# Patient Record
Sex: Female | Born: 1950 | Race: Black or African American | Hispanic: No | Marital: Single
Health system: Midwestern US, Community
[De-identification: ages and names within clinical notes are randomized; demographics above are authoritative.]

## PROBLEM LIST (undated history)

## (undated) DIAGNOSIS — M869 Osteomyelitis, unspecified: Principal | ICD-10-CM

## (undated) DIAGNOSIS — I2510 Atherosclerotic heart disease of native coronary artery without angina pectoris: Secondary | ICD-10-CM

## (undated) DIAGNOSIS — G459 Transient cerebral ischemic attack, unspecified: Secondary | ICD-10-CM

## (undated) DIAGNOSIS — E1140 Type 2 diabetes mellitus with diabetic neuropathy, unspecified: Secondary | ICD-10-CM

## (undated) DIAGNOSIS — T8859XA Other complications of anesthesia, initial encounter: Secondary | ICD-10-CM

## (undated) DIAGNOSIS — F419 Anxiety disorder, unspecified: Secondary | ICD-10-CM

## (undated) DIAGNOSIS — K649 Unspecified hemorrhoids: Secondary | ICD-10-CM

## (undated) DIAGNOSIS — H269 Unspecified cataract: Secondary | ICD-10-CM

## (undated) DIAGNOSIS — IMO0002 Reserved for concepts with insufficient information to code with codable children: Secondary | ICD-10-CM

## (undated) DIAGNOSIS — E785 Hyperlipidemia, unspecified: Secondary | ICD-10-CM

## (undated) DIAGNOSIS — T7840XA Allergy, unspecified, initial encounter: Secondary | ICD-10-CM

## (undated) DIAGNOSIS — T4145XA Adverse effect of unspecified anesthetic, initial encounter: Secondary | ICD-10-CM

## (undated) DIAGNOSIS — F32A Depression, unspecified: Secondary | ICD-10-CM

## (undated) DIAGNOSIS — J9809 Other diseases of bronchus, not elsewhere classified: Secondary | ICD-10-CM

## (undated) DIAGNOSIS — C8590 Non-Hodgkin lymphoma, unspecified, unspecified site: Secondary | ICD-10-CM

## (undated) DIAGNOSIS — K219 Gastro-esophageal reflux disease without esophagitis: Secondary | ICD-10-CM

## (undated) DIAGNOSIS — F329 Major depressive disorder, single episode, unspecified: Secondary | ICD-10-CM

## (undated) DIAGNOSIS — E119 Type 2 diabetes mellitus without complications: Secondary | ICD-10-CM

## (undated) DIAGNOSIS — E1121 Type 2 diabetes mellitus with diabetic nephropathy: Secondary | ICD-10-CM

## (undated) DIAGNOSIS — Z89432 Acquired absence of left foot: Secondary | ICD-10-CM

## (undated) DIAGNOSIS — I10 Essential (primary) hypertension: Secondary | ICD-10-CM

## (undated) DIAGNOSIS — G8929 Other chronic pain: Secondary | ICD-10-CM

## (undated) DIAGNOSIS — G8918 Other acute postprocedural pain: Secondary | ICD-10-CM

## (undated) DIAGNOSIS — M25562 Pain in left knee: Secondary | ICD-10-CM

## (undated) DIAGNOSIS — D497 Neoplasm of unspecified behavior of endocrine glands and other parts of nervous system: Secondary | ICD-10-CM

## (undated) DIAGNOSIS — I38 Endocarditis, valve unspecified: Secondary | ICD-10-CM

## (undated) HISTORY — PX: PLANTAR FASCIA RELEASE: SHX2239

## (undated) HISTORY — DX: Gastro-esophageal reflux disease without esophagitis: K21.9

## (undated) HISTORY — DX: Unspecified hemorrhoids: K64.9

## (undated) HISTORY — DX: Anxiety disorder, unspecified: F41.9

## (undated) HISTORY — DX: Hyperlipidemia, unspecified: E78.5

## (undated) HISTORY — PX: FOOT NEUROMA SURGERY: SHX646

## (undated) HISTORY — DX: Reserved for concepts with insufficient information to code with codable children: IMO0002

## (undated) HISTORY — PX: OTHER SURGICAL HISTORY: SHX169

## (undated) HISTORY — DX: Depression, unspecified: F32.A

## (undated) HISTORY — DX: Major depressive disorder, single episode, unspecified: F32.9

## (undated) HISTORY — DX: Allergy, unspecified, initial encounter: T78.40XA

## (undated) HISTORY — DX: Unspecified cataract: H26.9

## (undated) HISTORY — DX: Non-Hodgkin lymphoma, unspecified, unspecified site: C85.90

## (undated) HISTORY — PX: UPPER GI ENDOSCOPY: SHX6162

---
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
## 1991-09-20 HISTORY — PX: ATRIAL ABLATION SURGERY: SHX560

## 1995-09-20 HISTORY — PX: ATRIAL ABLATION SURGERY: SHX560

## 1997-11-14 ENCOUNTER — Ambulatory Visit (HOSPITAL_COMMUNITY): Admission: RE | Admit: 1997-11-14 | Discharge: 1997-11-14 | Payer: Self-pay | Admitting: Obstetrics and Gynecology

## 1998-12-03 ENCOUNTER — Other Ambulatory Visit: Admission: RE | Admit: 1998-12-03 | Discharge: 1998-12-03 | Payer: Self-pay | Admitting: Obstetrics and Gynecology

## 1999-12-03 ENCOUNTER — Other Ambulatory Visit: Admission: RE | Admit: 1999-12-03 | Discharge: 1999-12-03 | Payer: Self-pay | Admitting: Obstetrics and Gynecology

## 2000-01-06 ENCOUNTER — Encounter: Payer: Self-pay | Admitting: Obstetrics and Gynecology

## 2000-01-06 ENCOUNTER — Ambulatory Visit (HOSPITAL_COMMUNITY): Admission: RE | Admit: 2000-01-06 | Discharge: 2000-01-06 | Payer: Self-pay | Admitting: Obstetrics and Gynecology

## 2000-12-18 ENCOUNTER — Other Ambulatory Visit: Admission: RE | Admit: 2000-12-18 | Discharge: 2000-12-18 | Payer: Self-pay | Admitting: Obstetrics and Gynecology

## 2001-09-14 ENCOUNTER — Ambulatory Visit (HOSPITAL_COMMUNITY): Admission: RE | Admit: 2001-09-14 | Discharge: 2001-09-14 | Payer: Self-pay | Admitting: Obstetrics and Gynecology

## 2001-09-14 ENCOUNTER — Encounter: Payer: Self-pay | Admitting: Obstetrics and Gynecology

## 2001-12-24 ENCOUNTER — Other Ambulatory Visit: Admission: RE | Admit: 2001-12-24 | Discharge: 2001-12-24 | Payer: Self-pay | Admitting: Obstetrics and Gynecology

## 2002-09-18 ENCOUNTER — Encounter: Payer: Self-pay | Admitting: Obstetrics and Gynecology

## 2002-09-18 ENCOUNTER — Ambulatory Visit (HOSPITAL_COMMUNITY): Admission: RE | Admit: 2002-09-18 | Discharge: 2002-09-18 | Payer: Self-pay | Admitting: Obstetrics and Gynecology

## 2002-10-07 ENCOUNTER — Other Ambulatory Visit: Admission: RE | Admit: 2002-10-07 | Discharge: 2002-10-07 | Payer: Self-pay | Admitting: Obstetrics and Gynecology

## 2002-10-09 ENCOUNTER — Encounter: Payer: Self-pay | Admitting: Obstetrics and Gynecology

## 2002-10-09 ENCOUNTER — Encounter: Admission: RE | Admit: 2002-10-09 | Discharge: 2002-10-09 | Payer: Self-pay | Admitting: Obstetrics and Gynecology

## 2003-09-29 ENCOUNTER — Ambulatory Visit (HOSPITAL_COMMUNITY): Admission: RE | Admit: 2003-09-29 | Discharge: 2003-09-29 | Payer: Self-pay | Admitting: Obstetrics and Gynecology

## 2003-10-14 ENCOUNTER — Other Ambulatory Visit: Admission: RE | Admit: 2003-10-14 | Discharge: 2003-10-14 | Payer: Self-pay | Admitting: Obstetrics and Gynecology

## 2004-10-04 ENCOUNTER — Ambulatory Visit (HOSPITAL_COMMUNITY): Admission: RE | Admit: 2004-10-04 | Discharge: 2004-10-04 | Payer: Self-pay | Admitting: Obstetrics and Gynecology

## 2004-11-23 ENCOUNTER — Other Ambulatory Visit: Admission: RE | Admit: 2004-11-23 | Discharge: 2004-11-23 | Payer: Self-pay | Admitting: Obstetrics and Gynecology

## 2005-08-09 ENCOUNTER — Ambulatory Visit: Payer: Self-pay

## 2005-10-12 ENCOUNTER — Ambulatory Visit (HOSPITAL_COMMUNITY): Admission: RE | Admit: 2005-10-12 | Discharge: 2005-10-12 | Payer: Self-pay | Admitting: Obstetrics and Gynecology

## 2006-01-09 ENCOUNTER — Other Ambulatory Visit: Admission: RE | Admit: 2006-01-09 | Discharge: 2006-01-09 | Payer: Self-pay | Admitting: Obstetrics & Gynecology

## 2006-04-10 ENCOUNTER — Ambulatory Visit (HOSPITAL_COMMUNITY): Admission: RE | Admit: 2006-04-10 | Discharge: 2006-04-10 | Payer: Self-pay | Admitting: Internal Medicine

## 2006-04-17 ENCOUNTER — Ambulatory Visit (HOSPITAL_COMMUNITY): Admission: RE | Admit: 2006-04-17 | Discharge: 2006-04-17 | Payer: Self-pay | Admitting: Obstetrics and Gynecology

## 2006-05-04 ENCOUNTER — Ambulatory Visit (HOSPITAL_COMMUNITY): Admission: RE | Admit: 2006-05-04 | Discharge: 2006-05-04 | Payer: Self-pay | Admitting: Internal Medicine

## 2006-10-20 ENCOUNTER — Ambulatory Visit (HOSPITAL_COMMUNITY): Admission: RE | Admit: 2006-10-20 | Discharge: 2006-10-20 | Payer: Self-pay | Admitting: Obstetrics and Gynecology

## 2007-01-15 ENCOUNTER — Other Ambulatory Visit: Admission: RE | Admit: 2007-01-15 | Discharge: 2007-01-15 | Payer: Self-pay | Admitting: Obstetrics and Gynecology

## 2007-10-22 ENCOUNTER — Ambulatory Visit (HOSPITAL_COMMUNITY): Admission: RE | Admit: 2007-10-22 | Discharge: 2007-10-22 | Payer: Self-pay | Admitting: Obstetrics & Gynecology

## 2007-12-27 LAB — METABOLIC PANEL, COMPREHENSIVE
A-G Ratio: 1.6 (ref 1.1–2.2)
ALT (SGPT): 35 U/L (ref 30–65)
AST (SGOT): 14 U/L — ABNORMAL LOW (ref 15–37)
Albumin: 4.6 g/dL (ref 3.5–5.0)
Alk. phosphatase: 128 U/L (ref 50–136)
Anion gap: 7 mmol/L (ref 5–15)
BUN/Creatinine ratio: 14 (ref 12–20)
BUN: 10 MG/DL (ref 6–20)
Bilirubin, total: 0.3 MG/DL (ref <1.0)
CO2: 30 MMOL/L (ref 21–32)
Calcium: 9.5 MG/DL (ref 8.5–10.1)
Chloride: 105 MMOL/L (ref 97–108)
Creatinine: 0.7 MG/DL (ref 0.6–1.3)
GFR est AA: >60 mL/min/{1.73_m2} (ref >60)
GFR est non-AA: >60 mL/min/{1.73_m2} (ref >60)
Globulin: 2.8 g/dL (ref 2.0–4.0)
Glucose: 131 MG/DL — ABNORMAL HIGH (ref 50–100)
Potassium: 4.2 MMOL/L (ref 3.5–5.1)
Protein, total: 7.4 g/dL (ref 6.4–8.2)
Sodium: 142 MMOL/L (ref 136–145)

## 2007-12-27 LAB — LIPID PANEL
CHOL/HDL Ratio: 3.4 (ref 0–5.0)
Cholesterol, total: 215 MG/DL — ABNORMAL HIGH (ref <200)
HDL Cholesterol: 63 MG/DL — ABNORMAL HIGH (ref 40–60)
LDL, calculated: 133 MG/DL — ABNORMAL HIGH (ref 0–100)
Triglyceride: 95 MG/DL (ref 30–200)
VLDL, calculated: 19 MG/DL

## 2007-12-28 LAB — HEMOGLOBIN A1C WITH EAG: Hemoglobin A1c: 7.5 % — ABNORMAL HIGH (ref 4.2–5.8)

## 2008-02-12 ENCOUNTER — Other Ambulatory Visit: Admission: RE | Admit: 2008-02-12 | Discharge: 2008-02-12 | Payer: Self-pay | Admitting: Obstetrics and Gynecology

## 2008-11-12 MED ORDER — CYCLOBENZAPRINE 10 MG TAB
10 mg | ORAL_TABLET | Freq: Three times a day (TID) | ORAL | Status: AC | PRN
Start: 2008-11-12 — End: 2008-11-22

## 2008-11-12 MED ORDER — PROPOXYPHENE N-ACETAMINOPHEN 50 MG-325 MG TAB
50-325 mg | ORAL_TABLET | Freq: Four times a day (QID) | ORAL | Status: AC | PRN
Start: 2008-11-12 — End: 2008-12-12
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
## 2008-12-03 ENCOUNTER — Ambulatory Visit (HOSPITAL_COMMUNITY): Admission: RE | Admit: 2008-12-03 | Discharge: 2008-12-03 | Payer: Self-pay | Admitting: Internal Medicine
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
## 2008-12-10 NOTE — Progress Notes (Signed)
HISTORY OF PRESENT ILLNESS  Lindsey Huynh is a 58 y.o. female who presents back for follow up.Patient continues to have pain in her neck and back.Denies any new problems  HPI    ROS    Physical Exam   Nursing note and vitals reviewed.  Constitutional: She is oriented. She appears well-developed and well-nourished.   HENT:   Head: Normocephalic and atraumatic.   Right Ear: External ear normal.   Left Ear: External ear normal.   Eyes: Conjunctivae and extraocular motions are normal. Pupils are equal, round, and reactive to light.   Neck:        Moderate spasm over both trapezius   Cardiovascular: Normal rate, regular rhythm, normal heart sounds and intact distal pulses.  Exam reveals no gallop and no friction rub.    No murmur heard.  Pulmonary/Chest: Effort normal and breath sounds normal.   Abdominal: Soft. Bowel sounds are normal.   Musculoskeletal:        Moderate spasm lumbar-sacral area   Neurological: She is alert and oriented. She has normal reflexes.   Skin: Skin is warm.   Psychiatric: She has a normal mood and affect.       ASSESSMENT and PLAN  1. Cervical Strain (847.0P)    2. Lumbosacral Strain (846.25F)        Encounter Diagnoses   Code Name Primary?   ??? 847.0P Cervical Strain Yes   ??? 846.25F Lumbosacral Strain        No orders of the defined types were placed in this encounter.     Lindsey Huynh was seen today for follow-up.    Diagnoses and associated orders for this visit:    Cervical strain    Lumbosacral strain        Follow-up Disposition:  Return in about 2 weeks (around 12/24/2008).  reviewed medications and side effects in detail

## 2008-12-29 NOTE — Progress Notes (Signed)
HISTORY OF PRESENT ILLNESS  Lindsey Huynh is a 58 y.o. female. Who  Presents back for follow up.Contines with pain in neck and back.Pain radiates down the back of right leg.Still with pain right middle  Finger.Still in therapy.Denies any new problems  HPI    ROS    Physical Exam   Nursing note and vitals reviewed.  Constitutional: She is oriented. She appears well-developed.   HENT:   Head: Normocephalic and atraumatic.   Right Ear: External ear normal.   Left Ear: External ear normal.   Eyes: Conjunctivae and extraocular motions are normal. Pupils are equal, round, and reactive to light.   Neck:        Moderate spasm over both trapezius   Cardiovascular: Normal rate, normal heart sounds and intact distal pulses.  Exam reveals no gallop and no friction rub.    No murmur heard.  Pulmonary/Chest: Effort normal and breath sounds normal.   Abdominal: Soft. Bowel sounds are normal.   Musculoskeletal:        Moderate spasm lumbar -sacral area   Neurological: She is alert and oriented. She has normal reflexes.   Skin: Skin is warm and dry.       ASSESSMENT and PLAN  1. Cervical strain (847.0P)    2. Lumbar sprain and strain (847.2)        Encounter Diagnoses   Code Name Primary?   ??? 847.2 Lumbar sprain and strain Yes   ??? 847.0P Cervical strain        No orders of the defined types were placed in this encounter.     Lindsey Huynh was seen today for follow-up.    Diagnoses and associated orders for this visit:    Lumbar sprain and strain    Cervical strain        Follow-up Disposition:  Return in about 2 weeks (around 01/12/2009).  current treatment plan is effective, no change in therapy  reviewed medications and side effects in detail
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
## 2009-01-19 MED ORDER — HYDROCODONE-ACETAMINOPHEN 5 MG-500 MG TAB
5-500 mg | ORAL_TABLET | ORAL | Status: DC | PRN
Start: 2009-01-19 — End: 2010-05-21

## 2009-01-19 NOTE — Progress Notes (Signed)
HISTORY OF PRESENT ILLNESS  Lindsey Huynh is a 58 y.o. female who presents back for follow up,having increase pain in back radiating down right leg.Having to take more pain medication.Physical therapy is making it worst.Still with pain in neck,still with pain middle right finger.  HPI    Review of Systems   Constitutional: Negative.    HENT: Negative.    Eyes: Negative.    Respiratory: Negative.    Cardiovascular: Negative.    Gastrointestinal: Negative.    Genitourinary: Negative for dysuria, urgency, frequency, hematuria and flank pain.   Musculoskeletal: Negative.    Skin: Negative.    Neurological: Negative.    Endo/Heme/Allergies: Negative.    Psychiatric/Behavioral: Negative.        Physical Exam   Nursing note and vitals reviewed.  Constitutional: She is oriented. She appears well-developed and well-nourished.   HENT:   Head: Normocephalic and atraumatic.   Right Ear: External ear normal.   Left Ear: External ear normal.   Eyes: Conjunctivae and extraocular motions are normal. Pupils are equal, round, and reactive to light.   Neck:        Mild spasm over both trapezius   Cardiovascular: Normal rate, regular rhythm, normal heart sounds and intact distal pulses.  Exam reveals no gallop and no friction rub.    No murmur heard.  Pulmonary/Chest: Effort normal and breath sounds normal. No respiratory distress. She has no wheezes. She has no rales. She exhibits no tenderness.   Abdominal: Soft. Bowel sounds are normal.   Musculoskeletal:        Moderate spasm lumbar-sacral area,right middle finger swollen tender   Neurological: She is alert and oriented. She has normal reflexes.   Skin: Skin is warm and dry.   Psychiatric: She has a normal mood and affect.       ASSESSMENT and PLAN  1. Cervical strain (847.0P)    2. Lumbar sprain and strain (847.2)    3. Hand pain, right (729.5EZ)        Encounter Diagnoses   Code Name Primary?   ??? 847.2 Lumbar sprain and strain Yes   ??? 847.0P Cervical strain     ??? 729.5EZ Hand pain, right        Orders Placed This Encounter   ??? Hydrocodone-acetaminophen (lortab) 5-500 mg per tablet   ??? Cyclobenzaprine (flexeril) 5 mg tablet   ??? Hydrocodone-acetaminophen (lortab) 5-500 mg per tablet       Dee was seen today for leg pain.    Diagnoses and associated orders for this visit:    Lumbar sprain and strain    Cervical strain    Hand pain, right    Other Orders  - HYDROCODONE-ACETAMINOPHEN 5 MG-500 MG TAB; Take  by mouth.  - CYCLOBENZAPRINE 5 MG TAB; Take 5 mg by mouth three (3) times daily as needed.  - hydrocodone-acetaminophen (LORTAB) tablet 5-500 mgg; Take 1 Tab by mouth every four (4) hours as needed for Pain.        Follow-up Disposition:  Return in about 3 weeks (around 02/09/2009).  the following changes in treatment are made:D/C physical therapy until further notice  reviewed medications and side effects in detail
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
## 2009-03-25 NOTE — Telephone Encounter (Signed)
Patient walked in requesting refill of Lortab 5/500, still complain of lumbar discomfort.  Dr. Josephine Igo approved refill of Lortab 5/500 #30 no refill, take one by mouth every 6hrs as needed for pain.  Called into [facility] [phone]

## 2009-06-18 MED ORDER — AMLODIPINE 10 MG TAB
10 mg | ORAL_TABLET | Freq: Every day | ORAL | Status: DC
Start: 2009-06-18 — End: 2009-06-18

## 2009-06-18 MED ORDER — AMLODIPINE 10 MG TAB
10 mg | ORAL_TABLET | Freq: Every day | ORAL | Status: DC
Start: 2009-06-18 — End: 2009-06-25

## 2009-06-25 MED ORDER — AMLODIPINE 10 MG TAB
10 mg | ORAL_TABLET | Freq: Every day | ORAL | Status: DC
Start: 2009-06-25 — End: 2009-06-26

## 2009-06-25 NOTE — Telephone Encounter (Signed)
Pharmacy is requesting a refill for amlodipine besylate 10 mg tablet, take 1 tablet every day

## 2009-06-26 MED ORDER — AMLODIPINE 10 MG TAB
10 mg | ORAL_TABLET | Freq: Every day | ORAL | Status: DC
Start: 2009-06-26 — End: 2010-01-26

## 2009-08-21 MED ORDER — GLYBURIDE-METFORMIN 5 MG-500 MG TAB
5-500 mg | ORAL_TABLET | Freq: Every day | ORAL | Status: DC
Start: 2009-08-21 — End: 2009-08-24

## 2009-08-21 MED ORDER — AZITHROMYCIN 250 MG TAB
250 mg | ORAL_TABLET | ORAL | Status: AC
Start: 2009-08-21 — End: 2009-08-26
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
## 2009-08-24 MED ORDER — GLYBURIDE-METFORMIN 5 MG-500 MG TAB
5-500 mg | ORAL_TABLET | Freq: Two times a day (BID) | ORAL | Status: DC
Start: 2009-08-24 — End: 2010-06-22

## 2009-09-01 MED ORDER — VALSARTAN-HYDROCHLOROTHIAZIDE 160 MG-25 MG TAB
160-25 mg | ORAL_TABLET | Freq: Every day | ORAL | Status: DC
Start: 2009-09-01 — End: 2010-01-26
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
## 2009-10-31 LAB — METABOLIC PANEL, COMPREHENSIVE
A-G Ratio: 1.9 (ref 1.1–2.5)
ALT (SGPT): 25 IU/L (ref 0–40)
AST (SGOT): 21 IU/L (ref 0–40)
Albumin: 4.4 g/dL (ref 3.6–4.8)
Alk. phosphatase: 99 IU/L (ref 25–165)
BUN/Creatinine ratio: 15 (ref 11–26)
BUN: 11 mg/dL (ref 8–27)
Bilirubin, total: 0.2 mg/dL (ref 0.0–1.2)
CO2: 24 mmol/L (ref 20–32)
Calcium: 9.4 mg/dL (ref 8.6–10.2)
Chloride: 103 mmol/L (ref 97–108)
Creatinine: 0.73 mg/dL (ref 0.57–1.00)
GFR est AA: >59 mL/min/{1.73_m2} (ref >59)
GFR est non-AA: >59 mL/min/{1.73_m2} (ref >59)
GLOBULIN, TOTAL: 2.3 g/dL (ref 1.5–4.5)
Glucose: 147 mg/dL — ABNORMAL HIGH (ref 65–99)
Potassium: 3.9 mmol/L (ref 3.5–5.2)
Protein, total: 6.7 g/dL (ref 6.0–8.5)
Sodium: 139 mmol/L (ref 135–145)

## 2009-10-31 LAB — HEMOGLOBIN A1C WITH EAG: Hemoglobin A1c: 7.3 % — ABNORMAL HIGH (ref 4.8–5.6)

## 2009-10-31 LAB — LIPID PANEL
Cholesterol, total: 202 mg/dL — ABNORMAL HIGH (ref 100–199)
HDL Cholesterol: 63 mg/dL (ref >39)
LDL, calculated: 124 mg/dL — ABNORMAL HIGH (ref 0–99)
Triglyceride: 73 mg/dL (ref 0–149)
VLDL, calculated: 15 mg/dL (ref 5–40)

## 2010-01-26 MED ORDER — AMLODIPINE 10 MG TAB
10 mg | ORAL_TABLET | ORAL | Status: DC
Start: 2010-01-26 — End: 2010-05-16

## 2010-01-26 MED ORDER — VALSARTAN-HYDROCHLOROTHIAZIDE 160 MG-25 MG TAB
160-25 mg | ORAL_TABLET | Freq: Every day | ORAL | Status: DC
Start: 2010-01-26 — End: 2010-05-07

## 2010-02-27 LAB — HM PAP SMEAR

## 2010-03-17 ENCOUNTER — Ambulatory Visit (HOSPITAL_COMMUNITY): Admission: RE | Admit: 2010-03-17 | Discharge: 2010-03-17 | Payer: Self-pay | Admitting: Obstetrics and Gynecology

## 2010-05-07 MED ORDER — VALSARTAN-HYDROCHLOROTHIAZIDE 160 MG-25 MG TAB
160-25 mg | ORAL_TABLET | Freq: Every day | ORAL | Status: DC
Start: 2010-05-07 — End: 2010-05-21

## 2010-05-07 MED ORDER — OXYCODONE-ACETAMINOPHEN 5 MG-325 MG TAB
5-325 mg | ORAL_TABLET | ORAL | Status: DC | PRN
Start: 2010-05-07 — End: 2010-11-04

## 2010-05-07 MED ORDER — VALSARTAN-HYDROCHLOROTHIAZIDE 160 MG-25 MG TAB
160-25 mg | ORAL_TABLET | Freq: Every day | ORAL | Status: DC
Start: 2010-05-07 — End: 2012-12-10
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
## 2010-05-07 NOTE — Patient Instructions (Addendum)
English   Spanish  Arthritis: After Your Visit  Your Care Instructions  Arthritis, also called osteoarthritis, is a breakdown of the cartilage that cushions your joints. When the cartilage wears down, your bones rub against each other. This causes pain and stiffness. Many people have some arthritis as they age. Arthritis most often affects the joints of the spine, hands, hips, knees, or feet.  You can take simple measures to protect your joints, ease your pain, and help you stay active.  Follow-up care is a key part of your treatment and safety. Be sure to make and go to all appointments, and call your doctor if you are having problems. It???s also a good idea to know your test results and keep a list of the medicines you take.  How can you care for yourself at home?  ?? Stay at a healthy weight. Being overweight puts extra strain on your joints.   ?? Talk to your doctor or physical therapist about exercises that will help ease joint pain.   ?? Stretch. You may enjoy gentle forms of yoga to help keep your joints and muscles flexible.   ?? Walk instead of jog. Other types of exercise that are less stressful on the joints include riding a bicycle, swimming, or water exercise.   ?? Lift weights. Strong muscles help reduce stress on your joints. Stronger thigh muscles, for example, take some of the stress off of the knees and hips. Learn the right way to lift weights so you do not make joint pain worse.  ?? Take your medicines exactly as prescribed. Call your doctor if you think you are having a problem with your medicine.   ?? Take pain medicines exactly as directed.   ?? If the doctor gave you a prescription medicine for pain, take it as prescribed.   ?? If you are not taking a prescription pain medicine, ask your doctor if you can take an over-the-counter medicine.    ?? Do not take two or more pain medicines at the same time unless the doctor told you to. Many pain medicines have acetaminophen, which is Tylenol. Too much acetaminophen (Tylenol) can be harmful.  ?? Use a cane, crutch, walker, or another device if you need help to get around. These can help rest your joints. You also can use other things to make life easier, such as a higher toilet seat and padded handles on kitchen utensils.   ?? Do not sit in low chairs, which can make it hard to get up.   ?? Put heat or cold on your sore joints as needed. Use whichever helps you most. You also can take turns with hot and cold packs.   ?? Apply heat 2 or 3 times a day for 20 to 30 minutes???using a heating pad, hot shower, or hot pack???to relieve pain and stiffness.   ?? Put ice or a cold pack on your sore joint for 10 to 20 minutes at a time. Put a thin cloth between the ice and your skin.  ?? Your doctor may suggest you try the supplements glucosamine and chondroitin. They are part of what makes up normal cartilage. Tell your doctor if you have diabetes, allergies to shellfish, or if you take warfarin (Coumadin).  When should you call for help?  Call your doctor now or seek immediate medical care if:  ?? You have sudden swelling, warmth, or pain in any joint.   ?? You have joint pain and a fever or rash.   ??   You have such bad pain that you cannot use a joint.  Watch closely for changes in your health, and be sure to contact your doctor if:  ?? You have mild joint symptoms that continue even with more than 6 weeks of care at home.   ?? You have stomach pain or other problems with your medicine.     Where can you learn more?   Go to http://www.healthwise.net/[facility]  Enter Z807 in the search box to learn more about "Arthritis: After Your Visit."    ?? 2006-2011 [employer]. Care instructions adapted under license by [employer] (which disclaims liability or warranty for this information). This care instruction is for use with your licensed healthcare professional. If you have questions about a medical condition or this instruction, always ask your healthcare professional. Health[employer], Incorporated disclaims any warranty or liability for your use of this information.  Content Version: 8.9.83828; Last Revised: January 09, 2008

## 2010-05-17 MED ORDER — AMLODIPINE 10 MG TAB
10 mg | ORAL_TABLET | ORAL | Status: DC
Start: 2010-05-17 — End: 2010-07-18

## 2010-05-21 MED ORDER — HYDROCODONE-ACETAMINOPHEN 5 MG-325 MG TAB
5-325 mg | ORAL_TABLET | Freq: Four times a day (QID) | ORAL | Status: DC | PRN
Start: 2010-05-21 — End: 2010-11-04

## 2010-05-21 NOTE — Progress Notes (Signed)
Pt is here today for a follow up on a fall. Pt states she is still hurting.
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
## 2010-05-21 NOTE — Patient Instructions (Signed)
Hip Pain: After Your Visit  Your Care Instructions  Hip pain may be caused by many things, including overuse, a fall, or a twisting movement. Another cause of hip pain is arthritis. Your pain may increase when you stand up, walk, or squat. The pain may come and go or may be constant.  Home treatment can help relieve hip pain, swelling, and stiffness. If your pain is ongoing, you may need more tests and treatment.  Follow-up care is a key part of your treatment and safety. Be sure to make and go to all appointments, and call your doctor if you are having problems. It???s also a good idea to know your test results and keep a list of the medicines you take.  How can you care for yourself at home?  ?? Take pain medicines exactly as directed.   ?? If the doctor gave you a prescription medicine for pain, take it as prescribed.   ?? If you are not taking a prescription pain medicine, ask your doctor if you can take an over-the-counter medicine.  ?? Rest and protect your hip. Take a break from any activity, including standing or walking, that may cause pain.   ?? Put ice or a cold pack against your hip for 10 to 20 minutes at a time. Try to do this every 1 to 2 hours for the next 3 days (when you are awake) or until the swelling goes down. Put a thin cloth between the ice and your skin.   ?? Sleep on your healthy side with a pillow between your knees, or sleep on your back with pillows under your knees.   ?? If there is no swelling, you can put moist heat, a heating pad, or a warm cloth on your hip. Do gentle stretching exercises to help keep your hip flexible.   ?? Learn how to prevent falls. Have your vision and hearing checked regularly. Wear slippers or shoes with a nonskid sole.   ?? Stay at a healthy weight.   ?? Wear comfortable shoes.  When should you call for help?  Call 911 anytime you think you may need emergency care. For example, call if:  ?? You have sudden chest pain and shortness of breath, or you cough up blood.    ?? You are not able to stand or walk or bear weight.   ?? Your buttocks, legs, or feet feel numb or tingly.   ?? Your leg or foot is cool or pale or changes color.   ?? You have severe pain.  Call your doctor now or seek immediate medical care if:  ?? You have signs of infection, such as:   ?? Increased pain, swelling, warmth, or redness in the hip area.   ?? Red streaks leading from the hip area.   ?? Pus draining from the hip area.   ?? A fever.  ?? You have signs of a blood clot, such as:   ?? Pain in your calf, back of the knee, thigh, or groin.   ?? Redness and swelling in your leg or groin.  ?? You are not able to bend, straighten, or move your leg normally.   ?? You have trouble urinating or having bowel movements.  Watch closely for changes in your health, and be sure to contact your doctor if:  ?? You do not get better as expected.    Where can you learn more?   Go to http://www.healthwise.net/[facility]  Enter Z720 in the search box   to learn more about "Hip Pain: After Your Visit."   ?? 2006-2011 Healthwise, Inc[employer]. Care instructions adapted under license by [place] (which disclaims liability or warranty for this information). This care instruction is for use with your licensed healthcare professional. If you have questions about a medical condition or this instruction, always ask your healthcare professional. Healthwise, Incorporated disclaims any warranty or liability for your use of this information.  Content Version: 9.0.56994; Last Revised: December 10, 2009

## 2010-06-17 LAB — CREATININE, POC
Creatinine (POC): 0.6 MG/DL (ref 0.6–1.3)
GFRAA, POC: >60 mL/min/{1.73_m2} (ref >60)
GFRNA, POC: >60 mL/min/{1.73_m2} (ref >60)

## 2010-06-17 MED ORDER — GADOPENTETATE DIMEGLUMINE 10 MMOL/20 ML (469.01 MG/ML) IV
10 mmol/20 mL (469.01 mg/mL) | Freq: Once | INTRAVENOUS | Status: AC
Start: 2010-06-17 — End: 2010-06-17
  Administered 2010-06-17: via INTRAVENOUS

## 2010-06-22 MED ORDER — GLYBURIDE-METFORMIN 5 MG-500 MG TAB
5-500 mg | ORAL_TABLET | Freq: Two times a day (BID) | ORAL | Status: DC
Start: 2010-06-22 — End: 2011-02-06

## 2010-07-19 MED ORDER — AMLODIPINE 10 MG TAB
10 mg | ORAL_TABLET | ORAL | Status: DC
Start: 2010-07-19 — End: 2010-09-15

## 2010-09-15 MED ORDER — AMLODIPINE 10 MG TAB
10 mg | ORAL_TABLET | ORAL | Status: DC
Start: 2010-09-15 — End: 2011-02-06

## 2010-10-07 LAB — CREATININE, POC
Creatinine (POC): 0.7 MG/DL (ref 0.6–1.3)
GFRAA, POC: >60 mL/min/{1.73_m2} (ref >60)
GFRNA, POC: >60 mL/min/{1.73_m2} (ref >60)

## 2010-10-07 MED ORDER — GADOPENTETATE DIMEGLUMINE 10 MMOL/20 ML (469.01 MG/ML) IV
10 mmol/20 mL (469.01 mg/mL) | Freq: Once | INTRAVENOUS | Status: AC
Start: 2010-10-07 — End: 2010-10-07
  Administered 2010-10-07: 16:00:00 via INTRAVENOUS

## 2010-10-07 MED ORDER — SODIUM CHLORIDE 0.9 % IJ SYRG
Freq: Once | INTRAMUSCULAR | Status: AC
Start: 2010-10-07 — End: 2010-10-07
  Administered 2010-10-07: 16:00:00 via INTRAVENOUS

## 2010-10-07 MED FILL — MONOJECT PREFILL ADVANCED 0.9 % SODIUM CHLORIDE INJECTION SYRINGE: INTRAMUSCULAR | Qty: 10

## 2010-10-07 MED FILL — MAGNEVIST 10 MMOL/20 ML (469.01 MG/ML) INTRAVENOUS SOLUTION: 10 mmol/20 mL (469.01 mg/mL) | INTRAVENOUS | Qty: 20

## 2010-10-15 MED ORDER — VALSARTAN-HYDROCHLOROTHIAZIDE 160 MG-25 MG TAB
160-25 mg | ORAL_TABLET | ORAL | Status: DC
Start: 2010-10-15 — End: 2010-11-04

## 2010-11-04 MED ORDER — VALSARTAN-HYDROCHLOROTHIAZIDE 320 MG-12.5 MG TAB
ORAL_TABLET | Freq: Every day | ORAL | Status: DC
Start: 2010-11-04 — End: 2011-11-15
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
## 2010-11-04 NOTE — Progress Notes (Signed)
Pt presents for f/u.

## 2010-11-04 NOTE — Progress Notes (Signed)
Addended by: Arlyn Leak on: 11/04/2010 09:19 AM     Modules accepted: Orders

## 2010-11-04 NOTE — Patient Instructions (Addendum)
DASH Diet: After Your Visit  Your Care Instructions  The DASH diet is an eating plan that can help lower your blood pressure. DASH stands for Dietary Approaches to Stop Hypertension. Hypertension is high blood pressure.  The DASH diet focuses on eating foods that are high in calcium, potassium, and magnesium. These nutrients can lower blood pressure. The foods that are highest in these nutrients are fruits, vegetables, low-fat dairy products, nuts, seeds, and legumes. But taking calcium, potassium, and magnesium supplements instead of eating foods that are high in those nutrients does not have the same effect. The DASH diet also includes whole grains, fish, and poultry.  The DASH diet is one of several lifestyle changes your doctor may recommend to lower your high blood pressure. Your doctor may also want you to decrease the amount of sodium in your diet. Lowering sodium while following the DASH diet can lower blood pressure even further than just the DASH diet alone.  Follow-up care is a key part of your treatment and safety. Be sure to make and go to all appointments, and call your doctor if you are having problems. It???s also a good idea to know your test results and keep a list of the medicines you take.  How can you care for yourself at home?  Following the DASH diet  ?? Eat 4 to 5 servings of fruit each day. A serving is 1 medium-sized piece of fruit, 1/2 cup chopped or canned fruit, 1/4 cup dried fruit, or 4 ounces (1/2 cup) of fruit juice. Choose fruit more often than fruit juice.   ?? Eat 4 to 5 servings of vegetables each day. A serving is 1 cup of lettuce or raw leafy vegetables, 1/2 cup of chopped or cooked vegetables, or 4 ounces (1/2 cup) of vegetable juice. Choose vegetables more often than vegetable juice.   ?? Get 3 servings of low-fat dairy each day. A serving is 8 ounces of milk, 1 cup of yogurt, or 1 1/2 ounces of cheese.    ?? Eat 7 to 8 servings of grains each day. A serving is 1 slice of bread, 1 ounce of dry cereal, or 1/2 cup of cooked rice, pasta, or cooked cereal. Try to choose whole-grain products as much as possible.   ?? Limit lean meat, poultry, and fish to 6 ounces each day. Six ounces is about the size of two decks of cards.   ?? Eat 4 to 5 servings of nuts, seeds, and legumes (cooked dried beans, lentils, and split peas) each week. A serving is 1/3 cup of nuts, 2 tablespoons of seeds, or 1/2 cup cooked dried beans or peas.   Tips for success  ?? Start small. Do not try to make dramatic changes to your diet all at once. You might feel that you are missing out on your favorite foods and then be more likely to not follow the plan. Make small changes, and stick with them. Once those changes become habit, add a few more changes.   ?? Try some of the following:   ?? Make it a goal to eat a fruit or vegetable at every meal and at snacks. This will make it easy to get the recommended amount of fruits and vegetables each day.   ?? Try yogurt topped with fruit and nuts for a snack or healthy dessert.   ?? Add lettuce, tomato, cucumber, and onion to sandwiches.   ?? Combine a ready-made pizza crust with low-fat mozzarella cheese and lots of   vegetable toppings. Try using tomatoes, squash, spinach, broccoli, carrots, cauliflower, and onions.   ?? Have a variety of cut-up vegetables with a low-fat dip as an appetizer instead of chips and dip.   ?? Sprinkle sunflower seeds or chopped almonds over salads. Or try adding chopped walnuts or almonds to cooked vegetables.   ?? Try some vegetarian meals using beans and peas. Add garbanzo or kidney beans to salads. Make burritos and tacos with mashed pinto beans or black beans.     Where can you learn more?    Go to http://www.healthwise.net/[facility]   Enter H967 in the search box to learn more about "DASH Diet: After Your Visit."     ?? 2006-2011 Healthwise, Inc[employer]. Care instructions adapted under license by [place] (which disclaims liability or warranty for this information). This care instruction is for use with your licensed healthcare professional. If you have questions about a medical condition or this instruction, always ask your healthcare professional. Healthwise, Incorporated disclaims any warranty or liability for your use of this information.  Content Version: 9.1.125182; Last Revised: December 10, 2008

## 2010-11-05 LAB — METABOLIC PANEL, COMPREHENSIVE
A-G Ratio: 2 (ref 1.1–2.5)
ALT (SGPT): 17 IU/L (ref 0–40)
AST (SGOT): 15 IU/L (ref 0–40)
Albumin: 4.5 g/dL (ref 3.6–4.8)
Alk. phosphatase: 122 IU/L (ref 25–165)
BUN/Creatinine ratio: 22 (ref 11–26)
BUN: 14 mg/dL (ref 8–27)
Bilirubin, total: 0.3 mg/dL (ref 0.0–1.2)
CO2: 23 mmol/L (ref 20–32)
Calcium: 10 mg/dL (ref 8.6–10.2)
Chloride: 102 mmol/L (ref 97–108)
Creatinine: 0.65 mg/dL (ref 0.57–1.00)
GFR est AA: 111 mL/min/{1.73_m2} (ref >59)
GFR est non-AA: 96 mL/min/{1.73_m2} (ref >59)
GLOBULIN, TOTAL: 2.2 g/dL (ref 1.5–4.5)
Glucose: 201 mg/dL — ABNORMAL HIGH (ref 65–99)
Potassium: 4.1 mmol/L (ref 3.5–5.2)
Protein, total: 6.7 g/dL (ref 6.0–8.5)
Sodium: 139 mmol/L (ref 134–144)

## 2010-11-05 LAB — LIPID PANEL
Cholesterol, total: 202 mg/dL — ABNORMAL HIGH (ref 100–199)
HDL Cholesterol: 56 mg/dL (ref >39)
LDL, calculated: 130 mg/dL — ABNORMAL HIGH (ref 0–99)
Triglyceride: 81 mg/dL (ref 0–149)
VLDL, calculated: 16 mg/dL (ref 5–40)

## 2010-11-05 LAB — HEMOGLOBIN A1C WITH EAG: Hemoglobin A1c: 7.5 % — ABNORMAL HIGH (ref 4.8–5.6)

## 2010-11-15 NOTE — Telephone Encounter (Signed)
The patient is calling to request a return call from [facility], no message given she can be reached at [phone].

## 2010-11-16 NOTE — Telephone Encounter (Signed)
Returned patient phone.

## 2011-02-07 MED ORDER — GLYBURIDE-METFORMIN 5 MG-500 MG TAB
5-500 mg | ORAL_TABLET | ORAL | Status: DC
Start: 2011-02-07 — End: 2012-06-04

## 2011-02-07 MED ORDER — AMLODIPINE 10 MG TAB
10 mg | ORAL_TABLET | ORAL | Status: DC
Start: 2011-02-07 — End: 2011-09-10

## 2011-04-04 ENCOUNTER — Other Ambulatory Visit: Payer: Self-pay | Admitting: Obstetrics and Gynecology

## 2011-04-04 DIAGNOSIS — Z1231 Encounter for screening mammogram for malignant neoplasm of breast: Secondary | ICD-10-CM

## 2011-04-18 ENCOUNTER — Ambulatory Visit (HOSPITAL_COMMUNITY)
Admission: RE | Admit: 2011-04-18 | Discharge: 2011-04-18 | Disposition: A | Discharge status: Home / Self Care | Payer: Managed Care, Other (non HMO) | Source: Ambulatory Visit | Attending: Obstetrics and Gynecology | Admitting: Obstetrics and Gynecology

## 2011-04-18 DIAGNOSIS — Z1231 Encounter for screening mammogram for malignant neoplasm of breast: Secondary | ICD-10-CM | POA: Insufficient documentation

## 2011-06-08 ENCOUNTER — Encounter: Payer: Self-pay | Admitting: Internal Medicine

## 2011-06-14 ENCOUNTER — Ambulatory Visit (AMBULATORY_SURGERY_CENTER): Payer: Managed Care, Other (non HMO) | Admitting: *Deleted

## 2011-06-14 VITALS — Ht 64.0 in | Wt 155.0 lb

## 2011-06-14 DIAGNOSIS — Z1211 Encounter for screening for malignant neoplasm of colon: Secondary | ICD-10-CM

## 2011-06-14 MED ORDER — PEG-KCL-NACL-NASULF-NA ASC-C 100 G PO SOLR: ORAL | Status: DC

## 2011-06-23 ENCOUNTER — Other Ambulatory Visit: Payer: Managed Care, Other (non HMO) | Admitting: Internal Medicine

## 2011-07-11 ENCOUNTER — Other Ambulatory Visit: Payer: Managed Care, Other (non HMO)

## 2011-07-19 ENCOUNTER — Encounter: Payer: Self-pay | Admitting: Internal Medicine

## 2011-07-19 ENCOUNTER — Ambulatory Visit (AMBULATORY_SURGERY_CENTER): Payer: Managed Care, Other (non HMO) | Admitting: Internal Medicine

## 2011-07-19 VITALS — BP 129/68 | HR 75 | Temp 98.6°F | Resp 17 | Ht 64.0 in | Wt 155.0 lb

## 2011-07-19 DIAGNOSIS — Z1211 Encounter for screening for malignant neoplasm of colon: Secondary | ICD-10-CM

## 2011-07-19 MED ORDER — SODIUM CHLORIDE 0.9 % IV SOLN: 500.0000 mL | INTRAVENOUS | Status: DC

## 2011-07-19 MED ORDER — HYDROCORTISONE ACETATE 25 MG RE SUPP: 25.0000 mg | Freq: Every evening | RECTAL | Status: AC | PRN

## 2011-07-19 NOTE — Progress Notes (Signed)
Pressure applied to the abdomen to recach cecum

## 2011-07-19 NOTE — Patient Instructions (Signed)
NO POLYPS  INTERNAL HEMORRHOIDS  ANUSOL PRESCRIPTION SENT TO [facility].  USE FOR HEMORRHOIDAL DISCOMFORT.  USE MIRALAX  3 TIMES/ WEEK  SEE BLUE AND GREEN SHEETS FOR ADDITIONAL D/C INSTRUCTIONS

## 2011-07-20 ENCOUNTER — Telehealth: Payer: Self-pay | Admitting: *Deleted

## 2011-07-20 NOTE — Telephone Encounter (Signed)

## 2011-09-09 ENCOUNTER — Encounter: Payer: Self-pay | Admitting: Internal Medicine

## 2011-09-12 MED ORDER — AMLODIPINE 10 MG TAB
10 mg | ORAL_TABLET | ORAL | Status: DC
Start: 2011-09-12 — End: 2011-11-18

## 2011-10-10 ENCOUNTER — Encounter: Payer: Self-pay | Admitting: *Deleted

## 2011-10-11 ENCOUNTER — Encounter: Payer: Self-pay | Admitting: *Deleted

## 2011-10-12 MED ORDER — FLUCONAZOLE 150 MG TAB
150 mg | ORAL_TABLET | Freq: Every day | ORAL | Status: AC
Start: 2011-10-12 — End: 2011-10-13

## 2011-10-12 NOTE — Progress Notes (Signed)
HISTORY OF PRESENT ILLNESS  Lindsey Huynh is a 61 y.o. female who presents for follow up.patient states she fell outside [place] on Friday after slipping on some ice.She was seen the follow ing day at Patient Lindsey Huynh being performed that did not demonstrate  any fractured bones.Patient complaints today of lower back pain,pain right side of her buttocks,and right shoulder pain.She was given a muscle relaxer,pain medication and ibuprofen.None of which seems to be helping much.Patient also has a history of diabetes mellitus and states her blood sugars have been between 120 and 150.She has been checking her blood pressure as well,and that has been normal.  HPI    Review of Systems   Constitutional: Negative for fever, chills and weight loss.   HENT: Negative.    Eyes: Negative.    Respiratory: Negative.    Cardiovascular: Negative for chest pain and leg swelling.   Gastrointestinal: Negative.    Genitourinary: Negative.    Musculoskeletal: Positive for back pain and joint pain.   Skin: Negative.    Neurological: Negative.    Endo/Heme/Allergies: Negative.    Psychiatric/Behavioral: Negative.        Physical Exam   Nursing note and vitals reviewed.  Constitutional: She appears well-developed and well-nourished.   HENT:   Head: Normocephalic and atraumatic.   Eyes: Conjunctivae and EOM are normal. Pupils are equal, round, and reactive to light.   Neck: No thyromegaly present.        Tender over right trapezius,mild spasm   Cardiovascular: Normal rate, regular rhythm, normal heart sounds and intact distal pulses.  Exam reveals no gallop and no friction rub.    No murmur heard.  Pulmonary/Chest: Effort normal and breath sounds normal.   Abdominal: Soft. Bowel sounds are normal.   Musculoskeletal: She exhibits no edema.        Tender lumbar sacral area   Neurological: She is alert.   Skin: Skin is warm.   Psychiatric: She has a normal mood and affect.       ASSESSMENT and PLAN  1. Hypertension  METABOLIC  PANEL, COMPREHENSIVE, LIPID PANEL   2. Diabetes mellitus  HEMOGLOBIN A1C   3. Lumbar strain     4. Shoulder strain     5. GERD (gastroesophageal reflux disease)       Encounter Diagnoses   Name Primary?   ??? Hypertension Yes   ??? Diabetes mellitus    ??? Lumbar strain    ??? Shoulder strain    ??? GERD (gastroesophageal reflux disease)      Orders Placed This Encounter   ??? HEMOGLOBIN A1C   ??? METABOLIC PANEL, COMPREHENSIVE   ??? LIPID PANEL   ??? fluconazole (DIFLUCAN) 150 mg tablet     Lindsey Huynh was seen today for other and fall.    Diagnoses and associated orders for this visit:    Hypertension  - METABOLIC PANEL, COMPREHENSIVE  - LIPID PANEL    Diabetes mellitus  - HEMOGLOBIN A1C    Lumbar strain    Shoulder strain    Lindsey Huynh (gastroesophageal reflux disease)    Other Orders  - fluconazole (DIFLUCAN) 150 mg tablet; Take 1 Tab by mouth daily for 1 day.        Follow-up Disposition:  Return in about 1 month (around 11/12/2011).  lab results and schedule of future lab studies reviewed with patient  reviewed diet, exercise and weight control  reviewed medications and side effects in detail  specific diabetic recommendations: low cholesterol diet, weight control  and daily exercise discussed and home glucose monitoring emphasized  use of aspirin to prevent MI and TIA's discussed  Assessment/ Plan:     1. Hypertension  METABOLIC PANEL, COMPREHENSIVE, LIPID PANEL   2. Diabetes mellitus  HEMOGLOBIN A1C   3. Lumbar strain     4. Shoulder strain     5. GERD (gastroesophageal reflux disease)         I have discussed the diagnosis with the patient and the intended plan as seen in the above orders.  The patient has received an after-visit summary along with patient information handout.  I have discussed medication side effects and warnings with the patient as well.    Follow-up Disposition:  Return in about 1 month (around 11/12/2011).

## 2011-10-12 NOTE — Patient Instructions (Signed)
Back Strain: After Your Visit  Your Care Instructions  Back strain happens when you overstretch, or pull, a muscle in your back. You may hurt your back in an accident or when you exercise or lift something.  Most back pain will get better with rest and time. You can take care of yourself at home to help your back heal.  Follow-up care is a key part of your treatment and safety. Be sure to make and go to all appointments, and call your doctor if you are having problems. It???s also a good idea to know your test results and keep a list of the medicines you take.  How can you care for yourself at home?  ?? Try to stay as active as you can, but stop or reduce any activity that causes pain.   ?? Put ice or a cold pack on the sore muscle for 10 to 20 minutes at a time to stop swelling. Try this every 1 to 2 hours for 3 days (when you are awake) or until the swelling goes down. Put a thin cloth between the ice pack and your skin.   ?? After 2 or 3 days, apply a heating pad on low or a warm cloth to your back. Some doctors suggest that you go back and forth between hot and cold treatments.   ?? Take pain medicines exactly as directed.   ?? If the doctor gave you a prescription medicine for pain, take it as prescribed.   ?? If you are not taking a prescription pain medicine, ask your doctor if you can take an over-the-counter medicine.   ?? Try sleeping on your side with a pillow between your legs. Or put a pillow under your knees when you lie on your back. These measures can ease pain in your lower back.   ?? Return to your usual level of activity slowly.   When should you call for help?  Call 911 anytime you think you may need emergency care. For example, call if:  ?? You lose bladder or bowel control.   ?? You suddenly cannot walk or stand.   ?? You have sudden numbness or weakness in both legs.   Call your doctor now or seek immediate medical care if:  ?? Your pain is worse.   ?? You have new pain, numbness, tingling, or weakness,  especially in the buttocks, genital or rectal area, legs, or feet.   ?? You have symptoms of a urinary infection. For example:   ?? You have blood or pus in your urine.   ?? You have pain in your back just below your rib cage. This is called flank pain.   ?? You have a fever, chills, or body aches.   ?? It hurts to urinate.   ?? You have groin or belly pain.   Watch closely for changes in your health, and be sure to contact your doctor if:  ?? Your pain and swelling do not start to get better after 2 days of home treatment.     Where can you learn more?    Go to http://www.healthwise.net/[facility]   Enter U095 in the search box to learn more about "Back Strain: After Your Visit."    ?? 2006-2012 Healthwise, Incorporated. Care instructions adapted under license by [name] (which disclaims liability or warranty for this information). This care instruction is for use with your licensed healthcare professional. If you have questions about a medical condition or this instruction, always ask   your healthcare professional. Healthwise, Incorporated disclaims any warranty or liability for your use of this information.  Content Version: 9.5.76532; Last Revised: October 27, 2009

## 2011-10-12 NOTE — Progress Notes (Signed)
Patient in for follow up for management of diabetes and hypertension.  Patient also is follow ing from visit from [facility] for a fall she had 10-07-11 at a place of bussiness.

## 2011-10-13 ENCOUNTER — Encounter: Payer: Self-pay | Admitting: Internal Medicine

## 2011-10-13 LAB — HEMOGLOBIN A1C WITH EAG: Hemoglobin A1c: 12.9 % — ABNORMAL HIGH (ref 4.8–5.6)

## 2011-10-13 LAB — METABOLIC PANEL, COMPREHENSIVE
A-G Ratio: 2 (ref 1.1–2.5)
ALT (SGPT): 14 IU/L (ref 0–32)
AST (SGOT): 9 IU/L (ref 0–40)
Albumin: 4.6 g/dL (ref 3.6–4.8)
Alk. phosphatase: 143 IU/L (ref 25–165)
BUN/Creatinine ratio: 19 (ref 11–26)
BUN: 13 mg/dL (ref 8–27)
Bilirubin, total: 0.2 mg/dL (ref 0.0–1.2)
CO2: 23 mmol/L (ref 20–32)
Calcium: 10 mg/dL (ref 8.6–10.2)
Chloride: 96 mmol/L — ABNORMAL LOW (ref 97–108)
Creatinine: 0.67 mg/dL (ref 0.57–1.00)
GFR est non-AA: 95 mL/min/{1.73_m2} (ref >59)
GLOBULIN, TOTAL: 2.3 g/dL (ref 1.5–4.5)
Glucose: 317 mg/dL — ABNORMAL HIGH (ref 65–99)
Potassium: 4.3 mmol/L (ref 3.5–5.2)
Protein, total: 6.9 g/dL (ref 6.0–8.5)
Sodium: 137 mmol/L (ref 134–144)
eGFR If African American: 109 mL/min/{1.73_m2} (ref >59)

## 2011-10-13 LAB — LIPID PANEL
Cholesterol, total: 261 mg/dL — ABNORMAL HIGH (ref 100–199)
HDL Cholesterol: 63 mg/dL (ref >39)
LDL, calculated: 157 mg/dL — ABNORMAL HIGH (ref 0–99)
Triglyceride: 203 mg/dL — ABNORMAL HIGH (ref 0–149)
VLDL, calculated: 41 mg/dL — ABNORMAL HIGH (ref 5–40)

## 2011-10-14 ENCOUNTER — Ambulatory Visit (INDEPENDENT_AMBULATORY_CARE_PROVIDER_SITE_OTHER): Payer: BC Managed Care – PPO | Admitting: Internal Medicine

## 2011-10-14 ENCOUNTER — Encounter: Payer: Self-pay | Admitting: Internal Medicine

## 2011-10-14 VITALS — BP 108/60 | HR 75 | Ht 63.75 in | Wt 156.8 lb

## 2011-10-14 DIAGNOSIS — J3089 Other allergic rhinitis: Secondary | ICD-10-CM

## 2011-10-14 DIAGNOSIS — J329 Chronic sinusitis, unspecified: Secondary | ICD-10-CM

## 2011-10-14 DIAGNOSIS — J45909 Unspecified asthma, uncomplicated: Secondary | ICD-10-CM

## 2011-10-14 DIAGNOSIS — K219 Gastro-esophageal reflux disease without esophagitis: Secondary | ICD-10-CM

## 2011-10-14 NOTE — Patient Instructions (Signed)
Consider raising the head of your bed the level of a brick to reduce the chance of mild gastric reflux during the night  Discuss with Dr Juanda Chance the suspicion that reflux may be part of your cough cycle.   Consider the dust and environmental allergen control measures we discussed for your father's home- especially mold remediation, HEPA air filters, dust mite encasings on mattresses and pillows in rooms where you sleep.    Order- limited C T of sinuses- dx chronic sinusitis  Sample Tudorza  - 1 puff  Twice  Daily     You can use this right on top of the Symbicort if you wish  Consider exploring the role of Symbicort by reducing to 1 puff twice daily for a few weeks of observation.

## 2011-10-14 NOTE — Progress Notes (Signed)
10/14/11- 43 yoF never smoker referred by Dr Larina Earthly because of persistent cough with hoarseness. She says this has been going on for several months intermittently after onset in the spring of 2012. She has a history of chronic bronchitis lingering after colds. She has been going back and forth to [place] to stay at her father's house. She describes that house has moldy and dusty. Every time she goes, she returns with bronchitis and laryngitis. She will clear for a week or so in between but started all over again with repeated exposure. She also had a flulike illness and was bedridden at Thanksgiving. She took a Z-Pak with prednisone then. Laryngitis varies with cough but has been worse recently. A sister also gets significant rhinitis and chest congestion staying at that house, as the sisters rotate responsibility for 51 year old father who won't leave the house. Dr. [place] Callas is her allergist, on weekly allergy shots against, and inhalant allergens.. Cough has been productive at times, mainly when she had the flu, and this still occasionally productive of some yellow sputum. She is short of breath she does not use her inhalers including Symbicort over the past year. She takes Paxil and for history of mild reflux. She denies nighttime cough. There is a chronic history of intermittent asthma with some sinusitis and previous pneumonia. Medical history includes cardiac ablation in 1993. No ENT surgery. 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  CXR 09/2010- was negative. Family hx + allergies, asthma, heart disease.   ROS-see HPI Constitutional:   No-   weight loss, night sweats, fevers, chills, fatigue, lassitude. HEENT:   No-  headaches, difficulty swallowing, tooth/dental problems, sore throat,       No-  sneezing, itching, ear ache, nasal congestion, post nasal drip,  CV:  No-   chest pain, orthopnea, PND, swelling in lower extremities, anasarca, dizziness,  palpitations Resp: Little  shortness of breath with exertion or at rest.              No-   productive cough,  + non-productive cough,  No- coughing up of blood.              No-   change in color of mucus.  + wheezing.   Skin: No-   rash or lesions. GI:  No-   heartburn, indigestion, abdominal pain, nausea, vomiting, diarrhea,                 change in bowel habits, loss of appetite GU:  MS:  No-   joint pain or swelling.  No- decreased range of motion.  No- back pain. Neuro-     nothing unusual Psych:  No- change in mood or affect. No depression or anxiety.  No memory loss.  OBJ General- Alert, Oriented, Affect-appropriate, Distress- none acute Skin- rash-none, lesions- none, excoriation- none Lymphadenopathy- none Head- atraumatic            Eyes- Gross vision intact, PERRLA, conjunctivae clear secretions            Ears- Hearing, canals-normal            Nose- frequent sniffing/ watery, no-Septal dev, mucus, polyps, erosion, perforation             Throat- Mallampati II , +mucosa mild red cobblestone , drainage- none, tonsils- atrophic, + hoarse Neck- flexible , trachea midline, no stridor , thyroid nl, carotid no bruit Chest - symmetrical excursion , unlabored  Heart/CV- RRR , no murmur , no gallop  , no rub, nl s1 s2                           - JVD- none , edema- none, stasis changes- none, varices- none           Lung- clear to P&A, wheeze- none, cough- none , dullness-none, rub- none           Chest wall-  Abd- tender-no, distended-no, bowel sounds-present, HSM- no Br/ Gen/ Rectal- Not done, not indicated Extrem- cyanosis- none, clubbing, none, atrophy- none, strength- nl.+ Brace on left ankle. Neuro- grossly intact to observation

## 2011-10-16 DIAGNOSIS — J45909 Unspecified asthma, uncomplicated: Secondary | ICD-10-CM | POA: Insufficient documentation

## 2011-10-16 DIAGNOSIS — K219 Gastro-esophageal reflux disease without esophagitis: Secondary | ICD-10-CM | POA: Insufficient documentation

## 2011-10-16 DIAGNOSIS — J302 Other seasonal allergic rhinitis: Secondary | ICD-10-CM | POA: Insufficient documentation

## 2011-10-16 NOTE — Assessment & Plan Note (Signed)
Reflux precautions were reviewed. 

## 2011-10-16 NOTE — Assessment & Plan Note (Signed)
Allergic asthma strongly associated with trips to stay at her father's home, described as dusty and mold and. She starts to get better between trips. A sister has a similar experience when it is her current dyspnea and at home. There is probably a cyclical cough component, with partial esophageal reflux induced by her asthma. Plan: We discussed ways to address mold and dust and father's home including ringing in renovation consultants, use of air cleaners etc. I would let her try a sample of Tudorza to see if it has any benefit. We discussed reflux precautions.

## 2011-10-16 NOTE — Assessment & Plan Note (Signed)
Probable environmental sensitization to dust and mold associated with her father's home. She does have a history of asthma and bronchitis.

## 2011-10-18 ENCOUNTER — Encounter: Payer: Self-pay | Admitting: Internal Medicine

## 2011-10-18 ENCOUNTER — Ambulatory Visit (INDEPENDENT_AMBULATORY_CARE_PROVIDER_SITE_OTHER): Payer: BC Managed Care – PPO | Admitting: Internal Medicine

## 2011-10-18 VITALS — BP 102/62 | HR 80 | Ht 64.0 in | Wt 157.0 lb

## 2011-10-18 DIAGNOSIS — K219 Gastro-esophageal reflux disease without esophagitis: Secondary | ICD-10-CM

## 2011-10-18 MED ORDER — DEXLANSOPRAZOLE 60 MG PO CPDR: 60.0000 mg | DELAYED_RELEASE_CAPSULE | Freq: Two times a day (BID) | ORAL | Status: DC

## 2011-10-18 NOTE — Progress Notes (Signed)
Alyssa Elliott [birth year] MRN [id number]   History of Present Illness:  This is a 61 year old white female with a one-year history of cough, bronchitis and hoarseness. She was evaluated by Dr. Maple Hudson and she is here today to rule out the possibility of chronic gastroesophageal reflux and LPR causing her symptoms. She has been on Dexilant 60 mg daily for the past 2 years. She takes it in the morning. She sometimes experiences heartburn at the end of the day. She and her husband, who has Barrett's esophagus, eat early in the evening and elevate the head of the bed at night. She has occasional dysphagia to solids. An upper GI series in July 2007 to evaluate a lump in her throat showed C5-T1 spurring with a mild extrinsic compression of the cervical esophagus by bony spurs especially at the level C7 to T1. There was no significant reflux noted. A colonoscopy in October 2012 showed internal hemorrhoids.    Past Medical History  Diagnosis Date  . Allergy   . Asthma   . Depression   . GERD (gastroesophageal reflux disease)   . Hyperlipidemia   . Anxiety   . Vitamin D deficiency   . DDD (degenerative disc disease)    Past Surgical History  Procedure Date  . Foot neuroma surgery     multiple times bilaterally  . Plantar fascia release   . 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  She has never used smokeless tobacco. She reports that she does not drink alcohol or use illicit drugs. family history includes Bone cancer in her paternal grandfather; Breast cancer in her sister; Diabetes in her mother; Heart disease in her mother; Melanoma in her sister; and Tuberculosis in her paternal grandfather.  There is no history of Colon cancer. Allergies  Allergen Reactions  . Penicillins Swelling    Swelling  Of joints        Review of Systems:  The remainder of the 10 point ROS is negative except as outlined in H&P   Physical Exam: General  appearance  Well developed, in no distress. Her voice at your source Eyes- non icteric. HEENT nontraumatic, normocephalic. Mouth no lesions, tongue papillated, no cheilosis. Neck supple without adenopathy, thyroid not enlarged, no carotid bruits, no JVD. Lungs Clear to auscultation bilaterally. Cor normal S1, normal S2, regular rhythm, no murmur,  quiet precordium. Abdomen: Soft abdomen with normoactive bowel sounds. No distention. No tenderness. Rectal: Soft Hemoccult negative stool Extremities no pedal edema. Skin no lesions. Neurological alert and oriented x 3. Psychological normal mood and affect.  Assessment and Plan:  Problem #1 One-year history of coughing, hoarseness and bronchitis with the possibility of contribution from gastroesophageal reflux. She has not responded to proton pump inhibitors taken for the past 2 years. We will proceed with an upper endoscopy to rule out Barrett's esophagus. She may need an esophageal dilation for solid food dysphagia suggestive of a fibrous ring. She will continue on strict antireflux measures and increase her PPI to 2 a day. We have discussed the possibility that her reflux may be precipitated by coughing and vice versa.  Problem #2 Colorectal screening. She is up-to-date on her colonoscopy at this time.   10/18/2011 Lina Sar

## 2011-10-18 NOTE — Patient Instructions (Addendum)
Dr Juanda Chance has recommended you have an endoscopy. As per your request, we have not yet scheduled that. Please call back at [phone] to schedule the endoscopy as well as a previsit where you will get instructions for the procedure from a nurse. Increase your Dexilant to twice daily. We have sent a prescription of this to your pharmacy. CC: Dr Felipa Eth, Dr Fannie Knee

## 2011-10-19 ENCOUNTER — Ambulatory Visit (INDEPENDENT_AMBULATORY_CARE_PROVIDER_SITE_OTHER)
Admission: RE | Admit: 2011-10-19 | Discharge: 2011-10-19 | Disposition: A | Discharge status: Home / Self Care | Payer: BC Managed Care – PPO | Source: Ambulatory Visit | Attending: Internal Medicine | Admitting: Internal Medicine

## 2011-10-19 ENCOUNTER — Encounter: Payer: Self-pay | Admitting: Internal Medicine

## 2011-10-19 DIAGNOSIS — J329 Chronic sinusitis, unspecified: Secondary | ICD-10-CM

## 2011-10-26 NOTE — Progress Notes (Signed)
Quick Note:  Spoke with husband-states patient is out of home at this time but will have her call me back asap. ______

## 2011-10-26 NOTE — Progress Notes (Signed)
Quick Note:  Pt called me back-aware of the results. ______

## 2011-10-28 ENCOUNTER — Encounter

## 2011-11-02 ENCOUNTER — Ambulatory Visit (AMBULATORY_SURGERY_CENTER): Payer: BC Managed Care – PPO

## 2011-11-02 VITALS — Ht 64.0 in | Wt 152.2 lb

## 2011-11-02 DIAGNOSIS — K219 Gastro-esophageal reflux disease without esophagitis: Secondary | ICD-10-CM

## 2011-11-03 MED ORDER — GADOPENTETATE DIMEGLUMINE 7.5 MMOL/15 ML (469.01 MG/ML) IV
7.5 mmol/15 mL (469.01 mg/mL) | Freq: Once | INTRAVENOUS | Status: AC
Start: 2011-11-03 — End: 2011-11-03
  Administered 2011-11-03: 14:00:00 via INTRAVENOUS

## 2011-11-03 MED FILL — MAGNEVIST 7.5 MMOL/15 ML (469.01 MG/ML) INTRAVENOUS SOLUTION: 7.5 mmol/15 mL (469.01 mg/mL) | INTRAVENOUS | Qty: 15

## 2011-11-11 ENCOUNTER — Encounter: Payer: BC Managed Care – PPO | Admitting: Internal Medicine

## 2011-11-15 MED ORDER — VALSARTAN-HYDROCHLOROTHIAZIDE 320 MG-12.5 MG TAB
ORAL_TABLET | ORAL | Status: DC
Start: 2011-11-15 — End: 2012-12-11

## 2011-11-15 NOTE — Telephone Encounter (Signed)
[facility] called requesting status of refill request.

## 2011-11-18 MED ORDER — AMLODIPINE 10 MG TAB
10 mg | ORAL_TABLET | ORAL | Status: DC
Start: 2011-11-18 — End: 2012-07-21

## 2011-11-21 ENCOUNTER — Encounter: Payer: Self-pay | Admitting: Internal Medicine

## 2011-11-21 ENCOUNTER — Ambulatory Visit (AMBULATORY_SURGERY_CENTER): Payer: BC Managed Care – PPO | Admitting: Internal Medicine

## 2011-11-21 VITALS — BP 129/68 | HR 80 | Temp 98.8°F | Resp 20 | Ht 64.0 in | Wt 157.0 lb

## 2011-11-21 DIAGNOSIS — K219 Gastro-esophageal reflux disease without esophagitis: Secondary | ICD-10-CM

## 2011-11-21 DIAGNOSIS — K2990 Gastroduodenitis, unspecified, without bleeding: Secondary | ICD-10-CM

## 2011-11-21 DIAGNOSIS — D130 Benign neoplasm of esophagus: Secondary | ICD-10-CM

## 2011-11-21 DIAGNOSIS — K222 Esophageal obstruction: Secondary | ICD-10-CM

## 2011-11-21 DIAGNOSIS — K2970 Gastritis, unspecified, without bleeding: Secondary | ICD-10-CM

## 2011-11-21 MED ORDER — SODIUM CHLORIDE 0.9 % IV SOLN: 500.0000 mL | INTRAVENOUS | Status: DC

## 2011-11-21 MED ORDER — DEXLANSOPRAZOLE 60 MG PO CPDR: DELAYED_RELEASE_CAPSULE | ORAL | Status: DC

## 2011-11-21 NOTE — Op Note (Addendum)
[facility] [address] [place], [place]  [address]  ENDOSCOPY PROCEDURE REPORT  PATIENT:  Alyssa Elliott, Alyssa Elliott  MR#:  [id number] BIRTHDATE:  September 19, 1951, 60 yrs. old  GENDER:  female  ENDOSCOPIST:  Hedwig Morton. Juanda Chance, MD Referred by:  Fannie Knee, M.D., Dr Felipa Eth  PROCEDURE DATE:  11/21/2011 PROCEDURE:  EGD with biopsy, 43239, Elease Hashimoto Dilation of Esophagus ASA CLASS:  Class II INDICATIONS:  GERD, cough hoarsness for about 1 year, UGI series 2007 showed spurring at C5-T1 level, pt  treated for bronchitis by Dr Maple Hudson, currently doing well, question of whether she has LPR, she is on Dexilant  MEDICATIONS:   MAC sedation, administered by CRNA, propofol (Diprivan) 200 mg TOPICAL ANESTHETIC:  none  DESCRIPTION OF PROCEDURE:   After the risks benefits and alternatives of the procedure were thoroughly explained, informed consent was obtained.  The LB GIF-H180 [id number] endoscope was introduced through the mouth and advanced to the second portion of the duodenum, without limitations.  The instrument was slowly withdrawn as the mucosa was fully examined. 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 maloney dilator r/o Barrett's, 41F maloney passed without difficulty  A hiatal hernia was found. 2 cm hiatal hernia,  Otherwise the examination was normal (see image3, image4, and image5).    Retroflexed views revealed no abnormalities.    The scope was then withdrawn from the patient and the procedure completed.  COMPLICATIONS:  None  ENDOSCOPIC IMPRESSION: 1) Stricture in the distal esophagus 2) Hiatal hernia 3) Otherwise normal examination s/p passage of 41F maloney dilator there are obvious signs of chronic GERD, question is whether GERD is causing LPR, would double up on PPI for 6-8 weeks, consider repeating Ba esophagram to assess  motility and reflux RECOMMENDATIONS: 1) Await biopsy results 2) Anti-reflux regimen to be follow Dexilant 60 mg po bid  REPEAT EXAM:  In 0 year(s) for.  ______________________________ Hedwig Morton. Juanda Chance, MD  CC:  n. REVISED:  11/21/2011 04:10 PM eSIGNED:   Hedwig Morton. Bridgid Printz at 11/21/2011 04:10 PM  Sheila Oats, [phone]

## 2011-11-21 NOTE — Progress Notes (Signed)
Patient did not have preoperative order for IV antibiotic SSI prophylaxis. (G8918)  Patient did not experience any of the following events: a burn prior to discharge; a fall within the facility; wrong site/side/patient/procedure/implant event; or a hospital transfer or hospital admission upon discharge from the facility. (G8907)  

## 2011-11-21 NOTE — Patient Instructions (Signed)

## 2011-11-22 ENCOUNTER — Telehealth: Payer: Self-pay | Admitting: *Deleted

## 2011-11-22 NOTE — Telephone Encounter (Signed)
  Follow up Call-  Call back number 11/21/2011 07/19/2011  Post procedure Call Back phone  # [phone]  Permission to leave phone message Yes -     Patient questions:  Message left to call if necessary.

## 2011-11-28 ENCOUNTER — Encounter: Payer: Self-pay | Admitting: Internal Medicine

## 2011-12-29 NOTE — Addendum Note (Signed)
Addended by: Dorathy Kinsman D on: 12/29/2011 03:22 PM     Modules accepted: Orders

## 2011-12-29 NOTE — Patient Instructions (Signed)
DASH Diet: After Your Visit  Your Care Instructions  The DASH diet is an eating plan that can help lower your blood pressure. DASH stands for Dietary Approaches to Stop Hypertension. Hypertension is high blood pressure.  The DASH diet focuses on eating foods that are high in calcium, potassium, and magnesium. These nutrients can lower blood pressure. The foods that are highest in these nutrients are fruits, vegetables, low-fat dairy products, nuts, seeds, and legumes. But taking calcium, potassium, and magnesium supplements instead of eating foods that are high in those nutrients does not have the same effect. The DASH diet also includes whole grains, fish, and poultry.  The DASH diet is one of several lifestyle changes your doctor may recommend to lower your high blood pressure. Your doctor may also want you to decrease the amount of sodium in your diet. Lowering sodium while following the DASH diet can lower blood pressure even further than just the DASH diet alone.  Follow-up care is a key part of your treatment and safety. Be sure to make and go to all appointments, and call your doctor if you are having problems. It???s also a good idea to know your test results and keep a list of the medicines you take.  How can you care for yourself at home?  Following the DASH diet  ?? Eat 4 to 5 servings of fruit each day. A serving is 1 medium-sized piece of fruit, ?? cup chopped or canned fruit, 1/4 cup dried fruit, or 4 ounces (?? cup) of fruit juice. Choose fruit more often than fruit juice.   ?? Eat 4 to 5 servings of vegetables each day. A serving is 1 cup of lettuce or raw leafy vegetables, ?? cup of chopped or cooked vegetables, or 4 ounces (?? cup) of vegetable juice. Choose vegetables more often than vegetable juice.   ?? Get 2 to 3 servings of low-fat and fat-free dairy each day. A serving is 8 ounces of milk, 1 cup of yogurt, or 1 ?? ounces of cheese.   ?? Eat 7 to 8 servings of grains each day. A serving is 1 slice of  bread, 1 ounce of dry cereal, or ?? cup of cooked rice, pasta, or cooked cereal. Try to choose whole-grain products as much as possible.   ?? Limit lean meat, poultry, and fish to 6 ounces each day. Six ounces is about the size of two decks of cards.   ?? Eat 4 to 5 servings of nuts, seeds, and legumes (cooked dried beans, lentils, and split peas) each week. A serving is 1/3 cup of nuts, 2 tablespoons of seeds, or ?? cup cooked dried beans or peas.   ?? Limit sweets and added sugars to 5 servings or less a week. A serving is 1 tablespoon jelly or jam, ?? cup sorbet, or 1 cup of lemonade.   Tips for success  ?? Start small. Do not try to make dramatic changes to your diet all at once. You might feel that you are missing out on your favorite foods and then be more likely to not follow the plan. Make small changes, and stick with them. Once those changes become habit, add a few more changes.   ?? Try some of the following:   ?? Make it a goal to eat a fruit or vegetable at every meal and at snacks. This will make it easy to get the recommended amount of fruits and vegetables each day.   ?? Try yogurt topped with   fruit and nuts for a snack or healthy dessert.   ?? Add lettuce, tomato, cucumber, and onion to sandwiches.   ?? Combine a ready-made pizza crust with low-fat mozzarella cheese and lots of vegetable toppings. Try using tomatoes, squash, spinach, broccoli, carrots, cauliflower, and onions.   ?? Have a variety of cut-up vegetables with a low-fat dip as an appetizer instead of chips and dip.   ?? Sprinkle sunflower seeds or chopped almonds over salads. Or try adding chopped walnuts or almonds to cooked vegetables.   ?? Try some vegetarian meals using beans and peas. Add garbanzo or kidney beans to salads. Make burritos and tacos with mashed pinto beans or black beans.     Where can you learn more?    Go to http://www.healthwise.net/[facility]   Enter H967 in the search box to learn more about "DASH Diet: After Your Visit."    ??  2006-2013 Healthwise, Incorporated. Care instructions adapted under license by  (which disclaims liability or warranty for this information). This care instruction is for use with your licensed healthcare professional. If you have questions about a medical condition or this instruction, always ask your healthcare professional. [employer]wise, Inc[employer] disclaims any warranty or liability for your use of this information.  Content Version: 9.6.101520; Last Revised: December 02, 2010

## 2011-12-29 NOTE — Progress Notes (Signed)
HISTORY OF PRESENT ILLNESS  Lindsey Huynh is a 61 y.o. female who presents back for follow up.[name] states she has been doing better with a low sodium,low cholesterol diabetic diet.Her blood pressure at home has been normal.She does not check her blood sugars regularly but her A1C in January was high at 51.9.She still complaints of running to the bathroom at night.  HPI    Review of Systems   Constitutional: Negative for fever and chills.   HENT: Negative.    Eyes: Positive for blurred vision.   Respiratory: Negative.    Cardiovascular: Negative for chest pain and leg swelling.   Gastrointestinal: Negative.    Genitourinary: Positive for frequency.   Musculoskeletal: Positive for joint pain.   Skin: Negative.    Endo/Heme/Allergies: Negative.    Psychiatric/Behavioral: Negative.        Physical Exam   Nursing note and vitals reviewed.  Constitutional: She appears well-developed and well-nourished.   HENT:   Head: Normocephalic and atraumatic.   Eyes: Conjunctivae are normal. Pupils are equal, round, and reactive to light.   Neck: Normal range of motion. Neck supple.   Cardiovascular: Normal rate, regular rhythm, normal heart sounds and intact distal pulses.  Exam reveals no gallop and no friction rub.    No murmur heard.  Pulmonary/Chest: Effort normal and breath sounds normal.   Abdominal: Soft. Bowel sounds are normal. She exhibits no distension. There is no tenderness.   Musculoskeletal: She exhibits no edema and no tenderness.   Neurological: She is alert.   Skin: Skin is warm.   Psychiatric: She has a normal mood and affect.       ASSESSMENT and PLAN  1. Hypertension    2. Diabetes mellitus    3. DJD (degenerative joint disease)    4. Hyperlipidemia      Encounter Diagnoses   Name Primary?   ??? Hypertension Yes   ??? Diabetes mellitus    ??? DJD (degenerative joint disease)    ??? Hyperlipidemia      No orders of the defined types were placed in this encounter.     No orders of the defined types were placed in  this encounter.     No orders of the defined types were placed in this encounter.     Lindsey Huynh was seen today for follow-up.    Diagnoses and associated orders for this visit:    Hypertension    Diabetes mellitus    Djd (degenerative joint disease)    Hyperlipidemia        Follow-up Disposition: Not on File  reviewed diet, exercise and weight control  very strongly urged to quit smoking to reduce cardiovascular risk  reviewed medications and side effects in detail  specific diabetic recommendations: home glucose monitoring emphasized and home glucose monitoring demonstrated and taught  use of aspirin to prevent MI and TIA's discussed  Assessment/ Plan:     1. Hypertension    2. Diabetes mellitus    3. DJD (degenerative joint disease)    4. Hyperlipidemia        I have discussed the diagnosis with the patient and the intended plan as seen in the above orders.  The patient has received an after-visit summary along with patient information handout.  I have discussed medication side effects and warnings with the patient as well.    Follow-up Disposition: Not on File

## 2011-12-29 NOTE — Progress Notes (Signed)
Patient is in for diabetes f/u. Reviewed record  In preparation for visit and have obtained necessary documentation.

## 2011-12-30 LAB — LIPID PANEL
Cholesterol, total: 232 mg/dL — ABNORMAL HIGH (ref 100–199)
HDL Cholesterol: 53 mg/dL (ref >39)
LDL, calculated: 127 mg/dL — ABNORMAL HIGH (ref 0–99)
Triglyceride: 261 mg/dL — ABNORMAL HIGH (ref 0–149)
VLDL, calculated: 52 mg/dL — ABNORMAL HIGH (ref 5–40)

## 2011-12-30 LAB — METABOLIC PANEL, COMPREHENSIVE
A-G Ratio: 1.8 (ref 1.1–2.5)
ALT (SGPT): 19 IU/L (ref 0–32)
AST (SGOT): 13 IU/L (ref 0–40)
Albumin: 4.4 g/dL (ref 3.6–4.8)
Alk. phosphatase: 120 IU/L (ref 25–165)
BUN/Creatinine ratio: 20 (ref 11–26)
BUN: 14 mg/dL (ref 8–27)
Bilirubin, total: 0.2 mg/dL (ref 0.0–1.2)
CO2: 22 mmol/L (ref 20–32)
Calcium: 10.2 mg/dL (ref 8.6–10.2)
Chloride: 100 mmol/L (ref 97–108)
Creatinine: 0.71 mg/dL (ref 0.57–1.00)
GFR est non-AA: 92 mL/min/{1.73_m2} (ref >59)
GLOBULIN, TOTAL: 2.4 g/dL (ref 1.5–4.5)
Glucose: 251 mg/dL — ABNORMAL HIGH (ref 65–99)
Potassium: 3.8 mmol/L (ref 3.5–5.2)
Protein, total: 6.8 g/dL (ref 6.0–8.5)
Sodium: 136 mmol/L (ref 134–144)
eGFR If African American: 106 mL/min/{1.73_m2} (ref >59)

## 2011-12-30 LAB — HEMOGLOBIN A1C WITH EAG: Hemoglobin A1c: 11.7 % — ABNORMAL HIGH (ref 4.8–5.6)

## 2012-04-04 ENCOUNTER — Other Ambulatory Visit: Payer: Self-pay | Admitting: Obstetrics and Gynecology

## 2012-04-04 DIAGNOSIS — Z1231 Encounter for screening mammogram for malignant neoplasm of breast: Secondary | ICD-10-CM

## 2012-04-06 ENCOUNTER — Other Ambulatory Visit: Payer: Self-pay | Admitting: Obstetrics and Gynecology

## 2012-04-06 DIAGNOSIS — Z780 Asymptomatic menopausal state: Secondary | ICD-10-CM

## 2012-04-25 ENCOUNTER — Ambulatory Visit (HOSPITAL_COMMUNITY)
Admission: RE | Admit: 2012-04-25 | Discharge: 2012-04-25 | Disposition: A | Discharge status: Home / Self Care | Payer: BC Managed Care – PPO | Source: Ambulatory Visit | Attending: Obstetrics and Gynecology | Admitting: Obstetrics and Gynecology

## 2012-04-25 DIAGNOSIS — Z780 Asymptomatic menopausal state: Secondary | ICD-10-CM

## 2012-04-25 DIAGNOSIS — Z1231 Encounter for screening mammogram for malignant neoplasm of breast: Secondary | ICD-10-CM | POA: Insufficient documentation

## 2012-05-03 ENCOUNTER — Telehealth: Payer: Self-pay | Admitting: Internal Medicine

## 2012-05-03 NOTE — Telephone Encounter (Signed)
Patient states for the last few days, her hemorrhoid has bothered her. States it is painful but does not bleed. States she can feel it and it is hard. She began using Anusol HC suppositories last night and this AM but has not helped the pain. She will try Soak in tub of warm water/sitz bath 10 minutes BID, Tucs pads, meticulous cleaning between bowel movements, baby wipes instead of tissue. Offered OV today but patient declined. Scheduled with Mike Gip, PA on 05/04/12 at 2:30 PM.(Dr. Juanda Chance supervising)

## 2012-05-03 NOTE — Telephone Encounter (Signed)
OK, I agree.

## 2012-05-04 ENCOUNTER — Encounter: Payer: Self-pay | Admitting: Physician Assistant

## 2012-05-04 ENCOUNTER — Ambulatory Visit (INDEPENDENT_AMBULATORY_CARE_PROVIDER_SITE_OTHER): Payer: BC Managed Care – PPO | Admitting: Physician Assistant

## 2012-05-04 ENCOUNTER — Encounter (INDEPENDENT_AMBULATORY_CARE_PROVIDER_SITE_OTHER): Payer: Self-pay | Admitting: General Surgery

## 2012-05-04 ENCOUNTER — Ambulatory Visit (INDEPENDENT_AMBULATORY_CARE_PROVIDER_SITE_OTHER): Payer: BC Managed Care – PPO | Admitting: General Surgery

## 2012-05-04 VITALS — BP 100/60 | HR 66 | Ht 63.5 in | Wt 156.0 lb

## 2012-05-04 VITALS — BP 132/78 | HR 76 | Temp 97.8°F | Resp 16 | Ht 63.5 in | Wt 156.8 lb

## 2012-05-04 DIAGNOSIS — K645 Perianal venous thrombosis: Secondary | ICD-10-CM

## 2012-05-04 DIAGNOSIS — K649 Unspecified hemorrhoids: Secondary | ICD-10-CM

## 2012-05-04 MED ORDER — LIDOCAINE-HYDROCORTISONE ACE 3-2.5 % RE KIT: PACK | RECTAL | Status: DC

## 2012-05-04 NOTE — Patient Instructions (Signed)
Go directly to [facility] to see Dr. Janee Morn.  Go asap because they have a spot on their urgent, Doc of the day  schedule for you. We sent a prescription for Anamantle 3-2.5% kit with 1 refill to [facility] [place], [place].  Do Sitz baths- hot tub soaks.

## 2012-05-04 NOTE — Progress Notes (Signed)
Subjective:     Patient ID: Alyssa Elliott, female   DOB: 11-20-1950, 61 y.o.   MRN: [id number]  HPI Patient has a 3 day history of painful external hemorrhoid. The pain has been worsening. She was seen in the [facility] today and referred for urgent clinic. She is not having any bleeding. She has not had any significant constipation recently.  Review of Systems     Objective:   Physical Exam  Cardiovascular: Normal rate and normal heart sounds.   Pulmonary/Chest: Effort normal.  Abdominal: Soft. There is no tenderness.   External anal exam reveals moderate-sized external hemorrhoid with thrombosis on the left side. Area was prepped in a sterile fashion. Local anesthetic was injected. A small incision was made with expulsion of the large clot. Further clots were cleaned out. Hemostasis was obtained with pressure. Bulky gauze dressing was applied. She tolerated this well.    Assessment:     Thrombosed external hemorrhoid    Plan:     Excision as above. Local wound care instructions were given. Return p.r.n.

## 2012-05-04 NOTE — Progress Notes (Signed)
I agree with an add-on surgical appoinntment

## 2012-05-04 NOTE — Progress Notes (Signed)
Subjective:    Patient ID: Alyssa Elliott, female    DOB: 09/12/1951, 61 y.o.   MRN: [id number]  HPI Alyssa Elliott is very nice 61 year old white female known to Dr. Anda Kraft underwent colonoscopy in October of 2012 for screening. She was noted to have internal hemorrhoids but an otherwise negative exam. She is also followed for GERD and has history of a distal esophageal stricture.  Patient comes in today as an urgent add-on with complaints of severe rectal pain over the past 3 days. She says the pain is constant aggravated by walking sitting standing and a sharp stabbing and throbbing at times. She has been able to have bowel movements without difficulty. She says she feels like she has a swollen hemorrhoid. She has not had any bleeding. She has been sitting with ice packs alternating with heat over the past 3 days in using Tylenol for pain. She tried to use an Anusol-HC suppository but was unsuccessful because it was too painful. She says she has had hemorrhoids for quite a while but has never really had any symptoms.    Review of Systems  Constitutional: Negative.   HENT: Negative.   Eyes: Negative.   Respiratory: Negative.   Gastrointestinal: Positive for rectal pain.  Genitourinary: Negative.   Musculoskeletal: Negative.   Skin: Negative.   Neurological: Negative.   Hematological: Negative.   Psychiatric/Behavioral: Negative.    Outpatient Prescriptions Prior to Visit  Medication Sig Dispense Refill  . aspirin 81 MG tablet Take 81 mg by mouth daily.        . budesonide-formoterol (SYMBICORT) 160-4.5 MCG/ACT inhaler Inhale 2 puffs into the lungs 2 (two) times daily.      Marland Kitchen dexlansoprazole (DEXILANT) 60 MG capsule Take one tablet twice daily.  60 capsule  1  . docusate sodium (COLACE) 100 MG capsule Take 100 mg by mouth daily.        Marland Kitchen escitalopram (LEXAPRO) 10 MG tablet       . hydrocortisone (ANUSOL-HC) 25 MG suppository Place 1 suppository (25 mg total) rectally at bedtime and  may repeat dose one time if needed.  12 suppository  1  . Multiple Vitamins-Minerals (MULTIVITAMIN WITH MINERALS) tablet Take 1 tablet by mouth daily.        . ORACEA 40 MG capsule Take 1 tablet by mouth Daily.      Marland Kitchen PARoxetine (PAXIL) 30 MG tablet Take 1 tablet by mouth Daily.      . pravastatin (PRAVACHOL) 40 MG tablet Take 1 tablet by mouth Daily.      Marland Kitchen SINGULAIR 10 MG tablet Take 1 tablet by mouth Daily.      Marland Kitchen tretinoin (RETIN-A) 0.025 % cream Apply 1 application topically Daily.      . Vitamin D, Ergocalciferol, (DRISDOL) 50000 UNITS CAPS Take 1 tablet by mouth Once a week.      Marland Kitchen ESTRING 2 MG vaginal ring Place vaginally Every 3 months.       Allergies  Allergen Reactions  . Penicillins Swelling    Swelling  Of joints   Patient Active Problem List  Diagnosis  . Allergic rhinitis due to dust  . Allergic asthma  . GERD (gastroesophageal reflux disease)      History   Social History  . Marital Status: Married    Spouse Name: N/A    Number of Children: 0  . Years of Education: N/A   Occupational History  . retired Financial controller    Social History Main Topics  .  Smoking status: Passive Smoker  . Smokeless tobacco: Never Used  . Alcohol Use: No  . Drug Use: No  . Sexually Active: Not on file   Other Topics Concern  . Not on file   Social History Narrative  . No narrative on file    Objective:   Physical Exam well-developed white female in no acute distress, blood pressure 100/60 pulse 66 height 5 foot 3 weight 156. HEENT; nontraumatic normocephalic EOMI PERRLA sclera anicteric, Supple no JVD, Cardiovascular ;regular rate and rhythm with S1-S2 no murmur or gallop, Pulmonary; clear bilaterally, Abdomen; soft nontender nondistended bowel sounds active there is no palpable mass or hepatosplenomegaly, Rectal; exam there is a prolapsed internal hemorrhoid which appears to be thrombosed, this is exquisitely tender and hard with a bluish coloration, it is reducible.  Psych; mood and affect normal and appropriate.       Assessment & Plan:  #78 61 year old female with thrombosed internal hemorrhoid which is quite painful  Plan; She will be seen this afternoon by surgery;  Dr. Everlena Cooper evacuate the clot. have advised a hot tub soaks over the weekend, and have sent a prescription for AnaMantle cream to use 3-4 times daily as needed.

## 2012-06-04 MED ORDER — GLYBURIDE-METFORMIN 5 MG-500 MG TAB
5-500 mg | ORAL_TABLET | ORAL | Status: DC
Start: 2012-06-04 — End: 2012-12-31

## 2012-07-24 MED ORDER — AMLODIPINE 10 MG TAB
10 mg | ORAL_TABLET | ORAL | Status: DC
Start: 2012-07-24 — End: 2013-01-23

## 2012-07-24 NOTE — Telephone Encounter (Signed)
Pharmacy sent over 2 more request's.

## 2012-10-18 ENCOUNTER — Encounter: Payer: Self-pay | Admitting: Internal Medicine

## 2012-10-18 ENCOUNTER — Other Ambulatory Visit (INDEPENDENT_AMBULATORY_CARE_PROVIDER_SITE_OTHER): Payer: BC Managed Care – PPO

## 2012-10-18 ENCOUNTER — Ambulatory Visit (INDEPENDENT_AMBULATORY_CARE_PROVIDER_SITE_OTHER): Payer: BC Managed Care – PPO | Admitting: Internal Medicine

## 2012-10-18 ENCOUNTER — Ambulatory Visit (INDEPENDENT_AMBULATORY_CARE_PROVIDER_SITE_OTHER)
Admission: RE | Admit: 2012-10-18 | Discharge: 2012-10-18 | Disposition: A | Discharge status: Home / Self Care | Payer: BC Managed Care – PPO | Source: Ambulatory Visit | Attending: Internal Medicine | Admitting: Internal Medicine

## 2012-10-18 ENCOUNTER — Institutional Professional Consult (permissible substitution): Payer: BC Managed Care – PPO | Admitting: Emergency Medicine

## 2012-10-18 VITALS — BP 114/60 | HR 95 | Ht 63.75 in | Wt 160.4 lb

## 2012-10-18 DIAGNOSIS — J329 Chronic sinusitis, unspecified: Secondary | ICD-10-CM

## 2012-10-18 DIAGNOSIS — J309 Allergic rhinitis, unspecified: Secondary | ICD-10-CM

## 2012-10-18 DIAGNOSIS — J45909 Unspecified asthma, uncomplicated: Secondary | ICD-10-CM

## 2012-10-18 DIAGNOSIS — J449 Chronic obstructive pulmonary disease, unspecified: Secondary | ICD-10-CM

## 2012-10-18 DIAGNOSIS — J302 Other seasonal allergic rhinitis: Secondary | ICD-10-CM

## 2012-10-18 DIAGNOSIS — J320 Chronic maxillary sinusitis: Secondary | ICD-10-CM

## 2012-10-18 LAB — CBC WITH DIFFERENTIAL/PLATELET
Basophils Absolute: 0 10*3/uL (ref 0.0–0.1)
Basophils Relative: 0.7 % (ref 0.0–3.0)
Eosinophils Absolute: 0.7 10*3/uL (ref 0.0–0.7)
Eosinophils Relative: 11.6 % — ABNORMAL HIGH (ref 0.0–5.0)
HCT: 46.9 % — ABNORMAL HIGH (ref 36.0–46.0)
Hemoglobin: 15.8 g/dL — ABNORMAL HIGH (ref 12.0–15.0)
Lymphocytes Relative: 27.5 % (ref 12.0–46.0)
Lymphs Abs: 1.5 10*3/uL (ref 0.7–4.0)
MCHC: 33.6 g/dL (ref 30.0–36.0)
MCV: 94.4 fl (ref 78.0–100.0)
Monocytes Absolute: 0.4 10*3/uL (ref 0.1–1.0)
Monocytes Relative: 6.4 % (ref 3.0–12.0)
Neutro Abs: 3 10*3/uL (ref 1.4–7.7)
Neutrophils Relative %: 53.8 % (ref 43.0–77.0)
Platelets: 235 10*3/uL (ref 150.0–400.0)
RBC: 4.97 Mil/uL (ref 3.87–5.11)
RDW: 12.8 % (ref 11.5–14.6)
WBC: 5.6 10*3/uL (ref 4.5–10.5)

## 2012-10-18 LAB — BASIC METABOLIC PANEL
BUN: 11 mg/dL (ref 6–23)
CO2: 27 mEq/L (ref 19–32)
Calcium: 9.7 mg/dL (ref 8.4–10.5)
Chloride: 108 mEq/L (ref 96–112)
Creatinine, Ser: 0.9 mg/dL (ref 0.4–1.2)
GFR: 71.63 mL/min (ref >60.00)
Glucose, Bld: 96 mg/dL (ref 70–99)
Potassium: 4.4 mEq/L (ref 3.5–5.1)
Sodium: 143 mEq/L (ref 135–145)

## 2012-10-18 LAB — SEDIMENTATION RATE: Sed Rate: 13 mm/hr (ref 0–22)

## 2012-10-18 NOTE — Progress Notes (Signed)
10/14/11- 62 yoF never smoker referred by Dr Larina Earthly because of persistent cough with hoarseness. She says this has been going on for several months intermittently after onset in the spring of 2012. She has a history of chronic bronchitis lingering after colds. She has been going back and forth to [place] to stay at her father's house. She describes that house has moldy and dusty. Every time she goes, she returns with bronchitis and laryngitis. She will clear for a week or so in between but started all over again with repeated exposure. She also had a flulike illness and was bedridden at Thanksgiving. She took a Z-Pak with prednisone then. Laryngitis varies with cough but has been worse recently. A sister also gets significant rhinitis and chest congestion staying at that house, as the sisters rotate responsibility for 64 year old father who won't leave the house. Dr. Sycamore Callas is her allergist, on weekly allergy shots against, and inhalant allergens.. Cough has been productive at times, mainly when she had the flu, and this still occasionally productive of some yellow sputum. She is short of breath she does not use her inhalers including Symbicort over the past year. She takes Paxil and for history of mild reflux. She denies nighttime cough. There is a chronic history of intermittent asthma with some sinusitis and previous pneumonia. Medical history includes cardiac ablation in 1993. No ENT surgery. 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  CXR 09/2010- was negative. Family hx + allergies, asthma, heart disease.  10/18/12-62 yoF never smoker followed for allergic asthma, complicated by chronic maxillary sinusitis  PCP Dr Felipa Eth Continues allergy vaccine with Dr. Leon Callas. ENTdid rhinoscopy and told her "fine" with deviated septum. Describes cough x3 or 4 months with hoarseness croupy". Getting slowly better. Treating herself with oil of oregano vapor.She's worried that  persistent cough might suggest cancer although she never smoked. Visits father is molds and house less often. Started coughing this time before she went. Took antibiotics and prednisone in October of 2013 and resolved initially but never quite clear. Cough fluctuated after that and was productive with thick yellow or white sputum. Always nasal stuffiness with recent left maxillary pressure pain and some postnasal drip. Outside CXR was clear and stable 09/2011 CT maxfac 10/26/11 IMPRESSION:  1. Chronic sinusitis involving the maxillary sinuses, ethmoid air  cells, and left frontal sinus.  Original Report Authenticated By: Dellia Cloud, M.D.  ROS-see HPI Constitutional:   No-   weight loss, night sweats, fevers, chills, fatigue, lassitude. HEENT:   No-  headaches, difficulty swallowing, tooth/dental problems, sore throat,       No-  sneezing, itching, ear ache, +nasal congestion, +post nasal drip,  CV:  No-   chest pain, orthopnea, PND, swelling in lower extremities, anasarca, dizziness, palpitations Resp: Little  shortness of breath with exertion or at rest.              +  productive cough,  + non-productive cough,  No- coughing up of blood.              +  change in color of mucus.  No- wheezing.   Skin: No-   rash or lesions. GI:  No-   heartburn, indigestion, abdominal pain, nausea, vomiting,  GU:  MS:  No-   joint pain or swelling.   Neuro-     nothing unusual Psych:  No- change in mood or affect. No depression or anxiety.  No memory loss.  OBJ General- Alert, Oriented,  Affect-appropriate, Distress- none acute Skin- rash-none, lesions- none, excoriation- none Lymphadenopathy- none Head- atraumatic            Eyes- Gross vision intact, PERRLA, conjunctivae clear secretions            Ears- Hearing, canals-normal            Nose- + sniffing, no-Septal dev, mucus, polyps, erosion, perforation             Throat- Mallampati II , +mucosa mild red cobblestone , drainage- none,  tonsils- atrophic, + hoarse Neck- flexible , trachea midline, no stridor , thyroid nl, carotid no bruit Chest - symmetrical excursion , unlabored           Heart/CV- RRR , no murmur , no gallop  , no rub, nl s1 s2                           - JVD- none , edema- none, stasis changes- none, varices- none           Lung- +coarse breath sounds, wheeze+light, cough- none , dullness-none, rub- none           Chest wall-  Abd-  Br/ Gen/ Rectal- Not done, not indicated Extrem- cyanosis- none, clubbing, none, atrophy- none, strength- nl.. Neuro- grossly intact to observation

## 2012-10-18 NOTE — Patient Instructions (Addendum)
Order- lab  Allergy profile,sed rate, CBC, BMET     Dx allergic rhinitis, asthma with bronchitis  Order- CXR-     Asthma with bronchitis  Order- schedule PFT  Order- schedule-  limited CT maxillo facial   Dx chronic sinusitis              Schedule-  CT chest, with contrast

## 2012-10-19 ENCOUNTER — Ambulatory Visit (INDEPENDENT_AMBULATORY_CARE_PROVIDER_SITE_OTHER)
Admission: RE | Admit: 2012-10-19 | Discharge: 2012-10-19 | Disposition: A | Discharge status: Home / Self Care | Payer: BC Managed Care – PPO | Source: Ambulatory Visit | Attending: Internal Medicine | Admitting: Internal Medicine

## 2012-10-19 DIAGNOSIS — J449 Chronic obstructive pulmonary disease, unspecified: Secondary | ICD-10-CM

## 2012-10-19 DIAGNOSIS — J329 Chronic sinusitis, unspecified: Secondary | ICD-10-CM

## 2012-10-19 LAB — ALLERGY FULL PROFILE
Allergen, D pternoyssinus,d7: <0.1 kU/L
Allergen,Goose feathers, e70: <0.1 kU/L
Alternaria Alternata: <0.1 kU/L
Aspergillus fumigatus, m3: <0.1 kU/L
Bahia Grass: 0.25 kU/L — ABNORMAL HIGH
Bermuda Grass: 0.14 kU/L — ABNORMAL HIGH
Box Elder IgE: <0.1 kU/L
Candida Albicans: <0.1 kU/L
Cat Dander: <0.1 kU/L
Common Ragweed: <0.1 kU/L
Curvularia lunata: <0.1 kU/L
D. farinae: <0.1 kU/L
Dog Dander: <0.1 kU/L
Elm IgE: <0.1 kU/L
Fescue: 0.48 kU/L — ABNORMAL HIGH
G005 Rye, Perennial: 0.5 kU/L — ABNORMAL HIGH
G009 Red Top: 0.5 kU/L — ABNORMAL HIGH
Goldenrod: <0.1 kU/L
Helminthosporium halodes: <0.1 kU/L
House Dust Hollister: <0.1 kU/L
IgE (Immunoglobulin E), Serum: 24.3 IU/mL (ref 0.0–180.0)
Lamb's Quarters: <0.1 kU/L
Oak: <0.1 kU/L
Plantain: <0.1 kU/L
Stemphylium Botryosum: <0.1 kU/L
Sycamore Tree: <0.1 kU/L
Timothy Grass: 0.32 kU/L — ABNORMAL HIGH

## 2012-10-19 MED ORDER — IOHEXOL 300 MG/ML  SOLN
80.0000 mL | Freq: Once | INTRAMUSCULAR | Status: AC | PRN
  Administered 2012-10-19: 80 mL via INTRAVENOUS

## 2012-10-22 ENCOUNTER — Telehealth: Payer: Self-pay | Admitting: Internal Medicine

## 2012-10-22 ENCOUNTER — Ambulatory Visit (INDEPENDENT_AMBULATORY_CARE_PROVIDER_SITE_OTHER): Payer: BC Managed Care – PPO | Admitting: Internal Medicine

## 2012-10-22 DIAGNOSIS — J45909 Unspecified asthma, uncomplicated: Secondary | ICD-10-CM

## 2012-10-22 LAB — PULMONARY FUNCTION TEST

## 2012-10-22 NOTE — Progress Notes (Signed)
Quick Note:  LMTCB ______ 

## 2012-10-22 NOTE — Progress Notes (Signed)
PFT done today. 

## 2012-10-22 NOTE — Telephone Encounter (Signed)
Spoke with patient, made aware of results as listed below per CY.  Patient verbalized understanding and nothing further needed.    Notes Recorded by Waymon Budge, MD on 10/19/2012 at 1:30 PM Increased allergy antibodies particularly for grass pollens Blood count is ok except elevated eosinophils, which are an allergy marker Basic chemistry is ok We will discuss at next ov    Notes Recorded by Waymon Budge, MD on 10/21/2012 at 1:25 PM CT chest- area of probable scarring. The radiologist recommends f/u with repeat CT in 6 months to ensure stability. There is an area of possible inflammation in the right middle lobe that we will discuss on return ov.

## 2012-10-25 ENCOUNTER — Telehealth: Payer: Self-pay | Admitting: Internal Medicine

## 2012-10-25 MED ORDER — CLARITHROMYCIN 500 MG PO TABS: 500.0000 mg | ORAL_TABLET | Freq: Two times a day (BID) | ORAL | Status: DC

## 2012-10-25 NOTE — Progress Notes (Signed)
Quick Note:  Pt aware of results. ______ 

## 2012-10-25 NOTE — Telephone Encounter (Signed)
I spoke with pt. She c/o cough w/ yellow phlem, wheezing, chest tx, nasal congestion, PND, chills x 2-3 days. No f/v/n. She has been using the symbicort and it helps with the chest tx. She is requesting to have something called in. Please advise Dr. Maple Hudson thanks Last OV 10/18/12 Pending 11/09/12 Allergies  Allergen Reactions  . Penicillins Swelling    Swelling  Of joints

## 2012-10-25 NOTE — Telephone Encounter (Signed)
Per CY-okay to give Biaxin 500 mg #14 take 1 po bid after meals no refills. Pt aware and Rx sent to pharmacy.

## 2012-10-27 DIAGNOSIS — J320 Chronic maxillary sinusitis: Secondary | ICD-10-CM | POA: Insufficient documentation

## 2012-10-27 NOTE — Assessment & Plan Note (Signed)
Plan-allergy profile 

## 2012-10-27 NOTE — Assessment & Plan Note (Signed)
She is concerned that Symbicort and increased risk of pneumonia which we discussed I gave her permission to stop it to see what happens. Plan-PFT, CT chest, chest x-ray, sputum cultures, CBC

## 2012-10-27 NOTE — Assessment & Plan Note (Signed)
She reports rhinoscopy by ENT but she may have residual or recurrent sinus infection contributing to her lower respiratory symptoms

## 2012-11-09 ENCOUNTER — Encounter: Payer: Self-pay | Admitting: Internal Medicine

## 2012-11-09 ENCOUNTER — Ambulatory Visit (INDEPENDENT_AMBULATORY_CARE_PROVIDER_SITE_OTHER): Payer: BC Managed Care – PPO | Admitting: Internal Medicine

## 2012-11-09 VITALS — BP 110/62 | HR 70 | Ht 63.75 in | Wt 161.4 lb

## 2012-11-09 DIAGNOSIS — J320 Chronic maxillary sinusitis: Secondary | ICD-10-CM

## 2012-11-09 DIAGNOSIS — J45909 Unspecified asthma, uncomplicated: Secondary | ICD-10-CM

## 2012-11-09 DIAGNOSIS — J302 Other seasonal allergic rhinitis: Secondary | ICD-10-CM

## 2012-11-09 DIAGNOSIS — R911 Solitary pulmonary nodule: Secondary | ICD-10-CM

## 2012-11-09 DIAGNOSIS — J309 Allergic rhinitis, unspecified: Secondary | ICD-10-CM

## 2012-11-09 MED ORDER — CLARITHROMYCIN 500 MG PO TABS: 500.0000 mg | ORAL_TABLET | Freq: Two times a day (BID) | ORAL | Status: DC

## 2012-11-09 NOTE — Progress Notes (Signed)
10/14/11- 1 yoF never smoker referred by Dr Larina Earthly because of persistent cough with hoarseness. She says this has been going on for several months intermittently after onset in the spring of 2012. She has a history of chronic bronchitis lingering after colds. She has been going back and forth to [place] to stay at her father's house. She describes that house has moldy and dusty. Every time she goes, she returns with bronchitis and laryngitis. She will clear for a week or so in between but started all over again with repeated exposure. She also had a flulike illness and was bedridden at Thanksgiving. She took a Z-Pak with prednisone then. Laryngitis varies with cough but has been worse recently. A sister also gets significant rhinitis and chest congestion staying at that house, as the sisters rotate responsibility for 42 year old father who won't leave the house. Dr. Fleetwood Callas is her allergist, on weekly allergy shots against, and inhalant allergens.. Cough has been productive at times, mainly when she had the flu, and this still occasionally productive of some yellow sputum. She is short of breath she does not use her inhalers including Symbicort over the past year. She takes Paxil and for history of mild reflux. She denies nighttime cough. There is a chronic history of intermittent asthma with some sinusitis and previous pneumonia. Medical history includes cardiac ablation in 1993. No ENT surgery. 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  CXR 09/2010- was negative. Family hx + allergies, asthma, heart disease.  10/18/12-61 yoF never smoker followed for allergic asthma, complicated by chronic maxillary sinusitis  PCP Dr Felipa Eth Continues allergy vaccine with Dr. Jerome Callas. ENTdid rhinoscopy and told her "fine" with deviated septum. Describes cough x3 or 4 months with hoarseness croupy". Getting slowly better. Treating herself with oil of oregano vapor.She's worried that  persistent cough might suggest cancer although she never smoked. Visits father is molds and house less often. Started coughing this time before she went. Took antibiotics and prednisone in October of 2013 and resolved initially but never quite clear. Cough fluctuated after that and was productive with thick yellow or white sputum. Always nasal stuffiness with recent left maxillary pressure pain and some postnasal drip. Outside CXR was clear and stable 09/2011 CT maxfac 10/26/11 IMPRESSION:  1. Chronic sinusitis involving the maxillary sinuses, ethmoid air  cells, and left frontal sinus.  Original Report Authenticated By: Dellia Cloud, M.D.  11/09/12- 77 yoF never smoker followed for allergic asthma, complicated by chronic maxillary sinusitis  PCP Dr Felipa Eth, Allergy Dr Bayard Callas We sent Biaxin for bronchitis. Still some cough with white or yellow sputum. No headache. Symbicort does prevent wheeze.  CT chest 10/25/12 IMPRESSION:  1. No findings for interstitial lung disease, peribronchial  process or worrisome pulmonary lesion.  2. Linear band of probable scarring changes in the right lower  lobe. A follow-up noncontrast chest CT in 6 months is suggested to  confer stability.  3. Patchy ground-glass opacity in the right middle lobe likely  inflammatory or post infectious findings or alveolitis.  4. No mediastinal or hilar mass or adenopathy.  Original Report Authenticated By: Rudie Meyer, M.D.  CT maxfac 10/25/12 IMPRESSION:  1. Slight decrease and extent of left maxillary sinus disease.  Moderate mucosal thickening remains.  2. Similar appearance of mucosal thickening at the floor of the  right maxillary sinus.  3. Interval increase in opacification of the left frontal sinus.  Original Report Authenticated By: Marin Roberts, M.D. PFT-10/22/12- within normal limits. FVC  2.76/92%, FEV1 2.16/99%, FEV1/FVC 0.78. Slight response to bronchodilator. TLC 83%, DLCO 94%. LAB 10/18/12- Allergy  Profile Total IgE 24.3, elevated for grass pollens. ESR 13, EOS 11.6.  ROS-see HPI Constitutional:   No-   weight loss, night sweats, fevers, chills, fatigue, lassitude. HEENT:   No-  headaches, difficulty swallowing, tooth/dental problems, sore throat,       No-  sneezing, itching, ear ache, +nasal congestion, +post nasal drip,  CV:  No-   chest pain, orthopnea, PND, swelling in lower extremities, anasarca, dizziness, palpitations Resp: Little  shortness of breath with exertion or at rest.              +  productive cough,  + non-productive cough,  No- coughing up of blood.              No- change in color of mucus.  No- wheezing.   Skin: No-   rash or lesions. GI:  No-   heartburn, indigestion, abdominal pain, nausea, vomiting,  GU:  MS:  No-   joint pain or swelling.   Neuro-     nothing unusual Psych:  No- change in mood or affect. No depression or anxiety.  No memory loss.  OBJ General- Alert, Oriented, Affect-appropriate, Distress- none acute Skin- rash-none, lesions- none, excoriation- none Lymphadenopathy- none Head- atraumatic            Eyes- Gross vision intact, PERRLA, conjunctivae clear secretions            Ears- Hearing, canals-normal            Nose- + sniffing, no-Septal dev, mucus, polyps, erosion, perforation             Throat- Mallampati II , +mucosa mild red cobblestone , drainage- none, tonsils- atrophic,  Neck- flexible , trachea midline, no stridor , thyroid nl, carotid no bruit Chest - symmetrical excursion , unlabored           Heart/CV- RRR , no murmur , no gallop  , no rub, nl s1 s2                           - JVD- none , edema- none, stasis changes- none, varices- none           Lung- clear, wheeze+light, cough- none , dullness-none, rub- none           Chest wall-  Abd-  Br/ Gen/ Rectal- Not done, not indicated Extrem- cyanosis- none, clubbing, none, atrophy- none, strength- nl.. Neuro- grossly intact to observation     Lobe no

## 2012-11-09 NOTE — Patient Instructions (Addendum)
Order- future non-contrast CT chest in 4 months,  Before next ov, for dx lung nodule  Script biaxin sent  If you don't find your nose getting better, see what Dr Annalee Genta thinks  Copy of your Allergy Profile ImmunoCap to show Dr [place] Callas

## 2012-11-12 DIAGNOSIS — R911 Solitary pulmonary nodule: Secondary | ICD-10-CM | POA: Insufficient documentation

## 2012-11-12 NOTE — Assessment & Plan Note (Signed)
Asthma is controlled with Symbicort. Recent acute bronchitis may have been an incidental seasonal viral infection. Watch for frequent exacerbations.

## 2012-11-12 NOTE — Assessment & Plan Note (Signed)
Fungal antibodies don't seem to be elevated but we may want to check specifically with question of whether she has a chronic allergic fungal sinusitis

## 2012-11-12 NOTE — Assessment & Plan Note (Signed)
I discussed return to Dr. Obie Dredge

## 2012-11-12 NOTE — Assessment & Plan Note (Signed)
This is probably inflammatory. She was uncomfortable waiting 6 months as suggested by radiology. We're going to repeat CT chest in 4 months.

## 2012-12-10 NOTE — Telephone Encounter (Signed)
LOV   12/29/2011

## 2012-12-11 LAB — AMB POC URINALYSIS DIP STICK AUTO W/O MICRO
Bilirubin (UA POC): NEGATIVE
Blood (UA POC): NEGATIVE
Ketones (UA POC): NEGATIVE
Leukocyte esterase (UA POC): NEGATIVE
Nitrites (UA POC): NEGATIVE
Protein (UA POC): NEGATIVE mg/dL
Specific gravity (UA POC): 1.02 (ref 1.001–1.035)
Urobilinogen (UA POC): 0.2 (ref 0.2–1)
pH (UA POC): 6 (ref 4.6–8.0)

## 2012-12-11 LAB — AMB POC GLUCOSE BLOOD, BY GLUCOSE MONITORING DEVICE: Glucose POC: 352 mg/dL

## 2012-12-11 LAB — AMB POC HEMOGLOBIN A1C: Hemoglobin A1c (POC): 11.5 %

## 2012-12-11 MED ORDER — INSULIN ASPART 100 UNIT/ML (3 ML) SUB-Q PEN
100 unit/mL (3 mL) | PACK | SUBCUTANEOUS | Status: DC
Start: 2012-12-11 — End: 2014-02-18

## 2012-12-11 MED ORDER — VALSARTAN-HYDROCHLOROTHIAZIDE 160 MG-25 MG TAB
160-25 mg | ORAL_TABLET | Freq: Every day | ORAL | Status: DC
Start: 2012-12-11 — End: 2013-01-23

## 2012-12-11 NOTE — Telephone Encounter (Signed)
SENT TO PROVIDER

## 2012-12-11 NOTE — Patient Instructions (Signed)
Counting Carbohydrate When You Take Insulin: After Your Visit  Your Care Instructions  You don't have to eat special foods when you take insulin. You just have to be careful to eat healthy foods. And you have to spread throughout the day the carbohydrate you eat. Carbohydrate raises blood sugar higher and more quickly than any other nutrient. It is found in desserts, breads and cereals, and fruit. It's also found in starchy vegetables such as potatoes and corn, grains such as rice and pasta, and milk and yogurt.  The more carbohydrate, or carbs, you eat at one time, the higher your blood sugar will rise. Spreading carbs throughout the day helps keep your blood sugar levels within your target range. Counting carbs is one of the best ways to keep your blood sugar under control when you use insulin. It helps you match the right amount of insulin to the number of grams of carbohydrate in a meal. You need to test your blood sugar several times a day to learn how carbs affect you. Then you can change your diet and insulin dose as needed.  A registered dietitian or certified diabetes educator can help you plan meals and snacks.  Follow-up care is a key part of your treatment and safety. Be sure to make and go to all appointments, and call your doctor if you are having problems. It's also a good idea to know your test results and keep a list of the medicines you take.  How can you care for yourself at home?  Counting carbs  If you use an insulin pump or give yourself insulin shots, you need to know how many grams of carbohydrate are in a meal. This lets you know how much rapid-acting insulin to take before you eat. A pump gives you a constant rate of insulin during the day. But it must be programmed at meals to give you extra insulin to cover the rise in blood sugar after meals. With shots, it is common to take both a long-acting insulin once or twice a day and a short-acting insulin with meals.  When you know how much  carbohydrate you will eat, you can program your pump or give yourself a shot with the right amount of insulin. Or, if you always use the same amount of insulin, you need to make sure that you eat the same amount of carbohydrate at meals.  ?? Learn your own insulin-to-carbohydrate ratio. You and your diabetes health professional will figure out the ratio. You can do this by testing your blood sugar after meals. For example, you may need a certain amount of insulin for every 15 grams of carbohydrate.  ?? Add up the carbohydrate grams in a meal. Then you can figure out how many units of insulin to take based on your insulin-to-carbohydrate ratio.  ?? Look at labels on packaged foods. This can tell you how much carbohydrate is in a serving. You can also use guides from the American Diabetes Association.  ?? Be aware of portions, or serving sizes. If a package has two servings and you eat the whole package, you need to double the number of grams of carbohydrate listed for one serving.  ?? Protein, fat, and fiber do not raise blood sugar as much as carbs do. If you eat a lot of these nutrients in a meal, your blood sugar will rise more slowly than it would otherwise.  ?? Exercise lowers blood sugar. You can use less insulin than you would if you   were not doing exercise. Keep in mind that timing matters. If you exercise within 1 hour after a meal, your body may need less insulin for that meal than it would if you exercised 3 hours after the meal. Test your blood sugar to find out how exercise affects your need for insulin.  Food groups  ?? Eat at least three meals a day.  ?? Plan meals to include food from all the food groups.  ?? Grains: 1 slice of bread (1 ounce), ?? cup of cooked cereal, and 1/3 cup of cooked pasta or rice. These have about 15 grams of carbohydrate in a serving. Choose whole grains. These include whole wheat bread or crackers, oatmeal, and brown rice. Have them more often than refined grains.  ?? Fruit: 1 small  fresh fruit, such as an apple or orange; ?? of a banana; ?? cup of chopped, cooked, or canned fruit; ?? cup of fruit juice; 1 cup of melon or raspberries; and 2 tablespoons of dried fruit. These have about 15 grams of carbohydrate in a serving.  ?? Dairy: 1 cup of nonfat or low-fat milk and 2/3 cup of plain yogurt. These have about 15 grams of carbohydrate in a serving.  ?? Protein foods: Beef, chicken, turkey, fish, eggs, tofu, cheese, cottage cheese, and peanut butter. A serving size of meat is 3 ounces. This is about the size of a deck of cards. Examples of meat substitute serving sizes (equal to 1 ounce of meat) are 1/4 cup of cottage cheese, 1 egg, 1 tablespoon of peanut butter, and ?? cup of tofu. These have very little or no carbohydrate per serving.  ?? Vegetables: Starchy vegetables such as ?? cup of cooked dried beans, peas, potatoes, or corn have about 15 grams of carbohydrate. Nonstarchy vegetables have very little carbohydrate. These include 1 cup of raw leafy vegetables (such as spinach), ?? cup of other vegetables (cooked or chopped), and 3/4 cup of vegetable juice.  ?? Talk to your dietitian or diabetes educator about ways to add limited amounts of sweets into your meal plan.  ?? If you drink alcohol:  ?? Limit it to no more than 1 drink a day for women and 2 drinks a day for men. (One drink is 12 fl oz of beer, 5 fl oz of wine, or 1.5 fl oz liquor.)  ?? Make sure to count drink mixers that have sugar in your total carbohydrate count. These include cola, tonic water, margarita mix, and fruit juice.  ?? Check your blood sugar more often. This is because alcohol can lower your blood sugar too much. This may happen even hours later while you sleep. You may want to eat and adjust your insulin dose when you drink alcohol to prevent severe low blood sugar.  ?? Talk to your doctor. Alcohol may not be recommended when you are taking certain diabetes medicines.   Where can you learn more?   Go to  http://www.healthwise.net/[facility]  Enter G703 in the search box to learn more about "Counting Carbohydrate When You Take Insulin: After Your Visit."   ?? 2006-2014 Healthwise, Incorporated. Care instructions adapted under license by  (which disclaims liability or warranty for this information). This care instruction is for use with your licensed healthcare professional. If you have questions about a medical condition or this instruction, always ask your healthcare professional. Healthwise, Incorporated disclaims any warranty or liability for your use of this information.  Content Version: 10.0.270728; Last Revised: June 26, 2012

## 2012-12-11 NOTE — Progress Notes (Signed)
Patient in medication renewal of medications for diabetes and hypertension.    No other problems voiced at this visit.

## 2012-12-11 NOTE — Progress Notes (Signed)
Lindsey Huynh is a 62 y.o. female who presents with Medication Refill and Hypertension    HPI:  Taneisha Fuson is a 62 y.o. female with past medical history as noted below is here to follow up. Patient has not been seen in this clinic for about a year. Last A1c 12/2011 was 11.7%, today's reading is 11.5% She tells me she has been eating right and has been exercising  And taking medications as directed. Patient tells me she has been doing find. She has had ER er last visit. Patient denies chest pain, shortness of breath, ankle swelling.     Patient Active Problem List   Diagnosis Code   ??? HTN (hypertension) 401.9   ??? GERD (gastroesophageal reflux disease) 530.81   ??? Hypercholesterolemia 272.0   ??? Diabetes 250.00     Review of Systems  As per hpi.    Current Outpatient Prescriptions   Medication Sig Dispense Refill   ??? valsartan-hydrochlorothiazide (DIOVAN HCT) 160-25 mg per tablet Take 1 Tab by mouth daily. PATIENT NEEDS TO CALL OFFICE FOR APPT  30 Tab  0   ??? amLODIPine (NORVASC) 10 mg tablet TAKE 1 TABLET BY MOUTH EVERY DAY  30 Tab  3   ??? glyBURIDE-metFORMIN (GLUCOVANCE) 5-500 mg per tablet TAKE 1 TAB BY MOUTH TWO (2) TIMES DAILY (WITH MEALS). NEEDS OFFICE VISIT.  60 Tab  3   ??? rabeprazole (ACIPHEX) 20 mg tablet Take 20 mg by mouth daily.         Allergies   Allergen Reactions   ??? Steroidal Neuromuscular Blockers Other (comments)     GI Upset     Objective:  BP 140/86   Pulse 76   Temp(Src) 98.4 ??F (36.9 ??C) (Oral)   Resp 20   Ht 5\' 1"  (1.549 m)   Wt 173 lb 12.8 oz (78.835 kg)   BMI 32.86 kg/m2   SpO2 97%  Physical Exam:   General appearance - alert, well appearing, in no distress  Mental status - alert, oriented to person, place, and time  EYE-PERRL, EOMI, fundi normal, corneas normal, no foreign bodies  Chest - clear to auscultation  Heart - normal rate, regular rhythm  Abdomen - soft, nontender, nondistended, no  organomegaly  Ext-peripheral pulses normal, no pedal edema  Neuro -no focal findings      Results for orders placed in visit on 12/11/12   AMB POC URINALYSIS DIP STICK AUTO W/O MICRO       Result Value Range    Color (UA POC) Yellow      Clarity (UA POC) Clear      Glucose (UA POC) 2+  Negative    Bilirubin (UA POC) Negative  Negative    Ketones (UA POC) Negative  Negative    Specific gravity (UA POC) 1.020  1.001 - 1.035    Blood (UA POC) Negative  Negative    pH (UA POC) 6.0  4.6 - 8.0    Protein (UA POC) Negative  Negative mg/dL    Urobilinogen (UA POC) 0.2 mg/dL  0.2 - 1    Nitrites (UA POC) Negative  Negative    Leukocyte esterase (UA POC) Negative  Negative   AMB POC HEMOGLOBIN A1C       Result Value Range    Hemoglobin A1c (POC) 11.5     AMB POC GLUCOSE BLOOD, BY GLUCOSE MONITORING DEVICE       Result Value Range    Glucose POC 352  Assessment/Plan:    ICD-9-CM    1. Diabetes type 2, uncontrolled 250.02 AMB POC URINALYSIS DIP STICK AUTO W/O MICRO     AMB POC HEMOGLOBIN A1C     AMB POC GLUCOSE BLOOD, BY GLUCOSE MONITORING DEVICE     MICROALBUMIN:CREATININE RATIO, RANDOM URINE     METABOLIC PANEL, COMPREHENSIVE     REFERRAL TO OPHTHALMOLOGY     insulin aspart (NOVOLOG FLEXPEN) 100 unit/mL flexpen   2. Unspecified essential hypertension 401.9 METABOLIC PANEL, COMPREHENSIVE   3. Hypercholesterolemia 272.0 LIPID PANEL   4. Screening for thyroid disorder V77.0 TSH, 3RD GENERATION   5. GERD (gastroesophageal reflux disease) 530.81    6. Screening for other and unspecified deficiency anemia V78.1 CBC WITH AUTOMATED DIFF     Lose weight  Increase physical activity  Follow low fat diet  Follow low salt diet  Routine labs ordered    Patient Instructions       Counting Carbohydrate When You Take Insulin: After Your Visit  Your Care Instructions  You don't have to eat special foods when you take insulin. You just have to be careful to eat healthy foods. And you have to spread throughout the day the carbohydrate you eat. Carbohydrate raises blood sugar higher and more quickly than any other nutrient. It  is found in desserts, breads and cereals, and fruit. It's also found in starchy vegetables such as potatoes and corn, grains such as rice and pasta, and milk and yogurt.  The more carbohydrate, or carbs, you eat at one time, the higher your blood sugar will rise. Spreading carbs throughout the day helps keep your blood sugar levels within your target range. Counting carbs is one of the best ways to keep your blood sugar under control when you use insulin. It helps you match the right amount of insulin to the number of grams of carbohydrate in a meal. You need to test your blood sugar several times a day to learn how carbs affect you. Then you can change your diet and insulin dose as needed.  A registered dietitian or certified diabetes educator can help you plan meals and snacks.  Follow-up care is a key part of your treatment and safety. Be sure to make and go to all appointments, and call your doctor if you are having problems. It's also a good idea to know your test results and keep a list of the medicines you take.  How can you care for yourself at home?  Counting carbs  If you use an insulin pump or give yourself insulin shots, you need to know how many grams of carbohydrate are in a meal. This lets you know how much rapid-acting insulin to take before you eat. A pump gives you a constant rate of insulin during the day. But it must be programmed at meals to give you extra insulin to cover the rise in blood sugar after meals. With shots, it is common to take both a long-acting insulin once or twice a day and a short-acting insulin with meals.  When you know how much carbohydrate you will eat, you can program your pump or give yourself a shot with the right amount of insulin. Or, if you always use the same amount of insulin, you need to make sure that you eat the same amount of carbohydrate at meals.  ?? Learn your own insulin-to-carbohydrate ratio. You and your diabetes health professional will figure out the  ratio. You can do this by testing your blood sugar  after meals. For example, you may need a certain amount of insulin for every 15 grams of carbohydrate.  ?? Add up the carbohydrate grams in a meal. Then you can figure out how many units of insulin to take based on your insulin-to-carbohydrate ratio.  ?? Look at labels on packaged foods. This can tell you how much carbohydrate is in a serving. You can also use guides from the American Diabetes Association.  ?? Be aware of portions, or serving sizes. If a package has two servings and you eat the whole package, you need to double the number of grams of carbohydrate listed for one serving.  ?? Protein, fat, and fiber do not raise blood sugar as much as carbs do. If you eat a lot of these nutrients in a meal, your blood sugar will rise more slowly than it would otherwise.  ?? Exercise lowers blood sugar. You can use less insulin than you would if you were not doing exercise. Keep in mind that timing matters. If you exercise within 1 hour after a meal, your body may need less insulin for that meal than it would if you exercised 3 hours after the meal. Test your blood sugar to find out how exercise affects your need for insulin.  Food groups  ?? Eat at least three meals a day.  ?? Plan meals to include food from all the food groups.  ?? Grains: 1 slice of bread (1 ounce), ?? cup of cooked cereal, and 1/3 cup of cooked pasta or rice. These have about 15 grams of carbohydrate in a serving. Choose whole grains. These include whole wheat bread or crackers, oatmeal, and brown rice. Have them more often than refined grains.  ?? Fruit: 1 small fresh fruit, such as an apple or orange; ?? of a banana; ?? cup of chopped, cooked, or canned fruit; ?? cup of fruit juice; 1 cup of melon or raspberries; and 2 tablespoons of dried fruit. These have about 15 grams of carbohydrate in a serving.  ?? Dairy: 1 cup of nonfat or low-fat milk and 2/3 cup of plain yogurt. These have about 15 grams of  carbohydrate in a serving.  ?? Protein foods: Beef, chicken, [place], fish, eggs, tofu, cheese, cottage cheese, and peanut butter. A serving size of meat is 3 ounces. This is about the size of a deck of cards. Examples of meat substitute serving sizes (equal to 1 ounce of meat) are 1/4 cup of cottage cheese, 1 egg, 1 tablespoon of peanut butter, and ?? cup of tofu. These have very little or no carbohydrate per serving.  ?? Vegetables: Starchy vegetables such as ?? cup of cooked dried beans, peas, potatoes, or corn have about 15 grams of carbohydrate. Nonstarchy vegetables have very little carbohydrate. These include 1 cup of raw leafy vegetables (such as spinach), ?? cup of other vegetables (cooked or chopped), and 3/4 cup of vegetable juice.  ?? Talk to your dietitian or diabetes educator about ways to add limited amounts of sweets into your meal plan.  ?? If you drink alcohol:  ?? Limit it to no more than 1 drink a day for women and 2 drinks a day for men. (One drink is 12 fl oz of beer, 5 fl oz of wine, or 1.5 fl oz liquor.)  ?? Make sure to count drink mixers that have sugar in your total carbohydrate count. These include cola, tonic water, margarita mix, and fruit juice.  ?? Check your blood sugar more often. This  is because alcohol can lower your blood sugar too much. This may happen even hours later while you sleep. You may want to eat and adjust your insulin dose when you drink alcohol to prevent severe low blood sugar.  ?? Talk to your doctor. Alcohol may not be recommended when you are taking certain diabetes medicines.   Where can you learn more?   Go to [email or web address]  Enter G703 in the search box to learn more about "Counting Carbohydrate When You Take Insulin: After Your Visit."   ?? 2006-2014 Healthwise, Incorporated. Care instructions adapted under license by [employer] (which disclaims liability or warranty for this information). This care instruction is for use with your licensed  healthcare professional. If you have questions about a medical condition or this instruction, always ask your healthcare professional. Healthwise, Inc[employer] disclaims any warranty or liability for your use of this information.  Content Version: 10.0.270728; Last Revised: June 26, 2012                Follow-up Disposition:  Return in about 2 weeks (around 12/25/2012) for check sugar.

## 2012-12-12 LAB — MICROALBUMIN:CREATININE RATIO, RANDOM URINE
Creatinine, urine random: 35.3 mg/dL (ref 15.0–278.0)
Microalb/Creat ratio (ug/mg creat.): 35.4 mg/g creat — ABNORMAL HIGH (ref 0.0–30.0)
Microalbumin, urine: 12.5 ug/mL (ref 0.0–17.0)

## 2012-12-31 MED ORDER — GLYBURIDE-METFORMIN 5 MG-500 MG TAB
5-500 mg | ORAL_TABLET | ORAL | Status: DC
Start: 2012-12-31 — End: 2013-02-23

## 2013-01-03 MED ORDER — DOCUSATE SODIUM 250 MG CAP
250 mg | ORAL_CAPSULE | Freq: Every day | ORAL | Status: AC
Start: 2013-01-03 — End: 2013-04-03

## 2013-01-03 NOTE — Progress Notes (Signed)
Chief Complaint   Patient presents with   ??? Blood sugar problem     Patient is in for f/u on diabetes. Reviewed record  In preparation for visit and have obtained necessary documentation.    Blood sugar 5 pc 130.

## 2013-01-03 NOTE — Progress Notes (Signed)
HISTORY OF PRESENT ILLNESS  Lindsey Huynh is a 62 y.o. female presents for follow up  HPI  She is now compliant with diabetes medication and blood sugars have been at goal. She did not start Insulin prescribed on her last OV. Before she took medication only occasionally because she was too busy. She has not had a recent eye exam. She has never had a screening colonoscopy or mammogram    She is requesting to be restarted on a stool softener for chronic constipation. She admits to eating a lot of bread  Review of Systems   All other systems reviewed and are negative.        Physical Exam   Constitutional: She is oriented to person, place, and time. She appears well-developed and well-nourished. No distress.   Cardiovascular: Normal rate, regular rhythm, normal heart sounds and intact distal pulses.    Pulmonary/Chest: Effort normal and breath sounds normal. No respiratory distress. She has no wheezes. She has no rales. She exhibits no tenderness.   Abdominal: Soft. Bowel sounds are normal.   Musculoskeletal: She exhibits no edema and no tenderness.   Neurological: She is alert and oriented to person, place, and time.   Skin: Skin is warm and dry. She is not diaphoretic.   Psychiatric: She has a normal mood and affect. Her behavior is normal. Judgment and thought content normal.       ASSESSMENT and PLAN    ICD-9-CM    1. HTN (hypertension) 401.9    2. GERD (gastroesophageal reflux disease) 530.81    3. Hypercholesterolemia 272.0    4. T2DM (type 2 diabetes mellitus) 250.00 REFERRAL TO OPHTHALMOLOGY   5. Other screening mammogram V76.12 MAM MAMMO BI SCREENING DIGTL   6. Constipation 564.00 docusate sodium (DOK) 250 mg capsule   7. Colon cancer, ascending 153.6 REFERRAL TO COLON AND RECTAL SURGERY     Follow-up Disposition:  Return in about 1 month (around 02/02/2013).  current treatment plan is effective, no change in therapy  reviewed diet, exercise and weight control  reviewed medications and side effects in  detail  specific diabetic recommendations: low cholesterol diet, weight control and daily exercise discussed, home glucose monitoring emphasized, all medications, side effects and compliance discussed carefully, foot care discussed and Podiatry visits discussed, annual eye examinations at Ophthalmology discussed, glycohemoglobin and other lab monitoring discussed and long term diabetic complications discussed  Patient expressed understanding of management plan and willingness to follow recommendations  use of aspirin to prevent MI and TIA's discussed

## 2013-01-23 MED ORDER — AMLODIPINE 10 MG TAB
10 mg | ORAL_TABLET | ORAL | Status: DC
Start: 2013-01-23 — End: 2013-01-24

## 2013-01-23 MED ORDER — VALSARTAN-HYDROCHLOROTHIAZIDE 160 MG-25 MG TAB
160-25 mg | ORAL_TABLET | Freq: Every day | ORAL | Status: DC
Start: 2013-01-23 — End: 2013-11-02

## 2013-01-26 MED ORDER — AMLODIPINE 10 MG TAB
10 mg | ORAL_TABLET | ORAL | Status: DC
Start: 2013-01-26 — End: 2014-02-18

## 2013-02-25 MED ORDER — GLYBURIDE-METFORMIN 5 MG-500 MG TAB
5-500 mg | ORAL_TABLET | ORAL | Status: DC
Start: 2013-02-25 — End: 2014-02-18

## 2013-03-11 ENCOUNTER — Ambulatory Visit (INDEPENDENT_AMBULATORY_CARE_PROVIDER_SITE_OTHER)
Admission: RE | Admit: 2013-03-11 | Discharge: 2013-03-11 | Disposition: A | Discharge status: Home / Self Care | Payer: BC Managed Care – PPO | Source: Ambulatory Visit | Attending: Internal Medicine | Admitting: Internal Medicine

## 2013-03-11 DIAGNOSIS — R911 Solitary pulmonary nodule: Secondary | ICD-10-CM

## 2013-03-14 NOTE — Progress Notes (Signed)
Quick Note:  LMTCB ______ 

## 2013-03-14 NOTE — Progress Notes (Signed)
Quick Note:  Pt aware of results. ______ 

## 2013-03-15 ENCOUNTER — Ambulatory Visit (INDEPENDENT_AMBULATORY_CARE_PROVIDER_SITE_OTHER): Payer: BC Managed Care – PPO | Admitting: Internal Medicine

## 2013-03-15 ENCOUNTER — Other Ambulatory Visit: Payer: BC Managed Care – PPO

## 2013-03-15 ENCOUNTER — Encounter: Payer: Self-pay | Admitting: Internal Medicine

## 2013-03-15 VITALS — BP 116/76 | HR 94 | Ht 64.0 in | Wt 157.6 lb

## 2013-03-15 DIAGNOSIS — J4540 Moderate persistent asthma, uncomplicated: Secondary | ICD-10-CM

## 2013-03-15 DIAGNOSIS — J189 Pneumonia, unspecified organism: Secondary | ICD-10-CM

## 2013-03-15 DIAGNOSIS — J42 Unspecified chronic bronchitis: Secondary | ICD-10-CM

## 2013-03-15 DIAGNOSIS — J45909 Unspecified asthma, uncomplicated: Secondary | ICD-10-CM

## 2013-03-15 DIAGNOSIS — J302 Other seasonal allergic rhinitis: Secondary | ICD-10-CM

## 2013-03-15 DIAGNOSIS — J309 Allergic rhinitis, unspecified: Secondary | ICD-10-CM

## 2013-03-15 MED ORDER — MOMETASONE FURO-FORMOTEROL FUM 200-5 MCG/ACT IN AERO: INHALATION_SPRAY | RESPIRATORY_TRACT | Status: DC

## 2013-03-15 NOTE — Progress Notes (Signed)
10/14/11- 62 yoF never smoker referred by Dr Larina Earthly because of persistent cough with hoarseness. She says this has been going on for several months intermittently after onset in the spring of 2012. She has a history of chronic bronchitis lingering after colds. She has been going back and forth to [place] to stay at her father's house. She describes that house has moldy and dusty. Every time she goes, she returns with bronchitis and laryngitis. She will clear for a week or so in between but started all over again with repeated exposure. She also had a flulike illness and was bedridden at Thanksgiving. She took a Z-Pak with prednisone then. Laryngitis varies with cough but has been worse recently. A sister also gets significant rhinitis and chest congestion staying at that house, as the sisters rotate responsibility for 46 year old father who won't leave the house. Dr. Delta Callas is her allergist, on weekly allergy shots against, and inhalant allergens.. Cough has been productive at times, mainly when she had the flu, and this still occasionally productive of some yellow sputum. She is short of breath she does not use her inhalers including Symbicort over the past year. She takes Paxil and for history of mild reflux. She denies nighttime cough. There is a chronic history of intermittent asthma with some sinusitis and previous pneumonia. Medical history includes cardiac ablation in 1993. No ENT surgery. 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  CXR 09/2010- was negative. Family hx + allergies, asthma, heart disease.  10/18/12-62 yoF never smoker followed for allergic asthma, complicated by chronic maxillary sinusitis  PCP Dr Felipa Eth Continues allergy vaccine with Dr. [place] Callas. ENTdid rhinoscopy and told her "fine" with deviated septum. Describes cough x3 or 4 months with hoarseness croupy". Getting slowly better. Treating herself with oil of oregano vapor.She's worried that  persistent cough might suggest cancer although she never smoked. Visits father is molds and house less often. Started coughing this time before she went. Took antibiotics and prednisone in October of 2013 and resolved initially but never quite clear. Cough fluctuated after that and was productive with thick yellow or white sputum. Always nasal stuffiness with recent left maxillary pressure pain and some postnasal drip. Outside CXR was clear and stable 09/2011 CT maxfac 10/26/11 IMPRESSION:  1. Chronic sinusitis involving the maxillary sinuses, ethmoid air  cells, and left frontal sinus.  Original Report Authenticated By: Dellia Cloud, M.D.  11/09/12- 62 yoF never smoker followed for allergic asthma, complicated by chronic maxillary sinusitis  PCP Dr Felipa Eth, Allergy Dr [place] Callas We sent Biaxin for bronchitis. Still some cough with white or yellow sputum. No headache. Symbicort does prevent wheeze.  CT chest 10/25/12 IMPRESSION:  1. No findings for interstitial lung disease, peribronchial  process or worrisome pulmonary lesion.  2. Linear band of probable scarring changes in the right lower  lobe. A follow-up noncontrast chest CT in 6 months is suggested to  confer stability.  3. Patchy ground-glass opacity in the right middle lobe likely  inflammatory or post infectious findings or alveolitis.  4. No mediastinal or hilar mass or adenopathy.  Original Report Authenticated By: Rudie Meyer, M.D.  CT maxfac 10/25/12 IMPRESSION:  1. Slight decrease and extent of left maxillary sinus disease.  Moderate mucosal thickening remains.  2. Similar appearance of mucosal thickening at the floor of the  right maxillary sinus.  3. Interval increase in opacification of the left frontal sinus.  Original Report Authenticated By: Marin Roberts, M.D. PFT-10/22/12- within normal limits. FVC  2.76/92%, FEV1 2.16/99%, FEV1/FVC 0.78. Slight response to bronchodilator. TLC 83%, DLCO 94%. LAB 10/18/12- Allergy  Profile Total IgE 24.3, elevated for grass pollens. ESR 13, EOS 11.6.  03/15/13- 62 yoF never smoker followed for allergic asthma, complicated by chronic maxillary sinusitis  PCP Dr Felipa Eth, Allergy Dr [place] Callas 4 month follow up.  Had CT Chest done.  increased SOB over the past wk from the heat, chest tightness, and prod cough with yellow mucus.  No f/c/s. She and her sister both take turns visiting father's house in [place] which they have described as dirty and moldy.she did add a dehumidifier. Cough with pale yellow sputum temporarily relieved by Biaxin.less clearly improved on returned to [place]. Sister gets the same syndrome as she walks into that house. Dislikes powder inhaler. Rescue inhaler at least once daily along with Singulair and Symbicort. CT 03/14/13 Findings: Mediastinal lymph nodes are not enlarged by CT size  criteria. Hilar regions are difficult to definitively evaluate  without IV contrast. No axillary adenopathy. Heart size normal.  No pericardial effusion.  Scarring and mild volume loss in the medial right lower lobe, as  before. Vague ground-glass in the medial right middle lobe is  unchanged and likely due to scarring. Vague ground-glass in the  medial left upper lobe is new and likely infectious or inflammatory  in etiology. No worrisome features. No pleural fluid. Airway is  unremarkable.  Incidental imaging of the upper abdomen shows no acute findings.  No worrisome lytic or sclerotic lesions.  IMPRESSION:  No acute findings. No worrisome pulmonary nodules.  Original Report Authenticated By: Leanna Battles, M.D  ROS-see HPI Constitutional:   No-   weight loss, night sweats, fevers, chills, fatigue, lassitude. HEENT:   No-  headaches, difficulty swallowing, tooth/dental problems, sore throat,       No-  sneezing, itching, ear ache, +nasal congestion, +post nasal drip,  CV:  No-   chest pain, orthopnea, PND, swelling in lower extremities, anasarca, dizziness,  palpitations Resp: Little  shortness of breath with exertion or at rest.              +  productive cough,  + non-productive cough,  No- coughing up of blood.              No- change in color of mucus.  No- wheezing.   Skin: No-   rash or lesions. GI:  No-   heartburn, indigestion, abdominal pain, nausea, vomiting,  GU:  MS:  No-   joint pain or swelling.   Neuro-     nothing unusual Psych:  No- change in mood or affect. No depression or anxiety.  No memory loss.  OBJ General- Alert, Oriented, Affect-appropriate, Distress- none acute Skin- rash-none, lesions- none, excoriation- none Lymphadenopathy- none Head- atraumatic            Eyes- Gross vision intact, PERRLA, conjunctivae clear secretions            Ears- Hearing, canals-normal            Nose- + sniffing/ turbinate edema, no-Septal dev, mucus, polyps, erosion, perforation             Throat- Mallampati II , +mucosa mild red cobblestone , drainage- none, tonsils- atrophic,  Neck- flexible , trachea midline, no stridor , thyroid nl, carotid no bruit Chest - symmetrical excursion , unlabored           Heart/CV- RRR , no murmur , no gallop  , no rub, nl s1  s2                           - JVD- none , edema- none, stasis changes- none, varices- none           Lung- clear, wheeze+light on axillary she and, cough-+raspy , dullness-none, rub- none           Chest wall-  Abd-  Br/ Gen/ Rectal- Not done, not indicated Extrem- cyanosis- none, clubbing, none, atrophy- none, strength- nl.. Neuro- grossly intact to observation     Lobe no

## 2013-03-15 NOTE — Patient Instructions (Addendum)
Order- Sputum culture - routine C&S, AFB, Fungal     Dx chronic bronchitis              Lab- Hypersensitivity Pneumonia panel     Dx pneumonia  Sample Dulera 200   2 puffs then rinse mouth, twice daily

## 2013-03-18 ENCOUNTER — Other Ambulatory Visit: Payer: BC Managed Care – PPO

## 2013-03-18 DIAGNOSIS — J42 Unspecified chronic bronchitis: Secondary | ICD-10-CM

## 2013-03-21 LAB — RESPIRATORY CULTURE OR RESPIRATORY AND SPUTUM CULTURE
Gram Stain: NONE SEEN
Gram Stain: NONE SEEN
Organism ID, Bacteria: NORMAL

## 2013-03-23 LAB — HYPERSENSITIVITY PNUEMONITIS PROFILE

## 2013-03-27 NOTE — Progress Notes (Signed)
Quick Note:  Pt aware of results. ______ 

## 2013-03-31 NOTE — Assessment & Plan Note (Signed)
Plan-sputum cultures, hypersensitivity panel. Try Dulera 200

## 2013-03-31 NOTE — Assessment & Plan Note (Signed)
She continues present meds. We again discussed  mediation of the mold problem in her father's house because exposure there causes symptom flare

## 2013-04-02 ENCOUNTER — Encounter: Payer: Self-pay | Admitting: Obstetrics and Gynecology

## 2013-04-05 ENCOUNTER — Ambulatory Visit (INDEPENDENT_AMBULATORY_CARE_PROVIDER_SITE_OTHER): Payer: BC Managed Care – PPO | Admitting: Obstetrics and Gynecology

## 2013-04-05 ENCOUNTER — Encounter: Payer: Self-pay | Admitting: Obstetrics and Gynecology

## 2013-04-05 VITALS — BP 122/68 | HR 78 | Resp 16 | Ht 63.25 in | Wt 157.0 lb

## 2013-04-05 DIAGNOSIS — Z01419 Encounter for gynecological examination (general) (routine) without abnormal findings: Secondary | ICD-10-CM

## 2013-04-05 DIAGNOSIS — Z0000 Encounter for general adult medical examination without abnormal findings: Secondary | ICD-10-CM

## 2013-04-05 LAB — POCT URINALYSIS DIPSTICK
Bilirubin, UA: NEGATIVE
Blood, UA: NEGATIVE
Glucose, UA: NEGATIVE
Ketones, UA: NEGATIVE
Leukocytes, UA: NEGATIVE
Nitrite, UA: NEGATIVE
Protein, UA: NEGATIVE
Urobilinogen, UA: NEGATIVE
pH, UA: 5

## 2013-04-05 MED ORDER — ESTROGENS, CONJUGATED 0.625 MG/GM VA CREA: TOPICAL_CREAM | VAGINAL | Status: DC

## 2013-04-05 NOTE — Progress Notes (Signed)
61 y.o.   Married    Caucasian   female   G0P0   here for annual exam.    No LMP recorded. Patient is postmenopausal.          Sexually active: no  The current method of family planning is post menopausal status.    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 Heart disease Mother   . Diabetes Mother   . Tuberculosis Paternal Grandfather   . Bone cancer Paternal Grandfather   . Breast cancer Sister     estrogen feed  . Cancer Sister     breast  . Colon cancer Neg Hx   . Melanoma Sister     Patient Active Problem List   Diagnosis Date Noted  . Lung nodule seen on imaging study 11/12/2012  . Maxillary sinusitis, chronic 10/27/2012  . Hemorrhoids 05/04/2012  . Thrombosed external hemorrhoid 05/04/2012  . Seasonal and perennial allergic rhinitis 10/16/2011  . Asthma with bronchitis 10/16/2011  . GERD (gastroesophageal reflux disease) 10/16/2011    Past Medical History  Diagnosis Date  . Allergy   . Asthma   . Depression   . GERD (gastroesophageal reflux disease)   . Hyperlipidemia   . Anxiety   . Vitamin D deficiency   . DDD (degenerative disc disease)   . Hemorrhoids     Past Surgical History  Procedure Laterality Date  . Foot neuroma surgery      multiple times bilaterally  . Plantar fascia release    . 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 aspirin 81 MG tablet Take 81 mg by mouth daily.        Marland Kitchen conjugated estrogens (PREMARIN) vaginal  cream Place vaginally. Twice a week      . dexlansoprazole (DEXILANT) 60 MG capsule Take one tablet twice daily.  60 capsule  1  . docusate sodium (COLACE) 100 MG capsule Take 100 mg by mouth daily.        Marland Kitchen escitalopram (LEXAPRO) 10 MG tablet Take 10 mg by mouth daily.       . mometasone-formoterol (DULERA) 200-5 MCG/ACT AERO 2 puffs and rinse mouth twice daily  1 Inhaler  0  . Multiple Vitamins-Minerals (MULTIVITAMIN WITH MINERALS) tablet Take 1 tablet by mouth daily.        Marland Kitchen NEXIUM 40 MG capsule Take 1 capsule by mouth at bedtime.      . ORACEA 40 MG capsule Take 1 tablet by mouth Daily.      Marland Kitchen PARoxetine (PAXIL) 30 MG tablet Take 1 tablet by mouth Daily.      . pravastatin (PRAVACHOL) 40 MG tablet Take 1 tablet by mouth Daily.      Marland Kitchen SINGULAIR 10 MG tablet Take 1 tablet by mouth Daily.      Marland Kitchen tretinoin (RETIN-A) 0.025 % cream Apply 1 application topically Daily.      . Vitamin D, Ergocalciferol, (DRISDOL)  50000 UNITS CAPS Take 1 tablet by mouth Once a week.       No current facility-administered medications for this visit.    ROS: Pertinent items are noted in HPI.  Social Hx: Married, no children.  Pt and husband not sexually active and both of them are ok with that.   Retired Financial controller  Exam:   Ht and Wt stable from last year BP 122/68  Pulse 78  Resp 16  Ht 5' 3.25" (1.607 m)  Wt 157 lb (71.215 kg)  BMI 27.58 kg/m2   Wt Readings from Last 3 Encounters:  04/05/13 157 lb (71.215 kg)  03/15/13 157 lb 9.6 oz (71.487 kg)  11/09/12 161 lb 6.4 oz (73.211 kg)     Ht Readings from Last 3 Encounters:  04/05/13 5' 3.25" (1.607 m)  03/15/13 5\' 4"  (1.626 m)  11/09/12 5' 3.75" (1.619 m)    General appearance: alert, cooperative and appears stated age Head: Normocephalic, without obvious abnormality, atraumatic Neck: no adenopathy, supple, symmetrical, trachea midline and thyroid not enlarged, symmetric, no tenderness/mass/nodules Lungs: clear to auscultation  bilaterally Breasts: Inspection negative, No nipple retraction or dimpling, No nipple discharge or bleeding, No axillary or supraclavicular adenopathy, Normal to palpation without dominant masses Heart: regular rate and rhythm Abdomen: soft, non-tender; bowel sounds normal; no masses,  no organomegaly Extremities: extremities normal, atraumatic, no cyanosis or edema Skin: Skin color, texture, turgor normal. No rashes or lesions Lymph nodes: Cervical, supraclavicular, and axillary nodes normal. No abnormal inguinal nodes palpated Neurologic: Grossly normal   Pelvic: External genitalia:  no lesions              Urethra:  normal appearing urethra with no masses, tenderness or lesions              Bartholins and Skenes: normal                 Vagina: normal appearing vagina with normal color and discharge, no lesions              Cervix: normal appearance, small, stenotic              Pap taken: yes        Bimanual Exam:  Uterus:  uterus is normal size, shape, consistency and nontender, af, mobile                                      Adnexa: normal adnexa in size, nontender and no masses                                      Rectovaginal: Confirms                                      Anus:  normal sphincter tone, no lesions  A: normal menopausal exam, no HRT     V-v atrophy     P: mammogram pap smear counseled on breast self exam, mammography screening, adequate intake of calcium and vitamin D, diet and exercise return annually or prn   RF prem cream  An After Visit Summary was printed and given to the patient.

## 2013-04-05 NOTE — Patient Instructions (Signed)

## 2013-04-09 LAB — IPS PAP TEST WITH HPV

## 2013-04-11 LAB — FUNGUS CULTURE W SMEAR: Smear Result: NONE SEEN

## 2013-04-29 ENCOUNTER — Ambulatory Visit: Payer: BC Managed Care – PPO | Admitting: Internal Medicine

## 2013-05-02 LAB — AFB CULTURE WITH SMEAR (NOT AT ARMC): Acid Fast Smear: NONE SEEN

## 2013-05-03 NOTE — Progress Notes (Signed)
Quick Note:  LMTCB ______ 

## 2013-05-10 NOTE — Progress Notes (Signed)
Quick Note:  Pt aware of results. ______ 

## 2013-09-11 ENCOUNTER — Encounter: Payer: Self-pay | Admitting: Nurse Practitioner

## 2013-11-04 MED ORDER — VALSARTAN-HYDROCHLOROTHIAZIDE 160 MG-25 MG TAB
160-25 mg | ORAL_TABLET | ORAL | Status: DC
Start: 2013-11-04 — End: 2014-02-18

## 2014-02-18 LAB — AMB POC HEMOGLOBIN A1C: Hemoglobin A1c (POC): 6.5 % — AB (ref 4.8–5.6)

## 2014-02-18 MED ORDER — AMLODIPINE 10 MG TAB
10 mg | ORAL_TABLET | ORAL | Status: DC
Start: 2014-02-18 — End: 2016-06-03

## 2014-02-18 MED ORDER — VALSARTAN-HYDROCHLOROTHIAZIDE 160 MG-25 MG TAB
160-25 mg | ORAL_TABLET | ORAL | Status: DC
Start: 2014-02-18 — End: 2016-06-03

## 2014-02-18 MED ORDER — GLYBURIDE-METFORMIN 5 MG-500 MG TAB
5-500 mg | ORAL_TABLET | ORAL | Status: DC
Start: 2014-02-18 — End: 2014-03-12

## 2014-02-18 NOTE — Progress Notes (Signed)
HISTORY OF PRESENT ILLNESS  Lindsey Huynh is a 63 y.o. female with history of T2DM, hypertension, hyperlipidemia, GERD presents for follow up  HPI  She has not checked blood sugar for about 2-3 weeks. Fasting blood sugars 80-120. No recent eye exam, no visual changes. Plantar right foot "feels funny" since back surgery    Recently retired.     Has never had screening colonoscopy and mammogram past due. Hx hysterectomy secondary to DUB     Review of Systems   Neurological: Positive for sensory change.       Physical Exam   Constitutional: She is oriented to person, place, and time. She appears well-developed and well-nourished. No distress.   Eyes: Conjunctivae are normal. Right eye exhibits no discharge. Left eye exhibits no discharge.   Neck: Normal range of motion. Neck supple.   Cardiovascular: Normal rate, regular rhythm and normal heart sounds.    Pulmonary/Chest: Effort normal and breath sounds normal.   Musculoskeletal: She exhibits no edema or tenderness.   Neurological: She is alert and oriented to person, place, and time. No cranial nerve deficit.   Skin: Skin is warm and dry. No rash noted. She is not diaphoretic. No erythema.   Psychiatric: She has a normal mood and affect. Her behavior is normal. Judgment and thought content normal.       ASSESSMENT and PLAN    ICD-9-CM    1. 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 Essential hypertension 742.5 METABOLIC PANEL, COMPREHENSIVE     amLODIPine (NORVASC) 10 mg tablet     valsartan-hydrochlorothiazide (DIOVAN-HCT) 160-25 mg per tablet   3. Hypercholesterolemia 272.0 LIPID PANEL     METABOLIC PANEL, COMPREHENSIVE   4. Postmenopausal V49.81 DEXA BONE DENSITY STUDY AXIAL   5. Other screening mammogram V76.12 MAM MAMMO BI SCREENING  DIGTL     Follow-up Disposition:  Return in about 2 weeks (around 03/04/2014) for physical exam.  current treatment plan is effective, no change in therapy  reviewed diet, exercise and weight control  cardiovascular risk and specific lipid/LDL goals reviewed  specific diabetic recommendations: low cholesterol diet, weight control and daily exercise discussed, home glucose monitoring emphasized, foot care discussed and Podiatry visits discussed, annual eye examinations at Ophthalmology discussed, glycohemoglobin and other lab monitoring discussed and long term diabetic complications discussed

## 2014-02-18 NOTE — Patient Instructions (Signed)
Advance Directives: After Your Visit  Your Care Instructions  An advance directive is a legal way to state your wishes at the end of your life. It tells your family and your doctor what to do if you can no longer say what you want.  There are two main types of advance directives. You can change them any time that your wishes change.  ?? A living will tells your family and your doctor your wishes about life support and other treatment.  ?? A medical power of attorney lets you name a person to make treatment decisions for you when you can't speak for yourself. This person is called a health care agent.  If you do not have an advance directive, decisions about your medical care may be made by a doctor or a judge who doesn't know you.  It may help to think of an advance directive as a gift to the people who care for you. If you have one, they won't have to make tough decisions by themselves.  Follow-up care is a key part of your treatment and safety. Be sure to make and go to all appointments, and call your doctor if you are having problems. It's also a good idea to know your test results and keep a list of the medicines you take.  How can you care for yourself at home?  ?? Discuss your wishes with your loved ones and your doctor. This way, there are no surprises.  ?? Many states have a unique form. Or you might use a universal form that has been approved by many states. This kind of form can sometimes be completed and stored online. Your electronic copy will then be available wherever you have a connection to the Internet. In most cases, doctors will respect your wishes even if you have a form from a different state.  ?? You don't need a lawyer to do an advance directive. But you may want to get legal advice.  ?? Think about these questions when you prepare an advance directive:  ?? Who do you want to make decisions about your medical care if you are not able to? Many people choose a family member, close friend, or doctor.   ?? Do you know enough about life support methods that might be used? If not, talk to your doctor so you understand.  ?? What are you most afraid of that might happen? You might be afraid of having pain, losing your independence, or being kept alive by machines.  ?? Where would you prefer to die? Choices include your home, a hospital, or a nursing home.  ?? Would you like to have information about hospice care to support you and your family?  ?? Do you want to donate organs when you die?  ?? Do you want certain religious practices performed before you die? If so, put your wishes in the advance directive.  ?? Read your advance directive every year, and make changes as needed.  When should you call for help?  Be sure to contact your doctor if you have any questions.   Where can you learn more?   Go to http://www.healthwise.net/[facility]  Enter R264 in the search box to learn more about "Advance Directives: After Your Visit."   ?? 2006-2015 [employer]. Care instructions adapted under license by [facility] (which disclaims liability or warranty for this information). This care instruction is for use with your licensed healthcare professional. If you have questions about a medical condition or this   instruction, always ask your healthcare professional. [employer]wise, Inc[employer] disclaims any warranty or liability for your use of this information.  Content Version: 10.4.390249; Current as of: November 28, 2012

## 2014-02-18 NOTE — Progress Notes (Signed)
Med follow up     1. Have you been to the ER, urgent care clinic since your last visit?  Hospitalized since your last visit? no    2. Have you seen or consulted any other health care providers outside of the [facility] since your last visit?  Include any pap smears or colon screening. no      Due for colonoscopy needs referral

## 2014-02-19 LAB — SPECIMEN STATUS

## 2014-02-19 LAB — METABOLIC PANEL, COMPREHENSIVE
A-G Ratio: 2.5 (ref 1.1–2.5)
ALT (SGPT): 10 IU/L (ref 0–32)
AST (SGOT): 11 IU/L (ref 0–40)
Albumin: 4.7 g/dL (ref 3.6–4.8)
Alk. phosphatase: 104 IU/L (ref 39–117)
BUN/Creatinine ratio: 18 (ref 11–26)
BUN: 11 mg/dL (ref 8–27)
Bilirubin, total: 0.3 mg/dL (ref 0.0–1.2)
CO2: 25 mmol/L (ref 18–29)
Calcium: 9.8 mg/dL (ref 8.7–10.3)
Chloride: 101 mmol/L (ref 97–108)
Creatinine: 0.6 mg/dL (ref 0.57–1.00)
GLOBULIN, TOTAL: 1.9 g/dL (ref 1.5–4.5)
Glucose: 118 mg/dL — ABNORMAL HIGH (ref 65–99)
Potassium: 3.4 mmol/L — ABNORMAL LOW (ref 3.5–5.2)
Protein, total: 6.6 g/dL (ref 6.0–8.5)
Sodium: 140 mmol/L (ref 134–144)

## 2014-02-19 LAB — SPECIMEN STATUS REPORT

## 2014-02-19 LAB — LIPID PANEL
Cholesterol, total: 198 mg/dL (ref 100–199)
HDL Cholesterol: 65 mg/dL (ref >39)
LDL, calculated: 118 mg/dL — ABNORMAL HIGH (ref 0–99)
Triglyceride: 75 mg/dL (ref 0–149)
VLDL, calculated: 15 mg/dL (ref 5–40)

## 2014-03-04 LAB — AMB POC URINALYSIS DIP STICK AUTO W/O MICRO
Bilirubin (UA POC): NEGATIVE
Glucose (UA POC): NEGATIVE
Ketones (UA POC): NEGATIVE
Nitrites (UA POC): NEGATIVE
Specific gravity (UA POC): 1.02 (ref 1.001–1.035)
Urobilinogen (UA POC): 0.2 (ref 0.2–1)
pH (UA POC): 6.5 (ref 4.6–8.0)

## 2014-03-04 NOTE — Patient Instructions (Signed)
Advance Care Planning: After Your Visit  Your Care Instructions  It can be hard to live with an illness that cannot be cured. But if your health is getting worse, you may want to make decisions about end-of-life care. Planning for the end of your life does not mean that you are giving up. It is a way to make sure that your wishes are met. Clearly stating your wishes can make it easier for your loved ones. Making plans while you are still able may also ease your mind and make your final days less stressful and more meaningful.  Follow-up care is a key part of your treatment and safety. Be sure to make and go to all appointments, and call your doctor if you are having problems. It's also a good idea to know your test results and keep a list of the medicines you take.  What can you do to plan for the end of life?  ?? You can bring these issues up with your doctor. You do not need to wait until your doctor starts the conversation. You might start with "I would not be willing to live with ...." When you complete this sentence it helps your doctor understand your wishes.  ?? Talk openly and honestly with your doctor. This is the best way to understand the decisions you will need to make as your health changes. Know that you can always change your mind.  ?? Ask your doctor about commonly used life-support measures. These include tube feedings, breathing machines, and fluids given through a vein (IV). Understanding these treatments will help you decide whether you want them.  ?? You may choose to have these life-supporting treatments for a limited time. This allows a trial period to see whether they will help you. You may also decide that you want your doctor to take only certain measures to keep you alive. It is important to spell out these conditions so that your doctor and family understand them.  ?? Talk to your doctor about how long you are likely to live. He or she may be able to give you an idea of what usually happens  with your specific illness.  ?? Think about preparing papers that state your wishes. This way there will not be any confusion about what you want. You can change your instructions at any time.  Which papers should you prepare?  Advance directives are legal papers that tell doctors how you want to be cared for at the end of your life. You do not need a lawyer to write these papers. Ask your doctor or your state health department for information on how to write your advance directives. They may have the forms for each of these types of papers. Make sure your doctor has a copy of these on file, and give a copy to a family member or close friend.  ?? Consider a do-not-resuscitate order (DNR). This order asks that no extra treatments be done if your heart stops or you stop breathing. Extra treatments may include electrical shock to restart your heart or a machine to breathe for you. If you decide to have a DNR order, ask your doctor to explain and write it. Place the order in your home where everyone can easily see it.  ?? Consider a living will. A living will explains your wishes in case you are in a coma or cannot communicate. Living wills tell doctors to use or not use treatments that would keep you alive. You must   have one or two witnesses or a notary present when you sign this form.  ?? Consider a durable power of attorney. This allows you to name a person to make decisions about your care if you are not able to. Most people ask a close friend or family member. Talk to this person about the kinds of treatments you want and those that you do not want. Make sure this person understands your wishes. If this person is not the health care agent named in your advance directive, also talk with your health care agent.  These legal papers are simple to change. Tell your doctor what you want to change, and ask him or her to make a note in your medical file. Give your family updated copies of the papers.   Where can you learn  more?   Go to http://www.healthwise.net/[facility]  Enter P184 in the search box to learn more about "Advance Care Planning: After Your Visit."   ?? 2006-2015 Healthwise, Incorporated. Care instructions adapted under license by [place] (which disclaims liability or warranty for this information). This care instruction is for use with your licensed healthcare professional. If you have questions about a medical condition or this instruction, always ask your healthcare professional. Healthwise, Incorporated disclaims any warranty or liability for your use of this information.  Content Version: 10.4.390249; Current as of: November 28, 2012              Learning About Living Wills  What is a living will?  A living will is a legal form you use to write down the kind of care you want at the end of your life. It is used by the health professionals who will treat you if you aren't able to decide for yourself.  If you put your wishes in writing, your loved ones and others will know what kind of care you want. They won't need to guess. This can ease your mind and be helpful to others.  A living will is not the same as an estate or property will. An estate will explains what you want to happen with your money and property after you die.  Is a living will a legal document?  A living will is a legal document. Each state has its own laws about living wills. If you move to another state, make sure that your living will is legal in the state where you now live. Or you might use a universal form that has been approved by many states. This kind of form can sometimes be completed and stored online. Your electronic copy will then be available wherever you have a connection to the Internet. In most cases, doctors will respect your wishes even if you have a form from a different state.  ?? You don't need an attorney to complete a living will. But legal advice can be helpful if your state's laws are unclear, your health history is  complicated, or your family can't agree on what should be in your living will.  ?? You can change your living will at any time. Some people find that their wishes about end-of-life care change as their health changes.  ?? In addition to making a living will, think about completing a medical power of attorney form. This form lets you name the person you want to make end-of-life treatment decisions for you (your "health care agent") if you're not able to. Many hospitals and nursing homes will give you the forms you need to complete a living   will and a medical power of attorney.  ?? Your living will is used only if you can't make or communicate decisions for yourself anymore. If you become able to make decisions again, you can accept or refuse any treatment, no matter what you wrote in your living will.  ?? Your state may offer an online registry. This is a place where you can store your living will online so the doctors and nurses who need to treat you can find it right away.  What should you think about when creating a living will?  Talk about your end-of-life wishes with your family members and your doctor. Let them know what you want. That way the people making decisions for you won't be surprised by your choices.  Think about these questions as you make your living will:  ?? Do you know enough about life support methods that might be used? If not, talk to your doctor so you know what might be done if you can't breathe on your own, your heart stops, or you're unable to swallow.  ?? What things would you still want to be able to do after you receive life-support methods? Would you want to be able to walk? To speak? To eat on your own? To live without the help of machines?  ?? If you have a choice, where do you want to be cared for? In your home? At a hospital or nursing home?  ?? Do you want certain religious practices performed if you become very ill?  ?? If you have a choice at the end of your life, where would you prefer  to die? At home? In a hospital or nursing home? Somewhere else?  ?? Would you prefer to be buried or cremated?  ?? Do you want your organs to be donated after you die?  What should you do with your living will?  ?? Make sure that your family members and your health care agent have copies of your living will.  ?? Give your doctor a copy of your living will to keep in your medical record. If you have more than one doctor, make sure that each one has a copy.  ?? You may want to put a copy of your living will where it can be easily found.   Where can you learn more?   Go to http://www.healthwise.net/[facility]  Enter K356 in the search box to learn more about "Learning About Living Wills."   ?? 2006-2015 Healthwise, Incorporated. Care instructions adapted under license by [place] (which disclaims liability or warranty for this information). This care instruction is for use with your licensed healthcare professional. If you have questions about a medical condition or this instruction, always ask your healthcare professional. Healthwise, Incorporated disclaims any warranty or liability for your use of this information.  Content Version: 10.4.390249; Current as of: November 28, 2012

## 2014-03-04 NOTE — Progress Notes (Signed)
Medicare physical     1. Have you been to the ER, urgent care clinic since your last visit?  Hospitalized since your last visit? no    2. Have you seen or consulted any other health care providers outside of the [facility] since your last visit?  Include any pap smears or colon screening. no      Pt is due for colonosocopy needs referral

## 2014-03-04 NOTE — Progress Notes (Signed)
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     History     Past Medical History   Diagnosis Date   ??? Hypertension    ??? GERD (gastroesophageal reflux disease)    ??? Hypercholesterolemia    ??? Diabetes [facility])       Past Surgical History   Procedure Laterality Date   ??? 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 Hx orthopaedic  06-30-10     back surgery (tumor removed)     Current Outpatient Prescriptions   Medication Sig Dispense Refill   ??? omeprazole (PRILOSEC) 20 mg capsule Take 20 mg by mouth daily.       ??? amLODIPine (NORVASC) 10 mg tablet TAKE 1 TABLET BY MOUTH EVERY DAY  30 Tab  3   ??? valsartan-hydrochlorothiazide (DIOVAN-HCT) 160-25 mg per tablet TAKE 1 TABLET BY MOUTH EVERY DAY  30 Tab  3   ??? glyBURIDE-metFORMIN (GLUCOVANCE) 5-500 mg per tablet TAKE 1 TABLET BY MOUTH TWICE A DAY WITH A MEAL  60 Tab  3     Allergies   Allergen Reactions   ??? Steroidal Neuromuscular Blockers Other (comments)     GI Upset     History reviewed. No pertinent family history.  History   Substance Use Topics   ??? Smoking status: Former Smoker   ??? Smokeless tobacco: Never Used   ??? Alcohol Use: 3.0 oz/week     6 Cans of beer per week      Comment: per week     Diet, Lifestyle: generally follows a low sodium diet, exercises sporadically, nonsmoker    Exercise level: moderately active    Depression Risk Screen     PHQ 2 / 9, over the last two weeks 02/18/2014   Little interest or pleasure in doing things Not at all   Feeling down, depressed or hopeless Not at all   Total Score PHQ 2 0     Alcohol Risk Screen   On any occasion during the past 3 months, have you had more than 3 drinks containing alcohol?  No    Do you average more than 7 drinks per week?  No    Functional Ability and Level of Safety      Hearing Loss   normal-to-mild    Activities of Daily Living   Self-care     Fall Risk Screen     Fall Risk Assessment, last 12 mths 02/18/2014   Able to walk? Yes   Fall in past 12 months? No     Abuse Screen   Patient is not abused    Review of Systems   Behavioral/Psych: positive for stressors caused by adult children's need for emotional and financial support    Physical Examination     No exam data present     BP 165/86 mmHg   Pulse 53   Temp(Src) 97.4 ??F (36.3 ??C) (Oral)   Resp 20   Ht 5\' 1"  (1.549 m)   Wt 164 lb (74.39 kg)   BMI 31.00 kg/m2  General appearance: alert, cooperative, no distress, appears stated age  Head: Normocephalic, without obvious abnormality, atraumatic  Eyes: conjunctivae/corneas clear. PERRL, EOM's intact. Fundi benign  Ears: normal TM's and external ear canals AU  Nose: Nares normal. Septum midline. Mucosa normal. No drainage or sinus tenderness.  Throat: Lips, mucosa, and tongue normal. Teeth and gums normal  Neck: supple, symmetrical, trachea midline, no adenopathy, thyroid: not enlarged, symmetric, no tenderness/mass/nodules, no carotid bruit and no JVD  Back: symmetric, no curvature. ROM normal. No CVA tenderness.  Lungs: clear to auscultation bilaterally  Breasts: normal appearance, no masses or tenderness  Heart: regular rate and rhythm, S1, S2 normal, no murmur, click, rub or gallop  Abdomen: soft, non-tender. Bowel sounds normal. No masses,  no organomegaly  Extremities: extremities normal, atraumatic, no cyanosis or edema  Pulses: 2+ and symmetric  Skin: Skin color, texture, turgor normal. No rashes or lesions  Lymph nodes: Cervical, supraclavicular, and axillary nodes normal.  Neurologic: Grossly normal    EKG Screening: normal EKG, normal sinus rhythm, unchanged from previous tracings.     Patient Care Team:  Cora Daniels, NP as PCP - General (Family Practice)  Tresa Endo, MD (Orthopedic Surgery)     End-of-life planning  Advanced Directive discussed and  documented: YES    Assessment/Plan   Education and counseling provided:  End-of-Life planning (with patient's consent)  Pneumococcal Vaccine  Influenza Vaccine  Screening Mammography  Colorectal cancer screening tests  Bone mass measurement (DEXA)  Screening for glaucoma    ICD-9-CM    1. Routine general medical examination at a health care facility V70.0 REFERRAL TO OPHTHALMOLOGY     AMB POC EKG ROUTINE W/ 12 LEADS, INTER & REP     AMB POC URINALYSIS DIP STICK AUTO W/O MICRO   2. Screening for depression V79.0    3. Screening for alcoholism V79.1    4. Colon cancer screening V76.51 REFERRAL FOR COLONOSCOPY   5. Type 2 diabetes mellitus without complication (HCC) 161.09    6. Need for vaccination with 13-polyvalent pneumococcal conjugate vaccine V03.82 PNEUMOCOCCAL CONJ VACCINE 13 VALENT IM     Follow-up Disposition: Not on File  current treatment plan is effective, no change in therapy  reviewed diet, exercise and weight control  reviewed medications and side effects in detail  use of aspirin to prevent MI and TIA's discussed.

## 2014-03-12 MED ORDER — METFORMIN 500 MG TAB
500 mg | ORAL_TABLET | Freq: Two times a day (BID) | ORAL | Status: DC
Start: 2014-03-12 — End: 2014-07-08

## 2014-03-12 MED ORDER — GLIPIZIDE 5 MG TAB
5 mg | ORAL_TABLET | Freq: Two times a day (BID) | ORAL | Status: DC
Start: 2014-03-12 — End: 2014-06-14

## 2014-03-12 NOTE — Telephone Encounter (Signed)
Made pt aware

## 2014-03-12 NOTE — Telephone Encounter (Signed)
Glucovance not approved insurance recommends glipizide-metformin for the pt

## 2014-03-12 NOTE — Telephone Encounter (Signed)
Please advise her due to formulary change diabetes medication had to be changed to two separate medications

## 2014-04-10 ENCOUNTER — Ambulatory Visit: Payer: BC Managed Care – PPO | Admitting: Nurse Practitioner

## 2014-05-27 ENCOUNTER — Ambulatory Visit (INDEPENDENT_AMBULATORY_CARE_PROVIDER_SITE_OTHER): Payer: BC Managed Care – PPO | Admitting: Nurse Practitioner

## 2014-05-27 ENCOUNTER — Encounter: Payer: Self-pay | Admitting: Nurse Practitioner

## 2014-05-27 VITALS — BP 110/72 | HR 72

## 2014-05-27 DIAGNOSIS — N952 Postmenopausal atrophic vaginitis: Secondary | ICD-10-CM

## 2014-05-27 DIAGNOSIS — M949 Disorder of cartilage, unspecified: Secondary | ICD-10-CM

## 2014-05-27 DIAGNOSIS — Z01419 Encounter for gynecological examination (general) (routine) without abnormal findings: Secondary | ICD-10-CM

## 2014-05-27 DIAGNOSIS — Z803 Family history of malignant neoplasm of breast: Secondary | ICD-10-CM

## 2014-05-27 DIAGNOSIS — F4323 Adjustment disorder with mixed anxiety and depressed mood: Secondary | ICD-10-CM

## 2014-05-27 DIAGNOSIS — M8580 Other specified disorders of bone density and structure, unspecified site: Secondary | ICD-10-CM

## 2014-05-27 DIAGNOSIS — M899 Disorder of bone, unspecified: Secondary | ICD-10-CM

## 2014-05-27 DIAGNOSIS — Z780 Asymptomatic menopausal state: Secondary | ICD-10-CM

## 2014-05-27 MED ORDER — ESTROGENS, CONJUGATED 0.625 MG/GM VA CREA: TOPICAL_CREAM | VAGINAL | Status: DC

## 2014-05-27 NOTE — Progress Notes (Signed)
Patient ID: Alyssa Elliott, female   DOB: April 04, 1951, 63 y.o.   MRN: [id number] 63 y.o. G0P0 Married Caucasian Fe here for annual exam.  She is having several problems with a persistent cough.  She has seen specialist and has tried various med's.  Patient's last menstrual period was 09/04/2002.          Sexually active: No.  The current method of family planning is abstinence and post menopausal status.    Exercising: Yes.    Home exercise routine includes walking on most days. Smoker:  no  Health Maintenance: Pap:  04/05/13, WNL, neg HR HPV MMG:  04/25/12, Bi-Rads 1:  Negative  Colonoscopy:  2012 normal, repeat in 10 years BMD:   04/25/12 spine osteopenia TDaP:  02/23/10  Shingles: fall 2014 Labs: PCP   reports that she has never smoked. She has never used smokeless tobacco. She reports that she does not drink alcohol or use illicit drugs.  Past Medical History  Diagnosis Date  . Allergy   . Asthma   . Depression   . GERD (gastroesophageal reflux disease)   . Hyperlipidemia   . Anxiety   . Vitamin D deficiency   . DDD (degenerative disc disease)   . Hemorrhoids     Past Surgical History  Procedure Laterality Date  . Foot neuroma surgery      multiple times bilaterally  . Plantar fascia release    . 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 aspirin 81 MG tablet Take 81 mg by mouth daily.        Marland Kitchen conjugated estrogens (PREMARIN) vaginal cream Place vaginally 2 (two) times a week. Twice a week  42.5 g  0  . dexlansoprazole (DEXILANT) 60 MG capsule Take one tablet twice daily.  60 capsule  1  . docusate sodium (COLACE) 100 MG capsule Take 100 mg by mouth daily.        Marland Kitchen escitalopram (LEXAPRO) 10 MG tablet Take 10 mg by mouth daily.       . meloxicam (MOBIC) 15 MG tablet Take 1 tablet by mouth daily as needed.      . mometasone-formoterol (DULERA) 200-5 MCG/ACT AERO 2 puffs and rinse mouth twice daily  1  Inhaler  0  . Multiple Vitamins-Minerals (MULTIVITAMIN WITH MINERALS) tablet Take 1 tablet by mouth daily.        . ORACEA 40 MG capsule Take 1 tablet by mouth Daily.      Marland Kitchen PARoxetine (PAXIL) 30 MG tablet Take 1 tablet by mouth Daily.      . pravastatin (PRAVACHOL) 40 MG tablet Take 1 tablet by mouth Daily.      Marland Kitchen SINGULAIR 10 MG tablet Take 1 tablet by mouth Daily.      Marland Kitchen tretinoin (RETIN-A) 0.025 % cream Apply 1 application topically Daily.      . Vitamin D, Ergocalciferol, (DRISDOL) 50000 UNITS CAPS Take 1 tablet by mouth Once a week.       No current facility-administered medications for this visit.    Family History  Problem Relation Age of Onset  . Heart disease Mother   . Diabetes Mother   . Tuberculosis Paternal Grandfather   . Bone cancer Paternal Grandfather   . Breast cancer Sister 29  . Colon cancer Neg Hx   . Melanoma Sister   . Breast cancer Sister 2    treated with chemo and radiation  .  Cirrhosis Maternal Grandfather   . Heart failure Paternal Grandmother     ROS:  Pertinent items are noted in HPI.  Otherwise, a comprehensive ROS was negative.  Exam:   BP 110/72  Pulse 72  LMP 09/04/2002    Ht Readings from Last 3 Encounters:  04/05/13 5' 3.25" (1.607 m)  03/15/13 5\' 4"  (1.626 m)  11/09/12 5' 3.75" (1.619 m)    General appearance: alert, cooperative and appears stated age Head: Normocephalic, without obvious abnormality, atraumatic Neck: no adenopathy, supple, symmetrical, trachea midline and thyroid normal to inspection and palpation Lungs: clear to auscultation on the left with scattered rhonchi on the right that clears with cough. Breasts: normal appearance, no masses or tenderness, dense Heart: regular rate and rhythm Abdomen: soft, non-tender; no masses,  no organomegaly Extremities: extremities normal, atraumatic, no cyanosis or edema Skin: Skin color, texture, turgor normal. No rashes or lesions Lymph nodes: Cervical, supraclavicular, and  axillary nodes normal. No abnormal inguinal nodes palpated Neurologic: Grossly normal   Pelvic: External genitalia:  no lesions              Urethra:  normal appearing urethra with no masses, tenderness or lesions              Bartholin's and Skene's: normal                 Vagina: atrophic appearing vagina with normal color and discharge, no lesions              Cervix: anteverted              Pap taken: No. Bimanual Exam:  Uterus:  normal size, contour, position, consistency, mobility, non-tender              Adnexa: no mass, fullness, tenderness               Rectovaginal: Confirms               Anus:  normal sphincter tone, no lesions  A:  Well Woman with normal exam  Postmenopausal no HRT  Atrophic vaginitis - on estrogen cream  Situational anxiety and depression  hypercholesterolemia  Vit d deficiency with Osteopenia  Chronic cough - history of second hand smoke exposure  FMH: breast cancer - sister X 2 (unknown genetic testing)  P:   Reviewed health and wellness pertinent to exam  Pap smear  Not taken today  Mammogram is past due and will schedule  Refilled Premarin vaginal cream only once until Mammo is doe.  Explained rationale.  Counseled with risk of DVT, CVA, cancer, etc.  Will use OTC lubrication  Note faxed for BMD  Information given about ortho bra  Counseled on breast self exam, mammography screening, use and side effects of HRT, osteoporosis, adequate intake of calcium and vitamin D, diet and exercise, Kegel's exercises return annually or prn  An After Visit Summary was printed and given to the patient.

## 2014-05-27 NOTE — Patient Instructions (Addendum)

## 2014-06-01 NOTE — Progress Notes (Signed)
Encounter reviewed by Dr. Brook Silva.  

## 2014-06-03 ENCOUNTER — Other Ambulatory Visit: Payer: Self-pay | Admitting: Nurse Practitioner

## 2014-06-03 DIAGNOSIS — Z1231 Encounter for screening mammogram for malignant neoplasm of breast: Secondary | ICD-10-CM

## 2014-06-05 ENCOUNTER — Telehealth: Payer: Self-pay | Admitting: Nurse Practitioner

## 2014-06-05 NOTE — Telephone Encounter (Signed)
Patient wants to schedule her MMG appointment.  Patient is asking for 3D mmg and was told she needs a " predetermination letter" before it would be covered.

## 2014-06-06 NOTE — Telephone Encounter (Signed)
Message left to return call to [place] at [phone].   Need to know if has has recent mammogram since 04/26/12? Does letter need to go to insurance company?  Clarify patient request.

## 2014-06-14 MED ORDER — CYCLOBENZAPRINE 10 MG TAB
10 mg | ORAL_TABLET | Freq: Three times a day (TID) | ORAL | Status: DC | PRN
Start: 2014-06-14 — End: 2014-07-08

## 2014-06-14 MED ORDER — HYDROCODONE-ACETAMINOPHEN 5 MG-325 MG TAB
5-325 mg | ORAL_TABLET | ORAL | Status: DC | PRN
Start: 2014-06-14 — End: 2014-07-08

## 2014-06-14 MED ORDER — HYDROCODONE-ACETAMINOPHEN 5 MG-325 MG TAB
5-325 mg | ORAL | Status: DC
Start: 2014-06-14 — End: 2014-06-14

## 2014-06-14 MED ORDER — IBUPROFEN 600 MG TAB
600 mg | ORAL_TABLET | Freq: Four times a day (QID) | ORAL | Status: DC | PRN
Start: 2014-06-14 — End: 2017-02-23

## 2014-06-14 MED ORDER — IBUPROFEN 600 MG TAB
600 mg | ORAL | Status: AC
Start: 2014-06-14 — End: 2014-06-14
  Administered 2014-06-14: 15:00:00 via ORAL

## 2014-06-14 MED FILL — HYDROCODONE-ACETAMINOPHEN 5 MG-325 MG TAB: 5-325 mg | ORAL | Qty: 1

## 2014-06-14 MED FILL — IBUPROFEN 600 MG TAB: 600 mg | ORAL | Qty: 1

## 2014-06-14 NOTE — ED Notes (Signed)
I have reviewed discharge instructions with the patient.  The patient verbalized understanding.  Patient was assisted out of the ER in a wheelchair and was transported from facility by family/friends in [place].

## 2014-06-14 NOTE — Progress Notes (Signed)
[facility] [facility]- added Aspirin and removed Glipizide per pt.
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
## 2014-06-18 ENCOUNTER — Ambulatory Visit (HOSPITAL_COMMUNITY)
Admission: RE | Admit: 2014-06-18 | Discharge: 2014-06-18 | Disposition: A | Discharge status: Home / Self Care | Payer: BC Managed Care – PPO | Source: Ambulatory Visit | Attending: Nurse Practitioner | Admitting: Nurse Practitioner

## 2014-06-18 DIAGNOSIS — M8580 Other specified disorders of bone density and structure, unspecified site: Secondary | ICD-10-CM

## 2014-06-18 DIAGNOSIS — Z13820 Encounter for screening for osteoporosis: Secondary | ICD-10-CM | POA: Insufficient documentation

## 2014-06-18 DIAGNOSIS — Z780 Asymptomatic menopausal state: Secondary | ICD-10-CM | POA: Diagnosis not present

## 2014-06-18 DIAGNOSIS — Z1231 Encounter for screening mammogram for malignant neoplasm of breast: Secondary | ICD-10-CM | POA: Insufficient documentation

## 2014-06-26 ENCOUNTER — Telehealth: Payer: Self-pay | Admitting: *Deleted

## 2014-06-26 NOTE — Telephone Encounter (Signed)
Message copied by Graylon Good on Thu Jun 26, 2014  1:51 PM ------      Message from: Kem Boroughs R      Created: Wed Jun 25, 2014  9:45 AM       Let patient know that BMD shows the lowest T Score at the spine at -1.7.  That is a loss from 2013 of -1.9%.  The T Score of left hip neck is -1.0 which is also a loss from 2013. The right hip neck is at -0.8.  The spine measurement falls into the Osteopenic range.  The FRAX score for major osteoporotic fracture in 10 years is 15.5%  (goal is <20%),  The hip fracture risk for 10 years is 0.7% (goal is < 3%).  While some of this is normal and expectant postmenopausal we do not want further loss.  She must maintain calcium, Vit D and exercise programs.  She must do upper body weights at least 3 x a week.  repeat in 2 years. ------

## 2014-06-26 NOTE — Telephone Encounter (Signed)
I have attempted to contact this patient by phone with the following results: left message to return call to [place] at [phone] answering machine (home per [facility]).  No personal information given.  [phone] (Home)

## 2014-06-26 NOTE — Telephone Encounter (Signed)
Pt notified in result note.  

## 2014-06-27 NOTE — Telephone Encounter (Signed)
Patient returned call, she no longer needs a letter.  Will close encounter.

## 2014-06-27 NOTE — Telephone Encounter (Signed)
Message left to return call to [place] at [phone].   Patient had digital screening mammogram on 06/18/14. Calling to assess if still needs assistance?

## 2014-07-03 ENCOUNTER — Other Ambulatory Visit: Payer: Self-pay | Admitting: Allergy

## 2014-07-03 ENCOUNTER — Ambulatory Visit
Admission: RE | Admit: 2014-07-03 | Discharge: 2014-07-03 | Disposition: A | Discharge status: Home / Self Care | Payer: Medicare Other | Source: Ambulatory Visit | Attending: Allergy | Admitting: Allergy

## 2014-07-03 DIAGNOSIS — J209 Acute bronchitis, unspecified: Secondary | ICD-10-CM

## 2014-07-08 ENCOUNTER — Ambulatory Visit: Admit: 2014-07-08 | Discharge: 2014-07-08 | Payer: MEDICARE | Attending: Family | Primary: Family

## 2014-07-08 DIAGNOSIS — E119 Type 2 diabetes mellitus without complications: Secondary | ICD-10-CM

## 2014-07-08 MED ORDER — METFORMIN SR 500 MG 24 HR TABLET
500 mg | ORAL_TABLET | Freq: Every day | ORAL | Status: DC
Start: 2014-07-08 — End: 2016-06-03

## 2014-07-08 MED ORDER — HYDROCODONE-ACETAMINOPHEN 5 MG-325 MG TAB
5-325 mg | ORAL_TABLET | ORAL | Status: DC | PRN
Start: 2014-07-08 — End: 2016-06-03

## 2014-07-08 MED ORDER — CYCLOBENZAPRINE 10 MG TAB
10 mg | ORAL_TABLET | Freq: Three times a day (TID) | ORAL | Status: DC | PRN
Start: 2014-07-08 — End: 2016-06-03

## 2014-07-08 NOTE — Progress Notes (Signed)
Chief Complaint   Patient presents with   ??? Follow-up     back pain, discuss DM medication     Room 7     Pt states she is on Metformin and does not like the side effects. States she did better on Glucophage. Pt states she is on another "little tiny pill for diabetes but does not remember the name"    1. Have you been to the ER, urgent care clinic since your last visit?  Hospitalized since your last visit? Yes 9/26 [facility] r/t back pain    2. Have you seen or consulted any other health care providers outside of the [place] since your last visit?  NO
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
## 2014-09-19 HISTORY — PX: COLONOSCOPY: SHX174

## 2014-11-19 NOTE — Addendum Note (Signed)
Addended by: Cora Daniels on: 11/19/2014 08:21 AM      Modules accepted: Level of Service

## 2014-12-28 ENCOUNTER — Encounter (HOSPITAL_BASED_OUTPATIENT_CLINIC_OR_DEPARTMENT_OTHER): Payer: Self-pay | Admitting: *Deleted

## 2014-12-28 ENCOUNTER — Emergency Department (HOSPITAL_BASED_OUTPATIENT_CLINIC_OR_DEPARTMENT_OTHER)
Admission: EM | Admit: 2014-12-28 | Discharge: 2014-12-28 | Disposition: A | Discharge status: Home / Self Care | Payer: BLUE CROSS/BLUE SHIELD | Attending: Emergency Medicine | Admitting: Emergency Medicine

## 2014-12-28 DIAGNOSIS — Z7982 Long term (current) use of aspirin: Secondary | ICD-10-CM | POA: Insufficient documentation

## 2014-12-28 DIAGNOSIS — Z880 Allergy status to penicillin: Secondary | ICD-10-CM | POA: Insufficient documentation

## 2014-12-28 DIAGNOSIS — J45909 Unspecified asthma, uncomplicated: Secondary | ICD-10-CM | POA: Insufficient documentation

## 2014-12-28 DIAGNOSIS — Z91040 Latex allergy status: Secondary | ICD-10-CM | POA: Insufficient documentation

## 2014-12-28 DIAGNOSIS — F329 Major depressive disorder, single episode, unspecified: Secondary | ICD-10-CM | POA: Insufficient documentation

## 2014-12-28 DIAGNOSIS — N390 Urinary tract infection, site not specified: Secondary | ICD-10-CM | POA: Insufficient documentation

## 2014-12-28 DIAGNOSIS — Z791 Long term (current) use of non-steroidal anti-inflammatories (NSAID): Secondary | ICD-10-CM | POA: Insufficient documentation

## 2014-12-28 DIAGNOSIS — A499 Bacterial infection, unspecified: Secondary | ICD-10-CM

## 2014-12-28 DIAGNOSIS — E785 Hyperlipidemia, unspecified: Secondary | ICD-10-CM | POA: Insufficient documentation

## 2014-12-28 DIAGNOSIS — K219 Gastro-esophageal reflux disease without esophagitis: Secondary | ICD-10-CM | POA: Insufficient documentation

## 2014-12-28 DIAGNOSIS — F419 Anxiety disorder, unspecified: Secondary | ICD-10-CM | POA: Insufficient documentation

## 2014-12-28 DIAGNOSIS — Z79899 Other long term (current) drug therapy: Secondary | ICD-10-CM | POA: Insufficient documentation

## 2014-12-28 LAB — URINALYSIS, ROUTINE W REFLEX MICROSCOPIC
Bilirubin Urine: NEGATIVE
Glucose, UA: NEGATIVE mg/dL
Ketones, ur: NEGATIVE mg/dL
Nitrite: NEGATIVE
Protein, ur: 100 mg/dL — AB
Specific Gravity, Urine: 1.021 (ref 1.005–1.030)
Urobilinogen, UA: 1 mg/dL (ref 0.0–1.0)
pH: 6 (ref 5.0–8.0)

## 2014-12-28 LAB — URINE MICROSCOPIC-ADD ON

## 2014-12-28 MED ORDER — CIPROFLOXACIN HCL 500 MG PO TABS: 500.0000 mg | ORAL_TABLET | Freq: Two times a day (BID) | ORAL | Status: DC

## 2014-12-28 MED ORDER — PHENAZOPYRIDINE HCL 200 MG PO TABS: 200.0000 mg | ORAL_TABLET | Freq: Three times a day (TID) | ORAL | Status: DC | PRN

## 2014-12-28 NOTE — ED Notes (Signed)
Presents to ED w/ c/o burning urination, urine spec sent to lab

## 2014-12-28 NOTE — ED Notes (Signed)
MD at bedside. 

## 2014-12-28 NOTE — ED Provider Notes (Signed)
CSN: [id number]     Arrival date & time 12/28/14  1856 History   First MD Initiated Contact with Patient 12/28/14 1015     Chief Complaint  Patient presents with  . Hematuria     (Consider location/radiation/quality/duration/timing/severity/associated sxs/prior Treatment) HPI Comments: Patient is a 64 year old female who started yesterday evening with burning with urination and noticing blood in her urine. She denies any fevers or chills. She denies any vomiting.  Patient is a 64 y.o. female presenting with hematuria. The history is provided by the patient.  Hematuria This is a new problem. The current episode started yesterday. The problem occurs constantly. The problem has been rapidly worsening. Pertinent negatives include no chest pain and no abdominal pain. Exacerbated by: urinating. Nothing relieves the symptoms. She has tried nothing for the symptoms. The treatment provided no relief.    Past Medical History  Diagnosis Date  . Allergy   . Asthma   . Depression   . GERD (gastroesophageal reflux disease)   . Hyperlipidemia   . Anxiety   . Vitamin D deficiency   . DDD (degenerative disc disease)   . Hemorrhoids    Past Surgical History  Procedure Laterality Date  . Foot neuroma surgery      multiple times bilaterally  . Plantar fascia release    . 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 Heart disease Mother   . Diabetes Mother   . Tuberculosis Paternal Grandfather   . Bone cancer Paternal Grandfather   . Breast cancer Sister 47  . Colon cancer Neg Hx   . Melanoma Sister   . Breast cancer Sister 24    treated with chemo and radiation  . Cirrhosis Maternal Grandfather   . Heart failure Paternal Grandmother    History  Substance Use Topics  . Smoking status: Never Smoker   . Smokeless tobacco: Never Used  . Alcohol Use: No   OB History    Gravida Para Term Preterm AB TAB SAB Ectopic Multiple Living   0               Review of Systems  Cardiovascular: Negative for chest pain.  Gastrointestinal: Negative for abdominal pain.  Genitourinary: Positive for hematuria.  All other systems reviewed and are negative.     Allergies  Codeine; Penicillins; Latex; and Sulfa antibiotics  Home Medications   Prior to Admission medications   Medication Sig Start Date End Date Taking? Authorizing Provider  aspirin 81 MG tablet Take 81 mg by mouth daily.      Historical Provider, MD  conjugated estrogens (PREMARIN) vaginal cream Place vaginally 2 (two) times a week. Twice a week 05/27/14   Antonietta Barcelona, FNP  dexlansoprazole (DEXILANT) 60 MG capsule Take one tablet twice daily. 11/21/11   Lafayette Dragon, MD  docusate sodium (COLACE) 100 MG capsule Take 100 mg by mouth daily.      Historical Provider, MD  escitalopram (LEXAPRO) 10 MG tablet Take 10 mg by mouth daily.  10/28/11   Historical Provider, MD  meloxicam (MOBIC) 15 MG tablet Take 1 tablet by mouth daily as needed. 04/14/14   Historical Provider, MD  mometasone-formoterol Ruthe Mannan) 200-5 MCG/ACT AERO 2 puffs and rinse mouth twice daily 03/15/13   Deneise Lever, MD  Multiple Vitamins-Minerals (MULTIVITAMIN WITH MINERALS) tablet Take 1 tablet by mouth daily.      Historical Provider, MD  ORACEA 40 MG  capsule Take 1 tablet by mouth Daily. 06/07/11   Historical Provider, MD  PARoxetine (PAXIL) 30 MG tablet Take 1 tablet by mouth Daily. 05/11/11   Historical Provider, MD  pravastatin (PRAVACHOL) 40 MG tablet Take 1 tablet by mouth Daily. 04/25/11   Historical Provider, MD  SINGULAIR 10 MG tablet Take 1 tablet by mouth Daily. 04/08/11   Historical Provider, MD  tretinoin (RETIN-A) 0.025 % cream Apply 1 application topically Daily. 04/13/11   Historical Provider, MD  Vitamin D, Ergocalciferol, (DRISDOL) 50000 UNITS CAPS Take 1 tablet by mouth Once a week. 04/25/11   Historical Provider, MD   BP 128/82 mmHg  Pulse 80  Temp(Src) 98.7 F (37.1 C) (Oral)  Resp 18  SpO2  99%  LMP 09/04/2002 Physical Exam  Constitutional: She is oriented to person, place, and time. She appears well-developed and well-nourished. No distress.  HENT:  Head: Normocephalic and atraumatic.  Neck: Normal range of motion. Neck supple.  Cardiovascular: Normal rate and regular rhythm.  Exam reveals no gallop and no friction rub.   No murmur heard. Pulmonary/Chest: Effort normal and breath sounds normal. No respiratory distress. She has no wheezes.  Abdominal: Soft. Bowel sounds are normal. She exhibits no distension. There is tenderness.  There is mild suprapubic tenderness to palpation. There is no CVA tenderness.  Musculoskeletal: Normal range of motion.  Neurological: She is alert and oriented to person, place, and time.  Skin: Skin is warm and dry. She is not diaphoretic.  Nursing note and vitals reviewed.   ED Course  Procedures (including critical care time) Labs Review Labs Reviewed  URINALYSIS, ROUTINE W REFLEX MICROSCOPIC    Imaging Review No results found.   EKG Interpretation None      MDM   Final diagnoses:  None    Urinalysis suggestive of a urinary tract infection. She will be treated with Cipro and Pyridium. To follow-up as needed if not improving or if she worsens.    Veryl Speak, MD 12/28/14 1100

## 2014-12-28 NOTE — Discharge Instructions (Signed)
Cipro as prescribed.  Pyridium as prescribed as needed for burning.  Return to the ER if you develop high fever, vomiting, severe pain, or other new and concerning symptoms.   Urinary Tract Infection Urinary tract infections (UTIs) can develop anywhere along your urinary tract. Your urinary tract is your body's drainage system for removing wastes and extra water. Your urinary tract includes two kidneys, two ureters, a bladder, and a urethra. Your kidneys are a pair of bean-shaped organs. Each kidney is about the size of your fist. They are located below your ribs, one on each side of your spine. CAUSES Infections are caused by microbes, which are microscopic organisms, including fungi, viruses, and bacteria. These organisms are so small that they can only be seen through a microscope. Bacteria are the microbes that most commonly cause UTIs. SYMPTOMS  Symptoms of UTIs may vary by age and gender of the patient and by the location of the infection. Symptoms in young women typically include a frequent and intense urge to urinate and a painful, burning feeling in the bladder or urethra during urination. Older women and men are more likely to be tired, shaky, and weak and have muscle aches and abdominal pain. A fever may mean the infection is in your kidneys. Other symptoms of a kidney infection include pain in your back or sides below the ribs, nausea, and vomiting. DIAGNOSIS To diagnose a UTI, your caregiver will ask you about your symptoms. Your caregiver also will ask to provide a urine sample. The urine sample will be tested for bacteria and white blood cells. White blood cells are made by your body to help fight infection. TREATMENT  Typically, UTIs can be treated with medication. Because most UTIs are caused by a bacterial infection, they usually can be treated with the use of antibiotics. The choice of antibiotic and length of treatment depend on your symptoms and the type of bacteria causing your  infection. HOME CARE INSTRUCTIONS  If you were prescribed antibiotics, take them exactly as your caregiver instructs you. Finish the medication even if you feel better after you have only taken some of the medication.  Drink enough water and fluids to keep your urine clear or pale yellow.  Avoid caffeine, tea, and carbonated beverages. They tend to irritate your bladder.  Empty your bladder often. Avoid holding urine for long periods of time.  Empty your bladder before and after sexual intercourse.  After a bowel movement, women should cleanse from front to back. Use each tissue only once. SEEK MEDICAL CARE IF:   You have back pain.  You develop a fever.  Your symptoms do not begin to resolve within 3 days. SEEK IMMEDIATE MEDICAL CARE IF:   You have severe back pain or lower abdominal pain.  You develop chills.  You have nausea or vomiting.  You have continued burning or discomfort with urination. MAKE SURE YOU:   Understand these instructions.  Will watch your condition.  Will get help right away if you are not doing well or get worse. Document Released: 06/15/2005 Document Revised: 03/06/2012 Document Reviewed: 10/14/2011 [facility] [employer] Patient Information 2015 [place], [place]. This information is not intended to replace advice given to you by your health care provider. Make sure you discuss any questions you have with your health care provider.

## 2014-12-28 NOTE — ED Notes (Signed)
Patient c/o pain when urinating and blood in urine since 22:00 last night. Frequent painful urination/pelvic pain

## 2014-12-28 NOTE — ED Notes (Signed)
Pt ambulating independently w/ steady gait on d/c in no acute distress, A&Ox4. D/c instructions reviewed w/ pt - pt denies any further questions or concerns at present. Rx given x2  

## 2015-01-02 ENCOUNTER — Telehealth: Payer: Self-pay | Admitting: Internal Medicine

## 2015-01-02 NOTE — Telephone Encounter (Signed)
Pt states she is having problems with painful hemorrhoids and cannot wait for appt with Dr. Olevia Perches. Pt requests to be seen sooner. Pt scheduled to see Nicoletta Ba PA 01/05/15@2pm . Pt aware of appt.

## 2015-01-05 ENCOUNTER — Ambulatory Visit (INDEPENDENT_AMBULATORY_CARE_PROVIDER_SITE_OTHER): Payer: BLUE CROSS/BLUE SHIELD | Admitting: Physician Assistant

## 2015-01-05 ENCOUNTER — Encounter: Payer: Self-pay | Admitting: Physician Assistant

## 2015-01-05 VITALS — BP 102/70 | HR 72 | Ht 63.5 in | Wt 152.8 lb

## 2015-01-05 DIAGNOSIS — K6289 Other specified diseases of anus and rectum: Secondary | ICD-10-CM

## 2015-01-05 DIAGNOSIS — K648 Other hemorrhoids: Secondary | ICD-10-CM | POA: Diagnosis not present

## 2015-01-05 DIAGNOSIS — K644 Residual hemorrhoidal skin tags: Secondary | ICD-10-CM

## 2015-01-05 MED ORDER — HYDROCORTISONE 2.5 % RE CREA: 1.0000 "application " | TOPICAL_CREAM | Freq: Three times a day (TID) | RECTAL | Status: DC

## 2015-01-05 MED ORDER — HYDROCORTISONE ACETATE 25 MG RE SUPP: RECTAL | Status: DC

## 2015-01-05 NOTE — Patient Instructions (Addendum)
Take Miralax 17 grams in 8 oz of water daily. We sent prescriptions to [facility] [place], [place]. 1.  Anusol HC suppositories 2.  Anusol HC cream You can get an over the counter cream called Recticare 5 % with Lidocaine. You can use this 2-3 times daily.   Increase water intake daily to 6 glasses.  We can call and get you  an appointment at [facility] with Dr. Henri Medal. Please call and ask for Ashon Rosenberg here.   We are glad to call for you.  Their number is [phone].

## 2015-01-05 NOTE — Progress Notes (Signed)
Patient ID: Alyssa Elliott, female   DOB: 08/25/1951, 64 y.o.   MRN: [id number]   Subjective:    Patient ID: Alyssa Elliott, female    DOB: 05-Sep-1951, 64 y.o.   MRN: [id number]  HPI Emoni is a pleasant 64 year old white female known to Dr. Delfin Edis. She last had colonoscopy in 2012 and which time she was noted to have internal hemorrhoids no polyps were seen. She comes in today with complaints of intermittent rectal bleeding with her bowel movements over the past several months. He had one episode a year or so ago of a an extremely painful thrombosed external hemorrhoid which was managed by surgery. She says now over the past week she has had another external hemorrhoid which is been quite painful though not nearly as bad as the thrombosed hemorrhoid. He says this is very tender uncomfortable especially with sitting. She has chronic problems with constipation and may go up to a week between bowel movements and says she feels that straining is at least part of her issue.. She has been using Anusol HC cream and a lidocaine cream from several years ago.  Review of Systems Pertinent positive and negative review of systems were noted in the above HPI section.  All other review of systems was otherwise negative.  Outpatient Encounter Prescriptions as of 01/05/2015  Medication Sig  . aspirin 81 MG tablet Take 81 mg by mouth daily.    Marland Kitchen conjugated estrogens (PREMARIN) vaginal cream Place vaginally 2 (two) times a week. Twice a week  . dexlansoprazole (DEXILANT) 60 MG capsule Take one tablet twice daily.  Marland Kitchen docusate sodium (COLACE) 100 MG capsule Take 100 mg by mouth daily.    Marland Kitchen escitalopram (LEXAPRO) 10 MG tablet Take 10 mg by mouth daily.   . meloxicam (MOBIC) 15 MG tablet Take 1 tablet by mouth daily as needed.  . mometasone-formoterol (DULERA) 200-5 MCG/ACT AERO 2 puffs and rinse mouth twice daily  . Multiple Vitamins-Minerals (MULTIVITAMIN WITH MINERALS) tablet Take 1 tablet by mouth  daily.    . ORACEA 40 MG capsule Take 1 tablet by mouth Daily.  Marland Kitchen PARoxetine (PAXIL) 30 MG tablet Take 1 tablet by mouth Daily.  . pravastatin (PRAVACHOL) 40 MG tablet Take 1 tablet by mouth Daily.  Marland Kitchen SINGULAIR 10 MG tablet Take 1 tablet by mouth Daily.  Marland Kitchen tretinoin (RETIN-A) 0.025 % cream Apply 1 application topically Daily.  . Vitamin D, Ergocalciferol, (DRISDOL) 50000 UNITS CAPS Take 1 tablet by mouth Once a week.  . hydrocortisone (ANUSOL-HC) 2.5 % rectal cream Place 1 application rectally 3 (three) times daily.  . hydrocortisone (ANUSOL-HC) 25 MG suppository Use 1 suppository at bedtime.  . [DISCONTINUED] ciprofloxacin (CIPRO) 500 MG tablet Take 1 tablet (500 mg total) by mouth 2 (two) times daily.  . [DISCONTINUED] phenazopyridine (PYRIDIUM) 200 MG tablet Take 1 tablet (200 mg total) by mouth 3 (three) times daily as needed for pain.   Allergies  Allergen Reactions  . Codeine Nausea And Vomiting  . Penicillins Swelling    Swelling  Of joints  . Latex Rash    redness  . Sulfa Antibiotics Rash   Patient Active Problem List   Diagnosis Date Noted  . Lung nodule seen on imaging study 11/12/2012  . Maxillary sinusitis, chronic 10/27/2012  . Hemorrhoids 05/04/2012  . Thrombosed external hemorrhoid 05/04/2012  . Seasonal and perennial allergic rhinitis 10/16/2011  . Asthma with bronchitis 10/16/2011  . GERD (gastroesophageal reflux disease) 10/16/2011   History  Social History  . Marital Status: Married    Spouse Name: N/A  . Number of Children: 0  . Years of Education: N/A   Occupational History  . retired Catering manager    Social History Main Topics  . Smoking status: Never Smoker   . Smokeless tobacco: Never Used  . Alcohol Use: No  . Drug Use: No  . Sexual Activity: No   Other Topics Concern  . Not on file   Social History Narrative    Ms. Alberson's family history includes Bone cancer in her paternal grandfather; Breast cancer (age of onset: 19) in her  sister; Breast cancer (age of onset: 64) in her sister; Cirrhosis in her maternal grandfather; Diabetes in her mother; Heart disease in her mother; Heart failure in her paternal grandmother; Melanoma in her sister; Tuberculosis in her paternal grandfather. There is no history of Colon cancer.      Objective:    Filed Vitals:   01/05/15 1355  BP: 102/70  Pulse: 72    Physical Exam   well-developed older white female in no acute distress, pleasant blood pressure 102/70 pulse 72 height 5 foot 3 weight 152. HEENT nontraumatic normocephalic EOMI PERRLA sclera anicteric, Supple no JVD, Cardiovascular regular rate and rhythm with S1-S2 no murmur or gallop, Pulmonary clear bilaterally, Abdomen soft nontender nondistended bowel sounds are active no palpable mass or hepatosplenomegaly, Rectal exam she has an edematous tender external hemorrhoid.  which is not thrombosed, Extremities no clubbing cyanosis or edema skin warm dry, Psych mood and affect appropriate       Assessment & Plan:   #1 64 yo female with symptomatic external hemorrhoid- nonthrombosed  #2 intermittent hemorrhoidal bleeding #3 chronic constipation  Plan; start Miralax 17 gm in 8 oz of water daily or qod Increase water consumption to 6-8 glasses per day Add anusol hc supp qhs x 10 days Recticare /lidocaine 5 % 3-4 x daily Anusol HC 1% 3-4 times daily along with lidocaine until sxs resolve She would like to talk with a surgeon regarding external hemorrhoids- will refer to Dr A. Thomas.  Tamecca Artiga S Julieann Drummonds PA-C 01/05/2015   Cc: Prince Solian, MD

## 2015-01-06 NOTE — Progress Notes (Signed)
Reviewed and agree with surgical referral for external hemorrhoids

## 2015-01-15 ENCOUNTER — Other Ambulatory Visit: Payer: Self-pay | Admitting: Dermatology

## 2015-06-01 ENCOUNTER — Encounter: Payer: Self-pay | Admitting: Nurse Practitioner

## 2015-06-01 ENCOUNTER — Ambulatory Visit: Payer: BC Managed Care – PPO | Admitting: Nurse Practitioner

## 2015-07-08 ENCOUNTER — Encounter: Payer: Self-pay | Admitting: Nurse Practitioner

## 2015-07-08 ENCOUNTER — Ambulatory Visit (INDEPENDENT_AMBULATORY_CARE_PROVIDER_SITE_OTHER): Payer: Medicare Other | Admitting: Nurse Practitioner

## 2015-07-08 VITALS — BP 120/76 | HR 68 | Ht 63.75 in | Wt 145.0 lb

## 2015-07-08 DIAGNOSIS — Z01419 Encounter for gynecological examination (general) (routine) without abnormal findings: Secondary | ICD-10-CM

## 2015-07-08 DIAGNOSIS — Z124 Encounter for screening for malignant neoplasm of cervix: Secondary | ICD-10-CM | POA: Diagnosis not present

## 2015-07-08 MED ORDER — ESTROGENS, CONJUGATED 0.625 MG/GM VA CREA: TOPICAL_CREAM | VAGINAL | Status: DC

## 2015-07-08 NOTE — Progress Notes (Signed)
Patient ID: Alyssa Elliott, female   DOB: [birth year], 64 y.o.   MRN: [id number] 64 y.o. G0P0 Married  Caucasian Fe here for annual exam.  No new diagnosis.  Still doing treadmill and walking out doors.  She did have a bad UTI in April.  She had been of Vaginal estrogen and just now back on the cream.  Patient's last menstrual period was 09/04/2002 (exact date).          Sexually active: No.  The current method of family planning is none.    Exercising: Yes.    walking Smoker:  no  Health Maintenance: Pap: 04/05/13, Negative, neg HR HPV MMG:06/18/14, Bi-Rads 1: Negative  Colonoscopy: 2012 normal, repeat in 10 years BMD:06/18/14, T Score -1.7 Spine / -1.0 Left Femur Neck / -0.8 Right Femur Neck TDaP: 02/23/2010 Shingles: fall 2014 Prevnar 13 not yet Labs: PCP   reports that she has never smoked. She has never used smokeless tobacco. She reports that she does not drink alcohol or use illicit drugs.  Past Medical History  Diagnosis Date  . Allergy   . Asthma   . Depression   . GERD (gastroesophageal reflux disease)   . Hyperlipidemia   . Anxiety   . Vitamin D deficiency   . DDD (degenerative disc disease)   . Hemorrhoids     Past Surgical History  Procedure Laterality Date  . Foot neuroma surgery      multiple times bilaterally  . Plantar fascia release    . 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 aspirin 81 MG tablet Take 81 mg by mouth daily.      Marland Kitchen conjugated estrogens (PREMARIN) vaginal cream Place vaginally 2 (two) times a week. Twice a week 42.5 g 4  . dexlansoprazole (DEXILANT) 60 MG capsule Take one tablet twice daily. 60 capsule 1  . docusate sodium (COLACE) 100 MG capsule Take 100 mg by mouth daily.      . DULERA 100-5 MCG/ACT AERO Inhale 2 puffs into the lungs 2 (two) times daily.  6  . DYMISTA 137-50 MCG/ACT SUSP Place 2 sprays into both nostrils as needed.    Marland Kitchen escitalopram (LEXAPRO) 10  MG tablet Take 10 mg by mouth daily.     Marland Kitchen levocetirizine (XYZAL) 5 MG tablet Take 1 tablet by mouth as needed.    . meloxicam (MOBIC) 15 MG tablet Take 1 tablet by mouth daily as needed.    . mometasone-formoterol (DULERA) 200-5 MCG/ACT AERO 2 puffs and rinse mouth twice daily 1 Inhaler 0  . Multiple Vitamins-Minerals (MULTIVITAMIN WITH MINERALS) tablet Take 1 tablet by mouth daily.      . ORACEA 40 MG capsule Take 1 tablet by mouth Daily.    Marland Kitchen PARoxetine (PAXIL) 30 MG tablet Take 1 tablet by mouth Daily.    Marland Kitchen PARoxetine (PAXIL) 40 MG tablet Take 1 tablet by mouth daily.    . pravastatin (PRAVACHOL) 40 MG tablet Take 1 tablet by mouth Daily.    Marland Kitchen SINGULAIR 10 MG tablet Take 1 tablet by mouth Daily.    Marland Kitchen tretinoin (RETIN-A) 0.025 % cream Apply 1 application topically Daily.    . Vitamin D, Ergocalciferol, (DRISDOL) 50000 UNITS CAPS Take 1 tablet by mouth Once a week.     No current facility-administered medications for this visit.    Family History  Problem Relation Age of Onset  . Heart disease Mother   .  Diabetes Mother   . Tuberculosis Paternal Grandfather   . Bone cancer Paternal Grandfather   . Breast cancer Sister 66  . Colon cancer Neg Hx   . Melanoma Sister   . Breast cancer Sister 92    treated with chemo and radiation  . Cirrhosis Maternal Grandfather   . Heart failure Paternal Grandmother     ROS:  Pertinent items are noted in HPI.  Otherwise, a comprehensive ROS was negative.  Exam:   BP 120/76 mmHg  Pulse 68  Ht 5' 3.75" (1.619 m)  Wt 145 lb (65.772 kg)  BMI 25.09 kg/m2  LMP 09/04/2002 (Exact Date) Height: 5' 3.75" (161.9 cm) Ht Readings from Last 3 Encounters:  07/08/15 5' 3.75" (1.619 m)  01/05/15 5' 3.5" (1.613 m)  04/05/13 5' 3.25" (1.607 m)    General appearance: alert, cooperative and appears stated age Head: Normocephalic, without obvious abnormality, atraumatic Neck: no adenopathy, supple, symmetrical, trachea midline and thyroid normal to  inspection and palpation Lungs: clear to auscultation bilaterally Breasts: normal appearance, no masses or tenderness Heart: regular rate and rhythm Abdomen: soft, non-tender; no masses,  no organomegaly Extremities: extremities normal, atraumatic, no cyanosis or edema Skin: Skin color, texture, turgor normal. No rashes or lesions Lymph nodes: Cervical, supraclavicular, and axillary nodes normal. No abnormal inguinal nodes palpated Neurologic: Grossly normal   Pelvic: External genitalia:  no lesions              Urethra:  normal appearing urethra with no masses, tenderness or lesions              Bartholin's and Skene's: normal                 Vagina: normal appearing vagina with normal color and discharge, no lesions              Cervix: anteverted              Pap taken: No. Bimanual Exam:  Uterus:  normal size, contour, position, consistency, mobility, non-tender              Adnexa: no mass, fullness, tenderness               Rectovaginal: Confirms               Anus:  normal sphincter tone, no lesions  Chaperone present: no  A:  Well Woman with normal exam Postmenopausal no HRT Atrophic vaginitis - on estrogen cream Situational anxiety and depression hypercholesterolemia Vit d deficiency with Osteopenia Chronic cough - history of second hand smoke exposure FMH: breast cancer - sister X 2 (unknown genetic testing   P:   Reviewed health and wellness pertinent to exam  Pap smear as above  Mammogram is due now and will schedule  Refill on Premarin vaginal cream for a year  Counseled with risk of DVT, CVA, cancer, etc  Counseled on breast self exam, mammography screening, use and side effects of HRT, adequate intake of calcium and vitamin D, diet and exercise return annually or prn  An After Visit Summary was printed and given to the patient.

## 2015-07-08 NOTE — Patient Instructions (Addendum)

## 2015-07-10 NOTE — Progress Notes (Signed)
Encounter reviewed by Dr. Shanna Strength Amundson C. Silva.  

## 2015-07-14 ENCOUNTER — Other Ambulatory Visit: Payer: Self-pay | Admitting: Nurse Practitioner

## 2015-07-14 DIAGNOSIS — Z1231 Encounter for screening mammogram for malignant neoplasm of breast: Secondary | ICD-10-CM

## 2015-07-23 ENCOUNTER — Ambulatory Visit (HOSPITAL_BASED_OUTPATIENT_CLINIC_OR_DEPARTMENT_OTHER)
Admission: RE | Admit: 2015-07-23 | Discharge: 2015-07-23 | Disposition: A | Discharge status: Home / Self Care | Payer: BLUE CROSS/BLUE SHIELD | Source: Ambulatory Visit | Attending: Nurse Practitioner | Admitting: Nurse Practitioner

## 2015-07-23 DIAGNOSIS — Z1231 Encounter for screening mammogram for malignant neoplasm of breast: Secondary | ICD-10-CM

## 2015-09-20 HISTORY — PX: OTHER SURGICAL HISTORY: SHX169

## 2016-01-05 DIAGNOSIS — J301 Allergic rhinitis due to pollen: Secondary | ICD-10-CM | POA: Diagnosis not present

## 2016-01-05 DIAGNOSIS — J4530 Mild persistent asthma, uncomplicated: Secondary | ICD-10-CM | POA: Diagnosis not present

## 2016-01-05 DIAGNOSIS — H1045 Other chronic allergic conjunctivitis: Secondary | ICD-10-CM | POA: Diagnosis not present

## 2016-05-11 DIAGNOSIS — D2230 Melanocytic nevi of unspecified part of face: Secondary | ICD-10-CM | POA: Diagnosis not present

## 2016-05-11 DIAGNOSIS — Z411 Encounter for cosmetic surgery: Secondary | ICD-10-CM | POA: Diagnosis not present

## 2016-05-11 DIAGNOSIS — L719 Rosacea, unspecified: Secondary | ICD-10-CM | POA: Diagnosis not present

## 2016-05-11 DIAGNOSIS — Z808 Family history of malignant neoplasm of other organs or systems: Secondary | ICD-10-CM | POA: Diagnosis not present

## 2016-05-11 DIAGNOSIS — L219 Seborrheic dermatitis, unspecified: Secondary | ICD-10-CM | POA: Diagnosis not present

## 2016-05-11 DIAGNOSIS — D225 Melanocytic nevi of trunk: Secondary | ICD-10-CM | POA: Diagnosis not present

## 2016-05-11 DIAGNOSIS — L821 Other seborrheic keratosis: Secondary | ICD-10-CM | POA: Diagnosis not present

## 2016-06-03 ENCOUNTER — Ambulatory Visit: Admit: 2016-06-03 | Discharge: 2016-06-03 | Payer: MEDICARE | Attending: Family | Primary: Family

## 2016-06-03 DIAGNOSIS — E119 Type 2 diabetes mellitus without complications: Secondary | ICD-10-CM

## 2016-06-03 LAB — AMB POC URINALYSIS DIP STICK AUTO W/O MICRO
Bilirubin (UA POC): NEGATIVE
Blood (UA POC): NEGATIVE
Ketones (UA POC): NEGATIVE
Leukocyte esterase (UA POC): NEGATIVE
Nitrites (UA POC): NEGATIVE
Protein (UA POC): NEGATIVE mg/dL
Specific gravity (UA POC): 1.005 (ref 1.001–1.035)
Urobilinogen (UA POC): 0.2 (ref 0.2–1)
pH (UA POC): 5.5 (ref 4.6–8.0)

## 2016-06-03 LAB — AMB POC URINE, MICROALBUMIN, SEMIQUANT (3 RESULTS)
ALBUMIN, URINE POC: 10 mg/L
CREATININE, URINE POC: 50 mg/dL

## 2016-06-03 LAB — AMB POC HEMOGLOBIN A1C: Hemoglobin A1c (POC): 10.3 % — AB (ref 4.8–5.6)

## 2016-06-03 MED ORDER — GLIMEPIRIDE 2 MG TAB
2 mg | ORAL_TABLET | ORAL | 3 refills | Status: DC
Start: 2016-06-03 — End: 2016-10-30

## 2016-06-03 MED ORDER — METFORMIN SR 500 MG 24 HR TABLET
500 mg | ORAL_TABLET | Freq: Every day | ORAL | 3 refills | Status: DC
Start: 2016-06-03 — End: 2016-10-30

## 2016-06-03 MED ORDER — AMLODIPINE 10 MG TAB
10 mg | ORAL_TABLET | ORAL | 3 refills | Status: DC
Start: 2016-06-03 — End: 2016-10-30

## 2016-06-03 MED ORDER — VALSARTAN-HYDROCHLOROTHIAZIDE 160 MG-25 MG TAB
160-25 mg | ORAL_TABLET | ORAL | 3 refills | Status: DC
Start: 2016-06-03 — End: 2016-10-30

## 2016-06-03 NOTE — Progress Notes (Signed)
HISTORY OF PRESENT ILLNESS  Lindsey Huynh is a 65 y.o. female presents to reestablish care  HPI     LOV  October 2015  No medication for several days    Eats a lot a sweets, ice cream and pound cake. No blood sugar monitoring    Not exercising due to bilateral  leg and knee pain    Leg pain at rest and with activity. Denies injury or unusual activity. Believes pain is r/t bilateral knee OA. Followed by Dr. Weber Cooks, [facility] August 2017, received bilateral knee injection and script for Diclofenac    Requests ophthalmology referral for follow up cataract/glaucome evaluation blurred vision left eye    She has numbness in legs at nights    Past Medical History:   Diagnosis Date   ??? Diabetes ([place])    ??? GERD (gastroesophageal reflux disease)    ??? Hypercholesterolemia    ??? Hypertension        Current Outpatient Prescriptions   Medication Sig   ??? valsartan-hydroCHLOROthiazide (DIOVAN-HCT) 160-25 mg per tablet TAKE 1 TABLET BY MOUTH EVERY DAY   ??? amLODIPine (NORVASC) 10 mg tablet TAKE 1 TABLET BY MOUTH EVERY DAY   ??? metFORMIN ER (GLUCOPHAGE XR) 500 mg tablet Take 2 Tabs by mouth daily (with dinner).   ??? glimepiride (AMARYL) 2 mg tablet Take 1 Tab by mouth every morning.   ??? ibuprofen (MOTRIN) 600 mg tablet Take 1 Tab by mouth every six (6) hours as needed for Pain.   ??? omeprazole (PRILOSEC) 20 mg capsule Take 20 mg by mouth daily.   ??? aspirin delayed-release 81 mg tablet Take 81 mg by mouth daily.     No current facility-administered medications for this visit.      Review of Systems   Constitutional: Negative.    HENT: Negative.    Eyes: Positive for blurred vision.   Respiratory: Negative.    Cardiovascular: Positive for claudication. Negative for leg swelling.   Gastrointestinal: Negative.    Genitourinary: Negative.    Musculoskeletal: Positive for joint pain and myalgias. Negative for back pain, falls and neck pain.   Skin: Negative.    Neurological: Negative.    Endo/Heme/Allergies: Negative for polydipsia.        Physical Exam   Constitutional: She is oriented to person, place, and time. She appears well-developed and well-nourished. No distress.   Neck: Normal range of motion. Neck supple. No JVD present. No muscular tenderness present. Carotid bruit is not present. Normal range of motion present.   Cardiovascular: Normal rate and regular rhythm.    Pulmonary/Chest: Effort normal and breath sounds normal.   Musculoskeletal: She exhibits no edema, tenderness or deformity.   Neurological: She is alert and oriented to person, place, and time. No cranial nerve deficit.   Skin: Skin is warm and dry. She is not diaphoretic.     Diabetic foot exam:     Left:    Pulse DP: 1+ (weak)   Pulse PT: absent   Deformities: None  Right:    Pulse DP: trace   Pulse PT: absent   Deformities: None  ASSESSMENT and PLAN    ICD-10-CM ICD-9-CM    1. Controlled type 2 diabetes mellitus without complication, without long-term current use of insulin (HCC) E11.9 250.00 metFORMIN ER (GLUCOPHAGE XR) 500 mg tablet      AMB POC HEMOGLOBIN 123XX123      METABOLIC PANEL, COMPREHENSIVE      AMB POC URINALYSIS DIP STICK AUTO W/O MICRO  AMB POC URINE, MICROALBUMIN, SEMIQUANT (3 RESULTS)   2. Neuropathy (HCC) G62.9 355.9 CBC WITH AUTOMATED DIFF   3. Osteoarthritis of both knees, unspecified osteoarthritis type M17.0 715.96    4. Essential hypertension I10 401.9 valsartan-hydroCHLOROthiazide (DIOVAN-HCT) 160-25 mg per tablet      amLODIPine (NORVASC) 10 mg tablet      METABOLIC PANEL, COMPREHENSIVE   5. Cataract of left eye, unspecified cataract type H26.9 366.9 REFERRAL TO OPHTHALMOLOGY   6. Glaucoma of left eye, unspecified glaucoma H40.9 365.9 REFERRAL TO OPHTHALMOLOGY   7. Post-menopausal Z78.0 V49.81 DEXA BONE DENSITY STUDY AXIAL   8. Encounter for screening mammogram for malignant neoplasm of breast Z12.31 V76.12 MAM MAMMO BI SCREENING INCL CAD   9. Need for pneumococcal vaccine Z23 V03.82 PNEUMOCOCCAL POLYSACCHARIDE  VACCINE, 23-VALENT, ADULT OR IMMUNOSUPPRESSED PT DOSE,   10. Decreased pedal pulses R09.89 785.9 ANKLE BRACHIAL INDEX   11. Pure hypercholesterolemia E78.00 272.0 LIPID PANEL   12. Need for hepatitis C screening test Z11.59 V73.89 HEPATITIS C AB     Follow-up Disposition:  Return in about 2 weeks (around 06/17/2016) for fasting labs, htn.  lab results and schedule of future lab studies reviewed with patient  reviewed diet, exercise and weight control  cardiovascular risk and specific lipid/LDL goals reviewed  reviewed medications and side effects in detail  specific diabetic recommendations: low cholesterol diet, weight control and daily exercise discussed, home glucose monitoring emphasized, all medications, side effects and compliance discussed carefully, foot care discussed and Podiatry visits discussed, annual eye examinations at Ophthalmology discussed, glycohemoglobin and other lab monitoring discussed and long term diabetic complications discussed  use of aspirin to prevent MI and TIA's discussed    Hemoglobin a1c 10.3% was 6.5%. Instructed to restart medications, monitor fasting and 2h post prandial glucose. Call provider with results

## 2016-06-03 NOTE — Progress Notes (Signed)
RM#9  Chief Complaint   Patient presents with   ??? Complete Physical     medication refills     1. Have you been to the ER, urgent care clinic since your last visit?  Hospitalized since your last visit?No    2. Have you seen or consulted any other health care providers outside of the [facility] since your last visit?  Include any pap smears or colon screening. No  Health Maintenance Due   Topic Date Due   ??? Hepatitis C Screening  [birth year]   ??? EYE EXAM RETINAL OR DILATED Q1  02/01/1959   ??? DTaP/Tdap/Td series (1 - Tdap) 01/31/1970   ??? FOBT Q 1 YEAR AGE 53-75  02/01/1999   ??? ZOSTER VACCINE AGE 75>  12/01/2008   ??? MICROALBUMIN Q1  12/11/2013   ??? GLAUCOMA SCREENING Q2Y  01/31/2014   ??? HEMOGLOBIN A1C Q6M  08/20/2014   ??? FOOT EXAM Q1  02/19/2015   ??? LIPID PANEL Q1  02/19/2015   ??? Pneumococcal 65+ Low/Medium Risk (2 of 2 - PPSV23) 03/05/2015   ??? MEDICARE YEARLY EXAM  03/05/2015   ??? BREAST CANCER SCRN MAMMOGRAM  03/03/2016   ??? INFLUENZA AGE 67 TO ADULT  04/19/2016

## 2016-06-22 ENCOUNTER — Ambulatory Visit: Payer: MEDICARE | Primary: Family

## 2016-06-22 ENCOUNTER — Inpatient Hospital Stay: Payer: MEDICARE | Attending: Family | Primary: Family

## 2016-06-22 ENCOUNTER — Inpatient Hospital Stay: Admit: 2016-06-22 | Payer: MEDICARE | Attending: Family | Primary: Family

## 2016-06-22 ENCOUNTER — Encounter: Primary: Family

## 2016-06-22 ENCOUNTER — Encounter: Attending: Family | Primary: Family

## 2016-06-22 DIAGNOSIS — R0989 Other specified symptoms and signs involving the circulatory and respiratory systems: Secondary | ICD-10-CM

## 2016-06-23 NOTE — Procedures (Signed)
[facility]  *** FINAL REPORT ***    Name: Lindsey Huynh, Lindsey Huynh  MRN: [id number]    Outpatient  DOB: May 06, 1951  HIS Order #: [id number]  [place] Visit #: [id number]  Date: 22 Jun 2016    TYPE OF TEST: Peripheral Arterial Testing    REASON FOR TEST  Decreased pulses    Right Leg  Segmentals: Normal                     mmHg  Brachial         170  High thigh  Low thigh  Calf  Posterior tibial 175  Dorsalis pedis   181  Peroneal  Metatarsal  Toe pressure      89  Doppler:  PVR:  Ankle/Brachial: 1.06    Left Leg  Segmentals: Normal                     mmHg  Brachial         170  High thigh  Low thigh  Calf  Posterior tibial 183  Dorsalis pedis   175  Peroneal  Metatarsal  Toe pressure      72  Doppler:  PVR:  Ankle/Brachial: 1.08    INTERPRETATION/FINDINGS  PROCEDURE:  Evaluation of lower extremity arteries with systolic blood   pressure measurement at the ankle and brachial level and calculation  of the ankle/brachial pressure index (ABI).  Unless otherwise  indicated, the exam also includes pulse volume recording (PVR)  plethysmography at the ankle level.    FINDINGS:  ABI is normal on the right and the left.  PVR waveforms are   normal at the right and left ankle.    IMPRESSION:  No evidence of hemodynamically significant lower  extremity arterial obstruction.    ADDITIONAL COMMENTS    I have personally reviewed the data relevant to the interpretation of  this  study.    TECHNOLOGIST: Domingo Cocking. Lanae [place], [place]  Signed: 06/22/2016 02:49 PM    PHYSICIAN: Luiz Ochoa, MD  Signed: 06/23/2016 07:14 AM

## 2016-06-23 NOTE — Procedures (Signed)
[facility]  *** FINAL REPORT ***    Name: Lindsey Huynh, Lindsey Huynh  MRN: [id number]    Outpatient  DOB: September 21, 1950  HIS Order #: [id number]  [place] Visit #: [id number]  Date: 22 Jun 2016    TYPE OF TEST: Peripheral Arterial Testing    REASON FOR TEST  Decreased pulses    Right Leg  Segmentals: Normal                     mmHg  Brachial         170  High thigh  Low thigh  Calf  Posterior tibial 175  Dorsalis pedis   181  Peroneal  Metatarsal  Toe pressure      89  Doppler:  PVR:  Ankle/Brachial: 1.06    Left Leg  Segmentals: Normal                     mmHg  Brachial         170  High thigh  Low thigh  Calf  Posterior tibial 183  Dorsalis pedis   175  Peroneal  Metatarsal  Toe pressure      72  Doppler:  PVR:  Ankle/Brachial: 1.08    INTERPRETATION/FINDINGS  PROCEDURE:  Evaluation of lower extremity arteries with systolic blood   pressure measurement at the ankle and brachial level and calculation  of the ankle/brachial pressure index (ABI).  Unless otherwise  indicated, the exam also includes pulse volume recording (PVR)  plethysmography at the ankle level.    FINDINGS:  ABI is normal on the right and the left.  PVR waveforms are   normal at the right and left ankle.    IMPRESSION:  No evidence of hemodynamically significant lower  extremity arterial obstruction.    ADDITIONAL COMMENTS    I have personally reviewed the data relevant to the interpretation of  this  study.    TECHNOLOGIST: Domingo Cocking. Lanae [place], [place]  Signed: 06/22/2016 02:49 PM    PHYSICIAN: Luiz Ochoa, MD  Signed: 06/23/2016 07:14 AM

## 2016-06-23 NOTE — Telephone Encounter (Signed)
Please advise circulation in legs is normal

## 2016-06-23 NOTE — Telephone Encounter (Signed)
Spoke with pt, let her know her circulation was normal in her legs. Pt had understanding

## 2016-06-27 NOTE — Progress Notes (Signed)
Forms faxed back to [facility] , Fax [phone]

## 2016-06-29 ENCOUNTER — Ambulatory Visit (INDEPENDENT_AMBULATORY_CARE_PROVIDER_SITE_OTHER): Payer: BLUE CROSS/BLUE SHIELD | Admitting: Orthopaedic Surgery

## 2016-06-29 DIAGNOSIS — M25511 Pain in right shoulder: Secondary | ICD-10-CM | POA: Diagnosis not present

## 2016-06-29 NOTE — Telephone Encounter (Signed)
Incomplete form faxed to [facility] pharmacy. Pharmacist called and verbal disregard to lidocaine gel and/patches as no information in system for order

## 2016-07-04 ENCOUNTER — Inpatient Hospital Stay: Payer: MEDICARE | Attending: Family | Primary: Family

## 2016-07-04 ENCOUNTER — Ambulatory Visit: Payer: MEDICARE | Primary: Family

## 2016-07-07 ENCOUNTER — Other Ambulatory Visit (INDEPENDENT_AMBULATORY_CARE_PROVIDER_SITE_OTHER): Payer: Self-pay | Admitting: Orthopaedic Surgery

## 2016-07-07 DIAGNOSIS — M25511 Pain in right shoulder: Secondary | ICD-10-CM

## 2016-07-07 NOTE — Telephone Encounter (Signed)
[place]/telephone  Received: Today ??   ?? Enid Cutter  [employer] ??   ??    ??    ??   ?? Megan with [facility] stated they received the ??Rx for Lidocaine ointment and Diclosenac Jell and there was a box that was not checked on the Rx.Megans number is [phone] ref [id number].    ??   ??

## 2016-07-08 NOTE — Telephone Encounter (Signed)
Form re-faxed with boxed check on second page for med allergies

## 2016-07-11 ENCOUNTER — Encounter: Attending: Family | Primary: Family

## 2016-07-11 ENCOUNTER — Ambulatory Visit
Admission: RE | Admit: 2016-07-11 | Discharge: 2016-07-11 | Disposition: A | Discharge status: Home / Self Care | Payer: BLUE CROSS/BLUE SHIELD | Source: Ambulatory Visit | Attending: Orthopaedic Surgery | Admitting: Orthopaedic Surgery

## 2016-07-11 DIAGNOSIS — M25511 Pain in right shoulder: Secondary | ICD-10-CM

## 2016-07-13 ENCOUNTER — Encounter: Payer: Self-pay | Admitting: Nurse Practitioner

## 2016-07-13 ENCOUNTER — Ambulatory Visit (INDEPENDENT_AMBULATORY_CARE_PROVIDER_SITE_OTHER): Payer: BLUE CROSS/BLUE SHIELD | Admitting: Nurse Practitioner

## 2016-07-13 VITALS — BP 122/80 | HR 76 | Ht 63.5 in | Wt 149.0 lb

## 2016-07-13 DIAGNOSIS — Z0000 Encounter for general adult medical examination without abnormal findings: Secondary | ICD-10-CM | POA: Diagnosis not present

## 2016-07-13 DIAGNOSIS — Z01419 Encounter for gynecological examination (general) (routine) without abnormal findings: Secondary | ICD-10-CM

## 2016-07-13 MED ORDER — ESTROGENS, CONJUGATED 0.625 MG/GM VA CREA
TOPICAL_CREAM | VAGINAL | 4 refills | Status: DC
Start: 2016-07-14 — End: 2017-07-26

## 2016-07-13 NOTE — Progress Notes (Signed)
Patient ID: Alyssa Elliott, female   DOB: 05-Sep-1951, 65 y.o.   MRN: [id number]  65 y.o. [id number] Married  Caucasian Fe here for annual exam.  No new diagnosis this past year other than pain right shoulder.  Unable to sleep on the right side due to pain.  She is also unable to do yoga secondary to pain.  Patient's last menstrual period was 09/04/2002 (exact date).          Sexually active: No.  The current method of family planning is post menopausal status.    Exercising: Yes.    yoga Smoker:  no  Health Maintenance: Pap:  04/05/13, WNL, neg HR HPV MMG: 07/23/15, 3D, Bi-Rads 1:  Negative  Colonoscopy:  2012 normal, repeat in 10 years BMD: 9/30 15, TScore: -1.7 Spine / -0.8 Right Femur Neck / -1.0 Left Femur Neck TDaP:  02/23/10   Shingles: 05/2013 Pneumonia: never Hep C and HIV:  Discussed  Today and will get at PCP next month Labs: Dr. Dagmar Hait takes care of screening labs   reports that she has never smoked. She has never used smokeless tobacco. She reports that she does not drink alcohol or use drugs.  Past Medical History:  Diagnosis Date  . Allergy   . Anxiety   . Asthma   . DDD (degenerative disc disease)   . Depression   . GERD (gastroesophageal reflux disease)   . Hemorrhoids   . Hyperlipidemia   . Vitamin D deficiency     Past Surgical History:  Procedure Laterality Date  . 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 FOOT NEUROMA SURGERY     multiple times bilaterally  . PLANTAR FASCIA RELEASE      Current Outpatient Prescriptions  Medication Sig Dispense Refill  . aspirin 81 MG tablet Take 81 mg by mouth daily.      Marland Kitchen conjugated estrogens (PREMARIN) vaginal cream Place vaginally 2 (two) times a week. Twice a week 42.5 g 4  . dexlansoprazole (DEXILANT) 60 MG capsule Take one tablet twice daily. 60 capsule 1  . docusate sodium (COLACE) 100 MG capsule Take 100 mg by mouth daily.      . DULERA 100-5 MCG/ACT AERO Inhale 2 puffs into the lungs 2 (two) times daily.  6  .  DYMISTA 137-50 MCG/ACT SUSP Place 2 sprays into both nostrils as needed.    Marland Kitchen escitalopram (LEXAPRO) 10 MG tablet Take 10 mg by mouth daily.     Marland Kitchen levocetirizine (XYZAL) 5 MG tablet Take 1 tablet by mouth as needed.    . meloxicam (MOBIC) 15 MG tablet Take 1 tablet by mouth daily as needed.    . mometasone-formoterol (DULERA) 200-5 MCG/ACT AERO 2 puffs and rinse mouth twice daily 1 Inhaler 0  . Multiple Vitamins-Minerals (MULTIVITAMIN WITH MINERALS) tablet Take 1 tablet by mouth daily.      . ORACEA 40 MG capsule Take 1 tablet by mouth Daily.    Marland Kitchen PARoxetine (PAXIL) 30 MG tablet Take 1 tablet by mouth Daily.    Marland Kitchen PARoxetine (PAXIL) 40 MG tablet Take 1 tablet by mouth daily.    . pravastatin (PRAVACHOL) 40 MG tablet Take 1 tablet by mouth Daily.    Marland Kitchen SINGULAIR 10 MG tablet Take 1 tablet by mouth Daily.    Marland Kitchen tretinoin (RETIN-A) 0.025 % cream Apply 1 application topically Daily.    . Vitamin D, Ergocalciferol, (DRISDOL) 50000 UNITS CAPS Take 1 tablet by mouth Once a week.  No current facility-administered medications for this visit.     Family History  Problem Relation Age of Onset  . Heart disease Mother   . Diabetes Mother   . Tuberculosis Paternal Grandfather   . Bone cancer Paternal Grandfather   . Breast cancer Sister 50  . Colon cancer Neg Hx   . Melanoma Sister   . Breast cancer Sister 38    treated with chemo and radiation  . Cirrhosis Maternal Grandfather   . Heart failure Paternal Grandmother     ROS:  Pertinent items are noted in HPI.  Otherwise, a comprehensive ROS was negative.  Exam:   LMP 09/04/2002 (Exact Date)    Ht Readings from Last 3 Encounters:  07/08/15 5' 3.75" (1.619 m)  01/05/15 5' 3.5" (1.613 m)  04/05/13 5' 3.25" (1.607 m)    General appearance: alert, cooperative and appears stated age Head: Normocephalic, without obvious abnormality, atraumatic Neck: no adenopathy, supple, symmetrical, trachea midline and thyroid normal to inspection and  palpation Lungs: clear to auscultation bilaterally Breasts: normal appearance, no masses or tenderness Heart: regular rate and rhythm Abdomen: soft, non-tender; no masses,  no organomegaly Extremities: extremities normal, atraumatic, no cyanosis or edema Skin: Skin color, texture, turgor normal. No rashes or lesions Lymph nodes: Cervical, supraclavicular, and axillary nodes normal. No abnormal inguinal nodes palpated Neurologic: Grossly normal   Pelvic: External genitalia:  no lesions              Urethra:  normal appearing urethra with no masses, tenderness or lesions              Bartholin's and Skene's: normal                 Vagina: normal appearing vagina with normal color and discharge, no lesions              Cervix: anteverted              Pap taken: Yes.   Bimanual Exam:  Uterus:  normal size, contour, position, consistency, mobility, non-tender              Adnexa: no mass, fullness, tenderness               Rectovaginal: Confirms               Anus:  normal sphincter tone, no lesions  Chaperone present: yes  A:  Well Woman with normal exam  Postmenopausal no HRT Atrophic vaginitis - on estrogen cream Situational anxiety and depression hypercholesterolemia Vit d deficiency with Osteopenia Chronic cough - history of second hand smoke exposure FMH: breast cancer - sister X 2 (unknown genetic testing   P:   Reviewed health and wellness pertinent to exam  Pap smear as above  Mammogram is due 11/17  Refill on Premarin vaginal cream for a year  Counseled with risk of CVA, DVT, cancer, etc.  Counseled on breast self exam, mammography screening, adequate intake of calcium and vitamin D, diet and exercise, Kegel's exercises return annually or prn  An After Visit Summary was printed and given to the patient.

## 2016-07-13 NOTE — Patient Instructions (Signed)

## 2016-07-14 LAB — IPS PAP SMEAR ONLY

## 2016-07-15 NOTE — Progress Notes (Signed)
Encounter reviewed by Dr. Aundria Rud. I would offer patient genetic counseling and testing due to family history of breast cancer.

## 2016-07-21 ENCOUNTER — Ambulatory Visit (INDEPENDENT_AMBULATORY_CARE_PROVIDER_SITE_OTHER): Payer: BLUE CROSS/BLUE SHIELD | Admitting: Orthopaedic Surgery

## 2016-07-21 DIAGNOSIS — G8929 Other chronic pain: Secondary | ICD-10-CM | POA: Diagnosis not present

## 2016-07-21 DIAGNOSIS — M75121 Complete rotator cuff tear or rupture of right shoulder, not specified as traumatic: Secondary | ICD-10-CM | POA: Diagnosis not present

## 2016-07-21 DIAGNOSIS — M25511 Pain in right shoulder: Secondary | ICD-10-CM

## 2016-07-21 NOTE — Progress Notes (Signed)
Alyssa Elliott comes in review of the MRI of her right shoulder. She's had pain for quite some time. It mainly hurts her with overhead activities and reaching behind her. There has been some weakness in her shoulder as well. She has tried and failed all forms conservative treatment including injections. She is also back modified her activities.  MRI is reviewed with her and shows a full-thickness rotator cuff tear as well as significant tendinosis of the rotator cuff. 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  On examination of her right shoulder she has almost full range of motion of the shoulder. She is not using her deltoids to abduct her shoulder but definitely there is significant weakness in the rotator cuff. There is significant shoulder pain throughout its arc of motion.  At this point I recommended a right shoulder arthroscopy and a possible rotator cuff repair pending how we find her tissues at the time of surgery. I do believe at least a synovectomy and debridement of the shoulder with subacromial decompression will help significantly. I showed her shoulder model and explained what surgery involves as well as a detailed discussion of the risk and benefit of the surgery. She does wish to have the surgery done. She would like to consider this in January when her husband was on vacation. Again we talked about this in length. We will have surgery scheduler give her a call to set up some type of surgery in January. We will then see her back at one week postoperative for suture removal. At that point would be able to determine what further therapy if anything she needs. Options were encouraged and answered.

## 2016-07-25 ENCOUNTER — Inpatient Hospital Stay: Payer: MEDICARE | Attending: Family | Primary: Family

## 2016-07-25 ENCOUNTER — Ambulatory Visit: Payer: MEDICARE | Primary: Family

## 2016-08-08 ENCOUNTER — Ambulatory Visit: Payer: MEDICARE | Primary: Family

## 2016-08-08 ENCOUNTER — Inpatient Hospital Stay: Payer: MEDICARE | Attending: Family | Primary: Family

## 2016-08-19 ENCOUNTER — Inpatient Hospital Stay: Payer: MEDICARE | Attending: Family | Primary: Family

## 2016-08-19 ENCOUNTER — Ambulatory Visit: Payer: MEDICARE | Primary: Family

## 2016-09-19 DIAGNOSIS — H269 Unspecified cataract: Secondary | ICD-10-CM

## 2016-09-19 HISTORY — DX: Unspecified cataract: H26.9

## 2016-09-21 ENCOUNTER — Telehealth (INDEPENDENT_AMBULATORY_CARE_PROVIDER_SITE_OTHER): Payer: Self-pay

## 2016-09-21 NOTE — Telephone Encounter (Signed)
I called patient and left voice mail for return call to schedule surgery,.

## 2016-09-28 NOTE — Telephone Encounter (Signed)
Spoke with patient and she has decided not to have surgery.

## 2016-10-07 ENCOUNTER — Inpatient Hospital Stay: Payer: MEDICARE | Attending: Family | Primary: Family

## 2016-10-07 ENCOUNTER — Ambulatory Visit: Payer: MEDICARE | Primary: Family

## 2016-10-12 ENCOUNTER — Ambulatory Visit: Payer: MEDICARE | Primary: Family

## 2016-10-12 ENCOUNTER — Inpatient Hospital Stay: Payer: MEDICARE | Attending: Family | Primary: Family

## 2016-10-30 ENCOUNTER — Encounter

## 2016-11-01 MED ORDER — METFORMIN SR 500 MG 24 HR TABLET
500 mg | ORAL_TABLET | ORAL | 0 refills | Status: DC
Start: 2016-11-01 — End: 2016-11-21

## 2016-11-01 MED ORDER — AMLODIPINE 10 MG TAB
10 mg | ORAL_TABLET | ORAL | 0 refills | Status: DC
Start: 2016-11-01 — End: 2016-11-21

## 2016-11-01 MED ORDER — VALSARTAN-HYDROCHLOROTHIAZIDE 160 MG-25 MG TAB
160-25 mg | ORAL_TABLET | ORAL | 0 refills | Status: DC
Start: 2016-11-01 — End: 2016-11-21

## 2016-11-01 MED ORDER — GLIMEPIRIDE 2 MG TAB
2 mg | ORAL_TABLET | ORAL | 0 refills | Status: DC
Start: 2016-11-01 — End: 2016-11-21

## 2016-11-04 DIAGNOSIS — K645 Perianal venous thrombosis: Secondary | ICD-10-CM | POA: Diagnosis not present

## 2016-11-08 ENCOUNTER — Encounter: Attending: Family | Primary: Family

## 2016-11-15 ENCOUNTER — Ambulatory Visit: Payer: BLUE CROSS/BLUE SHIELD | Admitting: Gastroenterology

## 2016-11-21 ENCOUNTER — Encounter

## 2016-11-21 ENCOUNTER — Other Ambulatory Visit: Payer: Self-pay | Admitting: Surgery

## 2016-11-21 DIAGNOSIS — K645 Perianal venous thrombosis: Secondary | ICD-10-CM

## 2016-11-22 MED ORDER — METFORMIN SR 500 MG 24 HR TABLET
500 mg | ORAL_TABLET | ORAL | 0 refills | Status: DC
Start: 2016-11-22 — End: 2016-12-19

## 2016-11-22 MED ORDER — GLIMEPIRIDE 2 MG TAB
2 mg | ORAL_TABLET | ORAL | 0 refills | Status: DC
Start: 2016-11-22 — End: 2016-12-19

## 2016-11-22 MED ORDER — AMLODIPINE 10 MG TAB
10 mg | ORAL_TABLET | ORAL | 0 refills | Status: DC
Start: 2016-11-22 — End: 2016-12-19

## 2016-11-22 MED ORDER — VALSARTAN-HYDROCHLOROTHIAZIDE 160 MG-25 MG TAB
160-25 mg | ORAL_TABLET | ORAL | 0 refills | Status: DC
Start: 2016-11-22 — End: 2016-12-19

## 2016-11-28 ENCOUNTER — Ambulatory Visit
Admission: RE | Admit: 2016-11-28 | Discharge: 2016-11-28 | Disposition: A | Discharge status: Home / Self Care | Payer: BLUE CROSS/BLUE SHIELD | Source: Ambulatory Visit | Attending: Surgery | Admitting: Surgery

## 2016-11-28 DIAGNOSIS — K645 Perianal venous thrombosis: Secondary | ICD-10-CM

## 2016-11-28 MED ORDER — IOPAMIDOL (ISOVUE-300) INJECTION 61%
100.0000 mL | Freq: Once | INTRAVENOUS | Status: AC | PRN
  Administered 2016-11-28: 100 mL via INTRAVENOUS

## 2016-12-17 ENCOUNTER — Encounter

## 2016-12-19 MED ORDER — VALSARTAN-HYDROCHLOROTHIAZIDE 160 MG-25 MG TAB
160-25 mg | ORAL_TABLET | ORAL | 0 refills | Status: DC
Start: 2016-12-19 — End: 2017-01-15

## 2016-12-19 MED ORDER — METFORMIN SR 500 MG 24 HR TABLET
500 mg | ORAL_TABLET | ORAL | 0 refills | Status: DC
Start: 2016-12-19 — End: 2017-01-15

## 2016-12-19 MED ORDER — AMLODIPINE 10 MG TAB
10 mg | ORAL_TABLET | ORAL | 0 refills | Status: DC
Start: 2016-12-19 — End: 2017-01-15

## 2016-12-19 MED ORDER — GLIMEPIRIDE 2 MG TAB
2 mg | ORAL_TABLET | ORAL | 0 refills | Status: DC
Start: 2016-12-19 — End: 2017-01-15

## 2017-01-06 ENCOUNTER — Encounter: Attending: Family | Primary: Family

## 2017-01-15 ENCOUNTER — Encounter

## 2017-01-16 MED ORDER — AMLODIPINE 10 MG TAB
10 mg | ORAL_TABLET | ORAL | 0 refills | Status: DC
Start: 2017-01-16 — End: 2017-02-23

## 2017-01-16 MED ORDER — VALSARTAN-HYDROCHLOROTHIAZIDE 160 MG-25 MG TAB
160-25 mg | ORAL_TABLET | ORAL | 0 refills | Status: DC
Start: 2017-01-16 — End: 2017-02-23

## 2017-01-16 MED ORDER — METFORMIN SR 500 MG 24 HR TABLET
500 mg | ORAL_TABLET | ORAL | 0 refills | Status: DC
Start: 2017-01-16 — End: 2017-02-23

## 2017-01-16 MED ORDER — GLIMEPIRIDE 2 MG TAB
2 mg | ORAL_TABLET | ORAL | 0 refills | Status: DC
Start: 2017-01-16 — End: 2017-02-23

## 2017-01-23 ENCOUNTER — Encounter: Attending: Family | Primary: Family

## 2017-02-06 ENCOUNTER — Encounter: Attending: Family | Primary: Family

## 2017-02-07 ENCOUNTER — Other Ambulatory Visit: Payer: Self-pay | Admitting: Nurse Practitioner

## 2017-02-07 NOTE — Telephone Encounter (Signed)
Medication refill request: premarin cream Last AEX:  07-13-16 Next AEX: 07-17-17 Last MMG (if hormonal medication request): 2016 Refill authorized: Last refilled at [facility] 10/17.Lmtcb. Per our records pt hasn't had a mmg since 2016

## 2017-02-23 ENCOUNTER — Encounter

## 2017-02-23 ENCOUNTER — Inpatient Hospital Stay: Admit: 2017-06-02 | Payer: MEDICARE | Primary: Family

## 2017-02-23 ENCOUNTER — Ambulatory Visit: Admit: 2017-02-23 | Discharge: 2017-02-23 | Attending: Family | Primary: Family

## 2017-02-23 DIAGNOSIS — E119 Type 2 diabetes mellitus without complications: Secondary | ICD-10-CM

## 2017-02-23 LAB — AMB POC HEMOGLOBIN A1C: Hemoglobin A1c (POC): 9.9 %

## 2017-02-23 MED ORDER — METFORMIN SR 500 MG 24 HR TABLET
500 mg | ORAL_TABLET | ORAL | 6 refills | Status: DC
Start: 2017-02-23 — End: 2018-04-02

## 2017-02-23 MED ORDER — LINAGLIPTIN 5 MG TAB
5 mg | ORAL_TABLET | Freq: Every day | ORAL | 3 refills | Status: DC
Start: 2017-02-23 — End: 2017-02-24

## 2017-02-23 MED ORDER — DICLOFENAC 75 MG TAB, DELAYED RELEASE
75 mg | ORAL_TABLET | Freq: Two times a day (BID) | ORAL | 1 refills | Status: DC | PRN
Start: 2017-02-23 — End: 2017-02-23

## 2017-02-23 MED ORDER — GLIMEPIRIDE 2 MG TAB
2 mg | ORAL_TABLET | ORAL | 6 refills | Status: DC
Start: 2017-02-23 — End: 2017-02-23

## 2017-02-23 MED ORDER — AMLODIPINE 10 MG TAB
10 mg | ORAL_TABLET | ORAL | 6 refills | Status: DC
Start: 2017-02-23 — End: 2017-12-26

## 2017-02-23 MED ORDER — METFORMIN SR 500 MG 24 HR TABLET
500 mg | ORAL_TABLET | ORAL | 6 refills | Status: DC
Start: 2017-02-23 — End: 2017-02-23

## 2017-02-23 MED ORDER — VALSARTAN-HYDROCHLOROTHIAZIDE 160 MG-25 MG TAB
160-25 mg | ORAL_TABLET | ORAL | 6 refills | Status: DC
Start: 2017-02-23 — End: 2017-02-23

## 2017-02-23 MED ORDER — VALSARTAN-HYDROCHLOROTHIAZIDE 160 MG-25 MG TAB
160-25 mg | ORAL_TABLET | ORAL | 6 refills | Status: DC
Start: 2017-02-23 — End: 2018-04-20

## 2017-02-23 MED ORDER — DICLOFENAC 75 MG TAB, DELAYED RELEASE
75 mg | ORAL_TABLET | ORAL | 1 refills | Status: DC
Start: 2017-02-23 — End: 2017-04-24

## 2017-02-23 MED ORDER — GLIMEPIRIDE 2 MG TAB
2 mg | ORAL_TABLET | ORAL | 6 refills | Status: DC
Start: 2017-02-23 — End: 2018-02-23

## 2017-02-23 MED ORDER — AMLODIPINE 10 MG TAB
10 mg | ORAL_TABLET | ORAL | 6 refills | Status: DC
Start: 2017-02-23 — End: 2017-02-23

## 2017-02-23 NOTE — Progress Notes (Signed)
Exam room 9  Lindsey Huynh is a 66 y.o. female  Chief Complaint   Patient presents with   ??? Diabetes     f/u   ??? Hypertension     f/u     1. Have you been to the ER, urgent care clinic since your last visit?  Hospitalized since your last visit?No    2. Have you seen or consulted any other health care providers outside of the [facility] since your last visit?  Include any pap smears or colon screening. No .  Health Maintenance Due   Topic Date Due   ??? Hepatitis C Screening  Sep 20, 1950   ??? EYE EXAM RETINAL OR DILATED Q1  02/01/1959   ??? DTaP/Tdap/Td series (1 - Tdap) 01/31/1970   ??? FOBT Q 1 YEAR AGE 73-75  02/01/1999   ??? ZOSTER VACCINE AGE 69>  12/01/2008   ??? GLAUCOMA SCREENING Q2Y  01/31/2014   ??? LIPID PANEL Q1  02/19/2015   ??? BREAST CANCER SCRN MAMMOGRAM  03/03/2016   ??? MEDICARE YEARLY EXAM  11/30/2016   ??? HEMOGLOBIN A1C Q6M  12/01/2016

## 2017-02-23 NOTE — Progress Notes (Signed)
HISTORY OF PRESENT ILLNESS  Lindsey Huynh is a 66 y.o. female with history of hypertension, hyperlipidemia, type 2 diabetes, cataract presents for routine visit  HPI   She is compliant with medical management. Believes she was on higher dose Metformin in past. Blood sugars above goal. No hyperglycemia symptoms  Eye exam current,  vision blurred Dr. 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 Did well on Diclofenac, requests refil    Colonoscopy and mammogram done in [place], 2017. Both normal        Past Medical History:   Diagnosis Date   ??? Diabetes ([place])    ??? GERD (gastroesophageal reflux disease)    ??? Hypercholesterolemia    ??? Hypertension        Current Outpatient Prescriptions on File Prior to Visit   Medication Sig Dispense Refill   ??? aspirin delayed-release 81 mg tablet Take 81 mg by mouth daily.     ??? omeprazole (PRILOSEC) 20 mg capsule Take 20 mg by mouth daily.       No current facility-administered medications on file prior to visit.                      Review of Systems   Constitutional: Positive for malaise/fatigue.   Eyes: Positive for blurred vision.   Respiratory: Negative.    Cardiovascular: Negative.    Gastrointestinal: Negative.    Genitourinary: Negative.  Negative for frequency.   Musculoskeletal: Positive for joint pain. Negative for falls.   Neurological: Negative.  Negative for headaches.   Psychiatric/Behavioral: Negative.        Physical Exam   Constitutional: She is oriented to person, place, and time. She appears well-developed and well-nourished. No distress.   HENT:   Right Ear: External ear normal.   Left Ear: External ear normal.   Nose: Nose normal.   Mouth/Throat: Oropharynx is clear and moist. No oropharyngeal exudate.   Eyes: Conjunctivae are normal. Right eye exhibits no discharge. Left eye exhibits no discharge.    Neck: Normal range of motion. Neck supple. No JVD present. Carotid bruit is not present.   Cardiovascular: Normal rate, regular rhythm and intact distal pulses.    Pulmonary/Chest: Effort normal and breath sounds normal.   Musculoskeletal: She exhibits no edema, tenderness or deformity.   Lymphadenopathy:     She has no cervical adenopathy.   Neurological: She is alert and oriented to person, place, and time. No cranial nerve deficit.   Skin: Skin is warm and dry. She is not diaphoretic.   Psychiatric: She has a normal mood and affect. Her behavior is normal.       ASSESSMENT and PLAN    ICD-10-CM ICD-9-CM    1. Type 2 diabetes mellitus without complication, unspecified whether long term insulin use (HCC) E11.9 250.00 AMB POC HEMOGLOBIN G6Y      METABOLIC PANEL, COMPREHENSIVE      AMB POC URINALYSIS DIP STICK AUTO W/O MICRO      CBC WITH AUTOMATED DIFF      DISCONTINUED: linagliptin (TRADJENTA) 5 mg tablet      DISCONTINUED: glimepiride (AMARYL) 2 mg tablet      DISCONTINUED: metFORMIN ER (GLUCOPHAGE XR) 500 mg tablet   2. Essential hypertension I94 854.6 METABOLIC PANEL, COMPREHENSIVE      DISCONTINUED: valsartan-hydroCHLOROthiazide (DIOVAN-HCT) 160-25 mg per tablet  DISCONTINUED: amLODIPine (NORVASC) 10 mg tablet   3. Hypercholesterolemia E78.00 272.0 LIPID PANEL      METABOLIC PANEL, COMPREHENSIVE   4. Need for hepatitis C screening test Z11.59 V73.89 HEPATITIS C AB   5. Controlled type 2 diabetes mellitus without complication, without long-term current use of insulin (HCC) E11.9 250.00 DISCONTINUED: metFORMIN ER (GLUCOPHAGE XR) 500 mg tablet   6. Chronic pain of left knee M25.562 719.46 DISCONTINUED: diclofenac EC (VOLTAREN) 75 mg EC tablet    G89.29 338.29      Follow-up Disposition:  Return in about 3 months (around 05/26/2017) for diabetes, htn.  lab results and schedule of future lab studies reviewed with patient  reviewed diet, exercise and weight control   cardiovascular risk and specific lipid/LDL goals reviewed  reviewed medications and side effects in detail  specific diabetic recommendations: low cholesterol diet, weight control and daily exercise discussed, home glucose monitoring emphasized, all medications, side effects and compliance discussed carefully, foot care discussed and Podiatry visits discussed, annual eye examinations at Ophthalmology discussed, glycohemoglobin and other lab monitoring discussed and long term diabetic complications discussed  use of aspirin to prevent MI and TIA's discussed    .1 Uncontrolled. Hemoglobin A1c 9.9% was 10.5%.. Add Januvia 50 mg daily. Continue other medications. Reinforced life sustyle management. Advised of blood sugar goal and when to call provider    2. Sub optimal control. Did not take amlodipine today. Encouraged blood pressure monitoring    3. Off statin. Strongly encouraged to restart    4. Encouraged to follow up with ortho    Patient voices understanding and acceptance of this advice and will call back if any further questions or concerns.    An After Visit Summary was printed and given to the patient.

## 2017-02-23 NOTE — Telephone Encounter (Signed)
Pt went to pick-up her Tradjenta 5 mg the pharmacy informed the pt that it would be over $400.00.Pt has not met her deductible and would like if NP Ivey could please send a different medication.pt's#  [phone] please call once new medication has been submitted.

## 2017-02-23 NOTE — Patient Instructions (Signed)
Learning About Diabetes Food Guidelines  Your Care Instructions    Meal planning is important to manage diabetes. It helps keep your blood sugar at a target level (which you set with your doctor). You don't have to eat special foods. You can eat what your family eats, including sweets once in a while. But you do have to pay attention to how often you eat and how much you eat of certain foods.  You may want to work with a dietitian or a certified diabetes educator (CDE) to help you plan meals and snacks. A dietitian or CDE can also help you lose weight if that is one of your goals.  What should you know about eating carbs?  Managing the amount of carbohydrate (carbs) you eat is an important part of healthy meals when you have diabetes. Carbohydrate is found in many foods.  ?? Learn which foods have carbs. And learn the amounts of carbs in different foods.  ?? Bread, cereal, pasta, and rice have about 15 grams of carbs in a serving. A serving is 1 slice of bread (1 ounce), ?? cup of cooked cereal, or 1/3 cup of cooked pasta or rice.  ?? Fruits have 15 grams of carbs in a serving. A serving is 1 small fresh fruit, such as an apple or orange; ?? of a banana; ?? cup of cooked or canned fruit; ?? cup of fruit juice; 1 cup of melon or raspberries; or 2 tablespoons of dried fruit.  ?? Milk and no-sugar-added yogurt have 15 grams of carbs in a serving. A serving is 1 cup of milk or 2/3 cup of no-sugar-added yogurt.  ?? Starchy vegetables have 15 grams of carbs in a serving. A serving is ?? cup of mashed potatoes or sweet potato; 1 cup winter squash; ?? of a small baked potato; ?? cup of cooked beans; or ?? cup cooked corn or green peas.  ?? Learn how much carbs to eat each day and at each meal. A dietitian or CDE can teach you how to keep track of the amount of carbs you eat. This is called carbohydrate counting.  ?? If you are not sure how to count carbohydrate grams, use the Plate  Method to plan meals. It is a good, quick way to make sure that you have a balanced meal. It also helps you spread carbs throughout the day.  ?? Divide your plate by types of foods. Put non-starchy vegetables on half the plate, meat or other protein food on one-quarter of the plate, and a grain or starchy vegetable in the final quarter of the plate. To this you can add a small piece of fruit and 1 cup of milk or yogurt, depending on how many carbs you are supposed to eat at a meal.  ?? Try to eat about the same amount of carbs at each meal. Do not "save up" your daily allowance of carbs to eat at one meal.  ?? Proteins have very little or no carbs per serving. Examples of proteins are beef, chicken, turkey, fish, eggs, tofu, cheese, cottage cheese, and peanut butter. A serving size of meat is 3 ounces, which is about the size of a deck of cards. Examples of meat substitute serving sizes (equal to 1 ounce of meat) are 1/4 cup of cottage cheese, 1 egg, 1 tablespoon of peanut butter, and ?? cup of tofu.  How can you eat out and still eat healthy?  ?? Learn to estimate the serving sizes of foods   that have carbohydrate. If you measure food at home, it will be easier to estimate the amount in a serving of restaurant food.  ?? If the meal you order has too much carbohydrate (such as potatoes, corn, or baked beans), ask to have a low-carbohydrate food instead. Ask for a salad or green vegetables.  ?? If you use insulin, check your blood sugar before and after eating out to help you plan how much to eat in the future.  ?? If you eat more carbohydrate at a meal than you had planned, take a walk or do other exercise. This will help lower your blood sugar.  What else should you know?  ?? Limit saturated fat, such as the fat from meat and dairy products. This is a healthy choice because people who have diabetes are at higher risk of heart disease. So choose lean cuts of meat and nonfat or low-fat dairy  products. Use olive or canola oil instead of butter or shortening when cooking.  ?? Don't skip meals. Your blood sugar may drop too low if you skip meals and take insulin or certain medicines for diabetes.  ?? Check with your doctor before you drink alcohol. Alcohol can cause your blood sugar to drop too low. Alcohol can also cause a bad reaction if you take certain diabetes medicines.  Follow-up care is a key part of your treatment and safety. Be sure to make and go to all appointments, and call your doctor if you are having problems. It's also a good idea to know your test results and keep a list of the medicines you take.  Where can you learn more?  Go to [employer].  Enter [phone] in the search box to learn more about "Learning About Diabetes Food Guidelines."  Current as of: November 30, 2015  Content Version: 11.4  ?? 2006-2017 [employer]. Care instructions adapted under license by [employer] (which disclaims liability or warranty for this information). If you have questions about a medical condition or this instruction, always ask your healthcare professional. Flora any warranty or liability for your use of this information.       Learning About High Blood Pressure  What is high blood pressure?    Blood pressure is a measure of how hard the blood pushes against the walls of your arteries. It's normal for blood pressure to go up and down throughout the day, but if it stays up, you have high blood pressure. Another name for high blood pressure is hypertension.  Two numbers tell you your blood pressure. The first number is the systolic pressure. It shows how hard the blood pushes when your heart is pumping. The second number is the diastolic pressure. It shows how hard the blood pushes between heartbeats, when your heart is relaxed and filling with blood.  A blood pressure of less than 120/80 (say "120 over 80") is ideal for an  adult. High blood pressure is 140/90 or higher. You have high blood pressure if your top number is 140 or higher or your bottom number is 90 or higher, or both. Many people fall into the category in between, called prehypertension. People with prehypertension need to make lifestyle changes to bring their blood pressure down and help prevent or delay high blood pressure.  What happens when you have high blood pressure?  ?? Blood flows through your arteries with too much force. Over time, this damages the walls of your arteries. But you can't feel it. High  blood pressure usually doesn't cause symptoms.  ?? Fat and calcium start to build up in your arteries. This buildup is called plaque. Plaque makes your arteries narrower and stiffer. Blood can't flow through them as easily.  ?? This lack of good blood flow starts to damage some of the organs in your body. This can lead to problems such as coronary artery disease and heart attack, heart failure, stroke, kidney failure, and eye damage.  How can you prevent high blood pressure?  ?? Stay at a healthy weight.  ?? Try to limit how much sodium you eat to less than 2,300 milligrams (mg) a day. If you limit your sodium to 1,500 mg a day, you can lower your blood pressure even more.  ?? Buy foods that are labeled "unsalted," "sodium-free," or "low-sodium." Foods labeled "reduced-sodium" and "light sodium" may still have too much sodium.  ?? Flavor your food with garlic, lemon juice, onion, vinegar, herbs, and spices instead of salt. Do not use soy sauce, steak sauce, onion salt, garlic salt, mustard, or ketchup on your food.  ?? Use less salt (or none) when recipes call for it. You can often use half the salt a recipe calls for without losing flavor.  ?? Be physically active. Get at least 30 minutes of exercise on most days of the week. Walking is a good choice. You also may want to do other activities, such as running, swimming, cycling, or playing tennis or team sports.   ?? Limit alcohol to 2 drinks a day for men and 1 drink a day for women.  ?? Eat plenty of fruits, vegetables, and low-fat dairy products. Eat less saturated and total fats.  How is high blood pressure treated?  ?? Your doctor will suggest making lifestyle changes. For example, your doctor may ask you to eat healthy foods, quit smoking, lose extra weight, and be more active.  ?? If lifestyle changes don't help enough or your blood pressure is very high, you will have to take medicine every day.  Follow-up care is a key part of your treatment and safety. Be sure to make and go to all appointments, and call your doctor if you are having problems. It's also a good idea to know your test results and keep a list of the medicines you take.  Where can you learn more?  Go to http://www.healthwise.net/[employer].  Enter P501 in the search box to learn more about "Learning About High Blood Pressure."  Current as of: June 10, 2015  Content Version: 11.4  ?? 2006-2017 [employer]. Care instructions adapted under license by [employer] (which disclaims liability or warranty for this information). If you have questions about a medical condition or this instruction, always ask your healthcare professional. [employer], [employer] disclaims any warranty or liability for your use of this information.

## 2017-02-24 LAB — CBC WITH AUTOMATED DIFF
ABS. BASOPHILS: 0 10*3/uL (ref 0.0–0.2)
ABS. EOSINOPHILS: 0.3 10*3/uL (ref 0.0–0.4)
ABS. IMM. GRANS.: 0 10*3/uL (ref 0.0–0.1)
ABS. MONOCYTES: 0.3 10*3/uL (ref 0.1–0.9)
ABS. NEUTROPHILS: 3.2 10*3/uL (ref 1.4–7.0)
Abs Lymphocytes: 1.7 10*3/uL (ref 0.7–3.1)
BASOPHILS: 0 %
EOSINOPHILS: 6 %
HCT: 36.8 % (ref 34.0–46.6)
HGB: 12.4 g/dL (ref 11.1–15.9)
IMMATURE GRANULOCYTES: 0 %
Lymphocytes: 30 %
MCH: 30.6 pg (ref 26.6–33.0)
MCHC: 33.7 g/dL (ref 31.5–35.7)
MCV: 91 fL (ref 79–97)
MONOCYTES: 6 %
NEUTROPHILS: 58 %
PLATELET: 327 10*3/uL (ref 150–379)
RBC: 4.05 x10E6/uL (ref 3.77–5.28)
RDW: 14.7 % (ref 12.3–15.4)
WBC: 5.6 10*3/uL (ref 3.4–10.8)

## 2017-02-24 LAB — LIPID PANEL
Cholesterol, total: 229 mg/dL — ABNORMAL HIGH (ref 100–199)
HDL Cholesterol: 61 mg/dL (ref >39)
LDL, calculated: 151 mg/dL — ABNORMAL HIGH (ref 0–99)
Triglyceride: 86 mg/dL (ref 0–149)
VLDL, calculated: 17 mg/dL (ref 5–40)

## 2017-02-24 LAB — METABOLIC PANEL, COMPREHENSIVE
A-G Ratio: 1.8 (ref 1.2–2.2)
ALT (SGPT): 15 IU/L (ref 0–32)
AST (SGOT): 10 IU/L (ref 0–40)
Albumin: 4.5 g/dL (ref 3.6–4.8)
Alk. phosphatase: 126 IU/L — ABNORMAL HIGH (ref 39–117)
BUN/Creatinine ratio: 14 (ref 12–28)
BUN: 11 mg/dL (ref 8–27)
Bilirubin, total: 0.3 mg/dL (ref 0.0–1.2)
CO2: 25 mmol/L (ref 18–29)
Calcium: 10.2 mg/dL (ref 8.7–10.3)
Chloride: 99 mmol/L (ref 96–106)
Creatinine: 0.79 mg/dL (ref 0.57–1.00)
GLOBULIN, TOTAL: 2.5 g/dL (ref 1.5–4.5)
Glucose: 279 mg/dL — ABNORMAL HIGH (ref 65–99)
Potassium: 4 mmol/L (ref 3.5–5.2)
Protein, total: 7 g/dL (ref 6.0–8.5)
Sodium: 138 mmol/L (ref 134–144)

## 2017-02-24 LAB — HEPATITIS C AB: Hep C Virus Ab: <0.1 s/co ratio (ref 0.0–0.9)

## 2017-02-24 LAB — HEPATITIS C ANTIBODY: Hepatitis C Ab: <0.1 {s_co_ratio} (ref 0.0–0.9)

## 2017-02-24 MED ORDER — SITAGLIPTIN 50 MG TAB
50 mg | ORAL_TABLET | Freq: Every day | ORAL | 3 refills | Status: DC
Start: 2017-02-24 — End: 2018-05-31

## 2017-04-22 ENCOUNTER — Encounter

## 2017-04-24 MED ORDER — DICLOFENAC 75 MG TAB, DELAYED RELEASE
75 mg | ORAL_TABLET | ORAL | 0 refills | Status: DC
Start: 2017-04-24 — End: 2018-02-23

## 2017-05-16 DIAGNOSIS — H25013 Cortical age-related cataract, bilateral: Secondary | ICD-10-CM | POA: Diagnosis not present

## 2017-05-16 DIAGNOSIS — H02839 Dermatochalasis of unspecified eye, unspecified eyelid: Secondary | ICD-10-CM | POA: Diagnosis not present

## 2017-05-16 DIAGNOSIS — H2513 Age-related nuclear cataract, bilateral: Secondary | ICD-10-CM | POA: Diagnosis not present

## 2017-05-16 DIAGNOSIS — H25043 Posterior subcapsular polar age-related cataract, bilateral: Secondary | ICD-10-CM | POA: Diagnosis not present

## 2017-06-23 DIAGNOSIS — H2512 Age-related nuclear cataract, left eye: Secondary | ICD-10-CM | POA: Diagnosis not present

## 2017-06-23 DIAGNOSIS — H2511 Age-related nuclear cataract, right eye: Secondary | ICD-10-CM | POA: Diagnosis not present

## 2017-07-17 ENCOUNTER — Ambulatory Visit: Payer: BLUE CROSS/BLUE SHIELD | Admitting: Nurse Practitioner

## 2017-07-26 ENCOUNTER — Encounter: Payer: Self-pay | Admitting: Obstetrics and Gynecology

## 2017-07-26 ENCOUNTER — Ambulatory Visit (INDEPENDENT_AMBULATORY_CARE_PROVIDER_SITE_OTHER): Payer: BLUE CROSS/BLUE SHIELD | Admitting: Obstetrics and Gynecology

## 2017-07-26 ENCOUNTER — Other Ambulatory Visit: Payer: Self-pay | Admitting: Obstetrics and Gynecology

## 2017-07-26 ENCOUNTER — Other Ambulatory Visit: Payer: Self-pay

## 2017-07-26 VITALS — BP 122/62 | HR 80 | Resp 14 | Ht 63.5 in | Wt 148.0 lb

## 2017-07-26 DIAGNOSIS — Z1231 Encounter for screening mammogram for malignant neoplasm of breast: Secondary | ICD-10-CM

## 2017-07-26 DIAGNOSIS — Z01419 Encounter for gynecological examination (general) (routine) without abnormal findings: Secondary | ICD-10-CM

## 2017-07-26 DIAGNOSIS — E2839 Other primary ovarian failure: Secondary | ICD-10-CM | POA: Diagnosis not present

## 2017-07-26 DIAGNOSIS — N952 Postmenopausal atrophic vaginitis: Secondary | ICD-10-CM

## 2017-07-26 DIAGNOSIS — M8588 Other specified disorders of bone density and structure, other site: Secondary | ICD-10-CM | POA: Diagnosis not present

## 2017-07-26 MED ORDER — ESTROGENS, CONJUGATED 0.625 MG/GM VA CREA: TOPICAL_CREAM | VAGINAL | 1 refills | Status: DC

## 2017-07-26 NOTE — Progress Notes (Signed)
66 y.o. G0P0000 MarriedCaucasianF here for annual exam.   No vaginal bleeding, not sexually active. She is very dry vaginally. She is using premarin cream 1/2 a gram 2 x a week. She has taken up Yoga in the last year. Not taking supplements for calcium, she is on vit D.  Still having hot flashes, tolerable.     Patient's last menstrual period was 09/04/2002 (exact date).          Sexually active: No.  The current method of family planning is post menopausal status.    Exercising: Yes.    The patient has a physically strenuous job, but has no regular exercise apart from work.  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 Frax calculation was 15.5% of any fracture and 0.7% of a hip fracture in the next 10 years.  TDaP:  02-23-10 Gardasil: N/A   reports that  has never smoked. she has never used smokeless tobacco. She reports that she does not drink alcohol or use drugs. Retired Catering manager (domestic). Husband is a Insurance underwriter for [employer], plans to retire in 5 months. They are buying a 5th wheel camper and are planing on traveling.   Past Medical History:  Diagnosis Date  . Allergy   . Anxiety   . Asthma   . DDD (degenerative disc disease)   . Depression   . GERD (gastroesophageal reflux disease)   . Hemorrhoids   . Hyperlipidemia   . Vitamin D deficiency     Past Surgical History:  Procedure Laterality Date  . 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 cataract surgery    . FOOT NEUROMA SURGERY     multiple times bilaterally  . PLANTAR FASCIA RELEASE      Current Outpatient Medications  Medication Sig Dispense Refill  . aspirin 81 MG tablet Take 81 mg by mouth daily.      Marland Kitchen conjugated estrogens (PREMARIN) vaginal cream Place vaginally 2 (two) times a week. Twice a week 42.5 g 4  . dexlansoprazole (DEXILANT) 60 MG capsule Take one tablet twice daily.  60 capsule 1  . DYMISTA 137-50 MCG/ACT SUSP Place 2 sprays into both nostrils as needed.    Marland Kitchen escitalopram (LEXAPRO) 10 MG tablet Take 10 mg by mouth daily.     Marland Kitchen levocetirizine (XYZAL) 5 MG tablet Take 1 tablet by mouth as needed.    . meloxicam (MOBIC) 15 MG tablet Take 1 tablet by mouth daily as needed.    . mometasone-formoterol (DULERA) 200-5 MCG/ACT AERO 2 puffs and rinse mouth twice daily 1 Inhaler 0  . Multiple Vitamins-Minerals (MULTIVITAMIN WITH MINERALS) tablet Take 1 tablet by mouth daily.      . ORACEA 40 MG capsule Take 1 tablet by mouth Daily.    Marland Kitchen PARoxetine (PAXIL) 40 MG tablet Take 1 tablet by mouth daily.    . pravastatin (PRAVACHOL) 40 MG tablet Take 1 tablet by mouth Daily.    Marland Kitchen SINGULAIR 10 MG tablet Take 1 tablet by mouth Daily.    . Vitamin D, Ergocalciferol, (DRISDOL) 50000 UNITS CAPS Take 1 tablet by mouth Once a week.     No current facility-administered medications for this visit.     Family History  Problem Relation Age of Onset  . Heart disease Mother   . Diabetes Mother   .  Tuberculosis Paternal Grandfather   . Bone cancer Paternal Grandfather   . Breast cancer Sister 65  . Melanoma Sister   . Cirrhosis Maternal Grandfather   . Heart failure Paternal Grandmother   . Colon cancer Neg Hx     Review of Systems  Constitutional: Negative.   HENT: Negative.   Eyes: Negative.   Respiratory: Negative.   Cardiovascular: Negative.   Gastrointestinal: Negative.   Endocrine: Negative.   Genitourinary:       Night urination  Loss of urine spontaneously    Musculoskeletal: Negative.   Skin: Negative.   Allergic/Immunologic: Negative.   Neurological: Negative.   Psychiatric/Behavioral: Negative.     Exam:   BP 122/62 (BP Location: Right Arm, Patient Position: Sitting, Cuff Size: Normal)   Pulse 80   Resp 14   Ht 5' 3.5" (1.613 m)   Wt 148 lb (67.1 kg)   LMP 09/04/2002 (Exact Date)   BMI 25.81 kg/m   Weight change: @WEIGHTCHANGE @ Height:    Height: 5' 3.5" (161.3 cm)  Ht Readings from Last 3 Encounters:  07/26/17 5' 3.5" (1.613 m)  07/13/16 5' 3.5" (1.613 m)  07/08/15 5' 3.75" (1.619 m)    General appearance: alert, cooperative and appears stated age Head: Normocephalic, without obvious abnormality, atraumatic Neck: no adenopathy, supple, symmetrical, trachea midline and thyroid normal to inspection and palpation Lungs: clear to auscultation bilaterally Cardiovascular: regular rate and rhythm Breasts: normal appearance, no masses or tenderness Abdomen: soft, non-tender; non distended,  no masses,  no organomegaly Extremities: extremities normal, atraumatic, no cyanosis or edema Skin: Skin color, texture, turgor normal. No rashes or lesions Lymph nodes: Cervical, supraclavicular, and axillary nodes normal. No abnormal inguinal nodes palpated Neurologic: Grossly normal   Pelvic: External genitalia:  no lesions              Urethra:  normal appearing urethra with no masses, tenderness or lesions              Bartholins and Skenes: normal                 Vagina: normal appearing vagina with mild atrophy              Cervix: no lesions               Bimanual Exam:  Uterus:  normal size, contour, position, consistency, mobility, non-tender              Adnexa: no mass, fullness, tenderness               Rectovaginal: Confirms               Anus:  normal sphincter tone, no lesions  Chaperone was present for exam.  A:  Well Woman with normal exam  Vaginal atrophy, helped with the premarin cream  P:   No pap this year  Mammogram due, she will schedule  DEXA ordered  Discussed breast self exam  Discussed calcium and vit D intake

## 2017-07-26 NOTE — Patient Instructions (Signed)

## 2017-08-31 ENCOUNTER — Ambulatory Visit: Payer: BLUE CROSS/BLUE SHIELD

## 2017-08-31 ENCOUNTER — Ambulatory Visit
Admission: RE | Admit: 2017-08-31 | Discharge: 2017-08-31 | Disposition: A | Discharge status: Home / Self Care | Payer: BLUE CROSS/BLUE SHIELD | Source: Ambulatory Visit | Attending: Obstetrics and Gynecology | Admitting: Obstetrics and Gynecology

## 2017-08-31 DIAGNOSIS — Z1231 Encounter for screening mammogram for malignant neoplasm of breast: Secondary | ICD-10-CM

## 2017-08-31 DIAGNOSIS — M8588 Other specified disorders of bone density and structure, other site: Secondary | ICD-10-CM

## 2017-08-31 DIAGNOSIS — E2839 Other primary ovarian failure: Secondary | ICD-10-CM

## 2017-10-27 ENCOUNTER — Encounter: Payer: Self-pay | Admitting: Gastroenterology

## 2017-10-27 ENCOUNTER — Ambulatory Visit: Payer: Commercial Managed Care - PPO | Admitting: Gastroenterology

## 2017-10-27 ENCOUNTER — Encounter (INDEPENDENT_AMBULATORY_CARE_PROVIDER_SITE_OTHER): Payer: Self-pay

## 2017-10-27 VITALS — BP 112/58 | HR 64 | Ht 64.0 in | Wt 146.0 lb

## 2017-10-27 DIAGNOSIS — K5900 Constipation, unspecified: Secondary | ICD-10-CM

## 2017-10-27 DIAGNOSIS — K641 Second degree hemorrhoids: Secondary | ICD-10-CM

## 2017-10-27 DIAGNOSIS — R195 Other fecal abnormalities: Secondary | ICD-10-CM | POA: Diagnosis not present

## 2017-10-27 MED ORDER — NA SULFATE-K SULFATE-MG SULF 17.5-3.13-1.6 GM/177ML PO SOLN: ORAL | 0 refills | Status: DC

## 2017-10-27 NOTE — Progress Notes (Signed)
Alyssa Elliott    [id number]    June 10, 1951  Primary Care Physician:Avva, Steva Ready, MD  Referring Physician: Prince Solian, MD [address] [place], [place] [address]  Chief complaint:  Hemorrhoids, Heme positive stool  HPI: 67 year old female previously followed by Dr. Olevia Perches is here with complaints of symptomatic hemorrhoids and also recently had heme positive stool on fecal Hemoccult testing.  Denies any overt GI bleeding, no melena or bright red blood per rectum.  She has had thrombosed external hemorrhoids in the past that were lanced and removed by surgery.  She occasionally feels hemorrhoid protrude out with sensation of fullness in the rectum.  She is taking fiber gummy's with regular bowel movement once a day.  Denies excessive straining.  Denies any dysphagia, odynophagia, abdominal pain, nausea or vomiting.  Acid reflux symptoms well controlled on Dexilant daily.  Denies any breakthrough heartburn.  Last colonoscopy in 2012 and endoscopy in 2013.   Outpatient Encounter Medications as of 10/27/2017  Medication Sig  . aspirin 81 MG tablet Take 81 mg by mouth daily.    Marland Kitchen conjugated estrogens (PREMARIN) vaginal cream Place 0.5 grams intravaginally 2 x a week at bedtime  . dexlansoprazole (DEXILANT) 60 MG capsule Take one tablet twice daily.  Marland Kitchen DYMISTA 137-50 MCG/ACT SUSP Place 2 sprays into both nostrils as needed.  Marland Kitchen escitalopram (LEXAPRO) 10 MG tablet Take 10 mg by mouth daily.   Marland Kitchen levocetirizine (XYZAL) 5 MG tablet Take 1 tablet by mouth as needed.  . meloxicam (MOBIC) 15 MG tablet Take 1 tablet by mouth daily as needed.  . mometasone-formoterol (DULERA) 200-5 MCG/ACT AERO 2 puffs and rinse mouth twice daily  . Multiple Vitamins-Minerals (MULTIVITAMIN WITH MINERALS) tablet Take 1 tablet by mouth daily.    . ORACEA 40 MG capsule Take 1 tablet by mouth Daily.  Marland Kitchen PARoxetine (PAXIL) 40 MG tablet Take 1 tablet by mouth daily.  . pravastatin (PRAVACHOL) 40 MG  tablet Take 1 tablet by mouth Daily.  Marland Kitchen SINGULAIR 10 MG tablet Take 1 tablet by mouth Daily.  . Vitamin D, Ergocalciferol, (DRISDOL) 50000 UNITS CAPS Take 1 tablet by mouth Once a week.   No facility-administered encounter medications on file as of 10/27/2017.     Allergies as of 10/27/2017 - Review Complete 07/26/2017  Allergen Reaction Noted  . Codeine Nausea And Vomiting 04/02/2013  . Penicillins Swelling 06/14/2011  . Latex Rash 04/02/2013  . Sulfa antibiotics Rash 04/02/2013    Past Medical History:  Diagnosis Date  . Allergy   . Anxiety   . Asthma   . DDD (degenerative disc disease)   . Depression   . GERD (gastroesophageal reflux disease)   . Hemorrhoids   . Hyperlipidemia   . Vitamin D deficiency     Past Surgical History:  Procedure Laterality Date  . 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 cataract surgery    . FOOT NEUROMA SURGERY     multiple times bilaterally  . PLANTAR FASCIA RELEASE      Family History  Problem Relation Age of Onset  . Heart disease Mother   . Diabetes Mother   . Tuberculosis Paternal Grandfather   . Bone cancer Paternal Grandfather   . Breast cancer Sister 81  . Melanoma Sister   . Cirrhosis Maternal Grandfather   . Heart failure Paternal Grandmother   . Colon cancer Neg Hx     Social History   Socioeconomic History  .  Marital status: Married    Spouse name: Not on file  . Number of children: 0  . Years of education: Not on file  . Highest education level: Not on file  Social Needs  . Financial resource strain: Not on file  . Food insecurity - worry: Not on file  . Food insecurity - inability: Not on file  . Transportation needs - medical: Not on file  . Transportation needs - non-medical: Not on file  Occupational History  . Occupation: retired Catering manager  Tobacco Use  . Smoking status: Never Smoker  . Smokeless tobacco: Never Used  Substance and Sexual Activity  . Alcohol use: No  . Drug use: No  . Sexual  activity: No    Partners: Male    Birth control/protection: Post-menopausal  Other Topics Concern  . Not on file  Social History Narrative  . Not on file      Review of systems: Review of Systems  Constitutional: Negative for fever and chills.  HENT: Positive for sinus problems   Eyes: Negative for blurred vision.  Respiratory: Negative for cough, shortness of breath and wheezing.   Cardiovascular: Negative for chest pain and palpitations.  Gastrointestinal: as per HPI Genitourinary: Negative for dysuria, urgency, frequency and hematuria.  Musculoskeletal: Negative for myalgias, back pain and joint pain.  Skin: Negative for itching and rash.  Neurological: Negative for dizziness, tremors, focal weakness, seizures and loss of consciousness.  Endo/Heme/Allergies: Positive for seasonal allergies.  Psychiatric/Behavioral: Negative for depression, suicidal ideas and hallucinations.  All other systems reviewed and are negative.   Physical Exam: Vitals:   10/27/17 1103  BP: (!) 112/58  Pulse: 64   Body mass index is 25.06 kg/m. Gen:      No acute distress HEENT:  EOMI, sclera anicteric Neck:     No masses; no thyromegaly Lungs:    Clear to auscultation bilaterally; normal respiratory effort CV:         Regular rate and rhythm; no murmurs Abd:      + bowel sounds; soft, non-tender; no palpable masses, no distension Ext:    No edema; adequate peripheral perfusion Skin:      Warm and dry; no rash Neuro: alert and oriented x 3 Psych: normal mood and affect Rectal exam: Normal anal sphincter tone, no anal fissure or external hemorrhoids Anoscopy: medium size grade 2 internal hemorrhoids in right anterior and right posterior, no active bleeding, normal dentate line, no visible nodules  Data Reviewed:  Reviewed labs, radiology imaging, old records and pertinent past GI work up  Upper endoscopy March 2013: 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 Denies any specific upper GI symptoms We will plan to do colonoscopy to evaluate The risks and benefits as well as alternatives of endoscopic procedure(s) have been discussed and reviewed. All questions answered. The patient agrees to proceed.  Chronic constipation: Increase dietary fiber and fluid intake Continue fiber supplements Currently is having regular bowel movements, will hold off starting laxatives  Symptomatic hemorrhoids: Scheduled for internal hemorrhoidal band ligation after colonoscopy   25 minutes was spent face-to-face with the patient. Greater than 50% of the time used for counseling as well as treatment plan and  follow-up. She had multiple questions which were answered to her satisfaction  K. Denzil Magnuson , MD [phone] Mon-Fri 8a-5p [phone] after 5p, weekends, holidays  CC: Avva, Ravisankar, MD

## 2017-10-27 NOTE — Patient Instructions (Addendum)
If you are age 69 or older, your body mass index should be between 23-30. Your Body mass index is 25.06 kg/m. If this is out of the aforementioned range listed, please consider follow up with your Primary Care Provider.  If you are age 37 or younger, your body mass index should be between 19-25. Your Body mass index is 25.06 kg/m. If this is out of the aformentioned range listed, please consider follow up with your Primary Care Provider.   You have been scheduled for a colonoscopy. Please follow written instructions given to you at your visit today.  Please pick up your prep supplies at the pharmacy within the next 1-3 days. If you use inhalers (even only as needed), please bring them with you on the day of your procedure. Your physician has requested that you go to www.startemmi.com and enter the access code given to you at your visit today. This web site gives a general overview about your procedure. However, you should still follow specific instructions given to you by our office regarding your preparation for the procedure. You may have a light breakfast the morning of prep day (the day before the procedure).   You may choose from one of the following items: eggs and toast OR chicken noodle soup and crackers.   You should have your breakfast completed between 8:00 and 9:00 am the day before your procedure.    After you have had your light breakfast you should start a clear liquid diet only, NO SOLIDS. No additional solid food is allowed. You may continue to have clear liquid up to 3 hours prior to your procedure.   We have sent the following medications to your pharmacy for you to pick up at your convenience: Suprep  Continue Fiber gummies. Increase fluid to 10-12 cups per day.  Follow up appointment on 12/18/17 at 3:15 for hemorrhoid banding.  Thank you for choosing me and [facility].   Pat Kocher, MD

## 2017-11-07 ENCOUNTER — Encounter: Payer: Self-pay | Admitting: Gastroenterology

## 2017-11-07 ENCOUNTER — Other Ambulatory Visit: Payer: Self-pay

## 2017-11-07 ENCOUNTER — Ambulatory Visit (AMBULATORY_SURGERY_CENTER): Payer: Commercial Managed Care - PPO | Admitting: Gastroenterology

## 2017-11-07 VITALS — BP 109/58 | HR 73 | Temp 98.4°F | Resp 13 | Ht 64.0 in | Wt 146.0 lb

## 2017-11-07 DIAGNOSIS — R195 Other fecal abnormalities: Secondary | ICD-10-CM

## 2017-11-07 DIAGNOSIS — D123 Benign neoplasm of transverse colon: Secondary | ICD-10-CM

## 2017-11-07 MED ORDER — SODIUM CHLORIDE 0.9 % IV SOLN: 500.0000 mL | Freq: Once | INTRAVENOUS | Status: DC

## 2017-11-07 NOTE — Op Note (Signed)
[facility] Patient Name: Alyssa Elliott Procedure Date: 11/07/2017 3:53 PM MRN: [id number] Endoscopist: Mauri Pole , MD Age: 67 Referring MD:  Date of Birth: Aug 18, 1951 Gender: Female Account #: [id number] Procedure:                Colonoscopy Indications:              Evaluation of unexplained GI bleeding Medicines:                Monitored Anesthesia Care Procedure:                Pre-Anesthesia Assessment:                           - Prior to the procedure, a History and Physical                            was performed, and patient medications and                            allergies were reviewed. The patient's tolerance of                            previous anesthesia was also reviewed. The risks                            and benefits of the procedure and the sedation                            options and risks were discussed with the patient.                            All questions were answered, and informed consent                            was obtained. Prior Anticoagulants: The patient has                            taken no previous anticoagulant or antiplatelet                            agents. ASA Grade Assessment: II - A patient with                            mild systemic disease. After reviewing the risks                            and benefits, the patient was deemed in                            satisfactory condition to undergo the procedure.                           After obtaining informed consent, the colonoscope  was passed under direct vision. Throughout the                            procedure, the patient's blood pressure, pulse, and                            oxygen saturations were monitored continuously. The                            Model PCF-[id number]) scope was introduced                            through the anus and advanced to the the cecum,                            identified by  appendiceal orifice and ileocecal                            valve. The colonoscopy was performed without                            difficulty. The patient tolerated the procedure                            well. The quality of the bowel preparation was                            excellent. The ileocecal valve, appendiceal                            orifice, and rectum were photographed. Scope In: 4:02:42 PM Scope Out: 4:18:20 PM Scope Withdrawal Time: 0 hours 9 minutes 58 seconds  Total Procedure Duration: 0 hours 15 minutes 38 seconds  Findings:                 The perianal and digital rectal examinations were                            normal.                           A 5 mm polyp was found in the transverse colon. The                            polyp was sessile. The polyp was removed with a                            cold snare. Resection and retrieval were complete.                           Non-bleeding internal hemorrhoids were found during                            retroflexion. The hemorrhoids were large.  A few small-mouthed diverticula were found in the                            sigmoid colon and descending colon. Complications:            No immediate complications. Estimated Blood Loss:     Estimated blood loss was minimal. Impression:               - One 5 mm polyp in the transverse colon, removed                            with a cold snare. Resected and retrieved.                           - Non-bleeding internal hemorrhoids.                           - Diverticulosis in the sigmoid colon and in the                            descending colon. Recommendation:           - Patient has a contact number available for                            emergencies. The signs and symptoms of potential                            delayed complications were discussed with the                            patient. Return to normal activities tomorrow.                             Written discharge instructions were provided to the                            patient.                           - Resume previous diet.                           - Continue present medications.                           - Await pathology results.                           - Repeat colonoscopy in 5-10 years for surveillance                            based on pathology results.                           - Return to GI clinic as previously scheduled. Mauri Pole, MD 11/07/2017 4:26:59 PM This report  has been signed electronically.

## 2017-11-07 NOTE — Progress Notes (Signed)
Spontaneous respirations throughout. VSS. Resting comfortably. To PACU on room air. Report to  RN. 

## 2017-11-07 NOTE — Progress Notes (Signed)
Called to room to assist during endoscopic procedure.  Patient ID and intended procedure confirmed with present staff. Received instructions for my participation in the procedure from the performing physician.  

## 2017-11-07 NOTE — Patient Instructions (Signed)
YOU HAD AN ENDOSCOPIC PROCEDURE TODAY AT [facility]:   Refer to the procedure report that was given to you for any specific questions about what was found during the examination.  If the procedure report does not answer your questions, please call your gastroenterologist to clarify.  If you requested that your care partner not be given the details of your procedure findings, then the procedure report has been included in a sealed envelope for you to review at your convenience later.  YOU SHOULD EXPECT: Some feelings of bloating in the abdomen. Passage of more gas than usual.  Walking can help get rid of the air that was put into your GI tract during the procedure and reduce the bloating. If you had a lower endoscopy (such as a colonoscopy or flexible sigmoidoscopy) you may notice spotting of blood in your stool or on the toilet paper. If you underwent a bowel prep for your procedure, you may not have a normal bowel movement for a few days.  Please Note:  You might notice some irritation and congestion in your nose or some drainage.  This is from the oxygen used during your procedure.  There is no need for concern and it should clear up in a day or so.  SYMPTOMS TO REPORT IMMEDIATELY:   Following lower endoscopy (colonoscopy or flexible sigmoidoscopy):  Excessive amounts of blood in the stool  Significant tenderness or worsening of abdominal pains  Swelling of the abdomen that is new, acute  Fever of 100F or higher  For urgent or emergent issues, a gastroenterologist can be reached at any hour by calling [phone].   DIET:  We do recommend a small meal at first, but then you may proceed to your regular diet.  Drink plenty of fluids but you should avoid alcoholic beverages for 24 hours.  ACTIVITY:  You should plan to take it easy for the rest of today and you should NOT DRIVE or use heavy machinery until tomorrow (because of the sedation medicines used during the test).     FOLLOW UP: Our staff will call the number listed on your records the next business day following your procedure to check on you and address any questions or concerns that you may have regarding the information given to you following your procedure. If we do not reach you, we will leave a message.  However, if you are feeling well and you are not experiencing any problems, there is no need to return our call.  We will assume that you have returned to your regular daily activities without incident.  If any biopsies were taken you will be contacted by phone or by letter within the next 1-3 weeks.  Please call us at [phone] if you have not heard about the biopsies in 3 weeks.    SIGNATURES/CONFIDENTIALITY: You and/or your care partner have signed paperwork which will be entered into your electronic medical record.  These signatures attest to the fact that that the information above on your After Visit Summary has been reviewed and is understood.  Full responsibility of the confidentiality of this discharge information lies with you and/or your care-partner.   Resume medications. Information given on polyps, diverticulosis and hemorrhoids.

## 2017-11-08 ENCOUNTER — Telehealth: Payer: Self-pay

## 2017-11-08 NOTE — Telephone Encounter (Signed)
Left message on answering machine. 

## 2017-11-08 NOTE — Telephone Encounter (Signed)
  Follow up Call-  Call back number 11/07/2017  Post procedure Call Back phone  # [phone]  Permission to leave phone message Yes  Some recent data might be hidden     Left message

## 2017-11-14 ENCOUNTER — Encounter: Payer: Self-pay | Admitting: Gastroenterology

## 2017-12-18 ENCOUNTER — Ambulatory Visit: Payer: Commercial Managed Care - PPO | Admitting: Gastroenterology

## 2017-12-18 ENCOUNTER — Encounter: Payer: Self-pay | Admitting: Gastroenterology

## 2017-12-18 VITALS — BP 118/62 | HR 78 | Ht 64.0 in | Wt 148.4 lb

## 2017-12-18 DIAGNOSIS — K641 Second degree hemorrhoids: Secondary | ICD-10-CM

## 2017-12-18 NOTE — Progress Notes (Addendum)
PROCEDURE NOTE: The patient presents with symptomatic grade II  hemorrhoids, requesting rubber band ligation of his/her hemorrhoidal disease.  All risks, benefits and alternative forms of therapy were described and informed consent was obtained.  In the Left Lateral Decubitus position anoscopic examination revealed grade II hemorrhoids in the Right posterior, left lateral and right anterior position(s).  The anorectum was pre-medicated with 0.125% Nitroglycerine and Recticare The decision was made to band the Right posterior internal hemorrhoid, and the [place] was used to perform band ligation without complication.  Digital anorectal examination was then performed to assure proper positioning of the band, and to adjust the banded tissue as required.  The patient was discharged home without pain or other issues.  Dietary and behavioral recommendations were given and along with follow-up instructions.     The following adjunctive treatments were recommended:  Fiber supplements Miralax 1 capful daily at bedtime and titrate as needed based on response  The patient will return in 2-4 weeks for  follow-up and possible additional banding as required. No complications were encountered and the patient tolerated the procedure well.  Damaris Hippo , MD [phone]

## 2017-12-18 NOTE — Progress Notes (Signed)
n

## 2017-12-18 NOTE — Patient Instructions (Addendum)
HEMORRHOID BANDING PROCEDURE    FOLLOW-UP CARE   1. The procedure you have had should have been relatively painless since the banding of the area involved does not have nerve endings and there is no pain sensation.  The rubber band cuts off the blood supply to the hemorrhoid and the band may fall off as soon as 48 hours after the banding (the band may occasionally be seen in the toilet bowl following a bowel movement). You may notice a temporary feeling of fullness in the rectum which should respond adequately to plain Tylenol or Motrin.  2. Following the banding, avoid strenuous exercise that evening and resume full activity the next day.  A sitz bath (soaking in a warm tub) or bidet is soothing, and can be useful for cleansing the area after bowel movements.     3. To avoid constipation, take two tablespoons of natural wheat bran, natural oat bran, flax, Benefiber or any over the counter fiber supplement and increase your water intake to 7-8 glasses daily.    4. Unless you have been prescribed anorectal medication, do not put anything inside your rectum for two weeks: No suppositories, enemas, fingers, etc.  5. Occasionally, you may have more bleeding than usual after the banding procedure.  This is often from the untreated hemorrhoids rather than the treated one.  Don't be concerned if there is a tablespoon or so of blood.  If there is more blood than this, lie flat with your bottom higher than your head and apply an ice pack to the area. If the bleeding does not stop within a half an hour or if you feel faint, call our office at [phone] or go to the emergency room.  6. Problems are not common; however, if there is a substantial amount of bleeding, severe pain, chills, fever or difficulty passing urine (very rare) or other problems, you should call us at [phone] or report to the nearest emergency room.  7. Do not stay seated continuously for more than 2-3 hours for a day or two  after the procedure.  Tighten your buttock muscles 10-15 times every two hours and take 10-15 deep breaths every 1-2 hours.  Do not spend more than a few minutes on the toilet if you cannot empty your bowel; instead re-visit the toilet at a later time.     

## 2017-12-25 ENCOUNTER — Encounter

## 2017-12-26 MED ORDER — AMLODIPINE 10 MG TAB
10 mg | ORAL_TABLET | ORAL | 0 refills | Status: DC
Start: 2017-12-26 — End: 2018-04-20

## 2018-01-08 ENCOUNTER — Ambulatory Visit: Payer: Commercial Managed Care - PPO | Admitting: Gastroenterology

## 2018-01-08 ENCOUNTER — Encounter: Payer: Self-pay | Admitting: Gastroenterology

## 2018-01-08 VITALS — BP 126/68 | HR 72 | Ht 64.0 in | Wt 148.1 lb

## 2018-01-08 DIAGNOSIS — K641 Second degree hemorrhoids: Secondary | ICD-10-CM | POA: Diagnosis not present

## 2018-01-08 NOTE — Progress Notes (Signed)
PROCEDURE NOTE: The patient presents with symptomatic grade II-III  hemorrhoids, requesting rubber band ligation of his/her hemorrhoidal disease.  All risks, benefits and alternative forms of therapy were described and informed consent was obtained.   The anorectum was pre-medicated with 0.125% Nitroglycerine and Recticare The decision was made to band the Left lateral internal hemorrhoid, and the [place] was used to perform band ligation without complication.  Digital anorectal examination was then performed to assure proper positioning of the band, and to adjust the banded tissue as required.  The patient was discharged home without pain or other issues.  Dietary and behavioral recommendations were given and along with follow-up instructions.     The following adjunctive treatments were recommended: Benefiber 1 tablespoon TID with meals  The patient will return in 2-4 weeks for  follow-up and possible additional banding as required. No complications were encountered and the patient tolerated the procedure well.  Damaris Hippo , MD [phone]

## 2018-01-08 NOTE — Patient Instructions (Signed)
HEMORRHOID BANDING PROCEDURE    FOLLOW-UP CARE   1. The procedure you have had should have been relatively painless since the banding of the area involved does not have nerve endings and there is no pain sensation.  The rubber band cuts off the blood supply to the hemorrhoid and the band may fall off as soon as 48 hours after the banding (the band may occasionally be seen in the toilet bowl following a bowel movement). You may notice a temporary feeling of fullness in the rectum which should respond adequately to plain Tylenol or Motrin.  2. Following the banding, avoid strenuous exercise that evening and resume full activity the next day.  A sitz bath (soaking in a warm tub) or bidet is soothing, and can be useful for cleansing the area after bowel movements.     3. To avoid constipation, take two tablespoons of natural wheat bran, natural oat bran, flax, Benefiber or any over the counter fiber supplement and increase your water intake to 7-8 glasses daily.    4. Unless you have been prescribed anorectal medication, do not put anything inside your rectum for two weeks: No suppositories, enemas, fingers, etc.  5. Occasionally, you may have more bleeding than usual after the banding procedure.  This is often from the untreated hemorrhoids rather than the treated one.  Don't be concerned if there is a tablespoon or so of blood.  If there is more blood than this, lie flat with your bottom higher than your head and apply an ice pack to the area. If the bleeding does not stop within a half an hour or if you feel faint, call our office at [phone] or go to the emergency room.  6. Problems are not common; however, if there is a substantial amount of bleeding, severe pain, chills, fever or difficulty passing urine (very rare) or other problems, you should call us at [phone] or report to the nearest emergency room.  7. Do not stay seated continuously for more than 2-3 hours for a day or two  after the procedure.  Tighten your buttock muscles 10-15 times every two hours and take 10-15 deep breaths every 1-2 hours.  Do not spend more than a few minutes on the toilet if you cannot empty your bowel; instead re-visit the toilet at a later time.     

## 2018-01-23 ENCOUNTER — Encounter: Attending: Family | Primary: Family

## 2018-01-30 ENCOUNTER — Encounter: Payer: Commercial Managed Care - PPO | Admitting: Gastroenterology

## 2018-02-20 ENCOUNTER — Other Ambulatory Visit: Payer: Self-pay | Admitting: *Deleted

## 2018-02-20 ENCOUNTER — Telehealth: Payer: Self-pay | Admitting: Obstetrics and Gynecology

## 2018-02-20 MED ORDER — ESTROGENS, CONJUGATED 0.625 MG/GM VA CREA: TOPICAL_CREAM | VAGINAL | 0 refills | Status: DC

## 2018-02-20 NOTE — Telephone Encounter (Signed)
I've sent in a refill for her premarin cream, but one 30 gram tube should theoretically last 30 weeks (I know some is lost with use). Please make sure she is only using 1/2 gram 2 x a week. Thanks

## 2018-02-20 NOTE — Telephone Encounter (Signed)
Medication refill request: premarin vaginal cream  Last AEX:  07/26/17 JJ  Next AEX: 08/01/18  Last MMG (if hormonal medication request): 08/31/17 BIRADS 1 negative  Refill authorized: 07/26/17 #42.5g, 1RF. Today, please advise.

## 2018-02-21 DIAGNOSIS — J3089 Other allergic rhinitis: Secondary | ICD-10-CM | POA: Diagnosis not present

## 2018-02-21 DIAGNOSIS — J301 Allergic rhinitis due to pollen: Secondary | ICD-10-CM | POA: Diagnosis not present

## 2018-02-22 NOTE — Telephone Encounter (Signed)
Message left to return call to Marck Mcclenny at [phone].    

## 2018-02-23 ENCOUNTER — Ambulatory Visit: Admit: 2018-02-23 | Discharge: 2018-02-23 | Payer: MEDICARE | Attending: Family | Primary: Family

## 2018-02-23 ENCOUNTER — Inpatient Hospital Stay: Admit: 2018-05-03 | Payer: MEDICARE | Primary: Family

## 2018-02-23 ENCOUNTER — Ambulatory Visit: Attending: Family | Primary: Family

## 2018-02-23 DIAGNOSIS — H269 Unspecified cataract: Secondary | ICD-10-CM

## 2018-02-23 DIAGNOSIS — E7800 Pure hypercholesterolemia, unspecified: Secondary | ICD-10-CM

## 2018-02-23 LAB — AMB POC URINALYSIS DIP STICK AUTO W/O MICRO
Bilirubin (UA POC): NEGATIVE
Bilirubin, Urine, POC: NEGATIVE
Blood (UA POC): NEGATIVE
Blood (UA POC): NEGATIVE
Ketones (UA POC): NEGATIVE
Ketones, Urine, POC: NEGATIVE
Leukocyte Esterase, Urine, POC: NEGATIVE
Leukocyte esterase (UA POC): NEGATIVE
Nitrite, Urine, POC: POSITIVE
Nitrites (UA POC): POSITIVE
Protein (UA POC): NEGATIVE
Protein, Urine, POC: NEGATIVE
Specific Gravity, Urine, POC: 1.01 NA (ref 1.001–1.035)
Specific gravity (UA POC): 1.01 (ref 1.001–1.035)
Urobilinogen (UA POC): 0.2 (ref 0.2–1)
Urobilinogen, POC: 0.2 (ref 0.2–1)
pH (UA POC): 5 (ref 4.6–8.0)
pH, Urine, POC: 5 NA (ref 4.6–8.0)

## 2018-02-23 LAB — AMB POC HEMOGLOBIN A1C
Hemoglobin A1C, POC: 11 %
Hemoglobin A1c (POC): 11 %

## 2018-02-23 LAB — AMB POC URINE, MICROALBUMIN, SEMIQUANT (3 RESULTS)
ALBUMIN, URINE POC: 80 mg/L
Albumin, Urine POC: 80 mg/L
CREATININE, URINE POC: 200 mg/dL
Creatinine, Urine, POC: 200 mg/dL

## 2018-02-23 LAB — AMB POC GLUCOSE BLOOD, BY GLUCOSE MONITORING DEVICE
Glucose POC: 266 mg/dL — AB (ref 70–110)
Glucose, POC: 266 mg/dL — AB (ref 70–110)

## 2018-02-23 MED ORDER — PEN NEEDLE, DIABETIC 32 GAUGE X 5/32"
32 gauge x 5/" | PEN_INJECTOR | 3 refills | Status: DC
Start: 2018-02-23 — End: 2019-10-14

## 2018-02-23 MED ORDER — INSULIN DETEMIR 100 UNIT/ML (3 ML) SUB-Q PEN
100 unit/mL (3 mL) | Freq: Every day | SUBCUTANEOUS | 3 refills | Status: DC
Start: 2018-02-23 — End: 2018-10-30

## 2018-02-23 NOTE — Telephone Encounter (Signed)
Patient returning Emily's call. °

## 2018-02-23 NOTE — Telephone Encounter (Signed)
Returned call to patient. Patient states that she is using the premarin cream as prescribed, 1/2 gram 2 x a week. Patient states she still has half of a tube left, but that [facility] had sent her a text asking if she wanted the premarin cream refilled and she said yes. RN advised would update Dr. Talbert Nan. Patient agreeable.   Routing to provider for final review. Patient agreeable to disposition. Will close encounter.

## 2018-02-23 NOTE — Progress Notes (Signed)
 Tyquisha Sharps is a 67 y.o. female     Chief Complaint   Patient presents with   . Pre-op Exam     left eye cataract surgery scheduled for Mon. 02/26/18.       Visit Vitals  BP 137/71 (BP 1 Location: Left arm, BP Patient Position: Sitting)   Pulse 60   Temp 97.8 F (36.6 C) (Oral)   Resp 16   Ht 5' 5 (1.651 m)   Wt 162 lb 6.4 oz (73.7 kg)   SpO2 99%   BMI 27.02 kg/m       Health Maintenance Due   Topic Date Due   . EYE EXAM RETINAL OR DILATED  02/01/1959   . DTaP/Tdap/Td series (1 - Tdap) 01/31/1970   . Shingrix Vaccine Age 69> (1 of 2) 02/01/1999   . FOBT Q 1 YEAR AGE 59-75  02/01/1999   . GLAUCOMA SCREENING Q2Y  01/31/2014   . BREAST CANCER SCRN MAMMOGRAM  03/03/2016   . MICROALBUMIN Q1  06/03/2017   . HEMOGLOBIN A1C Q6M  08/25/2017   . FOOT EXAM Q1  02/23/2018   . LIPID PANEL Q1  02/23/2018   . MEDICARE YEARLY EXAM  02/24/2018       1. Have you been to the ER, urgent care clinic since your last visit?  Hospitalized since your last visit?No    2. Have you seen or consulted any other health care providers outside of the [facility] since your last visit?  Include any pap smears or colon screening. No

## 2018-02-23 NOTE — Progress Notes (Signed)
HISTORY OF PRESENT ILLNESS  Lindsey Huynh is a 67 y.o. female presents for pre op exam  HPI  She is scheduled for left cataract surgery Monday, June 10 with Dr. Deeann Dowse    Reports blurred vision for sometime getting worse     Weight loss; following a healthier diet     Physically active    No blood sugar monitoring     Past Medical History:   Diagnosis Date   ??? Diabetes ([place])    ??? GERD (gastroesophageal reflux disease)    ??? Hypercholesterolemia    ??? Hypertension        /  Current Outpatient Medications   Medication Sig   ??? cyclopentolate (CYCLOGYL) 1 % ophthalmic solution    ??? insulin detemir U-100 (LEVEMIR FLEXTOUCH U-100 INSULN) 100 unit/mL (3 mL) inpn 20 Units by SubCUTAneous route daily (with breakfast).   ??? Insulin Needles, Disposable, (NANO PEN NEEDLE) 32 gauge x 5/32" ndle Use with insulin pen 3 times daily   ??? amLODIPine (NORVASC) 10 mg tablet TAKE 1 TABLET BY MOUTH EVERY DAY   ??? valsartan-hydroCHLOROthiazide (DIOVAN-HCT) 160-25 mg per tablet TAKE 1 TABLET BY MOUTH EVERY DAY   ??? metFORMIN ER (GLUCOPHAGE XR) 500 mg tablet TAKE 2 TABLETS BY MOUTH DAILY WITH DINNER   ??? omeprazole (PRILOSEC) 20 mg capsule Take 20 mg by mouth daily.   ??? SITagliptin (JANUVIA) 50 mg tablet Take 1 Tab by mouth daily.   ??? aspirin delayed-release 81 mg tablet Take 81 mg by mouth daily.     No current facility-administered medications for this visit.        Allergies   Allergen Reactions   ??? Neuromuscular Blockers, Steroidal Other (comments)     GI Upset     Review of Systems   Constitutional: Positive for weight loss (intentional ).   HENT: Negative.    Eyes: Positive for blurred vision. Negative for double vision, photophobia, pain, discharge and redness.   Respiratory: Negative.    Cardiovascular: Negative.  Negative for chest pain.   Gastrointestinal: Negative.    Genitourinary: Negative.    Musculoskeletal: Negative.    Neurological: Negative for dizziness and headaches.   Psychiatric/Behavioral: Negative.      Visit  Vitals  BP 137/71 (BP 1 Location: Left arm, BP Patient Position: Sitting)   Pulse 60   Temp 97.8 ??F (36.6 ??C) (Oral)   Resp 16   Ht 5\' 5"  (1.651 m)   Wt 162 lb 6.4 oz (73.7 kg)   SpO2 99%   BMI 27.02 kg/m??     Physical Exam   Constitutional: She is oriented to person, place, and time. She appears well-developed and well-nourished. No distress.   HENT:   Right Ear: External ear normal.   Left Ear: External ear normal.   Nose: Nose normal.   Mouth/Throat: Oropharynx is clear and moist. No oropharyngeal exudate.   Eyes: Conjunctivae are normal. Right eye exhibits no discharge. Left eye exhibits no discharge.   Neck: No JVD present. Carotid bruit is not present.   Cardiovascular: Normal rate, regular rhythm and normal heart sounds.   Pulmonary/Chest: Effort normal and breath sounds normal.   Musculoskeletal: She exhibits no edema or tenderness.   Neurological: She is alert and oriented to person, place, and time.   Skin: Skin is warm and dry. She is not diaphoretic.   Psychiatric: She has a normal mood and affect. Her behavior is normal. Judgment and thought content normal.     Diabetic  foot exam:     Left Foot:   Visual Exam: normal    Pulse DP: trace   Filament test: normal sensation          Right Foot:   Visual Exam: normal    Pulse DP: trace   Filament test: normal sensation          ASSESSMENT and PLAN    ICD-10-CM ICD-9-CM    1. Cataract of left eye, unspecified cataract type H26.9 366.9    2. Pre-op exam Z01.818 V72.84 CBC WITH AUTOMATED DIFF   3. Uncontrolled type 2 diabetes mellitus with hyperglycemia (HCC) E11.65 250.02 AMB POC HEMOGLOBIN A1C      AMB POC URINE, MICROALBUMIN, SEMIQUANT (3 RESULTS)      AMB POC URINALYSIS DIP STICK AUTO W/O MICRO      AMB POC GLUCOSE BLOOD, BY GLUCOSE MONITORING DEVICE      CANCELED: AMB POC GLUCOSE, QUANTITATIVE, BLOOD   4. Essential hypertension T51 761.6 METABOLIC PANEL, COMPREHENSIVE   5. Hypercholesterolemia E78.00 272.0 LIPID PANEL      METABOLIC PANEL, COMPREHENSIVE    6. Decreased pedal pulses R09.89 785.9    7. Encounter for screening mammogram for malignant neoplasm of breast Z12.31 V76.12 MAM MAMMO BI SCREENING INCL CAD     Follow-up and Dispositions    ?? Return in about 5 weeks (around 03/30/2018) for diabetes.         Hemoglobin A1c 11.0%, was 9.9% random blood sugar 266    She now agrees to start insulin. Will start Lantus 20 units daily  Stop SU  Continue PM Metformin  Discussed importance of regular meals, blood sugar monitoring, follow up visit and call to provider with any concern    Normal ABI 2018    She has declined to restart cholesterol lowering medication     lab results and schedule of future lab studies reviewed with patient  cardiovascular risk and specific lipid/LDL goals reviewed  reviewed medications and side effects in detail  specific diabetic recommendations: home glucose monitoring emphasized, all medications, side effects and compliance discussed carefully, foot care discussed and Podiatry visits discussed, glycohemoglobin and other lab monitoring discussed and long term diabetic complications discussed     Patient not cleared for surgery due to uncontrolled diabetes/hyperglycemia  Surgeon's office called, his nurse was informed. She will call patient to reschedule

## 2018-02-23 NOTE — Progress Notes (Signed)
Lindsey Huynh is a 67 y.o. female     Chief Complaint   Patient presents with   ??? Pre-op Exam     left eye cataract surgery scheduled for Mon. 02/26/18.       Visit Vitals  BP 137/71 (BP 1 Location: Left arm, BP Patient Position: Sitting)   Pulse 60   Temp 97.8 ??F (36.6 ??C) (Oral)   Resp 16   Ht 5\' 5"  (1.651 m)   Wt 162 lb 6.4 oz (73.7 kg)   SpO2 99%   BMI 27.02 kg/m??       Health Maintenance Due   Topic Date Due   ??? EYE EXAM RETINAL OR DILATED  02/01/1959   ??? DTaP/Tdap/Td series (1 - Tdap) 01/31/1970   ??? Shingrix Vaccine Age 75> (1 of 2) 02/01/1999   ??? FOBT Q 1 YEAR AGE 74-75  02/01/1999   ??? GLAUCOMA SCREENING Q2Y  01/31/2014   ??? BREAST CANCER SCRN MAMMOGRAM  03/03/2016   ??? MICROALBUMIN Q1  06/03/2017   ??? HEMOGLOBIN A1C Q6M  08/25/2017   ??? FOOT EXAM Q1  02/23/2018   ??? LIPID PANEL Q1  02/23/2018   ??? MEDICARE YEARLY EXAM  02/24/2018       1. Have you been to the ER, urgent care clinic since your last visit?  Hospitalized since your last visit?No    2. Have you seen or consulted any other health care providers outside of the [place] since your last visit?  Include any pap smears or colon screening. No

## 2018-02-23 NOTE — Patient Instructions (Addendum)
Cataracts: Care Instructions  Your Care Instructions    A cataract is a clouding of the lens of the eye. The lens focuses light on the retina at the back of the eye. Cataracts block some of the light and make it harder for you to see clearly. Cataracts often develop when you get older.  Most cataracts grow slowly. At first, you may just need stronger glasses to help you see better. Later, if the cataracts grow and begin to seriously impair your vision, you can have surgery to remove them.  Follow-up care is a key part of your treatment and safety. Be sure to make and go to all appointments, and call your doctor if you are having problems. It's also a good idea to know your test results and keep a list of the medicines you take.  How can you care for yourself at home?  ?? Move room lights and use window shades to avoid glare.  ?? Use more lighting or higher-watt bulbs.  ?? Use a magnifying glass for reading. Look for large-print books and other reading material to make reading more enjoyable.  ?? Have your eyes checked regularly, and update your glasses when needed.  ?? Wear sunglasses to block out harmful sunlight. Buy sunglasses that screen out ultraviolet A and B (UVA and UVB) rays.  ?? Do not smoke. Smoking can make cataracts worse. If you need help quitting, talk to your doctor about stop-smoking programs and medicines. These can increase your chances of quitting for good.  When should you call for help?  Watch closely for changes in your health, and be sure to contact your doctor if:  ?? ?? Your vision is getting worse.   ?? ?? You have increasing trouble doing everyday tasks, like driving or reading the newspaper, because of your eyesight.   Where can you learn more?  Go to [employer]Wrest[employer].at.  Enter P916 in the search box to learn more about "Cataracts: Care Instructions."  Current as of: April 04, 2017  Content Version: 11.9   ?? 2006-2018 Healthwise, Incorporated. Care instructions adapted under license by [employer] (which disclaims liability or warranty for this information). If you have questions about a medical condition or this instruction, always ask your healthcare professional. [place] any warranty or liability for your use of this information.      Nutrition Tips for Diabetes: After Your Visit  Your Care Instructions  A healthy diet is important to manage diabetes. It helps you lose weight (if you need to) and keep it off. It gives you the nutrition and energy your body needs and helps prevent heart disease. But a diet for diabetes does not mean that you have to eat special foods. You can eat what your family eats, including occasional sweets and other favorites. But you do have to pay attention to how often you eat and how much you eat of certain foods. The right plan for you will give you meals that help you keep your blood sugar at healthy levels.  Try to eat a variety of foods and to spread carbohydrate throughout the day. Carbohydrate raises blood sugar higher and more quickly than any other nutrient does. Carbohydrate is found in sugar, breads and cereals, fruit, starchy vegetables such as potatoes and corn, and milk and yogurt.  You may want to work with a dietitian or diabetes educator to help you plan meals and snacks. A dietitian or diabetes educator also can help you lose weight if that  is one of your goals. The following tips can help you enjoy your meals and stay healthy.  Follow-up care is a key part of your treatment and safety. Be sure to make and go to all appointments, and call your doctor if you are having problems. It???s also a good idea to know your test results and keep a list of the medicines you take.  How can you care for yourself at home?  ?? Learn which foods have carbohydrate and how much carbohydrate to eat. A  dietitian or diabetes educator can help you learn to keep track of how much carbohydrate you eat.  ?? Spread carbohydrate throughout the day. Eat some carbohydrate at all meals, but do not eat too much at any one time.  ?? Plan meals to include food from all the food groups. These are the food groups and some example portion sizes:  ?? Grains: 1 slice of bread (1 ounce), ?? cup of cooked cereal, and 1/3 cup of cooked pasta or rice. These have about 15 grams of carbohydrate in a serving. Choose whole grains such as whole wheat bread or crackers, oatmeal, and brown rice more often than refined grains.  ?? Fruit: 1 small fresh fruit, such as an apple or orange; ?? of a banana; ?? cup of chopped, cooked, or canned fruit; ?? cup of fruit juice; 1 cup of melon or raspberries; and 2 tablespoons of dried fruit. These have about 15 grams of carbohydrate in a serving.  ?? Dairy: 1 cup of nonfat or low-fat milk and 2/3 cup of plain yogurt. These have about 15 grams of carbohydrate in a serving.  ?? Protein foods: Beef, chicken, [place], fish, eggs, tofu, cheese, cottage cheese, and peanut butter. A serving size of meat is 3 ounces, which is about the size of a deck of cards. Examples of meat substitute serving sizes (equal to 1 ounce of meat) are 1/4 cup of cottage cheese, 1 egg, 1 tablespoon of peanut butter, and ?? cup of tofu. These have very little or no carbohydrate per serving.  ?? Vegetables: Starchy vegetables such as ?? cup of cooked dried beans, peas, potatoes, or corn have about 15 grams of carbohydrate. Nonstarchy vegetables have very little carbohydrate, such as 1 cup of raw leafy vegetables (such as spinach), ?? cup of other vegetables (cooked or chopped), and 3/4 cup of vegetable juice.  ?? Use the plate format to plan meals. It is a good, quick way to make sure that you have a balanced meal. It also helps you spread carbohydrate throughout the day. You divide your plate by types of foods. Put  vegetables on half the plate, meat or meat substitutes on one-quarter of the plate, and a grain or starchy vegetable (such as brown rice or a potato) in the final quarter of the plate. To this you can add a small piece of fruit and 1 cup of milk or yogurt, depending on how much carbohydrate you are supposed to eat at a meal.  ?? Talk to your dietitian or diabetes educator about ways to add limited amounts of sweets into your meal plan. You can eat these foods now and then, as long as you include the amount of carbohydrate they have in your daily carbohydrate allowance.  ?? If you drink alcohol, limit it to no more than 1 drink a day for women and 2 drinks a day for men. If you are pregnant, no amount of alcohol is known to be safe.  ??  Protein, fat, and fiber do not raise blood sugar as much as carbohydrate does. If you eat a lot of these nutrients in a meal, your blood sugar will rise more slowly than it would otherwise.  ?? Limit saturated fats, such as those from meat and dairy products. Try to replace it with monounsaturated fat, such as olive oil. This is a healthier choice because people who have diabetes are at higher-than-average risk of heart disease. But use a modest amount of olive oil. A tablespoon of olive oil has 14 grams of fat and 120 calories.  ?? Exercise lowers blood sugar. If you take insulin by shots or pump, you can use less than you would if you were not exercising. Keep in mind that timing matters. If you exercise within 1 hour after a meal, your body may need less insulin for that meal than it would if you exercised 3 hours after the meal. Test your blood sugar to find out how exercise affects your need for insulin.  ?? Exercise on most days of the week. Aim for at least 30 minutes. Exercise helps you stay at a healthy weight and helps your body use insulin. Walking is an easy way to get exercise. Gradually increase the amount you  walk every day. You also may want to swim, bike, or do other activities.  When you eat out  ?? Learn to estimate the serving sizes of foods that have carbohydrate. If you measure food at home, it will be easier to estimate the amount in a serving of restaurant food.  ?? If the meal you order has too much carbohydrate (such as potatoes, corn, or baked beans), ask to have a low-carbohydrate food instead. Ask for a salad or green vegetables.  ?? If you use insulin, check your blood sugar before and after eating out to help you plan how much to eat in the future.  ?? If you eat more carbohydrate at a meal than you had planned, take a walk or do other exercise. This will help lower your blood sugar.   Where can you learn more?   Go to [employer].cy  Enter [phone] in the search box to learn more about "Nutrition Tips for Diabetes: After Your Visit."   ?? 2006-2014 Healthwise, Incorporated. Care instructions adapted under license by [employer] (which disclaims liability or warranty for this information). This care instruction is for use with your licensed healthcare professional. If you have questions about a medical condition or this instruction, always ask your healthcare professional. [place] any warranty or liability for your use of this information.  Content Version: 10.2.346038; Current as of: February 20, 2013                 Learning About Insulin Pens  What is an insulin pen?    An insulin pen is a device for giving insulin shots. It looks like a pen. Inside the pen is a needle and a cartridge filled with insulin. You can set the dose of insulin with a dial on the outside of the pen. You use the pen to give the insulin shot (injection). Both disposable and reusable insulin pens are available.  With a disposable pen, a set amount of insulin comes in the pen ready to use. When the insulin is used up, you throw the pen away. You use a new pen the next time you need insulin.   With a reusable pen, you don't throw the pen away. Instead, you reload  the pen with a pre-measured cartridge of insulin. When the insulin is used up, you insert a new cartridge into the pen.  Disposable and reusable pens both need a new needle with each shot. The needles come in different lengths and widths. Shorter needles will prevent injecting into the muscle, especially in children or people who are lean. Thinner-width needles reduce the pricking sensation. Width is measured by gauge. The higher the number, the thinner the needle.  Why do some people prefer pens?  ?? Most people find that insulin pens are easier to use than a bottle and syringe.  ?? Many people feel less pain (or no pain) with the smaller insulin pen needle, compared to a syringe needle.  ?? Insulin pens may help you give yourself more accurate doses. When you draw insulin into a syringe, you must carefully measure so that you don't get too much or too little. But with a pen, you set a dial for the amount of insulin you want, and then you push the button.  ?? Insulin pens may work better than syringes for people who don't see well or who have problems like arthritis that make it harder to use a syringe.  ?? Using an insulin pen draws less attention from others. You can give yourself insulin with fewer people noticing.  ?? You don't need to carry insulin bottles and syringes everywhere you go. An insulin pen fits into a pocket or purse.  What should you know about insulin pens?  Each pen delivers a different brand and type (or types) of insulin. Some deliver rapid-acting insulin. Others deliver long-acting insulin. And some pens deliver a mixture of both in one shot.  Pens have different colored labels, cartridge holders, or dosing knobs. Many pens have special features. For example, some pens have springs so that it takes less force to deliver a dose of insulin. Other pens have signals you can hear that let you know the insulin has been delivered.  Some have memory to show the amount and time of the last dose.  How do you use an insulin pen?  1. For a reusable pen, put the insulin cartridge into the pen. Disposable pens already have an insulin cartridge. Follow the directions for how to screw a new needle onto your pen.  2. Remove the outer cap from the needle. Keep this cap to use later.  3. Remove the inner cover from the needle. Be careful not to prick yourself.  4. Before each shot, prime the needle. Priming removes air from the needle. Turn the dose knob to 2 units. Hold your pen with the needle pointing up. Tap the cartridge holder gently to move any air bubbles to the top. Push the injection button all the way in. Watch for a stream or drop of insulin to come out of the needle. If it does not, repeat this step again.  5. Clean the area of skin where you will give the shot. If you use alcohol to clean the skin, let it dry. Use a different spot each time you inject insulin. That's because using the same spot every time can cause bumps or pits to form in the skin. For example, inject your insulin above your belly button, then the next time use your upper thigh, and then the next time inject below your belly button.  6. Turn the dose knob to the number of units of insulin you need to inject. Push the needle into your skin. Most people can  inject using a 90-degree angle and without pinching the skin. Adults and children who are very lean and people who use longer needles may need to pinch the skin to avoid injecting into muscle.  7. Put your thumb on the injection button and push it in until it stops. Keep the pen in your skin. Hold the dose knob in for 10 seconds (or to the number that the manufacturer recommends). Then pull the needle out of your skin. Do not rub the area.  8. Put only the outer cap back over the needle. The thin inner cover is harder to put back on, and you could stick yourself.   9. After covering the needle with the outer cap, unscrew the needle and throw it away in a sharps container or other solid plastic container. You can get a sharps container at your drugstore.  10. Always read the insulin package information that tells the best way to store your insulin pen and insulin cartridges. In general, unopened insulin for pens will last longer if it is kept in the refrigerator. After insulin is opened, most manufacturers say to store it at room temperature.  Don't share insulin pens with anyone else who uses insulin. Even when the needle is changed, an insulin pen can carry bacteria or blood that can make another person sick.  Where can you learn more?  Go to [email or web address].  Enter M910 in the search box to learn more about "Learning About Insulin Pens."  Current as of: April 12, 2017  Content Version: 11.9  ?? 2006-2018 Healthwise, Incorporated. Care instructions adapted under license by [employer] (which disclaims liability or warranty for this information). If you have questions about a medical condition or this instruction, always ask your healthcare professional. [place] any warranty or liability for your use of this information.    Stop Glimepiride(Amaryl)    Continue Metformin in evening only    Start Insulin 20 units in morning     Check blood sugar before breakfast, after dinner and before bed and call after 1 week with blood sugar readings

## 2018-02-23 NOTE — Progress Notes (Signed)
HISTORY OF PRESENT ILLNESS  Lindsey Huynh is a 67 y.o. female presents for pre op exam  HPI  She is scheduled for left cataract surgery Monday, June 10 with Dr. Deeann Dowse    Reports blurred vision for sometime getting worse     Weight loss; following a healthier diet     Physically active    No blood sugar monitoring     Past Medical History:   Diagnosis Date   ??? Diabetes ([place])    ??? GERD (gastroesophageal reflux disease)    ??? Hypercholesterolemia    ??? Hypertension        /  Current Outpatient Medications   Medication Sig   ??? cyclopentolate (CYCLOGYL) 1 % ophthalmic solution    ??? insulin detemir U-100 (LEVEMIR FLEXTOUCH U-100 INSULN) 100 unit/mL (3 mL) inpn 20 Units by SubCUTAneous route daily (with breakfast).   ??? Insulin Needles, Disposable, (NANO PEN NEEDLE) 32 gauge x 5/32" ndle Use with insulin pen 3 times daily   ??? amLODIPine (NORVASC) 10 mg tablet TAKE 1 TABLET BY MOUTH EVERY DAY   ??? valsartan-hydroCHLOROthiazide (DIOVAN-HCT) 160-25 mg per tablet TAKE 1 TABLET BY MOUTH EVERY DAY   ??? metFORMIN ER (GLUCOPHAGE XR) 500 mg tablet TAKE 2 TABLETS BY MOUTH DAILY WITH DINNER   ??? omeprazole (PRILOSEC) 20 mg capsule Take 20 mg by mouth daily.   ??? SITagliptin (JANUVIA) 50 mg tablet Take 1 Tab by mouth daily.   ??? aspirin delayed-release 81 mg tablet Take 81 mg by mouth daily.     No current facility-administered medications for this visit.        Allergies   Allergen Reactions   ??? Neuromuscular Blockers, Steroidal Other (comments)     GI Upset     Review of Systems   Constitutional: Positive for weight loss (intentional ).   HENT: Negative.    Eyes: Positive for blurred vision. Negative for double vision, photophobia, pain, discharge and redness.   Respiratory: Negative.    Cardiovascular: Negative.  Negative for chest pain.   Gastrointestinal: Negative.    Genitourinary: Negative.    Musculoskeletal: Negative.    Neurological: Negative for dizziness and headaches.   Psychiatric/Behavioral: Negative.       Visit Vitals  BP 137/71 (BP 1 Location: Left arm, BP Patient Position: Sitting)   Pulse 60   Temp 97.8 ??F (36.6 ??C) (Oral)   Resp 16   Ht 5\' 5"  (1.651 m)   Wt 162 lb 6.4 oz (73.7 kg)   SpO2 99%   BMI 27.02 kg/m??     Physical Exam   Constitutional: She is oriented to person, place, and time. She appears well-developed and well-nourished. No distress.   HENT:   Right Ear: External ear normal.   Left Ear: External ear normal.   Nose: Nose normal.   Mouth/Throat: Oropharynx is clear and moist. No oropharyngeal exudate.   Eyes: Conjunctivae are normal. Right eye exhibits no discharge. Left eye exhibits no discharge.   Neck: No JVD present. Carotid bruit is not present.   Cardiovascular: Normal rate, regular rhythm and normal heart sounds.   Pulmonary/Chest: Effort normal and breath sounds normal.   Musculoskeletal: She exhibits no edema or tenderness.   Neurological: She is alert and oriented to person, place, and time.   Skin: Skin is warm and dry. She is not diaphoretic.   Psychiatric: She has a normal mood and affect. Her behavior is normal. Judgment and thought content normal.     Diabetic  foot exam:     Left Foot:   Visual Exam: normal    Pulse DP: trace   Filament test: normal sensation          Right Foot:   Visual Exam: normal    Pulse DP: trace   Filament test: normal sensation          ASSESSMENT and PLAN    ICD-10-CM ICD-9-CM    1. Cataract of left eye, unspecified cataract type H26.9 366.9    2. Pre-op exam Z01.818 V72.84 CBC WITH AUTOMATED DIFF   3. Uncontrolled type 2 diabetes mellitus with hyperglycemia (HCC) E11.65 250.02 AMB POC HEMOGLOBIN A1C      AMB POC URINE, MICROALBUMIN, SEMIQUANT (3 RESULTS)      AMB POC URINALYSIS DIP STICK AUTO W/O MICRO      AMB POC GLUCOSE BLOOD, BY GLUCOSE MONITORING DEVICE      CANCELED: AMB POC GLUCOSE, QUANTITATIVE, BLOOD   4. Essential hypertension I62 703.5 METABOLIC PANEL, COMPREHENSIVE   5. Hypercholesterolemia E78.00 272.0 LIPID PANEL       METABOLIC PANEL, COMPREHENSIVE   6. Decreased pedal pulses R09.89 785.9    7. Encounter for screening mammogram for malignant neoplasm of breast Z12.31 V76.12 MAM MAMMO BI SCREENING INCL CAD     Follow-up and Dispositions    ?? Return in about 5 weeks (around 03/30/2018) for diabetes.         Hemoglobin A1c 11.0%, was 9.9% random blood sugar 266    She now agrees to start insulin. Will start Lantus 20 units daily  Stop SU  Continue PM Metformin  Discussed importance of regular meals, blood sugar monitoring, follow up visit and call to provider with any concern    Normal ABI 2018    She has declined to restart cholesterol lowering medication     lab results and schedule of future lab studies reviewed with patient  cardiovascular risk and specific lipid/LDL goals reviewed  reviewed medications and side effects in detail  specific diabetic recommendations: home glucose monitoring emphasized, all medications, side effects and compliance discussed carefully, foot care discussed and Podiatry visits discussed, glycohemoglobin and other lab monitoring discussed and long term diabetic complications discussed     Patient not cleared for surgery due to uncontrolled diabetes/hyperglycemia  Surgeon's office called, his nurse was informed. She will call patient to reschedule

## 2018-02-24 LAB — METABOLIC PANEL, COMPREHENSIVE
A-G Ratio: 2 (ref 1.2–2.2)
ALT (SGPT): 14 IU/L (ref 0–32)
AST (SGOT): 10 IU/L (ref 0–40)
Albumin: 4.5 g/dL (ref 3.6–4.8)
Alk. phosphatase: 123 IU/L — ABNORMAL HIGH (ref 39–117)
BUN/Creatinine ratio: 29 — ABNORMAL HIGH (ref 12–28)
BUN: 23 mg/dL (ref 8–27)
Bilirubin, total: <0.2 mg/dL (ref 0.0–1.2)
CO2: 28 mmol/L (ref 20–29)
Calcium: 10.2 mg/dL (ref 8.7–10.3)
Chloride: 96 mmol/L (ref 96–106)
Creatinine: 0.79 mg/dL (ref 0.57–1.00)
GLOBULIN, TOTAL: 2.2 g/dL (ref 1.5–4.5)
Glucose: 230 mg/dL — ABNORMAL HIGH (ref 65–99)
Potassium: 3.6 mmol/L (ref 3.5–5.2)
Protein, total: 6.7 g/dL (ref 6.0–8.5)
Sodium: 137 mmol/L (ref 134–144)

## 2018-02-24 LAB — CBC WITH AUTOMATED DIFF
ABS. BASOPHILS: 0 10*3/uL (ref 0.0–0.2)
ABS. EOSINOPHILS: 0.2 10*3/uL (ref 0.0–0.4)
ABS. IMM. GRANS.: 0 10*3/uL (ref 0.0–0.1)
ABS. MONOCYTES: 0.2 10*3/uL (ref 0.1–0.9)
ABS. NEUTROPHILS: 3.5 10*3/uL (ref 1.4–7.0)
Abs Lymphocytes: 2.1 10*3/uL (ref 0.7–3.1)
BASOPHILS: 0 %
EOSINOPHILS: 3 %
HCT: 37.5 % (ref 34.0–46.6)
HGB: 12.7 g/dL (ref 11.1–15.9)
IMMATURE GRANULOCYTES: 0 %
Lymphocytes: 35 %
MCH: 31.1 pg (ref 26.6–33.0)
MCHC: 33.9 g/dL (ref 31.5–35.7)
MCV: 92 fL (ref 79–97)
MONOCYTES: 4 %
NEUTROPHILS: 58 %
PLATELET: 302 10*3/uL (ref 150–450)
RBC: 4.09 x10E6/uL (ref 3.77–5.28)
RDW: 14.3 % (ref 12.3–15.4)
WBC: 5.9 10*3/uL (ref 3.4–10.8)

## 2018-02-24 LAB — LIPID PANEL
Cholesterol, Total: 222 mg/dL — ABNORMAL HIGH (ref 100–199)
Cholesterol, total: 222 mg/dL — ABNORMAL HIGH (ref 100–199)
HDL Cholesterol: 55 mg/dL (ref >39)
HDL: 55 mg/dL (ref >39)
LDL Calculated: 142 mg/dL — ABNORMAL HIGH (ref 0–99)
LDL, calculated: 142 mg/dL — ABNORMAL HIGH (ref 0–99)
Triglyceride: 126 mg/dL (ref 0–149)
Triglycerides: 126 mg/dL (ref 0–149)
VLDL Cholesterol Calculated: 25 mg/dL (ref 5–40)
VLDL, calculated: 25 mg/dL (ref 5–40)

## 2018-02-24 LAB — CBC WITH AUTO DIFFERENTIAL
Basophils %: 0 %
Basophils Absolute: 0 10*3/uL (ref 0.0–0.2)
Eosinophils %: 3 %
Eosinophils Absolute: 0.2 10*3/uL (ref 0.0–0.4)
Granulocyte Absolute Count: 0 10*3/uL (ref 0.0–0.1)
Hematocrit: 37.5 % (ref 34.0–46.6)
Hemoglobin: 12.7 g/dL (ref 11.1–15.9)
Immature Granulocytes: 0 %
Lymphocytes %: 35 %
Lymphocytes Absolute: 2.1 10*3/uL (ref 0.7–3.1)
MCH: 31.1 pg (ref 26.6–33.0)
MCHC: 33.9 g/dL (ref 31.5–35.7)
MCV: 92 fL (ref 79–97)
Monocytes %: 4 %
Monocytes Absolute: 0.2 10*3/uL (ref 0.1–0.9)
Neutrophils %: 58 %
Neutrophils Absolute: 3.5 10*3/uL (ref 1.4–7.0)
Platelets: 302 10*3/uL (ref 150–450)
RBC: 4.09 x10E6/uL (ref 3.77–5.28)
RDW: 14.3 % (ref 12.3–15.4)
WBC: 5.9 10*3/uL (ref 3.4–10.8)

## 2018-02-24 LAB — COMPREHENSIVE METABOLIC PANEL
ALT: 14 IU/L (ref 0–32)
AST: 10 IU/L (ref 0–40)
Albumin/Globulin Ratio: 2 NA (ref 1.2–2.2)
Albumin: 4.5 g/dL (ref 3.6–4.8)
Alkaline Phosphatase: 123 IU/L — ABNORMAL HIGH (ref 39–117)
BUN: 23 mg/dL (ref 8–27)
Bun/Cre Ratio: 29 NA — ABNORMAL HIGH (ref 12–28)
CO2: 28 mmol/L (ref 20–29)
Calcium: 10.2 mg/dL (ref 8.7–10.3)
Chloride: 96 mmol/L (ref 96–106)
Creatinine: 0.79 mg/dL (ref 0.57–1.00)
Globulin, Total: 2.2 g/dL (ref 1.5–4.5)
Glucose: 230 mg/dL — ABNORMAL HIGH (ref 65–99)
Potassium: 3.6 mmol/L (ref 3.5–5.2)
Sodium: 137 mmol/L (ref 134–144)
Total Bilirubin: <0.2 mg/dL (ref 0.0–1.2)
Total Protein: 6.7 g/dL (ref 6.0–8.5)

## 2018-03-02 ENCOUNTER — Telehealth: Payer: Self-pay | Admitting: Obstetrics and Gynecology

## 2018-03-02 NOTE — Telephone Encounter (Signed)
Patient is asking to talk with a nurse regarding her premarin prescription. Patient is having some trouble getting this approved by her insurance. Patient states her insurance is requesting a formulary exception or to try a different medication.

## 2018-03-02 NOTE — Telephone Encounter (Signed)
Spoke with patient. States Premarin vaginal cream not covered by insurance, has contacted [employer], will require formulary exception or try estrace cream as covered alternative. Patient request to proceed with formulary exception. Has been on premarin vag cream "for years" for atrophic vaginitis. Patient is not out of medication.  Advised patient can submit request through [email or web address], can take up to 72 hrs for response. Our office will notify you of response once received. Patient agreeable.

## 2018-03-05 DIAGNOSIS — Z6825 Body mass index (BMI) 25.0-25.9, adult: Secondary | ICD-10-CM | POA: Diagnosis not present

## 2018-03-05 DIAGNOSIS — R5383 Other fatigue: Secondary | ICD-10-CM | POA: Diagnosis not present

## 2018-03-05 DIAGNOSIS — F3289 Other specified depressive episodes: Secondary | ICD-10-CM | POA: Diagnosis not present

## 2018-03-05 DIAGNOSIS — Z1389 Encounter for screening for other disorder: Secondary | ICD-10-CM | POA: Diagnosis not present

## 2018-03-05 DIAGNOSIS — E559 Vitamin D deficiency, unspecified: Secondary | ICD-10-CM | POA: Diagnosis not present

## 2018-03-05 NOTE — Telephone Encounter (Signed)
Left message to call Sharee Pimple at [phone].   PA approved for premarin vaginal cream 02/17/18 -03/05/19.

## 2018-03-05 NOTE — Telephone Encounter (Signed)
PA for premarin vaginal cream submitted via [email or web address]  Key: [facility]

## 2018-03-05 NOTE — Telephone Encounter (Signed)
Spoke with patient, advised of PA approval. Patient verbalizes understanding.   Routing to provider for final review. Patient is agreeable to disposition. Will close encounter.

## 2018-03-13 ENCOUNTER — Ambulatory Visit (INDEPENDENT_AMBULATORY_CARE_PROVIDER_SITE_OTHER): Payer: Medicare Other | Admitting: Gastroenterology

## 2018-03-13 ENCOUNTER — Encounter: Payer: Self-pay | Admitting: Gastroenterology

## 2018-03-13 VITALS — BP 116/74 | HR 80 | Ht 63.25 in | Wt 148.0 lb

## 2018-03-13 DIAGNOSIS — K642 Third degree hemorrhoids: Secondary | ICD-10-CM

## 2018-03-13 DIAGNOSIS — J3089 Other allergic rhinitis: Secondary | ICD-10-CM | POA: Diagnosis not present

## 2018-03-13 DIAGNOSIS — J301 Allergic rhinitis due to pollen: Secondary | ICD-10-CM | POA: Diagnosis not present

## 2018-03-13 NOTE — Patient Instructions (Signed)
HEMORRHOID BANDING PROCEDURE    FOLLOW-UP CARE   1. The procedure you have had should have been relatively painless since the banding of the area involved does not have nerve endings and there is no pain sensation.  The rubber band cuts off the blood supply to the hemorrhoid and the band may fall off as soon as 48 hours after the banding (the band may occasionally be seen in the toilet bowl following a bowel movement). You may notice a temporary feeling of fullness in the rectum which should respond adequately to plain Tylenol or Motrin.  2. Following the banding, avoid strenuous exercise that evening and resume full activity the next day.  A sitz bath (soaking in a warm tub) or bidet is soothing, and can be useful for cleansing the area after bowel movements.     3. To avoid constipation, take two tablespoons of natural wheat bran, natural oat bran, flax, Benefiber or any over the counter fiber supplement and increase your water intake to 7-8 glasses daily.    4. Unless you have been prescribed anorectal medication, do not put anything inside your rectum for two weeks: No suppositories, enemas, fingers, etc.  5. Occasionally, you may have more bleeding than usual after the banding procedure.  This is often from the untreated hemorrhoids rather than the treated one.  Don't be concerned if there is a tablespoon or so of blood.  If there is more blood than this, lie flat with your bottom higher than your head and apply an ice pack to the area. If the bleeding does not stop within a half an hour or if you feel faint, call our office at [phone] or go to the emergency room.  6. Problems are not common; however, if there is a substantial amount of bleeding, severe pain, chills, fever or difficulty passing urine (very rare) or other problems, you should call us at [phone] or report to the nearest emergency room.  7. Do not stay seated continuously for more than 2-3 hours for a day or two  after the procedure.  Tighten your buttock muscles 10-15 times every two hours and take 10-15 deep breaths every 1-2 hours.  Do not spend more than a few minutes on the toilet if you cannot empty your bowel; instead re-visit the toilet at a later time.     

## 2018-03-13 NOTE — Progress Notes (Signed)
PROCEDURE NOTE: The patient presents with symptomatic grade III hemorrhoids, requesting rubber band ligation of his/her hemorrhoidal disease.  All risks, benefits and alternative forms of therapy were described and informed consent was obtained.   The anorectum was pre-medicated with 0.125% nitroglycerin and [facility] The decision was made to band the right anterior internal hemorrhoid, and the [place] was used to perform band ligation without complication.  Digital anorectal examination was then performed to assure proper positioning of the band, and to adjust the banded tissue as required.  The patient was discharged home without pain or other issues.  Dietary and behavioral recommendations were given and along with follow-up instructions.       The patient will return for  follow-up and possible additional banding as as needed. No complications were encountered and the patient tolerated the procedure well.  Damaris Hippo , MD [phone]

## 2018-03-16 DIAGNOSIS — J329 Chronic sinusitis, unspecified: Secondary | ICD-10-CM | POA: Diagnosis not present

## 2018-03-16 DIAGNOSIS — Z6825 Body mass index (BMI) 25.0-25.9, adult: Secondary | ICD-10-CM | POA: Diagnosis not present

## 2018-03-16 DIAGNOSIS — R05 Cough: Secondary | ICD-10-CM | POA: Diagnosis not present

## 2018-03-30 DIAGNOSIS — J3089 Other allergic rhinitis: Secondary | ICD-10-CM | POA: Diagnosis not present

## 2018-03-30 DIAGNOSIS — J301 Allergic rhinitis due to pollen: Secondary | ICD-10-CM | POA: Diagnosis not present

## 2018-03-31 ENCOUNTER — Encounter

## 2018-04-02 DIAGNOSIS — J3089 Other allergic rhinitis: Secondary | ICD-10-CM | POA: Diagnosis not present

## 2018-04-02 MED ORDER — METFORMIN SR 500 MG 24 HR TABLET
500 mg | ORAL_TABLET | ORAL | 0 refills | Status: DC
Start: 2018-04-02 — End: 2018-04-20

## 2018-04-06 DIAGNOSIS — J3089 Other allergic rhinitis: Secondary | ICD-10-CM | POA: Diagnosis not present

## 2018-04-06 DIAGNOSIS — J301 Allergic rhinitis due to pollen: Secondary | ICD-10-CM | POA: Diagnosis not present

## 2018-04-09 ENCOUNTER — Ambulatory Visit: Payer: MEDICARE | Primary: Family

## 2018-04-10 DIAGNOSIS — F3289 Other specified depressive episodes: Secondary | ICD-10-CM | POA: Diagnosis not present

## 2018-04-10 DIAGNOSIS — R252 Cramp and spasm: Secondary | ICD-10-CM | POA: Diagnosis not present

## 2018-04-10 DIAGNOSIS — E559 Vitamin D deficiency, unspecified: Secondary | ICD-10-CM | POA: Diagnosis not present

## 2018-04-10 DIAGNOSIS — Z6825 Body mass index (BMI) 25.0-25.9, adult: Secondary | ICD-10-CM | POA: Diagnosis not present

## 2018-04-10 DIAGNOSIS — J328 Other chronic sinusitis: Secondary | ICD-10-CM | POA: Diagnosis not present

## 2018-04-13 DIAGNOSIS — J3089 Other allergic rhinitis: Secondary | ICD-10-CM | POA: Diagnosis not present

## 2018-04-13 DIAGNOSIS — J301 Allergic rhinitis due to pollen: Secondary | ICD-10-CM | POA: Diagnosis not present

## 2018-04-19 DIAGNOSIS — J3089 Other allergic rhinitis: Secondary | ICD-10-CM | POA: Diagnosis not present

## 2018-04-19 DIAGNOSIS — J301 Allergic rhinitis due to pollen: Secondary | ICD-10-CM | POA: Diagnosis not present

## 2018-04-20 ENCOUNTER — Encounter

## 2018-04-23 ENCOUNTER — Ambulatory Visit: Payer: MEDICARE | Primary: Family

## 2018-04-23 MED ORDER — VALSARTAN-HYDROCHLOROTHIAZIDE 160 MG-25 MG TAB
160-25 mg | ORAL_TABLET | ORAL | 0 refills | Status: DC
Start: 2018-04-23 — End: 2019-01-14

## 2018-04-23 MED ORDER — AMLODIPINE 10 MG TAB
10 mg | ORAL_TABLET | ORAL | 0 refills | Status: DC
Start: 2018-04-23 — End: 2018-10-17

## 2018-04-23 MED ORDER — METFORMIN SR 500 MG 24 HR TABLET
500 mg | ORAL_TABLET | ORAL | 0 refills | Status: DC
Start: 2018-04-23 — End: 2018-09-24

## 2018-04-24 DIAGNOSIS — J3089 Other allergic rhinitis: Secondary | ICD-10-CM | POA: Diagnosis not present

## 2018-04-24 DIAGNOSIS — J301 Allergic rhinitis due to pollen: Secondary | ICD-10-CM | POA: Diagnosis not present

## 2018-05-02 DIAGNOSIS — J301 Allergic rhinitis due to pollen: Secondary | ICD-10-CM | POA: Diagnosis not present

## 2018-05-02 DIAGNOSIS — J3089 Other allergic rhinitis: Secondary | ICD-10-CM | POA: Diagnosis not present

## 2018-05-07 DIAGNOSIS — J3089 Other allergic rhinitis: Secondary | ICD-10-CM | POA: Diagnosis not present

## 2018-05-07 DIAGNOSIS — J301 Allergic rhinitis due to pollen: Secondary | ICD-10-CM | POA: Diagnosis not present

## 2018-05-09 DIAGNOSIS — D2371 Other benign neoplasm of skin of right lower limb, including hip: Secondary | ICD-10-CM | POA: Diagnosis not present

## 2018-05-09 DIAGNOSIS — D2272 Melanocytic nevi of left lower limb, including hip: Secondary | ICD-10-CM | POA: Diagnosis not present

## 2018-05-09 DIAGNOSIS — L814 Other melanin hyperpigmentation: Secondary | ICD-10-CM | POA: Diagnosis not present

## 2018-05-09 DIAGNOSIS — L719 Rosacea, unspecified: Secondary | ICD-10-CM | POA: Diagnosis not present

## 2018-05-09 DIAGNOSIS — D225 Melanocytic nevi of trunk: Secondary | ICD-10-CM | POA: Diagnosis not present

## 2018-05-09 DIAGNOSIS — D2230 Melanocytic nevi of unspecified part of face: Secondary | ICD-10-CM | POA: Diagnosis not present

## 2018-05-09 DIAGNOSIS — Z808 Family history of malignant neoplasm of other organs or systems: Secondary | ICD-10-CM | POA: Diagnosis not present

## 2018-05-24 DIAGNOSIS — J301 Allergic rhinitis due to pollen: Secondary | ICD-10-CM | POA: Diagnosis not present

## 2018-05-24 DIAGNOSIS — J3089 Other allergic rhinitis: Secondary | ICD-10-CM | POA: Diagnosis not present

## 2018-05-28 DIAGNOSIS — J301 Allergic rhinitis due to pollen: Secondary | ICD-10-CM | POA: Diagnosis not present

## 2018-05-28 DIAGNOSIS — J3089 Other allergic rhinitis: Secondary | ICD-10-CM | POA: Diagnosis not present

## 2018-05-31 ENCOUNTER — Ambulatory Visit: Admit: 2018-05-31 | Discharge: 2018-05-31 | Payer: MEDICARE | Attending: Family | Primary: Family

## 2018-05-31 ENCOUNTER — Ambulatory Visit: Attending: Family | Primary: Family

## 2018-05-31 DIAGNOSIS — E119 Type 2 diabetes mellitus without complications: Secondary | ICD-10-CM

## 2018-05-31 LAB — AMB POC HEMOGLOBIN A1C
Hemoglobin A1C, POC: 7.6 % — AB (ref 4.8–5.6)
Hemoglobin A1c (POC): 7.6 % — AB (ref 4.8–5.6)

## 2018-05-31 MED ORDER — DICLOFENAC 75 MG TAB, DELAYED RELEASE
75 mg | ORAL_TABLET | Freq: Two times a day (BID) | ORAL | 0 refills | Status: DC | PRN
Start: 2018-05-31 — End: 2018-05-31

## 2018-05-31 MED ORDER — TIZANIDINE 4 MG TAB
4 mg | ORAL_TABLET | Freq: Three times a day (TID) | ORAL | 0 refills | Status: DC | PRN
Start: 2018-05-31 — End: 2018-10-30

## 2018-05-31 NOTE — Patient Instructions (Addendum)
Increase Levemir insulin to 24 units daily  Continue Metformin      Learning About Diabetes Food Guidelines  Your Care Instructions    Meal planning is important to manage diabetes. It helps keep your blood sugar at a target level (which you set with your doctor). You don't have to eat special foods. You can eat what your family eats, including sweets once in a while. But you do have to pay attention to how often you eat and how much you eat of certain foods.  You may want to work with a dietitian or a certified diabetes educator (CDE) to help you plan meals and snacks. A dietitian or CDE can also help you lose weight if that is one of your goals.  What should you know about eating carbs?  Managing the amount of carbohydrate (carbs) you eat is an important part of healthy meals when you have diabetes. Carbohydrate is found in many foods.  ?? Learn which foods have carbs. And learn the amounts of carbs in different foods.  ? Bread, cereal, pasta, and rice have about 15 grams of carbs in a serving. A serving is 1 slice of bread (1 ounce), ?? cup of cooked cereal, or 1/3 cup of cooked pasta or rice.  ? Fruits have 15 grams of carbs in a serving. A serving is 1 small fresh fruit, such as an apple or orange; ?? of a banana; ?? cup of cooked or canned fruit; ?? cup of fruit juice; 1 cup of melon or raspberries; or 2 tablespoons of dried fruit.  ? Milk and no-sugar-added yogurt have 15 grams of carbs in a serving. A serving is 1 cup of milk or 2/3 cup of no-sugar-added yogurt.  ? Starchy vegetables have 15 grams of carbs in a serving. A serving is ?? cup of mashed potatoes or sweet potato; 1 cup winter squash; ?? of a small baked potato; ?? cup of cooked beans; or ?? cup cooked corn or green peas.  ?? Learn how much carbs to eat each day and at each meal. A dietitian or CDE can teach you how to keep track of the amount of carbs you eat. This is called carbohydrate counting.   ?? If you are not sure how to count carbohydrate grams, use the Plate Method to plan meals. It is a good, quick way to make sure that you have a balanced meal. It also helps you spread carbs throughout the day.  ? Divide your plate by types of foods. Put non-starchy vegetables on half the plate, meat or other protein food on one-quarter of the plate, and a grain or starchy vegetable in the final quarter of the plate. To this you can add a small piece of fruit and 1 cup of milk or yogurt, depending on how many carbs you are supposed to eat at a meal.  ?? Try to eat about the same amount of carbs at each meal. Do not "save up" your daily allowance of carbs to eat at one meal.  ?? Proteins have very little or no carbs per serving. Examples of proteins are beef, chicken, [place], fish, eggs, tofu, cheese, cottage cheese, and peanut butter. A serving size of meat is 3 ounces, which is about the size of a deck of cards. Examples of meat substitute serving sizes (equal to 1 ounce of meat) are 1/4 cup of cottage cheese, 1 egg, 1 tablespoon of peanut butter, and ?? cup of tofu.  How can you eat  out and still eat healthy?  ?? Learn to estimate the serving sizes of foods that have carbohydrate. If you measure food at home, it will be easier to estimate the amount in a serving of restaurant food.  ?? If the meal you order has too much carbohydrate (such as potatoes, corn, or baked beans), ask to have a low-carbohydrate food instead. Ask for a salad or green vegetables.  ?? If you use insulin, check your blood sugar before and after eating out to help you plan how much to eat in the future.  ?? If you eat more carbohydrate at a meal than you had planned, take a walk or do other exercise. This will help lower your blood sugar.  What else should you know?  ?? Limit saturated fat, such as the fat from meat and dairy products. This is a healthy choice because people who have diabetes are at higher risk of  heart disease. So choose lean cuts of meat and nonfat or low-fat dairy products. Use olive or canola oil instead of butter or shortening when cooking.  ?? Don't skip meals. Your blood sugar may drop too low if you skip meals and take insulin or certain medicines for diabetes.  ?? Check with your doctor before you drink alcohol. Alcohol can cause your blood sugar to drop too low. Alcohol can also cause a bad reaction if you take certain diabetes medicines.  Follow-up care is a key part of your treatment and safety. Be sure to make and go to all appointments, and call your doctor if you are having problems. It's also a good idea to know your test results and keep a list of the medicines you take.  Where can you learn more?  Go to [email or web address].  Enter [phone] in the search box to learn more about "Learning About Diabetes Food Guidelines."  Current as of: April 12, 2017  Content Version: 12.1  ?? 2006-2019 Healthwise, Incorporated. Care instructions adapted under license by [employer] (which disclaims liability or warranty for this information). If you have questions about a medical condition or this instruction, always ask your healthcare professional. [place] any warranty or liability for your use of this information.      Nutrition Tips for Diabetes: After Your Visit  Your Care Instructions  A healthy diet is important to manage diabetes. It helps you lose weight (if you need to) and keep it off. It gives you the nutrition and energy your body needs and helps prevent heart disease. But a diet for diabetes does not mean that you have to eat special foods. You can eat what your family eats, including occasional sweets and other favorites. But you do have to pay attention to how often you eat and how much you eat of certain foods. The right plan for you will give you meals that help you keep your blood sugar at healthy levels.   Try to eat a variety of foods and to spread carbohydrate throughout the day. Carbohydrate raises blood sugar higher and more quickly than any other nutrient does. Carbohydrate is found in sugar, breads and cereals, fruit, starchy vegetables such as potatoes and corn, and milk and yogurt.  You may want to work with a dietitian or diabetes educator to help you plan meals and snacks. A dietitian or diabetes educator also can help you lose weight if that is one of your goals. The following tips can help you enjoy your meals and stay healthy.  Follow-up  care is a key part of your treatment and safety. Be sure to make and go to all appointments, and call your doctor if you are having problems. It???s also a good idea to know your test results and keep a list of the medicines you take.  How can you care for yourself at home?  ?? Learn which foods have carbohydrate and how much carbohydrate to eat. A dietitian or diabetes educator can help you learn to keep track of how much carbohydrate you eat.  ?? Spread carbohydrate throughout the day. Eat some carbohydrate at all meals, but do not eat too much at any one time.  ?? Plan meals to include food from all the food groups. These are the food groups and some example portion sizes:  ?? Grains: 1 slice of bread (1 ounce), ?? cup of cooked cereal, and 1/3 cup of cooked pasta or rice. These have about 15 grams of carbohydrate in a serving. Choose whole grains such as whole wheat bread or crackers, oatmeal, and brown rice more often than refined grains.  ?? Fruit: 1 small fresh fruit, such as an apple or orange; ?? of a banana; ?? cup of chopped, cooked, or canned fruit; ?? cup of fruit juice; 1 cup of melon or raspberries; and 2 tablespoons of dried fruit. These have about 15 grams of carbohydrate in a serving.  ?? Dairy: 1 cup of nonfat or low-fat milk and 2/3 cup of plain yogurt. These have about 15 grams of carbohydrate in a serving.   ?? Protein foods: Beef, chicken, [place], fish, eggs, tofu, cheese, cottage cheese, and peanut butter. A serving size of meat is 3 ounces, which is about the size of a deck of cards. Examples of meat substitute serving sizes (equal to 1 ounce of meat) are 1/4 cup of cottage cheese, 1 egg, 1 tablespoon of peanut butter, and ?? cup of tofu. These have very little or no carbohydrate per serving.  ?? Vegetables: Starchy vegetables such as ?? cup of cooked dried beans, peas, potatoes, or corn have about 15 grams of carbohydrate. Nonstarchy vegetables have very little carbohydrate, such as 1 cup of raw leafy vegetables (such as spinach), ?? cup of other vegetables (cooked or chopped), and 3/4 cup of vegetable juice.  ?? Use the plate format to plan meals. It is a good, quick way to make sure that you have a balanced meal. It also helps you spread carbohydrate throughout the day. You divide your plate by types of foods. Put vegetables on half the plate, meat or meat substitutes on one-quarter of the plate, and a grain or starchy vegetable (such as brown rice or a potato) in the final quarter of the plate. To this you can add a small piece of fruit and 1 cup of milk or yogurt, depending on how much carbohydrate you are supposed to eat at a meal.  ?? Talk to your dietitian or diabetes educator about ways to add limited amounts of sweets into your meal plan. You can eat these foods now and then, as long as you include the amount of carbohydrate they have in your daily carbohydrate allowance.  ?? If you drink alcohol, limit it to no more than 1 drink a day for women and 2 drinks a day for men. If you are pregnant, no amount of alcohol is known to be safe.  ?? Protein, fat, and fiber do not raise blood sugar as much as carbohydrate does. If you eat a lot of  these nutrients in a meal, your blood sugar will rise more slowly than it would otherwise.  ?? Limit saturated fats, such as those from meat and dairy products. Try to  replace it with monounsaturated fat, such as olive oil. This is a healthier choice because people who have diabetes are at higher-than-average risk of heart disease. But use a modest amount of olive oil. A tablespoon of olive oil has 14 grams of fat and 120 calories.  ?? Exercise lowers blood sugar. If you take insulin by shots or pump, you can use less than you would if you were not exercising. Keep in mind that timing matters. If you exercise within 1 hour after a meal, your body may need less insulin for that meal than it would if you exercised 3 hours after the meal. Test your blood sugar to find out how exercise affects your need for insulin.  ?? Exercise on most days of the week. Aim for at least 30 minutes. Exercise helps you stay at a healthy weight and helps your body use insulin. Walking is an easy way to get exercise. Gradually increase the amount you walk every day. You also may want to swim, bike, or do other activities.  When you eat out  ?? Learn to estimate the serving sizes of foods that have carbohydrate. If you measure food at home, it will be easier to estimate the amount in a serving of restaurant food.  ?? If the meal you order has too much carbohydrate (such as potatoes, corn, or baked beans), ask to have a low-carbohydrate food instead. Ask for a salad or green vegetables.  ?? If you use insulin, check your blood sugar before and after eating out to help you plan how much to eat in the future.  ?? If you eat more carbohydrate at a meal than you had planned, take a walk or do other exercise. This will help lower your blood sugar.   Where can you learn more?   Go to [employer].cy  Enter [phone] in the search box to learn more about "Nutrition Tips for Diabetes: After Your Visit."   ?? 2006-2014 Healthwise, Incorporated. Care instructions adapted under license by [employer] (which disclaims liability or warranty for this  information). This care instruction is for use with your licensed healthcare professional. If you have questions about a medical condition or this instruction, always ask your healthcare professional. [place] any warranty or liability for your use of this information.  Content Version: 10.2.346038; Current as of: February 20, 2013                 Learning About Type 2 Diabetes  What is type 2 diabetes?  Insulin is a hormone that helps your body use sugar from your food as energy. Type 2 diabetes happens when your body can't use insulin the right way. Over time, the pancreas can't make enough insulin. If you don't have enough insulin, too much sugar stays in your blood.  If you are overweight, get little or no exercise, or have type 2 diabetes in your family, you are more likely to have problems with the way insulin works in Veterinary surgeon. African Americans, Hispanics, Native Americans, Asian Americans, and Pacific Islanders have a higher risk for type 2 diabetes.  Type 2 diabetes can be prevented or delayed with a healthy lifestyle, which includes staying at a healthy weight, making smart food choices, and getting regular exercise.  What can you expect with type  2 diabetes?  You'll keep hearing about how important it is to keep your blood sugar within a target range. That's because over time, high blood sugar can lead to serious problems. It can:  ?? Harm your eyes, nerves, and kidneys.  ?? Damage your blood vessels, leading to heart disease and stroke.  ?? Reduce blood flow and cause nerve damage to parts of your body, especially your feet. This can cause slow healing and pain when you walk.  ?? Make your immune system weak and less able to fight infections.  When people hear the word "diabetes," they often think of problems like these. But daily care and treatment can help prevent or delay these problems. The goal is to keep your blood sugar in a target range. That's  the best way to reduce your chance of having more problems from diabetes.  What are the symptoms?  Some people who have type 2 diabetes may not have any symptoms early on. Many people with the disease don't even know they have it at first. But with time, diabetes starts to cause symptoms. You experience most symptoms of type 2 diabetes when your blood sugar is either too high or too low.  The most common symptoms of high blood sugar include:  ?? Thirst.  ?? Frequent urination.  ?? Weight loss.  ?? Blurry vision.  The symptoms of low blood sugar include:  ?? Sweating.  ?? Shakiness.  ?? Weakness.  ?? Hunger.  ?? Confusion.  How can you prevent type 2 diabetes?  The best way to prevent or delay type 2 diabetes is to adopt healthy habits, which include:  ?? Staying at a healthy weight.  ?? Exercising regularly.  ?? Eating healthy foods.  How is type 2 diabetes treated?  If you have type 2 diabetes, here are the most important things you can do.  ?? Take your diabetes medicines.  ?? Check your blood sugar as often as your doctor recommends. Also, get a hemoglobin A1c test at least every 6 months.  ?? Try to eat a variety of foods and to spread carbohydrate throughout the day. Carbohydrate raises blood sugar higher and more quickly than any other nutrient does. Carbohydrate is found in sugar, breads and cereals, fruit, starchy vegetables such as potatoes and corn, and milk and yogurt.  ?? Get at least 30 minutes of exercise on most days of the week. Walking is a good choice. You also may want to do other activities, such as running, swimming, cycling, or playing tennis or team sports. If your doctor says it's okay, do muscle-strengthening exercises at least 2 times a week.  ?? See your doctor for checkups and tests on a regular schedule.  ?? If you have high blood pressure or high cholesterol, take the medicines as prescribed by your doctor.  ?? Do not smoke. Smoking can make health problems worse. This includes  problems you might have with type 2 diabetes. If you need help quitting, talk to your doctor about stop-smoking programs and medicines. These can increase your chances of quitting for good.  Follow-up care is a key part of your treatment and safety. Be sure to make and go to all appointments, and call your doctor if you are having problems. It's also a good idea to know your test results and keep a list of the medicines you take.  Where can you learn more?  Go to [email or web address].  Enter [phone] in the search box to learn more about "Learning  About Type 2 Diabetes."  Current as of: April 12, 2017  Content Version: 12.1  ?? 2006-2019 Healthwise, Incorporated. Care instructions adapted under license by [employer] (which disclaims liability or warranty for this information). If you have questions about a medical condition or this instruction, always ask your healthcare professional. [place] any warranty or liability for your use of this information.         Learning About High Blood Pressure  What is high blood pressure?    Blood pressure is a measure of how hard the blood pushes against the walls of your arteries. It's normal for blood pressure to go up and down throughout the day, but if it stays up, you have high blood pressure. Another name for high blood pressure is hypertension.  Two numbers tell you your blood pressure. The first number is the systolic pressure. It shows how hard the blood pushes when your heart is pumping. The second number is the diastolic pressure. It shows how hard the blood pushes between heartbeats, when your heart is relaxed and filling with blood.  Your doctor will give you a goal for your blood pressure. Your goal will be based on your health and your age. High blood pressure (hypertension) means that the top number stays high, or the bottom number stays high, or both.   High blood pressure increases the risk of stroke, heart attack, and other problems. You and your doctor will talk about your risks of these problems based on your blood pressure.  What happens when you have high blood pressure?  ?? Blood flows through your arteries with too much force. Over time, this damages the walls of your arteries. But you can't feel it. High blood pressure usually doesn't cause symptoms.  ?? Fat and calcium start to build up in your arteries. This buildup is called plaque. Plaque makes your arteries narrower and stiffer. Blood can't flow through them as easily.  ?? This lack of good blood flow starts to damage some of the organs in your body. This can lead to problems such as coronary artery disease and heart attack, heart failure, stroke, kidney failure, and eye damage.  How can you prevent high blood pressure?  ?? Stay at a healthy weight.  ?? Try to limit how much sodium you eat to less than 2,300 milligrams (mg) a day. If you limit your sodium to 1,500 mg a day, you can lower your blood pressure even more.  ? Buy foods that are labeled "unsalted," "sodium-free," or "low-sodium." Foods labeled "reduced-sodium" and "light sodium" may still have too much sodium.  ? Flavor your food with garlic, lemon juice, onion, vinegar, herbs, and spices instead of salt. Do not use soy sauce, steak sauce, onion salt, garlic salt, mustard, or ketchup on your food.  ? Use less salt (or none) when recipes call for it. You can often use half the salt a recipe calls for without losing flavor.  ?? Be physically active. Get at least 30 minutes of exercise on most days of the week. Walking is a good choice. You also may want to do other activities, such as running, swimming, cycling, or playing tennis or team sports.  ?? Limit alcohol to 2 drinks a day for men and 1 drink a day for women.  ?? Eat plenty of fruits, vegetables, and low-fat dairy products. Eat less saturated and total fats.   How is high blood pressure treated?  ?? Your doctor will suggest making lifestyle  changes to help your heart. For example, your doctor may ask you to eat healthy foods, quit smoking, lose extra weight, and be more active.  ?? If lifestyle changes don't help enough, your doctor may recommend that you take medicine.  ?? When blood pressure is very high, medicines are needed to lower it.  Follow-up care is a key part of your treatment and safety. Be sure to make and go to all appointments, and call your doctor if you are having problems. It's also a good idea to know your test results and keep a list of the medicines you take.  Where can you learn more?  Go to [email or web address].  Enter P501 in the search box to learn more about "Learning About High Blood Pressure."  Current as of: April 09, 2017  Content Version: 12.1  ?? 2006-2019 Healthwise, Incorporated. Care instructions adapted under license by [employer] (which disclaims liability or warranty for this information). If you have questions about a medical condition or this instruction, always ask your healthcare professional. [place] any warranty or liability for your use of this information.         Shoulder Stretches: Exercises  Your Care Instructions  Here are some examples of exercises for your shoulder. Start each exercise slowly. Ease off the exercise if you start to have pain.  Your doctor or physical therapist will tell you when you can start these exercises and which ones will work best for you.  How to do the exercises  Shoulder stretch    1. Stand in a doorway and place one arm against the door frame. Your elbow should be a little higher than your shoulder.  2. Relax your shoulders as you lean forward, allowing your chest and shoulder muscles to stretch. You can also turn your body slightly away from your arm to stretch the muscles even more.  3. Hold for 15 to 30 seconds.   4. Repeat 2 to 4 times with each arm.    Shoulder and chest stretch    1. Shoulder and chest stretch  2. While sitting, relax your upper body so you slump slightly in your chair.  3. As you breathe in, straighten your back and open your arms out to the sides.  4. Gently pull your shoulder blades back and downward.  5. Hold for 15 to 30 seconds as your breathe normally.  6. Repeat 2 to 4 times.    Overhead stretch    1. Reach up over your head with both arms.  2. Hold for 15 to 30 seconds.  3. Repeat 2 to 4 times.    Follow-up care is a key part of your treatment and safety. Be sure to make and go to all appointments, and call your doctor if you are having problems. It's also a good idea to know your test results and keep a list of the medicines you take.  Where can you learn more?  Go to [email or web address].  Enter S254 in the search box to learn more about "Shoulder Stretches: Exercises."  Current as of: June 08, 2017  Content Version: 12.1  ?? 2006-2019 Healthwise, Incorporated. Care instructions adapted under license by [employer] (which disclaims liability or warranty for this information). If you have questions about a medical condition or this instruction, always ask your healthcare professional. [place] any warranty or liability for your use of this information.         Shoulder Blade: Exercises  Your  Care Instructions  Here are some examples of typical exercises for your condition. Start each exercise slowly. Ease off the exercise if you start to have pain. Your doctor or physical therapist will tell you when you can start these exercises and which ones will work best for you.  How to do the exercises  Shoulder roll    5. Stand tall with your chin slightly tucked. Imagine that a string at the top of your head is pulling you straight up.  6. Keep your arms relaxed. All motion will be in your shoulders.   7. Shrug your shoulders up toward your ears, then up and back. Circle your shoulders down and back, like you're sliding your hands down into your back pants pockets.  8. Repeat the circles at least 2 to 4 times.  9. This exercise is also helpful anytime you want to relax.    Lower neck and upper back stretch    7. With your arms about shoulder height, clasp your hands in front of you.  8. Drop your chin toward your chest.  9. Reach straight forward so you are rounding your upper back. Think about pulling your shoulder blades apart. You'll feel a stretch across your upper back and shoulders. Hold for at least 6 seconds.  10. Repeat 2 to 4 times.    Triceps stretch    4. Reach your arm straight up.  5. Keeping your elbow in place, bend your arm and reach your hand down behind your back.  6. With your other hand, apply gentle pressure to the bent elbow. You'll feel a stretch at the back of your upper arm and shoulder. Hold about 6 seconds.  7. Repeat 2 to 4 times with each arm.    Shoulder stretch    1. Relax your shoulders.  2. Raise one arm to shoulder height, and reach it across your chest.  3. Pull the arm slightly toward you with your other arm. This will help you get a gentle stretch. Hold for about 6 seconds.  4. Repeat 2 to 4 times.    Shoulder blade squeeze    1. Sit or stand up tall with your arms at your sides.  2. Keep your shoulders relaxed and down, not shrugged.  3. Squeeze your shoulder blades together. Hold for 6 seconds, then relax.  4. Repeat 8 to 12 times.    Straight-arm shoulder blade squeeze    1. Sit or stand tall. Relax your shoulders.  2. With palms down, hold your elastic tubing or band straight out in front of you.  3. Start with slight tension in the tubing or band, with your hands about shoulder-width apart.  4. Slowly pull straight out to the sides, squeezing your shoulder blades together. Keep your arms straight and at shoulder height. Slowly release.  5. Repeat 8 to 12 times.    Rowing     1. Anchor your elastic tubing or band at about waist height. Take one end in each hand.  2. Sit or stand with your feet hip-width apart.  3. Hold your arms straight in front of you. Adjust your distance to create slight tension in the tubing or band.  4. Slightly tuck your chin. Relax your shoulders.  5. Without shrugging your shoulders, pull straight back. Your elbows will pass alongside your waist.    Pull-downs    1. Anchor your elastic tubing or band in the top of a closed door. Take one end in each hand.  2. Either sit or stand, depending on what is more comfortable. If you feel unsteady, sit on a chair.  3. Start with your arms up and comfortably apart, elbows straight. There should be a slight tension in the tubing or band.  4. Slightly tuck your chin, and look straight ahead.  5. Keeping your back straight, slowly pull down and back, bending your elbows.  6. Stop where your hands are level with your chin, in a "goalpost" position.  7. Repeat 8 to 12 times.    Chest T stretch    1. Lie on your back. Raise your knees so they are bent. Plant your feet on the floor, hip-width apart.  2. Tuck your chin, and relax your shoulders.  3. Reach your arms straight out to the sides. If you don't feel a mild stretch in your shoulders and across your chest, use a foam roll or a tightly rolled blanket under your spine, from your tailbone to your head.  4. Relax in this position for at least 15 to 30 seconds while you breathe normally. Repeat 2 to 4 times.  5. As you get used to this stretch, keep adding a little more time until you are able relax in this position for 2 or 3 minutes. When you can relax for at least 2 minutes, you only need to do the exercise 1 time per session.    Chest goalpost stretch    1. Lie on your back. Raise your knees so they are bent. Plant your feet on the floor, hip-width apart.  2. Tuck your chin, and relax your shoulders.  3. Reach your arms straight out to the sides.   4. Bend your arms at the elbows, with your hands pointed toward the top of your head. Your arms should make an L on either side of your head. Your palms should be facing up.  5. If you don't feel a mild stretch in your shoulders and across your chest, use a foam roll or tightly rolled blanket under your spine, from your tailbone to your head.  6. Relax in this position for at least 15 to 30 seconds while you breathe normally. Repeat 2 to 4 times.  7. Each day you do this exercise, add a little more time until you can relax in this position for 2 or 3 minutes. When you can relax for at least 2 minutes, you only need to do the exercise 1 time per session.    Follow-up care is a key part of your treatment and safety. Be sure to make and go to all appointments, and call your doctor if you are having problems. It's also a good idea to know your test results and keep a list of the medicines you take.  Where can you learn more?  Go to [employer].  Enter [phone] in the search box to learn more about "Shoulder Blade: Exercises."  Current as of: June 08, 2017  Content Version: 12.1  ?? 2006-2019 [employer]. Care instructions adapted under license by [employer] (which disclaims liability or warranty for this information). If you have questions about a medical condition or this instruction, always ask your healthcare professional. [place] any warranty or liability for your use of this information.
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
## 2018-06-01 MED ORDER — DICLOFENAC 75 MG TAB, DELAYED RELEASE
75 mg | ORAL_TABLET | ORAL | 0 refills | Status: DC
Start: 2018-06-01 — End: 2018-10-30

## 2018-06-07 DIAGNOSIS — J3089 Other allergic rhinitis: Secondary | ICD-10-CM | POA: Diagnosis not present

## 2018-06-07 DIAGNOSIS — J301 Allergic rhinitis due to pollen: Secondary | ICD-10-CM | POA: Diagnosis not present

## 2018-06-12 DIAGNOSIS — J301 Allergic rhinitis due to pollen: Secondary | ICD-10-CM | POA: Diagnosis not present

## 2018-06-12 DIAGNOSIS — J3089 Other allergic rhinitis: Secondary | ICD-10-CM | POA: Diagnosis not present

## 2018-06-14 ENCOUNTER — Inpatient Hospital Stay: Admit: 2018-06-14 | Payer: MEDICARE | Attending: Rehabilitative and Restorative Service Providers" | Primary: Family

## 2018-06-14 DIAGNOSIS — M79602 Pain in left arm: Secondary | ICD-10-CM
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
## 2018-06-19 ENCOUNTER — Inpatient Hospital Stay: Admit: 2018-06-19 | Payer: MEDICARE | Attending: Rehabilitative and Restorative Service Providers" | Primary: Family

## 2018-06-19 DIAGNOSIS — J301 Allergic rhinitis due to pollen: Secondary | ICD-10-CM | POA: Diagnosis not present

## 2018-06-19 DIAGNOSIS — J3089 Other allergic rhinitis: Secondary | ICD-10-CM | POA: Diagnosis not present

## 2018-06-19 DIAGNOSIS — M25512 Pain in left shoulder: Secondary | ICD-10-CM

## 2018-06-19 NOTE — Progress Notes (Signed)
 PT DAILY TREATMENT NOTE - MCR 2-15    Patient Name: Lindsey Huynh  Date:06/19/2018  DOB: [birth year]  [x]   Patient DOB Verified  Payor: VA MEDICARE / Plan: VA MEDICARE PART A / Product Type: Medicare /    In time: 1:00 PM  Out time:2:05 PM  Total Treatment Time (min): 65  Total Timed Codes (min): 50  1:1 Treatment Time (MC only): 30   Visit #:  2    Treatment Area: Left arm pain [M79.602]    SUBJECTIVE  Pain Level (0-10 scale): 7/10  Any medication changes, allergies to medications, adverse drug reactions, diagnosis change, or new procedure performed?: [x]  No    []  Yes (see summary sheet for update)  Subjective functional status/changes:   []  No changes reported  Patient states compliance with HEP, she is using 2x/day which helps manage her symptoms. She has mostly been using her R UE for ADL's but continues to have difficulty lifting her L UE overhead.    OBJECTIVE    Modality rationale: decrease pain and increase tissue extensibility to improve the patient's ability to reach overhead   Min Type Additional Details      10 [x]  Estim: [] Att   [x] Unatt    [] TENS instruct                  [x] IFC  [] Premod   [] NMES                     [] Other:  [] w/US    [] w/ice   [x] w/heat  Position: seated with L UE supported  Location: L RTC       []   Traction: []  Cervical       [] Lumbar                       []  Prone          [] Supine                       [] Intermittent   [] Continuous Lbs:  []  before manual  []  after manual  [] w/heat    []   Ultrasound: [] Continuous   []  Pulsed                       at: []   [] Location:  W/cm2:    []  Paraffin         Location:   [] w/heat    []   Ice     []   Heat  []   Ice massage Position:  Location:    []   Laser  []   Other: Position:  Location:      []   Vasopneumatic Device Pressure:       []  lo []  med []  hi   Temperature:      [x]  Skin assessment post-treatment:  [x] intact [] redness- no adverse reaction    [] redness - adverse reaction:     40 min Therapeutic Exercise:  [x]  See flow sheet :  reviewed HEP, added supine cane shoulder flexion and ER   Rationale: increase ROM, increase strength and improve coordination to improve the patient's ability to reach overhead    15 min Manual Therapy: L GH joint mobs grade I-II/ oscillations to reduce pain and muscle guarding, PROM L shoulder flexion, abduction, ER and IR, STM/TPR L middle and posterior deltoid, Infraspinatus muscles   Rationale: decrease pain, increase ROM, increase tissue extensibility and decrease trigger points to improve the  patient's ability to reach overhead            With   [x]  TE   []  TA   []  neuro   []  other: Patient Education: [x]  Review HEP    []  Progressed/Changed HEP based on:   []  positioning   []  body mechanics   []  transfers   [x]  heat/ice application    [x]  other: use of pillow to support L UE while sitting and when sleeping, continue with heat but to also try ice to reduce pain and inflammation     Other Objective/Functional Measures: moderate to severe TTP L middle and posterior deltoid> infraspinatus     Pain Level (0-10 scale) post treatment: 6/10    ASSESSMENT/Changes in Function:   Patient tolerated new exercises well, significant soft tissue irritation noted L shoulder.    Patient will continue to benefit from skilled PT services to modify and progress therapeutic interventions, address functional mobility deficits, address ROM deficits, address strength deficits, analyze and address soft tissue restrictions, analyze and cue movement patterns and instruct in home and community integration to attain remaining goals.     []   See Plan of Care  []   See progress note/recertification  []   See Discharge Summary         Progress towards goals / Updated goals:  NT    PLAN  [x]   Upgrade activities as tolerated     [x]   Continue plan of care  []   Update interventions per flow sheet       []   Discharge due to:_  []   Other:_      Raenette LOISE Pinal, PTA 06/19/2018

## 2018-06-19 NOTE — Progress Notes (Signed)
PT DAILY TREATMENT NOTE - MCR 2-15    Patient Name: Lindsey Huynh  Date:06/19/2018  DOB: 1951-07-27  [x]   Patient DOB Verified  Payor: [place] MEDICARE / Plan: [place] MEDICARE PART A / Product Type: Medicare /    In time: 1:00 PM  Out time:2:05 PM  Total Treatment Time (min): 65  Total Timed Codes (min): 50  1:1 Treatment Time (MC only): 30   Visit #:  2    Treatment Area: Left arm pain [M79.602]    SUBJECTIVE  Pain Level (0-10 scale): 7/10  Any medication changes, allergies to medications, adverse drug reactions, diagnosis change, or new procedure performed?: [x]  No    []  Yes (see summary sheet for update)  Subjective functional status/changes:   []  No changes reported  Patient states compliance with HEP, she is using 2x/day which helps manage her symptoms. She has mostly been using her R UE for ADL's but continues to have difficulty lifting her L UE overhead.    OBJECTIVE    Modality rationale: decrease pain and increase tissue extensibility to improve the patient???s ability to reach overhead   Min Type Additional Details      10 [x]  Estim: [] Att   [x] Unatt    [] TENS instruct                  [x] IFC  [] Premod   [] NMES                     [] Other:  [] w/US   [] w/ice   [x] w/heat  Position: seated with L UE supported  Location: L RTC       []   Traction: []  Cervical       [] Lumbar                       []  Prone          [] Supine                       [] Intermittent   [] Continuous Lbs:  []  before manual  []  after manual  [] w/heat    []   Ultrasound: [] Continuous   []  Pulsed                       at: [] 1MHz   [] 3MHz Location:  W/cm2:    []  Paraffin         Location:   [] w/heat    []   Ice     []   Heat  []   Ice massage Position:  Location:    []   Laser  []   Other: Position:  Location:      []   Vasopneumatic Device Pressure:       []  lo []  med []  hi   Temperature:      [x]  Skin assessment post-treatment:  [x] intact [] redness- no adverse reaction    [] redness ??? adverse reaction:      40 min Therapeutic Exercise:  [x]  See flow sheet : reviewed HEP, added supine cane shoulder flexion and ER   Rationale: increase ROM, increase strength and improve coordination to improve the patient???s ability to reach overhead    15 min Manual Therapy: L GH joint mobs grade I-II/ oscillations to reduce pain and muscle guarding, PROM L shoulder flexion, abduction, ER and IR, STM/TPR L middle and posterior deltoid, Infraspinatus muscles   Rationale: decrease pain, increase ROM, increase tissue extensibility and decrease trigger points to improve the  patient???s ability to reach overhead            With   [x]  TE   []  TA   []  neuro   []  other: Patient Education: [x]  Review HEP    []  Progressed/Changed HEP based on:   []  positioning   []  body mechanics   []  transfers   [x]  heat/ice application    [x]  other: use of pillow to support L UE while sitting and when sleeping, continue with heat but to also try ice to reduce pain and inflammation     Other Objective/Functional Measures: moderate to severe TTP L middle and posterior deltoid> infraspinatus     Pain Level (0-10 scale) post treatment: 6/10    ASSESSMENT/Changes in Function:   Patient tolerated new exercises well, significant soft tissue irritation noted L shoulder.    Patient will continue to benefit from skilled PT services to modify and progress therapeutic interventions, address functional mobility deficits, address ROM deficits, address strength deficits, analyze and address soft tissue restrictions, analyze and cue movement patterns and instruct in home and community integration to attain remaining goals.     []   See Plan of Care  []   See progress note/recertification  []   See Discharge Summary         Progress towards goals / Updated goals:  NT    PLAN  [x]   Upgrade activities as tolerated     [x]   Continue plan of care  []   Update interventions per flow sheet       []   Discharge due to:_  []   Other:_      Erick Colace, PTA 06/19/2018

## 2018-06-22 ENCOUNTER — Inpatient Hospital Stay: Admit: 2018-06-22 | Payer: MEDICARE | Attending: Rehabilitative and Restorative Service Providers" | Primary: Family

## 2018-06-22 DIAGNOSIS — J3089 Other allergic rhinitis: Secondary | ICD-10-CM | POA: Diagnosis not present

## 2018-06-22 DIAGNOSIS — J301 Allergic rhinitis due to pollen: Secondary | ICD-10-CM | POA: Diagnosis not present
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
## 2018-06-25 ENCOUNTER — Inpatient Hospital Stay: Admit: 2018-06-25 | Payer: MEDICARE | Attending: Rehabilitative and Restorative Service Providers" | Primary: Family

## 2018-06-25 DIAGNOSIS — J301 Allergic rhinitis due to pollen: Secondary | ICD-10-CM | POA: Diagnosis not present

## 2018-06-25 DIAGNOSIS — J3089 Other allergic rhinitis: Secondary | ICD-10-CM | POA: Diagnosis not present

## 2018-06-25 NOTE — Progress Notes (Signed)
PT DAILY TREATMENT NOTE - MCR 2-15    Patient Name: Lindsey Huynh  Date:06/25/2018  DOB: [birth year]  [x]   Patient DOB Verified  [place]or: [place] MED[place]ARE / Plan: [place] MEDICARE PART A / Product Type: Medicare /    In time: 9:35 AM  Out time:10:50 AM  Total Treatment Time (min): 75  Total Timed Codes (min): 60  1:1 Treatment Time (MC only): 39  Visit #:  4    Treatment Area: Left arm pain [M79.602]    SUBJECTIVE  Pain Level (0-10 scale): 5/10  Any medication changes, allergies to medications, adverse drug reactions, diagnosis change, or new procedure performed?: [x]  No    []  Yes (see summary sheet for update)  Subjective functional status/changes:   []  No changes reported  Patient states she felt stiff over the weekend, did her exercises and utilized heat which "felt better" Reports she took a muscle relaxer this Diclofenac this morning before coming to PT. She describes her L shoulder pain as a "deep ache that's annoying."    OBJECTIVE    Modality rationale: decrease pain and increase tissue extensibility to improve the patient???s ability to reach overhead   Min Type Additional Details      15 [x]  Estim: [] Att   [x] Unatt    [] TENS instruct                  [x] IFC  [] Premod   [] NMES                     [] Other:  [] w/US   [] w/ice   [x] w/heat  Position: seated with L UE supported  Location: L RTC       []   Traction: []  Cervical       [] Lumbar                       []  Prone          [] Supine                       [] Intermittent   [] Continuous Lbs:  []  before manual  []  after manual  [] w/heat    []   Ultrasound: [] Continuous   []  Pulsed                       at: [] 1MHz   [] 3MHz Location:  W/cm2:    []  Paraffin         Location:   [] w/heat    []   Ice     [x]   Heat  []   Ice massage Position: supine  Location: L shoulder    []   Laser  []   Other: Position:  Location:      []   Vasopneumatic Device Pressure:       []  lo []  med []  hi   Temperature:      [x]  Skin assessment post-treatment:  [x] intact [] redness- no adverse reaction     [] redness ??? adverse reaction:     40 min Therapeutic Exercise:  [x]  See flow sheet : added wall slides, supine chin tuck, progressed isometric ER to isometric walkout using yellow band   Rationale: increase ROM, increase strength and improve coordination to improve the patient???s ability to reach overhead    20 min Manual Therapy: L GH joint mobs grade I-II/ oscillations to reduce pain and muscle guarding, PROM L shoulder flexion, abduction, ER and IR, STM/TPR L middle and posterior deltoid, Infraspinatus and  teres major/minor muscles in R s/l, L scapula mobs into posterior tilt and downward rotation in R s/l   Rationale: decrease pain, increase ROM, increase tissue extensibility and decrease trigger points to improve the patient???s ability to reach overhead            With   [x]  TE   []  TA   []  neuro   []  other: Patient Education: [x]  Review HEP    []  Progressed/Changed HEP based on:   []  positioning   []  body mechanics   []  transfers   [x]  heat/ice application    [x]  other: use of towel roll to reduce pressure along L shoulder while driving     Other Objective/Functional Measures: Mild moderate  TTP L middle and posterior deltoid  Moderate TTP infraspinatus and Teres major/minor    AROM L shoulder:  Flexion: 113 degrees  Scaption: 97 degrees    PROM:   Flexion: 122 degrees  Scaption: 115 degrees     Pain Level (0-10 scale) post treatment: not reported /10    ASSESSMENT/Changes in Function:   Decreased TTP and increased P/AROM compared to initial visit and overall decreased pain levels. Tolerated new exercises well today.   Patient will continue to benefit from skilled PT services to modify and progress therapeutic interventions, address functional mobility deficits, address ROM deficits, address strength deficits, analyze and address soft tissue restrictions, analyze and cue movement patterns and instruct in home and community integration to attain remaining goals.     []   See Plan of Care   []   See progress note/recertification  []   See Discharge Summary         Progress towards goals / Updated goals:  NT    PLAN  [x]   Upgrade activities as tolerated     [x]   Continue plan of care  []   Update interventions per flow sheet       []   Discharge due to:_  [x]   Other:progress HEP next visit      Erick Colace, PTA 06/25/2018

## 2018-06-25 NOTE — Progress Notes (Signed)
 PT DAILY TREATMENT NOTE - MCR 2-15    Patient Name: Lindsey Huynh  Date:06/25/2018  DOB: 1951/03/08  [x]   Patient DOB Verified  Payor: [place] MEDICARE / Plan: [place] MEDICARE PART A / Product Type: Medicare /    In time: 9:35 AM  Out time:10:50 AM  Total Treatment Time (min): 75  Total Timed Codes (min): 60  1:1 Treatment Time (MC only): 55  Visit #:  4    Treatment Area: Left arm pain [M79.602]    SUBJECTIVE  Pain Level (0-10 scale): 5/10  Any medication changes, allergies to medications, adverse drug reactions, diagnosis change, or new procedure performed?: [x]  No    []  Yes (see summary sheet for update)  Subjective functional status/changes:   []  No changes reported  Patient states she felt stiff over the weekend, did her exercises and utilized heat which felt better Reports she took a muscle relaxer this Diclofenac this morning before coming to PT. She describes her L shoulder pain as a deep ache that's annoying.    OBJECTIVE    Modality rationale: decrease pain and increase tissue extensibility to improve the patient's ability to reach overhead   Min Type Additional Details      15 [x]  Estim: [] Att   [x] Unatt    [] TENS instruct                  [x] IFC  [] Premod   [] NMES                     [] Other:  [] w/US    [] w/ice   [x] w/heat  Position: seated with L UE supported  Location: L RTC       []   Traction: []  Cervical       [] Lumbar                       []  Prone          [] Supine                       [] Intermittent   [] Continuous Lbs:  []  before manual  []  after manual  [] w/heat    []   Ultrasound: [] Continuous   []  Pulsed                       at: []   [] Location:  W/cm2:    []  Paraffin         Location:   [] w/heat    []   Ice     [x]   Heat  []   Ice massage Position: supine  Location: L shoulder    []   Laser  []   Other: Position:  Location:      []   Vasopneumatic Device Pressure:       []  lo []  med []  hi   Temperature:      [x]  Skin assessment post-treatment:  [x] intact [] redness- no adverse reaction     [] redness - adverse reaction:     40 min Therapeutic Exercise:  [x]  See flow sheet : added wall slides, supine chin tuck, progressed isometric ER to isometric walkout using yellow band   Rationale: increase ROM, increase strength and improve coordination to improve the patient's ability to reach overhead    20 min Manual Therapy: L GH joint mobs grade I-II/ oscillations to reduce pain and muscle guarding, PROM L shoulder flexion, abduction, ER and IR, STM/TPR L middle and posterior deltoid, Infraspinatus and  teres major/minor muscles in R s/l, L scapula mobs into posterior tilt and downward rotation in R s/l   Rationale: decrease pain, increase ROM, increase tissue extensibility and decrease trigger points to improve the patient's ability to reach overhead            With   [x]  TE   []  TA   []  neuro   []  other: Patient Education: [x]  Review HEP    []  Progressed/Changed HEP based on:   []  positioning   []  body mechanics   []  transfers   [x]  heat/ice application    [x]  other: use of towel roll to reduce pressure along L shoulder while driving     Other Objective/Functional Measures: Mild moderate  TTP L middle and posterior deltoid  Moderate TTP infraspinatus and Teres major/minor    AROM L shoulder:  Flexion: 113 degrees  Scaption: 97 degrees    PROM:   Flexion: 122 degrees  Scaption: 115 degrees     Pain Level (0-10 scale) post treatment: not reported /10    ASSESSMENT/Changes in Function:   Decreased TTP and increased P/AROM compared to initial visit and overall decreased pain levels. Tolerated new exercises well today.   Patient will continue to benefit from skilled PT services to modify and progress therapeutic interventions, address functional mobility deficits, address ROM deficits, address strength deficits, analyze and address soft tissue restrictions, analyze and cue movement patterns and instruct in home and community integration to attain remaining goals.     []   See Plan of Care  []   See progress  note/recertification  []   See Discharge Summary         Progress towards goals / Updated goals:  NT    PLAN  [x]   Upgrade activities as tolerated     [x]   Continue plan of care  []   Update interventions per flow sheet       []   Discharge due to:_  [x]   Other:progress HEP next visit      Raenette LOISE Pinal, PTA 06/25/2018

## 2018-06-28 ENCOUNTER — Inpatient Hospital Stay: Admit: 2018-06-28 | Payer: MEDICARE | Attending: Rehabilitative and Restorative Service Providers" | Primary: Family

## 2018-06-28 NOTE — Progress Notes (Signed)
 PT DAILY TREATMENT NOTE - MCR 2-15    Patient Name: Lindsey Huynh  Date:06/28/2018  DOB: May 05, 1951  [x]   Patient DOB Verified  Payor: [place] MEDICARE / Plan: [place] MEDICARE PART A / Product Type: Medicare /    In time: 2:55 PM  Out time:3:55 PM  Total Treatment Time (min): 60  Total Timed Codes (min): 45  1:1 Treatment Time ([facility] only): 45  Visit #:  5    Treatment Area: Left arm pain [M79.602]    SUBJECTIVE  Pain Level (0-10 scale): 5/10  Any medication changes, allergies to medications, adverse drug reactions, diagnosis change, or new procedure performed?: [x]  No    []  Yes (see summary sheet for update)  Subjective functional status/changes:   []  No changes reported  Patient states she was not too sore after last session and her shoulder is doing ok overall.    OBJECTIVE    Modality rationale: decrease pain and increase tissue extensibility to improve the patient's ability to reach overhead   Min Type Additional Details      15 [x]  Estim: [] Att   [x] Unatt    [] TENS instruct                  [x] IFC  [] Premod   [] NMES                     [] Other:  [] w/US    [] w/ice   [x] w/heat  Position: seated with L UE supported  Location: L RTC       []   Traction: []  Cervical       [] Lumbar                       []  Prone          [] Supine                       [] Intermittent   [] Continuous Lbs:  []  before manual  []  after manual  [] w/heat    []   Ultrasound: [] Continuous   []  Pulsed                       at: []   [] Location:  W/cm2:    []  Paraffin         Location:   [] w/heat    []   Ice     [x]   Heat  []   Ice massage Position: supine  Location: L shoulder    []   Laser  []   Other: Position:  Location:      []   Vasopneumatic Device Pressure:       []  lo []  med []  hi   Temperature:      [x]  Skin assessment post-treatment:  [x] intact [] redness- no adverse reaction    [] redness - adverse reaction:     35 min Therapeutic Exercise:  [x]  See flow sheet : added wall slides, supine chin tuck, progressed isometric ER to isometric walkout  using yellow band   Rationale: increase ROM, increase strength and improve coordination to improve the patient's ability to reach overhead    10 min Manual Therapy: L GH joint mobs grade I-II/ oscillations to reduce pain and muscle guarding, PROM L shoulder flexion, abduction, ER and IR, STM/TPR L middle and posterior deltoid, Infraspinatus and teres major/minor muscles in R s/l, L scapula mobs into posterior tilt and downward rotation in R s/l   Rationale: decrease pain, increase ROM, increase  tissue extensibility and decrease trigger points to improve the patient's ability to reach overhead            With   [x]  TE   []  TA   []  neuro   []  other: Patient Education: [x]  Review HEP    []  Progressed/Changed HEP based on:   []  positioning   []  body mechanics   []  transfers   [x]  heat/ice application    [x]  other: use of towel roll to reduce pressure along L shoulder while driving     Other Objective/Functional Measures:     Pain Level (0-10 scale) post treatment: 0/10    ASSESSMENT/Changes in Function:   Pt continues to demonstrate decreased shoulder flexion w/ empty end feel. Pt has high pain levels but feels good relief after manual techinques.  Patient will continue to benefit from skilled PT services to modify and progress therapeutic interventions, address functional mobility deficits, address ROM deficits, address strength deficits, analyze and address soft tissue restrictions, analyze and cue movement patterns and instruct in home and community integration to attain remaining goals.     []   See Plan of Care  []   See progress note/recertification  []   See Discharge Summary         Progress towards goals / Updated goals:  NT    PLAN  [x]   Upgrade activities as tolerated     [x]   Continue plan of care  []   Update interventions per flow sheet       []   Discharge due to:_  []   Other:    Cordella JINNY Smart, PT, DPT, OCS 06/28/2018

## 2018-06-28 NOTE — Progress Notes (Signed)
PT DAILY TREATMENT NOTE - MCR 2-15    Patient Name: Lindsey Huynh  Date:06/28/2018  DOB: 10/17/1950  [x]   Patient DOB Verified  Payor: [place] MEDICARE / Plan: [place] MEDICARE PART A / Product Type: Medicare /    In time: 2:55 PM  Out time:3:55 PM  Total Treatment Time (min): 60  Total Timed Codes (min): 45  1:1 Treatment Time ([place] only): 16  Visit #:  5    Treatment Area: Left arm pain [M79.602]    SUBJECTIVE  Pain Level (0-10 scale): 5/10  Any medication changes, allergies to medications, adverse drug reactions, diagnosis change, or new procedure performed?: [x]  No    []  Yes (see summary sheet for update)  Subjective functional status/changes:   []  No changes reported  Patient states she was not too sore after last session and her shoulder is doing ok overall.    OBJECTIVE    Modality rationale: decrease pain and increase tissue extensibility to improve the patient???s ability to reach overhead   Min Type Additional Details      15 [x]  Estim: [] Att   [x] Unatt    [] TENS instruct                  [x] IFC  [] Premod   [] NMES                     [] Other:  [] w/US   [] w/ice   [x] w/heat  Position: seated with L UE supported  Location: L RTC       []   Traction: []  Cervical       [] Lumbar                       []  Prone          [] Supine                       [] Intermittent   [] Continuous Lbs:  []  before manual  []  after manual  [] w/heat    []   Ultrasound: [] Continuous   []  Pulsed                       at: [] 1MHz   [] 3MHz Location:  W/cm2:    []  Paraffin         Location:   [] w/heat    []   Ice     [x]   Heat  []   Ice massage Position: supine  Location: L shoulder    []   Laser  []   Other: Position:  Location:      []   Vasopneumatic Device Pressure:       []  lo []  med []  hi   Temperature:      [x]  Skin assessment post-treatment:  [x] intact [] redness- no adverse reaction    [] redness ??? adverse reaction:     35 min Therapeutic Exercise:  [x]  See flow sheet : added wall slides,  supine chin tuck, progressed isometric ER to isometric walkout using yellow band   Rationale: increase ROM, increase strength and improve coordination to improve the patient???s ability to reach overhead    10 min Manual Therapy: L GH joint mobs grade I-II/ oscillations to reduce pain and muscle guarding, PROM L shoulder flexion, abduction, ER and IR, STM/TPR L middle and posterior deltoid, Infraspinatus and teres major/minor muscles in R s/l, L scapula mobs into posterior tilt and downward rotation in R s/l   Rationale: decrease pain, increase ROM, increase  tissue extensibility and decrease trigger points to improve the patient???s ability to reach overhead            With   [x]  TE   []  TA   []  neuro   []  other: Patient Education: [x]  Review HEP    []  Progressed/Changed HEP based on:   []  positioning   []  body mechanics   []  transfers   [x]  heat/ice application    [x]  other: use of towel roll to reduce pressure along L shoulder while driving     Other Objective/Functional Measures:     Pain Level (0-10 scale) post treatment: 0/10    ASSESSMENT/Changes in Function:   Pt continues to demonstrate decreased shoulder flexion w/ empty end feel. Pt has high pain levels but feels good relief after manual techinques.  Patient will continue to benefit from skilled PT services to modify and progress therapeutic interventions, address functional mobility deficits, address ROM deficits, address strength deficits, analyze and address soft tissue restrictions, analyze and cue movement patterns and instruct in home and community integration to attain remaining goals.     []   See Plan of Care  []   See progress note/recertification  []   See Discharge Summary         Progress towards goals / Updated goals:  NT    PLAN  [x]   Upgrade activities as tolerated     [x]   Continue plan of care  []   Update interventions per flow sheet       []   Discharge due to:_  []   Other:    Willette Pa, PT, DPT, OCS 06/28/2018

## 2018-07-02 ENCOUNTER — Inpatient Hospital Stay: Admit: 2018-07-02 | Payer: MEDICARE | Attending: Rehabilitative and Restorative Service Providers" | Primary: Family

## 2018-07-02 NOTE — Progress Notes (Signed)
PT DAILY TREATMENT NOTE - MCR 2-15    Patient Name: Lindsey Huynh  Date:07/02/2018  DOB: 1951/09/17  [x]   Patient DOB Verified  Payor: [place] MEDICARE / Plan: [place] MEDICARE PART A / Product Type: Medicare /    In time: 9:30 AM  Out time:10:35 AM  Total Treatment Time (min): 65  Total Timed Codes (min): 50  1:1 Treatment Time ([facility] only): 30  Visit #:  6    Treatment Area: Left arm pain [M79.602]    SUBJECTIVE  Pain Level (0-10 scale): 4/10  Any medication changes, allergies to medications, adverse drug reactions, diagnosis change, or new procedure performed?: [x]  No    []  Yes (see summary sheet for update)  Subjective functional status/changes:   []  No changes reported  Patient states she is trying to more things at home using her L arm. States she continues to have L shoulder pain if she reaches too high.    OBJECTIVE    Modality rationale: decrease pain and increase tissue extensibility to improve the patient???s ability to reach overhead   Min Type Additional Details      15 [x]  Estim: [] Att   [x] Unatt    [] TENS instruct                  [x] IFC  [] Premod   [] NMES                     [] Other:  [] w/US   [] w/ice   [x] w/heat  Position: seated with L UE supported  Location: L RTC       []   Traction: []  Cervical       [] Lumbar                       []  Prone          [] Supine                       [] Intermittent   [] Continuous Lbs:  []  before manual  []  after manual  [] w/heat    []   Ultrasound: [] Continuous   []  Pulsed                       at: [] 1MHz   [] 3MHz Location:  W/cm2:    []  Paraffin         Location:   [] w/heat    []   Ice     [x]   Heat  []   Ice massage Position: supine  Location: L shoulder    []   Laser  []   Other: Position:  Location:      []   Vasopneumatic Device Pressure:       []  lo []  med []  hi   Temperature:      [x]  Skin assessment post-treatment:  [x] intact [] redness- no adverse reaction    [] redness ??? adverse reaction:     35 min Therapeutic Exercise:  [x]  See flow sheet : added wall slides,  supine chin tuck, progressed isometric ER to isometric walkout using yellow band   Rationale: increase ROM, increase strength and improve coordination to improve the patient???s ability to reach overhead    15 min Manual Therapy: L GH joint mobs grade I-II/ oscillations to reduce pain and muscle guarding, PROM L shoulder flexion, abduction, ER and IR, STM/TPR L middle and posterior deltoid, Infraspinatus and teres major/minor muscles in R s/l, L scapula mobs into posterior tilt and downward rotation in  R s/l   Rationale: decrease pain, increase ROM, increase tissue extensibility and decrease trigger points to improve the patient???s ability to reach overhead            With   [x]  TE   []  TA   []  neuro   []  other: Patient Education: [x]  Review HEP    []  Progressed/Changed HEP based on:   []  positioning   []  body mechanics   []  transfers   [x]  heat/ice application    [x]  other: use of towel roll to reduce pressure along L shoulder while driving     Other Objective/Functional Measures:     Pain Level (0-10 scale) post treatment: 0/10    ASSESSMENT/Changes in Function:   ROM steadily improving, fatigue with isometric shoulder ER walkout.    Patient will continue to benefit from skilled PT services to modify and progress therapeutic interventions, address functional mobility deficits, address ROM deficits, address strength deficits, analyze and address soft tissue restrictions, analyze and cue movement patterns and instruct in home and community integration to attain remaining goals.     []   See Plan of Care  []   See progress note/recertification  []   See Discharge Summary         Progress towards goals / Updated goals:  NT    PLAN  [x]   Upgrade activities as tolerated     [x]   Continue plan of care  []   Update interventions per flow sheet       []   Discharge due to:_  []   Other:    Erick Colace, PTA 07/02/2018

## 2018-07-02 NOTE — Progress Notes (Signed)
 PT DAILY TREATMENT NOTE - MCR 2-15    Patient Name: Lindsey Huynh  Date:07/02/2018  DOB: 02/18/1951  [x]   Patient DOB Verified  [place]or: [place] MED[place]ARE / Plan: [place] MEDICARE PART A / Product Type: Medicare /    In time: 9:30 AM  Out time:10:35 AM  Total Treatment Time (min): 65  Total Timed Codes (min): 50  1:1 Treatment Time ([facility] only): 30  Visit #:  6    Treatment Area: Left arm pain [M79.602]    SUBJECTIVE  Pain Level (0-10 scale): 4/10  Any medication changes, allergies to medications, adverse drug reactions, diagnosis change, or new procedure performed?: [x]  No    []  Yes (see summary sheet for update)  Subjective functional status/changes:   []  No changes reported  Patient states she is trying to more things at home using her L arm. States she continues to have L shoulder pain if she reaches too high.    OBJECTIVE    Modality rationale: decrease pain and increase tissue extensibility to improve the patient's ability to reach overhead   Min Type Additional Details      15 [x]  Estim: [] Att   [x] Unatt    [] TENS instruct                  [x] IFC  [] Premod   [] NMES                     [] Other:  [] w/US    [] w/ice   [x] w/heat  Position: seated with L UE supported  Location: L RTC       []   Traction: []  Cervical       [] Lumbar                       []  Prone          [] Supine                       [] Intermittent   [] Continuous Lbs:  []  before manual  []  after manual  [] w/heat    []   Ultrasound: [] Continuous   []  Pulsed                       at: []   [] Location:  W/cm2:    []  Paraffin         Location:   [] w/heat    []   Ice     [x]   Heat  []   Ice massage Position: supine  Location: L shoulder    []   Laser  []   Other: Position:  Location:      []   Vasopneumatic Device Pressure:       []  lo []  med []  hi   Temperature:      [x]  Skin assessment post-treatment:  [x] intact [] redness- no adverse reaction    [] redness - adverse reaction:     35 min Therapeutic Exercise:  [x]  See flow sheet : added wall slides, supine chin  tuck, progressed isometric ER to isometric walkout using yellow band   Rationale: increase ROM, increase strength and improve coordination to improve the patient's ability to reach overhead    15 min Manual Therapy: L GH joint mobs grade I-II/ oscillations to reduce pain and muscle guarding, PROM L shoulder flexion, abduction, ER and IR, STM/TPR L middle and posterior deltoid, Infraspinatus and teres major/minor muscles in R s/l, L scapula mobs into posterior tilt and downward rotation in  R s/l   Rationale: decrease pain, increase ROM, increase tissue extensibility and decrease trigger points to improve the patient's ability to reach overhead            With   [x]  TE   []  TA   []  neuro   []  other: Patient Education: [x]  Review HEP    []  Progressed/Changed HEP based on:   []  positioning   []  body mechanics   []  transfers   [x]  heat/ice application    [x]  other: use of towel roll to reduce pressure along L shoulder while driving     Other Objective/Functional Measures:     Pain Level (0-10 scale) post treatment: 0/10    ASSESSMENT/Changes in Function:   ROM steadily improving, fatigue with isometric shoulder ER walkout.    Patient will continue to benefit from skilled PT services to modify and progress therapeutic interventions, address functional mobility deficits, address ROM deficits, address strength deficits, analyze and address soft tissue restrictions, analyze and cue movement patterns and instruct in home and community integration to attain remaining goals.     []   See Plan of Care  []   See progress note/recertification  []   See Discharge Summary         Progress towards goals / Updated goals:  NT    PLAN  [x]   Upgrade activities as tolerated     [x]   Continue plan of care  []   Update interventions per flow sheet       []   Discharge due to:_  []   Other:    Raenette LOISE Pinal, PTA 07/02/2018

## 2018-07-05 ENCOUNTER — Inpatient Hospital Stay: Admit: 2018-07-05 | Payer: MEDICARE | Attending: Rehabilitative and Restorative Service Providers" | Primary: Family

## 2018-07-05 NOTE — Progress Notes (Signed)
PT DAILY TREATMENT NOTE - MCR 2-15    Patient Name: Lindsey Huynh  Date:07/05/2018  DOB: 04-11-1951  [x]   Patient DOB Verified  Payor: [place] MEDICARE / Plan: [place] MEDICARE PART A & B / Product Type: Medicare /    In time: 3:00 PM  Out time:3:55 PM  Total Treatment Time (min): 55  Total Timed Codes (min): 45  1:1 Treatment Time ([facility] only): 37  Visit #:  7    Treatment Area: Left arm pain [M79.602]    SUBJECTIVE  Pain Level (0-10 scale): 4/10  Any medication changes, allergies to medications, adverse drug reactions, diagnosis change, or new procedure performed?: [x]  No    []  Yes (see summary sheet for update)  Subjective functional status/changes:   []  No changes reported  Patient states she is doing ok but having some more L sided neck pain.  OBJECTIVE    Modality rationale: decrease pain and increase tissue extensibility to improve the patient???s ability to reach overhead   Min Type Additional Details      15 [x]  Estim: [] Att   [x] Unatt    [] TENS instruct                  [x] IFC  [] Premod   [] NMES                     [] Other:  [] w/US   [] w/ice   [x] w/heat  Position: seated with L UE supported  Location: L RTC       []   Traction: []  Cervical       [] Lumbar                       []  Prone          [] Supine                       [] Intermittent   [] Continuous Lbs:  []  before manual  []  after manual  [] w/heat    []   Ultrasound: [] Continuous   []  Pulsed                       at: [] 1MHz   [] 3MHz Location:  W/cm2:    []  Paraffin         Location:   [] w/heat    []   Ice     [x]   Heat  []   Ice massage Position: supine  Location: L shoulder    []   Laser  []   Other: Position:  Location:      []   Vasopneumatic Device Pressure:       []  lo []  med []  hi   Temperature:      [x]  Skin assessment post-treatment:  [x] intact [] redness- no adverse reaction    [] redness ??? adverse reaction:     30 min Therapeutic Exercise:  [x]  See flow sheet : added wall slides, supine chin tuck, progressed isometric ER to isometric walkout using yellow band    Rationale: increase ROM, increase strength and improve coordination to improve the patient???s ability to reach overhead    15 min Manual Therapy: L GH joint mobs grade I-II/ oscillations to reduce pain and muscle guarding, PROM L shoulder flexion, abduction, ER and IR, STM/TPR L middle and posterior deltoid, Infraspinatus and teres major/minor muscles in R s/l, STM to L upper trap and levator scap   Rationale: decrease pain, increase ROM, increase tissue extensibility and decrease trigger points to  improve the patient???s ability to reach overhead            With   [x]  TE   []  TA   []  neuro   []  other: Patient Education: [x]  Review HEP    []  Progressed/Changed HEP based on:   []  positioning   []  body mechanics   []  transfers   [x]  heat/ice application    []  other:      Other Objective/Functional Measures:     Pain Level (0-10 scale) post treatment: 0/10    ASSESSMENT/Changes in Function:   Pt continues to demonstrate improved shoulder flexion PROM. Pt was TTP over L cervical paraspinals.    Patient will continue to benefit from skilled PT services to modify and progress therapeutic interventions, address functional mobility deficits, address ROM deficits, address strength deficits, analyze and address soft tissue restrictions, analyze and cue movement patterns and instruct in home and community integration to attain remaining goals.     []   See Plan of Care  []   See progress note/recertification  []   See Discharge Summary         Progress towards goals / Updated goals:  NT    PLAN  [x]   Upgrade activities as tolerated     [x]   Continue plan of care  []   Update interventions per flow sheet       []   Discharge due to:_  []   Other:    Willette Pa, PT, DPT, OCS 07/05/2018

## 2018-07-05 NOTE — Progress Notes (Signed)
 PT DAILY TREATMENT NOTE - MCR 2-15    Patient Name: Lindsey Huynh  Date:07/05/2018  DOB: 12-20-1950  [x]   Patient DOB Verified  Payor: [place] MEDICARE / Plan: [place] MEDICARE PART A & B / Product Type: Medicare /    In time: 3:00 PM  Out time:3:55 PM  Total Treatment Time (min): 55  Total Timed Codes (min): 45  1:1 Treatment Time ([facility] only): 45  Visit #:  7    Treatment Area: Left arm pain [M79.602]    SUBJECTIVE  Pain Level (0-10 scale): 4/10  Any medication changes, allergies to medications, adverse drug reactions, diagnosis change, or new procedure performed?: [x]  No    []  Yes (see summary sheet for update)  Subjective functional status/changes:   []  No changes reported  Patient states she is doing ok but having some more L sided neck pain.  OBJECTIVE    Modality rationale: decrease pain and increase tissue extensibility to improve the patient's ability to reach overhead   Min Type Additional Details      15 [x]  Estim: [] Att   [x] Unatt    [] TENS instruct                  [x] IFC  [] Premod   [] NMES                     [] Other:  [] w/US    [] w/ice   [x] w/heat  Position: seated with L UE supported  Location: L RTC       []   Traction: []  Cervical       [] Lumbar                       []  Prone          [] Supine                       [] Intermittent   [] Continuous Lbs:  []  before manual  []  after manual  [] w/heat    []   Ultrasound: [] Continuous   []  Pulsed                       at: []   [] Location:  W/cm2:    []  Paraffin         Location:   [] w/heat    []   Ice     [x]   Heat  []   Ice massage Position: supine  Location: L shoulder    []   Laser  []   Other: Position:  Location:      []   Vasopneumatic Device Pressure:       []  lo []  med []  hi   Temperature:      [x]  Skin assessment post-treatment:  [x] intact [] redness- no adverse reaction    [] redness - adverse reaction:     30 min Therapeutic Exercise:  [x]  See flow sheet : added wall slides, supine chin tuck, progressed isometric ER to isometric walkout using yellow band    Rationale: increase ROM, increase strength and improve coordination to improve the patient's ability to reach overhead    15 min Manual Therapy: L GH joint mobs grade I-II/ oscillations to reduce pain and muscle guarding, PROM L shoulder flexion, abduction, ER and IR, STM/TPR L middle and posterior deltoid, Infraspinatus and teres major/minor muscles in R s/l, STM to L upper trap and levator scap   Rationale: decrease pain, increase ROM, increase tissue extensibility and decrease trigger points to  improve the patient's ability to reach overhead            With   [x]  TE   []  TA   []  neuro   []  other: Patient Education: [x]  Review HEP    []  Progressed/Changed HEP based on:   []  positioning   []  body mechanics   []  transfers   [x]  heat/ice application    []  other:      Other Objective/Functional Measures:     Pain Level (0-10 scale) post treatment: 0/10    ASSESSMENT/Changes in Function:   Pt continues to demonstrate improved shoulder flexion PROM. Pt was TTP over L cervical paraspinals.    Patient will continue to benefit from skilled PT services to modify and progress therapeutic interventions, address functional mobility deficits, address ROM deficits, address strength deficits, analyze and address soft tissue restrictions, analyze and cue movement patterns and instruct in home and community integration to attain remaining goals.     []   See Plan of Care  []   See progress note/recertification  []   See Discharge Summary         Progress towards goals / Updated goals:  NT    PLAN  [x]   Upgrade activities as tolerated     [x]   Continue plan of care  []   Update interventions per flow sheet       []   Discharge due to:_  []   Other:    Gregory J Mitro, PT, DPT, OCS 07/05/2018

## 2018-07-09 ENCOUNTER — Inpatient Hospital Stay: Admit: 2018-07-09 | Payer: MEDICARE | Attending: Rehabilitative and Restorative Service Providers" | Primary: Family

## 2018-07-09 DIAGNOSIS — J301 Allergic rhinitis due to pollen: Secondary | ICD-10-CM | POA: Diagnosis not present

## 2018-07-09 DIAGNOSIS — J3089 Other allergic rhinitis: Secondary | ICD-10-CM | POA: Diagnosis not present

## 2018-07-09 NOTE — Progress Notes (Signed)
 PT DAILY TREATMENT NOTE - MCR 2-15    Patient Name: Lindsey Huynh  Date:07/09/2018  DOB: 1951-04-26  [x]   Patient DOB Verified  Payor: [place] MEDICARE / Plan: [place] MEDICARE PART A & B / Product Type: Medicare /    In time: 930 AM  Out time: 1040   Total Treatment Time (min): 70  Total Timed Codes (min): 55  1:1 Treatment Time [facility] only):55   Visit #:  8    Treatment Area: Left arm pain [M79.602]    SUBJECTIVE  Pain Level (0-10 scale): 5/10.  Pt reports the standing isometric walk outs are always painful.    Any medication changes, allergies to medications, adverse drug reactions, diagnosis change, or new procedure performed?: [x]  No    []  Yes (see summary sheet for update)  Subjective functional status/changes:   []  No changes reported  See above.    OBJECTIVE    Modality rationale: decrease pain and increase tissue extensibility to improve the patient's ability to reach overhead   Min Type Additional Details      15 [x]  Estim: [] Att   [x] Unatt    [] TENS instruct                  [x] IFC  [] Premod   [] NMES                     [] Other:  [] w/US    [] w/ice   [x] w/heat  Position: seated with L UE supported  Location: L RTC       []   Traction: []  Cervical       [] Lumbar                       []  Prone          [] Supine                       [] Intermittent   [] Continuous Lbs:  []  before manual  []  after manual  [] w/heat    []   Ultrasound: [] Continuous   []  Pulsed                       at: []   [] Location:  W/cm2:    []  Paraffin         Location:   [] w/heat    []   Ice     [x]   Heat  []   Ice massage Position: supine  Location: L shoulder    []   Laser  []   Other: Position:  Location:      []   Vasopneumatic Device Pressure:       []  lo []  med []  hi   Temperature:      [x]  Skin assessment post-treatment:  [x] intact [] redness- no adverse reaction    [] redness - adverse reaction:     35 min Therapeutic Exercise:  [x]  See flow sheet :     Rationale: increase ROM, increase strength and improve coordination to improve the  patient's ability to reach overhead    20 min Manual Therapy: PROM L shoulder flexion, abduction, ER and IR,  STM to L upper trap and levator scap   Rationale: decrease pain, increase ROM, increase tissue extensibility and decrease trigger points to improve the patient's ability to reach overhead            With   [x]  TE   []  TA   []  neuro   []  other:  Patient Education: [x]  Review HEP    []  Progressed/Changed HEP based on:   []  positioning   []  body mechanics   []  transfers   [x]  heat/ice application    []  other:      Other Objective/Functional Measures:  Pt with poor form with ther. Ex. At times, needing cueing from PT for correct form.      Pain Level (0-10 scale) post treatment: pain rating was not captured.     ASSESSMENT/Changes in Function:   Pt with increased cueing needed for majority of therapeutic exercises by PT.  Pt with improved form with cueing from PT.  Pt only able to tolerate very light STM to left upper trapezius muscle and frequently guarding with shoulder PROM as well.      Patient will continue to benefit from skilled PT services to modify and progress therapeutic interventions, address functional mobility deficits, address ROM deficits, address strength deficits, analyze and address soft tissue restrictions, analyze and cue movement patterns and instruct in home and community integration to attain remaining goals.     []   See Plan of Care  []   See progress note/recertification  []   See Discharge Summary         Progress towards goals / Updated goals:  NT    PLAN  [x]   Upgrade activities as tolerated     [x]   Continue plan of care  []   Update interventions per flow sheet       []   Discharge due to:_  []   Other:    Delon LITTIE Lee, PT, DPT, OCS 07/09/2018

## 2018-07-09 NOTE — Progress Notes (Signed)
PT DAILY TREATMENT NOTE - MCR 2-15    Patient Name: Lindsey Huynh  Date:07/09/2018  DOB: 18-May-1951  [x]   Patient DOB Verified  Payor: [place] MEDICARE / Plan: [place] MEDICARE PART A & B / Product Type: Medicare /    In time: 930 AM  Out time: 1040   Total Treatment Time (min): 70  Total Timed Codes (min): 55  1:1 Treatment Time [facility] only):55   Visit #:  8    Treatment Area: Left arm pain [M79.602]    SUBJECTIVE  Pain Level (0-10 scale): 5/10.  Pt reports the standing isometric walk outs are always painful.    Any medication changes, allergies to medications, adverse drug reactions, diagnosis change, or new procedure performed?: [x]  No    []  Yes (see summary sheet for update)  Subjective functional status/changes:   []  No changes reported  See above.    OBJECTIVE    Modality rationale: decrease pain and increase tissue extensibility to improve the patient???s ability to reach overhead   Min Type Additional Details      15 [x]  Estim: [] Att   [x] Unatt    [] TENS instruct                  [x] IFC  [] Premod   [] NMES                     [] Other:  [] w/US   [] w/ice   [x] w/heat  Position: seated with L UE supported  Location: L RTC       []   Traction: []  Cervical       [] Lumbar                       []  Prone          [] Supine                       [] Intermittent   [] Continuous Lbs:  []  before manual  []  after manual  [] w/heat    []   Ultrasound: [] Continuous   []  Pulsed                       at: [] 1MHz   [] 3MHz Location:  W/cm2:    []  Paraffin         Location:   [] w/heat    []   Ice     [x]   Heat  []   Ice massage Position: supine  Location: L shoulder    []   Laser  []   Other: Position:  Location:      []   Vasopneumatic Device Pressure:       []  lo []  med []  hi   Temperature:      [x]  Skin assessment post-treatment:  [x] intact [] redness- no adverse reaction    [] redness ??? adverse reaction:     35 min Therapeutic Exercise:  [x]  See flow sheet :     Rationale: increase ROM, increase strength and improve coordination to  improve the patient???s ability to reach overhead    20 min Manual Therapy: PROM L shoulder flexion, abduction, ER and IR,  STM to L upper trap and levator scap   Rationale: decrease pain, increase ROM, increase tissue extensibility and decrease trigger points to improve the patient???s ability to reach overhead            With   [x]  TE   []  TA   []  neuro   []  other:  Patient Education: [x]  Review HEP    []  Progressed/Changed HEP based on:   []  positioning   []  body mechanics   []  transfers   [x]  heat/ice application    []  other:      Other Objective/Functional Measures:  Pt with poor form with ther. Ex. At times, needing cueing from PT for correct form.      Pain Level (0-10 scale) post treatment: pain rating was not captured.     ASSESSMENT/Changes in Function:   Pt with increased cueing needed for majority of therapeutic exercises by PT.  Pt with improved form with cueing from PT.  Pt only able to tolerate very light STM to left upper trapezius muscle and frequently guarding with shoulder PROM as well.      Patient will continue to benefit from skilled PT services to modify and progress therapeutic interventions, address functional mobility deficits, address ROM deficits, address strength deficits, analyze and address soft tissue restrictions, analyze and cue movement patterns and instruct in home and community integration to attain remaining goals.     []   See Plan of Care  []   See progress note/recertification  []   See Discharge Summary         Progress towards goals / Updated goals:  NT    PLAN  [x]   Upgrade activities as tolerated     [x]   Continue plan of care  []   Update interventions per flow sheet       []   Discharge due to:_  []   Other:    Kallie Locks, PT, DPT, OCS 07/09/2018

## 2018-07-12 ENCOUNTER — Encounter: Payer: MEDICARE | Attending: Rehabilitative and Restorative Service Providers" | Primary: Family

## 2018-07-16 ENCOUNTER — Inpatient Hospital Stay: Admit: 2018-07-16 | Payer: MEDICARE | Attending: Rehabilitative and Restorative Service Providers" | Primary: Family

## 2018-07-16 NOTE — Progress Notes (Signed)
PT DAILY TREATMENT NOTE - MCR 2-15    Patient Name: Lindsey Huynh  Date:07/16/2018  DOB: 06/17/1951  [x]   Patient DOB Verified  Payor: [place] MEDICARE / Plan: [place] MEDICARE PART A & B / Product Type: Medicare /    In time: 930 AM  Out time: 1030   Total Treatment Time (min): 60  Total Timed Codes (min): 45  1:1 Treatment Time [facility] only):45   Visit #:  9    Treatment Area: Left arm pain [M79.602]    SUBJECTIVE  Pain Level (0-10 scale): 5/10.      Any medication changes, allergies to medications, adverse drug reactions, diagnosis change, or new procedure performed?: [x]  No    []  Yes (see summary sheet for update)  Subjective functional status/changes:   []  No changes report  Patient states her L shoulder feels about the same, she feels better after manual therapy and feels her motion has somewhat improved however she continues to have most pain with L shoulder ER or when the seat belt rests on her L shoulder. States her daughter has been ill so she is very worried about her. She would like for next visit to be d/c to HEP.     OBJECTIVE    Modality rationale: decrease pain and increase tissue extensibility to improve the patient's ability to reach overhead   Min Type Additional Details      15 [x]  Estim: [] Att   [x] Unatt    [] TENS instruct                  [x] IFC  [] Premod   [] NMES                     [] Other:  [] w/US   [] w/ice   [x] w/heat  Position: seated with L UE supported  Location: L RTC       []   Traction: []  Cervical       [] Lumbar                       []  Prone          [] Supine                       [] Intermittent   [] Continuous Lbs:  []  before manual  []  after manual  [] w/heat    []   Ultrasound: [] Continuous   []  Pulsed                       at: []   [] Location:  W/cm2:    []  Paraffin         Location:   [] w/heat    []   Ice     [x]   Heat  []   Ice massage Position: supine  Location: L shoulder    []   Laser  []   Other: Position:  Location:      []   Vasopneumatic Device Pressure:       []  lo []  med []  hi    Temperature:      [x]  Skin assessment post-treatment:  [x] intact [] redness- no adverse reaction    [] redness - adverse reaction:     30 min Therapeutic Exercise:  [x]  See flow sheet :     Rationale: increase ROM, increase strength and improve coordination to improve the patient's ability to reach overhead    15 min Manual Therapy: PROM L shoulder flexion, abduction, ER and IR,  STM  to L upper trap and levator scap   Rationale: decrease pain, increase ROM, increase tissue extensibility and decrease trigger points to improve the patient's ability to reach overhead            With   [x]  TE   []  TA   []  neuro   []  other: Patient Education: [x]  Review HEP    []  Progressed/Changed HEP based on:   []  positioning   []  body mechanics   []  transfers   [x]  heat/ice application    []  other:      Other Objective/Functional Measures:  Increased muscle banding/turgor L UT and levator scapula region    Pain Level (0-10 scale) post treatment: pain rating was not captured.     ASSESSMENT/Changes in Function:   Increased TTP along L UT and levator scapula. Patient with continued moderate pain levels despite manual therapy and continues to have pain with L shoulder ER. Patient requesting to be d/c next visit to HEP secondary to stressors at home. Patient is to f/u with MD on 07/24/18.    Patient will continue to benefit from skilled PT services to modify and progress therapeutic interventions, address functional mobility deficits, address ROM deficits, address strength deficits, analyze and address soft tissue restrictions, analyze and cue movement patterns and instruct in home and community integration to attain remaining goals.     []   See Plan of Care  []   See progress note/recertification  []   See Discharge Summary         Progress towards goals / Updated goals:  NT    PLAN  [x]   Upgrade activities as tolerated     [x]   Continue plan of care  []   Update interventions per flow sheet       []   Discharge due to:_  [x]   Other:  Reassessment next visit/ d/c to HEP.    Lianne Cure, PTA 07/16/2018
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
## 2018-07-19 ENCOUNTER — Inpatient Hospital Stay: Admit: 2018-07-19 | Payer: MEDICARE | Attending: Rehabilitative and Restorative Service Providers" | Primary: Family

## 2018-07-19 NOTE — Progress Notes (Signed)
PT DAILY TREATMENT NOTE - MCR 2-15    Patient Name: Lindsey Huynh  Date:07/19/2018  DOB: Feb 03, 1951  [x]   Patient DOB Verified  Payor: [place] MEDICARE / Plan: [place] MEDICARE PART A & B / Product Type: Medicare /    In time: 3:00 PM  Out time: 3:55 PM   Total Treatment Time (min): 55  Total Timed Codes (min): 45  1:1 Treatment Time [facility] only):45   Visit #:  10    Treatment Area: Left arm pain [M79.602]    SUBJECTIVE  Pain Level (0-10 scale): 4/10      Any medication changes, allergies to medications, adverse drug reactions, diagnosis change, or new procedure performed?: [x]  No    []  Yes (see summary sheet for update)  Subjective functional status/changes:   []  No changes report  Patient states she is still having a good amount of pain but it is manageable. Pt's daughter has health issues and pt has to be there to take care of her.    OBJECTIVE    Modality rationale: decrease pain and increase tissue extensibility to improve the patient???s ability to reach overhead   Min Type Additional Details      10 [x]  Estim: [] Att   [x] Unatt    [] TENS instruct                  [x] IFC  [] Premod   [] NMES                     [] Other:  [] w/US   [] w/ice   [x] w/heat  Position: seated with L UE supported  Location: L RTC       []   Traction: []  Cervical       [] Lumbar                       []  Prone          [] Supine                       [] Intermittent   [] Continuous Lbs:  []  before manual  []  after manual  [] w/heat    []   Ultrasound: [] Continuous   []  Pulsed                       at: [] 1MHz   [] 3MHz Location:  W/cm2:    []  Paraffin         Location:   [] w/heat    []   Ice     [x]   Heat  []   Ice massage Position: supine  Location: L shoulder    []   Laser  []   Other: Position:  Location:      []   Vasopneumatic Device Pressure:       []  lo []  med []  hi   Temperature:      [x]  Skin assessment post-treatment:  [x] intact [] redness- no adverse reaction    [] redness ??? adverse reaction:     30 min Therapeutic Exercise:  [x]  See flow sheet :      Rationale: increase ROM, increase strength and improve coordination to improve the patient???s ability to reach overhead    15 min Manual Therapy: PROM L shoulder flexion, abduction, ER and IR,  STM to L upper trap and levator scap   Rationale: decrease pain, increase ROM, increase tissue extensibility and decrease trigger points to improve the patient???s ability to reach overhead  With   [x]  TE   []  TA   []  neuro   []  other: Patient Education: [x]  Review HEP    []  Progressed/Changed HEP based on:   []  positioning   []  body mechanics   []  transfers   [x]  heat/ice application    []  other:      Other Objective/Functional Measures:    L shoulder flexion 120 deg  L shoulder ER 75 deg    MMT  Flexion 3+/5  Abduction 3+/5  Biceps 4/5    Pain Level (0-10 scale) post treatment: 3    ASSESSMENT/Changes in Function:   Pt made minimal progress w/ PT but seemed to find some relief w/ stretches and manual techniques. Pt continues to have empty end feel in all directions due to pain. Pt was given updated HEP and educated on contacting MD if symptoms continue. Pt will be d/c'd from PT at this time.     []   See Plan of Care  []   See progress note/recertification  [x]   See Discharge Summary         Progress towards goals / Updated goals:  See d/c    PLAN  []   Upgrade activities as tolerated     []   Continue plan of care  []   Update interventions per flow sheet       [x]   Discharge due to:_daughter's health problems  []   Other:    Willette Pa, PT, DPT, OCS 07/19/2018
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
## 2018-07-19 NOTE — Progress Notes (Signed)
 PT DAILY TREATMENT NOTE - MCR 2-15    Patient Name: Lindsey Huynh  Date:07/19/2018  DOB: [birth year]  [x]   Patient DOB Verified  Payor: VA MEDICARE / Plan: VA MEDICARE PART A & B / Product Type: Medicare /    In time: 3:00 PM  Out time: 3:55 PM   Total Treatment Time (min): 55  Total Timed Codes (min): 45  1:1 Treatment Time [facility] only):45   Visit #:  10    Treatment Area: Left arm pain [M79.602]    SUBJECTIVE  Pain Level (0-10 scale): 4/10      Any medication changes, allergies to medications, adverse drug reactions, diagnosis change, or new procedure performed?: [x]  No    []  Yes (see summary sheet for update)  Subjective functional status/changes:   []  No changes report  Patient states she is still having a good amount of pain but it is manageable. Pt's daughter has health issues and pt has to be there to take care of her.    OBJECTIVE    Modality rationale: decrease pain and increase tissue extensibility to improve the patient's ability to reach overhead   Min Type Additional Details      10 [x]  Estim: [] Att   [x] Unatt    [] TENS instruct                  [x] IFC  [] Premod   [] NMES                     [] Other:  [] w/US    [] w/ice   [x] w/heat  Position: seated with L UE supported  Location: L RTC       []   Traction: []  Cervical       [] Lumbar                       []  Prone          [] Supine                       [] Intermittent   [] Continuous Lbs:  []  before manual  []  after manual  [] w/heat    []   Ultrasound: [] Continuous   []  Pulsed                       at: []   [] Location:  W/cm2:    []  Paraffin         Location:   [] w/heat    []   Ice     [x]   Heat  []   Ice massage Position: supine  Location: L shoulder    []   Laser  []   Other: Position:  Location:      []   Vasopneumatic Device Pressure:       []  lo []  med []  hi   Temperature:      [x]  Skin assessment post-treatment:  [x] intact [] redness- no adverse reaction    [] redness - adverse reaction:     30 min Therapeutic Exercise:  [x]  See flow sheet :      Rationale: increase ROM, increase strength and improve coordination to improve the patient's ability to reach overhead    15 min Manual Therapy: PROM L shoulder flexion, abduction, ER and IR,  STM to L upper trap and levator scap   Rationale: decrease pain, increase ROM, increase tissue extensibility and decrease trigger points to improve the patient's ability to reach overhead  With   [x]  TE   []  TA   []  neuro   []  other: Patient Education: [x]  Review HEP    []  Progressed/Changed HEP based on:   []  positioning   []  body mechanics   []  transfers   [x]  heat/ice application    []  other:      Other Objective/Functional Measures:    L shoulder flexion 120 deg  L shoulder ER 75 deg    MMT  Flexion 3+/5  Abduction 3+/5  Biceps 4/5    Pain Level (0-10 scale) post treatment: 3    ASSESSMENT/Changes in Function:   Pt made minimal progress w/ PT but seemed to find some relief w/ stretches and manual techniques. Pt continues to have empty end feel in all directions due to pain. Pt was given updated HEP and educated on contacting MD if symptoms continue. Pt will be d/c'd from PT at this time.     []   See Plan of Care  []   See progress note/recertification  [x]   See Discharge Summary         Progress towards goals / Updated goals:  See d/c    PLAN  []   Upgrade activities as tolerated     []   Continue plan of care  []   Update interventions per flow sheet       [x]   Discharge due to:_daughter's health problems  []   Other:    Gregory J Mitro, PT, DPT, OCS 07/19/2018
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
## 2018-07-23 DIAGNOSIS — J301 Allergic rhinitis due to pollen: Secondary | ICD-10-CM | POA: Diagnosis not present

## 2018-07-23 DIAGNOSIS — J3089 Other allergic rhinitis: Secondary | ICD-10-CM | POA: Diagnosis not present

## 2018-07-31 NOTE — Progress Notes (Signed)
67 y.o. G0P0000 Married White or Caucasian Not Hispanic or Latino female here for annual exam. No vaginal bleeding.  Wants to talk about an alternative for Premarin due to cost. The premarin is working well for her vaginal dryness. Not sexually active. No bowel issues. She has some urgency to void, some urge incontinence. Mostly tolerable, occurs up to a couple of times a week. No GSI.     Patient's last menstrual period was 09/04/2002 (exact date).          Sexually active: No.  The current method of family planning is post menopausal status.    Exercising: Yes.    yoga 3 times a week, weights Smoker:  no  Health Maintenance: Pap:  07-13-16 WNL  History of abnormal Pap:  no MMG:  08/31/2017 Birads 1 negative Colonoscopy:  11/07/2017 polyp, diverticulosis, f/u in 5 years.  BMD:  08/31/2017 osteopenia, T score of -1.7, FRAX 15.6%/1.9% TDaP:  02-23-10 Gardasil: N/A   reports that she has never smoked. She has never used smokeless tobacco. She reports that she does not drink alcohol or use drugs. Retired Catering manager, husband is retired Insurance underwriter.   Past Medical History:  Diagnosis Date  . Allergy   . Anxiety   . Asthma   . Cataract 2018   removed  . DDD (degenerative disc disease)   . Depression   . GERD (gastroesophageal reflux disease)   . Hemorrhoids   . Hyperlipidemia   . Vitamin D deficiency     Past Surgical History:  Procedure Laterality Date  . 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 cataract surgery    . FOOT NEUROMA SURGERY     multiple times bilaterally  . PLANTAR FASCIA RELEASE      Current Outpatient Medications  Medication Sig Dispense Refill  . aspirin 81 MG tablet Take 81 mg by mouth daily.      Marland Kitchen conjugated estrogens (PREMARIN) vaginal cream Place 0.5 grams intravaginally 2 x a week at bedtime 30 g 0  . [employer] FIBER GUMMIES PO Take 2 tablets by mouth.    . dexlansoprazole (DEXILANT) 60 MG capsule Take one tablet twice daily. 60 capsule 1  . DYMISTA 137-50  MCG/ACT SUSP Place 2 sprays into both nostrils as needed.    Marland Kitchen levocetirizine (XYZAL) 5 MG tablet Take 1 tablet by mouth as needed.    . Melatonin 5 MG CHEW Chew by mouth at bedtime.    . meloxicam (MOBIC) 15 MG tablet Take 1 tablet by mouth daily as needed.    . Multiple Vitamins-Minerals (MULTIVITAMIN WITH MINERALS) tablet Take 1 tablet by mouth daily.      . ORACEA 40 MG capsule Take 1 tablet by mouth Daily.    Marland Kitchen PARoxetine (PAXIL) 40 MG tablet Take 1 tablet by mouth daily.    . pravastatin (PRAVACHOL) 40 MG tablet Take 1 tablet by mouth Daily.    Marland Kitchen SINGULAIR 10 MG tablet Take 1 tablet by mouth Daily.    . Vitamin D, Ergocalciferol, (DRISDOL) 50000 UNITS CAPS Take 1 tablet by mouth Once a week.     Current Facility-Administered Medications  Medication Dose Route Frequency Provider Last Rate Last Dose  . 0.9 %  sodium chloride infusion  500 mL Intravenous Once Nandigam, Venia Minks, MD        Family History  Problem Relation Age of Onset  . Heart disease Mother   . Diabetes Mother   . Tuberculosis Paternal Grandfather   .  Bone cancer Paternal Grandfather   . Breast cancer Sister 67  . Melanoma Sister   . Cirrhosis Maternal Grandfather   . Heart failure Paternal Grandmother   . Colon cancer Neg Hx   Sister with breast cancer in her early 63's, ER+. Never had genetic testing.   Review of Systems  Constitutional: Negative.   HENT: Negative.   Eyes: Negative.   Respiratory: Negative.   Cardiovascular: Negative.   Gastrointestinal: Negative.   Endocrine: Negative.   Genitourinary: Negative.   Musculoskeletal: Negative.   Skin: Negative.   Allergic/Immunologic: Negative.   Neurological: Negative.   Hematological: Negative.   Psychiatric/Behavioral: Negative.     Exam:   BP 126/78 (BP Location: Right Arm, Patient Position: Sitting, Cuff Size: Normal)   Pulse 88   Ht 5\' 3"  (1.6 m)   Wt 153 lb 9.6 oz (69.7 kg)   LMP 09/04/2002 (Exact Date)   BMI 27.21 kg/m   Weight change:  @WEIGHTCHANGE @ Height:   Height: 5\' 3"  (160 cm)  Ht Readings from Last 3 Encounters:  08/01/18 5\' 3"  (1.6 m)  03/13/18 5' 3.25" (1.607 m)  01/08/18 5\' 4"  (1.626 m)    General appearance: alert, cooperative and appears stated age Head: Normocephalic, without obvious abnormality, atraumatic Neck: no adenopathy, supple, symmetrical, trachea midline and thyroid normal to inspection and palpation Lungs: clear to auscultation bilaterally Cardiovascular: regular rate and rhythm Breasts: normal appearance, no masses or tenderness Abdomen: soft, non-tender; non distended,  no masses,  no organomegaly Extremities: extremities normal, atraumatic, no cyanosis or edema Skin: Skin color, texture, turgor normal. No rashes or lesions Lymph nodes: Cervical, supraclavicular, and axillary nodes normal. No abnormal inguinal nodes palpated Neurologic: Grossly normal   Pelvic: External genitalia:  no lesions              Urethra:  normal appearing urethra with no masses, tenderness or lesions              Bartholins and Skenes: normal                 Vagina: normal appearing vagina with normal color and discharge, no lesions              Cervix: no lesions               Bimanual Exam:  Uterus:  normal size, contour, position, consistency, mobility, non-tender              Adnexa: no mass, fullness, tenderness               Rectovaginal: Confirms               Anus:  normal sphincter tone, no lesions  Chaperone was present for exam.  A:  Well Woman with normal exam  Urinary urgency, urge incontinence, mostly tolerable  P:   No pap this year  Discussed breast self exam  Discussed calcium and vit D intake  Mammogram   Colonoscopy UTD  ccua for ua, c&s, decrease caffeine, discussed the option of PT and medication

## 2018-08-01 ENCOUNTER — Encounter: Payer: Self-pay | Admitting: Obstetrics and Gynecology

## 2018-08-01 ENCOUNTER — Ambulatory Visit (INDEPENDENT_AMBULATORY_CARE_PROVIDER_SITE_OTHER): Payer: Medicare Other | Admitting: Obstetrics and Gynecology

## 2018-08-01 ENCOUNTER — Other Ambulatory Visit: Payer: Self-pay | Admitting: Obstetrics and Gynecology

## 2018-08-01 ENCOUNTER — Other Ambulatory Visit: Payer: Self-pay

## 2018-08-01 VITALS — BP 126/78 | HR 88 | Ht 63.0 in | Wt 153.6 lb

## 2018-08-01 DIAGNOSIS — Z124 Encounter for screening for malignant neoplasm of cervix: Secondary | ICD-10-CM

## 2018-08-01 DIAGNOSIS — N952 Postmenopausal atrophic vaginitis: Secondary | ICD-10-CM

## 2018-08-01 DIAGNOSIS — N3941 Urge incontinence: Secondary | ICD-10-CM

## 2018-08-01 DIAGNOSIS — R3915 Urgency of urination: Secondary | ICD-10-CM | POA: Diagnosis not present

## 2018-08-01 DIAGNOSIS — Z01419 Encounter for gynecological examination (general) (routine) without abnormal findings: Secondary | ICD-10-CM

## 2018-08-01 DIAGNOSIS — M8580 Other specified disorders of bone density and structure, unspecified site: Secondary | ICD-10-CM

## 2018-08-01 MED ORDER — ESTRADIOL 10 MCG VA TABS: 1.0000 | ORAL_TABLET | VAGINAL | 3 refills | Status: DC

## 2018-08-01 NOTE — Telephone Encounter (Signed)
Script sent  

## 2018-08-01 NOTE — Patient Instructions (Addendum)
EXERCISE AND DIET:  We recommended that you start or continue a regular exercise program for good health. Regular exercise means any activity that makes your heart beat faster and makes you sweat.  We recommend exercising at least 30 minutes per day at least 3 days a week, preferably 4 or 5.  We also recommend a diet low in fat and sugar.  Inactivity, poor dietary choices and obesity can cause diabetes, heart attack, stroke, and kidney damage, among others.    ALCOHOL AND SMOKING:  Women should limit their alcohol intake to no more than 7 drinks/beers/glasses of wine (combined, not each!) per week. Moderation of alcohol intake to this level decreases your risk of breast cancer and liver damage. And of course, no recreational drugs are part of a healthy lifestyle.  And absolutely no smoking or even second hand smoke. Most people know smoking can cause heart and lung diseases, but did you know it also contributes to weakening of your bones? Aging of your skin?  Yellowing of your teeth and nails?  CALCIUM AND VITAMIN D:  Adequate intake of calcium and Vitamin D are recommended.  The recommendations for exact amounts of these supplements seem to change often, but generally speaking 1,200 mg of calcium (between diet and supplement) and 800 units of Vitamin D per day seems prudent. Certain women may benefit from higher intake of Vitamin D.  If you are among these women, your doctor will have told you during your visit.    PAP SMEARS:  Pap smears, to check for cervical cancer or precancers,  have traditionally been done yearly, although recent scientific advances have shown that most women can have pap smears less often.  However, every woman still should have a physical exam from her gynecologist every year. It will include a breast check, inspection of the vulva and vagina to check for abnormal growths or skin changes, a visual exam of the cervix, and then an exam to evaluate the size and shape of the uterus and  ovaries.  And after 69 years of age, a rectal exam is indicated to check for rectal cancers. We will also provide age appropriate advice regarding health maintenance, like when you should have certain vaccines, screening for sexually transmitted diseases, bone density testing, colonoscopy, mammograms, etc.   MAMMOGRAMS:  All women over 75 years old should have a yearly mammogram. Many facilities now offer a "3D" mammogram, which may cost around $50 extra out of pocket. If possible,  we recommend you accept the option to have the 3D mammogram performed.  It both reduces the number of women who will be called back for extra views which then turn out to be normal, and it is better than the routine mammogram at detecting truly abnormal areas.    COLONOSCOPY:  Colonoscopy to screen for colon cancer is recommended for all women at age 95.  We know, you hate the idea of the prep.  We agree, BUT, having colon cancer and not knowing it is worse!!  Colon cancer so often starts as a polyp that can be seen and removed at colonscopy, which can quite literally save your life!  And if your first colonoscopy is normal and you have no family history of colon cancer, most women don't have to have it again for 10 years.  Once every ten years, you can do something that may end up saving your life, right?  We will be happy to help you get it scheduled when you are ready.  Be sure to check your insurance coverage so you understand how much it will cost.  It may be covered as a preventative service at no cost, but you should check your particular policy.    Urinary Incontinence Urinary incontinence is the involuntary loss of urine from your bladder. What are the causes? There are many causes of urinary incontinence. They include:  Medicines.  Infections.  Prostatic enlargement, leading to overflow of urine from your bladder.  Surgery.  Neurological diseases.  Emotional factors.  What are the signs or  symptoms? Urinary Incontinence can be divided into four types: 1. Urge incontinence. Urge incontinence is the involuntary loss of urine before you have the opportunity to go to the bathroom. There is a sudden urge to void but not enough time to reach a bathroom. 2. Stress incontinence. Stress incontinence is the sudden loss of urine with any activity that forces urine to pass. It is commonly caused by anatomical changes to the pelvis and sphincter areas of your body. 3. Overflow incontinence. Overflow incontinence is the loss of urine from an obstructed opening to your bladder. This results in a backup of urine and a resultant buildup of pressure within the bladder. When the pressure within the bladder exceeds the closing pressure of the sphincter, the urine overflows, which causes incontinence, similar to water overflowing a dam. 4. Total incontinence. Total incontinence is the loss of urine as a result of the inability to store urine within your bladder.  How is this diagnosed? Evaluating the cause of incontinence may require:  A thorough and complete medical and obstetric history.  A complete physical exam.  Laboratory tests such as a urine culture and sensitivities.  When additional tests are indicated, they can include:  An ultrasound exam.  Kidney and bladder X-rays.  Cystoscopy. This is an exam of the bladder using a narrow scope.  Urodynamic testing to test the nerve function to the bladder and sphincter areas.  How is this treated? Treatment for urinary incontinence depends on the cause:  For urge incontinence caused by a bacterial infection, antibiotics will be prescribed. If the urge incontinence is related to medicines you take, your health care provider may have you change the medicine.  For stress incontinence, surgery to re-establish anatomical support to the bladder or sphincter, or both, will often correct the condition.  For overflow incontinence caused by an  enlarged prostate, an operation to open the channel through the enlarged prostate will allow the flow of urine out of the bladder. In women with fibroids, a hysterectomy may be recommended.  For total incontinence, surgery on your urinary sphincter may help. An artificial urinary sphincter (an inflatable cuff placed around the urethra) may be required. In women who have developed a hole-like passage between their bladder and vagina (vesicovaginal fistula), surgery to close the fistula often is required.  Follow these instructions at home:  Normal daily hygiene and the use of pads or adult diapers that are changed regularly will help prevent odors and skin damage.  Avoid caffeine. It can overstimulate your bladder.  Use the bathroom regularly. Try about every 2-3 hours to go to the bathroom, even if you do not feel the need to do so. Take time to empty your bladder completely. After urinating, wait a minute. Then try to urinate again.  For causes involving nerve dysfunction, keep a log of the medicines you take and a journal of the times you go to the bathroom. Contact a health care provider if:  You experience worsening of pain instead of improvement in pain after your procedure.  Your incontinence becomes worse instead of better. Get help right away if:  You experience fever or shaking chills.  You are unable to pass your urine.  You have redness spreading into your groin or down into your thighs. This information is not intended to replace advice given to you by your health care provider. Make sure you discuss any questions you have with your health care provider. Document Released: 10/13/2004 Document Revised: 04/15/2016 Document Reviewed: 02/12/2013 Elsevier Interactive Patient Education  2018 [employer] Breast self-awareness means being familiar with how your breasts look and feel. It involves checking your breasts regularly and reporting any changes to  your health care provider. Practicing breast self-awareness is important. A change in your breasts can be a sign of a serious medical problem. Being familiar with how your breasts look and feel allows you to find any problems early, when treatment is more likely to be successful. All women should practice breast self-awareness, including women who have had breast implants. How to do a breast self-exam One way to learn what is normal for your breasts and whether your breasts are changing is to do a breast self-exam. To do a breast self-exam: Look for Changes  1. Remove all the clothing above your waist. 2. Stand in front of a mirror in a room with good lighting. 3. Put your hands on your hips. 4. Push your hands firmly downward. 5. Compare your breasts in the mirror. Look for differences between them (asymmetry), such as: ? Differences in shape. ? Differences in size. ? Puckers, dips, and bumps in one breast and not the other. 6. Look at each breast for changes in your skin, such as: ? Redness. ? Scaly areas. 7. Look for changes in your nipples, such as: ? Discharge. ? Bleeding. ? Dimpling. ? Redness. ? A change in position. Feel for Changes  Carefully feel your breasts for lumps and changes. It is best to do this while lying on your back on the floor and again while sitting or standing in the shower or tub with soapy water on your skin. Feel each breast in the following way:  Place the arm on the side of the breast you are examining above your head.  Feel your breast with the other hand.  Start in the nipple area and make  inch (2 cm) overlapping circles to feel your breast. Use the pads of your three middle fingers to do this. Apply light pressure, then medium pressure, then firm pressure. The light pressure will allow you to feel the tissue closest to the skin. The medium pressure will allow you to feel the tissue that is a little deeper. The firm pressure will allow you to feel the  tissue close to the ribs.  Continue the overlapping circles, moving downward over the breast until you feel your ribs below your breast.  Move one finger-width toward the center of the body. Continue to use the  inch (2 cm) overlapping circles to feel your breast as you move slowly up toward your collarbone.  Continue the up and down exam using all three pressures until you reach your armpit.  Write Down What You Find  Write down what is normal for each breast and any changes that you find. Keep a written record with breast changes or normal findings for each breast. By writing this information down, you do not need to depend only  on memory for size, tenderness, or location. Write down where you are in your menstrual cycle, if you are still menstruating. If you are having trouble noticing differences in your breasts, do not get discouraged. With time you will become more familiar with the variations in your breasts and more comfortable with the exam. How often should I examine my breasts? Examine your breasts every month. If you are breastfeeding, the best time to examine your breasts is after a feeding or after using a breast pump. If you menstruate, the best time to examine your breasts is 5-7 days after your period is over. During your period, your breasts are lumpier, and it may be more difficult to notice changes. When should I see my health care provider? See your health care provider if you notice:  A change in shape or size of your breasts or nipples.  A change in the skin of your breast or nipples, such as a reddened or scaly area.  Unusual discharge from your nipples.  A lump or thick area that was not there before.  Pain in your breasts.  Anything that concerns you.  This information is not intended to replace advice given to you by your health care provider. Make sure you discuss any questions you have with your health care provider. Document Released: 09/05/2005 Document  Revised: 02/11/2016 Document Reviewed: 07/26/2015 Elsevier Interactive Patient Education  [employer].

## 2018-08-01 NOTE — Telephone Encounter (Signed)
Medication refill request: estrace vaginal cream  Last AEX:  today Next AEX: 08-05-19 Last MMG (if hormonal medication request): 08-31-18 Refill authorized: refill from pharmacy states they needs directions for medication. Please fill out rx attached.

## 2018-08-02 DIAGNOSIS — Z6826 Body mass index (BMI) 26.0-26.9, adult: Secondary | ICD-10-CM | POA: Diagnosis not present

## 2018-08-02 DIAGNOSIS — R05 Cough: Secondary | ICD-10-CM | POA: Diagnosis not present

## 2018-08-02 DIAGNOSIS — J45909 Unspecified asthma, uncomplicated: Secondary | ICD-10-CM | POA: Diagnosis not present

## 2018-08-02 DIAGNOSIS — J189 Pneumonia, unspecified organism: Secondary | ICD-10-CM | POA: Diagnosis not present

## 2018-08-02 LAB — URINALYSIS, MICROSCOPIC ONLY: Casts: NONE SEEN /lpf

## 2018-08-02 LAB — URINE CULTURE

## 2018-08-03 ENCOUNTER — Telehealth: Payer: Self-pay | Admitting: Emergency Medicine

## 2018-08-03 NOTE — Telephone Encounter (Signed)
-----   Message from Salvadore Dom, MD sent at 08/02/2018  5:35 PM EST ----- Please let the patient know that she had crystals in her urine, these can form into kidney stones. She should stay well hydrated to try and prevent this. Urine culture is pending.

## 2018-08-03 NOTE — Telephone Encounter (Signed)
Result Notes for Urine Microscopic   Notes recorded by Perlie Scheuring, Trellis Paganini, RN on 08/03/2018 at 12:53 PM EST Patient notified of urine micro and culture results. Will increase fluids as directed and call back as needed.

## 2018-08-06 ENCOUNTER — Telehealth: Payer: Self-pay

## 2018-08-06 NOTE — Telephone Encounter (Signed)
Left message to call Kaitlyn at [phone]. 

## 2018-08-06 NOTE — Telephone Encounter (Signed)
Spoke with patient. Results given. Patient verbalizes understanding. Encounter closed. 

## 2018-08-06 NOTE — Telephone Encounter (Signed)
-----   Message from Salvadore Dom, MD sent at 08/05/2018 12:30 PM EST ----- Culture is normal, please inform.

## 2018-08-07 DIAGNOSIS — Z23 Encounter for immunization: Secondary | ICD-10-CM | POA: Diagnosis not present

## 2018-08-10 DIAGNOSIS — J301 Allergic rhinitis due to pollen: Secondary | ICD-10-CM | POA: Diagnosis not present

## 2018-08-10 DIAGNOSIS — J3089 Other allergic rhinitis: Secondary | ICD-10-CM | POA: Diagnosis not present

## 2018-08-28 ENCOUNTER — Encounter: Attending: Family | Primary: Family

## 2018-09-07 DIAGNOSIS — J301 Allergic rhinitis due to pollen: Secondary | ICD-10-CM | POA: Diagnosis not present

## 2018-09-07 DIAGNOSIS — J3089 Other allergic rhinitis: Secondary | ICD-10-CM | POA: Diagnosis not present

## 2018-09-18 ENCOUNTER — Other Ambulatory Visit: Payer: Self-pay | Admitting: Internal Medicine

## 2018-09-18 ENCOUNTER — Other Ambulatory Visit: Payer: Self-pay | Admitting: Obstetrics and Gynecology

## 2018-09-18 DIAGNOSIS — Z1231 Encounter for screening mammogram for malignant neoplasm of breast: Secondary | ICD-10-CM

## 2018-09-20 DIAGNOSIS — E785 Hyperlipidemia, unspecified: Secondary | ICD-10-CM | POA: Diagnosis not present

## 2018-09-20 DIAGNOSIS — R82998 Other abnormal findings in urine: Secondary | ICD-10-CM | POA: Diagnosis not present

## 2018-09-20 DIAGNOSIS — J301 Allergic rhinitis due to pollen: Secondary | ICD-10-CM | POA: Diagnosis not present

## 2018-09-20 DIAGNOSIS — E559 Vitamin D deficiency, unspecified: Secondary | ICD-10-CM | POA: Diagnosis not present

## 2018-09-20 DIAGNOSIS — J3089 Other allergic rhinitis: Secondary | ICD-10-CM | POA: Diagnosis not present

## 2018-09-20 DIAGNOSIS — E7849 Other hyperlipidemia: Secondary | ICD-10-CM | POA: Diagnosis not present

## 2018-09-21 ENCOUNTER — Ambulatory Visit
Admission: RE | Admit: 2018-09-21 | Discharge: 2018-09-21 | Disposition: A | Discharge status: Home / Self Care | Payer: Medicare Other | Source: Ambulatory Visit | Attending: Internal Medicine | Admitting: Internal Medicine

## 2018-09-21 DIAGNOSIS — Z1231 Encounter for screening mammogram for malignant neoplasm of breast: Secondary | ICD-10-CM | POA: Diagnosis not present

## 2018-09-22 ENCOUNTER — Encounter

## 2018-09-24 MED ORDER — METFORMIN SR 500 MG 24 HR TABLET
500 mg | ORAL_TABLET | ORAL | 0 refills | Status: DC
Start: 2018-09-24 — End: 2019-03-15

## 2018-09-26 ENCOUNTER — Ambulatory Visit (INDEPENDENT_AMBULATORY_CARE_PROVIDER_SITE_OTHER): Payer: Medicare Other

## 2018-09-26 ENCOUNTER — Ambulatory Visit (INDEPENDENT_AMBULATORY_CARE_PROVIDER_SITE_OTHER): Payer: Medicare Other | Admitting: Orthopaedic Surgery

## 2018-09-26 ENCOUNTER — Encounter (INDEPENDENT_AMBULATORY_CARE_PROVIDER_SITE_OTHER): Payer: Self-pay | Admitting: Orthopaedic Surgery

## 2018-09-26 DIAGNOSIS — M25562 Pain in left knee: Secondary | ICD-10-CM | POA: Diagnosis not present

## 2018-09-26 DIAGNOSIS — M25512 Pain in left shoulder: Secondary | ICD-10-CM

## 2018-09-26 DIAGNOSIS — E559 Vitamin D deficiency, unspecified: Secondary | ICD-10-CM | POA: Diagnosis not present

## 2018-09-26 DIAGNOSIS — Z0000 Encounter for general adult medical examination without abnormal findings: Secondary | ICD-10-CM | POA: Diagnosis not present

## 2018-09-26 DIAGNOSIS — J3089 Other allergic rhinitis: Secondary | ICD-10-CM | POA: Diagnosis not present

## 2018-09-26 DIAGNOSIS — K219 Gastro-esophageal reflux disease without esophagitis: Secondary | ICD-10-CM | POA: Diagnosis not present

## 2018-09-26 DIAGNOSIS — F329 Major depressive disorder, single episode, unspecified: Secondary | ICD-10-CM | POA: Diagnosis not present

## 2018-09-26 DIAGNOSIS — Z1389 Encounter for screening for other disorder: Secondary | ICD-10-CM | POA: Diagnosis not present

## 2018-09-26 DIAGNOSIS — Z6826 Body mass index (BMI) 26.0-26.9, adult: Secondary | ICD-10-CM | POA: Diagnosis not present

## 2018-09-26 DIAGNOSIS — J45998 Other asthma: Secondary | ICD-10-CM | POA: Diagnosis not present

## 2018-09-26 DIAGNOSIS — K5909 Other constipation: Secondary | ICD-10-CM | POA: Diagnosis not present

## 2018-09-26 DIAGNOSIS — E7849 Other hyperlipidemia: Secondary | ICD-10-CM | POA: Diagnosis not present

## 2018-09-26 DIAGNOSIS — J188 Other pneumonia, unspecified organism: Secondary | ICD-10-CM | POA: Diagnosis not present

## 2018-09-26 DIAGNOSIS — D126 Benign neoplasm of colon, unspecified: Secondary | ICD-10-CM | POA: Diagnosis not present

## 2018-09-26 MED ORDER — LIDOCAINE HCL 1 % IJ SOLN
3.0000 mL | INTRAMUSCULAR | Status: AC | PRN
  Administered 2018-09-26: 3 mL

## 2018-09-26 MED ORDER — METHYLPREDNISOLONE ACETATE 40 MG/ML IJ SUSP
40.0000 mg | INTRAMUSCULAR | Status: AC | PRN
  Administered 2018-09-26: 40 mg via INTRA_ARTICULAR

## 2018-09-26 NOTE — Progress Notes (Signed)
Office Visit Note   Patient: Alyssa Elliott           Date of Birth: [birth year]           MRN: [id number] Visit Date: 09/26/2018              Requested by: Prince Solian, MD [address] [place], [place] [address] PCP: Prince Solian, MD   Assessment & Plan: Visit Diagnoses:  1. Acute pain of left knee   2. Acute pain of left shoulder     Plan: Discussed with patient natural anti-inflammatories also natural anti-inflammatories.  Quad strengthening discussed.  Knee friendly exercises discussed.  Have her follow-up with Dr. Ninfa Linden in 6 weeks to check shoulder in the knee.  She tolerated the subacromial injection that was given by Dr. Ninfa Linden left shoulder today well.  Questions were encouraged and answered by Dr. Ninfa Linden and myself at length.  Follow-Up Instructions: Return in about 6 weeks (around 11/07/2018).   Orders:  Orders Placed This Encounter  Procedures  . XR KNEE 3 VIEW LEFT  . XR Shoulder Left   No orders of the defined types were placed in this encounter.     Procedures: Large Joint Inj: L subacromial bursa on 09/26/2018 5:04 PM Indications: pain Details: 22 G 1.5 in needle, posterior approach  Arthrogram: No  Medications: 3 mL lidocaine 1 %; 40 mg methylPREDNISolone acetate 40 MG/ML Outcome: tolerated well, no immediate complications Procedure, treatment alternatives, risks and benefits explained, specific risks discussed. Consent was given by the patient. Immediately prior to procedure a time out was called to verify the correct patient, procedure, equipment, support staff and site/side marked as required. Patient was prepped and draped in the usual sterile fashion.       Clinical Data: No additional findings.   Subjective: Chief Complaint  Patient presents with  . Left Shoulder - Pain  . Left Knee - Pain    HPI Alyssa Elliott 68 year old female who comes in today with 2 complaints.  She is having left shoulder pain and left knee  pain that is been ongoing over the last couple months no known injury.  Left shoulder she has popping cracking and decreased range of motion of the shoulder due to pain.  She has a history of a right shoulder rotator cuff tear that elected not to have repaired has done well with. Left knee pain she describes as occasional giving way but otherwise no other mechanical symptoms.  She has pain on the kneecap consistent achy sensation in the knee.  No significant swelling.  She has trouble getting up and down due to the pain in the knee.  She is tried Tylenol for both of these.  Currently today the shoulder is more bothersome than her knee. Review of Systems Please see HPI otherwise negative  Objective: Vital Signs: LMP 09/04/2002 (Exact Date)   Physical Exam Constitutional:      Appearance: She is not ill-appearing or diaphoretic.  Pulmonary:     Effort: Pulmonary effort is normal.  Neurological:     Mental Status: She is alert and oriented to person, place, and time.  Psychiatric:        Mood and Affect: Mood normal.        Behavior: Behavior normal.     Ortho Exam Bilateral shoulders she has 5-5 strength with external and internal rotation against resistance.  Empty can test negative bilaterally.  Positive impingement on the left.  Liftoff test positive on the left negative  on the right. Bilateral knees full range of motion of both knees.  No instability valgus varus stressing of either knee.  Maximal tenderness left knee over the lateral joint line slight tenderness over the medial joint line.  Osmond-Clarke negative bilaterally.  Left knee no abnormal warmth erythema or effusion. Specialty Comments:  No specialty comments available.  Imaging: Xr Knee 3 View Left  Result Date: 09/26/2018 Left knee 3 views: No acute fractures.  Moderate medial joint line narrowing.  Mild to moderate patellofemoral changes.  No acute fractures bony abnormalities otherwise.  Xr Shoulder Left  Result  Date: 09/26/2018 Left shoulder 3 views: Shoulders well located.  No acute fractures.  Subacromial space well maintained.  Glenohumeral joint well-maintained.    PMFS History: Patient Active Problem List   Diagnosis Date Noted  . Lung nodule seen on imaging study 11/12/2012  . Maxillary sinusitis, chronic 10/27/2012  . Hemorrhoids 05/04/2012  . Thrombosed external hemorrhoid 05/04/2012  . Seasonal and perennial allergic rhinitis 10/16/2011  . Asthma with bronchitis 10/16/2011  . GERD (gastroesophageal reflux disease) 10/16/2011   Past Medical History:  Diagnosis Date  . Allergy   . Anxiety   . Asthma   . Cataract 2018   removed  . DDD (degenerative disc disease)   . Depression   . GERD (gastroesophageal reflux disease)   . Hemorrhoids   . Hyperlipidemia   . Vitamin D deficiency     Family History  Problem Relation Age of Onset  . Heart disease Mother   . Diabetes Mother   . Tuberculosis Paternal Grandfather   . Bone cancer Paternal Grandfather   . Breast cancer Sister 86  . Melanoma Sister   . Cirrhosis Maternal Grandfather   . Heart failure Paternal Grandmother   . Colon cancer Neg Hx     Past Surgical History:  Procedure Laterality Date  . 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 cataract surgery    . FOOT NEUROMA SURGERY     multiple times bilaterally  . PLANTAR FASCIA RELEASE     Social History   Occupational History  . Occupation: retired Catering manager  Tobacco Use  . Smoking status: Never Smoker  . Smokeless tobacco: Never Used  Substance and Sexual Activity  . Alcohol use: No  . Drug use: No  . Sexual activity: Not Currently    Partners: Male    Birth control/protection: Post-menopausal

## 2018-09-27 DIAGNOSIS — Z1212 Encounter for screening for malignant neoplasm of rectum: Secondary | ICD-10-CM | POA: Diagnosis not present

## 2018-10-16 ENCOUNTER — Encounter

## 2018-10-17 MED ORDER — AMLODIPINE 10 MG TAB
10 mg | ORAL_TABLET | ORAL | 0 refills | Status: DC
Start: 2018-10-17 — End: 2018-12-06

## 2018-10-18 ENCOUNTER — Encounter: Payer: Self-pay | Admitting: Pulmonary Disease

## 2018-10-18 ENCOUNTER — Ambulatory Visit (INDEPENDENT_AMBULATORY_CARE_PROVIDER_SITE_OTHER): Payer: Medicare Other | Admitting: Pulmonary Disease

## 2018-10-18 DIAGNOSIS — J320 Chronic maxillary sinusitis: Secondary | ICD-10-CM | POA: Diagnosis not present

## 2018-10-18 DIAGNOSIS — R0982 Postnasal drip: Secondary | ICD-10-CM | POA: Diagnosis not present

## 2018-10-18 DIAGNOSIS — J45909 Unspecified asthma, uncomplicated: Secondary | ICD-10-CM | POA: Diagnosis not present

## 2018-10-18 DIAGNOSIS — J471 Bronchiectasis with (acute) exacerbation: Secondary | ICD-10-CM | POA: Diagnosis not present

## 2018-10-18 MED ORDER — BUDESONIDE-FORMOTEROL FUMARATE 80-4.5 MCG/ACT IN AERO: 2.0000 | INHALATION_SPRAY | Freq: Two times a day (BID) | RESPIRATORY_TRACT | 0 refills | Status: DC

## 2018-10-18 NOTE — Patient Instructions (Signed)
You seem to be having recurrent attacks of bronchitis, probably triggered by sinusitis or allergies  Proceed with limited CT of sinuses and high-resolution CT of the chest to rule out bronchiectasis If we find sinus collection, we will treat with a longer duration of antibiotics or take ENT input  Meanwhile, prescription for Symbicort 80 -2 puffs twice daily or if this continues to cause a raspy voice then decrease to 1 puff twice daily Rinse mouth after use

## 2018-10-18 NOTE — Assessment & Plan Note (Signed)
limited CT of sinuses  If sinus collection, will need ENT input or prolonged duration of antibiotics

## 2018-10-18 NOTE — Progress Notes (Signed)
Subjective:    Patient ID: Alyssa Elliott, female    DOB: 09-07-1951, 68 y.o.   MRN: [id number]  HPI  Chief Complaint  Patient presents with  . Pulm Consult    Self referral for chronic bronchitis. Noticed this within the last year. Has a dry cough. Has been on abx and nothing has helped.    68 year old never smoker, retired Catering manager, presents for evaluation of chronic bronchial infections  She moved from [place] to [place] in 1983 and reports allergies since then.  She sees Dr. Donneta Romberg and has been getting allergy shots every 2 weeks, noted to be allergic to grasses and trees although RAST performed in our office in 2014 shows low counts, this possibly could have been because she was on allergy shots. She also reports frequent attacks of sinusitis and I note CT scans from 2014 which show extensive maxillary and frontal sinusitis.  She was diagnosed with adult onset asthma and has been on some form of inhaled steroid for the past 7 to 8 years, in the past she was on [place].  More recently she has been on Breo which caused her the shakes and a raspy voice, this was changed to Symbicort and this again caused a raspy voice and a chronic cough hence she decrease the dose to 2 puffs once a day and this seems to have been working well and does not make her as tremulous  She had an episode of "pneumonia" in December 2019 and required antibiotics.  She recovered from this and then soon after getting a shingles shot 2 weeks ago she got sick again with a sore throat and required prednisone and a Z-Pak She does report nasal twang to her voice.  Review of CT scan from 2014 does not show any evidence of bronchiectasis mild patchy groundglass infiltrate in the right middle lobe  She denies obvious postnasal drip or heartburn she is a never smoker drinks a glass of red wine at night    Past Medical History:  Diagnosis Date  . Allergy   . Anxiety   . Asthma   . Cataract 2018   removed    . DDD (degenerative disc disease)   . Depression   . GERD (gastroesophageal reflux disease)   . Hemorrhoids   . Hyperlipidemia   . Vitamin D deficiency      Significant tests/ events reviewed  PFT-10/22/12- within normal limits. FVC 2.76/92%, FEV1 2.16/99%, FEV1/FVC 0.78. Slight response to bronchodilator. TLC 83%, DLCO 94%. RAST 09/2012  Total IgE 24.3, elevated for grass pollens. ESR 13, EOS 11.6.  CT chest 02/2013 no obvious bronchiectasis, mild groundglass and scarring CT maxillary sinuses 09/2012-bilateral maxillary sinusitis and frontal sinus collection   Past Surgical History:  Procedure Laterality Date  . 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 cataract surgery    . FOOT NEUROMA SURGERY     multiple times bilaterally  . PLANTAR FASCIA RELEASE      Allergies  Allergen Reactions  . Codeine Nausea And Vomiting  . Penicillins Swelling    Swelling  Of joints  . Latex Rash    redness  . Sulfa Antibiotics Rash    Social History   Socioeconomic History  . Marital status: Married    Spouse name: Not on file  . Number of children: 0  . Years of education: Not on file  . Highest education level: Not on file  Occupational History  . Occupation: retired Catering manager  Social Needs  . Financial resource strain: Not on file  . Food insecurity:    Worry: Not on file    Inability: Not on file  . Transportation needs:    Medical: Not on file    Non-medical: Not on file  Tobacco Use  . Smoking status: Never Smoker  . Smokeless tobacco: Never Used  Substance and Sexual Activity  . Alcohol use: No  . Drug use: No  . Sexual activity: Not Currently    Partners: Male    Birth control/protection: Post-menopausal  Lifestyle  . Physical activity:    Days per week: Not on file    Minutes per session: Not on file  . Stress: Not on file  Relationships  . Social connections:    Talks on phone: Not on file    Gets together: Not on file    Attends religious service:  Not on file    Active member of club or organization: Not on file    Attends meetings of clubs or organizations: Not on file    Relationship status: Not on file  . Intimate partner violence:    Fear of current or ex partner: Not on file    Emotionally abused: Not on file    Physically abused: Not on file    Forced sexual activity: Not on file  Other Topics Concern  . Not on file  Social History Narrative  . Not on file    Family History  Problem Relation Age of Onset  . Heart disease Mother   . Diabetes Mother   . Tuberculosis Paternal Grandfather   . Bone cancer Paternal Grandfather   . Breast cancer Sister 51  . Melanoma Sister   . Cirrhosis Maternal Grandfather   . Heart failure Paternal Grandmother   . Colon cancer Neg Hx     Review of Systems  Constitutional: Negative for fever and unexpected weight change.  HENT: Positive for congestion and sore throat. Negative for dental problem, ear pain, nosebleeds, postnasal drip, rhinorrhea, sinus pressure, sneezing and trouble swallowing.   Eyes: Negative for redness and itching.  Respiratory: Positive for cough. Negative for chest tightness, shortness of breath and wheezing.   Cardiovascular: Negative for palpitations and leg swelling.  Gastrointestinal: Negative for nausea and vomiting.  Genitourinary: Negative for dysuria.  Musculoskeletal: Negative for joint swelling.  Skin: Negative for rash.  Allergic/Immunologic: Negative.  Negative for environmental allergies, food allergies and immunocompromised state.  Neurological: Negative for headaches.  Hematological: Does not bruise/bleed easily.  Psychiatric/Behavioral: Negative for dysphoric mood. The patient is not nervous/anxious.        Objective:   Physical Exam  Gen. Pleasant, well-nourished, in no distress, normal affect ENT - no pallor,icterus, no post nasal drip Neck: No JVD, no thyromegaly, no carotid bruits Lungs: no use of accessory muscles, no dullness to  percussion, clear without rales or rhonchi  Cardiovascular: Rhythm regular, heart sounds  normal, no murmurs or gallops, no peripheral edema Abdomen: soft and non-tender, no hepatosplenomegaly, BS normal. Musculoskeletal: No deformities, no cyanosis or clubbing Neuro:  alert, non focal       Assessment & Plan:

## 2018-10-18 NOTE — Assessment & Plan Note (Addendum)
-   recurrent attacks of bronchitis, probably triggered by sinusitis or allergies Doubt "typical asthma" hence feel that dose of inhaled steroids can be decreased since this is likely causing her hoarseness of voice and cough  Proceed with high-resolution CT of the chest to rule out bronchiectasis If we find sinus collection, we will treat with a longer duration of antibiotics or take ENT input  Meanwhile, prescription for Symbicort 80 -2 puffs twice daily or if this continues to cause a raspy voice then decrease to 1 puff twice daily Rinse mouth after use

## 2018-10-19 DIAGNOSIS — J3089 Other allergic rhinitis: Secondary | ICD-10-CM | POA: Diagnosis not present

## 2018-10-19 DIAGNOSIS — J301 Allergic rhinitis due to pollen: Secondary | ICD-10-CM | POA: Diagnosis not present

## 2018-10-24 ENCOUNTER — Telehealth: Payer: Self-pay | Admitting: Pulmonary Disease

## 2018-10-25 NOTE — Telephone Encounter (Signed)
I had called pt to find out where she would like to get CT's scheduled.  Called back & spoke to husband.  He states [facility] in [place].  I will get CT's scheduled and call them back.  Nothing further needed on this message.

## 2018-10-30 ENCOUNTER — Ambulatory Visit: Admit: 2018-10-30 | Discharge: 2018-10-30 | Payer: MEDICARE | Attending: Family | Primary: Family

## 2018-10-30 ENCOUNTER — Ambulatory Visit: Attending: Family | Primary: Family

## 2018-10-30 DIAGNOSIS — E1121 Type 2 diabetes mellitus with diabetic nephropathy: Secondary | ICD-10-CM

## 2018-10-30 LAB — AMB POC HEMOGLOBIN A1C
Hemoglobin A1C, POC: 12.1 % — AB (ref 4.8–5.6)
Hemoglobin A1c (POC): 12.1 % — AB (ref 4.8–5.6)

## 2018-10-30 MED ORDER — INSULIN DETEMIR 100 UNIT/ML (3 ML) SUB-Q PEN
100 unit/mL (3 mL) | Freq: Every day | SUBCUTANEOUS | 3 refills | Status: DC
Start: 2018-10-30 — End: 2019-10-14

## 2018-10-30 MED ORDER — GABAPENTIN 300 MG CAP
300 mg | ORAL_CAPSULE | Freq: Every evening | ORAL | 3 refills | Status: DC
Start: 2018-10-30 — End: 2019-10-14
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
## 2018-10-30 NOTE — Patient Instructions (Signed)
Learning About Diabetes Food Guidelines  Your Care Instructions    Meal planning is important to manage diabetes. It helps keep your blood sugar at a target level (which you set with your doctor). You don't have to eat special foods. You can eat what your family eats, including sweets once in a while. But you do have to pay attention to how often you eat and how much you eat of certain foods.  You may want to work with a dietitian or a certified diabetes educator (CDE) to help you plan meals and snacks. A dietitian or CDE can also help you lose weight if that is one of your goals.  What should you know about eating carbs?  Managing the amount of carbohydrate (carbs) you eat is an important part of healthy meals when you have diabetes. Carbohydrate is found in many foods.  ?? Learn which foods have carbs. And learn the amounts of carbs in different foods.  ? Bread, cereal, pasta, and rice have about 15 grams of carbs in a serving. A serving is 1 slice of bread (1 ounce), ?? cup of cooked cereal, or 1/3 cup of cooked pasta or rice.  ? Fruits have 15 grams of carbs in a serving. A serving is 1 small fresh fruit, such as an apple or orange; ?? of a banana; ?? cup of cooked or canned fruit; ?? cup of fruit juice; 1 cup of melon or raspberries; or 2 tablespoons of dried fruit.  ? Milk and no-sugar-added yogurt have 15 grams of carbs in a serving. A serving is 1 cup of milk or 2/3 cup of no-sugar-added yogurt.  ? Starchy vegetables have 15 grams of carbs in a serving. A serving is ?? cup of mashed potatoes or sweet potato; 1 cup winter squash; ?? of a small baked potato; ?? cup of cooked beans; or ?? cup cooked corn or green peas.  ?? Learn how much carbs to eat each day and at each meal. A dietitian or CDE can teach you how to keep track of the amount of carbs you eat. This is called carbohydrate counting.  ?? If you are not sure how to count carbohydrate grams, use the Plate  Method to plan meals. It is a good, quick way to make sure that you have a balanced meal. It also helps you spread carbs throughout the day.  ? Divide your plate by types of foods. Put non-starchy vegetables on half the plate, meat or other protein food on one-quarter of the plate, and a grain or starchy vegetable in the final quarter of the plate. To this you can add a small piece of fruit and 1 cup of milk or yogurt, depending on how many carbs you are supposed to eat at a meal.  ?? Try to eat about the same amount of carbs at each meal. Do not "save up" your daily allowance of carbs to eat at one meal.  ?? Proteins have very little or no carbs per serving. Examples of proteins are beef, chicken, turkey, fish, eggs, tofu, cheese, cottage cheese, and peanut butter. A serving size of meat is 3 ounces, which is about the size of a deck of cards. Examples of meat substitute serving sizes (equal to 1 ounce of meat) are 1/4 cup of cottage cheese, 1 egg, 1 tablespoon of peanut butter, and ?? cup of tofu.  How can you eat out and still eat healthy?  ?? Learn to estimate the serving sizes of foods   that have carbohydrate. If you measure food at home, it will be easier to estimate the amount in a serving of restaurant food.  ?? If the meal you order has too much carbohydrate (such as potatoes, corn, or baked beans), ask to have a low-carbohydrate food instead. Ask for a salad or green vegetables.  ?? If you use insulin, check your blood sugar before and after eating out to help you plan how much to eat in the future.  ?? If you eat more carbohydrate at a meal than you had planned, take a walk or do other exercise. This will help lower your blood sugar.  What else should you know?  ?? Limit saturated fat, such as the fat from meat and dairy products. This is a healthy choice because people who have diabetes are at higher risk of heart disease. So choose lean cuts of meat and nonfat or low-fat dairy  products. Use olive or canola oil instead of butter or shortening when cooking.  ?? Don't skip meals. Your blood sugar may drop too low if you skip meals and take insulin or certain medicines for diabetes.  ?? Check with your doctor before you drink alcohol. Alcohol can cause your blood sugar to drop too low. Alcohol can also cause a bad reaction if you take certain diabetes medicines.  Follow-up care is a key part of your treatment and safety. Be sure to make and go to all appointments, and call your doctor if you are having problems. It's also a good idea to know your test results and keep a list of the medicines you take.  Where can you learn more?  Go to http://www.healthwise.net/GoodHelpConnections.  Enter I147 in the search box to learn more about "Learning About Diabetes Food Guidelines."  Current as of: January 02, 2018  Content Version: 12.2  ?? 2006-2019 [employer]. Care instructions adapted under license by [employer] (which disclaims liability or warranty for this information). If you have questions about a medical condition or this instruction, always ask your healthcare professional. Healthwise, Inc[employer] disclaims any warranty or liability for your use of this information.         Learning About Meal Planning for Diabetes  Why plan your meals?    Meal planning can be a key part of managing diabetes. Planning meals and snacks with the right balance of carbohydrate, protein, and fat can help you keep your blood sugar at the target level you set with your doctor.  You don't have to eat special foods. You can eat what your family eats, including sweets once in a while. But you do have to pay attention to how often you eat and how much you eat of certain foods.  You may want to work with a dietitian or a certified diabetes educator. He or she can give you tips and meal ideas and can answer your questions about meal planning. This health professional can also help you reach a  healthy weight if that is one of your goals.  What plan is right for you?  Your dietitian or diabetes educator may suggest that you start with the plate format or carbohydrate counting.  The plate format  The plate format is a simple way to help you manage how you eat. You plan meals by learning how much space each food should take on a plate. Using the plate format helps you spread carbohydrate throughout the day. It can make it easier to keep your blood sugar level within   your target range. It also helps you see if you're eating healthy portion sizes.  To use the plate format, you put non-starchy vegetables on half your plate. Add meat or meat substitutes on one-quarter of the plate. Put a grain or starchy vegetable (such as brown rice or a potato) on the final quarter of the plate. You can add a small piece of fruit and some low-fat or fat-free milk or yogurt, depending on your carbohydrate goal for each meal.  Here are some tips for using the plate format:  ?? Make sure that you are not using an oversized plate. A 9-inch plate is best. Many restaurants use larger plates.  ?? Get used to using the plate format at home. Then you can use it when you eat out.  ?? Write down your questions about using the plate format. Talk to your doctor, a dietitian, or a diabetes educator about your concerns.  Carbohydrate counting  With carbohydrate counting, you plan meals based on the amount of carbohydrate in each food. Carbohydrate raises blood sugar higher and more quickly than any other nutrient. It is found in desserts, breads and cereals, and fruit. It's also found in starchy vegetables such as potatoes and corn, grains such as rice and pasta, and milk and yogurt. Spreading carbohydrate throughout the day helps keep your blood sugar levels within your target range.  Your daily amount depends on several things, including your weight, how active you are, which diabetes medicines you take, and what your goals are  for your blood sugar levels. A registered dietitian or diabetes educator can help you plan how much carbohydrate to include in each meal and snack.  A guideline for your daily amount of carbohydrate is:  ?? 45 to 60 grams at each meal. That's about the same as 3 to 4 carbohydrate servings.  ?? 15 to 20 grams at each snack. That's about the same as 1 carbohydrate serving.  The Nutrition Facts label on packaged foods tells you how much carbohydrate is in a serving of the food. First, look at the serving size on the food label. Is that the amount you eat in a serving? All of the nutrition information on a food label is based on that serving size. So if you eat more or less than that, you'll need to adjust the other numbers. Total carbohydrate is the next thing you need to look for on the label. If you count carbohydrate servings, one serving of carbohydrate is 15 grams.  For foods that don't come with labels, such as fresh fruits and vegetables, you'll need a guide that lists carbohydrate in these foods. Ask your doctor, dietitian, or diabetes educator about books or other nutrition guides you can use.  If you take insulin, you need to know how many grams of carbohydrate are in a meal. This lets you know how much rapid-acting insulin to take before you eat. If you use an insulin pump, you get a constant rate of insulin during the day. So the pump must be programmed at meals to give you extra insulin to cover the rise in blood sugar after meals.  When you know how much carbohydrate you will eat, you can take the right amount of insulin. Or, if you always use the same amount of insulin, you need to make sure that you eat the same amount of carbohydrate at meals.  If you need more help to understand carbohydrate counting and food labels, ask your doctor, dietitian, or diabetes educator.    How do you get started with meal planning?  Here are some tips to get started:   ?? Plan your meals a week at a time. Don't forget to include snacks too.  ?? Use cookbooks or online recipes to plan several main meals. Plan some quick meals for busy nights. You also can double some recipes that freeze well. Then you can save half for other busy nights when you don't have time to cook.  ?? Make sure you have the ingredients you need for your recipes. If you're running low on basic items, put these items on your shopping list too.  ?? List foods that you use to make breakfasts, lunches, and snacks. List plenty of fruits and vegetables.  ?? Post this list on the refrigerator. Add to it as you think of more things you need.  ?? Take the list to the store to do your weekly shopping.  Follow-up care is a key part of your treatment and safety. Be sure to make and go to all appointments, and call your doctor if you are having problems. It's also a good idea to know your test results and keep a list of the medicines you take.  Where can you learn more?  Go to [email or web address].  Enter [phone] in the search box to learn more about "Learning About Meal Planning for Diabetes."  Current as of: January 02, 2018  Content Version: 12.2  ?? 2006-2019 [employer]wise, [employer]. Care instructions adapted under license by [employer] (which disclaims liability or warranty for this information). If you have questions about a medical condition or this instruction, always ask your healthcare professional. [place] any warranty or liability for your use of this information.         Learning About Tests When You Have Diabetes  Why do you need regular diabetes tests?    Diabetes can be hard on your body if it's not well controlled. But having tests on a regular schedule can help you and your doctor find problems early, when it's easier to start managing them.  What tests do you need?  The tests you may have, how often you should have them, and the goals of the tests are:   A1c blood test. This test shows the average level of blood sugar over the past 2 to 3 months. It helps your doctor see whether blood sugar levels have been staying within your target range.  ?? How often: Every 3 to 6 months  ?? Goal: A blood sugar level in your target range  Blood pressure test: This test measures the pressure of blood flow in the arteries. Controlling blood pressure can help prevent damage to nerves and blood vessels.  ?? How often: Every 3 to 6 months  ?? Goal: A blood pressure level in your target range  Cholesterol test: This test measures the amount of a type of fat in the blood. It is common for people with diabetes to also have high cholesterol. Too much cholesterol in the blood can build up inside the blood vessels and raise the risk for heart attack and stroke.  ?? How often: At the time of your diabetes diagnosis, and as often as your doctor recommends after that  ?? Goal: A cholesterol level in your target range  Albumin-creatinine ratio test: This test checks for kidney damage by looking for the protein albumin (say "al-BYOO-mun") in the urine. Albumin is normally found in the blood. Kidney damage can let small amounts of it (  microalbumin) leak into the urine.  ?? How often: Once a year  ?? Goal: No protein in the urine  Blood creatinine test/estimated glomerular filtration (eGFR): The blood creatinine (say "kree-AT-uh-neen") level shows how well your kidneys are working. Creatinine is a waste product that muscles release into the blood. Blood creatinine is used to estimate the glomerular filtration rate. A high level of creatinine and/or a low eGFR may mean your kidneys are not working as well as they should.  ?? How often: Once a year  ?? Goal: Normal level of creatinine in the blood. The eGFR goal is greater than 60 mL/min/1.73 m??.  Complete foot exam: The doctor checks for foot sores and whether any sensation has been lost.  ?? How often: Once a year   ?? Goal: Healthy feet with no foot ulcers or loss of feeling  Dental exam and cleaning: The dentist checks for gum disease and tooth decay. People with high blood sugar are more likely to have these problems.  ?? How often: Every 6 months  ?? Goal: Healthy teeth and gums  Complete eye exam: High blood sugar levels can damage the eyes. This exam is done by an ophthalmologist or optometrist. It includes a dilated eye exam. The exam shows whether there's damage to the back of the eye (diabetic retinopathy).  ?? How often: Once a year. If you don't have any signs of diabetic retinopathy, your doctor may recommend an exam every 2 years.  ?? Goal: No damage to the back of the eye  Thyroid-stimulating hormone (TSH) blood test: This test checks for thyroid disease. Too little thyroid hormone can cause some medicines (like insulin) to stay in the body longer. This can cause low blood sugar. You may be tested if you have high cholesterol or are a woman over 64 years old.  ?? How often: As part of your diabetes diagnosis, and as often as your doctor recommends after that  ?? Goal: Normal level of TSH in the blood  Follow-up care is a key part of your treatment and safety. Be sure to make and go to all appointments, and call your doctor if you are having problems. It's also a good idea to know your test results and keep a list of the medicines you take.  Where can you learn more?  Go to [email or web address].  Enter A243 in the search box to learn more about "Learning About Tests When You Have Diabetes."  Current as of: January 02, 2018  Content Version: 12.2  ?? 2006-2019 Healthwise, [employer]. Care instructions adapted under license by [employer] (which disclaims liability or warranty for this information). If you have questions about a medical condition or this instruction, always ask your healthcare professional. [place] any warranty or liability for your use of this  information.         Learning About High Blood Pressure  What is high blood pressure?    Blood pressure is a measure of how hard the blood pushes against the walls of your arteries. It's normal for blood pressure to go up and down throughout the day, but if it stays up, you have high blood pressure. Another name for high blood pressure is hypertension.  Two numbers tell you your blood pressure. The first number is the systolic pressure. It shows how hard the blood pushes when your heart is pumping. The second number is the diastolic pressure. It shows how hard the blood pushes between heartbeats, when your heart is  relaxed and filling with blood.  Your doctor will give you a goal for your blood pressure. Your goal will be based on your health and your age. High blood pressure (hypertension) means that the top number stays high, or the bottom number stays high, or both.  High blood pressure increases the risk of stroke, heart attack, and other problems. You and your doctor will talk about your risks of these problems based on your blood pressure.  What happens when you have high blood pressure?  ?? Blood flows through your arteries with too much force. Over time, this damages the walls of your arteries. But you can't feel it. High blood pressure usually doesn't cause symptoms.  ?? Fat and calcium start to build up in your arteries. This buildup is called plaque. Plaque makes your arteries narrower and stiffer. Blood can't flow through them as easily.  ?? This lack of good blood flow starts to damage some of the organs in your body. This can lead to problems such as coronary artery disease and heart attack, heart failure, stroke, kidney failure, and eye damage.  How can you prevent high blood pressure?  ?? Stay at a healthy weight.  ?? Try to limit how much sodium you eat to less than 2,300 milligrams (mg) a day. If you limit your sodium to 1,500 mg a day, you can lower your blood pressure even more.   ? Buy foods that are labeled "unsalted," "sodium-free," or "low-sodium." Foods labeled "reduced-sodium" and "light sodium" may still have too much sodium.  ? Flavor your food with garlic, lemon juice, onion, vinegar, herbs, and spices instead of salt. Do not use soy sauce, steak sauce, onion salt, garlic salt, mustard, or ketchup on your food.  ? Use less salt (or none) when recipes call for it. You can often use half the salt a recipe calls for without losing flavor.  ?? Be physically active. Get at least 30 minutes of exercise on most days of the week. Walking is a good choice. You also may want to do other activities, such as running, swimming, cycling, or playing tennis or team sports.  ?? Limit alcohol to 2 drinks a day for men and 1 drink a day for women.  ?? Eat plenty of fruits, vegetables, and low-fat dairy products. Eat less saturated and total fats.  How is high blood pressure treated?  ?? Your doctor will suggest making lifestyle changes to help your heart. For example, your doctor may ask you to eat healthy foods, quit smoking, lose extra weight, and be more active.  ?? If lifestyle changes don't help enough, your doctor may recommend that you take medicine.  ?? When blood pressure is very high, medicines are needed to lower it.  Follow-up care is a key part of your treatment and safety. Be sure to make and go to all appointments, and call your doctor if you are having problems. It's also a good idea to know your test results and keep a list of the medicines you take.  Where can you learn more?  Go to [email or web address].  Enter P501 in the search box to learn more about "Learning About High Blood Pressure."  Current as of: December 26, 2017  Content Version: 12.2  ?? 2006-2019 [employer]wise, [employer]. Care instructions adapted under license by [employer] (which disclaims liability or warranty for this information). If you have questions about a medical condition or  this instruction, always ask your healthcare professional. [place] any  warranty or liability for your use of this information.

## 2018-10-31 LAB — LIPID PANEL
Cholesterol, Total: 219 mg/dL — ABNORMAL HIGH (ref 100–199)
Cholesterol, total: 219 mg/dL — ABNORMAL HIGH (ref 100–199)
HDL Cholesterol: 63 mg/dL (ref >39)
HDL: 63 mg/dL (ref >39)
LDL Calculated: 130 mg/dL — ABNORMAL HIGH (ref 0–99)
LDL, calculated: 130 mg/dL — ABNORMAL HIGH (ref 0–99)
Triglyceride: 131 mg/dL (ref 0–149)
Triglycerides: 131 mg/dL (ref 0–149)
VLDL Cholesterol Calculated: 26 mg/dL (ref 5–40)
VLDL, calculated: 26 mg/dL (ref 5–40)

## 2018-10-31 LAB — METABOLIC PANEL, COMPREHENSIVE
A-G Ratio: 2 (ref 1.2–2.2)
ALT (SGPT): 14 IU/L (ref 0–32)
AST (SGOT): 12 IU/L (ref 0–40)
Albumin: 4.5 g/dL (ref 3.8–4.8)
Alk. phosphatase: 134 IU/L — ABNORMAL HIGH (ref 39–117)
BUN/Creatinine ratio: 21 (ref 12–28)
BUN: 19 mg/dL (ref 8–27)
Bilirubin, total: 0.2 mg/dL (ref 0.0–1.2)
CO2: 21 mmol/L (ref 20–29)
Calcium: 10.4 mg/dL — ABNORMAL HIGH (ref 8.7–10.3)
Chloride: 93 mmol/L — ABNORMAL LOW (ref 96–106)
Creatinine: 0.92 mg/dL (ref 0.57–1.00)
GLOBULIN, TOTAL: 2.2 g/dL (ref 1.5–4.5)
Glucose: 334 mg/dL — ABNORMAL HIGH (ref 65–99)
Potassium: 3.8 mmol/L (ref 3.5–5.2)
Protein, total: 6.7 g/dL (ref 6.0–8.5)
Sodium: 133 mmol/L — ABNORMAL LOW (ref 134–144)

## 2018-10-31 LAB — TSH AND FREE T4
T4, Free: 1.46 ng/dL (ref 0.82–1.77)
TSH: 1.03 u[IU]/mL (ref 0.450–4.500)

## 2018-10-31 LAB — COMPREHENSIVE METABOLIC PANEL
ALT: 14 IU/L (ref 0–32)
AST: 12 IU/L (ref 0–40)
Albumin/Globulin Ratio: 2 NA (ref 1.2–2.2)
Albumin: 4.5 g/dL (ref 3.8–4.8)
Alkaline Phosphatase: 134 IU/L — ABNORMAL HIGH (ref 39–117)
BUN/Creatinine Ratio: 21 NA (ref 12–28)
BUN: 19 mg/dL (ref 8–27)
CO2: 21 mmol/L (ref 20–29)
Calcium: 10.4 mg/dL — ABNORMAL HIGH (ref 8.7–10.3)
Chloride: 93 mmol/L — ABNORMAL LOW (ref 96–106)
Creatinine: 0.92 mg/dL (ref 0.57–1.00)
Globulin, Total: 2.2 g/dL (ref 1.5–4.5)
Glucose: 334 mg/dL — ABNORMAL HIGH (ref 65–99)
Potassium: 3.8 mmol/L (ref 3.5–5.2)
Sodium: 133 mmol/L — ABNORMAL LOW (ref 134–144)
Total Bilirubin: 0.2 mg/dL (ref 0.0–1.2)
Total Protein: 6.7 g/dL (ref 6.0–8.5)

## 2018-10-31 LAB — TSH + FREE T4 PANEL
T4 Free: 1.46 ng/dL (ref 0.82–1.77)
TSH: 1.03 u[IU]/mL (ref 0.450–4.500)

## 2018-11-02 NOTE — Telephone Encounter (Signed)
Informed pt of DMV form completion. Form placed up front for pick up. Pt will be by Monday to pickup. Understanding voiced by pt. No further actions required at this time.

## 2018-11-02 NOTE — Telephone Encounter (Signed)
DMV form completed

## 2018-11-06 DIAGNOSIS — J301 Allergic rhinitis due to pollen: Secondary | ICD-10-CM | POA: Diagnosis not present

## 2018-11-06 DIAGNOSIS — J3089 Other allergic rhinitis: Secondary | ICD-10-CM | POA: Diagnosis not present

## 2018-11-07 ENCOUNTER — Ambulatory Visit (INDEPENDENT_AMBULATORY_CARE_PROVIDER_SITE_OTHER): Payer: Medicare Other | Admitting: Orthopaedic Surgery

## 2018-11-07 ENCOUNTER — Encounter (INDEPENDENT_AMBULATORY_CARE_PROVIDER_SITE_OTHER): Payer: Self-pay | Admitting: Orthopaedic Surgery

## 2018-11-07 DIAGNOSIS — M25562 Pain in left knee: Secondary | ICD-10-CM | POA: Diagnosis not present

## 2018-11-07 DIAGNOSIS — M25512 Pain in left shoulder: Secondary | ICD-10-CM

## 2018-11-07 NOTE — Progress Notes (Signed)
The patient comes in for continued follow-up for her left shoulder.  About 6 weeks ago we did provide a steroid injection in the subacromial space.  She says it helped for just a little bit but she still has a lot of aching pain in that shoulder like a toothache.  She denies any injury.  She is 68 years old and does perform yoga several days a week.  She is having difficulty doing those types of things due to shoulder pain.  Is now waking her up at night.  On examination of her left shoulder there is a lot of pain in the subacromial outlet.  The shoulder pops a lot during my exam as well.  There is some slight weakness in the rotator cuff as well.  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  All questions were answered and addressed.  We will see her back in follow-up to go over the MRI.

## 2018-11-08 ENCOUNTER — Other Ambulatory Visit (INDEPENDENT_AMBULATORY_CARE_PROVIDER_SITE_OTHER): Payer: Self-pay

## 2018-11-08 DIAGNOSIS — G8929 Other chronic pain: Secondary | ICD-10-CM

## 2018-11-08 DIAGNOSIS — M25512 Pain in left shoulder: Principal | ICD-10-CM

## 2018-11-13 ENCOUNTER — Ambulatory Visit: Payer: Medicare (Managed Care) | Primary: Family

## 2018-11-13 ENCOUNTER — Inpatient Hospital Stay: Payer: Medicare (Managed Care) | Attending: Family | Primary: Family

## 2018-11-13 DIAGNOSIS — J301 Allergic rhinitis due to pollen: Secondary | ICD-10-CM | POA: Diagnosis not present

## 2018-11-13 DIAGNOSIS — J3089 Other allergic rhinitis: Secondary | ICD-10-CM | POA: Diagnosis not present

## 2018-11-17 ENCOUNTER — Ambulatory Visit (HOSPITAL_BASED_OUTPATIENT_CLINIC_OR_DEPARTMENT_OTHER)
Admission: RE | Admit: 2018-11-17 | Discharge: 2018-11-17 | Disposition: A | Discharge status: Home / Self Care | Payer: Medicare Other | Source: Ambulatory Visit | Attending: Pulmonary Disease | Admitting: Pulmonary Disease

## 2018-11-17 DIAGNOSIS — R0982 Postnasal drip: Secondary | ICD-10-CM

## 2018-11-17 DIAGNOSIS — J471 Bronchiectasis with (acute) exacerbation: Secondary | ICD-10-CM | POA: Insufficient documentation

## 2018-11-17 DIAGNOSIS — J3489 Other specified disorders of nose and nasal sinuses: Secondary | ICD-10-CM | POA: Diagnosis not present

## 2018-11-21 ENCOUNTER — Ambulatory Visit (INDEPENDENT_AMBULATORY_CARE_PROVIDER_SITE_OTHER): Payer: Medicare Other | Admitting: Orthopaedic Surgery

## 2018-11-22 ENCOUNTER — Telehealth: Payer: Self-pay | Admitting: Pulmonary Disease

## 2018-11-22 ENCOUNTER — Ambulatory Visit
Admission: RE | Admit: 2018-11-22 | Discharge: 2018-11-22 | Disposition: A | Discharge status: Home / Self Care | Payer: Medicare Other | Source: Ambulatory Visit | Attending: Orthopaedic Surgery | Admitting: Orthopaedic Surgery

## 2018-11-22 DIAGNOSIS — M25512 Pain in left shoulder: Principal | ICD-10-CM

## 2018-11-22 DIAGNOSIS — G8929 Other chronic pain: Secondary | ICD-10-CM

## 2018-11-22 DIAGNOSIS — M19012 Primary osteoarthritis, left shoulder: Secondary | ICD-10-CM | POA: Diagnosis not present

## 2018-11-22 NOTE — Telephone Encounter (Signed)
ATC Patient.  Left message on machine with Sherry's direct number, [phone].  Will route message to [facility] as Juluis Rainier

## 2018-11-23 NOTE — Telephone Encounter (Signed)
Pt is calling back [phone] or [phone]

## 2018-11-23 NOTE — Telephone Encounter (Signed)
I was not expecting call back from pt.  Checked chart & Cherina called pt & left vm yesterday for her to call back regarding CT results.

## 2018-11-23 NOTE — Telephone Encounter (Signed)
Called patient unable to reach left message to give us a call back.

## 2018-11-23 NOTE — Progress Notes (Signed)
Spoke with pt and notified of results per Dr. Alva. Pt verbalized understanding and denied any questions. 

## 2018-11-23 NOTE — Telephone Encounter (Signed)
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 Elsworth Soho. Pt verbalized understanding and denied any questions.

## 2018-11-26 ENCOUNTER — Ambulatory Visit (INDEPENDENT_AMBULATORY_CARE_PROVIDER_SITE_OTHER): Payer: Medicare Other | Admitting: Orthopaedic Surgery

## 2018-11-27 ENCOUNTER — Encounter: Attending: Family | Primary: Family

## 2018-12-03 ENCOUNTER — Ambulatory Visit (INDEPENDENT_AMBULATORY_CARE_PROVIDER_SITE_OTHER): Payer: Medicare Other | Admitting: Physician Assistant

## 2018-12-04 ENCOUNTER — Ambulatory Visit: Payer: Medicare (Managed Care) | Primary: Family

## 2018-12-04 DIAGNOSIS — J301 Allergic rhinitis due to pollen: Secondary | ICD-10-CM | POA: Diagnosis not present

## 2018-12-04 DIAGNOSIS — J3089 Other allergic rhinitis: Secondary | ICD-10-CM | POA: Diagnosis not present

## 2018-12-06 ENCOUNTER — Encounter

## 2018-12-06 NOTE — Telephone Encounter (Signed)
Last visit 10/30/2018 NP Ermalinda Barrios   Next appointment 3 months   Previous refill encounter(s) 10/12/2018 Norvasc #90     Requested Prescriptions     Pending Prescriptions Disp Refills   ??? amLODIPine (NORVASC) 10 mg tablet 90 Tab 0     Sig: Take 1 Tab by mouth daily.

## 2018-12-07 MED ORDER — AMLODIPINE 10 MG TAB
10 mg | ORAL_TABLET | Freq: Every day | ORAL | 0 refills | Status: DC
Start: 2018-12-07 — End: 2019-04-12

## 2018-12-11 NOTE — Telephone Encounter (Signed)
Ok to do at a later date

## 2018-12-11 NOTE — Telephone Encounter (Signed)
[facility] called to find out if the patient's order for a lower extremities test is medically necessary right now or can it be pushed back to a later date.    Please call back at [phone].

## 2018-12-12 NOTE — Telephone Encounter (Signed)
Spoke with Glenda from scheduling and informed that it was ok to do testing at later date. Understanding voiced by [facility]. No further actions required at this time.

## 2018-12-31 DIAGNOSIS — J301 Allergic rhinitis due to pollen: Secondary | ICD-10-CM | POA: Diagnosis not present

## 2018-12-31 DIAGNOSIS — J3089 Other allergic rhinitis: Secondary | ICD-10-CM | POA: Diagnosis not present

## 2019-01-04 ENCOUNTER — Telehealth: Admit: 2019-01-04 | Discharge: 2019-01-04 | Payer: MEDICARE | Attending: Family | Primary: Family

## 2019-01-04 ENCOUNTER — Encounter: Attending: Family | Primary: Family

## 2019-01-04 ENCOUNTER — Telehealth: Attending: Family | Primary: Family

## 2019-01-04 DIAGNOSIS — I10 Essential (primary) hypertension: Secondary | ICD-10-CM
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
## 2019-01-10 ENCOUNTER — Ambulatory Visit: Payer: Medicare Other | Admitting: Pulmonary Disease

## 2019-01-14 ENCOUNTER — Ambulatory Visit: Payer: Medicare (Managed Care) | Primary: Family

## 2019-01-14 ENCOUNTER — Encounter

## 2019-01-14 MED ORDER — VALSARTAN-HYDROCHLOROTHIAZIDE 160 MG-25 MG TAB
160-25 mg | ORAL_TABLET | ORAL | 0 refills | Status: DC
Start: 2019-01-14 — End: 2019-04-15

## 2019-01-15 DIAGNOSIS — J3089 Other allergic rhinitis: Secondary | ICD-10-CM | POA: Diagnosis not present

## 2019-01-15 DIAGNOSIS — J301 Allergic rhinitis due to pollen: Secondary | ICD-10-CM | POA: Diagnosis not present

## 2019-01-16 NOTE — Telephone Encounter (Signed)
DMV paperwork faxed to [phone] and [phone]  Confirmation received. Document placed in bin for centralized scanning.

## 2019-01-22 ENCOUNTER — Ambulatory Visit: Payer: Medicare Other | Admitting: Pulmonary Disease

## 2019-01-31 ENCOUNTER — Other Ambulatory Visit: Payer: Self-pay

## 2019-01-31 ENCOUNTER — Encounter: Payer: Self-pay | Admitting: Orthopaedic Surgery

## 2019-01-31 ENCOUNTER — Ambulatory Visit (INDEPENDENT_AMBULATORY_CARE_PROVIDER_SITE_OTHER): Payer: Medicare Other | Admitting: Orthopaedic Surgery

## 2019-01-31 DIAGNOSIS — M25512 Pain in left shoulder: Secondary | ICD-10-CM

## 2019-01-31 DIAGNOSIS — G8929 Other chronic pain: Secondary | ICD-10-CM | POA: Diagnosis not present

## 2019-01-31 DIAGNOSIS — M7542 Impingement syndrome of left shoulder: Secondary | ICD-10-CM

## 2019-01-31 MED ORDER — METHYLPREDNISOLONE ACETATE 40 MG/ML IJ SUSP
40.0000 mg | INTRAMUSCULAR | Status: AC | PRN
  Administered 2019-01-31: 40 mg via INTRA_ARTICULAR

## 2019-01-31 MED ORDER — LIDOCAINE HCL 1 % IJ SOLN
3.0000 mL | INTRAMUSCULAR | Status: AC | PRN
  Administered 2019-01-31: 3 mL

## 2019-01-31 MED ORDER — TRAMADOL HCL 50 MG PO TABS: 50.0000 mg | ORAL_TABLET | Freq: Four times a day (QID) | ORAL | 0 refills | Status: DC | PRN

## 2019-01-31 MED ORDER — NABUMETONE 500 MG PO TABS: 500.0000 mg | ORAL_TABLET | Freq: Two times a day (BID) | ORAL | 1 refills | Status: DC | PRN

## 2019-01-31 NOTE — Progress Notes (Signed)
Office Visit Note   Patient: Alyssa Elliott           Date of Birth: 27-Mar-1951           MRN: [id number] Visit Date: 01/31/2019              Requested by: Prince Solian, MD [address] [place], [place] [address] PCP: Prince Solian, MD   Assessment & Plan: Visit Diagnoses:  1. Chronic left shoulder pain   2. Impingement syndrome of left shoulder     Plan: I do feel it is worth a second subacromial steroid injection as well as outpatient physical therapy on her left shoulder to see if we can get her feeling better with out a surgical intervention.  She agreed to this and tolerated the injection well the beginning but then she got lightheaded at the checkout window.  She never passed out fully.  We got her into the exam room and her blood sugar was running in the 80s and her vitals look good.  She then was able to do well after some water and appeared to rest in the office and went home feeling fine.  We will see her back in 4 weeks after course of therapy as well as sending in some tramadol and Relafen.  All question concerns were answered addressed.  Follow-Up Instructions: Return in about 4 weeks (around 02/28/2019).   Orders:  No orders of the defined types were placed in this encounter.  No orders of the defined types were placed in this encounter.     Procedures: Large Joint Inj: L subacromial bursa on 01/31/2019 1:15 PM Indications: pain and diagnostic evaluation Details: 22 G 1.5 in needle  Arthrogram: No  Medications: 3 mL lidocaine 1 %; 40 mg methylPREDNISolone acetate 40 MG/ML Outcome: tolerated well, no immediate complications Procedure, treatment alternatives, risks and benefits explained, specific risks discussed. Consent was given by the patient. Immediately prior to procedure a time out was called to verify the correct patient, procedure, equipment, support staff and site/side marked as required. Patient was prepped and draped in the usual sterile  fashion.       Clinical Data: No additional findings.   Subjective: Chief Complaint  Patient presents with   Left Shoulder - Follow-up  The patient is well-known to me.  She is been dealing with left shoulder pain and symptoms of severe impingement for several months now.  It is gotten to where her pain is waking her up at night and she can even at sometimes get sleep well.  She has had at least 1 subacromial steroid injection and this did not help her much at all.  She is concerned about the possibility of a rotator cuff tear.  We sent her for an MRI in early March.  We are finally seeing her now due to delays in seeing patients as it relates to the coronavirus pandemic.  She reports to say a lot of pain in her left shoulder difficulty with overhead activities and reaching behind her.  She denies any specific injuries.  She denies any neck pain.  The pain in her shoulder radiates into the trapezius area and down in the subdeltoid area.  The remainder of her left upper extremity shows no issues from the elbow to her hand.  She does take meloxicam and Tylenol on occasion.  HPI  Review of Systems She currently denies any headache, chest pain, shortness of breath, fever, chills, nausea, vomiting  Objective: Vital Signs: LMP  09/04/2002 (Exact Date)   Physical Exam She is alert and orient x3 and in no acute distress Ortho Exam Examination of her left shoulder does still show signs of impingement syndrome with Neer and Hawkins signs and difficulty with abducting her shoulder past 90 degrees without severe pain on the left side. Specialty Comments:  No specialty comments available.  Imaging: No results found. The MRI is reviewed with her of her left shoulder.  There is no gross tear of the rotator cuff no muscle atrophy.  There is some slight degenerative signal changes at the insertion of the supraspinatus but no tear.  There is a mild amount of synovitis in the glenohumeral joint area  and mild AC joint arthritic changes but no gross deformities.  The cartilage is intact.  PMFS History: Patient Active Problem List   Diagnosis Date Noted   Lung nodule seen on imaging study 11/12/2012   Maxillary sinusitis, chronic 10/27/2012   Hemorrhoids 05/04/2012   Thrombosed external hemorrhoid 05/04/2012   Seasonal and perennial allergic rhinitis 10/16/2011   Asthma with bronchitis 10/16/2011   GERD (gastroesophageal reflux disease) 10/16/2011   Past Medical History:  Diagnosis Date   Allergy    Anxiety    Asthma    Cataract 2018   removed   DDD (degenerative disc disease)    Depression    GERD (gastroesophageal reflux disease)    Hemorrhoids    Hyperlipidemia    Vitamin D deficiency     Family History  Problem Relation Age of Onset   Heart disease Mother    Diabetes Mother    Tuberculosis Paternal Grandfather    Bone cancer Paternal Grandfather    Breast cancer Sister 34   Melanoma Sister    Cirrhosis Maternal Grandfather    Heart failure Paternal Grandmother    Colon cancer Neg Hx     Past Surgical History:  Procedure Laterality Date   ATRIAL ABLATION SURGERY  1993   SVT   cataract surgery     FOOT NEUROMA SURGERY     multiple times bilaterally   PLANTAR FASCIA RELEASE     Social History   Occupational History   Occupation: retired Catering manager  Tobacco Use   Smoking status: Never Smoker   Smokeless tobacco: Never Used  Substance and Sexual Activity   Alcohol use: No   Drug use: No   Sexual activity: Not Currently    Partners: Male    Birth control/protection: Post-menopausal

## 2019-02-04 DIAGNOSIS — M25512 Pain in left shoulder: Secondary | ICD-10-CM | POA: Diagnosis not present

## 2019-02-06 DIAGNOSIS — J4530 Mild persistent asthma, uncomplicated: Secondary | ICD-10-CM | POA: Diagnosis not present

## 2019-02-06 DIAGNOSIS — H1045 Other chronic allergic conjunctivitis: Secondary | ICD-10-CM | POA: Diagnosis not present

## 2019-02-06 DIAGNOSIS — M25512 Pain in left shoulder: Secondary | ICD-10-CM | POA: Diagnosis not present

## 2019-02-06 DIAGNOSIS — J3089 Other allergic rhinitis: Secondary | ICD-10-CM | POA: Diagnosis not present

## 2019-02-06 DIAGNOSIS — J301 Allergic rhinitis due to pollen: Secondary | ICD-10-CM | POA: Diagnosis not present

## 2019-02-13 DIAGNOSIS — M25512 Pain in left shoulder: Secondary | ICD-10-CM | POA: Diagnosis not present

## 2019-02-14 DIAGNOSIS — L719 Rosacea, unspecified: Secondary | ICD-10-CM | POA: Diagnosis not present

## 2019-02-18 ENCOUNTER — Ambulatory Visit: Payer: Medicare (Managed Care) | Primary: Family

## 2019-02-21 DIAGNOSIS — M25512 Pain in left shoulder: Secondary | ICD-10-CM | POA: Diagnosis not present

## 2019-02-25 DIAGNOSIS — M25512 Pain in left shoulder: Secondary | ICD-10-CM | POA: Diagnosis not present

## 2019-02-27 DIAGNOSIS — M25512 Pain in left shoulder: Secondary | ICD-10-CM | POA: Diagnosis not present

## 2019-02-28 ENCOUNTER — Other Ambulatory Visit: Payer: Self-pay

## 2019-02-28 ENCOUNTER — Ambulatory Visit (INDEPENDENT_AMBULATORY_CARE_PROVIDER_SITE_OTHER): Payer: Medicare Other | Admitting: Orthopaedic Surgery

## 2019-02-28 ENCOUNTER — Encounter: Payer: Self-pay | Admitting: Orthopaedic Surgery

## 2019-02-28 DIAGNOSIS — G8929 Other chronic pain: Secondary | ICD-10-CM

## 2019-02-28 DIAGNOSIS — M25512 Pain in left shoulder: Secondary | ICD-10-CM | POA: Diagnosis not present

## 2019-02-28 DIAGNOSIS — M7542 Impingement syndrome of left shoulder: Secondary | ICD-10-CM | POA: Diagnosis not present

## 2019-02-28 NOTE — Progress Notes (Signed)
The patient comes in today with continued left shoulder pain.  She is a very active 68 year old female and she has been going to physical therapy.  She cannot tolerate the dry needling.  In March of this year we did obtain an MRI of her left shoulder that showed some mild glenohumeral arthritic changes with some reactive edema in the posterior glenoid but no cartilage abnormalities.  The rotator cuff was intact and she only showed some mild tendinitis.  On exam she has a significant problem with mobility of that shoulder to the point where I do feel that she is developing a frozen shoulder.  It is essential that we get her into Dr. Junius Roads tomorrow for an intra-articular injection into her left shoulder glenohumeral joint under ultrasound.  She will then continue her therapy next week.  I will see her back in about 4 weeks to see how she is doing overall.  She understands the rationale behind trying a glenohumeral injection.

## 2019-03-01 ENCOUNTER — Ambulatory Visit (INDEPENDENT_AMBULATORY_CARE_PROVIDER_SITE_OTHER): Payer: Medicare Other | Admitting: Family Medicine

## 2019-03-01 ENCOUNTER — Encounter: Payer: Self-pay | Admitting: Family Medicine

## 2019-03-01 DIAGNOSIS — G8929 Other chronic pain: Secondary | ICD-10-CM | POA: Diagnosis not present

## 2019-03-01 DIAGNOSIS — M25512 Pain in left shoulder: Secondary | ICD-10-CM | POA: Diagnosis not present

## 2019-03-01 NOTE — Progress Notes (Signed)
Subjective: Patient is here for ultrasound-guided intra-articular left glenohumeral injection.  Objective: She has pain and decreased range of motion with overhead reach and behind the back reach.  Procedure: Ultrasound-guided left glenohumeral injection: After sterile prep with Betadine, injected 8 cc 1% lidocaine without epinephrine and 40 mg methylprednisolone using  22-gauge spinal needle, passing the needle through approach into the glenohumeral joint.  Dictate was seen filling the joint capsule.  She had good pain relief during the immediate anesthetic phase.  She will follow-up with Dr. Ninfa Linden as directed.

## 2019-03-05 DIAGNOSIS — M25512 Pain in left shoulder: Secondary | ICD-10-CM | POA: Diagnosis not present

## 2019-03-07 DIAGNOSIS — M25512 Pain in left shoulder: Secondary | ICD-10-CM | POA: Diagnosis not present

## 2019-03-08 DIAGNOSIS — J301 Allergic rhinitis due to pollen: Secondary | ICD-10-CM | POA: Diagnosis not present

## 2019-03-08 DIAGNOSIS — J3089 Other allergic rhinitis: Secondary | ICD-10-CM | POA: Diagnosis not present

## 2019-03-13 DIAGNOSIS — M25512 Pain in left shoulder: Secondary | ICD-10-CM | POA: Diagnosis not present

## 2019-03-14 DIAGNOSIS — J301 Allergic rhinitis due to pollen: Secondary | ICD-10-CM | POA: Diagnosis not present

## 2019-03-15 ENCOUNTER — Encounter

## 2019-03-15 MED ORDER — METFORMIN SR 500 MG 24 HR TABLET
500 mg | ORAL_TABLET | ORAL | 0 refills | Status: DC
Start: 2019-03-15 — End: 2019-06-19

## 2019-03-18 ENCOUNTER — Ambulatory Visit: Payer: Medicare (Managed Care) | Primary: Family

## 2019-03-18 DIAGNOSIS — J3089 Other allergic rhinitis: Secondary | ICD-10-CM | POA: Diagnosis not present

## 2019-03-18 DIAGNOSIS — J301 Allergic rhinitis due to pollen: Secondary | ICD-10-CM | POA: Diagnosis not present

## 2019-03-19 DIAGNOSIS — M25512 Pain in left shoulder: Secondary | ICD-10-CM | POA: Diagnosis not present

## 2019-03-21 DIAGNOSIS — E7849 Other hyperlipidemia: Secondary | ICD-10-CM | POA: Diagnosis not present

## 2019-03-21 DIAGNOSIS — M25512 Pain in left shoulder: Secondary | ICD-10-CM | POA: Diagnosis not present

## 2019-03-25 DIAGNOSIS — E559 Vitamin D deficiency, unspecified: Secondary | ICD-10-CM | POA: Diagnosis not present

## 2019-03-25 DIAGNOSIS — F329 Major depressive disorder, single episode, unspecified: Secondary | ICD-10-CM | POA: Diagnosis not present

## 2019-03-25 DIAGNOSIS — K219 Gastro-esophageal reflux disease without esophagitis: Secondary | ICD-10-CM | POA: Diagnosis not present

## 2019-03-25 DIAGNOSIS — E785 Hyperlipidemia, unspecified: Secondary | ICD-10-CM | POA: Diagnosis not present

## 2019-03-25 DIAGNOSIS — M25512 Pain in left shoulder: Secondary | ICD-10-CM | POA: Diagnosis not present

## 2019-03-25 DIAGNOSIS — J45909 Unspecified asthma, uncomplicated: Secondary | ICD-10-CM | POA: Diagnosis not present

## 2019-03-25 DIAGNOSIS — Z1331 Encounter for screening for depression: Secondary | ICD-10-CM | POA: Diagnosis not present

## 2019-03-27 DIAGNOSIS — M25512 Pain in left shoulder: Secondary | ICD-10-CM | POA: Diagnosis not present

## 2019-04-01 ENCOUNTER — Ambulatory Visit (INDEPENDENT_AMBULATORY_CARE_PROVIDER_SITE_OTHER): Payer: Medicare Other | Admitting: Orthopaedic Surgery

## 2019-04-01 ENCOUNTER — Encounter: Payer: Self-pay | Admitting: Orthopaedic Surgery

## 2019-04-01 ENCOUNTER — Other Ambulatory Visit: Payer: Self-pay

## 2019-04-01 DIAGNOSIS — M7502 Adhesive capsulitis of left shoulder: Secondary | ICD-10-CM | POA: Diagnosis not present

## 2019-04-01 DIAGNOSIS — G8929 Other chronic pain: Secondary | ICD-10-CM | POA: Diagnosis not present

## 2019-04-01 DIAGNOSIS — M25512 Pain in left shoulder: Secondary | ICD-10-CM

## 2019-04-01 NOTE — Progress Notes (Signed)
The patient is someone we have been seeing for now over 6 months.  She has significant left shoulder pain with impingement syndrome and is developed a frozen shoulder.  She is tried to subacromial steroid injections as well as one intra-articular steroid injection in the left shoulder joint.  She is been through extensive physical therapy as well.  An MRI of her left shoulder showed signs of adhesive capsulitis with no tearing of the tendons but definitely tendinosis of the rotator cuff and signs of impingement.  She feels like she is making some progress with motion of her shoulder but her pain is still significant.  At this point her decreased left shoulder function and motion is detrimentally affecting her mobility and her quality of life as well as her actives daily living.  On examination she still lacks full shoulder abduction and full forward flexion by a significant amount.  Her external rotation is limited.  Her internal rotation with adduction is only to the low back.  All this is with significant pain.  This point we are recommending a manipulation under anesthesia of her left shoulder with an arthroscopy and debridement.  She does have bronchiectasis so I would like to have her stay overnight for observation.  We would want her in physical therapy at least the day after surgery and into the next week to work on mobility of her shoulder.  I had a long and thorough discussion about surgery.  We talked in detail about our goals of surgery as well as the risk and benefits involved in significant detail.  Given the failure of conservative treatment for now well over 6 months and the fact that she is now continuing to have pain at night and worsening function, we feel that surgery is warranted and recommended.  All question concerns were answered and addressed.  We will work on getting this scheduled soon.  We will see her back at 1 week postoperative.

## 2019-04-04 DIAGNOSIS — J301 Allergic rhinitis due to pollen: Secondary | ICD-10-CM | POA: Diagnosis not present

## 2019-04-04 DIAGNOSIS — J3089 Other allergic rhinitis: Secondary | ICD-10-CM | POA: Diagnosis not present

## 2019-04-08 DIAGNOSIS — J3089 Other allergic rhinitis: Secondary | ICD-10-CM | POA: Diagnosis not present

## 2019-04-11 ENCOUNTER — Telehealth: Admit: 2019-04-11 | Payer: PRIVATE HEALTH INSURANCE | Attending: Family | Primary: Family

## 2019-04-11 NOTE — Progress Notes (Signed)
This encounter was created in error - please disregard.

## 2019-04-11 NOTE — Progress Notes (Signed)
This encounter was created in error - please disregard.

## 2019-04-12 ENCOUNTER — Ambulatory Visit: Admit: 2019-04-12 | Discharge: 2019-04-12 | Payer: PRIVATE HEALTH INSURANCE | Attending: Family | Primary: Family

## 2019-04-12 ENCOUNTER — Ambulatory Visit: Attending: Family | Primary: Family

## 2019-04-12 DIAGNOSIS — M79676 Pain in unspecified toe(s): Secondary | ICD-10-CM

## 2019-04-12 LAB — AMB POC HEMOGLOBIN A1C
Hemoglobin A1C, POC: 11 % — AB (ref 4.8–5.6)
Hemoglobin A1c (POC): 11 % — AB (ref 4.8–5.6)

## 2019-04-12 MED ORDER — ACETAMINOPHEN-CODEINE 300 MG-30 MG TAB
300-30 mg | ORAL_TABLET | ORAL | 0 refills | Status: AC | PRN
Start: 2019-04-12 — End: 2019-04-15

## 2019-04-12 MED ORDER — AMOXICILLIN CLAVULANATE 875 MG-125 MG TAB
875-125 mg | ORAL_TABLET | Freq: Two times a day (BID) | ORAL | 0 refills | Status: AC
Start: 2019-04-12 — End: 2019-04-22

## 2019-04-12 MED ORDER — AMLODIPINE 10 MG TAB
10 mg | ORAL_TABLET | Freq: Every day | ORAL | 1 refills | Status: DC
Start: 2019-04-12 — End: 2019-10-14

## 2019-04-12 NOTE — Progress Notes (Signed)
Rm#[id number]    Chief Complaint   Patient presents with   ??? Toe Pain     right foot great toe pain around nail. x2 weeks getting worse.  swollen, drainage, red     1. Have you been to the ER, urgent care clinic since your last visit?  Hospitalized since your last visit?No    2. Have you seen or consulted any other health care providers outside of the [facility] since your last visit?  Include any pap smears or colon screening. Yes [facility] Dr. Verdene Lennert , 04-05-19   Health Maintenance Due   Topic Date Due   ??? Eye Exam Retinal or Dilated  02/01/1959   ??? DTaP/Tdap/Td series (1 - Tdap) 01/31/1970   ??? Shingrix Vaccine Age 70> (1 of 2) 02/01/1999   ??? FOBT Q1Y Age 70-75  02/01/1999   ??? GLAUCOMA SCREENING Q2Y  01/31/2014   ??? Breast Cancer Screen Mammogram  03/03/2016   ??? Medicare Yearly Exam  02/24/2018   ??? A1C test (Diabetic or Prediabetic)  01/28/2019   ??? MICROALBUMIN Q1  02/24/2019     3 most recent PHQ Screens 04/12/2019   Little interest or pleasure in doing things Not at all   Feeling down, depressed, irritable, or hopeless Not at all   Total Score PHQ 2 0     Recent Travel Screening and Travel History documentation     Travel Screening     Question   Response    In the last month, have you been in contact with someone who was confirmed or suspected to have Coronavirus / COVID-19?  No / Unsure    Do you have any of the following symptoms?  None of these    Have you traveled internationally in the last month?  No      Travel History   Travel since 03/13/19     No documented travel since 03/13/19

## 2019-04-12 NOTE — Progress Notes (Signed)
Progress  Notes by Karsten Fells, NP at 04/12/19 0830                Author: Karsten Fells, NP  Service: --  Author Type: Nurse Practitioner       Filed: 04/12/19 [id number]  Encounter Date: 04/12/2019  Status: Signed          Editor: Karsten Fells, NP (Nurse Practitioner)               Subjective   Lindsey Huynh is a 68 y.o.  female.   HPI    She has not been taking Metformin since Insulin started, did not know she should    Fasting blood sugar 140's  or less       Off Amlodipine for > 1 week. Did not get from pharmacy      Bilateral foot numbness tingling and pain with first steps. Normal ABI 2017, duplex doppler ordered, delayed d/t pandemic, minimal improvement with Gabapentin    Sees Dr. Verdene Lennert, chiropractor,  three days a week, receives 3 injection, 3 times weekly in each foot.    Developed right great toe pain and swelling, drainage from around medial nail   Chiropractor took xray of right foot this Monday, was advised this was normal   She has been applying  neosporin to periungual area    Denies injury or history of similar symptoms         Past Medical History:        Diagnosis  Date         ?  Diabetes ([place])       ?  GERD (gastroesophageal reflux disease)       ?  Hypercholesterolemia           ?  Hypertension               Current Outpatient Medications        Medication  Sig         ?  amLODIPine (NORVASC) 10 mg tablet  Take 1 Tab by mouth daily.     ?  amoxicillin-clavulanate (AUGMENTIN) 875-125 mg per tablet  Take 1 Tab by mouth two (2) times a day for 10 days.     ?  acetaminophen-codeine (Tylenol-Codeine #3) 300-30 mg per tablet  Take 1 Tab by mouth every four (4) hours as needed for Pain for up to 3 days. Max Daily Amount: 6 Tabs.     ?  valsartan-hydroCHLOROthiazide (DIOVAN-HCT) 160-25 mg per tablet  TAKE 1 TABLET BY MOUTH EVERY DAY     ?  insulin detemir U-100 (LEVEMIR FLEXTOUCH U-100 INSULN) 100 unit/mL (3 mL) inpn  20 Units by SubCUTAneous route daily (with breakfast).         ?  gabapentin  (NEURONTIN) 300 mg capsule  Take 1 Cap by mouth nightly. Max Daily Amount: 300 mg.         ?  cyclopentolate (CYCLOGYL) 1 % ophthalmic solution       ?  Insulin Needles, Disposable, (NANO PEN NEEDLE) 32 gauge x 5/32" ndle  Use with insulin pen 3 times daily     ?  aspirin delayed-release 81 mg tablet  Take 81 mg by mouth daily.     ?  omeprazole (PRILOSEC) 20 mg capsule  Take 20 mg by mouth daily.         ?  metFORMIN ER (GLUCOPHAGE XR) 500 mg tablet  TAKE 2 TABLETS BY MOUTH EVERY DAY  WITH DINNER          No current facility-administered medications for this visit.              Allergies        Allergen  Reactions         ?  Neuromuscular Blockers, Steroidal  Other (comments)             GI Upset             Past Surgical History:         Procedure  Laterality  Date          ?  HX COLONOSCOPY         ?  HX ORTHOPAEDIC    06-30-10          back surgery (tumor removed)          ?  HX TUMOR REMOVAL    06/30/10          under spinal cord        Review of Systems    Constitutional: Negative for chills and fever.    Eyes: Negative for blurred vision.    Respiratory: Negative.     Cardiovascular: Negative for chest pain and palpitations.    Gastrointestinal: Negative.     Genitourinary: Negative.     Musculoskeletal: Positive for myalgias.          Objective   Visit Vitals      BP  156/80 (BP 1 Location: Right arm, BP Patient Position: Sitting)     Pulse  69     Temp  98.4 ??F (36.9 ??C) (Oral)     Resp  16     Ht  5\' 5"  (1.651 m)     Wt  180 lb (81.6 kg)     SpO2  99%        BMI  29.95 kg/m??        Physical Exam    Musculoskeletal:      Right foot: Decreased range of motion.    Feet:       Comments: Toe nails painted  Scant, serous drainage on dressing. No active drainage  Right great toe and dorsum of foot edematous, tender  1st web space macerated  Exam of toe  limited d/t pain             Skin:      General: Skin is warm and dry.                      Assessment & Plan             ICD-10-CM  ICD-9-CM             1.   Periungual pain of foot   M79.676  729.5  amoxicillin-clavulanate (AUGMENTIN) 875-125 mg per tablet                acetaminophen-codeine (Tylenol-Codeine #3) 300-30 mg per tablet           2.  Essential hypertension   I10  401.9  amLODIPine (NORVASC) 10 mg tablet     3.  Uncontrolled type 2 diabetes mellitus with hyperglycemia (HCC)   E11.65  250.02  AMB POC HEMOGLOBIN A1C     4.  Pyonychia of finger of right hand   L03.011  681.9  amoxicillin-clavulanate (AUGMENTIN) 875-125 mg per tablet  REFERRAL TO PODIATRY           5.  Paronychia of toe of right foot   L03.031  681.11  REFERRAL TO PODIATRY          Follow-up and Dispositions      ??  Return in about 1 week (around 04/19/2019) for diabetes, htn, toe infection .          discussed likely ingrown nail vs fungal infection as cause of toe infection. Strongly encouraged podiatry referral.    Elevate legs, clean toe with 1/2 soap and water or 1/2 strength H2O2. Stop neosporin.    Discouraged foot injections by chiropractor       Hemoglobin A1c 11%. Advised to restart Metformin, check 2 hour post prandial blood sugar   HTN uncontrolled. Restart Amlodipine 10 mg    Take Gabapentin 2-3 times daily      lab results and schedule of future lab studies reviewed with patient   reviewed medications and side effects in detail   specific diabetic recommendations: home glucose monitoring emphasized, glycohemoglobin and other lab monitoring discussed  and long term diabetic complications discussed       Patient voices understanding and acceptance of this advice and will call back if any further questions or concerns.   Karsten Fells, NP

## 2019-04-12 NOTE — Progress Notes (Signed)
Progress Notes by Lavena Stanford, LPN at 18/84/16 6063                Author: Lavena Stanford, LPN  Service: --  Author Type: Licensed Nurse       Filed: 04/12/19 0954  Encounter Date: 04/12/2019  Status: Signed          Editor: Lavena Stanford, LPN (Licensed Nurse)               Rm#[id number]        Chief Complaint       Patient presents with        ?  Toe Pain             right foot great toe pain around nail. x2 weeks getting worse.  swollen, drainage, red        1. Have you been to the ER, urgent care clinic since your last visit?  Hospitalized since your last visit?No      2. Have you seen or consulted any other health care providers outside of the [facility] since your last visit?  Include any pap smears or colon screening. Yes [facility] Dr. Verdene Lennert , 04-05-19      Health Maintenance Due        Topic  Date Due         ?  Eye Exam Retinal or Dilated   02/01/1959     ?  DTaP/Tdap/Td series (1 - Tdap)  01/31/1970     ?  Shingrix Vaccine Age 28> (1 of 2)  02/01/1999     ?  FOBT Q1Y Age 9-75   02/01/1999     ?  GLAUCOMA SCREENING Q2Y   01/31/2014     ?  Breast Cancer Screen Mammogram   03/03/2016     ?  Medicare Yearly Exam   02/24/2018     ?  A1C test (Diabetic or Prediabetic)   01/28/2019         ?  MICROALBUMIN Q1   02/24/2019           3 most recent PHQ Screens  04/12/2019        Little interest or pleasure in doing things  Not at all     Feeling down, depressed, irritable, or hopeless  Not at all        Total Score PHQ 2  0        Recent Travel Screening and Travel History documentation           Travel Screening             Question     Response            In the last month, have you been in contact with someone who was confirmed or suspected to have Coronavirus / COVID-19?    No / Unsure       Do you have any of the following symptoms?    None of these            Have you traveled internationally in the last month?    No             Travel History    Travel since  03/13/19           No documented travel since 03/13/19

## 2019-04-12 NOTE — Patient Instructions (Addendum)
Restart Amlodipine 10 mg   Restart Metformin 500 mg 2 tablet with dinner  Increase Gabapentin to 2 or 3 times daily  Schedule a ppointment with Podiatry  Follow up with me next week if not able to get in with Podiatrist next week  Take antibiotic as prescribed for toe infection        Learning About Diabetes Food Guidelines  Your Care Instructions     Meal planning is important to manage diabetes. It helps keep your blood sugar at a target level (which you set with your doctor). You don't have to eat special foods. You can eat what your family eats, including sweets once in a while. But you do have to pay attention to how often you eat and how much you eat of certain foods.  You may want to work with a dietitian or a certified diabetes educator (CDE) to help you plan meals and snacks. A dietitian or CDE can also help you lose weight if that is one of your goals.  What should you know about eating carbs?  Managing the amount of carbohydrate (carbs) you eat is an important part of healthy meals when you have diabetes. Carbohydrate is found in many foods.  ?? Learn which foods have carbs. And learn the amounts of carbs in different foods.  ? Bread, cereal, pasta, and rice have about 15 grams of carbs in a serving. A serving is 1 slice of bread (1 ounce), ?? cup of cooked cereal, or 1/3 cup of cooked pasta or rice.  ? Fruits have 15 grams of carbs in a serving. A serving is 1 small fresh fruit, such as an apple or orange; ?? of a banana; ?? cup of cooked or canned fruit; ?? cup of fruit juice; 1 cup of melon or raspberries; or 2 tablespoons of dried fruit.  ? Milk and no-sugar-added yogurt have 15 grams of carbs in a serving. A serving is 1 cup of milk or 2/3 cup of no-sugar-added yogurt.  ? Starchy vegetables have 15 grams of carbs in a serving. A serving is ?? cup of mashed potatoes or sweet potato; 1 cup winter squash; ?? of a small baked potato; ?? cup of cooked beans; or ?? cup cooked corn or green peas.   ?? Learn how much carbs to eat each day and at each meal. A dietitian or CDE can teach you how to keep track of the amount of carbs you eat. This is called carbohydrate counting.  ?? If you are not sure how to count carbohydrate grams, use the Plate Method to plan meals. It is a good, quick way to make sure that you have a balanced meal. It also helps you spread carbs throughout the day.  ? Divide your plate by types of foods. Put non-starchy vegetables on half the plate, meat or other protein food on one-quarter of the plate, and a grain or starchy vegetable in the final quarter of the plate. To this you can add a small piece of fruit and 1 cup of milk or yogurt, depending on how many carbs you are supposed to eat at a meal.  ?? Try to eat about the same amount of carbs at each meal. Do not "save up" your daily allowance of carbs to eat at one meal.  ?? Proteins have very little or no carbs per serving. Examples of proteins are beef, chicken, [place], fish, eggs, tofu, cheese, cottage cheese, and peanut butter. A serving size of meat is  3 ounces, which is about the size of a deck of cards. Examples of meat substitute serving sizes (equal to 1 ounce of meat) are 1/4 cup of cottage cheese, 1 egg, 1 tablespoon of peanut butter, and ?? cup of tofu.  How can you eat out and still eat healthy?  ?? Learn to estimate the serving sizes of foods that have carbohydrate. If you measure food at home, it will be easier to estimate the amount in a serving of restaurant food.  ?? If the meal you order has too much carbohydrate (such as potatoes, corn, or baked beans), ask to have a low-carbohydrate food instead. Ask for a salad or green vegetables.  ?? If you use insulin, check your blood sugar before and after eating out to help you plan how much to eat in the future.  ?? If you eat more carbohydrate at a meal than you had planned, take a walk or do other exercise. This will help lower your blood sugar.  What else should you know?   ?? Limit saturated fat, such as the fat from meat and dairy products. This is a healthy choice because people who have diabetes are at higher risk of heart disease. So choose lean cuts of meat and nonfat or low-fat dairy products. Use olive or canola oil instead of butter or shortening when cooking.  ?? Don't skip meals. Your blood sugar may drop too low if you skip meals and take insulin or certain medicines for diabetes.  ?? Check with your doctor before you drink alcohol. Alcohol can cause your blood sugar to drop too low. Alcohol can also cause a bad reaction if you take certain diabetes medicines.  Follow-up care is a key part of your treatment and safety. Be sure to make and go to all appointments, and call your doctor if you are having problems. It's also a good idea to know your test results and keep a list of the medicines you take.  Where can you learn more?  Go to [email or web address]  Enter I147 in the search box to learn more about "Learning About Diabetes Food Guidelines."  Current as of: September 07, 2018??????????????????????????????Content Version: 12.5  ?? 2006-2020 [employer].   Care instructions adapted under license by [employer] (which disclaims liability or warranty for this information). If you have questions about a medical condition or this instruction, always ask your healthcare professional. [place] any warranty or liability for your use of this information.         Diabetes Foot Health: Care Instructions  Your Care Instructions     When you have diabetes, your feet need extra care and attention. Diabetes can damage the nerve endings and blood vessels in your feet, making you less likely to notice when your feet are injured. Diabetes also limits your body's ability to fight infection and get blood to areas that need it. If you get a minor foot injury, it could become an ulcer or a serious  infection. With good foot care, you can prevent most of these problems.  Caring for your feet can be quick and easy. Most of the care can be done when you are bathing or getting ready for bed.  Follow-up care is a key part of your treatment and safety. Be sure to make and go to all appointments, and call your doctor if you are having problems. It's also a good idea to know your test results and keep a list of the medicines  you take.  How can you care for yourself at home?  ?? Keep your blood sugar close to normal by watching what and how much you eat, monitoring blood sugar, taking medicines if prescribed, and getting regular exercise.  ?? Do not smoke. Smoking affects blood flow and can make foot problems worse. If you need help quitting, talk to your doctor about stop-smoking programs and medicines. These can increase your chances of quitting for good.  ?? Eat a diet that is low in fats. High fat intake can cause fat to build up in your blood vessels and decrease blood flow.  ?? Inspect your feet daily for blisters, cuts, cracks, or sores. If you cannot see well, use a mirror or have someone help you.  ?? Take care of your feet:  ? Wash your feet every day. Use warm (not hot) water. Check the water temperature with your wrists or other part of your body, not your feet.  ? Dry your feet well. Pat them dry. Do not rub the skin on your feet too hard. Dry well between your toes. If the skin on your feet stays moist, bacteria or a fungus can grow, which can lead to infection.  ? Keep your skin soft. Use moisturizing skin cream to keep the skin on your feet soft and prevent calluses and cracks. But do not put the cream between your toes, and stop using any cream that causes a rash.  ? Clean underneath your toenails carefully. Do not use a sharp object to clean underneath your toenails. Use the blunt end of a nail file or other rounded tool.  ? Trim and file your toenails straight across to prevent ingrown toenails.  Use a nail clipper, not scissors. Use an emery board to smooth the edges.  ?? Change socks daily. Socks without seams are best, because seams often rub the feet. You can find socks for people with diabetes from specialty catalogs.  ?? Look inside your shoes every day for things like gravel or torn linings, which could cause blisters or sores.  ?? Buy shoes that fit well:  ? Look for shoes that have plenty of space around the toes. This helps prevent bunions and blisters.  ? Try on shoes while wearing the kind of socks you will usually wear with the shoes.  ? Avoid plastic shoes. They may rub your feet and cause blisters. Good shoes should be made of materials that are flexible and breathable, such as leather or cloth.  ? Break in new shoes slowly by wearing them for no more than an hour a day for several days. Take extra time to check your feet for red areas, blisters, or other problems after you wear new shoes.  ?? Do not go barefoot. Do not wear sandals, and do not wear shoes with very thin soles. Thin soles are easy to puncture. They also do not protect your feet from hot pavement or cold weather.  ?? Have your doctor check your feet during each visit. If you have a foot problem, see your doctor. Do not try to treat an early foot problem at home. Home remedies or treatments that you can buy without a prescription (such as corn removers) can be harmful.  ?? Always get early treatment for foot problems. A minor irritation can lead to a major problem if not properly cared for early.  When should you call for help?   Call your doctor now or seek immediate medical care if:  ??  You have a foot sore, an ulcer or break in the skin that is not healing after 4 days, bleeding corns or calluses, or an ingrown toenail.  ?? You have blue or black areas, which can mean bruising or blood flow problems.  ?? You have peeling skin or tiny blisters between your toes or cracking or oozing of the skin.   ?? You have a fever for more than 24 hours and a foot sore.  ?? You have new numbness or tingling in your feet that does not go away after you move your feet or change positions.  ?? You have unexplained or unusual swelling of the foot or ankle.  Watch closely for changes in your health, and be sure to contact your doctor if:  ?? You cannot do proper foot care.  Where can you learn more?  Go to [email or web address]  Enter A739 in the search box to learn more about "Diabetes Foot Health: Care Instructions."  Current as of: September 07, 2018??????????????????????????????Content Version: 12.5  ?? 2006-2020 [employer], [employer].   Care instructions adapted under license by [employer] (which disclaims liability or warranty for this information). If you have questions about a medical condition or this instruction, always ask your healthcare professional. [place] any warranty or liability for your use of this information.         Home Blood Pressure Test: About This Test  What is it?    A home blood pressure test allows you to keep track of your blood pressure at home. Blood pressure is a measure of the force of blood against the walls of your arteries. Blood pressure readings include two numbers, such as 130/80 (say "130 over 80"). The first number is the systolic pressure. The second number is the diastolic pressure.  Why is this test done?  You may do this test at home to:  ?? Find out if you have high blood pressure.  ?? Track your blood pressure if you have high blood pressure.  ?? Track how well medicine is working to reduce high blood pressure.  ?? Check how lifestyle changes, such as weight loss and exercise, are affecting blood pressure.  How do you prepare for the test?  For at least 30 minutes before you take your blood pressure, don't exercise or use caffeine, tobacco, or medicines that raise blood pressure.  Take your blood pressure while you feel comfortable and relaxed. Sit quietly with both feet on the floor for at least 5 minutes before the test.  How is the test done?  ?? Sit with your arm slightly bent and resting on a table so that your upper arm is at the same level as your heart.  ?? Roll up your sleeve or take off your shirt to expose your upper arm.  ?? Wrap the blood pressure cuff around your upper arm so that the lower edge of the cuff is about 1 inch above the bend of your elbow.  Proceed with the following steps depending on if you are using an automatic or manual pressure monitor.  Automatic blood pressure monitors  ?? Press the on/off button on the automatic monitor and wait until the ready-to-measure "heart" symbol appears next to zero in the display window.  ?? Press the start button. The cuff will inflate and deflate by itself.  ?? Your blood pressure numbers will appear on the screen.  ?? Write your numbers in your log book, along with the date and time.  Manual  blood pressure monitors  ?? Place the earpieces of a stethoscope in your ears, and place the bell of the stethoscope over the artery, just below the cuff.  ?? Close the valve on the rubber inflating bulb.  ?? Squeeze the bulb rapidly with your opposite hand to inflate the cuff until the dial or column of mercury reads about 30 mm Hg higher than your usual systolic pressure. If you do not know your usual pressure, inflate the cuff to 210 mm Hg or until the pulse at your wrist disappears.  ?? Open the pressure valve just slightly by twisting or pressing the valve on the bulb.  ?? As you watch the pressure slowly fall, note the level on the dial at which you first start to hear a pulsing or tapping sound through the stethoscope. This is your systolic blood pressure.  ?? Continue letting the air out slowly. The sounds will become muffled and will finally disappear. Note the pressure when the sounds completely  disappear. This is your diastolic blood pressure. Let out all the remaining air.  ?? Write your numbers in your log book, along with the date and time.  Follow-up care is a key part of your treatment and safety. Be sure to make and go to all appointments, and call your doctor if you are having problems. It's also a good idea to keep a list of the medicines you take.  Where can you learn more?  Go to [email or web address]  Enter C427 in the search box to learn more about "Home Blood Pressure Test: About This Test."  Current as of: September 03, 2018??????????????????????????????Content Version: 12.5  ?? 2006-2020 [employer].   Care instructions adapted under license by [employer] (which disclaims liability or warranty for this information). If you have questions about a medical condition or this instruction, always ask your healthcare professional. [place] any warranty or liability for your use of this information.         Learning About Meal Planning for Diabetes  Why plan your meals?     Meal planning can be a key part of managing diabetes. Planning meals and snacks with the right balance of carbohydrate, protein, and fat can help you keep your blood sugar at the target level you set with your doctor.  You don't have to eat special foods. You can eat what your family eats, including sweets once in a while. But you do have to pay attention to how often you eat and how much you eat of certain foods.  You may want to work with a dietitian or a certified diabetes educator. He or she can give you tips and meal ideas and can answer your questions about meal planning. This health professional can also help you reach a healthy weight if that is one of your goals.  What plan is right for you?  Your dietitian or diabetes educator may suggest that you start with the plate format or carbohydrate counting.  The plate format   The plate format is a simple way to help you manage how you eat. You plan meals by learning how much space each food should take on a plate. Using the plate format helps you spread carbohydrate throughout the day. It can make it easier to keep your blood sugar level within your target range. It also helps you see if you're eating healthy portion sizes.  To use the plate format, you put non-starchy vegetables on half your plate. Add meat or  meat substitutes on one-quarter of the plate. Put a grain or starchy vegetable (such as brown rice or a potato) on the final quarter of the plate. You can add a small piece of fruit and some low-fat or fat-free milk or yogurt, depending on your carbohydrate goal for each meal.  Here are some tips for using the plate format:  ?? Make sure that you are not using an oversized plate. A 9-inch plate is best. Many restaurants use larger plates.  ?? Get used to using the plate format at home. Then you can use it when you eat out.  ?? Write down your questions about using the plate format. Talk to your doctor, a dietitian, or a diabetes educator about your concerns.  Carbohydrate counting  With carbohydrate counting, you plan meals based on the amount of carbohydrate in each food. Carbohydrate raises blood sugar higher and more quickly than any other nutrient. It is found in desserts, breads and cereals, and fruit. It's also found in starchy vegetables such as potatoes and corn, grains such as rice and pasta, and milk and yogurt. Spreading carbohydrate throughout the day helps keep your blood sugar levels within your target range.  Your daily amount depends on several things, including your weight, how active you are, which diabetes medicines you take, and what your goals are for your blood sugar levels. A registered dietitian or diabetes educator can help you plan how much carbohydrate to include in each meal and snack.  A guideline for your daily amount of carbohydrate is:   ?? 45 to 60 grams at each meal. That's about the same as 3 to 4 carbohydrate servings.  ?? 15 to 20 grams at each snack. That's about the same as 1 carbohydrate serving.  The Nutrition Facts label on packaged foods tells you how much carbohydrate is in a serving of the food. First, look at the serving size on the food label. Is that the amount you eat in a serving? All of the nutrition information on a food label is based on that serving size. So if you eat more or less than that, you'll need to adjust the other numbers. Total carbohydrate is the next thing you need to look for on the label. If you count carbohydrate servings, one serving of carbohydrate is 15 grams.  For foods that don't come with labels, such as fresh fruits and vegetables, you'll need a guide that lists carbohydrate in these foods. Ask your doctor, dietitian, or diabetes educator about books or other nutrition guides you can use.  If you take insulin, you need to know how many grams of carbohydrate are in a meal. This lets you know how much rapid-acting insulin to take before you eat. If you use an insulin pump, you get a constant rate of insulin during the day. So the pump must be programmed at meals to give you extra insulin to cover the rise in blood sugar after meals.  When you know how much carbohydrate you will eat, you can take the right amount of insulin. Or, if you always use the same amount of insulin, you need to make sure that you eat the same amount of carbohydrate at meals.  If you need more help to understand carbohydrate counting and food labels, ask your doctor, dietitian, or diabetes educator.  How do you get started with meal planning?  Here are some tips to get started:  ?? Plan your meals a week at a time. Don't forget to include  snacks too.  ?? Use cookbooks or online recipes to plan several main meals. Plan some quick meals for busy nights. You also can double some recipes that freeze  well. Then you can save half for other busy nights when you don't have time to cook.  ?? Make sure you have the ingredients you need for your recipes. If you're running low on basic items, put these items on your shopping list too.  ?? List foods that you use to make breakfasts, lunches, and snacks. List plenty of fruits and vegetables.  ?? Post this list on the refrigerator. Add to it as you think of more things you need.  ?? Take the list to the store to do your weekly shopping.  Follow-up care is a key part of your treatment and safety. Be sure to make and go to all appointments, and call your doctor if you are having problems. It's also a good idea to know your test results and keep a list of the medicines you take.  Where can you learn more?  Go to [email or web address]  Enter [id number] in the search box to learn more about "Learning About Meal Planning for Diabetes."  Current as of: September 07, 2018??????????????????????????????Content Version: 12.5  ?? 2006-2020 [employer], [employer].   Care instructions adapted under license by [employer] (which disclaims liability or warranty for this information). If you have questions about a medical condition or this instruction, always ask your healthcare professional. [place] any warranty or liability for your use of this information.         Paronychia: Care Instructions  Your Care Instructions  Paronychia (say "pair-oh-NY-kee-uh") is an infection of the skin around a fingernail or toenail. It happens when germs enter through a break in the skin. The doctor may have made a small cut in the infected area to drain the pus.  Most cases of paronychia improve in a few days. But watch your symptoms and follow your doctor's advice. Though rare, a mild case can turn into something more serious and infect your entire finger or toe. Also, it is possible for an infection to return.   Follow-up care is a key part of your treatment and safety. Be sure to make and go to all appointments, and call your doctor if you are having problems. It's also a good idea to know your test results and keep a list of the medicines you take.  How can you care for yourself at home?  ?? If your doctor told you how to care for your infected nail, follow the doctor's instructions. If you did not get instructions, follow this general advice:  ? Wash the area with clean water 2 times a day. Don't use hydrogen peroxide or alcohol, which can slow healing.  ? You may cover the area with a thin layer of petroleum jelly, such as Vaseline, and a nonstick bandage.  ? Apply more petroleum jelly and replace the bandage as needed.  ?? If your doctor prescribed antibiotics, take them as directed. Do not stop taking them just because you feel better. You need to take the full course of antibiotics.  ?? Take an over-the-counter pain medicine, such as acetaminophen (Tylenol), ibuprofen (Advil, Motrin), or naproxen (Aleve). Read and follow all instructions on the label.  ?? Do not take two or more pain medicines at the same time unless the doctor told you to. Many pain medicines have acetaminophen, which is Tylenol. Too much acetaminophen (Tylenol) can be harmful.  ??  Prop up the toe or finger so that it is higher than the level of your heart. This will help with pain and swelling.  ?? Apply heat. Put a warm water bottle, heating pad set on low, or warm cloth on your finger or toe. Do not go to sleep with a heating pad on your skin.  ?? Soak the area in warm water twice a day for 15 minutes each time. After soaking, dry the area well and apply a thin layer of petroleum jelly, such as Vaseline. Put on a new bandage.  When should you call for help?   Call your doctor now or seek immediate medical care if:  ?? You have signs of new or worsening infection, such as:  ? Increased pain, swelling, warmth, or redness.   ? Red streaks leading from the infected skin.  ? Pus draining from the area.  ? A fever.  Watch closely for changes in your health, and be sure to contact your doctor if:  ?? You do not get better as expected.  Where can you learn more?  Go to [email or web address]  Enter C435 in the search box to learn more about "Paronychia: Care Instructions."  Current as of: July 19, 2018??????????????????????????????Content Version: 12.5  ?? 2006-2020 [employer], [employer].   Care instructions adapted under license by [employer] (which disclaims liability or warranty for this information). If you have questions about a medical condition or this instruction, always ask your healthcare professional. [place] any warranty or liability for your use of this information.

## 2019-04-12 NOTE — Progress Notes (Signed)
Subjective  Lindsey Huynh is a 68 y.o. female.  HPI   She has not been taking Metformin since Insulin started, did not know she should   Fasting blood sugar 140's  or less     Off Amlodipine for > 1 week. Did not get from pharmacy    Bilateral foot numbness tingling and pain with first steps. Normal ABI 2017, duplex doppler ordered, delayed d/t pandemic, minimal improvement with Gabapentin   Sees Dr. Verdene Lennert, chiropractor,  three days a week, receives 3 injection, 3 times weekly in each foot.   Developed right great toe pain and swelling, drainage from around medial nail  Chiropractor took xray of right foot this Monday, was advised this was normal  She has been applying  neosporin to periungual area   Denies injury or history of similar symptoms     Past Medical History:   Diagnosis Date   ??? Diabetes ([place])    ??? GERD (gastroesophageal reflux disease)    ??? Hypercholesterolemia    ??? Hypertension        Current Outpatient Medications   Medication Sig   ??? amLODIPine (NORVASC) 10 mg tablet Take 1 Tab by mouth daily.   ??? amoxicillin-clavulanate (AUGMENTIN) 875-125 mg per tablet Take 1 Tab by mouth two (2) times a day for 10 days.   ??? acetaminophen-codeine (Tylenol-Codeine #3) 300-30 mg per tablet Take 1 Tab by mouth every four (4) hours as needed for Pain for up to 3 days. Max Daily Amount: 6 Tabs.   ??? valsartan-hydroCHLOROthiazide (DIOVAN-HCT) 160-25 mg per tablet TAKE 1 TABLET BY MOUTH EVERY DAY   ??? insulin detemir U-100 (LEVEMIR FLEXTOUCH U-100 INSULN) 100 unit/mL (3 mL) inpn 20 Units by SubCUTAneous route daily (with breakfast).   ??? gabapentin (NEURONTIN) 300 mg capsule Take 1 Cap by mouth nightly. Max Daily Amount: 300 mg.   ??? cyclopentolate (CYCLOGYL) 1 % ophthalmic solution    ??? Insulin Needles, Disposable, (NANO PEN NEEDLE) 32 gauge x 5/32" ndle Use with insulin pen 3 times daily   ??? aspirin delayed-release 81 mg tablet Take 81 mg by mouth daily.    ??? omeprazole (PRILOSEC) 20 mg capsule Take 20 mg by mouth daily.   ??? metFORMIN ER (GLUCOPHAGE XR) 500 mg tablet TAKE 2 TABLETS BY MOUTH EVERY DAY WITH DINNER     No current facility-administered medications for this visit.        Allergies   Allergen Reactions   ??? Neuromuscular Blockers, Steroidal Other (comments)     GI Upset       Past Surgical History:   Procedure Laterality Date   ??? HX COLONOSCOPY     ??? HX ORTHOPAEDIC  06-30-10    back surgery (tumor removed)   ??? HX TUMOR REMOVAL  06/30/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     Review of Systems   Constitutional: Negative for chills and fever.   Eyes: Negative for blurred vision.   Respiratory: Negative.    Cardiovascular: Negative for chest pain and palpitations.   Gastrointestinal: Negative.    Genitourinary: Negative.    Musculoskeletal: Positive for myalgias.       Objective  Visit Vitals  BP 156/80 (BP 1 Location: Right arm, BP Patient Position: Sitting)   Pulse 69   Temp 98.4 ??F (36.9 ??C) (Oral)   Resp 16   Ht 5\' 5"  (1.651 m)   Wt 180 lb (81.6 kg)   SpO2 99%   BMI 29.95 kg/m??  Physical Exam  Musculoskeletal:      Right foot: Decreased range of motion.   Feet:      Comments: Toe nails painted  Scant, serous drainage on dressing. No active drainage  Right great toe and dorsum of foot edematous, tender  1st web space macerated  Exam of toe limited d/t pain           Skin:     General: Skin is warm and dry.                 Assessment & Plan    ICD-10-CM ICD-9-CM    1. Periungual pain of foot  M79.676 729.5 amoxicillin-clavulanate (AUGMENTIN) 875-125 mg per tablet      acetaminophen-codeine (Tylenol-Codeine #3) 300-30 mg per tablet   2. Essential hypertension  I10 401.9 amLODIPine (NORVASC) 10 mg tablet   3. Uncontrolled type 2 diabetes mellitus with hyperglycemia (HCC)  E11.65 250.02 AMB POC HEMOGLOBIN A1C   4. Pyonychia of finger of right hand  L03.011 681.9 amoxicillin-clavulanate (AUGMENTIN) 875-125 mg per tablet      REFERRAL TO PODIATRY    5. Paronychia of toe of right foot  L03.031 681.11 REFERRAL TO PODIATRY     Follow-up and Dispositions    ?? Return in about 1 week (around 04/19/2019) for diabetes, htn, toe infection .     discussed likely ingrown nail vs fungal infection as cause of toe infection. Strongly encouraged podiatry referral.   Elevate legs, clean toe with 1/2 soap and water or 1/2 strength H2O2. Stop neosporin.   Discouraged foot injections by chiropractor     Hemoglobin A1c 11%. Advised to restart Metformin, check 2 hour post prandial blood sugar  HTN uncontrolled. Restart Amlodipine 10 mg   Take Gabapentin 2-3 times daily    lab results and schedule of future lab studies reviewed with patient  reviewed medications and side effects in detail  specific diabetic recommendations: home glucose monitoring emphasized, glycohemoglobin and other lab monitoring discussed and long term diabetic complications discussed     Patient voices understanding and acceptance of this advice and will call back if any further questions or concerns.  Karsten Fells, NP

## 2019-04-14 ENCOUNTER — Encounter

## 2019-04-15 DIAGNOSIS — J45909 Unspecified asthma, uncomplicated: Secondary | ICD-10-CM | POA: Diagnosis not present

## 2019-04-15 DIAGNOSIS — K219 Gastro-esophageal reflux disease without esophagitis: Secondary | ICD-10-CM | POA: Diagnosis not present

## 2019-04-15 DIAGNOSIS — M25512 Pain in left shoulder: Secondary | ICD-10-CM | POA: Diagnosis not present

## 2019-04-15 MED ORDER — VALSARTAN-HYDROCHLOROTHIAZIDE 160 MG-25 MG TAB
160-25 mg | ORAL_TABLET | ORAL | 0 refills | Status: DC
Start: 2019-04-15 — End: 2019-10-14

## 2019-04-16 ENCOUNTER — Other Ambulatory Visit (HOSPITAL_COMMUNITY)
Admission: RE | Admit: 2019-04-16 | Discharge: 2019-04-16 | Disposition: A | Discharge status: Home / Self Care | Payer: Medicare Other | Source: Ambulatory Visit | Attending: Orthopaedic Surgery | Admitting: Orthopaedic Surgery

## 2019-04-16 ENCOUNTER — Telehealth: Payer: Self-pay

## 2019-04-16 DIAGNOSIS — Z20828 Contact with and (suspected) exposure to other viral communicable diseases: Secondary | ICD-10-CM | POA: Insufficient documentation

## 2019-04-16 LAB — SARS CORONAVIRUS 2 (TAT 6-24 HRS): SARS Coronavirus 2: NEGATIVE

## 2019-04-16 NOTE — Telephone Encounter (Signed)
Patient left voice mail stating has recently been Dx'd with bronchitis and is taking Prednisone.  Okay for surgery Friday, 04/19/19?

## 2019-04-17 ENCOUNTER — Other Ambulatory Visit: Payer: Self-pay | Admitting: Physician Assistant

## 2019-04-17 ENCOUNTER — Encounter (HOSPITAL_COMMUNITY): Payer: Self-pay

## 2019-04-17 NOTE — Patient Instructions (Signed)
DUE TO COVID-19 ONLY ONE VISITOR IS ALLOWED IN THE WAITING ROOM ONLY AT THIS TIME   COVID SWAB TESTING COMPLETED ON: April 16, 2019 (Must self quarantine after testing. Follow instructions on handout.)             Your procedure is scheduled on: Friday, April 19, 2019   Report to [facility] Main  Entrance    Report to admitting at 9:45 AM   Call this number if you have problems the morning of surgery [phone]   Do not eat food:After Midnight.   May have liquids until 8:45AM day of surgery   CLEAR LIQUID DIET  Foods Allowed                                                                     Foods Excluded  Water, Black Coffee and tea, regular and decaf                             liquids that you cannot  Plain Jell-O in any flavor  (No red)                                           see through such as: Fruit ices (not with fruit pulp)                                     milk, soups, orange juice  Iced Popsicles (No red)                                    All solid food Carbonated beverages, regular and diet                                    Apple juices Sports drinks like Gatorade (No red) Lightly seasoned clear broth or consume(fat free) Sugar, honey syrup  Sample Menu Breakfast                                Lunch                                     Supper Cranberry juice                    Beef broth                            Chicken broth Jell-O                                     Grape juice  Apple juice Coffee or tea                        Jell-O                                      Popsicle                                                Coffee or tea                        Coffee or tea   Complete one Ensure drink the morning of surgery at 8:45AM the day of surgery.   Brush your teeth the morning of surgery.   Do NOT smoke after Midnight   Take these medicines the morning of surgery with A SIP OF WATER: Pantoprazole,  Paroxetine              You may not have any metal on your body including hair pins, jewelry, and body piercings             Do not wear make-up, lotions, powders, perfumes/cologne, or deodorant             Do not wear nail polish.  Do not shave  48 hours prior to surgery.             Do not bring valuables to the hospital. [place].   Contacts, dentures or bridgework may not be worn into surgery.   Bring small overnight bag day of surgery.    Special Instructions: Bring a copy of your healthcare power of attorney and living will documents         the day of surgery if you haven't scanned them in before.              Please read over the following fact sheets you were given:  [facility] - Preparing for Surgery Before surgery, you can play an important role.  Because skin is not sterile, your skin needs to be as free of germs as possible.  You can reduce the number of germs on your skin by washing with CHG (chlorahexidine gluconate) soap before surgery.  CHG is an antiseptic cleaner which kills germs and bonds with the skin to continue killing germs even after washing. Please DO NOT use if you have an allergy to CHG or antibacterial soaps.  If your skin becomes reddened/irritated stop using the CHG and inform your nurse when you arrive at Short Stay. Do not shave (including legs and underarms) for at least 48 hours prior to the first CHG shower.  You may shave your face/neck.  Please follow these instructions carefully:  1.  Shower with CHG Soap the night before surgery and the  morning of surgery.  2.  If you choose to wash your hair, wash your hair first as usual with your normal  shampoo.  3.  After you shampoo, rinse your hair and body thoroughly to remove the shampoo.  4.  Use CHG as you would any other liquid soap.  You can apply chg directly to the skin and wash.  Gently with a scrungie or clean  washcloth.  5.  Apply the CHG Soap to your body ONLY FROM THE NECK DOWN.   Do   not use on face/ open                           Wound or open sores. Avoid contact with eyes, ears mouth and   genitals (private parts).                       Wash face,  Genitals (private parts) with your normal soap.             6.  Wash thoroughly, paying special attention to the area where your    surgery  will be performed.  7.  Thoroughly rinse your body with warm water from the neck down.  8.  DO NOT shower/wash with your normal soap after using and rinsing off the CHG Soap.                9.  Pat yourself dry with a clean towel.            10.  Wear clean pajamas.            11.  Place clean sheets on your bed the night of your first shower and do not  sleep with pets. Day of Surgery : Do not apply any lotions/deodorants the morning of surgery.  Please wear clean clothes to the hospital/surgery center.  FAILURE TO FOLLOW THESE INSTRUCTIONS MAY RESULT IN THE CANCELLATION OF YOUR SURGERY  PATIENT SIGNATURE_________________________________  NURSE SIGNATURE__________________________________  ________________________________________________________________________   Adam Phenix  An incentive spirometer is a tool that can help keep your lungs clear and active. This tool measures how well you are filling your lungs with each breath. Taking long deep breaths may help reverse or decrease the chance of developing breathing (pulmonary) problems (especially infection) following:  A long period of time when you are unable to move or be active. BEFORE THE PROCEDURE   If the spirometer includes an indicator to show your best effort, your nurse or respiratory therapist will set it to a desired goal.  If possible, sit up straight or lean slightly forward. Try not to slouch.  Hold the incentive spirometer in an upright position. INSTRUCTIONS FOR USE  1. Sit on the edge of your bed if possible, or sit up  as far as you can in bed or on a chair. 2. Hold the incentive spirometer in an upright position. 3. Breathe out normally. 4. Place the mouthpiece in your mouth and seal your lips tightly around it. 5. Breathe in slowly and as deeply as possible, raising the piston or the ball toward the top of the column. 6. Hold your breath for 3-5 seconds or for as long as possible. Allow the piston or ball to fall to the bottom of the column. 7. Remove the mouthpiece from your mouth and breathe out normally. 8. Rest for a few seconds and repeat Steps 1 through 7 at least 10 times every 1-2 hours when you are awake. Take your time and take a few normal breaths between deep breaths. 9. The spirometer may include an indicator to show your best effort. Use the indicator as a goal to work toward during each  repetition. 10. After each set of 10 deep breaths, practice coughing to be sure your lungs are clear. If you have an incision (the cut made at the time of surgery), support your incision when coughing by placing a pillow or rolled up towels firmly against it. Once you are able to get out of bed, walk around indoors and cough well. You may stop using the incentive spirometer when instructed by your caregiver.  RISKS AND COMPLICATIONS  Take your time so you do not get dizzy or light-headed.  If you are in pain, you may need to take or ask for pain medication before doing incentive spirometry. It is harder to take a deep breath if you are having pain. AFTER USE  Rest and breathe slowly and easily.  It can be helpful to keep track of a log of your progress. Your caregiver can provide you with a simple table to help with this. If you are using the spirometer at home, follow these instructions: [place] IF:   You are having difficultly using the spirometer.  You have trouble using the spirometer as often as instructed.  Your pain medication is not giving enough relief while using the  spirometer.  You develop fever of 100.5 F (38.1 C) or higher. SEEK IMMEDIATE MEDICAL CARE IF:   You cough up bloody sputum that had not been present before.  You develop fever of 102 F (38.9 C) or greater.  You develop worsening pain at or near the incision site. MAKE SURE YOU:   Understand these instructions.  Will watch your condition.  Will get help right away if you are not doing well or get worse. Document Released: 01/16/2007 Document Revised: 11/28/2011 Document Reviewed: 03/19/2007 [facility] Patient Information 2014 [place], [place].   ________________________________________________________________________

## 2019-04-18 ENCOUNTER — Encounter (HOSPITAL_COMMUNITY): Payer: Self-pay

## 2019-04-18 ENCOUNTER — Other Ambulatory Visit: Payer: Self-pay

## 2019-04-18 ENCOUNTER — Encounter (HOSPITAL_COMMUNITY)
Admission: RE | Admit: 2019-04-18 | Discharge: 2019-04-18 | Disposition: A | Discharge status: Home / Self Care | Payer: Medicare Other | Source: Ambulatory Visit | Attending: Orthopaedic Surgery | Admitting: Orthopaedic Surgery

## 2019-04-18 DIAGNOSIS — M24612 Ankylosis, left shoulder: Secondary | ICD-10-CM | POA: Diagnosis not present

## 2019-04-18 DIAGNOSIS — Z7951 Long term (current) use of inhaled steroids: Secondary | ICD-10-CM | POA: Diagnosis not present

## 2019-04-18 DIAGNOSIS — Z7982 Long term (current) use of aspirin: Secondary | ICD-10-CM | POA: Diagnosis not present

## 2019-04-18 DIAGNOSIS — F419 Anxiety disorder, unspecified: Secondary | ICD-10-CM | POA: Diagnosis not present

## 2019-04-18 DIAGNOSIS — K219 Gastro-esophageal reflux disease without esophagitis: Secondary | ICD-10-CM | POA: Diagnosis not present

## 2019-04-18 DIAGNOSIS — J45909 Unspecified asthma, uncomplicated: Secondary | ICD-10-CM | POA: Diagnosis not present

## 2019-04-18 DIAGNOSIS — Z79899 Other long term (current) drug therapy: Secondary | ICD-10-CM | POA: Diagnosis not present

## 2019-04-18 DIAGNOSIS — F329 Major depressive disorder, single episode, unspecified: Secondary | ICD-10-CM | POA: Diagnosis not present

## 2019-04-18 DIAGNOSIS — M7542 Impingement syndrome of left shoulder: Secondary | ICD-10-CM | POA: Diagnosis not present

## 2019-04-18 DIAGNOSIS — E785 Hyperlipidemia, unspecified: Secondary | ICD-10-CM | POA: Diagnosis not present

## 2019-04-18 DIAGNOSIS — E559 Vitamin D deficiency, unspecified: Secondary | ICD-10-CM | POA: Diagnosis not present

## 2019-04-18 HISTORY — DX: Other complications of anesthesia, initial encounter: T88.59XA

## 2019-04-18 LAB — CBC
HCT: 44.2 % (ref 36.0–46.0)
Hemoglobin: 14.3 g/dL (ref 12.0–15.0)
MCH: 31.5 pg (ref 26.0–34.0)
MCHC: 32.4 g/dL (ref 30.0–36.0)
MCV: 97.4 fL (ref 80.0–100.0)
Platelets: 214 10*3/uL (ref 150–400)
RBC: 4.54 MIL/uL (ref 3.87–5.11)
RDW: 13.4 % (ref 11.5–15.5)
WBC: 11.1 10*3/uL — ABNORMAL HIGH (ref 4.0–10.5)
nRBC: 0 % (ref 0.0–0.2)

## 2019-04-18 LAB — GLUCOSE, CAPILLARY: Glucose-Capillary: 78 mg/dL (ref 70–99)

## 2019-04-19 ENCOUNTER — Encounter: Attending: Family | Primary: Family

## 2019-04-19 ENCOUNTER — Encounter (HOSPITAL_COMMUNITY): Payer: Self-pay | Admitting: *Deleted

## 2019-04-19 ENCOUNTER — Ambulatory Visit (HOSPITAL_COMMUNITY)
Admission: RE | Admit: 2019-04-19 | Discharge: 2019-04-19 | Disposition: A | Discharge status: Home / Self Care | Payer: Medicare Other | Attending: Orthopaedic Surgery | Admitting: Orthopaedic Surgery

## 2019-04-19 ENCOUNTER — Ambulatory Visit (HOSPITAL_COMMUNITY): Payer: Medicare Other | Admitting: Physician Assistant

## 2019-04-19 ENCOUNTER — Encounter (HOSPITAL_COMMUNITY)
Admission: RE | Disposition: A | Discharge status: Home / Self Care | Payer: Self-pay | Source: Home / Self Care | Attending: Orthopaedic Surgery

## 2019-04-19 ENCOUNTER — Ambulatory Visit (HOSPITAL_COMMUNITY): Payer: Medicare Other | Admitting: Certified Registered"

## 2019-04-19 DIAGNOSIS — Z7951 Long term (current) use of inhaled steroids: Secondary | ICD-10-CM | POA: Diagnosis not present

## 2019-04-19 DIAGNOSIS — F419 Anxiety disorder, unspecified: Secondary | ICD-10-CM | POA: Insufficient documentation

## 2019-04-19 DIAGNOSIS — K219 Gastro-esophageal reflux disease without esophagitis: Secondary | ICD-10-CM | POA: Insufficient documentation

## 2019-04-19 DIAGNOSIS — Z79899 Other long term (current) drug therapy: Secondary | ICD-10-CM | POA: Insufficient documentation

## 2019-04-19 DIAGNOSIS — J45909 Unspecified asthma, uncomplicated: Secondary | ICD-10-CM | POA: Diagnosis not present

## 2019-04-19 DIAGNOSIS — M7542 Impingement syndrome of left shoulder: Secondary | ICD-10-CM | POA: Insufficient documentation

## 2019-04-19 DIAGNOSIS — F329 Major depressive disorder, single episode, unspecified: Secondary | ICD-10-CM | POA: Insufficient documentation

## 2019-04-19 DIAGNOSIS — Z7982 Long term (current) use of aspirin: Secondary | ICD-10-CM | POA: Insufficient documentation

## 2019-04-19 DIAGNOSIS — M7502 Adhesive capsulitis of left shoulder: Secondary | ICD-10-CM

## 2019-04-19 DIAGNOSIS — M24612 Ankylosis, left shoulder: Secondary | ICD-10-CM | POA: Insufficient documentation

## 2019-04-19 DIAGNOSIS — G8918 Other acute postprocedural pain: Secondary | ICD-10-CM | POA: Diagnosis not present

## 2019-04-19 DIAGNOSIS — E559 Vitamin D deficiency, unspecified: Secondary | ICD-10-CM | POA: Insufficient documentation

## 2019-04-19 DIAGNOSIS — E785 Hyperlipidemia, unspecified: Secondary | ICD-10-CM | POA: Insufficient documentation

## 2019-04-19 HISTORY — PX: SHOULDER ARTHROSCOPY: SHX128

## 2019-04-19 SURGERY — ARTHROSCOPY, SHOULDER
Anesthesia: General | Site: Shoulder | Laterality: Left

## 2019-04-19 MED ORDER — FENTANYL CITRATE (PF) 100 MCG/2ML IJ SOLN
50.0000 ug | Freq: Once | INTRAMUSCULAR | Status: AC
  Administered 2019-04-19: 50 ug via INTRAVENOUS
  Filled 2019-04-19: qty 2

## 2019-04-19 MED ORDER — LACTATED RINGERS IV SOLN
INTRAVENOUS | Status: DC
  Administered 2019-04-19: 10:00:00 via INTRAVENOUS

## 2019-04-19 MED ORDER — SUCCINYLCHOLINE CHLORIDE 200 MG/10ML IV SOSY
PREFILLED_SYRINGE | INTRAVENOUS | Status: AC
  Filled 2019-04-19: qty 10

## 2019-04-19 MED ORDER — PROPOFOL 10 MG/ML IV BOLUS
INTRAVENOUS | Status: DC | PRN
  Administered 2019-04-19: 150 mg via INTRAVENOUS

## 2019-04-19 MED ORDER — FENTANYL CITRATE (PF) 100 MCG/2ML IJ SOLN
INTRAMUSCULAR | Status: DC | PRN
  Administered 2019-04-19: 50 ug via INTRAVENOUS

## 2019-04-19 MED ORDER — ALBUTEROL SULFATE (2.5 MG/3ML) 0.083% IN NEBU
INHALATION_SOLUTION | RESPIRATORY_TRACT | Status: AC
  Filled 2019-04-19: qty 3

## 2019-04-19 MED ORDER — ONDANSETRON HCL 4 MG/2ML IJ SOLN
INTRAMUSCULAR | Status: AC
  Filled 2019-04-19: qty 2

## 2019-04-19 MED ORDER — LIDOCAINE 2% (20 MG/ML) 5 ML SYRINGE
INTRAMUSCULAR | Status: DC | PRN
  Administered 2019-04-19: 60 mg via INTRAVENOUS

## 2019-04-19 MED ORDER — ALBUTEROL SULFATE (2.5 MG/3ML) 0.083% IN NEBU
2.5000 mg | INHALATION_SOLUTION | Freq: Once | RESPIRATORY_TRACT | Status: AC
  Administered 2019-04-19: 2.5 mg via RESPIRATORY_TRACT

## 2019-04-19 MED ORDER — MIDAZOLAM HCL 2 MG/2ML IJ SOLN
1.0000 mg | Freq: Once | INTRAMUSCULAR | Status: AC
  Administered 2019-04-19: 12:00:00 1 mg via INTRAVENOUS
  Filled 2019-04-19: qty 2

## 2019-04-19 MED ORDER — EPINEPHRINE PF 1 MG/ML IJ SOLN
INTRAMUSCULAR | Status: DC | PRN
  Administered 2019-04-19: 1 mg

## 2019-04-19 MED ORDER — PHENYLEPHRINE HCL (PRESSORS) 10 MG/ML IV SOLN
INTRAVENOUS | Status: AC
  Filled 2019-04-19: qty 1

## 2019-04-19 MED ORDER — SUCCINYLCHOLINE CHLORIDE 200 MG/10ML IV SOSY
PREFILLED_SYRINGE | INTRAVENOUS | Status: DC | PRN
  Administered 2019-04-19: 120 mg via INTRAVENOUS

## 2019-04-19 MED ORDER — BUPIVACAINE-EPINEPHRINE (PF) 0.25% -1:200000 IJ SOLN
INTRAMUSCULAR | Status: AC
  Filled 2019-04-19: qty 30

## 2019-04-19 MED ORDER — HYDROCODONE-ACETAMINOPHEN 5-325 MG PO TABS: 1.0000 | ORAL_TABLET | Freq: Four times a day (QID) | ORAL | 0 refills | Status: DC | PRN

## 2019-04-19 MED ORDER — DEXAMETHASONE SODIUM PHOSPHATE 10 MG/ML IJ SOLN
INTRAMUSCULAR | Status: DC | PRN
  Administered 2019-04-19: 4 mg via INTRAVENOUS

## 2019-04-19 MED ORDER — SODIUM CHLORIDE 0.9 % IV SOLN
INTRAVENOUS | Status: DC | PRN
  Administered 2019-04-19: 25 ug/min via INTRAVENOUS

## 2019-04-19 MED ORDER — FENTANYL CITRATE (PF) 100 MCG/2ML IJ SOLN
INTRAMUSCULAR | Status: AC
  Filled 2019-04-19: qty 2

## 2019-04-19 MED ORDER — DEXAMETHASONE SODIUM PHOSPHATE 10 MG/ML IJ SOLN
INTRAMUSCULAR | Status: AC
  Filled 2019-04-19: qty 1

## 2019-04-19 MED ORDER — ROCURONIUM BROMIDE 10 MG/ML (PF) SYRINGE
PREFILLED_SYRINGE | INTRAVENOUS | Status: DC | PRN
  Administered 2019-04-19: 50 mg via INTRAVENOUS

## 2019-04-19 MED ORDER — EPINEPHRINE PF 1 MG/ML IJ SOLN
INTRAMUSCULAR | Status: AC
  Filled 2019-04-19: qty 2

## 2019-04-19 MED ORDER — ONDANSETRON HCL 4 MG/2ML IJ SOLN: 4.0000 mg | Freq: Once | INTRAMUSCULAR | Status: DC | PRN

## 2019-04-19 MED ORDER — PROPOFOL 10 MG/ML IV BOLUS
INTRAVENOUS | Status: AC
  Filled 2019-04-19: qty 20

## 2019-04-19 MED ORDER — FENTANYL CITRATE (PF) 100 MCG/2ML IJ SOLN: 25.0000 ug | INTRAMUSCULAR | Status: DC | PRN

## 2019-04-19 MED ORDER — PHENYLEPHRINE 40 MCG/ML (10ML) SYRINGE FOR IV PUSH (FOR BLOOD PRESSURE SUPPORT)
PREFILLED_SYRINGE | INTRAVENOUS | Status: DC | PRN
  Administered 2019-04-19: 80 ug via INTRAVENOUS
  Administered 2019-04-19 (×2): 160 ug via INTRAVENOUS

## 2019-04-19 MED ORDER — ONDANSETRON 4 MG PO TBDP: 4.0000 mg | ORAL_TABLET | Freq: Three times a day (TID) | ORAL | 0 refills | Status: DC | PRN

## 2019-04-19 MED ORDER — CLINDAMYCIN PHOSPHATE 900 MG/50ML IV SOLN
900.0000 mg | INTRAVENOUS | Status: AC
  Administered 2019-04-19: 900 mg via INTRAVENOUS
  Filled 2019-04-19: qty 50

## 2019-04-19 MED ORDER — CHLORHEXIDINE GLUCONATE 4 % EX LIQD: 60.0000 mL | Freq: Once | CUTANEOUS | Status: DC

## 2019-04-19 MED ORDER — SUGAMMADEX SODIUM 200 MG/2ML IV SOLN
INTRAVENOUS | Status: DC | PRN
  Administered 2019-04-19: 140 mg via INTRAVENOUS

## 2019-04-19 MED ORDER — LIDOCAINE 2% (20 MG/ML) 5 ML SYRINGE
INTRAMUSCULAR | Status: AC
  Filled 2019-04-19: qty 5

## 2019-04-19 MED ORDER — ONDANSETRON HCL 4 MG/2ML IJ SOLN
INTRAMUSCULAR | Status: DC | PRN
  Administered 2019-04-19: 4 mg via INTRAVENOUS

## 2019-04-19 MED ORDER — LACTATED RINGERS IR SOLN
Status: DC | PRN
  Administered 2019-04-19 (×2): 6000 mL

## 2019-04-19 SURGICAL SUPPLY — 49 items
AID PSTN UNV HD RSTRNT DISP (MISCELLANEOUS)
BAG SPEC THK2 15X12 ZIP CLS (MISCELLANEOUS)
BAG ZIPLOCK 12X15 (MISCELLANEOUS) IMPLANT
BLADE 4.2CUDA (BLADE) ×2 IMPLANT
BLADE SURG SZ11 CARB STEEL (BLADE) ×2 IMPLANT
BOOTIES KNEE HIGH SLOAN (MISCELLANEOUS) ×4 IMPLANT
BUR VERTEX HOODED 4.5 (BURR) IMPLANT
BURR OVAL 8 FLU 5.0X13 (MISCELLANEOUS) ×1 IMPLANT
COVER SURGICAL LIGHT HANDLE (MISCELLANEOUS) ×2 IMPLANT
COVER WAND RF STERILE (DRAPES) IMPLANT
DECANTER SPIKE VIAL GLASS SM (MISCELLANEOUS) ×2 IMPLANT
DISSECTOR 4.0MM X 13CM (MISCELLANEOUS) ×1 IMPLANT
DRAPE SHOULDER BEACH CHAIR (DRAPES) ×2 IMPLANT
DRAPE U-SHAPE 47X51 STRL (DRAPES) ×2 IMPLANT
DRSG PAD ABDOMINAL 8X10 ST (GAUZE/BANDAGES/DRESSINGS) ×1 IMPLANT
ELECT REM PT RETURN 15FT ADLT (MISCELLANEOUS) ×2 IMPLANT
GAUZE SPONGE 4X4 12PLY STRL (GAUZE/BANDAGES/DRESSINGS) ×1 IMPLANT
GAUZE XEROFORM 1X8 LF (GAUZE/BANDAGES/DRESSINGS) ×1 IMPLANT
GLOVE BIO SURGEON STRL SZ7.5 (GLOVE) ×2 IMPLANT
GLOVE BIOGEL PI IND STRL 8 (GLOVE) ×2 IMPLANT
GLOVE BIOGEL PI INDICATOR 8 (GLOVE) ×2
GLOVE ECLIPSE 8.0 STRL XLNG CF (GLOVE) ×2 IMPLANT
GOWN STRL REUS W/TWL XL LVL3 (GOWN DISPOSABLE) ×4 IMPLANT
IV LACTATED RINGER IRRG 3000ML (IV SOLUTION) ×4
IV LR IRRIG 3000ML ARTHROMATIC (IV SOLUTION) ×2 IMPLANT
KIT BASIN OR (CUSTOM PROCEDURE TRAY) ×2 IMPLANT
KIT POSITION SHOULDER SCHLEI (MISCELLANEOUS) ×2 IMPLANT
KIT SHOULDER TRACTION (DRAPES) ×3 IMPLANT
KIT TURNOVER KIT A (KITS) IMPLANT
MANIFOLD NEPTUNE II (INSTRUMENTS) ×2 IMPLANT
NDL SAFETY ECLIPSE 18X1.5 (NEEDLE) IMPLANT
NDL SPNL 18GX3.5 QUINCKE PK (NEEDLE) ×1 IMPLANT
NEEDLE HYPO 18GX1.5 SHARP (NEEDLE) ×2
NEEDLE HYPO 22GX1.5 SAFETY (NEEDLE) ×2 IMPLANT
NEEDLE SPNL 18GX3.5 QUINCKE PK (NEEDLE) ×2 IMPLANT
PACK SHOULDER (CUSTOM PROCEDURE TRAY) ×2 IMPLANT
PORT APPOLLO RF 90DEGREE MULTI (SURGICAL WAND) ×1 IMPLANT
PROTECTOR NERVE ULNAR (MISCELLANEOUS) ×4 IMPLANT
RESTRAINT HEAD UNIVERSAL NS (MISCELLANEOUS) IMPLANT
SLING ARM IMMOBILIZER LRG (SOFTGOODS) IMPLANT
SLING ARM IMMOBILIZER MED (SOFTGOODS) ×1 IMPLANT
SUT ETHILON 3 0 PS 1 (SUTURE) ×2 IMPLANT
SYR 3ML LL SCALE MARK (SYRINGE) ×1 IMPLANT
TAPE CLOTH SURG 6X10 WHT LF (GAUZE/BANDAGES/DRESSINGS) ×1 IMPLANT
TOWEL OR 17X26 10 PK STRL BLUE (TOWEL DISPOSABLE) ×2 IMPLANT
TUBING ARTHRO INFLOW-ONLY STRL (TUBING) ×2 IMPLANT
TUBING ARTHROSCOPY IRRIG 16FT (MISCELLANEOUS) ×1 IMPLANT
TUBING CONNECTING 10 (TUBING) ×1 IMPLANT
WAND HAND CNTRL MULTIVAC 90 (MISCELLANEOUS) ×2 IMPLANT

## 2019-04-19 NOTE — Anesthesia Procedure Notes (Signed)
Anesthesia Regional Block: Interscalene brachial plexus block   Pre-Anesthetic Checklist: ,, timeout performed, Correct Patient, Correct Site, Correct Laterality, Correct Procedure, Correct Position, site marked, Risks and benefits discussed,  Surgical consent,  Pre-op evaluation,  At surgeon's request and post-op pain management  Laterality: Left  Prep: chloraprep       Needles:  Injection technique: Single-shot  Needle Type: Stimulator Needle - 40      Needle Gauge: 22     Additional Needles:   Procedures:, nerve stimulator,,,,,,,  Narrative:  Start time: 04/19/2019 11:00 AM End time: 04/19/2019 11:10 AM Injection made incrementally with aspirations every 5 mL.  Performed by: Personally   Additional Notes: 20 cc 0.5% Bupivacaine injected easily

## 2019-04-19 NOTE — H&P (Signed)
Alyssa Elliott is an 68 y.o. female.   Chief Complaint:   Left shoulder pain, stiffness and decreased motion HPI:   68 yo female with chronic left shoulder pain that has lead to left shoulder stiffness and significant decreased motion now consistent with a frozen shoulder.  A MRI was obtained showing no rotator cuff tear, but tendenosis.  Has failed all forms of conservative treatment including rest, ice, heat, NSAIDs, steroid injections and guided physical therapy.  Past Medical History:  Diagnosis Date  . Allergy   . Anxiety   . Asthma   . Cataract 2018   removed  . Complication of anesthesia    drop in BP  . DDD (degenerative disc disease)   . Depression   . GERD (gastroesophageal reflux disease)   . Hemorrhoids   . Hyperlipidemia   . Vitamin D deficiency     Past Surgical History:  Procedure Laterality Date  . 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 cataract surgery    . COLONOSCOPY    . FOOT NEUROMA SURGERY     multiple times bilaterally  . PLANTAR FASCIA RELEASE    . UPPER GI ENDOSCOPY      Family History  Problem Relation Age of Onset  . Heart disease Mother   . Diabetes Mother   . Tuberculosis Paternal Grandfather   . Bone cancer Paternal Grandfather   . Breast cancer Sister 71  . Melanoma Sister   . Cirrhosis Maternal Grandfather   . Heart failure Paternal Grandmother   . Colon cancer Neg Hx    Social History:  reports that she has never smoked. She has never used smokeless tobacco. She reports that she does not drink alcohol or use drugs.  Allergies:  Allergies  Allergen Reactions  . Codeine Nausea And Vomiting  . Penicillins Swelling and Other (See Comments)    Swelling  Of joints Did it involve swelling of the face/tongue/throat, SOB, or low BP? No Did it involve sudden or severe rash/hives, skin peeling, or any reaction on the inside of your mouth or nose? No Did you need to seek medical attention at a hospital or doctor's office? No When did  it last happen? Childhood allergy If all above answers are "NO", may proceed with cephalosporin use.   . Latex Rash and Other (See Comments)    redness  . Sulfa Antibiotics Rash    No medications prior to admission.    Results for orders placed or performed during the hospital encounter of 04/18/19 (from the past 48 hour(s))  Glucose, capillary     Status: None   Collection Time: 04/18/19  2:54 PM  Result Value Ref Range   Glucose-Capillary 78 70 - 99 mg/dL  CBC     Status: Abnormal   Collection Time: 04/18/19  3:00 PM  Result Value Ref Range   WBC 11.1 (H) 4.0 - 10.5 K/uL   RBC 4.54 3.87 - 5.11 MIL/uL   Hemoglobin 14.3 12.0 - 15.0 g/dL   HCT 44.2 36.0 - 46.0 %   MCV 97.4 80.0 - 100.0 fL   MCH 31.5 26.0 - 34.0 pg   MCHC 32.4 30.0 - 36.0 g/dL   RDW 13.4 11.5 - 15.5 %   Platelets 214 150 - 400 K/uL   nRBC 0.0 0.0 - 0.2 %    Comment: Performed at [facility], [place] [address], [place], [place] [address]   No results found.  Review of Systems  All  other systems reviewed and are negative.   Last menstrual period 09/04/2002. Physical Exam  Constitutional: She is oriented to person, place, and time. She appears well-developed and well-nourished.  HENT:  Head: Normocephalic and atraumatic.  Eyes: Pupils are equal, round, and reactive to light. EOM are normal.  Neck: Normal range of motion. Neck supple.  Cardiovascular: Normal rate and regular rhythm.  Respiratory: Effort normal and breath sounds normal.  GI: Soft. Bowel sounds are normal.  Musculoskeletal:     Left shoulder: She exhibits decreased range of motion and decreased strength.  Neurological: She is alert and oriented to person, place, and time.  Skin: Skin is warm and dry.  Psychiatric: She has a normal mood and affect.     Assessment/Plan Left shoulder pain with arthrofibrosis  To the OR today for a left shoulder manipulation under anesthesia and a shoulder arthroscopy with debridement and  capsular release.  Risks and benefits have been discussed in detail and informed consent is obtained.  Mcarthur Rossetti, MD 04/19/2019, 7:19 AM

## 2019-04-19 NOTE — Anesthesia Postprocedure Evaluation (Signed)
Anesthesia Post Note  Patient: EMIKA TIANO  Procedure(s) Performed: LEFT SHOULDER MANIPULATION UNDER ANESTHESIA AND ARTHROSCOPY WITH EXTENSIVE DEBRIDEMENT (Left Shoulder)     Patient location during evaluation: PACU Anesthesia Type: General and Regional Level of consciousness: awake and alert Pain management: pain level controlled Vital Signs Assessment: post-procedure vital signs reviewed and stable Respiratory status: spontaneous breathing, nonlabored ventilation, respiratory function stable and patient connected to nasal cannula oxygen Cardiovascular status: blood pressure returned to baseline and stable Postop Assessment: no apparent nausea or vomiting Anesthetic complications: no    Last Vitals:  Vitals:   04/19/19 1300 04/19/19 1517  BP: 124/63   Pulse:    Resp: 14 (P) 15  Temp:  (P) 36.6 C  SpO2:      Last Pain:  Vitals:   04/19/19 1010  TempSrc: Oral                 Klaudia Beirne COKER

## 2019-04-19 NOTE — Brief Op Note (Signed)
04/19/2019  3:02 PM  PATIENT:  Alyssa Elliott  68 y.o. female  PRE-OPERATIVE DIAGNOSIS:  left frozen shoulder  POST-OPERATIVE DIAGNOSIS:  left frozen shoulder  PROCEDURE:  Procedure(s): LEFT SHOULDER MANIPULATION UNDER ANESTHESIA AND ARTHROSCOPY WITH EXTENSIVE DEBRIDEMENT (Left)  SURGEON:  Surgeon(s) and Role:    Mcarthur Rossetti, MD - Primary  PHYSICIAN ASSISTANT: Benita Stabile, PA-C  ANESTHESIA:   regional and general  COUNTS:  YES  DICTATION: .Other Dictation: Dictation Number [id number]  PLAN OF CARE: Discharge to home after PACU  PATIENT DISPOSITION:  PACU - hemodynamically stable.   Delay start of Pharmacological VTE agent (>24hrs) due to surgical blood loss or risk of bleeding: no

## 2019-04-19 NOTE — Anesthesia Preprocedure Evaluation (Signed)
Anesthesia Evaluation  Patient identified by MRN, date of birth, ID band Patient awake    Reviewed: Allergy & Precautions, NPO status , Patient's Chart, lab work & pertinent test results  Airway Mallampati: II  TM Distance: >3 FB Neck ROM: Full    Dental  (+) Teeth Intact, Dental Advisory Given   Pulmonary    breath sounds clear to auscultation       Cardiovascular  Rhythm:Regular Rate:Normal     Neuro/Psych    GI/Hepatic   Endo/Other    Renal/GU      Musculoskeletal   Abdominal   Peds  Hematology   Anesthesia Other Findings   Reproductive/Obstetrics                             Anesthesia Physical Anesthesia Plan  ASA: II  Anesthesia Plan: General   Post-op Pain Management:  Regional for Post-op pain   Induction:   PONV Risk Score and Plan: Ondansetron and Dexamethasone  Airway Management Planned: Oral ETT  Additional Equipment:   Intra-op Plan:   Post-operative Plan: Extubation in OR  Informed Consent: I have reviewed the patients History and Physical, chart, labs and discussed the procedure including the risks, benefits and alternatives for the proposed anesthesia with the patient or authorized representative who has indicated his/her understanding and acceptance.     Dental advisory given  Plan Discussed with: CRNA and Anesthesiologist  Anesthesia Plan Comments:         Anesthesia Quick Evaluation

## 2019-04-19 NOTE — Progress Notes (Signed)
Assisted Dr. Joslin with left, ultrasound guided, interscalene  block. Side rails up, monitors on throughout procedure. See vital signs in flow sheet. Tolerated Procedure well. 

## 2019-04-19 NOTE — Op Note (Signed)
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  PROCEDURE: 1.  Left shoulder manipulation under anesthesia. 2.  Left shoulder extensive debridement with capsular release and subacromial decompression.  SURGEON:  Lind Guest. Ninfa Linden, MD  ASSISTANT:  Erskine Emery, PA-C  ANESTHESIA: 1.  Left shoulder regional block. 2.  General.  BLOOD LOSS:  Minimal.  ANTIBIOTICS:  Two grams IV Ancef.  COMPLICATIONS:  None.  INDICATIONS:  The patient is a very pleasant 68 year old active female who has developed worsening left shoulder pain and stiffness over many months now.  She has tried at least 2 subacromial steroid injections and been through extensive physical therapy  of her shoulder, but her range of motion has become quite limited, and this has detrimentally affected her mobility and her activities of daily living.  With the failure of conservative treatment, an MRI was obtained and showed just mild tendinosis of  the rotator cuff.  There was no obvious gross tear.  At this point, we recommended a manipulation under anesthesia with extensive debridement and capsular release as well as subacromial decompression in the hopes of decreasing her pain in the shoulder  and improving her mobility.  She does understand we will need to get her back into physical therapy starting next week.  DESCRIPTION OF PROCEDURE:  After informed consent was obtained and appropriate left shoulder was marked, anesthesia obtained a regional left shoulder block in the holding room.  She was then brought to the operating room and  placed supine on the  operating table.  General anesthesia was then obtained.  She was then fashioned into a beach-chair position with appropriate position of the head and neck and padding of the down nonoperative right arm.  There was bending at the waist and knees and  palpable pulses in her feet.  Her left shoulder was then prepped and draped with DuraPrep and sterile drapes.  It was then placed in a skeletal traction device using 10 pounds of traction and 45 degrees of forward flexion with neutral rotation.  A  time-out was called.  She was identified as correct patient, correct left shoulder.  Of note, prior to doing this, we did manipulate her shoulder.  I was able to get full abduction and forward flexion of the shoulder.  With her internal rotation with  adduction, I was able to get up to the mid thoracic spine, and her external rotation was almost full.  We then entered the glenohumeral joint through the posterior portal, and we found extensive thickening of the capsule in the front of the shoulder.  We  made an anterior portal in the rotator interval and carried out extensive debridement of glenohumeral joint with releasing as much capsule tissue as we can, and removing an abundant amount of inflamed synovium.  We then entered the subacromial space  through the posterior portal.  Of note, we did find some degenerative tearing of the anterior labrum as well, which we debrided back.  The cartilage on the face of the glenoid and the humeral head was intact and the biceps tendon was intact.  The  subscapularis was also intact.  There was no gross-looking deformity of the rotator cuff itself.  When we entered the subacromial space,  we found significant tendinosis of the rotator cuff and significant bursitis in the shoulder.  We had used a  high-speed bur to perform a partial acromioplasty and arthroscopic shaver and soft tissue ablation wand to perform a significant bursectomy and remove thickened  tendinosis tissue off the rotator cuff.  Once we had done so, we felt like we had an adequate  decompression of the shoulder.  We then irrigated fluid through the shoulder itself and removed all instrumentation.  We tried to drain as much fluid from the shoulder as we could.  We closed these portal sites with interrupted nylon suture.  Xeroform  and a well-padded sterile dressing were applied, and she was placed in a sling, awakened, extubated, taken to recovery room in stable condition.  All final counts were correct.  There were no complications noted.  Of note, Benita Stabile, PA-C's, assistance  was crucial during all portions of this case.  Postoperatively, we will need to get the patient's shoulder moving as much as possible.  Followup in the office in a week.  LN/NUANCE  D:04/19/2019 T:04/19/2019 JOB:[id number]

## 2019-04-19 NOTE — Discharge Instructions (Signed)
Expect left shoulder swelling and some drainage thru the small incisions. Ice as needed periodically for swelling. You should wear your sling today. Starting tomorrow 8/1 try to get your left shoulder moving as much as comfort allows. Try to get back into physical therapy starting next week. You can remove your dressings in 1-2 days and start getting your incisions wet in the shower daily. Place band-aids over your incisions daily.   General Anesthesia, Adult, Care After This sheet gives you information about how to care for yourself after your procedure. Your health care provider may also give you more specific instructions. If you have problems or questions, contact your health care provider. What can I expect after the procedure? After the procedure, the following side effects are common:  Pain or discomfort at the IV site.  Nausea.  Vomiting.  Sore throat.  Trouble concentrating.  Feeling cold or chills.  Weak or tired.  Sleepiness and fatigue.  Soreness and body aches. These side effects can affect parts of the body that were not involved in surgery. Follow these instructions at home:  For at least 24 hours after the procedure:  Have a responsible adult stay with you. It is important to have someone help care for you until you are awake and alert.  Rest as needed.  Do not: ? Participate in activities in which you could fall or become injured. ? Drive. ? Use heavy machinery. ? Drink alcohol. ? Take sleeping pills or medicines that cause drowsiness. ? Make important decisions or sign legal documents. ? Take care of children on your own. Eating and drinking  Follow any instructions from your health care provider about eating or drinking restrictions.  When you feel hungry, start by eating small amounts of foods that are soft and easy to digest (bland), such as toast. Gradually return to your regular diet.  Drink enough fluid to keep your urine pale yellow.  If  you vomit, rehydrate by drinking water, juice, or clear broth. General instructions  If you have sleep apnea, surgery and certain medicines can increase your risk for breathing problems. Follow instructions from your health care provider about wearing your sleep device: ? Anytime you are sleeping, including during daytime naps. ? While taking prescription pain medicines, sleeping medicines, or medicines that make you drowsy.  Return to your normal activities as told by your health care provider. Ask your health care provider what activities are safe for you.  Take over-the-counter and prescription medicines only as told by your health care provider.  If you smoke, do not smoke without supervision.  Keep all follow-up visits as told by your health care provider. This is important. Contact a health care provider if:  You have nausea or vomiting that does not get better with medicine.  You cannot eat or drink without vomiting.  You have pain that does not get better with medicine.  You are unable to pass urine.  You develop a skin rash.  You have a fever.  You have redness around your IV site that gets worse. Get help right away if:  You have difficulty breathing.  You have chest pain.  You have blood in your urine or stool, or you vomit blood. Summary  After the procedure, it is common to have a sore throat or nausea. It is also common to feel tired.  Have a responsible adult stay with you for the first 24 hours after general anesthesia. It is important to have someone help care for  you until you are awake and alert.  When you feel hungry, start by eating small amounts of foods that are soft and easy to digest (bland), such as toast. Gradually return to your regular diet.  Drink enough fluid to keep your urine pale yellow.  Return to your normal activities as told by your health care provider. Ask your health care provider what activities are safe for you. This  information is not intended to replace advice given to you by your health care provider. Make sure you discuss any questions you have with your health care provider. Document Released: 12/12/2000 Document Revised: 09/08/2017 Document Reviewed: 04/21/2017 Elsevier Patient Education  2020 [employer].

## 2019-04-19 NOTE — Transfer of Care (Signed)
Immediate Anesthesia Transfer of Care Note  Patient: Alyssa Elliott  Procedure(s) Performed: LEFT SHOULDER MANIPULATION UNDER ANESTHESIA AND ARTHROSCOPY WITH EXTENSIVE DEBRIDEMENT (Left Shoulder)  Patient Location: PACU  Anesthesia Type:General  Level of Consciousness: awake and alert   Airway & Oxygen Therapy: Patient Spontanous Breathing and Patient connected to face mask oxygen  Post-op Assessment: Report given to RN and Post -op Vital signs reviewed and stable  Post vital signs: Reviewed and stable  Last Vitals:  Vitals Value Taken Time  BP 143/78 04/19/19 1518  Temp    Pulse 90 04/19/19 1518  Resp    SpO2 89 % 04/19/19 1518  Vitals shown include unvalidated device data.  Last Pain:  Vitals:   04/19/19 1010  TempSrc: Oral         Complications: No apparent anesthesia complications

## 2019-04-19 NOTE — Anesthesia Procedure Notes (Signed)
Procedure Name: Intubation Date/Time: 04/19/2019 1:40 PM Performed by: Niel Hummer, CRNA Pre-anesthesia Checklist: Patient being monitored, Suction available, Patient identified and Emergency Drugs available Patient Re-evaluated:Patient Re-evaluated prior to induction Oxygen Delivery Method: Circle system utilized Preoxygenation: Pre-oxygenation with 100% oxygen Induction Type: IV induction and Rapid sequence Laryngoscope Size: Mac and 4 Grade View: Grade I Tube type: Oral Tube size: 7.0 mm Number of attempts: 1 Airway Equipment and Method: Stylet Placement Confirmation: ETT inserted through vocal cords under direct vision,  positive ETCO2 and breath sounds checked- equal and bilateral Secured at: 21 cm Tube secured with: Tape Dental Injury: Teeth and Oropharynx as per pre-operative assessment

## 2019-04-20 ENCOUNTER — Encounter (HOSPITAL_COMMUNITY): Payer: Self-pay | Admitting: Orthopaedic Surgery

## 2019-04-24 DIAGNOSIS — M25512 Pain in left shoulder: Secondary | ICD-10-CM | POA: Diagnosis not present

## 2019-04-26 DIAGNOSIS — J301 Allergic rhinitis due to pollen: Secondary | ICD-10-CM | POA: Diagnosis not present

## 2019-04-26 DIAGNOSIS — J3089 Other allergic rhinitis: Secondary | ICD-10-CM | POA: Diagnosis not present

## 2019-04-29 ENCOUNTER — Ambulatory Visit (INDEPENDENT_AMBULATORY_CARE_PROVIDER_SITE_OTHER): Payer: Medicare Other | Admitting: Orthopaedic Surgery

## 2019-04-29 ENCOUNTER — Other Ambulatory Visit: Payer: Self-pay

## 2019-04-29 ENCOUNTER — Encounter: Payer: Self-pay | Admitting: Orthopaedic Surgery

## 2019-04-29 DIAGNOSIS — M25512 Pain in left shoulder: Secondary | ICD-10-CM | POA: Diagnosis not present

## 2019-04-29 DIAGNOSIS — M7502 Adhesive capsulitis of left shoulder: Secondary | ICD-10-CM

## 2019-04-29 NOTE — Progress Notes (Signed)
The patient comes in today for routine follow-up 11 days after having a left shoulder manipulation under anesthesia and an extensive debridement with capsular release.  We did find significant inflamed tissue the anterior capsule and signs of impingement.  She is still struggling with her shoulder and says she has been trying physical therapy and it still hurts quite a bit but is a different pain than before.  On examination her suture lines look good to remove the sutures.  There is no evidence of infection.  Her range of motion is still significant limited.  We did go over her arthroscopy pictures to show her the extent of her surgery and what we found within her shoulder.  It is essential that she put herself through physical therapy.  She will also try Voltaren gel on her shoulder and she is taking Tylenol as well.  We will see her back in 4 weeks to see how she is doing overall.  All question concerns were answered and addressed.

## 2019-04-30 ENCOUNTER — Inpatient Hospital Stay: Payer: Medicare (Managed Care) | Attending: Family | Primary: Family

## 2019-04-30 ENCOUNTER — Ambulatory Visit: Payer: Medicare (Managed Care) | Primary: Family

## 2019-05-01 DIAGNOSIS — M25512 Pain in left shoulder: Secondary | ICD-10-CM | POA: Diagnosis not present

## 2019-05-03 DIAGNOSIS — M25512 Pain in left shoulder: Secondary | ICD-10-CM | POA: Diagnosis not present

## 2019-05-07 DIAGNOSIS — M25512 Pain in left shoulder: Secondary | ICD-10-CM | POA: Diagnosis not present

## 2019-05-10 DIAGNOSIS — M25512 Pain in left shoulder: Secondary | ICD-10-CM | POA: Diagnosis not present

## 2019-05-13 DIAGNOSIS — M25512 Pain in left shoulder: Secondary | ICD-10-CM | POA: Diagnosis not present

## 2019-05-15 DIAGNOSIS — D2330 Other benign neoplasm of skin of unspecified part of face: Secondary | ICD-10-CM | POA: Diagnosis not present

## 2019-05-15 DIAGNOSIS — D2272 Melanocytic nevi of left lower limb, including hip: Secondary | ICD-10-CM | POA: Diagnosis not present

## 2019-05-15 DIAGNOSIS — L821 Other seborrheic keratosis: Secondary | ICD-10-CM | POA: Diagnosis not present

## 2019-05-15 DIAGNOSIS — L719 Rosacea, unspecified: Secondary | ICD-10-CM | POA: Diagnosis not present

## 2019-05-16 DIAGNOSIS — M25512 Pain in left shoulder: Secondary | ICD-10-CM | POA: Diagnosis not present

## 2019-05-20 ENCOUNTER — Telehealth: Payer: Self-pay | Admitting: Pulmonary Disease

## 2019-05-20 NOTE — Telephone Encounter (Signed)
LMTCB

## 2019-05-20 NOTE — Telephone Encounter (Signed)
Returned call to patient.  Patient states she had recent diagnosis of bronchiectasis and would like to discuss the diagnosis with a provider for a better understanding.  She also requested a possible change in MD's due to being unable to get an appointment with Dr. Elsworth Soho after calling a few times to schedule the appt.  Patient feels a change in her SOB and chest congestion recently and feels she may be experiencing a 'bronchiectasis flare up.'    Advised patient that Dr. Elsworth Soho is adding some additional dates to his calendar but could not find an opening for her at this time.  Offered her an appointment with a Nurse Practitioner to discuss the bronchiectasis and her recent increasing SOB.  Explained our NP's are here to provide care when our MD's are not available and are very experienced in caring for her and discussing her condition.  She could get a quick appointment and also further discuss her concerns regarding Dr. Bari Mantis scheduling availability.    Patient was agreeable to scheduling appointment.  Requesting appt 05/21/19 in late afternoon and advised due to active congestion and SOB, we would suggest virtual or televisit. Patient states she is familiar with mychart video visits and agreed to schedule this on 05/21/19 at 4pm with Tonia Brooms, NP.  Nothing further needed.

## 2019-05-20 NOTE — Progress Notes (Signed)
Virtual Visit via Telephone Note  I connected with Alyssa Elliott on 05/21/19 at  4:00 PM EDT by telephone and verified that I am speaking with the correct person using two identifiers.  Location: Patient: Home Provider: [facility] - [address], [place], [place], [place] [address]   I discussed the limitations, risks, security and privacy concerns of performing an evaluation and management service by telephone and the availability of in person appointments. I also discussed with the patient that there may be a patient responsible charge related to this service. The patient expressed understanding and agreed to proceed.  Patient consented to consult via telephone: Yes People present and their role in pt care: Pt   History of Present Illness:  68 year old female never smoker followed in our office for asthmatic bronchitis and bronchiectasis  Past medical history: GERD Smoking history: Never smoker Maintenance: Breo Ellipta 200 Patient of Dr. Elsworth Soho  Chief complaint: Cough   68 year old female never smoker completing a tele-visit with our office today with 2 weeks of symptoms of productive cough with yellow discolored mucus.  Patient also reports that she is wheezing all the time.  No audible wheeze heard on the telephone.  Patient reports she is been having to use her rescue inhaler 1 or 2 times daily to help with her breathing.  She continues to be maintained on Breo Ellipta 200.  Patient also has concerns and questions regarding bronchiectasis.  We will discuss this in more detail today's visit over the phone.  Observations/Objective:  PFT-10/22/12- within normal limits. FVC 2.76/92%, FEV1 2.16/99%, FEV1/FVC 0.78. Slight response to bronchodilator. TLC 83%, DLCO 94%. RAST 09/2012 Total IgE 24.3, elevated for grass pollens. ESR13,EOS11.6.  2014 hypersensitivity pneumonitis panel-negative  CT chest 02/2013 no obvious bronchiectasis, mild groundglass and  scarring CT maxillary sinuses 09/2012-bilateral maxillary sinusitis and frontal sinus collection  11/17/2018-CT maxillofacial- overall stable Nasal sinus disease with stable moderate maxillary sinus mucosal thickening predominantly within the alveolar recesses, decreased mucosal thickening in the frontal sinuses and increased mucosal thickening of left sided ethmoid air cells  11/19/2018-CT chest high-res- mild cylindrical bronchiectasis throughout both lungs with mild diffuse bronchial wall thickening not subsequently changed since 2014 chest CT, minimal scattered mucoid impaction, no significant tree-in-bud opacities, prominent patchy air trapping throughout both lungs indicative of small airways disease  Assessment and Plan:  Seasonal and perennial allergic rhinitis Plan: Resume daily Xyzal  Asthma with bronchitis Plan: Continue Breo Ellipta 200 Continue Xopenex inhaler as needed for shortness of breath and wheezing Prednisone today  Bronchiectasis with acute exacerbation ([place]) Plan: Doxycycline today Prednisone today Patient will need flutter valve at next office visit Patient needs high-dose flu vaccine in fall/2020    GERD (gastroesophageal reflux disease) Plan: Continue Protonix  Healthcare maintenance Plan: Patient needs flu vaccine in fall/2020 We contacted patient's pharmacy and requested Pneumovax 23 records, no records found Patient informed, patient will follow up with primary care office regarding pneumonia vaccine status  Follow Up Instructions:  Return in about 4 weeks (around 06/18/2019), or if symptoms worsen or fail to improve, for Follow up with Dr. Elsworth Soho, Follow up with Wyn Quaker FNP-C.   I discussed the assessment and treatment plan with the patient. The patient was provided an opportunity to ask questions and all were answered. The patient agreed with the plan and demonstrated an understanding of the instructions.   The patient was advised to call back or  seek an in-person evaluation if the symptoms worsen or if  the condition fails to improve as anticipated.  I provided 24 minutes of non-face-to-face time during this encounter.   Lauraine Rinne, NP

## 2019-05-20 NOTE — Telephone Encounter (Signed)
Pt returning call.  [phone].

## 2019-05-21 ENCOUNTER — Ambulatory Visit (INDEPENDENT_AMBULATORY_CARE_PROVIDER_SITE_OTHER): Payer: Medicare Other | Admitting: Pulmonary Disease

## 2019-05-21 ENCOUNTER — Encounter: Payer: Self-pay | Admitting: Pulmonary Disease

## 2019-05-21 ENCOUNTER — Other Ambulatory Visit: Payer: Self-pay

## 2019-05-21 DIAGNOSIS — J45909 Unspecified asthma, uncomplicated: Secondary | ICD-10-CM

## 2019-05-21 DIAGNOSIS — J320 Chronic maxillary sinusitis: Secondary | ICD-10-CM

## 2019-05-21 DIAGNOSIS — J302 Other seasonal allergic rhinitis: Secondary | ICD-10-CM | POA: Diagnosis not present

## 2019-05-21 DIAGNOSIS — J3089 Other allergic rhinitis: Secondary | ICD-10-CM

## 2019-05-21 DIAGNOSIS — J471 Bronchiectasis with (acute) exacerbation: Secondary | ICD-10-CM | POA: Diagnosis not present

## 2019-05-21 DIAGNOSIS — K219 Gastro-esophageal reflux disease without esophagitis: Secondary | ICD-10-CM

## 2019-05-21 DIAGNOSIS — Z0000 Encounter for general adult medical examination without abnormal findings: Secondary | ICD-10-CM

## 2019-05-21 MED ORDER — DOXYCYCLINE HYCLATE 100 MG PO TABS: 100.0000 mg | ORAL_TABLET | Freq: Two times a day (BID) | ORAL | 0 refills | Status: DC

## 2019-05-21 MED ORDER — PREDNISONE 10 MG PO TABS: ORAL_TABLET | ORAL | 0 refills | Status: DC

## 2019-05-21 NOTE — Assessment & Plan Note (Signed)
Plan: Doxycycline today Prednisone today Patient will need flutter valve at next office visit Patient needs high-dose flu vaccine in fall/2020

## 2019-05-21 NOTE — Assessment & Plan Note (Signed)
Plan: Continue Breo Ellipta 200 Continue Xopenex inhaler as needed for shortness of breath and wheezing Prednisone today

## 2019-05-21 NOTE — Patient Instructions (Addendum)
You were seen today by Lauraine Rinne, NP  for:   1. Bronchiectasis with acute exacerbation (HCC)  Bronchiectasis: This is the medical term which indicates that you have damage, dilated airways making you more susceptible to respiratory infection. Let us know if you have cough with change in mucus color or fevers or chills.  At that point you would need an antibiotic. Maintain a healthy nutritious diet, eating whole foods Take your medications as prescribed   - doxycycline (VIBRA-TABS) 100 MG tablet; Take 1 tablet (100 mg total) by mouth 2 (two) times daily.  Dispense: 20 tablet; Refill: 0 - predniSONE (DELTASONE) 10 MG tablet; Take 2 tablets (20mg  total) daily for the next 5 days. Take in the AM with food.  Dispense: 10 tablet; Refill: 0  We will need to get you set up with using a flutter valve at your next office visit  Can consider starting to take daily Mucinex 600mg  (plain not DM) to help with sputum clearance  2. Asthma with bronchitis  Breo Ellipta 200 >>> Take 1 puff daily in the morning right when you wake up >>>Rinse your mouth out after use >>>This is a daily maintenance inhaler, NOT a rescue inhaler >>>Contact our office if you are having difficulties affording or obtaining this medication >>>It is important for you to be able to take this daily and not miss any doses   Only use your xopenex as a rescue medication to be used if you can't catch your breath by resting or doing a relaxed purse lip breathing pattern.  - The less you use it, the better it will work when you need it. - Ok to use up to 2 puffs  every 4 hours if you must but call for immediate appointment if use goes up over your usual need - Don't leave home without it !!  (think of it like the spare tire for your car)   - predniSONE (DELTASONE) 10 MG tablet; Take 2 tablets (20mg  total) daily for the next 5 days. Take in the AM with food.  Dispense: 10 tablet; Refill: 0  3. Seasonal and perennial allergic  rhinitis  Start taking Xyzal 5mg  daily   4. Healthcare maintenance  Flu vaccine in fall/2020  We will request records from your pharmacy for your pneumonia vaccine   We recommend today:  No orders of the defined types were placed in this encounter.  No orders of the defined types were placed in this encounter.  Meds ordered this encounter  Medications  . doxycycline (VIBRA-TABS) 100 MG tablet    Sig: Take 1 tablet (100 mg total) by mouth 2 (two) times daily.    Dispense:  20 tablet    Refill:  0  . predniSONE (DELTASONE) 10 MG tablet    Sig: Take 2 tablets (20mg  total) daily for the next 5 days. Take in the AM with food.    Dispense:  10 tablet    Refill:  0    Follow Up:    Return in about 4 weeks (around 06/18/2019), or if symptoms worsen or fail to improve, for Follow up with Dr. Elsworth Soho, Follow up with Wyn Quaker FNP-C.   Please do your part to reduce the spread of COVID-19:      Reduce your risk of any infection  and COVID19 by using the similar precautions used for avoiding the common cold or flu:  Marland Kitchen Wash your hands often with soap and warm water for at least 20 seconds.  If soap and water are not readily available, use an alcohol-based hand sanitizer with at least 60% alcohol.  . If coughing or sneezing, cover your mouth and nose by coughing or sneezing into the elbow areas of your shirt or coat, into a tissue or into your sleeve (not your hands). Langley Gauss A MASK when in public  . Avoid shaking hands with others and consider head nods or verbal greetings only. . Avoid touching your eyes, nose, or mouth with unwashed hands.  . Avoid close contact with people who are sick. . Avoid places or events with large numbers of people in one location, like concerts or sporting events. . If you have some symptoms but not all symptoms, continue to monitor at home and seek medical attention if your symptoms worsen. . If you are having a medical emergency, call 911.   Rachel / e-Visit: [email or web address]         [facility]: [facility]: [id number]                   [facility]: [id number]     It is flu season:   >>> Best ways to protect herself from the flu: Receive the yearly flu vaccine, practice good hand hygiene washing with soap and also using hand sanitizer when available, eat a nutritious meals, get adequate rest, hydrate appropriately   Please contact the office if your symptoms worsen or you have concerns that you are not improving.   Thank you for choosing [facility] for your healthcare, and for allowing [place] to partner with you on your healthcare journey. I am thankful to be able to provide care to you today.   Wyn Quaker FNP-C      Bronchiectasis  Bronchiectasis is a condition in which the airways in the lungs (bronchi) are damaged and widened. The condition makes it hard for the lungs to get rid of mucus, and it causes mucus to gather in the bronchi. This condition often leads to lung infections, which can make the condition worse. What are the causes? You can be born with this condition or you can develop it later in life. Common causes of this condition include:  Cystic fibrosis.  Repeated lung infections, such as pneumonia or tuberculosis.  An object or other blockage in the lungs.  Breathing in fluid, food, or other objects (aspiration).  A problem with the immune system and lung structure that is present at birth (congenital). Sometimes the cause is not known. What are the signs or symptoms? Common symptoms of this condition include:  A daily cough that brings up mucus and lasts for more than 3 weeks.  Lung infections that happen often.  Shortness of breath and wheezing.  Weakness and fatigue. How is this diagnosed? This condition is diagnosed with  tests, such as:  Chest X-rays or CT scans. These are done to check for changes in the lungs.  Breathing tests. These are done to check how well your lungs are working.  A test of a sample of your saliva (sputum culture). This test is done to check for infection.  Blood tests and other tests. These are done to check for related diseases or causes. How is this treated? Treatment for this condition depends on the severity of the illness and its cause. Treatment may include:  Medicines that loosen mucus so it can be coughed up (expectorants).  Medicines that relax the muscles of the bronchi (bronchodilators).  Antibiotic medicines to prevent or treat infection.  Physical therapy to help clear mucus from the lungs. Techniques may include: ? Postural drainage. This is when you sit or lie in certain positions so that mucus can drain by gravity. ? Chest percussion. This involves tapping the chest or back with a cupped hand. ? Chest vibration. For this therapy, a hand or special equipment vibrates your chest and back.  Surgery to remove the affected part of the lung. This may be done in severe cases. Follow these instructions at home: Medicines  Take over-the-counter and prescription medicines only as told by your health care provider.  If you were prescribed an antibiotic medicine, take it as told by your health care provider. Do not stop taking the antibiotic even if you start to feel better.  Avoid taking sedatives and antihistamines unless your health care provider tells you to take them. These medicines tend to thicken the mucus in the lungs. Managing symptoms  Perform breathing exercises or techniques to clear your lungs as told by your health care provider.  Consider using a cold steam vaporizer or humidifier in your room or home to help loosen secretions.  If you have a cough that gets worse at night, try sleeping in a semi-upright position. General instructions  Get plenty of  rest.  Drink enough fluid to keep your urine clear or pale yellow.  Stay inside when pollution and ozone levels are high.  Stay up to date with vaccinations and immunizations.  Avoid cigarette smoke and other lung irritants.  Do not use any products that contain nicotine or tobacco, such as cigarettes and e-cigarettes. If you need help quitting, ask your health care provider.  Keep all follow-up visits as told by your health care provider. This is important. Contact a health care provider if:  You cough up more sputum than before and the sputum is yellow or green in color.  You have a fever.  You cannot control your cough and are losing sleep. Get help right away if:  You cough up blood.  You have chest pain.  You have increasing shortness of breath.  You have pain that gets worse or is not controlled with medicines.  You have a fever and your symptoms suddenly get worse. Summary  Bronchiectasis is a condition in which the airways in the lungs (bronchi) are damaged and widened. The condition makes it hard for the lungs to get rid of mucus, and it causes mucus to gather in the bronchi.  Treatment usually includes therapy to help clear mucus from the lungs.  Stay up to date with vaccinations and immunizations. This information is not intended to replace advice given to you by your health care provider. Make sure you discuss any questions you have with your health care provider. Document Released: 07/03/2007 Document Revised: 08/18/2017 Document Reviewed: 10/10/2016 Elsevier Patient Education  2020 [employer].

## 2019-05-21 NOTE — Assessment & Plan Note (Signed)
Plan: Patient needs flu vaccine in fall/2020 We contacted patient's pharmacy and requested Pneumovax 23 records, no records found Patient informed, patient will follow up with primary care office regarding pneumonia vaccine status

## 2019-05-21 NOTE — Assessment & Plan Note (Signed)
Plan: Resume daily Xyzal

## 2019-05-21 NOTE — Assessment & Plan Note (Signed)
Plan: Continue Protonix 

## 2019-05-22 DIAGNOSIS — M25512 Pain in left shoulder: Secondary | ICD-10-CM | POA: Diagnosis not present

## 2019-05-24 DIAGNOSIS — M25512 Pain in left shoulder: Secondary | ICD-10-CM | POA: Diagnosis not present

## 2019-05-28 DIAGNOSIS — M25512 Pain in left shoulder: Secondary | ICD-10-CM | POA: Diagnosis not present

## 2019-05-29 ENCOUNTER — Other Ambulatory Visit: Payer: Self-pay

## 2019-05-29 ENCOUNTER — Ambulatory Visit (INDEPENDENT_AMBULATORY_CARE_PROVIDER_SITE_OTHER): Payer: Medicare Other | Admitting: Orthopaedic Surgery

## 2019-05-29 ENCOUNTER — Encounter: Payer: Self-pay | Admitting: Orthopaedic Surgery

## 2019-05-29 DIAGNOSIS — M25512 Pain in left shoulder: Secondary | ICD-10-CM

## 2019-05-29 DIAGNOSIS — M7502 Adhesive capsulitis of left shoulder: Secondary | ICD-10-CM

## 2019-05-29 DIAGNOSIS — G8929 Other chronic pain: Secondary | ICD-10-CM

## 2019-05-29 NOTE — Progress Notes (Signed)
The patient is continue to follow-up after having a left shoulder manipulation under anesthesia with subacromial decompression and debridement.  She is finally feel like she is making a turn for the best.  Therapy is going well.  She reports better shoulder function and decrease pain.  On examination of the left shoulder she still has functional limitations with abduction as well as internal rotation.  She has limitations with forward flexion.  However all these have improved since her last visit.  She feels like therapy is going well and I did review the notes from therapy.  At this point I would like her to continue therapy for at least another month.  She is going to try Voltaren gel for her shoulder.  We will see how she looks in 4 weeks from now.  All question concerns were answered addressed.

## 2019-05-30 DIAGNOSIS — M25512 Pain in left shoulder: Secondary | ICD-10-CM | POA: Diagnosis not present

## 2019-05-30 NOTE — Progress Notes (Signed)
'@Patient'  ID: Idolina Primer, female    DOB: Dec 16, 1950, 67 y.o.   MRN: [id number]  Chief Complaint  Patient presents with  . Follow-up    Much better feels like anibiotic has cleared her symptoms up and has not needed her zopenex.    Referring provider: Prince Solian, MD  HPI:  68 year old female never smoker followed in our office for asthmatic bronchitis and bronchiectasis  Past medical history: GERD Smoking history: Never smoker Maintenance: Breo Ellipta 200 Patient of Dr. Elsworth Soho  05/31/2019  - Visit   68 year old female never smoker followed in our office for asthmatic bronchitis and bronchiectasis.  Patient was treated telephonically 2 weeks ago for a bronchitic exacerbation with doxycycline and a short course of prednisone.  Patient reports that her wheezing, cough as well as symptoms have improved significantly.  She is still finishing up her course of doxycycline.  She continues to be maintained on Breo Ellipta 200.  She does not have a flutter valve at home.  Patient receives allergy shots every 1 to 2 weeks.  Patient is due for Pneumovax 23 Patient is due for high-dose flu vaccine   Tests:   PFT-10/22/12- within normal limits. FVC 2.76/92%, FEV1 2.16/99%, FEV1/FVC 0.78. Slight response to bronchodilator. TLC 83%, DLCO 94%. RAST 09/2012 Total IgE 24.3, elevated for grass pollens. ESR13,EOS11.6.  2014 hypersensitivity pneumonitis panel-negative  CT chest 02/2013 no obvious bronchiectasis, mild groundglass and scarring CT maxillary sinuses 09/2012-bilateral maxillary sinusitis and frontal sinus collection  11/17/2018-CT maxillofacial- overall stable Nasal sinus disease with stable moderate maxillary sinus mucosal thickening predominantly within the alveolar recesses, decreased mucosal thickening in the frontal sinuses and increased mucosal thickening of left sided ethmoid air cells  11/19/2018-CT chest high-res- mild cylindrical bronchiectasis throughout both lungs  with mild diffuse bronchial wall thickening not subsequently changed since 2014 chest CT, minimal scattered mucoid impaction, no significant tree-in-bud opacities, prominent patchy air trapping throughout both lungs indicative of small airways disease   FENO:  No results found for: NITRICOXIDE  PFT: No flowsheet data found.  Imaging: No results found.    Specialty Problems      Pulmonary Problems   Asthma with bronchitis   Seasonal and perennial allergic rhinitis    Allergy vaccine-Dr. Donneta Romberg LAB 10/18/12- Allergy Profile Total IgE 24.3, elevated for grass pollens. ESR 13, EOS 11.6.       Maxillary sinusitis, chronic    CT maxillofacial-to 03/08/2012 CT maxfac 10/25/12- chronic maxillary and frontal sinusitis  11/17/2018-CT maxillofacial- overall stable Nasal sinus disease with stable moderate maxillary sinus mucosal thickening predominantly within the alveolar recesses, decreased mucosal thickening in the frontal sinuses and increased mucosal thickening of left sided ethmoid air cells      Lung nodule seen on imaging study    Ground glass density on chest CT 10/25/12      Bronchiectasis with acute exacerbation ([place])    11/19/2018-CT chest high-res- mild cylindrical bronchiectasis throughout both lungs with mild diffuse bronchial wall thickening not subsequently changed since 2014 chest CT, minimal scattered mucoid impaction, no significant tree-in-bud opacities, prominent patchy air trapping throughout both lungs indicative of small airways disease      Bronchiectasis without complication (HCC)      Allergies  Allergen Reactions  . Codeine Nausea And Vomiting  . Penicillins Swelling and Other (See Comments)    Swelling  Of joints Did it involve swelling of the face/tongue/throat, SOB, or low BP? No Did it involve sudden or severe rash/hives, skin peeling, or any reaction  on the inside of your mouth or nose? No Did you need to seek medical attention at a hospital or doctor's  office? No When did it last happen? Childhood allergy If all above answers are "NO", may proceed with cephalosporin use.   . Latex Rash and Other (See Comments)    redness  . Sulfa Antibiotics Rash    Immunization History  Administered Date(s) Administered  . Influenza Split 07/21/2011, 08/19/2012  . Influenza, High Dose Seasonal PF 08/02/2017, 08/07/2018  . Tdap 02/23/2010  . Zoster 05/20/2013  . Zoster Recombinat (Shingrix) 10/04/2018, 12/04/2018   Needs Pneumovax 23 Needs high-dose flu vaccine  Patient plans [facility]  Past Medical History:  Diagnosis Date  . Allergy   . Anxiety   . Asthma   . Cataract 2018   removed  . Complication of anesthesia    drop in BP  . DDD (degenerative disc disease)   . Depression   . GERD (gastroesophageal reflux disease)   . Hemorrhoids   . Hyperlipidemia   . Vitamin D deficiency     Tobacco History: Social History   Tobacco Use  Smoking Status Never Smoker  Smokeless Tobacco Never Used   Counseling given: Yes  Continue to not smoke  Outpatient Encounter Medications as of 05/31/2019  Medication Sig  . acetaminophen (TYLENOL) 500 MG tablet Take 1,000 mg by mouth every 6 (six) hours as needed for moderate pain or headache.  . ARIPiprazole (ABILIFY) 2 MG tablet Take 2 mg by mouth daily.   Marland Kitchen aspirin 81 MG tablet Take 81 mg by mouth at bedtime.   . [facility] FIBER GUMMIES PO Take 2 each by mouth daily.   Marland Kitchen doxycycline (VIBRA-TABS) 100 MG tablet Take 1 tablet (100 mg total) by mouth 2 (two) times daily.  Marland Kitchen EPINEPHrine 0.3 mg/0.3 mL IJ SOAJ injection Inject 0.3 mg into the muscle as needed for anaphylaxis.   . fluticasone furoate-vilanterol (BREO ELLIPTA) 200-25 MCG/INH AEPB Inhale 1 puff into the lungs daily.  Marland Kitchen GLUCOSAMINE-CHONDROITIN-MSM PO Take 1 tablet by mouth 2 (two) times a day.  Marland Kitchen HYDROcodone-acetaminophen (NORCO) 5-325 MG tablet Take 1 tablet by mouth every 6 (six) hours as needed for moderate pain.  Marland Kitchen levalbuterol  (XOPENEX HFA) 45 MCG/ACT inhaler Inhale 1 puff into the lungs every 4 (four) hours as needed for wheezing or shortness of breath.  . Melatonin 5 MG TABS Take 20 mg by mouth at bedtime.   . meloxicam (MOBIC) 15 MG tablet Take 15 mg by mouth daily as needed for pain.   Marland Kitchen ondansetron (ZOFRAN ODT) 4 MG disintegrating tablet Take 1 tablet (4 mg total) by mouth every 8 (eight) hours as needed for nausea or vomiting.  . pantoprazole (PROTONIX) 40 MG tablet Take 40 mg by mouth 2 (two) times daily.  Marland Kitchen PARoxetine (PAXIL) 40 MG tablet Take 40 mg by mouth daily.   . pravastatin (PRAVACHOL) 40 MG tablet Take 40 mg by mouth at bedtime.   . predniSONE (DELTASONE) 10 MG tablet Take 2 tablets (50m total) daily for the next 5 days. Take in the AM with food.  .Marland KitchenSINGULAIR 10 MG tablet Take 10 mg by mouth at bedtime.   . Vitamin D, Ergocalciferol, (DRISDOL) 50000 UNITS CAPS Take 50,000 Units by mouth every Monday.    No facility-administered encounter medications on file as of 05/31/2019.      Review of Systems  Review of Systems  Constitutional: Negative for activity change, fatigue and fever.  HENT: Negative  for sinus pressure, sinus pain and sore throat.   Respiratory: Negative for cough, shortness of breath and wheezing.   Cardiovascular: Negative for chest pain, palpitations and leg swelling.  Gastrointestinal: Negative for diarrhea, nausea and vomiting.  Musculoskeletal: Negative for arthralgias.  Neurological: Negative for dizziness.  Psychiatric/Behavioral: Negative for sleep disturbance. The patient is not nervous/anxious.      Physical Exam  BP 104/62 (BP Location: Left Arm, Cuff Size: Normal)   Pulse 94   Temp (!) 97.2 F (36.2 C) (Oral)   Ht '5\' 4"'  (1.626 m)   Wt 157 lb 12.8 oz (71.6 kg)   LMP 09/04/2002 (Exact Date)   SpO2 94%   BMI 27.09 kg/m   Wt Readings from Last 5 Encounters:  05/31/19 157 lb 12.8 oz (71.6 kg)  04/18/19 156 lb 14.4 oz (71.2 kg)  10/18/18 158 lb 3.2 oz (71.8  kg)  08/01/18 153 lb 9.6 oz (69.7 kg)  03/13/18 148 lb (67.1 kg)    Physical Exam Vitals signs and nursing note reviewed.  Constitutional:      General: She is not in acute distress.    Appearance: Normal appearance.  HENT:     Head: Normocephalic and atraumatic.     Right Ear: Tympanic membrane, ear canal and external ear normal. There is no impacted cerumen.     Left Ear: Tympanic membrane, ear canal and external ear normal. There is no impacted cerumen.     Nose: Rhinorrhea present. No congestion.     Mouth/Throat:     Mouth: Mucous membranes are moist.     Pharynx: Oropharynx is clear.  Eyes:     Pupils: Pupils are equal, round, and reactive to light.  Neck:     Musculoskeletal: Normal range of motion.  Cardiovascular:     Rate and Rhythm: Normal rate and regular rhythm.     Pulses: Normal pulses.     Heart sounds: Normal heart sounds. No murmur.  Pulmonary:     Effort: Pulmonary effort is normal. No respiratory distress.     Breath sounds: Normal breath sounds. No decreased air movement. No decreased breath sounds, wheezing or rales.  Skin:    General: Skin is warm and dry.     Capillary Refill: Capillary refill takes less than 2 seconds.  Neurological:     General: No focal deficit present.     Mental Status: She is alert and oriented to person, place, and time. Mental status is at baseline.     Gait: Gait normal.  Psychiatric:        Mood and Affect: Mood normal.        Behavior: Behavior normal.        Thought Content: Thought content normal.        Judgment: Judgment normal.      Lab Results:  CBC    Component Value Date/Time   WBC 11.1 (H) 04/18/2019 1500   RBC 4.54 04/18/2019 1500   HGB 14.3 04/18/2019 1500   HCT 44.2 04/18/2019 1500   PLT 214 04/18/2019 1500   MCV 97.4 04/18/2019 1500   MCH 31.5 04/18/2019 1500   MCHC 32.4 04/18/2019 1500   RDW 13.4 04/18/2019 1500   LYMPHSABS 1.5 10/18/2012 1126   MONOABS 0.4 10/18/2012 1126   EOSABS 0.7  10/18/2012 1126   BASOSABS 0.0 10/18/2012 1126    BMET    Component Value Date/Time   NA 143 10/18/2012 1126   K 4.4 10/18/2012 1126   CL 108 10/18/2012  1126   CO2 27 10/18/2012 1126   GLUCOSE 96 10/18/2012 1126   BUN 11 10/18/2012 1126   CREATININE 0.9 10/18/2012 1126   CALCIUM 9.7 10/18/2012 1126    BNP No results found for: BNP  ProBNP No results found for: PROBNP    Assessment & Plan:   Seasonal and perennial allergic rhinitis Plan: Resume Xyzal daily Continue Singulair Continue follow-up with allergy shots  Asthma with bronchitis Plan: Continue Breo Ellipta 200 Resume Xyzal daily Continue Singulair daily   Bronchiectasis without complication ([place]) Plan: Start flutter valve today Finish up doxycycline We recommend that you receive your Pneumovax 23 as well as high-dose flu vaccines Maintain healthy diet Increase daily physical activity  GERD (gastroesophageal reflux disease) Plan: Continue Protonix 40 mg twice daily Follow lifestyle management recommendations on AVS If you continue to have breakthrough reflux then likely you will need a referral to GI  Healthcare maintenance Plan: Pneumovax 23 is due, patient to receive at pharmacy High-dose flu vaccine is due, patient to receive at pharmacy    Return in about 3 months (around 08/30/2019), or if symptoms worsen or fail to improve, for Follow up with Dr. Elsworth Soho.   Lauraine Rinne, NP 05/31/2019   This appointment was 28 minutes long with over 50% of the time in direct face-to-face patient care, assessment, plan of care, and follow-up.

## 2019-05-31 ENCOUNTER — Encounter: Payer: Self-pay | Admitting: Pulmonary Disease

## 2019-05-31 ENCOUNTER — Other Ambulatory Visit: Payer: Self-pay

## 2019-05-31 ENCOUNTER — Ambulatory Visit (INDEPENDENT_AMBULATORY_CARE_PROVIDER_SITE_OTHER): Payer: Medicare Other | Admitting: Pulmonary Disease

## 2019-05-31 VITALS — BP 104/62 | HR 94 | Temp 97.2°F | Ht 64.0 in | Wt 157.8 lb

## 2019-05-31 DIAGNOSIS — J479 Bronchiectasis, uncomplicated: Secondary | ICD-10-CM

## 2019-05-31 DIAGNOSIS — J301 Allergic rhinitis due to pollen: Secondary | ICD-10-CM | POA: Diagnosis not present

## 2019-05-31 DIAGNOSIS — J302 Other seasonal allergic rhinitis: Secondary | ICD-10-CM | POA: Diagnosis not present

## 2019-05-31 DIAGNOSIS — J3089 Other allergic rhinitis: Secondary | ICD-10-CM | POA: Diagnosis not present

## 2019-05-31 DIAGNOSIS — J45909 Unspecified asthma, uncomplicated: Secondary | ICD-10-CM

## 2019-05-31 DIAGNOSIS — K219 Gastro-esophageal reflux disease without esophagitis: Secondary | ICD-10-CM | POA: Diagnosis not present

## 2019-05-31 MED ORDER — FLUTTER DEVI: 0 refills | Status: DC

## 2019-05-31 NOTE — Assessment & Plan Note (Signed)
Plan: Continue Breo Ellipta 200 Resume Xyzal daily Continue Singulair daily

## 2019-05-31 NOTE — Patient Instructions (Addendum)
You were seen today by Lauraine Rinne, NP  for:   1. Bronchiectasis without complication (HCC)  Bronchiectasis: This is the medical term which indicates that you have damage, dilated airways making you more susceptible to respiratory infection. Use a flutter valve 10 breaths twice a day or 4 to 5 breaths 4-5 times a day to help clear mucus out Let us know if you have cough with change in mucus color or fevers or chills.  At that point you would need an antibiotic. Maintain a healthy nutritious diet, eating whole foods Take your medications as prescribed   High-dose flu vaccine in fall/2020 Pneumovax 23 is due, you can [facility] or at next office visit here  Will need sputum culture 3 ways   2. Gastroesophageal reflux disease, esophagitis presence not specified  Protonix 40 mg tablet  >>>Please take 1 tablet daily 15 minutes to 30 minutes before your first meal of the day as well as before your other medications, also before dinner meal  >>>Try to take at the same time each day >>>take this medication daily  GERD management: >>>Avoid laying flat until 2 hours after meals >>>Elevate head of the bed including entire chest >>>Reduce size of meals and amount of fat, acid, spices, caffeine and sweets >>>If you are smoking, Please stop! >>>Decrease alcohol consumption >>>Work on maintaining a healthy weight with normal BMI   If you continue to have breakthrough reflux despite these medications likely would need referral to gastroenterologist  3. Seasonal and perennial allergic rhinitis  Resume Xyzal daily Continue allergy shots  4. Asthma with bronchitis  Breo Ellipta 200 >>> Take 1 puff daily in the morning right when you wake up >>>Rinse your mouth out after use >>>This is a daily maintenance inhaler, NOT a rescue inhaler >>>Contact our office if you are having difficulties affording or obtaining this medication >>>It is important for you to be able to  take this daily and not miss any doses    Resume Xyzal daily Continue Singulair  Only use your albuterol as a rescue medication to be used if you can't catch your breath by resting or doing a relaxed purse lip breathing pattern.  - The less you use it, the better it will work when you need it. - Ok to use up to 2 puffs  every 4 hours if you must but call for immediate appointment if use goes up over your usual need - Don't leave home without it !!  (think of it like the spare tire for your car)     Follow Up:    Return in about 3 months (around 08/30/2019), or if symptoms worsen or fail to improve, for Follow up with Dr. Elsworth Soho.   Please do your part to reduce the spread of COVID-19:      Reduce your risk of any infection  and COVID19 by using the similar precautions used for avoiding the common cold or flu:  Marland Kitchen Wash your hands often with soap and warm water for at least 20 seconds.  If soap and water are not readily available, use an alcohol-based hand sanitizer with at least 60% alcohol.  . If coughing or sneezing, cover your mouth and nose by coughing or sneezing into the elbow areas of your shirt or coat, into a tissue or into your sleeve (not your hands). Langley Gauss A MASK when in public  . Avoid shaking hands with others and consider head nods or verbal greetings only. Marland Kitchen  Avoid touching your eyes, nose, or mouth with unwashed hands.  . Avoid close contact with people who are sick. . Avoid places or events with large numbers of people in one location, like concerts or sporting events. . If you have some symptoms but not all symptoms, continue to monitor at home and seek medical attention if your symptoms worsen. . If you are having a medical emergency, call 911.   [place] / e-Visit: [email or web address]         [facility]: [facility]: [id number]                    [facility]: [id number]     It is flu season:   >>> Best ways to protect herself from the flu: Receive the yearly flu vaccine, practice good hand hygiene washing with soap and also using hand sanitizer when available, eat a nutritious meals, get adequate rest, hydrate appropriately   Please contact the office if your symptoms worsen or you have concerns that you are not improving.   Thank you for choosing [facility] for your healthcare, and for allowing [place] to partner with you on your healthcare journey. I am thankful to be able to provide care to you today.   Wyn Quaker FNP-C

## 2019-05-31 NOTE — Assessment & Plan Note (Signed)
Plan: Pneumovax 23 is due, patient to receive at pharmacy High-dose flu vaccine is due, patient to receive at pharmacy

## 2019-05-31 NOTE — Addendum Note (Signed)
Addended by: Lebron Conners on: 05/31/2019 03:16 PM   Modules accepted: Orders

## 2019-05-31 NOTE — Assessment & Plan Note (Signed)
Plan: Start flutter valve today Finish up doxycycline We recommend that you receive your Pneumovax 23 as well as high-dose flu vaccines Maintain healthy diet Increase daily physical activity

## 2019-05-31 NOTE — Assessment & Plan Note (Signed)
Plan: Resume Xyzal daily Continue Singulair Continue follow-up with allergy shots

## 2019-05-31 NOTE — Assessment & Plan Note (Signed)
Plan: Continue Protonix 40 mg twice daily Follow lifestyle management recommendations on AVS If you continue to have breakthrough reflux then likely you will need a referral to GI

## 2019-06-04 ENCOUNTER — Ambulatory Visit: Payer: Medicare Other | Admitting: Pulmonary Disease

## 2019-06-07 DIAGNOSIS — Z23 Encounter for immunization: Secondary | ICD-10-CM | POA: Diagnosis not present

## 2019-06-11 DIAGNOSIS — M25512 Pain in left shoulder: Secondary | ICD-10-CM | POA: Diagnosis not present

## 2019-06-13 DIAGNOSIS — M25512 Pain in left shoulder: Secondary | ICD-10-CM | POA: Diagnosis not present

## 2019-06-17 ENCOUNTER — Encounter

## 2019-06-17 DIAGNOSIS — M25512 Pain in left shoulder: Secondary | ICD-10-CM | POA: Diagnosis not present

## 2019-06-19 DIAGNOSIS — M25512 Pain in left shoulder: Secondary | ICD-10-CM | POA: Diagnosis not present

## 2019-06-19 MED ORDER — METFORMIN SR 500 MG 24 HR TABLET
500 mg | ORAL_TABLET | ORAL | 0 refills | Status: DC
Start: 2019-06-19 — End: 2019-10-14

## 2019-06-24 DIAGNOSIS — M25512 Pain in left shoulder: Secondary | ICD-10-CM | POA: Diagnosis not present

## 2019-06-26 ENCOUNTER — Ambulatory Visit (INDEPENDENT_AMBULATORY_CARE_PROVIDER_SITE_OTHER): Payer: Medicare Other | Admitting: Orthopaedic Surgery

## 2019-06-26 ENCOUNTER — Encounter: Payer: Self-pay | Admitting: Orthopaedic Surgery

## 2019-06-26 DIAGNOSIS — G8929 Other chronic pain: Secondary | ICD-10-CM

## 2019-06-26 DIAGNOSIS — M25512 Pain in left shoulder: Secondary | ICD-10-CM

## 2019-06-26 DIAGNOSIS — M7502 Adhesive capsulitis of left shoulder: Secondary | ICD-10-CM

## 2019-06-26 NOTE — Progress Notes (Signed)
Subjective: Patient is here for ultrasound-guided intra-articular left glenohumeral injection.  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  Objective: Very diminished overhead reach range of motion.  The back reach is slightly limited.  Tender mostly over the glenohumeral joint.  Procedure: Ultrasound-guided left glenohumeral injection: After sterile prep with Betadine, injected 8 cc 1% lidocaine without epinephrine and 40 mg methylprednisolone using a 22-gauge spinal needle, passing the needle through approach into the glenohumeral joint.  Injectate was seen filling the joint capsule.  She did not have much change in range of motion during the anesthetic phase.  She will follow-up in 1 months with Dr. Ninfa Linden.

## 2019-06-26 NOTE — Progress Notes (Signed)
Ms. Bestor is here today for continued follow-up of her left shoulder.  She is continue to follow-up 2 months after a manipulation under anesthesia with a left shoulder arthroscopy.  She feels like she is making some progress with physical therapy with her shoulder but there is still significant deficits with function.  Exam she still using her deltoid to abduct her shoulder and there is significant limitations with her range of motion.  I would like to have Dr. Junius Roads see her today for an intra-articular guidance knee steroid injection of the left shoulder glenohumeral joint and will continue her physical therapy.  We will see her back in follow-up in 4 to 6 weeks to see how she is doing overall.  All question concerns were answered and addressed.

## 2019-06-28 DIAGNOSIS — M25512 Pain in left shoulder: Secondary | ICD-10-CM | POA: Diagnosis not present

## 2019-07-01 DIAGNOSIS — M25512 Pain in left shoulder: Secondary | ICD-10-CM | POA: Diagnosis not present

## 2019-07-03 DIAGNOSIS — M25512 Pain in left shoulder: Secondary | ICD-10-CM | POA: Diagnosis not present

## 2019-07-05 DIAGNOSIS — M25512 Pain in left shoulder: Secondary | ICD-10-CM | POA: Diagnosis not present

## 2019-07-08 DIAGNOSIS — M25512 Pain in left shoulder: Secondary | ICD-10-CM | POA: Diagnosis not present

## 2019-07-10 DIAGNOSIS — M25512 Pain in left shoulder: Secondary | ICD-10-CM | POA: Diagnosis not present

## 2019-07-15 DIAGNOSIS — M25512 Pain in left shoulder: Secondary | ICD-10-CM | POA: Diagnosis not present

## 2019-07-17 DIAGNOSIS — M25512 Pain in left shoulder: Secondary | ICD-10-CM | POA: Diagnosis not present

## 2019-07-19 DIAGNOSIS — M25512 Pain in left shoulder: Secondary | ICD-10-CM | POA: Diagnosis not present

## 2019-07-22 DIAGNOSIS — M25512 Pain in left shoulder: Secondary | ICD-10-CM | POA: Diagnosis not present

## 2019-07-29 ENCOUNTER — Encounter: Payer: Self-pay | Admitting: Orthopaedic Surgery

## 2019-07-29 ENCOUNTER — Ambulatory Visit (INDEPENDENT_AMBULATORY_CARE_PROVIDER_SITE_OTHER): Payer: Medicare Other | Admitting: Orthopaedic Surgery

## 2019-07-29 DIAGNOSIS — M7502 Adhesive capsulitis of left shoulder: Secondary | ICD-10-CM

## 2019-07-29 NOTE — Progress Notes (Signed)
The patient is now over 3 months status post a left shoulder manipulation under anesthesia with an arthroscopy and anterior capsular release with subacromial decompression.  He is a very active 68 year old female.  She is still not progressed along in terms of her mobility and motion of that shoulder with continued pain.  Postoperatively we have tried a subacromial steroid injection and she has had now an intra-articular steroid injection under ultrasound by Dr. Junius Roads.  She still has limitations of her range of motion.  With her therapy notes her external rotation is 37 degrees, her internal rotation is 66 degrees, her forward flexion is 134 degrees and her abduction is 114 degrees.  These numbers of at least improved but are not where she would like them to be.  On exam certainly today there is still limitations in forward flexion as well as external rotation.  She still has problems with internal rotation adduction.  I do feel that it is worth trying 1 more manipulation under anesthesia but not an arthroscopic intervention.  I would want her to have physical therapy that same day in the afternoon as well as set her up for a CPM for her left shoulder.  She agrees with this treatment plan as well because of her do feel it is worth trying.  All question concerns were answered and addressed.  We would then see her back about 2 weeks after that surgery.

## 2019-08-01 ENCOUNTER — Other Ambulatory Visit: Payer: Self-pay | Admitting: Orthopaedic Surgery

## 2019-08-01 DIAGNOSIS — M24612 Ankylosis, left shoulder: Secondary | ICD-10-CM | POA: Diagnosis not present

## 2019-08-01 MED ORDER — ONDANSETRON 4 MG PO TBDP: 4.0000 mg | ORAL_TABLET | Freq: Three times a day (TID) | ORAL | 0 refills | Status: DC | PRN

## 2019-08-01 MED ORDER — OXYCODONE HCL 5 MG PO TABS: 5.0000 mg | ORAL_TABLET | Freq: Four times a day (QID) | ORAL | 0 refills | Status: DC | PRN

## 2019-08-02 DIAGNOSIS — J3089 Other allergic rhinitis: Secondary | ICD-10-CM | POA: Diagnosis not present

## 2019-08-02 DIAGNOSIS — M25512 Pain in left shoulder: Secondary | ICD-10-CM | POA: Diagnosis not present

## 2019-08-02 DIAGNOSIS — J301 Allergic rhinitis due to pollen: Secondary | ICD-10-CM | POA: Diagnosis not present

## 2019-08-05 ENCOUNTER — Other Ambulatory Visit: Payer: Self-pay

## 2019-08-05 ENCOUNTER — Ambulatory Visit (INDEPENDENT_AMBULATORY_CARE_PROVIDER_SITE_OTHER): Payer: Medicare Other | Admitting: Obstetrics and Gynecology

## 2019-08-05 ENCOUNTER — Encounter: Payer: Self-pay | Admitting: Obstetrics and Gynecology

## 2019-08-05 VITALS — BP 122/76 | HR 68 | Temp 97.4°F | Ht 64.0 in | Wt 161.6 lb

## 2019-08-05 DIAGNOSIS — Z124 Encounter for screening for malignant neoplasm of cervix: Secondary | ICD-10-CM

## 2019-08-05 DIAGNOSIS — Z01419 Encounter for gynecological examination (general) (routine) without abnormal findings: Secondary | ICD-10-CM

## 2019-08-05 DIAGNOSIS — B372 Candidiasis of skin and nail: Secondary | ICD-10-CM

## 2019-08-05 DIAGNOSIS — E2839 Other primary ovarian failure: Secondary | ICD-10-CM

## 2019-08-05 DIAGNOSIS — M8580 Other specified disorders of bone density and structure, unspecified site: Secondary | ICD-10-CM

## 2019-08-05 DIAGNOSIS — N952 Postmenopausal atrophic vaginitis: Secondary | ICD-10-CM | POA: Diagnosis not present

## 2019-08-05 MED ORDER — NYSTATIN 100000 UNIT/GM EX CREA: 1.0000 "application " | TOPICAL_CREAM | Freq: Two times a day (BID) | CUTANEOUS | 0 refills | Status: DC

## 2019-08-05 NOTE — Progress Notes (Addendum)
68 y.o. G0P0000 Married White or Caucasian Not Hispanic or Latino female here for annual exam.    She has been having serious issues with her left shoulder. Had surgery in June, not healing well, had scar tissue in the OR last week, in PT.   She is feeling depression, she is on medication. Situational, down with her shoulder and covid. She has a therapist friend she speaks with.     She is not sexually active, not using the vaginal estrogen. She would like to restart it, premarin too expensive. Her vagina is very dry.   Patient's last menstrual period was 09/04/2002 (exact date).          Sexually active: No.  The current method of family planning is post menopausal status.    Exercising: No.  The patient does not participate in regular exercise at present. Smoker:  no  Health Maintenance: Pap:07-13-16 WNL History of abnormal Pap:no MMG:09/21/2018 Birads 1 negative  Colonoscopy:11/07/2017 polyp, diverticulosis, f/u in 5 years.  BMD:08/31/2017 osteopenia, T score of -1.7, FRAX 15.6%/1.9% TDaP:02-23-10 Gardasil:N/A   reports that she has never smoked. She has never used smokeless tobacco. She reports that she does not drink alcohol or use drugs.  Past Medical History:  Diagnosis Date  . Allergy   . Anxiety   . Asthma   . Broncholithiasis   . Cataract 2018   removed  . Complication of anesthesia    drop in BP  . DDD (degenerative disc disease)   . Depression   . GERD (gastroesophageal reflux disease)   . Hemorrhoids   . Hyperlipidemia   . Vitamin D deficiency     Past Surgical History:  Procedure Laterality Date  . 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 cataract surgery    . COLONOSCOPY    . FOOT NEUROMA SURGERY     multiple times bilaterally  . PLANTAR FASCIA RELEASE    . SHOULDER ARTHROSCOPY Left 04/19/2019   Procedure: LEFT SHOULDER MANIPULATION UNDER ANESTHESIA AND ARTHROSCOPY WITH EXTENSIVE DEBRIDEMENT;  Surgeon: Mcarthur Rossetti, MD;  Location: [facility]  [facility];  Service: Orthopedics;  Laterality: Left;  . UPPER GI ENDOSCOPY      Current Outpatient Medications  Medication Sig Dispense Refill  . acetaminophen (TYLENOL) 500 MG tablet Take 1,000 mg by mouth every 6 (six) hours as needed for moderate pain or headache.    . ARIPiprazole (ABILIFY) 2 MG tablet Take 2 mg by mouth daily.     Marland Kitchen aspirin 81 MG tablet Take 81 mg by mouth at bedtime.     . [facility] FIBER GUMMIES PO Take 2 each by mouth daily.     Marland Kitchen EPINEPHrine 0.3 mg/0.3 mL IJ SOAJ injection Inject 0.3 mg into the muscle as needed for anaphylaxis.     . fluticasone furoate-vilanterol (BREO ELLIPTA) 200-25 MCG/INH AEPB Inhale 1 puff into the lungs daily.    Marland Kitchen GLUCOSAMINE-CHONDROITIN-MSM PO Take 1 tablet by mouth 2 (two) times a day.    . levalbuterol (XOPENEX HFA) 45 MCG/ACT inhaler Inhale 1 puff into the lungs every 4 (four) hours as needed for wheezing or shortness of breath.    . Melatonin 5 MG TABS Take 20 mg by mouth at bedtime.     . meloxicam (MOBIC) 15 MG tablet Take 15 mg by mouth daily as needed for pain.     Marland Kitchen oxyCODONE (ROXICODONE) 5 MG immediate release tablet Take 1-2 tablets (5-10 mg total) by mouth every 6 (six) hours as needed  for severe pain. 30 tablet 0  . pantoprazole (PROTONIX) 40 MG tablet Take 40 mg by mouth 2 (two) times daily.    Marland Kitchen PARoxetine (PAXIL) 40 MG tablet Take 40 mg by mouth daily.     . pravastatin (PRAVACHOL) 40 MG tablet Take 40 mg by mouth at bedtime.     Marland Kitchen SINGULAIR 10 MG tablet Take 10 mg by mouth at bedtime.     . Vitamin D, Ergocalciferol, (DRISDOL) 50000 UNITS CAPS Take 50,000 Units by mouth every Monday.      No current facility-administered medications for this visit.     Family History  Problem Relation Age of Onset  . Heart disease Mother   . Diabetes Mother   . Tuberculosis Paternal Grandfather   . Bone cancer Paternal Grandfather   . Breast cancer Sister 38  . Melanoma Sister   . Cirrhosis Maternal Grandfather   . Heart failure Paternal  Grandmother   . Colon cancer Neg Hx     Review of Systems  Constitutional: Negative.   HENT: Negative.   Eyes: Negative.   Respiratory: Negative.   Cardiovascular: Negative.   Gastrointestinal: Negative.   Endocrine: Negative.   Genitourinary:       Vaginal dryness  Musculoskeletal: Negative.   Skin: Negative.   Allergic/Immunologic: Negative.   Neurological: Negative.   Hematological: Negative.   Psychiatric/Behavioral: Negative.     Exam:   BP 122/76 (BP Location: Right Arm, Patient Position: Sitting, Cuff Size: Normal)   Pulse 68   Temp (!) 97.4 F (36.3 C) (Skin)   Ht 5\' 4"  (1.626 m)   Wt 161 lb 9.6 oz (73.3 kg)   LMP 09/04/2002 (Exact Date)   BMI 27.74 kg/m   Weight change: @WEIGHTCHANGE @ Height:   Height: 5\' 4"  (162.6 cm)  Ht Readings from Last 3 Encounters:  08/05/19 5\' 4"  (1.626 m)  05/31/19 5\' 4"  (1.626 m)  04/18/19 5\' 4"  (1.626 m)    General appearance: alert, cooperative and appears stated age Head: Normocephalic, without obvious abnormality, atraumatic Neck: no adenopathy, supple, symmetrical, trachea midline and thyroid normal to inspection and palpation Lungs: inspiratory wheezing Cardiovascular: regular rate and rhythm Breasts: normal appearance, no masses or tenderness Abdomen: soft, non-tender; non distended,  no masses,  no organomegaly Extremities: extremities normal, atraumatic, no cyanosis or edema Skin: Skin color, texture, turgor normal. No rashes or lesions Lymph nodes: Cervical, supraclavicular, and axillary nodes normal. No abnormal inguinal nodes palpated Neurologic: Grossly normal   Pelvic: External genitalia:  no lesions              Urethra:  normal appearing urethra with no masses, tenderness or lesions              Bartholins and Skenes: normal                 Vagina: atrophic appearing vagina with normal color and discharge, no lesions              Cervix: no lesions               Bimanual Exam:  Uterus:  normal size, contour,  position, consistency, mobility, non-tender              Adnexa: no mass, fullness, tenderness               Rectovaginal: Confirms               Anus:  normal sphincter tone, no lesions  Chaperone  was present for exam.  A:  Well Woman with normal exam  Osteopenia   Vaginal atrophy and discomfort  P:   No pap this year  Discussed breast self exam  Discussed calcium and vit D intake  Colonoscopy UTD  DEXA ordered  Will restart vaginal estrogen    Addendum: the patient had a mild, patchy erythematous rash under both breasts, bothersome. Will treat with nystatin.

## 2019-08-05 NOTE — Patient Instructions (Signed)
EXERCISE AND DIET:  We recommended that you start or continue a regular exercise program for good health. Regular exercise means any activity that makes your heart beat faster and makes you sweat.  We recommend exercising at least 30 minutes per day at least 3 days a week, preferably 4 or 5.  We also recommend a diet low in fat and sugar.  Inactivity, poor dietary choices and obesity can cause diabetes, heart attack, stroke, and kidney damage, among others.    ALCOHOL AND SMOKING:  Women should limit their alcohol intake to no more than 7 drinks/beers/glasses of wine (combined, not each!) per week. Moderation of alcohol intake to this level decreases your risk of breast cancer and liver damage. And of course, no recreational drugs are part of a healthy lifestyle.  And absolutely no smoking or even second hand smoke. Most people know smoking can cause heart and lung diseases, but did you know it also contributes to weakening of your bones? Aging of your skin?  Yellowing of your teeth and nails?  CALCIUM AND VITAMIN D:  Adequate intake of calcium and Vitamin D are recommended.  The recommendations for exact amounts of these supplements seem to change often, but generally speaking 1,200 mg of calcium (between diet and supplement) and 800 units of Vitamin D per day seems prudent. Certain women may benefit from higher intake of Vitamin D.  If you are among these women, your doctor will have told you during your visit.    PAP SMEARS:  Pap smears, to check for cervical cancer or precancers,  have traditionally been done yearly, although recent scientific advances have shown that most women can have pap smears less often.  However, every woman still should have a physical exam from her gynecologist every year. It will include a breast check, inspection of the vulva and vagina to check for abnormal growths or skin changes, a visual exam of the cervix, and then an exam to evaluate the size and shape of the uterus and  ovaries.  And after 70 years of age, a rectal exam is indicated to check for rectal cancers. We will also provide age appropriate advice regarding health maintenance, like when you should have certain vaccines, screening for sexually transmitted diseases, bone density testing, colonoscopy, mammograms, etc.   MAMMOGRAMS:  All women over 40 years old should have a yearly mammogram. Many facilities now offer a "3D" mammogram, which may cost around $50 extra out of pocket. If possible,  we recommend you accept the option to have the 3D mammogram performed.  It both reduces the number of women who will be called back for extra views which then turn out to be normal, and it is better than the routine mammogram at detecting truly abnormal areas.    COLON CANCER SCREENING: Now recommend starting at age 45. At this time colonoscopy is not covered for routine screening until 50. There are take home tests that can be done between 45-49.   COLONOSCOPY:  Colonoscopy to screen for colon cancer is recommended for all women at age 50.  We know, you hate the idea of the prep.  We agree, BUT, having colon cancer and not knowing it is worse!!  Colon cancer so often starts as a polyp that can be seen and removed at colonscopy, which can quite literally save your life!  And if your first colonoscopy is normal and you have no family history of colon cancer, most women don't have to have it again for   10 years.  Once every ten years, you can do something that may end up saving your life, right?  We will be happy to help you get it scheduled when you are ready.  Be sure to check your insurance coverage so you understand how much it will cost.  It may be covered as a preventative service at no cost, but you should check your particular policy.      Breast Self-Awareness Breast self-awareness means being familiar with how your breasts look and feel. It involves checking your breasts regularly and reporting any changes to your  health care provider. Practicing breast self-awareness is important. A change in your breasts can be a sign of a serious medical problem. Being familiar with how your breasts look and feel allows you to find any problems early, when treatment is more likely to be successful. All women should practice breast self-awareness, including women who have had breast implants. How to do a breast self-exam One way to learn what is normal for your breasts and whether your breasts are changing is to do a breast self-exam. To do a breast self-exam: Look for Changes  1. Remove all the clothing above your waist. 2. Stand in front of a mirror in a room with good lighting. 3. Put your hands on your hips. 4. Push your hands firmly downward. 5. Compare your breasts in the mirror. Look for differences between them (asymmetry), such as: ? Differences in shape. ? Differences in size. ? Puckers, dips, and bumps in one breast and not the other. 6. Look at each breast for changes in your skin, such as: ? Redness. ? Scaly areas. 7. Look for changes in your nipples, such as: ? Discharge. ? Bleeding. ? Dimpling. ? Redness. ? A change in position. Feel for Changes Carefully feel your breasts for lumps and changes. It is best to do this while lying on your back on the floor and again while sitting or standing in the shower or tub with soapy water on your skin. Feel each breast in the following way:  Place the arm on the side of the breast you are examining above your head.  Feel your breast with the other hand.  Start in the nipple area and make  inch (2 cm) overlapping circles to feel your breast. Use the pads of your three middle fingers to do this. Apply light pressure, then medium pressure, then firm pressure. The light pressure will allow you to feel the tissue closest to the skin. The medium pressure will allow you to feel the tissue that is a little deeper. The firm pressure will allow you to feel the tissue  close to the ribs.  Continue the overlapping circles, moving downward over the breast until you feel your ribs below your breast.  Move one finger-width toward the center of the body. Continue to use the  inch (2 cm) overlapping circles to feel your breast as you move slowly up toward your collarbone.  Continue the up and down exam using all three pressures until you reach your armpit.  Write Down What You Find  Write down what is normal for each breast and any changes that you find. Keep a written record with breast changes or normal findings for each breast. By writing this information down, you do not need to depend only on memory for size, tenderness, or location. Write down where you are in your menstrual cycle, if you are still menstruating. If you are having trouble noticing differences   in your breasts, do not get discouraged. With time you will become more familiar with the variations in your breasts and more comfortable with the exam. How often should I examine my breasts? Examine your breasts every month. If you are breastfeeding, the best time to examine your breasts is after a feeding or after using a breast pump. If you menstruate, the best time to examine your breasts is 5-7 days after your period is over. During your period, your breasts are lumpier, and it may be more difficult to notice changes. When should I see my health care provider? See your health care provider if you notice:  A change in shape or size of your breasts or nipples.  A change in the skin of your breast or nipples, such as a reddened or scaly area.  Unusual discharge from your nipples.  A lump or thick area that was not there before.  Pain in your breasts.  Anything that concerns you.  

## 2019-08-05 NOTE — Addendum Note (Signed)
Addended by: Dorothy Spark on: 08/05/2019 03:47 PM   Modules accepted: Orders

## 2019-08-06 ENCOUNTER — Other Ambulatory Visit: Payer: Self-pay

## 2019-08-06 MED ORDER — NONFORMULARY OR COMPOUNDED ITEM: 3 refills | Status: DC

## 2019-08-07 DIAGNOSIS — M25512 Pain in left shoulder: Secondary | ICD-10-CM | POA: Diagnosis not present

## 2019-08-07 DIAGNOSIS — J4530 Mild persistent asthma, uncomplicated: Secondary | ICD-10-CM | POA: Diagnosis not present

## 2019-08-07 DIAGNOSIS — H1045 Other chronic allergic conjunctivitis: Secondary | ICD-10-CM | POA: Diagnosis not present

## 2019-08-07 DIAGNOSIS — J301 Allergic rhinitis due to pollen: Secondary | ICD-10-CM | POA: Diagnosis not present

## 2019-08-08 ENCOUNTER — Other Ambulatory Visit: Payer: Medicare Other

## 2019-08-08 DIAGNOSIS — J479 Bronchiectasis, uncomplicated: Secondary | ICD-10-CM | POA: Diagnosis not present

## 2019-08-09 DIAGNOSIS — M25512 Pain in left shoulder: Secondary | ICD-10-CM | POA: Diagnosis not present

## 2019-08-09 LAB — RESPIRATORY CULTURE OR RESPIRATORY AND SPUTUM CULTURE: MICRO NUMBER:: 1119401

## 2019-08-09 NOTE — Progress Notes (Signed)
Please contact the patient and let them know that the sputum culture that he dropped off was deemed inadequate due to continued 25 or more squamous epithelial cells.  Patient will need to produce another sputum culture.  Please place an order for an additional sputum culture.  Please provide a cup for the patient if she does not have anymore.  Patient is also due for follow-up with Dr. Elsworth Soho.  She was last seen in our office in September/2020 where she was supposed to be scheduled for a 6-month office visit.  Please schedule with Dr. Elsworth Soho accordingly.  Wyn Quaker, FNP

## 2019-08-12 DIAGNOSIS — M25512 Pain in left shoulder: Secondary | ICD-10-CM | POA: Diagnosis not present

## 2019-08-13 NOTE — Progress Notes (Signed)
LMTCB

## 2019-08-14 ENCOUNTER — Telehealth: Payer: Self-pay | Admitting: Pulmonary Disease

## 2019-08-14 DIAGNOSIS — J3089 Other allergic rhinitis: Secondary | ICD-10-CM | POA: Diagnosis not present

## 2019-08-14 DIAGNOSIS — J301 Allergic rhinitis due to pollen: Secondary | ICD-10-CM | POA: Diagnosis not present

## 2019-08-14 DIAGNOSIS — M25512 Pain in left shoulder: Secondary | ICD-10-CM | POA: Diagnosis not present

## 2019-08-14 NOTE — Telephone Encounter (Signed)
Advised pt of results. Pt understood and nothing further is needed.    Notes recorded by Lauraine Rinne, NP on 08/09/2019 at 4:36 PM EST  Please contact the patient and let them know that the sputum culture that he dropped off was deemed inadequate due to continued 25 or more squamous epithelial cells.   Patient will need to produce another sputum culture. Please place an order for an additional sputum culture. Please provide a cup for the patient if she does not have anymore.   Patient is also due for follow-up with Dr. Elsworth Soho. She was last seen in our office in September/2020 where she was supposed to be scheduled for a 21-month office visit. Please schedule with Dr. Elsworth Soho accordingly.   Wyn Quaker, FNP

## 2019-08-19 ENCOUNTER — Other Ambulatory Visit: Payer: Self-pay | Admitting: Pulmonary Disease

## 2019-08-19 ENCOUNTER — Encounter: Payer: Self-pay | Admitting: Orthopaedic Surgery

## 2019-08-19 ENCOUNTER — Ambulatory Visit (INDEPENDENT_AMBULATORY_CARE_PROVIDER_SITE_OTHER): Payer: Medicare Other | Admitting: Orthopaedic Surgery

## 2019-08-19 ENCOUNTER — Other Ambulatory Visit: Payer: Self-pay

## 2019-08-19 DIAGNOSIS — M25512 Pain in left shoulder: Secondary | ICD-10-CM | POA: Diagnosis not present

## 2019-08-19 DIAGNOSIS — M7502 Adhesive capsulitis of left shoulder: Secondary | ICD-10-CM

## 2019-08-19 DIAGNOSIS — J479 Bronchiectasis, uncomplicated: Secondary | ICD-10-CM

## 2019-08-19 NOTE — Progress Notes (Deleted)
_0  ID: Alyssa Elliott, female    DOB: 05/02/1951, 68 y.o.   MRN: [id number]  No chief complaint on file.   Referring provider: Prince Solian, MD  HPI:  68 year old female never smoker followed in our office for asthmatic bronchitis and bronchiectasis  Past medical history: GERD Smoking history: Never smoker Maintenance: Breo Ellipta 200 Patient of Dr. Elsworth Soho  08/19/2019  - Visit     Questionaires / Pulmonary Flowsheets:   ACT:  No flowsheet data found.  MMRC: No flowsheet data found.  Epworth:  No flowsheet data found.  Tests:   PFT-10/22/12- within normal limits. FVC 2.76/92%, FEV1 2.16/99%, FEV1/FVC 0.78. Slight response to bronchodilator. TLC 83%, DLCO 94%. RAST 09/2012 Total IgE 24.3, elevated for grass pollens. ESR13,EOS11.6.  2014 hypersensitivity pneumonitis panel-negative  CT chest 02/2013 no obvious bronchiectasis, mild groundglass and scarring CT maxillary sinuses 09/2012-bilateral maxillary sinusitis and frontal sinus collection  11/17/2018-CT maxillofacial- overall stable Nasal sinus disease with stable moderate maxillary sinus mucosal thickening predominantly within the alveolar recesses, decreased mucosal thickening in the frontal sinuses and increased mucosal thickening of left sided ethmoid air cells  11/19/2018-CT chest high-res- mild cylindrical bronchiectasis throughout both lungs with mild diffuse bronchial wall thickening not subsequently changed since 2014 chest CT, minimal scattered mucoid impaction, no significant tree-in-bud opacities, prominent patchy air trapping throughout both lungs indicative of small airways disease  FENO:  No results found for: NITRICOXIDE  PFT: No flowsheet data found.  WALK:  No flowsheet data found.  Imaging: No results found.  Lab Results:  CBC    Component Value Date/Time   WBC 11.1 (H) 04/18/2019 1500   RBC 4.54 04/18/2019 1500   HGB 14.3 04/18/2019 1500   HCT 44.2 04/18/2019 1500   PLT  214 04/18/2019 1500   MCV 97.4 04/18/2019 1500   MCH 31.5 04/18/2019 1500   MCHC 32.4 04/18/2019 1500   RDW 13.4 04/18/2019 1500   LYMPHSABS 1.5 10/18/2012 1126   MONOABS 0.4 10/18/2012 1126   EOSABS 0.7 10/18/2012 1126   BASOSABS 0.0 10/18/2012 1126    BMET    Component Value Date/Time   NA 143 10/18/2012 1126   K 4.4 10/18/2012 1126   CL 108 10/18/2012 1126   CO2 27 10/18/2012 1126   GLUCOSE 96 10/18/2012 1126   BUN 11 10/18/2012 1126   CREATININE 0.9 10/18/2012 1126   CALCIUM 9.7 10/18/2012 1126    BNP No results found for: BNP  ProBNP No results found for: PROBNP  Specialty Problems      Pulmonary Problems   Asthma with bronchitis   Seasonal and perennial allergic rhinitis    Allergy vaccine-Dr. Donneta Romberg LAB 10/18/12- Allergy Profile Total IgE 24.3, elevated for grass pollens. ESR 13, EOS 11.6.       Maxillary sinusitis, chronic    CT maxillofacial-to 03/08/2012 CT maxfac 10/25/12- chronic maxillary and frontal sinusitis  11/17/2018-CT maxillofacial- overall stable Nasal sinus disease with stable moderate maxillary sinus mucosal thickening predominantly within the alveolar recesses, decreased mucosal thickening in the frontal sinuses and increased mucosal thickening of left sided ethmoid air cells      Lung nodule seen on imaging study    Ground glass density on chest CT 10/25/12      Bronchiectasis with acute exacerbation ([place])    11/19/2018-CT chest high-res- mild cylindrical bronchiectasis throughout both lungs with mild diffuse bronchial wall thickening not subsequently changed since 2014 chest CT, minimal scattered mucoid impaction, no significant tree-in-bud opacities, prominent patchy air trapping throughout both  lungs indicative of small airways disease      Bronchiectasis without complication (HCC)      Allergies  Allergen Reactions  . Codeine Nausea And Vomiting  . Penicillins Swelling and Other (See Comments)    Swelling  Of joints Did it involve  swelling of the face/tongue/throat, SOB, or low BP? No Did it involve sudden or severe rash/hives, skin peeling, or any reaction on the inside of your mouth or nose? No Did you need to seek medical attention at a hospital or doctor's office? No When did it last happen? Childhood allergy If all above answers are "NO", may proceed with cephalosporin use.   . Latex Rash and Other (See Comments)    redness  . Sulfa Antibiotics Rash    Immunization History  Administered Date(s) Administered  . Influenza Split 07/21/2011, 08/19/2012  . Influenza, High Dose Seasonal PF 08/02/2017, 08/07/2018  . Pneumococcal Polysaccharide-23 12/08/1999, 03/19/2008  . Td 09/20/1995, 10/20/2005, 12/29/2011  . Tdap 02/23/2010  . Zoster 05/20/2013  . Zoster Recombinat (Shingrix) 10/04/2018, 12/04/2018    Past Medical History:  Diagnosis Date  . Allergy   . Anxiety   . Asthma   . Broncholithiasis   . Cataract 2018   removed  . Complication of anesthesia    drop in BP  . DDD (degenerative disc disease)   . Depression   . GERD (gastroesophageal reflux disease)   . Hemorrhoids   . Hyperlipidemia   . Vitamin D deficiency     Tobacco History: Social History   Tobacco Use  Smoking Status Never Smoker  Smokeless Tobacco Never Used   Counseling given: Not Answered   Continue to not smoke  Outpatient Encounter Medications as of 08/20/2019  Medication Sig  . acetaminophen (TYLENOL) 500 MG tablet Take 1,000 mg by mouth every 6 (six) hours as needed for moderate pain or headache.  . ARIPiprazole (ABILIFY) 2 MG tablet Take 2 mg by mouth daily.   Marland Kitchen aspirin 81 MG tablet Take 81 mg by mouth at bedtime.   . [employer] FIBER GUMMIES PO Take 2 each by mouth daily.   Marland Kitchen EPINEPHrine 0.3 mg/0.3 mL IJ SOAJ injection Inject 0.3 mg into the muscle as needed for anaphylaxis.   . fluticasone furoate-vilanterol (BREO ELLIPTA) 200-25 MCG/INH AEPB Inhale 1 puff into the lungs daily.  Marland Kitchen GLUCOSAMINE-CHONDROITIN-MSM PO Take 1  tablet by mouth 2 (two) times a day.  . levalbuterol (XOPENEX HFA) 45 MCG/ACT inhaler Inhale 1 puff into the lungs every 4 (four) hours as needed for wheezing or shortness of breath.  . Melatonin 5 MG TABS Take 20 mg by mouth at bedtime.   . meloxicam (MOBIC) 15 MG tablet Take 15 mg by mouth daily as needed for pain.   . NONFORMULARY OR COMPOUNDED ITEM Estradiol 0.02% Insert 1 gram vaginally 2 times per week. 3 month supply with 3 refills.  . nystatin cream (MYCOSTATIN) Apply 1 application topically 2 (two) times daily. Apply to affected area BID for up to 7 days.  Marland Kitchen oxyCODONE (ROXICODONE) 5 MG immediate release tablet Take 1-2 tablets (5-10 mg total) by mouth every 6 (six) hours as needed for severe pain.  . pantoprazole (PROTONIX) 40 MG tablet Take 40 mg by mouth 2 (two) times daily.  Marland Kitchen PARoxetine (PAXIL) 40 MG tablet Take 40 mg by mouth daily.   . pravastatin (PRAVACHOL) 40 MG tablet Take 40 mg by mouth at bedtime.   Marland Kitchen SINGULAIR 10 MG tablet Take 10 mg by mouth at bedtime.   Marland Kitchen  Vitamin D, Ergocalciferol, (DRISDOL) 50000 UNITS CAPS Take 50,000 Units by mouth every Monday.    No facility-administered encounter medications on file as of 08/20/2019.      Review of Systems  Review of Systems   Physical Exam  LMP 09/04/2002 (Exact Date)   Wt Readings from Last 5 Encounters:  08/05/19 161 lb 9.6 oz (73.3 kg)  05/31/19 157 lb 12.8 oz (71.6 kg)  04/18/19 156 lb 14.4 oz (71.2 kg)  10/18/18 158 lb 3.2 oz (71.8 kg)  08/01/18 153 lb 9.6 oz (69.7 kg)    BMI Readings from Last 5 Encounters:  08/05/19 27.74 kg/m  05/31/19 27.09 kg/m  04/18/19 26.93 kg/m  10/18/18 28.02 kg/m  08/01/18 27.21 kg/m     Physical Exam    Assessment & Plan:   No problem-specific Assessment & Plan notes found for this encounter.    No follow-ups on file.   Lauraine Rinne, NP 08/19/2019   This appointment was *** minutes long with over 50% of the time in direct face-to-face patient care,  assessment, plan of care, and follow-up.

## 2019-08-19 NOTE — Progress Notes (Signed)
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  We just took her to the operating room recently for a manipulation under anesthesia.  We had previously manipulated her shoulder and performed a capsular release.  I was able to fully abduct her shoulder at the manipulation.  Since then she is been going through aggressive physical therapy but is lost her motion.  Her abduction is only about 90 degrees.  Her external rotation is better by 17 degrees at about 31 degrees.  She is also lost motion in general.  Her forward flexion is to 130 degrees which is improved from 125 degrees.  At this point it is essential she continue therapy and we need to work on getting her into some type of passive shoulder device including a JAS splint that she would wear 30 minutes at a time 3 times a day as well as a shoulder CPM such as a flexonator.  If we can have these things set up for her this may help improve her shoulder function.

## 2019-08-20 ENCOUNTER — Ambulatory Visit: Payer: Medicare Other | Admitting: Pulmonary Disease

## 2019-08-22 ENCOUNTER — Ambulatory Visit (INDEPENDENT_AMBULATORY_CARE_PROVIDER_SITE_OTHER): Payer: Medicare Other | Admitting: Pulmonary Disease

## 2019-08-22 ENCOUNTER — Other Ambulatory Visit: Payer: Self-pay

## 2019-08-22 ENCOUNTER — Encounter: Payer: Self-pay | Admitting: Pulmonary Disease

## 2019-08-22 DIAGNOSIS — J45909 Unspecified asthma, uncomplicated: Secondary | ICD-10-CM | POA: Diagnosis not present

## 2019-08-22 DIAGNOSIS — J471 Bronchiectasis with (acute) exacerbation: Secondary | ICD-10-CM

## 2019-08-22 DIAGNOSIS — M25512 Pain in left shoulder: Secondary | ICD-10-CM | POA: Diagnosis not present

## 2019-08-22 MED ORDER — DOXYCYCLINE HYCLATE 100 MG PO TABS: 100.0000 mg | ORAL_TABLET | Freq: Two times a day (BID) | ORAL | 0 refills | Status: DC

## 2019-08-22 NOTE — Patient Instructions (Signed)
Doxycycline 100 mg daily for 10 days  Sputum for culture and AFB  We discussed airway clearance strategies during flareup of bronchitis -Mucinex 600 twice daily for 7 days -Flutter valve twice daily -Can provide you with nebulizer and saline nebs if severe flareup

## 2019-08-22 NOTE — Assessment & Plan Note (Signed)
She seems to have an exacerbation Doxycycline 100 mg daily for 10 days  Sputum for culture and AFB  We discussed airway clearance strategies during flareup of bronchitis -Mucinex 600 twice daily for 7 days -Flutter valve twice daily -Can provide you with nebulizer and saline nebs if severe

## 2019-08-22 NOTE — Progress Notes (Signed)
I connected with  Alyssa Elliott on 08/22/19 by phone  and verified that I am speaking with the correct person using two identifiers.   I discussed the limitations of evaluation and management by telemedicine. The patient expressed understanding and agreed to proceed.  68 year old never smoker for follow-up of recurrent attacks of bronchitis and asthma  She has been maintained on Breo, Symbicort caused a raspy voice and cough She has flareups about twice a year requiring antibiotics. She is having flareup right now with cough productive of green sputum ongoing for 1 week, no fevers or other viral symptoms.  No sick contacts. She has been masking and social distancing during the pandemic  We reviewed CT of sinuses and chest  Alyssa Elliott has worked well overall, her allergist is happy with her asthma control She is planning on facelift surgery in February   Significant tests/ events reviewed  10/2018-CT maxillofacial- overall stable Nasal sinus disease with stable moderate maxillary sinus mucosal thickening predominantly within the alveolar recesses, decreased mucosal thickening in the frontal sinuses and increased mucosal thickening of left sided ethmoid air cells  11/2018-CT chest high-res- mild cylindrical bronchiectasis throughout both lungs with mild diffuse bronchial wall thickening not subsequently changed since 2014 chest CT, minimal scattered mucoid impaction, no significant tree-in-bud opacities, prominent patchy air trapping throughout both lungs indicative of small airways disease   PFT-10/22/12- within normal limits. FVC 2.76/92%, FEV1 2.16/99%, FEV1/FVC 0.78. Slight response to bronchodilator. TLC 83%, DLCO 94%. RAST 09/2012 Total IgE 24.3, elevated for grass pollens. ESR13,EOS11.6.  CT chest 02/2013 no obvious bronchiectasis, mild groundglass and scarring CT maxillary sinuses 09/2012-bilateral maxillary sinusitis and frontal sinus collection     Exam-no wheezing per  patient, unable to perform since phone visit   Impression and plan Per problem list charting  Total time x 86m Rakesh V. AElsworth SohoMD

## 2019-08-22 NOTE — Assessment & Plan Note (Signed)
Continue on Breo 200 once daily, rinse mouth after use

## 2019-08-26 ENCOUNTER — Telehealth: Payer: Self-pay

## 2019-08-26 DIAGNOSIS — M25512 Pain in left shoulder: Secondary | ICD-10-CM | POA: Diagnosis not present

## 2019-08-26 NOTE — Telephone Encounter (Signed)
Medicare would not cover the [facility] Shoulder Flexionator for her

## 2019-08-29 ENCOUNTER — Ambulatory Visit: Payer: Medicare Other | Admitting: Pulmonary Disease

## 2019-08-29 DIAGNOSIS — M25512 Pain in left shoulder: Secondary | ICD-10-CM | POA: Diagnosis not present

## 2019-09-02 ENCOUNTER — Encounter: Payer: Self-pay | Admitting: Pulmonary Disease

## 2019-09-02 ENCOUNTER — Ambulatory Visit (INDEPENDENT_AMBULATORY_CARE_PROVIDER_SITE_OTHER): Payer: Medicare Other

## 2019-09-02 ENCOUNTER — Ambulatory Visit (INDEPENDENT_AMBULATORY_CARE_PROVIDER_SITE_OTHER): Payer: Medicare Other | Admitting: Pulmonary Disease

## 2019-09-02 ENCOUNTER — Telehealth: Payer: Self-pay | Admitting: Pulmonary Disease

## 2019-09-02 ENCOUNTER — Other Ambulatory Visit: Payer: Self-pay

## 2019-09-02 DIAGNOSIS — R05 Cough: Secondary | ICD-10-CM | POA: Diagnosis not present

## 2019-09-02 DIAGNOSIS — R059 Cough, unspecified: Secondary | ICD-10-CM

## 2019-09-02 DIAGNOSIS — J471 Bronchiectasis with (acute) exacerbation: Secondary | ICD-10-CM | POA: Diagnosis not present

## 2019-09-02 MED ORDER — CEFDINIR 300 MG PO CAPS: 300.0000 mg | ORAL_CAPSULE | Freq: Two times a day (BID) | ORAL | 0 refills | Status: DC

## 2019-09-02 MED ORDER — LEVALBUTEROL TARTRATE 45 MCG/ACT IN AERO: 1.0000 | INHALATION_SPRAY | RESPIRATORY_TRACT | 3 refills | Status: DC | PRN

## 2019-09-02 MED ORDER — PREDNISONE 10 MG PO TABS: ORAL_TABLET | ORAL | 0 refills | Status: DC

## 2019-09-02 NOTE — Telephone Encounter (Signed)
Please arrange for follow-up appointment with chest x-ray today

## 2019-09-02 NOTE — Progress Notes (Signed)
   Subjective:    Patient ID: Alyssa Elliott, female    DOB: 1951-09-10, 68 y.o.   MRN: [id number]  HPI  68 year old never smoker with mild bronchiectasis for follow-up of recurrent attacks of bronchitis and asthma  She has been maintained on Breo, Symbicort caused a raspy voice and cough She has flareups about twice a year requiring antibiotics.  She is planning on facelift surgery in February  Televisit 12/3-cough for 2 to 3 weeks given doxy for 10 days, she is no better still complains of cough with yellow to green sputum production. No dyspnea but occasional wheezing. She generally takes 2-3 rounds of antibiotics to improve  No fevers or sick contacts, with social distancing and masking appropriately during the pandemic   Significant tests/ events reviewed  10/2018-CT maxillofacial-overall stable Nasal sinus disease with stable moderate maxillary sinus mucosal thickening predominantly within the alveolar recesses, decreased mucosal thickening in the frontal sinuses and increased mucosal thickening of left sided ethmoid air cells  11/2018-CT chest high-res-mild cylindrical bronchiectasis throughout both lungs with mild diffuse bronchial wall thickening not subsequently changed since 2014 chest CT, minimal scattered mucoid impaction, no significant tree-in-bud opacities, prominent patchy air trapping throughout both lungs indicative of small airways disease   PFT-10/22/12- within normal limits. FVC 2.76/92%, FEV1 2.16/99%, FEV1/FVC 0.78. Slight response to bronchodilator. TLC 83%, DLCO 94%. RAST 1/2014Total IgE 24.3, elevated for grass pollens. ESR13,EOS11.6.  CT chest 02/2013 no obvious bronchiectasis, mild groundglass and scarring CT maxillary sinuses 09/2012-bilateral maxillary sinusitis and frontal sinus collection  Review of Systems neg for any significant sore throat, dysphagia, itching, sneezing, nasal congestion , fever, chills, sweats, unintended wt loss,  pleuritic or exertional cp, hempoptysis, orthopnea pnd or change in chronic leg swelling. Also denies presyncope, palpitations, heartburn, abdominal pain, nausea, vomiting, diarrhea or change in bowel or urinary habits, dysuria,hematuria, rash, arthralgias, visual complaints, headache, numbness weakness or ataxia.     Objective:   Physical Exam  Gen. Pleasant, well-nourished, in no distress ENT - no thrush, no pallor/icterus,no post nasal drip Neck: No JVD, no thyromegaly, no carotid bruits Lungs: no use of accessory muscles, no dullness to percussion, bibasal fine rales ,faint exp rhonchi p-cough  Cardiovascular: Rhythm regular, heart sounds  normal, no murmurs or gallops, no peripheral edema Musculoskeletal: No deformities, no cyanosis or clubbing        Assessment & Plan:

## 2019-09-02 NOTE — Addendum Note (Signed)
Addended by: Suzzanne Cloud E on: 09/02/2019 04:02 PM   Modules accepted: Orders

## 2019-09-02 NOTE — Telephone Encounter (Signed)
Spoke with patient.  She has taken her 10 day course of doxy. She doesn't feel better. She is hoarse, coughing up yellow-brown sputum.  She hasn't been able to bring sputum in due to the 4 hour window. She took mucinex 600 bid for 2-3 days didn't feel like it was helping. She is going to dial up her flutter valve to see if it will rattle her chest more to cough up mucus.  Patient denies fevers, wants to know if she needs to come in and see Dr. Elsworth Soho.   Dr. Elsworth Soho please advise if patient needs to come in this afternoon, or any further recommendations.

## 2019-09-02 NOTE — Assessment & Plan Note (Signed)
Slow to resolve bronchiectatic exacerbation Omnicef 300 mg twice daily for 7 days. Prednisone 10 mg tabs Take 4 tabs  daily with food x 3 days, then 3 tabs daily x 3 days, then 2 tabs daily x 3 days, then 1 tab daily x3 days then stop. #30  Refill on Xopenex. Check sputum culture and AFB  Stay on Mucinex 600 mg daytime  If no better in 7 days, then we will consider Covid testing and add hypertonic saline nebs

## 2019-09-02 NOTE — Telephone Encounter (Signed)
Called and spoke to patient. Scheduled her to see Dr. Elsworth Soho per his request. Order placed for x-ray prior to appointment. Nothing further needed at this time.

## 2019-09-02 NOTE — Patient Instructions (Signed)
Omnicef 300 mg twice daily for 7 days. Prednisone 10 mg tabs Take 4 tabs  daily with food x 3 days, then 3 tabs daily x 3 days, then 2 tabs daily x 3 days, then 1 tab daily x3 days then stop. #30  Refill on Xopenex. Check sputum culture and AFB  Stay on Mucinex 600 mg daytime  If no better in 7 days, then we will consider Covid testing

## 2019-09-05 LAB — RESPIRATORY CULTURE OR RESPIRATORY AND SPUTUM CULTURE
MICRO NUMBER:: 1195642
RESULT:: NORMAL
SPECIMEN QUALITY:: ADEQUATE

## 2019-09-16 ENCOUNTER — Ambulatory Visit: Payer: Medicare Other | Admitting: Orthopaedic Surgery

## 2019-09-25 ENCOUNTER — Telehealth: Payer: Self-pay | Admitting: Orthopaedic Surgery

## 2019-09-25 NOTE — Telephone Encounter (Signed)
I have signed paperwork. I will fax back.

## 2019-09-25 NOTE — Telephone Encounter (Signed)
Gina from [facility] called. She would like to know if Dr. Ninfa [facility] received the faxes that they sent. Her call back number is [phone] ext [phone]

## 2019-09-25 NOTE — Telephone Encounter (Signed)
Do you remember seeing this? I'm not sure what this is?

## 2019-09-30 DIAGNOSIS — E559 Vitamin D deficiency, unspecified: Secondary | ICD-10-CM | POA: Diagnosis not present

## 2019-09-30 DIAGNOSIS — E7849 Other hyperlipidemia: Secondary | ICD-10-CM | POA: Diagnosis not present

## 2019-10-03 DIAGNOSIS — R82998 Other abnormal findings in urine: Secondary | ICD-10-CM | POA: Diagnosis not present

## 2019-10-07 DIAGNOSIS — E559 Vitamin D deficiency, unspecified: Secondary | ICD-10-CM | POA: Diagnosis not present

## 2019-10-07 DIAGNOSIS — J45909 Unspecified asthma, uncomplicated: Secondary | ICD-10-CM | POA: Diagnosis not present

## 2019-10-07 DIAGNOSIS — Z0000 Encounter for general adult medical examination without abnormal findings: Secondary | ICD-10-CM | POA: Diagnosis not present

## 2019-10-07 DIAGNOSIS — J301 Allergic rhinitis due to pollen: Secondary | ICD-10-CM | POA: Diagnosis not present

## 2019-10-07 DIAGNOSIS — D126 Benign neoplasm of colon, unspecified: Secondary | ICD-10-CM | POA: Diagnosis not present

## 2019-10-07 DIAGNOSIS — J3089 Other allergic rhinitis: Secondary | ICD-10-CM | POA: Diagnosis not present

## 2019-10-07 DIAGNOSIS — E785 Hyperlipidemia, unspecified: Secondary | ICD-10-CM | POA: Diagnosis not present

## 2019-10-07 DIAGNOSIS — M5030 Other cervical disc degeneration, unspecified cervical region: Secondary | ICD-10-CM | POA: Diagnosis not present

## 2019-10-07 DIAGNOSIS — K5909 Other constipation: Secondary | ICD-10-CM | POA: Diagnosis not present

## 2019-10-07 DIAGNOSIS — M7502 Adhesive capsulitis of left shoulder: Secondary | ICD-10-CM | POA: Diagnosis not present

## 2019-10-07 DIAGNOSIS — J309 Allergic rhinitis, unspecified: Secondary | ICD-10-CM | POA: Diagnosis not present

## 2019-10-07 DIAGNOSIS — Z8249 Family history of ischemic heart disease and other diseases of the circulatory system: Secondary | ICD-10-CM | POA: Diagnosis not present

## 2019-10-07 DIAGNOSIS — K219 Gastro-esophageal reflux disease without esophagitis: Secondary | ICD-10-CM | POA: Diagnosis not present

## 2019-10-07 DIAGNOSIS — F329 Major depressive disorder, single episode, unspecified: Secondary | ICD-10-CM | POA: Diagnosis not present

## 2019-10-08 ENCOUNTER — Encounter

## 2019-10-08 NOTE — Telephone Encounter (Signed)
Patient is requesting a refill on Norvasc,Gabapentin,Insulin,Metaphormin, and  Diovan. Please refill medication as soon as possible.

## 2019-10-11 ENCOUNTER — Telehealth: Payer: Self-pay | Admitting: Radiology

## 2019-10-11 NOTE — Telephone Encounter (Signed)
Sent notes to Warren with [facility] for brace

## 2019-10-14 NOTE — Telephone Encounter (Signed)
Last visit 04/12/2019 NP Ivey   Next appointment 11/05/2019 NP Ivey   Previous refill encounter(s)   06/18/2018 Glucophage XR #180,   04/15/2019 Diovan-HCT #90,   04/12/2019 Norvasc #90,   10/30/2018:  - Levemor #5 with 3 refills,   - Neurontin #90 with 3 refills,  02/23/2018 Nano Pen Needle #100 with 3 refills.     Requested Prescriptions     Pending Prescriptions Disp Refills   ??? amLODIPine (NORVASC) 10 mg tablet 30 Tab 0     Sig: Take 1 Tab by mouth daily.   ??? gabapentin (NEURONTIN) 300 mg capsule 30 Cap 0     Sig: Take 1 Cap by mouth nightly. Max Daily Amount: 300 mg.   ??? insulin detemir U-100 (Levemir FlexTouch U-100 Insuln) 100 unit/mL (3 mL) inpn 5 Adjustable Dose Pre-filled Pen Syringe 0     Sig: 20 Units by SubCUTAneous route daily (with breakfast).   ??? metFORMIN ER (GLUCOPHAGE XR) 500 mg tablet 60 Tab 0     Sig: Take 2 Tabs by mouth daily (with dinner).   ??? valsartan-hydroCHLOROthiazide (DIOVAN-HCT) 160-25 mg per tablet 30 Tab 0     Sig: Take 1 Tab by mouth daily.   ??? Insulin Needles, Disposable, (Nano Pen Needle) 32 gauge x 5/32" ndle 100 Pen Needle 0     Sig: Use with insulin pen 3 times daily

## 2019-10-15 MED ORDER — GABAPENTIN 300 MG CAP
300 mg | ORAL_CAPSULE | Freq: Every evening | ORAL | 0 refills | Status: DC
Start: 2019-10-15 — End: 2020-08-21

## 2019-10-15 MED ORDER — LEVEMIR FLEXTOUCH U-100 INSULIN 100 UNIT/ML (3 ML) SUBCUTANEOUS PEN
100 unit/mL (3 mL) | Freq: Every day | SUBCUTANEOUS | 0 refills | Status: DC
Start: 2019-10-15 — End: 2019-12-12

## 2019-10-15 MED ORDER — PEN NEEDLE, DIABETIC 32 GAUGE X 5/32"
32 gauge x 5/" | PEN_INJECTOR | 0 refills | Status: AC
Start: 2019-10-15 — End: ?

## 2019-10-15 MED ORDER — VALSARTAN-HYDROCHLOROTHIAZIDE 160 MG-25 MG TAB
160-25 mg | ORAL_TABLET | Freq: Every day | ORAL | 0 refills | Status: DC
Start: 2019-10-15 — End: 2019-12-06

## 2019-10-15 MED ORDER — AMLODIPINE 10 MG TAB
10 mg | ORAL_TABLET | Freq: Every day | ORAL | 0 refills | Status: DC
Start: 2019-10-15 — End: 2020-07-20

## 2019-10-15 MED ORDER — METFORMIN SR 500 MG 24 HR TABLET
500 mg | ORAL_TABLET | Freq: Every day | ORAL | 0 refills | Status: DC
Start: 2019-10-15 — End: 2019-12-06

## 2019-10-18 DIAGNOSIS — Z1212 Encounter for screening for malignant neoplasm of rectum: Secondary | ICD-10-CM | POA: Diagnosis not present

## 2019-10-23 DIAGNOSIS — J301 Allergic rhinitis due to pollen: Secondary | ICD-10-CM | POA: Diagnosis not present

## 2019-10-23 DIAGNOSIS — J3089 Other allergic rhinitis: Secondary | ICD-10-CM | POA: Diagnosis not present

## 2019-10-28 ENCOUNTER — Telehealth: Payer: Self-pay | Admitting: Orthopaedic Surgery

## 2019-10-28 NOTE — Telephone Encounter (Signed)
Gina from [facility] called.   She isash following up on paperwork that was faxed over on the patient's behalf on 10/24/19 .  Call back number: [phone] Ext: [phone]

## 2019-10-28 NOTE — Telephone Encounter (Signed)
Signed order for JAS and records has been faxed. They now want something attesting Dr. Trevor Mace records are his dictated records. I will fax that back.

## 2019-10-28 NOTE — Telephone Encounter (Signed)
I saw in her chart that you had faxed something to these people already?

## 2019-11-04 DIAGNOSIS — J301 Allergic rhinitis due to pollen: Secondary | ICD-10-CM | POA: Diagnosis not present

## 2019-11-04 DIAGNOSIS — J3089 Other allergic rhinitis: Secondary | ICD-10-CM | POA: Diagnosis not present

## 2019-11-05 ENCOUNTER — Telehealth: Admit: 2019-11-05 | Payer: Medicare (Managed Care) | Attending: Family | Primary: Family

## 2019-11-05 ENCOUNTER — Telehealth: Attending: Family | Primary: Family

## 2019-11-05 DIAGNOSIS — I10 Essential (primary) hypertension: Secondary | ICD-10-CM

## 2019-11-05 NOTE — Progress Notes (Signed)
Virtual visit [phone]    Chief Complaint   Patient presents with   ??? Blood Pressure Check   0/10 pain today  No covid vaccine yet  Declined flu vaccine  Pt needs renewal for DMV paperwork. She will send in mail.  BS 112 fasting    1. Have you been to the ER, urgent care clinic since your last visit?  Hospitalized since your last visit?No    2. Have you seen or consulted any other health care providers outside of the [facility] since your last visit?  Include any pap smears or colon screening. No        Health Maintenance Due   Topic Date Due   ??? Eye Exam Retinal or Dilated  02/01/1959   ??? COVID-19 Vaccine (1 of 2) 01/31/1965   ??? DTaP/Tdap/Td series (1 - Tdap) 01/31/1970   ??? Shingrix Vaccine Age 79> (1 of 2) 02/01/1999   ??? Colorectal Cancer Screening Combo  02/01/1999   ??? GLAUCOMA SCREENING Q2Y  01/31/2014   ??? Breast Cancer Screen Mammogram  03/03/2016   ??? Medicare Yearly Exam  02/24/2018   ??? MICROALBUMIN Q1  02/24/2019   ??? Flu Vaccine (1) 05/21/2019   ??? A1C test (Diabetic or Prediabetic)  07/13/2019   ??? Foot Exam Q1  10/31/2019   ??? Lipid Screen  10/31/2019           3 most recent PHQ Screens 11/05/2019   Little interest or pleasure in doing things Not at all   Feeling down, depressed, irritable, or hopeless Not at all   Total Score PHQ 2 0           Learning Assessment 02/23/2017   PRIMARY LEARNER Patient   HIGHEST LEVEL OF EDUCATION - PRIMARY LEARNER  2 YEARS OF COLLEGE   PRIMARY LANGUAGE ENGLISH   LEARNER PREFERENCE PRIMARY DEMONSTRATION   ANSWERED BY Holmes         An After Visit Summary was printed and given to the patient.

## 2019-11-05 NOTE — Progress Notes (Signed)
Subjective  Lindsey Huynh is a 69 y.o. female presents for routine visit     Lindsey Huynh, who was evaluated through a synchronous (real-time) audio-video encounter, and/or her healthcare decision maker, is aware that it is a billable service, with coverage as determined by her insurance carrier. She provided verbal consent to proceed: Yes, and patient identification was verified. It was conducted pursuant to the emergency declaration under the [place], [place] waiver authority and the [employer] Act. A caregiver was present when appropriate. Ability to conduct physical exam was limited. I was in the office. The patient was at home.          HPI     She is compliant with medical management   Denies interim change in medical history  Fasting blood sugar 96-115  No blood pressure monitoring   Needs DMV form completed   She plans to schedule a IO visit with fasting labs    Needs Dexa scan  Mammogram current       Past Medical History:   Diagnosis Date   ??? Diabetes ([place])    ??? GERD (gastroesophageal reflux disease)    ??? Hypercholesterolemia    ??? Hypertension        Current Outpatient Medications   Medication Sig   ??? amLODIPine (NORVASC) 10 mg tablet Take 1 Tab by mouth daily.   ??? gabapentin (NEURONTIN) 300 mg capsule Take 1 Cap by mouth nightly. Max Daily Amount: 300 mg.   ??? insulin detemir U-100 (Levemir FlexTouch U-100 Insuln) 100 unit/mL (3 mL) inpn 20 Units by SubCUTAneous route daily (with breakfast).   ??? metFORMIN ER (GLUCOPHAGE XR) 500 mg tablet Take 2 Tabs by mouth daily (with dinner).   ??? valsartan-hydroCHLOROthiazide (DIOVAN-HCT) 160-25 mg per tablet Take 1 Tab by mouth daily.   ??? Insulin Needles, Disposable, (Nano Pen Needle) 32 gauge x 5/32" ndle Use with insulin pen 3 times daily   ??? cyclopentolate (CYCLOGYL) 1 % ophthalmic solution    ??? aspirin delayed-release 81 mg tablet Take 81 mg by mouth daily.    ??? omeprazole (PRILOSEC) 20 mg capsule Take 20 mg by mouth daily.     No current facility-administered medications for this visit.        Allergies   Allergen Reactions   ??? Neuromuscular Blockers, Steroidal Other (comments)     GI Upset     Review of Systems   Constitutional: Negative.    Eyes: Negative.    Respiratory: Negative.    Cardiovascular: Negative.    Gastrointestinal: Negative.    Genitourinary: Negative.        Objective  Physical Exam  Constitutional:       General: She is not in acute distress.     Appearance: Normal appearance. She is not ill-appearing.   Neurological:      Mental Status: She is alert.          Assessment & Plan    ICD-10-CM ICD-9-CM    1. 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 Hypercholesterolemia  E78.00 272.0        patient instructed to monitor both fasting and post prandial blood sugar, schedule appointment to be seen IO ASAP  reviewed diet, exercise and weight control  cardiovascular risk and specific lipid/LDL goals reviewed  specific diabetic recommendations: low cholesterol diet,  weight control and daily exercise discussed and annual eye examinations at Ophthalmology discussed     Patient voices understanding and acceptance of this advice and will call back if any further questions or concerns.  Karsten Fells, NP

## 2019-11-05 NOTE — Progress Notes (Signed)
Subjective  Lindsey Huynh is a 69 y.o. female presents for routine visit     Lindsey Huynh, who was evaluated through a synchronous (real-time) audio-video encounter, and/or her healthcare decision maker, is aware that it is a billable service, with coverage as determined by her insurance carrier. She provided verbal consent to proceed: Yes, and patient identification was verified. It was conducted pursuant to the emergency declaration under the [place], [place] waiver authority and the [employer] Act. A caregiver was present when appropriate. Ability to conduct physical exam was limited. I was in the office. The patient was at home.          HPI     She is compliant with medical management   Denies interim change in medical history  Fasting blood sugar 96-115  No blood pressure monitoring   Needs DMV form completed   She plans to schedule a IO visit with fasting labs    Needs Dexa scan  Mammogram current       Past Medical History:   Diagnosis Date   ??? Diabetes ([place])    ??? GERD (gastroesophageal reflux disease)    ??? Hypercholesterolemia    ??? Hypertension        Current Outpatient Medications   Medication Sig   ??? amLODIPine (NORVASC) 10 mg tablet Take 1 Tab by mouth daily.   ??? gabapentin (NEURONTIN) 300 mg capsule Take 1 Cap by mouth nightly. Max Daily Amount: 300 mg.   ??? insulin detemir U-100 (Levemir FlexTouch U-100 Insuln) 100 unit/mL (3 mL) inpn 20 Units by SubCUTAneous route daily (with breakfast).   ??? metFORMIN ER (GLUCOPHAGE XR) 500 mg tablet Take 2 Tabs by mouth daily (with dinner).   ??? valsartan-hydroCHLOROthiazide (DIOVAN-HCT) 160-25 mg per tablet Take 1 Tab by mouth daily.   ??? Insulin Needles, Disposable, (Nano Pen Needle) 32 gauge x 5/32" ndle Use with insulin pen 3 times daily   ??? cyclopentolate (CYCLOGYL) 1 % ophthalmic solution    ??? aspirin delayed-release 81 mg tablet Take 81 mg by mouth daily.   ???  omeprazole (PRILOSEC) 20 mg capsule Take 20 mg by mouth daily.     No current facility-administered medications for this visit.        Allergies   Allergen Reactions   ??? Neuromuscular Blockers, Steroidal Other (comments)     GI Upset     Review of Systems   Constitutional: Negative.    Eyes: Negative.    Respiratory: Negative.    Cardiovascular: Negative.    Gastrointestinal: Negative.    Genitourinary: Negative.        Objective  Physical Exam  Constitutional:       General: She is not in acute distress.     Appearance: Normal appearance. She is not ill-appearing.   Neurological:      Mental Status: She is alert.          Assessment & Plan    ICD-10-CM ICD-9-CM    1. 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 Hypercholesterolemia  E78.00 272.0        patient instructed to monitor both fasting and post prandial blood sugar, schedule appointment to be seen IO ASAP  reviewed diet, exercise and weight control  cardiovascular risk and specific lipid/LDL goals reviewed  specific diabetic recommendations: low cholesterol diet,  weight control and daily exercise discussed and annual eye examinations at Ophthalmology discussed     Patient voices understanding and acceptance of this advice and will call back if any further questions or concerns.  Karsten Fells, NP

## 2019-11-05 NOTE — Progress Notes (Signed)
Virtual visit [phone]    Chief Complaint   Patient presents with   . Blood Pressure Check   0/10 pain today  No covid vaccine yet  Declined flu vaccine  Pt needs renewal for DMV paperwork. She will send in mail.  BS 112 fasting    1. Have you been to the ER, urgent care clinic since your last visit?  Hospitalized since your last visit?No    2. Have you seen or consulted any other health care providers outside of the [facility] since your last visit?  Include any pap smears or colon screening. No        Health Maintenance Due   Topic Date Due   . Eye Exam Retinal or Dilated  02/01/1959   . COVID-19 Vaccine (1 of 2) 01/31/1965   . DTaP/Tdap/Td series (1 - Tdap) 01/31/1970   . Shingrix Vaccine Age 40> (1 of 2) 02/01/1999   . Colorectal Cancer Screening Combo  02/01/1999   . GLAUCOMA SCREENING Q2Y  01/31/2014   . Breast Cancer Screen Mammogram  03/03/2016   . Medicare Yearly Exam  02/24/2018   . MICROALBUMIN Q1  02/24/2019   . Flu Vaccine (1) 05/21/2019   . A1C test (Diabetic or Prediabetic)  07/13/2019   . Foot Exam Q1  10/31/2019   . Lipid Screen  10/31/2019           3 most recent PHQ Screens 11/05/2019   Little interest or pleasure in doing things Not at all   Feeling down, depressed, irritable, or hopeless Not at all   Total Score PHQ 2 0           Learning Assessment 02/23/2017   PRIMARY LEARNER Patient   HIGHEST LEVEL OF EDUCATION - PRIMARY LEARNER  2 YEARS OF COLLEGE   PRIMARY LANGUAGE ENGLISH   LEARNER PREFERENCE PRIMARY DEMONSTRATION   ANSWERED BY Sheila Oats   RELATIONSHIP SELF         An After Visit Summary was printed and given to the patient.

## 2019-11-08 DIAGNOSIS — J3089 Other allergic rhinitis: Secondary | ICD-10-CM | POA: Diagnosis not present

## 2019-11-08 DIAGNOSIS — J3081 Allergic rhinitis due to animal (cat) (dog) hair and dander: Secondary | ICD-10-CM | POA: Diagnosis not present

## 2019-11-08 DIAGNOSIS — J301 Allergic rhinitis due to pollen: Secondary | ICD-10-CM | POA: Diagnosis not present

## 2019-11-11 DIAGNOSIS — J301 Allergic rhinitis due to pollen: Secondary | ICD-10-CM | POA: Diagnosis not present

## 2019-11-11 DIAGNOSIS — J3089 Other allergic rhinitis: Secondary | ICD-10-CM | POA: Diagnosis not present

## 2019-11-12 NOTE — Telephone Encounter (Signed)
noted 

## 2019-11-12 NOTE — Telephone Encounter (Signed)
(  PSR) received Blank DMV Customer Medical Report on 11/12/19,form has been scanned into CC and placed in Provider's Bin to be completed.

## 2019-11-18 DIAGNOSIS — L7211 Pilar cyst: Secondary | ICD-10-CM | POA: Diagnosis not present

## 2019-11-19 DIAGNOSIS — J3089 Other allergic rhinitis: Secondary | ICD-10-CM | POA: Diagnosis not present

## 2019-11-19 DIAGNOSIS — J301 Allergic rhinitis due to pollen: Secondary | ICD-10-CM | POA: Diagnosis not present

## 2019-11-19 NOTE — Telephone Encounter (Signed)
Writer faxed completed [facility] Customer Medical Report to # [phone] on 11/19/19,confirmation received and scanned into CC.Pt has been notified and original has been placed in [facility] for pick-up.

## 2019-11-19 NOTE — Telephone Encounter (Signed)
DMV forms completed. Patient needs to attach blood sugar log. Ask if she wants forms faxed without them

## 2019-11-20 ENCOUNTER — Telehealth: Payer: Self-pay | Admitting: Obstetrics and Gynecology

## 2019-11-20 DIAGNOSIS — M810 Age-related osteoporosis without current pathological fracture: Secondary | ICD-10-CM

## 2019-11-20 DIAGNOSIS — D509 Iron deficiency anemia, unspecified: Secondary | ICD-10-CM | POA: Diagnosis not present

## 2019-11-20 DIAGNOSIS — M8580 Other specified disorders of bone density and structure, unspecified site: Secondary | ICD-10-CM

## 2019-11-20 NOTE — Telephone Encounter (Signed)
Spoke to pt. Pt updated with BMD orders placed. Pt aware and will call TBC to schedule BMD.   Routing to Dr Talbert Nan for review and will close encounter

## 2019-11-20 NOTE — Telephone Encounter (Signed)
Patient is requesting an order be sent to [facility] for her BMD. She asked for a call when this has been sent.

## 2019-11-22 DIAGNOSIS — J301 Allergic rhinitis due to pollen: Secondary | ICD-10-CM | POA: Diagnosis not present

## 2019-11-22 DIAGNOSIS — J3089 Other allergic rhinitis: Secondary | ICD-10-CM | POA: Diagnosis not present

## 2019-11-23 ENCOUNTER — Inpatient Hospital Stay: Admit: 2019-11-23 | Payer: Medicare (Managed Care) | Primary: Family

## 2019-11-23 ENCOUNTER — Inpatient Hospital Stay
Admit: 2019-11-23 | Discharge: 2019-12-18 | Disposition: A | Discharge status: Skilled Nursing Facility | Payer: Medicare (Managed Care) | Attending: Internal Medicine | Admitting: Internal Medicine

## 2019-11-23 ENCOUNTER — Emergency Department: Admit: 2019-11-23 | Payer: Medicare (Managed Care) | Primary: Family

## 2019-11-23 DIAGNOSIS — E1152 Type 2 diabetes mellitus with diabetic peripheral angiopathy with gangrene: Secondary | ICD-10-CM

## 2019-11-23 LAB — CBC WITH AUTOMATED DIFF
ABS. BASOPHILS: 0.1 10*3/uL (ref 0.0–0.1)
ABS. EOSINOPHILS: 0.3 10*3/uL (ref 0.0–0.4)
ABS. IMM. GRANS.: 0 10*3/uL
ABS. LYMPHOCYTES: 1.6 10*3/uL (ref 0.8–3.5)
ABS. MONOCYTES: 0.5 10*3/uL (ref 0.0–1.0)
ABS. NEUTROPHILS: 5.6 10*3/uL (ref 1.8–8.0)
ABSOLUTE NRBC: 0 10*3/uL (ref 0.00–0.01)
BASOPHILS: 1 % (ref 0–1)
EOSINOPHILS: 4 % (ref 0–7)
HCT: 34.3 % — ABNORMAL LOW (ref 35.0–47.0)
HGB: 11.7 g/dL (ref 11.5–16.0)
IMMATURE GRANULOCYTES: 0 %
LYMPHOCYTES: 20 % (ref 12–49)
MCH: 31 PG (ref 26.0–34.0)
MCHC: 34.1 g/dL (ref 30.0–36.5)
MCV: 90.7 FL (ref 80.0–99.0)
MONOCYTES: 6 % (ref 5–13)
MPV: 10 FL (ref 8.9–12.9)
NEUTROPHILS: 69 % (ref 32–75)
NRBC: 0 PER 100 WBC
PLATELET: 417 10*3/uL — ABNORMAL HIGH (ref 150–400)
RBC: 3.78 M/uL — ABNORMAL LOW (ref 3.80–5.20)
RDW: 12.9 % (ref 11.5–14.5)
WBC: 8.1 10*3/uL (ref 3.6–11.0)

## 2019-11-23 LAB — HEMOGLOBIN A1C WITH EAG
Est. average glucose: 186 mg/dL
Hemoglobin A1c: 8.1 % — ABNORMAL HIGH (ref 4.0–5.6)

## 2019-11-23 LAB — SED RATE (ESR): Sed rate, automated: 48 mm/hr — ABNORMAL HIGH (ref 0–30)

## 2019-11-23 LAB — SAMPLES BEING HELD

## 2019-11-23 LAB — C REACTIVE PROTEIN, QT: C-Reactive protein: 1.11 mg/dL — ABNORMAL HIGH (ref 0.00–0.60)

## 2019-11-23 LAB — CBC WITH AUTO DIFFERENTIAL
Basophils %: 1 % (ref 0–1)
Basophils Absolute: 0.1 10*3/uL (ref 0.0–0.1)
Eosinophils %: 4 % (ref 0–7)
Eosinophils Absolute: 0.3 10*3/uL (ref 0.0–0.4)
Granulocyte Absolute Count: 0 10*3/uL
Hematocrit: 34.3 % — ABNORMAL LOW (ref 35.0–47.0)
Hemoglobin: 11.7 g/dL (ref 11.5–16.0)
Immature Granulocytes %: 0 %
Lymphocytes %: 20 % (ref 12–49)
Lymphocytes Absolute: 1.6 10*3/uL (ref 0.8–3.5)
MCH: 31 PG (ref 26.0–34.0)
MCHC: 34.1 g/dL (ref 30.0–36.5)
MCV: 90.7 FL (ref 80.0–99.0)
MPV: 10 FL (ref 8.9–12.9)
Monocytes %: 6 % (ref 5–13)
Monocytes Absolute: 0.5 10*3/uL (ref 0.0–1.0)
NRBC Absolute: 0 10*3/uL (ref 0.00–0.01)
Neutrophils %: 69 % (ref 32–75)
Neutrophils Absolute: 5.6 10*3/uL (ref 1.8–8.0)
Nucleated RBCs: 0 PER 100 WBC
Platelets: 417 10*3/uL — ABNORMAL HIGH (ref 150–400)
RBC: 3.78 M/uL — ABNORMAL LOW (ref 3.80–5.20)
RDW: 12.9 % (ref 11.5–14.5)
WBC: 8.1 10*3/uL (ref 3.6–11.0)

## 2019-11-23 LAB — HEMOGLOBIN A1C W/EAG
Hemoglobin A1C: 8.1 % — ABNORMAL HIGH (ref 4.0–5.6)
eAG: 186 mg/dL

## 2019-11-23 LAB — C-REACTIVE PROTEIN: CRP: 1.11 mg/dL — ABNORMAL HIGH (ref 0.00–0.60)

## 2019-11-23 LAB — SEDIMENTATION RATE: Sed Rate, Automated: 48 mm/h — ABNORMAL HIGH (ref 0–30)

## 2019-11-23 MED ORDER — ENOXAPARIN 40 MG/0.4 ML SUB-Q SYRINGE
40 mg/0.4 mL | SUBCUTANEOUS | Status: DC
Start: 2019-11-23 — End: 2019-12-16
  Administered 2019-11-23 – 2019-12-15 (×17): via SUBCUTANEOUS

## 2019-11-23 MED ORDER — VANCOMYCIN IN 0.9 % SODIUM CHLORIDE 1.75 GRAM/500 ML IV
1.75 gram/500 mL | INTRAVENOUS | Status: AC
Start: 2019-11-23 — End: 2019-11-23
  Administered 2019-11-23: 20:00:00 via INTRAVENOUS

## 2019-11-23 MED ORDER — POLYETHYLENE GLYCOL 3350 17 GRAM (100 %) ORAL POWDER PACKET
17 gram | Freq: Every day | ORAL | Status: DC | PRN
Start: 2019-11-23 — End: 2019-11-28

## 2019-11-23 MED ORDER — ACETAMINOPHEN 325 MG TABLET
325 mg | Freq: Four times a day (QID) | ORAL | Status: DC | PRN
Start: 2019-11-23 — End: 2019-12-03
  Administered 2019-12-01 – 2019-12-02 (×3): via ORAL

## 2019-11-23 MED ORDER — IOPAMIDOL 61 % IV SOLN
300 mg iodine /mL (61 %) | Freq: Once | INTRAVENOUS | Status: AC
Start: 2019-11-23 — End: 2019-11-23
  Administered 2019-11-24: 02:00:00 via INTRAVENOUS

## 2019-11-23 MED ORDER — MORPHINE 2 MG/ML INJECTION
2 mg/mL | INTRAMUSCULAR | Status: AC
Start: 2019-11-23 — End: 2019-11-23
  Administered 2019-11-23: 19:00:00 via INTRAVENOUS

## 2019-11-23 MED ORDER — SODIUM CHLORIDE 0.9 % IJ SYRG
Freq: Three times a day (TID) | INTRAMUSCULAR | Status: DC
Start: 2019-11-23 — End: 2019-12-18
  Administered 2019-11-24 – 2019-12-18 (×65): via INTRAVENOUS

## 2019-11-23 MED ORDER — SODIUM CHLORIDE 0.9 % IV
INTRAVENOUS | Status: AC
Start: 2019-11-23 — End: 2019-11-24
  Administered 2019-11-23: via INTRAVENOUS

## 2019-11-23 MED ORDER — VANCOMYCIN 10 GRAM IV SOLR
10 gram | INTRAVENOUS | Status: DC
Start: 2019-11-23 — End: 2019-11-23
  Administered 2019-11-23: 19:00:00 via INTRAVENOUS

## 2019-11-23 MED ORDER — AMLODIPINE 5 MG TAB
5 mg | Freq: Every day | ORAL | Status: DC
Start: 2019-11-23 — End: 2019-12-10
  Administered 2019-11-24 – 2019-12-06 (×10): via ORAL

## 2019-11-23 MED ORDER — DEXTROSE 50% IN WATER (D50W) IV SYRG
INTRAVENOUS | Status: DC | PRN
Start: 2019-11-23 — End: 2019-12-18
  Administered 2019-11-25: 17:00:00 via INTRAVENOUS

## 2019-11-23 MED ORDER — PROMETHAZINE 25 MG TAB
25 mg | Freq: Four times a day (QID) | ORAL | Status: DC | PRN
Start: 2019-11-23 — End: 2019-12-18

## 2019-11-23 MED ORDER — INSULIN LISPRO 100 UNIT/ML INJECTION
100 unit/mL | Freq: Four times a day (QID) | SUBCUTANEOUS | Status: DC
Start: 2019-11-23 — End: 2019-12-18
  Administered 2019-11-24 – 2019-12-16 (×30): via SUBCUTANEOUS

## 2019-11-23 MED ORDER — PANTOPRAZOLE 40 MG TAB, DELAYED RELEASE
40 mg | Freq: Every day | ORAL | Status: DC
Start: 2019-11-23 — End: 2019-12-18
  Administered 2019-11-24 – 2019-12-18 (×30): via ORAL

## 2019-11-23 MED ORDER — GLUCAGON 1 MG INJECTION
1 mg | INTRAMUSCULAR | Status: DC | PRN
Start: 2019-11-23 — End: 2019-12-18

## 2019-11-23 MED ORDER — ENOXAPARIN 40 MG/0.4 ML SUB-Q SYRINGE
40 mg/0.4 mL | Freq: Every day | SUBCUTANEOUS | Status: DC
Start: 2019-11-23 — End: 2019-11-23

## 2019-11-23 MED ORDER — SODIUM CHLORIDE 0.9 % IV
750 mg | Freq: Two times a day (BID) | INTRAVENOUS | Status: DC
Start: 2019-11-23 — End: 2019-11-24
  Administered 2019-11-24: 09:00:00 via INTRAVENOUS

## 2019-11-23 MED ORDER — ASPIRIN 81 MG TAB, DELAYED RELEASE
81 mg | Freq: Every day | ORAL | Status: DC
Start: 2019-11-23 — End: 2019-12-18
  Administered 2019-11-24 – 2019-12-18 (×22): via ORAL

## 2019-11-23 MED ORDER — GLUCOSE 4 GRAM CHEWABLE TAB
4 gram | ORAL | Status: DC | PRN
Start: 2019-11-23 — End: 2019-12-18

## 2019-11-23 MED ORDER — PIPERACILLIN-TAZOBACTAM 3.375 GRAM IV SOLR
3.375 gram | INTRAVENOUS | Status: AC
Start: 2019-11-23 — End: 2019-11-23
  Administered 2019-11-23: 19:00:00 via INTRAVENOUS

## 2019-11-23 MED ORDER — SODIUM CHLORIDE 0.9 % IJ SYRG
INTRAMUSCULAR | Status: DC | PRN
Start: 2019-11-23 — End: 2019-12-18
  Administered 2019-12-04: 02:00:00 via INTRAVENOUS

## 2019-11-23 MED ORDER — POTASSIUM CHLORIDE SR 10 MEQ TAB
10 mEq | ORAL | Status: AC
Start: 2019-11-23 — End: 2019-11-23
  Administered 2019-11-23: 23:00:00 via ORAL

## 2019-11-23 MED ORDER — ONDANSETRON (PF) 4 MG/2 ML INJECTION
4 mg/2 mL | Freq: Four times a day (QID) | INTRAMUSCULAR | Status: DC | PRN
Start: 2019-11-23 — End: 2019-12-18
  Administered 2019-12-08: 10:00:00 via INTRAVENOUS

## 2019-11-23 MED ORDER — INSULIN GLARGINE 100 UNIT/ML INJECTION
100 unit/mL | Freq: Every evening | SUBCUTANEOUS | Status: DC
Start: 2019-11-23 — End: 2019-11-26
  Administered 2019-11-24 – 2019-11-27 (×4): via SUBCUTANEOUS

## 2019-11-23 MED ORDER — ACETAMINOPHEN 650 MG RECTAL SUPPOSITORY
650 mg | Freq: Four times a day (QID) | RECTAL | Status: DC | PRN
Start: 2019-11-23 — End: 2019-12-13

## 2019-11-23 MED FILL — VANCOMYCIN 10 GRAM IV SOLR: 10 gram | INTRAVENOUS | Qty: 1750

## 2019-11-23 MED FILL — PIPERACILLIN-TAZOBACTAM 3.375 GRAM IV SOLR: 3.375 gram | INTRAVENOUS | Qty: 3.38

## 2019-11-23 MED FILL — ISOVUE-300  61 % INTRAVENOUS SOLUTION: 300 mg iodine /mL (61 %) | INTRAVENOUS | Qty: 100

## 2019-11-23 MED FILL — MORPHINE 2 MG/ML INJECTION: 2 mg/mL | INTRAMUSCULAR | Qty: 1

## 2019-11-23 MED FILL — ENOXAPARIN 40 MG/0.4 ML SUB-Q SYRINGE: 40 mg/0.4 mL | SUBCUTANEOUS | Qty: 0.4

## 2019-11-23 MED FILL — SODIUM CHLORIDE 0.9 % IV: INTRAVENOUS | Qty: 1000

## 2019-11-23 MED FILL — K-TAB 10 MEQ TABLET,EXTENDED RELEASE: 10 mEq | ORAL | Qty: 4

## 2019-11-23 MED FILL — MONOJECT PREFILL ADVANCED 0.9 % SODIUM CHLORIDE INJECTION SYRINGE: INTRAMUSCULAR | Qty: 40

## 2019-11-23 MED FILL — INSULIN GLARGINE 100 UNIT/ML INJECTION: 100 unit/mL | SUBCUTANEOUS | Qty: 0.2

## 2019-11-23 NOTE — ED Notes (Signed)
Pt ambulatory with cane to xray, gait steady.

## 2019-11-23 NOTE — ED Notes (Signed)
Pt visitor dropped off fast food for pt to eat for dinner. Pt given water to drink and updated that she has a bed upstairs.

## 2019-11-23 NOTE — H&P (Signed)
H&P by Donato Schultz, MD at 11/23/19 1635                Author: Donato Schultz, MD  Service: Internal Medicine  Author Type: Physician       Filed: 11/23/19 1649  Date of Service: 11/23/19 1635  Status: Signed          Editor: Donato Schultz, MD (Physician)                               Hospitalist History and Physical   Donato Schultz, MD   Answering service: [phone] OR [phone] from in house phone            Date of Service:  11/23/2019   NAME:  Lindsey Huynh   DOB:  11/09/1950   MRN:  [id number]   Primary Care Provider: Karsten Fells, NP      Chief Complaint:      Chief Complaint       Patient presents with        ?  Toe Pain             History of Present Illness:        Lindsey Huynh is a 69 y.o. female with past medical history of diabetes, insulin-dependent, GERD and hypertension comes for return of left foot pain and swelling.   Patient is awake, alert and oriented able to answer my question follow my request, data also obtained in the ED staff and extensive chart review.  As per collective reports, patient left great  toe pain which associated darkening of the skin on the distal end of the toe and swelling of the left foot the medial aspect of the Of the last 1 week and has been progressively getting worse.  The only thing that he remembers was fungal  nail cream.  She reported since her left second toe is in contact with the first toe and that pain he also became slightly cellulitic, she also has some dorsal swelling of the foot as well.  This all progress from being mildly achy to significant tender  and swollen last 1 week and she came today because the pain was unbearable, 10/10, getting from her toe to her foot and made her ambulation significantly difficult.  Although she continues to have pain at rest.  She has not seek medical help for that  until today, did not remember any trauma or injury to her feet.  She is hemodynamically otherwise stable in the ED on her own.   Patient was hemodynamically stable in the ED, and work revealed hypokalemia leukocytosis and x-ray the foot was without any  acute abnormality.  Given concerns for dry gangrene and cellulitis, she was started on IV vancomycin.  Patient has no other medical complaints.  Patient will be admitted to hospitalist service for evaluation management.      Chart reviewed at length.   Review of Systems:   Pertinent positives noted in HPI. All other systems were reviewed and are negative.      Past Medical and surgical history:      Past Medical History:        Diagnosis  Date         ?  Diabetes ([place])       ?  GERD (gastroesophageal reflux disease)       ?  Hypercholesterolemia           ?  Hypertension             Past Surgical History:         Procedure  Laterality  Date          ?  HX COLONOSCOPY         ?  HX ORTHOPAEDIC    06-30-10          back surgery (tumor removed)          ?  HX TUMOR REMOVAL    06/30/10          under spinal cord           Home medications:     Prior to Admission medications             Medication  Sig  Start Date  End Date  Taking?  Authorizing Provider            amLODIPine (NORVASC) 10 mg tablet  Take 1 Tab by mouth daily.  10/15/19      Karsten Fells, NP     gabapentin (NEURONTIN) 300 mg capsule  Take 1 Cap by mouth nightly. Max Daily Amount: 300 mg.  10/15/19      Bobbie Stack T, NP     insulin detemir U-100 (Levemir FlexTouch U-100 Insuln) 100 unit/mL (3 mL) inpn  20 Units by SubCUTAneous route daily (with breakfast).  10/15/19      Karsten Fells, NP     metFORMIN ER (GLUCOPHAGE XR) 500 mg tablet  Take 2 Tabs by mouth daily (with dinner).  10/15/19      Karsten Fells, NP     valsartan-hydroCHLOROthiazide (DIOVAN-HCT) 160-25 mg per tablet  Take 1 Tab by mouth daily.  10/15/19      Bobbie Stack T, NP     Insulin Needles, Disposable, (Nano Pen Needle) 32 gauge x 5/32" ndle  Use with insulin pen 3 times daily  10/15/19      Bobbie Stack T, NP     cyclopentolate (CYCLOGYL) 1 % ophthalmic solution    02/19/18       Provider, Historical     aspirin delayed-release 81 mg tablet  Take 81 mg by mouth daily.        Provider, Historical            omeprazole (PRILOSEC) 20 mg capsule  Take 20 mg by mouth daily.        Provider, Historical           Allergies:     Allergies        Allergen  Reactions         ?  Neuromuscular Blockers, Steroidal  Other (comments)             GI Upset           Family history:    History reviewed. No pertinent family history.       SOCIAL HISTORY:   Patient resides at Home.   Patient ambulates with out device.    Smoking history: reports that she has quit smoking. She quit after 10.00 years of use. She has never used smokeless tobacco.   Drug History:  reports no history of drug use.   Alcohol history:  reports current alcohol use of about 5.0 standard drinks of alcohol per week.        Objective:           Physical Exam:    Visit Vitals  BP  (!) 143/78 (BP 1 Location: Left upper arm, BP Patient Position: At rest)     Pulse  81     Temp  97.6 ??F (36.4 ??C)     Resp  18     Ht  5\' 5"  (1.651 m)     Wt  81.6 kg (180 lb)     SpO2  99%        BMI  29.95 kg/m??        General appearance: alert, cooperative, no distress, appears stated age   Head: Normocephalic, without obvious abnormality, atraumatic   Eyes: negative findings: conjunctivae and sclerae normal   Throat: Lips, mucosa, and tongue normal. Teeth and gums normal   Lungs: clear to auscultation bilaterally   Heart: regular rate and rhythm, S1, S2 normal, no murmur, click, rub or gallop   Abdomen: soft, non-tender. Bowel sounds normal. No masses,  no organomegaly   Extremities: left great toe distal end has black area which is dry gangrenous appearing, has cellulitic changes to the 2nd toe's medial aspect   Pulses: 2+ and symmetric   Skin: Skin color, texture, turgor normal. No rashes or lesions   Neurologic: Grossly normal      Laboratory and other diagnostic Data Review:    All diagnostic labs and studies have been reviewed.      Xr Foot Lt  Min 3 V      Result Date: 11/23/2019   EXAM: XR FOOT LT MIN 3 V INDICATION: left foot pain. COMPARISON: None. FINDINGS: Three views of the left foot demonstrate no fracture or other acute osseous or articular abnormality. The soft tissues are within normal limits. Mild osteophytic changes  noted, most prominent at the first metatarsophalangeal joint.       No acute abnormality.         Patient Vitals for the past 12 hrs:            Temp  Pulse  Resp  BP  SpO2            11/23/19 1213  97.6 ??F (36.4 ??C)  81  18  (!) 143/78  99 %             Recent Labs           11/23/19   1332     WBC  8.1     HGB  11.7     HCT  34.3*        PLT  417*          Recent Labs           11/23/19   1332     NA  138     K  3.2*     CL  104     CO2  30     BUN  17     CREA  0.74     GLU  170*        CA  10.0          Recent Labs           11/23/19   1332     ALT  23     AP  150*     TBILI  0.2     TP  7.8     ALB  4.0        GLOB  3.8        No results for input(s): INR, PTP, APTT, INREXT in  the last 72 hours.    No results for input(s): FE, TIBC, PSAT, FERR in the last 72 hours.    No results found for: FOL, RBCF    No results for input(s): PH, PCO2, PO2 in the last 72 hours.   No results for input(s): CPK, CKNDX, TROIQ in the last 72 hours.      No lab exists for component: CPKMB     Lab Results         Component  Value  Date/Time            Cholesterol, total  219 (H)  10/30/2018 10:27 AM       HDL Cholesterol  63  10/30/2018 10:27 AM       LDL, calculated  130 (H)  10/30/2018 10:27 AM       Triglyceride  131  10/30/2018 10:27 AM            CHOL/HDL Ratio  3.4  12/26/2007 09:12 AM          Lab Results         Component  Value  Date/Time            Glucose POC  266 (A)  02/23/2018 12:54 PM            Glucose POC  352  12/11/2012 04:19 PM        No results found for: COLOR, APPRN, SPGRU, REFSG, PHU, PROTU, GLUCU, KETU, BILU, UROU, NITU, LEUKU, GLUKE, EPSU, BACTU, WBCU, RBCU, CASTS, UCRY        Assessment:     Given the patient's current  clinical presentation, I have a high level of concern for decompensation if discharged from the emergency department.  Complex decision making was performed, which includes  reviewing the patient's available past medical records, laboratory results, and x-ray films.          My assessment of this patient's clinical condition and my plan of care is as follows.         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 Pharmacy to dose Vanc.    Pain control   Vascular surgery consult      - IDDM2: hold metformin given IV dye for CT   Cont basal insulin, SSI   Diabetic diet   Get HbA1c      - HTN:   Controlled   Cont home meds as able      - GERD   Cont PPI      -Hypokalemia   Replete, repeat labs in am      Diet: Diabetic Diet   Activity: OOB with assistance   DVT prophylaxis: Lovenox   Isolation precautions: none   Consultations: Vascular surgery   Anticipated disposition: home   Code status: Full Code      Admit to inpatient status   Patient was explained about the risk of admission including  and not a complete list including risk of falls,fractures,blood clots,allergic reactions,infections. Patient/family also understands and agrees to the treatment plan including medications and  side effect profiles and also understand the risk with radiation while undergoing imaging studies.       The patient and the family/friends (after permission given by the patient to discuss) understand this and agree with the admission plan.             Signed By:  Donato Schultz, MD           11/23/19   4:35 PM            Patient's emergency contacts:   Extended  Emergency Contact Information   Primary Emergency Contact: [place] Phone: [phone]   Relation: Child       Please note that this dictation was completed with Dragon, the computer voice recognition software.  Quite often unanticipated grammatical, syntax, homophones, and other interpretive errors are inadvertently transcribed  by the computer software.  Please disregard these errors.  Please excuse any errors that have escaped final proofreading.

## 2019-11-23 NOTE — Op Note (Signed)
[place]  OPERATIVE REPORT    Name:  Lindsey Huynh, Lindsey Huynh  MR#:  [id number]  DOB:  03-24-1951  ACCOUNT #:  [id number]  DATE OF SERVICE:  11/25/2019      PREOPERATIVE DIAGNOSIS:  Peripheral vascular disease with rest pain and ulceration, left foot.    POSTOPERATIVE DIAGNOSIS:  Peripheral vascular disease with rest pain and ulceration, left foot.    PROCEDURE PERFORMED:  1.  Abdominal aortogram.  2.  Left leg runoff arteriogram with third order catheterization.    SURGEON:  Belenda Cruise, MD    ASSISTANT:  Jenny Reichmann.    ANESTHESIA:  Local with sedation.    COMPLICATIONS:  None.    SPECIMENS REMOVED:  None.    IMPLANTS:  Angio-Seal right groin.    ESTIMATED BLOOD LOSS:  Minimal.    INDICATIONS:  The patient is a 69 year old diabetic female who was admitted with left foot pain, absent pulses, and an ulceration on her left great toe.  She will undergo angiographic evaluation.    PROCEDURE:  The patient's right groin was prepped and draped.  The right common femoral artery was percutaneously cannulated and a 5-French sheath inserted over a guidewire.  A Glidewire and RIM catheter were used and the catheter advanced into the abdominal aorta.  An abdominal aortogram was done that showed no significant aortoiliac stenosis.  Using the  RIM catheter and a Glidewire, the aortic bifurcation was crossed and the left external iliac artery selectively catheterized.  A left leg arteriogram was performed to the level of the knee.  The 5-French sheath was exchanged for a long 6-French sheath which was positioned in the distal left external iliac artery.  The left superficial femoral artery was selectively catheterized.  The catheter and sheath were advanced into the superficial femoral artery.  Further runoff views from the knee and foot were obtained through a catheter and the superficial femoral artery.  There was occlusion of the distal superficial femoral artery and popliteal artery.  Attempts were made to  cross the occlusion using a catheter and Glidewire, but were unsuccessful.  The sheath was then pulled back into the right iliac artery in a right femoral arteriogram was performed.  The sheath was then removed over a guidewire and the femoral puncture site closed using an Angio-Seal device.  Dressings were applied.  The patient was transferred to the recovery area in stable condition.    FINDINGS:  1.  Aortoiliac arteriogram:  The abdominal aorta and iliac arteries are patent without narrowing.  2.  Left leg runoff arteriogram:  The common femoral, profunda femoris, and superficial femoral arteries are patent.  There is no significant stenosis in the superficial femoral artery.  The SFA is occluded at its distal portion at the junction with the popliteal artery.  The tibioperoneal trunk reconstitutes with two-vessel posterior tibial and peroneal outflow into the lower leg and foot.  The posterior tibial is a small diffusely narrow artery.  The peroneal artery is the best runoff vessel.    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  The patient will need bypass surgery for limb salvage.        Belenda Cruise, MD      [id number]  D:  11/25/2019 15:04  T:  11/25/2019 17:27  JOB #:  [id number]

## 2019-11-23 NOTE — ED Provider Notes (Signed)
Patient is a 69 year old female prior history of diabetes, GERD, hyperlipidemia and hypertension presenting today secondary left toe pain.  She reports 1 week of symptoms.  She has noticed swelling, redness, a wound and severe pain.  Over-the-counter pain medications not helping.  It is worse with walking.  She has not had fever.  She has not talked to her doctor about this.  She was using topical antifungal cream without relief.           Past Medical History:   Diagnosis Date   ??? Diabetes ([place])    ??? GERD (gastroesophageal reflux disease)    ??? Hypercholesterolemia    ??? Hypertension        Past Surgical History:   Procedure Laterality Date   ??? HX COLONOSCOPY     ??? HX ORTHOPAEDIC  06-30-10    back surgery (tumor removed)   ??? 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 No pertinent family history.    Social History     Socioeconomic History   ??? Marital status: SINGLE     Spouse name: Not on file   ??? Number of children: Not on file   ??? Years of education: Not on file   ??? Highest education level: Not on file   Occupational History   ??? Not on file   Social Needs   ??? Financial resource strain: Not on file   ??? Food insecurity     Worry: Not on file     Inability: Not on file   ??? Transportation needs     Medical: Not on file     Non-medical: Not on file   Tobacco Use   ??? Smoking status: Former Smoker     Years: 10.00   ??? Smokeless tobacco: Never Used   Substance and Sexual Activity   ??? Alcohol use: Yes     Alcohol/week: 5.0 standard drinks     Types: 6 Cans of beer per week   ??? Drug use: No   ??? Sexual activity: Not Currently   Lifestyle   ??? Physical activity     Days per week: Not on file     Minutes per session: Not on file   ??? Stress: Not on file   Relationships   ??? Social Product manager on phone: Not on file     Gets together: Not on file     Attends religious service: Not on file     Active member of club or organization: Not on file     Attends meetings of clubs or organizations:  Not on file     Relationship status: Not on file   ??? Intimate partner violence     Fear of current or ex partner: Not on file     Emotionally abused: Not on file     Physically abused: Not on file     Forced sexual activity: Not on file   Other Topics Concern   ??? Not on file   Social History Narrative   ??? Not on file         ALLERGIES: Neuromuscular blockers, steroidal    Review of Systems   Constitutional: Negative for chills and fever.   HENT: Negative for congestion and rhinorrhea.    Eyes: Negative for redness and visual disturbance.   Respiratory: Negative for cough and shortness of breath.    Cardiovascular: Negative  for chest pain and leg swelling.   Gastrointestinal: Negative for abdominal pain, diarrhea, nausea and vomiting.   Genitourinary: Negative for dysuria, flank pain, frequency, hematuria and urgency.   Musculoskeletal: Positive for arthralgias. Negative for back pain, myalgias and neck pain.   Skin: Positive for wound. Negative for rash.   Allergic/Immunologic: Negative for immunocompromised state.   Neurological: Negative for dizziness and headaches.       Vitals:    11/23/19 1213   BP: (!) 143/78   Pulse: 81   Resp: 18   Temp: 97.6 ??F (36.4 ??C)   SpO2: 99%   Weight: 81.6 kg (180 lb)   Height: 5' 5"  (1.651 m)            Physical Exam  Vitals signs and nursing note reviewed.   Constitutional:       General: She is not in acute distress.     Appearance: She is well-developed. She is not diaphoretic.   HENT:      Head: Normocephalic.      Mouth/Throat:      Pharynx: No oropharyngeal exudate.   Eyes:      General:         Right eye: No discharge.         Left eye: No discharge.      Pupils: Pupils are equal, round, and reactive to light.   Neck:      Musculoskeletal: Normal range of motion and neck supple.   Cardiovascular:      Rate and Rhythm: Normal rate and regular rhythm.      Heart sounds: Normal heart sounds. No murmur. No friction rub. No gallop.    Pulmonary:      Effort: Pulmonary effort is  normal. No respiratory distress.      Breath sounds: Normal breath sounds. No stridor. No wheezing or rales.   Abdominal:      General: Bowel sounds are normal. There is no distension.      Palpations: Abdomen is soft.      Tenderness: There is no abdominal tenderness. There is no guarding or rebound.   Musculoskeletal: Normal range of motion.         General: No deformity.      Comments: Left lower extremity: There is swelling of the left lower extremity with erythema to the dorsum of the foot and ankle consistent with cellulitis.  There are few shallow ulcerations to the anterior shin.  The toe is markedly tender with swelling, erythema and an area of what appears to be dry gangrene to the distal tip of the toe.  No wet gangrene.  Palpable DP PT pulses in bilateral lower extremities.   Skin:     General: Skin is warm and dry.      Capillary Refill: Capillary refill takes less than 2 seconds.      Findings: No rash.   Neurological:      Mental Status: She is alert and oriented to person, place, and time.   Psychiatric:         Behavior: Behavior normal.          Labs Reviewed:   No leukocytosis  ESR/CRP elevated  Mild hypokalemia      Imaging Reviewed:   X-ray of the foot negative      Course:  Morphine given for pain  Broad-spectrum antibiotics given for cellulitis and gangrene    Perfect Serve Consult for Admission  3:41 PM    ED Room Number:  ER27/27  Patient Name and age:  Lindsey Huynh 68 y.o.  female  Working Diagnosis:   1. Dry gangrene ([place])    2. Cellulitis of left lower extremity    3. Hypokalemia        COVID-19 Suspicion:  no  Sepsis present:  no  Reassessment needed: no  Code Status:  Full Code  Readmission: no  Isolation Requirements:  no  Recommended Level of Care:  med/surg  Department:[facility] Adult ED - [phone]  Other:  69 y.o. diabetic female here with dry gangrene/cellulitis of L foot/great toe. Inflammatory markers up, no wbc elevated, doesn't appear septic. Probably needs MRI/bone scan to  r/o osteo.       MDM: Patient is a 68 year old diabetic female here today with what appears to be cellulitis of the left great toe with associated dry gangrene and involvement of the foot and ankle.  She is afebrile with stable vital signs and does not appear septic however inflammatory markers elevated.  X-ray is okay however I am concerned she may have early osteomyelitis and will require MRI/bone scan.  Patient to be admitted for further management and work-up.            Clinical Impression:     ICD-10-CM ICD-9-CM    1. Dry gangrene ([facility])  I96 785.4    2. Cellulitis of left lower extremity  L03.116 682.6    3. Hypokalemia  E87.6 276.8            Disposition: Admit  Ean Gettel E Khala Tarte, DO

## 2019-11-23 NOTE — Op Note (Signed)
[facility]  OPERATIVE REPORT    Name:  Lindsey Huynh, Lindsey Huynh  MR#:  [id number]  DOB:  28-Dec-1950  ACCOUNT #:  [id number]  DATE OF SERVICE:  11/28/2019      PREOPERATIVE DIAGNOSIS:  Peripheral vascular disease with ulceration, left great toe.    POSTOPERATIVE DIAGNOSIS:  Peripheral vascular disease with ulceration, left great toe.    PROCEDURE PERFORMED:  Left mid superficial femoral artery to proximal peroneal artery bypass using nonreversed saphenous vein.    SURGEON:  Belenda Cruise, MD    ASSISTANT:  Jenny Reichmann.    ANESTHESIA:  General.    COMPLICATIONS:  None.    SPECIMENS REMOVED:  None.    IMPLANTS:  None.    ESTIMATED BLOOD LOSS:  100 mL.    INDICATIONS:  The patient is a 69 year old female with diabetes and significant peripheral vascular disease who was admitted with left foot pain and nonhealing ulceration on the left great toe.  Angiography shows an occluded popliteal artery with reconstitution of the tibioperoneal trunk.  Her ankle-brachial index is 0.3.  She will undergo bypass for limb salvage.    PROCEDURE:  The patient's left leg was prepped and draped.  A transverse incision was made in the left inguinal crease.  The saphenous vein was identified, dissected free, and ligated and divided at the saphenofemoral junction.  The vein was then dissected distally.  Two interrupted longitudinal incisions were made, one in the mid thigh and one in the distal medial thigh.  The vein was further dissected free down to the knee.  The superficial femoral artery was dissected free through the mid thigh incision.  A longitudinal incision was made in the proximal medial lower leg.  The incision was deepened into the popliteal space.  The soleus muscle was divided identifying the tibioperoneal trunk and proximal posterior tibial and peroneal arteries.  The patient was heparinized.  The vein graft was sewn end-to-side to the superficial femoral artery using running 5-0 Prolene.  The vein was oriented in  a nonreversed fashion.  The valves within the vein graft were then cut using a valvulotome establishing good flow through the bypass.  The vein graft was then tunneled subfascially from the mid thigh to the distal thigh and then through the popliteal space.  It was then sewn end-to-side to the distal portion of the tibioperoneal trunk onto the peroneal artery.  This was done using running 6-0 Prolene.  Following this, there was a good pulse in the bypass and good flow by Doppler.  The incisions were closed with Vicryl subcutaneous suture and skin staples.  Dressings were applied and the patient was returned to the recovery room in stable condition.        Belenda Cruise, MD      [id number]  D:  11/28/2019 14:34  T:  11/28/2019 18:35  JOB #:  [id number]

## 2019-11-23 NOTE — Progress Notes (Signed)
 1836:  TRANSFER - IN REPORT:  Verbal report received from [place], Charity fundraiser (name) on Lindsey Huynh  being received from ED (unit) for routine progression of care    Report consisted of patient's Situation, Background, Assessment and   Recommendations(SBAR).   Information from the following report(s) SBAR, Kardex, ED Summary, Intake/Output, MAR and Recent Results was reviewed with the receiving nurse.  Opportunity for questions and clarification was provided.    Assessment completed upon patient's arrival to unit and care assumed.     1909:  RN notified Phil, NP that patient is verbalizing complaints of significant pain and that there are only orders for PRN tylenol .  Per NP he placed order for pain medication.  Will continue to monitor.    2012:  Bedside shift change report given to Zach, RN (oncoming nurse) by Ellouise, RN (offgoing nurse). Report included the following information SBAR, Kardex, Intake/Output, MAR and Recent Results.

## 2019-11-23 NOTE — ED Notes (Signed)
 Pt ambulatory with cane to ED with c/o left great toe pain onset 7 days ago after using Fungi-Nail cream. The whole nail came off of my big toe and it's painful.

## 2019-11-23 NOTE — ED Notes (Signed)
Pt ambulatory with cane to ED with c/o left great toe pain onset 7 days ago after using Fungi-Nail cream. "The whole nail came off of my big toe and it's painful".

## 2019-11-23 NOTE — Progress Notes (Addendum)
1836:  TRANSFER - IN REPORT:  Verbal report received from [place], Therapist, sports (name) on Rohini Casasanta  being received from ED (unit) for routine progression of care    Report consisted of patient???s Situation, Background, Assessment and   Recommendations(SBAR).   Information from the following report(s) SBAR, Kardex, ED Summary, Intake/Output, MAR and Recent Results was reviewed with the receiving nurse.  Opportunity for questions and clarification was provided.    Assessment completed upon patient???s arrival to unit and care assumed.     1909:  RN notified Phil, NP that patient is verbalizing complaints of significant pain and that there are only orders for PRN tylenol.  Per NP he placed order for pain medication.  Will continue to monitor.    2012:  Bedside shift change report given to Thedore Mins, RN (oncoming nurse) by Otila Kluver, RN (offgoing nurse). Report included the following information SBAR, Kardex, Intake/Output, MAR and Recent Results.

## 2019-11-23 NOTE — ED Provider Notes (Signed)
Patient is a 69 year old female prior history of diabetes, GERD, hyperlipidemia and hypertension presenting today secondary left toe pain.  She reports 1 week of symptoms.  She has noticed swelling, redness, a wound and severe pain.  Over-the-counter pain medications not helping.  It is worse with walking.  She has not had fever.  She has not talked to her doctor about this.  She was using topical antifungal cream without relief.           Past Medical History:   Diagnosis Date   ??? Diabetes ([place])    ??? GERD (gastroesophageal reflux disease)    ??? Hypercholesterolemia    ??? Hypertension        Past Surgical History:   Procedure Laterality Date   ??? HX COLONOSCOPY     ??? HX ORTHOPAEDIC  06-30-10    back surgery (tumor removed)   ??? 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 No pertinent family history.    Social History     Socioeconomic History   ??? Marital status: SINGLE     Spouse name: Not on file   ??? Number of children: Not on file   ??? Years of education: Not on file   ??? Highest education level: Not on file   Occupational History   ??? Not on file   Social Needs   ??? Financial resource strain: Not on file   ??? Food insecurity     Worry: Not on file     Inability: Not on file   ??? Transportation needs     Medical: Not on file     Non-medical: Not on file   Tobacco Use   ??? Smoking status: Former Smoker     Years: 10.00   ??? Smokeless tobacco: Never Used   Substance and Sexual Activity   ??? Alcohol use: Yes     Alcohol/week: 5.0 standard drinks     Types: 6 Cans of beer per week   ??? Drug use: No   ??? Sexual activity: Not Currently   Lifestyle   ??? Physical activity     Days per week: Not on file     Minutes per session: Not on file   ??? Stress: Not on file   Relationships   ??? Social Product manager on phone: Not on file     Gets together: Not on file     Attends religious service: Not on file     Active member of club or organization: Not on file     Attends meetings of clubs or organizations:  Not on file     Relationship status: Not on file   ??? Intimate partner violence     Fear of current or ex partner: Not on file     Emotionally abused: Not on file     Physically abused: Not on file     Forced sexual activity: Not on file   Other Topics Concern   ??? Not on file   Social History Narrative   ??? Not on file         ALLERGIES: Neuromuscular blockers, steroidal    Review of Systems   Constitutional: Negative for chills and fever.   HENT: Negative for congestion and rhinorrhea.    Eyes: Negative for redness and visual disturbance.   Respiratory: Negative for cough and shortness of breath.    Cardiovascular: Negative  for chest pain and leg swelling.   Gastrointestinal: Negative for abdominal pain, diarrhea, nausea and vomiting.   Genitourinary: Negative for dysuria, flank pain, frequency, hematuria and urgency.   Musculoskeletal: Positive for arthralgias. Negative for back pain, myalgias and neck pain.   Skin: Positive for wound. Negative for rash.   Allergic/Immunologic: Negative for immunocompromised state.   Neurological: Negative for dizziness and headaches.       Vitals:    11/23/19 1213   BP: (!) 143/78   Pulse: 81   Resp: 18   Temp: 97.6 ??F (36.4 ??C)   SpO2: 99%   Weight: 81.6 kg (180 lb)   Height: 5' 5"  (1.651 m)            Physical Exam  Vitals signs and nursing note reviewed.   Constitutional:       General: She is not in acute distress.     Appearance: She is well-developed. She is not diaphoretic.   HENT:      Head: Normocephalic.      Mouth/Throat:      Pharynx: No oropharyngeal exudate.   Eyes:      General:         Right eye: No discharge.         Left eye: No discharge.      Pupils: Pupils are equal, round, and reactive to light.   Neck:      Musculoskeletal: Normal range of motion and neck supple.   Cardiovascular:      Rate and Rhythm: Normal rate and regular rhythm.      Heart sounds: Normal heart sounds. No murmur. No friction rub. No gallop.    Pulmonary:      Effort: Pulmonary effort is  normal. No respiratory distress.      Breath sounds: Normal breath sounds. No stridor. No wheezing or rales.   Abdominal:      General: Bowel sounds are normal. There is no distension.      Palpations: Abdomen is soft.      Tenderness: There is no abdominal tenderness. There is no guarding or rebound.   Musculoskeletal: Normal range of motion.         General: No deformity.      Comments: Left lower extremity: There is swelling of the left lower extremity with erythema to the dorsum of the foot and ankle consistent with cellulitis.  There are few shallow ulcerations to the anterior shin.  The toe is markedly tender with swelling, erythema and an area of what appears to be dry gangrene to the distal tip of the toe.  No wet gangrene.  Palpable DP PT pulses in bilateral lower extremities.   Skin:     General: Skin is warm and dry.      Capillary Refill: Capillary refill takes less than 2 seconds.      Findings: No rash.   Neurological:      Mental Status: She is alert and oriented to person, place, and time.   Psychiatric:         Behavior: Behavior normal.          Labs Reviewed:   No leukocytosis  ESR/CRP elevated  Mild hypokalemia      Imaging Reviewed:   X-ray of the foot negative      Course:  Morphine given for pain  Broad-spectrum antibiotics given for cellulitis and gangrene    Perfect Serve Consult for Admission  3:41 PM    ED Room Number:  ER27/27  Patient Name and age:  Lindsey Huynh 69 y.o.  female  Working Diagnosis:   1. Dry gangrene ([place])    2. Cellulitis of left lower extremity    3. Hypokalemia        COVID-19 Suspicion:  no  Sepsis present:  no  Reassessment needed: no  Code Status:  Full Code  Readmission: no  Isolation Requirements:  no  Recommended Level of Care:  med/surg  Department:[facility] ED - [phone]  Other:  69 y.o. diabetic female here with dry gangrene/cellulitis of L foot/great toe. Inflammatory markers up, no wbc elevated, doesn't appear septic. Probably needs MRI/bone scan to  r/o osteo.       MDM: Patient is a 69 year old diabetic female here today with what appears to be cellulitis of the left great toe with associated dry gangrene and involvement of the foot and ankle.  She is afebrile with stable vital signs and does not appear septic however inflammatory markers elevated.  X-ray is okay however I am concerned she may have early osteomyelitis and will require MRI/bone scan.  Patient to be admitted for further management and work-up.            Clinical Impression:     ICD-10-CM ICD-9-CM    1. Dry gangrene ([facility])  I96 785.4    2. Cellulitis of left lower extremity  L03.116 682.6    3. Hypokalemia  E87.6 276.8            Disposition: Admit  Persephanie Laatsch E Margaurite Salido, DO

## 2019-11-23 NOTE — H&P (Signed)
Hospitalist History and Physical  Lindsey Schultz, MD  Answering service: [phone] OR [phone] from in house phone        Date of Service:  11/23/2019  NAME:  Lindsey Huynh  DOB:  1951/08/30  MRN:  [id number]  Primary Care Provider: Karsten Fells, NP    Chief Complaint:   Chief Complaint   Patient presents with   ??? Toe Pain       History of Present Illness:     Lindsey Huynh is a 69 y.o. female with past medical history of diabetes, insulin-dependent, GERD and hypertension comes for return of left foot pain and swelling.  Patient is awake, alert and oriented able to answer my question follow my request, data also obtained in the ED staff and extensive chart review.  As per collective reports, patient left great toe pain which associated darkening of the skin on the distal end of the toe and swelling of the left foot the medial aspect of the Of the last 1 week and has been progressively getting worse.  The only thing that he remembers was fungal nail cream.  She reported since her left second toe is in contact with the first toe and that pain he also became slightly cellulitic, she also has some dorsal swelling of the foot as well.  This all progress from being mildly achy to significant tender and swollen last 1 week and she came today because the pain was unbearable, 10/10, getting from her toe to her foot and made her ambulation significantly difficult.  Although she continues to have pain at rest.  She has not seek medical help for that until today, did not remember any trauma or injury to her feet.  She is hemodynamically otherwise stable in the ED on her own.  Patient was hemodynamically stable in the ED, and work revealed hypokalemia leukocytosis and x-ray the foot was without any acute abnormality.  Given concerns for dry gangrene and cellulitis, she was started on IV vancomycin.  Patient has no other medical complaints.  Patient will be admitted to hospitalist service for evaluation management.     Chart reviewed at length.  Review of Systems:  Pertinent positives noted in HPI. All other systems were reviewed and are negative.    Past Medical and surgical history:   Past Medical History:   Diagnosis Date   ??? Diabetes ([place])    ??? GERD (gastroesophageal reflux disease)    ??? Hypercholesterolemia    ??? Hypertension       Past Surgical History:   Procedure Laterality Date   ??? HX COLONOSCOPY     ??? HX ORTHOPAEDIC  06-30-10    back surgery (tumor removed)   ??? 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 Authorizing Provider   amLODIPine (NORVASC) 10 mg tablet Take 1 Tab by mouth daily. 10/15/19   Karsten Fells, NP   gabapentin (NEURONTIN) 300 mg capsule Take 1 Cap by mouth nightly. Max Daily Amount: 300 mg. 10/15/19   Bobbie Stack T, NP   insulin detemir U-100 (Levemir FlexTouch U-100 Insuln) 100 unit/mL (3 mL) inpn 20 Units by SubCUTAneous route daily (with breakfast). 10/15/19   Karsten Fells, NP   metFORMIN ER (GLUCOPHAGE XR) 500 mg tablet Take 2 Tabs by mouth daily (with dinner). 10/15/19   Karsten Fells, NP  valsartan-hydroCHLOROthiazide (DIOVAN-HCT) 160-25 mg per tablet Take 1 Tab by mouth daily. 10/15/19   Bobbie Stack T, NP   Insulin Needles, Disposable, (Nano Pen Needle) 32 gauge x 5/32" ndle Use with insulin pen 3 times daily 10/15/19   Bobbie Stack T, NP   cyclopentolate (CYCLOGYL) 1 % ophthalmic solution  02/19/18   Provider, Historical   aspirin delayed-release 81 mg tablet Take 81 mg by mouth daily.    Provider, Historical   omeprazole (PRILOSEC) 20 mg capsule Take 20 mg by mouth daily.    Provider, Historical       Allergies:  Allergies   Allergen Reactions   ??? Neuromuscular Blockers, Steroidal Other (comments)     GI Upset       Family history:   History reviewed. No pertinent family history.     SOCIAL HISTORY:  Patient resides at Home.  Patient ambulates with out device.   Smoking history: reports that she  has quit smoking. She quit after 10.00 years of use. She has never used smokeless tobacco.  Drug History:  reports no history of drug use.  Alcohol history:  reports current alcohol use of about 5.0 standard drinks of alcohol per week.    Objective:       Physical Exam:   Visit Vitals  BP (!) 143/78 (BP 1 Location: Left upper arm, BP Patient Position: At rest)   Pulse 81   Temp 97.6 ??F (36.4 ??C)   Resp 18   Ht 5\' 5"  (1.651 m)   Wt 81.6 kg (180 lb)   SpO2 99%   BMI 29.95 kg/m??     General appearance: alert, cooperative, no distress, appears stated age  Head: Normocephalic, without obvious abnormality, atraumatic  Eyes: negative findings: conjunctivae and sclerae normal  Throat: Lips, mucosa, and tongue normal. Teeth and gums normal  Lungs: clear to auscultation bilaterally  Heart: regular rate and rhythm, S1, S2 normal, no murmur, click, rub or gallop  Abdomen: soft, non-tender. Bowel sounds normal. No masses,  no organomegaly  Extremities: left great toe distal end has black area which is dry gangrenous appearing, has cellulitic changes to the 2nd toe's medial aspect  Pulses: 2+ and symmetric  Skin: Skin color, texture, turgor normal. No rashes or lesions  Neurologic: Grossly normal    Laboratory and other diagnostic Data Review:   All diagnostic labs and studies have been reviewed.    Xr Foot Lt Min 3 V    Result Date: 11/23/2019  EXAM: XR FOOT LT MIN 3 V INDICATION: left foot pain. COMPARISON: None. FINDINGS: Three views of the left foot demonstrate no fracture or other acute osseous or articular abnormality. The soft tissues are within normal limits. Mild osteophytic changes noted, most prominent at the first metatarsophalangeal joint.     No acute abnormality.      Patient Vitals for the past 12 hrs:   Temp Pulse Resp BP SpO2   11/23/19 1213 97.6 ??F (36.4 ??C) 81 18 (!) 143/78 99 %       Recent Labs     11/23/19  1332   WBC 8.1   HGB 11.7   HCT 34.3*   PLT 417*     Recent Labs     11/23/19  1332   NA 138   K 3.2*    CL 104   CO2 30   BUN 17   CREA 0.74   GLU 170*   CA 10.0     Recent Labs  11/23/19  1332   ALT 23   AP 150*   TBILI 0.2   TP 7.8   ALB 4.0   GLOB 3.8     No results for input(s): INR, PTP, APTT, INREXT in the last 72 hours.   No results for input(s): FE, TIBC, PSAT, FERR in the last 72 hours.   No results found for: FOL, RBCF   No results for input(s): PH, PCO2, PO2 in the last 72 hours.  No results for input(s): CPK, CKNDX, TROIQ in the last 72 hours.    No lab exists for component: CPKMB  Lab Results   Component Value Date/Time    Cholesterol, total 219 (H) 10/30/2018 10:27 AM    HDL Cholesterol 63 10/30/2018 10:27 AM    LDL, calculated 130 (H) 10/30/2018 10:27 AM    Triglyceride 131 10/30/2018 10:27 AM    CHOL/HDL Ratio 3.4 12/26/2007 09:12 AM     Lab Results   Component Value Date/Time    Glucose POC 266 (A) 02/23/2018 12:54 PM    Glucose POC 352 12/11/2012 04:19 PM     No results found for: COLOR, APPRN, SPGRU, REFSG, PHU, PROTU, GLUCU, KETU, BILU, UROU, NITU, LEUKU, GLUKE, EPSU, BACTU, WBCU, RBCU, CASTS, UCRY    Assessment:   Given the patient's current clinical presentation, I have a high level of concern for decompensation if discharged from the emergency department.  Complex decision making was performed, which includes reviewing the patient's available past medical records, laboratory results, and x-ray films.        My assessment of this patient's clinical condition and my plan of care is as follows.      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 Pharmacy to dose Vanc.   Pain control  Vascular surgery consult    - IDDM2: hold metformin  given IV dye for CT  Cont basal insulin, SSI  Diabetic diet  Get HbA1c    - HTN:  Controlled  Cont home meds as able    - GERD  Cont PPI    -Hypokalemia  Replete, repeat labs in am    Diet: Diabetic Diet  Activity: OOB with assistance  DVT prophylaxis: Lovenox  Isolation precautions: none  Consultations: Vascular surgery  Anticipated disposition: home  Code status: Full Code    Admit to inpatient status  Patient was explained about the risk of admission including and not a complete list including risk of falls,fractures,blood clots,allergic reactions,infections. Patient/family also understands and agrees to the treatment plan including medications and side effect profiles and also understand the risk with radiation while undergoing imaging studies.     The patient and the family/friends (after permission given by the patient to discuss) understand this and agree with the admission plan.       Signed By: Lindsey Schultz, MD     11/23/19  4:35 PM        Patient's emergency contacts:  Extended Emergency Contact Information  Primary Emergency Contact: [place]  [place] Phone: [phone]  Relation: Child     Please note that this dictation was completed with Dragon, the computer voice recognition software.  Quite often unanticipated grammatical, syntax, homophones, and other interpretive errors are inadvertently transcribed by the computer software.  Please disregard these errors.  Please excuse any errors that have escaped final proofreading.

## 2019-11-24 LAB — CBC WITH AUTOMATED DIFF
ABS. BASOPHILS: 0 10*3/uL (ref 0.0–0.1)
ABS. EOSINOPHILS: 0.3 10*3/uL (ref 0.0–0.4)
ABS. IMM. GRANS.: 0 10*3/uL (ref 0.00–0.04)
ABS. LYMPHOCYTES: 1.6 10*3/uL (ref 0.8–3.5)
ABS. MONOCYTES: 0.7 10*3/uL (ref 0.0–1.0)
ABS. NEUTROPHILS: 5.7 10*3/uL (ref 1.8–8.0)
ABSOLUTE NRBC: 0 10*3/uL (ref 0.00–0.01)
BASOPHILS: 1 % (ref 0–1)
EOSINOPHILS: 3 % (ref 0–7)
HCT: 33.2 % — ABNORMAL LOW (ref 35.0–47.0)
HGB: 11.6 g/dL (ref 11.5–16.0)
IMMATURE GRANULOCYTES: 0 % (ref 0.0–0.5)
LYMPHOCYTES: 20 % (ref 12–49)
MCH: 31.6 PG (ref 26.0–34.0)
MCHC: 34.9 g/dL (ref 30.0–36.5)
MCV: 90.5 FL (ref 80.0–99.0)
MONOCYTES: 8 % (ref 5–13)
MPV: 10.6 FL (ref 8.9–12.9)
NEUTROPHILS: 68 % (ref 32–75)
NRBC: 0 PER 100 WBC
PLATELET: 406 10*3/uL — ABNORMAL HIGH (ref 150–400)
RBC: 3.67 M/uL — ABNORMAL LOW (ref 3.80–5.20)
RDW: 12.5 % (ref 11.5–14.5)
WBC: 8.3 10*3/uL (ref 3.6–11.0)

## 2019-11-24 LAB — METABOLIC PANEL, BASIC
Anion gap: 7 mmol/L (ref 5–15)
BUN/Creatinine ratio: 16 (ref 12–20)
BUN: 11 MG/DL (ref 6–20)
CO2: 27 mmol/L (ref 21–32)
Calcium: 9.8 MG/DL (ref 8.5–10.1)
Chloride: 103 mmol/L (ref 97–108)
Creatinine: 0.69 MG/DL (ref 0.55–1.02)
GFR est AA: >60 mL/min/{1.73_m2} (ref >60)
GFR est non-AA: >60 mL/min/{1.73_m2} (ref >60)
Glucose: 95 mg/dL (ref 65–100)
Potassium: 3.1 mmol/L — ABNORMAL LOW (ref 3.5–5.1)
Sodium: 137 mmol/L (ref 136–145)

## 2019-11-24 LAB — GLUCOSE, POC
Glucose (POC): 207 mg/dL — ABNORMAL HIGH (ref 65–100)
Glucose (POC): 139 mg/dL — ABNORMAL HIGH (ref 65–100)
Glucose (POC): 146 mg/dL — ABNORMAL HIGH (ref 65–100)
Glucose (POC): 166 mg/dL — ABNORMAL HIGH (ref 65–100)

## 2019-11-24 LAB — BASIC METABOLIC PANEL
Anion Gap: 7 mmol/L (ref 5–15)
BUN: 11 MG/DL (ref 6–20)
Bun/Cre Ratio: 16 (ref 12–20)
CO2: 27 mmol/L (ref 21–32)
Calcium: 9.8 MG/DL (ref 8.5–10.1)
Chloride: 103 mmol/L (ref 97–108)
Creatinine: 0.69 MG/DL (ref 0.55–1.02)
EGFR IF NonAfrican American: >60 mL/min/{1.73_m2} (ref >60)
GFR African American: >60 mL/min/{1.73_m2} (ref >60)
Glucose: 95 mg/dL (ref 65–100)
Potassium: 3.1 mmol/L — ABNORMAL LOW (ref 3.5–5.1)
Sodium: 137 mmol/L (ref 136–145)

## 2019-11-24 LAB — POCT GLUCOSE
POC Glucose: 207 mg/dL — ABNORMAL HIGH (ref 65–100)
POC Glucose: 139 mg/dL — ABNORMAL HIGH (ref 65–100)
POC Glucose: 146 mg/dL — ABNORMAL HIGH (ref 65–100)
POC Glucose: 166 mg/dL — ABNORMAL HIGH (ref 65–100)

## 2019-11-24 LAB — CBC WITH AUTO DIFFERENTIAL
Basophils %: 1 % (ref 0–1)
Basophils Absolute: 0 10*3/uL (ref 0.0–0.1)
Eosinophils %: 3 % (ref 0–7)
Eosinophils Absolute: 0.3 10*3/uL (ref 0.0–0.4)
Granulocyte Absolute Count: 0 10*3/uL (ref 0.00–0.04)
Hematocrit: 33.2 % — ABNORMAL LOW (ref 35.0–47.0)
Hemoglobin: 11.6 g/dL (ref 11.5–16.0)
Immature Granulocytes: 0 % (ref 0.0–0.5)
Lymphocytes %: 20 % (ref 12–49)
Lymphocytes Absolute: 1.6 10*3/uL (ref 0.8–3.5)
MCH: 31.6 PG (ref 26.0–34.0)
MCHC: 34.9 g/dL (ref 30.0–36.5)
MCV: 90.5 FL (ref 80.0–99.0)
MPV: 10.6 FL (ref 8.9–12.9)
Monocytes %: 8 % (ref 5–13)
Monocytes Absolute: 0.7 10*3/uL (ref 0.0–1.0)
NRBC Absolute: 0 10*3/uL (ref 0.00–0.01)
Neutrophils %: 68 % (ref 32–75)
Neutrophils Absolute: 5.7 10*3/uL (ref 1.8–8.0)
Nucleated RBCs: 0 PER 100 WBC
Platelets: 406 10*3/uL — ABNORMAL HIGH (ref 150–400)
RBC: 3.67 M/uL — ABNORMAL LOW (ref 3.80–5.20)
RDW: 12.5 % (ref 11.5–14.5)
WBC: 8.3 10*3/uL (ref 3.6–11.0)

## 2019-11-24 MED ORDER — PHARMACY VANCOMYCIN NOTE
Status: DC
Start: 2019-11-24 — End: 2019-12-01

## 2019-11-24 MED ORDER — OXYCODONE 5 MG TAB
5 mg | ORAL | Status: DC | PRN
Start: 2019-11-24 — End: 2019-11-24
  Administered 2019-11-24 (×4): via ORAL

## 2019-11-24 MED ORDER — POTASSIUM CHLORIDE SR 10 MEQ TAB
10 mEq | ORAL | Status: AC
Start: 2019-11-24 — End: 2019-11-24
  Administered 2019-11-24 (×2): via ORAL

## 2019-11-24 MED ORDER — SODIUM CHLORIDE 0.9 % IV
1000 mg | Freq: Two times a day (BID) | INTRAVENOUS | Status: DC
Start: 2019-11-24 — End: 2019-11-28
  Administered 2019-11-24 – 2019-11-28 (×9): via INTRAVENOUS

## 2019-11-24 MED FILL — PANTOPRAZOLE 40 MG TAB, DELAYED RELEASE: 40 mg | ORAL | Qty: 1

## 2019-11-24 MED FILL — OXYCODONE 5 MG TAB: 5 mg | ORAL | Qty: 1

## 2019-11-24 MED FILL — INSULIN LISPRO 100 UNIT/ML INJECTION: 100 unit/mL | SUBCUTANEOUS | Qty: 1

## 2019-11-24 MED FILL — ASPIRIN 81 MG TAB, DELAYED RELEASE: 81 mg | ORAL | Qty: 1

## 2019-11-24 MED FILL — INSULIN GLARGINE 100 UNIT/ML INJECTION: 100 unit/mL | SUBCUTANEOUS | Qty: 0.2

## 2019-11-24 MED FILL — INSULIN GLARGINE 100 UNIT/ML INJECTION: 100 unit/mL | SUBCUTANEOUS | Qty: 1

## 2019-11-24 MED FILL — K-TAB 10 MEQ TABLET,EXTENDED RELEASE: 10 mEq | ORAL | Qty: 4

## 2019-11-24 MED FILL — VANCOMYCIN 1,000 MG IV SOLR: 1000 mg | INTRAVENOUS | Qty: 1000

## 2019-11-24 MED FILL — AMLODIPINE 5 MG TAB: 5 mg | ORAL | Qty: 2

## 2019-11-24 MED FILL — ENOXAPARIN 40 MG/0.4 ML SUB-Q SYRINGE: 40 mg/0.4 mL | SUBCUTANEOUS | Qty: 0.4

## 2019-11-24 MED FILL — VANCOMYCIN 750 MG IV SOLUTION: 750 mg | INTRAVENOUS | Qty: 750

## 2019-11-24 MED FILL — PHARMACY VANCOMYCIN NOTE: Qty: 1

## 2019-11-24 NOTE — Consults (Signed)
Vascular Surgery Consult    Subjective:      Lindsey Huynh is a 69 y.o. female who presents for evaluation of left great toe ulceration.  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 It has been present for years and she has been [place] in [place] [facility]. Hasn't had much relief in 6 months. She gets pain with walking attributed to her neuropathy. Over the last week she noted changes in her right great toe. She presents with pain an devaluation.     Past Medical History:   Diagnosis Date   ??? Diabetes ([place])    ??? GERD (gastroesophageal reflux disease)    ??? Hypercholesterolemia    ??? Hypertension      Past Surgical History:   Procedure Laterality Date   ??? HX COLONOSCOPY     ??? HX ORTHOPAEDIC  06-30-10    back surgery (tumor removed)   ??? 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 No pertinent family history.  Social History     Socioeconomic History   ??? Marital status: SINGLE     Spouse name: Not on file   ??? Number of children: Not on file   ??? Years of education: Not on file   ??? Highest education level: Not on file   Tobacco Use   ??? Smoking status: Former Smoker     Years: 10.00   ??? Smokeless tobacco: Never Used   Substance and Sexual Activity   ??? Alcohol use: Yes     Alcohol/week: 5.0 standard drinks     Types: 6 Cans of beer per week   ??? Drug use: No   ??? Sexual activity: Not Currently      Current Facility-Administered Medications   Medication Dose Route Frequency Provider Last Rate Last Admin   ??? Vancomycin- Pharmacy to Dose   Other Rx Dosing/Monitoring Donato Schultz, MD       ??? vancomycin (VANCOCIN) 1,000 mg in 0.9% sodium chloride 250 mL (VIAL-MATE)  1,000 mg IntraVENous Q12H Donato Schultz, MD       ??? sodium chloride (NS) flush 5-40 mL  5-40 mL IntraVENous Q8H Donato Schultz, MD       ??? sodium chloride (NS) flush 5-40 mL  5-40 mL IntraVENous PRN Donato Schultz, MD       ??? acetaminophen (TYLENOL)  tablet 650 mg  650 mg Oral Q6H PRN Donato Schultz, MD        Or   ??? acetaminophen (TYLENOL) suppository 650 mg  650 mg Rectal Q6H PRN Donato Schultz, MD       ??? polyethylene glycol (MIRALAX) packet 17 g  17 g Oral DAILY PRN Donato Schultz, MD       ??? promethazine (PHENERGAN) tablet 12.5 mg  12.5 mg Oral Q6H PRN Donato Schultz, MD        Or   ??? ondansetron (ZOFRAN) injection 4 mg  4 mg IntraVENous Q6H PRN Donato Schultz, MD       ??? glucose chewable tablet 16 g  4 Tab Oral PRN Donato Schultz, MD       ??? dextrose (D50W) injection syrg 12.5-25 g  25-50 mL IntraVENous PRN Donato Schultz, MD       ??? glucagon (GLUCAGEN) injection 1 mg  1 mg IntraMUSCular PRN Donato Schultz, MD       ??? insulin glargine (LANTUS) injection 20 Units  20 Units SubCUTAneous QHS Donato Schultz, MD   20 Units at 11/23/19 2238   ??? insulin lispro (HUMALOG) injection   SubCUTAneous AC&HS Donato Schultz, MD   2 Units at 11/24/19 1152   ??? aspirin delayed-release tablet 81 mg  81 mg Oral DAILY Donato Schultz, MD   81 mg at 11/24/19 [id number]   ??? pantoprazole (PROTONIX) tablet 40 mg  40 mg Oral ACB Donato Schultz, MD   40 mg at 11/24/19 [id number]   ??? amLODIPine (NORVASC) tablet 10 mg  10 mg Oral DAILY Donato Schultz, MD   10 mg at 11/24/19 0837   ??? enoxaparin (LOVENOX) injection 40 mg  40 mg SubCUTAneous Q24H Donato Schultz, MD   40 mg at 11/23/19 1734   ??? oxyCODONE IR (ROXICODONE) tablet 5 mg  5 mg Oral Q4H PRN Rita Ohara, NP   5 mg at 11/24/19 1118        Allergies   Allergen Reactions   ??? Neuromuscular Blockers, Steroidal Other (comments)     GI Upset       Review of Systems:  A comprehensive review of systems was negative except for that written in the History of Present Illness.    Objective:        Patient Vitals for the past 8 hrs:   BP Temp Pulse Resp SpO2   11/24/19 0809 (!) 159/85 98.2 ??F (36.8 ??C) 72 16 100 %       Temp (24hrs), Avg:98.1 ??F (36.7 ??C), Min:97.6 ??F (36.4 ??C), Max:98.8 ??F (37.1 ??C)       Physical Exam:  GENERAL: alert, cooperative, no distress, appears stated age, LUNG: clear to auscultation bilaterally, HEART: regular rate and rhythm, S1, S2 normal, no murmur, click, rub or gallop, ABDOMEN: soft, non-tender. Bowel sounds normal. No masses,  no organomegaly, EXTREMITIES: LLE warm and dry, 1st toe with distal toe gangrene under the nail bed. No palpable pulse below the groin, SKIN: Normal., NEUROLOGIC: negative, PSYCHIATRIC: non focal    Assessment:     69 y/o AAF with left lower extremity peripheral vascular disease with ulceration    Plan:     She has distal ischemic changes in her left great toe. She has no obvious signs of infection and this likely all chronic peripheral vascular with rest pain and tissue loss. I have ordered ABIs for tomorrow and will likely need an angiogram in the near future. Thanks for the consult, following along.

## 2019-11-24 NOTE — Progress Notes (Signed)
Bedside and Verbal shift change report given to Tina, RN (oncoming nurse) by Zach I, RN (offgoing nurse). Report included the following information SBAR.

## 2019-11-24 NOTE — Progress Notes (Addendum)
1950:  Patient verbalized that she wanted something for pain but not oxycodone due to the fact she does not like the way it makes her "feel".    1953:  RN notified Phil, NP of this.  Per NP order for oxycodone would be discontinued and order for tramadol would be placed.  Will continue to monitor.  2015:  Student was appropriately supervised during medication administration by primary Registered Nurse.  2330:  Bedside shift change report given to Thedore Mins, RN (oncoming nurse) by Otila Kluver, RN (offgoing nurse). Report included the following information SBAR, Kardex, Intake/Output, MAR and Recent Results.

## 2019-11-24 NOTE — Progress Notes (Cosign Needed)
Hospitalist Progress Note          [name], NP  Please call operator and page for questions.  Call physician on-call through the operator 7pm-7am    Daily Progress Note: 11/24/2019    Primary care provider:Ivey, Gae Gallop, NP    Date of admission: 11/23/2019 12:32 PM    Admission Summary and Hospital Course:      From H&P 11/23/2019:  "Lindsey Huynh is a 69 y.o. female with past medical history of diabetes, insulin-dependent, GERD and hypertension comes for return of left foot pain and swelling.  Patient is awake, alert and oriented able to answer my question follow my request, data also obtained in the ED staff and extensive chart review.  As per collective reports, patient left great toe pain which associated darkening of the skin on the distal end of the toe and swelling of the left foot the medial aspect of the Of the last 1 week and has been progressively getting worse.  The only thing that he remembers was fungal nail cream.  She reported since her left second toe is in contact with the first toe and that pain he also became slightly cellulitic, she also has some dorsal swelling of the foot as well.  This all progress from being mildly achy to significant tender and swollen last 1 week and she came today because the pain was unbearable, 10/10, getting from her toe to her foot and made her ambulation significantly difficult.  Although she continues to have pain at rest.  She has not seek medical help for that until today, did not remember any trauma or injury to her feet.  She is hemodynamically otherwise stable in the ED on her own.  Patient was hemodynamically stable in the ED, and work revealed hypokalemia leukocytosis and x-ray the foot was without any acute abnormality.  Given concerns for dry gangrene and cellulitis, she was started on IV vancomycin.  Patient has no other medical complaints.  Patient will be admitted to hospitalist service for evaluation management."       Subjective:   Pt seen today in no acute distress sitting on side of the bed.  Left lower extremity appears edematous, left medial lower extremity with healing ulceration consistent with venous ulcer.  Left great toe with gangrenous area noted.  Patient reports that when she elevates her legs, it increases the pain.  She is able to ambulate.  Other than the pain she has no other concerns or complaints.     Assessment/Plan:       - Left foot pain, left great toe pain and swelling with dry gangrene, left 2nd toe cellulitic changes, POA  Admit to tele  X ray foot WNL  Given pain and gangrene, will get CT foot with contrast CT scan unrevealing, may require MRI or bone scan blood loss  No trauma noted/reported  Elevated Sed rate and CRP  Follow BC x 2  Received a dose of Vanc. Pharmacy to dose Vanc.   Pain control  Seen by vascular surgery; plan for flow studies tomorrow  ??  - IDDM2: hold metformin given IV dye for CT  Cont basal insulin, SSI  Diabetic diet  a1c 8.1  ??  - HTN:  Controlled  Cont home meds   ??  - GERD  Cont PPI  ??  -Hypokalemia  Replete, repeat labs in am         Review of Systems:     Full ROS complete with  pertinent positives and negatives as per HPI, otherwise negative  Objective:   Physical Exam:     Visit Vitals  BP (!) 147/82 (BP 1 Location: Right upper arm, BP Patient Position: At rest;Sitting)   Pulse 77   Temp 98.5 ??F (36.9 ??C)   Resp 16   Ht 5\' 5"  (1.651 m)   Wt 81.6 kg (180 lb)   SpO2 96%   BMI 29.95 kg/m??      O2 Device: Room air    Temp (24hrs), Avg:98.2 ??F (36.8 ??C), Min:97.9 ??F (36.6 ??C), Max:98.5 ??F (36.9 ??C)    No intake/output data recorded.   No intake/output data recorded.      General:  Alert, cooperative, no distress, appears stated age.   Lungs:   Clear to auscultation bilaterally.   Heart:  Regular rate and rhythm, S1, S2 normal, no murmur, click, rub or gallop.   Abdomen:   Soft, non-tender, non-distended. Bowel sounds normal.    Extremities: Extremities normal, atraumatic, no  cyanosis. LLE 3+ edema   Skin: Skin color, texture, turgor normal. LLE medial large scabbed ulcer; right great toe erythematous, swollen, small area dry gangrene noted   Neurologic: CNII-XII intact. MAE, A&Ox4     Data Review:       Recent Days:  Recent Labs     11/24/19  0057 11/23/19  1332   WBC 8.3 8.1   HGB 11.6 11.7   HCT 33.2* 34.3*   PLT 406* 417*     Recent Labs     11/24/19  0057 11/23/19  1332   NA 137 138   K 3.1* 3.2*   CL 103 104   CO2 27 30   GLU 95 170*   BUN 11 17   CREA 0.69 0.74   CA 9.8 10.0   ALB  --  4.0   ALT  --  23     No results for input(s): PH, PCO2, PO2, HCO3, FIO2 in the last 72 hours.    24 Hour Results:  Recent Results (from the past 24 hour(s))   CBC WITH AUTOMATED DIFF    Collection Time: 11/24/19 12:57 AM   Result Value Ref Range    WBC 8.3 3.6 - 11.0 K/uL    RBC 3.67 (L) 3.80 - 5.20 M/uL    HGB 11.6 11.5 - 16.0 g/dL    HCT 33.2 (L) 35.0 - 47.0 %    MCV 90.5 80.0 - 99.0 FL    MCH 31.6 26.0 - 34.0 PG    MCHC 34.9 30.0 - 36.5 g/dL    RDW 12.5 11.5 - 14.5 %    PLATELET 406 (H) 150 - 400 K/uL    MPV 10.6 8.9 - 12.9 FL    NRBC 0.0 0 PER 100 WBC    ABSOLUTE NRBC 0.00 0.00 - 0.01 K/uL    NEUTROPHILS 68 32 - 75 %    LYMPHOCYTES 20 12 - 49 %    MONOCYTES 8 5 - 13 %    EOSINOPHILS 3 0 - 7 %    BASOPHILS 1 0 - 1 %    IMMATURE GRANULOCYTES 0 0.0 - 0.5 %    ABS. NEUTROPHILS 5.7 1.8 - 8.0 K/UL    ABS. LYMPHOCYTES 1.6 0.8 - 3.5 K/UL    ABS. MONOCYTES 0.7 0.0 - 1.0 K/UL    ABS. EOSINOPHILS 0.3 0.0 - 0.4 K/UL    ABS. BASOPHILS 0.0 0.0 - 0.1 K/UL    ABS. IMM. GRANS. 0.0  0.00 - 0.04 K/UL    DF AUTOMATED     METABOLIC PANEL, BASIC    Collection Time: 11/24/19 12:57 AM   Result Value Ref Range    Sodium 137 136 - 145 mmol/L    Potassium 3.1 (L) 3.5 - 5.1 mmol/L    Chloride 103 97 - 108 mmol/L    CO2 27 21 - 32 mmol/L    Anion gap 7 5 - 15 mmol/L    Glucose 95 65 - 100 mg/dL    BUN 11 6 - 20 MG/DL    Creatinine 0.69 0.55 - 1.02 MG/DL    BUN/Creatinine ratio 16 12 - 20      GFR est AA >60 >60  ml/min/1.70m2    GFR est non-AA >60 >60 ml/min/1.26m2    Calcium 9.8 8.5 - 10.1 MG/DL   GLUCOSE, POC    Collection Time: 11/24/19  6:33 AM   Result Value Ref Range    Glucose (POC) 139 (H) 65 - 100 mg/dL    Performed by Burnice Logan L. (CON)    GLUCOSE, POC    Collection Time: 11/24/19 11:43 AM   Result Value Ref Range    Glucose (POC) 146 (H) 65 - 100 mg/dL    Performed by [place], POC    Collection Time: 11/24/19  4:25 PM   Result Value Ref Range    Glucose (POC) 166 (H) 65 - 100 mg/dL    Performed by [place], POC    Collection Time: 11/24/19  9:28 PM   Result Value Ref Range    Glucose (POC) 151 (H) 65 - 100 mg/dL    Performed by Lorna Dibble        Problem List:  Problem List as of 11/24/2019 Date Reviewed: June 01, 2018          Codes Class Noted - Resolved    Gangrene ([place]) ICD-10-CM: [id number]  ICD-9-CM: 785.4  11/23/2019 - Present        Type 2 diabetes mellitus with diabetic neuropathy ([place]) ICD-10-CM: E11.40  ICD-9-CM: 250.60, 357.2  10/30/2018 - Present        Type 2 diabetes with nephropathy ([place]) ICD-10-CM: E11.21  ICD-9-CM: 250.40, 583.81  02/23/2017 - Present        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  ICD-9-CM: 365.9  06/03/2016 - Present        Cataract of left eye ICD-10-CM: H26.9  ICD-9-CM: 366.9  06/03/2016 - Present        Postmenopausal ICD-10-CM: Z78.0  ICD-9-CM: V49.81  02/18/2014 - Present        Constipation ICD-10-CM: K59.00  ICD-9-CM: 564.00  01/03/2013 - Present        HTN (hypertension) ICD-10-CM: I10  ICD-9-CM: 401.9  12/11/2012 - Present        GERD (gastroesophageal reflux disease) ICD-10-CM: K21.9  ICD-9-CM: 530.81  12/11/2012 - Present        Hypercholesterolemia ICD-10-CM: E78.00  ICD-9-CM: 272.0  12/11/2012 - Present        Diabetes ([place]) ICD-10-CM: E11.9  ICD-9-CM: 250.00  12/11/2012 - Present              Medications reviewed  Current Facility-Administered Medications   Medication Dose Route Frequency   ??? Vancomycin- Pharmacy to Dose   Other Rx Dosing/Monitoring   ???  vancomycin (VANCOCIN) 1,000 mg in 0.9% sodium chloride 250 mL (VIAL-MATE)  1,000 mg IntraVENous Q12H   ??? sodium chloride (NS) flush  5-40 mL  5-40 mL IntraVENous Q8H   ??? sodium chloride (NS) flush 5-40 mL  5-40 mL IntraVENous PRN   ??? acetaminophen (TYLENOL) tablet 650 mg  650 mg Oral Q6H PRN    Or   ??? acetaminophen (TYLENOL) suppository 650 mg  650 mg Rectal Q6H PRN   ??? polyethylene glycol (MIRALAX) packet 17 g  17 g Oral DAILY PRN   ??? promethazine (PHENERGAN) tablet 12.5 mg  12.5 mg Oral Q6H PRN    Or   ??? ondansetron (ZOFRAN) injection 4 mg  4 mg IntraVENous Q6H PRN   ??? glucose chewable tablet 16 g  4 Tab Oral PRN   ??? dextrose (D50W) injection syrg 12.5-25 g  25-50 mL IntraVENous PRN   ??? glucagon (GLUCAGEN) injection 1 mg  1 mg IntraMUSCular PRN   ??? insulin glargine (LANTUS) injection 20 Units  20 Units SubCUTAneous QHS   ??? insulin lispro (HUMALOG) injection   SubCUTAneous AC&HS   ??? aspirin delayed-release tablet 81 mg  81 mg Oral DAILY   ??? pantoprazole (PROTONIX) tablet 40 mg  40 mg Oral ACB   ??? amLODIPine (NORVASC) tablet 10 mg  10 mg Oral DAILY   ??? enoxaparin (LOVENOX) injection 40 mg  40 mg SubCUTAneous Q24H       Care Plan discussed with: Patient/family, nurse      [name], NP  Hospitalist  Providers can reach me on [employer]

## 2019-11-24 NOTE — Progress Notes (Signed)
 Day #2 of Vancomycin  Indication:  SSTI- left great toe pain and swelling with dry gangrene, left 2nd toe cellulitis   -3/6 Foot XR and CT no evidence of osteo  Current regimen:  750 mg IV every 12 hours  Abx regimen:  Vancomycin monotherapy  ID Following ?: NO  Concomitant nephrotoxic drugs (requires more frequent monitoring): Contrast agents on 3/6  Frequency of BMP?: Daily x 3 ordered    Recent Labs     11/24/19  0057 11/23/19  1332   WBC 8.3 8.1   CREA 0.69 0.74   BUN 11 17     Est CrCl: 78.9 ml/min; UO: - ml/kg/hr  Temp (24hrs), Avg:98.1 F (36.7 C), Min:97.6 F (36.4 C), Max:98.8 F (37.1 C)    Cultures:   3/6 Blood: NGTD (prelim)    Goal trough = 10 - 15 mcg/mL    Recent trough history (date/time/level/dose/action taken):  None    Plan: Change to vancomycin 1000 mg IV q12h empirically given slight improvement in renal function and more favorable AUC:MIC ratio of >400 (calculated 438). Plan for trough level once at steady state, or sooner as clinically indicated. Pharmacy to follow patient daily and order levels / make dose adjustments as appropriate.

## 2019-11-24 NOTE — Progress Notes (Signed)
 1950:  Patient verbalized that she wanted something for pain but not oxycodone  due to the fact she does not like the way it makes her feel.    1953:  RN notified Phil, NP of this.  Per NP order for oxycodone  would be discontinued and order for tramadol would be placed.  Will continue to monitor.  2015:  Student was appropriately supervised during medication administration by primary Registered Nurse.  2330:  Bedside shift change report given to Zach, RN (oncoming nurse) by Ellouise, RN (offgoing nurse). Report included the following information SBAR, Kardex, Intake/Output, MAR and Recent Results.

## 2019-11-24 NOTE — Consults (Signed)
Vascular Surgery Consult    Subjective:      Lindsey Huynh is a 69 y.o. female who presents for evaluation of left great toe ulceration.  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 It has been present for years and she has been seeking treatment in [place] [facility]. Hasn't had much relief in 6 months. She gets pain with walking attributed to her neuropathy. Over the last week she noted changes in her right great toe. She presents with pain an devaluation.     Past Medical History:   Diagnosis Date   ??? Diabetes ([place])    ??? GERD (gastroesophageal reflux disease)    ??? Hypercholesterolemia    ??? Hypertension      Past Surgical History:   Procedure Laterality Date   ??? HX COLONOSCOPY     ??? HX ORTHOPAEDIC  06-30-10    back surgery (tumor removed)   ??? 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 No pertinent family history.  Social History     Socioeconomic History   ??? Marital status: SINGLE     Spouse name: Not on file   ??? Number of children: Not on file   ??? Years of education: Not on file   ??? Highest education level: Not on file   Tobacco Use   ??? Smoking status: Former Smoker     Years: 10.00   ??? Smokeless tobacco: Never Used   Substance and Sexual Activity   ??? Alcohol use: Yes     Alcohol/week: 5.0 standard drinks     Types: 6 Cans of beer per week   ??? Drug use: No   ??? Sexual activity: Not Currently      Current Facility-Administered Medications   Medication Dose Route Frequency Provider Last Rate Last Admin   ??? Vancomycin- Pharmacy to Dose   Other Rx Dosing/Monitoring Donato Schultz, MD       ??? vancomycin (VANCOCIN) 1,000 mg in 0.9% sodium chloride 250 mL (VIAL-MATE)  1,000 mg IntraVENous Q12H Donato Schultz, MD       ??? sodium chloride (NS) flush 5-40 mL  5-40 mL IntraVENous Q8H Donato Schultz, MD       ??? sodium chloride (NS) flush 5-40 mL  5-40 mL IntraVENous PRN Donato Schultz, MD       ??? acetaminophen (TYLENOL)  tablet 650 mg  650 mg Oral Q6H PRN Donato Schultz, MD        Or   ??? acetaminophen (TYLENOL) suppository 650 mg  650 mg Rectal Q6H PRN Donato Schultz, MD       ??? polyethylene glycol (MIRALAX) packet 17 g  17 g Oral DAILY PRN Donato Schultz, MD       ??? promethazine (PHENERGAN) tablet 12.5 mg  12.5 mg Oral Q6H PRN Donato Schultz, MD        Or   ??? ondansetron (ZOFRAN) injection 4 mg  4 mg IntraVENous Q6H PRN Donato Schultz, MD       ??? glucose chewable tablet 16 g  4 Tab Oral PRN Donato Schultz, MD       ??? dextrose (D50W) injection syrg 12.5-25 g  25-50 mL IntraVENous PRN Donato Schultz, MD       ??? glucagon (GLUCAGEN) injection 1 mg  1 mg IntraMUSCular PRN Donato Schultz, MD       ??? insulin glargine (LANTUS) injection 20 Units  20 Units SubCUTAneous QHS Donato Schultz, MD   20 Units at 11/23/19 2238   ??? insulin lispro (HUMALOG) injection   SubCUTAneous AC&HS Donato Schultz, MD   2 Units at 11/24/19 1152   ??? aspirin delayed-release tablet 81 mg  81 mg Oral DAILY Donato Schultz, MD   81 mg at 11/24/19 [id number]   ??? pantoprazole (PROTONIX) tablet 40 mg  40 mg Oral ACB Donato Schultz, MD   40 mg at 11/24/19 [id number]   ??? amLODIPine (NORVASC) tablet 10 mg  10 mg Oral DAILY Donato Schultz, MD   10 mg at 11/24/19 0837   ??? enoxaparin (LOVENOX) injection 40 mg  40 mg SubCUTAneous Q24H Donato Schultz, MD   40 mg at 11/23/19 1734   ??? oxyCODONE IR (ROXICODONE) tablet 5 mg  5 mg Oral Q4H PRN Rita Ohara, NP   5 mg at 11/24/19 1118        Allergies   Allergen Reactions   ??? Neuromuscular Blockers, Steroidal Other (comments)     GI Upset       Review of Systems:  A comprehensive review of systems was negative except for that written in the History of Present Illness.    Objective:        Patient Vitals for the past 8 hrs:   BP Temp Pulse Resp SpO2   11/24/19 0809 (!) 159/85 98.2 ??F (36.8 ??C) 72 16 100 %       Temp (24hrs), Avg:98.1 ??F (36.7 ??C), Min:97.6 ??F (36.4 ??C), Max:98.8 ??F (37.1  ??C)      Physical Exam:  GENERAL: alert, cooperative, no distress, appears stated age, LUNG: clear to auscultation bilaterally, HEART: regular rate and rhythm, S1, S2 normal, no murmur, click, rub or gallop, ABDOMEN: soft, non-tender. Bowel sounds normal. No masses,  no organomegaly, EXTREMITIES: LLE warm and dry, 1st toe with distal toe gangrene under the nail bed. No palpable pulse below the groin, SKIN: Normal., NEUROLOGIC: negative, PSYCHIATRIC: non focal    Assessment:     69 y/o AAF with left lower extremity peripheral vascular disease with ulceration    Plan:     She has distal ischemic changes in her left great toe. She has no obvious signs of infection and this likely all chronic peripheral vascular with rest pain and tissue loss. I have ordered ABIs for tomorrow and will likely need an angiogram in the near future. Thanks for the consult, following along.

## 2019-11-24 NOTE — Progress Notes (Deleted)
Progress Notes by Orlinda Blalock, NP at 11/24/19 1058                Author: Orlinda Blalock, NP  Service: Nurse Practitioner  Author Type: Nurse Practitioner       Filed: 11/24/19 2312  Date of Service: 11/24/19 1058  Status: Cosign Needed           Editor: Orlinda Blalock, NP (Nurse Practitioner)  Cosign Required: Yes                                                 Hospitalist Progress Note           [name], NP   Please call operator and page for questions.   Call physician on-call through the operator 7pm-7am      Daily Progress Note: 11/24/2019      Primary care provider:Ivey, Gae Gallop, NP      Date of admission: 11/23/2019 12:32 PM      Admission Summary and Hospital Course:        From H&P 11/23/2019:   "Lindsey Huynh is a 69 y.o. female with past medical history of diabetes, insulin-dependent, GERD and hypertension comes for return of left foot pain and swelling.   Patient is awake, alert and oriented able to answer my question follow my request, data also obtained in the ED staff and extensive chart review.  As per collective  reports, patient left great toe pain which associated darkening of the skin on the distal end of the toe and swelling of the left foot the  medial aspect of the Of the last 1 week and has been progressively getting worse.  The only thing that he remembers was fungal nail cream.  She reported since her left second toe is in contact with the first toe and that pain he also became slightly  cellulitic, she also has some dorsal swelling of the foot as well.  This all progress from being mildly achy to significant tender and swollen last 1 week and she came today because the pain was unbearable, 10/10, getting from her toe to her foot and  made her ambulation significantly difficult.  Although she continues to have pain at rest.  She has not seek medical help for that until today, did not remember any trauma or injury to her feet.  She is hemodynamically otherwise  stable in the ED on  her own.  Patient was hemodynamically stable in the ED, and work revealed hypokalemia leukocytosis and x-ray the foot was without any acute abnormality.  Given concerns for dry gangrene and cellulitis, she was started on IV vancomycin.  Patient has  no other medical complaints.  Patient will be admitted to hospitalist service for evaluation management."           Subjective:     Pt seen today in no acute distress sitting on side of the bed.  Left lower extremity appears edematous, left medial lower extremity with healing ulceration consistent with venous  ulcer.  Left great toe with gangrenous area noted.  Patient reports that when she elevates her legs, it increases the pain.  She is able to ambulate.  Other than the pain she has no other concerns or complaints.         Assessment/Plan:             -  Left foot pain, left great toe pain and swelling with dry gangrene, left 2nd toe cellulitic changes, POA   Admit to tele   X ray foot WNL   Given pain and gangrene, will get CT foot with contrast CT scan unrevealing, may require MRI or bone scan blood loss   No trauma noted/reported   Elevated Sed rate and CRP   Follow BC x 2   Received a dose of Vanc. Pharmacy to dose Vanc.    Pain control   Seen by vascular surgery; plan for flow studies tomorrow      - IDDM2: hold metformin given IV dye for CT   Cont basal insulin, SSI   Diabetic diet   a1c 8.1      - HTN:   Controlled   Cont home meds       - GERD   Cont PPI      -Hypokalemia   Replete, repeat labs in am                Review of Systems:        Full ROS complete with pertinent positives and negatives as per HPI, otherwise negative     Objective:     Physical Exam:       Visit Vitals      BP  (!) 147/82 (BP 1 Location: Right upper arm, BP Patient Position: At rest;Sitting)     Pulse  77     Temp  98.5 F (36.9 C)     Resp  16     Ht  5\' 5"  (1.651 m)     Wt  81.6 kg (180 lb)     SpO2  96%        BMI  29.95 kg/m        O2 Device:  Room air       Temp (24hrs), Avg:98.2 F (36.8 C), Min:97.9 F (36.6 C), Max:98.5 F (36.9 C)     No intake/output data recorded.    No intake/output data recorded.            General:   Alert, cooperative, no distress, appears stated age.        Lungs:    Clear to auscultation bilaterally.        Heart:   Regular rate and rhythm, S1, S2 normal, no murmur, click, rub or gallop.        Abdomen:    Soft, non-tender, non-distended. Bowel sounds normal.         Extremities:  Extremities normal, atraumatic, no cyanosis. LLE 3+ edema        Skin:  Skin color, texture, turgor normal. LLE medial large scabbed ulcer; right great toe erythematous, swollen, small area dry gangrene noted        Neurologic:  CNII-XII intact. MAE, A&Ox4          Data Review:          Recent Days:     Recent Labs            11/24/19   0057  11/23/19   1332     WBC  8.3  8.1     HGB  11.6  11.7     HCT  33.2*  34.3*         PLT  406*  417*          Recent Labs            11/24/19  0057  11/23/19   1332     NA  137  138     K  3.1*  3.2*     CL  103  104     CO2  27  30     GLU  95  170*     BUN  11  17     CREA  0.69  0.74     CA  9.8  10.0     ALB   --   4.0         ALT   --   23        No results for input(s): PH, PCO2, PO2, HCO3, FIO2 in the last 72 hours.      24 Hour Results:     Recent Results (from the past 24 hour(s))     CBC WITH AUTOMATED DIFF          Collection Time: 11/24/19 12:57 AM         Result  Value  Ref Range            WBC  8.3  3.6 - 11.0 K/uL       RBC  3.67 (L)  3.80 - 5.20 M/uL       HGB  11.6  11.5 - 16.0 g/dL       HCT  33.2 (L)  35.0 - 47.0 %       MCV  90.5  80.0 - 99.0 FL       MCH  31.6  26.0 - 34.0 PG       MCHC  34.9  30.0 - 36.5 g/dL       RDW  12.5  11.5 - 14.5 %       PLATELET  406 (H)  150 - 400 K/uL       MPV  10.6  8.9 - 12.9 FL       NRBC  0.0  0 PER 100 WBC       ABSOLUTE NRBC  0.00  0.00 - 0.01 K/uL       NEUTROPHILS  68  32 - 75 %       LYMPHOCYTES  20  12 - 49 %       MONOCYTES  8  5 - 13 %       EOSINOPHILS  3   0 - 7 %       BASOPHILS  1  0 - 1 %       IMMATURE GRANULOCYTES  0  0.0 - 0.5 %       ABS. NEUTROPHILS  5.7  1.8 - 8.0 K/UL       ABS. LYMPHOCYTES  1.6  0.8 - 3.5 K/UL       ABS. MONOCYTES  0.7  0.0 - 1.0 K/UL       ABS. EOSINOPHILS  0.3  0.0 - 0.4 K/UL       ABS. BASOPHILS  0.0  0.0 - 0.1 K/UL       ABS. IMM. GRANS.  0.0  0.00 - 0.04 K/UL       DF  AUTOMATED          METABOLIC PANEL, BASIC          Collection Time: 11/24/19 12:57 AM         Result  Value  Ref Range            Sodium  137  136 -  145 mmol/L       Potassium  3.1 (L)  3.5 - 5.1 mmol/L       Chloride  103  97 - 108 mmol/L       CO2  27  21 - 32 mmol/L       Anion gap  7  5 - 15 mmol/L       Glucose  95  65 - 100 mg/dL       BUN  11  6 - 20 MG/DL       Creatinine  0.69  0.55 - 1.02 MG/DL       BUN/Creatinine ratio  16  12 - 20         GFR est AA  >60  >60 ml/min/1.46m2       GFR est non-AA  >60  >60 ml/min/1.32m2       Calcium  9.8  8.5 - 10.1 MG/DL       GLUCOSE, POC          Collection Time: 11/24/19  6:33 AM         Result  Value  Ref Range            Glucose (POC)  139 (H)  65 - 100 mg/dL       Performed by  Burnice Logan L. (CON)         GLUCOSE, POC          Collection Time: 11/24/19 11:43 AM         Result  Value  Ref Range            Glucose (POC)  146 (H)  65 - 100 mg/dL       Performed by  Warren, POC          Collection Time: 11/24/19  4:25 PM         Result  Value  Ref Range            Glucose (POC)  166 (H)  65 - 100 mg/dL       Performed by  Clark, POC          Collection Time: 11/24/19  9:28 PM         Result  Value  Ref Range            Glucose (POC)  151 (H)  65 - 100 mg/dL            Performed by  Lorna Dibble             Problem List:      Problem List  as of 11/24/2019  Date Reviewed:  2018/06/11                        Codes  Class  Noted - Resolved             Gangrene ([place])  ICD-10-CM: [id number]   ICD-9-CM: 785.4    11/23/2019 - Present                       Type 2 diabetes mellitus with diabetic  neuropathy ([place])  ICD-10-CM: E11.40   ICD-9-CM: 250.60, 357.2    10/30/2018 - Present  Type 2 diabetes with nephropathy ([place])  ICD-10-CM: E11.21   ICD-9-CM: 250.40, 583.81    02/23/2017 - Present                       Glaucoma of left eye  ICD-10-CM: H40.9   ICD-9-CM: 365.9    06/03/2016 - Present                       Cataract of left eye  ICD-10-CM: H26.9   ICD-9-CM: 366.9    06/03/2016 - Present                       Postmenopausal  ICD-10-CM: Z78.0   ICD-9-CM: V49.81    02/18/2014 - Present                       Constipation  ICD-10-CM: K59.00   ICD-9-CM: 564.00    01/03/2013 - Present                       HTN (hypertension)  ICD-10-CM: I10   ICD-9-CM: 401.9    12/11/2012 - Present                       GERD (gastroesophageal reflux disease)  ICD-10-CM: K21.9   ICD-9-CM: 530.81    12/11/2012 - Present                       Hypercholesterolemia  ICD-10-CM: E78.00   ICD-9-CM: 272.0    12/11/2012 - Present                       Diabetes ([place])  ICD-10-CM: E11.9   ICD-9-CM: 250.00    12/11/2012 - Present                          Medications reviewed     Current Facility-Administered Medications          Medication  Dose  Route  Frequency           ?  Vancomycin- Pharmacy to Dose     Other  Rx Dosing/Monitoring     ?  vancomycin (VANCOCIN) 1,000 mg in 0.9% sodium chloride 250 mL (VIAL-MATE)   1,000 mg  IntraVENous  Q12H     ?  sodium chloride (NS) flush 5-40 mL   5-40 mL  IntraVENous  Q8H     ?  sodium chloride (NS) flush 5-40 mL   5-40 mL  IntraVENous  PRN     ?  acetaminophen (TYLENOL) tablet 650 mg   650 mg  Oral  Q6H PRN          Or           ?  acetaminophen (TYLENOL) suppository 650 mg   650 mg  Rectal  Q6H PRN     ?  polyethylene glycol (MIRALAX) packet 17 g   17 g  Oral  DAILY PRN     ?  promethazine (PHENERGAN) tablet 12.5 mg   12.5 mg  Oral  Q6H PRN          Or           ?  ondansetron (ZOFRAN) injection 4 mg   4 mg  IntraVENous  Q6H PRN     ?  glucose chewable tablet  16 g   4 Tab  Oral  PRN     ?   dextrose (D50W) injection syrg 12.5-25 g   25-50 mL  IntraVENous  PRN     ?  glucagon (GLUCAGEN) injection 1 mg   1 mg  IntraMUSCular  PRN     ?  insulin glargine (LANTUS) injection 20 Units   20 Units  SubCUTAneous  QHS     ?  insulin lispro (HUMALOG) injection     SubCUTAneous  AC&HS     ?  aspirin delayed-release tablet 81 mg   81 mg  Oral  DAILY           ?  pantoprazole (PROTONIX) tablet 40 mg   40 mg  Oral  ACB           ?  amLODIPine (NORVASC) tablet 10 mg   10 mg  Oral  DAILY           ?  enoxaparin (LOVENOX) injection 40 mg   40 mg  SubCUTAneous  Q24H           Care Plan discussed with: Patient/family, nurse         [name], NP   Hospitalist   Providers can reach me on [employer]

## 2019-11-24 NOTE — Progress Notes (Signed)
Day #2 of Vancomycin  Indication:  SSTI- left great toe pain and swelling with dry gangrene, left 2nd toe cellulitis   -3/6 Foot XR and CT no evidence of osteo  Current regimen:  750 mg IV every 12 hours  Abx regimen:  Vancomycin monotherapy  ID Following ?: NO  Concomitant nephrotoxic drugs (requires more frequent monitoring): Contrast agents on 3/6  Frequency of BMP?: Daily x 3 ordered    Recent Labs     11/24/19  0057 11/23/19  1332   WBC 8.3 8.1   CREA 0.69 0.74   BUN 11 17     Est CrCl: 78.9 ml/min; UO: - ml/kg/hr  Temp (24hrs), Avg:98.1 ??F (36.7 ??C), Min:97.6 ??F (36.4 ??C), Max:98.8 ??F (37.1 ??C)    Cultures:   3/6 Blood: NGTD (prelim)    Goal trough = 10 - 15 mcg/mL    Recent trough history (date/time/level/dose/action taken):  None    Plan: Change to vancomycin 1000 mg IV q12h empirically given slight improvement in renal function and more favorable AUC:MIC ratio of >400 (calculated 438). Plan for trough level once at steady state, or sooner as clinically indicated. Pharmacy to follow patient daily and order levels / make dose adjustments as appropriate.

## 2019-11-25 ENCOUNTER — Inpatient Hospital Stay: Admit: 2019-11-25 | Payer: Medicare (Managed Care) | Primary: Family

## 2019-11-25 DIAGNOSIS — J3089 Other allergic rhinitis: Secondary | ICD-10-CM | POA: Diagnosis not present

## 2019-11-25 DIAGNOSIS — J301 Allergic rhinitis due to pollen: Secondary | ICD-10-CM | POA: Diagnosis not present

## 2019-11-25 DIAGNOSIS — E1152 Type 2 diabetes mellitus with diabetic peripheral angiopathy with gangrene: Principal | ICD-10-CM

## 2019-11-25 LAB — GLUCOSE, POC
Glucose (POC): 151 mg/dL — ABNORMAL HIGH (ref 65–100)
Glucose (POC): 90 mg/dL (ref 65–100)
Glucose (POC): 109 mg/dL — ABNORMAL HIGH (ref 65–100)
Glucose (POC): 69 mg/dL (ref 65–100)
Glucose (POC): 138 mg/dL — ABNORMAL HIGH (ref 65–100)
Glucose (POC): 93 mg/dL (ref 65–100)
Glucose (POC): 89 mg/dL (ref 65–100)

## 2019-11-25 LAB — POST EXPOSURE PROFILE
HIV-1,2 Ab: NONREACTIVE
Hep B surface Ag Interp.: NEGATIVE
Hep C virus Ab Interp.: NONREACTIVE
Hepatitis B surface Ag: <0.1 Index
p24 Antigen: NONREACTIVE

## 2019-11-25 LAB — METABOLIC PANEL, BASIC
Anion gap: 4 mmol/L — ABNORMAL LOW (ref 5–15)
BUN/Creatinine ratio: 18 (ref 12–20)
BUN: 12 MG/DL (ref 6–20)
CO2: 26 mmol/L (ref 21–32)
Calcium: 10.2 MG/DL — ABNORMAL HIGH (ref 8.5–10.1)
Chloride: 107 mmol/L (ref 97–108)
Creatinine: 0.68 MG/DL (ref 0.55–1.02)
GFR est AA: >60 mL/min/{1.73_m2} (ref >60)
GFR est non-AA: >60 mL/min/{1.73_m2} (ref >60)
Glucose: 70 mg/dL (ref 65–100)
Potassium: 3.6 mmol/L (ref 3.5–5.1)
Sodium: 137 mmol/L (ref 136–145)

## 2019-11-25 LAB — CBC WITH AUTOMATED DIFF
ABS. BASOPHILS: 0.1 10*3/uL (ref 0.0–0.1)
ABS. EOSINOPHILS: 0.4 10*3/uL (ref 0.0–0.4)
ABS. IMM. GRANS.: 0 10*3/uL (ref 0.00–0.04)
ABS. LYMPHOCYTES: 1.9 10*3/uL (ref 0.8–3.5)
ABS. MONOCYTES: 0.7 10*3/uL (ref 0.0–1.0)
ABS. NEUTROPHILS: 4.6 10*3/uL (ref 1.8–8.0)
ABSOLUTE NRBC: 0 10*3/uL (ref 0.00–0.01)
BASOPHILS: 1 % (ref 0–1)
EOSINOPHILS: 5 % (ref 0–7)
HCT: 33.1 % — ABNORMAL LOW (ref 35.0–47.0)
HGB: 11.4 g/dL — ABNORMAL LOW (ref 11.5–16.0)
IMMATURE GRANULOCYTES: 0 % (ref 0.0–0.5)
LYMPHOCYTES: 25 % (ref 12–49)
MCH: 31.1 PG (ref 26.0–34.0)
MCHC: 34.4 g/dL (ref 30.0–36.5)
MCV: 90.4 FL (ref 80.0–99.0)
MONOCYTES: 9 % (ref 5–13)
MPV: 9.9 FL (ref 8.9–12.9)
NEUTROPHILS: 60 % (ref 32–75)
NRBC: 0 PER 100 WBC
PLATELET: 378 10*3/uL (ref 150–400)
RBC: 3.66 M/uL — ABNORMAL LOW (ref 3.80–5.20)
RDW: 13 % (ref 11.5–14.5)
WBC: 7.5 10*3/uL (ref 3.6–11.0)

## 2019-11-25 LAB — COVID-19 RAPID TEST: COVID-19 rapid test: NOT DETECTED

## 2019-11-25 LAB — SARS-COV-2

## 2019-11-25 LAB — BASIC METABOLIC PANEL
Anion Gap: 4 mmol/L — ABNORMAL LOW (ref 5–15)
BUN/Creatinine Ratio: 18 (ref 12–20)
BUN: 12 MG/DL (ref 6–20)
CO2: 26 mmol/L (ref 21–32)
Calcium: 10.2 MG/DL — ABNORMAL HIGH (ref 8.5–10.1)
Chloride: 107 mmol/L (ref 97–108)
Creatinine: 0.68 MG/DL (ref 0.55–1.02)
GFR African American: >60 mL/min/{1.73_m2} (ref >60)
Glucose: 70 mg/dL (ref 65–100)
Potassium: 3.6 mmol/L (ref 3.5–5.1)
Sodium: 137 mmol/L (ref 136–145)
eGFR NON-AA: >60 mL/min/{1.73_m2} (ref >60)

## 2019-11-25 LAB — POCT GLUCOSE
POC Glucose: 151 mg/dL — ABNORMAL HIGH (ref 65–100)
POC Glucose: 90 mg/dL (ref 65–100)
POC Glucose: 109 mg/dL — ABNORMAL HIGH (ref 65–100)
POC Glucose: 69 mg/dL (ref 65–100)
POC Glucose: 138 mg/dL — ABNORMAL HIGH (ref 65–100)
POC Glucose: 93 mg/dL (ref 65–100)
POC Glucose: 89 mg/dL (ref 65–100)

## 2019-11-25 LAB — CBC WITH AUTO DIFFERENTIAL
Basophils %: 1 % (ref 0–1)
Basophils Absolute: 0.1 10*3/uL (ref 0.0–0.1)
Eosinophils %: 5 % (ref 0–7)
Eosinophils Absolute: 0.4 10*3/uL (ref 0.0–0.4)
Granulocyte Absolute Count: 0 10*3/uL (ref 0.00–0.04)
Hematocrit: 33.1 % — ABNORMAL LOW (ref 35.0–47.0)
Hemoglobin: 11.4 g/dL — ABNORMAL LOW (ref 11.5–16.0)
Immature Granulocytes %: 0 % (ref 0.0–0.5)
Lymphocytes %: 25 % (ref 12–49)
Lymphocytes Absolute: 1.9 10*3/uL (ref 0.8–3.5)
MCH: 31.1 PG (ref 26.0–34.0)
MCHC: 34.4 g/dL (ref 30.0–36.5)
MCV: 90.4 FL (ref 80.0–99.0)
MPV: 9.9 FL (ref 8.9–12.9)
Monocytes %: 9 % (ref 5–13)
Monocytes Absolute: 0.7 10*3/uL (ref 0.0–1.0)
NRBC Absolute: 0 10*3/uL (ref 0.00–0.01)
Neutrophils %: 60 % (ref 32–75)
Neutrophils Absolute: 4.6 10*3/uL (ref 1.8–8.0)
Nucleated RBCs: 0 PER 100 WBC
Platelets: 378 10*3/uL (ref 150–400)
RBC: 3.66 M/uL — ABNORMAL LOW (ref 3.80–5.20)
RDW: 13 % (ref 11.5–14.5)
WBC: 7.5 10*3/uL (ref 3.6–11.0)

## 2019-11-25 LAB — COVID-19, RAPID: SARS-CoV-2, Rapid: NOT DETECTED

## 2019-11-25 LAB — COVID-19

## 2019-11-25 MED ORDER — SODIUM CHLORIDE 0.9 % IJ SYRG
INTRAMUSCULAR | Status: DC | PRN
Start: 2019-11-25 — End: 2019-11-25

## 2019-11-25 MED ORDER — LACTATED RINGERS IV
INTRAVENOUS | Status: DC
Start: 2019-11-25 — End: 2019-11-25
  Administered 2019-11-25: 21:00:00 via INTRAVENOUS

## 2019-11-25 MED ORDER — FENTANYL CITRATE (PF) 50 MCG/ML IJ SOLN
50 mcg/mL | INTRAMUSCULAR | Status: DC | PRN
Start: 2019-11-25 — End: 2019-11-25
  Administered 2019-11-25 (×2): via INTRAVENOUS

## 2019-11-25 MED ORDER — POLYETHYLENE GLYCOL 3350 17 GRAM (100 %) ORAL POWDER PACKET
17 gram | Freq: Every day | ORAL | Status: DC | PRN
Start: 2019-11-25 — End: 2019-12-18

## 2019-11-25 MED ORDER — DEXMEDETOMIDINE 100 MCG/ML IV SOLN
100 mcg/mL | INTRAVENOUS | Status: AC
Start: 2019-11-25 — End: ?

## 2019-11-25 MED ORDER — LIDOCAINE (PF) 10 MG/ML (1 %) IJ SOLN
10 mg/mL (1 %) | INTRAMUSCULAR | Status: DC | PRN
Start: 2019-11-25 — End: 2019-11-25
  Administered 2019-11-25: 20:00:00 via SUBCUTANEOUS

## 2019-11-25 MED ORDER — IOPAMIDOL 76 % IV SOLN
370 mg iodine /mL (76 %) | INTRAVENOUS | Status: AC
Start: 2019-11-25 — End: ?

## 2019-11-25 MED ORDER — OXYCODONE-ACETAMINOPHEN 5 MG-325 MG TAB
5-325 mg | ORAL | Status: DC | PRN
Start: 2019-11-25 — End: 2019-11-25

## 2019-11-25 MED ORDER — TRAMADOL 50 MG TAB
50 mg | ORAL | Status: AC
Start: 2019-11-25 — End: 2019-11-24
  Administered 2019-11-25: 01:00:00 via ORAL

## 2019-11-25 MED ORDER — FENTANYL CITRATE (PF) 50 MCG/ML IJ SOLN
50 mcg/mL | INTRAMUSCULAR | Status: AC
Start: 2019-11-25 — End: ?

## 2019-11-25 MED ORDER — MORPHINE 2 MG/ML INJECTION
2 mg/mL | INTRAMUSCULAR | Status: DC | PRN
Start: 2019-11-25 — End: 2019-11-25

## 2019-11-25 MED ORDER — LIDOCAINE HCL 1 % (10 MG/ML) IJ SOLN
10 mg/mL (1 %) | INTRAMUSCULAR | Status: AC
Start: 2019-11-25 — End: ?

## 2019-11-25 MED ORDER — GABAPENTIN 300 MG CAP
300 mg | Freq: Every evening | ORAL | Status: DC
Start: 2019-11-25 — End: 2019-12-03
  Administered 2019-11-25 – 2019-12-03 (×10): via ORAL

## 2019-11-25 MED ORDER — PROPOFOL 10 MG/ML IV EMUL
10 mg/mL | INTRAVENOUS | Status: DC | PRN
Start: 2019-11-25 — End: 2019-11-25
  Administered 2019-11-25 (×3): via INTRAVENOUS

## 2019-11-25 MED ORDER — MEPERIDINE (PF) 25 MG/ML INJ SOLUTION
25 mg/ml | INTRAMUSCULAR | Status: DC | PRN
Start: 2019-11-25 — End: 2019-11-25

## 2019-11-25 MED ORDER — BISACODYL 10 MG RECTAL SUPPOSITORY
10 mg | Freq: Every day | RECTAL | Status: DC | PRN
Start: 2019-11-25 — End: 2019-12-18

## 2019-11-25 MED ORDER — HYDROCODONE-ACETAMINOPHEN 5 MG-325 MG TAB
5-325 mg | ORAL | Status: DC | PRN
Start: 2019-11-25 — End: 2019-11-27
  Administered 2019-11-25 – 2019-11-27 (×12): via ORAL

## 2019-11-25 MED ORDER — SODIUM CHLORIDE 0.9 % IV
INTRAVENOUS | Status: DC
Start: 2019-11-25 — End: 2019-11-25
  Administered 2019-11-25: 19:00:00 via INTRAVENOUS

## 2019-11-25 MED ORDER — MIDAZOLAM 1 MG/ML IJ SOLN
1 mg/mL | INTRAMUSCULAR | Status: DC | PRN
Start: 2019-11-25 — End: 2019-11-25

## 2019-11-25 MED ORDER — MIDAZOLAM 1 MG/ML IJ SOLN
1 mg/mL | INTRAMUSCULAR | Status: AC
Start: 2019-11-25 — End: ?

## 2019-11-25 MED ORDER — DOCUSATE SODIUM 100 MG CAP
100 mg | Freq: Two times a day (BID) | ORAL | Status: DC
Start: 2019-11-25 — End: 2019-12-18
  Administered 2019-11-25 – 2019-12-18 (×36): via ORAL

## 2019-11-25 MED ORDER — ONDANSETRON (PF) 4 MG/2 ML INJECTION
4 mg/2 mL | INTRAMUSCULAR | Status: DC | PRN
Start: 2019-11-25 — End: 2019-11-25

## 2019-11-25 MED ORDER — PHARMACY VANCOMYCIN NOTE
Freq: Once | Status: AC
Start: 2019-11-25 — End: 2019-11-26
  Administered 2019-11-26: 10:00:00

## 2019-11-25 MED ORDER — KETAMINE 50 MG/5 ML (10 MG/ML) IN NS IV SYRINGE
50 mg/5 mL (10 mg/mL) | INTRAVENOUS | Status: AC
Start: 2019-11-25 — End: ?

## 2019-11-25 MED ORDER — ACETAMINOPHEN 325 MG TABLET
325 mg | Freq: Once | ORAL | Status: AC
Start: 2019-11-25 — End: 2019-11-25
  Administered 2019-11-25: 18:00:00 via ORAL

## 2019-11-25 MED ORDER — MORPHINE 2 MG/ML INJECTION
2 mg/mL | INTRAMUSCULAR | Status: DC | PRN
Start: 2019-11-25 — End: 2019-11-28
  Administered 2019-11-27: 21:00:00 via INTRAVENOUS

## 2019-11-25 MED ORDER — SODIUM CHLORIDE 0.9 % IJ SYRG
Freq: Three times a day (TID) | INTRAMUSCULAR | Status: DC
Start: 2019-11-25 — End: 2019-11-25
  Administered 2019-11-25: 21:00:00 via INTRAVENOUS

## 2019-11-25 MED ORDER — SENNOSIDES 8.6 MG TAB
8.6 mg | Freq: Every day | ORAL | Status: DC
Start: 2019-11-25 — End: 2019-12-18
  Administered 2019-11-26 – 2019-12-15 (×18): via ORAL

## 2019-11-25 MED ORDER — KETAMINE 10 MG/ML IJ SOLN
10 mg/mL | INTRAMUSCULAR | Status: DC | PRN
Start: 2019-11-25 — End: 2019-11-25
  Administered 2019-11-25: 20:00:00 via INTRAVENOUS

## 2019-11-25 MED ORDER — HYDROMORPHONE (PF) 1 MG/ML IJ SOLN
1 mg/mL | INTRAMUSCULAR | Status: DC | PRN
Start: 2019-11-25 — End: 2019-11-25

## 2019-11-25 MED ORDER — HYDROCHLOROTHIAZIDE 25 MG TAB
25 mg | Freq: Every day | ORAL | Status: DC
Start: 2019-11-25 — End: 2019-12-18
  Administered 2019-11-25 – 2019-12-08 (×11): via ORAL

## 2019-11-25 MED ORDER — DIPHENHYDRAMINE HCL 50 MG/ML IJ SOLN
50 mg/mL | INTRAMUSCULAR | Status: DC | PRN
Start: 2019-11-25 — End: 2019-11-25

## 2019-11-25 MED ORDER — LACTATED RINGERS IV
INTRAVENOUS | Status: DC
Start: 2019-11-25 — End: 2019-11-25
  Administered 2019-11-25: 18:00:00 via INTRAVENOUS

## 2019-11-25 MED ORDER — TRAMADOL 50 MG TAB
50 mg | ORAL | Status: DC | PRN
Start: 2019-11-25 — End: 2019-11-27
  Administered 2019-11-25: 08:00:00 via ORAL

## 2019-11-25 MED ORDER — HEPARIN (PORCINE) 1,000 UNIT/ML IJ SOLN
1000 unit/mL | INTRAMUSCULAR | Status: DC | PRN
Start: 2019-11-25 — End: 2019-11-25
  Administered 2019-11-25: 20:00:00

## 2019-11-25 MED ORDER — MIDAZOLAM 1 MG/ML IJ SOLN
1 mg/mL | INTRAMUSCULAR | Status: DC | PRN
Start: 2019-11-25 — End: 2019-11-25
  Administered 2019-11-25 (×2): via INTRAVENOUS

## 2019-11-25 MED ORDER — SODIUM CHLORIDE 0.9 % IJ SYRG
Freq: Three times a day (TID) | INTRAMUSCULAR | Status: DC
Start: 2019-11-25 — End: 2019-11-25
  Administered 2019-11-25: 19:00:00 via INTRAVENOUS

## 2019-11-25 MED ORDER — FENTANYL CITRATE (PF) 50 MCG/ML IJ SOLN
50 mcg/mL | INTRAMUSCULAR | Status: DC | PRN
Start: 2019-11-25 — End: 2019-11-25

## 2019-11-25 MED ORDER — IOPAMIDOL 76 % IV SOLN
370 mg iodine /mL (76 %) | INTRAVENOUS | Status: DC | PRN
Start: 2019-11-25 — End: 2019-11-25
  Administered 2019-11-25: 20:00:00

## 2019-11-25 MED ORDER — LOSARTAN 50 MG TAB
50 mg | Freq: Every day | ORAL | Status: DC
Start: 2019-11-25 — End: 2019-12-10
  Administered 2019-11-25 – 2019-12-06 (×9): via ORAL

## 2019-11-25 MED ORDER — IOPAMIDOL 76 % IV SOLN
37076 mg iodine /mL (76 %) | INTRAVENOUS | Status: AC
Start: 2019-11-25 — End: ?

## 2019-11-25 MED FILL — ISOVUE-370  76 % INTRAVENOUS SOLUTION: 370 mg iodine /mL (76 %) | INTRAVENOUS | Qty: 50

## 2019-11-25 MED FILL — PRECEDEX 100 MCG/ML INTRAVENOUS SOLUTION: 100 mcg/mL | INTRAVENOUS | Qty: 2

## 2019-11-25 MED FILL — DOK 100 MG CAPSULE: 100 mg | ORAL | Qty: 1

## 2019-11-25 MED FILL — INSULIN GLARGINE 100 UNIT/ML INJECTION: 100 unit/mL | SUBCUTANEOUS | Qty: 1

## 2019-11-25 MED FILL — FENTANYL CITRATE (PF) 50 MCG/ML IJ SOLN: 50 mcg/mL | INTRAMUSCULAR | Qty: 2

## 2019-11-25 MED FILL — MIDAZOLAM 1 MG/ML IJ SOLN: 1 mg/mL | INTRAMUSCULAR | Qty: 2

## 2019-11-25 MED FILL — ENOXAPARIN 40 MG/0.4 ML SUB-Q SYRINGE: 40 mg/0.4 mL | SUBCUTANEOUS | Qty: 0.4

## 2019-11-25 MED FILL — HYDROCODONE-ACETAMINOPHEN 5 MG-325 MG TAB: 5-325 mg | ORAL | Qty: 1

## 2019-11-25 MED FILL — VANCOMYCIN 1,000 MG IV SOLR: 1000 mg | INTRAVENOUS | Qty: 1000

## 2019-11-25 MED FILL — GABAPENTIN 300 MG CAP: 300 mg | ORAL | Qty: 1

## 2019-11-25 MED FILL — LOSARTAN 50 MG TAB: 50 mg | ORAL | Qty: 2

## 2019-11-25 MED FILL — KETAMINE 50 MG/5 ML (10 MG/ML) IN NS IV SYRINGE: 50 mg/5 mL (10 mg/mL) | INTRAVENOUS | Qty: 5

## 2019-11-25 MED FILL — NORMAL SALINE FLUSH 0.9 % INJECTION SYRINGE: INTRAMUSCULAR | Qty: 40

## 2019-11-25 MED FILL — HYDROCHLOROTHIAZIDE 25 MG TAB: 25 mg | ORAL | Qty: 1

## 2019-11-25 MED FILL — ACETAMINOPHEN 325 MG TABLET: 325 mg | ORAL | Qty: 2

## 2019-11-25 MED FILL — DEXTROSE 50% IN WATER (D50W) IV SYRG: INTRAVENOUS | Qty: 50

## 2019-11-25 MED FILL — PANTOPRAZOLE 40 MG TAB, DELAYED RELEASE: 40 mg | ORAL | Qty: 1

## 2019-11-25 MED FILL — TRAMADOL 50 MG TAB: 50 mg | ORAL | Qty: 1

## 2019-11-25 MED FILL — LIDOCAINE HCL 1 % (10 MG/ML) IJ SOLN: 10 mg/mL (1 %) | INTRAMUSCULAR | Qty: 20

## 2019-11-25 MED FILL — PHARMACY VANCOMYCIN NOTE: Qty: 1

## 2019-11-25 MED FILL — AMLODIPINE 5 MG TAB: 5 mg | ORAL | Qty: 2

## 2019-11-25 MED FILL — LACTATED RINGERS IV: INTRAVENOUS | Qty: 1000

## 2019-11-25 NOTE — Progress Notes (Signed)
Bedside and Verbal shift change report given to [place], Therapist, sports (Soil scientist) by Lilia Pro, RN (offgoing nurse). Report included the following information SBAR.

## 2019-11-25 NOTE — Wound Image (Signed)
Consulted for toe.  In OR and unable to see.   Followed by Dr. Timmothy Sours - available as needed per Dr. Timmothy Sours.   Alena Bills, CWOCN

## 2019-11-25 NOTE — Anesthesia Pre-Procedure Evaluation (Signed)
Relevant Problems   CARDIOVASCULAR   (+) HTN (hypertension)      GASTROINTESTINAL   (+) GERD (gastroesophageal reflux disease)      ENDOCRINE   (+) Diabetes (HCC)   (+) Type 2 diabetes mellitus with diabetic neuropathy (HCC)   (+) Type 2 diabetes with nephropathy (HCC)       Anesthetic History   No history of anesthetic complications            Review of Systems / Medical History  Patient summary reviewed, nursing notes reviewed and pertinent labs reviewed    Pulmonary  Within defined limits                 Neuro/Psych   Within defined limits           Cardiovascular    Hypertension          PAD         GI/Hepatic/Renal  Within defined limits   GERD           Endo/Other  Within defined limits  Diabetes         Other Findings              Physical Exam    Airway  Mallampati: II  TM Distance: > 6 cm  Neck ROM: normal range of motion   Mouth opening: Normal     Cardiovascular  Regular rate and rhythm,  S1 and S2 normal,  no murmur, click, rub, or gallop             Dental  No notable dental hx       Pulmonary  Breath sounds clear to auscultation               Abdominal  GI exam deferred       Other Findings            Anesthetic Plan    ASA: 3  Anesthesia type: MAC            Anesthetic plan and risks discussed with: Patient

## 2019-11-25 NOTE — Anesthesia Post-Procedure Evaluation (Signed)
Procedure(s):  LEFT LEG ARTERIOGRAM.    MAC    Anesthesia Post Evaluation        Patient location during evaluation: PACU  Note status: Adequate.  Level of consciousness: responsive to verbal stimuli and sleepy but conscious  Pain management: satisfactory to patient  Airway patency: patent  Anesthetic complications: no  Cardiovascular status: acceptable  Respiratory status: acceptable  Hydration status: acceptable  Comments: +Post-Anesthesia Evaluation and Assessment    Patient: Lindsey Huynh MRN: [id number]  SSN: [id number]   Date of Birth: 11/15/1950  Age: 68 y.o.  Sex: female          Cardiovascular Function/Vital Signs    BP (!) 156/91    Pulse 69    Temp 36.6 ??C (97.8 ??F)    Resp 15    Ht 5' 5" (1.651 m)    Wt 81.6 kg (180 lb)    SpO2 97%    BMI 29.95 kg/m??     Patient is status post Procedure(s) with comments:  LEFT LEG ARTERIOGRAM - MAC CONVERTED TO GENERAL.    Nausea/Vomiting: Controlled.    Postoperative hydration reviewed and adequate.    Pain:  Pain Scale 1: Numeric (0 - 10) (11/25/19 1547)  Pain Intensity 1: 0 (11/25/19 1547)   Managed.    Neurological Status:   Neuro (WDL): Within Defined Limits (11/25/19 1547)   At baseline.    Mental Status and Level of Consciousness: Arousable.    Pulmonary Status:   O2 Device: Room air (11/25/19 1547)   Adequate oxygenation and airway patent.    Complications related to anesthesia: None    Post-anesthesia assessment completed. No concerns.    I have evaluated the patient and the patient is stable and ready to be discharged from PACU .    Signed By: Laine Giovanetti M Elyshia Kumagai, MD    11/25/2019        INITIAL Post-op Vital signs:   Vitals Value Taken Time   BP 174/86 11/25/19 1600   Temp 36.6 ??C (97.8 ??F) 11/25/19 1517   Pulse 69 11/25/19 1604   Resp 13 11/25/19 1604   SpO2 100 % 11/25/19 1604   Vitals shown include unvalidated device data.

## 2019-11-25 NOTE — Progress Notes (Signed)
Vascular:    Plan left leg arteriogram, possible angioplasty today - she agrees.

## 2019-11-25 NOTE — Other (Signed)
TRANSFER - OUT REPORT:    Verbal report given to [facility] RN(name) on Machaela Gratton  being transferred to 5S(unit) for routine post - op       Report consisted of patient???s Situation, Background, Assessment and   Recommendations(SBAR).     Time Pre op antibiotic given:N  Anesthesia Stop time: N  Foley Present on Transfer to floor:N  Order for Foley on Chart:N  Discharge Prescriptions with Chart:N    Information from the following report(s) SBAR was reviewed with the receiving nurse.    Opportunity for questions and clarification was provided.     Is the patient on 02? NO       L/Min        Other     Is the patient on a monitor? NO    Is the nurse transporting with the patient? NO    Surgical Waiting Area notified of patient's transfer from PACU? NO      The following personal items collected during your admission accompanied patient upon transfer:   Dental Appliance: Dental Appliances: None  Vision:    Hearing Aid:    Jewelry:    Clothing:    Other Valuables:    Valuables sent to safe:

## 2019-11-25 NOTE — Other (Signed)
Patient: Lindsey Huynh MRN: [id number]  SSN: [id number]   Date of Birth: 01/03/1951  Age: 69 y.o.  Sex: female     Patient is status post Procedure(s) with comments:  LEFT LEG ARTERIOGRAM - MAC CONVERTED TO GENERAL.    Surgeon(s) and Role:     * Belenda Cruise, MD - Primary    Local/Dose/Irrigation:                    Peripheral IV 11/23/19 Right Antecubital (Active)   Site Assessment Clean, dry, & intact 11/25/19 0910   Phlebitis Assessment 0 11/25/19 0910   Infiltration Assessment 0 11/25/19 0910   Dressing Status Clean, dry, & intact 11/25/19 0910   Dressing Type Transparent 11/25/19 0910   Hub Color/Line Status Capped 11/25/19 0910   Action Taken Open ports on tubing capped 11/25/19 0910   Alcohol Cap Used Yes 11/25/19 0910                           Dressing/Packing:  Wound Toe (Comment  which one) Left-Dressing/Treatment: Open to air (11/25/19 0910)  Incision 11/25/19 Groin Right-Dressing/Treatment: (4x4's and medipore tape) (11/25/19 1500)    Splint/Cast:  ]    Other:  *

## 2019-11-25 NOTE — Group Note (Signed)
Diabetes Mgmt by Florian Buff, CNS at 11/25/19 1111                Author: Florian Buff, CNS  Service: Certified Clinical Nurse Specialist  Author Type: Clinical Nurse Specialist       Filed: 11/25/19 1157  Date of Service: 11/25/19 1111  Status: Signed          Editor: Florian Buff, CNS (Clinical Nurse Specialist)               Lindsey Huynh   [facility]      CLINICAL NURSE SPECIALIST CONSULT    INITIAL NOTE     Initial Presentation     Lindsey Huynh is a 69 y.o.  female admitted over weekend with left foot pain . Left great toe gangrenous.   Initial work up and evaluation reveals NO osteomyelitits.        HX:      Past Medical History:        Diagnosis  Date         ?  Diabetes (HCC)       ?  GERD (gastroesophageal reflux disease)       ?  Hypercholesterolemia           ?  Hypertension              DX: CT-no osteo/ Xray-unremarkable      TX: Vascular surgery consulted- left leg medially with healing ulcerations consistent with venous ulcer. Plan for flow studies today.         Hospital course     Clinical progress has been complicated by venous insufficiency - BG trends mostly within goal during admission.     Diabetes      Patient has known Type 2 diabetes, treated with Levemir and Metformin  PTA.       Admission BG 170 and A1c 8.15 indicate more acceptable diabetes control. Previous A1C 03/2019 11.0% . Highest A1C Feb. 2020-12.1%.    Ambulatory blood glucose management provided by primary care provider-Last seen 11/05/2019 virtual visit   Consulted by Provider for advanced diabetes nursing assessment and care, specifically related  to    []  Transitioning off Glucostabilizer    []  Inpatient management strategy   [x]  Home management assessment   []  Survival skill education      Diabetes-related medical history   Acute complications   NONE   Neurological complications   Peripheral neuropathy   Microvascular disease   NONE   Macrovascular disease   Peripheral vascular  disease and Foot wounds   Other associated conditions      HTN/hypercholesteremia/      Diabetes medication history        Drug class  Currently in use  Discontinued  Never used          Biguanide  Metformin 1000mg  daily in divided doses              DDP-4 inhibitor                 Sulfonylurea                Thiazolidinedione                GLP-1 RA                SGLT-2 inhibitors                Basal insulin  Levemir 20units daily              Bolus insulin                Fixed Dose  Combinations                Subjective     I'm so glad my A1C is improved, "ive been working on it"      Patient reports the following home diabetes self-care practices:  Eating pattern-cut out "sugar", made some diet changes, with carb intake, especially breads.       Physical activity pattern-desires to be physically active, but recent pain in left leg has her unable to do any physical activity      Monitoring pattern-checks every other day before and after meals.        Taking medications pattern   [x]  Consistent administration   [x]  Affordable      Social determinants of health impacting diabetes self-management practices    Concerned that you need to know more about how to stay healthy with diabetes         Objective     Physical exam   General Alert, oriented and in no acute distress. Conversant and cooperative.    Vital Signs    Visit Vitals      BP  (!) 157/72 (BP 1 Location: Left upper arm, BP Patient Position: Sitting)     Pulse  77     Temp  97.8 F (36.6 C)     Resp  12     Ht  5\' 5"  (1.651 m)     Wt  81.6 kg (180 lb)     SpO2  97%        BMI  29.95 kg/m        Skin  Warm and dry.     Heart   Regular rate and rhythm. No murmurs, rubs or gallops   Lungs  Clear to auscultation without rales or rhonchi   Extremities      Diabetic foot exam:     Left Foot-=+2 edema noted left lower leg       Visual Exam: ulcer- left great toe under nail and 2nd toe-black eschar noted    Pulse DP: 1+ (weak)    Filament test: reduced  sensation        Right Foot    Visual Exam: normal     Pulse DP: 2+ (normal)    Filament test: reduced sensation      DP & PT pulses +2.       Laboratory   BMP:      Lab Results         Component  Value  Date/Time            NA  137  11/25/2019 04:58 AM       K  3.6  11/25/2019 04:58 AM       CL  107  11/25/2019 04:58 AM       CO2  26  11/25/2019 04:58 AM       AGAP  4 (L)  11/25/2019 04:58 AM       GLU  70  11/25/2019 04:58 AM       BUN  12  11/25/2019 04:58 AM       CREA  0.68  11/25/2019 04:58 AM       GFRAA  >60  11/25/2019 04:58 AM  GFRNA  >60  11/25/2019 04:58 AM         Factors impacting BG management       Factor  Dose  Comments         Nutrition:   Carb-controlled meals        60 grams/meal          Pain  Left foot           Infection             Other: venous insuffiencey   Vascular surgery following  Flow studies pending        Blood glucose pattern                Assessment and Plan        Nursing Diagnosis  Risk for unstable blood glucose pattern     Nursing Intervention Domain  5250 Decision-making Support        Nursing Interventions  Examined current inpatient diabetes control    Explored factors facilitating and impeding inpatient management   Identified self-management practices impeding diabetes control   Explored corrective strategies with patient and responsible inpatient provider    Informed patient of rational for insulin strategy while hospitalized      Instructed patient in importance of checking blood glucose DAILY before meals - discussed when she should check her BG after meals-2hours          Evaluation     [name], with Type 2 diabetes, did not achieve diabetes control prior to admission, as evidenced by admission BG of 170 and A1c of 81%.  She is followed closely by her PCP  for continued diabetes management.  She last saw PCP via virtual visit in Feb. 2021. Her A1C is improved since July 2020 when it was >11%. She is concerned about the fate of her left foot, but is  encouraged that her A1C has lowered significantly since  July 2020.        BG trends have normalized with PTA basal insulin until today.  BG prior to lunch-69mg /dl.  Hypoglycemia protocol followed since patient NPO, IV dextrose given.  Patient was not symptomatic.       Recommendations        []  Use of  Subcutaneous Insulin Order set [id number])       Insulin  Dosing  Specific recommendation         CONTINUE Basal                                      (Based on weight, BMI & GFR)  20units daily  REDUCE basal dose by 20% if fastimg BG <100, DO NOT HOLD.         CONTINUE Corrective                                       (Useful in adjusting insulin dosing)  [x]   Normal sensitivity                     Billing Code(s)     [x]  04540 IP subsequent hospital  care - 35 minutes      Before making these care recommendations, I personally reviewed the hospitalization record, including notes, laboratory & diagnostic data and current medications, and  examined the patient at the bedside (circumstances permitting) before making care recommendations.      Total minutes: 45      Florian Buff, CNS   Diabetes Clinical Nurse Specialist   [facility]   Access via Perfect Serve

## 2019-11-25 NOTE — Progress Notes (Signed)
Bedside and Verbal shift change report given to [place], Charity fundraiser (Cabin crew) by Lequita Halt, RN (offgoing nurse). Report included the following information SBAR.

## 2019-11-25 NOTE — Progress Notes (Signed)
Bedside and Verbal shift change report given to Morgan, RN (oncoming nurse) by Zach I, RN (offgoing nurse). Report included the following information SBAR.

## 2019-11-25 NOTE — Op Note (Signed)
Brief Postoperative Note    Patient: Lindsey Huynh  Date of Birth: [birth year]  MRN: [id number]    Date of Procedure: 11/25/2019     Pre-Op Diagnosis: Peripheral vascular disease    Post-Op Diagnosis: Same as preoperative diagnosis.      Procedure(s):  LEFT LEG ARTERIOGRAM    Surgeon(s):  Belenda Cruise, MD    Surgical Assistant: Surg Asst-1: Luis Abed    Anesthesia: MAC     Estimated Blood Loss (mL): Minimal    Complications: None    Specimens: * No specimens in log *     Implants: * No implants in log *    Drains: * No LDAs found *    Findings: left popliteal occlusion, unable to cross, will need bypass    Electronically Signed by Belenda Cruise, MD on 11/25/2019 at 2:56 PM

## 2019-11-25 NOTE — Anesthesia Post-Procedure Evaluation (Signed)
Procedure(s):  LEFT LEG ARTERIOGRAM.    MAC    Anesthesia Post Evaluation        Patient location during evaluation: PACU  Note status: Adequate.  Level of consciousness: responsive to verbal stimuli and sleepy but conscious  Pain management: satisfactory to patient  Airway patency: patent  Anesthetic complications: no  Cardiovascular status: acceptable  Respiratory status: acceptable  Hydration status: acceptable  Comments: +Post-Anesthesia Evaluation and Assessment    Patient: Lindsey Huynh MRN: [id number]  SSN: [id number]   Date of Birth: 03/03/1951  Age: 69 y.o.  Sex: female          Cardiovascular Function/Vital Signs    BP (!) 156/91    Pulse 69    Temp 36.6 ??C (97.8 ??F)    Resp 15    Ht _0  (1.651 m)    Wt 81.6 kg (180 lb)    SpO2 97%    BMI 29.95 kg/m??     Patient is status post Procedure(s) with comments:  LEFT LEG ARTERIOGRAM - MAC CONVERTED TO GENERAL.    Nausea/Vomiting: Controlled.    Postoperative hydration reviewed and adequate.    Pain:  Pain Scale 1: Numeric (0 - 10) (11/25/19 1547)  Pain Intensity 1: 0 (11/25/19 1547)   Managed.    Neurological Status:   Neuro (WDL): Within Defined Limits (11/25/19 1547)   At baseline.    Mental Status and Level of Consciousness: Arousable.    Pulmonary Status:   O2 Device: Room air (11/25/19 1547)   Adequate oxygenation and airway patent.    Complications related to anesthesia: None    Post-anesthesia assessment completed. No concerns.    I have evaluated the patient and the patient is stable and ready to be discharged from PACU .    Signed By: Irene Pap, MD    11/25/2019        INITIAL Post-op Vital signs:   Vitals Value Taken Time   BP 174/86 11/25/19 1600   Temp 36.6 ??C (97.8 ??F) 11/25/19 1517   Pulse 69 11/25/19 1604   Resp 13 11/25/19 1604   SpO2 100 % 11/25/19 1604   Vitals shown include unvalidated device data.

## 2019-11-25 NOTE — Anesthesia Pre-Procedure Evaluation (Signed)
Relevant Problems   CARDIOVASCULAR   (+) HTN (hypertension)      GASTROINTESTINAL   (+) GERD (gastroesophageal reflux disease)      ENDOCRINE   (+) Diabetes (HCC)   (+) Type 2 diabetes mellitus with diabetic neuropathy (HCC)   (+) Type 2 diabetes with nephropathy (HCC)       Anesthetic History   No history of anesthetic complications            Review of Systems / Medical History  Patient summary reviewed, nursing notes reviewed and pertinent labs reviewed    Pulmonary  Within defined limits                 Neuro/Psych   Within defined limits           Cardiovascular    Hypertension          PAD         GI/Hepatic/Renal  Within defined limits   GERD           Endo/Other  Within defined limits  Diabetes         Other Findings              Physical Exam    Airway  Mallampati: II  TM Distance: > 6 cm  Neck ROM: normal range of motion   Mouth opening: Normal     Cardiovascular  Regular rate and rhythm,  S1 and S2 normal,  no murmur, click, rub, or gallop             Dental  No notable dental hx       Pulmonary  Breath sounds clear to auscultation               Abdominal  GI exam deferred       Other Findings            Anesthetic Plan    ASA: 3  Anesthesia type: MAC            Anesthetic plan and risks discussed with: Patient

## 2019-11-25 NOTE — Progress Notes (Deleted)
Progress Notes by Orlinda Blalock, NP at 11/25/19 1411                Author: Orlinda Blalock, NP  Service: Nurse Practitioner  Author Type: Nurse Practitioner       Filed: 11/26/19 0102  Date of Service: 11/25/19 1411  Status: Cosign Needed           Editor: Orlinda Blalock, NP (Nurse Practitioner)  Cosign Required: Yes                                                 Hospitalist Progress Note           [name], NP   Please call operator and page for questions.   Call physician on-call through the operator 7pm-7am      Daily Progress Note: 11/26/2019      Primary care provider:Ivey, Gae Gallop, NP      Date of admission: 11/23/2019 12:32 PM      Admission Summary and Hospital Course:        From H&P 11/23/2019:   "Lindsey Huynh is a 69 y.o. female with past medical history of diabetes, insulin-dependent, GERD and hypertension comes for return of left foot pain and swelling.   Patient is awake, alert and oriented able to answer my question follow my request, data also obtained in the ED staff and extensive chart review.  As per collective  reports, patient left great toe pain which associated darkening of the skin on the distal end of the toe and swelling of the left foot the  medial aspect of the Of the last 1 week and has been progressively getting worse.  The only thing that he remembers was fungal nail cream.  She reported since her left second toe is in contact with the first toe and that pain he also became slightly  cellulitic, she also has some dorsal swelling of the foot as well.  This all progress from being mildly achy to significant tender and swollen last 1 week and she came today because the pain was unbearable, 10/10, getting from her toe to her foot and  made her ambulation significantly difficult.  Although she continues to have pain at rest.  She has not seek medical help for that until today, did not remember any trauma or injury to her feet.  She is hemodynamically otherwise  stable in the ED on  her own.  Patient was hemodynamically stable in the ED, and work revealed hypokalemia leukocytosis and x-ray the foot was without any acute abnormality.  Given concerns for dry gangrene and cellulitis, she was started on IV vancomycin.  Patient has  no other medical complaints.  Patient will be admitted to hospitalist service for evaluation management."           Subjective:     F/U for left foot cellulitis, left great toe gangrene, DMII, HTN, GERD, Hypokalemia      Pt seen today in no acute distress. Will have flow studies today and possibly angiogram. Pain well controlled. No other complaints.         Assessment/Plan:             - Left foot pain, left great toe pain and swelling with dry gangrene, left 2nd toe cellulitic changes, POA   X ray foot WNL  Given pain and gangrene,   No trauma noted/reported   Elevated Sed rate and CRP   Follow BC x 2   Received a dose of Vanc. Pharmacy to dose Vanc.    Pain control   Seen by vascular surgery; plan for flow studies today      IDDM2: hold metformin while IP   Cont basal insulin, SSI   Diabetic diet   a1c 8.1      - HTN:   Controlled   Cont home meds       - GERD   Cont PPI      -Hypokalemia   Resolved      DVTppx: SCDs, lovenox   Gippx: Protonix   Code Status: Full code   Diet: Diabetic   Activity: OOB to chair TID and PRN   Discharge: Plan to discharge home pending progress and ultimate plan; ID, podiatry and vascular surg following                   Review of Systems:        Full ROS complete with pertinent positives and negatives as per HPI, otherwise negative     Objective:     Physical Exam:       Visit Vitals      BP  129/74     Pulse  85     Temp  98.3 F (36.8 C)     Resp  16     Ht  5\' 5"  (1.651 m)     Wt  81.6 kg (180 lb)     SpO2  99%        BMI  29.95 kg/m      O2 Flow Rate (L/min): 2 l/min  O2 Device: Room air      Temp (24hrs), Avg:98 F (36.7 C), Min:97.8 F (36.6 C), Max:98.7 F (37.1 C)     No intake/output data recorded.     03/07 0701 - 03/08 1900   In: 500 [I.V.:500]   Out: 300 [Urine:300]            General:   Alert, cooperative, no distress, appears stated age.        Lungs:    Clear to auscultation bilaterally.        Heart:   Regular rate and rhythm, S1, S2 normal, no murmur, click, rub or gallop.        Abdomen:    Soft, non-tender, non-distended. Bowel sounds normal.         Extremities:  Extremities normal, atraumatic, no cyanosis. LLE 3+ edema     Skin:  Skin color, texture, turgor normal. LLE medial large scabbed ulcer; right great toe erythematous, swollen, small area dry gangrene noted        Neurologic:  CNII-XII intact. MAE, A&Ox4          Data Review:          Recent Days:     Recent Labs             11/25/19   0458  11/24/19   0057  11/23/19   1332     WBC  7.5  8.3  8.1     HGB  11.4*  11.6  11.7     HCT  33.1*  33.2*  34.3*          PLT  378  406*  417*          Recent Labs  11/25/19   0458  11/24/19   0057  11/23/19   1332     NA  137  137  138     K  3.6  3.1*  3.2*     CL  107  103  104     CO2  26  27  30      GLU  70  95  170*     BUN  12  11  17      CREA  0.68  0.69  0.74     CA  10.2*  9.8  10.0     ALB   --    --   4.0          ALT   --    --   23        No results for input(s): PH, PCO2, PO2, HCO3, FIO2 in the last 72 hours.      24 Hour Results:     Recent Results (from the past 24 hour(s))     CBC WITH AUTOMATED DIFF          Collection Time: 11/25/19  4:58 AM         Result  Value  Ref Range            WBC  7.5  3.6 - 11.0 K/uL       RBC  3.66 (L)  3.80 - 5.20 M/uL       HGB  11.4 (L)  11.5 - 16.0 g/dL       HCT  33.1 (L)  35.0 - 47.0 %       MCV  90.4  80.0 - 99.0 FL       MCH  31.1  26.0 - 34.0 PG            MCHC  34.4  30.0 - 36.5 g/dL            RDW  13.0  11.5 - 14.5 %       PLATELET  378  150 - 400 K/uL       MPV  9.9  8.9 - 12.9 FL       NRBC  0.0  0 PER 100 WBC       ABSOLUTE NRBC  0.00  0.00 - 0.01 K/uL       NEUTROPHILS  60  32 - 75 %       LYMPHOCYTES  25  12 - 49 %       MONOCYTES   9  5 - 13 %       EOSINOPHILS  5  0 - 7 %       BASOPHILS  1  0 - 1 %       IMMATURE GRANULOCYTES  0  0.0 - 0.5 %       ABS. NEUTROPHILS  4.6  1.8 - 8.0 K/UL       ABS. LYMPHOCYTES  1.9  0.8 - 3.5 K/UL       ABS. MONOCYTES  0.7  0.0 - 1.0 K/UL       ABS. EOSINOPHILS  0.4  0.0 - 0.4 K/UL       ABS. BASOPHILS  0.1  0.0 - 0.1 K/UL       ABS. IMM. GRANS.  0.0  0.00 - 0.04 K/UL       DF  AUTOMATED          METABOLIC PANEL, BASIC  Collection Time: 11/25/19  4:58 AM         Result  Value  Ref Range            Sodium  137  136 - 145 mmol/L       Potassium  3.6  3.5 - 5.1 mmol/L       Chloride  107  97 - 108 mmol/L       CO2  26  21 - 32 mmol/L       Anion gap  4 (L)  5 - 15 mmol/L       Glucose  70  65 - 100 mg/dL       BUN  12  6 - 20 MG/DL       Creatinine  0.68  0.55 - 1.02 MG/DL       BUN/Creatinine ratio  18  12 - 20         GFR est AA  >60  >60 ml/min/1.14m2            GFR est non-AA  >60  >60 ml/min/1.9m2            Calcium  10.2 (H)  8.5 - 10.1 MG/DL       GLUCOSE, POC          Collection Time: 11/25/19  6:18 AM         Result  Value  Ref Range            Glucose (POC)  90  65 - 100 mg/dL       Performed by  Weldon Inches         COVID-19 RAPID TEST          Collection Time: 11/25/19 10:25 AM         Result  Value  Ref Range            Specimen source  Nasopharyngeal          COVID-19 rapid test  Not detected  NOTD         SARS-COV-2          Collection Time: 11/25/19 10:25 AM         Result  Value  Ref Range            SARS-CoV-2  Please find results under separate order          GLUCOSE, POC          Collection Time: 11/25/19 11:30 AM         Result  Value  Ref Range            Glucose (POC)  69  65 - 100 mg/dL       Performed by  Sandi Raveling         GLUCOSE, POC          Collection Time: 11/25/19 12:02 PM         Result  Value  Ref Range            Glucose (POC)  109 (H)  65 - 100 mg/dL       Performed by  Tonia Ghent         GLUCOSE, POC          Collection Time: 11/25/19 12:32 PM         Result  Value   Ref Range            Glucose (POC)  138 (H)  65 - 100 mg/dL       Performed by  Vikki Ports         GLUCOSE, POC          Collection Time: 11/25/19  3:17 PM         Result  Value  Ref Range            Glucose (POC)  93  65 - 100 mg/dL       Performed by  Terence Lux         GLUCOSE, POC          Collection Time: 11/25/19  4:56 PM         Result  Value  Ref Range            Glucose (POC)  89  65 - 100 mg/dL       Performed by  [place], POC          Collection Time: 11/25/19 10:22 PM         Result  Value  Ref Range            Glucose (POC)  158 (H)  65 - 100 mg/dL            Performed by  Malva Cogan             Problem List:      Problem List  as of 11/26/2019  Date Reviewed:  Jun 12, 2018                        Codes  Class  Noted - Resolved             Gangrene ([place])  ICD-10-CM: [id number]   ICD-9-CM: 785.4    11/23/2019 - Present                       Type 2 diabetes mellitus with diabetic neuropathy ([place])  ICD-10-CM: E11.40   ICD-9-CM: 250.60, 357.2    10/30/2018 - Present                       Type 2 diabetes with nephropathy ([place])  ICD-10-CM: E11.21   ICD-9-CM: 250.40, 583.81    02/23/2017 - Present                       Glaucoma of left eye  ICD-10-CM: H40.9   ICD-9-CM: 365.9    06/03/2016 - Present                       Cataract of left eye  ICD-10-CM: H26.9   ICD-9-CM: 366.9    06/03/2016 - Present                       Postmenopausal  ICD-10-CM: Z78.0   ICD-9-CM: V49.81    02/18/2014 - Present                       Constipation  ICD-10-CM: K59.00   ICD-9-CM: 564.00    01/03/2013 - Present                       HTN (hypertension)  ICD-10-CM: I10   ICD-9-CM: 401.9    12/11/2012 - Present  GERD (gastroesophageal reflux disease)  ICD-10-CM: K21.9   ICD-9-CM: 530.81    12/11/2012 - Present                       Hypercholesterolemia  ICD-10-CM: E78.00   ICD-9-CM: 272.0    12/11/2012 - Present                       Diabetes ([place])  ICD-10-CM: E11.9   ICD-9-CM: 250.00    12/11/2012  - Present                          Medications reviewed     Current Facility-Administered Medications          Medication  Dose  Route  Frequency           ?  Vancomycin, Trough - Please draw IMMEDIATELY PRIOR to starting 0400 dose on Tuesday, 3/09. Thanks!     Other  ONCE     ?  Vancomycin- Pharmacy to Dose     Other  Rx Dosing/Monitoring     ?  vancomycin (VANCOCIN) 1,000 mg in 0.9% sodium chloride 250 mL (VIAL-MATE)   1,000 mg  IntraVENous  Q12H     ?  gabapentin (NEURONTIN) capsule 300 mg   300 mg  Oral  QHS     ?  losartan (COZAAR) tablet 100 mg   100 mg  Oral  DAILY     ?  traMADoL (ULTRAM) tablet 50 mg   50 mg  Oral  Q4H PRN     ?  HYDROcodone-acetaminophen (NORCO) 5-325 mg per tablet 1 Tab   1 Tab  Oral  Q4H PRN     ?  morphine injection 1 mg   1 mg  IntraVENous  Q4H PRN     ?  docusate sodium (COLACE) capsule 100 mg   100 mg  Oral  BID     ?  senna (SENOKOT) tablet 8.6 mg   1 Tab  Oral  DAILY     ?  polyethylene glycol (MIRALAX) packet 17 g   17 g  Oral  DAILY PRN     ?  bisacodyL (DULCOLAX) suppository 10 mg   10 mg  Rectal  DAILY PRN     ?  hydroCHLOROthiazide (HYDRODIURIL) tablet 25 mg   25 mg  Oral  DAILY     ?  sodium chloride (NS) flush 5-40 mL   5-40 mL  IntraVENous  Q8H     ?  sodium chloride (NS) flush 5-40 mL   5-40 mL  IntraVENous  PRN     ?  acetaminophen (TYLENOL) tablet 650 mg   650 mg  Oral  Q6H PRN          Or           ?  acetaminophen (TYLENOL) suppository 650 mg   650 mg  Rectal  Q6H PRN     ?  polyethylene glycol (MIRALAX) packet 17 g   17 g  Oral  DAILY PRN     ?  promethazine (PHENERGAN) tablet 12.5 mg   12.5 mg  Oral  Q6H PRN          Or           ?  ondansetron (ZOFRAN) injection 4 mg   4 mg  IntraVENous  Q6H PRN           ?  glucose chewable tablet 16 g   4 Tab  Oral  PRN           ?  dextrose (D50W) injection syrg 12.5-25 g   25-50 mL  IntraVENous  PRN     ?  glucagon (GLUCAGEN) injection 1 mg   1 mg  IntraMUSCular  PRN     ?  insulin glargine (LANTUS) injection 20 Units   20 Units   SubCUTAneous  QHS     ?  insulin lispro (HUMALOG) injection     SubCUTAneous  AC&HS     ?  aspirin delayed-release tablet 81 mg   81 mg  Oral  DAILY     ?  pantoprazole (PROTONIX) tablet 40 mg   40 mg  Oral  ACB     ?  amLODIPine (NORVASC) tablet 10 mg   10 mg  Oral  DAILY           ?  enoxaparin (LOVENOX) injection 40 mg   40 mg  SubCUTAneous  Q24H           Care Plan discussed with: Patient/family, nurse         [name], NP   Hospitalist   Providers can reach me on PerfectServe

## 2019-11-25 NOTE — Interval H&P Note (Signed)
 TRANSFER - OUT REPORT:    Verbal report given to [facility] RN(name) on Lindsey Huynh  being transferred to 5S(unit) for routine post - op       Report consisted of patient's Situation, Background, Assessment and   Recommendations(SBAR).     Time Pre op antibiotic given:N  Anesthesia Stop time: N  Foley Present on Transfer to floor:N  Order for Foley on Chart:N  Discharge Prescriptions with Chart:N    Information from the following report(s) SBAR was reviewed with the receiving nurse.    Opportunity for questions and clarification was provided.     Is the patient on 02? NO       L/Min        Other     Is the patient on a monitor? NO    Is the nurse transporting with the patient? NO    Surgical Waiting Area notified of patient's transfer from PACU? NO      The following personal items collected during your admission accompanied patient upon transfer:   Dental Appliance: Dental Appliances: None  Vision:    Hearing Aid:    Jewelry:    Clothing:    Other Valuables:    Valuables sent to safe:

## 2019-11-25 NOTE — Brief Op Note (Signed)
Brief Postoperative Note    Patient: Lindsey Huynh  Date of Birth: 1951-04-16  MRN: [id number]    Date of Procedure: 11/25/2019     Pre-Op Diagnosis: Peripheral vascular disease    Post-Op Diagnosis: Same as preoperative diagnosis.      Procedure(s):  LEFT LEG ARTERIOGRAM    Surgeon(s):  Belenda Cruise, MD    Surgical Assistant: Surg Asst-1: Luis Abed    Anesthesia: MAC     Estimated Blood Loss (mL): Minimal    Complications: None    Specimens: * No specimens in log *     Implants: * No implants in log *    Drains: * No LDAs found *    Findings: left popliteal occlusion, unable to cross, will need bypass    Electronically Signed by Belenda Cruise, MD on 11/25/2019 at 2:56 PM

## 2019-11-25 NOTE — Other (Signed)
[place]    CLINICAL NURSE SPECIALIST CONSULT   INITIAL NOTE  Initial Presentation   Lindsey Huynh is a 69 y.o. female admitted over weekend with left foot pain. Left great toe gangrenous.   Initial work up and evaluation reveals NO osteomyelitits.      HX:   Past Medical History:   Diagnosis Date   ??? Diabetes ([place])    ??? GERD (gastroesophageal reflux disease)    ??? Hypercholesterolemia    ??? Hypertension         DX: CT-no osteo/ Xray-unremarkable    TX: Vascular surgery consulted- left leg medially with healing ulcerations consistent with venous ulcer. Plan for flow studies today.     Hospital course   Clinical progress has been complicated by venous insufficiency - BG trends mostly within goal during admission.  Diabetes    Patient has known Type 2 diabetes, treated with Levemir and Metformin PTA.     Admission BG 170 and A1c 8.15 indicate more acceptable diabetes control. Previous A1C 03/2019 11.0% . Highest A1C Feb. 2020-12.1%.   Ambulatory blood glucose management provided by primary care provider-Last seen 11/05/2019 virtual visit  Consulted by Provider for advanced diabetes nursing assessment and care, specifically related to   []  Transitioning off Glucostabilizer   []  Inpatient management strategy  [x]  Home management assessment  []  Survival skill education    Diabetes-related medical history  Acute complications  NONE  Neurological complications  Peripheral neuropathy  Microvascular disease  NONE  Macrovascular disease  Peripheral vascular disease and Foot wounds  Other associated conditions     HTN/hypercholesteremia/    Diabetes medication history  Drug class Currently in use Discontinued Never used   Biguanide Metformin 1000mg  daily in divided doses     DDP-4 inhibitor       Sulfonylurea      Thiazolidinedione      GLP-1 RA      SGLT-2 inhibitors      Basal insulin Levemir 20units daily     Bolus insulin      Fixed Dose  Combinations        Subjective   ???I'm so glad my A1C  is improved, "ive been working on it"    Patient reports the following home diabetes self-care practices:  Eating pattern-cut out "sugar", made some diet changes, with carb intake, especially breads.     Physical activity pattern-desires to be physically active, but recent pain in left leg has her unable to do any physical activity    Monitoring pattern-checks every other day before and after meals.      Taking medications pattern  [x]  Consistent administration  [x]  Affordable    Social determinants of health impacting diabetes self-management practices   Concerned that you need to know more about how to stay healthy with diabetes     Objective   Physical exam  General Alert, oriented and in no acute distress. Conversant and cooperative.   Vital Signs   Visit Vitals  BP (!) 157/72 (BP 1 Location: Left upper arm, BP Patient Position: Sitting)   Pulse 77   Temp 97.8 ??F (36.6 ??C)   Resp 12   Ht 5\' 5"  (1.651 m)   Wt 81.6 kg (180 lb)   SpO2 97%   BMI 29.95 kg/m??     Skin  Warm and dry.    Heart   Regular rate and rhythm. No murmurs, rubs or gallops  Lungs  Clear to auscultation  without rales or rhonchi  Extremities     Diabetic foot exam:    Left Foot-=+2 edema noted left lower leg      Visual Exam: ulcer- left great toe under nail and 2nd toe-black eschar noted   Pulse DP: 1+ (weak)   Filament test: reduced sensation      Right Foot   Visual Exam: normal    Pulse DP: 2+ (normal)   Filament test: reduced sensation     DP & PT pulses +2.     Laboratory  BMP:   Lab Results   Component Value Date/Time    NA 137 11/25/2019 04:58 AM    K 3.6 11/25/2019 04:58 AM    CL 107 11/25/2019 04:58 AM    CO2 26 11/25/2019 04:58 AM    AGAP 4 (L) 11/25/2019 04:58 AM    GLU 70 11/25/2019 04:58 AM    BUN 12 11/25/2019 04:58 AM    CREA 0.68 11/25/2019 04:58 AM    GFRAA >60 11/25/2019 04:58 AM    GFRNA >60 11/25/2019 04:58 AM      Factors impacting BG management  Factor Dose Comments   Nutrition:  Carb-controlled meals     60 grams/meal       Pain Left foot    Infection     Other: venous insuffiencey  Vascular surgery following Flow studies pending     Blood glucose pattern        Assessment and Plan   Nursing Diagnosis Risk for unstable blood glucose pattern   Nursing Intervention Domain 5250 Decision-making Support   Nursing Interventions Examined current inpatient diabetes control   Explored factors facilitating and impeding inpatient management  Identified self-management practices impeding diabetes control  Explored corrective strategies with patient and responsible inpatient provider   Informed patient of rational for insulin strategy while hospitalized    Instructed patient in importance of checking blood glucose DAILY before meals - discussed when she should check her BG after meals-2hours     Evaluation   [name], with Type 2 diabetes, did not achieve diabetes control prior to admission, as evidenced by admission BG of 170 and A1c of 81%.  She is followed closely by her PCP for continued diabetes management.  She last saw PCP via virtual visit in Feb. 2021. Her A1C is improved since July 2020 when it was >11%. She is concerned about the fate of her left foot, but is encouraged that her A1C has lowered significantly since July 2020.      BG trends have normalized with PTA basal insulin until today.  BG prior to lunch-69mg /dl.  Hypoglycemia protocol followed since patient NPO, IV dextrose given.  Patient was not symptomatic.    Recommendations     []  Use of Subcutaneous Insulin Order set [id number])  Insulin Dosing Specific recommendation   CONTINUE Basal                                      (Based on weight, BMI & GFR) 20units daily REDUCE basal dose by 20% if fastimg BG <100, DO NOT HOLD.   CONTINUE Corrective                                       (Useful in adjusting insulin dosing) [x]  Normal sensitivity  Billing Code(s)   [x]  [id number] IP subsequent hospital care - 35 minutes    Before making these care recommendations, I personally  reviewed the hospitalization record, including notes, laboratory & diagnostic data and current medications, and   examined the patient at the bedside (circumstances permitting) before making care recommendations.     Total minutes: 45    Lily Lovings, CNS  Diabetes Clinical Nurse Specialist  [facility]  Access via [place]

## 2019-11-25 NOTE — Op Note (Signed)
[place]  OPERATIVE REPORT    Name:  Lindsey Huynh, Lindsey Huynh  MR#:  [id number]  DOB:  06-26-1951  ACCOUNT #:  [id number]  DATE OF SERVICE:  11/25/2019      PREOPERATIVE DIAGNOSIS:  Peripheral vascular disease with rest pain and ulceration, left foot.    POSTOPERATIVE DIAGNOSIS:  Peripheral vascular disease with rest pain and ulceration, left foot.    PROCEDURE PERFORMED:  1.  Abdominal aortogram.  2.  Left leg runoff arteriogram with third order catheterization.    SURGEON:  Belenda Cruise, MD    ASSISTANT:  Jenny Reichmann.    ANESTHESIA:  Local with sedation.    COMPLICATIONS:  None.    SPECIMENS REMOVED:  None.    IMPLANTS:  Angio-Seal right groin.    ESTIMATED BLOOD LOSS:  Minimal.    INDICATIONS:  The patient is a 69 year old diabetic female who was admitted with left foot pain, absent pulses, and an ulceration on her left great toe.  She will undergo angiographic evaluation.    PROCEDURE:  The patient's right groin was prepped and draped.  The right common femoral artery was percutaneously cannulated and a 5-French sheath inserted over a guidewire.  A Glidewire and RIM catheter were used and the catheter advanced into the abdominal aorta.  An abdominal aortogram was done that showed no significant aortoiliac stenosis.  Using the  RIM catheter and a Glidewire, the aortic bifurcation was crossed and the left external iliac artery selectively catheterized.  A left leg arteriogram was performed to the level of the knee.  The 5-French sheath was exchanged for a long 6-French sheath which was positioned in the distal left external iliac artery.  The left superficial femoral artery was selectively catheterized.  The catheter and sheath were advanced into the superficial femoral artery.  Further runoff views from the knee and foot were obtained through a catheter and the superficial femoral artery.  There was occlusion of the distal superficial femoral artery and popliteal artery.  Attempts were made to  cross the occlusion using a catheter and Glidewire, but were unsuccessful.  The sheath was then pulled back into the right iliac artery in a right femoral arteriogram was performed.  The sheath was then removed over a guidewire and the femoral puncture site closed using an Angio-Seal device.  Dressings were applied.  The patient was transferred to the recovery area in stable condition.    FINDINGS:  1.  Aortoiliac arteriogram:  The abdominal aorta and iliac arteries are patent without narrowing.  2.  Left leg runoff arteriogram:  The common femoral, profunda femoris, and superficial femoral arteries are patent.  There is no significant stenosis in the superficial femoral artery.  The SFA is occluded at its distal portion at the junction with the popliteal artery.  The tibioperoneal trunk reconstitutes with two-vessel posterior tibial and peroneal outflow into the lower leg and foot.  The posterior tibial is a small diffusely narrow artery.  The peroneal artery is the best runoff vessel.    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  The patient will need bypass surgery for limb salvage.        Belenda Cruise, MD      [id number]  D:  11/25/2019 15:04  T:  11/25/2019 17:27  JOB #:  [id number]

## 2019-11-25 NOTE — Progress Notes (Cosign Needed)
Hospitalist Progress Note          Lindsey Donovan, NP  Please call operator and page for questions.  Call physician on-call through the operator 7pm-7am    Daily Progress Note: 11/26/2019    Primary care provider:Ivey, Gae Gallop, NP    Date of admission: 11/23/2019 12:32 PM    Admission Summary and Hospital Course:      From H&P 11/23/2019:  "Lindsey Huynh is a 69 y.o. female with past medical history of diabetes, insulin-dependent, GERD and hypertension comes for return of left foot pain and swelling.  Patient is awake, alert and oriented able to answer my question follow my request, data also obtained in the ED staff and extensive chart review.  As per collective reports, patient left great toe pain which associated darkening of the skin on the distal end of the toe and swelling of the left foot the medial aspect of the Of the last 1 week and has been progressively getting worse.  The only thing that he remembers was fungal nail cream.  She reported since her left second toe is in contact with the first toe and that pain he also became slightly cellulitic, she also has some dorsal swelling of the foot as well.  This all progress from being mildly achy to significant tender and swollen last 1 week and she came today because the pain was unbearable, 10/10, getting from her toe to her foot and made her ambulation significantly difficult.  Although she continues to have pain at rest.  She has not seek medical help for that until today, did not remember any trauma or injury to her feet.  She is hemodynamically otherwise stable in the ED on her own.  Patient was hemodynamically stable in the ED, and work revealed hypokalemia leukocytosis and x-ray the foot was without any acute abnormality.  Given concerns for dry gangrene and cellulitis, she was started on IV vancomycin.  Patient has no other medical complaints.  Patient will be admitted to hospitalist service for evaluation management."       Subjective:   F/U for left foot cellulitis, left great toe gangrene, DMII, HTN, GERD, Hypokalemia    Pt seen today in no acute distress. Will have flow studies today and possibly angiogram. Pain well controlled. No other complaints.     Assessment/Plan:       - Left foot pain, left great toe pain and swelling with dry gangrene, left 2nd toe cellulitic changes, POA  X ray foot WNL  Given pain and gangrene,  No trauma noted/reported  Elevated Sed rate and CRP  Follow BC x 2  Received a dose of Vanc. Pharmacy to dose Vanc.   Pain control  Seen by vascular surgery; plan for flow studies today  ??  IDDM2: hold metformin while IP  Cont basal insulin, SSI  Diabetic diet  a1c 8.1  ??  - HTN:  Controlled  Cont home meds   ??  - GERD  Cont PPI  ??  -Hypokalemia  Resolved    DVTppx: SCDs, lovenox  Gippx: Protonix  Code Status: Full code  Diet: Diabetic  Activity: OOB to chair TID and PRN  Discharge: Plan to discharge home pending progress and ultimate plan; ID, podiatry and vascular surg following           Review of Systems:     Full ROS complete with pertinent positives and negatives as per HPI, otherwise negative  Objective:   Physical Exam:  Visit Vitals  BP 129/74   Pulse 85   Temp 98.3 ??F (36.8 ??C)   Resp 16   Ht 5\' 5"  (1.651 m)   Wt 81.6 kg (180 lb)   SpO2 99%   BMI 29.95 kg/m??    O2 Flow Rate (L/min): 2 l/min O2 Device: Room air    Temp (24hrs), Avg:98 ??F (36.7 ??C), Min:97.8 ??F (36.6 ??C), Max:98.7 ??F (37.1 ??C)    No intake/output data recorded.   03/07 0701 - 03/08 1900  In: 500 [I.V.:500]  Out: 300 [Urine:300]      General:  Alert, cooperative, no distress, appears stated age.   Lungs:   Clear to auscultation bilaterally.   Heart:  Regular rate and rhythm, S1, S2 normal, no murmur, click, rub or Huynh.   Abdomen:   Soft, non-tender, non-distended. Bowel sounds normal.    Extremities: Extremities normal, atraumatic, no cyanosis. LLE 3+ edema   Skin: Skin color, texture, turgor normal. LLE medial large scabbed ulcer;  right great toe erythematous, swollen, small area dry gangrene noted   Neurologic: CNII-XII intact. MAE, A&Ox4     Data Review:       Recent Days:  Recent Labs     11/25/19  0458 11/24/19  0057 11/23/19  1332   WBC 7.5 8.3 8.1   HGB 11.4* 11.6 11.7   HCT 33.1* 33.2* 34.3*   PLT 378 406* 417*     Recent Labs     11/25/19  0458 11/24/19  0057 11/23/19  1332   NA 137 137 138   K 3.6 3.1* 3.2*   CL 107 103 104   CO2 26 27 30    GLU 70 95 170*   BUN 12 11 17    CREA 0.68 0.69 0.74   CA 10.2* 9.8 10.0   ALB  --   --  4.0   ALT  --   --  23     No results for input(s): PH, PCO2, PO2, HCO3, FIO2 in the last 72 hours.    24 Hour Results:  Recent Results (from the past 24 hour(s))   CBC WITH AUTOMATED DIFF    Collection Time: 11/25/19  4:58 AM   Result Value Ref Range    WBC 7.5 3.6 - 11.0 K/uL    RBC 3.66 (L) 3.80 - 5.20 M/uL    HGB 11.4 (L) 11.5 - 16.0 g/dL    HCT 33.1 (L) 35.0 - 47.0 %    MCV 90.4 80.0 - 99.0 FL    MCH 31.1 26.0 - 34.0 PG    MCHC 34.4 30.0 - 36.5 g/dL    RDW 13.0 11.5 - 14.5 %    PLATELET 378 150 - 400 K/uL    MPV 9.9 8.9 - 12.9 FL    NRBC 0.0 0 PER 100 WBC    ABSOLUTE NRBC 0.00 0.00 - 0.01 K/uL    NEUTROPHILS 60 32 - 75 %    LYMPHOCYTES 25 12 - 49 %    MONOCYTES 9 5 - 13 %    EOSINOPHILS 5 0 - 7 %    BASOPHILS 1 0 - 1 %    IMMATURE GRANULOCYTES 0 0.0 - 0.5 %    ABS. NEUTROPHILS 4.6 1.8 - 8.0 K/UL    ABS. LYMPHOCYTES 1.9 0.8 - 3.5 K/UL    ABS. MONOCYTES 0.7 0.0 - 1.0 K/UL    ABS. EOSINOPHILS 0.4 0.0 - 0.4 K/UL    ABS. BASOPHILS 0.1 0.0 - 0.1 K/UL  ABS. IMM. GRANS. 0.0 0.00 - 0.04 K/UL    DF AUTOMATED     METABOLIC PANEL, BASIC    Collection Time: 11/25/19  4:58 AM   Result Value Ref Range    Sodium 137 136 - 145 mmol/L    Potassium 3.6 3.5 - 5.1 mmol/L    Chloride 107 97 - 108 mmol/L    CO2 26 21 - 32 mmol/L    Anion gap 4 (L) 5 - 15 mmol/L    Glucose 70 65 - 100 mg/dL    BUN 12 6 - 20 MG/DL    Creatinine 0.68 0.55 - 1.02 MG/DL    BUN/Creatinine ratio 18 12 - 20      GFR est AA >60 >60 ml/min/1.66m2    GFR  est non-AA >60 >60 ml/min/1.1m2    Calcium 10.2 (H) 8.5 - 10.1 MG/DL   GLUCOSE, POC    Collection Time: 11/25/19  6:18 AM   Result Value Ref Range    Glucose (POC) 90 65 - 100 mg/dL    Performed by Weldon Inches    COVID-19 RAPID TEST    Collection Time: 11/25/19 10:25 AM   Result Value Ref Range    Specimen source Nasopharyngeal      COVID-19 rapid test Not detected NOTD     SARS-COV-2    Collection Time: 11/25/19 10:25 AM   Result Value Ref Range    SARS-CoV-2 Please find results under separate order     GLUCOSE, POC    Collection Time: 11/25/19 11:30 AM   Result Value Ref Range    Glucose (POC) 69 65 - 100 mg/dL    Performed by Sandi Raveling    GLUCOSE, POC    Collection Time: 11/25/19 12:02 PM   Result Value Ref Range    Glucose (POC) 109 (H) 65 - 100 mg/dL    Performed by Tonia Ghent    GLUCOSE, POC    Collection Time: 11/25/19 12:32 PM   Result Value Ref Range    Glucose (POC) 138 (H) 65 - 100 mg/dL    Performed by Vikki Ports    GLUCOSE, POC    Collection Time: 11/25/19  3:17 PM   Result Value Ref Range    Glucose (POC) 93 65 - 100 mg/dL    Performed by [place], POC    Collection Time: 11/25/19  4:56 PM   Result Value Ref Range    Glucose (POC) 89 65 - 100 mg/dL    Performed by Lozano, POC    Collection Time: 11/25/19 10:22 PM   Result Value Ref Range    Glucose (POC) 158 (H) 65 - 100 mg/dL    Performed by Malva Cogan        Problem List:  Problem List as of 11/26/2019 Date Reviewed: Jun 27, 2018          Codes Class Noted - Resolved    Gangrene ([place]) ICD-10-CM: [id number]  ICD-9-CM: 785.4  11/23/2019 - Present        Type 2 diabetes mellitus with diabetic neuropathy ([place]) ICD-10-CM: E11.40  ICD-9-CM: 250.60, 357.2  10/30/2018 - Present        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-10-CM: E11.21  ICD-9-CM: 250.40, 583.81  02/23/2017 - Present        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  ICD-9-CM: 365.9  06/03/2016 - Present        Cataract of left eye  ICD-10-CM: H26.9   ICD-9-CM: 366.9  06/03/2016 - Present        Postmenopausal ICD-10-CM: Z78.0  ICD-9-CM: V49.81  02/18/2014 - Present        Constipation ICD-10-CM: K59.00  ICD-9-CM: 564.00  01/03/2013 - Present        HTN (hypertension) ICD-10-CM: I10  ICD-9-CM: 401.9  12/11/2012 - Present        GERD (gastroesophageal reflux disease) ICD-10-CM: K21.9  ICD-9-CM: 530.81  12/11/2012 - Present        Hypercholesterolemia ICD-10-CM: E78.00  ICD-9-CM: 272.0  12/11/2012 - Present        Diabetes ([place]) ICD-10-CM: E11.9  ICD-9-CM: 250.00  12/11/2012 - Present              Medications reviewed  Current Facility-Administered Medications   Medication Dose Route Frequency   ??? Vancomycin, [place] - Please draw IMMEDIATELY PRIOR to starting 0400 dose on Tuesday, 3/09. Thanks!   Other ONCE   ??? Vancomycin- Pharmacy to Dose   Other Rx Dosing/Monitoring   ??? vancomycin (VANCOCIN) 1,000 mg in 0.9% sodium chloride 250 mL (VIAL-MATE)  1,000 mg IntraVENous Q12H   ??? gabapentin (NEURONTIN) capsule 300 mg  300 mg Oral QHS   ??? losartan (COZAAR) tablet 100 mg  100 mg Oral DAILY   ??? traMADoL (ULTRAM) tablet 50 mg  50 mg Oral Q4H PRN   ??? HYDROcodone-acetaminophen (NORCO) 5-325 mg per tablet 1 Tab  1 Tab Oral Q4H PRN   ??? morphine injection 1 mg  1 mg IntraVENous Q4H PRN   ??? docusate sodium (COLACE) capsule 100 mg  100 mg Oral BID   ??? senna (SENOKOT) tablet 8.6 mg  1 Tab Oral DAILY   ??? polyethylene glycol (MIRALAX) packet 17 g  17 g Oral DAILY PRN   ??? bisacodyL (DULCOLAX) suppository 10 mg  10 mg Rectal DAILY PRN   ??? hydroCHLOROthiazide (HYDRODIURIL) tablet 25 mg  25 mg Oral DAILY   ??? sodium chloride (NS) flush 5-40 mL  5-40 mL IntraVENous Q8H   ??? sodium chloride (NS) flush 5-40 mL  5-40 mL IntraVENous PRN   ??? acetaminophen (TYLENOL) tablet 650 mg  650 mg Oral Q6H PRN    Or   ??? acetaminophen (TYLENOL) suppository 650 mg  650 mg Rectal Q6H PRN   ??? polyethylene glycol (MIRALAX) packet 17 g  17 g Oral DAILY PRN   ??? promethazine (PHENERGAN) tablet 12.5 mg  12.5 mg Oral Q6H  PRN    Or   ??? ondansetron (ZOFRAN) injection 4 mg  4 mg IntraVENous Q6H PRN   ??? glucose chewable tablet 16 g  4 Tab Oral PRN   ??? dextrose (D50W) injection syrg 12.5-25 g  25-50 mL IntraVENous PRN   ??? glucagon (GLUCAGEN) injection 1 mg  1 mg IntraMUSCular PRN   ??? insulin glargine (LANTUS) injection 20 Units  20 Units SubCUTAneous QHS   ??? insulin lispro (HUMALOG) injection   SubCUTAneous AC&HS   ??? aspirin delayed-release tablet 81 mg  81 mg Oral DAILY   ??? pantoprazole (PROTONIX) tablet 40 mg  40 mg Oral ACB   ??? amLODIPine (NORVASC) tablet 10 mg  10 mg Oral DAILY   ??? enoxaparin (LOVENOX) injection 40 mg  40 mg SubCUTAneous Q24H       Care Plan discussed with: Patient/family, nurse      Lindsey Donovan, NP  Hospitalist  Providers can reach me on [employer]

## 2019-11-26 ENCOUNTER — Inpatient Hospital Stay: Admit: 2019-11-26 | Payer: Medicare (Managed Care) | Primary: Family

## 2019-11-26 ENCOUNTER — Other Ambulatory Visit: Payer: Self-pay | Admitting: Obstetrics and Gynecology

## 2019-11-26 DIAGNOSIS — Z1231 Encounter for screening mammogram for malignant neoplasm of breast: Secondary | ICD-10-CM

## 2019-11-26 LAB — VANCOMYCIN, TROUGH: Vancomycin,trough: 12 ug/mL — ABNORMAL HIGH (ref 5.0–10.0)

## 2019-11-26 LAB — ANKLE BRACHIAL INDEX
Left ABI: 0.34
Left anterior tibial: 55 mmHg
Left arm BP: 168 mmHg
Left posterior tibial: 57 mmHg
Right ABI: 0.45
Right anterior tibial: 66 mmHg
Right arm BP: 168 mmHg
Right posterior tibial: 76 mmHg

## 2019-11-26 LAB — GLUCOSE, POC
Glucose (POC): 158 mg/dL — ABNORMAL HIGH (ref 65–100)
Glucose (POC): 69 mg/dL (ref 65–100)
Glucose (POC): 98 mg/dL (ref 65–100)
Glucose (POC): 99 mg/dL (ref 65–100)
Glucose (POC): 165 mg/dL — ABNORMAL HIGH (ref 65–100)

## 2019-11-26 LAB — METABOLIC PANEL, BASIC
Anion gap: 6 mmol/L (ref 5–15)
BUN/Creatinine ratio: 15 (ref 12–20)
BUN: 12 MG/DL (ref 6–20)
CO2: 27 mmol/L (ref 21–32)
Calcium: 10.1 MG/DL (ref 8.5–10.1)
Chloride: 103 mmol/L (ref 97–108)
Creatinine: 0.79 MG/DL (ref 0.55–1.02)
GFR est AA: >60 mL/min/{1.73_m2} (ref >60)
GFR est non-AA: >60 mL/min/{1.73_m2} (ref >60)
Glucose: 76 mg/dL (ref 65–100)
Potassium: 3.2 mmol/L — ABNORMAL LOW (ref 3.5–5.1)
Sodium: 136 mmol/L (ref 136–145)

## 2019-11-26 LAB — CBC WITH AUTOMATED DIFF
ABS. BASOPHILS: 0 10*3/uL (ref 0.0–0.1)
ABS. EOSINOPHILS: 0.2 10*3/uL (ref 0.0–0.4)
ABS. IMM. GRANS.: 0 10*3/uL (ref 0.00–0.04)
ABS. LYMPHOCYTES: 1.5 10*3/uL (ref 0.8–3.5)
ABS. MONOCYTES: 0.6 10*3/uL (ref 0.0–1.0)
ABS. NEUTROPHILS: 5.2 10*3/uL (ref 1.8–8.0)
ABSOLUTE NRBC: 0 10*3/uL (ref 0.00–0.01)
BASOPHILS: 0 % (ref 0–1)
EOSINOPHILS: 3 % (ref 0–7)
HCT: 30.9 % — ABNORMAL LOW (ref 35.0–47.0)
HGB: 10.8 g/dL — ABNORMAL LOW (ref 11.5–16.0)
IMMATURE GRANULOCYTES: 0 % (ref 0.0–0.5)
LYMPHOCYTES: 20 % (ref 12–49)
MCH: 31.7 PG (ref 26.0–34.0)
MCHC: 35 g/dL (ref 30.0–36.5)
MCV: 90.6 FL (ref 80.0–99.0)
MONOCYTES: 7 % (ref 5–13)
MPV: 10.1 FL (ref 8.9–12.9)
NEUTROPHILS: 70 % (ref 32–75)
NRBC: 0 PER 100 WBC
PLATELET: 372 10*3/uL (ref 150–400)
RBC: 3.41 M/uL — ABNORMAL LOW (ref 3.80–5.20)
RDW: 13 % (ref 11.5–14.5)
WBC: 7.5 10*3/uL (ref 3.6–11.0)

## 2019-11-26 LAB — DUPLEX LOWER EXT VEIN MAPPING LEFT
Left GSV Ankle Diam: 0.34 cm
Left GSV BK Dist Diam: 0.34 cm
Left GSV BK Prox Diam: 0.25 cm
Left GSV Thigh Dist Diam: 0.36 cm
Left GSV Thigh Mid Diam: 0.36 cm
Left GSV Thigh Prox Diam: 0.78 cm
Left GSV at Knee Diam: 0.34 cm

## 2019-11-26 LAB — VAS ANKLE BRACHIAL INDEX (ABI)
Left ABI: 0.34
Left anterior tibial: 55 mmHg
Left arm BP: 168 mmHg
Left posterior tibial: 57 mmHg
Right ABI: 0.45
Right anterior tibial: 66 mmHg
Right arm BP: 168 mmHg
Right posterior tibial: 76 mmHg

## 2019-11-26 LAB — CBC WITH AUTO DIFFERENTIAL
Basophils %: 0 % (ref 0–1)
Basophils Absolute: 0 10*3/uL (ref 0.0–0.1)
Eosinophils %: 3 % (ref 0–7)
Eosinophils Absolute: 0.2 10*3/uL (ref 0.0–0.4)
Granulocyte Absolute Count: 0 10*3/uL (ref 0.00–0.04)
Hematocrit: 30.9 % — ABNORMAL LOW (ref 35.0–47.0)
Hemoglobin: 10.8 g/dL — ABNORMAL LOW (ref 11.5–16.0)
Immature Granulocytes %: 0 % (ref 0.0–0.5)
Lymphocytes %: 20 % (ref 12–49)
Lymphocytes Absolute: 1.5 10*3/uL (ref 0.8–3.5)
MCH: 31.7 PG (ref 26.0–34.0)
MCHC: 35 g/dL (ref 30.0–36.5)
MCV: 90.6 FL (ref 80.0–99.0)
MPV: 10.1 FL (ref 8.9–12.9)
Monocytes %: 7 % (ref 5–13)
Monocytes Absolute: 0.6 10*3/uL (ref 0.0–1.0)
NRBC Absolute: 0 10*3/uL (ref 0.00–0.01)
Neutrophils %: 70 % (ref 32–75)
Neutrophils Absolute: 5.2 10*3/uL (ref 1.8–8.0)
Nucleated RBCs: 0 PER 100 WBC
Platelets: 372 10*3/uL (ref 150–400)
RBC: 3.41 M/uL — ABNORMAL LOW (ref 3.80–5.20)
RDW: 13 % (ref 11.5–14.5)
WBC: 7.5 10*3/uL (ref 3.6–11.0)

## 2019-11-26 LAB — BASIC METABOLIC PANEL
Anion Gap: 6 mmol/L (ref 5–15)
BUN/Creatinine Ratio: 15 (ref 12–20)
BUN: 12 MG/DL (ref 6–20)
CO2: 27 mmol/L (ref 21–32)
Calcium: 10.1 MG/DL (ref 8.5–10.1)
Chloride: 103 mmol/L (ref 97–108)
Creatinine: 0.79 MG/DL (ref 0.55–1.02)
GFR African American: >60 mL/min/{1.73_m2} (ref >60)
Glucose: 76 mg/dL (ref 65–100)
Potassium: 3.2 mmol/L — ABNORMAL LOW (ref 3.5–5.1)
Sodium: 136 mmol/L (ref 136–145)
eGFR NON-AA: >60 mL/min/{1.73_m2} (ref >60)

## 2019-11-26 LAB — POCT GLUCOSE
POC Glucose: 158 mg/dL — ABNORMAL HIGH (ref 65–100)
POC Glucose: 69 mg/dL (ref 65–100)
POC Glucose: 98 mg/dL (ref 65–100)
POC Glucose: 99 mg/dL (ref 65–100)
POC Glucose: 165 mg/dL — ABNORMAL HIGH (ref 65–100)

## 2019-11-26 LAB — VAS VEIN MAPPING LOWER LEFT
Left GSV Ankle Diam: 0.34 cm
Left GSV BK Dist Diam: 0.34 cm
Left GSV BK Prox Diam: 0.25 cm
Left GSV Thigh Dist Diam: 0.36 cm
Left GSV Thigh Mid Diam: 0.36 cm
Left GSV Thigh Prox Diam: 0.78 cm
Left GSV at Knee Diam: 0.34 cm

## 2019-11-26 LAB — VANCOMYCIN TROUGH: Vancomycin Tr: 12 ug/mL — ABNORMAL HIGH (ref 5.0–10.0)

## 2019-11-26 MED FILL — VANCOMYCIN 1,000 MG IV SOLR: 1000 mg | INTRAVENOUS | Qty: 1000

## 2019-11-26 MED FILL — HYDROCODONE-ACETAMINOPHEN 5 MG-325 MG TAB: 5-325 mg | ORAL | Qty: 1

## 2019-11-26 MED FILL — SENNA LAX 8.6 MG TABLET: 8.6 mg | ORAL | Qty: 1

## 2019-11-26 MED FILL — HYDROCHLOROTHIAZIDE 25 MG TAB: 25 mg | ORAL | Qty: 1

## 2019-11-26 MED FILL — LOSARTAN 50 MG TAB: 50 mg | ORAL | Qty: 2

## 2019-11-26 MED FILL — AMLODIPINE 5 MG TAB: 5 mg | ORAL | Qty: 2

## 2019-11-26 MED FILL — PANTOPRAZOLE 40 MG TAB, DELAYED RELEASE: 40 mg | ORAL | Qty: 1

## 2019-11-26 MED FILL — INSULIN LISPRO 100 UNIT/ML INJECTION: 100 unit/mL | SUBCUTANEOUS | Qty: 1

## 2019-11-26 MED FILL — ENOXAPARIN 40 MG/0.4 ML SUB-Q SYRINGE: 40 mg/0.4 mL | SUBCUTANEOUS | Qty: 0.4

## 2019-11-26 MED FILL — ASPIRIN 81 MG TAB, DELAYED RELEASE: 81 mg | ORAL | Qty: 1

## 2019-11-26 MED FILL — GABAPENTIN 300 MG CAP: 300 mg | ORAL | Qty: 1

## 2019-11-26 MED FILL — INSULIN GLARGINE 100 UNIT/ML INJECTION: 100 unit/mL | SUBCUTANEOUS | Qty: 1

## 2019-11-26 MED FILL — DOK 100 MG CAPSULE: 100 mg | ORAL | Qty: 1

## 2019-11-26 NOTE — Progress Notes (Signed)
Bedside shift change report given to Morgan ,RN (oncoming nurse) by Devon Jones, RN(offgoing nurse).  Report given with SBAR.

## 2019-11-26 NOTE — Progress Notes (Cosign Needed)
Hospitalist Progress Note          [name], NP  Please call operator and page for questions.  Call physician on-call through the operator 7pm-7am    Daily Progress Note: 11/26/2019    Primary care provider:Ivey, Gae Gallop, NP    Date of admission: 11/23/2019 12:32 PM    Admission Summary and Hospital Course:      From H&P 11/23/2019:  "Lindsey Huynh is a 69 y.o. female with past medical history of diabetes, insulin-dependent, GERD and hypertension comes for return of left foot pain and swelling.  Patient is awake, alert and oriented able to answer my question follow my request, data also obtained in the ED staff and extensive chart review.  As per collective reports, patient left great toe pain which associated darkening of the skin on the distal end of the toe and swelling of the left foot the medial aspect of the Of the last 1 week and has been progressively getting worse.  The only thing that he remembers was fungal nail cream.  She reported since her left second toe is in contact with the first toe and that pain he also became slightly cellulitic, she also has some dorsal swelling of the foot as well.  This all progress from being mildly achy to significant tender and swollen last 1 week and she came today because the pain was unbearable, 10/10, getting from her toe to her foot and made her ambulation significantly difficult.  Although she continues to have pain at rest.  She has not seek medical help for that until today, did not remember any trauma or injury to her feet.  She is hemodynamically otherwise stable in the ED on her own.  Patient was hemodynamically stable in the ED, and work revealed hypokalemia leukocytosis and x-ray the foot was without any acute abnormality.  Given concerns for dry gangrene and cellulitis, she was started on IV vancomycin.  Patient has no other medical complaints.  Patient will be admitted to hospitalist service for evaluation management."     Subjective:   F/U for left foot cellulitis, left great toe gangrene, DMII, HTN, GERD, Hypokalemia    Pt seen today in no acute distress. She reports better pain control. No other complaints. Left popliteal occluded. Plans for bypass on Thurs.     Assessment/Plan:       - Left foot pain, left great toe pain and swelling with dry gangrene, left 2nd toe cellulitic changes, POA  X ray foot WNL  Given pain and gangrene,  No trauma noted/reported  Elevated Sed rate and CRP  Follow BC x 2  Received a dose of Vanc. Pharmacy to dose Vanc.   Pain control  Occluded left popliteal; plan for bypass thurs   ??  IDDM2: hold metformin while IP  Cont basal insulin, SSI  Diabetic diet  a1c 8.1  ??  - HTN:  Controlled  Cont home meds   ??  - GERD  Cont PPI  ??  -Hypokalemia  Resolved    DVTppx: SCDs, lovenox  Gippx: Protonix  Code Status: Full code  Diet: Diabetic  Activity: OOB to chair TID and PRN  Discharge: Plan to discharge home pending progress and ultimate plan; ID, podiatry and vascular surg following           Review of Systems:     Full ROS complete with pertinent positives and negatives as per HPI, otherwise negative  Objective:   Physical Exam:  Visit Vitals  BP (!) 147/81   Pulse 81   Temp 98.2 ??F (36.8 ??C)   Resp 16   Ht 5\' 5"  (1.651 m)   Wt 81.6 kg (180 lb)   SpO2 99%   BMI 29.95 kg/m??    O2 Flow Rate (L/min): 2 l/min O2 Device: Room air    Temp (24hrs), Avg:98.2 ??F (36.8 ??C), Min:97.8 ??F (36.6 ??C), Max:99 ??F (37.2 ??C)    No intake/output data recorded.   03/08 0701 - 03/09 1900  In: 500 [I.V.:500]  Out: 300 [Urine:300]      General:  Alert, cooperative, no distress, appears stated age.   Lungs:   Clear to auscultation bilaterally.   Heart:  Regular rate and rhythm, S1, S2 normal, no murmur, click, rub or gallop.   Abdomen:   Soft, non-tender, non-distended. Bowel sounds normal.    Extremities: Extremities normal, atraumatic, no cyanosis. LLE 3+ edema   Skin: Skin color, texture, turgor normal. LLE medial large scabbed  ulcer; right great toe erythematous, swollen, small area dry gangrene noted   Neurologic: CNII-XII intact. MAE, A&Ox4     Data Review:       Recent Days:  Recent Labs     11/26/19  0449 11/25/19  0458 11/24/19  0057   WBC 7.5 7.5 8.3   HGB 10.8* 11.4* 11.6   HCT 30.9* 33.1* 33.2*   PLT 372 378 406*     Recent Labs     11/26/19  0449 11/25/19  0458 11/24/19  0057   NA 136 137 137   K 3.2* 3.6 3.1*   CL 103 107 103   CO2 27 26 27    GLU 76 70 95   BUN 12 12 11    CREA 0.79 0.68 0.69   CA 10.1 10.2* 9.8     No results for input(s): PH, PCO2, PO2, HCO3, FIO2 in the last 72 hours.    24 Hour Results:  Recent Results (from the past 24 hour(s))   CBC WITH AUTOMATED DIFF    Collection Time: 11/26/19  4:49 AM   Result Value Ref Range    WBC 7.5 3.6 - 11.0 K/uL    RBC 3.41 (L) 3.80 - 5.20 M/uL    HGB 10.8 (L) 11.5 - 16.0 g/dL    HCT 30.9 (L) 35.0 - 47.0 %    MCV 90.6 80.0 - 99.0 FL    MCH 31.7 26.0 - 34.0 PG    MCHC 35.0 30.0 - 36.5 g/dL    RDW 13.0 11.5 - 14.5 %    PLATELET 372 150 - 400 K/uL    MPV 10.1 8.9 - 12.9 FL    NRBC 0.0 0 PER 100 WBC    ABSOLUTE NRBC 0.00 0.00 - 0.01 K/uL    NEUTROPHILS 70 32 - 75 %    LYMPHOCYTES 20 12 - 49 %    MONOCYTES 7 5 - 13 %    EOSINOPHILS 3 0 - 7 %    BASOPHILS 0 0 - 1 %    IMMATURE GRANULOCYTES 0 0.0 - 0.5 %    ABS. NEUTROPHILS 5.2 1.8 - 8.0 K/UL    ABS. LYMPHOCYTES 1.5 0.8 - 3.5 K/UL    ABS. MONOCYTES 0.6 0.0 - 1.0 K/UL    ABS. EOSINOPHILS 0.2 0.0 - 0.4 K/UL    ABS. BASOPHILS 0.0 0.0 - 0.1 K/UL    ABS. IMM. GRANS. 0.0 0.00 - 0.04 K/UL    DF AUTOMATED  METABOLIC PANEL, BASIC    Collection Time: 11/26/19  4:49 AM   Result Value Ref Range    Sodium 136 136 - 145 mmol/L    Potassium 3.2 (L) 3.5 - 5.1 mmol/L    Chloride 103 97 - 108 mmol/L    CO2 27 21 - 32 mmol/L    Anion gap 6 5 - 15 mmol/L    Glucose 76 65 - 100 mg/dL    BUN 12 6 - 20 MG/DL    Creatinine 0.79 0.55 - 1.02 MG/DL    BUN/Creatinine ratio 15 12 - 20      GFR est AA >60 >60 ml/min/1.97m2    GFR est non-AA >60 >60 ml/min/1.14m2     Calcium 10.1 8.5 - 10.1 MG/DL   VANCOMYCIN, TROUGH    Collection Time: 11/26/19  4:49 AM   Result Value Ref Range    Vancomycin,trough 12.0 (H) 5.0 - 10.0 ug/mL    Reported dose date NOT PROVIDED      Reported dose time: NOT PROVIDED      Reported dose: NOT PROVIDED UNITS   GLUCOSE, POC    Collection Time: 11/26/19  7:00 AM   Result Value Ref Range    Glucose (POC) 69 65 - 100 mg/dL    Performed by Malva Cogan    GLUCOSE, POC    Collection Time: 11/26/19  7:21 AM   Result Value Ref Range    Glucose (POC) 98 65 - 100 mg/dL    Performed by Malva Cogan    ANKLE BRACHIAL INDEX    Collection Time: 11/26/19  9:11 AM   Result Value Ref Range    Left anterior tibial 55 mmHg    Left posterior tibial 57 mmHg    Right anterior tibial 66 mmHg    Right posterior tibial 76 mmHg    Left arm BP 168 mmHg    Right arm BP 168 mmHg    Left ABI 0.34     Right ABI 0.45    DUPLEX LOWER EXT VEIN MAPPING LEFT    Collection Time: 11/26/19  9:12 AM   Result Value Ref Range    Left GSV Thigh Dist Diam 0.36 cm    Left GSV Thigh Mid Diam 0.36 cm    Left GSV Thigh Prox Diam 0.78 cm    Left GSV at Knee Diam 0.34 cm    Left GSV BK Prox Diam 0.25 cm    Left GSV Ankle Diam 0.34 cm    Left GSV BK Dist Diam 0.34 cm   GLUCOSE, POC    Collection Time: 11/26/19 11:33 AM   Result Value Ref Range    Glucose (POC) 99 65 - 100 mg/dL    Performed by Tonia Ghent    GLUCOSE, POC    Collection Time: 11/26/19  4:20 PM   Result Value Ref Range    Glucose (POC) 165 (H) 65 - 100 mg/dL    Performed by Sandi Raveling    GLUCOSE, POC    Collection Time: 11/26/19  9:22 PM   Result Value Ref Range    Glucose (POC) 99 65 - 100 mg/dL    Performed by Norlene Duel        Problem List:  Problem List as of 11/26/2019 Date Reviewed: June 24, 2018          Codes Class Noted - Resolved    Gangrene ([place]) ICD-10-CM: [id number]  ICD-9-CM: 785.4  11/23/2019 - Present  Type 2 diabetes mellitus with diabetic neuropathy (HCC) ICD-10-CM: E11.40  ICD-9-CM: 250.60, 357.2  10/30/2018 - Present         Type 2 diabetes with nephropathy ([place]) ICD-10-CM: E11.21  ICD-9-CM: 250.40, 583.81  02/23/2017 - Present        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  ICD-9-CM: 365.9  06/03/2016 - Present        Cataract of left eye ICD-10-CM: H26.9  ICD-9-CM: 366.9  06/03/2016 - Present        Postmenopausal ICD-10-CM: Z78.0  ICD-9-CM: V49.81  02/18/2014 - Present        Constipation ICD-10-CM: K59.00  ICD-9-CM: 564.00  01/03/2013 - Present        HTN (hypertension) ICD-10-CM: I10  ICD-9-CM: 401.9  12/11/2012 - Present        GERD (gastroesophageal reflux disease) ICD-10-CM: K21.9  ICD-9-CM: 530.81  12/11/2012 - Present        Hypercholesterolemia ICD-10-CM: E78.00  ICD-9-CM: 272.0  12/11/2012 - Present        Diabetes ([place]) ICD-10-CM: E11.9  ICD-9-CM: 250.00  12/11/2012 - Present              Medications reviewed  Current Facility-Administered Medications   Medication Dose Route Frequency   ??? [START ON 11/27/2019] insulin glargine (LANTUS) injection 16 Units  16 Units SubCUTAneous QHS   ??? Vancomycin- Pharmacy to Dose   Other Rx Dosing/Monitoring   ??? vancomycin (VANCOCIN) 1,000 mg in 0.9% sodium chloride 250 mL (VIAL-MATE)  1,000 mg IntraVENous Q12H   ??? gabapentin (NEURONTIN) capsule 300 mg  300 mg Oral QHS   ??? losartan (COZAAR) tablet 100 mg  100 mg Oral DAILY   ??? traMADoL (ULTRAM) tablet 50 mg  50 mg Oral Q4H PRN   ??? HYDROcodone-acetaminophen (NORCO) 5-325 mg per tablet 1 Tab  1 Tab Oral Q4H PRN   ??? morphine injection 1 mg  1 mg IntraVENous Q4H PRN   ??? docusate sodium (COLACE) capsule 100 mg  100 mg Oral BID   ??? senna (SENOKOT) tablet 8.6 mg  1 Tab Oral DAILY   ??? polyethylene glycol (MIRALAX) packet 17 g  17 g Oral DAILY PRN   ??? bisacodyL (DULCOLAX) suppository 10 mg  10 mg Rectal DAILY PRN   ??? hydroCHLOROthiazide (HYDRODIURIL) tablet 25 mg  25 mg Oral DAILY   ??? sodium chloride (NS) flush 5-40 mL  5-40 mL IntraVENous Q8H   ??? sodium chloride (NS) flush 5-40 mL  5-40 mL IntraVENous PRN   ??? acetaminophen (TYLENOL) tablet 650 mg  650 mg Oral  Q6H PRN    Or   ??? acetaminophen (TYLENOL) suppository 650 mg  650 mg Rectal Q6H PRN   ??? polyethylene glycol (MIRALAX) packet 17 g  17 g Oral DAILY PRN   ??? promethazine (PHENERGAN) tablet 12.5 mg  12.5 mg Oral Q6H PRN    Or   ??? ondansetron (ZOFRAN) injection 4 mg  4 mg IntraVENous Q6H PRN   ??? glucose chewable tablet 16 g  4 Tab Oral PRN   ??? dextrose (D50W) injection syrg 12.5-25 g  25-50 mL IntraVENous PRN   ??? glucagon (GLUCAGEN) injection 1 mg  1 mg IntraMUSCular PRN   ??? insulin lispro (HUMALOG) injection   SubCUTAneous AC&HS   ??? aspirin delayed-release tablet 81 mg  81 mg Oral DAILY   ??? pantoprazole (PROTONIX) tablet 40 mg  40 mg Oral ACB   ??? amLODIPine (NORVASC) tablet 10 mg  10 mg Oral DAILY   ???  enoxaparin (LOVENOX) injection 40 mg  40 mg SubCUTAneous Q24H       Care Plan discussed with: Patient/family, nurse      [name], NP  Hospitalist  Providers can reach me on [employer]

## 2019-11-26 NOTE — Progress Notes (Signed)
Pharmacist Note - Vancomycin Dosing  Therapy day 4  Indication: SSTI- left great toe pain and swelling with dry gangrene, left 2nd toe cellulitis  Current regimen: 1000 mg IV q12h    Recent Labs     11/26/19  0449 11/25/19  0458 11/24/19  0057   WBC 7.5 7.5 8.3   CREA 0.79 0.68 0.69   BUN 12 12 11        A Trough Level resulted at 12.0 mcg/mL which was obtained 12 hrs post-dose.      Goal trough: 10 - 15 mcg/mL     Plan: Continue current regimen. Pharmacy will continue to monitor this patient daily for changes in clinical status and renal function.

## 2019-11-26 NOTE — Progress Notes (Signed)
Vascular:    Angio yesterday showed left popliteal occlusion. Plan left leg bypass on Thursday.

## 2019-11-26 NOTE — Progress Notes (Signed)
Bedside and Verbal shift change report given to [place], Therapist, sports (Soil scientist) by Lilia Pro, RN (offgoing nurse). Report included the following information SBAR.

## 2019-11-26 NOTE — Progress Notes (Signed)
Problem: Mobility Impaired (Adult and Pediatric)  Goal: *Acute Goals and Plan of Care (Insert Text)  Description: FUNCTIONAL STATUS PRIOR TO ADMISSION: Patient was independent and active without use of DME.    HOME SUPPORT PRIOR TO ADMISSION: The patient lived alone with no local support.    Physical Therapy Goals  Initiated 11/26/2019  1.  Patient will move from supine to sit and sit to supine , scoot up and down, and roll side to side in bed with supervision/set-up within 7 day(s).    2.  Patient will transfer from bed to chair and chair to bed with supervision/set-up using the least restrictive device within 7 day(s).  3.  Patient will perform sit to stand with supervision/set-up within 7 day(s).  4.  Patient will ambulate with supervision/set-up for 50 feet with the least restrictive device within 7 day(s).   5.  Patient will ascend/descend 4 stairs with cane and one handrail(s) with supervision/set-up within 7 day(s).     Outcome: Progressing Towards Goal   PHYSICAL THERAPY EVALUATION  Patient: Skylee Helmke [id number] y.o. female)  Date: 11/26/2019  Primary Diagnosis: Gangrene ([place]) [I96]  Procedure(s) (LRB):  LEFT LEG ARTERIOGRAM (Left) 1 Day Post-Op   Precautions:  Fall    ASSESSMENT  Based on the objective data described below, the patient presents with  impairment in functional mobility, activity tolerance and balance s/p gangrene in left toes and need for revascularization. PLOF: Independent with ADLs and IADLs. Patient lives alone in a one story home with 5 steps and rail to enter. Daughter lives in [place] and works full-time. Patient is scheduled for surgery Thursday and goals will have to be reassessed afterward as weight bearing status may change. Patient has no DME at home, just purchased a single point cane.    Current Level of Function Impacting Discharge (mobility/balance): Patient performed bed mobility and transfers with supervision and set up. Tried to ambulate with cane, but could only take  very tiny steps, so switched patient to RW for more support and more efficient gait. Gait is extremely antalgic due to pain in foot. Patient complains of heels burning, so educated her on/used blanket roll and pillow to float heels. Started patient on supine exercises (see Therapeutic Exercise below) and wrote on [employer]. Patient to perform 10/hr.      Functional Outcome Measure:  The patient scored 55/100 on the Barthel outcome measure which is indicative of moderate impaired ability to care for basic self-needs/dependency on others.      Other factors to consider for discharge: Lives alone/Motivated/A & O x 4/Independent PLOF      Patient will benefit from skilled therapy intervention to address the above noted impairments.       PLAN :  Recommendations and Planned Interventions: bed mobility training, transfer training, gait training, therapeutic exercises, patient and family training/education, and therapeutic activities      Frequency/Duration: Patient will be followed by physical therapy:  4 times a week to address goals.    Recommendation for discharge: (in order for the patient to meet his/her long term goals)  Therapy 3 hours per day 5-7 days per week    This discharge recommendation:  A follow-up discussion with the attending provider and/or case management is planned    IF patient discharges home will need the following DME: bedside commode and rolling walker         SUBJECTIVE:   Patient stated ???I am having surgery Thursday. I am so used to being  independent, I don't like to have to rely on other people.???    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       Personal factors  and/or comorbidities impacting plan of care: Motivated/A & O x 4/Lives alone/Independent PLOF     Home Situation  Home Environment: Private residence  # Steps to Enter: 4  Rails to Enter: Yes  Hand Rails : Left  Wheelchair Ramp: No  One/Two Story Residence: One story  Living Alone: Yes  Support Systems: Child(ren)(daughter in [place])  Patient Expects to be Discharged to:: Rehabilitation facility  Current DME Used/Available at Home: Cane, straight  Tub or Shower Type: Tub/Shower combination    EXAMINATION/PRESENTATION/DECISION MAKING:   Critical Behavior:  Neurologic State: Alert  Orientation Level: Oriented X4  Cognition: Appropriate decision making, Appropriate for age attention/concentration, Appropriate safety awareness, Follows commands       Range Of Motion:  AROM: Generally decreased, functional           PROM: Generally decreased, functional           Strength:    Strength: Generally decreased, functional                    Tone & Sensation:   Tone: Normal              Sensation: Impaired               Coordination:  Coordination: Within functional limits  Vision:      Functional Mobility:  Bed Mobility:     Supine to Sit: Supervision;Setup  Sit to Supine: Supervision;Setup     Transfers:  Sit to Stand: Supervision  Stand to Sit: Supervision                       Balance:   Sitting: Intact  Standing: Impaired;Without support  Standing - Static: Good;Constant support  Standing - Dynamic : Fair;Constant support  Ambulation/Gait Training:  Distance (ft): 20 Feet (ft)  Assistive Device: Walker, rolling;Gait belt  Ambulation - Level of Assistance: Contact guard assistance        Gait Abnormalities: Antalgic;Decreased step clearance;Step to gait        Base of Support: Widened;Shift to right  Stance: Left decreased(weight on heel)  Speed/Cadence: Pace decreased (<100 feet/min);Shuffled  Step Length: Right shortened;Left shortened  Swing Pattern: Left asymmetrical     Interventions: Safety awareness  training;Verbal cues           Therapeutic Exercises:   Initiated LE strength exercises and wrote same on [employer] to be performed 10x each, every hour:    Ankle Pumps  Quad Sets  Glute Sets  SLRs  Hip abd=add "scissors:      Functional Measure:  Barthel Index:    Bathing: 0  Bladder: 10  Bowels: 10  Grooming: 5  Dressing: 5  Feeding: 10  Mobility: 0  Stairs: 0  Toilet Use: 5  Transfer (Bed to Chair and Back): 10  Total: 55/100       The Barthel ADL Index: Guidelines  1. The index should be used as a record of what a patient does, not as a record of what a patient could do.  2. The main aim is to establish degree of independence from any help, physical or verbal, however minor and for whatever reason.  3. The need for supervision renders the patient not independent.  4. A patient's performance should be established using the best available evidence. Asking the patient, friends/relatives and nurses are the usual sources, but direct observation and common sense are also important. However direct testing is not needed.  5. Usually the patient's performance over the preceding 24-48 hours is important, but occasionally longer periods will be relevant.  6. Middle categories imply that the patient supplies over 50 per cent of the effort.  7. Use of aids to be independent is allowed.    Daneen Schick., Barthel, D.W. [phone]). Functional evaluation: the Barthel Index. [place] (14)2.  Lucianne Lei der [place], J.J.M.F, [place], Diona Browner., Oris Drone., [place], [place] (1999). Measuring the change indisability after inpatient rehabilitation; comparison of the responsiveness of the Barthel Index and Functional Independence Measure. Journal of Neurology, Neurosurgery, and Psychiatry, 66(4), [phone].  [place] Sports, N.J.A, [place],  W.J.M, & [place], M.A. (2004.) Assessment of post-stroke quality of life in cost-effectiveness studies: The usefulness of the Barthel Index and the EuroQoL-5D. Quality of Life Research,  40, [phone]           Physical Therapy Evaluation Charge Determination   History Examination Presentation Decision-Making   LOW Complexity : Zero comorbidities / personal factors that will impact the outcome / POC LOW Complexity : 1-2 Standardized tests and measures addressing body structure, function, activity limitation and / or participation in recreation  LOW Complexity : Stable, uncomplicated  LOW Complexity : FOTO score of 75-100      Based on the above components, the patient evaluation is determined to be of the following complexity level: LOW     Pain Rating:  7/10 left     Activity Tolerance:   Good    After treatment patient left in no apparent distress:   Supine in bed, Heels elevated for pressure relief, Call bell within reach, Side rails x 3, and nurse notified.    COMMUNICATION/EDUCATION:   The patient???s plan of care was discussed with: Registered nurse.     Fall prevention education was provided and the patient/caregiver indicated understanding., Patient/family have participated as able in goal setting and plan of care., and Patient/family agree to work toward stated goals and plan of care.    Thank you for this referral.  Hilda Lias   Time Calculation: 30 mins

## 2019-11-26 NOTE — Progress Notes (Signed)
 Problem: Mobility Impaired (Adult and Pediatric)  Goal: *Acute Goals and Plan of Care (Insert Text)  Description: FUNCTIONAL STATUS PRIOR TO ADMISSION: Patient was independent and active without use of DME.    HOME SUPPORT PRIOR TO ADMISSION: The patient lived alone with no local support.    Physical Therapy Goals  Initiated 11/26/2019  1.  Patient will move from supine to sit and sit to supine , scoot up and down, and roll side to side in bed with supervision/set-up within 7 day(s).    2.  Patient will transfer from bed to chair and chair to bed with supervision/set-up using the least restrictive device within 7 day(s).  3.  Patient will perform sit to stand with supervision/set-up within 7 day(s).  4.  Patient will ambulate with supervision/set-up for 50 feet with the least restrictive device within 7 day(s).   5.  Patient will ascend/descend 4 stairs with cane and one handrail(s) with supervision/set-up within 7 day(s).     Outcome: Progressing Towards Goal   PHYSICAL THERAPY EVALUATION  Patient: Lindsey Huynh (69 y.o. 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 PLOF: Independent with ADLs and IADLs. Patient lives alone in a one story home with 5 steps and rail to enter. Daughter lives in [place] and works full-time. Patient is scheduled for surgery Thursday and goals will have to be reassessed afterward as weight bearing status may change. Patient has no DME at home, just purchased a single point cane.    Current Level of Function Impacting Discharge (mobility/balance): Patient performed bed mobility and transfers with supervision and set up. Tried to ambulate with cane, but could only take  very tiny steps, so switched patient to RW for more support and more efficient gait. Gait is extremely antalgic due to pain in foot. Patient complains of heels burning, so educated her on/used blanket roll and pillow to float heels. Started patient on supine exercises (see Therapeutic Exercise below) and wrote on [employer]. Patient to perform 10/hr.      Functional Outcome Measure:  The patient scored 55/100 on the Barthel outcome measure which is indicative of moderate impaired ability to care for basic self-needs/dependency on others.      Other factors to consider for discharge: Lives alone/Motivated/A & O x 4/Independent PLOF      Patient will benefit from skilled therapy intervention to address the above noted impairments.       PLAN :  Recommendations and Planned Interventions: bed mobility training, transfer training, gait training, therapeutic exercises, patient and family training/education, and therapeutic activities      Frequency/Duration: Patient will be followed by physical therapy:  4 times a week to address goals.    Recommendation for discharge: (in order for the patient to meet his/her long term goals)  Therapy 3 hours per day 5-7 days per week    This discharge recommendation:  A follow-up discussion with the attending provider and/or case management is planned    IF patient discharges home will need the following DME: bedside commode and rolling walker         SUBJECTIVE:   Patient stated "I am having surgery Thursday. 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       Personal factors  and/or comorbidities impacting plan of care: Motivated/A & O x 4/Lives alone/Independent PLOF     Home Situation  Home Environment: Private residence  # Steps to Enter: 4  Rails to Enter: Yes  Hand Rails : Left  Wheelchair Ramp: No  One/Two Story Residence: One story  Living Alone: Yes  Support Systems: Child(ren)(daughter in [place])  Patient Expects to be Discharged to:: Rehabilitation facility  Current DME Used/Available at Home: Cane, straight  Tub or Shower Type: Tub/Shower combination    EXAMINATION/PRESENTATION/DECISION MAKING:   Critical Behavior:  Neurologic State: Alert  Orientation Level: Oriented X4  Cognition: Appropriate decision making, Appropriate for age attention/concentration, Appropriate safety awareness, Follows commands       Range Of Motion:  AROM: Generally decreased, functional           PROM: Generally decreased, functional           Strength:    Strength: Generally decreased, functional                    Tone & Sensation:   Tone: Normal              Sensation: Impaired               Coordination:  Coordination: Within functional limits  Vision:      Functional Mobility:  Bed Mobility:     Supine to Sit: Supervision;Setup  Sit to Supine: Supervision;Setup     Transfers:  Sit to Stand: Supervision  Stand to Sit: Supervision                       Balance:   Sitting: Intact  Standing: Impaired;Without support  Standing - Static: Good;Constant support  Standing - Dynamic : Fair;Constant support  Ambulation/Gait Training:  Distance (ft): 20 Feet (ft)  Assistive Device: Walker, rolling;Gait belt  Ambulation - Level of Assistance: Contact guard assistance        Gait Abnormalities: Antalgic;Decreased step clearance;Step to gait        Base of Support: Widened;Shift to right  Stance: Left decreased(weight on heel)  Speed/Cadence: Pace decreased (<100 feet/min);Shuffled  Step Length: Right shortened;Left shortened  Swing Pattern: Left asymmetrical     Interventions: Safety awareness  training;Verbal cues           Therapeutic Exercises:   Initiated LE strength exercises and wrote same on [employer] to be performed 10x each, every hour:    Ankle Pumps  Quad Sets  Glute Sets  SLRs  Hip abd=add scissors:      Functional Measure:  Barthel Index:    Bathing: 0  Bladder: 10  Bowels: 10  Grooming: 5  Dressing: 5  Feeding: 10  Mobility: 0  Stairs: 0  Toilet Use: 5  Transfer (Bed to Chair and Back): 10  Total: 55/100       The Barthel ADL Index: Guidelines  1. The index should be used as a record of what a patient does, not as a record of what a patient could do.  2. The main aim is to establish degree of independence from any help, physical or verbal, however minor and for whatever reason.  3. The need for supervision renders the patient not independent.  4. A patient's performance should be established using the best available evidence. Asking the patient, friends/relatives and nurses are the usual sources, but direct observation and common sense are also important. However direct testing is not needed.  5. Usually the patient's performance over the preceding 24-48 hours is important, but occasionally longer periods will be relevant.  6. Middle categories imply that the patient supplies over 50 per cent of the effort.  7. Use of aids to be independent is allowed.    Henriette Desanctis., Barthel, D.W. [id number]). Functional evaluation: the Barthel Index. Md State Med J (14)2.  Fleeta der Taylor, J.J.M.F, [place], DAIVD., Oneita [place]., [place], [place]. (1999). Measuring the change indisability after inpatient rehabilitation; comparison of the responsiveness of the Barthel Index and Functional Independence Measure. Journal of Neurology, Neurosurgery, and Psychiatry, 66(4), [phone].  Fleeta Chillington, N.J.A, Scholte op [place],  W.J.M, & Koopmanschap, M.A. (2004.) Assessment of post-stroke quality of life in cost-effectiveness studies: The usefulness of the Barthel Index and the EuroQoL-5D. Quality of Life Research,  54, 572-56           Physical Therapy Evaluation Charge Determination   History Examination Presentation Decision-Making   LOW Complexity : Zero comorbidities / personal factors that will impact the outcome / POC LOW Complexity : 1-2 Standardized tests and measures addressing body structure, function, activity limitation and / or participation in recreation  LOW Complexity : Stable, uncomplicated  LOW Complexity : FOTO score of 75-100      Based on the above components, the patient evaluation is determined to be of the following complexity level: LOW     Pain Rating:  7/10 left     Activity Tolerance:   Good    After treatment patient left in no apparent distress:   Supine in bed, Heels elevated for pressure relief, Call bell within reach, Side rails x 3, and nurse notified.    COMMUNICATION/EDUCATION:   The patient's plan of care was discussed with: Registered nurse.     Fall prevention education was provided and the patient/caregiver indicated understanding., Patient/family have participated as able in goal setting and plan of care., and Patient/family agree to work toward stated goals and plan of care.    Thank you for this referral.  Leita ONEIDA Silvan   Time Calculation: 30 mins

## 2019-11-26 NOTE — Group Note (Signed)
 Diabetes Mgmt by Moises Rosaline LABOR, CNS at 11/26/19 1119                Author: Moises Rosaline LABOR, CNS  Service: Certified Clinical Nurse Specialist  Author Type: Clinical Nurse Specialist       Filed: 11/26/19 1124  Date of Service: 11/26/19 1119  Status: Signed          Editor: Moises Rosaline LABOR, CNS (Clinical Nurse Specialist)               [place]   [facility]      CLINICAL NURSE SPECIALIST CONSULT       FOLLOW UP  NOTE     Initial Presentation     Lindsey Huynh is a 69 y.o.  female admitted over weekend with left foot pain . Left great toe gangrenous.   Initial work up and evaluation reveals NO osteomyelitits.        HX:      Past Medical History:        Diagnosis  Date         ?  Diabetes (HCC)       ?  GERD (gastroesophageal reflux disease)       ?  Hypercholesterolemia           ?  Hypertension              DX: CT-no osteo/ Xray-unremarkable      TX: Vascular surgery consulted- left leg medially with healing ulcerations consistent with venous ulcer. Plan for flow studies today.         Hospital course     Clinical progress has been complicated by venous insufficiency - BG trends mostly within goal during admission.     Diabetes      Patient has known Type 2 diabetes, treated with Levemir and Metformin   PTA.       Admission BG 170 and A1c 8.15 indicate more acceptable diabetes control. Previous A1C 03/2019 11.0% . Highest A1C Feb. 2020-12.1%.    Ambulatory blood glucose management provided by primary care provider-Last seen 11/05/2019 virtual visit   Consulted by Provider for advanced diabetes nursing assessment and care, specifically related  to    []  Transitioning off Glucostabilizer    []  Inpatient management strategy   [x]  Home management assessment   []  Survival skill education      Diabetes-related medical history   Acute complications   NONE   Neurological complications   Peripheral neuropathy   Microvascular disease   NONE   Macrovascular disease   Peripheral  vascular disease and Foot wounds   Other associated conditions      HTN/hypercholesteremia/      Diabetes medication history        Drug class  Currently in use  Discontinued  Never used          Biguanide  Metformin  1000mg  daily in divided doses              DDP-4 inhibitor                 Sulfonylurea                Thiazolidinedione                GLP-1 RA                SGLT-2 inhibitors  Basal insulin   Levemir 20units daily              Bolus insulin                 Fixed Dose  Combinations                Subjective     Slated for vascular surgery-bypass Thursday per notes.      Patient reports the following home diabetes self-care practices:  Eating pattern-cut out sugar, made some diet changes, with carb intake, especially breads.       Physical activity pattern-desires to be physically active, but recent pain in left leg has her unable to do any physical activity      Monitoring pattern-checks every other day before and after meals.        Taking medications pattern   [x]  Consistent administration   [x]  Affordable      Social determinants of health impacting diabetes self-management practices    Concerned that you need to know more about how to stay healthy with diabetes         Objective     Physical exam   General Alert, oriented and in no acute distress. Conversant and cooperative.    Vital Signs    Visit Vitals      BP  (!) 147/84 (BP 1 Location: Left upper arm, BP Patient Position: At rest)     Pulse  79     Temp  99 F (37.2 C)     Resp  15     Ht  5' 5 (1.651 m)     Wt  81.6 kg (180 lb)     SpO2  96%        BMI  29.95 kg/m        Skin  Warm and dry.     Heart   Regular rate and rhythm. No murmurs, rubs or gallops   Lungs  Clear to auscultation without rales or rhonchi   Extremities      Diabetic foot exam:     Left Foot-=+2 edema noted left lower leg       Visual Exam: ulcer- left great toe under nail and 2nd toe-black eschar noted    Pulse DP: 1+ (weak)    Filament test: reduced  sensation        Right Foot    Visual Exam: normal     Pulse DP: 2+ (normal)    Filament test: reduced sensation      DP & PT pulses +2.       Laboratory   BMP:      Lab Results         Component  Value  Date/Time            NA  136  11/26/2019 04:49 AM       K  3.2 (L)  11/26/2019 04:49 AM       CL  103  11/26/2019 04:49 AM       CO2  27  11/26/2019 04:49 AM       AGAP  6  11/26/2019 04:49 AM       GLU  76  11/26/2019 04:49 AM       BUN  12  11/26/2019 04:49 AM       CREA  0.79  11/26/2019 04:49 AM       GFRAA  >60  11/26/2019 04:49 AM  GFRNA  >60  11/26/2019 04:49 AM         Factors impacting BG management       Factor  Dose  Comments         Nutrition:   Carb-controlled meals        60 grams/meal          Pain  Left foot           Infection             Other: venous insuffiencey   Vascular surgery following  Flow studies pending        Blood glucose pattern                Assessment and Plan        Nursing Diagnosis  Risk for unstable blood glucose pattern     Nursing Intervention Domain  5250 Decision-making Support        Nursing Interventions  Examined current inpatient diabetes control    Explored factors facilitating and impeding inpatient management   Identified self-management practices impeding diabetes control   Explored corrective strategies with patient and responsible inpatient provider    Informed patient of rational for insulin  strategy while hospitalized      Instructed patient in importance of checking blood glucose DAILY before meals - discussed when she should check her BG after meals-2hours          Evaluation     [name], with Type 2 diabetes, did not achieve diabetes control prior to admission, as evidenced by admission BG of 170 and A1c of 81%.  She is followed closely by her PCP  for continued diabetes management.  She last saw PCP via virtual visit in Feb. 2021. Her A1C is improved since July 2020 when it was >11%. She is concerned about the fate of her left foot, but is  encouraged that her A1C has lowered significantly since  July 2020.        Noted hypoglycemia yesterday and this AM.  Would recommend decreasing basal insulin  to decrease risk of hypoglycemia     Recommendations        [x]  Use of  Subcutaneous Insulin  Order set [id number])       Insulin   Dosing  Specific recommendation         ADJUST  Basal                                      (Based on weight, BMI & GFR)  16 units daily (20% reduction)  On 11/27/2019- REDUCE  basal insulin  to 10units since will be NPO for surgery on 11/28/2019         CONTINUE Corrective                                       (Useful in adjusting insulin  dosing)  [x]   Normal sensitivity                     Billing Code(s)     [x]  99231 IP subsequent hospital  care - 15 minutes      Before making these care recommendations, I personally reviewed the hospitalization record, including notes, laboratory & diagnostic data and current medications, and    examined the patient at the bedside (circumstances  permitting) before making care recommendations.      Total minutes: 15      Rosaline DELENA Bangs, CNS   Diabetes Clinical Nurse Specialist   [facility]   Access via Perfect [employer]

## 2019-11-26 NOTE — Progress Notes (Deleted)
Progress Notes by Orlinda Blalock, NP at 11/26/19 1318                Author: Orlinda Blalock, NP  Service: Nurse Practitioner  Author Type: Nurse Practitioner       Filed: 11/26/19 2346  Date of Service: 11/26/19 1318  Status: Cosign Needed           Editor: Orlinda Blalock, NP (Nurse Practitioner)  Cosign Required: Yes                                                 Hospitalist Progress Note           [name], NP   Please call operator and page for questions.   Call physician on-call through the operator 7pm-7am      Daily Progress Note: 11/26/2019      Primary care provider:Ivey, Gae Gallop, NP      Date of admission: 11/23/2019 12:32 PM      Admission Summary and Hospital Course:        From H&P 11/23/2019:   "Lindsey Huynh is a 69 y.o. female with past medical history of diabetes, insulin-dependent, GERD and hypertension comes for return of left foot pain and swelling.   Patient is awake, alert and oriented able to answer my question follow my request, data also obtained in the ED staff and extensive chart review.  As per collective  reports, patient left great toe pain which associated darkening of the skin on the distal end of the toe and swelling of the left foot the  medial aspect of the Of the last 1 week and has been progressively getting worse.  The only thing that he remembers was fungal nail cream.  She reported since her left second toe is in contact with the first toe and that pain he also became slightly  cellulitic, she also has some dorsal swelling of the foot as well.  This all progress from being mildly achy to significant tender and swollen last 1 week and she came today because the pain was unbearable, 10/10, getting from her toe to her foot and  made her ambulation significantly difficult.  Although she continues to have pain at rest.  She has not seek medical help for that until today, did not remember any trauma or injury to her feet.  She is hemodynamically otherwise  stable in the ED on  her own.  Patient was hemodynamically stable in the ED, and work revealed hypokalemia leukocytosis and x-ray the foot was without any acute abnormality.  Given concerns for dry gangrene and cellulitis, she was started on IV vancomycin.  Patient has  no other medical complaints.  Patient will be admitted to hospitalist service for evaluation management."        Subjective:     F/U for left foot cellulitis, left great toe gangrene, DMII, HTN, GERD, Hypokalemia      Pt seen today in no acute distress. She reports better pain control. No other complaints. Left popliteal occluded. Plans for bypass on Thurs.         Assessment/Plan:             - Left foot pain, left great toe pain and swelling with dry gangrene, left 2nd toe cellulitic changes, POA   X ray foot WNL  Given pain and gangrene,   No trauma noted/reported   Elevated Sed rate and CRP   Follow BC x 2   Received a dose of Vanc. Pharmacy to dose Vanc.    Pain control   Occluded left popliteal; plan for bypass thurs       IDDM2: hold metformin while IP   Cont basal insulin, SSI   Diabetic diet   a1c 8.1      - HTN:   Controlled   Cont home meds       - GERD   Cont PPI      -Hypokalemia   Resolved      DVTppx: SCDs, lovenox   Gippx: Protonix   Code Status: Full code   Diet: Diabetic   Activity: OOB to chair TID and PRN   Discharge: Plan to discharge home pending progress and ultimate plan; ID, podiatry and vascular surg following                   Review of Systems:        Full ROS complete with pertinent positives and negatives as per HPI, otherwise negative     Objective:     Physical Exam:       Visit Vitals      BP  (!) 147/81     Pulse  81     Temp  98.2 F (36.8 C)     Resp  16     Ht  5\' 5"  (1.651 m)     Wt  81.6 kg (180 lb)     SpO2  99%        BMI  29.95 kg/m      O2 Flow Rate (L/min): 2 l/min  O2 Device: Room air      Temp (24hrs), Avg:98.2 F (36.8 C), Min:97.8 F (36.6 C), Max:99 F (37.2 C)     No intake/output data  recorded.    03/08 0701 - 03/09 1900   In: 500 [I.V.:500]   Out: 300 [Urine:300]            General:   Alert, cooperative, no distress, appears stated age.        Lungs:    Clear to auscultation bilaterally.        Heart:   Regular rate and rhythm, S1, S2 normal, no murmur, click, rub or gallop.        Abdomen:    Soft, non-tender, non-distended. Bowel sounds normal.         Extremities:  Extremities normal, atraumatic, no cyanosis. LLE 3+ edema     Skin:  Skin color, texture, turgor normal. LLE medial large scabbed ulcer; right great toe erythematous, swollen, small area dry gangrene noted        Neurologic:  CNII-XII intact. MAE, A&Ox4          Data Review:          Recent Days:     Recent Labs             11/26/19   0449  11/25/19   0458  11/24/19   0057     WBC  7.5  7.5  8.3     HGB  10.8*  11.4*  11.6     HCT  30.9*  33.1*  33.2*          PLT  372  378  406*          Recent Labs  11/26/19   0449  11/25/19   0458  11/24/19   0057     NA  136  137  137     K  3.2*  3.6  3.1*     CL  103  107  103     CO2  27  26  27      GLU  76  70  95     BUN  12  12  11      CREA  0.79  0.68  0.69          CA  10.1  10.2*  9.8        No results for input(s): PH, PCO2, PO2, HCO3, FIO2 in the last 72 hours.      24 Hour Results:     Recent Results (from the past 24 hour(s))     CBC WITH AUTOMATED DIFF          Collection Time: 11/26/19  4:49 AM         Result  Value  Ref Range            WBC  7.5  3.6 - 11.0 K/uL       RBC  3.41 (L)  3.80 - 5.20 M/uL       HGB  10.8 (L)  11.5 - 16.0 g/dL       HCT  30.9 (L)  35.0 - 47.0 %       MCV  90.6  80.0 - 99.0 FL       MCH  31.7  26.0 - 34.0 PG       MCHC  35.0  30.0 - 36.5 g/dL       RDW  13.0  11.5 - 14.5 %            PLATELET  372  150 - 400 K/uL            MPV  10.1  8.9 - 12.9 FL       NRBC  0.0  0 PER 100 WBC       ABSOLUTE NRBC  0.00  0.00 - 0.01 K/uL       NEUTROPHILS  70  32 - 75 %       LYMPHOCYTES  20  12 - 49 %       MONOCYTES  7  5 - 13 %       EOSINOPHILS  3  0 - 7  %       BASOPHILS  0  0 - 1 %       IMMATURE GRANULOCYTES  0  0.0 - 0.5 %       ABS. NEUTROPHILS  5.2  1.8 - 8.0 K/UL       ABS. LYMPHOCYTES  1.5  0.8 - 3.5 K/UL       ABS. MONOCYTES  0.6  0.0 - 1.0 K/UL       ABS. EOSINOPHILS  0.2  0.0 - 0.4 K/UL       ABS. BASOPHILS  0.0  0.0 - 0.1 K/UL       ABS. IMM. GRANS.  0.0  0.00 - 0.04 K/UL       DF  AUTOMATED          METABOLIC PANEL, BASIC          Collection Time: 11/26/19  4:49 AM         Result  Value  Ref Range  Sodium  136  136 - 145 mmol/L       Potassium  3.2 (L)  3.5 - 5.1 mmol/L       Chloride  103  97 - 108 mmol/L       CO2  27  21 - 32 mmol/L       Anion gap  6  5 - 15 mmol/L       Glucose  76  65 - 100 mg/dL       BUN  12  6 - 20 MG/DL       Creatinine  0.79  0.55 - 1.02 MG/DL       BUN/Creatinine ratio  15  12 - 20         GFR est AA  >60  >60 ml/min/1.6m2       GFR est non-AA  >60  >60 ml/min/1.78m2       Calcium  10.1  8.5 - 10.1 MG/DL       VANCOMYCIN, TROUGH          Collection Time: 11/26/19  4:49 AM         Result  Value  Ref Range            Vancomycin,trough  12.0 (H)  5.0 - 10.0 ug/mL       Reported dose date  NOT PROVIDED          Reported dose time:  NOT PROVIDED          Reported dose:  NOT PROVIDED  UNITS       GLUCOSE, POC          Collection Time: 11/26/19  7:00 AM         Result  Value  Ref Range            Glucose (POC)  69  65 - 100 mg/dL       Performed by  Malva Cogan         GLUCOSE, POC          Collection Time: 11/26/19  7:21 AM         Result  Value  Ref Range            Glucose (POC)  98  65 - 100 mg/dL       Performed by  Malva Cogan         ANKLE BRACHIAL INDEX          Collection Time: 11/26/19  9:11 AM         Result  Value  Ref Range            Left anterior tibial  55  mmHg       Left posterior tibial  57  mmHg       Right anterior tibial  66  mmHg       Right posterior tibial  76  mmHg       Left arm BP  168  mmHg       Right arm BP  168  mmHg       Left ABI  0.34         Right ABI  0.45         DUPLEX LOWER EXT  VEIN MAPPING LEFT          Collection Time: 11/26/19  9:12 AM         Result  Value  Ref Range  Left GSV Thigh Dist Diam  0.36  cm       Left GSV Thigh Mid Diam  0.36  cm       Left GSV Thigh Prox Diam  0.78  cm       Left GSV at Knee Diam  0.34  cm       Left GSV BK Prox Diam  0.25  cm       Left GSV Ankle Diam  0.34  cm       Left GSV BK Dist Diam  0.34  cm       GLUCOSE, POC          Collection Time: 11/26/19 11:33 AM         Result  Value  Ref Range            Glucose (POC)  99  65 - 100 mg/dL       Performed by  Tonia Ghent         GLUCOSE, POC          Collection Time: 11/26/19  4:20 PM         Result  Value  Ref Range            Glucose (POC)  165 (H)  65 - 100 mg/dL       Performed by  Sandi Raveling         GLUCOSE, POC          Collection Time: 11/26/19  9:22 PM         Result  Value  Ref Range            Glucose (POC)  99  65 - 100 mg/dL            Performed by  Norlene Duel             Problem List:      Problem List  as of 11/26/2019  Date Reviewed:  June 02, 2018                        Codes  Class  Noted - Resolved             Gangrene ([place])  ICD-10-CM: [id number]   ICD-9-CM: 785.4    11/23/2019 - Present                       Type 2 diabetes mellitus with diabetic neuropathy ([place])  ICD-10-CM: E11.40   ICD-9-CM: 250.60, 357.2    10/30/2018 - Present                       Type 2 diabetes with nephropathy ([place])  ICD-10-CM: E11.21   ICD-9-CM: 250.40, 583.81    02/23/2017 - Present                       Glaucoma of left eye  ICD-10-CM: H40.9   ICD-9-CM: 365.9    06/03/2016 - Present                       Cataract of left eye  ICD-10-CM: H26.9   ICD-9-CM: 366.9    06/03/2016 - Present                       Postmenopausal  ICD-10-CM: Z78.0   ICD-9-CM: V49.81    02/18/2014 - Present  Constipation  ICD-10-CM: K59.00   ICD-9-CM: 564.00    01/03/2013 - Present                       HTN (hypertension)  ICD-10-CM: I10   ICD-9-CM: 401.9    12/11/2012 - Present                       GERD (gastroesophageal  reflux disease)  ICD-10-CM: K21.9   ICD-9-CM: 530.81    12/11/2012 - Present                       Hypercholesterolemia  ICD-10-CM: E78.00   ICD-9-CM: 272.0    12/11/2012 - Present                       Diabetes ([place])  ICD-10-CM: E11.9   ICD-9-CM: 250.00    12/11/2012 - Present                          Medications reviewed     Current Facility-Administered Medications          Medication  Dose  Route  Frequency           ?  [START ON 11/27/2019] insulin glargine (LANTUS) injection 16 Units   16 Units  SubCUTAneous  QHS     ?  Vancomycin- Pharmacy to Dose     Other  Rx Dosing/Monitoring     ?  vancomycin (VANCOCIN) 1,000 mg in 0.9% sodium chloride 250 mL (VIAL-MATE)   1,000 mg  IntraVENous  Q12H     ?  gabapentin (NEURONTIN) capsule 300 mg   300 mg  Oral  QHS     ?  losartan (COZAAR) tablet 100 mg   100 mg  Oral  DAILY     ?  traMADoL (ULTRAM) tablet 50 mg   50 mg  Oral  Q4H PRN     ?  HYDROcodone-acetaminophen (NORCO) 5-325 mg per tablet 1 Tab   1 Tab  Oral  Q4H PRN     ?  morphine injection 1 mg   1 mg  IntraVENous  Q4H PRN     ?  docusate sodium (COLACE) capsule 100 mg   100 mg  Oral  BID     ?  senna (SENOKOT) tablet 8.6 mg   1 Tab  Oral  DAILY     ?  polyethylene glycol (MIRALAX) packet 17 g   17 g  Oral  DAILY PRN     ?  bisacodyL (DULCOLAX) suppository 10 mg   10 mg  Rectal  DAILY PRN     ?  hydroCHLOROthiazide (HYDRODIURIL) tablet 25 mg   25 mg  Oral  DAILY     ?  sodium chloride (NS) flush 5-40 mL   5-40 mL  IntraVENous  Q8H     ?  sodium chloride (NS) flush 5-40 mL   5-40 mL  IntraVENous  PRN     ?  acetaminophen (TYLENOL) tablet 650 mg   650 mg  Oral  Q6H PRN          Or           ?  acetaminophen (TYLENOL) suppository 650 mg   650 mg  Rectal  Q6H PRN     ?  polyethylene glycol (MIRALAX) packet 17 g   17 g  Oral  DAILY PRN           ?  promethazine (PHENERGAN) tablet 12.5 mg   12.5 mg  Oral  Q6H PRN          Or           ?  ondansetron (ZOFRAN) injection 4 mg   4 mg  IntraVENous  Q6H PRN     ?  glucose  chewable tablet 16 g   4 Tab  Oral  PRN     ?  dextrose (D50W) injection syrg 12.5-25 g   25-50 mL  IntraVENous  PRN     ?  glucagon (GLUCAGEN) injection 1 mg   1 mg  IntraMUSCular  PRN     ?  insulin lispro (HUMALOG) injection     SubCUTAneous  AC&HS     ?  aspirin delayed-release tablet 81 mg   81 mg  Oral  DAILY     ?  pantoprazole (PROTONIX) tablet 40 mg   40 mg  Oral  ACB     ?  amLODIPine (NORVASC) tablet 10 mg   10 mg  Oral  DAILY           ?  enoxaparin (LOVENOX) injection 40 mg   40 mg  SubCUTAneous  Q24H           Care Plan discussed with: Patient/family, nurse         [name], NP   Hospitalist   Providers can reach me on [employer]

## 2019-11-26 NOTE — Wound Image (Signed)
WOCN Note:     New consult for toe.     Chart shows:  Admitted for foot pain and edema  Seen by Dr. Timmothy Sours with plan for bypass surgery on 3/11  History of DM    Assessment:   Appropriately conversational and reports pain in her left foot after bumping it - reports RN gave her pain med.   Mobile and continent.     Bilateral heels intact and without erythema.  Heels offloaded with pillows.    1. POA dry gangrene to left great toe  1 x 1 x 0 cm  100% dry gangrene  No exudate or fluctuance; no malodor  Periwound intact & without erythema; no edema to foot    No topical wound care required at this time.   Available as needed post surgery by Dr. Timmothy Sours.    Transition of Care: Plan to follow weekly and as needed while admitted to hospital.     Alena Bills, BSN, RN, [facility]  Certified Wound, Ostomy, Continence Nurse  office [phone]  Available via [facility]

## 2019-11-26 NOTE — Other (Signed)
[place]    CLINICAL NURSE SPECIALIST CONSULT     FOLLOW UP  NOTE  Initial Presentation   Lindsey Huynh is a 69 y.o. female admitted over weekend with left foot pain. Left great toe gangrenous.   Initial work up and evaluation reveals NO osteomyelitits.      HX:   Past Medical History:   Diagnosis Date   ??? Diabetes ([place])    ??? GERD (gastroesophageal reflux disease)    ??? Hypercholesterolemia    ??? Hypertension         DX: CT-no osteo/ Xray-unremarkable    TX: Vascular surgery consulted- left leg medially with healing ulcerations consistent with venous ulcer. Plan for flow studies today.     Hospital course   Clinical progress has been complicated by venous insufficiency - BG trends mostly within goal during admission.  Diabetes    Patient has known Type 2 diabetes, treated with Levemir and Metformin PTA.     Admission BG 170 and A1c 8.15 indicate more acceptable diabetes control. Previous A1C 03/2019 11.0% . Highest A1C Feb. 2020-12.1%.   Ambulatory blood glucose management provided by primary care provider-Last seen 11/05/2019 virtual visit  Consulted by Provider for advanced diabetes nursing assessment and care, specifically related to   []  Transitioning off Glucostabilizer   []  Inpatient management strategy  [x]  Home management assessment  []  Survival skill education    Diabetes-related medical history  Acute complications  NONE  Neurological complications  Peripheral neuropathy  Microvascular disease  NONE  Macrovascular disease  Peripheral vascular disease and Foot wounds  Other associated conditions     HTN/hypercholesteremia/    Diabetes medication history  Drug class Currently in use Discontinued Never used   Biguanide Metformin 1000mg  daily in divided doses     DDP-4 inhibitor       Sulfonylurea      Thiazolidinedione      GLP-1 RA      SGLT-2 inhibitors      Basal insulin Levemir 20units daily     Bolus insulin      Fixed Dose  Combinations        Subjective   Slated for  vascular surgery-bypass Thursday per notes.    Patient reports the following home diabetes self-care practices:  Eating pattern-cut out "sugar", made some diet changes, with carb intake, especially breads.     Physical activity pattern-desires to be physically active, but recent pain in left leg has her unable to do any physical activity    Monitoring pattern-checks every other day before and after meals.      Taking medications pattern  [x]  Consistent administration  [x]  Affordable    Social determinants of health impacting diabetes self-management practices   Concerned that you need to know more about how to stay healthy with diabetes     Objective   Physical exam  General Alert, oriented and in no acute distress. Conversant and cooperative.   Vital Signs   Visit Vitals  BP (!) 147/84 (BP 1 Location: Left upper arm, BP Patient Position: At rest)   Pulse 79   Temp 99 ??F (37.2 ??C)   Resp 15   Ht 5\' 5"  (1.651 m)   Wt 81.6 kg (180 lb)   SpO2 96%   BMI 29.95 kg/m??     Skin  Warm and dry.    Heart   Regular rate and rhythm. No murmurs, rubs or gallops  Lungs  Clear to auscultation  without rales or rhonchi  Extremities     Diabetic foot exam:    Left Foot-=+2 edema noted left lower leg      Visual Exam: ulcer- left great toe under nail and 2nd toe-black eschar noted   Pulse DP: 1+ (weak)   Filament test: reduced sensation      Right Foot   Visual Exam: normal    Pulse DP: 2+ (normal)   Filament test: reduced sensation     DP & PT pulses +2.     Laboratory  BMP:   Lab Results   Component Value Date/Time    NA 136 11/26/2019 04:49 AM    K 3.2 (L) 11/26/2019 04:49 AM    CL 103 11/26/2019 04:49 AM    CO2 27 11/26/2019 04:49 AM    AGAP 6 11/26/2019 04:49 AM    GLU 76 11/26/2019 04:49 AM    BUN 12 11/26/2019 04:49 AM    CREA 0.79 11/26/2019 04:49 AM    GFRAA >60 11/26/2019 04:49 AM    GFRNA >60 11/26/2019 04:49 AM      Factors impacting BG management  Factor Dose Comments   Nutrition:  Carb-controlled meals     60 grams/meal       Pain Left foot    Infection     Other: venous insuffiencey  Vascular surgery following Flow studies pending     Blood glucose pattern        Assessment and Plan   Nursing Diagnosis Risk for unstable blood glucose pattern   Nursing Intervention Domain 5250 Decision-making Support   Nursing Interventions Examined current inpatient diabetes control   Explored factors facilitating and impeding inpatient management  Identified self-management practices impeding diabetes control  Explored corrective strategies with patient and responsible inpatient provider   Informed patient of rational for insulin strategy while hospitalized    Instructed patient in importance of checking blood glucose DAILY before meals - discussed when she should check her BG after meals-2hours     Evaluation   [name], with Type 2 diabetes, did not achieve diabetes control prior to admission, as evidenced by admission BG of 170 and A1c of 81%.  She is followed closely by her PCP for continued diabetes management.  She last saw PCP via virtual visit in Feb. 2021. Her A1C is improved since July 2020 when it was >11%. She is concerned about the fate of her left foot, but is encouraged that her A1C has lowered significantly since July 2020.      Noted hypoglycemia yesterday and this AM.  Would recommend decreasing basal insulin to decrease risk of hypoglycemia  Recommendations     [x]  Use of Subcutaneous Insulin Order set [phone])  Insulin Dosing Specific recommendation   ADJUST  Basal                                      (Based on weight, BMI & GFR) 16 units daily (20% reduction) On 11/27/2019- REDUCE basal insulin to 10units since will be NPO for surgery on 11/28/2019   CONTINUE Corrective                                       (Useful in adjusting insulin dosing) [x]  Normal sensitivity  Billing Code(s)   [x]  99231 IP subsequent hospital care - 15 minutes    Before making these care recommendations, I personally reviewed the  hospitalization record, including notes, laboratory & diagnostic data and current medications, and   examined the patient at the bedside (circumstances permitting) before making care recommendations.     Total minutes: 15    Lily Lovings, CNS  Diabetes Clinical Nurse Specialist  [facility]  Access via [place]

## 2019-11-27 LAB — CBC WITH AUTOMATED DIFF
ABS. BASOPHILS: 0 10*3/uL (ref 0.0–0.1)
ABS. EOSINOPHILS: 0.4 10*3/uL (ref 0.0–0.4)
ABS. IMM. GRANS.: 0 10*3/uL (ref 0.00–0.04)
ABS. LYMPHOCYTES: 1.8 10*3/uL (ref 0.8–3.5)
ABS. MONOCYTES: 0.7 10*3/uL (ref 0.0–1.0)
ABS. NEUTROPHILS: 5.1 10*3/uL (ref 1.8–8.0)
ABSOLUTE NRBC: 0 10*3/uL (ref 0.00–0.01)
BASOPHILS: 1 % (ref 0–1)
EOSINOPHILS: 5 % (ref 0–7)
HCT: 32.6 % — ABNORMAL LOW (ref 35.0–47.0)
HGB: 11.2 g/dL — ABNORMAL LOW (ref 11.5–16.0)
IMMATURE GRANULOCYTES: 0 % (ref 0.0–0.5)
LYMPHOCYTES: 23 % (ref 12–49)
MCH: 30.9 PG (ref 26.0–34.0)
MCHC: 34.4 g/dL (ref 30.0–36.5)
MCV: 90.1 FL (ref 80.0–99.0)
MONOCYTES: 8 % (ref 5–13)
MPV: 10.4 FL (ref 8.9–12.9)
NEUTROPHILS: 63 % (ref 32–75)
NRBC: 0 PER 100 WBC
PLATELET: 407 10*3/uL — ABNORMAL HIGH (ref 150–400)
RBC: 3.62 M/uL — ABNORMAL LOW (ref 3.80–5.20)
RDW: 12.7 % (ref 11.5–14.5)
WBC: 8 10*3/uL (ref 3.6–11.0)

## 2019-11-27 LAB — METABOLIC PANEL, BASIC
Anion gap: 6 mmol/L (ref 5–15)
BUN/Creatinine ratio: 18 (ref 12–20)
BUN: 13 MG/DL (ref 6–20)
CO2: 28 mmol/L (ref 21–32)
Calcium: 9.7 MG/DL (ref 8.5–10.1)
Chloride: 103 mmol/L (ref 97–108)
Creatinine: 0.74 MG/DL (ref 0.55–1.02)
GFR est AA: >60 mL/min/{1.73_m2} (ref >60)
GFR est non-AA: >60 mL/min/{1.73_m2} (ref >60)
Glucose: 66 mg/dL (ref 65–100)
Potassium: 3.2 mmol/L — ABNORMAL LOW (ref 3.5–5.1)
Sodium: 137 mmol/L (ref 136–145)

## 2019-11-27 LAB — GLUCOSE, POC
Glucose (POC): 99 mg/dL (ref 65–100)
Glucose (POC): 71 mg/dL (ref 65–100)
Glucose (POC): 153 mg/dL — ABNORMAL HIGH (ref 65–100)
Glucose (POC): 124 mg/dL — ABNORMAL HIGH (ref 65–100)

## 2019-11-27 LAB — CBC WITH AUTO DIFFERENTIAL
Basophils %: 1 % (ref 0–1)
Basophils Absolute: 0 10*3/uL (ref 0.0–0.1)
Eosinophils %: 5 % (ref 0–7)
Eosinophils Absolute: 0.4 10*3/uL (ref 0.0–0.4)
Granulocyte Absolute Count: 0 10*3/uL (ref 0.00–0.04)
Hematocrit: 32.6 % — ABNORMAL LOW (ref 35.0–47.0)
Hemoglobin: 11.2 g/dL — ABNORMAL LOW (ref 11.5–16.0)
Immature Granulocytes %: 0 % (ref 0.0–0.5)
Lymphocytes %: 23 % (ref 12–49)
Lymphocytes Absolute: 1.8 10*3/uL (ref 0.8–3.5)
MCH: 30.9 PG (ref 26.0–34.0)
MCHC: 34.4 g/dL (ref 30.0–36.5)
MCV: 90.1 FL (ref 80.0–99.0)
MPV: 10.4 FL (ref 8.9–12.9)
Monocytes %: 8 % (ref 5–13)
Monocytes Absolute: 0.7 10*3/uL (ref 0.0–1.0)
NRBC Absolute: 0 10*3/uL (ref 0.00–0.01)
Neutrophils %: 63 % (ref 32–75)
Neutrophils Absolute: 5.1 10*3/uL (ref 1.8–8.0)
Nucleated RBCs: 0 PER 100 WBC
Platelets: 407 10*3/uL — ABNORMAL HIGH (ref 150–400)
RBC: 3.62 M/uL — ABNORMAL LOW (ref 3.80–5.20)
RDW: 12.7 % (ref 11.5–14.5)
WBC: 8 10*3/uL (ref 3.6–11.0)

## 2019-11-27 LAB — BASIC METABOLIC PANEL
Anion Gap: 6 mmol/L (ref 5–15)
BUN/Creatinine Ratio: 18 (ref 12–20)
BUN: 13 MG/DL (ref 6–20)
CO2: 28 mmol/L (ref 21–32)
Calcium: 9.7 MG/DL (ref 8.5–10.1)
Chloride: 103 mmol/L (ref 97–108)
Creatinine: 0.74 MG/DL (ref 0.55–1.02)
GFR African American: >60 mL/min/{1.73_m2} (ref >60)
Glucose: 66 mg/dL (ref 65–100)
Potassium: 3.2 mmol/L — ABNORMAL LOW (ref 3.5–5.1)
Sodium: 137 mmol/L (ref 136–145)
eGFR NON-AA: >60 mL/min/{1.73_m2} (ref >60)

## 2019-11-27 LAB — POCT GLUCOSE
POC Glucose: 99 mg/dL (ref 65–100)
POC Glucose: 71 mg/dL (ref 65–100)
POC Glucose: 153 mg/dL — ABNORMAL HIGH (ref 65–100)
POC Glucose: 124 mg/dL — ABNORMAL HIGH (ref 65–100)

## 2019-11-27 MED ORDER — OXYCODONE 5 MG TAB
5 mg | ORAL | Status: DC | PRN
Start: 2019-11-27 — End: 2019-11-29
  Administered 2019-11-28 – 2019-11-29 (×2): via ORAL

## 2019-11-27 MED ORDER — OXYCODONE 5 MG TAB
5 mg | Freq: Once | ORAL | Status: AC
Start: 2019-11-27 — End: 2019-11-27
  Administered 2019-11-27: 21:00:00 via ORAL

## 2019-11-27 MED ORDER — LACTULOSE 10 GRAM/15 ML ORAL SOLUTION
10 gram/15 mL | ORAL | Status: AC
Start: 2019-11-27 — End: 2019-11-27
  Administered 2019-11-27 – 2019-11-28 (×3): via ORAL

## 2019-11-27 MED ORDER — INSULIN GLARGINE 100 UNIT/ML INJECTION
100 unit/mL | Freq: Every evening | SUBCUTANEOUS | Status: DC
Start: 2019-11-27 — End: 2019-12-04
  Administered 2019-11-28 – 2019-12-04 (×7): via SUBCUTANEOUS

## 2019-11-27 MED ORDER — TRAMADOL 50 MG TAB
50 mg | ORAL | Status: DC | PRN
Start: 2019-11-27 — End: 2019-11-29

## 2019-11-27 MED ORDER — SODIUM PHOSPHATES 19 GRAM-7 GRAM/118 ML ENEMA
19-7 gram/118 mL | RECTAL | Status: DC | PRN
Start: 2019-11-27 — End: 2019-12-18

## 2019-11-27 MED ORDER — OXYCODONE 5 MG TAB
5 mg | ORAL | Status: DC | PRN
Start: 2019-11-27 — End: 2019-12-18
  Administered 2019-11-28 – 2019-12-18 (×86): via ORAL

## 2019-11-27 MED ORDER — BISACODYL 10 MG RECTAL SUPPOSITORY
10 mg | Freq: Every day | RECTAL | Status: DC | PRN
Start: 2019-11-27 — End: 2019-11-28

## 2019-11-27 MED FILL — HYDROCODONE-ACETAMINOPHEN 5 MG-325 MG TAB: 5-325 mg | ORAL | Qty: 1

## 2019-11-27 MED FILL — LOSARTAN 50 MG TAB: 50 mg | ORAL | Qty: 2

## 2019-11-27 MED FILL — INSULIN LISPRO 100 UNIT/ML INJECTION: 100 unit/mL | SUBCUTANEOUS | Qty: 1

## 2019-11-27 MED FILL — LACTULOSE 20 GRAM/30 ML ORAL SOLUTION: 20 gram/30 mL | ORAL | Qty: 60

## 2019-11-27 MED FILL — HYDROCHLOROTHIAZIDE 25 MG TAB: 25 mg | ORAL | Qty: 1

## 2019-11-27 MED FILL — INSULIN GLARGINE 100 UNIT/ML INJECTION: 100 unit/mL | SUBCUTANEOUS | Qty: 1

## 2019-11-27 MED FILL — MORPHINE 2 MG/ML INJECTION: 2 mg/mL | INTRAMUSCULAR | Qty: 1

## 2019-11-27 MED FILL — AMLODIPINE 5 MG TAB: 5 mg | ORAL | Qty: 2

## 2019-11-27 MED FILL — ENEMA 19 GRAM-7 GRAM/118 ML: 19-7 gram/118 mL | RECTAL | Qty: 133

## 2019-11-27 MED FILL — VANCOMYCIN 1,000 MG IV SOLR: 1000 mg | INTRAVENOUS | Qty: 1000

## 2019-11-27 MED FILL — ASPIRIN 81 MG TAB, DELAYED RELEASE: 81 mg | ORAL | Qty: 1

## 2019-11-27 MED FILL — SENNA LAX 8.6 MG TABLET: 8.6 mg | ORAL | Qty: 1

## 2019-11-27 MED FILL — DOK 100 MG CAPSULE: 100 mg | ORAL | Qty: 1

## 2019-11-27 MED FILL — OXYCODONE 5 MG TAB: 5 mg | ORAL | Qty: 1

## 2019-11-27 MED FILL — GABAPENTIN 300 MG CAP: 300 mg | ORAL | Qty: 1

## 2019-11-27 MED FILL — PANTOPRAZOLE 40 MG TAB, DELAYED RELEASE: 40 mg | ORAL | Qty: 1

## 2019-11-27 NOTE — Progress Notes (Signed)
Vascular:    No new issues - plan left leg bypass tomorrow.

## 2019-11-27 NOTE — Progress Notes (Signed)
TOC: Vascular plans to do procedure tomorrow. Patient plans to discharge home with family to transport home when stable for discharge.    RUR: 12%    Medicare pt has received, reviewed, and signed 1st IM letter informing them of their right to appeal the discharge.  Signed copy has been placed on pt bedside chart.    Care Management Interventions  PCP Verified by CM: Yes  Palliative Care Criteria Met (RRAT>21 & CHF Dx)?: No  Mode of Transport at Discharge: Other (see comment)  Transition of Care Consult (CM Consult): Discharge Planning  MyChart Signup: No  Discharge Durable Medical Equipment: No  Physical Therapy Consult: Yes  Occupational Therapy Consult: No  Current Support Network: Lives Alone  Confirm Follow Up Transport: Family  The Plan for Transition of Care is Related to the Following Treatment Goals : Patient plans to discharge home when stable for discharge.  Veteran Resource Information Provided?: No  Discharge Location  Discharge Placement: Home with family assistance     Reason for Admission:  Gangrene                     RUR Score:  12%                 Plan for utilizing home health:   No indications at this time.       PCP: First and Last name:  Karsten Fells, NP. Phone: [phone]     Name of Practice:    Are you a current patient: Yes/No: Yes Approximate date of last visit: March, 2021   Can you participate in a virtual visit with your PCP: Yes                    Current Advanced Directive/Advance Care Plan: Full Code      Healthcare Decision Maker:   Click here to complete [place] including selection of the Healthcare Decision Maker Relationship (ie "Primary")                             Transition of Care Plan:       CM spoke with patient in room 558. Patient is alert and oriented x4. Demographics confirmed. Patient is independent in ADL's/IADL's, drives a vehicle and uses [facility] pharmacy on [address] [place]. Patient's emergency contact is her daughter: [name], phone:  [phone]. Patient plans to discharge home with family to transport when stable for discharge. CM following for discharge needs.     Nathanial Millman RN/CRM

## 2019-11-27 NOTE — Other (Signed)
[place]    CLINICAL NURSE SPECIALIST CONSULT     FOLLOW UP  NOTE  Initial Presentation   Lindsey Huynh is a 69 y.o. female admitted over weekend with left foot pain. Left great toe gangrenous.   Initial work up and evaluation reveals NO osteomyelitits.      HX:   Past Medical History:   Diagnosis Date   ??? Diabetes ([place])    ??? GERD (gastroesophageal reflux disease)    ??? Hypercholesterolemia    ??? Hypertension         DX: CT-no osteo/ Xray-unremarkable    TX: Vascular surgery consulted- left leg medially with healing ulcerations consistent with venous ulcer. Plan for flow studies today.     Hospital course   Clinical progress has been complicated by venous insufficiency - BG trends mostly within goal during admission.  Diabetes    Patient has known Type 2 diabetes, treated with Levemir and Metformin PTA.     Admission BG 170 and A1c 8.15 indicate more acceptable diabetes control. Previous A1C 03/2019 11.0% . Highest A1C Feb. 2020-12.1%.   Ambulatory blood glucose management provided by primary care provider-Last seen 11/05/2019 virtual visit  Consulted by Provider for advanced diabetes nursing assessment and care, specifically related to   []  Transitioning off Glucostabilizer   []  Inpatient management strategy  [x]  Home management assessment  []  Survival skill education    Diabetes-related medical history  Acute complications  NONE  Neurological complications  Peripheral neuropathy  Microvascular disease  NONE  Macrovascular disease  Peripheral vascular disease and Foot wounds  Other associated conditions     HTN/hypercholesteremia/    Diabetes medication history  Drug class Currently in use Discontinued Never used   Biguanide Metformin 1000mg  daily in divided doses     DDP-4 inhibitor       Sulfonylurea      Thiazolidinedione      GLP-1 RA      SGLT-2 inhibitors      Basal insulin Levemir 20units daily     Bolus insulin      Fixed Dose  Combinations        Subjective   Slated for  vascular surgery-bypass Thursday per notes.    Fasting BG 71mg /dl.      Patient reports the following home diabetes self-care practices:  Eating pattern-cut out "sugar", made some diet changes, with carb intake, especially breads.     Physical activity pattern-desires to be physically active, but recent pain in left leg has her unable to do any physical activity    Monitoring pattern-checks every other day before and after meals.      Taking medications pattern  [x]  Consistent administration  [x]  Affordable    Social determinants of health impacting diabetes self-management practices   Concerned that you need to know more about how to stay healthy with diabetes     Objective   Physical exam  General Alert, oriented and in no acute distress. Conversant and cooperative.   Vital Signs   Visit Vitals  BP 125/77 (BP 1 Location: Right upper arm, BP Patient Position: At rest)   Pulse 81   Temp 97.6 ??F (36.4 ??C)   Resp 16   Ht 5\' 5"  (1.651 m)   Wt 81.6 kg (180 lb)   SpO2 98%   BMI 29.95 kg/m??     Skin  Warm and dry.    Heart   Regular rate and rhythm. No murmurs, rubs or  gallops  Lungs  Clear to auscultation without rales or rhonchi  Extremities     Diabetic foot exam:    Left Foot-=+2 edema noted left lower leg      Visual Exam: ulcer- left great toe under nail and 2nd toe-black eschar noted   Pulse DP: 1+ (weak)   Filament test: reduced sensation      Right Foot   Visual Exam: normal    Pulse DP: 2+ (normal)   Filament test: reduced sensation     DP & PT pulses +2.     Laboratory  BMP:   Lab Results   Component Value Date/Time    NA 137 11/27/2019 05:00 AM    K 3.2 (L) 11/27/2019 05:00 AM    CL 103 11/27/2019 05:00 AM    CO2 28 11/27/2019 05:00 AM    AGAP 6 11/27/2019 05:00 AM    GLU 66 11/27/2019 05:00 AM    BUN 13 11/27/2019 05:00 AM    CREA 0.74 11/27/2019 05:00 AM    GFRAA >60 11/27/2019 05:00 AM    GFRNA >60 11/27/2019 05:00 AM      Factors impacting BG management  Factor Dose Comments   Nutrition:  Carb-controlled  meals     60 grams/meal      Pain Left foot    Infection     Other: venous insuffiencey  Vascular surgery following Flow studies pending     Blood glucose pattern        Assessment and Plan   Nursing Diagnosis Risk for unstable blood glucose pattern   Nursing Intervention Domain 5250 Decision-making Support   Nursing Interventions Examined current inpatient diabetes control   Explored factors facilitating and impeding inpatient management  Identified self-management practices impeding diabetes control  Explored corrective strategies with patient and responsible inpatient provider   Informed patient of rational for insulin strategy while hospitalized    Instructed patient in importance of checking blood glucose DAILY before meals - discussed when she should check her BG after meals-2hours     Evaluation   [name], with Type 2 diabetes, did not achieve diabetes control prior to admission, as evidenced by admission BG of 170 and A1c of 81%.  She is followed closely by her PCP for continued diabetes management.  She last saw PCP via virtual visit in Feb. 2021. Her A1C is improved since July 2020 when it was >11%. She is concerned about the fate of her left foot, but is encouraged that her A1C has lowered significantly since July 2020.      Noted hypoglycemia yesterday and this AM.  Would recommend decreasing basal insulin to decrease risk of hypoglycemia  Recommendations     [x]  Use of Subcutaneous Insulin Order set [id number])  Insulin Dosing Specific recommendation   ADJUST  Basal                                      (Based on weight, BMI & GFR) 16 units daily (20% reduction) On 11/27/2019- REDUCE basal insulin to 10units since will be NPO for surgery on 11/28/2019   CONTINUE Corrective                                       (Useful in adjusting insulin dosing) [x]  Normal sensitivity  Billing Code(s)   [x]  99231 IP subsequent hospital care - 15 minutes    Before making these care recommendations, I personally  reviewed the hospitalization record, including notes, laboratory & diagnostic data and current medications, and   examined the patient at the bedside (circumstances permitting) before making care recommendations.     Total minutes: 15    Lily Lovings, CNS  Diabetes Clinical Nurse Specialist  [facility]  Access via [place]

## 2019-11-27 NOTE — Other (Signed)
Bedside shift change report given to Dan ,RN (oncoming nurse) by Devon Jones, RN(offgoing nurse).  Report given with SBAR.

## 2019-11-27 NOTE — Progress Notes (Signed)
Problem: Mobility Impaired (Adult and Pediatric)  Goal: *Acute Goals and Plan of Care (Insert Text)  Description: FUNCTIONAL STATUS PRIOR TO ADMISSION: Patient was independent and active without use of DME.    HOME SUPPORT PRIOR TO ADMISSION: The patient lived alone with no local support.    Physical Therapy Goals  Initiated 11/26/2019  1.  Patient will move from supine to sit and sit to supine , scoot up and down, and roll side to side in bed with supervision/set-up within 7 day(s).    2.  Patient will transfer from bed to chair and chair to bed with supervision/set-up using the least restrictive device within 7 day(s).  3.  Patient will perform sit to stand with supervision/set-up within 7 day(s).  4.  Patient will ambulate with supervision/set-up for 50 feet with the least restrictive device within 7 day(s).   5.  Patient will ascend/descend 4 stairs with cane and one handrail(s) with supervision/set-up within 7 day(s).     Outcome: Progressing Towards Goal   PHYSICAL THERAPY TREATMENT  Patient: Lindsey Huynh [id number] y.o. female)  Date: 11/27/2019  Diagnosis: Gangrene ([place]) [I96] <principal problem not specified>  Procedure(s) (LRB):  LEFT LEG ARTERIOGRAM (Left) 2 Days Post-Op  Precautions: Fall  Chart, physical therapy assessment, plan of care and goals were reviewed.    ASSESSMENT  Patient continues with skilled PT services and is progressing towards goals. Patient performed LE exercises in supine with almost no cues. Transferred to chair at bedside and set up for lunch. Surgery planned for tomorrow, mid-day..     Other factors to consider for discharge: No assistance in the home         PLAN :  Patient continues to benefit from skilled intervention to address the above impairments.  Continue treatment per established plan of care.  to address goals.    Recommendation for discharge: (in order for the patient to meet his/her long term goals)  To be determined:      This discharge recommendation:  A follow-up  discussion with the attending provider and/or case management is planned    IF patient discharges home will need the following DME: to be determined (TBD)       SUBJECTIVE:   Patient stated ???I am anxious to get this surgery over with..???    OBJECTIVE DATA SUMMARY:   Critical Behavior:  Neurologic State: Alert  Orientation Level: Oriented X4  Cognition: Appropriate decision making, Appropriate for age attention/concentration, Appropriate safety awareness, Follows commands     Functional Mobility Training:  Bed Mobility:     Supine to Sit: Supervision  Sit to Supine: Supervision           Transfers:  Sit to Stand: Supervision  Stand to Sit: Supervision                             Balance:  Sitting: Intact  Standing: Impaired;Without support  Standing - Static: Good;Constant support  Standing - Dynamic : Fair;Constant support  Ambulation/Gait Training:        Therapeutic Exercises:   Performed LE exs for strength and ROM-almost no cues required    Pain Rating:  6/10 in foot with weight bearing    Activity Tolerance:   Fair-limited by pain    After treatment patient left in no apparent distress:   Sitting in chair, Call bell within reach, and nurse notified.    COMMUNICATION/COLLABORATION:   The patient???s plan of care  was discussed with: Registered nurse.     Hilda Lias   Time Calculation: 15 mins

## 2019-11-27 NOTE — Progress Notes (Deleted)
Progress Notes by Orlinda Blalock, NP at 11/27/19 1542                Author: Orlinda Blalock, NP  Service: Nurse Practitioner  Author Type: Nurse Practitioner       Filed: 11/28/19 0150  Date of Service: 11/27/19 1542  Status: Cosign Needed Addendum          Editor: Orlinda Blalock, NP (Nurse Practitioner)       Related Notes: Original Note by Orlinda Blalock, NP (Nurse Practitioner) filed at 11/28/19  0149          Cosign Required: Yes                                                 Hospitalist Progress Note           [name], NP   Please call operator and page for questions.   Call physician on-call through the operator 7pm-7am      Daily Progress Note: 11/27/2019      Primary care provider:Ivey, Gae Gallop, NP      Date of admission: 11/23/2019 12:32 PM      Admission Summary and Hospital Course:        From H&P 11/23/2019:   "Lindsey Huynh is a 69 y.o. female with past medical history of diabetes, insulin-dependent, GERD and hypertension comes for return of left foot pain and swelling.   Patient is awake, alert and oriented able to answer my question follow my request, data also obtained in the ED staff and extensive chart review.  As per collective  reports, patient left great toe pain which associated darkening of the skin on the distal end of the toe and swelling of the left foot the  medial aspect of the Of the last 1 week and has been progressively getting worse.  The only thing that he remembers was fungal nail cream.  She reported since her left second toe is in contact with the first toe and that pain he also became slightly  cellulitic, she also has some dorsal swelling of the foot as well.  This all progress from being mildly achy to significant tender and swollen last 1 week and she came today because the pain was unbearable, 10/10, getting from her toe to her foot and  made her ambulation significantly difficult.  Although she continues to have pain at rest.  She has not seek  medical help for that until today, did not remember any trauma or injury to her feet.  She is hemodynamically otherwise stable in the ED on  her own.  Patient was hemodynamically stable in the ED, and work revealed hypokalemia leukocytosis and x-ray the foot was without any acute abnormality.  Given concerns for dry gangrene and cellulitis, she was started on IV vancomycin. Patient has no  other medical complaints.  Patient will be admitted to hospitalist service for evaluation management."        Subjective:     F/U for left foot cellulitis, left great toe gangrene, DMII, HTN, GERD, Hypokalemia, left popliteal occlusion      Pt seen today in mild distress due to pain. She reports that since her tests yesterday, her leg pain has worsened significantly. She also reports that Dr. Timmothy Sours wants her to have a BM before surgery tomorrow. She doesn't  feel constipated but hasn't  had a BM since Sunday or Monday. Otherwise has no complaints.         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                   Review of Systems:        Full ROS complete with pertinent positives and negatives as per HPI, otherwise negative     Objective:     Physical Exam:       Visit Vitals      BP  129/75 (BP 1 Location: Right upper arm, BP Patient Position: At rest)     Pulse  72     Temp  98 F (36.7 C)     Resp  17     Ht  5\' 5"  (1.651 m)     Wt  81.6 kg (180 lb)     SpO2  97%        BMI  29.95 kg/m      O2 Flow Rate (L/min): 2 l/min  O2 Device: Room air      Temp (24hrs), Avg:98 F (36.7 C), Min:97.6 F (36.4 C), Max:98.3 F (36.8 C)     03/10 0701 - 03/10 1900   In: 320 [P.O.:320]   Out: -    No intake/output data recorded.            General:   Alert, cooperative, no distress, appears stated age.        Lungs:    Clear to auscultation bilaterally.        Heart:   Regular rate and rhythm, S1, S2 normal, no murmur, click, rub or gallop.        Abdomen:    Soft, non-tender, non-distended. Bowel sounds normal.         Extremities:  Extremities normal, atraumatic, no cyanosis. LLE 3+ edema     Skin:  Skin color, texture, turgor normal. LLE medial large scabbed ulcer; right great toe erythematous, swollen, small area dry gangrene noted        Neurologic:  CNII-XII intact. MAE, A&Ox4          Data Review:          Recent Days:     Recent Labs             03 /10/21   0500  11/26/19   0449  11/25/19   0458     WBC  8.0  7.5  7.5     HGB  11.2*  10.8*  11.4*     HCT  32.6*  30.9*  33.1*          PLT  407*  372  378          Recent Labs             11/27/19   0500  11/26/19   0449  11/25/19   0458     NA  137  136  137     K  3.2*  3.2*  3.6     CL  103  103  107     CO2  28  27  26      GLU  66  76  70     BUN  13  12  12      CREA  0.74  0.79  0.68          CA  9.7  10.1  10.2*        No results for input(s): PH, PCO2, PO2, HCO3, FIO2 in the last 72 hours.      24 Hour Results:     Recent Results (from the past 24 hour(s))     GLUCOSE, POC          Collection Time: 11/26/19  4:20 PM         Result  Value  Ref Range            Glucose (POC)  165 (H)  65 - 100 mg/dL       Performed by  Sandi Raveling         GLUCOSE, POC          Collection Time: 11/26/19  9:22 PM         Result  Value  Ref Range            Glucose (POC)  99  65 - 100 mg/dL       Performed by  Norlene Duel         CBC WITH AUTOMATED DIFF          Collection Time: 11/27/19  5:00  AM         Result  Value  Ref Range            WBC  8.0  3.6 - 11.0 K/uL            RBC  3.62 (L)  3.80 - 5.20 M/uL            HGB  11.2 (L)  11.5 - 16.0 g/dL       HCT  32.6 (L)  35.0 - 47.0 %       MCV  90.1  80.0 - 99.0 FL       MCH  30.9  26.0 - 34.0 PG       MCHC  34.4  30.0 - 36.5 g/dL       RDW  12.7  11.5 - 14.5 %       PLATELET  407 (H)  150 - 400 K/uL       MPV  10.4  8.9 - 12.9 FL       NRBC  0.0  0 PER 100 WBC       ABSOLUTE NRBC  0.00  0.00 - 0.01 K/uL       NEUTROPHILS  63  32 - 75 %       LYMPHOCYTES  23  12 - 49 %  MONOCYTES  8  5 - 13 %       EOSINOPHILS  5  0 - 7 %       BASOPHILS  1  0 - 1 %       IMMATURE GRANULOCYTES  0  0.0 - 0.5 %       ABS. NEUTROPHILS  5.1  1.8 - 8.0 K/UL       ABS. LYMPHOCYTES  1.8  0.8 - 3.5 K/UL       ABS. MONOCYTES  0.7  0.0 - 1.0 K/UL       ABS. EOSINOPHILS  0.4  0.0 - 0.4 K/UL       ABS. BASOPHILS  0.0  0.0 - 0.1 K/UL       ABS. IMM. GRANS.  0.0  0.00 - 0.04 K/UL       DF  AUTOMATED          METABOLIC PANEL, BASIC          Collection Time: 11/27/19  5:00 AM         Result  Value  Ref Range            Sodium  137  136 - 145 mmol/L       Potassium  3.2 (L)  3.5 - 5.1 mmol/L       Chloride  103  97 - 108 mmol/L       CO2  28  21 - 32 mmol/L       Anion gap  6  5 - 15 mmol/L       Glucose  66  65 - 100 mg/dL            BUN  13  6 - 20 MG/DL            Creatinine  0.74  0.55 - 1.02 MG/DL       BUN/Creatinine ratio  18  12 - 20         GFR est AA  >60  >60 ml/min/1.50m2       GFR est non-AA  >60  >60 ml/min/1.78m2       Calcium  9.7  8.5 - 10.1 MG/DL       GLUCOSE, POC          Collection Time: 11/27/19  7:03 AM         Result  Value  Ref Range            Glucose (POC)  71  65 - 100 mg/dL       Performed by  Malva Cogan         GLUCOSE, POC          Collection Time: 11/27/19 11:08 AM         Result  Value  Ref Range            Glucose (POC)  153 (H)  65 - 100 mg/dL            Performed by  Drue Flirt PCT             Problem List:      Problem List  as of 11/27/2019   Date Reviewed:  2018/06/11                        Codes  Class  Noted - Resolved             Gangrene ([place])  ICD-10-CM:  [id number]   ICD-9-CM: 785.4    11/23/2019 - Present                       Type 2 diabetes mellitus with diabetic neuropathy (HCC)  ICD-10-CM: E11.40   ICD-9-CM: 250.60, 357.2    10/30/2018 - Present                       Type 2 diabetes with nephropathy ([place])  ICD-10-CM: E11.21   ICD-9-CM: 250.40, 583.81    02/23/2017 - Present                       Glaucoma of left eye  ICD-10-CM: H40.9   ICD-9-CM: 365.9    06/03/2016 - Present                       Cataract of left eye  ICD-10-CM: H26.9   ICD-9-CM: 366.9    06/03/2016 - Present                       Postmenopausal  ICD-10-CM: Z78.0   ICD-9-CM: V49.81    02/18/2014 - Present                       Constipation  ICD-10-CM: K59.00   ICD-9-CM: 564.00    01/03/2013 - Present                       HTN (hypertension)  ICD-10-CM: I10   ICD-9-CM: 401.9    12/11/2012 - Present                       GERD (gastroesophageal reflux disease)  ICD-10-CM: K21.9   ICD-9-CM: 530.81    12/11/2012 - Present                       Hypercholesterolemia  ICD-10-CM: E78.00   ICD-9-CM: 272.0    12/11/2012 - Present                       Diabetes ([place])  ICD-10-CM: E11.9   ICD-9-CM: 250.00    12/11/2012 - Present                          Medications reviewed     Current Facility-Administered Medications          Medication  Dose  Route  Frequency           ?  bisacodyL (DULCOLAX) suppository 10 mg   10 mg  Rectal  DAILY PRN     ?  sodium phosphate (FLEET'S) enema 1 Enema   1 Enema  Rectal  PRN     ?  oxyCODONE IR (ROXICODONE) tablet 5 mg   5 mg  Oral  Q4H PRN     ?  oxyCODONE IR (ROXICODONE) tablet 10 mg   10 mg  Oral  Q4H PRN     ?  lactulose (CHRONULAC) 10 gram/15 mL solution 45 mL   45 mL  Oral  Q2H     ?  traMADoL (ULTRAM) tablet 50 mg   50 mg  Oral  Q4H PRN     ?  insulin glargine (LANTUS) injection 16 Units   16 Units  SubCUTAneous  QHS     ?  Vancomycin- Pharmacy to Dose     Other  Rx  Dosing/Monitoring     ?  vancomycin (VANCOCIN) 1,000 mg in 0.9% sodium chloride 250 mL (VIAL-MATE)   1,000 mg  IntraVENous  Q12H     ?  gabapentin (NEURONTIN) capsule 300 mg   300 mg  Oral  QHS     ?  losartan (COZAAR) tablet 100 mg   100 mg  Oral  DAILY     ?  morphine injection 1 mg   1 mg  IntraVENous  Q4H PRN     ?  docusate sodium (COLACE) capsule 100 mg   100 mg  Oral  BID     ?  senna (SENOKOT) tablet 8.6 mg   1 Tab  Oral  DAILY           ?  polyethylene glycol (MIRALAX) packet 17 g   17 g  Oral  DAILY PRN           ?  bisacodyL (DULCOLAX) suppository 10 mg   10 mg  Rectal  DAILY PRN     ?  hydroCHLOROthiazide (HYDRODIURIL) tablet 25 mg   25 mg  Oral  DAILY     ?  sodium chloride (NS) flush 5-40 mL   5-40 mL  IntraVENous  Q8H     ?  sodium chloride (NS) flush 5-40 mL   5-40 mL  IntraVENous  PRN     ?  acetaminophen (TYLENOL) tablet 650 mg   650 mg  Oral  Q6H PRN          Or           ?  acetaminophen (TYLENOL) suppository 650 mg   650 mg  Rectal  Q6H PRN     ?  polyethylene glycol (MIRALAX) packet 17 g   17 g  Oral  DAILY PRN     ?  promethazine (PHENERGAN) tablet 12.5 mg   12.5 mg  Oral  Q6H PRN          Or           ?  ondansetron (ZOFRAN) injection 4 mg   4 mg  IntraVENous  Q6H PRN     ?  glucose chewable tablet 16 g   4 Tab  Oral  PRN     ?  dextrose (D50W) injection syrg 12.5-25 g   25-50 mL  IntraVENous  PRN     ?  glucagon (GLUCAGEN) injection 1 mg   1 mg  IntraMUSCular  PRN     ?  insulin lispro (HUMALOG) injection     SubCUTAneous  AC&HS     ?  aspirin delayed-release tablet 81 mg   81 mg  Oral  DAILY     ?  pantoprazole (PROTONIX) tablet 40 mg   40 mg  Oral  ACB     ?  amLODIPine (NORVASC) tablet 10 mg   10 mg  Oral  DAILY           ?  [Held by provider] enoxaparin (LOVENOX) injection 40 mg   40 mg  SubCUTAneous  Q24H           Care Plan discussed with: Patient/family, nurse         [name], NP   Hospitalist   Providers can reach me on [employer]

## 2019-11-27 NOTE — Progress Notes (Signed)
 TOC: Vascular plans to do procedure tomorrow. Patient plans to discharge home with family to transport home when stable for discharge.    RUR: 12%    Medicare pt has received, reviewed, and signed 1st IM letter informing them of their right to appeal the discharge.  Signed copy has been placed on pt bedside chart.    Care Management Interventions  PCP Verified by CM: Yes  Palliative Care Criteria Met (RRAT>21 & CHF Dx)?: No  Mode of Transport at Discharge: Other (see comment)  Transition of Care Consult (CM Consult): Discharge Planning  MyChart Signup: No  Discharge Durable Medical Equipment: No  Physical Therapy Consult: Yes  Occupational Therapy Consult: No  Current Support Network: Lives Alone  Confirm Follow Up Transport: Family  The Plan for Transition of Care is Related to the Following Treatment Goals : Patient plans to discharge home when stable for discharge.  [facility] Resource Information Provided?: No  Discharge Location  Discharge Placement: Home with family assistance     Reason for Admission:  Gangrene                     RUR Score:  12%                 Plan for utilizing home health:   No indications at this time.       PCP: First and Last name:  Fleeta Carline DASEN, NP. Phone: [phone]     Name of Practice:    Are you a current patient: Yes/No: Yes Approximate date of last visit: March, 2021   Can you participate in a virtual visit with your PCP: Yes                    Current Advanced Directive/Advance Care Plan: Full Code      Healthcare Decision Maker:   Click here to complete HealthCare Decision Makers including selection of the Healthcare Decision Maker Relationship (ie Primary)                             Transition of Care Plan:       CM spoke with patient in room 558. Patient is alert and oriented x4. Demographics confirmed. Patient is independent in ADL's/IADL's, drives a vehicle and uses [facility] pharmacy on [address]. Patient's emergency contact is her daughter: Dawna Roys, phone:  [phone]. Patient plans to discharge home with family to transport when stable for discharge. CM following for discharge needs.     Burnard Griffiths RN/CRM

## 2019-11-27 NOTE — Progress Notes (Signed)
 Problem: Mobility Impaired (Adult and Pediatric)  Goal: *Acute Goals and Plan of Care (Insert Text)  Description: FUNCTIONAL STATUS PRIOR TO ADMISSION: Patient was independent and active without use of DME.    HOME SUPPORT PRIOR TO ADMISSION: The patient lived alone with no local support.    Physical Therapy Goals  Initiated 11/26/2019  1.  Patient will move from supine to sit and sit to supine , scoot up and down, and roll side to side in bed with supervision/set-up within 7 day(s).    2.  Patient will transfer from bed to chair and chair to bed with supervision/set-up using the least restrictive device within 7 day(s).  3.  Patient will perform sit to stand with supervision/set-up within 7 day(s).  4.  Patient will ambulate with supervision/set-up for 50 feet with the least restrictive device within 7 day(s).   5.  Patient will ascend/descend 4 stairs with cane and one handrail(s) with supervision/set-up within 7 day(s).     Outcome: Progressing Towards Goal   PHYSICAL THERAPY TREATMENT  Patient: Lindsey Huynh (69 y.o. 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    ASSESSMENT  Patient continues with skilled PT services and is progressing towards goals. Patient performed LE exercises in supine with almost no cues. Transferred to chair at bedside and set up for lunch. Surgery planned for tomorrow, mid-day..     Other factors to consider for discharge: No assistance in the home         PLAN :  Patient continues to benefit from skilled intervention to address the above impairments.  Continue treatment per established plan of care.  to address goals.    Recommendation for discharge: (in order for the patient to meet his/her long term goals)  To be determined:      This discharge recommendation:  A follow-up  discussion with the attending provider and/or case management is planned    IF patient discharges home will need the following DME: to be determined (TBD)       SUBJECTIVE:   Patient stated "I am anxious to get this surgery over with.."    OBJECTIVE DATA SUMMARY:   Critical Behavior:  Neurologic State: Alert  Orientation Level: Oriented X4  Cognition: Appropriate decision making, Appropriate for age attention/concentration, Appropriate safety awareness, Follows commands     Functional Mobility Training:  Bed Mobility:     Supine to Sit: Supervision  Sit to Supine: Supervision           Transfers:  Sit to Stand: Supervision  Stand to Sit: Supervision                             Balance:  Sitting: Intact  Standing: Impaired;Without support  Standing - Static: Good;Constant support  Standing - Dynamic : Fair;Constant support  Ambulation/Gait Training:        Therapeutic Exercises:   Performed LE exs for strength and ROM-almost no cues required    Pain Rating:  6/10 in foot with weight bearing    Activity Tolerance:   Fair-limited by pain    After treatment patient left in no apparent distress:   Sitting in chair, Call bell within reach, and nurse notified.    COMMUNICATION/COLLABORATION:   The patient's plan of care  was discussed with: Registered nurse.     Leita ONEIDA Silvan   Time Calculation: 15 mins

## 2019-11-27 NOTE — Group Note (Signed)
 Diabetes Mgmt by Moises Rosaline LABOR, CNS at 11/27/19 1001                Author: Moises Rosaline LABOR, CNS  Service: Certified Clinical Nurse Specialist  Author Type: Clinical Nurse Specialist       Filed: 11/27/19 1006  Date of Service: 11/27/19 1001  Status: Signed          Editor: Moises Rosaline LABOR, CNS (Clinical Nurse Specialist)               [place]   [facility]      CLINICAL NURSE SPECIALIST CONSULT       FOLLOW UP  NOTE     Initial Presentation     Lindsey Huynh is a 69 y.o.  female admitted over weekend with left foot pain . Left great toe gangrenous.   Initial work up and evaluation reveals NO osteomyelitits.        HX:      Past Medical History:        Diagnosis  Date         ?  Diabetes (HCC)       ?  GERD (gastroesophageal reflux disease)       ?  Hypercholesterolemia           ?  Hypertension              DX: CT-no osteo/ Xray-unremarkable      TX: Vascular surgery consulted- left leg medially with healing ulcerations consistent with venous ulcer. Plan for flow studies today.         Hospital course     Clinical progress has been complicated by venous insufficiency - BG trends mostly within goal during admission.     Diabetes      Patient has known Type 2 diabetes, treated with Levemir and Metformin   PTA.       Admission BG 170 and A1c 8.15 indicate more acceptable diabetes control. Previous A1C 03/2019 11.0% . Highest A1C Feb. 2020-12.1%.    Ambulatory blood glucose management provided by primary care provider-Last seen 11/05/2019 virtual visit   Consulted by Provider for advanced diabetes nursing assessment and care, specifically related  to    []  Transitioning off Glucostabilizer    []  Inpatient management strategy   [x]  Home management assessment   []  Survival skill education      Diabetes-related medical history   Acute complications   NONE   Neurological complications   Peripheral neuropathy   Microvascular disease   NONE   Macrovascular disease   Peripheral  vascular disease and Foot wounds   Other associated conditions      HTN/hypercholesteremia/      Diabetes medication history        Drug class  Currently in use  Discontinued  Never used          Biguanide  Metformin  1000mg  daily in divided doses              DDP-4 inhibitor                 Sulfonylurea                Thiazolidinedione                GLP-1 RA                SGLT-2 inhibitors  Basal insulin   Levemir 20units daily              Bolus insulin                 Fixed Dose  Combinations                Subjective     Slated for vascular surgery-bypass Thursday per notes.      Fasting BG 71mg /dl.        Patient reports the following home diabetes self-care practices:  Eating pattern-cut out sugar, made some diet changes, with carb intake, especially breads.       Physical activity pattern-desires to be physically active, but recent pain in left leg has her unable to do any physical activity      Monitoring pattern-checks every other day before and after meals.        Taking medications pattern   [x]  Consistent administration   [x]  Affordable      Social determinants of health impacting diabetes self-management practices    Concerned that you need to know more about how to stay healthy with diabetes         Objective     Physical exam   General Alert, oriented and in no acute distress. Conversant and cooperative.    Vital Signs    Visit Vitals      BP  125/77 (BP 1 Location: Right upper arm, BP Patient Position: At rest)     Pulse  81     Temp  97.6 F (36.4 C)     Resp  16     Ht  5' 5 (1.651 m)     Wt  81.6 kg (180 lb)     SpO2  98%        BMI  29.95 kg/m        Skin  Warm and dry.     Heart   Regular rate and rhythm. No murmurs, rubs or gallops   Lungs  Clear to auscultation without rales or rhonchi   Extremities      Diabetic foot exam:     Left Foot-=+2 edema noted left lower leg       Visual Exam: ulcer- left great toe under nail and 2nd toe-black eschar noted    Pulse DP: 1+ (weak)     Filament test: reduced sensation        Right Foot    Visual Exam: normal     Pulse DP: 2+ (normal)    Filament test: reduced sensation      DP & PT pulses +2.       Laboratory   BMP:      Lab Results         Component  Value  Date/Time            NA  137  11/27/2019 05:00 AM       K  3.2 (L)  11/27/2019 05:00 AM       CL  103  11/27/2019 05:00 AM       CO2  28  11/27/2019 05:00 AM       AGAP  6  11/27/2019 05:00 AM       GLU  66  11/27/2019 05:00 AM       BUN  13  11/27/2019 05:00 AM       CREA  0.74  11/27/2019 05:00 AM       GFRAA  >60  11/27/2019 05:00  AM            GFRNA  >60  11/27/2019 05:00 AM         Factors impacting BG management       Factor  Dose  Comments         Nutrition:   Carb-controlled meals        60 grams/meal          Pain  Left foot           Infection             Other: venous insuffiencey   Vascular surgery following  Flow studies pending        Blood glucose pattern                Assessment and Plan        Nursing Diagnosis  Risk for unstable blood glucose pattern     Nursing Intervention Domain  5250 Decision-making Support        Nursing Interventions  Examined current inpatient diabetes control    Explored factors facilitating and impeding inpatient management   Identified self-management practices impeding diabetes control   Explored corrective strategies with patient and responsible inpatient provider    Informed patient of rational for insulin  strategy while hospitalized      Instructed patient in importance of checking blood glucose DAILY before meals - discussed when she should check her BG after meals-2hours          Evaluation     [name], with Type 2 diabetes, did not achieve diabetes control prior to admission, as evidenced by admission BG of 170 and A1c of 81%.  She is followed closely by her PCP  for continued diabetes management.  She last saw PCP via virtual visit in Feb. 2021. Her A1C is improved since July 2020 when it was >11%. She is concerned about the fate of  her left foot, but is encouraged that her A1C has lowered significantly since  July 2020.        Noted hypoglycemia yesterday and this AM.  Would recommend decreasing basal insulin  to decrease risk of hypoglycemia     Recommendations        [x]  Use of  Subcutaneous Insulin  Order set [id number])       Insulin   Dosing  Specific recommendation         ADJUST  Basal                                      (Based on weight, BMI & GFR)  16 units daily (20% reduction)  On 11/27/2019- REDUCE  basal insulin  to 10units since will be NPO for surgery on 11/28/2019         CONTINUE Corrective                                       (Useful in adjusting insulin  dosing)  [x]   Normal sensitivity                     Billing Code(s)     [x]  99231 IP subsequent hospital  care - 15 minutes      Before making these care recommendations, I personally reviewed the hospitalization record, including notes, laboratory & diagnostic data and current  medications, and    examined the patient at the bedside (circumstances permitting) before making care recommendations.      Total minutes: 15      Rosaline DELENA Bangs, CNS   Diabetes Clinical Nurse Specialist   [facility]   Access via Perfect Serve

## 2019-11-27 NOTE — Progress Notes (Cosign Needed Addendum)
Hospitalist Progress Note          [name], NP  Please call operator and page for questions.  Call physician on-call through the operator 7pm-7am    Daily Progress Note: 11/27/2019    Primary care provider:Ivey, Gae Gallop, NP    Date of admission: 11/23/2019 12:32 PM    Admission Summary and Hospital Course:      From H&P 11/23/2019:  "Delynn Kulka is a 69 y.o. female with past medical history of diabetes, insulin-dependent, GERD and hypertension comes for return of left foot pain and swelling.  Patient is awake, alert and oriented able to answer my question follow my request, data also obtained in the ED staff and extensive chart review.  As per collective reports, patient left great toe pain which associated darkening of the skin on the distal end of the toe and swelling of the left foot the medial aspect of the Of the last 1 week and has been progressively getting worse.  The only thing that he remembers was fungal nail cream.  She reported since her left second toe is in contact with the first toe and that pain he also became slightly cellulitic, she also has some dorsal swelling of the foot as well.  This all progress from being mildly achy to significant tender and swollen last 1 week and she came today because the pain was unbearable, 10/10, getting from her toe to her foot and made her ambulation significantly difficult.  Although she continues to have pain at rest.  She has not seek medical help for that until today, did not remember any trauma or injury to her feet.  She is hemodynamically otherwise stable in the ED on her own.  Patient was hemodynamically stable in the ED, and work revealed hypokalemia leukocytosis and x-ray the foot was without any acute abnormality.  Given concerns for dry gangrene and cellulitis, she was started on IV vancomycin. Patient has no other medical complaints.  Patient will be admitted to hospitalist service for evaluation management."     Subjective:   F/U for left foot cellulitis, left great toe gangrene, DMII, HTN, GERD, Hypokalemia, left popliteal occlusion    Pt seen today in mild distress due to pain. She reports that since her tests yesterday, her leg pain has worsened significantly. She also reports that Dr. Timmothy Sours wants her to have a BM before surgery tomorrow. She doesn't feel constipated but hasn't had a BM since Sunday or Monday. Otherwise has no complaints.     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  HTN  -Controlled  -Cont home meds   ??  GERD  -Cont PPI  ??  Hypokalemia  -Resolved    DVTppx: SCDs, lovenox held for surg  Gippx: Protonix  Code Status: Full code  Diet: Diabetic, NPO after MN  Activity: OOB to chair TID and PRN  Discharge: Plan to discharge home pending progress and ultimate plan; for OR tomorrow.           Review of Systems:     Full ROS complete with pertinent positives and negatives as per HPI, otherwise negative  Objective:  Physical Exam:     Visit Vitals  BP 129/75 (BP 1 Location: Right upper arm, BP Patient Position: At rest)   Pulse 72   Temp 98 ??F (36.7 ??C)   Resp 17   Ht 5\' 5"  (1.651 m)   Wt 81.6 kg (180 lb)   SpO2 97%   BMI 29.95 kg/m??    O2 Flow Rate (L/min): 2 l/min O2 Device: Room air    Temp (24hrs), Avg:98 ??F (36.7 ??C), Min:97.6 ??F (36.4 ??C), Max:98.3 ??F (36.8 ??C)    03/10 0701 - 03/10 1900  In: 320 [P.O.:320]  Out: -    No intake/output data recorded.      General:  Alert, cooperative, no distress, appears stated age.   Lungs:   Clear to auscultation bilaterally.   Heart:  Regular rate and rhythm, S1, S2 normal, no murmur, click, rub or gallop.   Abdomen:   Soft, non-tender, non-distended. Bowel sounds normal.    Extremities: Extremities  normal, atraumatic, no cyanosis. LLE 3+ edema   Skin: Skin color, texture, turgor normal. LLE medial large scabbed ulcer; right great toe erythematous, swollen, small area dry gangrene noted   Neurologic: CNII-XII intact. MAE, A&Ox4     Data Review:       Recent Days:  Recent Labs     11/27/19  0500 11/26/19  0449 11/25/19  0458   WBC 8.0 7.5 7.5   HGB 11.2* 10.8* 11.4*   HCT 32.6* 30.9* 33.1*   PLT 407* 372 378     Recent Labs     11/27/19  0500 11/26/19  0449 11/25/19  0458   NA 137 136 137   K 3.2* 3.2* 3.6   CL 103 103 107   CO2 28 27 26    GLU 66 76 70   BUN 13 12 12    CREA 0.74 0.79 0.68   CA 9.7 10.1 10.2*     No results for input(s): PH, PCO2, PO2, HCO3, FIO2 in the last 72 hours.    24 Hour Results:  Recent Results (from the past 24 hour(s))   GLUCOSE, POC    Collection Time: 11/26/19  4:20 PM   Result Value Ref Range    Glucose (POC) 165 (H) 65 - 100 mg/dL    Performed by Sandi Raveling    GLUCOSE, POC    Collection Time: 11/26/19  9:22 PM   Result Value Ref Range    Glucose (POC) 99 65 - 100 mg/dL    Performed by Norlene Duel    CBC WITH AUTOMATED DIFF    Collection Time: 11/27/19  5:00 AM   Result Value Ref Range    WBC 8.0 3.6 - 11.0 K/uL    RBC 3.62 (L) 3.80 - 5.20 M/uL    HGB 11.2 (L) 11.5 - 16.0 g/dL    HCT 32.6 (L) 35.0 - 47.0 %    MCV 90.1 80.0 - 99.0 FL    MCH 30.9 26.0 - 34.0 PG    MCHC 34.4 30.0 - 36.5 g/dL    RDW 12.7 11.5 - 14.5 %    PLATELET 407 (H) 150 - 400 K/uL    MPV 10.4 8.9 - 12.9 FL    NRBC 0.0 0 PER 100 WBC    ABSOLUTE NRBC 0.00 0.00 - 0.01 K/uL    NEUTROPHILS 63 32 - 75 %    LYMPHOCYTES 23 12 - 49 %    MONOCYTES 8 5 - 13 %    EOSINOPHILS  5 0 - 7 %    BASOPHILS 1 0 - 1 %    IMMATURE GRANULOCYTES 0 0.0 - 0.5 %    ABS. NEUTROPHILS 5.1 1.8 - 8.0 K/UL    ABS. LYMPHOCYTES 1.8 0.8 - 3.5 K/UL    ABS. MONOCYTES 0.7 0.0 - 1.0 K/UL    ABS. EOSINOPHILS 0.4 0.0 - 0.4 K/UL    ABS. BASOPHILS 0.0 0.0 - 0.1 K/UL    ABS. IMM. GRANS. 0.0 0.00 - 0.04 K/UL    DF AUTOMATED     METABOLIC PANEL, BASIC     Collection Time: 11/27/19  5:00 AM   Result Value Ref Range    Sodium 137 136 - 145 mmol/L    Potassium 3.2 (L) 3.5 - 5.1 mmol/L    Chloride 103 97 - 108 mmol/L    CO2 28 21 - 32 mmol/L    Anion gap 6 5 - 15 mmol/L    Glucose 66 65 - 100 mg/dL    BUN 13 6 - 20 MG/DL    Creatinine 0.74 0.55 - 1.02 MG/DL    BUN/Creatinine ratio 18 12 - 20      GFR est AA >60 >60 ml/min/1.19m2    GFR est non-AA >60 >60 ml/min/1.64m2    Calcium 9.7 8.5 - 10.1 MG/DL   GLUCOSE, POC    Collection Time: 11/27/19  7:03 AM   Result Value Ref Range    Glucose (POC) 71 65 - 100 mg/dL    Performed by Malva Cogan    GLUCOSE, POC    Collection Time: 11/27/19 11:08 AM   Result Value Ref Range    Glucose (POC) 153 (H) 65 - 100 mg/dL    Performed by Drue Flirt PCT        Problem List:  Problem List as of 11/27/2019 Date Reviewed: Jun 03, 2018          Codes Class Noted - Resolved    Gangrene ([place]) ICD-10-CM: [id number]  ICD-9-CM: 785.4  11/23/2019 - Present        Type 2 diabetes mellitus with diabetic neuropathy ([place]) ICD-10-CM: E11.40  ICD-9-CM: 250.60, 357.2  10/30/2018 - Present        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-CM: E11.21  ICD-9-CM: 250.40, 583.81  02/23/2017 - Present        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  ICD-9-CM: 365.9  06/03/2016 - Present        Cataract of left eye ICD-10-CM: H26.9  ICD-9-CM: 366.9  06/03/2016 - Present        Postmenopausal ICD-10-CM: Z78.0  ICD-9-CM: V49.81  02/18/2014 - Present        Constipation ICD-10-CM: K59.00  ICD-9-CM: 564.00  01/03/2013 - Present        HTN (hypertension) ICD-10-CM: I10  ICD-9-CM: 401.9  12/11/2012 - Present        GERD (gastroesophageal reflux disease) ICD-10-CM: K21.9  ICD-9-CM: 530.81  12/11/2012 - Present        Hypercholesterolemia ICD-10-CM: E78.00  ICD-9-CM: 272.0  12/11/2012 - Present        Diabetes ([place]) ICD-10-CM: E11.9  ICD-9-CM: 250.00  12/11/2012 - Present              Medications reviewed  Current Facility-Administered Medications   Medication Dose Route Frequency   ???  bisacodyL (DULCOLAX) suppository 10 mg  10 mg Rectal DAILY PRN   ??? sodium phosphate (FLEET'S) enema 1 Enema  1 Enema Rectal PRN   ??? oxyCODONE IR (ROXICODONE)  tablet 5 mg  5 mg Oral Q4H PRN   ??? oxyCODONE IR (ROXICODONE) tablet 10 mg  10 mg Oral Q4H PRN   ??? lactulose (CHRONULAC) 10 gram/15 mL solution 45 mL  45 mL Oral Q2H   ??? traMADoL (ULTRAM) tablet 50 mg  50 mg Oral Q4H PRN   ??? insulin glargine (LANTUS) injection 16 Units  16 Units SubCUTAneous QHS   ??? Vancomycin- Pharmacy to Dose   Other Rx Dosing/Monitoring   ??? vancomycin (VANCOCIN) 1,000 mg in 0.9% sodium chloride 250 mL (VIAL-MATE)  1,000 mg IntraVENous Q12H   ??? gabapentin (NEURONTIN) capsule 300 mg  300 mg Oral QHS   ??? losartan (COZAAR) tablet 100 mg  100 mg Oral DAILY   ??? morphine injection 1 mg  1 mg IntraVENous Q4H PRN   ??? docusate sodium (COLACE) capsule 100 mg  100 mg Oral BID   ??? senna (SENOKOT) tablet 8.6 mg  1 Tab Oral DAILY   ??? polyethylene glycol (MIRALAX) packet 17 g  17 g Oral DAILY PRN   ??? bisacodyL (DULCOLAX) suppository 10 mg  10 mg Rectal DAILY PRN   ??? hydroCHLOROthiazide (HYDRODIURIL) tablet 25 mg  25 mg Oral DAILY   ??? sodium chloride (NS) flush 5-40 mL  5-40 mL IntraVENous Q8H   ??? sodium chloride (NS) flush 5-40 mL  5-40 mL IntraVENous PRN   ??? acetaminophen (TYLENOL) tablet 650 mg  650 mg Oral Q6H PRN    Or   ??? acetaminophen (TYLENOL) suppository 650 mg  650 mg Rectal Q6H PRN   ??? polyethylene glycol (MIRALAX) packet 17 g  17 g Oral DAILY PRN   ??? promethazine (PHENERGAN) tablet 12.5 mg  12.5 mg Oral Q6H PRN    Or   ??? ondansetron (ZOFRAN) injection 4 mg  4 mg IntraVENous Q6H PRN   ??? glucose chewable tablet 16 g  4 Tab Oral PRN   ??? dextrose (D50W) injection syrg 12.5-25 g  25-50 mL IntraVENous PRN   ??? glucagon (GLUCAGEN) injection 1 mg  1 mg IntraMUSCular PRN   ??? insulin lispro (HUMALOG) injection   SubCUTAneous AC&HS   ??? aspirin delayed-release tablet 81 mg  81 mg Oral DAILY   ??? pantoprazole (PROTONIX) tablet 40 mg  40 mg Oral ACB   ???  amLODIPine (NORVASC) tablet 10 mg  10 mg Oral DAILY   ??? [Held by provider] enoxaparin (LOVENOX) injection 40 mg  40 mg SubCUTAneous Q24H       Care Plan discussed with: Patient/family, nurse      [name], NP  Hospitalist  Providers can reach me on PerfectServe

## 2019-11-28 DIAGNOSIS — J301 Allergic rhinitis due to pollen: Secondary | ICD-10-CM | POA: Diagnosis not present

## 2019-11-28 DIAGNOSIS — J3089 Other allergic rhinitis: Secondary | ICD-10-CM | POA: Diagnosis not present

## 2019-11-28 LAB — CULTURE, BLOOD, PAIRED
Culture result:: NO GROWTH
Culture: NO GROWTH

## 2019-11-28 LAB — CBC WITH AUTOMATED DIFF
ABS. BASOPHILS: 0 10*3/uL (ref 0.0–0.1)
ABS. EOSINOPHILS: 0.4 10*3/uL (ref 0.0–0.4)
ABS. IMM. GRANS.: 0 10*3/uL (ref 0.00–0.04)
ABS. LYMPHOCYTES: 1.2 10*3/uL (ref 0.8–3.5)
ABS. MONOCYTES: 0.6 10*3/uL (ref 0.0–1.0)
ABS. NEUTROPHILS: 6.2 10*3/uL (ref 1.8–8.0)
ABSOLUTE NRBC: 0 10*3/uL (ref 0.00–0.01)
BASOPHILS: 1 % (ref 0–1)
EOSINOPHILS: 4 % (ref 0–7)
HCT: 33.8 % — ABNORMAL LOW (ref 35.0–47.0)
HGB: 11.4 g/dL — ABNORMAL LOW (ref 11.5–16.0)
IMMATURE GRANULOCYTES: 0 % (ref 0.0–0.5)
LYMPHOCYTES: 14 % (ref 12–49)
MCH: 31.5 PG (ref 26.0–34.0)
MCHC: 33.7 g/dL (ref 30.0–36.5)
MCV: 93.4 FL (ref 80.0–99.0)
MONOCYTES: 8 % (ref 5–13)
MPV: 10.3 FL (ref 8.9–12.9)
NEUTROPHILS: 73 % (ref 32–75)
NRBC: 0 PER 100 WBC
PLATELET: 393 10*3/uL (ref 150–400)
RBC: 3.62 M/uL — ABNORMAL LOW (ref 3.80–5.20)
RDW: 12.7 % (ref 11.5–14.5)
WBC: 8.4 10*3/uL (ref 3.6–11.0)

## 2019-11-28 LAB — GLUCOSE, POC
Glucose (POC): 184 mg/dL — ABNORMAL HIGH (ref 65–100)
Glucose (POC): 116 mg/dL — ABNORMAL HIGH (ref 65–100)
Glucose (POC): 78 mg/dL (ref 65–100)
Glucose (POC): 138 mg/dL — ABNORMAL HIGH (ref 65–100)

## 2019-11-28 LAB — METABOLIC PANEL, BASIC
Anion gap: 9 mmol/L (ref 5–15)
BUN/Creatinine ratio: 15 (ref 12–20)
BUN: 15 MG/DL (ref 6–20)
CO2: 25 mmol/L (ref 21–32)
Calcium: 10.4 MG/DL — ABNORMAL HIGH (ref 8.5–10.1)
Chloride: 105 mmol/L (ref 97–108)
Creatinine: 0.97 MG/DL (ref 0.55–1.02)
GFR est AA: >60 mL/min/{1.73_m2} (ref >60)
GFR est non-AA: 57 mL/min/{1.73_m2} — ABNORMAL LOW (ref >60)
Glucose: 124 mg/dL — ABNORMAL HIGH (ref 65–100)
Potassium: 3.5 mmol/L (ref 3.5–5.1)
Sodium: 139 mmol/L (ref 136–145)

## 2019-11-28 LAB — VANCOMYCIN, TROUGH: Vancomycin,trough: 18.4 ug/mL — ABNORMAL HIGH (ref 5.0–10.0)

## 2019-11-28 LAB — BASIC METABOLIC PANEL
Anion Gap: 9 mmol/L (ref 5–15)
BUN/Creatinine Ratio: 15 (ref 12–20)
BUN: 15 MG/DL (ref 6–20)
CO2: 25 mmol/L (ref 21–32)
Calcium: 10.4 MG/DL — ABNORMAL HIGH (ref 8.5–10.1)
Chloride: 105 mmol/L (ref 97–108)
Creatinine: 0.97 MG/DL (ref 0.55–1.02)
GFR African American: >60 mL/min/{1.73_m2} (ref >60)
Glucose: 124 mg/dL — ABNORMAL HIGH (ref 65–100)
Potassium: 3.5 mmol/L (ref 3.5–5.1)
Sodium: 139 mmol/L (ref 136–145)
eGFR NON-AA: 57 mL/min/{1.73_m2} — ABNORMAL LOW (ref >60)

## 2019-11-28 LAB — CBC WITH AUTO DIFFERENTIAL
Basophils %: 1 % (ref 0–1)
Basophils Absolute: 0 10*3/uL (ref 0.0–0.1)
Eosinophils %: 4 % (ref 0–7)
Eosinophils Absolute: 0.4 10*3/uL (ref 0.0–0.4)
Granulocyte Absolute Count: 0 10*3/uL (ref 0.00–0.04)
Hematocrit: 33.8 % — ABNORMAL LOW (ref 35.0–47.0)
Hemoglobin: 11.4 g/dL — ABNORMAL LOW (ref 11.5–16.0)
Immature Granulocytes %: 0 % (ref 0.0–0.5)
Lymphocytes %: 14 % (ref 12–49)
Lymphocytes Absolute: 1.2 10*3/uL (ref 0.8–3.5)
MCH: 31.5 PG (ref 26.0–34.0)
MCHC: 33.7 g/dL (ref 30.0–36.5)
MCV: 93.4 FL (ref 80.0–99.0)
MPV: 10.3 FL (ref 8.9–12.9)
Monocytes %: 8 % (ref 5–13)
Monocytes Absolute: 0.6 10*3/uL (ref 0.0–1.0)
NRBC Absolute: 0 10*3/uL (ref 0.00–0.01)
Neutrophils %: 73 % (ref 32–75)
Neutrophils Absolute: 6.2 10*3/uL (ref 1.8–8.0)
Nucleated RBCs: 0 PER 100 WBC
Platelets: 393 10*3/uL (ref 150–400)
RBC: 3.62 M/uL — ABNORMAL LOW (ref 3.80–5.20)
RDW: 12.7 % (ref 11.5–14.5)
WBC: 8.4 10*3/uL (ref 3.6–11.0)

## 2019-11-28 LAB — VANCOMYCIN TROUGH: Vancomycin Tr: 18.4 ug/mL — ABNORMAL HIGH (ref 5.0–10.0)

## 2019-11-28 LAB — POCT GLUCOSE
POC Glucose: 184 mg/dL — ABNORMAL HIGH (ref 65–100)
POC Glucose: 116 mg/dL — ABNORMAL HIGH (ref 65–100)
POC Glucose: 78 mg/dL (ref 65–100)
POC Glucose: 138 mg/dL — ABNORMAL HIGH (ref 65–100)

## 2019-11-28 MED ORDER — FENTANYL CITRATE (PF) 50 MCG/ML IJ SOLN
50 mcg/mL | INTRAMUSCULAR | Status: DC | PRN
Start: 2019-11-28 — End: 2019-11-28
  Administered 2019-11-28 (×2): via INTRAVENOUS

## 2019-11-28 MED ORDER — MORPHINE 2 MG/ML INJECTION
2 mg/mL | INTRAMUSCULAR | Status: DC | PRN
Start: 2019-11-28 — End: 2019-11-28

## 2019-11-28 MED ORDER — SODIUM CHLORIDE 0.9 % IJ SYRG
Freq: Three times a day (TID) | INTRAMUSCULAR | Status: DC
Start: 2019-11-28 — End: 2019-11-28
  Administered 2019-11-28: 21:00:00 via INTRAVENOUS

## 2019-11-28 MED ORDER — MIDAZOLAM 1 MG/ML IJ SOLN
1 mg/mL | INTRAMUSCULAR | Status: AC
Start: 2019-11-28 — End: ?

## 2019-11-28 MED ORDER — LACTATED RINGERS IV
INTRAVENOUS | Status: DC
Start: 2019-11-28 — End: 2019-11-28

## 2019-11-28 MED ORDER — SODIUM CHLORIDE 0.9 % IJ SYRG
INTRAMUSCULAR | Status: DC | PRN
Start: 2019-11-28 — End: 2019-11-28

## 2019-11-28 MED ORDER — HYDROMORPHONE (PF) 1 MG/ML IJ SOLN
1 mg/mL | INTRAMUSCULAR | Status: DC | PRN
Start: 2019-11-28 — End: 2019-11-28

## 2019-11-28 MED ORDER — ROPIVACAINE (PF) 5 MG/ML (0.5 %) INJECTION
5 mg/mL (0. %) | Freq: Once | INTRAMUSCULAR | Status: DC
Start: 2019-11-28 — End: 2019-11-28
  Administered 2019-11-28: 16:00:00 via PERINEURAL

## 2019-11-28 MED ORDER — PHENYLEPHRINE IN 0.9 % SODIUM CL (40 MCG/ML) IV SYRINGE
0.4 mg/10 mL (40 mcg/mL) | INTRAVENOUS | Status: AC
Start: 2019-11-28 — End: ?

## 2019-11-28 MED ORDER — ACETAMINOPHEN 325 MG TABLET
325 mg | Freq: Once | ORAL | Status: DC
Start: 2019-11-28 — End: 2019-11-28
  Administered 2019-11-28: 16:00:00 via ORAL

## 2019-11-28 MED ORDER — PROPOFOL 10 MG/ML IV EMUL
10 mg/mL | INTRAVENOUS | Status: DC | PRN
Start: 2019-11-28 — End: 2019-11-28
  Administered 2019-11-28: 17:00:00 via INTRAVENOUS

## 2019-11-28 MED ORDER — MORPHINE 2 MG/ML INJECTION
2 mg/mL | INTRAMUSCULAR | Status: DC | PRN
Start: 2019-11-28 — End: 2019-11-29
  Administered 2019-11-28 – 2019-11-29 (×6): via INTRAVENOUS

## 2019-11-28 MED ORDER — SUCCINYLCHOLINE CHLORIDE 20 MG/ML INJECTION
20 mg/mL | INTRAMUSCULAR | Status: DC | PRN
Start: 2019-11-28 — End: 2019-11-28
  Administered 2019-11-28: 17:00:00 via INTRAVENOUS

## 2019-11-28 MED ORDER — ONDANSETRON (PF) 4 MG/2 ML INJECTION
4 mg/2 mL | INTRAMUSCULAR | Status: DC | PRN
Start: 2019-11-28 — End: 2019-11-28

## 2019-11-28 MED ORDER — HEPARIN (PORCINE) 1,000 UNIT/ML IJ SOLN
1000 unit/mL | INTRAMUSCULAR | Status: DC | PRN
Start: 2019-11-28 — End: 2019-11-28
  Administered 2019-11-28: 18:00:00 via INTRAVENOUS

## 2019-11-28 MED ORDER — MEPERIDINE (PF) 25 MG/ML INJ SOLUTION
25 mg/ml | Freq: Once | INTRAMUSCULAR | Status: DC
Start: 2019-11-28 — End: 2019-11-28
  Administered 2019-11-28: 20:00:00 via INTRAVENOUS

## 2019-11-28 MED ORDER — SODIUM CHLORIDE 0.9 % IV
1000 unit/mL | INTRAVENOUS | Status: DC | PRN
Start: 2019-11-28 — End: 2019-11-28
  Administered 2019-11-28: 17:00:00

## 2019-11-28 MED ORDER — LIDOCAINE (PF) 10 MG/ML (1 %) IJ SOLN
10 mg/mL (1 %) | INTRAMUSCULAR | Status: DC | PRN
Start: 2019-11-28 — End: 2019-11-28

## 2019-11-28 MED ORDER — MIDAZOLAM 1 MG/ML IJ SOLN
1 mg/mL | INTRAMUSCULAR | Status: DC | PRN
Start: 2019-11-28 — End: 2019-11-28
  Administered 2019-11-28: 16:00:00 via INTRAVENOUS

## 2019-11-28 MED ORDER — MIDAZOLAM 1 MG/ML IJ SOLN
1 mg/mL | INTRAMUSCULAR | Status: DC | PRN
Start: 2019-11-28 — End: 2019-11-28

## 2019-11-28 MED ORDER — SODIUM CHLORIDE 0.9 % IV
INTRAVENOUS | Status: DC
Start: 2019-11-28 — End: 2019-11-28
  Administered 2019-11-28: 16:00:00 via INTRAVENOUS

## 2019-11-28 MED ORDER — SODIUM CHLORIDE 0.9 % IJ SYRG
Freq: Three times a day (TID) | INTRAMUSCULAR | Status: DC
Start: 2019-11-28 — End: 2019-11-28
  Administered 2019-11-28: 19:00:00 via INTRAVENOUS

## 2019-11-28 MED ORDER — FENTANYL CITRATE (PF) 50 MCG/ML IJ SOLN
50 mcg/mL | INTRAMUSCULAR | Status: DC | PRN
Start: 2019-11-28 — End: 2019-11-28
  Administered 2019-11-28 (×4): via INTRAVENOUS

## 2019-11-28 MED ORDER — ONDANSETRON (PF) 4 MG/2 ML INJECTION
4 mg/2 mL | INTRAMUSCULAR | Status: AC
Start: 2019-11-28 — End: ?

## 2019-11-28 MED ORDER — ROCURONIUM 10 MG/ML IV
10 mg/mL | INTRAVENOUS | Status: DC | PRN
Start: 2019-11-28 — End: 2019-11-28
  Administered 2019-11-28 (×2): via INTRAVENOUS

## 2019-11-28 MED ORDER — LACTATED RINGERS IV
INTRAVENOUS | Status: DC
Start: 2019-11-28 — End: 2019-11-28
  Administered 2019-11-28: 16:00:00 via INTRAVENOUS

## 2019-11-28 MED ORDER — SODIUM CHLORIDE 0.9 % IV
INTRAVENOUS | Status: DC | PRN
Start: 2019-11-28 — End: 2019-11-28
  Administered 2019-11-28: 17:00:00 via INTRAVENOUS

## 2019-11-28 MED ORDER — VIAL-MATE ADAPTOR
1000 mg | Status: DC
Start: 2019-11-28 — End: 2019-12-01
  Administered 2019-11-29 – 2019-11-30 (×3): via INTRAVENOUS

## 2019-11-28 MED ORDER — HEPARIN (PORCINE) 1,000 UNIT/ML IJ SOLN
1000 unit/mL | INTRAMUSCULAR | Status: AC
Start: 2019-11-28 — End: ?

## 2019-11-28 MED ORDER — PHENYLEPHRINE 10 MG/ML INJECTION
10 mg/mL | INTRAMUSCULAR | Status: AC
Start: 2019-11-28 — End: ?

## 2019-11-28 MED ORDER — PHENYLEPHRINE IN 0.9 % SODIUM CL (40 MCG/ML) IV SYRINGE
0.4 mg/10 mL (40 mcg/mL) | INTRAVENOUS | Status: DC | PRN
Start: 2019-11-28 — End: 2019-11-28
  Administered 2019-11-28 (×3): via INTRAVENOUS

## 2019-11-28 MED ORDER — PHARMACY VANCOMYCIN NOTE
Freq: Once | Status: AC
Start: 2019-11-28 — End: 2019-11-28
  Administered 2019-11-28: 22:00:00

## 2019-11-28 MED ORDER — FENTANYL CITRATE (PF) 50 MCG/ML IJ SOLN
50 mcg/mL | INTRAMUSCULAR | Status: AC
Start: 2019-11-28 — End: ?

## 2019-11-28 MED ORDER — SODIUM CHLORIDE 0.9 % IJ SYRG
Freq: Three times a day (TID) | INTRAMUSCULAR | Status: DC
Start: 2019-11-28 — End: 2019-12-18
  Administered 2019-11-29 – 2019-12-18 (×53): via INTRAVENOUS

## 2019-11-28 MED ORDER — GLYCOPYRROLATE(PF) 0.4 MG/2 ML (0.2 MG/ML) IN STERILE WATER IV SYRINGE
0.4 mg/2 mL (0.2 mg/mL) | INTRAVENOUS | Status: AC
Start: 2019-11-28 — End: ?

## 2019-11-28 MED ORDER — SODIUM CHLORIDE 0.9 % IV
INTRAVENOUS | Status: DC
Start: 2019-11-28 — End: 2019-11-29
  Administered 2019-11-28: 21:00:00 via INTRAVENOUS

## 2019-11-28 MED ORDER — LIDOCAINE (PF) 20 MG/ML (2 %) IJ SOLN
20 mg/mL (2 %) | INTRAMUSCULAR | Status: DC | PRN
Start: 2019-11-28 — End: 2019-11-28
  Administered 2019-11-28: 17:00:00 via INTRAVENOUS

## 2019-11-28 MED ORDER — ONDANSETRON (PF) 4 MG/2 ML INJECTION
4 mg/2 mL | INTRAMUSCULAR | Status: DC | PRN
Start: 2019-11-28 — End: 2019-11-28
  Administered 2019-11-28: 19:00:00 via INTRAVENOUS

## 2019-11-28 MED ORDER — SODIUM CHLORIDE 0.9 % IJ SYRG
INTRAMUSCULAR | Status: DC | PRN
Start: 2019-11-28 — End: 2019-12-18

## 2019-11-28 MED ORDER — FENTANYL CITRATE (PF) 50 MCG/ML IJ SOLN
50 mcg/mL | INTRAMUSCULAR | Status: DC | PRN
Start: 2019-11-28 — End: 2019-11-28

## 2019-11-28 MED ORDER — LACTATED RINGERS IV
INTRAVENOUS | Status: DC | PRN
Start: 2019-11-28 — End: 2019-11-28
  Administered 2019-11-28: 16:00:00 via INTRAVENOUS

## 2019-11-28 MED ORDER — SODIUM CHLORIDE 0.9 % INJECTION
INTRAMUSCULAR | Status: AC
Start: 2019-11-28 — End: ?

## 2019-11-28 MED ORDER — NEOSTIGMINE METHYLSULFATE 3 MG/3 ML (1 MG/ML) IV SYRINGE
3 mg/ mL (1 mg/mL) | INTRAVENOUS | Status: AC
Start: 2019-11-28 — End: ?

## 2019-11-28 MED ORDER — SODIUM CHLORIDE 0.9 % IV
10 mg/mL | INTRAVENOUS | Status: DC | PRN
Start: 2019-11-28 — End: 2019-11-28
  Administered 2019-11-28 (×4): via INTRAVENOUS

## 2019-11-28 MED ORDER — DIPHENHYDRAMINE HCL 50 MG/ML IJ SOLN
50 mg/mL | INTRAMUSCULAR | Status: DC | PRN
Start: 2019-11-28 — End: 2019-11-28

## 2019-11-28 MED ORDER — LIDOCAINE (PF) 20 MG/ML (2 %) IJ SOLN
20 mg/mL (2 %) | INTRAMUSCULAR | Status: AC
Start: 2019-11-28 — End: ?

## 2019-11-28 MED ORDER — SODIUM CHLORIDE 0.9 % IV
INTRAVENOUS | Status: DC
Start: 2019-11-28 — End: 2019-11-28
  Administered 2019-11-28: 20:00:00 via INTRAVENOUS

## 2019-11-28 MED FILL — OXYCODONE 5 MG TAB: 5 mg | ORAL | Qty: 1

## 2019-11-28 MED FILL — DOK 100 MG CAPSULE: 100 mg | ORAL | Qty: 1

## 2019-11-28 MED FILL — INSULIN GLARGINE 100 UNIT/ML INJECTION: 100 unit/mL | SUBCUTANEOUS | Qty: 1

## 2019-11-28 MED FILL — HEPARIN (PORCINE) 1,000 UNIT/ML IJ SOLN: 1000 unit/mL | INTRAMUSCULAR | Qty: 2

## 2019-11-28 MED FILL — NORMAL SALINE FLUSH 0.9 % INJECTION SYRINGE: INTRAMUSCULAR | Qty: 40

## 2019-11-28 MED FILL — MORPHINE 2 MG/ML INJECTION: 2 mg/mL | INTRAMUSCULAR | Qty: 2

## 2019-11-28 MED FILL — GABAPENTIN 300 MG CAP: 300 mg | ORAL | Qty: 1

## 2019-11-28 MED FILL — NEOSTIGMINE METHYLSULFATE 3 MG/3 ML (1 MG/ML) IV SYRINGE: 3 mg/ mL (1 mg/mL) | INTRAVENOUS | Qty: 9

## 2019-11-28 MED FILL — PHENYLEPHRINE 10 MG/ML INJECTION: 10 mg/mL | INTRAMUSCULAR | Qty: 1

## 2019-11-28 MED FILL — OXYCODONE 5 MG TAB: 5 mg | ORAL | Qty: 2

## 2019-11-28 MED FILL — LACTATED RINGERS IV: INTRAVENOUS | Qty: 1000

## 2019-11-28 MED FILL — ONDANSETRON (PF) 4 MG/2 ML INJECTION: 4 mg/2 mL | INTRAMUSCULAR | Qty: 2

## 2019-11-28 MED FILL — PHENYLEPHRINE IN 0.9 % SODIUM CL (40 MCG/ML) IV SYRINGE: 0.4 mg/10 mL (40 mcg/mL) | INTRAVENOUS | Qty: 10

## 2019-11-28 MED FILL — MIDAZOLAM 1 MG/ML IJ SOLN: 1 mg/mL | INTRAMUSCULAR | Qty: 2

## 2019-11-28 MED FILL — SODIUM CHLORIDE 0.9 % INJECTION: INTRAMUSCULAR | Qty: 10

## 2019-11-28 MED FILL — SODIUM CHLORIDE 0.9 % IV: INTRAVENOUS | Qty: 1000

## 2019-11-28 MED FILL — FENTANYL CITRATE (PF) 50 MCG/ML IJ SOLN: 50 mcg/mL | INTRAMUSCULAR | Qty: 5

## 2019-11-28 MED FILL — XYLOCAINE-MPF 20 MG/ML (2 %) INJECTION SOLUTION: 20 mg/mL (2 %) | INTRAMUSCULAR | Qty: 5

## 2019-11-28 MED FILL — HEPARIN (PORCINE) 1,000 UNIT/ML IJ SOLN: 1000 unit/mL | INTRAMUSCULAR | Qty: 10

## 2019-11-28 MED FILL — PHARMACY VANCOMYCIN NOTE: Qty: 1

## 2019-11-28 MED FILL — LACTULOSE 20 GRAM/30 ML ORAL SOLUTION: 20 gram/30 mL | ORAL | Qty: 60

## 2019-11-28 MED FILL — FENTANYL CITRATE (PF) 50 MCG/ML IJ SOLN: 50 mcg/mL | INTRAMUSCULAR | Qty: 2

## 2019-11-28 MED FILL — VANCOMYCIN 1,000 MG IV SOLR: 1000 mg | INTRAVENOUS | Qty: 1000

## 2019-11-28 MED FILL — GLYCOPYRROLATE(PF) 0.4 MG/2 ML (0.2 MG/ML) IN STERILE WATER IV SYRINGE: 0.4 mg/2 mL (0.2 mg/mL) | INTRAVENOUS | Qty: 2

## 2019-11-28 MED FILL — PANTOPRAZOLE 40 MG TAB, DELAYED RELEASE: 40 mg | ORAL | Qty: 1

## 2019-11-28 NOTE — Anesthesia Pre-Procedure Evaluation (Signed)
Relevant Problems   CARDIOVASCULAR   (+) HTN (hypertension)      GASTROINTESTINAL   (+) GERD (gastroesophageal reflux disease)      ENDOCRINE   (+) Diabetes (HCC)   (+) Type 2 diabetes mellitus with diabetic neuropathy (HCC)   (+) Type 2 diabetes with nephropathy (HCC)       Anesthetic History   No history of anesthetic complications            Review of Systems / Medical History  Patient summary reviewed, nursing notes reviewed and pertinent labs reviewed    Pulmonary  Within defined limits                 Neuro/Psych   Within defined limits           Cardiovascular    Hypertension          PAD         GI/Hepatic/Renal  Within defined limits   GERD           Endo/Other  Within defined limits  Diabetes         Other Findings              Physical Exam    Airway  Mallampati: II  TM Distance: > 6 cm  Neck ROM: normal range of motion   Mouth opening: Normal     Cardiovascular  Regular rate and rhythm,  S1 and S2 normal,  no murmur, click, rub, or gallop             Dental  No notable dental hx       Pulmonary  Breath sounds clear to auscultation               Abdominal  GI exam deferred       Other Findings            Anesthetic Plan    ASA: 3  Anesthesia type: general            Anesthetic plan and risks discussed with: Patient

## 2019-11-28 NOTE — Other (Signed)
TRANSFER - OUT REPORT:    Verbal report given to Dan,RN(name) on Lindsey Huynh  being transferred to 558(unit) for routine post - op       Report consisted of patient???s Situation, Background, Assessment and   Recommendations(SBAR).     Time Pre op antibiotic given:scheduled  Anesthesia Stop time: 14:47  Foley Present on Transfer to floor:yesyes  Order for Foley on Chart:yes  Discharge Prescriptions with Chart:no    Information from the following report(s) SBAR, Kardex, OR Summary, Procedure Summary, Intake/Output, MAR, Recent Results, Med Rec Status, Cardiac Rhythm SR and Alarm Parameters  was reviewed with the receiving nurse.    Opportunity for questions and clarification was provided.     Is the patient on 02? NO       L/Min        Other     Is the patient on a monitor? NO    Is the nurse transporting with the patient? NO    Surgical Waiting Area notified of patient's transfer from PACU? YES      The following personal items collected during your admission accompanied patient upon transfer:   Dental Appliance: Dental Appliances: Uppers, Lowers  Vision:    Hearing Aid:    Jewelry:    Clothing:    Other Valuables:    Valuables sent to safe:

## 2019-11-28 NOTE — Other (Signed)
Family member [name] called to inform of surgery start time. No answer and no message left.    Sterile water 1030ml in splash basin for PRN use

## 2019-11-28 NOTE — Other (Signed)
[place]    CLINICAL NURSE SPECIALIST CONSULT     FOLLOW UP  NOTE  Initial Presentation   Lindsey Huynh is a 69 y.o. female admitted over weekend with left foot pain. Left great toe gangrenous.   Initial work up and evaluation reveals NO osteomyelitits.      HX:   Past Medical History:   Diagnosis Date   ??? Diabetes ([place])    ??? GERD (gastroesophageal reflux disease)    ??? Hypercholesterolemia    ??? Hypertension         DX: CT-no osteo/ Xray-unremarkable    TX: Vascular surgery consulted- left leg medially with healing ulcerations consistent with venous ulcer. Plan for flow studies today.     Hospital course   Clinical progress has been complicated by venous insufficiency - BG trends mostly within goal during admission.  Diabetes    Patient has known Type 2 diabetes, treated with Levemir and Metformin PTA.     Admission BG 170 and A1c 8.15 indicate more acceptable diabetes control. Previous A1C 03/2019 11.0% . Highest A1C Feb. 2020-12.1%.   Ambulatory blood glucose management provided by primary care provider-Last seen 11/05/2019 virtual visit  Consulted by Provider for advanced diabetes nursing assessment and care, specifically related to   []  Transitioning off Glucostabilizer   []  Inpatient management strategy  [x]  Home management assessment  []  Survival skill education    Diabetes-related medical history  Acute complications  NONE  Neurological complications  Peripheral neuropathy  Microvascular disease  NONE  Macrovascular disease  Peripheral vascular disease and Foot wounds  Other associated conditions     HTN/hypercholesteremia/    Diabetes medication history  Drug class Currently in use Discontinued Never used   Biguanide Metformin 1000mg  daily in divided doses     DDP-4 inhibitor       Sulfonylurea      Thiazolidinedione      GLP-1 RA      SGLT-2 inhibitors      Basal insulin Levemir 20units daily     Bolus insulin      Fixed Dose  Combinations        Subjective   Slated for  vascular surgery-bypass Thursday per notes.    Fasting BG 116mg /dl.  NPO for OR today    Patient reports the following home diabetes self-care practices:  Eating pattern-cut out "sugar", made some diet changes, with carb intake, especially breads.     Physical activity pattern-desires to be physically active, but recent pain in left leg has her unable to do any physical activity    Monitoring pattern-checks every other day before and after meals.      Taking medications pattern  [x]  Consistent administration  [x]  Affordable    Social determinants of health impacting diabetes self-management practices   Concerned that you need to know more about how to stay healthy with diabetes     Objective   Physical exam  General Alert, oriented and in no acute distress. Conversant and cooperative.   Vital Signs   Visit Vitals  BP 122/77 (BP 1 Location: Right upper arm, BP Patient Position: At rest)   Pulse 83   Temp 98.1 ??F (36.7 ??C)   Resp 16   Ht 5\' 5"  (1.651 m)   Wt 81.6 kg (180 lb)   SpO2 100%   BMI 29.95 kg/m??     Skin  Warm and dry.    Heart   Regular rate and rhythm. No  murmurs, rubs or gallops  Lungs  Clear to auscultation without rales or rhonchi  Extremities     Diabetic foot exam:    Left Foot-=+2 edema noted left lower leg      Visual Exam: ulcer- left great toe under nail and 2nd toe-black eschar noted   Pulse DP: 1+ (weak)   Filament test: reduced sensation      Right Foot   Visual Exam: normal    Pulse DP: 2+ (normal)   Filament test: reduced sensation     DP & PT pulses +2.     Laboratory  BMP:   Lab Results   Component Value Date/Time    NA 139 11/28/2019 02:17 AM    K 3.5 11/28/2019 02:17 AM    CL 105 11/28/2019 02:17 AM    CO2 25 11/28/2019 02:17 AM    AGAP 9 11/28/2019 02:17 AM    GLU 124 (H) 11/28/2019 02:17 AM    BUN 15 11/28/2019 02:17 AM    CREA 0.97 11/28/2019 02:17 AM    GFRAA >60 11/28/2019 02:17 AM    GFRNA 57 (L) 11/28/2019 02:17 AM      Factors impacting BG management  Factor Dose Comments    Nutrition:  Carb-controlled meals     60 grams/meal      Pain Left foot    Infection     Other: venous insuffiencey  Vascular surgery following Flow studies pending     Blood glucose pattern        Assessment and Plan   Nursing Diagnosis Risk for unstable blood glucose pattern   Nursing Intervention Domain 5250 Decision-making Support   Nursing Interventions Examined current inpatient diabetes control   Explored factors facilitating and impeding inpatient management  Identified self-management practices impeding diabetes control  Explored corrective strategies with patient and responsible inpatient provider   Informed patient of rational for insulin strategy while hospitalized    Instructed patient in importance of checking blood glucose DAILY before meals - discussed when she should check her BG after meals-2hours     Evaluation   [name], with Type 2 diabetes, did not achieve diabetes control prior to admission, as evidenced by admission BG of 170 and A1c of 81%.  She is followed closely by her PCP for continued diabetes management.  She last saw PCP via virtual visit in Feb. 2021. Her A1C is improved since July 2020 when it was >11%. She is concerned about the fate of her left foot, but is encouraged that her A1C has lowered significantly since July 2020.      No hypoglycemia noted for past two days.  Basal insulin appropriately reduced on 11/27/19.  Required minimal correctional insulin.    Recommendations     [x]  Use of Subcutaneous Insulin Order set [id number])  Insulin Dosing Specific recommendation   CONTINUE  Basal                                      (Based on weight, BMI & GFR) 16 units daily (20% reduction)    CONTINUE Corrective                                       (Useful in adjusting insulin dosing) [x]  Normal sensitivity  Billing Code(s)   [x]  99231 IP subsequent hospital care - 15 minutes    Before making these care recommendations, I personally reviewed the hospitalization record,  including notes, laboratory & diagnostic data and current medications, and   examined the patient at the bedside (circumstances permitting) before making care recommendations.     Total minutes: 15    Lily Lovings, CNS  Diabetes Clinical Nurse Specialist  [facility]  Access via [place]

## 2019-11-28 NOTE — Progress Notes (Signed)
TRANSFER - IN REPORT:    Verbal report received from Hanna RN(name) on Lindsey Huynh  being received from PACU(unit) for routine post - op      Report consisted of patient???s Situation, Background, Assessment and   Recommendations(SBAR).     Information from the following report(s) SBAR, Kardex and OR Summary was reviewed with the receiving nurse.    Opportunity for questions and clarification was provided.      Assessment completed upon patient???s arrival to unit and care assumed.

## 2019-11-28 NOTE — Op Note (Signed)
[place]  OPERATIVE REPORT    Name:  Lindsey Huynh, Lindsey Huynh  MR#:  [id number]  DOB:  22-Jun-1951  ACCOUNT #:  [id number]  DATE OF SERVICE:  11/28/2019      PREOPERATIVE DIAGNOSIS:  Peripheral vascular disease with ulceration, left great toe.    POSTOPERATIVE DIAGNOSIS:  Peripheral vascular disease with ulceration, left great toe.    PROCEDURE PERFORMED:  Left mid superficial femoral artery to proximal peroneal artery bypass using nonreversed saphenous vein.    SURGEON:  Belenda Cruise, MD    ASSISTANT:  Jenny Reichmann.    ANESTHESIA:  General.    COMPLICATIONS:  None.    SPECIMENS REMOVED:  None.    IMPLANTS:  None.    ESTIMATED BLOOD LOSS:  100 mL.    INDICATIONS:  The patient is a 69 year old female with diabetes and significant peripheral vascular disease who was admitted with left foot pain and nonhealing ulceration on the left great toe.  Angiography shows an occluded popliteal artery with reconstitution of the tibioperoneal trunk.  Her ankle-brachial index is 0.3.  She will undergo bypass for limb salvage.    PROCEDURE:  The patient's left leg was prepped and draped.  A transverse incision was made in the left inguinal crease.  The saphenous vein was identified, dissected free, and ligated and divided at the saphenofemoral junction.  The vein was then dissected distally.  Two interrupted longitudinal incisions were made, one in the mid thigh and one in the distal medial thigh.  The vein was further dissected free down to the knee.  The superficial femoral artery was dissected free through the mid thigh incision.  A longitudinal incision was made in the proximal medial lower leg.  The incision was deepened into the popliteal space.  The soleus muscle was divided identifying the tibioperoneal trunk and proximal posterior tibial and peroneal arteries.  The patient was heparinized.  The vein graft was sewn end-to-side to the superficial femoral artery using running 5-0 Prolene.  The vein was oriented in  a nonreversed fashion.  The valves within the vein graft were then cut using a valvulotome establishing good flow through the bypass.  The vein graft was then tunneled subfascially from the mid thigh to the distal thigh and then through the popliteal space.  It was then sewn end-to-side to the distal portion of the tibioperoneal trunk onto the peroneal artery.  This was done using running 6-0 Prolene.  Following this, there was a good pulse in the bypass and good flow by Doppler.  The incisions were closed with Vicryl subcutaneous suture and skin staples.  Dressings were applied and the patient was returned to the recovery room in stable condition.        Belenda Cruise, MD      [id number]  D:  11/28/2019 14:34  T:  11/28/2019 18:35  JOB #:  [id number]

## 2019-11-28 NOTE — Anesthesia Post-Procedure Evaluation (Signed)
Post-Anesthesia Evaluation and Assessment    Patient: Lindsey Huynh MRN: [id number]  SSN: [id number]    Date of Birth: May 31, 1951  Age: 69 y.o.  Sex: female      I have evaluated the patient and they are stable and ready for discharge from the PACU.     Cardiovascular Function/Vital Signs  Visit Vitals  BP 136/84   Pulse 87   Temp 36.8 ??C (98.2 ??F)   Resp 16   Ht 5\' 5"  (1.651 m)   Wt 81.6 kg (180 lb)   SpO2 100%   BMI 29.95 kg/m??       Patient is status post General anesthesia for Procedure(s):  LEFT FEMORAL-TIBIAL BYPASS WITH VEIN.    Nausea/Vomiting: None    Postoperative hydration reviewed and adequate.    Pain:  Pain Scale 1: Numeric (0 - 10) (11/28/19 0934)  Pain Intensity 1: 5 (11/28/19 0934)   Managed    Neurological Status:   Neuro (WDL): Within Defined Limits (11/25/19 1547)  Neuro  Neurologic State: Alert (11/27/19 2059)  Orientation Level: Oriented X4 (11/27/19 2059)  Cognition: Follows commands;Appropriate for age attention/concentration;Appropriate safety awareness;Appropriate decision making (11/27/19 2059)  Speech: Clear (11/27/19 2059)  LUE Motor Response: Purposeful (11/27/19 2059)  LLE Motor Response: Purposeful (11/27/19 2059)  RUE Motor Response: Purposeful (11/27/19 2059)  RLE Motor Response: Purposeful (11/27/19 2059)   At baseline    Mental Status, Level of Consciousness: Alert and  oriented to person, place, and time    Pulmonary Status:   O2 Device: CO2 nasal cannula (11/28/19 1444)   Adequate oxygenation and airway patent    Complications related to anesthesia: None    Post-anesthesia assessment completed. No concerns    Signed By: Eber Jones, MD     November 28, 2019              Procedure(s):  LEFT FEMORAL-TIBIAL BYPASS WITH VEIN.    general    <BSHSIANPOST>    INITIAL Post-op Vital signs:   Vitals Value Taken Time   BP 132/79 11/28/19 1450   Temp 36.8 ??C (98.2 ??F) 11/28/19 1444   Pulse 83 11/28/19 1453   Resp 15 11/28/19 1453   SpO2 97 % 11/28/19 1453   Vitals shown include  unvalidated device data.

## 2019-11-28 NOTE — Other (Signed)
TRANSFER - IN REPORT:    Verbal report received from [facility]) on Lindsey Huynh  being received from 5S(unit) for ordered procedure      Report consisted of patient???s Situation, Background, Assessment and   Recommendations(SBAR).     Information from the following report(s) SBAR was reviewed with the receiving nurse.    Opportunity for questions and clarification was provided.      Assessment completed upon patient???s arrival to unit and care assumed.

## 2019-11-28 NOTE — Other (Signed)
Patient: Lindsey Huynh MRN: [id number]  SSN: [id number]   Date of Birth: April 07, 1951  Age: 69 y.o.  Sex: female     Patient is status post Procedure(s):  LEFT FEMORAL-TIBIAL BYPASS WITH VEIN.    Surgeon(s) and Role:     Belenda Cruise, MD - Primary    Local/Dose/Irrigation: 2000 units heparin in 586mL NS used for irrigation                   Peripheral IV 11/23/19 Right Antecubital (Active)   Site Assessment Clean, dry, & intact 11/27/19 2059   Phlebitis Assessment 0 11/27/19 2059   Infiltration Assessment 0 11/27/19 2059   Dressing Status Clean, dry, & intact 11/27/19 2059   Dressing Type Transparent;Tape 11/27/19 2059   Hub Color/Line Status Flushed;Capped 11/27/19 2059   Action Taken Open ports on tubing capped 11/27/19 2059   Alcohol Cap Used Yes 11/27/19 2059       Peripheral IV Right Wrist (Active)       Peripheral IV 11/28/19 Left Hand (Active)            Airway - Endotracheal Tube 11/28/19 Oral (Active)                   Dressing/Packing:  Wound Toe (Comment  which one) Left-Dressing/Treatment: Open to air (11/27/19 0901)  Incision 11/25/19 Groin Right-Dressing/Treatment: (4x4's and medipore tape, ANGIO SEAL) (11/25/19 1500)  Incision 11/28/19 Leg Left-Dressing/Treatment: Tape/Soft cloth adhesive tape;Gauze dressing/dressing sponge (11/28/19 1300)    Splint/Cast:  ]    Other: wound on left great toe; dressed post-op with xeroform, gauze and Curlex

## 2019-11-28 NOTE — Interval H&P Note (Signed)
 TRANSFER - IN REPORT:    Verbal report received from [facility]) on Duane Trias  being received from 5S(unit) for ordered procedure      Report consisted of patient's Situation, Background, Assessment and   Recommendations(SBAR).     Information from the following report(s) SBAR was reviewed with the receiving nurse.    Opportunity for questions and clarification was provided.      Assessment completed upon patient's arrival to unit and care assumed.

## 2019-11-28 NOTE — Interval H&P Note (Signed)
 TRANSFER - OUT REPORT:    Verbal report given to Dan,RN(name) on Lindsey Huynh  being transferred to 558(unit) for routine post - op       Report consisted of patient's Situation, Background, Assessment and   Recommendations(SBAR).     Time Pre op antibiotic given:scheduled  Anesthesia Stop time: 14:47  Foley Present on Transfer to floor:yesyes  Order for Foley on Chart:yes  Discharge Prescriptions with Chart:no    Information from the following report(s) SBAR, Kardex, OR Summary, Procedure Summary, Intake/Output, MAR, Recent Results, Med Rec Status, Cardiac Rhythm SR and Alarm Parameters  was reviewed with the receiving nurse.    Opportunity for questions and clarification was provided.     Is the patient on 02? NO       L/Min        Other     Is the patient on a monitor? NO    Is the nurse transporting with the patient? NO    Surgical Waiting Area notified of patient's transfer from PACU? YES      The following personal items collected during your admission accompanied patient upon transfer:   Dental Appliance: Dental Appliances: Uppers, Lowers  Vision:    Hearing Aid:    Jewelry:    Clothing:    Other Valuables:    Valuables sent to safe:

## 2019-11-28 NOTE — Anesthesia Post-Procedure Evaluation (Signed)
Post-Anesthesia Evaluation and Assessment    Patient: Lindsey Huynh MRN: [id number]  SSN: [id number]    Date of Birth: 07/16/1951  Age: 69 y.o.  Sex: female      I have evaluated the patient and they are stable and ready for discharge from the PACU.     Cardiovascular Function/Vital Signs  Visit Vitals  BP 136/84   Pulse 87   Temp 36.8 ??C (98.2 ??F)   Resp 16   Ht 5\' 5"  (1.651 m)   Wt 81.6 kg (180 lb)   SpO2 100%   BMI 29.95 kg/m??       Patient is status post General anesthesia for Procedure(s):  LEFT FEMORAL-TIBIAL BYPASS WITH VEIN.    Nausea/Vomiting: None    Postoperative hydration reviewed and adequate.    Pain:  Pain Scale 1: Numeric (0 - 10) (11/28/19 0934)  Pain Intensity 1: 5 (11/28/19 0934)   Managed    Neurological Status:   Neuro (WDL): Within Defined Limits (11/25/19 1547)  Neuro  Neurologic State: Alert (11/27/19 2059)  Orientation Level: Oriented X4 (11/27/19 2059)  Cognition: Follows commands;Appropriate for age attention/concentration;Appropriate safety awareness;Appropriate decision making (11/27/19 2059)  Speech: Clear (11/27/19 2059)  LUE Motor Response: Purposeful (11/27/19 2059)  LLE Motor Response: Purposeful (11/27/19 2059)  RUE Motor Response: Purposeful (11/27/19 2059)  RLE Motor Response: Purposeful (11/27/19 2059)   At baseline    Mental Status, Level of Consciousness: Alert and  oriented to person, place, and time    Pulmonary Status:   O2 Device: CO2 nasal cannula (11/28/19 1444)   Adequate oxygenation and airway patent    Complications related to anesthesia: None    Post-anesthesia assessment completed. No concerns    Signed By: Eber Jones, MD     November 28, 2019              Procedure(s):  LEFT FEMORAL-TIBIAL BYPASS WITH VEIN.    general    <BSHSIANPOST>    INITIAL Post-op Vital signs:   Vitals Value Taken Time   BP 132/79 11/28/19 1450   Temp 36.8 ??C (98.2 ??F) 11/28/19 1444   Pulse 83 11/28/19 1453   Resp 15 11/28/19 1453   SpO2 97 % 11/28/19 1453   Vitals shown include  unvalidated device data.

## 2019-11-28 NOTE — Interval H&P Note (Signed)
Family member [name] called to inform of surgery start time. No answer and no message left.    Sterile water 1025ml in splash basin for PRN use

## 2019-11-28 NOTE — Group Note (Signed)
 Diabetes Mgmt by Moises Lindsey LABOR, CNS at 11/28/19 9074                Author: Moises Lindsey LABOR, CNS  Service: Certified Clinical Nurse Specialist  Author Type: Clinical Nurse Specialist       Filed: 11/28/19 0928  Date of Service: 11/28/19 0925  Status: Signed          Editor: Moises Lindsey LABOR, CNS (Clinical Nurse Specialist)               [place]      CLINICAL NURSE SPECIALIST CONSULT       FOLLOW UP  NOTE     Initial Presentation     Lindsey Huynh is a 69 y.o.  female admitted over weekend with left foot pain . Left great toe gangrenous.   Initial work up and evaluation reveals NO osteomyelitits.        HX:      Past Medical History:        Diagnosis  Date         ?  Diabetes (HCC)       ?  GERD (gastroesophageal reflux disease)       ?  Hypercholesterolemia           ?  Hypertension              DX: CT-no osteo/ Xray-unremarkable      TX: Vascular surgery consulted- left leg medially with healing ulcerations consistent with venous ulcer. Plan for flow studies today.         Hospital course     Clinical progress has been complicated by venous insufficiency - BG trends mostly within goal during admission.     Diabetes      Patient has known Type 2 diabetes, treated with Levemir and Metformin   PTA.       Admission BG 170 and A1c 8.15 indicate more acceptable diabetes control. Previous A1C 03/2019 11.0% . Highest A1C Feb. 2020-12.1%.    Ambulatory blood glucose management provided by primary care provider-Last seen 11/05/2019 virtual visit   Consulted by Provider for advanced diabetes nursing assessment and care, specifically related  to    []  Transitioning off Glucostabilizer    []  Inpatient management strategy   [x]  Home management assessment   []  Survival skill education      Diabetes-related medical history   Acute complications   NONE   Neurological complications   Peripheral neuropathy   Microvascular disease   NONE   Macrovascular disease   Peripheral  vascular disease and Foot wounds   Other associated conditions      HTN/hypercholesteremia/      Diabetes medication history        Drug class  Currently in use  Discontinued  Never used          Biguanide  Metformin  1000mg  daily in divided doses              DDP-4 inhibitor                 Sulfonylurea                Thiazolidinedione                GLP-1 RA                SGLT-2 inhibitors  Basal insulin   Levemir 20units daily              Bolus insulin                 Fixed Dose  Combinations                Subjective     Slated for vascular surgery-bypass Thursday per notes.      Fasting BG 116mg /dl.  NPO for OR today      Patient reports the following home diabetes self-care practices:  Eating pattern-cut out sugar, made some diet changes, with carb intake, especially breads.       Physical activity pattern-desires to be physically active, but recent pain in left leg has her unable to do any physical activity      Monitoring pattern-checks every other day before and after meals.        Taking medications pattern   [x]  Consistent administration   [x]  Affordable      Social determinants of health impacting diabetes self-management practices    Concerned that you need to know more about how to stay healthy with diabetes         Objective     Physical exam   General Alert, oriented and in no acute distress. Conversant and cooperative.    Vital Signs    Visit Vitals      BP  122/77 (BP 1 Location: Right upper arm, BP Patient Position: At rest)     Pulse  83     Temp  98.1 F (36.7 C)     Resp  16     Ht  5' 5 (1.651 m)     Wt  81.6 kg (180 lb)     SpO2  100%        BMI  29.95 kg/m        Skin  Warm and dry.     Heart   Regular rate and rhythm. No murmurs, rubs or gallops   Lungs  Clear to auscultation without rales or rhonchi   Extremities      Diabetic foot exam:     Left Foot-=+2 edema noted left lower leg       Visual Exam: ulcer- left great toe under nail and 2nd toe-black eschar noted    Pulse  DP: 1+ (weak)    Filament test: reduced sensation        Right Foot    Visual Exam: normal     Pulse DP: 2+ (normal)    Filament test: reduced sensation      DP & PT pulses +2.       Laboratory   BMP:      Lab Results         Component  Value  Date/Time            NA  139  11/28/2019 02:17 AM       K  3.5  11/28/2019 02:17 AM       CL  105  11/28/2019 02:17 AM       CO2  25  11/28/2019 02:17 AM       AGAP  9  11/28/2019 02:17 AM       GLU  124 (H)  11/28/2019 02:17 AM       BUN  15  11/28/2019 02:17 AM       CREA  0.97  11/28/2019 02:17 AM       GFRAA  >60  11/28/2019 02:17 AM            GFRNA  57 (L)  11/28/2019 02:17 AM         Factors impacting BG management       Factor  Dose  Comments         Nutrition:   Carb-controlled meals        60 grams/meal          Pain  Left foot           Infection             Other: venous insuffiencey   Vascular surgery following  Flow studies pending        Blood glucose pattern                Assessment and Plan        Nursing Diagnosis  Risk for unstable blood glucose pattern     Nursing Intervention Domain  5250 Decision-making Support        Nursing Interventions  Examined current inpatient diabetes control    Explored factors facilitating and impeding inpatient management   Identified self-management practices impeding diabetes control   Explored corrective strategies with patient and responsible inpatient provider    Informed patient of rational for insulin  strategy while hospitalized      Instructed patient in importance of checking blood glucose DAILY before meals - discussed when she should check her BG after meals-2hours          Evaluation     [name], with Type 2 diabetes, did not achieve diabetes control prior to admission, as evidenced by admission BG of 170 and A1c of 81%.  She is followed closely by her PCP  for continued diabetes management.  She last saw PCP via virtual visit in Feb. 2021. Her A1C is improved since July 2020 when it was >11%. She is  concerned about the fate of her left foot, but is encouraged that her A1C has lowered significantly since  July 2020.        No hypoglycemia noted for past two days.  Basal insulin  appropriately reduced on 11/27/19.  Required minimal correctional insulin .       Recommendations        [x]  Use of  Subcutaneous Insulin  Order set [phone])       Insulin   Dosing  Specific recommendation         CONTINUE  Basal                                      (Based on weight, BMI & GFR)  16 units daily (20% reduction)           CONTINUE Corrective                                       (Useful in adjusting insulin  dosing)  [x]   Normal sensitivity                     Billing Code(s)     [x]  99231 IP subsequent hospital  care - 15 minutes      Before making these care recommendations, I personally reviewed the hospitalization record, including notes, laboratory & diagnostic data and current medications, and    examined the patient  at the bedside (circumstances permitting) before making care recommendations.      Total minutes: 15      Lindsey Huynh, CNS   Diabetes Clinical Nurse Specialist   [facility]   Access via [employer]

## 2019-11-28 NOTE — Progress Notes (Signed)
 TRANSFER - OUT REPORT:    Verbal report given to Melissa RN(name) on Lindsey Huynh  being transferred to OR holding(unit) for ordered procedure       Report consisted of patient's Situation, Background, Assessment and   Recommendations(SBAR).     Information from the following report(s) SBAR and Kardex was reviewed with the receiving nurse.    Lines:   Peripheral IV 11/23/19 Right Antecubital (Active)   Site Assessment Clean, dry, & intact 11/27/19 2059   Phlebitis Assessment 0 11/27/19 2059   Infiltration Assessment 0 11/27/19 2059   Dressing Status Clean, dry, & intact 11/27/19 2059   Dressing Type Transparent;Tape 11/27/19 2059   Hub Color/Line Status Flushed;Capped 11/27/19 2059   Action Taken Open ports on tubing capped 11/27/19 2059   Alcohol Cap Used Yes 11/27/19 2059        Opportunity for questions and clarification was provided.      Patient transported with:       2

## 2019-11-28 NOTE — Progress Notes (Signed)
 Pharmacist Note - Vancomycin Dose adjustment  Therapy day 6  Indication: ssti, lt great toe pain/swelling dry gangrene,  lt 2nd toe cellulitis  Fem/tib bypass 3/11  Current regimen: 1000mg  q12h    Recent Labs     11/28/19  0217 11/27/19  0500 11/26/19  0449   WBC 8.4 8.0 7.5   CREA 0.97 0.74 0.79   BUN 15 13 12        A Trough Level resulted at 18.4 mcg/mL which was obtained 11 hrs post-dose.  The extrapolated true trough is approximately 17.26 mcg/mL based on the patient's known kinetics.   SCr up from yesterday  Doses documented as given according to schedule.     Goal trough: 10 - 15 mcg/mL     Plan: Change to 1000mg  q16h . Pharmacy will continue to monitor this patient daily for changes in clinical status and renal function.

## 2019-11-28 NOTE — Progress Notes (Signed)
Progress Notes by Hollice Espy, NP at 11/28/19 0820                Author: Hollice Espy, NP  Service: Nurse Practitioner  Author Type: Acute Care Nurse Practitioner       Filed: 11/28/19 0826  Date of Service: 11/28/19 0820  Status: Signed           Editor: Hollice Espy, NP (Acute Care Nurse Practitioner)  Cosigner: Venida Jarvis, MD at 11/29/19 [id number]                    [facility]                                                                                              Hospitalist Progress Note   Hollice Espy, NP   Answering service: [phone] OR [phone] from in house phone            Date of Service:  11/28/2019   NAME:  Lindsey Huynh   DOB:  02-10-1951   MRN:  [id number]           Admission Summary:        From H&P 11/23/2019:   "Lindsey Huynh??is a 69 y.o.??female??with past medical history of diabetes, insulin-dependent, GERD and hypertension comes for return of left foot pain and swelling.   Patient is awake, alert and oriented able to answer my question follow my request, data also obtained in the ED staff and extensive chart review. ??As per collective reports, patient??left  great toe pain which associated darkening of the skin on the distal end of the toe and swelling of the left foot the medial aspect of the Of the last 1 week and has been progressively getting worse. ??The only thing that he remembers  was fungal nail cream. ??She reported since her left second toe is in contact with the first toe and that pain he also became slightly cellulitic, she also has some dorsal swelling of the foot as well. ??This all progress from being mildly achy to  significant tender and swollen last 1 week and she came today because the pain was unbearable, 10/10, getting from her toe to her foot and made her ambulation significantly difficult. ??Although she continues to have pain at rest. ??She has not seek  medical help for that until today, did not remember any  trauma or injury to her feet. ??She is hemodynamically otherwise stable in the ED on her own. ??Patient was hemodynamically stable in the ED, and work revealed hypokalemia leukocytosis and x-ray  the foot was without any acute abnormality. ??Given concerns for dry gangrene and cellulitis, she was started on IV vancomycin. Patient has no other medical complaints. ??Patient will be admitted to hospitalist??service for evaluation management."      Interval history / Subjective:             F/U for left foot cellulitis, left great toe gangrene, DMII, HTN, GERD, Hypokalemia, left popliteal occlusion.     Patient seen and examined. States that she had  a large bowel movement last night, therefore feels much better.    Having left leg pain but feels it is being controlled with pain medication.    No new complaints. No overnight events noted.      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   DMII: hold metformin while IP   -Cont basal insulin, SSI   -Diabetic diet   -a1c 8.1   - DM following    ??   HTN   -Controlled   -Cont home meds    ??   GERD   -Cont PPI   ??   Hypokalemia   -Resolved      Code status: Full    DVT prophylaxis: Lovenox       Care Plan discussed with: Patient/Family and Nurse   Anticipated Disposition: Home w/Family   Anticipated Discharge: 24 hours to 48 hours           Hospital Problems   Date Reviewed:  05/31/2018                         Codes  Class  Noted  POA              Gangrene ([place])  ICD-10-CM: [id number]   ICD-9-CM: 785.4    11/23/2019  Unknown                               Review of Systems:     A comprehensive review of systems was negative except for that written in the HPI.            Vital Signs:      Last 24hrs VS reviewed since prior progress note. Most recent are:   Visit Vitals      BP  122/77 (BP 1 Location:  Right upper arm, BP Patient Position: At rest)     Pulse  83     Temp  98.1 ??F (36.7 ??C)     Resp  16     Ht  5\' 5"  (1.651 m)     Wt  81.6 kg (180 lb)     SpO2  100%        BMI  29.95 kg/m??              Intake/Output Summary (Last 24 hours) at 11/28/2019 [id number]   Last data filed at 11/27/2019 0901     Gross per 24 hour        Intake  320 ml        Output  --        Net  320 ml              Physical Examination:                     Constitutional:   No acute distress, cooperative, pleasant, answers questions     ENT:   Oral mucosa moist, oropharynx benign.      Resp:   CTA bilaterally on room air. No wheezing/rhonchi/rales. No accessory muscle use     CV:   Regular rhythm, normal  rate, no murmurs, gallops, rubs      GI:   Soft, non distended, non tender. normoactive bowel sounds, no hepatosplenomegaly       Musculoskeletal:   LLE +1 edema.          Neurologic:   Moves all extremities.  AAOx3, CN II-XII reviewed       Psych:  Good insight, Not anxious nor agitated.                            Skin: LLE medial large scabbed ulcer; right great toe erythematous, swollen, small area dry gangrene noted               Data Review:      Review and/or order of clinical lab test   Review and/or order of tests in the medicine section of CPT           Labs:          Recent Labs            11/28/19   0217  11/27/19   0500     WBC  8.4  8.0     HGB  11.4*  11.2*     HCT  33.8*  32.6*         PLT  393  407*          Recent Labs             11/28/19   0217  11/27/19   0500  11/26/19   0449     NA  139  137  136     K  3.5  3.2*  3.2*     CL  105  103  103     CO2  25  28  27      BUN  15  13  12      CREA  0.97  0.74  0.79     GLU  124*  66  76          CA  10.4*  9.7  10.1        No results for input(s): ALT, AP, TBIL, TBILI, TP, ALB, GLOB, GGT, AML, LPSE in the last 72 hours.      No lab exists for component: SGOT, GPT, AMYP, HLPSE   No results for input(s): INR, PTP, APTT, INREXT in the last 72 hours.    No results for input(s): FE,  TIBC, PSAT, FERR in the last 72 hours.    No results found for: FOL, RBCF    No results for input(s): PH, PCO2, PO2 in the last 72 hours.   No results for input(s): CPK, CKNDX, TROIQ in the last 72 hours.      No lab exists for component: CPKMB     Lab Results         Component  Value  Date/Time            Cholesterol, total  219 (H)  10/30/2018 10:27 AM       HDL Cholesterol  63  10/30/2018 10:27 AM       LDL, calculated  130 (H)  10/30/2018 10:27 AM       Triglyceride  131  10/30/2018 10:27 AM            CHOL/HDL Ratio  3.4  12/26/2007 09:12 AM          Lab Results  Component  Value  Date/Time            Glucose (POC)  116 (H)  11/28/2019 06:42 AM       Glucose (POC)  184 (H)  11/27/2019 09:32 PM       Glucose (POC)  124 (H)  11/27/2019 04:45 PM       Glucose (POC)  153 (H)  11/27/2019 11:08 AM            Glucose (POC)  71  11/27/2019 07:03 AM        No results found for: COLOR, APPRN, SPGRU, REFSG, PHU, PROTU, GLUCU, KETU, BILU, UROU, NITU, LEUKU, GLUKE, EPSU, BACTU, WBCU, RBCU, CASTS, UCRY           Medications Reviewed:          Current Facility-Administered Medications          Medication  Dose  Route  Frequency           ?  bisacodyL (DULCOLAX) suppository 10 mg   10 mg  Rectal  DAILY PRN     ?  sodium phosphate (FLEET'S) enema 1 Enema   1 Enema  Rectal  PRN     ?  oxyCODONE IR (ROXICODONE) tablet 5 mg   5 mg  Oral  Q4H PRN     ?  oxyCODONE IR (ROXICODONE) tablet 10 mg   10 mg  Oral  Q4H PRN     ?  traMADoL (ULTRAM) tablet 50 mg   50 mg  Oral  Q4H PRN     ?  insulin glargine (LANTUS) injection 16 Units   16 Units  SubCUTAneous  QHS     ?  Vancomycin- Pharmacy to Dose     Other  Rx Dosing/Monitoring     ?  vancomycin (VANCOCIN) 1,000 mg in 0.9% sodium chloride 250 mL (VIAL-MATE)   1,000 mg  IntraVENous  Q12H     ?  gabapentin (NEURONTIN) capsule 300 mg   300 mg  Oral  QHS     ?  losartan (COZAAR) tablet 100 mg   100 mg  Oral  DAILY     ?  morphine injection 1 mg   1 mg  IntraVENous  Q4H PRN     ?   docusate sodium (COLACE) capsule 100 mg   100 mg  Oral  BID     ?  senna (SENOKOT) tablet 8.6 mg   1 Tab  Oral  DAILY     ?  polyethylene glycol (MIRALAX) packet 17 g   17 g  Oral  DAILY PRN     ?  bisacodyL (DULCOLAX) suppository 10 mg   10 mg  Rectal  DAILY PRN     ?  hydroCHLOROthiazide (HYDRODIURIL) tablet 25 mg   25 mg  Oral  DAILY     ?  sodium chloride (NS) flush 5-40 mL   5-40 mL  IntraVENous  Q8H     ?  sodium chloride (NS) flush 5-40 mL   5-40 mL  IntraVENous  PRN     ?  acetaminophen (TYLENOL) tablet 650 mg   650 mg  Oral  Q6H PRN          Or           ?  acetaminophen (TYLENOL) suppository 650 mg   650 mg  Rectal  Q6H PRN     ?  polyethylene glycol (MIRALAX) packet 17 g   17 g  Oral  DAILY PRN     ?  promethazine (PHENERGAN) tablet 12.5 mg   12.5 mg  Oral  Q6H PRN          Or           ?  ondansetron (ZOFRAN) injection 4 mg   4 mg  IntraVENous  Q6H PRN     ?  glucose chewable tablet 16 g   4 Tab  Oral  PRN     ?  dextrose (D50W) injection syrg 12.5-25 g   25-50 mL  IntraVENous  PRN     ?  glucagon (GLUCAGEN) injection 1 mg   1 mg  IntraMUSCular  PRN     ?  insulin lispro (HUMALOG) injection     SubCUTAneous  AC&HS     ?  aspirin delayed-release tablet 81 mg   81 mg  Oral  DAILY     ?  pantoprazole (PROTONIX) tablet 40 mg   40 mg  Oral  ACB     ?  amLODIPine (NORVASC) tablet 10 mg   10 mg  Oral  DAILY           ?  [Held by provider] enoxaparin (LOVENOX) injection 40 mg   40 mg  SubCUTAneous  Q24H        ______________________________________________________________________   EXPECTED LENGTH OF STAY: 3d 2h   ACTUAL LENGTH OF STAY:          Remy, NP

## 2019-11-28 NOTE — Progress Notes (Signed)
 TRANSFER - IN REPORT:    Verbal report received from Susan RN(name) on Lindsey Huynh  being received from PACU(unit) for routine post - op      Report consisted of patient's Situation, Background, Assessment and   Recommendations(SBAR).     Information from the following report(s) SBAR, Kardex and OR Summary was reviewed with the receiving nurse.    Opportunity for questions and clarification was provided.      Assessment completed upon patient's arrival to unit and care assumed.

## 2019-11-28 NOTE — Op Note (Signed)
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      Procedure(s):  LEFT SFA peroneal BYPASS WITH VEIN    Surgeon(s):  Belenda Cruise, MD    Surgical Assistant: Surg Asst-1: Luis Abed    Anesthesia: General     Estimated Blood Loss (mL): less than [phone]     Complications: None    Specimens: * No specimens in log *     Implants: * No implants in log *    Drains: * No LDAs found *    Findings: GOOD VEIN    Electronically Signed by Belenda Cruise, MD on 11/28/2019 at 2:24 PM

## 2019-11-28 NOTE — Progress Notes (Signed)
[facility]                                                                                          Hospitalist Progress Note  Hollice Espy, NP  Answering service: [phone] OR [phone] from in house phone        Date of Service:  11/28/2019  NAME:  Margurete Hoepner  DOB:  1950-12-17  MRN:  [id number]      Admission Summary:   From H&P 11/23/2019:  "Avriella Kerstein??is a 69 y.o.??female??with past medical history of diabetes, insulin-dependent, GERD and hypertension comes for return of left foot pain and swelling.  Patient is awake, alert and oriented able to answer my question follow my request, data also obtained in the ED staff and extensive chart review. ??As per collective reports, patient??left great toe pain which associated darkening of the skin on the distal end of the toe and swelling of the left foot the medial aspect of the Of the last 1 week and has been progressively getting worse. ??The only thing that he remembers was fungal nail cream. ??She reported since her left second toe is in contact with the first toe and that pain he also became slightly cellulitic, she also has some dorsal swelling of the foot as well. ??This all progress from being mildly achy to significant tender and swollen last 1 week and she came today because the pain was unbearable, 10/10, getting from her toe to her foot and made her ambulation significantly difficult. ??Although she continues to have pain at rest. ??She has not seek medical help for that until today, did not remember any trauma or injury to her feet. ??She is hemodynamically otherwise stable in the ED on her own. ??Patient was hemodynamically stable in the ED, and work revealed hypokalemia leukocytosis and x-ray the foot was without any acute abnormality. ??Given concerns for dry gangrene and cellulitis, she was started on IV vancomycin. Patient has no other medical complaints. ??Patient will be admitted to hospitalist??service for  evaluation management."    Interval history / Subjective:      F/U for left foot cellulitis, left great toe gangrene, DMII, HTN, GERD, Hypokalemia, left popliteal occlusion.    Patient seen and examined. States that she had a large bowel movement last night, therefore feels much better.   Having left leg pain but feels it is being controlled with pain medication.   No new complaints. No overnight events noted.   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  DMII: hold metformin while IP  -Cont basal insulin, SSI  -Diabetic diet  -a1c 8.1  - DM following   ??  HTN  -Controlled  -Cont home meds   ??  GERD  -Cont PPI  ??  Hypokalemia  -Resolved    Code status: Full   DVT prophylaxis: Lovenox     Care Plan  discussed with: Patient/Family and Nurse  Anticipated Disposition: Home w/Family  Anticipated Discharge: 24 hours to 48 hours     Hospital Problems  Date Reviewed: 2018/06/22          Codes Class Noted POA    Gangrene ([place]) ICD-10-CM: [id number]  ICD-9-CM: 785.4  11/23/2019 Unknown                Review of Systems:   A comprehensive review of systems was negative except for that written in the HPI.       Vital Signs:    Last 24hrs VS reviewed since prior progress note. Most recent are:  Visit Vitals  BP 122/77 (BP 1 Location: Right upper arm, BP Patient Position: At rest)   Pulse 83   Temp 98.1 ??F (36.7 ??C)   Resp 16   Ht 5\' 5"  (1.651 m)   Wt 81.6 kg (180 lb)   SpO2 100%   BMI 29.95 kg/m??         Intake/Output Summary (Last 24 hours) at 11/28/2019 0821  Last data filed at 11/27/2019 0901  Gross per 24 hour   Intake 320 ml   Output ???   Net 320 ml        Physical Examination:             Constitutional:  No acute distress, cooperative, pleasant, answers questions   ENT:  Oral mucosa moist, oropharynx benign.    Resp:  CTA bilaterally on room air. No  wheezing/rhonchi/rales. No accessory muscle use   CV:  Regular rhythm, normal rate, no murmurs, gallops, rubs    GI:  Soft, non distended, non tender. normoactive bowel sounds, no hepatosplenomegaly     Musculoskeletal:  LLE +1 edema.     Neurologic:  Moves all extremities.  AAOx3, CN II-XII reviewed     Psych:  Good insight, Not anxious nor agitated.                           Skin: LLE medial large scabbed ulcer; right great toe erythematous, swollen, small area dry gangrene noted       Data Review:    Review and/or order of clinical lab test  Review and/or order of tests in the medicine section of CPT      Labs:     Recent Labs     11/28/19  0217 11/27/19  0500   WBC 8.4 8.0   HGB 11.4* 11.2*   HCT 33.8* 32.6*   PLT 393 407*     Recent Labs     11/28/19  0217 11/27/19  0500 11/26/19  0449   NA 139 137 136   K 3.5 3.2* 3.2*   CL 105 103 103   CO2 25 28 27    BUN 15 13 12    CREA 0.97 0.74 0.79   GLU 124* 66 76   CA 10.4* 9.7 10.1     No results for input(s): ALT, AP, TBIL, TBILI, TP, ALB, GLOB, GGT, AML, LPSE in the last 72 hours.    No lab exists for component: SGOT, GPT, AMYP, HLPSE  No results for input(s): INR, PTP, APTT, INREXT in the last 72 hours.   No results for input(s): FE, TIBC, PSAT, FERR in the last 72 hours.   No results found for: FOL, RBCF   No results for input(s): PH, PCO2, PO2 in the last 72 hours.  No results for input(s): CPK, CKNDX, TROIQ  in the last 72 hours.    No lab exists for component: CPKMB  Lab Results   Component Value Date/Time    Cholesterol, total 219 (H) 10/30/2018 10:27 AM    HDL Cholesterol 63 10/30/2018 10:27 AM    LDL, calculated 130 (H) 10/30/2018 10:27 AM    Triglyceride 131 10/30/2018 10:27 AM    CHOL/HDL Ratio 3.4 12/26/2007 09:12 AM     Lab Results   Component Value Date/Time    Glucose (POC) 116 (H) 11/28/2019 06:42 AM    Glucose (POC) 184 (H) 11/27/2019 09:32 PM    Glucose (POC) 124 (H) 11/27/2019 04:45 PM    Glucose (POC) 153 (H) 11/27/2019 11:08 AM    Glucose (POC) 71  11/27/2019 07:03 AM     No results found for: COLOR, APPRN, SPGRU, REFSG, PHU, PROTU, GLUCU, KETU, BILU, UROU, NITU, LEUKU, GLUKE, EPSU, BACTU, WBCU, RBCU, CASTS, UCRY      Medications Reviewed:     Current Facility-Administered Medications   Medication Dose Route Frequency   ??? bisacodyL (DULCOLAX) suppository 10 mg  10 mg Rectal DAILY PRN   ??? sodium phosphate (FLEET'S) enema 1 Enema  1 Enema Rectal PRN   ??? oxyCODONE IR (ROXICODONE) tablet 5 mg  5 mg Oral Q4H PRN   ??? oxyCODONE IR (ROXICODONE) tablet 10 mg  10 mg Oral Q4H PRN   ??? traMADoL (ULTRAM) tablet 50 mg  50 mg Oral Q4H PRN   ??? insulin glargine (LANTUS) injection 16 Units  16 Units SubCUTAneous QHS   ??? Vancomycin- Pharmacy to Dose   Other Rx Dosing/Monitoring   ??? vancomycin (VANCOCIN) 1,000 mg in 0.9% sodium chloride 250 mL (VIAL-MATE)  1,000 mg IntraVENous Q12H   ??? gabapentin (NEURONTIN) capsule 300 mg  300 mg Oral QHS   ??? losartan (COZAAR) tablet 100 mg  100 mg Oral DAILY   ??? morphine injection 1 mg  1 mg IntraVENous Q4H PRN   ??? docusate sodium (COLACE) capsule 100 mg  100 mg Oral BID   ??? senna (SENOKOT) tablet 8.6 mg  1 Tab Oral DAILY   ??? polyethylene glycol (MIRALAX) packet 17 g  17 g Oral DAILY PRN   ??? bisacodyL (DULCOLAX) suppository 10 mg  10 mg Rectal DAILY PRN   ??? hydroCHLOROthiazide (HYDRODIURIL) tablet 25 mg  25 mg Oral DAILY   ??? sodium chloride (NS) flush 5-40 mL  5-40 mL IntraVENous Q8H   ??? sodium chloride (NS) flush 5-40 mL  5-40 mL IntraVENous PRN   ??? acetaminophen (TYLENOL) tablet 650 mg  650 mg Oral Q6H PRN    Or   ??? acetaminophen (TYLENOL) suppository 650 mg  650 mg Rectal Q6H PRN   ??? polyethylene glycol (MIRALAX) packet 17 g  17 g Oral DAILY PRN   ??? promethazine (PHENERGAN) tablet 12.5 mg  12.5 mg Oral Q6H PRN    Or   ??? ondansetron (ZOFRAN) injection 4 mg  4 mg IntraVENous Q6H PRN   ??? glucose chewable tablet 16 g  4 Tab Oral PRN   ??? dextrose (D50W) injection syrg 12.5-25 g  25-50 mL IntraVENous PRN   ??? glucagon (GLUCAGEN) injection 1 mg  1 mg  IntraMUSCular PRN   ??? insulin lispro (HUMALOG) injection   SubCUTAneous AC&HS   ??? aspirin delayed-release tablet 81 mg  81 mg Oral DAILY   ??? pantoprazole (PROTONIX) tablet 40 mg  40 mg Oral ACB   ??? amLODIPine (NORVASC) tablet 10 mg  10 mg Oral DAILY   ??? [  Held by provider] enoxaparin (LOVENOX) injection 40 mg  40 mg SubCUTAneous Q24H     ______________________________________________________________________  EXPECTED LENGTH OF STAY: 3d 2h  ACTUAL LENGTH OF STAY:          [place], NP

## 2019-11-28 NOTE — Progress Notes (Signed)
Pharmacist Note - Vancomycin Dose adjustment  Therapy day 6  Indication: ssti, lt great toe pain/swelling dry gangrene,  lt 2nd toe cellulitis  Fem/tib bypass 3/11  Current regimen: 1000mg  q12h    Recent Labs     11/28/19  0217 11/27/19  0500 11/26/19  0449   WBC 8.4 8.0 7.5   CREA 0.97 0.74 0.79   BUN 15 13 12        A Trough Level resulted at 18.4 mcg/mL which was obtained 11 hrs post-dose.  The extrapolated "true" trough is approximately 17.26 mcg/mL based on the patient's known kinetics.   SCr up from yesterday  Doses documented as given according to schedule.     Goal trough: 10 - 15 mcg/mL     Plan: Change to 1000mg  q16h . Pharmacy will continue to monitor this patient daily for changes in clinical status and renal function.

## 2019-11-28 NOTE — Brief Op Note (Signed)
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      Procedure(s):  LEFT SFA peroneal BYPASS WITH VEIN    Surgeon(s):  Belenda Cruise, MD    Surgical Assistant: Surg Asst-1: Luis Abed    Anesthesia: General     Estimated Blood Loss (mL): less than [id number]     Complications: None    Specimens: * No specimens in log *     Implants: * No implants in log *    Drains: * No LDAs found *    Findings: GOOD VEIN    Electronically Signed by Belenda Cruise, MD on 11/28/2019 at 2:24 PM

## 2019-11-28 NOTE — Progress Notes (Signed)
TRANSFER - OUT REPORT:    Verbal report given to Melissa RN(name) on Lindsey Huynh  being transferred to OR holding(unit) for ordered procedure       Report consisted of patient???s Situation, Background, Assessment and   Recommendations(SBAR).     Information from the following report(s) SBAR and Kardex was reviewed with the receiving nurse.    Lines:   Peripheral IV 11/23/19 Right Antecubital (Active)   Site Assessment Clean, dry, & intact 11/27/19 2059   Phlebitis Assessment 0 11/27/19 2059   Infiltration Assessment 0 11/27/19 2059   Dressing Status Clean, dry, & intact 11/27/19 2059   Dressing Type Transparent;Tape 11/27/19 2059   Hub Color/Line Status Flushed;Capped 11/27/19 2059   Action Taken Open ports on tubing capped 11/27/19 2059   Alcohol Cap Used Yes 11/27/19 2059        Opportunity for questions and clarification was provided.      Patient transported with:       2

## 2019-11-29 ENCOUNTER — Inpatient Hospital Stay: Admit: 2019-11-30 | Payer: Medicare (Managed Care) | Primary: Family

## 2019-11-29 LAB — CBC WITH AUTOMATED DIFF
ABS. BASOPHILS: 0.1 10*3/uL (ref 0.0–0.1)
ABS. EOSINOPHILS: 0.2 10*3/uL (ref 0.0–0.4)
ABS. IMM. GRANS.: 0 10*3/uL (ref 0.00–0.04)
ABS. LYMPHOCYTES: 1.3 10*3/uL (ref 0.8–3.5)
ABS. MONOCYTES: 1.1 10*3/uL — ABNORMAL HIGH (ref 0.0–1.0)
ABS. NEUTROPHILS: 7.4 10*3/uL (ref 1.8–8.0)
ABSOLUTE NRBC: 0 10*3/uL (ref 0.00–0.01)
BASOPHILS: 1 % (ref 0–1)
EOSINOPHILS: 2 % (ref 0–7)
HCT: 29 % — ABNORMAL LOW (ref 35.0–47.0)
HGB: 9.5 g/dL — ABNORMAL LOW (ref 11.5–16.0)
IMMATURE GRANULOCYTES: 0 % (ref 0.0–0.5)
LYMPHOCYTES: 13 % (ref 12–49)
MCH: 31.1 PG (ref 26.0–34.0)
MCHC: 32.8 g/dL (ref 30.0–36.5)
MCV: 95.1 FL (ref 80.0–99.0)
MONOCYTES: 11 % (ref 5–13)
MPV: 10.5 FL (ref 8.9–12.9)
NEUTROPHILS: 73 % (ref 32–75)
NRBC: 0 PER 100 WBC
PLATELET: 336 10*3/uL (ref 150–400)
RBC: 3.05 M/uL — ABNORMAL LOW (ref 3.80–5.20)
RDW: 12.9 % (ref 11.5–14.5)
WBC: 10.1 10*3/uL (ref 3.6–11.0)

## 2019-11-29 LAB — GLUCOSE, POC
Glucose (POC): 152 mg/dL — ABNORMAL HIGH (ref 65–100)
Glucose (POC): 75 mg/dL (ref 65–100)
Glucose (POC): 134 mg/dL — ABNORMAL HIGH (ref 65–100)
Glucose (POC): 171 mg/dL — ABNORMAL HIGH (ref 65–100)

## 2019-11-29 LAB — EKG, 12 LEAD, INITIAL
Atrial Rate: 77 {beats}/min
Calculated P Axis: 35 degrees
Calculated R Axis: -2 degrees
Calculated T Axis: 129 degrees
P-R Interval: 132 ms
Q-T Interval: 374 ms
QRS Duration: 88 ms
QTC Calculation (Bezet): 423 ms
Ventricular Rate: 77 {beats}/min

## 2019-11-29 LAB — METABOLIC PANEL, BASIC
Anion gap: 7 mmol/L (ref 5–15)
BUN/Creatinine ratio: 13 (ref 12–20)
BUN: 10 MG/DL (ref 6–20)
CO2: 24 mmol/L (ref 21–32)
Calcium: 9.5 MG/DL (ref 8.5–10.1)
Chloride: 107 mmol/L (ref 97–108)
Creatinine: 0.77 MG/DL (ref 0.55–1.02)
GFR est AA: >60 mL/min/{1.73_m2} (ref >60)
GFR est non-AA: >60 mL/min/{1.73_m2} (ref >60)
Glucose: 89 mg/dL (ref 65–100)
Potassium: 3.8 mmol/L (ref 3.5–5.1)
Sodium: 138 mmol/L (ref 136–145)

## 2019-11-29 LAB — METABOLIC PANEL, COMPREHENSIVE
A-G Ratio: 1.1 (ref 1.1–2.2)
ALT (SGPT): 23 U/L (ref 12–78)
AST (SGOT): 12 U/L — ABNORMAL LOW (ref 15–37)
Albumin: 4 g/dL (ref 3.5–5.0)
Alk. phosphatase: 150 U/L — ABNORMAL HIGH (ref 45–117)
Anion gap: 4 mmol/L — ABNORMAL LOW (ref 5–15)
BUN/Creatinine ratio: 23 — ABNORMAL HIGH (ref 12–20)
BUN: 17 MG/DL (ref 6–20)
Bilirubin, total: 0.2 MG/DL (ref 0.2–1.0)
CO2: 30 mmol/L (ref 21–32)
Calcium: 10 MG/DL (ref 8.5–10.1)
Chloride: 104 mmol/L (ref 97–108)
Creatinine: 0.74 MG/DL (ref 0.55–1.02)
GFR est AA: >60 mL/min/{1.73_m2} (ref >60)
GFR est non-AA: >60 mL/min/{1.73_m2} (ref >60)
Globulin: 3.8 g/dL (ref 2.0–4.0)
Potassium: 3.2 mmol/L — ABNORMAL LOW (ref 3.5–5.1)
Protein, total: 7.8 g/dL (ref 6.4–8.2)
Sodium: 138 mmol/L (ref 136–145)

## 2019-11-29 LAB — CBC WITH AUTO DIFFERENTIAL
Basophils %: 1 % (ref 0–1)
Basophils Absolute: 0.1 10*3/uL (ref 0.0–0.1)
Eosinophils %: 2 % (ref 0–7)
Eosinophils Absolute: 0.2 10*3/uL (ref 0.0–0.4)
Granulocyte Absolute Count: 0 10*3/uL (ref 0.00–0.04)
Hematocrit: 29 % — ABNORMAL LOW (ref 35.0–47.0)
Hemoglobin: 9.5 g/dL — ABNORMAL LOW (ref 11.5–16.0)
Immature Granulocytes %: 0 % (ref 0.0–0.5)
Lymphocytes %: 13 % (ref 12–49)
Lymphocytes Absolute: 1.3 10*3/uL (ref 0.8–3.5)
MCH: 31.1 PG (ref 26.0–34.0)
MCHC: 32.8 g/dL (ref 30.0–36.5)
MCV: 95.1 FL (ref 80.0–99.0)
MPV: 10.5 FL (ref 8.9–12.9)
Monocytes %: 11 % (ref 5–13)
Monocytes Absolute: 1.1 10*3/uL — ABNORMAL HIGH (ref 0.0–1.0)
NRBC Absolute: 0 10*3/uL (ref 0.00–0.01)
Neutrophils %: 73 % (ref 32–75)
Neutrophils Absolute: 7.4 10*3/uL (ref 1.8–8.0)
Nucleated RBCs: 0 PER 100 WBC
Platelets: 336 10*3/uL (ref 150–400)
RBC: 3.05 M/uL — ABNORMAL LOW (ref 3.80–5.20)
RDW: 12.9 % (ref 11.5–14.5)
WBC: 10.1 10*3/uL (ref 3.6–11.0)

## 2019-11-29 LAB — BASIC METABOLIC PANEL
Anion Gap: 7 mmol/L (ref 5–15)
BUN/Creatinine Ratio: 13 (ref 12–20)
BUN: 10 MG/DL (ref 6–20)
CO2: 24 mmol/L (ref 21–32)
Calcium: 9.5 MG/DL (ref 8.5–10.1)
Chloride: 107 mmol/L (ref 97–108)
Creatinine: 0.77 MG/DL (ref 0.55–1.02)
GFR African American: >60 mL/min/{1.73_m2} (ref >60)
Glucose: 89 mg/dL (ref 65–100)
Potassium: 3.8 mmol/L (ref 3.5–5.1)
Sodium: 138 mmol/L (ref 136–145)
eGFR NON-AA: >60 mL/min/{1.73_m2} (ref >60)

## 2019-11-29 LAB — COMPREHENSIVE METABOLIC PANEL
ALT: 23 U/L (ref 12–78)
AST: 12 U/L — ABNORMAL LOW (ref 15–37)
Albumin/Globulin Ratio: 1.1 (ref 1.1–2.2)
Albumin: 4 g/dL (ref 3.5–5.0)
Alkaline Phosphatase: 150 U/L — ABNORMAL HIGH (ref 45–117)
Anion Gap: 4 mmol/L — ABNORMAL LOW (ref 5–15)
BUN/Creatinine Ratio: 23 — ABNORMAL HIGH (ref 12–20)
BUN: 17 MG/DL (ref 6–20)
CO2: 30 mmol/L (ref 21–32)
Calcium: 10 MG/DL (ref 8.5–10.1)
Chloride: 104 mmol/L (ref 97–108)
Creatinine: 0.74 MG/DL (ref 0.55–1.02)
GFR African American: >60 mL/min/{1.73_m2} (ref >60)
Globulin: 3.8 g/dL (ref 2.0–4.0)
Potassium: 3.2 mmol/L — ABNORMAL LOW (ref 3.5–5.1)
Sodium: 138 mmol/L (ref 136–145)
Total Bilirubin: 0.2 MG/DL (ref 0.2–1.0)
Total Protein: 7.8 g/dL (ref 6.4–8.2)
eGFR NON-AA: >60 mL/min/{1.73_m2} (ref >60)

## 2019-11-29 LAB — POCT GLUCOSE
POC Glucose: 152 mg/dL — ABNORMAL HIGH (ref 65–100)
POC Glucose: 75 mg/dL (ref 65–100)
POC Glucose: 134 mg/dL — ABNORMAL HIGH (ref 65–100)
POC Glucose: 171 mg/dL — ABNORMAL HIGH (ref 65–100)

## 2019-11-29 LAB — EKG 12-LEAD
Atrial Rate: 77 {beats}/min
P Axis: 35 degrees
P-R Interval: 132 ms
Q-T Interval: 374 ms
QRS Duration: 88 ms
QTc Calculation (Bazett): 423 ms
R Axis: -2 degrees
T Axis: 129 degrees
Ventricular Rate: 77 {beats}/min

## 2019-11-29 MED ORDER — MORPHINE 2 MG/ML INJECTION
2 mg/mL | INTRAMUSCULAR | Status: DC | PRN
Start: 2019-11-29 — End: 2019-12-01
  Administered 2019-11-29 – 2019-11-30 (×5): via INTRAVENOUS

## 2019-11-29 MED FILL — ENOXAPARIN 40 MG/0.4 ML SUB-Q SYRINGE: 40 mg/0.4 mL | SUBCUTANEOUS | Qty: 0.4

## 2019-11-29 MED FILL — MORPHINE 2 MG/ML INJECTION: 2 mg/mL | INTRAMUSCULAR | Qty: 1

## 2019-11-29 MED FILL — OXYCODONE 5 MG TAB: 5 mg | ORAL | Qty: 1

## 2019-11-29 MED FILL — SENNA LAX 8.6 MG TABLET: 8.6 mg | ORAL | Qty: 1

## 2019-11-29 MED FILL — MORPHINE 2 MG/ML INJECTION: 2 mg/mL | INTRAMUSCULAR | Qty: 2

## 2019-11-29 MED FILL — INSULIN GLARGINE 100 UNIT/ML INJECTION: 100 unit/mL | SUBCUTANEOUS | Qty: 1

## 2019-11-29 MED FILL — HYDROCHLOROTHIAZIDE 25 MG TAB: 25 mg | ORAL | Qty: 1

## 2019-11-29 MED FILL — PANTOPRAZOLE 40 MG TAB, DELAYED RELEASE: 40 mg | ORAL | Qty: 1

## 2019-11-29 MED FILL — AMLODIPINE 5 MG TAB: 5 mg | ORAL | Qty: 2

## 2019-11-29 MED FILL — DOK 100 MG CAPSULE: 100 mg | ORAL | Qty: 1

## 2019-11-29 MED FILL — ASPIRIN 81 MG TAB, DELAYED RELEASE: 81 mg | ORAL | Qty: 1

## 2019-11-29 MED FILL — LOSARTAN 50 MG TAB: 50 mg | ORAL | Qty: 2

## 2019-11-29 MED FILL — INSULIN LISPRO 100 UNIT/ML INJECTION: 100 unit/mL | SUBCUTANEOUS | Qty: 1

## 2019-11-29 MED FILL — VANCOMYCIN 1,000 MG IV SOLR: 1000 mg | INTRAVENOUS | Qty: 1000

## 2019-11-29 MED FILL — OXYCODONE 5 MG TAB: 5 mg | ORAL | Qty: 2

## 2019-11-29 MED FILL — GABAPENTIN 300 MG CAP: 300 mg | ORAL | Qty: 1

## 2019-11-29 NOTE — Progress Notes (Signed)
Problem: Mobility Impaired (Adult and Pediatric)  Goal: *Acute Goals and Plan of Care (Insert Text)  Description: FUNCTIONAL STATUS PRIOR TO ADMISSION: Patient was independent and active without use of DME.    HOME SUPPORT PRIOR TO ADMISSION: The patient lived alone with no local support.    Physical Therapy  Reassessed post-surgery 11/29/2019  1.  Patient will move from supine to sit and sit to supine , scoot up and down, and roll side to side in bed with supervision/set-up within 7 day(s).    2.  Patient will transfer from bed to chair and chair to bed with supervision/set-up using the least restrictive device within 7 day(s).  3.  Patient will perform sit to stand with supervision/set-up within 7 day(s).  4.  Patient will ambulate with supervision/set-up for 50 feet with the least restrictive device within 7 day(s).   5.  Patient will ascend/descend 4 stairs with cane and one handrail(s) with supervision/set-up within 7 day(s).   Goals remain appropriate and will continue to work toward same.      Physical Therapy Goals  Initiated 11/26/2019  1.  Patient will move from supine to sit and sit to supine , scoot up and down, and roll side to side in bed with supervision/set-up within 7 day(s).    2.  Patient will transfer from bed to chair and chair to bed with supervision/set-up using the least restrictive device within 7 day(s).  3.  Patient will perform sit to stand with supervision/set-up within 7 day(s).  4.  Patient will ambulate with supervision/set-up for 50 feet with the least restrictive device within 7 day(s).   5.  Patient will ascend/descend 4 stairs with cane and one handrail(s) with supervision/set-up within 7 day(s).     Outcome: Progressing Towards Goal   PHYSICAL THERAPY REEVALUATION  Patient: Chatoya Stenger [id number] y.o. female)  Date: 11/29/2019  Primary Diagnosis: Gangrene ([place]) [I96]  Procedure(s) (LRB):  LEFT FEMORAL-TIBIAL BYPASS WITH VEIN (Left) 1 Day Post-Op   Precautions:  Fall, WBAT       ASSESSMENT  Based on the objective data described below, the patient presents as follows: Initial evaluation was on 11/26/2019 and patient had revascularization surgery on 11/28/2019. Goals remain appropriate and will continue to work toward same.    Patient is in a good deal of pain today. Was able to perform bed mobility, sitting on edge of bed for hygiene, transferring to bed side commode to urinate, then transfer back to bed (a few steps with RW and gait belt for bed-toilet-bed transfer). Patient is unable to tolerate hanging down the right foot and was unable to put any weight through LLE yet. Basically pivoted on R foot for transfer.  .  Current Level of Function Impacting Discharge (mobility/balance): Moderate assistance of 2 for all mobility. Tended to lean backward and required physical assistance to correct.    Functional Outcome Measure:  The patient scored 45/100 on the Barthel outcome measure which is indicative of moderate impaired ability to care for basic self-needs/dependency on others.         Other factors to consider for discharge: Lives alone/high pain levels/far from independent PLOF   Patient will benefit from skilled therapy intervention to address the above noted impairments.       PLAN :  Recommendations and Planned Interventions: bed mobility training, transfer training, gait training, therapeutic exercises, patient and family training/education, and therapeutic activities      Frequency/Duration: Patient will be followed by physical therapy:  5  times a week to address goals.    Recommendation for discharge: (in order for the patient to meet his/her long term goals)  To be determined: depending on progress while in the hospital. Patient is concerned about being able to function alone in her home.    This discharge recommendation:  A follow-up discussion with the attending provider and/or case management is planned    Equipment recommendations for successful discharge (if) home: to be  determined.         SUBJECTIVE:   Patient stated ???I am in so much pain!???    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 Goals remain appropriate and will continue to work toward same.    Personal factors and/or comorbidities impacting plan of care: PLOF: Independent with ADLs and IADLs.     Home Situation  Home Environment: Private residence  # Steps to Enter: 4  Rails to Enter: Yes  Hand Rails : Left  Wheelchair Ramp: No  One/Two Story Residence: One story  Living Alone: Yes  Support Systems: Child(ren)(Daughter lives in [place])  Patient Expects to be Discharged to:: Rehabilitation facility  Current DME Used/Available at Home: Kasandra Knudsen, straight  Tub or Shower Type: Tub/Shower combination    EXAMINATION/PRESENTATION/DECISION MAKING:   Critical Behavior:  Neurologic State: Alert  Orientation Level: Oriented X4  Cognition: Appropriate decision making, Appropriate for age attention/concentration, Appropriate safety awareness, Follows commands    Range Of Motion:  AROM: Generally decreased, functional           PROM: Generally decreased, functional           Strength:    Strength: Generally decreased, functional                    Tone & Sensation:   Tone: Normal              Sensation: Intact               Coordination:  Coordination: Within functional limits  Vision:      Functional Mobility:  Bed Mobility:     Supine to Sit: Moderate assistance;Assist x2  Sit to Supine: Moderate assistance;Assist x2  Scooting: Minimum assistance  Transfers:   Sit to Stand: Moderate assistance;Assist x2  Stand to Sit: Moderate assistance;Assist x2                       Balance:   Sitting: Impaired;Without support  Sitting - Static: Good (unsupported);Occassional  Sitting - Dynamic: Fair (occasional)  Standing: Impaired  Standing - Static: Fair;Constant support  Standing - Dynamic : Poor;Fair;Constant support  Ambulation/Gait Training:  Distance (ft): 3 Feet (ft)  Assistive Device: Walker, rolling;Gait belt  Ambulation - Level of Assistance: Moderate assistance;Adaptive equipment        Gait Abnormalities: Antalgic     Left Side Weight Bearing: As tolerated  Base of Support: Widened;Shift to right  Stance: Left decreased(unable to tolerate weight bearing on LLE)  Speed/Cadence: Pace decreased (<100 feet/min)  Step Length: Right shortened;Left shortened  Swing Pattern: Left asymmetrical            Therapeutic Exercises:   Instructed to continue gentle quad sets in order to prevent knee flexion contracture    Functional Measure:  Barthel Index:    Bathing: 0  Bladder: 10  Bowels: 10  Grooming: 5  Dressing: 0  Feeding: 10  Mobility: 0  Stairs: 0  Toilet Use: 5  Transfer (Bed to Chair and Back): 5  Total: 45/100       The Barthel ADL Index: Guidelines  1. The index should be used as a record of what a patient does, not as a record of what a patient could do.  2. The main aim is to establish degree of independence from any help, physical or verbal, however minor and for whatever reason.  3. The need for supervision renders the patient not independent.  4. A patient's performance should be established using the best available evidence. Asking the patient, friends/relatives and nurses are the usual sources, but direct observation and common sense are also important. However direct testing is not needed.  5. Usually the patient's performance over the preceding 24-48 hours is important, but occasionally longer periods will be relevant.  6. Middle categories imply that the patient  supplies over 50 per cent of the effort.  7. Use of aids to be independent is allowed.    Daneen Schick., Barthel, D.W. [id number]). Functional evaluation: the Barthel Index. [place] (14)2.  Lucianne Lei der [place], J.J.M.F, [place], Diona Browner., Oris Drone., [place], [place] (1999). Measuring the change indisability after inpatient rehabilitation; comparison of the responsiveness of the Barthel Index and Functional Independence Measure. Journal of Neurology, Neurosurgery, and Psychiatry, 66(4), 9156764927.  Wilford Sports, N.J.A, Scholte op [place],  W.J.M, & Koopmanschap, M.A. (2004.) Assessment of post-stroke quality of life in cost-effectiveness studies: The usefulness of the Barthel Index and the EuroQoL-5D. Quality of Life Research, 13, 427-43           Pain Rating:  7/10    Activity Tolerance:   Fair-limited due to pain    After treatment patient left in no apparent distress:   Supine in bed, Heels elevated for pressure relief, Call bell within reach, Side rails x 3, and nurse notified.    COMMUNICATION/EDUCATION:   The patient???s plan of care was discussed with: Registered nurse.     Fall prevention education was provided and the patient/caregiver indicated understanding., Patient/family have participated as able in goal setting and plan of care., and Patient/family agree to work toward stated goals and plan of care.    Thank you for this referral.  Hilda Lias

## 2019-11-29 NOTE — Progress Notes (Signed)
Bedside and Verbal shift change report given to [place] (oncoming nurse) by Estill Bamberg RN (offgoing nurse). Report included the following information SBAR, Kardex and MAR.

## 2019-11-29 NOTE — Progress Notes (Addendum)
Bedside shift change report given to Alyse Low, Therapist, sports (Soil scientist) by Otila Kluver, RN (offgoing nurse). Report included the following information SBAR, Kardex, Intake/Output, MAR and Recent Results.     Student was appropriately supervised during medication administration by primary Registered Nurse.

## 2019-11-29 NOTE — Progress Notes (Signed)
Problem: Mobility Impaired (Adult and Pediatric)  Goal: *Acute Goals and Plan of Care (Insert Text)  Description: FUNCTIONAL STATUS PRIOR TO ADMISSION: Patient was independent and active without use of DME.    HOME SUPPORT PRIOR TO ADMISSION: The patient lived alone with no local support.    Physical Therapy  Reassessed post-surgery 11/29/2019  1.  Patient will move from supine to sit and sit to supine , scoot up and down, and roll side to side in bed with supervision/set-up within 7 day(s).    2.  Patient will transfer from bed to chair and chair to bed with supervision/set-up using the least restrictive device within 7 day(s).  3.  Patient will perform sit to stand with supervision/set-up within 7 day(s).  4.  Patient will ambulate with supervision/set-up for 50 feet with the least restrictive device within 7 day(s).   5.  Patient will ascend/descend 4 stairs with cane and one handrail(s) with supervision/set-up within 7 day(s).   Goals remain appropriate and will continue to work toward same.      Physical Therapy Goals  Initiated 11/26/2019  1.  Patient will move from supine to sit and sit to supine , scoot up and down, and roll side to side in bed with supervision/set-up within 7 day(s).    2.  Patient will transfer from bed to chair and chair to bed with supervision/set-up using the least restrictive device within 7 day(s).  3.  Patient will perform sit to stand with supervision/set-up within 7 day(s).  4.  Patient will ambulate with supervision/set-up for 50 feet with the least restrictive device within 7 day(s).   5.  Patient will ascend/descend 4 stairs with cane and one handrail(s) with supervision/set-up within 7 day(s).     Outcome: Progressing Towards Goal   PHYSICAL THERAPY REEVALUATION  Patient: Lindsey Huynh (69 y.o. 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 Goals remain appropriate and will continue to work toward same.    Patient is in a good deal of pain today. Was able to perform bed mobility, sitting on edge of bed for hygiene, transferring to bed side commode to urinate, then transfer back to bed (a few steps with RW and gait belt for bed-toilet-bed transfer). Patient is unable to tolerate hanging down the right foot and was unable to put any weight through LLE yet. Basically pivoted on R foot for transfer.  .  Current Level of Function Impacting Discharge (mobility/balance): Moderate assistance of 2 for all mobility. Tended to lean backward and required physical assistance to correct.    Functional Outcome Measure:  The patient scored 45/100 on the Barthel outcome measure which is indicative of moderate impaired ability to care for basic self-needs/dependency on others.         Other factors to consider for discharge: Lives alone/high pain levels/far from independent PLOF   Patient will benefit from skilled therapy intervention to address the above noted impairments.       PLAN :  Recommendations and Planned Interventions: bed mobility training, transfer training, gait training, therapeutic exercises, patient and family training/education, and therapeutic activities      Frequency/Duration: Patient will be followed by physical therapy:  5  times a week to address goals.    Recommendation for discharge: (in order for the patient to meet his/her long term goals)  To be determined: depending on progress while in the hospital. Patient is concerned about being able to function alone in her home.    This discharge recommendation:  A follow-up discussion with the attending provider and/or case management is planned    Equipment recommendations for successful discharge (if) home: to  be determined.         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 Goals remain appropriate and will continue to work toward same.    Personal factors and/or comorbidities impacting plan of care: PLOF: Independent with ADLs and IADLs.     Home Situation  Home Environment: Private residence  # Steps to Enter: 4  Rails to Enter: Yes  Hand Rails : Left  Wheelchair Ramp: No  One/Two Story Residence: One story  Living Alone: Yes  Support Systems: Child(ren)(Daughter lives in [place])  Patient Expects to be Discharged to:: Rehabilitation facility  Current DME Used/Available at Home: Gilmer Mor, straight  Tub or Shower Type: Tub/Shower combination    EXAMINATION/PRESENTATION/DECISION MAKING:   Critical Behavior:  Neurologic State: Alert  Orientation Level: Oriented X4  Cognition: Appropriate decision making, Appropriate for age attention/concentration, Appropriate safety awareness, Follows commands    Range Of Motion:  AROM: Generally decreased, functional           PROM: Generally decreased, functional           Strength:    Strength: Generally decreased, functional                    Tone & Sensation:   Tone: Normal              Sensation: Intact               Coordination:  Coordination: Within functional limits  Vision:      Functional Mobility:  Bed Mobility:     Supine to Sit: Moderate assistance;Assist x2  Sit to Supine: Moderate assistance;Assist x2  Scooting: Minimum  assistance  Transfers:  Sit to Stand: Moderate assistance;Assist x2  Stand to Sit: Moderate assistance;Assist x2                       Balance:   Sitting: Impaired;Without support  Sitting - Static: Good (unsupported);Occassional  Sitting - Dynamic: Fair (occasional)  Standing: Impaired  Standing - Static: Fair;Constant support  Standing - Dynamic : Poor;Fair;Constant support  Ambulation/Gait Training:  Distance (ft): 3 Feet (ft)  Assistive Device: Walker, rolling;Gait belt  Ambulation - Level of Assistance: Moderate assistance;Adaptive equipment        Gait Abnormalities: Antalgic     Left Side Weight Bearing: As tolerated  Base of Support: Widened;Shift to right  Stance: Left decreased(unable to tolerate weight bearing on LLE)  Speed/Cadence: Pace decreased (<100 feet/min)  Step Length: Right shortened;Left shortened  Swing Pattern: Left asymmetrical            Therapeutic Exercises:   Instructed to continue gentle quad sets in order to prevent knee flexion contracture    Functional Measure:  Barthel Index:    Bathing: 0  Bladder: 10  Bowels: 10  Grooming: 5  Dressing: 0  Feeding: 10  Mobility: 0  Stairs: 0  Toilet Use: 5  Transfer (Bed to Chair and Back): 5  Total: 45/100       The Barthel ADL Index: Guidelines  1. The index should be used as a record of what a patient does, not as a record of what a patient could do.  2. The main aim is to establish degree of independence from any help, physical or verbal, however minor and for whatever reason.  3. The need for supervision renders the patient not independent.  4. A patient's performance should be established using the best available evidence. Asking the patient, friends/relatives and nurses are the usual sources, but direct observation and common sense are also important. However direct testing is not needed.  5. Usually the patient's performance over the preceding 24-48 hours is important, but occasionally longer periods will be relevant.  6. Middle categories  imply that the patient supplies over 50 per cent of the effort.  7. Use of aids to be independent is allowed.    Clarisa Kindred., Barthel, D.W. [phone]). Functional evaluation: the Barthel Index. Md State Med J (14)2.  Zenaida Niece der Eldred, J.J.M.F, [place], Ian Malkin., Margret Chance., [place], [place]. (1999). Measuring the change indisability after inpatient rehabilitation; comparison of the responsiveness of the Barthel Index and Functional Independence Measure. Journal of Neurology, Neurosurgery, and Psychiatry, 66(4), [phone].  Dawson Bills, N.J.A, [place],  W.J.M, & [place], M.A. (2004.) Assessment of post-stroke quality of life in cost-effectiveness studies: The usefulness of the Barthel Index and the EuroQoL-5D. Quality of Life Research, 13, 427-43           Pain Rating:  7/10    Activity Tolerance:   Fair-limited due to pain    After treatment patient left in no apparent distress:   Supine in bed, Heels elevated for pressure relief, Call bell within reach, Side rails x 3, and nurse notified.    COMMUNICATION/EDUCATION:   The patient's plan of care was discussed with: Registered nurse.     Fall prevention education was provided and the patient/caregiver indicated understanding., Patient/family have participated as able in goal setting and plan of care., and Patient/family agree to work toward stated goals and plan of care.    Thank you for this referral.  Billey Gosling

## 2019-11-29 NOTE — Progress Notes (Signed)
Bedside shift change report given to Alyse Low, Therapist, sports (Soil scientist) by Otila Kluver, RN (offgoing nurse). Report included the following information SBAR, Kardex, Intake/Output, MAR and Recent Results.     Student was appropriately supervised during medication administration by primary Registered Nurse.

## 2019-11-29 NOTE — Progress Notes (Signed)
Bedside and Verbal shift change report given to [place] (oncoming nurse) by Estill Bamberg RN (offgoing nurse). Report included the following information SBAR, Kardex and MAR.

## 2019-11-29 NOTE — Consults (Signed)
Consults  by [name], DPM at 11/29/19 1834                Author: [name], DPM  Service: Podiatry  Author Type: Physician       Filed: 11/29/19 1840  Date of Service: 11/29/19 1834  Status: Signed          Editor: [name], DPM (Physician)            Consult Orders        1. IP CONSULT TO PODIATRY [id number] ordered by Belenda Cruise, MD at 11/29/19 [id number]                                                                                    Francoise Schaumann, DPM - Despina Pole. Posey Pronto, DPM                                    [name], DPM - [name], DPM                                                   Podiatric Surgery - Progress Note   Assessment/Plan:   ulcer of left 1st and 2nd toe, depth unknown, areas are dry and stable, but necrotic eschar. Pt is s/p fem pop bypass (vascular surgery following)    - Pt evaluated and tx. Dressing consisting of betadine/dsd applied, will order wound care recommendations, recommend MRI to rule out osteomyelitis.  Discussed risks such as need for further amputation/slow wound healing and benefits such as treatment of ulceration. Since necrosis appears to be limited to soft tissue on physical exam, I would recommend local wound care.  Pt can f/u with [place] at the [facility] [phone], or at our private office.         Thank you for this consultation.  Do not hesitate to contact us via [employer] or phone with any questions.      Subjective:   Pt complains of wound to left 1, 2 toes.  Previous tx include none.  negative for fever, chills, nausea, vomiting, chest pain, shortness of breath.        HPI: relates significant pain prior to recent bypass and today bedside, relates  she does not think she will able to go home due to pain and limited mobility      ROS:   Consitutional: no weight loss, night sweats, fatigue / malaise / lethargy.   Musculoskeletal: no joint / extremity pain, misalignment, stiffness, decreased ROM,  crepitus.   Integument: No pruritis, rashes, lesions, left toe wounds.   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  Neuromuscular Blockers, Steroidal  Other (comments)  GI Upset        History reviewed. No pertinent family history.    @HXMEDICAL @     Past Surgical History:         Procedure  Laterality  Date          ?  HX COLONOSCOPY         ?  HX ORTHOPAEDIC    06-30-10          back surgery (tumor removed)          ?  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  Smoking status:  Former Smoker              Years:  10.00         ?  Smokeless tobacco:  Never Used       Substance Use Topics         ?  Alcohol use:  Yes              Alcohol/week:  5.0 standard drinks              Types:  6 Cans of beer per week            Social History          Substance and Sexual Activity        Alcohol Use  Yes         ?  Alcohol/week:  5.0 standard drinks         ?  Types:  6 Cans of beer per week          Social History          Substance and Sexual Activity        Drug Use  No           Social History          Tobacco Use        Smoking Status  Former Smoker         ?  Years:  10.00        Smokeless Tobacco  Never Used          Current Facility-Administered Medications          Medication  Dose  Route  Frequency           ?  morphine injection 2 mg   2 mg  IntraVENous  Q4H PRN     ?  sodium chloride (NS) flush 5-40 mL   5-40 mL  IntraVENous  Q8H     ?  sodium chloride (NS) flush 5-40 mL   5-40 mL  IntraVENous  PRN     ?  vancomycin (VANCOCIN) 1,000 mg in 0.9% sodium chloride 250 mL (VIAL-MATE)   1,000 mg  IntraVENous  Q16H     ?  sodium phosphate (FLEET'S) enema 1 Enema   1 Enema  Rectal  PRN     ?  oxyCODONE IR (ROXICODONE) tablet 5 mg   5 mg  Oral  Q4H PRN     ?  insulin glargine (LANTUS) injection 16 Units   16 Units  SubCUTAneous  QHS           ?   Vancomycin- Pharmacy  to Dose     Other  Rx Dosing/Monitoring           ?  gabapentin (NEURONTIN) capsule 300 mg   300 mg  Oral  QHS     ?  losartan (COZAAR) tablet 100 mg   100 mg  Oral  DAILY     ?  docusate sodium (COLACE) capsule 100 mg   100 mg  Oral  BID     ?  senna (SENOKOT) tablet 8.6 mg   1 Tab  Oral  DAILY     ?  polyethylene glycol (MIRALAX) packet 17 g   17 g  Oral  DAILY PRN     ?  bisacodyL (DULCOLAX) suppository 10 mg   10 mg  Rectal  DAILY PRN     ?  hydroCHLOROthiazide (HYDRODIURIL) tablet 25 mg   25 mg  Oral  DAILY     ?  sodium chloride (NS) flush 5-40 mL   5-40 mL  IntraVENous  Q8H     ?  sodium chloride (NS) flush 5-40 mL   5-40 mL  IntraVENous  PRN     ?  acetaminophen (TYLENOL) tablet 650 mg   650 mg  Oral  Q6H PRN          Or           ?  acetaminophen (TYLENOL) suppository 650 mg   650 mg  Rectal  Q6H PRN     ?  promethazine (PHENERGAN) tablet 12.5 mg   12.5 mg  Oral  Q6H PRN          Or           ?  ondansetron (ZOFRAN) injection 4 mg   4 mg  IntraVENous  Q6H PRN     ?  glucose chewable tablet 16 g   4 Tab  Oral  PRN     ?  dextrose (D50W) injection syrg 12.5-25 g   25-50 mL  IntraVENous  PRN     ?  glucagon (GLUCAGEN) injection 1 mg   1 mg  IntraMUSCular  PRN     ?  insulin lispro (HUMALOG) injection     SubCUTAneous  AC&HS     ?  aspirin delayed-release tablet 81 mg   81 mg  Oral  DAILY     ?  pantoprazole (PROTONIX) tablet 40 mg   40 mg  Oral  ACB           ?  amLODIPine (NORVASC) tablet 10 mg   10 mg  Oral  DAILY           ?  enoxaparin (LOVENOX) injection 40 mg   40 mg  SubCUTAneous  Q24H            Objective:   Vitals:    Patient Vitals for the past 12 hrs:            BP  Temp  Pulse  Resp  SpO2            11/29/19 1544  114/64  98.9 ??F (37.2 ??C)  83  16  97 %            11/29/19 0831  128/73  99.7 ??F (37.6 ??C)  98  16  98 %           Vascular:   B/L LE   DP 0/4; PT 0/4   capillary fill time brisk, pitting edema is present, skin temperature  is cool, varicosities are present.       Dermatological:   Nails are thickened, discolored, painful to palpation, 74mm thick, with subungual debris.              There is no maceration of the interspaces of the feet b/l.  No focal hyperkeratosis      Wound: 1, 2    Location: left distal hallux and 2nd toe   Margins: flush   Drainage: none   Odor: none   Wound base: necrotic, dry   Lymphangitic streaking? No.   Undermining? No.   Sinus tracts? No.   Exposed bone? No.   Subcutaneous crepitation on palpation? No.      Neurological:   DTR are present, protective sensation per 5.07 Semmes Weinstein monofilament is absent, patient is AAOx3, mood is normal. Epicritic sensation is intact.      Orthopedic:   B/L LE are symmetric, ROM of ankle, STJ, 1st MTPJ is limited, MMT 5 out of 5 for B/L LE.  No pedal amputations noted      Constitutional: Pt is a well developed elderly female.            Imaging:   CT scan negative for osteomyelitis   Labs:     Recent Labs           11/29/19   0454     WBC  10.1     CREA  0.77     BUN  10     HGB  9.5*     HCT  29.0*     NA  138     K  3.8     CL  107     CO2  24        GLU  89

## 2019-11-29 NOTE — Progress Notes (Signed)
Vascular:    Stable post left leg bypass. Pain controlled. Foot pain improved.    Leg dressed, palpable graft pulse    Continue current care, wound care to toes and continue antibiotics.    DC Foley and start PT    Toe wounds examined at time of surgery and are somewhat worse than anticipated - will ask Podiatry to see

## 2019-11-29 NOTE — Progress Notes (Signed)
Progress Notes by Hollice Espy, NP at 11/29/19 1000                Author: Hollice Espy, NP  Service: Nurse Practitioner  Author Type: Acute Care Nurse Practitioner       Filed: 11/29/19 1010  Date of Service: 11/29/19 1000  Status: Signed           Editor: Hollice Espy, NP (Acute Care Nurse Practitioner)  Cosigner: Venida Jarvis, MD at 11/30/19 [facility]                                                                                              Hospitalist Progress Note   Hollice Espy, NP   Answering service: [phone] OR [phone] from in house phone            Date of Service:  11/29/2019   NAME:  Lindsey Huynh   DOB:  07/26/1951   MRN:  [id number]           Admission Summary:        From H&P 11/23/2019:   "Marchia Berres??is a 69 y.o.??female??with past medical history of diabetes, insulin-dependent, GERD and hypertension comes for return of left foot pain and swelling.   Patient is awake, alert and oriented able to answer my question follow my request, data also obtained in the ED staff and extensive chart review. ??As per collective reports, patient??left  great toe pain which associated darkening of the skin on the distal end of the toe and swelling of the left foot the medial aspect of the Of the last 1 week and has been progressively getting worse. ??The only thing that he remembers  was fungal nail cream. ??She reported since her left second toe is in contact with the first toe and that pain he also became slightly cellulitic, she also has some dorsal swelling of the foot as well. ??This all progress from being mildly achy to  significant tender and swollen last 1 week and she came today because the pain was unbearable, 10/10, getting from her toe to her foot and made her ambulation significantly difficult. ??Although she continues to have pain at rest. ??She has not seek  medical help for that until today, did not remember any  trauma or injury to her feet. ??She is hemodynamically otherwise stable in the ED on her own. ??Patient was hemodynamically stable in the ED, and work revealed hypokalemia leukocytosis and x-ray  the foot was without any acute abnormality. ??Given concerns for dry gangrene and cellulitis, she was started on IV vancomycin. Patient has no other medical complaints. ??Patient will be admitted to hospitalist??service for evaluation management."      Interval history / Subjective:         F/U for left foot cellulitis, left great toe gangrene, DMII, HTN, GERD, Hypokalemia, left popliteal occlusion     Patient seen and examined. States that she is feeling ok today.  S/p Fem-Pop bypass of left leg (03/11)- Tender to touch. Pain is controlled. Dressings noted to left groin, leg, and foot that are clean, dry, and intact    No overnight events noted. No new complaints noted.      Assessment & Plan:          LLE pain:??dry gangrene??of??left 2nd toe cellulitic changes/Left popliteal occlusion   -s/p left fem-pop bypass (03/11)   -CT LLE (03/06) NL   -Elevated Sed rate and CRP   -BC (03/06) NGTD    -Pain control   - Podiatry consulted.    ??   DMII:??hold metformin while IP   -Cont basal insulin, SSI   -Diabetic diet   -a1c 8.1   - DM following    ??   HTN   -Controlled   -Cont home meds    ??   GERD   -Cont PPI   ??   Hypokalemia   -Resolved         Code status: Full    DVT prophylaxis: Lovenox      Care Plan discussed with: Patient/Family, Nurse and Case Manager   Anticipated Disposition: Home w/Family   Anticipated Discharge: 24 hours to 48 hours           Hospital Problems   Date Reviewed:  05/31/2018                         Codes  Class  Noted  POA              Gangrene ([place])  ICD-10-CM: [id number]   ICD-9-CM: 785.4    11/23/2019  Unknown                               Review of Systems:     A comprehensive review of systems was negative except for that written in the HPI.            Vital Signs:      Last 24hrs VS reviewed since prior progress  note. Most recent are:   Visit Vitals      BP  128/73 (BP 1 Location: Right upper arm, BP Patient Position: At rest)     Pulse  98     Temp  99.7 ??F (37.6 ??C)     Resp  16     Ht  5\' 5"  (1.651 m)     Wt  76.9 kg (169 lb 8 oz)     SpO2  98%        BMI  28.21 kg/m??              Intake/Output Summary (Last 24 hours) at 11/29/2019 1001   Last data filed at 11/29/2019 0448     Gross per 24 hour        Intake  1770 ml        Output  840 ml        Net  930 ml              Physical Examination:                     Constitutional:   No acute distress, cooperative, pleasant, answers questions      ENT:   Oral mucosa moist, oropharynx benign.      Resp:   CTA bilaterally on room air. No wheezing/rhonchi/rales. No accessory muscle  use     CV:   Regular rhythm, normal rate, no murmurs, gallops, rubs      GI:   Soft, non distended, non tender. normoactive bowel sounds      Musculoskeletal:   LLE +1 edema         Neurologic:   Moves all extremities.  AAOx3, CN II-XII reviewed       Psych:  Good insight, not anxious or agitated                     Skin: LLE medial large scabbed ulcer; right great toe erythematous, swollen, small area dry gangrene noted. Left groin, leg and foot dressing noted- clean, dry, and intact                Data Review:      Review and/or order of clinical lab test   Review and/or order of tests in the medicine section of CPT           Labs:          Recent Labs            11/29/19   0454  11/28/19   0217     WBC  10.1  8.4     HGB  9.5*  11.4*     HCT  29.0*  33.8*         PLT  336  393          Recent Labs             11/29/19   0454  11/28/19   0217  11/27/19   0500     NA  138  139  137     K  3.8  3.5  3.2*     CL  107  105  103     CO2  24  25  28      BUN  10  15  13      CREA  0.77  0.97  0.74     GLU  89  124*  66          CA  9.5  10.4*  9.7        No results for input(s): ALT, AP, TBIL, TBILI, TP, ALB, GLOB, GGT, AML, LPSE in the last 72 hours.      No lab exists for component: SGOT, GPT, AMYP,  HLPSE   No results for input(s): INR, PTP, APTT, INREXT in the last 72 hours.    No results for input(s): FE, TIBC, PSAT, FERR in the last 72 hours.    No results found for: FOL, RBCF    No results for input(s): PH, PCO2, PO2 in the last 72 hours.   No results for input(s): CPK, CKNDX, TROIQ in the last 72 hours.      No lab exists for component: CPKMB     Lab Results         Component  Value  Date/Time            Cholesterol, total  219 (H)  10/30/2018 10:27 AM       HDL Cholesterol  63  10/30/2018 10:27 AM       LDL, calculated  130 (H)  10/30/2018 10:27 AM       Triglyceride  131  10/30/2018 10:27 AM            CHOL/HDL Ratio  3.4  12/26/2007 09:12 AM  Lab Results         Component  Value  Date/Time            Glucose (POC)  75  11/29/2019 06:56 AM       Glucose (POC)  152 (H)  11/28/2019 09:04 PM       Glucose (POC)  138 (H)  11/28/2019 04:05 PM       Glucose (POC)  78  11/28/2019 02:45 PM            Glucose (POC)  116 (H)  11/28/2019 06:42 AM        No results found for: COLOR, APPRN, SPGRU, REFSG, PHU, PROTU, GLUCU, KETU, BILU, UROU, NITU, LEUKU, GLUKE, EPSU, BACTU, WBCU, RBCU, CASTS, UCRY           Medications Reviewed:          Current Facility-Administered Medications          Medication  Dose  Route  Frequency           ?  morphine injection 2 mg   2 mg  IntraVENous  Q4H PRN     ?  sodium chloride (NS) flush 5-40 mL   5-40 mL  IntraVENous  Q8H     ?  sodium chloride (NS) flush 5-40 mL   5-40 mL  IntraVENous  PRN     ?  vancomycin (VANCOCIN) 1,000 mg in 0.9% sodium chloride 250 mL (VIAL-MATE)   1,000 mg  IntraVENous  Q16H     ?  sodium phosphate (FLEET'S) enema 1 Enema   1 Enema  Rectal  PRN     ?  oxyCODONE IR (ROXICODONE) tablet 5 mg   5 mg  Oral  Q4H PRN     ?  insulin glargine (LANTUS) injection 16 Units   16 Units  SubCUTAneous  QHS     ?  Vancomycin- Pharmacy to Dose     Other  Rx Dosing/Monitoring     ?  gabapentin (NEURONTIN) capsule 300 mg   300 mg  Oral  QHS     ?  losartan (COZAAR) tablet  100 mg   100 mg  Oral  DAILY     ?  docusate sodium (COLACE) capsule 100 mg   100 mg  Oral  BID     ?  senna (SENOKOT) tablet 8.6 mg   1 Tab  Oral  DAILY     ?  polyethylene glycol (MIRALAX) packet 17 g   17 g  Oral  DAILY PRN     ?  bisacodyL (DULCOLAX) suppository 10 mg   10 mg  Rectal  DAILY PRN     ?  hydroCHLOROthiazide (HYDRODIURIL) tablet 25 mg   25 mg  Oral  DAILY     ?  sodium chloride (NS) flush 5-40 mL   5-40 mL  IntraVENous  Q8H     ?  sodium chloride (NS) flush 5-40 mL   5-40 mL  IntraVENous  PRN     ?  acetaminophen (TYLENOL) tablet 650 mg   650 mg  Oral  Q6H PRN          Or           ?  acetaminophen (TYLENOL) suppository 650 mg   650 mg  Rectal  Q6H PRN     ?  promethazine (PHENERGAN) tablet 12.5 mg   12.5 mg  Oral  Q6H PRN  Or           ?  ondansetron (ZOFRAN) injection 4 mg   4 mg  IntraVENous  Q6H PRN           ?  glucose chewable tablet 16 g   4 Tab  Oral  PRN     ?  dextrose (D50W) injection syrg 12.5-25 g   25-50 mL  IntraVENous  PRN     ?  glucagon (GLUCAGEN) injection 1 mg   1 mg  IntraMUSCular  PRN     ?  insulin lispro (HUMALOG) injection     SubCUTAneous  AC&HS     ?  aspirin delayed-release tablet 81 mg   81 mg  Oral  DAILY     ?  pantoprazole (PROTONIX) tablet 40 mg   40 mg  Oral  ACB     ?  amLODIPine (NORVASC) tablet 10 mg   10 mg  Oral  DAILY           ?  enoxaparin (LOVENOX) injection 40 mg   40 mg  SubCUTAneous  Q24H        ______________________________________________________________________   EXPECTED LENGTH OF STAY: 3d 2h   ACTUAL LENGTH OF STAY:          [place], NP

## 2019-11-29 NOTE — Progress Notes (Signed)
[facility]                                                                                          Hospitalist Progress Note  Lindsey Espy, NP  Answering service: [phone] OR [phone] from in house phone        Date of Service:  11/29/2019  NAME:  Lindsey Huynh  DOB:  Oct 30, 1950  MRN:  [id number]      Admission Summary:   From H&P 11/23/2019:  "Lindsey Huynh??is a 69 y.o.??female??with past medical history of diabetes, insulin-dependent, GERD and hypertension comes for return of left foot pain and swelling.  Patient is awake, alert and oriented able to answer my question follow my request, data also obtained in the ED staff and extensive chart review. ??As per collective reports, patient??left great toe pain which associated darkening of the skin on the distal end of the toe and swelling of the left foot the medial aspect of the Of the last 1 week and has been progressively getting worse. ??The only thing that he remembers was fungal nail cream. ??She reported since her left second toe is in contact with the first toe and that pain he also became slightly cellulitic, she also has some dorsal swelling of the foot as well. ??This all progress from being mildly achy to significant tender and swollen last 1 week and she came today because the pain was unbearable, 10/10, getting from her toe to her foot and made her ambulation significantly difficult. ??Although she continues to have pain at rest. ??She has not seek medical help for that until today, did not remember any trauma or injury to her feet. ??She is hemodynamically otherwise stable in the ED on her own. ??Patient was hemodynamically stable in the ED, and work revealed hypokalemia leukocytosis and x-ray the foot was without any acute abnormality. ??Given concerns for dry gangrene and cellulitis, she was started on IV vancomycin. Patient has no other medical complaints. ??Patient will be admitted to hospitalist??service for  evaluation management."    Interval history / Subjective:    F/U for left foot cellulitis, left great toe gangrene, DMII, HTN, GERD, Hypokalemia, left popliteal occlusion   Patient seen and examined. States that she is feeling ok today.   S/p Fem-Pop bypass of left leg (03/11)- Tender to touch. Pain is controlled. Dressings noted to left groin, leg, and foot that are clean, dry, and intact   No overnight events noted. No new complaints noted.   Assessment & Plan:     LLE pain:??dry gangrene??of??left 2nd toe cellulitic changes/Left popliteal occlusion  -s/p left fem-pop bypass (03/11)  -CT LLE (03/06) NL  -Elevated Sed rate and CRP  -BC (03/06) NGTD   -Pain control  - Podiatry consulted.   ??  DMII:??hold metformin while IP  -Cont basal insulin, SSI  -Diabetic diet  -a1c 8.1  - DM following   ??  HTN  -Controlled  -Cont home meds   ??  GERD  -Cont PPI  ??  Hypokalemia  -Resolved      Code status: Full   DVT prophylaxis:  Lovenox    Care Plan discussed with: Patient/Family, Nurse and Case Manager  Anticipated Disposition: Home w/Family  Anticipated Discharge: 24 hours to 48 hours     Hospital Problems  Date Reviewed: 30-Jun-2018          Codes Class Noted POA    Gangrene ([place]) ICD-10-CM: [id number]  ICD-9-CM: 785.4  11/23/2019 Unknown                Review of Systems:   A comprehensive review of systems was negative except for that written in the HPI.       Vital Signs:    Last 24hrs VS reviewed since prior progress note. Most recent are:  Visit Vitals  BP 128/73 (BP 1 Location: Right upper arm, BP Patient Position: At rest)   Pulse 98   Temp 99.7 ??F (37.6 ??C)   Resp 16   Ht 5\' 5"  (1.651 m)   Wt 76.9 kg (169 lb 8 oz)   SpO2 98%   BMI 28.21 kg/m??         Intake/Output Summary (Last 24 hours) at 11/29/2019 1001  Last data filed at 11/29/2019 0448  Gross per 24 hour   Intake 1770 ml   Output 840 ml   Net 930 ml        Physical Examination:             Constitutional:  No acute distress, cooperative, pleasant, answers questions    ENT:  Oral  mucosa moist, oropharynx benign.    Resp:  CTA bilaterally on room air. No wheezing/rhonchi/rales. No accessory muscle use   CV:  Regular rhythm, normal rate, no murmurs, gallops, rubs    GI:  Soft, non distended, non tender. normoactive bowel sounds    Musculoskeletal:  LLE +1 edema    Neurologic:  Moves all extremities.  AAOx3, CN II-XII reviewed     Psych:  Good insight, not anxious or agitated                    Skin: LLE medial large scabbed ulcer; right great toe erythematous, swollen, small area dry gangrene noted. Left groin, leg and foot dressing noted- clean, dry, and intact        Data Review:    Review and/or order of clinical lab test  Review and/or order of tests in the medicine section of CPT      Labs:     Recent Labs     11/29/19  0454 11/28/19  0217   WBC 10.1 8.4   HGB 9.5* 11.4*   HCT 29.0* 33.8*   PLT 336 393     Recent Labs     11/29/19  0454 11/28/19  0217 11/27/19  0500   NA 138 139 137   K 3.8 3.5 3.2*   CL 107 105 103   CO2 24 25 28    BUN 10 15 13    CREA 0.77 0.97 0.74   GLU 89 124* 66   CA 9.5 10.4* 9.7     No results for input(s): ALT, AP, TBIL, TBILI, TP, ALB, GLOB, GGT, AML, LPSE in the last 72 hours.    No lab exists for component: SGOT, GPT, AMYP, HLPSE  No results for input(s): INR, PTP, APTT, INREXT in the last 72 hours.   No results for input(s): FE, TIBC, PSAT, FERR in the last 72 hours.   No results found for: FOL, RBCF   No results for input(s): PH, PCO2, PO2  in the last 72 hours.  No results for input(s): CPK, CKNDX, TROIQ in the last 72 hours.    No lab exists for component: CPKMB  Lab Results   Component Value Date/Time    Cholesterol, total 219 (H) 10/30/2018 10:27 AM    HDL Cholesterol 63 10/30/2018 10:27 AM    LDL, calculated 130 (H) 10/30/2018 10:27 AM    Triglyceride 131 10/30/2018 10:27 AM    CHOL/HDL Ratio 3.4 12/26/2007 09:12 AM     Lab Results   Component Value Date/Time    Glucose (POC) 75 11/29/2019 06:56 AM    Glucose (POC) 152 (H) 11/28/2019 09:04 PM    Glucose  (POC) 138 (H) 11/28/2019 04:05 PM    Glucose (POC) 78 11/28/2019 02:45 PM    Glucose (POC) 116 (H) 11/28/2019 06:42 AM     No results found for: COLOR, APPRN, SPGRU, REFSG, PHU, PROTU, GLUCU, KETU, BILU, UROU, NITU, LEUKU, GLUKE, EPSU, BACTU, WBCU, RBCU, CASTS, UCRY      Medications Reviewed:     Current Facility-Administered Medications   Medication Dose Route Frequency   ??? morphine injection 2 mg  2 mg IntraVENous Q4H PRN   ??? sodium chloride (NS) flush 5-40 mL  5-40 mL IntraVENous Q8H   ??? sodium chloride (NS) flush 5-40 mL  5-40 mL IntraVENous PRN   ??? vancomycin (VANCOCIN) 1,000 mg in 0.9% sodium chloride 250 mL (VIAL-MATE)  1,000 mg IntraVENous Q16H   ??? sodium phosphate (FLEET'S) enema 1 Enema  1 Enema Rectal PRN   ??? oxyCODONE IR (ROXICODONE) tablet 5 mg  5 mg Oral Q4H PRN   ??? insulin glargine (LANTUS) injection 16 Units  16 Units SubCUTAneous QHS   ??? Vancomycin- Pharmacy to Dose   Other Rx Dosing/Monitoring   ??? gabapentin (NEURONTIN) capsule 300 mg  300 mg Oral QHS   ??? losartan (COZAAR) tablet 100 mg  100 mg Oral DAILY   ??? docusate sodium (COLACE) capsule 100 mg  100 mg Oral BID   ??? senna (SENOKOT) tablet 8.6 mg  1 Tab Oral DAILY   ??? polyethylene glycol (MIRALAX) packet 17 g  17 g Oral DAILY PRN   ??? bisacodyL (DULCOLAX) suppository 10 mg  10 mg Rectal DAILY PRN   ??? hydroCHLOROthiazide (HYDRODIURIL) tablet 25 mg  25 mg Oral DAILY   ??? sodium chloride (NS) flush 5-40 mL  5-40 mL IntraVENous Q8H   ??? sodium chloride (NS) flush 5-40 mL  5-40 mL IntraVENous PRN   ??? acetaminophen (TYLENOL) tablet 650 mg  650 mg Oral Q6H PRN    Or   ??? acetaminophen (TYLENOL) suppository 650 mg  650 mg Rectal Q6H PRN   ??? promethazine (PHENERGAN) tablet 12.5 mg  12.5 mg Oral Q6H PRN    Or   ??? ondansetron (ZOFRAN) injection 4 mg  4 mg IntraVENous Q6H PRN   ??? glucose chewable tablet 16 g  4 Tab Oral PRN   ??? dextrose (D50W) injection syrg 12.5-25 g  25-50 mL IntraVENous PRN   ??? glucagon (GLUCAGEN) injection 1 mg  1 mg IntraMUSCular PRN   ???  insulin lispro (HUMALOG) injection   SubCUTAneous AC&HS   ??? aspirin delayed-release tablet 81 mg  81 mg Oral DAILY   ??? pantoprazole (PROTONIX) tablet 40 mg  40 mg Oral ACB   ??? amLODIPine (NORVASC) tablet 10 mg  10 mg Oral DAILY   ??? enoxaparin (LOVENOX) injection 40 mg  40 mg SubCUTAneous Q24H     ______________________________________________________________________  EXPECTED LENGTH OF STAY: 3d 2h  ACTUAL LENGTH OF STAY:          [place], NP

## 2019-11-29 NOTE — Consults (Signed)
Francoise Schaumann, DPM - Greig Castilla K. 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 Pt is s/p fem pop bypass (vascular surgery following)   ??? Pt evaluated and tx. Dressing consisting of betadine/dsd applied, will order wound care recommendations, recommend MRI to rule out osteomyelitis. Discussed risks such as need for further amputation/slow wound healing and benefits such as treatment of ulceration. Since necrosis appears to be limited to soft tissue on physical exam, I would recommend local wound care.  Pt can f/u with [place] at the [facility] [phone], or at our private office.      Thank you for this consultation.  Do not hesitate to contact us via [employer] or phone with any questions.    Subjective:  Pt complains of wound to left 1, 2 toes.  Previous tx include none.  negative for fever, chills, nausea, vomiting, chest pain, shortness of breath.      HPI: relates significant pain prior to recent bypass and today bedside, relates she does not think she will able to go home due to pain and limited mobility    ROS:  Consitutional: no weight loss, night sweats, fatigue / malaise / lethargy.  Musculoskeletal: no joint / extremity pain, misalignment, stiffness, decreased ROM, crepitus.  Integument: No pruritis, rashes, lesions, left toe wounds.  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 Neuromuscular Blockers, Steroidal Other (comments)     GI Upset     History reviewed. No pertinent family history.   @HXMEDICAL @  Past Surgical History:   Procedure Laterality Date   ??? HX COLONOSCOPY     ??? HX ORTHOPAEDIC  06-30-10    back surgery (tumor removed)   ???  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 Smoking status: Former Smoker     Years: 10.00   ??? Smokeless tobacco: Never Used   Substance Use Topics   ??? Alcohol use: Yes     Alcohol/week: 5.0 standard drinks     Types: 6 Cans of beer per week       Social History     Substance and Sexual Activity   Alcohol Use Yes   ??? Alcohol/week: 5.0 standard drinks   ??? Types: 6 Cans of beer per week     Social History     Substance and Sexual Activity   Drug Use No      Social History     Tobacco Use   Smoking Status Former Smoker   ??? Years: 10.00   Smokeless Tobacco Never Used     Current Facility-Administered Medications   Medication Dose Route Frequency   ??? morphine injection 2 mg  2 mg IntraVENous Q4H  PRN   ??? sodium chloride (NS) flush 5-40 mL  5-40 mL IntraVENous Q8H   ??? sodium chloride (NS) flush 5-40 mL  5-40 mL IntraVENous PRN   ??? vancomycin (VANCOCIN) 1,000 mg in 0.9% sodium chloride 250 mL (VIAL-MATE)  1,000 mg IntraVENous Q16H   ??? sodium phosphate (FLEET'S) enema 1 Enema  1 Enema Rectal PRN   ??? oxyCODONE IR (ROXICODONE) tablet 5 mg  5 mg Oral Q4H PRN   ??? insulin glargine (LANTUS) injection 16 Units  16 Units SubCUTAneous QHS   ??? Vancomycin- Pharmacy to Dose   Other Rx Dosing/Monitoring   ??? gabapentin (NEURONTIN) capsule 300 mg  300 mg Oral QHS   ??? losartan (COZAAR) tablet 100 mg  100 mg Oral DAILY   ??? docusate sodium (COLACE) capsule 100 mg  100 mg Oral BID   ??? senna (SENOKOT) tablet 8.6 mg  1 Tab Oral DAILY   ??? polyethylene glycol (MIRALAX) packet 17 g  17 g Oral DAILY PRN   ??? bisacodyL (DULCOLAX) suppository 10 mg  10 mg Rectal DAILY PRN   ??? hydroCHLOROthiazide (HYDRODIURIL) tablet 25 mg  25 mg Oral DAILY   ??? sodium chloride (NS) flush 5-40 mL  5-40 mL IntraVENous Q8H   ??? sodium chloride (NS) flush 5-40 mL  5-40 mL IntraVENous PRN   ??? acetaminophen (TYLENOL) tablet 650 mg  650 mg Oral Q6H PRN    Or   ??? acetaminophen (TYLENOL) suppository 650 mg  650 mg Rectal Q6H PRN   ???  promethazine (PHENERGAN) tablet 12.5 mg  12.5 mg Oral Q6H PRN    Or   ??? ondansetron (ZOFRAN) injection 4 mg  4 mg IntraVENous Q6H PRN   ??? glucose chewable tablet 16 g  4 Tab Oral PRN   ??? dextrose (D50W) injection syrg 12.5-25 g  25-50 mL IntraVENous PRN   ??? glucagon (GLUCAGEN) injection 1 mg  1 mg IntraMUSCular PRN   ??? insulin lispro (HUMALOG) injection   SubCUTAneous AC&HS   ??? aspirin delayed-release tablet 81 mg  81 mg Oral DAILY   ??? pantoprazole (PROTONIX) tablet 40 mg  40 mg Oral ACB   ??? amLODIPine (NORVASC) tablet 10 mg  10 mg Oral DAILY   ??? enoxaparin (LOVENOX) injection 40 mg  40 mg SubCUTAneous Q24H        Objective:  Vitals:   Patient Vitals for the past 12 hrs:   BP Temp Pulse Resp SpO2   11/29/19 1544 114/64 98.9 ??F (37.2 ??C) 83 16 97 %   11/29/19 0831 128/73 99.7 ??F (37.6 ??C) 98 16 98 %       Vascular:  B/L LE  DP 0/4; PT 0/4  capillary fill time brisk, pitting edema is present, skin temperature is cool, varicosities are present.    Dermatological:  Nails are thickened, discolored, painful to palpation, 31mm thick, with subungual debris.          There is no maceration of the interspaces of the feet b/l.  No focal hyperkeratosis    Wound: 1, 2   Location: left distal hallux and 2nd toe  Margins: flush  Drainage: none  Odor: none  Wound base: necrotic, dry  Lymphangitic streaking? No.  Undermining? No.  Sinus tracts? No.  Exposed bone? No.  Subcutaneous crepitation on palpation? No.    Neurological:  DTR are present, protective sensation per 5.07 Semmes Weinstein monofilament is absent, patient is AAOx3, mood is normal. Epicritic sensation is intact.    Orthopedic:  B/L LE are symmetric, ROM of ankle, STJ, 1st MTPJ is limited, MMT 5 out of 5 for B/L LE.  No pedal amputations noted    Constitutional: Pt is a well developed elderly female.        Imaging:  CT scan negative for osteomyelitis  Labs:  Recent Labs     11/29/19  0454   WBC 10.1   CREA 0.77   BUN 10   HGB 9.5*   HCT 29.0*   NA 138   K 3.8   CL 107    CO2 24   GLU 89

## 2019-11-29 NOTE — Progress Notes (Addendum)
Vascular:    Stable post left leg bypass. Pain controlled. Foot pain improved.    Leg dressed, palpable graft pulse    Continue current care, wound care to toes and continue antibiotics.    DC Foley and start PT    Toe wounds examined at time of surgery and are somewhat worse than anticipated - will ask Podiatry to see

## 2019-11-30 LAB — CBC WITH AUTOMATED DIFF
ABS. BASOPHILS: 0.1 10*3/uL (ref 0.0–0.1)
ABS. EOSINOPHILS: 0.2 10*3/uL (ref 0.0–0.4)
ABS. IMM. GRANS.: 0.1 10*3/uL — ABNORMAL HIGH (ref 0.00–0.04)
ABS. LYMPHOCYTES: 1.1 10*3/uL (ref 0.8–3.5)
ABS. MONOCYTES: 1.1 10*3/uL — ABNORMAL HIGH (ref 0.0–1.0)
ABS. NEUTROPHILS: 7.2 10*3/uL (ref 1.8–8.0)
ABSOLUTE NRBC: 0 10*3/uL (ref 0.00–0.01)
BASOPHILS: 1 % (ref 0–1)
EOSINOPHILS: 2 % (ref 0–7)
HCT: 27.2 % — ABNORMAL LOW (ref 35.0–47.0)
HGB: 9.1 g/dL — ABNORMAL LOW (ref 11.5–16.0)
IMMATURE GRANULOCYTES: 1 % — ABNORMAL HIGH (ref 0.0–0.5)
LYMPHOCYTES: 12 % (ref 12–49)
MCH: 31.3 PG (ref 26.0–34.0)
MCHC: 33.5 g/dL (ref 30.0–36.5)
MCV: 93.5 FL (ref 80.0–99.0)
MONOCYTES: 11 % (ref 5–13)
MPV: 10.2 FL (ref 8.9–12.9)
NEUTROPHILS: 73 % (ref 32–75)
NRBC: 0 PER 100 WBC
PLATELET: 294 10*3/uL (ref 150–400)
RBC: 2.91 M/uL — ABNORMAL LOW (ref 3.80–5.20)
RDW: 12.6 % (ref 11.5–14.5)
WBC: 9.7 10*3/uL (ref 3.6–11.0)

## 2019-11-30 LAB — METABOLIC PANEL, BASIC
Anion gap: 9 mmol/L (ref 5–15)
BUN/Creatinine ratio: 14 (ref 12–20)
BUN: 11 MG/DL (ref 6–20)
CO2: 24 mmol/L (ref 21–32)
Calcium: 9.5 MG/DL (ref 8.5–10.1)
Chloride: 100 mmol/L (ref 97–108)
Creatinine: 0.81 MG/DL (ref 0.55–1.02)
GFR est AA: >60 mL/min/{1.73_m2} (ref >60)
GFR est non-AA: >60 mL/min/{1.73_m2} (ref >60)
Glucose: 120 mg/dL — ABNORMAL HIGH (ref 65–100)
Potassium: 3.3 mmol/L — ABNORMAL LOW (ref 3.5–5.1)
Sodium: 133 mmol/L — ABNORMAL LOW (ref 136–145)

## 2019-11-30 LAB — GLUCOSE, POC
Glucose (POC): 130 mg/dL — ABNORMAL HIGH (ref 65–100)
Glucose (POC): 118 mg/dL — ABNORMAL HIGH (ref 65–100)
Glucose (POC): 160 mg/dL — ABNORMAL HIGH (ref 65–100)
Glucose (POC): 135 mg/dL — ABNORMAL HIGH (ref 65–100)

## 2019-11-30 LAB — CBC WITH AUTO DIFFERENTIAL
Basophils %: 1 % (ref 0–1)
Basophils Absolute: 0.1 10*3/uL (ref 0.0–0.1)
Eosinophils %: 2 % (ref 0–7)
Eosinophils Absolute: 0.2 10*3/uL (ref 0.0–0.4)
Granulocyte Absolute Count: 0.1 10*3/uL — ABNORMAL HIGH (ref 0.00–0.04)
Hematocrit: 27.2 % — ABNORMAL LOW (ref 35.0–47.0)
Hemoglobin: 9.1 g/dL — ABNORMAL LOW (ref 11.5–16.0)
Immature Granulocytes %: 1 % — ABNORMAL HIGH (ref 0.0–0.5)
Lymphocytes %: 12 % (ref 12–49)
Lymphocytes Absolute: 1.1 10*3/uL (ref 0.8–3.5)
MCH: 31.3 PG (ref 26.0–34.0)
MCHC: 33.5 g/dL (ref 30.0–36.5)
MCV: 93.5 FL (ref 80.0–99.0)
MPV: 10.2 FL (ref 8.9–12.9)
Monocytes %: 11 % (ref 5–13)
Monocytes Absolute: 1.1 10*3/uL — ABNORMAL HIGH (ref 0.0–1.0)
NRBC Absolute: 0 10*3/uL (ref 0.00–0.01)
Neutrophils %: 73 % (ref 32–75)
Neutrophils Absolute: 7.2 10*3/uL (ref 1.8–8.0)
Nucleated RBCs: 0 PER 100 WBC
Platelets: 294 10*3/uL (ref 150–400)
RBC: 2.91 M/uL — ABNORMAL LOW (ref 3.80–5.20)
RDW: 12.6 % (ref 11.5–14.5)
WBC: 9.7 10*3/uL (ref 3.6–11.0)

## 2019-11-30 LAB — BASIC METABOLIC PANEL
Anion Gap: 9 mmol/L (ref 5–15)
BUN/Creatinine Ratio: 14 (ref 12–20)
BUN: 11 MG/DL (ref 6–20)
CO2: 24 mmol/L (ref 21–32)
Calcium: 9.5 MG/DL (ref 8.5–10.1)
Chloride: 100 mmol/L (ref 97–108)
Creatinine: 0.81 MG/DL (ref 0.55–1.02)
GFR African American: >60 mL/min/{1.73_m2} (ref >60)
Glucose: 120 mg/dL — ABNORMAL HIGH (ref 65–100)
Potassium: 3.3 mmol/L — ABNORMAL LOW (ref 3.5–5.1)
Sodium: 133 mmol/L — ABNORMAL LOW (ref 136–145)
eGFR NON-AA: >60 mL/min/{1.73_m2} (ref >60)

## 2019-11-30 LAB — POCT GLUCOSE
POC Glucose: 130 mg/dL — ABNORMAL HIGH (ref 65–100)
POC Glucose: 118 mg/dL — ABNORMAL HIGH (ref 65–100)
POC Glucose: 160 mg/dL — ABNORMAL HIGH (ref 65–100)
POC Glucose: 135 mg/dL — ABNORMAL HIGH (ref 65–100)

## 2019-11-30 MED ORDER — PHARMACY VANCOMYCIN NOTE
Freq: Once | Status: DC
Start: 2019-11-30 — End: 2019-12-01

## 2019-11-30 MED ORDER — POTASSIUM CHLORIDE SR 10 MEQ TAB
10 mEq | ORAL | Status: AC
Start: 2019-11-30 — End: 2019-11-30
  Administered 2019-11-30: 13:00:00 via ORAL

## 2019-11-30 MED FILL — DOK 100 MG CAPSULE: 100 mg | ORAL | Qty: 1

## 2019-11-30 MED FILL — INSULIN GLARGINE 100 UNIT/ML INJECTION: 100 unit/mL | SUBCUTANEOUS | Qty: 1

## 2019-11-30 MED FILL — OXYCODONE 5 MG TAB: 5 mg | ORAL | Qty: 1

## 2019-11-30 MED FILL — ENOXAPARIN 40 MG/0.4 ML SUB-Q SYRINGE: 40 mg/0.4 mL | SUBCUTANEOUS | Qty: 0.4

## 2019-11-30 MED FILL — MORPHINE 2 MG/ML INJECTION: 2 mg/mL | INTRAMUSCULAR | Qty: 1

## 2019-11-30 MED FILL — ASPIRIN 81 MG TAB, DELAYED RELEASE: 81 mg | ORAL | Qty: 1

## 2019-11-30 MED FILL — INSULIN LISPRO 100 UNIT/ML INJECTION: 100 unit/mL | SUBCUTANEOUS | Qty: 1

## 2019-11-30 MED FILL — VANCOMYCIN 1,000 MG IV SOLR: 1000 mg | INTRAVENOUS | Qty: 1000

## 2019-11-30 MED FILL — PHARMACY VANCOMYCIN NOTE: Qty: 1

## 2019-11-30 MED FILL — K-TAB 10 MEQ TABLET,EXTENDED RELEASE: 10 mEq | ORAL | Qty: 4

## 2019-11-30 MED FILL — SENNA LAX 8.6 MG TABLET: 8.6 mg | ORAL | Qty: 1

## 2019-11-30 MED FILL — PANTOPRAZOLE 40 MG TAB, DELAYED RELEASE: 40 mg | ORAL | Qty: 1

## 2019-11-30 MED FILL — GABAPENTIN 300 MG CAP: 300 mg | ORAL | Qty: 1

## 2019-11-30 NOTE — Progress Notes (Signed)
Lindsey Roch, NP said to hold patient's losartan, hydrochlorothiazide, and amlodipine with BP 95/61.

## 2019-11-30 NOTE — Progress Notes (Signed)
NP said okay to give patient's PRN oxycodone if SBP >100 and she would adjust orders.

## 2019-11-30 NOTE — Progress Notes (Signed)
Nurse Quin Hoop gave bedside report to oncoming nurse [name] RN by SBAR and Kardex

## 2019-11-30 NOTE — Progress Notes (Signed)
Bedside and Verbal shift change report given to Christy (oncoming nurse) by [name] (offgoing nurse). Report included the following information SBAR, Kardex, Procedure Summary, Intake/Output and MAR.

## 2019-11-30 NOTE — Progress Notes (Signed)
Maralyn Sago, NP said to hold patient's losartan, hydrochlorothiazide, and amlodipine with BP 95/61.

## 2019-11-30 NOTE — Progress Notes (Signed)
Progress Notes by Hollice Espy, NP at 11/30/19 [phone]                Author: Hollice Espy, NP  Service: Nurse Practitioner  Author Type: Acute Care Nurse Practitioner       Filed: 11/30/19 0917  Date of Service: 11/30/19 0908  Status: Signed           Editor: Hollice Espy, NP (Acute Care Nurse Practitioner)  Cosigner: Tora Kindred, MD at 11/30/19 [facility]                                                                                              Hospitalist Progress Note   Hollice Espy, NP   Answering service: [phone] OR [phone] from in house phone            Date of Service:  11/30/2019   NAME:  Lindsey Huynh   DOB:  1951-04-12   MRN:  [id number]           Admission Summary:        From H&P 11/23/2019:   "Lindsey Huynh??is a 69 y.o.??female??with past medical history of diabetes, insulin-dependent, GERD and hypertension comes for return of left foot pain and swelling.   Patient is awake, alert and oriented able to answer my question follow my request, data also obtained in the ED staff and extensive chart review. ??As per collective reports, patient??left  great toe pain which associated darkening of the skin on the distal end of the toe and swelling of the left foot the medial aspect of the Of the last 1 week and has been progressively getting worse. ??The only thing that he remembers  was fungal nail cream. ??She reported since her left second toe is in contact with the first toe and that pain he also became slightly cellulitic, she also has some dorsal swelling of the foot as well. ??This all progress from being mildly achy to  significant tender and swollen last 1 week and she came today because the pain was unbearable, 10/10, getting from her toe to her foot and made her ambulation significantly difficult. ??Although she continues to have pain at rest. ??She has not seek  medical help for that until today, did not  remember any trauma or injury to her feet. ??She is hemodynamically otherwise stable in the ED on her own. ??Patient was hemodynamically stable in the ED, and work revealed hypokalemia leukocytosis and x-ray  the foot was without any acute abnormality. ??Given concerns for dry gangrene and cellulitis, she was started on IV vancomycin. Patient has no other medical complaints. ??Patient will be admitted to hospitalist??service for evaluation management."      Interval history / Subjective:         F/U for left foot cellulitis, left great toe gangrene, DMII, HTN, GERD, Hypokalemia, left popliteal occlusion.   Patient seen and examined sitting up in bed. States that she is feeling good  today, pain controlled.    Discussed trial of PO pain medication to observe pain control.    S/p Fem-Pop bypass of left leg (03/11)- Four incisions noted with staples that are intact. Incisions appear to be healing without signs of infections.    Low grade temp of 100.1-  Gave Incentive spirometer, educated on usage. Demonstrated proper usage. Encourage patient to be out of bed for meals and to increase activity with assistance.           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   DMII:??hold metformin while IP   -Cont basal insulin, SSI   -Diabetic diet   -a1c 8.1   - DM following??   ??   HTN   -Controlled   -Cont home meds    ??   GERD   -Cont PPI   ??   Hypokalemia   -Resolved      Code status: Full    DVT prophylaxis: Lovenox       Care Plan discussed with: Patient/Family and Nurse   Anticipated Disposition: Home w/Family   Anticipated Discharge: 24 hours to 48 hours           Hospital Problems   Date Reviewed:  05/31/2018                         Codes  Class  Noted  POA              Gangrene ([place])  ICD-10-CM:  [id number]   ICD-9-CM: 785.4    11/23/2019  Unknown                               Review of Systems:     A comprehensive review of systems was negative except for that written in the HPI.            Vital Signs:      Last 24hrs VS reviewed since prior progress note. Most recent are:   Visit Vitals      BP  95/61 (BP 1 Location: Right upper arm, BP Patient Position: At rest)     Pulse  94     Temp  100.1 ??F (37.8 ??C)     Resp  14     Ht  5\' 5"  (1.651 m)     Wt  76.9 kg (169 lb 8 oz)     SpO2  98%        BMI  28.21 kg/m??              Intake/Output Summary (Last 24 hours) at 11/30/2019 0908   Last data filed at 11/29/2019 1445     Gross per 24 hour        Intake  --        Output  500 ml        Net  -500 ml              Physical Examination:  Constitutional:   No acute distress, cooperative, pleasant, answers questions      ENT:   Oral mucosa moist, oropharynx benign.      Resp:   CTA bilaterally on room air. No wheezing/rhonchi/rales. No accessory muscle use     CV:   Regular rhythm, normal rate, no murmurs, gallops, rubs      GI:   Soft, non distended, non tender. normoactive bowel sounds      Musculoskeletal:   No edema, warm, 2+ pulses throughout         Neurologic:   Moves all extremities.  AAOx3, CN II-XII reviewed, follows commands       Psych:  Good insight, Not anxious nor agitated.   Skin:  Good turgor, no rashes or ulcers and Four left leg surgical incisions noted, staples intact. No signs of infection noted.                Data Review:      Review and/or order of clinical lab test   Review and/or order of tests in the radiology section of CPT   Review and/or order of tests in the medicine section of CPT           Labs:          Recent Labs            11/30/19   0223  11/29/19   0454     WBC  9.7  10.1     HGB  9.1*  9.5*     HCT  27.2*  29.0*         PLT  294  336          Recent Labs             11/30/19   0223  11/29/19   0454  11/28/19   0217     NA  133*  138  139     K  3.3*  3.8  3.5     CL   100  107  105     CO2  24  24  25      BUN  11  10  15      CREA  0.81  0.77  0.97     GLU  120*  89  124*          CA  9.5  9.5  10.4*        No results for input(s): ALT, AP, TBIL, TBILI, TP, ALB, GLOB, GGT, AML, LPSE in the last 72 hours.      No lab exists for component: SGOT, GPT, AMYP, HLPSE   No results for input(s): INR, PTP, APTT, INREXT in the last 72 hours.    No results for input(s): FE, TIBC, PSAT, FERR in the last 72 hours.    No results found for: FOL, RBCF    No results for input(s): PH, PCO2, PO2 in the last 72 hours.   No results for input(s): CPK, CKNDX, TROIQ in the last 72 hours.      No lab exists for component: CPKMB     Lab Results         Component  Value  Date/Time            Cholesterol, total  219 (H)  10/30/2018 10:27 AM       HDL Cholesterol  63  10/30/2018 10:27 AM       LDL, calculated  130 (H)  10/30/2018  10:27 AM       Triglyceride  131  10/30/2018 10:27 AM            CHOL/HDL Ratio  3.4  12/26/2007 09:12 AM          Lab Results         Component  Value  Date/Time            Glucose (POC)  118 (H)  11/30/2019 05:59 AM       Glucose (POC)  130 (H)  11/29/2019 10:01 PM       Glucose (POC)  171 (H)  11/29/2019 04:13 PM       Glucose (POC)  134 (H)  11/29/2019 11:04 AM            Glucose (POC)  75  11/29/2019 06:56 AM        No results found for: COLOR, APPRN, SPGRU, REFSG, PHU, PROTU, GLUCU, KETU, BILU, UROU, NITU, LEUKU, GLUKE, EPSU, BACTU, WBCU, RBCU, CASTS, UCRY           Medications Reviewed:          Current Facility-Administered Medications          Medication  Dose  Route  Frequency           ?  [START ON 12/01/2019] Vancomycin, Trough - Please draw IMMEDIATELY PRIOR to starting 1000 dose on Sunday, 3/14. Thanks!     Other  ONCE     ?  morphine injection 2 mg   2 mg  IntraVENous  Q4H PRN     ?  sodium chloride (NS) flush 5-40 mL   5-40 mL  IntraVENous  Q8H     ?  sodium chloride (NS) flush 5-40 mL   5-40 mL  IntraVENous  PRN     ?  vancomycin (VANCOCIN) 1,000 mg in 0.9% sodium  chloride 250 mL (VIAL-MATE)   1,000 mg  IntraVENous  Q16H     ?  sodium phosphate (FLEET'S) enema 1 Enema   1 Enema  Rectal  PRN     ?  oxyCODONE IR (ROXICODONE) tablet 5 mg   5 mg  Oral  Q4H PRN     ?  insulin glargine (LANTUS) injection 16 Units   16 Units  SubCUTAneous  QHS     ?  Vancomycin- Pharmacy to Dose     Other  Rx Dosing/Monitoring     ?  gabapentin (NEURONTIN) capsule 300 mg   300 mg  Oral  QHS     ?  losartan (COZAAR) tablet 100 mg   100 mg  Oral  DAILY     ?  docusate sodium (COLACE) capsule 100 mg   100 mg  Oral  BID     ?  senna (SENOKOT) tablet 8.6 mg   1 Tab  Oral  DAILY     ?  polyethylene glycol (MIRALAX) packet 17 g   17 g  Oral  DAILY PRN     ?  bisacodyL (DULCOLAX) suppository 10 mg   10 mg  Rectal  DAILY PRN     ?  hydroCHLOROthiazide (HYDRODIURIL) tablet 25 mg   25 mg  Oral  DAILY     ?  sodium chloride (NS) flush 5-40 mL   5-40 mL  IntraVENous  Q8H     ?  sodium chloride (NS) flush 5-40 mL   5-40 mL  IntraVENous  PRN           ?  acetaminophen (TYLENOL) tablet 650 mg   650 mg  Oral  Q6H PRN          Or           ?  acetaminophen (TYLENOL) suppository 650 mg   650 mg  Rectal  Q6H PRN     ?  promethazine (PHENERGAN) tablet 12.5 mg   12.5 mg  Oral  Q6H PRN          Or           ?  ondansetron (ZOFRAN) injection 4 mg   4 mg  IntraVENous  Q6H PRN     ?  glucose chewable tablet 16 g   4 Tab  Oral  PRN     ?  dextrose (D50W) injection syrg 12.5-25 g   25-50 mL  IntraVENous  PRN     ?  glucagon (GLUCAGEN) injection 1 mg   1 mg  IntraMUSCular  PRN     ?  insulin lispro (HUMALOG) injection     SubCUTAneous  AC&HS     ?  aspirin delayed-release tablet 81 mg   81 mg  Oral  DAILY     ?  pantoprazole (PROTONIX) tablet 40 mg   40 mg  Oral  ACB     ?  amLODIPine (NORVASC) tablet 10 mg   10 mg  Oral  DAILY           ?  enoxaparin (LOVENOX) injection 40 mg   40 mg  SubCUTAneous  Q24H        ______________________________________________________________________   EXPECTED LENGTH OF STAY: 4d 2h   ACTUAL  LENGTH OF STAY:          [place], NP

## 2019-11-30 NOTE — Progress Notes (Signed)
Vascular:    Looks good, foot pain much better    Leg dressings intact. Palpable graft pulse    Appreciate podiatry consult    Continue current care from my standpoint

## 2019-11-30 NOTE — Progress Notes (Signed)
[facility]                                                                                          Hospitalist Progress Note  Lindsey Espy, NP  Answering service: [phone] OR [phone] from in house phone        Date of Service:  11/30/2019  NAME:  Lindsey Huynh  DOB:  12/14/1950  MRN:  [id number]      Admission Summary:   From H&P 11/23/2019:  "Lindsey Huynh??is a 69 y.o.??female??with past medical history of diabetes, insulin-dependent, GERD and hypertension comes for return of left foot pain and swelling.  Patient is awake, alert and oriented able to answer my question follow my request, data also obtained in the ED staff and extensive chart review. ??As per collective reports, patient??left great toe pain which associated darkening of the skin on the distal end of the toe and swelling of the left foot the medial aspect of the Of the last 1 week and has been progressively getting worse. ??The only thing that he remembers was fungal nail cream. ??She reported since her left second toe is in contact with the first toe and that pain he also became slightly cellulitic, she also has some dorsal swelling of the foot as well. ??This all progress from being mildly achy to significant tender and swollen last 1 week and she came today because the pain was unbearable, 10/10, getting from her toe to her foot and made her ambulation significantly difficult. ??Although she continues to have pain at rest. ??She has not seek medical help for that until today, did not remember any trauma or injury to her feet. ??She is hemodynamically otherwise stable in the ED on her own. ??Patient was hemodynamically stable in the ED, and work revealed hypokalemia leukocytosis and x-ray the foot was without any acute abnormality. ??Given concerns for dry gangrene and cellulitis, she was started on IV vancomycin. Patient has no other medical complaints. ??Patient will be admitted to hospitalist??service for  evaluation management."    Interval history / Subjective:    F/U for left foot cellulitis, left great toe gangrene, DMII, HTN, GERD, Hypokalemia, left popliteal occlusion.  Patient seen and examined sitting up in bed. States that she is feeling good today, pain controlled.   Discussed trial of PO pain medication to observe pain control.   S/p Fem-Pop bypass of left leg (03/11)- Four incisions noted with staples that are intact. Incisions appear to be healing without signs of infections.   Low grade temp of 100.1-  Gave Incentive spirometer, educated on usage. Demonstrated proper usage. Encourage patient to be out of bed for meals and to increase activity with assistance.      Assessment & Plan:     LLE pain:??dry gangrene??of??left 2nd toe cellulitic changes/Left popliteal occlusion  -s/p left fem-pop bypass (03/11)  -CT LLE (03/06) NL  -Elevated Sed rate and CRP  -BC (03/06) NGTD   -Pain control  - Podiatry following   - MRI completed  ??  DMII:??hold metformin while IP  -Cont basal insulin, SSI  -Diabetic diet  -  a1c 8.1  - DM following??  ??  HTN  -Controlled  -Cont home meds   ??  GERD  -Cont PPI  ??  Hypokalemia  -Resolved    Code status: Full   DVT prophylaxis: Lovenox     Care Plan discussed with: Patient/Family and Nurse  Anticipated Disposition: Home w/Family  Anticipated Discharge: 24 hours to 48 hours     Hospital Problems  Date Reviewed: 06-01-18          Codes Class Noted POA    Gangrene ([place]) ICD-10-CM: [id number]  ICD-9-CM: 785.4  11/23/2019 Unknown                Review of Systems:   A comprehensive review of systems was negative except for that written in the HPI.       Vital Signs:    Last 24hrs VS reviewed since prior progress note. Most recent are:  Visit Vitals  BP 95/61 (BP 1 Location: Right upper arm, BP Patient Position: At rest)   Pulse 94   Temp 100.1 ??F (37.8 ??C)   Resp 14   Ht 5\' 5"  (1.651 m)   Wt 76.9 kg (169 lb 8 oz)   SpO2 98%   BMI 28.21 kg/m??         Intake/Output Summary (Last 24 hours) at 11/30/2019  0908  Last data filed at 11/29/2019 1445  Gross per 24 hour   Intake ???   Output 500 ml   Net -500 ml        Physical Examination:             Constitutional:  No acute distress, cooperative, pleasant, answers questions    ENT:  Oral mucosa moist, oropharynx benign.    Resp:  CTA bilaterally on room air. No wheezing/rhonchi/rales. No accessory muscle use   CV:  Regular rhythm, normal rate, no murmurs, gallops, rubs    GI:  Soft, non distended, non tender. normoactive bowel sounds    Musculoskeletal:  No edema, warm, 2+ pulses throughout    Neurologic:  Moves all extremities.  AAOx3, CN II-XII reviewed, follows commands     Psych:  Good insight, Not anxious nor agitated.  Skin:  Good turgor, no rashes or ulcers and Four left leg surgical incisions noted, staples intact. No signs of infection noted.        Data Review:    Review and/or order of clinical lab test  Review and/or order of tests in the radiology section of CPT  Review and/or order of tests in the medicine section of CPT      Labs:     Recent Labs     11/30/19  0223 11/29/19  0454   WBC 9.7 10.1   HGB 9.1* 9.5*   HCT 27.2* 29.0*   PLT 294 336     Recent Labs     11/30/19  0223 11/29/19  0454 11/28/19  0217   NA 133* 138 139   K 3.3* 3.8 3.5   CL 100 107 105   CO2 24 24 25    BUN 11 10 15    CREA 0.81 0.77 0.97   GLU 120* 89 124*   CA 9.5 9.5 10.4*     No results for input(s): ALT, AP, TBIL, TBILI, TP, ALB, GLOB, GGT, AML, LPSE in the last 72 hours.    No lab exists for component: SGOT, GPT, AMYP, HLPSE  No results for input(s): INR, PTP, APTT, INREXT in the last 72 hours.  No results for input(s): FE, TIBC, PSAT, FERR in the last 72 hours.   No results found for: FOL, RBCF   No results for input(s): PH, PCO2, PO2 in the last 72 hours.  No results for input(s): CPK, CKNDX, TROIQ in the last 72 hours.    No lab exists for component: CPKMB  Lab Results   Component Value Date/Time    Cholesterol, total 219 (H) 10/30/2018 10:27 AM    HDL Cholesterol 63 10/30/2018  10:27 AM    LDL, calculated 130 (H) 10/30/2018 10:27 AM    Triglyceride 131 10/30/2018 10:27 AM    CHOL/HDL Ratio 3.4 12/26/2007 09:12 AM     Lab Results   Component Value Date/Time    Glucose (POC) 118 (H) 11/30/2019 05:59 AM    Glucose (POC) 130 (H) 11/29/2019 10:01 PM    Glucose (POC) 171 (H) 11/29/2019 04:13 PM    Glucose (POC) 134 (H) 11/29/2019 11:04 AM    Glucose (POC) 75 11/29/2019 06:56 AM     No results found for: COLOR, APPRN, SPGRU, REFSG, PHU, PROTU, GLUCU, KETU, BILU, UROU, NITU, LEUKU, GLUKE, EPSU, BACTU, WBCU, RBCU, CASTS, UCRY      Medications Reviewed:     Current Facility-Administered Medications   Medication Dose Route Frequency   ??? [START ON 12/01/2019] Vancomycin, Trough - Please draw IMMEDIATELY PRIOR to starting 1000 dose on Sunday, 3/14. Thanks!   Other ONCE   ??? morphine injection 2 mg  2 mg IntraVENous Q4H PRN   ??? sodium chloride (NS) flush 5-40 mL  5-40 mL IntraVENous Q8H   ??? sodium chloride (NS) flush 5-40 mL  5-40 mL IntraVENous PRN   ??? vancomycin (VANCOCIN) 1,000 mg in 0.9% sodium chloride 250 mL (VIAL-MATE)  1,000 mg IntraVENous Q16H   ??? sodium phosphate (FLEET'S) enema 1 Enema  1 Enema Rectal PRN   ??? oxyCODONE IR (ROXICODONE) tablet 5 mg  5 mg Oral Q4H PRN   ??? insulin glargine (LANTUS) injection 16 Units  16 Units SubCUTAneous QHS   ??? Vancomycin- Pharmacy to Dose   Other Rx Dosing/Monitoring   ??? gabapentin (NEURONTIN) capsule 300 mg  300 mg Oral QHS   ??? losartan (COZAAR) tablet 100 mg  100 mg Oral DAILY   ??? docusate sodium (COLACE) capsule 100 mg  100 mg Oral BID   ??? senna (SENOKOT) tablet 8.6 mg  1 Tab Oral DAILY   ??? polyethylene glycol (MIRALAX) packet 17 g  17 g Oral DAILY PRN   ??? bisacodyL (DULCOLAX) suppository 10 mg  10 mg Rectal DAILY PRN   ??? hydroCHLOROthiazide (HYDRODIURIL) tablet 25 mg  25 mg Oral DAILY   ??? sodium chloride (NS) flush 5-40 mL  5-40 mL IntraVENous Q8H   ??? sodium chloride (NS) flush 5-40 mL  5-40 mL IntraVENous PRN   ??? acetaminophen (TYLENOL) tablet 650 mg  650  mg Oral Q6H PRN    Or   ??? acetaminophen (TYLENOL) suppository 650 mg  650 mg Rectal Q6H PRN   ??? promethazine (PHENERGAN) tablet 12.5 mg  12.5 mg Oral Q6H PRN    Or   ??? ondansetron (ZOFRAN) injection 4 mg  4 mg IntraVENous Q6H PRN   ??? glucose chewable tablet 16 g  4 Tab Oral PRN   ??? dextrose (D50W) injection syrg 12.5-25 g  25-50 mL IntraVENous PRN   ??? glucagon (GLUCAGEN) injection 1 mg  1 mg IntraMUSCular PRN   ??? insulin lispro (HUMALOG) injection   SubCUTAneous AC&HS   ???  aspirin delayed-release tablet 81 mg  81 mg Oral DAILY   ??? pantoprazole (PROTONIX) tablet 40 mg  40 mg Oral ACB   ??? amLODIPine (NORVASC) tablet 10 mg  10 mg Oral DAILY   ??? enoxaparin (LOVENOX) injection 40 mg  40 mg SubCUTAneous Q24H     ______________________________________________________________________  EXPECTED LENGTH OF STAY: 4d 2h  ACTUAL LENGTH OF STAY:          [place], NP

## 2019-12-01 LAB — GLUCOSE, POC
Glucose (POC): 131 mg/dL — ABNORMAL HIGH (ref 65–100)
Glucose (POC): 147 mg/dL — ABNORMAL HIGH (ref 65–100)
Glucose (POC): 173 mg/dL — ABNORMAL HIGH (ref 65–100)
Glucose (POC): 170 mg/dL — ABNORMAL HIGH (ref 65–100)

## 2019-12-01 LAB — CBC WITH AUTOMATED DIFF
ABS. BASOPHILS: 0 10*3/uL (ref 0.0–0.1)
ABS. EOSINOPHILS: 0.4 10*3/uL (ref 0.0–0.4)
ABS. IMM. GRANS.: 0 10*3/uL (ref 0.00–0.04)
ABS. LYMPHOCYTES: 0.9 10*3/uL (ref 0.8–3.5)
ABS. MONOCYTES: 0.8 10*3/uL (ref 0.0–1.0)
ABS. NEUTROPHILS: 6.2 10*3/uL (ref 1.8–8.0)
ABSOLUTE NRBC: 0 10*3/uL (ref 0.00–0.01)
BASOPHILS: 0 % (ref 0–1)
EOSINOPHILS: 5 % (ref 0–7)
HCT: 24.6 % — ABNORMAL LOW (ref 35.0–47.0)
HGB: 8.4 g/dL — ABNORMAL LOW (ref 11.5–16.0)
IMMATURE GRANULOCYTES: 1 % — ABNORMAL HIGH (ref 0.0–0.5)
LYMPHOCYTES: 11 % — ABNORMAL LOW (ref 12–49)
MCH: 31.1 PG (ref 26.0–34.0)
MCHC: 34.1 g/dL (ref 30.0–36.5)
MCV: 91.1 FL (ref 80.0–99.0)
MONOCYTES: 10 % (ref 5–13)
MPV: 10.4 FL (ref 8.9–12.9)
NEUTROPHILS: 73 % (ref 32–75)
NRBC: 0 PER 100 WBC
PLATELET: 306 10*3/uL (ref 150–400)
RBC: 2.7 M/uL — ABNORMAL LOW (ref 3.80–5.20)
RDW: 13.1 % (ref 11.5–14.5)
WBC: 8.4 10*3/uL (ref 3.6–11.0)

## 2019-12-01 LAB — METABOLIC PANEL, BASIC
Anion gap: 10 mmol/L (ref 5–15)
BUN/Creatinine ratio: 17 (ref 12–20)
BUN: 16 MG/DL (ref 6–20)
CO2: 24 mmol/L (ref 21–32)
Calcium: 10.1 MG/DL (ref 8.5–10.1)
Chloride: 101 mmol/L (ref 97–108)
Creatinine: 0.93 MG/DL (ref 0.55–1.02)
GFR est AA: >60 mL/min/{1.73_m2} (ref >60)
GFR est non-AA: 60 mL/min/{1.73_m2} — ABNORMAL LOW (ref >60)
Glucose: 126 mg/dL — ABNORMAL HIGH (ref 65–100)
Potassium: 3.2 mmol/L — ABNORMAL LOW (ref 3.5–5.1)
Sodium: 135 mmol/L — ABNORMAL LOW (ref 136–145)

## 2019-12-01 LAB — CBC WITH AUTO DIFFERENTIAL
Basophils %: 0 % (ref 0–1)
Basophils Absolute: 0 10*3/uL (ref 0.0–0.1)
Eosinophils %: 5 % (ref 0–7)
Eosinophils Absolute: 0.4 10*3/uL (ref 0.0–0.4)
Granulocyte Absolute Count: 0 10*3/uL (ref 0.00–0.04)
Hematocrit: 24.6 % — ABNORMAL LOW (ref 35.0–47.0)
Hemoglobin: 8.4 g/dL — ABNORMAL LOW (ref 11.5–16.0)
Immature Granulocytes %: 1 % — ABNORMAL HIGH (ref 0.0–0.5)
Lymphocytes %: 11 % — ABNORMAL LOW (ref 12–49)
Lymphocytes Absolute: 0.9 10*3/uL (ref 0.8–3.5)
MCH: 31.1 PG (ref 26.0–34.0)
MCHC: 34.1 g/dL (ref 30.0–36.5)
MCV: 91.1 FL (ref 80.0–99.0)
MPV: 10.4 FL (ref 8.9–12.9)
Monocytes %: 10 % (ref 5–13)
Monocytes Absolute: 0.8 10*3/uL (ref 0.0–1.0)
NRBC Absolute: 0 10*3/uL (ref 0.00–0.01)
Neutrophils %: 73 % (ref 32–75)
Neutrophils Absolute: 6.2 10*3/uL (ref 1.8–8.0)
Nucleated RBCs: 0 PER 100 WBC
Platelets: 306 10*3/uL (ref 150–400)
RBC: 2.7 M/uL — ABNORMAL LOW (ref 3.80–5.20)
RDW: 13.1 % (ref 11.5–14.5)
WBC: 8.4 10*3/uL (ref 3.6–11.0)

## 2019-12-01 LAB — BASIC METABOLIC PANEL
Anion Gap: 10 mmol/L (ref 5–15)
BUN/Creatinine Ratio: 17 (ref 12–20)
BUN: 16 MG/DL (ref 6–20)
CO2: 24 mmol/L (ref 21–32)
Calcium: 10.1 MG/DL (ref 8.5–10.1)
Chloride: 101 mmol/L (ref 97–108)
Creatinine: 0.93 MG/DL (ref 0.55–1.02)
GFR African American: >60 mL/min/{1.73_m2} (ref >60)
Glucose: 126 mg/dL — ABNORMAL HIGH (ref 65–100)
Potassium: 3.2 mmol/L — ABNORMAL LOW (ref 3.5–5.1)
Sodium: 135 mmol/L — ABNORMAL LOW (ref 136–145)
eGFR NON-AA: 60 mL/min/{1.73_m2} — ABNORMAL LOW (ref >60)

## 2019-12-01 LAB — POCT GLUCOSE
POC Glucose: 131 mg/dL — ABNORMAL HIGH (ref 65–100)
POC Glucose: 147 mg/dL — ABNORMAL HIGH (ref 65–100)
POC Glucose: 173 mg/dL — ABNORMAL HIGH (ref 65–100)
POC Glucose: 170 mg/dL — ABNORMAL HIGH (ref 65–100)

## 2019-12-01 MED ORDER — POLYETHYLENE GLYCOL 3350 17 GRAM (100 %) ORAL POWDER PACKET
17 gram | Freq: Once | ORAL | Status: AC
Start: 2019-12-01 — End: 2019-12-01
  Administered 2019-12-01: 14:00:00 via ORAL

## 2019-12-01 MED ORDER — POTASSIUM CHLORIDE SR 10 MEQ TAB
10 mEq | ORAL | Status: AC
Start: 2019-12-01 — End: 2019-12-01
  Administered 2019-12-01: 14:00:00 via ORAL

## 2019-12-01 MED FILL — OXYCODONE 5 MG TAB: 5 mg | ORAL | Qty: 1

## 2019-12-01 MED FILL — INSULIN LISPRO 100 UNIT/ML INJECTION: 100 unit/mL | SUBCUTANEOUS | Qty: 1

## 2019-12-01 MED FILL — GABAPENTIN 300 MG CAP: 300 mg | ORAL | Qty: 1

## 2019-12-01 MED FILL — INSULIN GLARGINE 100 UNIT/ML INJECTION: 100 unit/mL | SUBCUTANEOUS | Qty: 1

## 2019-12-01 MED FILL — K-TAB 10 MEQ TABLET,EXTENDED RELEASE: 10 mEq | ORAL | Qty: 4

## 2019-12-01 MED FILL — ACETAMINOPHEN 325 MG TABLET: 325 mg | ORAL | Qty: 2

## 2019-12-01 MED FILL — SENNA LAX 8.6 MG TABLET: 8.6 mg | ORAL | Qty: 1

## 2019-12-01 MED FILL — ENOXAPARIN 40 MG/0.4 ML SUB-Q SYRINGE: 40 mg/0.4 mL | SUBCUTANEOUS | Qty: 0.4

## 2019-12-01 MED FILL — ASPIRIN 81 MG TAB, DELAYED RELEASE: 81 mg | ORAL | Qty: 1

## 2019-12-01 MED FILL — POLYETHYLENE GLYCOL 3350 17 GRAM (100 %) ORAL POWDER PACKET: 17 gram | ORAL | Qty: 1

## 2019-12-01 MED FILL — DOK 100 MG CAPSULE: 100 mg | ORAL | Qty: 1

## 2019-12-01 MED FILL — PANTOPRAZOLE 40 MG TAB, DELAYED RELEASE: 40 mg | ORAL | Qty: 1

## 2019-12-01 NOTE — Progress Notes (Signed)
[name], NP said to hold patient's losartan, hydrochlorothiazide, and amlodipine for today with BP 113/64.

## 2019-12-01 NOTE — Progress Notes (Signed)
[facility]                                                                                          Hospitalist Progress Note  Hollice Espy, NP  Answering service: [phone] OR [phone] from in house phone        Date of Service:  12/01/2019  NAME:  Lindsey Huynh  DOB:  [birth year]  MRN:  [id number]      Admission Summary:   From H&P 11/23/2019:  "Lindsey Huynh??is a 69 y.o.??female??with past medical history of diabetes, insulin-dependent, GERD and hypertension comes for return of left foot pain and swelling.  Patient is awake, alert and oriented able to answer my question follow my request, data also obtained in the ED staff and extensive chart review. ??As per collective reports, patient??left great toe pain which associated darkening of the skin on the distal end of the toe and swelling of the left foot the medial aspect of the Of the last 1 week and has been progressively getting worse. ??The only thing that he remembers was fungal nail cream. ??She reported since her left second toe is in contact with the first toe and that pain he also became slightly cellulitic, she also has some dorsal swelling of the foot as well. ??This all progress from being mildly achy to significant tender and swollen last 1 week and she came today because the pain was unbearable, 10/10, getting from her toe to her foot and made her ambulation significantly difficult. ??Although she continues to have pain at rest. ??She has not seek medical help for that until today, did not remember any trauma or injury to her feet. ??She is hemodynamically otherwise stable in the ED on her own. ??Patient was hemodynamically stable in the ED, and work revealed hypokalemia leukocytosis and x-ray the foot was without any acute abnormality. ??Given concerns for dry gangrene and cellulitis, she was started on IV vancomycin. Patient has no other medical complaints. ??Patient will be admitted to hospitalist??service for  evaluation management."    Interval history / Subjective:    F/U for left foot cellulitis, left great toe gangrene, DMII, HTN, GERD, Hypokalemia, left popliteal occlusion  Patient seen and examined, sitting up in bed. States that she is doing ok this morning.   Feels that pain has been controlled. Has only had incision tenderness and some burning in toes. Discussed alternating acetaminophen and narcotics. She is in agreement.   S/p fem-pop bypass of left leg (03/11). Four incisions noted with intact staples to left groin and leg.   Incisions appear to be healing well without signs of infection.   Encouraged continued use of IS and increase activity.   Discussed with RN- Patient to trial acetaminophen first and narcotics for breakthrough pain. Patient to be out of bed to chair with assistance for meals. She has been able to get to bedside commode.  No new complaints noted. No over night events noted.      Assessment & Plan:     LLE pain:??dry gangrene??of??left 2nd toe cellulitic changes/Left popliteal occlusion  -s/p left fem-pop bypass (03/11)  -  CT LLE (03/06) NL  -Elevated Sed rate and CRP  -BC (03/06) NGTD   -Pain control  - Podiatry following   - MRI completed  ??  DMII:??hold metformin while IP  -Cont basal insulin, SSI  -Diabetic diet  -a1c 8.1  - DM following??  ??  HTN  -Controlled  -Cont home meds   ??  GERD  -Cont PPI  ??  Hypokalemia  -potassium 3.2  - Replaced   - Monitor and replace as needed.     Code status: Full   DVT prophylaxis: Lovenox    Care Plan discussed with: Patient/Family and Nurse  Anticipated Disposition: Home w/Family  Anticipated Discharge: 24 hours to 48 hours     Hospital Problems  Date Reviewed: 06/10/2018          Codes Class Noted POA    Gangrene ([place]) ICD-10-CM: [id number]  ICD-9-CM: 785.4  11/23/2019 Unknown                Review of Systems:   A comprehensive review of systems was negative except for that written in the HPI.       Vital Signs:    Last 24hrs VS reviewed since prior progress note.  Most recent are:  Visit Vitals  BP 123/78 (BP 1 Location: Right upper arm, BP Patient Position: At rest)   Pulse 89   Temp 98 ??F (36.7 ??C)   Resp 18   Ht 5\' 5"  (1.651 m)   Wt 76.9 kg (169 lb 8 oz)   SpO2 98%   BMI 28.21 kg/m??         Intake/Output Summary (Last 24 hours) at 12/01/2019 0818  Last data filed at 12/01/2019 0615  Gross per 24 hour   Intake ???   Output 350 ml   Net -350 ml        Physical Examination:             Constitutional:  No acute distress, cooperative, pleasant, answers questions   ENT:  Oral mucosa moist, oropharynx benign.    Resp:  CTA bilaterally. No wheezing/rhonchi/rales. No accessory muscle use   CV:  Regular rhythm, normal rate, no murmurs, gallops, rubs    GI:  Soft, non distended, non tender. normoactive bowel sounds, no hepatosplenomegaly     Musculoskeletal:  No edema, warm, 2+ pulses throughout    Neurologic:  Moves all extremities.  AAOx3, CN II-XII reviewed, follows commands     Psych:  Good insight, Not anxious nor agitated.  Skin:  Good turgor, no rashes or ulcers and Four left leg surgical incisions noted, staples intact. No signs of infection noted.        Data Review:    Review and/or order of clinical lab test  Review and/or order of tests in the medicine section of CPT      Labs:     Recent Labs     12/01/19  0554 11/30/19  0223   WBC 8.4 9.7   HGB 8.4* 9.1*   HCT 24.6* 27.2*   PLT 306 294     Recent Labs     12/01/19  0554 11/30/19  0223 11/29/19  0454   NA 135* 133* 138   K 3.2* 3.3* 3.8   CL 101 100 107   CO2 24 24 24    BUN 16 11 10    CREA 0.93 0.81 0.77   GLU 126* 120* 89   CA 10.1 9.5 9.5     No results for  input(s): ALT, AP, TBIL, TBILI, TP, ALB, GLOB, GGT, AML, LPSE in the last 72 hours.    No lab exists for component: SGOT, GPT, AMYP, HLPSE  No results for input(s): INR, PTP, APTT, INREXT in the last 72 hours.   No results for input(s): FE, TIBC, PSAT, FERR in the last 72 hours.   No results found for: FOL, RBCF   No results for input(s): PH, PCO2, PO2 in the last 72  hours.  No results for input(s): CPK, CKNDX, TROIQ in the last 72 hours.    No lab exists for component: CPKMB  Lab Results   Component Value Date/Time    Cholesterol, total 219 (H) 10/30/2018 10:27 AM    HDL Cholesterol 63 10/30/2018 10:27 AM    LDL, calculated 130 (H) 10/30/2018 10:27 AM    Triglyceride 131 10/30/2018 10:27 AM    CHOL/HDL Ratio 3.4 12/26/2007 09:12 AM     Lab Results   Component Value Date/Time    Glucose (POC) 147 (H) 12/01/2019 06:14 AM    Glucose (POC) 131 (H) 11/30/2019 08:38 PM    Glucose (POC) 135 (H) 11/30/2019 04:31 PM    Glucose (POC) 160 (H) 11/30/2019 11:41 AM    Glucose (POC) 118 (H) 11/30/2019 05:59 AM     No results found for: COLOR, APPRN, SPGRU, REFSG, PHU, PROTU, GLUCU, KETU, BILU, UROU, NITU, LEUKU, GLUKE, EPSU, BACTU, WBCU, RBCU, CASTS, UCRY      Medications Reviewed:     Current Facility-Administered Medications   Medication Dose Route Frequency   ??? potassium chloride SR (KLOR-CON 10) tablet 40 mEq  40 mEq Oral NOW   ??? polyethylene glycol (MIRALAX) packet 17 g  17 g Oral ONCE   ??? Vancomycin, Trough - Please draw IMMEDIATELY PRIOR to starting 1000 dose on Sunday, 3/14. Thanks!   Other ONCE   ??? sodium chloride (NS) flush 5-40 mL  5-40 mL IntraVENous Q8H   ??? sodium chloride (NS) flush 5-40 mL  5-40 mL IntraVENous PRN   ??? vancomycin (VANCOCIN) 1,000 mg in 0.9% sodium chloride 250 mL (VIAL-MATE)  1,000 mg IntraVENous Q16H   ??? sodium phosphate (FLEET'S) enema 1 Enema  1 Enema Rectal PRN   ??? oxyCODONE IR (ROXICODONE) tablet 5 mg  5 mg Oral Q4H PRN   ??? insulin glargine (LANTUS) injection 16 Units  16 Units SubCUTAneous QHS   ??? Vancomycin- Pharmacy to Dose   Other Rx Dosing/Monitoring   ??? gabapentin (NEURONTIN) capsule 300 mg  300 mg Oral QHS   ??? losartan (COZAAR) tablet 100 mg  100 mg Oral DAILY   ??? docusate sodium (COLACE) capsule 100 mg  100 mg Oral BID   ??? senna (SENOKOT) tablet 8.6 mg  1 Tab Oral DAILY   ??? polyethylene glycol (MIRALAX) packet 17 g  17 g Oral DAILY PRN   ??? bisacodyL  (DULCOLAX) suppository 10 mg  10 mg Rectal DAILY PRN   ??? hydroCHLOROthiazide (HYDRODIURIL) tablet 25 mg  25 mg Oral DAILY   ??? sodium chloride (NS) flush 5-40 mL  5-40 mL IntraVENous Q8H   ??? sodium chloride (NS) flush 5-40 mL  5-40 mL IntraVENous PRN   ??? acetaminophen (TYLENOL) tablet 650 mg  650 mg Oral Q6H PRN    Or   ??? acetaminophen (TYLENOL) suppository 650 mg  650 mg Rectal Q6H PRN   ??? promethazine (PHENERGAN) tablet 12.5 mg  12.5 mg Oral Q6H PRN    Or   ??? ondansetron (ZOFRAN) injection 4 mg  4 mg IntraVENous Q6H PRN   ??? glucose chewable tablet 16 g  4 Tab Oral PRN   ??? dextrose (D50W) injection syrg 12.5-25 g  25-50 mL IntraVENous PRN   ??? glucagon (GLUCAGEN) injection 1 mg  1 mg IntraMUSCular PRN   ??? insulin lispro (HUMALOG) injection   SubCUTAneous AC&HS   ??? aspirin delayed-release tablet 81 mg  81 mg Oral DAILY   ??? pantoprazole (PROTONIX) tablet 40 mg  40 mg Oral ACB   ??? amLODIPine (NORVASC) tablet 10 mg  10 mg Oral DAILY   ??? enoxaparin (LOVENOX) injection 40 mg  40 mg SubCUTAneous Q24H     ______________________________________________________________________  EXPECTED LENGTH OF STAY: 4d 2h  ACTUAL LENGTH OF STAY:          [place], NP

## 2019-12-01 NOTE — Progress Notes (Signed)
Nurse Quin Hoop gave bedside report to oncoming nurse [name] RN by SBAR and Kardex

## 2019-12-01 NOTE — Progress Notes (Signed)
Progress Notes by Hollice Espy, NP at 12/01/19 [phone]                Author: Hollice Espy, NP  Service: Nurse Practitioner  Author Type: Acute Care Nurse Practitioner       Filed: 12/01/19 0842  Date of Service: 12/01/19 0817  Status: Signed           Editor: Hollice Espy, NP (Acute Care Nurse Practitioner)  Cosigner: Tora Kindred, MD at 12/01/19 [facility]                                                                                              Hospitalist Progress Note   Hollice Espy, NP   Answering service: [phone] OR [phone] from in house phone            Date of Service:  12/01/2019   NAME:  Lindsey Huynh   DOB:  [birth year]   MRN:  [id number]           Admission Summary:        From H&P 11/23/2019:   "Lindsey Huynh??is a 69 y.o.??female??with past medical history of diabetes, insulin-dependent, GERD and hypertension comes for return of left foot pain and swelling.   Patient is awake, alert and oriented able to answer my question follow my request, data also obtained in the ED staff and extensive chart review. ??As per collective reports, patient??left  great toe pain which associated darkening of the skin on the distal end of the toe and swelling of the left foot the medial aspect of the Of the last 1 week and has been progressively getting worse. ??The only thing that he remembers  was fungal nail cream. ??She reported since her left second toe is in contact with the first toe and that pain he also became slightly cellulitic, she also has some dorsal swelling of the foot as well. ??This all progress from being mildly achy to  significant tender and swollen last 1 week and she came today because the pain was unbearable, 10/10, getting from her toe to her foot and made her ambulation significantly difficult. ??Although she continues to have pain at rest. ??She has not seek  medical help for that until today, did not  remember any trauma or injury to her feet. ??She is hemodynamically otherwise stable in the ED on her own. ??Patient was hemodynamically stable in the ED, and work revealed hypokalemia leukocytosis and x-ray  the foot was without any acute abnormality. ??Given concerns for dry gangrene and cellulitis, she was started on IV vancomycin. Patient has no other medical complaints. ??Patient will be admitted to hospitalist??service for evaluation management."      Interval history / Subjective:         F/U for left foot cellulitis, left great toe gangrene, DMII, HTN, GERD, Hypokalemia, left popliteal occlusion   Patient seen and examined, sitting up in bed. States that she is doing ok  this morning.    Feels that pain has been controlled. Has only had incision tenderness and some burning in toes. Discussed alternating acetaminophen and narcotics. She is in agreement.    S/p fem-pop bypass of left leg (03/11). Four incisions noted with intact staples to left groin and leg.    Incisions appear to be healing well without signs of infection.    Encouraged continued use of IS and increase activity.    Discussed with RN- Patient to trial acetaminophen first and narcotics for breakthrough pain. Patient to be out of bed to chair with assistance for meals. She has been able to get to bedside commode.   No new complaints noted. No over night events noted.           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   DMII:??hold metformin while IP   -Cont basal insulin, SSI   -Diabetic diet   -a1c 8.1   - DM following??   ??   HTN   -Controlled   -Cont home meds    ??   GERD   -Cont PPI   ??   Hypokalemia   -potassium 3.2   - Replaced    - Monitor and replace as needed.       Code status: Full    DVT prophylaxis:  Lovenox      Care Plan discussed with: Patient/Family and Nurse   Anticipated Disposition: Home w/Family   Anticipated Discharge: 24 hours to 48 hours           Hospital Problems   Date Reviewed:  05/31/2018                         Codes  Class  Noted  POA              Gangrene ([place])  ICD-10-CM: [id number]   ICD-9-CM: 785.4    11/23/2019  Unknown                               Review of Systems:     A comprehensive review of systems was negative except for that written in the HPI.            Vital Signs:      Last 24hrs VS reviewed since prior progress note. Most recent are:   Visit Vitals      BP  123/78 (BP 1 Location: Right upper arm, BP Patient Position: At rest)     Pulse  89     Temp  98 ??F (36.7 ??C)     Resp  18     Ht  5\' 5"  (1.651 m)     Wt  76.9 kg (169 lb 8 oz)     SpO2  98%        BMI  28.21 kg/m??              Intake/Output Summary (Last 24 hours) at 12/01/2019 0818   Last data filed at 12/01/2019 0615     Gross per 24 hour  Intake  --        Output  350 ml        Net  -350 ml              Physical Examination:                     Constitutional:   No acute distress, cooperative, pleasant, answers questions     ENT:   Oral mucosa moist, oropharynx benign.      Resp:   CTA bilaterally. No wheezing/rhonchi/rales. No accessory muscle use     CV:   Regular rhythm, normal rate, no murmurs, gallops, rubs      GI:   Soft, non distended, non tender. normoactive bowel sounds, no hepatosplenomegaly       Musculoskeletal:   No edema, warm, 2+ pulses throughout         Neurologic:   Moves all extremities.  AAOx3, CN II-XII reviewed, follows commands       Psych:  Good insight, Not anxious nor agitated.   Skin:  Good turgor, no rashes or ulcers and Four left leg surgical incisions noted, staples intact. No signs of infection noted.                Data Review:      Review and/or order of clinical lab test   Review and/or order of tests in the medicine section of CPT           Labs:          Recent Labs            12/01/19    0554  11/30/19   0223     WBC  8.4  9.7     HGB  8.4*  9.1*     HCT  24.6*  27.2*         PLT  306  294          Recent Labs             12/01/19   0554  11/30/19   0223  11/29/19   0454     NA  135*  133*  138     K  3.2*  3.3*  3.8     CL  101  100  107     CO2  24  24  24      BUN  16  11  10      CREA  0.93  0.81  0.77     GLU  126*  120*  89          CA  10.1  9.5  9.5        No results for input(s): ALT, AP, TBIL, TBILI, TP, ALB, GLOB, GGT, AML, LPSE in the last 72 hours.      No lab exists for component: SGOT, GPT, AMYP, HLPSE   No results for input(s): INR, PTP, APTT, INREXT in the last 72 hours.    No results for input(s): FE, TIBC, PSAT, FERR in the last 72 hours.    No results found for: FOL, RBCF    No results for input(s): PH, PCO2, PO2 in the last 72 hours.   No results for input(s): CPK, CKNDX, TROIQ in the last 72 hours.      No lab exists for component: CPKMB     Lab Results         Component  Value  Date/Time  Cholesterol, total  219 (H)  10/30/2018 10:27 AM       HDL Cholesterol  63  10/30/2018 10:27 AM       LDL, calculated  130 (H)  10/30/2018 10:27 AM       Triglyceride  131  10/30/2018 10:27 AM            CHOL/HDL Ratio  3.4  12/26/2007 09:12 AM          Lab Results         Component  Value  Date/Time            Glucose (POC)  147 (H)  12/01/2019 06:14 AM       Glucose (POC)  131 (H)  11/30/2019 08:38 PM       Glucose (POC)  135 (H)  11/30/2019 04:31 PM       Glucose (POC)  160 (H)  11/30/2019 11:41 AM            Glucose (POC)  118 (H)  11/30/2019 05:59 AM        No results found for: COLOR, APPRN, SPGRU, REFSG, PHU, PROTU, GLUCU, KETU, BILU, UROU, NITU, LEUKU, GLUKE, EPSU, BACTU, WBCU, RBCU, CASTS, UCRY           Medications Reviewed:          Current Facility-Administered Medications          Medication  Dose  Route  Frequency           ?  potassium chloride SR (KLOR-CON 10) tablet 40 mEq   40 mEq  Oral  NOW     ?  polyethylene glycol (MIRALAX) packet 17 g   17 g  Oral  ONCE     ?   Vancomycin, Trough - Please draw IMMEDIATELY PRIOR to starting 1000 dose on Sunday, 3/14. Thanks!     Other  ONCE     ?  sodium chloride (NS) flush 5-40 mL   5-40 mL  IntraVENous  Q8H     ?  sodium chloride (NS) flush 5-40 mL   5-40 mL  IntraVENous  PRN     ?  vancomycin (VANCOCIN) 1,000 mg in 0.9% sodium chloride 250 mL (VIAL-MATE)   1,000 mg  IntraVENous  Q16H     ?  sodium phosphate (FLEET'S) enema 1 Enema   1 Enema  Rectal  PRN     ?  oxyCODONE IR (ROXICODONE) tablet 5 mg   5 mg  Oral  Q4H PRN     ?  insulin glargine (LANTUS) injection 16 Units   16 Units  SubCUTAneous  QHS     ?  Vancomycin- Pharmacy to Dose     Other  Rx Dosing/Monitoring     ?  gabapentin (NEURONTIN) capsule 300 mg   300 mg  Oral  QHS     ?  losartan (COZAAR) tablet 100 mg   100 mg  Oral  DAILY     ?  docusate sodium (COLACE) capsule 100 mg   100 mg  Oral  BID     ?  senna (SENOKOT) tablet 8.6 mg   1 Tab  Oral  DAILY     ?  polyethylene glycol (MIRALAX) packet 17 g   17 g  Oral  DAILY PRN     ?  bisacodyL (DULCOLAX) suppository 10 mg   10 mg  Rectal  DAILY PRN           ?  hydroCHLOROthiazide (HYDRODIURIL) tablet 25 mg   25 mg  Oral  DAILY           ?  sodium chloride (NS) flush 5-40 mL   5-40 mL  IntraVENous  Q8H     ?  sodium chloride (NS) flush 5-40 mL   5-40 mL  IntraVENous  PRN     ?  acetaminophen (TYLENOL) tablet 650 mg   650 mg  Oral  Q6H PRN          Or           ?  acetaminophen (TYLENOL) suppository 650 mg   650 mg  Rectal  Q6H PRN     ?  promethazine (PHENERGAN) tablet 12.5 mg   12.5 mg  Oral  Q6H PRN          Or           ?  ondansetron (ZOFRAN) injection 4 mg   4 mg  IntraVENous  Q6H PRN     ?  glucose chewable tablet 16 g   4 Tab  Oral  PRN     ?  dextrose (D50W) injection syrg 12.5-25 g   25-50 mL  IntraVENous  PRN     ?  glucagon (GLUCAGEN) injection 1 mg   1 mg  IntraMUSCular  PRN     ?  insulin lispro (HUMALOG) injection     SubCUTAneous  AC&HS     ?  aspirin delayed-release tablet 81 mg   81 mg  Oral  DAILY     ?   pantoprazole (PROTONIX) tablet 40 mg   40 mg  Oral  ACB     ?  amLODIPine (NORVASC) tablet 10 mg   10 mg  Oral  DAILY           ?  enoxaparin (LOVENOX) injection 40 mg   40 mg  SubCUTAneous  Q24H        ______________________________________________________________________   EXPECTED LENGTH OF STAY: 4d 2h   ACTUAL LENGTH OF STAY:          [place], NP

## 2019-12-01 NOTE — Progress Notes (Signed)
Vascular:    Leg incisions OK, palpable graft pulse, foot dressed    Doing well POD # 3 after left leg bypass.     Continue current care.

## 2019-12-01 NOTE — Progress Notes (Signed)
Bedside and Verbal shift change report given to Christy (oncoming nurse) by [name] (offgoing nurse). Report included the following information SBAR, Kardex, Procedure Summary, Intake/Output and MAR.

## 2019-12-02 DIAGNOSIS — J3089 Other allergic rhinitis: Secondary | ICD-10-CM | POA: Diagnosis not present

## 2019-12-02 DIAGNOSIS — J301 Allergic rhinitis due to pollen: Secondary | ICD-10-CM | POA: Diagnosis not present

## 2019-12-02 LAB — GLUCOSE, POC
Glucose (POC): 171 mg/dL — ABNORMAL HIGH (ref 65–100)
Glucose (POC): 128 mg/dL — ABNORMAL HIGH (ref 65–100)
Glucose (POC): 207 mg/dL — ABNORMAL HIGH (ref 65–100)
Glucose (POC): 151 mg/dL — ABNORMAL HIGH (ref 65–100)

## 2019-12-02 LAB — CBC WITH AUTOMATED DIFF
ABS. BASOPHILS: 0 10*3/uL (ref 0.0–0.1)
ABS. EOSINOPHILS: 0.7 10*3/uL — ABNORMAL HIGH (ref 0.0–0.4)
ABS. IMM. GRANS.: 0 10*3/uL (ref 0.00–0.04)
ABS. LYMPHOCYTES: 1 10*3/uL (ref 0.8–3.5)
ABS. MONOCYTES: 0.8 10*3/uL (ref 0.0–1.0)
ABS. NEUTROPHILS: 4.2 10*3/uL (ref 1.8–8.0)
ABSOLUTE NRBC: 0 10*3/uL (ref 0.00–0.01)
BASOPHILS: 1 % (ref 0–1)
EOSINOPHILS: 10 % — ABNORMAL HIGH (ref 0–7)
HCT: 24.8 % — ABNORMAL LOW (ref 35.0–47.0)
HGB: 8.4 g/dL — ABNORMAL LOW (ref 11.5–16.0)
IMMATURE GRANULOCYTES: 0 % (ref 0.0–0.5)
LYMPHOCYTES: 14 % (ref 12–49)
MCH: 30.9 PG (ref 26.0–34.0)
MCHC: 33.9 g/dL (ref 30.0–36.5)
MCV: 91.2 FL (ref 80.0–99.0)
MONOCYTES: 11 % (ref 5–13)
MPV: 9.8 FL (ref 8.9–12.9)
NEUTROPHILS: 64 % (ref 32–75)
NRBC: 0 PER 100 WBC
PLATELET: 306 10*3/uL (ref 150–400)
RBC: 2.72 M/uL — ABNORMAL LOW (ref 3.80–5.20)
RDW: 12.9 % (ref 11.5–14.5)
WBC: 6.7 10*3/uL (ref 3.6–11.0)

## 2019-12-02 LAB — METABOLIC PANEL, BASIC
Anion gap: 9 mmol/L (ref 5–15)
BUN/Creatinine ratio: 18 (ref 12–20)
BUN: 18 MG/DL (ref 6–20)
CO2: 23 mmol/L (ref 21–32)
Calcium: 9.6 MG/DL (ref 8.5–10.1)
Chloride: 102 mmol/L (ref 97–108)
Creatinine: 0.98 MG/DL (ref 0.55–1.02)
GFR est AA: >60 mL/min/{1.73_m2} (ref >60)
GFR est non-AA: 56 mL/min/{1.73_m2} — ABNORMAL LOW (ref >60)
Glucose: 108 mg/dL — ABNORMAL HIGH (ref 65–100)
Potassium: 3.6 mmol/L (ref 3.5–5.1)
Sodium: 134 mmol/L — ABNORMAL LOW (ref 136–145)

## 2019-12-02 LAB — VANCOMYCIN, RANDOM: Vancomycin, random: 5.9 UG/ML

## 2019-12-02 LAB — POCT GLUCOSE
POC Glucose: 171 mg/dL — ABNORMAL HIGH (ref 65–100)
POC Glucose: 128 mg/dL — ABNORMAL HIGH (ref 65–100)
POC Glucose: 207 mg/dL — ABNORMAL HIGH (ref 65–100)
POC Glucose: 151 mg/dL — ABNORMAL HIGH (ref 65–100)

## 2019-12-02 LAB — BASIC METABOLIC PANEL
Anion Gap: 9 mmol/L (ref 5–15)
BUN/Creatinine Ratio: 18 (ref 12–20)
BUN: 18 MG/DL (ref 6–20)
CO2: 23 mmol/L (ref 21–32)
Calcium: 9.6 MG/DL (ref 8.5–10.1)
Chloride: 102 mmol/L (ref 97–108)
Creatinine: 0.98 MG/DL (ref 0.55–1.02)
GFR African American: >60 mL/min/{1.73_m2} (ref >60)
Glucose: 108 mg/dL — ABNORMAL HIGH (ref 65–100)
Potassium: 3.6 mmol/L (ref 3.5–5.1)
Sodium: 134 mmol/L — ABNORMAL LOW (ref 136–145)
eGFR NON-AA: 56 mL/min/{1.73_m2} — ABNORMAL LOW (ref >60)

## 2019-12-02 LAB — VANCOMYCIN LEVEL, RANDOM: Vancomycin Rm: 5.9 UG/ML

## 2019-12-02 LAB — CBC WITH AUTO DIFFERENTIAL
Basophils %: 1 % (ref 0–1)
Basophils Absolute: 0 10*3/uL (ref 0.0–0.1)
Eosinophils %: 10 % — ABNORMAL HIGH (ref 0–7)
Eosinophils Absolute: 0.7 10*3/uL — ABNORMAL HIGH (ref 0.0–0.4)
Granulocyte Absolute Count: 0 10*3/uL (ref 0.00–0.04)
Hematocrit: 24.8 % — ABNORMAL LOW (ref 35.0–47.0)
Hemoglobin: 8.4 g/dL — ABNORMAL LOW (ref 11.5–16.0)
Immature Granulocytes %: 0 % (ref 0.0–0.5)
Lymphocytes %: 14 % (ref 12–49)
Lymphocytes Absolute: 1 10*3/uL (ref 0.8–3.5)
MCH: 30.9 PG (ref 26.0–34.0)
MCHC: 33.9 g/dL (ref 30.0–36.5)
MCV: 91.2 FL (ref 80.0–99.0)
MPV: 9.8 FL (ref 8.9–12.9)
Monocytes %: 11 % (ref 5–13)
Monocytes Absolute: 0.8 10*3/uL (ref 0.0–1.0)
NRBC Absolute: 0 10*3/uL (ref 0.00–0.01)
Neutrophils %: 64 % (ref 32–75)
Neutrophils Absolute: 4.2 10*3/uL (ref 1.8–8.0)
Nucleated RBCs: 0 PER 100 WBC
Platelets: 306 10*3/uL (ref 150–400)
RBC: 2.72 M/uL — ABNORMAL LOW (ref 3.80–5.20)
RDW: 12.9 % (ref 11.5–14.5)
WBC: 6.7 10*3/uL (ref 3.6–11.0)

## 2019-12-02 MED ORDER — PHARMACY VANCOMYCIN NOTE
Status: DC
Start: 2019-12-02 — End: 2019-12-09

## 2019-12-02 MED ORDER — PIPERACILLIN-TAZOBACTAM 3.375 GRAM IV SOLR
3.375 gram | Freq: Three times a day (TID) | INTRAVENOUS | Status: DC
Start: 2019-12-02 — End: 2019-12-02
  Administered 2019-12-02: 14:00:00 via INTRAVENOUS

## 2019-12-02 MED ORDER — VANCOMYCIN 1,000 MG IV SOLR
1000 mg | INTRAVENOUS | Status: DC
Start: 2019-12-02 — End: 2019-12-04
  Administered 2019-12-02 – 2019-12-04 (×4): via INTRAVENOUS

## 2019-12-02 MED ORDER — SODIUM CHLORIDE 0.9 % IV PIGGY BACK
2 gram | Freq: Three times a day (TID) | INTRAVENOUS | Status: DC
Start: 2019-12-02 — End: 2019-12-02

## 2019-12-02 MED ORDER — VIAL-MATE ADAPTOR
1000 mg | Status: DC
Start: 2019-12-02 — End: 2019-12-02
  Administered 2019-12-02: 17:00:00 via INTRAVENOUS

## 2019-12-02 MED ORDER — METRONIDAZOLE IN SODIUM CHLORIDE (ISO-OSM) 500 MG/100 ML IV PIGGY BACK
500 mg/100 mL | Freq: Two times a day (BID) | INTRAVENOUS | Status: DC
Start: 2019-12-02 — End: 2019-12-09
  Administered 2019-12-02 – 2019-12-09 (×14): via INTRAVENOUS

## 2019-12-02 MED ORDER — SODIUM CHLORIDE 0.9 % IV PIGGY BACK
2 gram | Freq: Two times a day (BID) | INTRAVENOUS | Status: DC
Start: 2019-12-02 — End: 2019-12-04
  Administered 2019-12-03 – 2019-12-04 (×3): via INTRAVENOUS

## 2019-12-02 MED ORDER — METRONIDAZOLE IN SODIUM CHLORIDE (ISO-OSM) 500 MG/100 ML IV PIGGY BACK
500 mg/100 mL | Freq: Two times a day (BID) | INTRAVENOUS | Status: DC
Start: 2019-12-02 — End: 2019-12-02

## 2019-12-02 MED ORDER — CEFEPIME 2 GRAM SOLUTION FOR INJECTION
2 gram | Freq: Three times a day (TID) | INTRAMUSCULAR | Status: DC
Start: 2019-12-02 — End: 2019-12-02
  Administered 2019-12-02: 20:00:00 via INTRAVENOUS

## 2019-12-02 MED ORDER — PHARMACY VANCOMYCIN NOTE
Freq: Once | Status: AC
Start: 2019-12-02 — End: 2019-12-02
  Administered 2019-12-02: 15:00:00

## 2019-12-02 MED FILL — ENOXAPARIN 40 MG/0.4 ML SUB-Q SYRINGE: 40 mg/0.4 mL | SUBCUTANEOUS | Qty: 0.4

## 2019-12-02 MED FILL — INSULIN GLARGINE 100 UNIT/ML INJECTION: 100 unit/mL | SUBCUTANEOUS | Qty: 1

## 2019-12-02 MED FILL — PIPERACILLIN-TAZOBACTAM 3.375 GRAM IV SOLR: 3.375 gram | INTRAVENOUS | Qty: 3.38

## 2019-12-02 MED FILL — CEFEPIME 2 GRAM SOLUTION FOR INJECTION: 2 gram | INTRAMUSCULAR | Qty: 2

## 2019-12-02 MED FILL — OXYCODONE 5 MG TAB: 5 mg | ORAL | Qty: 1

## 2019-12-02 MED FILL — SENNA LAX 8.6 MG TABLET: 8.6 mg | ORAL | Qty: 1

## 2019-12-02 MED FILL — INSULIN LISPRO 100 UNIT/ML INJECTION: 100 unit/mL | SUBCUTANEOUS | Qty: 1

## 2019-12-02 MED FILL — AMLODIPINE 5 MG TAB: 5 mg | ORAL | Qty: 2

## 2019-12-02 MED FILL — VANCOMYCIN 1,000 MG IV SOLR: 1000 mg | INTRAVENOUS | Qty: 1000

## 2019-12-02 MED FILL — PHARMACY VANCOMYCIN NOTE: Qty: 1

## 2019-12-02 MED FILL — ACETAMINOPHEN 325 MG TABLET: 325 mg | ORAL | Qty: 2

## 2019-12-02 MED FILL — LOSARTAN 50 MG TAB: 50 mg | ORAL | Qty: 2

## 2019-12-02 MED FILL — PANTOPRAZOLE 40 MG TAB, DELAYED RELEASE: 40 mg | ORAL | Qty: 1

## 2019-12-02 MED FILL — ASPIRIN 81 MG TAB, DELAYED RELEASE: 81 mg | ORAL | Qty: 1

## 2019-12-02 MED FILL — GABAPENTIN 300 MG CAP: 300 mg | ORAL | Qty: 1

## 2019-12-02 MED FILL — METRO I.V. 500 MG/100 ML INTRAVENOUS PIGGYBACK: 500 mg/100 mL | INTRAVENOUS | Qty: 100

## 2019-12-02 MED FILL — DOK 100 MG CAPSULE: 100 mg | ORAL | Qty: 1

## 2019-12-02 MED FILL — HYDROCHLOROTHIAZIDE 25 MG TAB: 25 mg | ORAL | Qty: 1

## 2019-12-02 NOTE — Progress Notes (Signed)
Problem: Mobility Impaired (Adult and Pediatric)  Goal: *Acute Goals and Plan of Care (Insert Text)  Description: FUNCTIONAL STATUS PRIOR TO ADMISSION: Patient was independent and active without use of DME.    HOME SUPPORT PRIOR TO ADMISSION: The patient lived alone with no local support.    Physical Therapy  Reassessed post-surgery 11/29/2019  1.  Patient will move from supine to sit and sit to supine , scoot up and down, and roll side to side in bed with supervision/set-up within 7 day(s).    2.  Patient will transfer from bed to chair and chair to bed with supervision/set-up using the least restrictive device within 7 day(s).  3.  Patient will perform sit to stand with supervision/set-up within 7 day(s).  4.  Patient will ambulate with supervision/set-up for 50 feet with the least restrictive device within 7 day(s).   5.  Patient will ascend/descend 4 stairs with cane and one handrail(s) with supervision/set-up within 7 day(s).   Goals remain appropriate and will continue to work toward same.      Physical Therapy Goals  Initiated 11/26/2019  1.  Patient will move from supine to sit and sit to supine , scoot up and down, and roll side to side in bed with supervision/set-up within 7 day(s).    2.  Patient will transfer from bed to chair and chair to bed with supervision/set-up using the least restrictive device within 7 day(s).  3.  Patient will perform sit to stand with supervision/set-up within 7 day(s).  4.  Patient will ambulate with supervision/set-up for 50 feet with the least restrictive device within 7 day(s).   5.  Patient will ascend/descend 4 stairs with cane and one handrail(s) with supervision/set-up within 7 day(s).     Outcome: Progressing Towards Goal  PHYSICAL THERAPY TREATMENT  Patient: Lindsey Huynh [id number] y.o. 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    ASSESSMENT  Patient continues with skilled PT services and is progressing towards goals. She had no verbal c/o pain this session. Physician present prior to PT for dressing change. Noted order in chart for post-op shoe and one provided. Instructed pt to wear when OOB and performing transfers/mobility. Today, she was able to complete transfers and short gait w/ Min Ax1 overall. Amb approx 8 FT total w/RW, demonstrating PWB on left. VC for hand placement on RW, upright posture as she was in flexed position. VC for BUE support/WBing through RW when advancing R foot to offload Left. Also required VC for hand placement w/ sit<>stand transfers to RW (pt pulling on RW). Also required safety cues for hand placement and eccentric control when sitting (pt "plopped" w/ hands still on RW). She declined chair at this time and preferred to get back to bed; reports she had been sitting up in chair.     Pt would like to go home, however does live alone and has STE. Discussed w/ pt that she would need to be able to perform stairs safely if she wants to d/c home. Will tentatively plan for stair training as pt's mobility progresses. Pending her mobility progress w/ acute PT, recommend IPR vs HHPT w/ assist/support.     Current Level of Function Impacting Discharge (mobility/balance): Min Ax1 overall for functional transfers and short gait w/ RW.  Other factors to consider for discharge: lives alone w/ no local support. WBAT on Left, currently demonstrating PWB w/Post-op shoe and RW support w/ Assist x1         PLAN :  Patient continues to benefit from skilled intervention to address the above impairments.  Continue treatment per established plan of care.  to address goals.    Recommendation for discharge: (in order for the patient to meet his/her long term goals)  To be determined: Rehab (IPR) vs HHPT and family assist/support for all mobility,  transfers and I/ADL's pending pt's mobility progress. If pt able to progress gait to household distance as well as complete and clear stair training, she may be able to d/c home w/ assist/support.  If not, then recommend IP Rehab.     This discharge recommendation:  Has been made in collaboration with the attending provider and/or case management    IF patient discharges home will need the following DME: rolling walker and wheelchair (for long distances)       SUBJECTIVE:   Patient stated ???Oo,my shoe looks so pretty. Can I leave it on so it looks like I did something?" instructed pt she can take post-op shoe off when in bed, but to wear once OOB and w/ mobiltiy.     OBJECTIVE DATA SUMMARY:   Critical Behavior:  Neurologic State: Alert, Appropriate for age  Orientation Level: Oriented X4  Cognition: Appropriate decision making, Appropriate for age attention/concentration, Appropriate safety awareness, Follows commands     Functional Mobility Training:  Bed Mobility:     Supine to Sit: Minimum assistance;Assist x1  Sit to Supine: Minimum assistance;Assist x1  Scooting: Stand-by assistance;Contact guard assistance;Assist x1(SBA-pt able to scoot to [facility])        Transfers:  Sit to Stand: Minimum assistance;Additional time;Assist x1(+ VC for hand placement)  Stand to Sit: Minimum assistance;Assist x1(VC for hand placement when sitting (pt "plopped"))                             Balance:  Sitting: Intact  Sitting - Static: Good (unsupported)  Sitting - Dynamic: Good (unsupported)  Ambulation/Gait Training:  Distance (ft): 8 Feet (ft)  Assistive Device: Gait belt;Walker, rolling  Ambulation - Level of Assistance: Minimal assistance;Additional time;Assist x1        Gait Abnormalities: Antalgic;Decreased step clearance;Step to gait     Left Side Weight Bearing: As tolerated(now has post-op shoe)  Base of Support: Narrowed;Shift to left  Stance: Left decreased  Speed/Cadence: Slow  Step Length: Left shortened;Right shortened   Swing Pattern: Left asymmetrical     Interventions: Safety awareness training;Verbal cues    Pain Rating:  No verbal c/o pain    Activity Tolerance:   Good    After treatment patient left in no apparent distress:   Supine in bed, Heels elevated for pressure relief, Call bell within reach, and Side rails x 3    COMMUNICATION/COLLABORATION:   The patient???s plan of care was discussed with: Registered nurse.     Miranda A Means,PTA   Time Calculation: 34 mins

## 2019-12-02 NOTE — Consults (Signed)
ID Consult Note  NAME:  Lindsey Huynh   DOB:   [birth year]   MRN:   [id number]   Date/Time:  12/02/2019 11:35 AM  Subjective:   REASON FOR CONSULT:  Osteomyelitis of the left great toe    Lindsey Huynh is a 69 y.o. with a history of Diabetes, hyperlipidemia and hypertension.  She has been having left great toe pain for the past 2 months.  Over the past few weeks, it has become very severe.  The pain is nonradiating and there were no aggravating or alleviating factors.  The left great toe was somewhat swollen.  She did not have any fever or any chills.  Because of her symptoms, she came here to [facility].  She was diagnosed to have peripheral vascular disease.  She was brought to the operating room by Dr. Timmothy Sours. He performed a left mid superficial femoral artery to proximal peroneal artery bypass.  Podiatry was also consulted because there was some necrotic changes to the medial part of the second left toe as well as the dorsal portion of the Left great toe.  An MRI was done which revealed osteomyelitis of the distal phalanx of the left great toe.  She is currently on vancomycin and cefepime and we are being asked to see her in consult.      Past Medical History:   Diagnosis Date   ??? Diabetes ([place])    ??? GERD (gastroesophageal reflux disease)    ??? Hypercholesterolemia    ??? Hypertension       Past Surgical History:   Procedure Laterality Date   ??? HX COLONOSCOPY     ??? HX ORTHOPAEDIC  06-30-10    back surgery (tumor removed)   ??? 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 Smoking status: Former Smoker     Years: 10.00   ??? Smokeless tobacco: Never Used   Substance Use Topics   ??? Alcohol use: Yes     Alcohol/week: 5.0 standard drinks     Types: 6 Cans of beer per week      Family History     Allergies   Allergen Reactions   ??? Neuromuscular Blockers, Steroidal Other (comments)     GI Upset      Home Medications:  Prior to Admission Medications   Prescriptions Last Dose  Informant Patient Reported? Taking?   Insulin Needles, Disposable, (Nano Pen Needle) 32 gauge x 5/32" ndle   No No   Sig: Use with insulin pen 3 times daily   amLODIPine (NORVASC) 10 mg tablet   No No   Sig: Take 1 Tab by mouth daily.   aspirin delayed-release 81 mg tablet   Yes No   Sig: Take 81 mg by mouth daily.   cyclopentolate (CYCLOGYL) 1 % ophthalmic solution   Yes No   gabapentin (NEURONTIN) 300 mg capsule   No No   Sig: Take 1 Cap by mouth nightly. Max Daily Amount: 300 mg.   insulin detemir U-100 (Levemir FlexTouch U-100 Insuln) 100 unit/mL (3 mL) inpn   No No   Sig: 20 Units by SubCUTAneous route daily (with breakfast).   metFORMIN ER (GLUCOPHAGE XR) 500 mg tablet   No No   Sig: Take 2 Tabs by mouth daily (with dinner).   omeprazole (PRILOSEC) 20 mg capsule   Yes No   Sig: Take 20 mg by mouth daily.  valsartan-hydroCHLOROthiazide (DIOVAN-HCT) 160-25 mg per tablet   No No   Sig: Take 1 Tab by mouth daily.      Facility-Administered Medications: None     Hospital medications:  Current Facility-Administered Medications   Medication Dose Route Frequency   ??? piperacillin-tazobactam (ZOSYN) 3.375 g in 0.9% sodium chloride (MBP/ADV) 100 mL MBP  3.375 g IntraVENous Q8H   ??? Vancomycin- Pharmacy to Dose   Other Rx Dosing/Monitoring   ??? sodium chloride (NS) flush 5-40 mL  5-40 mL IntraVENous Q8H   ??? sodium chloride (NS) flush 5-40 mL  5-40 mL IntraVENous PRN   ??? sodium phosphate (FLEET'S) enema 1 Enema  1 Enema Rectal PRN   ??? oxyCODONE IR (ROXICODONE) tablet 5 mg  5 mg Oral Q4H PRN   ??? insulin glargine (LANTUS) injection 16 Units  16 Units SubCUTAneous QHS   ??? gabapentin (NEURONTIN) capsule 300 mg  300 mg Oral QHS   ??? losartan (COZAAR) tablet 100 mg  100 mg Oral DAILY   ??? docusate sodium (COLACE) capsule 100 mg  100 mg Oral BID   ??? senna (SENOKOT) tablet 8.6 mg  1 Tab Oral DAILY   ??? polyethylene glycol (MIRALAX) packet 17 g  17 g Oral DAILY PRN   ??? bisacodyL (DULCOLAX) suppository 10 mg  10 mg Rectal DAILY PRN   ???  hydroCHLOROthiazide (HYDRODIURIL) tablet 25 mg  25 mg Oral DAILY   ??? sodium chloride (NS) flush 5-40 mL  5-40 mL IntraVENous Q8H   ??? sodium chloride (NS) flush 5-40 mL  5-40 mL IntraVENous PRN   ??? acetaminophen (TYLENOL) tablet 650 mg  650 mg Oral Q6H PRN    Or   ??? acetaminophen (TYLENOL) suppository 650 mg  650 mg Rectal Q6H PRN   ??? promethazine (PHENERGAN) tablet 12.5 mg  12.5 mg Oral Q6H PRN    Or   ??? ondansetron (ZOFRAN) injection 4 mg  4 mg IntraVENous Q6H PRN   ??? glucose chewable tablet 16 g  4 Tab Oral PRN   ??? dextrose (D50W) injection syrg 12.5-25 g  25-50 mL IntraVENous PRN   ??? glucagon (GLUCAGEN) injection 1 mg  1 mg IntraMUSCular PRN   ??? insulin lispro (HUMALOG) injection   SubCUTAneous AC&HS   ??? aspirin delayed-release tablet 81 mg  81 mg Oral DAILY   ??? pantoprazole (PROTONIX) tablet 40 mg  40 mg Oral ACB   ??? amLODIPine (NORVASC) tablet 10 mg  10 mg Oral DAILY   ??? enoxaparin (LOVENOX) injection 40 mg  40 mg SubCUTAneous Q24H     REVIEW OF SYSTEMS:        Const:    negative weight loss  Eyes:   negative diplopia or visual changes, negative eye pain  ENT:   negative coryza, negative sore throat  Resp:   negative cough, hemoptysis, dyspnea  Cards:  negative for chest pain, palpitations  GU:  negative for frequency, dysuria and hematuria  GI:   Negative for hematemesis and hematochezia  Skin:   negative for rash and pruritus  Heme:   negative for easy bruising and gum/nose bleeding  MS:  negative for myalgias, arthralgias  Neurolo:  negative for headaches, dizziness, vertigo, memory problems   Psych:  negative for hallucinations        Objective:   VITALS:    Visit Vitals  BP 132/73 (BP Patient Position: At rest)   Pulse 81   Temp 98 ??F (36.7 ??C)   Resp 16   Ht 5'  5" (1.651 m)   Wt 76.9 kg (169 lb 8 oz)   SpO2 99%   BMI 28.21 kg/m??     Temp (24hrs), Avg:98 ??F (36.7 ??C), Min:97.3 ??F (36.3 ??C), Max:98.6 ??F (37 ??C)    PHYSICAL EXAM:   General:    Alert, cooperative, no distress, appears stated age.     Head:    Normocephalic, without obvious abnormality, atraumatic.  Eyes:   Conjunctivae clear, anicteric sclerae.    Nose:  Nares normal.   Throat:    Lips and tongue normal.  No Thrush  Neck:  Supple, symmetrical    no carotid bruit and no JVD.  GU:    No CVA tenderness, no foley catheter  Lungs:   Clear to auscultation bilaterally.  No Wheezing or Rhonchi. No rales.  Heart:   Regular rate and rhythm,  no murmur, rub or gallop.  Abdomen:   Soft, non-tender,not distended.  Bowel sounds normal.   Extremities: The left great toe has some necrosis on the nailbed.  The second left toe has some necrosis on the medial portion.  Skin:     No rashes or lesions.  Not Jaundiced  Lymph: Cervical normal  Neurologic: Full use of extraocular muscles, no facial asymmetry, tongue midline muscle strength 5 out of 5    LAB DATA REVIEWED:    Recent Results (from the past 48 hour(s))   GLUCOSE, POC    Collection Time: 11/30/19 11:41 AM   Result Value Ref Range    Glucose (POC) 160 (H) 65 - 100 mg/dL    Performed by Drue Flirt PCT    GLUCOSE, POC    Collection Time: 11/30/19  4:31 PM   Result Value Ref Range    Glucose (POC) 135 (H) 65 - 100 mg/dL    Performed by Higinio Plan (CON)    GLUCOSE, POC    Collection Time: 11/30/19  8:38 PM   Result Value Ref Range    Glucose (POC) 131 (H) 65 - 100 mg/dL    Performed by Quin Hoop    CBC WITH AUTOMATED DIFF    Collection Time: 12/01/19  5:54 AM   Result Value Ref Range    WBC 8.4 3.6 - 11.0 K/uL    RBC 2.70 (L) 3.80 - 5.20 M/uL    HGB 8.4 (L) 11.5 - 16.0 g/dL    HCT 24.6 (L) 35.0 - 47.0 %    MCV 91.1 80.0 - 99.0 FL    MCH 31.1 26.0 - 34.0 PG    MCHC 34.1 30.0 - 36.5 g/dL    RDW 13.1 11.5 - 14.5 %    PLATELET 306 150 - 400 K/uL    MPV 10.4 8.9 - 12.9 FL    NRBC 0.0 0 PER 100 WBC    ABSOLUTE NRBC 0.00 0.00 - 0.01 K/uL    NEUTROPHILS 73 32 - 75 %    LYMPHOCYTES 11 (L) 12 - 49 %    MONOCYTES 10 5 - 13 %    EOSINOPHILS 5 0 - 7 %    BASOPHILS 0 0 - 1 %    IMMATURE GRANULOCYTES 1 (H) 0.0 - 0.5 %    ABS.  NEUTROPHILS 6.2 1.8 - 8.0 K/UL    ABS. LYMPHOCYTES 0.9 0.8 - 3.5 K/UL    ABS. MONOCYTES 0.8 0.0 - 1.0 K/UL    ABS. EOSINOPHILS 0.4 0.0 - 0.4 K/UL    ABS. BASOPHILS 0.0 0.0 - 0.1 K/UL    ABS. IMM.  GRANS. 0.0 0.00 - 0.04 K/UL    DF AUTOMATED     METABOLIC PANEL, BASIC    Collection Time: 12/01/19  5:54 AM   Result Value Ref Range    Sodium 135 (L) 136 - 145 mmol/L    Potassium 3.2 (L) 3.5 - 5.1 mmol/L    Chloride 101 97 - 108 mmol/L    CO2 24 21 - 32 mmol/L    Anion gap 10 5 - 15 mmol/L    Glucose 126 (H) 65 - 100 mg/dL    BUN 16 6 - 20 MG/DL    Creatinine 0.93 0.55 - 1.02 MG/DL    BUN/Creatinine ratio 17 12 - 20      GFR est AA >60 >60 ml/min/1.79m2    GFR est non-AA 60 (L) >60 ml/min/1.40m2    Calcium 10.1 8.5 - 10.1 MG/DL   GLUCOSE, POC    Collection Time: 12/01/19  6:14 AM   Result Value Ref Range    Glucose (POC) 147 (H) 65 - 100 mg/dL    Performed by [place], POC    Collection Time: 12/01/19 11:09 AM   Result Value Ref Range    Glucose (POC) 173 (H) 65 - 100 mg/dL    Performed by Drue Flirt PCT    GLUCOSE, POC    Collection Time: 12/01/19  5:00 PM   Result Value Ref Range    Glucose (POC) 170 (H) 65 - 100 mg/dL    Performed by Crist Infante  PCT    GLUCOSE, POC    Collection Time: 12/01/19  9:05 PM   Result Value Ref Range    Glucose (POC) 171 (H) 65 - 100 mg/dL    Performed by Quin Hoop    CBC WITH AUTOMATED DIFF    Collection Time: 12/02/19  5:40 AM   Result Value Ref Range    WBC 6.7 3.6 - 11.0 K/uL    RBC 2.72 (L) 3.80 - 5.20 M/uL    HGB 8.4 (L) 11.5 - 16.0 g/dL    HCT 24.8 (L) 35.0 - 47.0 %    MCV 91.2 80.0 - 99.0 FL    MCH 30.9 26.0 - 34.0 PG    MCHC 33.9 30.0 - 36.5 g/dL    RDW 12.9 11.5 - 14.5 %    PLATELET 306 150 - 400 K/uL    MPV 9.8 8.9 - 12.9 FL    NRBC 0.0 0 PER 100 WBC    ABSOLUTE NRBC 0.00 0.00 - 0.01 K/uL    NEUTROPHILS 64 32 - 75 %    LYMPHOCYTES 14 12 - 49 %    MONOCYTES 11 5 - 13 %    EOSINOPHILS 10 (H) 0 - 7 %    BASOPHILS 1 0 - 1 %    IMMATURE GRANULOCYTES 0 0.0 - 0.5 %     ABS. NEUTROPHILS 4.2 1.8 - 8.0 K/UL    ABS. LYMPHOCYTES 1.0 0.8 - 3.5 K/UL    ABS. MONOCYTES 0.8 0.0 - 1.0 K/UL    ABS. EOSINOPHILS 0.7 (H) 0.0 - 0.4 K/UL    ABS. BASOPHILS 0.0 0.0 - 0.1 K/UL    ABS. IMM. GRANS. 0.0 0.00 - 0.04 K/UL    DF AUTOMATED     METABOLIC PANEL, BASIC    Collection Time: 12/02/19  5:40 AM   Result Value Ref Range    Sodium 134 (L) 136 - 145 mmol/L    Potassium 3.6 3.5 - 5.1 mmol/L  Chloride 102 97 - 108 mmol/L    CO2 23 21 - 32 mmol/L    Anion gap 9 5 - 15 mmol/L    Glucose 108 (H) 65 - 100 mg/dL    BUN 18 6 - 20 MG/DL    Creatinine 0.98 0.55 - 1.02 MG/DL    BUN/Creatinine ratio 18 12 - 20      GFR est AA >60 >60 ml/min/1.81m2    GFR est non-AA 56 (L) >60 ml/min/1.58m2    Calcium 9.6 8.5 - 10.1 MG/DL   GLUCOSE, POC    Collection Time: 12/02/19  6:13 AM   Result Value Ref Range    Glucose (POC) 128 (H) 65 - 100 mg/dL    Performed by Quin Hoop    GLUCOSE, POC    Collection Time: 12/02/19 11:23 AM   Result Value Ref Range    Glucose (POC) 207 (H) 65 - 100 mg/dL    Performed by Sandi Raveling          IMPRESSION    #1 diabetic left foot infection with osteomyelitis of the left great toe    #2 hyperlipidemia    #3 hypertension    #4 diabetes      PLAN    I discussed with the patient that she has bone infection in the left great toe.  I explained to her that there could be 2 options either an amputation of at least the distal part of the left great toe or just treatment with antibiotics.  There is a significant failure rate with the latter I explained.  She will need podiatry follow-up.  We should inform Dr. Katrinka Blazing about the findings.  I will change Zosyn to cefepime and Flagyl.  We should continue vancomycin.                   ___________________________________________________  ID: Blair Hailey, MD

## 2019-12-02 NOTE — Progress Notes (Signed)
Francoise Schaumann, DPM - Greig Castilla K. 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 Pt is s/p fem pop bypass (vascular surgery following), diabetes with ulcer, peripheral vascular ds    Discussed with vascular surgery    Pt evaluated and tx. Dressing consisting of betadine/dsd applied, recommend HHC with betadine and dsd daily to left 1, 2 toes    Discussed risks of toe amputation, biopsy, such as need for further amputation/slow wound healing and benefits such as treatment of ulceration, osteomyelitis. Patient electing for conservative care with local wound care and IV antibiotic treatment (per ID recommendations)     Pt can f/u with me at the [facility] within one week of discharge (Wednesday afternoons)     Do not hesitate to contact us via [employer] or phone (personal [phone]) with any questions.    Subjective:  Pt complains of wound to left 1, 2 toes.  Previous tx include none.  negative for fever, chills, nausea, vomiting, chest pain, shortness of breath.      HPI: relates improved pain today    ROS:  Consitutional: no weight loss, night sweats, fatigue / malaise / lethargy.  Musculoskeletal: no joint / extremity pain, misalignment, stiffness, decreased ROM, crepitus.  Integument: No pruritis, rashes, lesions, left toe wounds.  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 Neuromuscular Blockers, Steroidal Other (comments)     GI Upset     History reviewed. No pertinent family history.   @HXMEDICAL @  Past Surgical History:   Procedure Laterality Date   ??? HX COLONOSCOPY      ??? HX ORTHOPAEDIC  06-30-10    back surgery (tumor removed)   ??? 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 Smoking status: Former Smoker     Years: 10.00   ??? Smokeless tobacco: Never Used   Substance Use Topics   ??? Alcohol use: Yes     Alcohol/week: 5.0 standard drinks     Types: 6 Cans of beer per week       Social History     Substance and Sexual Activity   Alcohol Use Yes   ??? Alcohol/week: 5.0 standard drinks   ??? Types: 6 Cans of beer per week     Social History     Substance and Sexual Activity   Drug Use No      Social History     Tobacco Use   Smoking Status Former Smoker   ??? Years: 10.00   Smokeless Tobacco Never Used     Current Facility-Administered Medications   Medication Dose Route Frequency   ??? piperacillin-tazobactam (  ZOSYN) 3.375 g in 0.9% sodium chloride (MBP/ADV) 100 mL MBP  3.375 g IntraVENous Q8H   ??? Vancomycin- Pharmacy to Dose   Other Rx Dosing/Monitoring   ??? sodium chloride (NS) flush 5-40 mL  5-40 mL IntraVENous Q8H   ??? sodium chloride (NS) flush 5-40 mL  5-40 mL IntraVENous PRN   ??? sodium phosphate (FLEET'S) enema 1 Enema  1 Enema Rectal PRN   ??? oxyCODONE IR (ROXICODONE) tablet 5 mg  5 mg Oral Q4H PRN   ??? insulin glargine (LANTUS) injection 16 Units  16 Units SubCUTAneous QHS   ??? gabapentin (NEURONTIN) capsule 300 mg  300 mg Oral QHS   ??? losartan (COZAAR) tablet 100 mg  100 mg Oral DAILY   ??? docusate sodium (COLACE) capsule 100 mg  100 mg Oral BID   ??? senna (SENOKOT) tablet 8.6 mg  1 Tab Oral DAILY   ??? polyethylene glycol (MIRALAX) packet 17 g  17 g Oral DAILY PRN   ??? bisacodyL (DULCOLAX) suppository 10 mg  10 mg Rectal DAILY PRN   ??? hydroCHLOROthiazide (HYDRODIURIL) tablet 25 mg  25 mg Oral DAILY   ??? sodium chloride (NS) flush 5-40 mL  5-40 mL IntraVENous Q8H   ??? sodium chloride (NS) flush 5-40 mL  5-40 mL IntraVENous PRN   ??? acetaminophen (TYLENOL) tablet 650 mg  650 mg Oral Q6H PRN    Or   ??? acetaminophen (TYLENOL) suppository 650 mg  650 mg  Rectal Q6H PRN   ??? promethazine (PHENERGAN) tablet 12.5 mg  12.5 mg Oral Q6H PRN    Or   ??? ondansetron (ZOFRAN) injection 4 mg  4 mg IntraVENous Q6H PRN   ??? glucose chewable tablet 16 g  4 Tab Oral PRN   ??? dextrose (D50W) injection syrg 12.5-25 g  25-50 mL IntraVENous PRN   ??? glucagon (GLUCAGEN) injection 1 mg  1 mg IntraMUSCular PRN   ??? insulin lispro (HUMALOG) injection   SubCUTAneous AC&HS   ??? aspirin delayed-release tablet 81 mg  81 mg Oral DAILY   ??? pantoprazole (PROTONIX) tablet 40 mg  40 mg Oral ACB   ??? amLODIPine (NORVASC) tablet 10 mg  10 mg Oral DAILY   ??? enoxaparin (LOVENOX) injection 40 mg  40 mg SubCUTAneous Q24H        Objective:  Vitals:   Patient Vitals for the past 12 hrs:   BP Temp Pulse Resp SpO2   12/02/19 0835 132/73 98 ??F (36.7 ??C) 81 16 99 %   12/02/19 0241 (!) 150/81 97.3 ??F (36.3 ??C) 76 15 100 %       Vascular:  B/L LE  DP 0/4; PT 0/4  capillary fill time brisk, pitting edema is present, skin temperature is cool, varicosities are present.    Dermatological:  Nails are thickened, discolored, painful to palpation, 64mm thick, with subungual debris.          There is no maceration of the interspaces of the feet b/l.  No focal hyperkeratosis    Wound: 1, 2   Location: left distal hallux and 2nd toe  Margins: flush, demarcating  Drainage: none  Odor: none  Wound base: necrotic, dry  Lymphangitic streaking? No.  Undermining? No.  Sinus tracts? No.  Exposed bone? No.  Subcutaneous crepitation on palpation? No.    Neurological:  DTR are present, protective sensation per 5.07 Semmes Weinstein monofilament is absent, patient is AAOx3, mood is normal. Epicritic sensation is intact.    Orthopedic:  B/L LE  are symmetric, ROM of ankle, STJ, 1st MTPJ is limited, MMT 5 out of 5 for B/L LE.  No pedal amputations noted    Constitutional: Pt is a well developed elderly female.        Imaging:  CT scan negative for osteomyelitis  Labs:  Recent Labs     12/02/19  0540   WBC 6.7   CREA 0.98   BUN 18   HGB 8.4*    HCT 24.8*   NA 134*   K 3.6   CL 102   CO2 23   GLU 108*

## 2019-12-02 NOTE — Progress Notes (Cosign Needed)
[facility]                                                                                          Hospitalist Progress Note  Hollice Espy, NP  Answering service: [phone] OR [phone] from in house phone        Date of Service:  12/02/2019  NAME:  Lindsey Huynh  DOB:  1951-07-04  MRN:  [id number]      Admission Summary:   From H&P 11/23/2019:  "Lindsey Huynh??is a 69 y.o.??female??with past medical history of diabetes, insulin-dependent, GERD and hypertension comes for return of left foot pain and swelling.  Patient is awake, alert and oriented able to answer my question follow my request, data also obtained in the ED staff and extensive chart review. ??As per collective reports, patient??left great toe pain which associated darkening of the skin on the distal end of the toe and swelling of the left foot the medial aspect of the Of the last 1 week and has been progressively getting worse. ??The only thing that he remembers was fungal nail cream. ??She reported since her left second toe is in contact with the first toe and that pain he also became slightly cellulitic, she also has some dorsal swelling of the foot as well. ??This all progress from being mildly achy to significant tender and swollen last 1 week and she came today because the pain was unbearable, 10/10, getting from her toe to her foot and made her ambulation significantly difficult. ??Although she continues to have pain at rest. ??She has not seek medical help for that until today, did not remember any trauma or injury to her feet. ??She is hemodynamically otherwise stable in the ED on her own. ??Patient was hemodynamically stable in the ED, and work revealed hypokalemia leukocytosis and x-ray the foot was without any acute abnormality. ??Given concerns for dry gangrene and cellulitis, she was started on IV vancomycin. Patient has no other medical complaints. ??Patient will be admitted to hospitalist??service for  evaluation management."    Interval history / Subjective:      F/U for left foot cellulitis, left great toe gangrene, DMII, HTN, GERD, Hypokalemia, left popliteal occlusion.  Patient seen and examined sitting up in bed. States that she is doing good and wants to go home.   Assisted patient out of bed to bedside chair. Generalized weakness.   S/p fem- pop bypass of left leg (03/11)- Four surgical incisions noted to left leg with staples that are intact. Incisions are clean and dry. No signs of infection noted. Pain controlled.   Discussed MRI results and ID consultation to assist with antibiotics.   No overnight events noted. No new complaints.   Assessment & Plan:     Left great toe Osteomyelitis   - MRI LLE: 1. First distal phalanx osteomyelitis.  2. No abscess.  - IV antibiotics  - Consult ID for antibiotic recommendations   - Podiatry following     LLE pain:??dry gangrene??of??left 2nd toe cellulitic changes/Left popliteal occlusion  -s/p left fem-pop bypass (03/11)  -CT LLE (03/06) NL  -Elevated Sed  rate and CRP  -BC (03/06) NGTD   -Pain control  - Podiatry??following   - MRI completed    Anemia   Stable, No sign of bleeding noted.   - hgb 8.4.   - Monitor labs and transfuse if hgb <7.0   ??  DMII:??hold metformin while IP  -Cont basal insulin, SSI  -Diabetic diet  -a1c 8.1  - DM following??  ??  HTN  -Controlled  -Cont home meds   ??  GERD  -Cont PPI  ??  Hypokalemia  -potassium 3.2  - Replaced   - Monitor and replace as needed.   ??  Code status: Full  DVT prophylaxis: Lovenox    Care Plan discussed with: Patient/Family, Nurse and Case Manager  Anticipated Disposition: Home w/Family  Anticipated Discharge: 24 hours to 48 hours     Hospital Problems  Date Reviewed: 2018-06-08          Codes Class Noted POA    Gangrene ([facility]) ICD-10-CM: [id number]  ICD-9-CM: 785.4  11/23/2019 Unknown                Review of Systems:   A comprehensive review of systems was negative except for that written in the HPI.       Vital Signs:    Last 24hrs  VS reviewed since prior progress note. Most recent are:  Visit Vitals  BP 132/79 (BP 1 Location: Left upper arm)   Pulse 74   Temp 98.3 ??F (36.8 ??C)   Resp 16   Ht 5\' 5"  (1.651 m)   Wt 76.9 kg (169 lb 8 oz)   SpO2 99%   BMI 28.21 kg/m??         Intake/Output Summary (Last 24 hours) at 12/02/2019 2023  Last data filed at 12/02/2019 0507  Gross per 24 hour   Intake ???   Output 350 ml   Net -350 ml        Physical Examination:             Constitutional:  No acute distress, cooperative, pleasant, answers questions    ENT:  Oral mucosa moist, oropharynx benign.    Resp:  CTA bilaterally. No wheezing/rhonchi/rales. No accessory muscle use   CV:  Regular rhythm, normal rate, no murmurs, gallops, rubs    GI:  Soft, non distended, non tender. normoactive bowel sounds    Musculoskeletal:  No edema, warm, 2+ pulses throughout    Neurologic:  Moves all extremities. Follows commands.  AAOx3, CN II-XII reviewed     Psych:  Good insight, Not anxious nor agitated.   Skin:  Good turgor, no rashes or ulcers and Four left leg surgical incisions noted, staples intact. No signs of infection noted       Data Review:    Review and/or order of clinical lab test  Review and/or order of tests in the radiology section of CPT  Review and/or order of tests in the medicine section of CPT      Labs:     Recent Labs     12/02/19  0540 12/01/19  0554   WBC 6.7 8.4   HGB 8.4* 8.4*   HCT 24.8* 24.6*   PLT 306 306     Recent Labs     12/02/19  0540 12/01/19  0554 11/30/19  0223   NA 134* 135* 133*   K 3.6 3.2* 3.3*   CL 102 101 100   CO2 23 24 24    BUN 18 16  11   CREA 0.98 0.93 0.81   GLU 108* 126* 120*   CA 9.6 10.1 9.5     No results for input(s): ALT, AP, TBIL, TBILI, TP, ALB, GLOB, GGT, AML, LPSE in the last 72 hours.    No lab exists for component: SGOT, GPT, AMYP, HLPSE  No results for input(s): INR, PTP, APTT, INREXT in the last 72 hours.   No results for input(s): FE, TIBC, PSAT, FERR in the last 72 hours.   No results found for: FOL, RBCF   No  results for input(s): PH, PCO2, PO2 in the last 72 hours.  No results for input(s): CPK, CKNDX, TROIQ in the last 72 hours.    No lab exists for component: CPKMB  Lab Results   Component Value Date/Time    Cholesterol, total 219 (H) 10/30/2018 10:27 AM    HDL Cholesterol 63 10/30/2018 10:27 AM    LDL, calculated 130 (H) 10/30/2018 10:27 AM    Triglyceride 131 10/30/2018 10:27 AM    CHOL/HDL Ratio 3.4 12/26/2007 09:12 AM     Lab Results   Component Value Date/Time    Glucose (POC) 151 (H) 12/02/2019 04:09 PM    Glucose (POC) 207 (H) 12/02/2019 11:23 AM    Glucose (POC) 128 (H) 12/02/2019 06:13 AM    Glucose (POC) 171 (H) 12/01/2019 09:05 PM    Glucose (POC) 170 (H) 12/01/2019 05:00 PM     No results found for: COLOR, APPRN, SPGRU, REFSG, PHU, PROTU, GLUCU, KETU, BILU, UROU, NITU, LEUKU, GLUKE, EPSU, BACTU, WBCU, RBCU, CASTS, UCRY      Medications Reviewed:     Current Facility-Administered Medications   Medication Dose Route Frequency   ??? Vancomycin- Pharmacy to Dose   Other Rx Dosing/Monitoring   ??? metroNIDAZOLE (FLAGYL) IVPB premix 500 mg  500 mg IntraVENous Q12H   ??? vancomycin (VANCOCIN) 1,000 mg in 0.9% sodium chloride 250 mL (VIAL-MATE)  1,000 mg IntraVENous Q18H   ??? [START ON 12/03/2019] cefepime (MAXIPIME) 2 g in 0.9% sodium chloride (MBP/ADV) 100 mL MBP  2 g IntraVENous Q12H   ??? sodium chloride (NS) flush 5-40 mL  5-40 mL IntraVENous Q8H   ??? sodium chloride (NS) flush 5-40 mL  5-40 mL IntraVENous PRN   ??? sodium phosphate (FLEET'S) enema 1 Enema  1 Enema Rectal PRN   ??? oxyCODONE IR (ROXICODONE) tablet 5 mg  5 mg Oral Q4H PRN   ??? insulin glargine (LANTUS) injection 16 Units  16 Units SubCUTAneous QHS   ??? gabapentin (NEURONTIN) capsule 300 mg  300 mg Oral QHS   ??? losartan (COZAAR) tablet 100 mg  100 mg Oral DAILY   ??? docusate sodium (COLACE) capsule 100 mg  100 mg Oral BID   ??? senna (SENOKOT) tablet 8.6 mg  1 Tab Oral DAILY   ??? polyethylene glycol (MIRALAX) packet 17 g  17 g Oral DAILY PRN   ??? bisacodyL  (DULCOLAX) suppository 10 mg  10 mg Rectal DAILY PRN   ??? hydroCHLOROthiazide (HYDRODIURIL) tablet 25 mg  25 mg Oral DAILY   ??? sodium chloride (NS) flush 5-40 mL  5-40 mL IntraVENous Q8H   ??? sodium chloride (NS) flush 5-40 mL  5-40 mL IntraVENous PRN   ??? acetaminophen (TYLENOL) tablet 650 mg  650 mg Oral Q6H PRN    Or   ??? acetaminophen (TYLENOL) suppository 650 mg  650 mg Rectal Q6H PRN   ??? promethazine (PHENERGAN) tablet 12.5 mg  12.5 mg Oral Q6H PRN  Or   ??? ondansetron (ZOFRAN) injection 4 mg  4 mg IntraVENous Q6H PRN   ??? glucose chewable tablet 16 g  4 Tab Oral PRN   ??? dextrose (D50W) injection syrg 12.5-25 g  25-50 mL IntraVENous PRN   ??? glucagon (GLUCAGEN) injection 1 mg  1 mg IntraMUSCular PRN   ??? insulin lispro (HUMALOG) injection   SubCUTAneous AC&HS   ??? aspirin delayed-release tablet 81 mg  81 mg Oral DAILY   ??? pantoprazole (PROTONIX) tablet 40 mg  40 mg Oral ACB   ??? amLODIPine (NORVASC) tablet 10 mg  10 mg Oral DAILY   ??? enoxaparin (LOVENOX) injection 40 mg  40 mg SubCUTAneous Q24H     ______________________________________________________________________  EXPECTED LENGTH OF STAY: 4d 2h  ACTUAL LENGTH OF STAY:          [place], NP

## 2019-12-02 NOTE — Progress Notes (Signed)
Bedside and Verbal shift change report given to Tasha (oncoming nurse) by Kim (offgoing nurse). Report included the following information SBAR, Kardex and MAR.

## 2019-12-02 NOTE — Consults (Signed)
ID Consult Note  NAME:  Lindsey Huynh   DOB:   1951/03/19   MRN:   [id number]   Date/Time:  12/02/2019 11:35 AM  Subjective:   REASON FOR CONSULT:  Osteomyelitis of the left great toe    Lindsey Huynh is a 69 y.o. with a history of Diabetes, hyperlipidemia and hypertension.  She has been having left great toe pain for the past 2 months.  Over the past few weeks, it has become very severe.  The pain is nonradiating and there were no aggravating or alleviating factors.  The left great toe was somewhat swollen.  She did not have any fever or any chills.  Because of her symptoms, she came here to [facility].  She was diagnosed to have peripheral vascular disease.  She was brought to the operating room by Dr. Timmothy Sours. He performed a left mid superficial femoral artery to proximal peroneal artery bypass.  Podiatry was also consulted because there was some necrotic changes to the medial part of the second left toe as well as the dorsal portion of the Left great toe.  An MRI was done which revealed osteomyelitis of the distal phalanx of the left great toe.  She is currently on vancomycin and cefepime and we are being asked to see her in consult.      Past Medical History:   Diagnosis Date   ??? Diabetes ([place])    ??? GERD (gastroesophageal reflux disease)    ??? Hypercholesterolemia    ??? Hypertension       Past Surgical History:   Procedure Laterality Date   ??? HX COLONOSCOPY     ??? HX ORTHOPAEDIC  06-30-10    back surgery (tumor removed)   ??? 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 Smoking status: Former Smoker     Years: 10.00   ??? Smokeless tobacco: Never Used   Substance Use Topics   ??? Alcohol use: Yes     Alcohol/week: 5.0 standard drinks     Types: 6 Cans of beer per week      Family History     Allergies   Allergen Reactions   ??? Neuromuscular Blockers, Steroidal Other (comments)     GI Upset      Home Medications:  Prior to Admission Medications   Prescriptions Last Dose  Informant Patient Reported? Taking?   Insulin Needles, Disposable, (Nano Pen Needle) 32 gauge x 5/32" ndle   No No   Sig: Use with insulin pen 3 times daily   amLODIPine (NORVASC) 10 mg tablet   No No   Sig: Take 1 Tab by mouth daily.   aspirin delayed-release 81 mg tablet   Yes No   Sig: Take 81 mg by mouth daily.   cyclopentolate (CYCLOGYL) 1 % ophthalmic solution   Yes No   gabapentin (NEURONTIN) 300 mg capsule   No No   Sig: Take 1 Cap by mouth nightly. Max Daily Amount: 300 mg.   insulin detemir U-100 (Levemir FlexTouch U-100 Insuln) 100 unit/mL (3 mL) inpn   No No   Sig: 20 Units by SubCUTAneous route daily (with breakfast).   metFORMIN ER (GLUCOPHAGE XR) 500 mg tablet   No No   Sig: Take 2 Tabs by mouth daily (with dinner).   omeprazole (PRILOSEC) 20 mg capsule   Yes No   Sig: Take 20 mg by mouth daily.  valsartan-hydroCHLOROthiazide (DIOVAN-HCT) 160-25 mg per tablet   No No   Sig: Take 1 Tab by mouth daily.      Facility-Administered Medications: None     Hospital medications:  Current Facility-Administered Medications   Medication Dose Route Frequency   ??? piperacillin-tazobactam (ZOSYN) 3.375 g in 0.9% sodium chloride (MBP/ADV) 100 mL MBP  3.375 g IntraVENous Q8H   ??? Vancomycin- Pharmacy to Dose   Other Rx Dosing/Monitoring   ??? sodium chloride (NS) flush 5-40 mL  5-40 mL IntraVENous Q8H   ??? sodium chloride (NS) flush 5-40 mL  5-40 mL IntraVENous PRN   ??? sodium phosphate (FLEET'S) enema 1 Enema  1 Enema Rectal PRN   ??? oxyCODONE IR (ROXICODONE) tablet 5 mg  5 mg Oral Q4H PRN   ??? insulin glargine (LANTUS) injection 16 Units  16 Units SubCUTAneous QHS   ??? gabapentin (NEURONTIN) capsule 300 mg  300 mg Oral QHS   ??? losartan (COZAAR) tablet 100 mg  100 mg Oral DAILY   ??? docusate sodium (COLACE) capsule 100 mg  100 mg Oral BID   ??? senna (SENOKOT) tablet 8.6 mg  1 Tab Oral DAILY   ??? polyethylene glycol (MIRALAX) packet 17 g  17 g Oral DAILY PRN   ??? bisacodyL (DULCOLAX) suppository 10 mg  10 mg Rectal DAILY PRN   ???  hydroCHLOROthiazide (HYDRODIURIL) tablet 25 mg  25 mg Oral DAILY   ??? sodium chloride (NS) flush 5-40 mL  5-40 mL IntraVENous Q8H   ??? sodium chloride (NS) flush 5-40 mL  5-40 mL IntraVENous PRN   ??? acetaminophen (TYLENOL) tablet 650 mg  650 mg Oral Q6H PRN    Or   ??? acetaminophen (TYLENOL) suppository 650 mg  650 mg Rectal Q6H PRN   ??? promethazine (PHENERGAN) tablet 12.5 mg  12.5 mg Oral Q6H PRN    Or   ??? ondansetron (ZOFRAN) injection 4 mg  4 mg IntraVENous Q6H PRN   ??? glucose chewable tablet 16 g  4 Tab Oral PRN   ??? dextrose (D50W) injection syrg 12.5-25 g  25-50 mL IntraVENous PRN   ??? glucagon (GLUCAGEN) injection 1 mg  1 mg IntraMUSCular PRN   ??? insulin lispro (HUMALOG) injection   SubCUTAneous AC&HS   ??? aspirin delayed-release tablet 81 mg  81 mg Oral DAILY   ??? pantoprazole (PROTONIX) tablet 40 mg  40 mg Oral ACB   ??? amLODIPine (NORVASC) tablet 10 mg  10 mg Oral DAILY   ??? enoxaparin (LOVENOX) injection 40 mg  40 mg SubCUTAneous Q24H     REVIEW OF SYSTEMS:        Const:    negative weight loss  Eyes:   negative diplopia or visual changes, negative eye pain  ENT:   negative coryza, negative sore throat  Resp:   negative cough, hemoptysis, dyspnea  Cards:  negative for chest pain, palpitations  GU:  negative for frequency, dysuria and hematuria  GI:   Negative for hematemesis and hematochezia  Skin:   negative for rash and pruritus  Heme:   negative for easy bruising and gum/nose bleeding  MS:  negative for myalgias, arthralgias  Neurolo:  negative for headaches, dizziness, vertigo, memory problems   Psych:  negative for hallucinations        Objective:   VITALS:    Visit Vitals  BP 132/73 (BP Patient Position: At rest)   Pulse 81   Temp 98 ??F (36.7 ??C)   Resp 16   Ht 5'  5" (1.651 m)   Wt 76.9 kg (169 lb 8 oz)   SpO2 99%   BMI 28.21 kg/m??     Temp (24hrs), Avg:98 ??F (36.7 ??C), Min:97.3 ??F (36.3 ??C), Max:98.6 ??F (37 ??C)    PHYSICAL EXAM:   General:    Alert, cooperative, no distress, appears stated age.     Head:    Normocephalic, without obvious abnormality, atraumatic.  Eyes:   Conjunctivae clear, anicteric sclerae.    Nose:  Nares normal.   Throat:    Lips and tongue normal.  No Thrush  Neck:  Supple, symmetrical    no carotid bruit and no JVD.  GU:    No CVA tenderness, no foley catheter  Lungs:   Clear to auscultation bilaterally.  No Wheezing or Rhonchi. No rales.  Heart:   Regular rate and rhythm,  no murmur, rub or gallop.  Abdomen:   Soft, non-tender,not distended.  Bowel sounds normal.   Extremities: The left great toe has some necrosis on the nailbed.  The second left toe has some necrosis on the medial portion.  Skin:     No rashes or lesions.  Not Jaundiced  Lymph: Cervical normal  Neurologic: Full use of extraocular muscles, no facial asymmetry, tongue midline muscle strength 5 out of 5    LAB DATA REVIEWED:    Recent Results (from the past 48 hour(s))   GLUCOSE, POC    Collection Time: 11/30/19 11:41 AM   Result Value Ref Range    Glucose (POC) 160 (H) 65 - 100 mg/dL    Performed by HUNDON Kamala PCT    GLUCOSE, POC    Collection Time: 11/30/19  4:31 PM   Result Value Ref Range    Glucose (POC) 135 (H) 65 - 100 mg/dL    Performed by Dowell Kristin (CON)    GLUCOSE, POC    Collection Time: 11/30/19  8:38 PM   Result Value Ref Range    Glucose (POC) 131 (H) 65 - 100 mg/dL    Performed by LIU GUILAN    CBC WITH AUTOMATED DIFF    Collection Time: 12/01/19  5:54 AM   Result Value Ref Range    WBC 8.4 3.6 - 11.0 K/uL    RBC 2.70 (L) 3.80 - 5.20 M/uL    HGB 8.4 (L) 11.5 - 16.0 g/dL    HCT 24.6 (L) 35.0 - 47.0 %    MCV 91.1 80.0 - 99.0 FL    MCH 31.1 26.0 - 34.0 PG    MCHC 34.1 30.0 - 36.5 g/dL    RDW 13.1 11.5 - 14.5 %    PLATELET 306 150 - 400 K/uL    MPV 10.4 8.9 - 12.9 FL    NRBC 0.0 0 PER 100 WBC    ABSOLUTE NRBC 0.00 0.00 - 0.01 K/uL    NEUTROPHILS 73 32 - 75 %    LYMPHOCYTES 11 (L) 12 - 49 %    MONOCYTES 10 5 - 13 %    EOSINOPHILS 5 0 - 7 %    BASOPHILS 0 0 - 1 %    IMMATURE GRANULOCYTES 1 (H) 0.0 - 0.5 %    ABS.  NEUTROPHILS 6.2 1.8 - 8.0 K/UL    ABS. LYMPHOCYTES 0.9 0.8 - 3.5 K/UL    ABS. MONOCYTES 0.8 0.0 - 1.0 K/UL    ABS. EOSINOPHILS 0.4 0.0 - 0.4 K/UL    ABS. BASOPHILS 0.0 0.0 - 0.1 K/UL    ABS. IMM.   GRANS. 0.0 0.00 - 0.04 K/UL    DF AUTOMATED     METABOLIC PANEL, BASIC    Collection Time: 12/01/19  5:54 AM   Result Value Ref Range    Sodium 135 (L) 136 - 145 mmol/L    Potassium 3.2 (L) 3.5 - 5.1 mmol/L    Chloride 101 97 - 108 mmol/L    CO2 24 21 - 32 mmol/L    Anion gap 10 5 - 15 mmol/L    Glucose 126 (H) 65 - 100 mg/dL    BUN 16 6 - 20 MG/DL    Creatinine 0.93 0.55 - 1.02 MG/DL    BUN/Creatinine ratio 17 12 - 20      GFR est AA >60 >60 ml/min/1.33m2    GFR est non-AA 60 (L) >60 ml/min/1.33m2    Calcium 10.1 8.5 - 10.1 MG/DL   GLUCOSE, POC    Collection Time: 12/01/19  6:14 AM   Result Value Ref Range    Glucose (POC) 147 (H) 65 - 100 mg/dL    Performed by [place], POC    Collection Time: 12/01/19 11:09 AM   Result Value Ref Range    Glucose (POC) 173 (H) 65 - 100 mg/dL    Performed by Drue Flirt PCT    GLUCOSE, POC    Collection Time: 12/01/19  5:00 PM   Result Value Ref Range    Glucose (POC) 170 (H) 65 - 100 mg/dL    Performed by Crist Infante  PCT    GLUCOSE, POC    Collection Time: 12/01/19  9:05 PM   Result Value Ref Range    Glucose (POC) 171 (H) 65 - 100 mg/dL    Performed by Quin Hoop    CBC WITH AUTOMATED DIFF    Collection Time: 12/02/19  5:40 AM   Result Value Ref Range    WBC 6.7 3.6 - 11.0 K/uL    RBC 2.72 (L) 3.80 - 5.20 M/uL    HGB 8.4 (L) 11.5 - 16.0 g/dL    HCT 24.8 (L) 35.0 - 47.0 %    MCV 91.2 80.0 - 99.0 FL    MCH 30.9 26.0 - 34.0 PG    MCHC 33.9 30.0 - 36.5 g/dL    RDW 12.9 11.5 - 14.5 %    PLATELET 306 150 - 400 K/uL    MPV 9.8 8.9 - 12.9 FL    NRBC 0.0 0 PER 100 WBC    ABSOLUTE NRBC 0.00 0.00 - 0.01 K/uL    NEUTROPHILS 64 32 - 75 %    LYMPHOCYTES 14 12 - 49 %    MONOCYTES 11 5 - 13 %    EOSINOPHILS 10 (H) 0 - 7 %    BASOPHILS 1 0 - 1 %    IMMATURE GRANULOCYTES 0 0.0 - 0.5 %     ABS. NEUTROPHILS 4.2 1.8 - 8.0 K/UL    ABS. LYMPHOCYTES 1.0 0.8 - 3.5 K/UL    ABS. MONOCYTES 0.8 0.0 - 1.0 K/UL    ABS. EOSINOPHILS 0.7 (H) 0.0 - 0.4 K/UL    ABS. BASOPHILS 0.0 0.0 - 0.1 K/UL    ABS. IMM. GRANS. 0.0 0.00 - 0.04 K/UL    DF AUTOMATED     METABOLIC PANEL, BASIC    Collection Time: 12/02/19  5:40 AM   Result Value Ref Range    Sodium 134 (L) 136 - 145 mmol/L    Potassium 3.6 3.5 - 5.1 mmol/L  Chloride 102 97 - 108 mmol/L    CO2 23 21 - 32 mmol/L    Anion gap 9 5 - 15 mmol/L    Glucose 108 (H) 65 - 100 mg/dL    BUN 18 6 - 20 MG/DL    Creatinine 0.98 0.55 - 1.02 MG/DL    BUN/Creatinine ratio 18 12 - 20      GFR est AA >60 >60 ml/min/1.72m2    GFR est non-AA 56 (L) >60 ml/min/1.1m2    Calcium 9.6 8.5 - 10.1 MG/DL   GLUCOSE, POC    Collection Time: 12/02/19  6:13 AM   Result Value Ref Range    Glucose (POC) 128 (H) 65 - 100 mg/dL    Performed by Quin Hoop    GLUCOSE, POC    Collection Time: 12/02/19 11:23 AM   Result Value Ref Range    Glucose (POC) 207 (H) 65 - 100 mg/dL    Performed by Sandi Raveling          IMPRESSION    #1 diabetic left foot infection with osteomyelitis of the left great toe    #2 hyperlipidemia    #3 hypertension    #4 diabetes      PLAN    I discussed with the patient that she has bone infection in the left great toe.  I explained to her that there could be 2 options either an amputation of at least the distal part of the left great toe or just treatment with antibiotics.  There is a significant failure rate with the latter I explained.  She will need podiatry follow-up.  We should inform Dr. Katrinka Blazing about the findings.  I will change Zosyn to cefepime and Flagyl.  We should continue vancomycin.                   ___________________________________________________  ID: Blair Hailey, MD

## 2019-12-02 NOTE — Progress Notes (Unsigned)
Progress Notes by Raynelle Highland, NP at 12/02/19 1300                Author: Raynelle Highland, NP  Service: Nurse Practitioner  Author Type: Acute Care Nurse Practitioner       Filed: 12/02/19 2036  Date of Service: 12/02/19 1300  Status: Cosign Needed           Editor: Raynelle Highland, NP (Acute Care Nurse Practitioner)  Cosign Required: Yes                    [facility]                                                                                              Hospitalist Progress Note   Raynelle Highland, NP   Answering service: [phone] OR [phone] from in house phone            Date of Service:  12/02/2019   NAME:  Lindsey Huynh   DOB:  [birth year]   MRN:  [id number]           Admission Summary:        From H&P 11/23/2019:   "Wally Port??is a 69 y.o.??female??with past medical history of diabetes, insulin-dependent, GERD and hypertension comes for return of left foot pain and swelling.   Patient is awake, alert and oriented able to answer my question follow my request, data also obtained in the ED staff and extensive chart review. ??As per collective reports, patient??left  great toe pain which associated darkening of the skin on the distal end of the toe and swelling of the left foot the medial aspect of the Of the last 1 week and has been progressively getting worse. ??The only thing that he remembers  was fungal nail cream. ??She reported since her left second toe is in contact with the first toe and that pain he also became slightly cellulitic, she also has some dorsal swelling of the foot as well. ??This all progress from being mildly achy to  significant tender and swollen last 1 week and she came today because the pain was unbearable, 10/10, getting from her toe to her foot and made her ambulation significantly difficult. ??Although she continues to have pain at rest. ??She has not seek  medical help for that until today, did not remember any trauma or injury  to her feet. ??She is hemodynamically otherwise stable in the ED on her own. ??Patient was hemodynamically stable in the ED, and work revealed hypokalemia leukocytosis and x-ray  the foot was without any acute abnormality. ??Given concerns for dry gangrene and cellulitis, she was started on IV vancomycin. Patient has no other medical complaints. ??Patient will be admitted to hospitalist??service for evaluation management."      Interval history / Subjective:             F/U for left foot cellulitis, left great toe gangrene, DMII, HTN, GERD, Hypokalemia, left popliteal occlusion.   Patient seen and examined sitting up in bed. States that she is doing good  and wants to go home.    Assisted patient out of bed to bedside chair. Generalized weakness.    S/p fem- pop bypass of left leg (03/11)- Four surgical incisions noted to left leg with staples that are intact. Incisions are clean and dry. No signs of infection noted. Pain controlled.    Discussed MRI results and ID consultation to assist with antibiotics.    No overnight events noted. No new complaints.      Assessment & Plan:          Left great toe Osteomyelitis    - MRI LLE: 1. First distal phalanx osteomyelitis.   2. No abscess.   - IV antibiotics   - Consult ID for antibiotic recommendations    - Podiatry following       LLE pain:??dry gangrene??of??left 2nd toe cellulitic changes/Left popliteal occlusion   -s/p left fem-pop bypass (03/11)   -CT LLE (03/06) NL   -Elevated Sed rate and CRP   -BC (03/06) NGTD    -Pain control   - Podiatry??following    - MRI completed      Anemia    Stable, No sign of bleeding noted.    - hgb 8.4.    - Monitor labs and transfuse if hgb <7.0    ??   DMII:??hold metformin while IP   -Cont basal insulin, SSI   -Diabetic diet   -a1c 8.1   - DM following??   ??   HTN   -Controlled   -Cont home meds    ??   GERD   -Cont PPI   ??   Hypokalemia   -potassium 3.2   - Replaced    - Monitor and replace as needed.    ??   Code status: Full   DVT prophylaxis:  Lovenox      Care Plan discussed with: Patient/Family, Nurse and Case Manager   Anticipated Disposition: Home w/Family   Anticipated Discharge: 24 hours to 48 hours           Hospital Problems   Date Reviewed:  05/31/2018                         Codes  Class  Noted  POA              Gangrene (HCC)  ICD-10-CM: R60   ICD-9-CM: 785.4    11/23/2019  Unknown                               Review of Systems:     A comprehensive review of systems was negative except for that written in the HPI.            Vital Signs:      Last 24hrs VS reviewed since prior progress note. Most recent are:   Visit Vitals      BP  132/79 (BP 1 Location: Left upper arm)     Pulse  74     Temp  98.3 ??F (36.8 ??C)     Resp  16     Ht  5\' 5"  (1.651 m)     Wt  76.9 kg (169 lb 8 oz)     SpO2  99%        BMI  28.21 kg/m??              Intake/Output Summary (Last 24 hours) at 12/02/2019 2023  Last data filed at 12/02/2019 0507     Gross per 24 hour        Intake  --        Output  350 ml        Net  -350 ml              Physical Examination:                     Constitutional:   No acute distress, cooperative, pleasant, answers questions      ENT:   Oral mucosa moist, oropharynx benign.      Resp:   CTA bilaterally. No wheezing/rhonchi/rales. No accessory muscle use     CV:   Regular rhythm, normal rate, no murmurs, gallops, rubs      GI:   Soft, non distended, non tender. normoactive bowel sounds      Musculoskeletal:   No edema, warm, 2+ pulses throughout         Neurologic:   Moves all extremities. Follows commands.  AAOx3, CN II-XII reviewed       Psych:  Good insight, Not anxious nor agitated.    Skin:  Good turgor, no rashes or ulcers and Four left leg surgical incisions noted, staples intact. No signs of infection noted               Data Review:      Review and/or order of clinical lab test   Review and/or order of tests in the radiology section of CPT   Review and/or order of tests in the medicine section of CPT           Labs:          Recent  Labs            12/02/19   0540  12/01/19   0554     WBC  6.7  8.4     HGB  8.4*  8.4*     HCT  24.8*  24.6*         PLT  306  306          Recent Labs             12/02/19   0540  12/01/19   0554  11/30/19   0223     NA  134*  135*  133*     K  3.6  3.2*  3.3*     CL  102  101  100     CO2  23  24  24      BUN  18  16  11      CREA  0.98  0.93  0.81     GLU  108*  126*  120*          CA  9.6  10.1  9.5        No results for input(s): ALT, AP, TBIL, TBILI, TP, ALB, GLOB, GGT, AML, LPSE in the last 72 hours.      No lab exists for component: SGOT, GPT, AMYP, HLPSE   No results for input(s): INR, PTP, APTT, INREXT in the last 72 hours.    No results for input(s): FE, TIBC, PSAT, FERR in the last 72 hours.    No results found for: FOL, RBCF    No results for input(s): PH, PCO2, PO2 in the last 72 hours.   No results for input(s): CPK, CKNDX, TROIQ in the last 72  hours.      No lab exists for component: CPKMB     Lab Results         Component  Value  Date/Time            Cholesterol, total  219 (H)  10/30/2018 10:27 AM       HDL Cholesterol  63  10/30/2018 10:27 AM       LDL, calculated  130 (H)  30/86/5784 10:27 AM       Triglyceride  131  10/30/2018 10:27 AM            CHOL/HDL Ratio  3.4  12/26/2007 09:12 AM          Lab Results         Component  Value  Date/Time            Glucose (POC)  151 (H)  12/02/2019 04:09 PM       Glucose (POC)  207 (H)  12/02/2019 11:23 AM            Glucose (POC)  128 (H)  12/02/2019 06:13 AM            Glucose (POC)  171 (H)  12/01/2019 09:05 PM            Glucose (POC)  170 (H)  12/01/2019 05:00 PM        No results found for: COLOR, APPRN, SPGRU, REFSG, PHU, PROTU, GLUCU, KETU, BILU, UROU, NITU, LEUKU, GLUKE, EPSU, BACTU, WBCU, RBCU, CASTS, UCRY           Medications Reviewed:          Current Facility-Administered Medications          Medication  Dose  Route  Frequency           ?  Vancomycin- Pharmacy to Dose     Other  Rx Dosing/Monitoring     ?  metroNIDAZOLE (FLAGYL) IVPB premix 500  mg   500 mg  IntraVENous  Q12H     ?  vancomycin (VANCOCIN) 1,000 mg in 0.9% sodium chloride 250 mL (VIAL-MATE)   1,000 mg  IntraVENous  Q18H     ?  [START ON 12/03/2019] cefepime (MAXIPIME) 2 g in 0.9% sodium chloride (MBP/ADV) 100 mL MBP   2 g  IntraVENous  Q12H     ?  sodium chloride (NS) flush 5-40 mL   5-40 mL  IntraVENous  Q8H     ?  sodium chloride (NS) flush 5-40 mL   5-40 mL  IntraVENous  PRN     ?  sodium phosphate (FLEET'S) enema 1 Enema   1 Enema  Rectal  PRN     ?  oxyCODONE IR (ROXICODONE) tablet 5 mg   5 mg  Oral  Q4H PRN     ?  insulin glargine (LANTUS) injection 16 Units   16 Units  SubCUTAneous  QHS     ?  gabapentin (NEURONTIN) capsule 300 mg   300 mg  Oral  QHS     ?  losartan (COZAAR) tablet 100 mg   100 mg  Oral  DAILY     ?  docusate sodium (COLACE) capsule 100 mg   100 mg  Oral  BID     ?  senna (SENOKOT) tablet 8.6 mg   1 Tab  Oral  DAILY     ?  polyethylene glycol (MIRALAX) packet 17 g   17 g  Oral  DAILY PRN           ?  bisacodyL (DULCOLAX) suppository 10 mg   10 mg  Rectal  DAILY PRN           ?  hydroCHLOROthiazide (HYDRODIURIL) tablet 25 mg   25 mg  Oral  DAILY     ?  sodium chloride (NS) flush 5-40 mL   5-40 mL  IntraVENous  Q8H     ?  sodium chloride (NS) flush 5-40 mL   5-40 mL  IntraVENous  PRN     ?  acetaminophen (TYLENOL) tablet 650 mg   650 mg  Oral  Q6H PRN          Or           ?  acetaminophen (TYLENOL) suppository 650 mg   650 mg  Rectal  Q6H PRN     ?  promethazine (PHENERGAN) tablet 12.5 mg   12.5 mg  Oral  Q6H PRN          Or           ?  ondansetron (ZOFRAN) injection 4 mg   4 mg  IntraVENous  Q6H PRN     ?  glucose chewable tablet 16 g   4 Tab  Oral  PRN     ?  dextrose (D50W) injection syrg 12.5-25 g   25-50 mL  IntraVENous  PRN     ?  glucagon (GLUCAGEN) injection 1 mg   1 mg  IntraMUSCular  PRN     ?  insulin lispro (HUMALOG) injection     SubCUTAneous  AC&HS     ?  aspirin delayed-release tablet 81 mg   81 mg  Oral  DAILY     ?  pantoprazole (PROTONIX) tablet 40 mg    40 mg  Oral  ACB     ?  amLODIPine (NORVASC) tablet 10 mg   10 mg  Oral  DAILY           ?  enoxaparin (LOVENOX) injection 40 mg   40 mg  SubCUTAneous  Q24H        ______________________________________________________________________   EXPECTED LENGTH OF STAY: 4d 2h   ACTUAL LENGTH OF STAY:          9                    Raynelle Highland, NP

## 2019-12-02 NOTE — Progress Notes (Signed)
 Problem: Mobility Impaired (Adult and Pediatric)  Goal: *Acute Goals and Plan of Care (Insert Text)  Description: FUNCTIONAL STATUS PRIOR TO ADMISSION: Patient was independent and active without use of DME.    HOME SUPPORT PRIOR TO ADMISSION: The patient lived alone with no local support.    Physical Therapy  Reassessed post-surgery 11/29/2019  1.  Patient will move from supine to sit and sit to supine , scoot up and down, and roll side to side in bed with supervision/set-up within 7 day(s).    2.  Patient will transfer from bed to chair and chair to bed with supervision/set-up using the least restrictive device within 7 day(s).  3.  Patient will perform sit to stand with supervision/set-up within 7 day(s).  4.  Patient will ambulate with supervision/set-up for 50 feet with the least restrictive device within 7 day(s).   5.  Patient will ascend/descend 4 stairs with cane and one handrail(s) with supervision/set-up within 7 day(s).   Goals remain appropriate and will continue to work toward same.      Physical Therapy Goals  Initiated 11/26/2019  1.  Patient will move from supine to sit and sit to supine , scoot up and down, and roll side to side in bed with supervision/set-up within 7 day(s).    2.  Patient will transfer from bed to chair and chair to bed with supervision/set-up using the least restrictive device within 7 day(s).  3.  Patient will perform sit to stand with supervision/set-up within 7 day(s).  4.  Patient will ambulate with supervision/set-up for 50 feet with the least restrictive device within 7 day(s).   5.  Patient will ascend/descend 4 stairs with cane and one handrail(s) with supervision/set-up within 7 day(s).     Outcome: Progressing Towards Goal  PHYSICAL THERAPY TREATMENT  Patient: Lindsey Huynh (69 y.o. female)  Date: 12/02/2019  Diagnosis: Gangrene (HCC) [I96] <principal problem not specified>  Procedure(s) (LRB):  LEFT FEMORAL-TIBIAL BYPASS WITH VEIN (Left) 4 Days Post-Op  Precautions:  Fall, WBAT  Chart, physical therapy assessment, plan of care and goals were reviewed.    ASSESSMENT  Patient continues with skilled PT services and is progressing towards goals. She had no verbal c/o pain this session. Physician present prior to PT for dressing change. Noted order in chart for post-op shoe and one provided. Instructed pt to wear when OOB and performing transfers/mobility. Today, she was able to complete transfers and short gait w/ Min Ax1 overall. Amb approx 8 FT total w/RW, demonstrating PWB on left. VC for hand placement on RW, upright posture as she was in flexed position. VC for BUE support/WBing through RW when advancing R foot to offload Left. Also required VC for hand placement w/ sit<>stand transfers to RW (pt pulling on RW). Also required safety cues for hand placement and eccentric control when sitting (pt plopped w/ hands still on RW). She declined chair at this time and preferred to get back to bed; reports she had been sitting up in chair.     Pt would like to go home, however does live alone and has STE. Discussed w/ pt that she would need to be able to perform stairs safely if she wants to d/c home. Will tentatively plan for stair training as pt's mobility progresses. Pending her mobility progress w/ acute PT, recommend IPR vs HHPT w/ assist/support.     Current Level of Function Impacting Discharge (mobility/balance): Min Ax1 overall for functional transfers and short gait w/ RW.  Other factors to consider for discharge: lives alone w/ no local support. WBAT on Left, currently demonstrating PWB w/Post-op shoe and RW support w/ Assist x1         PLAN :  Patient continues to benefit from skilled intervention to address the above impairments.  Continue treatment per established plan of care.  to address goals.    Recommendation for discharge: (in order for the patient to meet his/her long term goals)  To be determined: Rehab (IPR) vs HHPT and family assist/support for all mobility,  transfers and I/ADL's pending pt's mobility progress. If pt able to progress gait to household distance as well as complete and clear stair training, she may be able to d/c home w/ assist/support.  If not, then recommend IP Rehab.     This discharge recommendation:  Has been made in collaboration with the attending provider and/or case management    IF patient discharges home will need the following DME: rolling walker and wheelchair (for long distances)       SUBJECTIVE:   Patient stated "Oo,my shoe looks so pretty. Can I leave it on so it looks like I did something? instructed pt she can take post-op shoe off when in bed, but to wear once OOB and w/ mobiltiy.     OBJECTIVE DATA SUMMARY:   Critical Behavior:  Neurologic State: Alert, Appropriate for age  Orientation Level: Oriented X4  Cognition: Appropriate decision making, Appropriate for age attention/concentration, Appropriate safety awareness, Follows commands     Functional Mobility Training:  Bed Mobility:     Supine to Sit: Minimum assistance;Assist x1  Sit to Supine: Minimum assistance;Assist x1  Scooting: Stand-by assistance;Contact guard assistance;Assist x1(SBA-pt able to scoot to [place])        Transfers:  Sit to Stand: Minimum assistance;Additional time;Assist x1(+ VC for hand placement)  Stand to Sit: Minimum assistance;Assist x1(VC for hand placement when sitting (pt plopped))                             Balance:  Sitting: Intact  Sitting - Static: Good (unsupported)  Sitting - Dynamic: Good (unsupported)  Ambulation/Gait Training:  Distance (ft): 8 Feet (ft)  Assistive Device: Gait belt;Walker, rolling  Ambulation - Level of Assistance: Minimal assistance;Additional time;Assist x1        Gait Abnormalities: Antalgic;Decreased step clearance;Step to gait     Left Side Weight Bearing: As tolerated(now has post-op shoe)  Base of Support: Narrowed;Shift to left  Stance: Left decreased  Speed/Cadence: Slow  Step Length: Left shortened;Right  shortened  Swing Pattern: Left asymmetrical     Interventions: Safety awareness training;Verbal cues    Pain Rating:  No verbal c/o pain    Activity Tolerance:   Good    After treatment patient left in no apparent distress:   Supine in bed, Heels elevated for pressure relief, Call bell within reach, and Side rails x 3    COMMUNICATION/COLLABORATION:   The patient's plan of care was discussed with: Registered nurse.     Miranda A Means,PTA   Time Calculation: 34 mins

## 2019-12-02 NOTE — Progress Notes (Signed)
Clinical Pharmacy Note: Metronidazole Dosing    Please note that the metronidazole dose for Lindsey Huynh has been changed to 500 mg  q12h per [facility]-approved protocol.  Please contact the pharmacy with any questions.    Lajoyce Corners, [facility]

## 2019-12-02 NOTE — Progress Notes (Signed)
Day #1 of Cefepime  Indication:  Osteo of toe  Current regimen:  2 g IV every 8 hours  Abx regimen: Vanc + Flagyl + cefepime  Recent Labs     12/02/19  0540 12/01/19  0554 11/30/19  0223   WBC 6.7 8.4 9.7   CREA 0.98 0.93 0.81   BUN 18 16 11      Est CrCl: 54.8 ml/min; UO: - ml/kg/hr  Temp (24hrs), Avg:98.1 F (36.7 C), Min:97.3 F (36.3 C), Max:98.6 F (37 C)    Cultures:   3/6 Blood: NG - Final    Plan: Change to cefepime 2 g IV every 12 hours per [facility] P&T Committee Protocol with respect to renal function.  Pharmacy will continue to monitor patient daily and will make dosage adjustments based upon changing renal function.

## 2019-12-02 NOTE — Progress Notes (Signed)
TOC: IPR vs SNF. TBD. Awaiting I/D plan for antibiotic infusions. Transport TBD.  CM following.    Provided patient SNF/IPR list choice. Provided education on differences between SNF/IPR's.    RUR: 15%    Estill Batten RN/CRM

## 2019-12-02 NOTE — Progress Notes (Signed)
Vascular:    No complaints, incisions OK    Doing well POD #4 after left leg bypass - can go home when ambulatory or to rehab anytime from my standpoint.

## 2019-12-02 NOTE — Progress Notes (Signed)
 Pharmacist Note - Vancomycin Dosing  Therapy day 1 (re-start; total day 10)  Indication: Osteo of 1st distal phalanx; SSTI- Lt great toe pain/swelling w/dry gangrene, Lt 2nd toe cellulitis   Current regimen: 1000 mg IV every 16 hours- most recent dose on 3/13 @ 17:33    Recent Labs     12/02/19  0540 12/01/19  0554 11/30/19  0223   WBC 6.7 8.4 9.7   CREA 0.98 0.93 0.81   BUN 18 16 11        A Random Level resulted at 5.9 mcg/mL which was obtained ~41.5 hrs post-dose.  The extrapolated true trough is approximately 19.7 mcg/mL based on the patient's known kinetics.       Goal trough: 15 - 20 mcg/mL  (updated for indication of osteo)   Plan: Will initiate vancomycin 1000 mg IV every 18 hours for predicted trough of 16.14 mcg/mL. Pharmacy will continue to monitor this patient daily for changes in clinical status and renal function.

## 2019-12-02 NOTE — Progress Notes (Signed)
Nurse Guilan Liu gave bedside report to oncoming nurse Kim RN by SBAR and Kardex

## 2019-12-02 NOTE — Progress Notes (Signed)
Progress Notes by [name], DPM at 12/02/19 1209                Author: [name], DPM  Service: Podiatry  Author Type: Physician       Filed: 12/02/19 1213  Date of Service: 12/02/19 1209  Status: Signed          Editor: [name], [place] (Physician)                                                                     Francoise Schaumann, DPM - Despina Pole. 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 Pt is s/p fem pop bypass (vascular surgery following),  diabetes with ulcer, peripheral vascular ds      Discussed with vascular surgery      Pt evaluated and tx. Dressing consisting of betadine/dsd applied, recommend HHC with betadine and dsd daily to left 1, 2 toes      Discussed risks of toe amputation, biopsy, such as need for further amputation/slow wound healing and benefits such as treatment of ulceration, osteomyelitis. Patient electing for conservative care with local wound care and IV antibiotic treatment (per  ID recommendations)       Pt can f/[facility] within one week of discharge (Wednesday afternoons)       Do not hesitate to contact us via [employer] or phone (personal [phone]) with any questions.      Subjective:   Pt complains of wound to left 1, 2 toes.  Previous tx include none.  negative for fever, chills, nausea, vomiting, chest pain, shortness of breath.        HPI: relates improved pain today      ROS:   Consitutional: no weight loss, night sweats, fatigue / malaise / lethargy.   Musculoskeletal: no joint / extremity pain, misalignment, stiffness, decreased ROM, crepitus.   Integument: No pruritis, rashes, lesions, left  toe wounds.   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  Neuromuscular Blockers, Steroidal  Other (comments)             GI Upset        History reviewed. No pertinent family history.    @HXMEDICAL @     Past Surgical History:  Procedure  Laterality  Date          ?  HX COLONOSCOPY         ?  HX ORTHOPAEDIC    06-30-10          back surgery (tumor removed)          ?  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  Smoking status:  Former Smoker              Years:  10.00         ?  Smokeless tobacco:  Never Used       Substance Use Topics         ?  Alcohol use:  Yes              Alcohol/week:  5.0 standard drinks              Types:  6 Cans of beer per week            Social History          Substance and Sexual Activity        Alcohol Use  Yes         ?  Alcohol/week:  5.0 standard drinks         ?  Types:  6 Cans of beer per week          Social History          Substance and Sexual Activity        Drug Use  No           Social History          Tobacco Use        Smoking Status  Former Smoker         ?  Years:  10.00        Smokeless Tobacco  Never Used          Current Facility-Administered Medications          Medication  Dose  Route  Frequency           ?  piperacillin-tazobactam (ZOSYN) 3.375 g in 0.9% sodium chloride (MBP/ADV) 100 mL MBP   3.375 g  IntraVENous  Q8H     ?  Vancomycin- Pharmacy to Dose     Other  Rx Dosing/Monitoring     ?  sodium chloride (NS) flush 5-40 mL   5-40 mL  IntraVENous  Q8H     ?  sodium chloride (NS) flush 5-40 mL   5-40 mL  IntraVENous  PRN     ?  sodium phosphate (FLEET'S) enema 1 Enema   1 Enema  Rectal  PRN           ?  oxyCODONE IR (ROXICODONE) tablet 5 mg   5 mg  Oral  Q4H PRN           ?  insulin glargine (LANTUS) injection 16 Units   16 Units  SubCUTAneous  QHS     ?  gabapentin (NEURONTIN) capsule 300 mg   300  mg  Oral  QHS     ?  losartan (COZAAR) tablet 100 mg   100 mg  Oral  DAILY     ?  docusate sodium (COLACE) capsule 100 mg   100 mg  Oral  BID     ?  senna (SENOKOT) tablet 8.6 mg   1 Tab  Oral  DAILY     ?  polyethylene glycol (MIRALAX) packet 17 g   17 g  Oral  DAILY PRN     ?  bisacodyL (DULCOLAX) suppository 10 mg   10 mg  Rectal  DAILY PRN     ?  hydroCHLOROthiazide (HYDRODIURIL) tablet 25 mg   25 mg  Oral  DAILY     ?  sodium chloride (NS) flush 5-40 mL   5-40 mL  IntraVENous  Q8H     ?  sodium chloride (NS) flush 5-40 mL   5-40 mL  IntraVENous  PRN     ?  acetaminophen (TYLENOL) tablet 650 mg   650 mg  Oral  Q6H PRN          Or           ?  acetaminophen (TYLENOL) suppository 650 mg   650 mg  Rectal  Q6H PRN     ?  promethazine (PHENERGAN) tablet 12.5 mg   12.5 mg  Oral  Q6H PRN          Or           ?  ondansetron (ZOFRAN) injection 4 mg   4 mg  IntraVENous  Q6H PRN     ?  glucose chewable tablet 16 g   4 Tab  Oral  PRN     ?  dextrose (D50W) injection syrg 12.5-25 g   25-50 mL  IntraVENous  PRN     ?  glucagon (GLUCAGEN) injection 1 mg   1 mg  IntraMUSCular  PRN     ?  insulin lispro (HUMALOG) injection     SubCUTAneous  AC&HS     ?  aspirin delayed-release tablet 81 mg   81 mg  Oral  DAILY           ?  pantoprazole (PROTONIX) tablet 40 mg   40 mg  Oral  ACB           ?  amLODIPine (NORVASC) tablet 10 mg   10 mg  Oral  DAILY           ?  enoxaparin (LOVENOX) injection 40 mg   40 mg  SubCUTAneous  Q24H            Objective:   Vitals:    Patient Vitals for the past 12 hrs:            BP  Temp  Pulse  Resp  SpO2            12/02/19 0835  132/73  98 ??F (36.7 ??C)  81  16  99 %            12/02/19 0241  (!) 150/81  97.3 ??F (36.3 ??C)  76  15  100 %           Vascular:   B/L LE   DP 0/4; PT 0/4   capillary fill time brisk, pitting edema is present, skin temperature is cool, varicosities are present.      Dermatological:   Nails are thickened, discolored, painful to palpation, 52mm thick, with subungual debris.               There is no maceration of the interspaces  of the feet b/l.  No focal hyperkeratosis      Wound: 1, 2    Location: left distal hallux and 2nd toe   Margins: flush, demarcating   Drainage: none   Odor: none   Wound base: necrotic, dry   Lymphangitic streaking? No.   Undermining? No.   Sinus tracts? No.   Exposed bone? No.   Subcutaneous crepitation on palpation? No.      Neurological:   DTR are present, protective sensation per 5.07 Semmes Weinstein monofilament is absent, patient is AAOx3, mood is normal. Epicritic sensation is intact.      Orthopedic:   B/L LE are symmetric, ROM of ankle, STJ, 1st MTPJ is limited, MMT 5 out of 5 for B/L LE.  No pedal amputations noted      Constitutional: Pt is a well developed elderly female.            Imaging:   CT scan negative for osteomyelitis   Labs:     Recent Labs           12/02/19   0540     WBC  6.7     CREA  0.98     BUN  18     HGB  8.4*     HCT  24.8*     NA  134*     K  3.6     CL  102     CO2  23        GLU  108*

## 2019-12-02 NOTE — Progress Notes (Addendum)
TOC: IPR vs SNF. TBD. Awaiting I/D plan for antibiotic infusions. Transport TBD.  CM following.    Provided patient SNF/IPR list choice. Provided education on differences between SNF/IPR's.    RUR: 15%    Nathanial Millman RN/CRM

## 2019-12-02 NOTE — Progress Notes (Signed)
Pharmacist Note - Vancomycin Dosing  Therapy day 1 (re-start; total day 10)  Indication: Osteo of 1st distal phalanx; SSTI- Lt great toe pain/swelling w/dry gangrene, Lt 2nd toe cellulitis   Current regimen: 1000 mg IV every 16 hours- most recent dose on 3/13 @ 17:33    Recent Labs     12/02/19  0540 12/01/19  0554 11/30/19  0223   WBC 6.7 8.4 9.7   CREA 0.98 0.93 0.81   BUN 18 16 11        A Random Level resulted at 5.9 mcg/mL which was obtained ~41.5 hrs post-dose.  The extrapolated "true" trough is approximately 19.7 mcg/mL based on the patient's known kinetics.       Goal trough: 15 - 20 mcg/mL  (updated for indication of osteo)   Plan: Will initiate vancomycin 1000 mg IV every 18 hours for predicted trough of 16.14 mcg/mL. Pharmacy will continue to monitor this patient daily for changes in clinical status and renal function.

## 2019-12-02 NOTE — Progress Notes (Signed)
Clinical Pharmacy Note: Metronidazole Dosing    Please note that the metronidazole dose for Moni Whiley has been changed to 500 mg  q12h per [facility]-approved protocol.  Please contact the pharmacy with any questions.    Lajoyce Corners, [facility]

## 2019-12-02 NOTE — Progress Notes (Signed)
Day #1 of Cefepime  Indication:  Osteo of toe  Current regimen:  2 g IV every 8 hours  Abx regimen: Vanc + Flagyl + cefepime  Recent Labs     12/02/19  0540 12/01/19  0554 11/30/19  0223   WBC 6.7 8.4 9.7   CREA 0.98 0.93 0.81   BUN 18 16 11      Est CrCl: 54.8 ml/min; UO: - ml/kg/hr  Temp (24hrs), Avg:98.1 ??F (36.7 ??C), Min:97.3 ??F (36.3 ??C), Max:98.6 ??F (37 ??C)    Cultures:   3/6 Blood: NG - Final    Plan: Change to cefepime 2 g IV every 12 hours per [facility] P&T Committee Protocol with respect to renal function.  Pharmacy will continue to monitor patient daily and will make dosage adjustments based upon changing renal function.

## 2019-12-03 LAB — METABOLIC PANEL, BASIC
Anion gap: 10 mmol/L (ref 5–15)
BUN/Creatinine ratio: 15 (ref 12–20)
BUN: 14 MG/DL (ref 6–20)
CO2: 25 mmol/L (ref 21–32)
Calcium: 9.9 MG/DL (ref 8.5–10.1)
Chloride: 102 mmol/L (ref 97–108)
Creatinine: 0.94 MG/DL (ref 0.55–1.02)
GFR est AA: >60 mL/min/{1.73_m2} (ref >60)
GFR est non-AA: 59 mL/min/{1.73_m2} — ABNORMAL LOW (ref >60)
Glucose: 80 mg/dL (ref 65–100)
Potassium: 3.6 mmol/L (ref 3.5–5.1)
Sodium: 137 mmol/L (ref 136–145)

## 2019-12-03 LAB — CBC WITH AUTOMATED DIFF
ABS. BASOPHILS: 0 10*3/uL (ref 0.0–0.1)
ABS. EOSINOPHILS: 0.7 10*3/uL — ABNORMAL HIGH (ref 0.0–0.4)
ABS. IMM. GRANS.: 0 10*3/uL (ref 0.00–0.04)
ABS. LYMPHOCYTES: 1.2 10*3/uL (ref 0.8–3.5)
ABS. MONOCYTES: 0.8 10*3/uL (ref 0.0–1.0)
ABS. NEUTROPHILS: 4.2 10*3/uL (ref 1.8–8.0)
ABSOLUTE NRBC: 0 10*3/uL (ref 0.00–0.01)
BASOPHILS: 0 % (ref 0–1)
EOSINOPHILS: 11 % — ABNORMAL HIGH (ref 0–7)
HCT: 24.8 % — ABNORMAL LOW (ref 35.0–47.0)
HGB: 8.6 g/dL — ABNORMAL LOW (ref 11.5–16.0)
IMMATURE GRANULOCYTES: 0 % (ref 0.0–0.5)
LYMPHOCYTES: 18 % (ref 12–49)
MCH: 31.6 PG (ref 26.0–34.0)
MCHC: 34.7 g/dL (ref 30.0–36.5)
MCV: 91.2 FL (ref 80.0–99.0)
MONOCYTES: 11 % (ref 5–13)
MPV: 10.5 FL (ref 8.9–12.9)
NEUTROPHILS: 60 % (ref 32–75)
NRBC: 0 PER 100 WBC
PLATELET: 381 10*3/uL (ref 150–400)
RBC: 2.72 M/uL — ABNORMAL LOW (ref 3.80–5.20)
RDW: 12.9 % (ref 11.5–14.5)
WBC: 7 10*3/uL (ref 3.6–11.0)

## 2019-12-03 LAB — GLUCOSE, POC
Glucose (POC): 125 mg/dL — ABNORMAL HIGH (ref 65–100)
Glucose (POC): 90 mg/dL (ref 65–100)
Glucose (POC): 98 mg/dL (ref 65–100)
Glucose (POC): 163 mg/dL — ABNORMAL HIGH (ref 65–100)

## 2019-12-03 LAB — CBC WITH AUTO DIFFERENTIAL
Basophils %: 0 % (ref 0–1)
Basophils Absolute: 0 10*3/uL (ref 0.0–0.1)
Eosinophils %: 11 % — ABNORMAL HIGH (ref 0–7)
Eosinophils Absolute: 0.7 10*3/uL — ABNORMAL HIGH (ref 0.0–0.4)
Granulocyte Absolute Count: 0 10*3/uL (ref 0.00–0.04)
Hematocrit: 24.8 % — ABNORMAL LOW (ref 35.0–47.0)
Hemoglobin: 8.6 g/dL — ABNORMAL LOW (ref 11.5–16.0)
Immature Granulocytes %: 0 % (ref 0.0–0.5)
Lymphocytes %: 18 % (ref 12–49)
Lymphocytes Absolute: 1.2 10*3/uL (ref 0.8–3.5)
MCH: 31.6 PG (ref 26.0–34.0)
MCHC: 34.7 g/dL (ref 30.0–36.5)
MCV: 91.2 FL (ref 80.0–99.0)
MPV: 10.5 FL (ref 8.9–12.9)
Monocytes %: 11 % (ref 5–13)
Monocytes Absolute: 0.8 10*3/uL (ref 0.0–1.0)
NRBC Absolute: 0 10*3/uL (ref 0.00–0.01)
Neutrophils %: 60 % (ref 32–75)
Neutrophils Absolute: 4.2 10*3/uL (ref 1.8–8.0)
Nucleated RBCs: 0 PER 100 WBC
Platelets: 381 10*3/uL (ref 150–400)
RBC: 2.72 M/uL — ABNORMAL LOW (ref 3.80–5.20)
RDW: 12.9 % (ref 11.5–14.5)
WBC: 7 10*3/uL (ref 3.6–11.0)

## 2019-12-03 LAB — BASIC METABOLIC PANEL
Anion Gap: 10 mmol/L (ref 5–15)
BUN/Creatinine Ratio: 15 (ref 12–20)
BUN: 14 MG/DL (ref 6–20)
CO2: 25 mmol/L (ref 21–32)
Calcium: 9.9 MG/DL (ref 8.5–10.1)
Chloride: 102 mmol/L (ref 97–108)
Creatinine: 0.94 MG/DL (ref 0.55–1.02)
GFR African American: >60 mL/min/{1.73_m2} (ref >60)
Glucose: 80 mg/dL (ref 65–100)
Potassium: 3.6 mmol/L (ref 3.5–5.1)
Sodium: 137 mmol/L (ref 136–145)
eGFR NON-AA: 59 mL/min/{1.73_m2} — ABNORMAL LOW (ref >60)

## 2019-12-03 LAB — POCT GLUCOSE
POC Glucose: 125 mg/dL — ABNORMAL HIGH (ref 65–100)
POC Glucose: 90 mg/dL (ref 65–100)
POC Glucose: 98 mg/dL (ref 65–100)
POC Glucose: 163 mg/dL — ABNORMAL HIGH (ref 65–100)

## 2019-12-03 MED ORDER — ACETAMINOPHEN 325 MG TABLET
325 mg | Freq: Four times a day (QID) | ORAL | Status: DC
Start: 2019-12-03 — End: 2019-12-18
  Administered 2019-12-03 – 2019-12-19 (×60): via ORAL

## 2019-12-03 MED ORDER — GABAPENTIN 300 MG CAP
300 mg | Freq: Two times a day (BID) | ORAL | Status: DC
Start: 2019-12-03 — End: 2019-12-03

## 2019-12-03 MED ORDER — GABAPENTIN 100 MG CAP
100 mg | Freq: Every day | ORAL | Status: DC
Start: 2019-12-03 — End: 2019-12-18
  Administered 2019-12-03 – 2019-12-18 (×16): via ORAL

## 2019-12-03 MED ORDER — GABAPENTIN 300 MG CAP
300 mg | Freq: Every evening | ORAL | Status: DC
Start: 2019-12-03 — End: 2019-12-18
  Administered 2019-12-04 – 2019-12-19 (×16): via ORAL

## 2019-12-03 MED FILL — OXYCODONE 5 MG TAB: 5 mg | ORAL | Qty: 1

## 2019-12-03 MED FILL — VANCOMYCIN 1,000 MG IV SOLR: 1000 mg | INTRAVENOUS | Qty: 1000

## 2019-12-03 MED FILL — METRO I.V. 500 MG/100 ML INTRAVENOUS PIGGYBACK: 500 mg/100 mL | INTRAVENOUS | Qty: 100

## 2019-12-03 MED FILL — ASPIRIN 81 MG TAB, DELAYED RELEASE: 81 mg | ORAL | Qty: 1

## 2019-12-03 MED FILL — PANTOPRAZOLE 40 MG TAB, DELAYED RELEASE: 40 mg | ORAL | Qty: 1

## 2019-12-03 MED FILL — GABAPENTIN 300 MG CAP: 300 mg | ORAL | Qty: 1

## 2019-12-03 MED FILL — DOK 100 MG CAPSULE: 100 mg | ORAL | Qty: 1

## 2019-12-03 MED FILL — LOSARTAN 50 MG TAB: 50 mg | ORAL | Qty: 2

## 2019-12-03 MED FILL — SENNA LAX 8.6 MG TABLET: 8.6 mg | ORAL | Qty: 1

## 2019-12-03 MED FILL — ACETAMINOPHEN 325 MG TABLET: 325 mg | ORAL | Qty: 2

## 2019-12-03 MED FILL — CEFEPIME 2 GRAM SOLUTION FOR INJECTION: 2 gram | INTRAMUSCULAR | Qty: 2

## 2019-12-03 MED FILL — HYDROCHLOROTHIAZIDE 25 MG TAB: 25 mg | ORAL | Qty: 1

## 2019-12-03 MED FILL — ENOXAPARIN 40 MG/0.4 ML SUB-Q SYRINGE: 40 mg/0.4 mL | SUBCUTANEOUS | Qty: 0.4

## 2019-12-03 MED FILL — INSULIN GLARGINE 100 UNIT/ML INJECTION: 100 unit/mL | SUBCUTANEOUS | Qty: 1

## 2019-12-03 MED FILL — AMLODIPINE 5 MG TAB: 5 mg | ORAL | Qty: 2

## 2019-12-03 MED FILL — INSULIN LISPRO 100 UNIT/ML INJECTION: 100 unit/mL | SUBCUTANEOUS | Qty: 1

## 2019-12-03 MED FILL — GABAPENTIN 100 MG CAP: 100 mg | ORAL | Qty: 1

## 2019-12-03 NOTE — Progress Notes (Cosign Needed Addendum)
[facility]                                                                                          Hospitalist Progress Note  Hollice Espy, NP  Answering service: [phone] OR [phone] from in house phone        Date of Service:  12/03/2019  NAME:  Lindsey Huynh  DOB:  1951/05/20  MRN:  [id number]      Admission Summary:   From H&P 11/23/2019:  "Lindsey Huynh??is a 69 y.o.??female??with past medical history of diabetes, insulin-dependent, GERD and hypertension comes for return of left foot pain and swelling.  Patient is awake, alert and oriented able to answer my question follow my request, data also obtained in the ED staff and extensive chart review. ??As per collective reports, patient??left great toe pain which associated darkening of the skin on the distal end of the toe and swelling of the left foot the medial aspect of the Of the last 1 week and has been progressively getting worse. ??The only thing that he remembers was fungal nail cream. ??She reported since her left second toe is in contact with the first toe and that pain he also became slightly cellulitic, she also has some dorsal swelling of the foot as well. ??This all progress from being mildly achy to significant tender and swollen last 1 week and she came today because the pain was unbearable, 10/10, getting from her toe to her foot and made her ambulation significantly difficult. ??Although she continues to have pain at rest. ??She has not seek medical help for that until today, did not remember any trauma or injury to her feet. ??She is hemodynamically otherwise stable in the ED on her own. ??Patient was hemodynamically stable in the ED, and work revealed hypokalemia leukocytosis and x-ray the foot was without any acute abnormality. ??Given concerns for dry gangrene and cellulitis, she was started on IV vancomycin. Patient has no other medical complaints. ??Patient will be admitted to hospitalist??service for  evaluation management    Interval history / Subjective:    F/U for left foot cellulitis, left great toe gangrene, DMII, HTN, GERD, Hypokalemia, left popliteal occlusion  Seen and examined patient sitting up in bed. States that she is feeling fine but having pain in left great toe.    S/p fem- pop bypass of left leg (03/11)- Four surgical incisions noted to left leg with staples that are intact. Incisions are clean and dry. No signs of infections noted. Dressing noted to left foot.   Discussed PICC line placement, questions answered.   No overnight noted. No new complaints  Assessment & Plan:     Left great toe Osteomyelitis   - MRI LLE: 1. First distal phalanx osteomyelitis.  2. No abscess.  - IV antibiotics  - Consult ID for antibiotic recommendations   - Podiatry following   ??  LLE pain:??dry gangrene??of??left 2nd toe cellulitic changes/Left popliteal occlusion  -s/p left fem-pop bypass (03/11)  -CT LLE (03/06) NL  -Elevated Sed rate and CRP  -BC (03/06) NGTD   -Pain control  - Podiatry??following   -  MRI completed  ??  Anemia   Stable, No sign of bleeding noted.   - hgb 8.6  - Monitor labs and transfuse if hgb <7.0   ??  DMII:??hold metformin while IP  -Cont basal insulin, SSI  -Diabetic diet  -a1c 8.1  - DM following??  ??  HTN  -Controlled  -Cont home meds   ??  GERD  -Cont PPI  ??  Hypokalemia  -potassium 3.6  - Monitor and replace as needed.??    Code status: Full   DVT prophylaxis: Lovenox     Care Plan discussed with: Patient/Family, Nurse and Case Manager  Anticipated Disposition: Home w/Family  Anticipated Discharge: 24 hours to 48 hours   With patient's permission, Spoke with daughter Lindsey Huynh [phone]) Updated her on patient's condition and plan of care. Questions answered.      Hospital Problems  Date Reviewed: June 08, 2018          Codes Class Noted POA    Gangrene [facility]) ICD-10-CM: [id number]  ICD-9-CM: 785.4  11/23/2019 Unknown                Review of Systems:   A comprehensive review of systems was negative  except for that written in the HPI.       Vital Signs:    Last 24hrs VS reviewed since prior progress note. Most recent are:  Visit Vitals  BP (!) 141/71 (BP Patient Position: At rest)   Pulse 81   Temp 99 ??F (37.2 ??C)   Resp 16   Ht 5\' 5"  (1.651 m)   Wt 76.9 kg (169 lb 8 oz)   SpO2 99%   BMI 28.21 kg/m??         Intake/Output Summary (Last 24 hours) at 12/03/2019 1117  Last data filed at 12/02/2019 2226  Gross per 24 hour   Intake ???   Output 100 ml   Net -100 ml        Physical Examination:             Constitutional:  No acute distress, cooperative, pleasant, answers questions   ENT:  Oral mucosa moist, oropharynx benign.    Resp:  CTA bilaterally. No wheezing/rhonchi/rales. No accessory muscle use   CV:  Regular rhythm, normal rate, no murmurs, gallops, rubs    GI:  Soft, non distended, non tender. normoactive bowel sounds     Musculoskeletal:  No edema, warm, 2+ pulses throughout    Neurologic:  Moves all extremities.  AAOx3, CN II-XII reviewed     Psych:  Good insight, Not anxious nor agitated.   Skin:????Good turgor, no rashes or ulcers and Four left leg surgical incisions noted, staples intact. No signs of infection noted, guaze dressing noted to left food.        Data Review:    Review and/or order of clinical lab test  Review and/or order of tests in the radiology section of CPT  Review and/or order of tests in the medicine section of CPT      Labs:     Recent Labs     12/03/19  0543 12/02/19  0540   WBC 7.0 6.7   HGB 8.6* 8.4*   HCT 24.8* 24.8*   PLT 381 306     Recent Labs     12/03/19  0543 12/02/19  0540 12/01/19  0554   NA 137 134* 135*   K 3.6 3.6 3.2*   CL 102 102 101   CO2 25 23 24  BUN 14 18 16    CREA 0.94 0.98 0.93   GLU 80 108* 126*   CA 9.9 9.6 10.1     No results for input(s): ALT, AP, TBIL, TBILI, TP, ALB, GLOB, GGT, AML, LPSE in the last 72 hours.    No lab exists for component: SGOT, GPT, AMYP, HLPSE  No results for input(s): INR, PTP, APTT, INREXT in the last 72 hours.   No results for  input(s): FE, TIBC, PSAT, FERR in the last 72 hours.   No results found for: FOL, RBCF   No results for input(s): PH, PCO2, PO2 in the last 72 hours.  No results for input(s): CPK, CKNDX, TROIQ in the last 72 hours.    No lab exists for component: CPKMB  Lab Results   Component Value Date/Time    Cholesterol, total 219 (H) 10/30/2018 10:27 AM    HDL Cholesterol 63 10/30/2018 10:27 AM    LDL, calculated 130 (H) 10/30/2018 10:27 AM    Triglyceride 131 10/30/2018 10:27 AM    CHOL/HDL Ratio 3.4 12/26/2007 09:12 AM     Lab Results   Component Value Date/Time    Glucose (POC) 90 12/03/2019 06:44 AM    Glucose (POC) 125 (H) 12/02/2019 09:25 PM    Glucose (POC) 151 (H) 12/02/2019 04:09 PM    Glucose (POC) 207 (H) 12/02/2019 11:23 AM    Glucose (POC) 128 (H) 12/02/2019 06:13 AM     No results found for: COLOR, APPRN, SPGRU, REFSG, PHU, PROTU, GLUCU, KETU, BILU, UROU, NITU, LEUKU, GLUKE, EPSU, BACTU, WBCU, RBCU, CASTS, UCRY      Medications Reviewed:     Current Facility-Administered Medications   Medication Dose Route Frequency   ??? acetaminophen (TYLENOL) tablet 650 mg  650 mg Oral Q6H   ??? gabapentin (NEURONTIN) capsule 300 mg  300 mg Oral QHS   ??? gabapentin (NEURONTIN) capsule 100 mg  100 mg Oral DAILY   ??? Vancomycin- Pharmacy to Dose   Other Rx Dosing/Monitoring   ??? metroNIDAZOLE (FLAGYL) IVPB premix 500 mg  500 mg IntraVENous Q12H   ??? vancomycin (VANCOCIN) 1,000 mg in 0.9% sodium chloride 250 mL (VIAL-MATE)  1,000 mg IntraVENous Q18H   ??? cefepime (MAXIPIME) 2 g in 0.9% sodium chloride (MBP/ADV) 100 mL MBP  2 g IntraVENous Q12H   ??? sodium chloride (NS) flush 5-40 mL  5-40 mL IntraVENous Q8H   ??? sodium chloride (NS) flush 5-40 mL  5-40 mL IntraVENous PRN   ??? sodium phosphate (FLEET'S) enema 1 Enema  1 Enema Rectal PRN   ??? oxyCODONE IR (ROXICODONE) tablet 5 mg  5 mg Oral Q4H PRN   ??? insulin glargine (LANTUS) injection 16 Units  16 Units SubCUTAneous QHS   ??? losartan (COZAAR) tablet 100 mg  100 mg Oral DAILY   ??? docusate  sodium (COLACE) capsule 100 mg  100 mg Oral BID   ??? senna (SENOKOT) tablet 8.6 mg  1 Tab Oral DAILY   ??? polyethylene glycol (MIRALAX) packet 17 g  17 g Oral DAILY PRN   ??? bisacodyL (DULCOLAX) suppository 10 mg  10 mg Rectal DAILY PRN   ??? hydroCHLOROthiazide (HYDRODIURIL) tablet 25 mg  25 mg Oral DAILY   ??? sodium chloride (NS) flush 5-40 mL  5-40 mL IntraVENous Q8H   ??? sodium chloride (NS) flush 5-40 mL  5-40 mL IntraVENous PRN   ??? acetaminophen (TYLENOL) suppository 650 mg  650 mg Rectal Q6H PRN   ??? promethazine (PHENERGAN) tablet 12.5 mg  12.5 mg Oral Q6H PRN    Or   ??? ondansetron (ZOFRAN) injection 4 mg  4 mg IntraVENous Q6H PRN   ??? glucose chewable tablet 16 g  4 Tab Oral PRN   ??? dextrose (D50W) injection syrg 12.5-25 g  25-50 mL IntraVENous PRN   ??? glucagon (GLUCAGEN) injection 1 mg  1 mg IntraMUSCular PRN   ??? insulin lispro (HUMALOG) injection   SubCUTAneous AC&HS   ??? aspirin delayed-release tablet 81 mg  81 mg Oral DAILY   ??? pantoprazole (PROTONIX) tablet 40 mg  40 mg Oral ACB   ??? amLODIPine (NORVASC) tablet 10 mg  10 mg Oral DAILY   ??? enoxaparin (LOVENOX) injection 40 mg  40 mg SubCUTAneous Q24H     ______________________________________________________________________  EXPECTED LENGTH OF STAY: 4d 2h  ACTUAL LENGTH OF STAY:          Mignon, NP

## 2019-12-03 NOTE — Progress Notes (Signed)
Problem: Mobility Impaired (Adult and Pediatric)  Goal: *Acute Goals and Plan of Care (Insert Text)  Description: FUNCTIONAL STATUS PRIOR TO ADMISSION: Patient was independent and active without use of DME.    HOME SUPPORT PRIOR TO ADMISSION: The patient lived alone with no local support.    Physical Therapy  Reassessed post-surgery 11/29/2019  1.  Patient will move from supine to sit and sit to supine , scoot up and down, and roll side to side in bed with supervision/set-up within 7 day(s).    2.  Patient will transfer from bed to chair and chair to bed with supervision/set-up using the least restrictive device within 7 day(s).  3.  Patient will perform sit to stand with supervision/set-up within 7 day(s).  4.  Patient will ambulate with supervision/set-up for 50 feet with the least restrictive device within 7 day(s).   5.  Patient will ascend/descend 4 stairs with cane and one handrail(s) with supervision/set-up within 7 day(s).   Goals remain appropriate and will continue to work toward same.      Physical Therapy Goals  Initiated 11/26/2019  1.  Patient will move from supine to sit and sit to supine , scoot up and down, and roll side to side in bed with supervision/set-up within 7 day(s).    2.  Patient will transfer from bed to chair and chair to bed with supervision/set-up using the least restrictive device within 7 day(s).  3.  Patient will perform sit to stand with supervision/set-up within 7 day(s).  4.  Patient will ambulate with supervision/set-up for 50 feet with the least restrictive device within 7 day(s).   5.  Patient will ascend/descend 4 stairs with cane and one handrail(s) with supervision/set-up within 7 day(s).     Outcome: Progressing Towards Goal   PHYSICAL THERAPY TREATMENT  Patient: Lindsey Huynh [id number] y.o. female)  Date: 12/03/2019  Diagnosis: Gangrene ([place]) [I96] <principal problem not specified>  Procedure(s) (LRB):  LEFT FEMORAL-TIBIAL BYPASS WITH VEIN (Left) 5 Days Post-Op  Precautions:  Fall, WBAT  Chart, physical therapy assessment, plan of care and goals were reviewed.    ASSESSMENT  Patient continues with skilled PT services and is progressing towards goals. Noted large post-op shoe on counter and pt tried it on. Prefers to wear the blue post-op shoe provided yesterday because she feels better balanced since sole of blue shoe is thinner than the black. This afternoon she was able to progress amb to bathroom and outside of room door w/ gait belt and post-op shoe donned w/o c/o pain (RW +CGA). Pt she was able to demonstrate bed mobility w/ SBA/Supervision w/ HOB elevated and sit<>stand w/ CGA, still needing VC for hand placement. She utilized BSC while in bthrm because the elevated toilet seat was too high for her. She performed hygiene after voiding seated w/ mod I. She remained OOB in chair w/all needs in reach.     Current Level of Function Impacting Discharge (mobility/balance): SBA to CGA for functional transfers and gait w/ RW.    Other factors to consider for discharge:Lives alone         PLAN :  Patient continues to benefit from skilled intervention to address the above impairments.  Continue treatment per established plan of care.  to address goals.    Recommendation for discharge: (in order for the patient to meet his/her long term goals)  To be determined: Rehab (IPR) if pt is unable to obtain daily support/supervision at home along w/ HHPT.  This discharge recommendation:  Has been made in collaboration with the attending provider and/or case management    IF patient discharges home will need the following DME: rolling walker       SUBJECTIVE:   Patient stated ???I'm going to sit up in the chair for a little bit.Marland Kitchen???    OBJECTIVE DATA SUMMARY:   Critical Behavior:  Neurologic State: Alert, Appropriate for age  Orientation Level: Oriented X4  Cognition: Appropriate decision making, Appropriate for age attention/concentration, Appropriate safety awareness, Follows commands     Functional  Mobility Training:  Bed Mobility:     Supine to Sit: Supervision;Stand-by assistance;Assist x1;Bed Modified(HOB elevated)  Sit to Supine: (remained OOB in chair)           Transfers:  Sit to Stand: Contact guard assistance;Assist x1(verbal cues for hand placement)  Stand to Sit: Contact guard assistance;Assist x1                             Balance:  Sitting: Intact  Standing: Impaired  Standing - Static: Constant support;Good  Standing - Dynamic : Constant support;Fair  Ambulation/Gait Training:  Distance (ft): 20 Feet (ft)  Assistive Device: Gait belt;Walker, rolling;Other (comment)(post-op shoe)  Ambulation - Level of Assistance: Contact guard assistance;Additional time;Assist x1        Gait Abnormalities: Antalgic;Decreased step clearance;Step to gait  Right Side Weight Bearing: Full  Left Side Weight Bearing: As tolerated(post- op shoe)  Base of Support: Narrowed  Stance: Left decreased  Speed/Cadence: Slow  Step Length: Left shortened;Right shortened  Swing Pattern: Left asymmetrical       Pain Rating:  No verbal c/o pain    Activity Tolerance:   Good    After treatment patient left in no apparent distress:   Sitting in chair and Call bell within reach    COMMUNICATION/COLLABORATION:   The patient???s plan of care was discussed with: Registered nurse.     Miranda A Means,PTA   Time Calculation: 33 mins

## 2019-12-03 NOTE — Other (Signed)
[facility]    CLINICAL NURSE SPECIALIST CONSULT     FOLLOW UP  NOTE  Initial Presentation   Lindsey Huynh is a 69 y.o. female admitted over weekend with left foot pain. Left great toe gangrenous.   Initial work up and evaluation reveals NO osteomyelitits.      HX:   Past Medical History:   Diagnosis Date   ??? Diabetes ([place])    ??? GERD (gastroesophageal reflux disease)    ??? Hypercholesterolemia    ??? Hypertension         DX: CT-no osteo/ Xray-unremarkable    TX: Vascular surgery consulted- left leg medially with healing ulcerations consistent with venous ulcer. Plan for flow studies today.     Hospital course   Clinical progress has been complicated by venous insufficiency - BG trends mostly within goal during admission.  S/p Fem-Pop bypass on 3/11 and recovering well-Dr. Timmothy Sours following  Podiatry following for left 1st and 2nd toe diabetic ulcers with necrotic eschar present. NO surgical intervention chosen at this time-treating conservatively.     Diabetes    Patient has known Type 2 diabetes, treated with Levemir and Metformin PTA.     Admission BG 170 and A1c 8.15 indicate more acceptable diabetes control. Previous A1C 03/2019 11.0% . Highest A1C Feb. 2020-12.1%.   Ambulatory blood glucose management provided by primary care provider-Last seen 11/05/2019 virtual visit  Consulted by Provider for advanced diabetes nursing assessment and care, specifically related to   []  Transitioning off Glucostabilizer   []  Inpatient management strategy  [x]  Home management assessment  []  Survival skill education    Diabetes-related medical history  Acute complications  NONE  Neurological complications  Peripheral neuropathy  Microvascular disease  NONE  Macrovascular disease  Peripheral vascular disease and Foot wounds  Other associated conditions     HTN/hypercholesteremia/    Diabetes medication history  Drug class Currently in use Discontinued Never used   Biguanide Metformin 1000mg  daily in  divided doses     DDP-4 inhibitor       Sulfonylurea      Thiazolidinedione      GLP-1 RA      SGLT-2 inhibitors      Basal insulin Levemir 20units daily     Bolus insulin      Fixed Dose  Combinations        Subjective   S/p fem/pop bypass 11/28/19-recovering well.        Patient reports the following home diabetes self-care practices:  Eating pattern-cut out "sugar", made some diet changes, with carb intake, especially breads.     Physical activity pattern-desires to be physically active, but recent pain in left leg has her unable to do any physical activity    Monitoring pattern-checks every other day before and after meals.      Taking medications pattern  [x]  Consistent administration  [x]  Affordable    Social determinants of health impacting diabetes self-management practices   Concerned that you need to know more about how to stay healthy with diabetes     Objective   Physical exam  General Alert, oriented and in no acute distress. Conversant and cooperative.   Vital Signs   Visit Vitals  BP (!) 141/71 (BP Patient Position: At rest)   Pulse 81   Temp 99 ??F (37.2 ??C)   Resp 16   Ht 5\' 5"  (1.651 m)   Wt 76.9 kg (169 lb 8 oz)   SpO2 99%  BMI 28.21 kg/m??     Skin  Warm and dry.    Heart   Regular rate and rhythm. No murmurs, rubs or gallops  Lungs  Clear to auscultation without rales or rhonchi  Extremities     Diabetic foot exam:    Left Foot-=+2 edema noted left lower leg      Visual Exam: ulcer- left great toe under nail and 2nd toe-black eschar noted   Pulse DP: 1+ (weak)   Filament test: reduced sensation      Right Foot   Visual Exam: normal    Pulse DP: 2+ (normal)   Filament test: reduced sensation     DP & PT pulses +2.     Laboratory  BMP:   Lab Results   Component Value Date/Time    NA 137 12/03/2019 05:43 AM    K 3.6 12/03/2019 05:43 AM    CL 102 12/03/2019 05:43 AM    CO2 25 12/03/2019 05:43 AM    AGAP 10 12/03/2019 05:43 AM    GLU 80 12/03/2019 05:43 AM    BUN 14 12/03/2019 05:43 AM    CREA 0.94  12/03/2019 05:43 AM    GFRAA >60 12/03/2019 05:43 AM    GFRNA 59 (L) 12/03/2019 05:43 AM      Factors impacting BG management  Factor Dose Comments   Nutrition:  Carb-controlled meals     60 grams/meal      Pain Left foot    Infection     Other: venous insuffiencey  Vascular surgery following Flow studies pending     Blood glucose pattern        Assessment and Plan   Nursing Diagnosis Risk for unstable blood glucose pattern   Nursing Intervention Domain 5250 Decision-making Support   Nursing Interventions Examined current inpatient diabetes control   Explored factors facilitating and impeding inpatient management  Identified self-management practices impeding diabetes control  Explored corrective strategies with patient and responsible inpatient provider   Informed patient of rational for insulin strategy while hospitalized    Instructed patient in importance of checking blood glucose DAILY before meals - discussed when she should check her BG after meals-2hours     Evaluation   [name], with Type 2 diabetes, did not achieve diabetes control prior to admission, as evidenced by admission BG of 170 and A1c of 81%.  She is followed closely by her PCP for continued diabetes management.  She last saw PCP via virtual visit in Feb. 2021. Her A1C is improved since July 2020 when it was >11%. She is concerned about the fate of her left foot, but is encouraged that her A1C has lowered significantly since July 2020.  S/p fem/pop bypass on 3/11 and recovering.  Slated for rehab in coming days.      BG trends have been within goal 100-180mg /dl for the most part.    Recommendations     [x]  Use of Subcutaneous Insulin Order set [phone])  Insulin Dosing Specific recommendation   CONTINUE  Basal                                      (Based on weight, BMI & GFR) 16 units daily (20% reduction)    CONTINUE Corrective                                       (  Useful in adjusting insulin dosing) [x]  Normal sensitivity          Discharge  Planning   1.   Discharge on hospital doses of insulin.    Billing Code(s)   [x]  99231 IP subsequent hospital care - 15 minutes    Before making these care recommendations, I personally reviewed the hospitalization record, including notes, laboratory & diagnostic data and current medications, and   examined the patient at the bedside (circumstances permitting) before making care recommendations.     Total minutes: 15    Lily Lovings, CNS  Diabetes Clinical Nurse Specialist  [facility]  Access via [place]

## 2019-12-03 NOTE — Other (Signed)
Vascular Disease [facility] - Care Day 10 (12/02/2019) by Romeo Apple, RN    ??  Review Status Review Entered   Completed 12/02/2019 14:53   ??  Criteria Review      Care Day: 10 Care Date: 12/02/2019 Level of Care: Telemetry    Guideline Day 3    Level Of Care    (X) * Activity level acceptable    Clinical Status    (X) * Hemodynamic stability    12/02/2019 14:53:44 EDT by Cherlyn Labella    ?? 98,81,16,132/73,99% ra    (X) * Vascular, soft tissue, and wound status acceptable    ( ) * No infection, or status acceptable    12/02/2019 14:53:44 EDT by Cherlyn Labella    ?? Vancomycin 1000 mg IV q18h per pharmacy dosing, Flagyl 500 mg IV q12h, Maxipime 2 g IV q8h  osteomyelitis of the left great toe    (X) * Pain and nausea absent or adequately managed    12/02/2019 14:53:44 EDT by Cherlyn Labella    ?? Roxicodone 5 mg po q4h prn ??? x3    ( ) * General Discharge Criteria met    Interventions    (X) * Vascular access established or not needed    (X) * No anticoagulation required, or regimen has been established    12/02/2019 14:53:44 EDT by Cherlyn Labella    ?? Lovenox 40 mg sq qd    (X) * Intake acceptable    12/02/2019 14:53:44 EDT by Cherlyn Labella    ?? diabetic diet    ( ) * No inpatient interventions needed    * Milestone   Additional Notes   Surgical 98,81,16,132/73,99% ra   Remove left leg dressings and leave incisions open unless draining, diabetic diet   KCl 40 meq po now , ??apply left post op shoe, ID consult   VASC Note - Vascular: No complaints, incisions OK Doing well POD #4 after left leg bypass - can go home when ambulatory or to rehab anytime from my standpoint   Podiatry Note- Pt complains of wound to left 1, 2 toes. ??Previous tx include none. ??negative for fever, chills, nausea, vomiting, chest pain, shortness of breath. ?? 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 Pt is s/p fem pop bypass (vascular surgery following), diabetes with ulcer, peripheral vascular ds Discussed with vascular surgery Pt evaluated and tx. Dressing consisting of betadine/dsd applied, recommend HHC with betadine and dsd daily to left 1, 2 toes ??   Discussed risks of toe amputation, biopsy, such as need for further amputation/slow wound healing and benefits such as treatment of ulceration, osteomyelitis. Patient electing for conservative care with local wound care and IV antibiotic treatment (per ID recommendations) ??   Pt can f/u with me at the [facility] within one week of discharge (Wednesday afternoons)   ID Note - IMPRESSION   ??   #1 diabetic left foot infection with osteomyelitis of the left great toe   ??   #2 hyperlipidemia   ??   #3 hypertension   ??   #4 diabetes   ????PLAN ---I discussed with the patient that she has bone infection in the left great toe. ??I explained to her that there could be 2 options either an amputation of at least the distal part of the left  great toe or just treatment with antibiotics. ??There is a significant failure rate with the latter I explained. ??She will need podiatry follow-up. ??We should inform Dr. Katrinka Blazing about the findings. ??I will change Zosyn to cefepime and Flagyl. ??We should continue vancomycin.      ??  Vascular Disease [facility] - Care Day 9 (12/01/2019) by Romeo Apple, RN    ??  Review Status Review Entered   Completed 12/02/2019 14:41   ??  Criteria Review      Care Day: 9 Care Date: 12/01/2019 Level of Care: Telemetry    Guideline Day 3    Level Of Care    (X) * Activity level acceptable    Clinical Status    (X) * Hemodynamic stability    12/02/2019 14:41:28 EDT by Cherlyn Labella    ?? 98,89,18,123/78,98% ??ra    (X) * Vascular, soft tissue, and wound status acceptable    (X) * No infection, or status acceptable    (X) * Pain and nausea absent or adequately managed    12/02/2019 14:41:28 EDT by Cherlyn Labella    ?? Roxicodone 5 mg po q4h  prn ??? x4  Per MD Note - Patient to trial acetaminophen first and narcotics for breakthrough pain    ( ) * General Discharge Criteria met    Interventions    (X) * Vascular access established or not needed    (X) * No anticoagulation required, or regimen has been established    (X) * Intake acceptable    12/02/2019 14:41:28 EDT by Cherlyn Labella    ?? diabetic diet  Lovenox 40 mg sq qd, , miralax 17g po once, KCl 40 meq po now    ( ) * No inpatient interventions needed    * Milestone   Additional Notes   Surgical 98,89,18,123/78,98% ??ra   Remove left leg dressings and leave incisions open unless draining, diabetic diet   Lovenox 40 mg sq qd, , miralax 17g po once, KCl 40 meq po now    Rbc ??2.70, hgb ??8.4 hct ??24.6, ??lymph ??11, na 135, k 3.2, GFR > ??60   Vascular surg Note - Vascular: Leg incisions OK, palpable graft pulse, foot dressed Doing well POD # 3 after left leg bypass. ??Continue current care.   Hospitalist Note - Patient seen and examined, sitting up in bed. States that she is doing ok this morning.    Feels that pain has been controlled. Has only had incision tenderness and some burning in toes. Discussed alternating acetaminophen and narcotics. She is in agreement.    S/p fem-pop bypass of left leg (03/11). Four incisions noted with intact staples to left groin and leg.    Incisions appear to be healing well without signs of infection.    Encouraged continued use of IS and increase activity.    Discussed with RN- Patient to trial acetaminophen first and narcotics for breakthrough pain. Patient to be out of bed to chair with assistance for meals. She has been able to get to bedside commode.   No new complaints noted. No over night events noted.    ??   Assessment & Plan:   ??   LLE pain: dry gangrene of left 2nd toe cellulitic changes/Left popliteal occlusion   -s/p left fem-pop bypass (03/11)   -CT LLE (03/06) NL   -Elevated Sed rate and CRP   -BC (03/06) NGTD    -Pain control   - Podiatry following    - MRI  completed   ??   DMII: hold metformin while IP   -Cont basal insulin, SSI   -Diabetic diet   -a1c 8.1   - DM following    ??   HTN   -Controlled   -Cont home meds    ??   GERD   -Cont PPI   ??   Hypokalemia   -potassium 3.2   - Replaced    - Monitor and replace as needed.    ??   Code status: Full    DVT prophylaxis: Lovenox   ??   Care Plan discussed with: Patient/Family and Nurse   Anticipated Disposition: Home w/Family   Anticipated Discharge: 24 hours to 48 hours            ??  Vascular Disease [facility] - Care Day 8 (11/30/2019) by Romeo Apple, RN    ??  Review Status Review Entered   Completed 12/02/2019 14:29   ??  Criteria Review      Care Day: 8 Care Date: 11/30/2019 Level of Care: Telemetry    Guideline Day 3    Level Of Care    (X) * Activity level acceptable    12/02/2019 14:29:17 EDT by Cherlyn Labella    ?? up ad lib,    Clinical Status    (X) * Hemodynamic stability    12/02/2019 14:29:17 EDT by Cherlyn Labella    ?? 100.1 oral, ??94,14,95/61-111/75, 98% ra    (X) * Vascular, soft tissue, and wound status acceptable    ( ) * No infection, or status acceptable    12/02/2019 14:29:17 EDT by Cherlyn Labella    ?? WBC ??9.7  vancomycin 1000 mg IV q16h, ??per pharmacy dosing    (X) * Pain and nausea absent or adequately managed    ( ) * General Discharge Criteria met    Interventions    (X) * Vascular access established or not needed    (X) * No anticoagulation required, or regimen has been established    (X) * Intake acceptable    12/02/2019 14:29:17 EDT by Cherlyn Labella    ?? diabeticv diet  Lovenox 40 mg sq qd, oxycodone 5 mg po q4h prn ??? x5, morphine 2 mg IV q4h prn ??? x2, KCl 40 meq po now,    ( ) * No inpatient interventions needed    * Milestone   Additional Notes   Surgical 100.1 oral, ??94,14,95/61-111/75, 98% ra   Remove left leg dressings and leave incisioins open unless draining, diabetic diet,    Rbc ??2.91, hgb ??9.1 hct ??27.2, na 133, k 3.3,    Vascular Surgery - Looks good, foot pain much better Leg dressings  intact. Palpable graft pulse Appreciate podiatry consult Continue current care from my standpoint   IM Note - F/U for left foot cellulitis, left great toe gangrene, DMII, HTN, GERD, Hypokalemia, left popliteal occlusion. Patient seen and examined sitting up in bed. States that she is feeling good today, pain controlled.    S/p Fem-Pop bypass of left leg (03/11)- Four incisions noted with staples that are intact. Incisions appear to be healing without signs of infections.    Low grade temp of 100.1- ??Gave Incentive spirometer, educated on usage. Demonstrated proper usage. Encourage patient to be out of bed for meals and to increase activity with assistance.    ??   Assessment & Plan:   ??   LLE pain: dry gangrene of left 2nd toe cellulitic changes/Left popliteal occlusion   -  s/p left fem-pop bypass (03/11)   -CT LLE (03/06) NL   -Elevated Sed rate and CRP   -BC (03/06) NGTD    -Pain control   - Podiatry following    - MRI completed   ??   DMII: hold metformin while IP   -Cont basal insulin, SSI   -Diabetic diet   -a1c 8.1   - DM following    ??   HTN   -Controlled   -Cont home meds    ??   GERD   -Cont PPI   ??   Hypokalemia   -Resolved   ??   Code status: Full    DVT prophylaxis: Lovenox    ??   Care Plan discussed with: Patient/Family and Nurse   Anticipated Disposition: Home w/Family                  ??  Vascular Disease [facility] - Care Day 7 (11/29/2019) by Caffie Damme, RN    ??  Review Status Review Entered   Completed 11/29/2019 11:32   ??  Criteria Review      Care Day: 7 Care Date: 11/29/2019 Level of Care: Telemetry    Guideline Day 3    Level Of Care    (X) * Activity level acceptable    Clinical Status    (X) * Hemodynamic stability    ( ) * Vascular, soft tissue, and wound status acceptable    (X) * No infection, or status acceptable    (X) * Pain and nausea absent or adequately managed    ( ) * General Discharge Criteria met    Interventions    ( ) * Vascular access established or not needed    ( ) * No  anticoagulation required, or regimen has been established    (X) * Intake acceptable    ( ) * No inpatient interventions needed    * Milestone   Additional Notes   VASCULAR SURGERY 11/29/19: ??   Stable post left leg bypass. Pain controlled. Foot pain improved.   ??   Leg dressed, palpable graft pulse   ??   Continue current care, wound care to toes and continue antibiotics.   ??   DC Foley and start PT   ??   Toe wounds examined at time of surgery and are somewhat worse than anticipated - will ask Podiatry to see   ??    ??   ??   IM PN 3/12: popliteal occlusionPatient seen and examined. States that she is feeling ok today.    S/p Fem-Pop bypass of left leg (03/11)- Tender to touch. Pain is controlled. Dressings noted to left groin, leg, and foot that are clean, dry, and intact    No overnight events noted. No new complaints noted.   Assessment & Plan:   ??   LLE pain:??dry gangrene??of??left 2nd toe cellulitic changes/Left popliteal occlusion   -s/p left fem-pop bypass (03/11)   -CT LLE (03/06) NL   -Elevated Sed rate and CRP   -BC (03/06) NGTD    -Pain control   - Podiatry consulted.   ??   DMII:??hold metformin while IP   -Cont basal insulin, SSI   -Diabetic diet   -a1c 8.1   - DM following??   ??   HTN   -Controlled   -Cont home meds    ??   GERD   -Cont PPI   ??   Hypokalemia   -Resolved   ??   ??  Code status: Full   DVT prophylaxis: Lovenox   ??   Care Plan discussed with: Patient/Family, Nurse and Case Manager   Anticipated Disposition: Home w/Family   Anticipated Discharge: 24 hours to 48 hours   ??   ????   Physical Examination:   ??   ??   ???????????????????? ???????????????????? ???????????????????? ????????????????????    Constitutional:   No acute distress, cooperative, pleasant, answers questions??   ENT:   Oral mucosa moist, oropharynx benign.    Resp:   CTA bilaterally on room air. No wheezing/rhonchi/rales. No accessory muscle use   CV:   Regular rhythm, normal rate, no murmurs, gallops, rubs   GI:   Soft, non distended, non tender. normoactive bowel sounds    Musculoskeletal:   LLE +1 edema   Neurologic:   Moves all extremities.?? AAOx3, CN II-XII reviewed   ???????????????????? ???????????????????? Psych:?? Good insight, not anxious or agitated   ???????????????????????????????? Skin:??LLE medial large scabbed ulcer; right great toe erythematous, swollen, small area dry gangrene noted. Left groin, leg and foot dressing noted- clean, dry, and intact   ??   ??   ??   ??   ??   ??    HG 9.5    HCT 29    ??   ??   ??   128/73    99.17F    98HR    16RR    98%    ??   ??   VANCOCIN Q16H 1G IV X 1    MORPHINE Q2H PRN 4MG IV X 2      ??  Vascular Disease [facility] - Care Day 6 (11/28/2019) by Caffie Damme, RN    ??  Review Status Review Entered   Completed 11/29/2019 11:26   ??  Criteria Review      Care Day: 6 Care Date: 11/28/2019 Level of Care: Telemetry    Guideline Day 3    Level Of Care    (X) * Activity level acceptable    Clinical Status    (X) * Hemodynamic stability    11/29/2019 11:26:33 EST by Caffie Damme    ?? VITALS WNL    ( ) * Vascular, soft tissue, and wound status acceptable    (X) * No infection, or status acceptable    (X) * Pain and nausea absent or adequately managed    ( ) * General Discharge Criteria met    Interventions    ( ) * Vascular access established or not needed    ( ) * No anticoagulation required, or regimen has been established    (X) * Intake acceptable    ( ) * No inpatient interventions needed    * Milestone   Additional Notes   IM PN 3/11: Patient seen and examined. States that she had a large bowel movement last night, therefore feels much better.    Having left leg pain but feels it is being controlled with pain medication.    No new complaints. No overnight events noted.   Assessment & Plan:   ??   LLE pain:??dry gangrene??of??left 2nd toe cellulitic changes/Left popliteal occlusion   -for OR today??for fem-pop bypass   -CT LLE (03/06) NL   -Elevated Sed rate and CRP   -BC (03/06) NGTD    -Pain control   ??   DMII:??hold metformin while IP   -Cont basal insulin, SSI   -Diabetic diet   -a1c 8.1    - DM following   ??  HTN   -Controlled   -Cont home meds    ??   GERD   -Cont PPI   ??   Hypokalemia   -Resolved   ??   Code status: Full   DVT prophylaxis: Lovenox   ??   Care Plan discussed with: Patient/Family and Nurse   Anticipated Disposition: Home w/Family   Anticipated Discharge: 24 hours to 48 hours   ????   Physical Examination:   ??   ??   ???????????????????? ???????????????????? ???????????????????? ????????????????????    Constitutional:   No acute distress, cooperative, pleasant, answers questions   ENT:   Oral mucosa moist, oropharynx benign.    Resp:   CTA bilaterally on room air. No wheezing/rhonchi/rales. No accessory muscle use   CV:   Regular rhythm, normal rate, no murmurs, gallops, rubs   GI:   Soft, non distended, non tender. normoactive bowel sounds, no hepatosplenomegaly    Musculoskeletal:   LLE +1 edema.   Neurologic:   Moves all extremities.?? AAOx3, CN II-XII reviewed   ???????????????????? ???????????????????? Psych:?? Good insight, Not anxious nor agitated.   ?????????????????????????????????????????????? Skin: LLE medial large scabbed ulcer; right great toe erythematous, swollen, small area dry gangrene noted    ??   ??   ??   ??   ??   ??   OP NOTE: Date of Procedure: 11/28/2019    ??   Pre-Op Diagnosis: PERIPHERAL VASCULAR DISEASE WITH ULCERATION   ??   Post-Op Diagnosis: Same as preoperative diagnosis.??    ??   Procedure(s):   LEFT SFA peroneal BYPASS WITH VEIN   ??   Surgeon(s):   Belenda Cruise, MD   ??   Surgical Assistant: Surg Asst-1: Luis Abed   ??   Anesthesia: General    ??   Estimated Blood Loss (mL): less than 100   ??   Complications: None   ??   Specimens: * No specimens in log *    ??   Implants: * No implants in log *   ??   Drains: * No LDAs found *   ??   Findings: GOOD VEIN   ??   Electronically Signed by Belenda Cruise, MD on 11/28/2019 at 2:24 PM   ??    ??   ??   ??   HG 11.4    HCT 33.8    CA 10.4    GFR 57    ??   ??   EKG: Sinus rhythm with frequent premature ventricular complexes    Left ventricular hypertrophy with repolarization abnormality   ??    ??   ??   ??    127/74    99.54F    98HR    18RR    98%RA    ??   ??   ??   ??   NS 75ML/HR IV    FENTANYL 25MCG IV X 2    LR 75ML/HR IV    LR 125ML/HR IV    MORPHINE Q2H PRN 4MG IV X 4    VANCOCIN Q12H 1G IV X 2

## 2019-12-03 NOTE — Progress Notes (Signed)
TOC: Patient tentative plan is to get 6 weeks of IV antibiotics per MD. I/D plan pending. Referrals pending to SNF's. BLS to transport. Last rapid 3/8 negative. Will need updated covid test for placement.    RUR: 16%    Patient feels she would be unable to manage giving herself IV antibiotics, has limited help and lives alone. MD ok with sending SNF referrals. Pending referrals to [facility], [facility] and [facility] in [place].     Nathanial Millman RN/CRM

## 2019-12-03 NOTE — Telephone Encounter (Signed)
-----   Message from Daisy Floro sent at 12/03/2019  3:35 PM EDT -----  Regarding: IVEY/NP/TELEPHONE  Contact: [phone]  General Message/Vendor Calls    Caller's first and last name: Arilla Flis      Reason for call:  DMV form incomplete      Callback required yes/no and why: Yes      Best contact number(s): [phone]      Details to clarify the request: Per patient she received a letter from [facility], [facility], [facility] stating that the [facility] forms was incomplete. Patient would like a callback to discuss.      Daisy Floro

## 2019-12-03 NOTE — Progress Notes (Signed)
Vascular:    Complains of pain left great toe    Leg incisions OK, palpable graft pulse    Continue current care from my standpoint.

## 2019-12-03 NOTE — Procedures (Signed)
PICC Placement Note    PRE-PROCEDURE VERIFICATION  Correct Procedure: yes  Correct Site:  yes  Temperature: Temp: 99 ??F (37.2 ??C), Temperature Source: Temp Source: Oral  Recent Labs     12/03/19  0543   BUN 14   CREA 0.94   PLT 381   WBC 7.0     Allergies: Neuromuscular blockers, steroidal  Education materials, including PICC Booklet, for PICC Care given to patient: yes.   See Patient Education activity for further details.    PROCEDURE DETAIL  A double lumen PICC line was started for Home IV Therapy. The following documentation is in addition to the PICC properties in the lines/airways flowsheet :  Lot #: [id number]  Was xylocaine 1% used intradermally:  yes  Catheter Length: 35 (cm)  Vein Selection for PICC:right basilic  Central Line Bundle followed yes  Complication Related to Insertion: inability to advance catheter    The placement was verified by ECG/Sapiens technology:  The  tip location is on the right side and the tip is in the  superior vena cava. See ECG results for PICC tip placement.    Report given to nurse Maudie Mercury.    Line is okay to use.    Sandford Craze, RN

## 2019-12-03 NOTE — Progress Notes (Signed)
Bedside and Verbal shift change report given to Tasha (oncoming nurse) by Kim (offgoing nurse). Report included the following information SBAR, Kardex and MAR.

## 2019-12-03 NOTE — Telephone Encounter (Signed)
Pt called and stated that DMV informed her that the form's were incomplete.And that the pt wasn't sure what was needed to make it complete.Based off previous DMV form in [place] from 2020 pt's Blood Sugar Log was attached.Pt would like NP Ivey or the nurse to call DMV to see what is needed pt is in the hospital at this time.Any question's pt's # [id number].

## 2019-12-03 NOTE — Progress Notes (Signed)
Problem: Mobility Impaired (Adult and Pediatric)  Goal: *Acute Goals and Plan of Care (Insert Text)  Description: FUNCTIONAL STATUS PRIOR TO ADMISSION: Patient was independent and active without use of DME.    HOME SUPPORT PRIOR TO ADMISSION: The patient lived alone with no local support.    Physical Therapy  Reassessed post-surgery 11/29/2019  1.  Patient will move from supine to sit and sit to supine , scoot up and down, and roll side to side in bed with supervision/set-up within 7 day(s).    2.  Patient will transfer from bed to chair and chair to bed with supervision/set-up using the least restrictive device within 7 day(s).  3.  Patient will perform sit to stand with supervision/set-up within 7 day(s).  4.  Patient will ambulate with supervision/set-up for 50 feet with the least restrictive device within 7 day(s).   5.  Patient will ascend/descend 4 stairs with cane and one handrail(s) with supervision/set-up within 7 day(s).   Goals remain appropriate and will continue to work toward same.      Physical Therapy Goals  Initiated 11/26/2019  1.  Patient will move from supine to sit and sit to supine , scoot up and down, and roll side to side in bed with supervision/set-up within 7 day(s).    2.  Patient will transfer from bed to chair and chair to bed with supervision/set-up using the least restrictive device within 7 day(s).  3.  Patient will perform sit to stand with supervision/set-up within 7 day(s).  4.  Patient will ambulate with supervision/set-up for 50 feet with the least restrictive device within 7 day(s).   5.  Patient will ascend/descend 4 stairs with cane and one handrail(s) with supervision/set-up within 7 day(s).     Outcome: Progressing Towards Goal   PHYSICAL THERAPY TREATMENT  Patient: Lindsey Huynh (69 y.o. female)  Date: 12/03/2019  Diagnosis: Gangrene (HCC) [I96] <principal problem not specified>  Procedure(s) (LRB):  LEFT FEMORAL-TIBIAL BYPASS WITH VEIN (Left) 5 Days Post-Op  Precautions:  Fall, WBAT  Chart, physical therapy assessment, plan of care and goals were reviewed.    ASSESSMENT  Patient continues with skilled PT services and is progressing towards goals. Noted large post-op shoe on counter and pt tried it on. Prefers to wear the blue post-op shoe provided yesterday because she feels better balanced since sole of blue shoe is thinner than the black. This afternoon she was able to progress amb to bathroom and outside of room door w/ gait belt and post-op shoe donned w/o c/o pain (RW +CGA). Pt she was able to demonstrate bed mobility w/ SBA/Supervision w/ HOB elevated and sit<>stand w/ CGA, still needing VC for hand placement. She utilized BSC while in bthrm because the elevated toilet seat was too high for her. She performed hygiene after voiding seated w/ mod I. She remained OOB in chair w/all needs in reach.     Current Level of Function Impacting Discharge (mobility/balance): SBA to CGA for functional transfers and gait w/ RW.    Other factors to consider for discharge:Lives alone         PLAN :  Patient continues to benefit from skilled intervention to address the above impairments.  Continue treatment per established plan of care.  to address goals.    Recommendation for discharge: (in order for the patient to meet his/her long term goals)  To be determined: Rehab (IPR) if pt is unable to obtain daily support/supervision at home along w/ HHPT.  This discharge recommendation:  Has been made in collaboration with the attending provider and/or case management    IF patient discharges home will need the following DME: rolling walker       SUBJECTIVE:   Patient stated "I'm going to sit up in the chair for a little bit.."    OBJECTIVE DATA SUMMARY:   Critical Behavior:  Neurologic State: Alert, Appropriate for age  Orientation Level: Oriented X4  Cognition: Appropriate decision making, Appropriate for age attention/concentration, Appropriate safety awareness, Follows commands     Functional  Mobility Training:  Bed Mobility:     Supine to Sit: Supervision;Stand-by assistance;Assist x1;Bed Modified(HOB elevated)  Sit to Supine: (remained OOB in chair)           Transfers:  Sit to Stand: Contact guard assistance;Assist x1(verbal cues for hand placement)  Stand to Sit: Contact guard assistance;Assist x1                             Balance:  Sitting: Intact  Standing: Impaired  Standing - Static: Constant support;Good  Standing - Dynamic : Constant support;Fair  Ambulation/Gait Training:  Distance (ft): 20 Feet (ft)  Assistive Device: Gait belt;Walker, rolling;Other (comment)(post-op shoe)  Ambulation - Level of Assistance: Contact guard assistance;Additional time;Assist x1        Gait Abnormalities: Antalgic;Decreased step clearance;Step to gait  Right Side Weight Bearing: Full  Left Side Weight Bearing: As tolerated(post- op shoe)  Base of Support: Narrowed  Stance: Left decreased  Speed/Cadence: Slow  Step Length: Left shortened;Right shortened  Swing Pattern: Left asymmetrical       Pain Rating:  No verbal c/o pain    Activity Tolerance:   Good    After treatment patient left in no apparent distress:   Sitting in chair and Call bell within reach    COMMUNICATION/COLLABORATION:   The patient's plan of care was discussed with: Registered nurse.     Miranda A Means,PTA   Time Calculation: 33 mins

## 2019-12-03 NOTE — Progress Notes (Deleted)
Progress Notes by Hollice Espy, NP at 12/03/19 0900                Author: Hollice Espy, NP  Service: Nurse Practitioner  Author Type: Acute Care Nurse Practitioner       Filed: 12/04/19 0914  Date of Service: 12/03/19 0900  Status: Cosign Needed Addendum          Editor: Hollice Espy, NP (Acute Care Nurse Practitioner)       Related Notes: Original Note by Hollice Espy, NP (Acute Care Nurse Practitioner) filed at  12/03/19 1132          Cosign Required: Yes                    [facility]                                                                                              Hospitalist Progress Note   Hollice Espy, NP   Answering service: [phone] OR [phone] from in house phone            Date of Service:  12/03/2019   NAME:  Lindsey Huynh   DOB:  September 10, 1951   MRN:  [id number]           Admission Summary:        From H&P 11/23/2019:   "Lindsey Richardsonis a 70 y.o.femalewith past medical history of diabetes, insulin-dependent, GERD and hypertension comes for return of left foot pain and swelling.   Patient is awake, alert and oriented able to answer my question follow my request, data also obtained in the ED staff and extensive chart review. As per collective reports, patientleft  great toe pain which associated darkening of the skin on the distal end of the toe and swelling of the left foot the medial aspect of the Of the last 1 week and has been progressively getting worse. The only thing that he remembers  was fungal nail cream. She reported since her left second toe is in contact with the first toe and that pain he also became slightly cellulitic, she also has some dorsal swelling of the foot as well. This all progress from being mildly achy to  significant tender and swollen last 1 week and she came today because the pain was unbearable, 10/10, getting from her toe to her foot and made her ambulation significantly difficult.  Although she continues to have pain at rest. She has not seek  medical help for that until today, did not remember any trauma or injury to her feet. She is hemodynamically otherwise stable in the ED on her own. Patient was hemodynamically stable in the ED, and work revealed hypokalemia leukocytosis and x-ray  the foot was without any acute abnormality. Given concerns for dry gangrene and cellulitis, she was started on IV vancomycin. Patient has no other medical complaints. Patient will be admitted to hospitalistservice for evaluation management      Interval history / Subjective:         F/U for left foot  cellulitis, left great toe gangrene, DMII, HTN, GERD, Hypokalemia, left popliteal occlusion   Seen and examined patient sitting up in bed. States that she is feeling fine but having pain in left great toe.      S/p fem- pop bypass of left leg (03/11)- Four surgical incisions noted to left leg with staples that are intact. Incisions are clean and dry. No signs of infections noted. Dressing noted to left foot.    Discussed PICC line placement, questions answered.    No overnight noted. No new complaints     Assessment & Plan:          Left great toe Osteomyelitis    - MRI LLE: 1. First distal phalanx osteomyelitis.   2. No abscess.   - IV antibiotics   - Consult ID for antibiotic recommendations    - Podiatry following       LLE pain:dry gangreneofleft 2nd toe cellulitic changes/Left popliteal occlusion   -s/p left fem-pop bypass (03/11)   -CT LLE (03/06) NL   -Elevated Sed rate and CRP   -BC (03/06) NGTD    -Pain control   - Podiatryfollowing    - MRI completed      Anemia    Stable, No sign of bleeding noted.    - hgb 8.6   - Monitor labs and transfuse if hgb <7.0       DMII:hold metformin while IP   -Cont basal insulin, SSI   -Diabetic diet   -a1c 8.1   - DM following      HTN   -Controlled   -Cont home meds       GERD   -Cont PPI      Hypokalemia   -potassium 3.6   - Monitor and replace as  needed.      Code status: Full    DVT prophylaxis: Lovenox       Care Plan discussed with: Patient/Family, Nurse and Case Manager   Anticipated Disposition: Home w/Family   Anticipated Discharge: 24 hours to 48 hours    With patient's permission, Spoke with daughter Lindsey Huynh [phone]) Updated her on patient's condition and plan of care. Questions answered.            Hospital Problems   Date Reviewed:  05/31/2018                         Codes  Class  Noted  POA              Gangrene [facility])  ICD-10-CM: [id number]   ICD-9-CM: 785.4    11/23/2019  Unknown                               Review of Systems:     A comprehensive review of systems was negative except for that written in the HPI.            Vital Signs:      Last 24hrs VS reviewed since prior progress note. Most recent are:   Visit Vitals      BP  (!) 141/71 (BP Patient Position: At rest)     Pulse  81     Temp  99 F (37.2 C)     Resp  16     Ht  5\' 5"  (1.651 m)     Wt  76.9 kg (169 lb 8 oz)  SpO2  99%        BMI  28.21 kg/m              Intake/Output Summary (Last 24 hours) at 12/03/2019 1117   Last data filed at 12/02/2019 2226     Gross per 24 hour        Intake  --        Output  100 ml        Net  -100 ml              Physical Examination:                     Constitutional:   No acute distress, cooperative, pleasant, answers questions     ENT:   Oral mucosa moist, oropharynx benign.      Resp:   CTA bilaterally. No wheezing/rhonchi/rales. No accessory muscle use     CV:   Regular rhythm, normal rate, no murmurs, gallops, rubs      GI:   Soft, non distended, non tender. normoactive bowel sounds       Musculoskeletal:   No edema, warm, 2+ pulses throughout         Neurologic:   Moves all extremities.  AAOx3, CN II-XII reviewed       Psych:  Good insight, Not anxious nor agitated.    Skin:Good turgor, no rashes or ulcers and Four left leg surgical incisions noted, staples intact. No signs of infection noted, guaze dressing noted to left food.                 Data Review:      Review and/or order of clinical lab test   Review and/or order of tests in the radiology section of CPT   Review and/or order of tests in the medicine section of CPT           Labs:          Recent Labs            12/03/19   0543  12/02/19   0540     WBC  7.0  6.7     HGB  8.6*  8.4*     HCT  24.8*  24.8*         PLT  381  306          Recent Labs             12/03/19   0543  12/02/19   0540  12/01/19   0554     NA  137  134*  135*     K  3.6  3.6  3.2*     CL  102  102  101     CO2  25  23  24      BUN  14  18  16      CREA  0.94  0.98  0.93     GLU  80  108*  126*          CA  9.9  9.6  10.1        No results for input(s): ALT, AP, TBIL, TBILI, TP, ALB, GLOB, GGT, AML, LPSE in the last 72 hours.      No lab exists for component: SGOT, GPT, AMYP, HLPSE   No results for input(s): INR, PTP, APTT, INREXT in the last 72 hours.    No results for input(s): FE, TIBC, PSAT, FERR in the  last 72 hours.    No results found for: FOL, RBCF    No results for input(s): PH, PCO2, PO2 in the last 72 hours.   No results for input(s): CPK, CKNDX, TROIQ in the last 72 hours.      No lab exists for component: CPKMB     Lab Results         Component  Value  Date/Time            Cholesterol, total  219 (H)  10/30/2018 10:27 AM       HDL Cholesterol  63  10/30/2018 10:27 AM       LDL, calculated  130 (H)  10/30/2018 10:27 AM       Triglyceride  131  10/30/2018 10:27 AM            CHOL/HDL Ratio  3.4  12/26/2007 09:12 AM          Lab Results         Component  Value  Date/Time            Glucose (POC)  90  12/03/2019 06:44 AM       Glucose (POC)  125 (H)  12/02/2019 09:25 PM       Glucose (POC)  151 (H)  12/02/2019 04:09 PM       Glucose (POC)  207 (H)  12/02/2019 11:23 AM            Glucose (POC)  128 (H)  12/02/2019 06:13 AM        No results found for: COLOR, APPRN, SPGRU, REFSG, PHU, PROTU, GLUCU, KETU, BILU, UROU, NITU, LEUKU, GLUKE, EPSU, BACTU, WBCU, RBCU, CASTS, UCRY           Medications Reviewed:           Current Facility-Administered Medications          Medication  Dose  Route  Frequency           ?  acetaminophen (TYLENOL) tablet 650 mg   650 mg  Oral  Q6H     ?  gabapentin (NEURONTIN) capsule 300 mg   300 mg  Oral  QHS     ?  gabapentin (NEURONTIN) capsule 100 mg   100 mg  Oral  DAILY     ?  Vancomycin- Pharmacy to Dose     Other  Rx Dosing/Monitoring     ?  metroNIDAZOLE (FLAGYL) IVPB premix 500 mg   500 mg  IntraVENous  Q12H     ?  vancomycin (VANCOCIN) 1,000 mg in 0.9% sodium chloride 250 mL (VIAL-MATE)   1,000 mg  IntraVENous  Q18H     ?  cefepime (MAXIPIME) 2 g in 0.9% sodium chloride (MBP/ADV) 100 mL MBP   2 g  IntraVENous  Q12H     ?  sodium chloride (NS) flush 5-40 mL   5-40 mL  IntraVENous  Q8H     ?  sodium chloride (NS) flush 5-40 mL   5-40 mL  IntraVENous  PRN     ?  sodium phosphate (FLEET'S) enema 1 Enema   1 Enema  Rectal  PRN     ?  oxyCODONE IR (ROXICODONE) tablet 5 mg   5 mg  Oral  Q4H PRN     ?  insulin glargine (LANTUS) injection 16 Units   16 Units  SubCUTAneous  QHS     ?  losartan (COZAAR) tablet 100 mg   100 mg  Oral  DAILY     ?  docusate sodium (COLACE) capsule 100 mg   100 mg  Oral  BID     ?  senna (SENOKOT) tablet 8.6 mg   1 Tab  Oral  DAILY     ?  polyethylene glycol (MIRALAX) packet 17 g   17 g  Oral  DAILY PRN     ?  bisacodyL (DULCOLAX) suppository 10 mg   10 mg  Rectal  DAILY PRN     ?  hydroCHLOROthiazide (HYDRODIURIL) tablet 25 mg   25 mg  Oral  DAILY     ?  sodium chloride (NS) flush 5-40 mL   5-40 mL  IntraVENous  Q8H     ?  sodium chloride (NS) flush 5-40 mL   5-40 mL  IntraVENous  PRN     ?  acetaminophen (TYLENOL) suppository 650 mg   650 mg  Rectal  Q6H PRN     ?  promethazine (PHENERGAN) tablet 12.5 mg   12.5 mg  Oral  Q6H PRN          Or           ?  ondansetron (ZOFRAN) injection 4 mg   4 mg  IntraVENous  Q6H PRN     ?  glucose chewable tablet 16 g   4 Tab  Oral  PRN     ?  dextrose (D50W) injection syrg 12.5-25 g   25-50 mL  IntraVENous  PRN     ?  glucagon (GLUCAGEN)  injection 1 mg   1 mg  IntraMUSCular  PRN     ?  insulin lispro (HUMALOG) injection     SubCUTAneous  AC&HS     ?  aspirin delayed-release tablet 81 mg   81 mg  Oral  DAILY     ?  pantoprazole (PROTONIX) tablet 40 mg   40 mg  Oral  ACB     ?  amLODIPine (NORVASC) tablet 10 mg   10 mg  Oral  DAILY           ?  enoxaparin (LOVENOX) injection 40 mg   40 mg  SubCUTAneous  Q24H        ______________________________________________________________________   EXPECTED LENGTH OF STAY: 4d 2h   ACTUAL LENGTH OF STAY:          10                    Raynelle Highland, NP

## 2019-12-03 NOTE — Progress Notes (Signed)
ID Progress Note  12/03/2019    Subjective:       Afebrile. No dyspnea, abdominal pain, headache or sore throat    ROS: No anaphylaxis, seizures, syncope, hematemesis, hematochezia     Objective:     Vitals:   Visit Vitals  BP 125/77 (BP Patient Position: At rest)   Pulse 81   Temp 97.8 ??F (36.6 ??C)   Resp 16   Ht 5' 5" (1.651 m)   Wt 76.9 kg (169 lb 8 oz)   SpO2 99%   BMI 28.21 kg/m??        Tmax:  Temp (24hrs), Avg:98.5 ??F (36.9 ??C), Min:97.8 ??F (36.6 ??C), Max:99.4 ??F (37.4 ??C)      Exam:    Not in distress  Pink conjunctivae, anicteric sclerae  No cervical lymphdenopathy   Lung clear, no rales, wheezes or rhonchi   Heart: s1, s2, RRR, no murmurs rubs or clicks  Abdomen: soft nontender, no guarding or rebound  Knees not warm or tender  Dorsum of left great toe has necrosis, the medial portion of left 2nd toe is necrotic   Speech fluent     Labs:   Lab Results   Component Value Date/Time    WBC 7.0 12/03/2019 05:43 AM    HGB 8.6 (L) 12/03/2019 05:43 AM    HCT 24.8 (L) 12/03/2019 05:43 AM    PLATELET 381 12/03/2019 05:43 AM    MCV 91.2 12/03/2019 05:43 AM     Lab Results   Component Value Date/Time    Sodium 137 12/03/2019 05:43 AM    Potassium 3.6 12/03/2019 05:43 AM    Chloride 102 12/03/2019 05:43 AM    CO2 25 12/03/2019 05:43 AM    Anion gap 10 12/03/2019 05:43 AM    Glucose 80 12/03/2019 05:43 AM    BUN 14 12/03/2019 05:43 AM    Creatinine 0.94 12/03/2019 05:43 AM    BUN/Creatinine ratio 15 12/03/2019 05:43 AM    GFR est AA >60 12/03/2019 05:43 AM    GFR est non-AA 59 (L) 12/03/2019 05:43 AM    Calcium 9.9 12/03/2019 05:43 AM    Bilirubin, total 0.2 11/23/2019 01:32 PM    Alk. 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  #2  hyperlipidemia  ??  #3 hypertension  ??  #4 diabetes  ??    Recommendations:       Discussed with podiatry. There are concerns that she will not heal a toe amputation. The patient would like to try IV abx. I explained to her that the lack of blood flow to the foot which makes healing an amputation difficult would also prevent abx from getting to the infection. I tild her that there is a good chance that IV abx would fail. She desires to try it. Will send on IV vanc and cefepime as well as oral flagyl     Lindsey Huynh V, MD

## 2019-12-03 NOTE — Procedures (Signed)
 PICC Placement Note    PRE-PROCEDURE VERIFICATION  Correct Procedure: yes  Correct Site:  yes  Temperature: Temp: 99 F (37.2 C), Temperature Source: Temp Source: Oral  Recent Labs     12/03/19  0543   BUN 14   CREA 0.94   PLT 381   WBC 7.0     Allergies: Neuromuscular blockers, steroidal  Education materials, including PICC Booklet, for PICC Care given to patient: yes.   See Patient Education activity for further details.    PROCEDURE DETAIL  A double lumen PICC line was started for Home IV Therapy. The following documentation is in addition to the PICC properties in the lines/airways flowsheet :  Lot #: [id number]  Was xylocaine  1% used intradermally:  yes  Catheter Length: 35 (cm)  Vein Selection for PICC:right basilic  Central Line Bundle followed yes  Complication Related to Insertion: inability to advance catheter    The placement was verified by ECG/Sapiens technology:  The  tip location is on the right side and the tip is in the  superior vena cava. See ECG results for PICC tip placement.    Report given to nurse Luke.    Line is okay to use.    Ronnald CHRISTELLA Smaller, RN

## 2019-12-03 NOTE — Progress Notes (Signed)
Bedside shift change report given to Leona (oncoming nurse) by Estill Bamberg RN (offgoing nurse). Report included the following information SBAR, Kardex and MAR.

## 2019-12-03 NOTE — Progress Notes (Signed)
TOC: Patient tentative plan is to get 6 weeks of IV antibiotics per MD. I/D plan pending. Referrals pending to SNF's. BLS to transport. Last rapid 3/8 negative. Will need updated covid test for placement.    RUR: 16%    Patient feels she would be unable to manage giving herself IV antibiotics, has limited help and lives alone. MD ok with sending SNF referrals. Pending referrals to [facility], [facility] and [facility] in [place].     Nathanial Millman RN/CRM

## 2019-12-03 NOTE — Group Note (Signed)
 Diabetes Mgmt by Moises Rosaline LABOR, CNS at 12/03/19 9063                Author: Moises Rosaline LABOR, CNS  Service: Certified Clinical Nurse Specialist  Author Type: Clinical Nurse Specialist       Filed: 12/03/19 0944  Date of Service: 12/03/19 0936  Status: Signed          Editor: Moises Rosaline LABOR, CNS (Clinical Nurse Specialist)               [place]   [facility]      CLINICAL NURSE SPECIALIST CONSULT       FOLLOW UP  NOTE     Initial Presentation     Lindsey Huynh is a 69 y.o.  female admitted over weekend with left foot pain . Left great toe gangrenous.   Initial work up and evaluation reveals NO osteomyelitits.        HX:      Past Medical History:        Diagnosis  Date         ?  Diabetes (HCC)       ?  GERD (gastroesophageal reflux disease)       ?  Hypercholesterolemia           ?  Hypertension              DX: CT-no osteo/ Xray-unremarkable      TX: Vascular surgery consulted- left leg medially with healing ulcerations consistent with venous ulcer. Plan for flow studies today.         Hospital course     Clinical progress has been complicated by venous insufficiency - BG trends mostly within goal during admission.   S/p Fem-Pop bypass on 3/11 and recovering well-Dr. Earvin following   Podiatry following for left 1st and 2nd toe diabetic ulcers with necrotic eschar present. NO surgical intervention chosen at this time-treating conservatively.         Diabetes      Patient has known Type 2 diabetes, treated with Levemir and Metformin   PTA.       Admission BG 170 and A1c 8.15 indicate more acceptable diabetes control. Previous A1C 03/2019 11.0% . Highest A1C Feb. 2020-12.1%.    Ambulatory blood glucose management provided by primary care provider-Last seen 11/05/2019 virtual visit   Consulted by Provider for advanced diabetes nursing assessment and care, specifically related  to    []  Transitioning off Glucostabilizer    []  Inpatient management strategy   [x]  Home  management assessment   []  Survival skill education      Diabetes-related medical history   Acute complications   NONE   Neurological complications   Peripheral neuropathy   Microvascular disease   NONE   Macrovascular disease   Peripheral vascular disease and Foot wounds   Other associated conditions      HTN/hypercholesteremia/      Diabetes medication history        Drug class  Currently in use  Discontinued  Never used          Biguanide  Metformin  1000mg  daily in divided doses              DDP-4 inhibitor                 Sulfonylurea                Thiazolidinedione  GLP-1 RA                SGLT-2 inhibitors                Basal insulin   Levemir 20units daily              Bolus insulin                 Fixed Dose  Combinations                Subjective     S/p fem/pop bypass 11/28/19-recovering well.           Patient reports the following home diabetes self-care practices:  Eating pattern-cut out sugar, made some diet changes, with carb intake, especially breads.       Physical activity pattern-desires to be physically active, but recent pain in left leg has her unable to do any physical activity      Monitoring pattern-checks every other day before and after meals.        Taking medications pattern   [x]  Consistent administration   [x]  Affordable      Social determinants of health impacting diabetes self-management practices    Concerned that you need to know more about how to stay healthy with diabetes         Objective     Physical exam   General Alert, oriented and in no acute distress. Conversant and cooperative.    Vital Signs    Visit Vitals      BP  (!) 141/71 (BP Patient Position: At rest)     Pulse  81     Temp  99 F (37.2 C)     Resp  16     Ht  5' 5 (1.651 m)     Wt  76.9 kg (169 lb 8 oz)     SpO2  99%        BMI  28.21 kg/m        Skin  Warm and dry.     Heart   Regular rate and rhythm. No murmurs, rubs or gallops   Lungs  Clear to auscultation without rales or rhonchi    Extremities      Diabetic foot exam:     Left Foot-=+2 edema noted left lower leg       Visual Exam: ulcer- left great toe under nail and 2nd toe-black eschar noted    Pulse DP: 1+ (weak)    Filament test: reduced sensation        Right Foot    Visual Exam: normal     Pulse DP: 2+ (normal)    Filament test: reduced sensation      DP & PT pulses +2.       Laboratory   BMP:      Lab Results         Component  Value  Date/Time            NA  137  12/03/2019 05:43 AM       K  3.6  12/03/2019 05:43 AM       CL  102  12/03/2019 05:43 AM       CO2  25  12/03/2019 05:43 AM       AGAP  10  12/03/2019 05:43 AM       GLU  80  12/03/2019 05:43 AM       BUN  14  12/03/2019 05:43 AM  CREA  0.94  12/03/2019 05:43 AM       GFRAA  >60  12/03/2019 05:43 AM            GFRNA  59 (L)  12/03/2019 05:43 AM         Factors impacting BG management       Factor  Dose  Comments         Nutrition:   Carb-controlled meals        60 grams/meal          Pain  Left foot           Infection             Other: venous insuffiencey   Vascular surgery following  Flow studies pending        Blood glucose pattern                Assessment and Plan        Nursing Diagnosis  Risk for unstable blood glucose pattern     Nursing Intervention Domain  5250 Decision-making Support        Nursing Interventions  Examined current inpatient diabetes control    Explored factors facilitating and impeding inpatient management   Identified self-management practices impeding diabetes control   Explored corrective strategies with patient and responsible inpatient provider    Informed patient of rational for insulin  strategy while hospitalized      Instructed patient in importance of checking blood glucose DAILY before meals - discussed when she should check her BG after meals-2hours          Evaluation     [name], with Type 2 diabetes, did not achieve diabetes control prior to admission, as evidenced by admission BG of 170 and A1c of 81%.  She is followed  closely by her PCP  for continued diabetes management.  She last saw PCP via virtual visit in Feb. 2021. Her A1C is improved since July 2020 when it was >11%. She is concerned about the fate of her left foot, but is encouraged that her A1C has lowered significantly since  July 2020.  S/p fem/pop bypass on 3/11 and recovering.  Slated for rehab in coming days.        BG trends have been within goal 100-180mg /dl for the most part.       Recommendations        [x]  Use of  Subcutaneous Insulin  Order set [id number])       Insulin   Dosing  Specific recommendation         CONTINUE  Basal                                      (Based on weight, BMI & GFR)  16 units daily (20% reduction)           CONTINUE Corrective                                       (Useful in adjusting insulin  dosing)  [x]   Normal sensitivity                  Discharge Planning     1.   Discharge on hospital doses of insulin .        Billing Code(s)     [  x] 99231 IP subsequent hospital  care - 15 minutes      Before making these care recommendations, I personally reviewed the hospitalization record, including notes, laboratory & diagnostic data and current medications, and    examined the patient at the bedside (circumstances permitting) before making care recommendations.      Total minutes: 15      Rosaline DELENA Bangs, CNS   Diabetes Clinical Nurse Specialist   [facility]   Access via [employer]

## 2019-12-03 NOTE — Progress Notes (Signed)
ID Progress Note  12/03/2019    Subjective:       Afebrile. No dyspnea, abdominal pain, headache or sore throat    ROS: No anaphylaxis, seizures, syncope, hematemesis, hematochezia     Objective:     Vitals:   Visit Vitals  BP 125/77 (BP Patient Position: At rest)   Pulse 81   Temp 97.8 ??F (36.6 ??C)   Resp 16   Ht 5' 5" (1.651 m)   Wt 76.9 kg (169 lb 8 oz)   SpO2 99%   BMI 28.21 kg/m??        Tmax:  Temp (24hrs), Avg:98.5 ??F (36.9 ??C), Min:97.8 ??F (36.6 ??C), Max:99.4 ??F (37.4 ??C)      Exam:    Not in distress  Pink conjunctivae, anicteric sclerae  No cervical lymphdenopathy   Lung clear, no rales, wheezes or rhonchi   Heart: s1, s2, RRR, no murmurs rubs or clicks  Abdomen: soft nontender, no guarding or rebound  Knees not warm or tender  Dorsum of left great toe has necrosis, the medial portion of left 2nd toe is necrotic   Speech fluent     Labs:   Lab Results   Component Value Date/Time    WBC 7.0 12/03/2019 05:43 AM    HGB 8.6 (L) 12/03/2019 05:43 AM    HCT 24.8 (L) 12/03/2019 05:43 AM    PLATELET 381 12/03/2019 05:43 AM    MCV 91.2 12/03/2019 05:43 AM     Lab Results   Component Value Date/Time    Sodium 137 12/03/2019 05:43 AM    Potassium 3.6 12/03/2019 05:43 AM    Chloride 102 12/03/2019 05:43 AM    CO2 25 12/03/2019 05:43 AM    Anion gap 10 12/03/2019 05:43 AM    Glucose 80 12/03/2019 05:43 AM    BUN 14 12/03/2019 05:43 AM    Creatinine 0.94 12/03/2019 05:43 AM    BUN/Creatinine ratio 15 12/03/2019 05:43 AM    GFR est AA >60 12/03/2019 05:43 AM    GFR est non-AA 59 (L) 12/03/2019 05:43 AM    Calcium 9.9 12/03/2019 05:43 AM    Bilirubin, total 0.2 11/23/2019 01:32 PM    Alk. 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  #2  hyperlipidemia  ??  #3 hypertension  ??  #4 diabetes  ??    Recommendations:       Discussed with podiatry. There are concerns that she will not heal a toe amputation. The patient would like to try IV abx. I explained to her that the lack of blood flow to the foot which makes healing an amputation difficult would also prevent abx from getting to the infection. I tild her that there is a good chance that IV abx would fail. She desires to try it. Will send on IV vanc and cefepime as well as oral flagyl     Blair Hailey, MD

## 2019-12-03 NOTE — Progress Notes (Signed)
Bedside shift change report given to [place] (oncoming nurse) by Estill Bamberg RN (offgoing nurse). Report included the following information SBAR, Kardex and MAR.

## 2019-12-03 NOTE — Telephone Encounter (Signed)
-----   Message from Daisy Floro sent at 12/03/2019  3:35 PM EDT -----  Regarding: IVEY/NP/TELEPHONE  Contact: [phone]  General Message/Vendor Calls    Caller's first and last name: Lindsey Huynh      Reason for call:  DMV form incomplete      Callback required yes/no and why: Yes      Best contact number(s): [phone]      Details to clarify the request: Per patient she received a letter from [facility] stating that the [facility] forms was incomplete. Patient would like a callback to discuss.      Daisy Floro

## 2019-12-04 ENCOUNTER — Encounter

## 2019-12-04 LAB — CBC WITH AUTOMATED DIFF
ABS. BASOPHILS: 0 10*3/uL (ref 0.0–0.1)
ABS. EOSINOPHILS: 0.6 10*3/uL — ABNORMAL HIGH (ref 0.0–0.4)
ABS. IMM. GRANS.: 0 10*3/uL (ref 0.00–0.04)
ABS. LYMPHOCYTES: 0.9 10*3/uL (ref 0.8–3.5)
ABS. MONOCYTES: 0.6 10*3/uL (ref 0.0–1.0)
ABS. NEUTROPHILS: 3 10*3/uL (ref 1.8–8.0)
ABSOLUTE NRBC: 0 10*3/uL (ref 0.00–0.01)
BASOPHILS: 0 % (ref 0–1)
EOSINOPHILS: 11 % — ABNORMAL HIGH (ref 0–7)
HCT: 22.6 % — ABNORMAL LOW (ref 35.0–47.0)
HGB: 7.8 g/dL — ABNORMAL LOW (ref 11.5–16.0)
IMMATURE GRANULOCYTES: 0 % (ref 0.0–0.5)
LYMPHOCYTES: 18 % (ref 12–49)
MCH: 31.5 PG (ref 26.0–34.0)
MCHC: 34.5 g/dL (ref 30.0–36.5)
MCV: 91.1 FL (ref 80.0–99.0)
MONOCYTES: 11 % (ref 5–13)
MPV: 9.9 FL (ref 8.9–12.9)
NEUTROPHILS: 59 % (ref 32–75)
NRBC: 0 PER 100 WBC
PLATELET: 344 10*3/uL (ref 150–400)
RBC: 2.48 M/uL — ABNORMAL LOW (ref 3.80–5.20)
RDW: 12.9 % (ref 11.5–14.5)
WBC: 5.1 10*3/uL (ref 3.6–11.0)

## 2019-12-04 LAB — VANCOMYCIN, TROUGH: Vancomycin,trough: 11.5 ug/mL — ABNORMAL HIGH (ref 5.0–10.0)

## 2019-12-04 LAB — GLUCOSE, POC
Glucose (POC): 163 mg/dL — ABNORMAL HIGH (ref 65–100)
Glucose (POC): 97 mg/dL (ref 65–100)
Glucose (POC): 166 mg/dL — ABNORMAL HIGH (ref 65–100)
Glucose (POC): 129 mg/dL — ABNORMAL HIGH (ref 65–100)

## 2019-12-04 LAB — METABOLIC PANEL, BASIC
Anion gap: 11 mmol/L (ref 5–15)
BUN/Creatinine ratio: 15 (ref 12–20)
BUN: 13 MG/DL (ref 6–20)
CO2: 25 mmol/L (ref 21–32)
Calcium: 9.1 MG/DL (ref 8.5–10.1)
Chloride: 102 mmol/L (ref 97–108)
Creatinine: 0.87 MG/DL (ref 0.55–1.02)
GFR est AA: >60 mL/min/{1.73_m2} (ref >60)
GFR est non-AA: >60 mL/min/{1.73_m2} (ref >60)
Glucose: 70 mg/dL (ref 65–100)
Potassium: 3.2 mmol/L — ABNORMAL LOW (ref 3.5–5.1)
Sodium: 138 mmol/L (ref 136–145)

## 2019-12-04 LAB — BASIC METABOLIC PANEL
Anion Gap: 11 mmol/L (ref 5–15)
BUN/Creatinine Ratio: 15 (ref 12–20)
BUN: 13 MG/DL (ref 6–20)
CO2: 25 mmol/L (ref 21–32)
Calcium: 9.1 MG/DL (ref 8.5–10.1)
Chloride: 102 mmol/L (ref 97–108)
Creatinine: 0.87 MG/DL (ref 0.55–1.02)
GFR African American: >60 mL/min/{1.73_m2} (ref >60)
Glucose: 70 mg/dL (ref 65–100)
Potassium: 3.2 mmol/L — ABNORMAL LOW (ref 3.5–5.1)
Sodium: 138 mmol/L (ref 136–145)
eGFR NON-AA: >60 mL/min/{1.73_m2} (ref >60)

## 2019-12-04 LAB — VANCOMYCIN TROUGH: Vancomycin Tr: 11.5 ug/mL — ABNORMAL HIGH (ref 5.0–10.0)

## 2019-12-04 LAB — POCT GLUCOSE
POC Glucose: 163 mg/dL — ABNORMAL HIGH (ref 65–100)
POC Glucose: 97 mg/dL (ref 65–100)
POC Glucose: 166 mg/dL — ABNORMAL HIGH (ref 65–100)
POC Glucose: 129 mg/dL — ABNORMAL HIGH (ref 65–100)

## 2019-12-04 LAB — CBC WITH AUTO DIFFERENTIAL
Basophils %: 0 % (ref 0–1)
Basophils Absolute: 0 10*3/uL (ref 0.0–0.1)
Eosinophils %: 11 % — ABNORMAL HIGH (ref 0–7)
Eosinophils Absolute: 0.6 10*3/uL — ABNORMAL HIGH (ref 0.0–0.4)
Granulocyte Absolute Count: 0 10*3/uL (ref 0.00–0.04)
Hematocrit: 22.6 % — ABNORMAL LOW (ref 35.0–47.0)
Hemoglobin: 7.8 g/dL — ABNORMAL LOW (ref 11.5–16.0)
Immature Granulocytes %: 0 % (ref 0.0–0.5)
Lymphocytes %: 18 % (ref 12–49)
Lymphocytes Absolute: 0.9 10*3/uL (ref 0.8–3.5)
MCH: 31.5 PG (ref 26.0–34.0)
MCHC: 34.5 g/dL (ref 30.0–36.5)
MCV: 91.1 FL (ref 80.0–99.0)
MPV: 9.9 FL (ref 8.9–12.9)
Monocytes %: 11 % (ref 5–13)
Monocytes Absolute: 0.6 10*3/uL (ref 0.0–1.0)
NRBC Absolute: 0 10*3/uL (ref 0.00–0.01)
Neutrophils %: 59 % (ref 32–75)
Neutrophils Absolute: 3 10*3/uL (ref 1.8–8.0)
Nucleated RBCs: 0 PER 100 WBC
Platelets: 344 10*3/uL (ref 150–400)
RBC: 2.48 M/uL — ABNORMAL LOW (ref 3.80–5.20)
RDW: 12.9 % (ref 11.5–14.5)
WBC: 5.1 10*3/uL (ref 3.6–11.0)

## 2019-12-04 MED ORDER — INSULIN GLARGINE 100 UNIT/ML INJECTION
100 unit/mL | Freq: Every evening | SUBCUTANEOUS | Status: DC
Start: 2019-12-04 — End: 2019-12-18
  Administered 2019-12-05 – 2019-12-18 (×14): via SUBCUTANEOUS

## 2019-12-04 MED ORDER — SODIUM CHLORIDE 0.9 % IV PIGGY BACK
2 gram | Freq: Three times a day (TID) | INTRAVENOUS | Status: DC
Start: 2019-12-04 — End: 2019-12-09
  Administered 2019-12-04 – 2019-12-09 (×16): via INTRAVENOUS

## 2019-12-04 MED ORDER — POTASSIUM CHLORIDE SR 10 MEQ TAB
10 mEq | ORAL | Status: AC
Start: 2019-12-04 — End: 2019-12-04
  Administered 2019-12-04: 14:00:00 via ORAL

## 2019-12-04 MED ORDER — PHARMACY VANCOMYCIN NOTE
Freq: Once | Status: AC
Start: 2019-12-04 — End: 2019-12-04
  Administered 2019-12-05

## 2019-12-04 MED FILL — OXYCODONE 5 MG TAB: 5 mg | ORAL | Qty: 1

## 2019-12-04 MED FILL — SENNA LAX 8.6 MG TABLET: 8.6 mg | ORAL | Qty: 1

## 2019-12-04 MED FILL — LOSARTAN 50 MG TAB: 50 mg | ORAL | Qty: 2

## 2019-12-04 MED FILL — ACETAMINOPHEN 325 MG TABLET: 325 mg | ORAL | Qty: 2

## 2019-12-04 MED FILL — PHARMACY VANCOMYCIN NOTE: Qty: 1

## 2019-12-04 MED FILL — GABAPENTIN 300 MG CAP: 300 mg | ORAL | Qty: 1

## 2019-12-04 MED FILL — VANCOMYCIN 1,000 MG IV SOLR: 1000 mg | INTRAVENOUS | Qty: 1000

## 2019-12-04 MED FILL — INSULIN GLARGINE 100 UNIT/ML INJECTION: 100 unit/mL | SUBCUTANEOUS | Qty: 1

## 2019-12-04 MED FILL — K-TAB 10 MEQ TABLET,EXTENDED RELEASE: 10 mEq | ORAL | Qty: 4

## 2019-12-04 MED FILL — DOK 100 MG CAPSULE: 100 mg | ORAL | Qty: 1

## 2019-12-04 MED FILL — CEFEPIME 2 GRAM SOLUTION FOR INJECTION: 2 gram | INTRAMUSCULAR | Qty: 2

## 2019-12-04 MED FILL — ASPIRIN 81 MG TAB, DELAYED RELEASE: 81 mg | ORAL | Qty: 1

## 2019-12-04 MED FILL — METRO I.V. 500 MG/100 ML INTRAVENOUS PIGGYBACK: 500 mg/100 mL | INTRAVENOUS | Qty: 100

## 2019-12-04 MED FILL — GABAPENTIN 100 MG CAP: 100 mg | ORAL | Qty: 1

## 2019-12-04 MED FILL — HYDROCHLOROTHIAZIDE 25 MG TAB: 25 mg | ORAL | Qty: 1

## 2019-12-04 MED FILL — INSULIN LISPRO 100 UNIT/ML INJECTION: 100 unit/mL | SUBCUTANEOUS | Qty: 1

## 2019-12-04 MED FILL — AMLODIPINE 5 MG TAB: 5 mg | ORAL | Qty: 2

## 2019-12-04 MED FILL — PANTOPRAZOLE 40 MG TAB, DELAYED RELEASE: 40 mg | ORAL | Qty: 1

## 2019-12-04 NOTE — Progress Notes (Signed)
Bedside and Verbal shift change report given to [place] (oncoming nurse) by Aniceto Boss RN (offgoing nurse). Report included the following information SBAR, Kardex and MAR.

## 2019-12-04 NOTE — Progress Notes (Signed)
Bedside and Verbal shift change report given to [place], Therapist, sports (Soil scientist) by Dossie Arbour, RN (offgoing nurse). Report included the following information SBAR, Procedure Summary, Intake/Output, MAR and Recent Results.

## 2019-12-04 NOTE — Other (Signed)
[place]    CLINICAL NURSE SPECIALIST CONSULT     FOLLOW UP  NOTE  Initial Presentation   Lindsey Huynh is a 69 y.o. female admitted over weekend with left foot pain. Left great toe gangrenous.   Initial work up and evaluation reveals NO osteomyelitits.      HX:   Past Medical History:   Diagnosis Date   ??? Diabetes ([place])    ??? GERD (gastroesophageal reflux disease)    ??? Hypercholesterolemia    ??? Hypertension         DX: CT-no osteo/ Xray-unremarkable    TX: Vascular surgery consulted- left leg medially with healing ulcerations consistent with venous ulcer. Plan for flow studies today.     Hospital course   Clinical progress has been complicated by venous insufficiency - BG trends mostly within goal during admission.  S/p Fem-Pop bypass on 3/11 and recovering well-Dr. Timmothy Sours following  Podiatry following for left 1st and 2nd toe diabetic ulcers with necrotic eschar present. NO surgical intervention chosen at this time-treating conservatively.     Diabetes    Patient has known Type 2 diabetes, treated with Levemir and Metformin PTA.     Admission BG 170 and A1c 8.15 indicate more acceptable diabetes control. Previous A1C 03/2019 11.0% . Highest A1C Feb. 2020-12.1%.   Ambulatory blood glucose management provided by primary care provider-Last seen 11/05/2019 virtual visit  Consulted by Provider for advanced diabetes nursing assessment and care, specifically related to   []  Transitioning off Glucostabilizer   []  Inpatient management strategy  [x]  Home management assessment  []  Survival skill education    Diabetes-related medical history  Acute complications  NONE  Neurological complications  Peripheral neuropathy  Microvascular disease  NONE  Macrovascular disease  Peripheral vascular disease and Foot wounds  Other associated conditions     HTN/hypercholesteremia/    Diabetes medication history  Drug class Currently in use Discontinued Never used   Biguanide Metformin 1000mg  daily in  divided doses     DDP-4 inhibitor       Sulfonylurea      Thiazolidinedione      GLP-1 RA      SGLT-2 inhibitors      Basal insulin Levemir 20units daily     Bolus insulin      Fixed Dose  Combinations        Subjective   S/p fem/pop bypass 11/28/19-recovering well.  Noted discussion with Hospitalist NP re: decision about amputation of right toes vs. 6weeks of antibiotics and may have to amputate anyway.        Patient reports the following home diabetes self-care practices:  Eating pattern-cut out "sugar", made some diet changes, with carb intake, especially breads.     Physical activity pattern-desires to be physically active, but recent pain in left leg has her unable to do any physical activity    Monitoring pattern-checks every other day before and after meals.      Taking medications pattern  [x]  Consistent administration  [x]  Affordable    Social determinants of health impacting diabetes self-management practices   Concerned that you need to know more about how to stay healthy with diabetes     Objective   Physical exam  General Alert, oriented and in no acute distress. Conversant and cooperative.   Vital Signs   Visit Vitals  BP (!) 150/71 (BP 1 Location: Left upper arm, BP Patient Position: At rest)   Pulse 81  Temp 98.3 ??F (36.8 ??C)   Resp 16   Ht 5\' 5"  (1.651 m)   Wt 76.9 kg (169 lb 8 oz)   SpO2 98%   BMI 28.21 kg/m??     Skin  Warm and dry.    Heart   Regular rate and rhythm. No murmurs, rubs or gallops  Lungs  Clear to auscultation without rales or rhonchi  Extremities     Diabetic foot exam:    Left Foot-=+2 edema noted left lower leg      Visual Exam: ulcer- left great toe under nail and 2nd toe-black eschar noted   Pulse DP: 1+ (weak)   Filament test: reduced sensation      Right Foot   Visual Exam: normal    Pulse DP: 2+ (normal)   Filament test: reduced sensation     DP & PT pulses +2.     Laboratory  BMP:   Lab Results   Component Value Date/Time    NA 138 12/04/2019 04:57 AM    K 3.2 (L)  12/04/2019 04:57 AM    CL 102 12/04/2019 04:57 AM    CO2 25 12/04/2019 04:57 AM    AGAP 11 12/04/2019 04:57 AM    GLU 70 12/04/2019 04:57 AM    BUN 13 12/04/2019 04:57 AM    CREA 0.87 12/04/2019 04:57 AM    GFRAA >60 12/04/2019 04:57 AM    GFRNA >60 12/04/2019 04:57 AM      Factors impacting BG management  Factor Dose Comments   Nutrition:  Carb-controlled meals     60 grams/meal      Pain Left foot    Infection     Other: venous insuffiencey  Vascular surgery following Flow studies pending     Blood glucose pattern        Assessment and Plan   Nursing Diagnosis Risk for unstable blood glucose pattern   Nursing Intervention Domain 5250 Decision-making Support   Nursing Interventions Examined current inpatient diabetes control   Explored factors facilitating and impeding inpatient management  Identified self-management practices impeding diabetes control  Explored corrective strategies with patient and responsible inpatient provider   Informed patient of rational for insulin strategy while hospitalized    Instructed patient in importance of checking blood glucose DAILY before meals - discussed when she should check her BG after meals-2hours     Evaluation   [name], with Type 2 diabetes, did not achieve diabetes control prior to admission, as evidenced by admission BG of 170 and A1c of 81%.  She is followed closely by her PCP for continued diabetes management.  She last saw PCP via virtual visit in Feb. 2021. Her A1C is improved since July 2020 when it was >11%. She is concerned about the fate of her left foot, but is encouraged that her A1C has lowered significantly since July 2020.  S/p fem/pop bypass on 3/11 and recovering.  Slated for rehab in coming days.      Fasting BG trends have been within target on daily Lantus dose.  Past few days fasting BG 100mg /dl.  Would recommend reduction in basal dose to decrease risk of AM hypoglycemia.  Noted mild pre prandial hyperglycemia throughout the day.  Requiring  minimal correctional insulin.    Recommendations     [x]  Use of Subcutaneous Insulin Order set [id number])  Insulin Dosing Specific recommendation   ADJUST  Basal                                      (  Based on weight, BMI & GFR) 12 units daily (20% reduction)    CONTINUE Corrective                                       (Useful in adjusting insulin dosing) [x]  Normal sensitivity          Discharge Planning   1.   Discharge on hospital doses of insulin.    Billing Code(s)   [x]  99231 IP subsequent hospital care - 15 minutes    Before making these care recommendations, I personally reviewed the hospitalization record, including notes, laboratory & diagnostic data and current medications, and   examined the patient at the bedside (circumstances permitting) before making care recommendations.     Total minutes: 15    Lily Lovings, CNS  Diabetes Clinical Nurse Specialist  [facility]  Access via [place]

## 2019-12-04 NOTE — Progress Notes (Cosign Needed Addendum)
Lindsey Huynh                                                                                          Hospitalist Progress Note  Lindsey Huynh, Lindsey Huynh  Answering service: [phone] OR [phone] from in house phone        Date of Service:  12/04/2019  NAME:  Lindsey Huynh  DOB:  1950-10-12  MRN:  [id number]      Admission Summary:   From H&P 11/23/2019:  "Lindsey Huynh??is a 69 y.o.??female??with past medical history of diabetes, insulin-dependent, GERD and hypertension comes for return of left foot pain and swelling.  Patient is awake, alert and oriented able to answer my question follow my request, data also obtained in the ED staff and extensive chart review. ??As per collective reports, patient??left great toe pain which associated darkening of the skin on the distal end of the toe and swelling of the left foot the medial aspect of the Of the last 1 week and has been progressively getting worse. ??The only thing that he remembers was fungal nail cream. ??She reported since her left second toe is in contact with the first toe and that pain he also became slightly cellulitic, she also has some dorsal swelling of the foot as well. ??This all progress from being mildly achy to significant tender and swollen last 1 week and she came today because the pain was unbearable, 10/10, getting from her toe to her foot and made her ambulation significantly difficult. ??Although she continues to have pain at rest. ??She has not seek medical help for that until today, did not remember any trauma or injury to her feet. ??She is hemodynamically otherwise stable in the ED on her own. ??Patient was hemodynamically stable in the ED, and work revealed hypokalemia leukocytosis and x-ray the foot was without any acute abnormality. ??Given concerns for dry gangrene and cellulitis, she was started on IV vancomycin. Patient has no other medical complaints. 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 States that she is feeling much better today. With gabapentin and scheduled tylenol, pain has been better controlled.    S/p fem- pop bypass of left leg (03/11)- Four surgical incisions noted to left leg with staples that are intact. Incisions are clean and dry. No signs of infections noted. Dressing noted to left foot.  States that she would like to speak with podiatry again. She feels that she wants to proceed with surgery if able to. She feels that 6 weeks of antibiotics with the possibly of amputation anyways, is too much for her.   Dr. Duwaine Maxin notified through [facility] that patient would like to have discussion with her.   No overnight events. No new complaints.   Assessment & Plan:     Left great toe Osteomyelitis   - MRI LLE: 1. First distal phalanx osteomyelitis.  2. No abscess.  -  IV antibiotics  - Vascular surgery following- Patient to follow up in 2 weeks post- op for staple removal.   - Consult ID for antibiotic recommendations   - Podiatry following   ??  LLE pain:??dry gangrene??of??left 2nd toe cellulitic changes/Left popliteal occlusion  -s/p left fem-pop bypass (03/11)  -CT LLE (03/06) NL  -Elevated Sed rate and CRP  -BC (03/06) NGTD   -Pain control  - Podiatry??following   - MRI completed  ??  Anemia   Stable, No sign of bleeding noted.   - hgb 7.8 today, drop from 8.6 yesterday. No signs of active bleeding noted.   - Monitor labs and transfuse if hgb <7.0   ??  DMII:??hold metformin while IP  -Cont basal insulin, SSI  -Diabetic diet  -a1c 8.1  - DM following??  ??  HTN  -Controlled  -Cont home meds   ??  GERD  -Cont PPI  ??  Hypokalemia  -potassium 3.2  - Replaced   - Monitor and replace as needed.??    Code status: Full   DVT prophylaxis: Lovenox    Care Plan discussed with:  Patient/Family, Nurse and Case Manager  Anticipated Disposition: SNF/LTC  Anticipated Discharge: 24 hours to 48 hours     Hospital Problems  Date Reviewed: 06/03/18          Codes Class Noted POA    Gangrene ([place]) ICD-10-CM: [id number]  ICD-9-CM: 785.4  11/23/2019 Unknown                Review of Systems:   A comprehensive review of systems was negative except for that written in the HPI.       Vital Signs:    Last 24hrs VS reviewed since prior progress note. Most recent are:  Visit Vitals  BP (!) 150/71 (BP 1 Location: Left upper arm, BP Patient Position: At rest)   Pulse 81   Temp 98.3 ??F (36.8 ??C)   Resp 16   Ht 5\' 5"  (1.651 m)   Wt 76.9 kg (169 lb 8 oz)   SpO2 98%   BMI 28.21 kg/m??       No intake or output data in the 24 hours ending 12/04/19 0903     Physical Examination:             Constitutional:  No acute distress, cooperative, pleasant, answers questions   ENT:  Oral mucosa moist, oropharynx benign.    Resp:  CTA bilaterally. No wheezing/rhonchi/rales. No accessory muscle use   CV:  Regular rhythm, normal rate, no murmurs, gallops, rubs    GI:  Soft, non distended, non tender. normoactive bowel sounds    Musculoskeletal:  No edema, warm, 2+ pulses throughout    Neurologic:  Moves all extremities.  AAOx3, CN II-XII reviewed, follows commands     Psych:  Good insight, Not anxious nor agitated.   Skin:????Good turgor, no rashes or ulcers and Four left leg surgical incisions noted, staples intact. No signs of infection noted  ??            Data Review:    Review and/or order of clinical lab test  Review and/or order of tests in the medicine section of CPT      Labs:     Recent Labs     12/04/19  0457 12/03/19  0543   WBC 5.1 7.0   HGB 7.8* 8.6*   HCT 22.6* 24.8*   PLT 344 381     Recent Labs  12/04/19  0457 12/03/19  0543 12/02/19  0540   NA 138 137 134*   K 3.2* 3.6 3.6   CL 102 102 102   CO2 25 25 23    BUN 13 14 18    CREA 0.87 0.94 0.98   GLU 70 80 108*   CA 9.1 9.9 9.6     No results for input(s): ALT, AP, TBIL,  TBILI, TP, ALB, GLOB, GGT, AML, LPSE in the last 72 hours.    No lab exists for component: SGOT, GPT, AMYP, HLPSE  No results for input(s): INR, PTP, APTT, INREXT in the last 72 hours.   No results for input(s): FE, TIBC, PSAT, FERR in the last 72 hours.   No results found for: FOL, RBCF   No results for input(s): PH, PCO2, PO2 in the last 72 hours.  No results for input(s): CPK, CKNDX, TROIQ in the last 72 hours.    No lab exists for component: CPKMB  Lab Results   Component Value Date/Time    Cholesterol, total 219 (H) 10/30/2018 10:27 AM    HDL Cholesterol 63 10/30/2018 10:27 AM    LDL, calculated 130 (H) 10/30/2018 10:27 AM    Triglyceride 131 10/30/2018 10:27 AM    CHOL/HDL Ratio 3.4 12/26/2007 09:12 AM     Lab Results   Component Value Date/Time    Glucose (POC) 97 12/04/2019 06:58 AM    Glucose (POC) 163 (H) 12/03/2019 09:28 PM    Glucose (POC) 163 (H) 12/03/2019 04:18 PM    Glucose (POC) 98 12/03/2019 12:01 PM    Glucose (POC) 90 12/03/2019 06:44 AM     No results found for: COLOR, APPRN, SPGRU, REFSG, PHU, PROTU, GLUCU, KETU, BILU, UROU, NITU, LEUKU, GLUKE, EPSU, BACTU, WBCU, RBCU, CASTS, UCRY      Medications Reviewed:     Current Facility-Administered Medications   Medication Dose Route Frequency   ??? acetaminophen (TYLENOL) tablet 650 mg  650 mg Oral Q6H   ??? gabapentin (NEURONTIN) capsule 300 mg  300 mg Oral QHS   ??? gabapentin (NEURONTIN) capsule 100 mg  100 mg Oral DAILY   ??? Vancomycin- Pharmacy to Dose   Other Rx Dosing/Monitoring   ??? metroNIDAZOLE (FLAGYL) IVPB premix 500 mg  500 mg IntraVENous Q12H   ??? vancomycin (VANCOCIN) 1,000 mg in 0.9% sodium chloride 250 mL (VIAL-MATE)  1,000 mg IntraVENous Q18H   ??? cefepime (MAXIPIME) 2 g in 0.9% sodium chloride (MBP/ADV) 100 mL MBP  2 g IntraVENous Q12H   ??? sodium chloride (NS) flush 5-40 mL  5-40 mL IntraVENous Q8H   ??? sodium chloride (NS) flush 5-40 mL  5-40 mL IntraVENous PRN   ??? sodium phosphate (FLEET'S) enema 1 Enema  1 Enema Rectal PRN   ??? oxyCODONE IR  (ROXICODONE) tablet 5 mg  5 mg Oral Q4H PRN   ??? insulin glargine (LANTUS) injection 16 Units  16 Units SubCUTAneous QHS   ??? losartan (COZAAR) tablet 100 mg  100 mg Oral DAILY   ??? docusate sodium (COLACE) capsule 100 mg  100 mg Oral BID   ??? senna (SENOKOT) tablet 8.6 mg  1 Tab Oral DAILY   ??? polyethylene glycol (MIRALAX) packet 17 g  17 g Oral DAILY PRN   ??? bisacodyL (DULCOLAX) suppository 10 mg  10 mg Rectal DAILY PRN   ??? hydroCHLOROthiazide (HYDRODIURIL) tablet 25 mg  25 mg Oral DAILY   ??? sodium chloride (NS) flush 5-40 mL  5-40 mL IntraVENous Q8H   ???  sodium chloride (NS) flush 5-40 mL  5-40 mL IntraVENous PRN   ??? acetaminophen (TYLENOL) suppository 650 mg  650 mg Rectal Q6H PRN   ??? promethazine (PHENERGAN) tablet 12.5 mg  12.5 mg Oral Q6H PRN    Or   ??? ondansetron (ZOFRAN) injection 4 mg  4 mg IntraVENous Q6H PRN   ??? glucose chewable tablet 16 g  4 Tab Oral PRN   ??? dextrose (D50W) injection syrg 12.5-25 g  25-50 mL IntraVENous PRN   ??? glucagon (GLUCAGEN) injection 1 mg  1 mg IntraMUSCular PRN   ??? insulin lispro (HUMALOG) injection   SubCUTAneous AC&HS   ??? aspirin delayed-release tablet 81 mg  81 mg Oral DAILY   ??? pantoprazole (PROTONIX) tablet 40 mg  40 mg Oral ACB   ??? amLODIPine (NORVASC) tablet 10 mg  10 mg Oral DAILY   ??? enoxaparin (LOVENOX) injection 40 mg  40 mg SubCUTAneous Q24H     ______________________________________________________________________  EXPECTED LENGTH OF STAY: 4d 2h  ACTUAL LENGTH OF STAY:          [place], Lindsey Huynh

## 2019-12-04 NOTE — Group Note (Signed)
 Diabetes Mgmt by Moises Lindsey LABOR, CNS at 12/04/19 1024                Author: Moises Lindsey LABOR, CNS  Service: Certified Clinical Nurse Specialist  Author Type: Clinical Nurse Specialist       Filed: 12/04/19 1033  Date of Service: 12/04/19 1024  Status: Signed          Editor: Moises Lindsey LABOR, CNS (Clinical Nurse Specialist)               [place]   [facility]      CLINICAL NURSE SPECIALIST CONSULT       FOLLOW UP  NOTE     Initial Presentation     Lindsey Huynh is a 69 y.o.  female admitted over weekend with left foot pain . Left great toe gangrenous.   Initial work up and evaluation reveals NO osteomyelitits.        HX:      Past Medical History:        Diagnosis  Date         ?  Diabetes (HCC)       ?  GERD (gastroesophageal reflux disease)       ?  Hypercholesterolemia           ?  Hypertension              DX: CT-no osteo/ Xray-unremarkable      TX: Vascular surgery consulted- left leg medially with healing ulcerations consistent with venous ulcer. Plan for flow studies today.         Hospital course     Clinical progress has been complicated by venous insufficiency - BG trends mostly within goal during admission.   S/p Fem-Pop bypass on 3/11 and recovering well-Dr. Earvin following   Podiatry following for left 1st and 2nd toe diabetic ulcers with necrotic eschar present. NO surgical intervention chosen at this time-treating conservatively.         Diabetes      Patient has known Type 2 diabetes, treated with Levemir and Metformin   PTA.       Admission BG 170 and A1c 8.15 indicate more acceptable diabetes control. Previous A1C 03/2019 11.0% . Highest A1C Feb. 2020-12.1%.    Ambulatory blood glucose management provided by primary care provider-Last seen 11/05/2019 virtual visit   Consulted by Provider for advanced diabetes nursing assessment and care, specifically related  to    []  Transitioning off Glucostabilizer    []  Inpatient management strategy   [x]  Home  management assessment   []  Survival skill education      Diabetes-related medical history   Acute complications   NONE   Neurological complications   Peripheral neuropathy   Microvascular disease   NONE   Macrovascular disease   Peripheral vascular disease and Foot wounds   Other associated conditions      HTN/hypercholesteremia/      Diabetes medication history        Drug class  Currently in use  Discontinued  Never used          Biguanide  Metformin  1000mg  daily in divided doses              DDP-4 inhibitor                 Sulfonylurea                Thiazolidinedione  GLP-1 RA                SGLT-2 inhibitors                Basal insulin   Levemir 20units daily              Bolus insulin                 Fixed Dose  Combinations                Subjective     S/p fem/pop bypass 11/28/19-recovering well.  Noted discussion with Hospitalist NP re: decision about amputation of right toes vs. 6weeks of antibiotics and may have to amputate  anyway.           Patient reports the following home diabetes self-care practices:  Eating pattern-cut out sugar, made some diet changes, with carb intake, especially breads.       Physical activity pattern-desires to be physically active, but recent pain in left leg has her unable to do any physical activity      Monitoring pattern-checks every other day before and after meals.        Taking medications pattern   [x]  Consistent administration   [x]  Affordable      Social determinants of health impacting diabetes self-management practices    Concerned that you need to know more about how to stay healthy with diabetes         Objective     Physical exam   General Alert, oriented and in no acute distress. Conversant and cooperative.    Vital Signs    Visit Vitals      BP  (!) 150/71 (BP 1 Location: Left upper arm, BP Patient Position: At rest)     Pulse  81     Temp  98.3 F (36.8 C)     Resp  16     Ht  5' 5 (1.651 m)     Wt  76.9 kg (169 lb 8 oz)     SpO2  98%         BMI  28.21 kg/m        Skin  Warm and dry.     Heart   Regular rate and rhythm. No murmurs, rubs or gallops   Lungs  Clear to auscultation without rales or rhonchi   Extremities      Diabetic foot exam:     Left Foot-=+2 edema noted left lower leg       Visual Exam: ulcer- left great toe under nail and 2nd toe-black eschar noted    Pulse DP: 1+ (weak)    Filament test: reduced sensation        Right Foot    Visual Exam: normal     Pulse DP: 2+ (normal)    Filament test: reduced sensation      DP & PT pulses +2.       Laboratory   BMP:      Lab Results         Component  Value  Date/Time            NA  138  12/04/2019 04:57 AM       K  3.2 (L)  12/04/2019 04:57 AM       CL  102  12/04/2019 04:57 AM       CO2  25  12/04/2019 04:57 AM       AGAP  11  12/04/2019 04:57 AM       GLU  70  12/04/2019 04:57 AM       BUN  13  12/04/2019 04:57 AM       CREA  0.87  12/04/2019 04:57 AM       GFRAA  >60  12/04/2019 04:57 AM            GFRNA  >60  12/04/2019 04:57 AM         Factors impacting BG management       Factor  Dose  Comments         Nutrition:   Carb-controlled meals        60 grams/meal          Pain  Left foot           Infection             Other: venous insuffiencey   Vascular surgery following  Flow studies pending        Blood glucose pattern                Assessment and Plan        Nursing Diagnosis  Risk for unstable blood glucose pattern     Nursing Intervention Domain  5250 Decision-making Support        Nursing Interventions  Examined current inpatient diabetes control    Explored factors facilitating and impeding inpatient management   Identified self-management practices impeding diabetes control   Explored corrective strategies with patient and responsible inpatient provider    Informed patient of rational for insulin  strategy while hospitalized      Instructed patient in importance of checking blood glucose DAILY before meals - discussed when she should check her BG after meals-2hours          Evaluation      [name], with Type 2 diabetes, did not achieve diabetes control prior to admission, as evidenced by admission BG of 170 and A1c of 81%.  She is followed closely by her PCP  for continued diabetes management.  She last saw PCP via virtual visit in Feb. 2021. Her A1C is improved since July 2020 when it was >11%. She is concerned about the fate of her left foot, but is encouraged that her A1C has lowered significantly since  July 2020.  S/p fem/pop bypass on 3/11 and recovering.  Slated for rehab in coming days.        Fasting BG trends have been within target on daily Lantus dose.  Past few days fasting BG 100mg /dl.  Would recommend reduction in basal dose to decrease risk of AM hypoglycemia.  Noted mild pre prandial hyperglycemia throughout the day.  Requiring minimal  correctional insulin .       Recommendations        [x]  Use of  Subcutaneous Insulin  Order set [phone])       Insulin   Dosing  Specific recommendation         ADJUST  Basal                                      (Based on weight, BMI & GFR)  12 units daily (20% reduction)           CONTINUE Corrective                                       (  Useful in adjusting insulin  dosing)  [x]   Normal sensitivity                  Discharge Planning     1.   Discharge on hospital doses of insulin .        Billing Code(s)     [x]  99231 IP subsequent hospital  care - 15 minutes      Before making these care recommendations, I personally reviewed the hospitalization record, including notes, laboratory & diagnostic data and current medications, and    examined the patient at the bedside (circumstances permitting) before making care recommendations.      Total minutes: 15      Lindsey Huynh, CNS   Diabetes Clinical Nurse Specialist   [facility]   Access via [employer]

## 2019-12-04 NOTE — Progress Notes (Signed)
Vascular:    POD #6 after left leg bypass. Incisions OK, graft open.    DC planning in process. She needs to follow up with me 2 weeks post op for staple removal.

## 2019-12-04 NOTE — Progress Notes (Signed)
Bedside and Verbal shift change report given to [place] (oncoming nurse) by Aniceto Boss RN (offgoing nurse). Report included the following information SBAR, Kardex and MAR.

## 2019-12-04 NOTE — Progress Notes (Signed)
 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  The extrapolated true trough is approximately 10 mcg/mL based on the patient's known kinetics.       Goal trough: 15 - 20 mcg/mL     Plan: Change to 1250 mg IV q16h for a predicted trough ~16 mcg/ml . Pharmacy will continue to monitor this patient daily for changes in clinical status and renal function.

## 2019-12-04 NOTE — Progress Notes (Signed)
Progress Notes by [name], DPM at 12/04/19 1316                Author: [name], DPM  Service: Podiatry  Author Type: Physician       Filed: 12/04/19 1321  Date of Service: 12/04/19 1316  Status: Signed          Editor: [name], DPM (Physician)                                                                     Francoise Schaumann, DPM - Despina Pole. 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 Pt is s/p SFA to peroneal bypass (vascular surgery  following), diabetes with ulcer, peripheral vascular ds      Discussed with vascular surgery      Pt evaluated and tx. Dressing consisting of betadine/dsd applied      Discussed risks of toe amputation, biopsy, such as need for further amputation/slow wound healing and benefits such as treatment of ulceration, osteomyelitis. Patient electing for surgical excision of infected bone via left hallux amputation versus transmetatarsal  amputation with tendon balancing; and tx IV antibiotic treatment (per ID )       Pt scheduled for OR tomorrow afternoon (5pm-5:30pm)      Disposition: pending healing of surgical site      Do not hesitate to contact us via [employer] or phone (personal [phone]) with any questions.      Subjective:   Pt complains of wound to left 1, 2 toes.  Previous tx include none.  negative for fever, chills, nausea, vomiting, chest pain, shortness of breath.        HPI: relates improved pain today      ROS:   Consitutional: no weight loss, night sweats, fatigue / malaise / lethargy.   Musculoskeletal: no joint / extremity pain, misalignment, stiffness, decreased ROM, crepitus.   Integument: No pruritis, rashes,  lesions, left toe wounds.   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  Neuromuscular Blockers, Steroidal  Other (comments)             GI Upset        History reviewed. No pertinent family history.    @HXMEDICAL @     Past Surgical History:         Procedure  Laterality  Date          ?  HX COLONOSCOPY         ?  HX ORTHOPAEDIC    06-30-10          back surgery (tumor removed)          ?  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  Smoking status:  Former Smoker              Years:  10.00         ?  Smokeless tobacco:  Never Used       Substance Use Topics         ?  Alcohol use:  Yes              Alcohol/week:  5.0 standard drinks              Types:  6 Cans of beer per week            Social History          Substance and Sexual Activity        Alcohol Use  Yes         ?  Alcohol/week:  5.0 standard drinks         ?  Types:  6 Cans of beer per week          Social History          Substance and Sexual Activity        Drug Use  No           Social History          Tobacco Use        Smoking Status  Former Smoker         ?  Years:  10.00        Smokeless Tobacco  Never Used          Current Facility-Administered Medications          Medication  Dose  Route  Frequency           ?  Vanc trough due today 3/17 @ 20:00     Other  ONCE     ?  cefepime (MAXIPIME) 2 g in 0.9% sodium chloride (MBP/ADV) 100 mL MBP   2 g  IntraVENous  Q8H     ?  insulin glargine (LANTUS) injection 12 Units   12 Units  SubCUTAneous  QHS     ?  acetaminophen (TYLENOL) tablet 650 mg   650 mg  Oral  Q6H           ?  gabapentin (NEURONTIN) capsule 300 mg   300 mg  Oral  QHS           ?  gabapentin (NEURONTIN) capsule 100 mg   100 mg  Oral  DAILY     ?  Vancomycin- Pharmacy to Dose     Other  Rx Dosing/Monitoring     ?  metroNIDAZOLE (FLAGYL) IVPB premix 500 mg   500 mg  IntraVENous  Q12H      ?  vancomycin (VANCOCIN) 1,000 mg in 0.9% sodium chloride 250 mL (VIAL-MATE)   1,000 mg  IntraVENous  Q18H     ?  sodium chloride (NS) flush 5-40 mL   5-40 mL  IntraVENous  Q8H     ?  sodium chloride (NS) flush 5-40 mL   5-40 mL  IntraVENous  PRN     ?  sodium phosphate (FLEET'S) enema 1 Enema   1 Enema  Rectal  PRN     ?  oxyCODONE IR (ROXICODONE) tablet 5 mg   5 mg  Oral  Q4H PRN     ?  losartan (COZAAR) tablet 100 mg   100 mg  Oral  DAILY     ?  docusate sodium (COLACE) capsule 100 mg   100 mg  Oral  BID     ?  senna (SENOKOT) tablet 8.6 mg   1 Tab  Oral  DAILY     ?  polyethylene glycol (MIRALAX) packet 17 g   17 g  Oral  DAILY PRN     ?  bisacodyL (DULCOLAX) suppository 10 mg   10 mg  Rectal  DAILY PRN     ?  hydroCHLOROthiazide (HYDRODIURIL) tablet 25 mg   25 mg  Oral  DAILY     ?  sodium chloride (NS) flush 5-40 mL   5-40 mL  IntraVENous  Q8H     ?  sodium chloride (NS) flush 5-40 mL   5-40 mL  IntraVENous  PRN     ?  acetaminophen (TYLENOL) suppository 650 mg   650 mg  Rectal  Q6H PRN     ?  promethazine (PHENERGAN) tablet 12.5 mg   12.5 mg  Oral  Q6H PRN          Or           ?  ondansetron (ZOFRAN) injection 4 mg   4 mg  IntraVENous  Q6H PRN     ?  glucose chewable tablet 16 g   4 Tab  Oral  PRN           ?  dextrose (D50W) injection syrg 12.5-25 g   25-50 mL  IntraVENous  PRN           ?  glucagon (GLUCAGEN) injection 1 mg   1 mg  IntraMUSCular  PRN     ?  insulin lispro (HUMALOG) injection     SubCUTAneous  AC&HS     ?  aspirin delayed-release tablet 81 mg   81 mg  Oral  DAILY     ?  pantoprazole (PROTONIX) tablet 40 mg   40 mg  Oral  ACB     ?  amLODIPine (NORVASC) tablet 10 mg   10 mg  Oral  DAILY           ?  enoxaparin (LOVENOX) injection 40 mg   40 mg  SubCUTAneous  Q24H            Objective:   Vitals:    Patient Vitals for the past 12 hrs:            BP  Temp  Pulse  Resp  SpO2            12/04/19 0836  (!) 150/71  98.3 ??F (36.8 ??C)  81  16  98 %            12/04/19 0450  124/69  97.8 ??F (36.6 ??C)   74  16  98 %           Vascular:  B/L LE   DP 0/4; PT 0/4   capillary fill time brisk, pitting edema is present, skin temperature is cool, varicosities are present.      Dermatological:   Nails are thickened, discolored, painful to palpation, 65mm thick, with subungual debris.              There is maceration of the interspaces of the feet left foot.  No focal hyperkeratosis      Wound: 1, 2    Location: left distal hallux and 2nd toe   Margins: flush, demarcating   Drainage: none   Odor: none   Wound base: necrotic, dry   Lymphangitic streaking? No.   Undermining? No.   Sinus tracts? No.   Exposed bone? No.   Subcutaneous crepitation on palpation? No.            Neurological:   DTR are present, protective sensation per 5.07 Semmes Weinstein monofilament is absent, patient is AAOx3, mood is normal. Epicritic sensation is intact.      Orthopedic:   B/L LE are symmetric, ROM of ankle, STJ, 1st MTPJ is limited, MMT 5 out of 5 for B/L LE.  No pedal amputations noted      Constitutional: Pt is a well developed elderly female.            Imaging:   CT scan negative for osteomyelitis   MRI showing osteomyelitis distal tuft left hallux      Labs:     Recent Labs           12/04/19   0457     WBC  5.1     CREA  0.87     BUN  13     HGB  7.8*     HCT  22.6*     NA  138     K  3.2*     CL  102     CO2  25        GLU  70

## 2019-12-04 NOTE — Progress Notes (Deleted)
Progress Notes by Hollice Espy, NP at 12/04/19 [id number]                Author: Hollice Espy, NP  Service: Nurse Practitioner  Author Type: Acute Care Nurse Practitioner       Filed: 12/04/19 0915  Date of Service: 12/04/19 0903  Status: Cosign Needed Addendum          Editor: Hollice Espy, NP (Acute Care Nurse Practitioner)       Related Notes: Original Note by Hollice Espy, NP (Acute Care Nurse Practitioner) filed at  12/04/19 [phone]          Cosign Required: Yes                    [facility]                                                                                              Hospitalist Progress Note   Hollice Espy, NP   Answering service: [phone] OR [phone] from in house phone            Date of Service:  12/04/2019   NAME:  Lindsey Huynh   DOB:  71-22-1952   MRN:  [id number]           Admission Summary:        From H&P 11/23/2019:   "Ghina Richardsonis a 69 y.o.femalewith past medical history of diabetes, insulin-dependent, GERD and hypertension comes for return of left foot pain and swelling.   Patient is awake, alert and oriented able to answer my question follow my request, data also obtained in the ED staff and extensive chart review. As per collective reports, patientleft  great toe pain which associated darkening of the skin on the distal end of the toe and swelling of the left foot the medial aspect of the Of the last 1 week and has been progressively getting worse. The only thing that he remembers  was fungal nail cream. She reported since her left second toe is in contact with the first toe and that pain he also became slightly cellulitic, she also has some dorsal swelling of the foot as well. This all progress from being mildly achy to  significant tender and swollen last 1 week and she came today because the pain was unbearable, 10/10, getting from her toe to her foot and made her ambulation significantly difficult.  Although she continues to have pain at rest. She has not seek  medical help for that until today, did not remember any trauma or injury to her feet. She is hemodynamically otherwise stable in the ED on her own. Patient was hemodynamically stable in the ED, and work revealed hypokalemia leukocytosis and x-ray  the foot was without any acute abnormality. Given concerns for dry gangrene and cellulitis, she was started on IV vancomycin. Patient has no other medical complaints. 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 States that she is feeling much better today. With gabapentin and scheduled tylenol, pain has been better controlled.      S/p fem- pop bypass of left leg (03/11)- Four surgical incisions noted to left leg with staples that are intact. Incisions are clean and dry. No signs of infections noted. Dressing noted to left  foot.   States that she would like to speak with podiatry again. She feels that she wants to proceed with surgery if able to. She feels that 6 weeks of antibiotics with the possibly of amputation anyways, is too much for her.    Dr. Duwaine Maxin notified through [facility] that patient would like to have discussion with her.    No overnight events. No new complaints.      Assessment & Plan:          Left great toe Osteomyelitis    - MRI LLE: 1. First distal phalanx osteomyelitis.   2. No abscess.   - IV antibiotics   - Vascular surgery following- Patient to follow up in 2 weeks post- op for staple removal.    - Consult ID for antibiotic recommendations    - Podiatry following       LLE pain:dry gangreneofleft 2nd toe cellulitic changes/Left popliteal occlusion   -s/p left fem-pop bypass (03/11)   -CT LLE (03/06) NL   -Elevated Sed rate and CRP    -BC (03/06) NGTD    -Pain control   - Podiatryfollowing    - MRI completed      Anemia    Stable, No sign of bleeding noted.    - hgb 7.8 today, drop from 8.6 yesterday. No signs of active bleeding noted.    - Monitor labs and transfuse if hgb <7.0       DMII:hold metformin while IP   -Cont basal insulin, SSI   -Diabetic diet   -a1c 8.1   - DM following      HTN   -Controlled   -Cont home meds       GERD   -Cont PPI      Hypokalemia   -potassium 3.2   - Replaced    - Monitor and replace as needed.      Code status: Full    DVT prophylaxis: Lovenox      Care Plan discussed with: Patient/Family, Nurse and Case Manager   Anticipated Disposition: SNF/LTC   Anticipated Discharge: 24 hours to 48 hours           Hospital Problems   Date Reviewed:  05/31/2018                         Codes  Class  Noted  POA              Gangrene ([place])  ICD-10-CM: [id number]   ICD-9-CM: 785.4    11/23/2019  Unknown                               Review of Systems:     A comprehensive review of systems was negative except for that written in the HPI.            Vital Signs:      Last 24hrs VS reviewed since prior progress note. Most recent are:  Visit Vitals      BP  (!) 150/71 (BP 1 Location: Left upper arm, BP Patient Position: At rest)     Pulse  81     Temp  98.3 F (36.8 C)     Resp  16     Ht  5\' 5"  (1.651 m)     Wt  76.9 kg (169 lb 8 oz)     SpO2  98%        BMI  28.21 kg/m           No intake or output data in the 24 hours ending 12/04/19 0903         Physical Examination:                     Constitutional:   No acute distress, cooperative, pleasant, answers questions     ENT:   Oral mucosa moist, oropharynx benign.      Resp:   CTA bilaterally. No wheezing/rhonchi/rales. No accessory muscle use     CV:   Regular rhythm, normal rate, no murmurs, gallops, rubs         GI:   Soft, non distended, non tender. normoactive bowel sounds         Musculoskeletal:   No edema, warm, 2+ pulses throughout         Neurologic:   Moves all  extremities.  AAOx3, CN II-XII reviewed, follows commands       Psych:  Good insight, Not anxious nor agitated.    Skin:Good turgor, no rashes or ulcers and Four left leg surgical incisions noted, staples intact. No signs of infection noted                            Data Review:      Review and/or order of clinical lab test   Review and/or order of tests in the medicine section of CPT           Labs:          Recent Labs            12/04/19   0457  12/03/19   0543     WBC  5.1  7.0     HGB  7.8*  8.6*     HCT  22.6*  24.8*         PLT  344  381          Recent Labs             12/04/19   0457  12/03/19   0543  12/02/19   0540     NA  138  137  134*     K  3.2*  3.6  3.6     CL  102  102  102     CO2  25  25  23      BUN  13  14  18      CREA  0.87  0.94  0.98     GLU  70  80  108*          CA  9.1  9.9  9.6        No results for input(s): ALT, AP, TBIL, TBILI, TP, ALB, GLOB, GGT, AML, LPSE in the last 72 hours.      No lab exists for component: SGOT, GPT, AMYP, HLPSE   No results for input(s): INR, PTP,  APTT, INREXT in the last 72 hours.    No results for input(s): FE, TIBC, PSAT, FERR in the last 72 hours.    No results found for: FOL, RBCF    No results for input(s): PH, PCO2, PO2 in the last 72 hours.   No results for input(s): CPK, CKNDX, TROIQ in the last 72 hours.      No lab exists for component: CPKMB     Lab Results         Component  Value  Date/Time            Cholesterol, total  219 (H)  10/30/2018 10:27 AM       HDL Cholesterol  63  10/30/2018 10:27 AM       LDL, calculated  130 (H)  10/30/2018 10:27 AM       Triglyceride  131  10/30/2018 10:27 AM            CHOL/HDL Ratio  3.4  12/26/2007 09:12 AM          Lab Results         Component  Value  Date/Time            Glucose (POC)  97  12/04/2019 06:58 AM       Glucose (POC)  163 (H)  12/03/2019 09:28 PM       Glucose (POC)  163 (H)  12/03/2019 04:18 PM       Glucose (POC)  98  12/03/2019 12:01 PM            Glucose (POC)  90  12/03/2019 06:44 AM         No results found for: COLOR, APPRN, SPGRU, REFSG, PHU, PROTU, GLUCU, KETU, BILU, UROU, NITU, LEUKU, GLUKE, EPSU, BACTU, WBCU, RBCU, CASTS, UCRY           Medications Reviewed:          Current Facility-Administered Medications          Medication  Dose  Route  Frequency           ?  acetaminophen (TYLENOL) tablet 650 mg   650 mg  Oral  Q6H     ?  gabapentin (NEURONTIN) capsule 300 mg   300 mg  Oral  QHS     ?  gabapentin (NEURONTIN) capsule 100 mg   100 mg  Oral  DAILY     ?  Vancomycin- Pharmacy to Dose     Other  Rx Dosing/Monitoring     ?  metroNIDAZOLE (FLAGYL) IVPB premix 500 mg   500 mg  IntraVENous  Q12H     ?  vancomycin (VANCOCIN) 1,000 mg in 0.9% sodium chloride 250 mL (VIAL-MATE)   1,000 mg  IntraVENous  Q18H     ?  cefepime (MAXIPIME) 2 g in 0.9% sodium chloride (MBP/ADV) 100 mL MBP   2 g  IntraVENous  Q12H     ?  sodium chloride (NS) flush 5-40 mL   5-40 mL  IntraVENous  Q8H     ?  sodium chloride (NS) flush 5-40 mL   5-40 mL  IntraVENous  PRN     ?  sodium phosphate (FLEET'S) enema 1 Enema   1 Enema  Rectal  PRN     ?  oxyCODONE IR (ROXICODONE) tablet 5 mg   5 mg  Oral  Q4H PRN     ?  insulin glargine (LANTUS) injection 16 Units   16 Units  SubCUTAneous  QHS     ?  losartan (COZAAR) tablet 100 mg   100 mg  Oral  DAILY     ?  docusate sodium (COLACE) capsule 100 mg   100 mg  Oral  BID     ?  senna (SENOKOT) tablet 8.6 mg   1 Tab  Oral  DAILY     ?  polyethylene glycol (MIRALAX) packet 17 g   17 g  Oral  DAILY PRN     ?  bisacodyL (DULCOLAX) suppository 10 mg   10 mg  Rectal  DAILY PRN     ?  hydroCHLOROthiazide (HYDRODIURIL) tablet 25 mg   25 mg  Oral  DAILY     ?  sodium chloride (NS) flush 5-40 mL   5-40 mL  IntraVENous  Q8H     ?  sodium chloride (NS) flush 5-40 mL   5-40 mL  IntraVENous  PRN     ?  acetaminophen (TYLENOL) suppository 650 mg   650 mg  Rectal  Q6H PRN     ?  promethazine (PHENERGAN) tablet 12.5 mg   12.5 mg  Oral  Q6H PRN          Or           ?  ondansetron (ZOFRAN) injection 4 mg    4 mg  IntraVENous  Q6H PRN     ?  glucose chewable tablet 16 g   4 Tab  Oral  PRN     ?  dextrose (D50W) injection syrg 12.5-25 g   25-50 mL  IntraVENous  PRN     ?  glucagon (GLUCAGEN) injection 1 mg   1 mg  IntraMUSCular  PRN     ?  insulin lispro (HUMALOG) injection     SubCUTAneous  AC&HS     ?  aspirin delayed-release tablet 81 mg   81 mg  Oral  DAILY     ?  pantoprazole (PROTONIX) tablet 40 mg   40 mg  Oral  ACB     ?  amLODIPine (NORVASC) tablet 10 mg   10 mg  Oral  DAILY           ?  enoxaparin (LOVENOX) injection 40 mg   40 mg  SubCUTAneous  Q24H        ______________________________________________________________________   EXPECTED LENGTH OF STAY: 4d 2h   ACTUAL LENGTH OF STAY:          [place], NP

## 2019-12-04 NOTE — Progress Notes (Signed)
Problem: Mobility Impaired (Adult and Pediatric)  Goal: *Acute Goals and Plan of Care (Insert Text)  Description: FUNCTIONAL STATUS PRIOR TO ADMISSION: Patient was independent and active without use of DME.    HOME SUPPORT PRIOR TO ADMISSION: The patient lived alone with no local support.    Physical Therapy  Reassessed post-surgery 11/29/2019  1.  Patient will move from supine to sit and sit to supine , scoot up and down, and roll side to side in bed with supervision/set-up within 7 day(s).    2.  Patient will transfer from bed to chair and chair to bed with supervision/set-up using the least restrictive device within 7 day(s).  3.  Patient will perform sit to stand with supervision/set-up within 7 day(s).  4.  Patient will ambulate with supervision/set-up for 50 feet with the least restrictive device within 7 day(s).   5.  Patient will ascend/descend 4 stairs with cane and one handrail(s) with supervision/set-up within 7 day(s).   Goals remain appropriate and will continue to work toward same.      Physical Therapy Goals  Initiated 11/26/2019  1.  Patient will move from supine to sit and sit to supine , scoot up and down, and roll side to side in bed with supervision/set-up within 7 day(s).    2.  Patient will transfer from bed to chair and chair to bed with supervision/set-up using the least restrictive device within 7 day(s).  3.  Patient will perform sit to stand with supervision/set-up within 7 day(s).  4.  Patient will ambulate with supervision/set-up for 50 feet with the least restrictive device within 7 day(s).   5.  Patient will ascend/descend 4 stairs with cane and one handrail(s) with supervision/set-up within 7 day(s).     Outcome: Progressing Towards Goal   PHYSICAL THERAPY TREATMENT  Patient: Lindsey Huynh (69 y.o. 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    ASSESSMENT  Patient continues with skilled PT services and is progressing towards goals. Patient performed bed mobility and transfers and ambulated 35 ft into hallway and back to room with RW, gait belt and post-op shoe. Left up in chair with left foot elevated on trash can and pillow. Patient is possibly having further foot surgery tomorrow, possible amputation of toes.    Current Level of Function Impacting Discharge (mobility/balance): Stand by assistance for all mobility.    Other factors to consider for discharge: No assistance in the home/Independent PLOF/Motivated, A & O x 4         PLAN :  Patient continues to benefit from skilled intervention to address the above impairments.  Continue treatment per established plan of care.  to address goals.    Recommendation for discharge: (in order for the patient to meet his/her long term goals)  Therapy 3 hours per day 5-7 days per week    This discharge recommendation:  A follow-up discussion with the attending provider and/or case management is planned    IF patient discharges home will need the following DME: to be determined (TBD)       SUBJECTIVE:   Patient stated "I am not in too much pain."    OBJECTIVE DATA SUMMARY:   Critical Behavior:  Neurologic State: Alert, Appropriate for age  Orientation Level: Oriented X4  Cognition: Appropriate  decision making, Appropriate for age attention/concentration, Appropriate safety awareness, Follows commands     Functional Mobility Training:  Bed Mobility:     Supine to Sit: Supervision  Sit to Supine: (remained up in chair)           Transfers:  Sit to Stand: Stand-by assistance  Stand to Sit: Stand-by assistance        Bed to Chair: Stand-by assistance                    Balance:  Sitting: Intact  Standing: Impaired;Without support  Standing - Static: Good;Constant support  Standing - Dynamic : Fair;Constant support  Ambulation/Gait  Training:  Distance (ft): 35 Feet (ft)  Assistive Device: Walker, rolling;Gait belt;Brace/Splint(post-op (cast) shoe)  Ambulation - Level of Assistance: Stand-by assistance        Gait Abnormalities: Antalgic;Decreased step clearance;Step to gait     Left Side Weight Bearing: As tolerated  Base of Support: Narrowed;Shift to right  Stance: Left decreased  Speed/Cadence: Slow  Step Length: Right shortened;Left shortened  Swing Pattern: Left asymmetrical     Interventions: Safety awareness training          Therapeutic Exercise:  Ankle Pumps  Ham Sets  Quad Sets  Glute Sets  Scissors  Heel Slides  X 10 each every hour     Pain Rating:  3/10    Activity Tolerance:   Good    After treatment patient left in no apparent distress:   Sitting in chair, Call bell within reach, and nurse notified.    COMMUNICATION/COLLABORATION:   The patient's plan of care was discussed with: Registered nurse.     Billey Gosling   Time Calculation: 25 mins

## 2019-12-04 NOTE — Progress Notes (Signed)
 Day #3 of Cefepime  Indication:    Osteomyelitis - first distal phalanx of L great toe; dry gangrene on L 2nd toe  Current regimen:  2 g IV every 12 hours  Abx regimen: Vancomycin + cefepime + metronidazole  Recent Labs     12/04/19  0457 12/03/19  0543 12/02/19  0540   WBC 5.1 7.0 6.7   CREA 0.87 0.94 0.98   BUN 13 14 18      Est CrCl: 61.7 ml/min; UO: x 2 unmeasured occurrences  Temp (24hrs), Avg:98 F (36.7 C), Min:97.8 F (36.6 C), Max:98.3 F (36.8 C)    Cultures:   3/6 pBlood - NG, final    Plan: Change to cefepime 2 g IV every 8 hours per [facility] P&T Committee Protocol with respect to renal function (CrCl >60 ml/min).  Pharmacy will continue to monitor patient daily and will make dosage adjustments based upon changing renal function.

## 2019-12-04 NOTE — Progress Notes (Signed)
 TOC: CM spoke with hospitalist NP regarding SNF placement when stable for discharge. Noted plan for surgery tomorrow. Patient tentative plan is to get 6 weeks of IV antibiotics per MD. I/D plan pending. Referrals pending to SNF's. BLS to transport. Last rapid 3/8 negative. Will need updated covid test for placement. CM following.    RUR: 16%    Burnard Griffiths RN/CRM

## 2019-12-04 NOTE — Progress Notes (Signed)
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  The extrapolated "true" trough is approximately 10 mcg/mL based on the patient's known kinetics.       Goal trough: 15 - 20 mcg/mL     Plan: Change to 1250 mg IV q16h for a predicted trough ~16 mcg/ml . Pharmacy will continue to monitor this patient daily for changes in clinical status and renal function.

## 2019-12-04 NOTE — Progress Notes (Signed)
Francoise Schaumann, DPM - Greig Castilla K. 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 Pt is s/p SFA to peroneal bypass (vascular surgery following), diabetes with ulcer, peripheral vascular ds    Discussed with vascular surgery    Pt evaluated and tx. Dressing consisting of betadine/dsd applied    Discussed risks of toe amputation, biopsy, such as need for further amputation/slow wound healing and benefits such as treatment of ulceration, osteomyelitis. Patient electing for surgical excision of infected bone via left hallux amputation versus transmetatarsal amputation with tendon balancing; and tx IV antibiotic treatment (per ID )     Pt scheduled for OR tomorrow afternoon (5pm-5:30pm)    Disposition: pending healing of surgical site    Do not hesitate to contact us via [employer] or phone (personal [phone]) with any questions.    Subjective:  Pt complains of wound to left 1, 2 toes.  Previous tx include none.  negative for fever, chills, nausea, vomiting, chest pain, shortness of breath.      HPI: relates improved pain today    ROS:  Consitutional: no weight loss, night sweats, fatigue / malaise / lethargy.  Musculoskeletal: no joint / extremity pain, misalignment, stiffness, decreased ROM, crepitus.  Integument: No pruritis, rashes, lesions, left toe wounds.  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 Neuromuscular Blockers, Steroidal Other (comments)     GI Upset     History reviewed. No pertinent family history.   @HXMEDICAL @  Past Surgical History:   Procedure Laterality Date   ??? HX  COLONOSCOPY     ??? HX ORTHOPAEDIC  06-30-10    back surgery (tumor removed)   ??? 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 Smoking status: Former Smoker     Years: 10.00   ??? Smokeless tobacco: Never Used   Substance Use Topics   ??? Alcohol use: Yes     Alcohol/week: 5.0 standard drinks     Types: 6 Cans of beer per week       Social History     Substance and Sexual Activity   Alcohol Use Yes   ??? Alcohol/week: 5.0 standard drinks   ??? Types: 6 Cans of beer per week     Social History     Substance and Sexual Activity   Drug Use No      Social History     Tobacco Use   Smoking Status Former Smoker   ??? Years: 10.00   Smokeless Tobacco Never Used     Current Facility-Administered Medications   Medication Dose Route Frequency   ??? Vanc trough due today 3/17 @  20:00   Other ONCE   ??? cefepime (MAXIPIME) 2 g in 0.9% sodium chloride (MBP/ADV) 100 mL MBP  2 g IntraVENous Q8H   ??? insulin glargine (LANTUS) injection 12 Units  12 Units SubCUTAneous QHS   ??? acetaminophen (TYLENOL) tablet 650 mg  650 mg Oral Q6H   ??? gabapentin (NEURONTIN) capsule 300 mg  300 mg Oral QHS   ??? gabapentin (NEURONTIN) capsule 100 mg  100 mg Oral DAILY   ??? Vancomycin- Pharmacy to Dose   Other Rx Dosing/Monitoring   ??? metroNIDAZOLE (FLAGYL) IVPB premix 500 mg  500 mg IntraVENous Q12H   ??? vancomycin (VANCOCIN) 1,000 mg in 0.9% sodium chloride 250 mL (VIAL-MATE)  1,000 mg IntraVENous Q18H   ??? sodium chloride (NS) flush 5-40 mL  5-40 mL IntraVENous Q8H   ??? sodium chloride (NS) flush 5-40 mL  5-40 mL IntraVENous PRN   ??? sodium phosphate (FLEET'S) enema 1 Enema  1 Enema Rectal PRN   ??? oxyCODONE IR (ROXICODONE) tablet 5 mg  5 mg Oral Q4H PRN   ??? losartan (COZAAR) tablet 100 mg  100 mg Oral DAILY   ??? docusate sodium (COLACE) capsule 100 mg  100 mg Oral BID   ??? senna (SENOKOT) tablet 8.6 mg  1 Tab Oral DAILY   ??? polyethylene glycol (MIRALAX) packet 17 g  17 g Oral DAILY PRN   ??? bisacodyL (DULCOLAX) suppository 10 mg  10 mg  Rectal DAILY PRN   ??? hydroCHLOROthiazide (HYDRODIURIL) tablet 25 mg  25 mg Oral DAILY   ??? sodium chloride (NS) flush 5-40 mL  5-40 mL IntraVENous Q8H   ??? sodium chloride (NS) flush 5-40 mL  5-40 mL IntraVENous PRN   ??? acetaminophen (TYLENOL) suppository 650 mg  650 mg Rectal Q6H PRN   ??? promethazine (PHENERGAN) tablet 12.5 mg  12.5 mg Oral Q6H PRN    Or   ??? ondansetron (ZOFRAN) injection 4 mg  4 mg IntraVENous Q6H PRN   ??? glucose chewable tablet 16 g  4 Tab Oral PRN   ??? dextrose (D50W) injection syrg 12.5-25 g  25-50 mL IntraVENous PRN   ??? glucagon (GLUCAGEN) injection 1 mg  1 mg IntraMUSCular PRN   ??? insulin lispro (HUMALOG) injection   SubCUTAneous AC&HS   ??? aspirin delayed-release tablet 81 mg  81 mg Oral DAILY   ??? pantoprazole (PROTONIX) tablet 40 mg  40 mg Oral ACB   ??? amLODIPine (NORVASC) tablet 10 mg  10 mg Oral DAILY   ??? enoxaparin (LOVENOX) injection 40 mg  40 mg SubCUTAneous Q24H        Objective:  Vitals:   Patient Vitals for the past 12 hrs:   BP Temp Pulse Resp SpO2   12/04/19 0836 (!) 150/71 98.3 ??F (36.8 ??C) 81 16 98 %   12/04/19 0450 124/69 97.8 ??F (36.6 ??C) 74 16 98 %       Vascular:  B/L LE  DP 0/4; PT 0/4  capillary fill time brisk, pitting edema is present, skin temperature is cool, varicosities are present.    Dermatological:  Nails are thickened, discolored, painful to palpation, 36mm thick, with subungual debris.          There is maceration of the interspaces of the feet left foot.  No focal hyperkeratosis    Wound: 1, 2   Location: left distal hallux and 2nd toe  Margins: flush, demarcating  Drainage: none  Odor: none  Wound base: necrotic, dry  Lymphangitic streaking? No.  Undermining? No.  Sinus tracts? No.  Exposed bone? No.  Subcutaneous crepitation on palpation? No.        Neurological:  DTR are present, protective sensation per 5.07 Semmes Weinstein monofilament is absent, patient is AAOx3, mood is normal. Epicritic sensation is intact.    Orthopedic:  B/L LE are symmetric, ROM of  ankle, STJ, 1st MTPJ is limited, MMT 5 out of 5 for B/L LE.  No pedal amputations noted    Constitutional: Pt is a well developed elderly female.        Imaging:  CT scan negative for osteomyelitis  MRI showing osteomyelitis distal tuft left hallux    Labs:  Recent Labs     12/04/19  0457   WBC 5.1   CREA 0.87   BUN 13   HGB 7.8*   HCT 22.6*   NA 138   K 3.2*   CL 102   CO2 25   GLU 70

## 2019-12-04 NOTE — Telephone Encounter (Signed)
Spoke with pt in regards to [facility] form. Will reach out to [facility] to clarify what else is needed.

## 2019-12-04 NOTE — Progress Notes (Signed)
Problem: Mobility Impaired (Adult and Pediatric)  Goal: *Acute Goals and Plan of Care (Insert Text)  Description: FUNCTIONAL STATUS PRIOR TO ADMISSION: Patient was independent and active without use of DME.    HOME SUPPORT PRIOR TO ADMISSION: The patient lived alone with no local support.    Physical Therapy  Reassessed post-surgery 11/29/2019  1.  Patient will move from supine to sit and sit to supine , scoot up and down, and roll side to side in bed with supervision/set-up within 7 day(s).    2.  Patient will transfer from bed to chair and chair to bed with supervision/set-up using the least restrictive device within 7 day(s).  3.  Patient will perform sit to stand with supervision/set-up within 7 day(s).  4.  Patient will ambulate with supervision/set-up for 50 feet with the least restrictive device within 7 day(s).   5.  Patient will ascend/descend 4 stairs with cane and one handrail(s) with supervision/set-up within 7 day(s).   Goals remain appropriate and will continue to work toward same.      Physical Therapy Goals  Initiated 11/26/2019  1.  Patient will move from supine to sit and sit to supine , scoot up and down, and roll side to side in bed with supervision/set-up within 7 day(s).    2.  Patient will transfer from bed to chair and chair to bed with supervision/set-up using the least restrictive device within 7 day(s).  3.  Patient will perform sit to stand with supervision/set-up within 7 day(s).  4.  Patient will ambulate with supervision/set-up for 50 feet with the least restrictive device within 7 day(s).   5.  Patient will ascend/descend 4 stairs with cane and one handrail(s) with supervision/set-up within 7 day(s).     Outcome: Progressing Towards Goal   PHYSICAL THERAPY TREATMENT  Patient: Lindsey Huynh [id number] y.o. female)  Date: 12/04/2019  Diagnosis: Gangrene ([place]) [I96] <principal problem not specified>  Procedure(s) (LRB):  LEFT FEMORAL-TIBIAL BYPASS WITH VEIN (Left) 6 Days Post-Op  Precautions:  Fall, WBAT  Chart, physical therapy assessment, plan of care and goals were reviewed.    ASSESSMENT  Patient continues with skilled PT services and is progressing towards goals. Patient performed bed mobility and transfers and ambulated 35 ft into hallway and back to room with RW, gait belt and post-op shoe. Left up in chair with left foot elevated on trash can and pillow. Patient is possibly having further foot surgery tomorrow, possible amputation of toes.    Current Level of Function Impacting Discharge (mobility/balance): Stand by assistance for all mobility.    Other factors to consider for discharge: No assistance in the home/Independent PLOF/Motivated, A & O x 4         PLAN :  Patient continues to benefit from skilled intervention to address the above impairments.  Continue treatment per established plan of care.  to address goals.    Recommendation for discharge: (in order for the patient to meet his/her long term goals)  Therapy 3 hours per day 5-7 days per week    This discharge recommendation:  A follow-up discussion with the attending provider and/or case management is planned    IF patient discharges home will need the following DME: to be determined (TBD)       SUBJECTIVE:   Patient stated ???I am not in too much pain.???    OBJECTIVE DATA SUMMARY:   Critical Behavior:  Neurologic State: Alert, Appropriate for age  Orientation Level: Oriented X4  Cognition: Appropriate  decision making, Appropriate for age attention/concentration, Appropriate safety awareness, Follows commands     Functional Mobility Training:  Bed Mobility:     Supine to Sit: Supervision  Sit to Supine: (remained up in chair)           Transfers:  Sit to Stand: Stand-by assistance  Stand to Sit: Stand-by assistance        Bed to Chair: Stand-by assistance                    Balance:  Sitting: Intact  Standing: Impaired;Without support  Standing - Static: Good;Constant support  Standing - Dynamic : Fair;Constant support  Ambulation/Gait  Training:  Distance (ft): 35 Feet (ft)  Assistive Device: Walker, rolling;Gait belt;Brace/Splint(post-op (cast) shoe)  Ambulation - Level of Assistance: Stand-by assistance        Gait Abnormalities: Antalgic;Decreased step clearance;Step to gait     Left Side Weight Bearing: As tolerated  Base of Support: Narrowed;Shift to right  Stance: Left decreased  Speed/Cadence: Slow  Step Length: Right shortened;Left shortened  Swing Pattern: Left asymmetrical     Interventions: Safety awareness training          Therapeutic Exercise:  Ankle Pumps  Ham Sets  Quad Sets  Glute Sets  Scissors  Heel Slides  X 10 each every hour     Pain Rating:  3/10    Activity Tolerance:   Good    After treatment patient left in no apparent distress:   Sitting in chair, Call bell within reach, and nurse notified.    COMMUNICATION/COLLABORATION:   The patient???s plan of care was discussed with: Registered nurse.     Hilda Lias   Time Calculation: 25 mins

## 2019-12-04 NOTE — Progress Notes (Signed)
TOC: CM spoke with hospitalist NP regarding SNF placement when stable for discharge. Noted plan for surgery tomorrow. Patient tentative plan is to get 6 weeks of IV antibiotics per MD. I/D plan pending. Referrals pending to SNF's. BLS to transport. Last rapid 3/8 negative. Will need updated covid test for placement. CM following.    RUR: 16%    Nathanial Millman RN/CRM  ??

## 2019-12-04 NOTE — Progress Notes (Signed)
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  Pharmacy will continue to monitor patient daily and will make dosage adjustments based upon changing renal function.

## 2019-12-05 ENCOUNTER — Inpatient Hospital Stay: Admit: 2019-12-06 | Payer: Medicare (Managed Care) | Primary: Family

## 2019-12-05 ENCOUNTER — Inpatient Hospital Stay: Admit: 2019-12-05 | Payer: Medicare (Managed Care) | Primary: Family

## 2019-12-05 LAB — GLUCOSE, POC
Glucose (POC): 135 mg/dL — ABNORMAL HIGH (ref 65–100)
Glucose (POC): 99 mg/dL (ref 65–100)
Glucose (POC): 90 mg/dL (ref 65–100)
Glucose (POC): 80 mg/dL (ref 65–100)

## 2019-12-05 LAB — METABOLIC PANEL, BASIC
Anion gap: 5 mmol/L (ref 5–15)
BUN/Creatinine ratio: 18 (ref 12–20)
BUN: 14 MG/DL (ref 6–20)
CO2: 27 mmol/L (ref 21–32)
Calcium: 9.2 MG/DL (ref 8.5–10.1)
Chloride: 104 mmol/L (ref 97–108)
Creatinine: 0.77 MG/DL (ref 0.55–1.02)
GFR est AA: >60 mL/min/{1.73_m2} (ref >60)
GFR est non-AA: >60 mL/min/{1.73_m2} (ref >60)
Glucose: 77 mg/dL (ref 65–100)
Potassium: 3.2 mmol/L — ABNORMAL LOW (ref 3.5–5.1)
Sodium: 136 mmol/L (ref 136–145)

## 2019-12-05 LAB — CBC WITH AUTOMATED DIFF
ABS. BASOPHILS: 0.1 10*3/uL (ref 0.0–0.1)
ABS. EOSINOPHILS: 0.8 10*3/uL — ABNORMAL HIGH (ref 0.0–0.4)
ABS. IMM. GRANS.: 0.1 10*3/uL — ABNORMAL HIGH (ref 0.00–0.04)
ABS. LYMPHOCYTES: 0.8 10*3/uL (ref 0.8–3.5)
ABS. MONOCYTES: 0.5 10*3/uL (ref 0.0–1.0)
ABS. NEUTROPHILS: 3.1 10*3/uL (ref 1.8–8.0)
ABSOLUTE NRBC: 0 10*3/uL (ref 0.00–0.01)
BASOPHILS: 1 % (ref 0–1)
EOSINOPHILS: 15 % — ABNORMAL HIGH (ref 0–7)
HCT: 22.1 % — ABNORMAL LOW (ref 35.0–47.0)
HGB: 7.5 g/dL — ABNORMAL LOW (ref 11.5–16.0)
IMMATURE GRANULOCYTES: 1 % — ABNORMAL HIGH (ref 0.0–0.5)
LYMPHOCYTES: 14 % (ref 12–49)
MCH: 31 PG (ref 26.0–34.0)
MCHC: 33.9 g/dL (ref 30.0–36.5)
MCV: 91.3 FL (ref 80.0–99.0)
MONOCYTES: 10 % (ref 5–13)
MPV: 10.1 FL (ref 8.9–12.9)
NEUTROPHILS: 59 % (ref 32–75)
NRBC: 0 PER 100 WBC
PLATELET: 299 10*3/uL (ref 150–400)
RBC: 2.42 M/uL — ABNORMAL LOW (ref 3.80–5.20)
RDW: 13.2 % (ref 11.5–14.5)
WBC: 5.4 10*3/uL (ref 3.6–11.0)

## 2019-12-05 LAB — CBC WITH AUTO DIFFERENTIAL
Basophils %: 1 % (ref 0–1)
Basophils Absolute: 0.1 10*3/uL (ref 0.0–0.1)
Eosinophils %: 15 % — ABNORMAL HIGH (ref 0–7)
Eosinophils Absolute: 0.8 10*3/uL — ABNORMAL HIGH (ref 0.0–0.4)
Granulocyte Absolute Count: 0.1 10*3/uL — ABNORMAL HIGH (ref 0.00–0.04)
Hematocrit: 22.1 % — ABNORMAL LOW (ref 35.0–47.0)
Hemoglobin: 7.5 g/dL — ABNORMAL LOW (ref 11.5–16.0)
Immature Granulocytes %: 1 % — ABNORMAL HIGH (ref 0.0–0.5)
Lymphocytes %: 14 % (ref 12–49)
Lymphocytes Absolute: 0.8 10*3/uL (ref 0.8–3.5)
MCH: 31 PG (ref 26.0–34.0)
MCHC: 33.9 g/dL (ref 30.0–36.5)
MCV: 91.3 FL (ref 80.0–99.0)
MPV: 10.1 FL (ref 8.9–12.9)
Monocytes %: 10 % (ref 5–13)
Monocytes Absolute: 0.5 10*3/uL (ref 0.0–1.0)
NRBC Absolute: 0 10*3/uL (ref 0.00–0.01)
Neutrophils %: 59 % (ref 32–75)
Neutrophils Absolute: 3.1 10*3/uL (ref 1.8–8.0)
Nucleated RBCs: 0 PER 100 WBC
Platelets: 299 10*3/uL (ref 150–400)
RBC: 2.42 M/uL — ABNORMAL LOW (ref 3.80–5.20)
RDW: 13.2 % (ref 11.5–14.5)
WBC: 5.4 10*3/uL (ref 3.6–11.0)

## 2019-12-05 LAB — POCT GLUCOSE
POC Glucose: 135 mg/dL — ABNORMAL HIGH (ref 65–100)
POC Glucose: 99 mg/dL (ref 65–100)
POC Glucose: 90 mg/dL (ref 65–100)
POC Glucose: 80 mg/dL (ref 65–100)

## 2019-12-05 LAB — BASIC METABOLIC PANEL
Anion Gap: 5 mmol/L (ref 5–15)
BUN/Creatinine Ratio: 18 (ref 12–20)
BUN: 14 MG/DL (ref 6–20)
CO2: 27 mmol/L (ref 21–32)
Calcium: 9.2 MG/DL (ref 8.5–10.1)
Chloride: 104 mmol/L (ref 97–108)
Creatinine: 0.77 MG/DL (ref 0.55–1.02)
GFR African American: >60 mL/min/{1.73_m2} (ref >60)
Glucose: 77 mg/dL (ref 65–100)
Potassium: 3.2 mmol/L — ABNORMAL LOW (ref 3.5–5.1)
Sodium: 136 mmol/L (ref 136–145)
eGFR NON-AA: >60 mL/min/{1.73_m2} (ref >60)

## 2019-12-05 MED ORDER — FENTANYL CITRATE (PF) 50 MCG/ML IJ SOLN
50 mcg/mL | INTRAMUSCULAR | Status: DC | PRN
Start: 2019-12-05 — End: 2019-12-05
  Administered 2019-12-05 (×2): via INTRAVENOUS

## 2019-12-05 MED ORDER — SODIUM CHLORIDE 0.9 % IJ SYRG
Freq: Three times a day (TID) | INTRAMUSCULAR | Status: DC
Start: 2019-12-05 — End: 2019-12-05
  Administered 2019-12-05: 22:00:00 via INTRAVENOUS

## 2019-12-05 MED ORDER — DEXAMETHASONE SODIUM PHOSPHATE 4 MG/ML IJ SOLN
4 mg/mL | INTRAMUSCULAR | Status: DC | PRN
Start: 2019-12-05 — End: 2019-12-05
  Administered 2019-12-05: 23:00:00 via INTRAVENOUS

## 2019-12-05 MED ORDER — MIDAZOLAM 1 MG/ML IJ SOLN
1 mg/mL | INTRAMUSCULAR | Status: DC | PRN
Start: 2019-12-05 — End: 2019-12-05

## 2019-12-05 MED ORDER — PROPOFOL 10 MG/ML IV EMUL
10 mg/mL | INTRAVENOUS | Status: DC | PRN
Start: 2019-12-05 — End: 2019-12-05
  Administered 2019-12-05 (×2): via INTRAVENOUS

## 2019-12-05 MED ORDER — GLYCOPYRROLATE(PF) 0.4 MG/2 ML (0.2 MG/ML) IN STERILE WATER IV SYRINGE
0.4 mg/2 mL (0.2 mg/mL) | INTRAVENOUS | Status: AC
Start: 2019-12-05 — End: ?

## 2019-12-05 MED ORDER — PHENYLEPHRINE IN 0.9 % SODIUM CL (40 MCG/ML) IV SYRINGE
0.4 mg/10 mL (40 mcg/mL) | INTRAVENOUS | Status: DC | PRN
Start: 2019-12-05 — End: 2019-12-05
  Administered 2019-12-05 – 2019-12-06 (×5): via INTRAVENOUS

## 2019-12-05 MED ORDER — LIDOCAINE (PF) 10 MG/ML (1 %) IJ SOLN
10 mg/mL (1 %) | INTRAMUSCULAR | Status: DC | PRN
Start: 2019-12-05 — End: 2019-12-05

## 2019-12-05 MED ORDER — BUPIVACAINE (PF) 0.25 % (2.5 MG/ML) IJ SOLN
0.25 % (2.5 mg/mL) | INTRAMUSCULAR | Status: DC | PRN
Start: 2019-12-05 — End: 2019-12-05
  Administered 2019-12-05: via SUBCUTANEOUS

## 2019-12-05 MED ORDER — ONDANSETRON (PF) 4 MG/2 ML INJECTION
4 mg/2 mL | INTRAMUSCULAR | Status: DC | PRN
Start: 2019-12-05 — End: 2019-12-05
  Administered 2019-12-05: 23:00:00 via INTRAVENOUS

## 2019-12-05 MED ORDER — MIDAZOLAM 1 MG/ML IJ SOLN
1 mg/mL | INTRAMUSCULAR | Status: AC
Start: 2019-12-05 — End: ?

## 2019-12-05 MED ORDER — HYDROMORPHONE (PF) 2 MG/ML IJ SOLN
2 mg/mL | INTRAMUSCULAR | Status: AC
Start: 2019-12-05 — End: ?

## 2019-12-05 MED ORDER — SODIUM CHLORIDE 0.9 % IV
INTRAVENOUS | Status: DC
Start: 2019-12-05 — End: 2019-12-05
  Administered 2019-12-05: 22:00:00 via INTRAVENOUS

## 2019-12-05 MED ORDER — FENTANYL CITRATE (PF) 50 MCG/ML IJ SOLN
50 mcg/mL | INTRAMUSCULAR | Status: AC
Start: 2019-12-05 — End: ?

## 2019-12-05 MED ORDER — LACTATED RINGERS IV
INTRAVENOUS | Status: DC
Start: 2019-12-05 — End: 2019-12-05
  Administered 2019-12-05: 22:00:00 via INTRAVENOUS

## 2019-12-05 MED ORDER — HYDROMORPHONE (PF) 2 MG/ML IJ SOLN
2 mg/mL | INTRAMUSCULAR | Status: DC | PRN
Start: 2019-12-05 — End: 2019-12-05
  Administered 2019-12-05: via INTRAVENOUS

## 2019-12-05 MED ORDER — SODIUM CHLORIDE 0.9 % IJ SYRG
INTRAMUSCULAR | Status: DC | PRN
Start: 2019-12-05 — End: 2019-12-05

## 2019-12-05 MED ORDER — LIDOCAINE (PF) 20 MG/ML (2 %) IJ SOLN
20 mg/mL (2 %) | INTRAMUSCULAR | Status: AC
Start: 2019-12-05 — End: ?

## 2019-12-05 MED ORDER — VANCOMYCIN IN 0.9 % SODIUM CHLORIDE 1.25 GRAM/250 ML IV
1.25 gram/250 mL | INTRAVENOUS | Status: DC
Start: 2019-12-05 — End: 2019-12-09
  Administered 2019-12-05 – 2019-12-09 (×7): via INTRAVENOUS

## 2019-12-05 MED ORDER — BUPIVACAINE (PF) 0.25 % (2.5 MG/ML) IJ SOLN
0.25 % (2.5 mg/mL) | INTRAMUSCULAR | Status: AC
Start: 2019-12-05 — End: ?

## 2019-12-05 MED ORDER — FENTANYL CITRATE (PF) 50 MCG/ML IJ SOLN
50 mcg/mL | INTRAMUSCULAR | Status: DC | PRN
Start: 2019-12-05 — End: 2019-12-05

## 2019-12-05 MED ORDER — POTASSIUM CHLORIDE SR 10 MEQ TAB
10 mEq | Freq: Two times a day (BID) | ORAL | Status: AC
Start: 2019-12-05 — End: 2019-12-06
  Administered 2019-12-05: 17:00:00 via ORAL

## 2019-12-05 MED FILL — ACETAMINOPHEN 325 MG TABLET: 325 mg | ORAL | Qty: 2

## 2019-12-05 MED FILL — XYLOCAINE-MPF 20 MG/ML (2 %) INJECTION SOLUTION: 20 mg/mL (2 %) | INTRAMUSCULAR | Qty: 5

## 2019-12-05 MED FILL — VANCOMYCIN IN 0.9 % SODIUM CHLORIDE 1.25 GRAM/250 ML IV: 1.25 gram/250 mL | INTRAVENOUS | Qty: 250

## 2019-12-05 MED FILL — OXYCODONE 5 MG TAB: 5 mg | ORAL | Qty: 1

## 2019-12-05 MED FILL — LACTATED RINGERS IV: INTRAVENOUS | Qty: 1000

## 2019-12-05 MED FILL — NORMAL SALINE FLUSH 0.9 % INJECTION SYRINGE: INTRAMUSCULAR | Qty: 40

## 2019-12-05 MED FILL — CEFEPIME 2 GRAM SOLUTION FOR INJECTION: 2 gram | INTRAMUSCULAR | Qty: 2

## 2019-12-05 MED FILL — LOSARTAN 50 MG TAB: 50 mg | ORAL | Qty: 2

## 2019-12-05 MED FILL — PANTOPRAZOLE 40 MG TAB, DELAYED RELEASE: 40 mg | ORAL | Qty: 1

## 2019-12-05 MED FILL — BUPIVACAINE (PF) 0.25 % (2.5 MG/ML) IJ SOLN: 0.25 % (2.5 mg/mL) | INTRAMUSCULAR | Qty: 30

## 2019-12-05 MED FILL — MIDAZOLAM 1 MG/ML IJ SOLN: 1 mg/mL | INTRAMUSCULAR | Qty: 2

## 2019-12-05 MED FILL — K-TAB 10 MEQ TABLET,EXTENDED RELEASE: 10 mEq | ORAL | Qty: 4

## 2019-12-05 MED FILL — HYDROCHLOROTHIAZIDE 25 MG TAB: 25 mg | ORAL | Qty: 1

## 2019-12-05 MED FILL — HYDROMORPHONE (PF) 2 MG/ML IJ SOLN: 2 mg/mL | INTRAMUSCULAR | Qty: 1

## 2019-12-05 MED FILL — DOK 100 MG CAPSULE: 100 mg | ORAL | Qty: 1

## 2019-12-05 MED FILL — METRO I.V. 500 MG/100 ML INTRAVENOUS PIGGYBACK: 500 mg/100 mL | INTRAVENOUS | Qty: 100

## 2019-12-05 MED FILL — GLYCOPYRROLATE(PF) 0.4 MG/2 ML (0.2 MG/ML) IN STERILE WATER IV SYRINGE: 0.4 mg/2 mL (0.2 mg/mL) | INTRAVENOUS | Qty: 2

## 2019-12-05 MED FILL — GABAPENTIN 300 MG CAP: 300 mg | ORAL | Qty: 1

## 2019-12-05 MED FILL — GABAPENTIN 100 MG CAP: 100 mg | ORAL | Qty: 1

## 2019-12-05 MED FILL — FENTANYL CITRATE (PF) 50 MCG/ML IJ SOLN: 50 mcg/mL | INTRAMUSCULAR | Qty: 2

## 2019-12-05 MED FILL — INSULIN GLARGINE 100 UNIT/ML INJECTION: 100 unit/mL | SUBCUTANEOUS | Qty: 1

## 2019-12-05 MED FILL — AMLODIPINE 5 MG TAB: 5 mg | ORAL | Qty: 2

## 2019-12-05 MED FILL — SENNA LAX 8.6 MG TABLET: 8.6 mg | ORAL | Qty: 1

## 2019-12-05 NOTE — Anesthesia Post-Procedure Evaluation (Signed)
Post-Anesthesia Evaluation and Assessment    Patient: Lindsey Huynh MRN: [id number]  SSN: [id number]    Date of Birth: 1951/06/22  Age: 69 y.o.  Sex: female      I have evaluated the patient and they are stable and ready for discharge from the PACU.     Cardiovascular Function/Vital Signs  Visit Vitals  BP (!) 170/73 (BP 1 Location: Left upper arm, BP Patient Position: At rest)   Pulse 85   Temp 36.9 ??C (98.4 ??F)   Resp 16   Ht 5\' 5"  (1.651 m)   Wt 76.7 kg (169 lb)   SpO2 100%   BMI 28.12 kg/m??       Patient is status post General anesthesia for Procedure(s):  LEFT TRANSMETATARSAL AMPUTATION WITH TENDON BALANCING.    Nausea/Vomiting: None    Postoperative hydration reviewed and adequate.    Pain:  Pain Scale 1: Numeric (0 - 10) (12/06/19 0929)  Pain Intensity 1: 10 (12/06/19 0929)   Managed    Neurological Status:   Neuro (WDL): Within Defined Limits (12/05/19 2122)  Neuro  Neurologic State: Alert (12/05/19 2226)  Orientation Level: Oriented X4 (12/05/19 2226)  Cognition: Appropriate safety awareness;Appropriate for age attention/concentration;Appropriate decision making (12/05/19 2226)  Speech: Clear (12/05/19 2226)  LUE Motor Response: Purposeful (12/06/19 0350)  LLE Motor Response: Purposeful (12/06/19 0350)  RUE Motor Response: Purposeful (12/06/19 0350)  RLE Motor Response: Purposeful (12/06/19 0350)   At baseline    Mental Status, Level of Consciousness: Alert and  oriented to person, place, and time    Pulmonary Status:   O2 Device: Room air (12/05/19 2122)   Adequate oxygenation and airway patent    Complications related to anesthesia: None    Post-anesthesia assessment completed. No concerns    Signed By: Beryle Quant, MD     December 06, 2019              Procedure(s):  LEFT TRANSMETATARSAL AMPUTATION WITH TENDON BALANCING.    general    <BSHSIANPOST>    INITIAL Post-op Vital signs:   Vitals Value Taken Time   BP 147/74 12/05/19 2200   Temp 36.6 ??C (97.8 ??F) 12/05/19 2122   Pulse 83 12/05/19 2204   Resp  16 12/05/19 2204   SpO2 94 % 12/05/19 2204   Vitals shown include unvalidated device data.

## 2019-12-05 NOTE — Progress Notes (Signed)
ID Progress Note  12/05/2019    Subjective:       Afebrile. No dyspnea, abdominal pain, headache or sore throat    ROS: No anaphylaxis, seizures, syncope, hematemesis, hematochezia     Objective:     Vitals:   Visit Vitals  BP 131/77   Pulse 78   Temp 98.3 ??F (36.8 ??C)   Resp 16   Ht 5' 5"  (1.651 m)   Wt 76.7 kg (169 lb)   SpO2 98%   BMI 28.12 kg/m??        Tmax:  Temp (24hrs), Avg:98.2 ??F (36.8 ??C), Min:97.3 ??F (36.3 ??C), Max:98.8 ??F (37.1 ??C)      Exam:    Not in distress  Pink conjunctivae, anicteric sclerae  No cervical lymphdenopathy   Lung clear, no rales, wheezes or rhonchi   Heart: s1, s2, RRR, no murmurs rubs or clicks  Abdomen: soft nontender, no guarding or rebound  Knees not warm or tender  Left foot bandaged    Labs:   Lab Results   Component Value Date/Time    WBC 5.4 12/05/2019 05:29 AM    HGB 7.5 (L) 12/05/2019 05:29 AM    HCT 22.1 (L) 12/05/2019 05:29 AM    PLATELET 299 12/05/2019 05:29 AM    MCV 91.3 12/05/2019 05:29 AM     Lab Results   Component Value Date/Time    Sodium 136 12/05/2019 05:29 AM    Potassium 3.2 (L) 12/05/2019 05:29 AM    Chloride 104 12/05/2019 05:29 AM    CO2 27 12/05/2019 05:29 AM    Anion gap 5 12/05/2019 05:29 AM    Glucose 77 12/05/2019 05:29 AM    BUN 14 12/05/2019 05:29 AM    Creatinine 0.77 12/05/2019 05:29 AM    BUN/Creatinine ratio 18 12/05/2019 05:29 AM    GFR est AA >60 12/05/2019 05:29 AM    GFR est non-AA >60 12/05/2019 05:29 AM    Calcium 9.2 12/05/2019 05:29 AM    Bilirubin, total 0.2 11/23/2019 01:32 PM    Alk. 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  #2 hyperlipidemia  ??  #3 hypertension  ??  #4 diabetes  ??    Recommendations:       She will have surgery today    Continue vanco,  cefepime and flagyl     Blair Hailey, MD

## 2019-12-05 NOTE — Consults (Addendum)
Cardiology Consult Note      Patient Name: Lindsey Huynh  DOB: [birth year] MRN: [id number]  Date: 12/05/2019  Time: 9:51 AM    Admit Diagnosis: Gangrene ([place]) [I96]    Primary Cardiologist: none    Consulting Cardiologist: Madolyn Frieze, MD    Reason for Consult: surgical clearance    Requesting MD: Dr. Duwaine Maxin                  CARDIOLOGY ATTENDING ATTESTATION    CONSULT/PROGRESS NOTE      Patient seen on the day of progress note and examined  and agree with Advance Practice Provider (APP, NP,PA)  assessment and plans.         Brief HPI: very pleasant 69 yo female with h/o HTN, DM and PAD now schedule for toe amputation  She is now post op day #6 from left leg bypass and doing well    She denies any cp or sob r palpitations    She is usually active at home   She belongs to a bowling league and doing well    She does all of her house chores with no symptoms at all        A/P:1, preop: patient is completely asymptomatic cardiac wise  His ECG shows NSR LVH and nstt and no acute ischemic changes    Echo pending for ef mostly    At this time the patient is at an acceptable cardiac risk to proceed with surgery as planned            BP Readings from Last 3 Encounters:   12/05/19 136/77   04/12/19 156/80   10/30/18 128/82       Pulse Readings from Last 3 Encounters:   12/05/19 82   04/12/19 69   10/30/18 71       Neck: no JVD  Heart: regular rate and rhythm  Lungs: clear to auscultation bilaterally  Abdomen: soft, non-tender. Bowel sounds normal. No masses,  no organomegaly  Extremities: no edema             No results found for: CPK, RCK1, RCK2, RCK3, RCK4, CKMB, CKNDX, CKND1, TROPT, TROIQ, BNPP, BNP    Lab Results   Component Value Date/Time    Creatinine (POC) 0.7 10/07/2010 09:41 AM    Creatinine 0.77 12/05/2019 05:29 AM       Lab Results   Component Value Date/Time    HGB 7.5 (L) 12/05/2019 05:29 AM                         HPI:  Lindsey Huynh is  a 69 y.o. female admitted on 11/23/2019  for Gangrene ([place]) [I96].   has a past medical history of Diabetes ([place]), GERD (gastroesophageal reflux disease), Hypercholesterolemia, and Hypertension.     Presented for left foot pain and swelling.  Dx with left great toe gangrene.  Plan is for toe amputation and biopsy today.  She has no h/o CAD, MI or CHF.  She is POD #6 from leg leg bypass with Dr. Timmothy Sours.  She state she has FmHx for DM, but no MI, CAD or CHF.  Used to smoke, but quit 20-30 years ago.  She is active.  Before her left leg bypass and foot issues, she walked daily, at least 5 blocks.  She also is an active bowler.  She is on a team and league bowls for money.  H/o HTN, DM, HLD, GERD and PVD.  Subjective:  Offers no complaints.  No c/o CP or SOB with activities (walking or bowling).  Denies dizziness, palpitations or leg edema      Assessment and Plan     1. Left great toe osteomyelitis   - plan for amputation today   - IV ABx   - ID consulted by Primary team  2. Gangrene   - Left 2nd toe   - s/p fem-pop bypass on 11/28/19   - Vasc surgery following    - continues with surgical staples LLE  3. Anemia   - Hgb 7.6   - Occult ordered  4. DM II   - A1c 8.1   - per Primary team  5. HTN   - Moderate control   - PTA Norvasc 10, HCTZ 25, Cozaar 100,   6. HLD  Cholesterol, total 219 (H) 10/30/2018 10:27 AM   HDL Cholesterol 63 10/30/2018 10:27 AM   LDL, calculated 130 (H) 10/30/2018 10:27 AM   VLDL, calculated 26 10/30/2018 10:27 AM   Triglyceride 131 10/30/2018 10:27 AM    - No on statin - recommend starting Lipitor    7. GERD   - Protonix 40 mg     No c/o CP, SOB or edema.  She is active with daily walking and bowls on a league (averages 140s), no CP or SOB with activities.  No family h/o CAD, MI or CHF - just DM.  She does have RF - HTN, DM, HLD, tobacco exposure, post menopausal and PVD;  No indication at this time for cardiac ischemia with activities or at rest.  EKG normal.  Will check echo.    I have no  objection to the planned surgery. Patient seems to be at a low risk for peri-operative severe adverse cardiac events.      Patient Active Problem List   Diagnosis Code   ??? HTN (hypertension) I10   ??? GERD (gastroesophageal reflux disease) K21.9   ??? Hypercholesterolemia E78.00   ??? Diabetes ([place]) E11.9   ??? Constipation K59.00   ??? Postmenopausal Z78.0   ??? 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 Cataract of left eye H26.9   ??? 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 Type 2 diabetes mellitus with diabetic neuropathy ([place]) E11.40   ??? Gangrene ([place]) I96     No specialty comments available.      Review of Systems:    [] Patient unable to provide secondary to condition    [x] All systems negative, except as checked below.  Constitutional:    []Weight Change  []Fever   []Chills   []Night Sweats  []Fatigue  []Malaise  []____  ENT/Mouth:     []Hearing Changes  []Ear Pain  []Nasal Congestion   []Sinus Pain  []Hoarseness   []Sore throat  []Rhinorrhea  []Swallowing Difficulty  []____  Eyes:    []Eye Pain  []Swelling  []Redness  []Foreign Body  []Discharge  []Vision Changes  []____  Cardiovascular:    []Chest Pain  []SOB  []PND  []DOE  []Orthopnea  []Claudication  []Edema   []Palpitations  []____  Respiratory:    []Cough  []Sputum  []Wheezing,  []SOB  []Hemoptysis  []____  Gastrointestinal:    []Nausea  []Vomiting  []Diarrhea  []Constipation  []Pain  []Heartburn  []Anorexia  []Dysphagia  []Hematochezia  []Melena,  []Jaundice  []____  Genitourinary:    []Dysuria  []Urinary Frequency  []Hematuria  []Urinary Incontinence  []Urgency  []Flank Pain  []Hesitancy  []  ____  Musculoskeletal:    []Arthralgias  []Myalgias  []Joint Swelling  []Joint Stiffness  []Back Pain  []Neck Pain    [x]LLE pain___  Skin:    []Skin Lesions  []Pruritis  []Hair Changes  []Skin rashes  []____  Neuro:    []Weakness  []Numbness  []Paresthesias  []Loss of Consciousness  []Syncope   []Dizziness  []Headache  []Coordination Changes  []Recent Falls  []____  Psych:     []Anxiety/Depression  []Insomnia  []Memory Changes  []Violence/Abuse Hx.  []____  Heme/Lymph:    []Bruising  []Bleeding  []Lymphadenopathy  []____  Endocrine:    []Polyuria  []Polydipsia  []Temperature Intolerance  []____         Previous treatment/evaluation includes   none  Cardiac risk factors:   smoking/ tobacco exposure, dyslipidemia, diabetes mellitus, sedentary life style, hypertension, post-menopausal.    Past Medical History:   Diagnosis Date   ??? Diabetes (HCC)     insulin + metformin   ??? GERD (gastroesophageal reflux disease)    ??? Hypercholesterolemia    ??? Hypertension      Past Surgical History:   Procedure Laterality Date   ??? HX COLONOSCOPY     ??? HX ORTHOPAEDIC  06-30-10    back surgery (tumor removed)   ??? HX TUMOR REMOVAL  06/30/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     Current Facility-Administered Medications   Medication Dose Route Frequency   ??? potassium chloride SR (KLOR-CON 10) tablet 40 mEq  40 mEq Oral BID   ??? cefepime (MAXIPIME) 2 g in 0.9% sodium chloride (MBP/ADV) 100 mL MBP  2 g IntraVENous Q8H   ??? insulin glargine (LANTUS) injection 12 Units  12 Units SubCUTAneous QHS   ??? vancomycin (VANCOCIN) 1250 mg in NS 250 ml infusion  1,250 mg IntraVENous Q16H   ??? acetaminophen (TYLENOL) tablet 650 mg  650 mg Oral Q6H   ??? gabapentin (NEURONTIN) capsule 300 mg  300 mg Oral QHS   ??? gabapentin (NEURONTIN) capsule 100 mg  100 mg Oral DAILY   ??? Vancomycin- Pharmacy to Dose   Other Rx Dosing/Monitoring   ??? metroNIDAZOLE (FLAGYL) IVPB premix 500 mg  500 mg IntraVENous Q12H   ??? sodium chloride (NS) flush 5-40 mL  5-40 mL IntraVENous Q8H   ??? sodium chloride (NS) flush 5-40 mL  5-40 mL IntraVENous PRN   ??? sodium phosphate (FLEET'S) enema 1 Enema  1 Enema Rectal PRN   ??? oxyCODONE IR (ROXICODONE) tablet 5 mg  5 mg Oral Q4H PRN   ??? losartan (COZAAR) tablet 100 mg  100 mg Oral DAILY   ??? docusate sodium (COLACE) capsule 100 mg  100 mg Oral BID   ??? senna (SENOKOT) tablet 8.6 mg  1 Tab Oral DAILY   ??? polyethylene glycol (MIRALAX)  packet 17 g  17 g Oral DAILY PRN   ??? bisacodyL (DULCOLAX) suppository 10 mg  10 mg Rectal DAILY PRN   ??? hydroCHLOROthiazide (HYDRODIURIL) tablet 25 mg  25 mg Oral DAILY   ??? sodium chloride (NS) flush 5-40 mL  5-40 mL IntraVENous Q8H   ??? sodium chloride (NS) flush 5-40 mL  5-40 mL IntraVENous PRN   ??? acetaminophen (TYLENOL) suppository 650 mg  650 mg Rectal Q6H PRN   ??? promethazine (PHENERGAN) tablet 12.5 mg  12.5 mg Oral Q6H PRN    Or   ??? ondansetron (ZOFRAN) injection 4 mg  4 mg IntraVENous Q6H PRN   ??? glucose chewable tablet 16 g  4 Tab Oral PRN   ??? dextrose (D50W) injection syrg 12.5-25 g  25-50 mL IntraVENous PRN   ??? glucagon (GLUCAGEN) injection 1 mg  1 mg IntraMUSCular PRN   ??? insulin lispro (HUMALOG) injection   SubCUTAneous AC&HS   ??? aspirin delayed-release tablet 81 mg  81 mg Oral DAILY   ??? pantoprazole (PROTONIX) tablet 40 mg  40 mg Oral ACB   ??? amLODIPine (NORVASC) tablet 10 mg  10 mg Oral DAILY   ??? [Held by provider] enoxaparin (LOVENOX) injection 40 mg  40 mg SubCUTAneous Q24H       Allergies   Allergen Reactions   ??? Neuromuscular Blockers, Steroidal Other (comments)     GI Upset      History reviewed. No pertinent family history.   Social History     Socioeconomic History   ??? Marital status: SINGLE     Spouse name: Not on file   ??? Number of children: Not on file   ??? Years of education: Not on file   ??? Highest education level: Not on file   Tobacco Use   ??? Smoking status: Former Smoker     Years: 10.00   ??? Smokeless tobacco: Never Used   Substance and Sexual Activity   ??? Alcohol use: Yes     Alcohol/week: 5.0 standard drinks     Types: 6 Cans of beer per week   ??? Drug use: No   ??? Sexual activity: Not Currently       Objective:    Physical Exam    Vitals:   Vitals:    12/04/19 1407 12/04/19 2106 12/05/19 0223 12/05/19 0824   BP: 132/72 (!) 152/63 128/71 136/77   Pulse: 88 85 73 82   Resp: _0 Temp: 98.7 ??F (37.1 ??C) 98.8 ??F (37.1 ??C) 97.3 ??F (36.3 ??C) 98.2 ??F (36.8 ??C)   SpO2: 98% 100% 98% 98%    Weight:       Height:           General:    Alert, cooperative, no distress, appears stated age.   Neck:   Supple,  no JVD.   Back:     Symmetric, normal curvature.    Lungs:     clear to auscultation bilaterally.   Heart::    Regular rate and rhythm, S1, S2 normal, no murmur, click, rub or gallop.   Abdomen:     Soft, non-tender. Bowel sounds normal.    Extremities:   Extremities normal, atraumatic, no cyanosis or edema. LLE with surgical staples   Vascular:   Pulses - 2+ radials   Skin:   Skin color normal. No rashes or lesions   Neurologic:   CN II-XII grossly intact.        Telemetry:     ECG:   EKG Results     Procedure 720 Value Units Date/Time    EKG, 12 LEAD, INITIAL [[id number]] Collected: 11/28/19 1053    Order Status: Completed Updated: 11/29/19 0609     Ventricular Rate 77 BPM      Atrial Rate 77 BPM      P-R Interval 132 ms      QRS Duration 88 ms      Q-T Interval 374 ms      QTC Calculation (Bezet) 423 ms      Calculated P Axis 35 degrees      Calculated R Axis -2 degrees      Calculated T Axis  129 degrees      Diagnosis --     Sinus rhythm with frequent premature ventricular complexes  Left ventricular hypertrophy with repolarization abnormality  No previous ECGs available  Confirmed by Marlon Pel, M.D., Altha Harm [phone]) on 11/29/2019 6:09:48 AM              Data Review:     Radiology:   XR Results (most recent):  Results from [facility] encounter on 11/23/19   NC XR TECHNOLOGIST SERVICE    Narrative Fluoroscopy was utilized.        Impression FLUOROSCOPY WAS USED.    Fluoro Dose:  144.09 mGy.      vk         No results for input(s): CPK, TROIQ in the last 72 hours.    No lab exists for component: CKQMB, CPKMB, BMPP  Recent Labs     12/05/19  0529 12/04/19  0457   NA 136 138   K 3.2* 3.2*   CL 104 102   CO2 27 25   BUN 14 13   CREA 0.77 0.87   GLU 77 70   CA 9.2 9.1     Recent Labs     12/05/19  0529 12/04/19  0457   WBC 5.4 5.1   HGB 7.5* 7.8*   HCT 22.1* 22.6*   PLT 299 344     No results for  input(s): PTP, INR, AP, INREXT in the last 72 hours.    No lab exists for component: PTTP, GPT, SGOT  No results for input(s): CHOL, LDLC in the last 72 hours.    No lab exists for component: TGL, HDLC,  HBA1C  No results for input(s): CRP, TSH, TSHEXT in the last 72 hours.    No lab exists for component: ESR    Gildardo Pounds. Nadara Mustard, PA-C  Madolyn Frieze, MD         [facility], [address]     [place], [place]     [phone]    EC:XFQH, Gae Gallop, NP

## 2019-12-05 NOTE — Progress Notes (Signed)
[facility]                                                                                          Hospitalist Progress Note  [name], MD  Answering service: [phone] OR [phone] from in house phone        Date of Service:  12/05/2019  NAME:  Lindsey Huynh  DOB:  October 19, 1950  MRN:  [id number]      Admission Summary:   From H&P 11/23/2019:  "Char Ben??is a 69 y.o.??female??with past medical history of diabetes, insulin-dependent, GERD and hypertension comes for return of left foot pain and swelling.    Interval history / Subjective:     F/U for left foot cellulitis, left great toe gangrene, DMII, HTN, GERD, Hypokalemia, left popliteal occlusion s/p left fem-pop bypass   scheduled for amputation of toe @ 5 pm today NPO      Assessment & Plan:     Left great toe Osteomyelitis   - MRI LLE: 1. First distal phalanx osteomyelitis.  2. No abscess.  - IV antibiotics  - Vascular surgery following- Patient to follow up in 2 weeks post- op for staple removal.   - Consult ID for antibiotic recommendations   - Podiatry following - plan for amputation of toe today  ??  LLE pain:??dry gangrene??of??left 2nd toe cellulitic changes/Left popliteal occlusion  -s/p left fem-pop bypass (03/11)  -CT LLE (03/06) NL  -Elevated Sed rate and CRP  -BC (03/06) NGTD   -Pain control  - Podiatry??following   - MRI - 1. First distal phalanx osteomyelitis.. No abscess.  ??  Anemia   Stable, No sign of bleeding noted.   - hgb 7.6 today - Monitor labs and transfuse if hgb <7.0   ??  DMII:??hold metformin while IP  -Cont basal insulin, SSI  -Diabetic diet  -a1c 8.1  - DM following??  ??  HTN  -Controlled  -Cont home meds   ??  GERD  -Cont PPI  ??  Hypokalemia  -potassium 3.2  - Replaced     Code status: Full   DVT prophylaxis: Lovenox ( hold for surgery )     Care Plan discussed with: Patient/Family, Nurse and Case Manager  Anticipated Disposition: SNF/LTC  Anticipated Discharge: 24 hours to 48 hours      Hospital Problems  Date Reviewed: 06/23/18          Codes Class Noted POA    Gangrene ([place]) ICD-10-CM: [id number]  ICD-9-CM: 785.4  11/23/2019 Unknown                Review of Systems:   A comprehensive review of systems was negative except for that written in the HPI.       Vital Signs:    Last 24hrs VS reviewed since prior progress note. Most recent are:  Visit Vitals  BP 136/77 (BP Patient Position: At rest)   Pulse 82   Temp 98.2 ??F (36.8 ??C)   Resp 16   Ht 5\' 5"  (1.651 m)   Wt 76.9 kg (169 lb 8 oz)   SpO2 98%  BMI 28.21 kg/m??       No intake or output data in the 24 hours ending 12/05/19 [id number]     Physical Examination:             Constitutional:  No acute distress, cooperative, pleasant, answers questions   ENT:  Oral mucosa moist, oropharynx benign.    Resp:  CTA bilaterally. No wheezing/rhonchi/rales. No accessory muscle use   CV:  Regular rhythm, normal rate, no murmurs, gallops, rubs    GI:  Soft, non distended, non tender. normoactive bowel sounds    Musculoskeletal:  No edema, warm, 2+ pulses throughout    Neurologic:  Moves all extremities.  AAOx3, CN II-XII reviewed, follows commands     Psych:  Good insight, Not anxious nor agitated.   Skin:????Good turgor, no rashes or ulcers and Four left leg surgical incisions noted, staples intact. No signs of infection noted  ??            Data Review:    Review and/or order of clinical lab test  Review and/or order of tests in the medicine section of CPT      Labs:     Recent Labs     12/05/19  0529 12/04/19  0457   WBC 5.4 5.1   HGB 7.5* 7.8*   HCT 22.1* 22.6*   PLT 299 344     Recent Labs     12/05/19  0529 12/04/19  0457 12/03/19  0543   NA 136 138 137   K 3.2* 3.2* 3.6   CL 104 102 102   CO2 27 25 25    BUN 14 13 14    CREA 0.77 0.87 0.94   GLU 77 70 80   CA 9.2 9.1 9.9     No results for input(s): ALT, AP, TBIL, TBILI, TP, ALB, GLOB, GGT, AML, LPSE in the last 72 hours.    No lab exists for component: SGOT, GPT, AMYP, HLPSE  No results for input(s): INR, PTP, APTT,  INREXT, INREXT in the last 72 hours.   No results for input(s): FE, TIBC, PSAT, FERR in the last 72 hours.   No results found for: FOL, RBCF   No results for input(s): PH, PCO2, PO2 in the last 72 hours.  No results for input(s): CPK, CKNDX, TROIQ in the last 72 hours.    No lab exists for component: CPKMB  Lab Results   Component Value Date/Time    Cholesterol, total 219 (H) 10/30/2018 10:27 AM    HDL Cholesterol 63 10/30/2018 10:27 AM    LDL, calculated 130 (H) 10/30/2018 10:27 AM    Triglyceride 131 10/30/2018 10:27 AM    CHOL/HDL Ratio 3.4 12/26/2007 09:12 AM     Lab Results   Component Value Date/Time    Glucose (POC) 99 12/05/2019 05:52 AM    Glucose (POC) 135 (H) 12/04/2019 09:04 PM    Glucose (POC) 129 (H) 12/04/2019 04:00 PM    Glucose (POC) 166 (H) 12/04/2019 11:44 AM    Glucose (POC) 97 12/04/2019 06:58 AM     No results found for: COLOR, APPRN, SPGRU, REFSG, PHU, PROTU, GLUCU, KETU, BILU, UROU, NITU, LEUKU, GLUKE, EPSU, BACTU, WBCU, RBCU, CASTS, UCRY      Medications Reviewed:     Current Facility-Administered Medications   Medication Dose Route Frequency   ??? cefepime (MAXIPIME) 2 g in 0.9% sodium chloride (MBP/ADV) 100 mL MBP  2 g IntraVENous Q8H   ??? insulin glargine (LANTUS) injection 12 Units  12 Units SubCUTAneous QHS   ??? vancomycin (VANCOCIN) 1250 mg in NS 250 ml infusion  1,250 mg IntraVENous Q16H   ??? acetaminophen (TYLENOL) tablet 650 mg  650 mg Oral Q6H   ??? gabapentin (NEURONTIN) capsule 300 mg  300 mg Oral QHS   ??? gabapentin (NEURONTIN) capsule 100 mg  100 mg Oral DAILY   ??? Vancomycin- Pharmacy to Dose   Other Rx Dosing/Monitoring   ??? metroNIDAZOLE (FLAGYL) IVPB premix 500 mg  500 mg IntraVENous Q12H   ??? sodium chloride (NS) flush 5-40 mL  5-40 mL IntraVENous Q8H   ??? sodium chloride (NS) flush 5-40 mL  5-40 mL IntraVENous PRN   ??? sodium phosphate (FLEET'S) enema 1 Enema  1 Enema Rectal PRN   ??? oxyCODONE IR (ROXICODONE) tablet 5 mg  5 mg Oral Q4H PRN   ??? losartan (COZAAR) tablet 100 mg  100 mg  Oral DAILY   ??? docusate sodium (COLACE) capsule 100 mg  100 mg Oral BID   ??? senna (SENOKOT) tablet 8.6 mg  1 Tab Oral DAILY   ??? polyethylene glycol (MIRALAX) packet 17 g  17 g Oral DAILY PRN   ??? bisacodyL (DULCOLAX) suppository 10 mg  10 mg Rectal DAILY PRN   ??? hydroCHLOROthiazide (HYDRODIURIL) tablet 25 mg  25 mg Oral DAILY   ??? sodium chloride (NS) flush 5-40 mL  5-40 mL IntraVENous Q8H   ??? sodium chloride (NS) flush 5-40 mL  5-40 mL IntraVENous PRN   ??? acetaminophen (TYLENOL) suppository 650 mg  650 mg Rectal Q6H PRN   ??? promethazine (PHENERGAN) tablet 12.5 mg  12.5 mg Oral Q6H PRN    Or   ??? ondansetron (ZOFRAN) injection 4 mg  4 mg IntraVENous Q6H PRN   ??? glucose chewable tablet 16 g  4 Tab Oral PRN   ??? dextrose (D50W) injection syrg 12.5-25 g  25-50 mL IntraVENous PRN   ??? glucagon (GLUCAGEN) injection 1 mg  1 mg IntraMUSCular PRN   ??? insulin lispro (HUMALOG) injection   SubCUTAneous AC&HS   ??? aspirin delayed-release tablet 81 mg  81 mg Oral DAILY   ??? pantoprazole (PROTONIX) tablet 40 mg  40 mg Oral ACB   ??? amLODIPine (NORVASC) tablet 10 mg  10 mg Oral DAILY   ??? [Held by provider] enoxaparin (LOVENOX) injection 40 mg  40 mg SubCUTAneous Q24H     ______________________________________________________________________  EXPECTED LENGTH OF STAY: 4d 2h  ACTUAL LENGTH OF STAY:          12                 [name], MD

## 2019-12-05 NOTE — Other (Signed)
Bedside shift change report given to Linard Millers ,RN (oncoming nurse) by Malva Cogan, RN(offgoing nurse).  Report given with SBAR.

## 2019-12-05 NOTE — Brief Op Note (Signed)
Brief Postoperative Note    Patient: Lindsey Huynh  Date of Birth: 05-01-1951  MRN: [id number]    Date of Procedure: 12/05/2019     Pre-Op Diagnosis: CHRONIC OSTEOMYELITIS LEFT FOOT    Post-Op Diagnosis: Same as preoperative diagnosis.      Procedure(s):  LEFT TRANSMETATARSAL AMPUTATION WITH PERCUTANEOUS ACHILLES TENDON LENGTHENING    Surgeon(s):  [name], DPM    Surgical Assistant: None    Anesthesia: General     Estimated Blood Loss (mL): less than 50     Complications: None    Specimens:   ID Type Source Tests Collected by Time Destination   1 : PROXIMAL MARGIN LEFT FIRST RAY Fresh Foot, left  [name], [facility] 12/05/2019 1940 Pathology   2 : LEFT FOREFOOT Fresh Foot, left  [name], [facility] 12/05/2019 1959 Pathology   1 : LEFT DISTAL PHALYNX Bone Foot, left CULTURE, AEROBIC AND ANAEROBIC [name], [facility] 12/05/2019 1940 Microbiology   2 : LEFT DISTAL PHALYNX Bone Foot, left CULTURE, AEROBIC AND ANAEROBIC [name], [facility] 12/05/2019 1957 Microbiology        Implants: * No implants in log *    Drains: * No LDAs found *    Findings: necrosis noted to subcutaneous tissue of wound base surrounding hallux    Electronically Signed by [name], DPM on 12/05/2019 at 8:49 PM

## 2019-12-05 NOTE — Other (Signed)
CLINICAL NURSE SPECIALIST CONSULT     FOLLOW UP  NOTE  Initial Presentation   Lindsey Huynh is a 69 y.o. female admitted over weekend with left foot pain. Left great toe gangrenous.   Initial work up and evaluation reveals NO osteomyelitits.      HX:   Past Medical History:   Diagnosis Date   ??? Diabetes ([place])     insulin + metformin   ??? GERD (gastroesophageal reflux disease)    ??? Hypercholesterolemia    ??? Hypertension         DX: CT-no osteo/ Xray-unremarkable    [place]: Vascular surgery consulted- left leg medially with healing ulcerations consistent with venous ulcer. Plan for flow studies today.     Hospital course   Clinical progress has been complicated by venous insufficiency - BG trends mostly within goal during admission.  S/p Fem-Pop bypass on 3/11 and recovering well-Dr. Timmothy Sours following  Podiatry following for left 1st and 2nd toe diabetic ulcers with necrotic eschar present. NO surgical intervention chosen at this time-treating conservatively.      12/05/19: Patient elected for amputation of left 1st and 2nd toe-cardiology cleared, for OR today.    Diabetes    Patient has known Type 2 diabetes, treated with Levemir and Metformin PTA.     Admission BG 170 and A1c 8.15 indicate more acceptable diabetes control. Previous A1C 03/2019 11.0% . Highest A1C Feb. 2020-12.1%.   Ambulatory blood glucose management provided by primary care provider-Last seen 11/05/2019 virtual visit  Consulted by Provider for advanced diabetes nursing assessment and care, specifically related to   []  Transitioning off Glucostabilizer   []  Inpatient management strategy  [x]  Home management assessment  []  Survival skill education    Diabetes-related medical history  Acute complications  NONE  Neurological complications  Peripheral neuropathy  Microvascular disease  NONE  Macrovascular disease  Peripheral vascular disease and Foot wounds  Other associated conditions     HTN/hypercholesteremia/     Diabetes medication history  Drug class Currently in use Discontinued Never used   Biguanide Metformin 1000mg  daily in divided doses     DDP-4 inhibitor       Sulfonylurea      Thiazolidinedione      GLP-1 RA      SGLT-2 inhibitors      Basal insulin Levemir 20units daily     Bolus insulin      Fixed Dose  Combinations        Subjective   S/p fem/pop bypass 11/28/19-recovering well.  For OR today for left 1st and 2nd toe amputation.       Patient reports the following home diabetes self-care practices:  Eating pattern-cut out "sugar", made some diet changes, with carb intake, especially breads.     Physical activity pattern-desires to be physically active, but recent pain in left leg has her unable to do any physical activity    Monitoring pattern-checks every other day before and after meals.      Taking medications pattern  [x]  Consistent administration  [x]  Affordable    Social determinants of health impacting diabetes self-management practices   Concerned that you need to know more about how to stay healthy with diabetes     Objective   Physical exam  General Alert, oriented and in no acute distress. Conversant and cooperative.   Vital Signs   Visit Vitals  BP 131/77 (BP 1 Location: Left upper arm)  Pulse 78   Temp 98.3 ??F (36.8 ??C)   Resp 16   Ht 5\' 5"  (1.651 m)   Wt 76.9 kg (169 lb 8 oz)   SpO2 98%   BMI 28.21 kg/m??     Skin  Warm and dry.    Heart   Regular rate and rhythm. No murmurs, rubs or gallops  Lungs  Clear to auscultation without rales or rhonchi  Extremities     Diabetic foot exam:    Left Foot-=+2 edema noted left lower leg      Visual Exam: ulcer- left great toe under nail and 2nd toe-black eschar noted   Pulse DP: 1+ (weak)   Filament test: reduced sensation      Right Foot   Visual Exam: normal    Pulse DP: 2+ (normal)   Filament test: reduced sensation     DP & PT pulses +2.     Laboratory  BMP:   Lab Results   Component Value Date/Time    NA 136 12/05/2019 05:29 AM    K 3.2 (L) 12/05/2019  05:29 AM    CL 104 12/05/2019 05:29 AM    CO2 27 12/05/2019 05:29 AM    AGAP 5 12/05/2019 05:29 AM    GLU 77 12/05/2019 05:29 AM    BUN 14 12/05/2019 05:29 AM    CREA 0.77 12/05/2019 05:29 AM    GFRAA >60 12/05/2019 05:29 AM    GFRNA >60 12/05/2019 05:29 AM      Factors impacting BG management  Factor Dose Comments   Nutrition:  Carb-controlled meals     60 grams/meal      Pain Left foot    Infection IV abx (vanc/ cefepime)    Other: venous insuffiencey  Vascular surgery following S/p fem pop 11/28/19     Blood glucose pattern        Assessment and Plan   Nursing Diagnosis Risk for unstable blood glucose pattern   Nursing Intervention Domain 5250 Decision-making Support   Nursing Interventions Examined current inpatient diabetes control   Explored factors facilitating and impeding inpatient management  Identified self-management practices impeding diabetes control  Explored corrective strategies with patient and responsible inpatient provider   Informed patient of rational for insulin strategy while hospitalized    Instructed patient in importance of checking blood glucose DAILY before meals - discussed when she should check her BG after meals-2hours     Evaluation   [name], with Type 2 diabetes, did not achieve diabetes control prior to admission, as evidenced by admission BG of 170 and A1c of 81%.  She is followed closely by her PCP for continued diabetes management.  She last saw PCP via virtual visit in Feb. 2021. Her A1C is improved since July 2020 when it was >11%. She is concerned about the fate of her left foot, but is encouraged that her A1C has lowered significantly since July 2020.  S/p fem/pop bypass on 3/11 and recovering.  Slated for rehab in coming days.     NPO today for surgery.  Fasting BG 99mg /dl. Pre-prandial 90mg /dl.      Recommendations     [x]  Use of Subcutaneous Insulin Order set [id number])  Insulin Dosing Specific recommendation   CONTINUE   Basal                               (Based on  weight, BMI & GFR) 12 units daily (  20% reduction)    CONTINUE Corrective                                       (Useful in adjusting insulin dosing) [x]  Normal sensitivity          Discharge Planning   1.   Discharge on hospital doses of insulin.  2. Re-start Metformin at PTA doses    Billing Code(s)   [x]  99231 IP subsequent hospital care - 15 minutes    Before making these care recommendations, I personally reviewed the hospitalization record, including notes, laboratory & diagnostic data and current medications, and   examined the patient at the bedside (circumstances permitting) before making care recommendations.     Total minutes: 15    Lily Lovings, CNS  Diabetes Clinical Nurse Specialist  [facility]  Access via [place]

## 2019-12-05 NOTE — Progress Notes (Signed)
Bedside and Verbal shift change report given to Devon (oncoming nurse) by Kim (offgoing nurse). Report included the following information SBAR, Kardex and MAR.

## 2019-12-05 NOTE — Anesthesia Pre-Procedure Evaluation (Addendum)
Relevant Problems   CARDIOVASCULAR   (+) HTN (hypertension)      GASTROINTESTINAL   (+) GERD (gastroesophageal reflux disease)      ENDOCRINE   (+) Diabetes (HCC)   (+) Type 2 diabetes mellitus with diabetic neuropathy (HCC)   (+) Type 2 diabetes with nephropathy (HCC)       Anesthetic History   No history of anesthetic complications            Review of Systems / Medical History  Patient summary reviewed, nursing notes reviewed and pertinent labs reviewed    Pulmonary  Within defined limits                 Neuro/Psych   Within defined limits           Cardiovascular    Hypertension          PAD         GI/Hepatic/Renal  Within defined limits   GERD           Endo/Other  Within defined limits  Diabetes         Other Findings              Physical Exam    Airway  Mallampati: II  TM Distance: > 6 cm  Neck ROM: normal range of motion   Mouth opening: Normal     Cardiovascular  Regular rate and rhythm,  S1 and S2 normal,  no murmur, click, rub, or gallop             Dental  No notable dental hx       Pulmonary  Breath sounds clear to auscultation               Abdominal  GI exam deferred       Other Findings            Anesthetic Plan    ASA: 3  Anesthesia type: general            Anesthetic plan and risks discussed with: Patient

## 2019-12-05 NOTE — Other (Signed)
Patient: Lindsey Huynh MRN: [id number]  SSN: [id number]   Date of Birth: [birth year]  Age: 69 y.o.  Sex: female     Patient is status post Procedure(s):  LEFT TRANSMETATARSAL AMPUTATION WITH TENDON BALANCING.    Surgeon(s) and Role:     [name], DPM - Primary    Local/Dose/Irrigation:  See [facility]            PICC Double Lumen [id number] Right;Basilic (Active)   Central Line Being Utilized Yes 12/05/19 0846   Criteria for Appropriate Use Long term IV/antibiotic administration 12/05/19 0846   Site Assessment Clean, dry, & intact 12/05/19 0846   Phlebitis Assessment 0 12/05/19 0846   Infiltration Assessment 0 12/05/19 0846   Arm Circumference (cm) 28 cm 12/03/19 1101   Date of Last Dressing Change 12/03/19 12/05/19 0846   Dressing Status Clean, dry, & intact 12/05/19 0846   Action Taken Open ports on tubing capped 12/05/19 0846   External Catheter Length (cm) 1 centimeters 12/03/19 1101   Dressing Type Disk with Chlorhexadine gluconate (CHG) 12/05/19 0846   Hub Color/Line Status Capped 12/05/19 0846   Positive Blood Return (Site #1) Yes 12/05/19 0846   Hub Color/Line Status Capped 12/05/19 0846   Positive Blood Return (Site #2) Yes 12/05/19 0846   Alcohol Cap Used Yes 12/05/19 0846                  Airway - Endotracheal Tube 12/05/19 (Active)       Airway - Endotracheal Tube 12/05/19 (Active)                   Dressing/Packing:  Wound Toe (Comment  which one) Left-Dressing/Treatment: ABD pad;Gauze dressing/dressing sponge;Other (Comment)(Curlex, 4x4) (12/05/19 2026)  Incision 11/25/19 Groin Right-Dressing/Treatment: Open to air (12/04/19 2106)  Incision 11/28/19 Leg Left-Dressing/Treatment: Open to air (12/04/19 2106)    Splint/Cast:  ]    Other:  ACE wrap

## 2019-12-05 NOTE — Progress Notes (Signed)
TRANSFER - IN REPORT:    Verbal report received from [place] (name) on Lindsey Huynh  being received from [facility] (unit) for ordered procedure      Report consisted of patient???s Situation, Background, Assessment and   Recommendations(SBAR).     Information from the following report(s) SBAR, Kardex, Intake/Output and MAR was reviewed with the receiving nurse.    Opportunity for questions and clarification was provided.      Assessment completed upon patient???s arrival to unit and care assumed.

## 2019-12-05 NOTE — Consults (Signed)
Consults by  Madolyn Frieze, MD at 12/05/19 [phone]                Author: Madolyn Frieze, MD  Service: Cardiology  Author Type: Physician       Filed: 12/05/19 1049  Date of Service: 12/05/19 0951  Status: Addendum          Editor: Madolyn Frieze, MD (Physician)          Related Notes: Original Note by Eula Listen (Physician Assistant) filed at 12/05/19  1012            Consult Orders        1. IP CONSULT TO CARDIOLOGY [[id number]] ordered by [name], DPM at 12/05/19 0840                                                                                                     C ardiology Consult Note         Patient Name: Lindsey Huynh  DOB : 08/29/1951 MRN: [id number]   Date: 12/05/2019  Time : 9:51 AM      Admit Diagnosis: Gangrene ([place]) [I96]      Primary Cardiologist: none     Consulting Cardiologist:  Madolyn Frieze, MD      Reason for Consult: surgical  clearance      Requesting MD: Dr. Duwaine Maxin                             CARDIOLOGY ATTENDING ATTESTATION      CONSULT/PROGRESS NOTE           Patient seen on the day of progress note and examined  and agree with Advance Practice Provider (APP, NP,PA)  assessment and plans.             Brief HPI: very pleasant 69 yo female with h/o HTN, DM and PAD now schedule for toe amputation   She is now post op day #6 from left leg bypass and doing well      She denies any cp or sob r palpitations      She is usually active at home    She belongs to a bowling league and doing well      She does all of her house chores with no symptoms at all            A/P:1, preop: patient is completely asymptomatic cardiac wise   His ECG shows NSR LVH and nstt and no acute ischemic changes      Echo pending for ef mostly      At this time the patient is at an acceptable cardiac risk to proceed with surgery as planned                  BP Readings from Last 3 Encounters:      12/05/19  136/77      04/12/19  156/80      10/30/18  128/82  Pulse Readings  from Last 3 Encounters:      12/05/19  82      04/12/19  69      10/30/18  71              Neck: no JVD   Heart: regular rate and rhythm   Lungs: clear to auscultation bilaterally   Abdomen: soft, non-tender. Bowel sounds normal. No masses,  no organomegaly   Extremities: no edema                   No results found for: CPK, RCK1, RCK2, RCK3, RCK4, CKMB, CKNDX, CKND1, TROPT, TROIQ, BNPP, BNP      Lab Results      Component  Value  Date/Time        Creatinine (POC)  0.7  10/07/2010 09:41 AM        Creatinine  0.77  12/05/2019 05:29 AM              Lab Results      Component  Value  Date/Time        HGB  7.5 (L)  12/05/2019 05:29 AM                                        HPI:   Lindsey Huynh is a 69 y.o.  female admitted on 11/23/2019  for  Gangrene ([place]) [I96].   has a past medical history of Diabetes ([place]), GERD (gastroesophageal reflux disease), Hypercholesterolemia, and Hypertension.        Presented for left foot pain and swelling.  Dx with left great toe gangrene.  Plan is for toe amputation and biopsy today.   She has no h/o CAD, MI or CHF.  She is POD #6 from leg leg bypass with Dr. Timmothy Sours.  She state she has FmHx for DM, but no MI, CAD or CHF.  Used to smoke, but quit 20-30 years ago.   She is active.  Before her left leg bypass and foot issues, she walked daily, at least 5 blocks.  She also is an active bowler.   She is on a team and league bowls for money.   H/o HTN, DM, HLD, GERD and PVD.      Subjective:   Offers no complaints.  No c/o CP or SOB with activities (walking or bowling).  Denies dizziness, palpitations or leg edema          Assessment and Plan        1. Left great toe osteomyelitis    - plan for amputation today    - IV ABx    - ID consulted by Primary team   2. Gangrene    - Left 2nd toe    - s/p fem-pop bypass on 11/28/19    - Vasc surgery following     - continues with surgical staples LLE   3. Anemia    - Hgb 7.6    - Occult ordered   4. DM II    - A1c 8.1    - per Primary team   5.  HTN    - Moderate control    - PTA Norvasc 10, HCTZ 25, Cozaar 100,    6. HLD       Cholesterol, total  219 (H)  10/30/2018 10:27 AM     HDL Cholesterol  63  10/30/2018 10:27 AM  LDL, calculated  130 (H)  10/30/2018 10:27 AM     VLDL, calculated  26  10/30/2018 10:27 AM     Triglyceride  131  10/30/2018 10:27 AM      - No on statin - recommend starting Lipitor      7. GERD    - Protonix 40 mg       No c/o CP, SOB or edema.  She is active with daily walking and bowls on a league (averages 140s), no CP or SOB with activities.   No family h/o CAD, MI or CHF - just DM.  She does have RF - HTN, DM, HLD, tobacco exposure, post menopausal and PVD;   No indication at this time for cardiac ischemia with activities or at rest.  EKG normal.  Will check echo.      I have no objection to the planned surgery. Patient seems to be at a low risk for peri-operative severe adverse cardiac events.           Patient Active Problem List        Diagnosis  Code         ?  HTN (hypertension)  I10     ?  GERD (gastroesophageal reflux disease)  K21.9     ?  Hypercholesterolemia  E78.00     ?  Diabetes ([place])  E11.9     ?  Constipation  K59.00     ?  Postmenopausal  Z78.0     ?  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  Cataract of left eye  H26.9     ?  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  Type 2 diabetes mellitus with diabetic neuropathy (HCC)  E11.40         ?  Gangrene ([place])  I96        No specialty comments available.         Review of Systems:     []  Patient unable to provide secondary to  condition      [x]  All systems negative, except as checked  below.     Constitutional:     [] Weight Change  [] Fever   []  Chills   [] Night Sweats  [] Fatigue  []  Malaise  [] ____   ENT/Mouth:      [] Hearing Changes  [] Ear Pain  []  Nasal Congestion   [] Sinus Pain  [] Hoarseness    [] Sore throat  [] Rhinorrhea  []  Swallowing Difficulty  [] ____   Eyes:     [] Eye Pain  [] Swelling  []  Redness  [] Foreign Body  [] Discharge  []  Vision Changes  [] ____    Cardiovascular:     [] Chest Pain  [] SOB  []  PND  [] DOE  [] Orthopnea  []  Claudication  [] Edema    [] Palpitations  [] ____   Respiratory:     [] Cough  [] Sputum  []  Wheezing,  [] SOB  [] Hemoptysis  []  ____   Gastrointestinal:     [] Nausea  [] Vomiting  []  Diarrhea  [] Constipation  [] Pain  []  Heartburn  [] Anorexia   [] Dysphagia  [] Hematochezia  []  Melena,  [] Jaundice  [] ____   Genitourinary:     [] Dysuria  [] Urinary Frequency  []  Hematuria  [] Urinary Incontinence  [] Urgency   [] Flank Pain  [] Hesitancy  []  ____   Musculoskeletal:     [] Arthralgias  [] Myalgias  []  Joint Swelling  [] Joint Stiffness  [] Back Pain  []  Neck Pain     [  x]LLE pain___   Skin:     [] Skin Lesions  [] Pruritis  []  Hair Changes  [] Skin rashes  [] ____   Neuro:     [] Weakness  [] Numbness  []  Paresthesias  [] Loss of Consciousness  [] Syncope    [] Dizziness  [] Headache  []  Coordination Changes  [] Recent Falls  [] ____   Psych:     [] Anxiety/Depression  [] Insomnia  []  Memory Changes  [] Violence/Abuse Hx.  [] ____   Heme/Lymph:     [] Bruising  [] Bleeding  []  Lymphadenopathy  [] ____   Endocrine:     [] Polyuria  [] Polydipsia  []  Temperature Intolerance  [] ____              Previous treatment/evaluation includes    none   Cardiac risk factors:    smoking/ tobacco exposure, dyslipidemia, diabetes mellitus, sedentary life style, hypertension, post-menopausal.        Past Medical History:        Diagnosis  Date         ?  Diabetes ([place])            insulin + metformin         ?  GERD (gastroesophageal reflux disease)       ?  Hypercholesterolemia           ?  Hypertension            Past Surgical History:         Procedure  Laterality  Date          ?  HX COLONOSCOPY         ?  HX ORTHOPAEDIC    06-30-10          back surgery (tumor removed)          ?  HX TUMOR REMOVAL    06/30/10          under spinal cord          Current Facility-Administered Medications          Medication  Dose  Route  Frequency           ?  potassium chloride SR (KLOR-CON 10) tablet 40 mEq   40  mEq  Oral  BID     ?  cefepime (MAXIPIME) 2 g in 0.9% sodium chloride (MBP/ADV) 100 mL MBP   2 g  IntraVENous  Q8H     ?  insulin glargine (LANTUS) injection 12 Units   12 Units  SubCUTAneous  QHS     ?  vancomycin (VANCOCIN) 1250 mg in NS 250 ml infusion   1,250 mg  IntraVENous  Q16H     ?  acetaminophen (TYLENOL) tablet 650 mg   650 mg  Oral  Q6H     ?  gabapentin (NEURONTIN) capsule 300 mg   300 mg  Oral  QHS     ?  gabapentin (NEURONTIN) capsule 100 mg   100 mg  Oral  DAILY     ?  Vancomycin- Pharmacy to Dose     Other  Rx Dosing/Monitoring     ?  metroNIDAZOLE (FLAGYL) IVPB premix 500 mg   500 mg  IntraVENous  Q12H     ?  sodium chloride (NS) flush 5-40 mL   5-40 mL  IntraVENous  Q8H     ?  sodium chloride (NS) flush 5-40 mL   5-40 mL  IntraVENous  PRN     ?  sodium phosphate (FLEET'S) enema 1 Enema  1 Enema  Rectal  PRN           ?  oxyCODONE IR (ROXICODONE) tablet 5 mg   5 mg  Oral  Q4H PRN           ?  losartan (COZAAR) tablet 100 mg   100 mg  Oral  DAILY     ?  docusate sodium (COLACE) capsule 100 mg   100 mg  Oral  BID     ?  senna (SENOKOT) tablet 8.6 mg   1 Tab  Oral  DAILY     ?  polyethylene glycol (MIRALAX) packet 17 g   17 g  Oral  DAILY PRN     ?  bisacodyL (DULCOLAX) suppository 10 mg   10 mg  Rectal  DAILY PRN     ?  hydroCHLOROthiazide (HYDRODIURIL) tablet 25 mg   25 mg  Oral  DAILY     ?  sodium chloride (NS) flush 5-40 mL   5-40 mL  IntraVENous  Q8H     ?  sodium chloride (NS) flush 5-40 mL   5-40 mL  IntraVENous  PRN     ?  acetaminophen (TYLENOL) suppository 650 mg   650 mg  Rectal  Q6H PRN     ?  promethazine (PHENERGAN) tablet 12.5 mg   12.5 mg  Oral  Q6H PRN          Or           ?  ondansetron (ZOFRAN) injection 4 mg   4 mg  IntraVENous  Q6H PRN     ?  glucose chewable tablet 16 g   4 Tab  Oral  PRN     ?  dextrose (D50W) injection syrg 12.5-25 g   25-50 mL  IntraVENous  PRN     ?  glucagon (GLUCAGEN) injection 1 mg   1 mg  IntraMUSCular  PRN     ?  insulin lispro (HUMALOG) injection      SubCUTAneous  AC&HS     ?  aspirin delayed-release tablet 81 mg   81 mg  Oral  DAILY     ?  pantoprazole (PROTONIX) tablet 40 mg   40 mg  Oral  ACB     ?  amLODIPine (NORVASC) tablet 10 mg   10 mg  Oral  DAILY           ?  [Held by provider] enoxaparin (LOVENOX) injection 40 mg   40 mg  SubCUTAneous  Q24H            Allergies        Allergen  Reactions         ?  Neuromuscular Blockers, Steroidal  Other (comments)             GI Upset         History reviewed. No pertinent family history.      Social History          Socioeconomic History         ?  Marital status:  SINGLE              Spouse name:  Not on file         ?  Number of children:  Not on file     ?  Years of education:  Not on file     ?  Highest education level:  Not on file  Tobacco Use         ?  Smoking status:  Former Smoker              Years:  10.00         ?  Smokeless tobacco:  Never Used       Substance and Sexual Activity         ?  Alcohol use:  Yes              Alcohol/week:  5.0 standard drinks         Types:  6 Cans of beer per week         ?  Drug use:  No         ?  Sexual activity:  Not Currently           Objective:     Physical Exam      Vitals:      Vitals:             12/04/19 1407  12/04/19 2106  12/05/19 0223  12/05/19 0824           BP:  132/72  (!) 152/63  128/71  136/77     Pulse:  88  85  73  82     Resp:  17  16  16  16      Temp:  98.7 ??F (37.1 ??C)  98.8 ??F (37.1 ??C)  97.3 ??F (36.3 ??C)  98.2 ??F (36.8 ??C)     SpO2:  98%  100%  98%  98%     Weight:                   Height:                      General:     Alert, cooperative, no distress, appears stated age.        Neck:    Supple,  no JVD.     Back:      Symmetric, normal curvature.      Lungs:      clear to auscultation bilaterally.     Heart::     Regular rate and rhythm, S1, S2 normal, no murmur, click, rub or gallop.     Abdomen:      Soft, non-tender. Bowel sounds normal.      Extremities:    Extremities normal, atraumatic, no cyanosis or edema. LLE with surgical  staples     Vascular:    Pulses - 2+ radials     Skin:    Skin color normal. No rashes or lesions        Neurologic:    CN II-XII grossly intact.            Telemetry:       ECG:      EKG Results               Procedure  720  Value  Units  Date/Time           EKG, 12 LEAD, INITIAL [[id number]]  Collected: 11/28/19 1053       Order Status: Completed  Updated: 11/29/19 0609                Ventricular Rate  77  BPM           Atrial Rate  77  BPM           P-R  Interval  132  ms           QRS Duration  88  ms           Q-T Interval  374  ms           QTC Calculation (Bezet)  423  ms           Calculated P Axis  35  degrees           Calculated R Axis  -2  degrees           Calculated T Axis  129  degrees                Diagnosis  --             Sinus rhythm with frequent premature ventricular complexes   Left ventricular hypertrophy with repolarization abnormality   No previous ECGs available   Confirmed by Marlon Pel, M.D., Altha Harm [phone]) on 11/29/2019 6:09:48 AM                      Data Review:       Radiology:    XR Results (most recent):     Results from [facility] encounter on 11/23/19     [facility] XR TECHNOLOGIST SERVICE           Narrative  Fluoroscopy was utilized.                Impression  FLUOROSCOPY WAS USED.      Fluoro Dose:  144.09 mGy.         vk              No results for input(s): CPK, TROIQ in the last 72 hours.      No lab exists for component: CKQMB, CPKMB, BMPP     Recent Labs            12/05/19   0529  12/04/19   0457     NA  136  138     K  3.2*  3.2*     CL  104  102     CO2  27  25     BUN  14  13     CREA  0.77  0.87     GLU  77  70         CA  9.2  9.1          Recent Labs            12/05/19   0529  12/04/19   0457     WBC  5.4  5.1     HGB  7.5*  7.8*     HCT  22.1*  22.6*         PLT  299  344        No results for input(s): PTP, INR, AP, INREXT in the last 72 hours.      No lab exists for component: PTTP, GPT, SGOT   No results for input(s): CHOL, LDLC in the last 72 hours.      No lab  exists for component: TGL, HDLC,  HBA1C   No results for input(s): CRP, TSH, TSHEXT in the last 72 hours.      No lab exists for component: ESR      Gildardo Pounds. Nadara Mustard, PA-C   Madolyn Frieze, MD  [facility] [place], [address]      [place], [place]      [phone]      JX:BJYN, Gae Gallop, NP

## 2019-12-05 NOTE — Anesthesia Post-Procedure Evaluation (Signed)
Post-Anesthesia Evaluation and Assessment    Patient: Lindsey Huynh MRN: [id number]  SSN: [id number]    Date of Birth: Jun 06, 1951  Age: 69 y.o.  Sex: female      I have evaluated the patient and they are stable and ready for discharge from the PACU.     Cardiovascular Function/Vital Signs  Visit Vitals  BP (!) 170/73 (BP 1 Location: Left upper arm, BP Patient Position: At rest)   Pulse 85   Temp 36.9 ??C (98.4 ??F)   Resp 16   Ht 5\' 5"  (1.651 m)   Wt 76.7 kg (169 lb)   SpO2 100%   BMI 28.12 kg/m??       Patient is status post General anesthesia for Procedure(s):  LEFT TRANSMETATARSAL AMPUTATION WITH TENDON BALANCING.    Nausea/Vomiting: None    Postoperative hydration reviewed and adequate.    Pain:  Pain Scale 1: Numeric (0 - 10) (12/06/19 0929)  Pain Intensity 1: 10 (12/06/19 0929)   Managed    Neurological Status:   Neuro (WDL): Within Defined Limits (12/05/19 2122)  Neuro  Neurologic State: Alert (12/05/19 2226)  Orientation Level: Oriented X4 (12/05/19 2226)  Cognition: Appropriate safety awareness;Appropriate for age attention/concentration;Appropriate decision making (12/05/19 2226)  Speech: Clear (12/05/19 2226)  LUE Motor Response: Purposeful (12/06/19 0350)  LLE Motor Response: Purposeful (12/06/19 0350)  RUE Motor Response: Purposeful (12/06/19 0350)  RLE Motor Response: Purposeful (12/06/19 0350)   At baseline    Mental Status, Level of Consciousness: Alert and  oriented to person, place, and time    Pulmonary Status:   O2 Device: Room air (12/05/19 2122)   Adequate oxygenation and airway patent    Complications related to anesthesia: None    Post-anesthesia assessment completed. No concerns    Signed By: Beryle Quant, MD     December 06, 2019              Procedure(s):  LEFT TRANSMETATARSAL AMPUTATION WITH TENDON BALANCING.    general    <BSHSIANPOST>    INITIAL Post-op Vital signs:   Vitals Value Taken Time   BP 147/74 12/05/19 2200   Temp 36.6 ??C (97.8 ??F) 12/05/19 2122   Pulse 83 12/05/19 2204   Resp  16 12/05/19 2204   SpO2 94 % 12/05/19 2204   Vitals shown include unvalidated device data.

## 2019-12-05 NOTE — Progress Notes (Signed)
Per Dr. Duwaine Maxin, patient can have medications with small sips of water and hold AM aspirin.    [id number] - Per Dr. Katy Fitch, okay to give am BP medications.

## 2019-12-05 NOTE — Group Note (Signed)
 Diabetes Mgmt by Moises Rosaline LABOR, CNS at 12/05/19 1431                Author: Moises Rosaline LABOR, CNS  Service: Certified Clinical Nurse Specialist  Author Type: Clinical Nurse Specialist       Filed: 12/05/19 1437  Date of Service: 12/05/19 1431  Status: Signed          Editor: Moises Rosaline LABOR, CNS (Clinical Nurse Specialist)               [place]   PROGRAM FOR DIABETES HEALTH      CLINICAL NURSE SPECIALIST CONSULT       FOLLOW UP  NOTE     Initial Presentation     Lindsey Huynh is a 69 y.o.  female admitted over weekend with left foot pain . Left great toe gangrenous.   Initial work up and evaluation reveals NO osteomyelitits.        HX:      Past Medical History:        Diagnosis  Date         ?  Diabetes (HCC)            insulin  + metformin          ?  GERD (gastroesophageal reflux disease)       ?  Hypercholesterolemia           ?  Hypertension              DX: CT-no osteo/ Xray-unremarkable      TX: Vascular surgery consulted- left leg medially with healing ulcerations consistent with venous ulcer. Plan for flow studies today.         Hospital course     Clinical progress has been complicated by venous insufficiency - BG trends mostly within goal during admission.   S/p Fem-Pop bypass on 3/11 and recovering well-Dr. Earvin following   Podiatry following for left 1st and 2nd toe diabetic ulcers with necrotic eschar present. NO surgical intervention chosen at this time-treating conservatively.        12/05/19: Patient elected for amputation of left 1st and 2nd toe-cardiology cleared, for OR today.       Diabetes      Patient has known Type 2 diabetes, treated with Levemir and Metformin   PTA.       Admission BG 170 and A1c 8.15 indicate more acceptable diabetes control. Previous A1C 03/2019 11.0% . Highest A1C Feb. 2020-12.1%.    Ambulatory blood glucose management provided by primary care provider-Last seen 11/05/2019 virtual visit   Consulted by Provider for advanced diabetes nursing  assessment and care, specifically related  to    []  Transitioning off Glucostabilizer    []  Inpatient management strategy   [x]  Home management assessment   []  Survival skill education      Diabetes-related medical history   Acute complications   NONE   Neurological complications   Peripheral neuropathy   Microvascular disease   NONE   Macrovascular disease   Peripheral vascular disease and Foot wounds   Other associated conditions      HTN/hypercholesteremia/      Diabetes medication history        Drug class  Currently in use  Discontinued  Never used          Biguanide  Metformin  1000mg  daily in divided doses              DDP-4 inhibitor  Sulfonylurea                Thiazolidinedione                GLP-1 RA                SGLT-2 inhibitors                Basal insulin   Levemir 20units daily              Bolus insulin                 Fixed Dose  Combinations                Subjective     S/p fem/pop bypass 11/28/19-recovering well.  For OR today for left 1st and 2nd toe amputation.          Patient reports the following home diabetes self-care practices:  Eating pattern-cut out sugar, made some diet changes, with carb intake, especially breads.       Physical activity pattern-desires to be physically active, but recent pain in left leg has her unable to do any physical activity      Monitoring pattern-checks every other day before and after meals.        Taking medications pattern   [x]  Consistent administration   [x]  Affordable      Social determinants of health impacting diabetes self-management practices    Concerned that you need to know more about how to stay healthy with diabetes         Objective     Physical exam   General Alert, oriented and in no acute distress. Conversant and cooperative.    Vital Signs    Visit Vitals      BP  131/77 (BP 1 Location: Left upper arm)     Pulse  78     Temp  98.3 F (36.8 C)     Resp  16     Ht  5' 5 (1.651 m)     Wt  76.9 kg (169 lb 8 oz)     SpO2  98%         BMI  28.21 kg/m        Skin  Warm and dry.     Heart   Regular rate and rhythm. No murmurs, rubs or gallops   Lungs  Clear to auscultation without rales or rhonchi   Extremities      Diabetic foot exam:     Left Foot-=+2 edema noted left lower leg       Visual Exam: ulcer- left great toe under nail and 2nd toe-black eschar noted    Pulse DP: 1+ (weak)    Filament test: reduced sensation        Right Foot    Visual Exam: normal     Pulse DP: 2+ (normal)    Filament test: reduced sensation      DP & PT pulses +2.       Laboratory   BMP:      Lab Results         Component  Value  Date/Time            NA  136  12/05/2019 05:29 AM       K  3.2 (L)  12/05/2019 05:29 AM       CL  104  12/05/2019 05:29 AM       CO2  27  12/05/2019  05:29 AM       AGAP  5  12/05/2019 05:29 AM       GLU  77  12/05/2019 05:29 AM       BUN  14  12/05/2019 05:29 AM       CREA  0.77  12/05/2019 05:29 AM       GFRAA  >60  12/05/2019 05:29 AM            GFRNA  >60  12/05/2019 05:29 AM         Factors impacting BG management       Factor  Dose  Comments         Nutrition:   Carb-controlled meals        60 grams/meal          Pain  Left foot           Infection  IV abx (vanc/ cefepime)           Other: venous insuffiencey   Vascular surgery following  S/p fem pop 11/28/19        Blood glucose pattern                Assessment and Plan        Nursing Diagnosis  Risk for unstable blood glucose pattern     Nursing Intervention Domain  5250 Decision-making Support        Nursing Interventions  Examined current inpatient diabetes control    Explored factors facilitating and impeding inpatient management   Identified self-management practices impeding diabetes control   Explored corrective strategies with patient and responsible inpatient provider    Informed patient of rational for insulin  strategy while hospitalized      Instructed patient in importance of checking blood glucose DAILY before meals - discussed when she should check her BG after  meals-2hours          Evaluation     [name], with Type 2 diabetes, did not achieve diabetes control prior to admission, as evidenced by admission BG of 170 and A1c of 81%.  She is followed closely by her PCP  for continued diabetes management.  She last saw PCP via virtual visit in Feb. 2021. Her A1C is improved since July 2020 when it was >11%. She is concerned about the fate of her left foot, but is encouraged that her A1C has lowered significantly since  July 2020.  S/p fem/pop bypass on 3/11 and recovering.  Slated for rehab in coming days.       NPO today for surgery.  Fasting BG 99mg /dl. Pre-prandial 90mg /dl.         Recommendations        [x]  Use of  Subcutaneous Insulin  Order set [phone])       Insulin   Dosing  Specific recommendation         CONTINUE   Basal                               (Based on weight, BMI & GFR)  12 units daily (20% reduction)           CONTINUE Corrective                                       (Useful in adjusting insulin  dosing)  [x]   Normal sensitivity  Discharge Planning     1.   Discharge on hospital doses of insulin .   2. Re-start Metformin  at PTA doses        Billing Code(s)     [x]  99231 IP subsequent hospital  care - 15 minutes      Before making these care recommendations, I personally reviewed the hospitalization record, including notes, laboratory & diagnostic data and current medications, and    examined the patient at the bedside (circumstances permitting) before making care recommendations.      Total minutes: 15      Rosaline DELENA Bangs, CNS   Diabetes Clinical Nurse Specialist   Program for Diabetes Health   Access via Perfect Serve

## 2019-12-05 NOTE — Progress Notes (Signed)
Progress Notes by [name], MD at 12/05/19 [phone]                Author: [name], MD  Service: Hospitalist  Author Type: Physician       Filed: 12/05/19 0925  Date of Service: 12/05/19 0922  Status: Signed          Editor: [name], MD (Physician)                    [facility]  Hospitalist [facility]                                                                                              Hospitalist Progress Note   [name], MD   Answering service: [phone] from in house phone            Date of Service:  12/05/2019   NAME:  Lindsey Huynh   DOB:  1951-01-26   MRN:  [id number]           Admission Summary:        From H&P 11/23/2019:   "Lindsey Huynh??is a 69 y.o.??female??with past medical history of diabetes, insulin-dependent, GERD and hypertension comes for return of left foot pain and swelling.      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 First distal phalanx osteomyelitis.   2. No abscess.   - IV antibiotics   - Vascular surgery following- Patient to follow up in 2 weeks post- op for staple removal.    - Consult ID for antibiotic recommendations    - Podiatry following - plan for amputation of toe today   ??   LLE pain:??dry gangrene??of??left 2nd toe cellulitic changes/Left popliteal occlusion   -s/p left fem-pop bypass (03/11)   -CT LLE (03/06) NL   -Elevated Sed rate and CRP   -BC (03/06) NGTD    -Pain control   - Podiatry??following    - MRI - 1. First distal phalanx osteomyelitis.. No abscess.   ??   Anemia    Stable, No sign of bleeding noted.    - hgb 7.6 today - Monitor labs and transfuse if hgb <7.0    ??   DMII:??hold metformin while IP   -Cont basal insulin, SSI   -Diabetic diet    -a1c 8.1   - DM following??   ??   HTN   -Controlled   -Cont home meds    ??   GERD   -Cont PPI   ??   Hypokalemia   -potassium 3.2   - Replaced       Code status: Full    DVT prophylaxis: Lovenox ( hold  for surgery )       Care Plan discussed with: Patient/Family, Nurse and Case Manager   Anticipated Disposition: SNF/LTC   Anticipated Discharge: 24 hours to 48 hours           Hospital Problems   Date Reviewed:  05/31/2018                         Codes  Class  Noted  POA              Gangrene ([place])  ICD-10-CM: [id number]   ICD-9-CM: 785.4    11/23/2019  Unknown                               Review of Systems:     A comprehensive review of systems was negative except for that written in the HPI.            Vital Signs:      Last 24hrs VS reviewed since prior progress note. Most recent are:   Visit Vitals      BP  136/77 (BP Patient Position: At rest)     Pulse  82     Temp  98.2 ??F (36.8 ??C)     Resp  16     Ht  5\' 5"  (1.651 m)     Wt  76.9 kg (169 lb 8 oz)     SpO2  98%        BMI  28.21 kg/m??           No intake or output data in the 24 hours ending 12/05/19 [id number]         Physical Examination:                     Constitutional:   No acute distress, cooperative, pleasant, answers questions     ENT:   Oral mucosa moist, oropharynx benign.      Resp:   CTA bilaterally. No wheezing/rhonchi/rales. No accessory muscle use     CV:   Regular rhythm, normal rate, no murmurs, gallops, rubs         GI:   Soft, non distended, non tender. normoactive bowel sounds         Musculoskeletal:   No edema, warm, 2+ pulses throughout         Neurologic:   Moves all extremities.  AAOx3, CN II-XII reviewed, follows commands       Psych:  Good insight, Not anxious nor agitated.    Skin:????Good turgor, no rashes or ulcers and Four left leg surgical incisions noted, staples intact. No signs of infection noted     ??                       Data Review:      Review and/or order of clinical lab test   Review and/or order of tests in the medicine section of  CPT           Labs:          Recent Labs            12/05/19   0529  12/04/19   0457     WBC  5.4  5.1     HGB  7.5*  7.8*     HCT  22.1*  22.6*  PLT  299  344          Recent Labs             12/05/19   0529  12/04/19   0457  12/03/19   0543     NA  136  138  137     K  3.2*  3.2*  3.6     CL  104  102  102     CO2  27  25  25      BUN  14  13  14      CREA  0.77  0.87  0.94     GLU  77  70  80          CA  9.2  9.1  9.9        No results for input(s): ALT, AP, TBIL, TBILI, TP, ALB, GLOB, GGT, AML, LPSE in the last 72 hours.      No lab exists for component: SGOT, GPT, AMYP, HLPSE   No results for input(s): INR, PTP, APTT, INREXT, INREXT in the last 72 hours.    No results for input(s): FE, TIBC, PSAT, FERR in the last 72 hours.    No results found for: FOL, RBCF    No results for input(s): PH, PCO2, PO2 in the last 72 hours.   No results for input(s): CPK, CKNDX, TROIQ in the last 72 hours.      No lab exists for component: CPKMB     Lab Results         Component  Value  Date/Time            Cholesterol, total  219 (H)  10/30/2018 10:27 AM       HDL Cholesterol  63  10/30/2018 10:27 AM       LDL, calculated  130 (H)  10/30/2018 10:27 AM       Triglyceride  131  10/30/2018 10:27 AM            CHOL/HDL Ratio  3.4  12/26/2007 09:12 AM          Lab Results         Component  Value  Date/Time            Glucose (POC)  99  12/05/2019 05:52 AM       Glucose (POC)  135 (H)  12/04/2019 09:04 PM       Glucose (POC)  129 (H)  12/04/2019 04:00 PM       Glucose (POC)  166 (H)  12/04/2019 11:44 AM            Glucose (POC)  97  12/04/2019 06:58 AM        No results found for: COLOR, APPRN, SPGRU, REFSG, PHU, PROTU, GLUCU, KETU, BILU, UROU, NITU, LEUKU, GLUKE, EPSU, BACTU, WBCU, RBCU, CASTS, UCRY           Medications Reviewed:          Current Facility-Administered Medications          Medication  Dose  Route  Frequency           ?  cefepime (MAXIPIME) 2 g in 0.9% sodium chloride (MBP/ADV) 100 mL MBP   2 g  IntraVENous  Q8H      ?  insulin glargine (LANTUS) injection 12 Units   12 Units  SubCUTAneous  QHS           ?  vancomycin (VANCOCIN)  1250 mg in NS 250 ml infusion   1,250 mg  IntraVENous  Q16H           ?  acetaminophen (TYLENOL) tablet 650 mg   650 mg  Oral  Q6H     ?  gabapentin (NEURONTIN) capsule 300 mg   300 mg  Oral  QHS     ?  gabapentin (NEURONTIN) capsule 100 mg   100 mg  Oral  DAILY     ?  Vancomycin- Pharmacy to Dose     Other  Rx Dosing/Monitoring     ?  metroNIDAZOLE (FLAGYL) IVPB premix 500 mg   500 mg  IntraVENous  Q12H     ?  sodium chloride (NS) flush 5-40 mL   5-40 mL  IntraVENous  Q8H     ?  sodium chloride (NS) flush 5-40 mL   5-40 mL  IntraVENous  PRN     ?  sodium phosphate (FLEET'S) enema 1 Enema   1 Enema  Rectal  PRN     ?  oxyCODONE IR (ROXICODONE) tablet 5 mg   5 mg  Oral  Q4H PRN     ?  losartan (COZAAR) tablet 100 mg   100 mg  Oral  DAILY     ?  docusate sodium (COLACE) capsule 100 mg   100 mg  Oral  BID     ?  senna (SENOKOT) tablet 8.6 mg   1 Tab  Oral  DAILY     ?  polyethylene glycol (MIRALAX) packet 17 g   17 g  Oral  DAILY PRN     ?  bisacodyL (DULCOLAX) suppository 10 mg   10 mg  Rectal  DAILY PRN     ?  hydroCHLOROthiazide (HYDRODIURIL) tablet 25 mg   25 mg  Oral  DAILY     ?  sodium chloride (NS) flush 5-40 mL   5-40 mL  IntraVENous  Q8H     ?  sodium chloride (NS) flush 5-40 mL   5-40 mL  IntraVENous  PRN     ?  acetaminophen (TYLENOL) suppository 650 mg   650 mg  Rectal  Q6H PRN     ?  promethazine (PHENERGAN) tablet 12.5 mg   12.5 mg  Oral  Q6H PRN          Or           ?  ondansetron (ZOFRAN) injection 4 mg   4 mg  IntraVENous  Q6H PRN     ?  glucose chewable tablet 16 g   4 Tab  Oral  PRN     ?  dextrose (D50W) injection syrg 12.5-25 g   25-50 mL  IntraVENous  PRN     ?  glucagon (GLUCAGEN) injection 1 mg   1 mg  IntraMUSCular  PRN     ?  insulin lispro (HUMALOG) injection     SubCUTAneous  AC&HS     ?  aspirin delayed-release tablet 81 mg   81 mg  Oral  DAILY     ?  pantoprazole  (PROTONIX) tablet 40 mg   40 mg  Oral  ACB     ?  amLODIPine (NORVASC) tablet 10 mg   10 mg  Oral  DAILY           ?  [Held by provider] enoxaparin (LOVENOX) injection 40 mg   40 mg  SubCUTAneous  Q24H        ______________________________________________________________________  EXPECTED LENGTH OF STAY: 4d 2h   ACTUAL LENGTH OF STAY:          12                    [name], MD

## 2019-12-05 NOTE — Op Note (Signed)
Operative Report  ??  Procedure date: 12/05/2019   ??  Surgeon:Markeria Goetsch DPM   ??  1st Asst: none  ??  Preop diagnosis: left foot chronic osteomyelitis; left ankle equinus  ??  Post op diagnosis: left foot chronic osteomyelitis; left ankle equinus  ??  Procedure: left foot transmetatarsal amputation with percutaneous TAL  ??  Pathology: proximal margin left first metatarsal    Microbiology: bone tissue left distal phalanx   ??  Anesthesia: General with local block  ??  Hemostasis: anatomic dissection  ??  Estimated Blood loss: 20 cc  ??  Materials: 2-0 prolene, 3-0 vicryl, ????? Penrose drain  ??  Injectables: none  ??  Complications: none  ??  Description of procedure:  Pre-operative patient identification was carried out by myself, then the patient was brought to the operating room and placed on the operating table in a supine position. After adequate anesthesia was obtained the  LLE was then prepped and draped in the normal sterile fashion.    Tourniquet inflated sterile fashion surrounding the left ankle.     Dissection was carried out using #10 blade surrounding the necrotic left hallux, wound base was noted to be fibrotic, necrotic with minimal bleeding tissue. Surrounding skin and subcutaneous tissue noted to be nonviable. The decision was made intraoperatively to perform transmetatarsal amputation due to lack of viable tissue noted to the first ray.       Utilizing a 10 blade a fish mouth type incision was made extending from the midshaft of the 1st metatarsal, encompassing the wound, and extending to the midshaft of the 5th metatarsal. Tissues were further freed with Mayo scissors, the #10 blade, and a periosteal elevator.  ??  Next, a sagittal saw was utilized to make cuts at the distal 1/3 of each of the metatarsal shafts. The cuts were angled and created a proper metatarsal parabola for the remnant metatarsal stumps.   ??  Next a #10 blade was used to remove the remaining necrotic soft tissue, sesamoid bones,  plantar plates, and tendons.      The exposed bone was noted to be white and hard, and the soft tissues granular, and both bled well.    During the procedure no purulence was noted. Wound base noted to be viable.  ??  All bleeders were cauterized.    Satisfied with the level of resection and that there was no more non-viable tissue, the wound was irrigated with NSS and closed sequentially around a ????? penrose drain with 3-0 prolene.    Three stab incisions were made beginning 1 cm proximal to the Achilles tendon insertion laterally, then another 3 cm proximal medially, then another 6 cm proximal to the first laterally, with a #11 blade, which was then used to transect partially the Achilles tendon while dorsiflexory force was applied to the foot. Satisfied with the release of the patient???s equinus, the wounds were irrigated then closed with 3-0 vicryl and were covered with sterile gauze.  ????      The patient tolerated the procedure and anesthesia well, and was transported from the operating room to the PACU with vital signs stable and with the neurovascular status of the operative foot intact. This procedure is part of a series of staged procedures in an attempt at limb salvage.

## 2019-12-05 NOTE — Op Note (Signed)
Brief Postoperative Note    Patient: Lindsey Huynh  Date of Birth: 04-Dec-1950  MRN: [id number]    Date of Procedure: 12/05/2019     Pre-Op Diagnosis: CHRONIC OSTEOMYELITIS LEFT FOOT    Post-Op Diagnosis: Same as preoperative diagnosis.      Procedure(s):  LEFT TRANSMETATARSAL AMPUTATION WITH PERCUTANEOUS ACHILLES TENDON LENGTHENING    Surgeon(s):  [name], DPM    Surgical Assistant: None    Anesthesia: General     Estimated Blood Loss (mL): less than 50     Complications: None    Specimens:   ID Type Source Tests Collected by Time Destination   1 : PROXIMAL MARGIN LEFT FIRST RAY Fresh Foot, left  [name], [facility] 12/05/2019 1940 Pathology   2 : LEFT FOREFOOT Fresh Foot, left  [name], [facility] 12/05/2019 1959 Pathology   1 : LEFT DISTAL PHALYNX Bone Foot, left CULTURE, AEROBIC AND ANAEROBIC [name], [facility] 12/05/2019 1940 Microbiology   2 : LEFT DISTAL PHALYNX Bone Foot, left CULTURE, AEROBIC AND ANAEROBIC [name], [facility] 12/05/2019 1957 Microbiology        Implants: * No implants in log *    Drains: * No LDAs found *    Findings: necrosis noted to subcutaneous tissue of wound base surrounding hallux    Electronically Signed by [name], DPM on 12/05/2019 at 8:49 PM

## 2019-12-05 NOTE — Progress Notes (Signed)
 TRANSFER - IN REPORT:    Verbal report received from [facility] (name) on Lindsey Huynh  being received from [facility] (unit) for ordered procedure      Report consisted of patient's Situation, Background, Assessment and   Recommendations(SBAR).     Information from the following report(s) SBAR, Kardex, Intake/Output and MAR was reviewed with the receiving nurse.    Opportunity for questions and clarification was provided.      Assessment completed upon patient's arrival to unit and care assumed.

## 2019-12-05 NOTE — Anesthesia Pre-Procedure Evaluation (Signed)
Relevant Problems   CARDIOVASCULAR   (+) HTN (hypertension)      GASTROINTESTINAL   (+) GERD (gastroesophageal reflux disease)      ENDOCRINE   (+) Diabetes (HCC)   (+) Type 2 diabetes mellitus with diabetic neuropathy (HCC)   (+) Type 2 diabetes with nephropathy (HCC)       Anesthetic History   No history of anesthetic complications            Review of Systems / Medical History  Patient summary reviewed, nursing notes reviewed and pertinent labs reviewed    Pulmonary  Within defined limits                 Neuro/Psych   Within defined limits           Cardiovascular    Hypertension          PAD         GI/Hepatic/Renal  Within defined limits   GERD           Endo/Other  Within defined limits  Diabetes         Other Findings              Physical Exam    Airway  Mallampati: II  TM Distance: > 6 cm  Neck ROM: normal range of motion   Mouth opening: Normal     Cardiovascular  Regular rate and rhythm,  S1 and S2 normal,  no murmur, click, rub, or gallop             Dental  No notable dental hx       Pulmonary  Breath sounds clear to auscultation               Abdominal  GI exam deferred       Other Findings            Anesthetic Plan    ASA: 3  Anesthesia type: general            Anesthetic plan and risks discussed with: Patient

## 2019-12-05 NOTE — Progress Notes (Addendum)
Per Dr. Duwaine Maxin, patient can have medications with small sips of water and hold AM aspirin.    [id number] - Per Dr. Katy Fitch, okay to give am BP medications.

## 2019-12-05 NOTE — Op Note (Signed)
Operative Report  ??  Procedure date: 12/05/2019   ??  Surgeon:Fredrico Beedle Uchmanowicz DPM   ??  1st Asst: none  ??  Preop diagnosis: left foot chronic osteomyelitis; left ankle equinus  ??  Post op diagnosis: left foot chronic osteomyelitis; left ankle equinus  ??  Procedure: left foot transmetatarsal amputation with percutaneous TAL  ??  Pathology: proximal margin left first metatarsal    Microbiology: bone tissue left distal phalanx   ??  Anesthesia: General with local block  ??  Hemostasis: anatomic dissection  ??  Estimated Blood loss: 20 cc  ??  Materials: 2-0 prolene, 3-0 vicryl, ????? Penrose drain  ??  Injectables: none  ??  Complications: none  ??  Description of procedure:  Pre-operative patient identification was carried out by myself, then the patient was brought to the operating room and placed on the operating table in a supine position. After adequate anesthesia was obtained the  LLE was then prepped and draped in the normal sterile fashion.    Tourniquet inflated sterile fashion surrounding the left ankle.     Dissection was carried out using #10 blade surrounding the necrotic left hallux, wound base was noted to be fibrotic, necrotic with minimal bleeding tissue. Surrounding skin and subcutaneous tissue noted to be nonviable. The decision was made intraoperatively to perform transmetatarsal amputation due to lack of viable tissue noted to the first ray.       Utilizing a 10 blade a fish mouth type incision was made extending from the midshaft of the 1st metatarsal, encompassing the wound, and extending to the midshaft of the 5th metatarsal. Tissues were further freed with Mayo scissors, the #10 blade, and a periosteal elevator.  ??  Next, a sagittal saw was utilized to make cuts at the distal 1/3 of each of the metatarsal shafts. The cuts were angled and created a proper metatarsal parabola for the remnant metatarsal stumps.   ??  Next a #10 blade was used to remove the remaining necrotic soft tissue, sesamoid bones,  plantar plates, and tendons.      The exposed bone was noted to be white and hard, and the soft tissues granular, and both bled well.    During the procedure no purulence was noted. Wound base noted to be viable.  ??  All bleeders were cauterized.    Satisfied with the level of resection and that there was no more non-viable tissue, the wound was irrigated with NSS and closed sequentially around a ????? penrose drain with 3-0 prolene.    Three stab incisions were made beginning 1 cm proximal to the Achilles tendon insertion laterally, then another 3 cm proximal medially, then another 6 cm proximal to the first laterally, with a #11 blade, which was then used to transect partially the Achilles tendon while dorsiflexory force was applied to the foot. Satisfied with the release of the patient???s equinus, the wounds were irrigated then closed with 3-0 vicryl and were covered with sterile gauze.  ????      The patient tolerated the procedure and anesthesia well, and was transported from the operating room to the PACU with vital signs stable and with the neurovascular status of the operative foot intact. This procedure is part of a series of staged procedures in an attempt at limb salvage.

## 2019-12-06 DIAGNOSIS — J301 Allergic rhinitis due to pollen: Secondary | ICD-10-CM | POA: Diagnosis not present

## 2019-12-06 DIAGNOSIS — J3089 Other allergic rhinitis: Secondary | ICD-10-CM | POA: Diagnosis not present

## 2019-12-06 LAB — CBC WITH AUTOMATED DIFF
ABS. BASOPHILS: 0.1 10*3/uL (ref 0.0–0.1)
ABS. EOSINOPHILS: 0.1 10*3/uL (ref 0.0–0.4)
ABS. IMM. GRANS.: 0.1 10*3/uL — ABNORMAL HIGH (ref 0.00–0.04)
ABS. LYMPHOCYTES: 0.6 10*3/uL — ABNORMAL LOW (ref 0.8–3.5)
ABS. MONOCYTES: 0.6 10*3/uL (ref 0.0–1.0)
ABS. NEUTROPHILS: 9.5 10*3/uL — ABNORMAL HIGH (ref 1.8–8.0)
ABSOLUTE NRBC: 0 10*3/uL (ref 0.00–0.01)
BASOPHILS: 1 % (ref 0–1)
EOSINOPHILS: 1 % (ref 0–7)
HCT: 28.7 % — ABNORMAL LOW (ref 35.0–47.0)
HGB: 9.6 g/dL — ABNORMAL LOW (ref 11.5–16.0)
IMMATURE GRANULOCYTES: 1 % — ABNORMAL HIGH (ref 0.0–0.5)
LYMPHOCYTES: 5 % — ABNORMAL LOW (ref 12–49)
MCH: 31.5 PG (ref 26.0–34.0)
MCHC: 33.4 g/dL (ref 30.0–36.5)
MCV: 94.1 FL (ref 80.0–99.0)
MONOCYTES: 5 % (ref 5–13)
MPV: 9.8 FL (ref 8.9–12.9)
NEUTROPHILS: 87 % — ABNORMAL HIGH (ref 32–75)
NRBC: 0 PER 100 WBC
PLATELET: 401 10*3/uL — ABNORMAL HIGH (ref 150–400)
RBC: 3.05 M/uL — ABNORMAL LOW (ref 3.80–5.20)
RDW: 13.1 % (ref 11.5–14.5)
WBC: 11 10*3/uL (ref 3.6–11.0)

## 2019-12-06 LAB — METABOLIC PANEL, BASIC
Anion gap: 13 mmol/L (ref 5–15)
BUN/Creatinine ratio: 18 (ref 12–20)
BUN: 16 MG/DL (ref 6–20)
CO2: 19 mmol/L — ABNORMAL LOW (ref 21–32)
Calcium: 9.8 MG/DL (ref 8.5–10.1)
Chloride: 101 mmol/L (ref 97–108)
Creatinine: 0.89 MG/DL (ref 0.55–1.02)
GFR est AA: >60 mL/min/{1.73_m2} (ref >60)
GFR est non-AA: >60 mL/min/{1.73_m2} (ref >60)
Glucose: 214 mg/dL — ABNORMAL HIGH (ref 65–100)
Potassium: 4.1 mmol/L (ref 3.5–5.1)
Sodium: 133 mmol/L — ABNORMAL LOW (ref 136–145)

## 2019-12-06 LAB — ECHO ADULT COMPLETE
AV Area by Peak Velocity: 2.66 cm2
AV Peak Gradient: 7.04 mmHg
AV Peak Velocity: 132.7 cm/s
AVA/BSA Peak Velocity: 1.4 cm2/m2
Aortic Root: 3.11 cm
E/E' Lateral: 9.6
E/E' Ratio (Averaged): 11.85
E/E' Septal: 14.11
EF BP: 68.1 percent (ref 55.0–100.0)
IVSd: 1.05 cm — AB (ref 0.60–0.90)
LA Area 4C: 14.79 cm2
LA Major Axis: 3.55 cm
LA Minor Axis: 1.93 cm
LA Volume 2C: 54.11 mL — AB (ref 22.00–52.00)
LA Volume 4C: 38.36 mL (ref 22.00–52.00)
LA Volume BP: 49.14 mL (ref 22.0–52.0)
LA Volume Index 2C: 29.38 ml/m2 (ref 16–28)
LA Volume Index 4C: 20.83 ml/m2 (ref 16–28)
LA Volume Index BP: 26.69 ml/m2 (ref 16–28)
LV E' Lateral Velocity: 8.23 cm/s
LV E' Septal Velocity: 5.6 cm/s
LV EDV A2C: 99.61 mL
LV EDV A4C: 84.71 mL
LV EDV BP: 91.78 mL (ref 56.0–104.0)
LV EDV Index A2C: 54.1 mL/m2
LV EDV Index A4C: 46 mL/m2
LV EDV Index BP: 49.8 mL/m2
LV ESV A2C: 27.9 mL
LV ESV A4C: 27.43 mL
LV ESV BP: 29.26 mL (ref 19.0–49.0)
LV ESV Index A2C: 15.2 mL/m2
LV ESV Index A4C: 14.9 mL/m2
LV ESV Index BP: 15.9 mL/m2
LV Ejection Fraction A2C: 72 percent
LV Ejection Fraction A4C: 68 percent
LV Mass 2D Index: 76.2 g/m2 (ref 43–95)
LV Mass 2D: 140.3 g (ref 67–162)
LVIDd: 4.47 cm (ref 3.90–5.30)
LVIDs: 3.1 cm
LVOT Diameter: 2.17 cm
LVOT Peak Gradient: 3.63 mmHg
LVOT Peak Velocity: 95.27 cm/s
LVPWd: 0.84 cm (ref 0.60–0.90)
MV A Velocity: 91.04 cm/s
MV Area by PHT: 3.4 cm2
MV E Velocity: 79.01 cm/s
MV E Wave Deceleration Time: 223.14 ms
MV E/A: 0.87
MV PHT: 64.71 ms
PR Max Velocity: 74.85 cm/s
PV Systolic Peak Instantaneous Gradient: 2.24 mmHg

## 2019-12-06 LAB — GLUCOSE, POC
Glucose (POC): 90 mg/dL (ref 65–100)
Glucose (POC): 118 mg/dL — ABNORMAL HIGH (ref 65–100)
Glucose (POC): 242 mg/dL — ABNORMAL HIGH (ref 65–100)
Glucose (POC): 142 mg/dL — ABNORMAL HIGH (ref 65–100)
Glucose (POC): 139 mg/dL — ABNORMAL HIGH (ref 65–100)

## 2019-12-06 LAB — RESPIRATORY VIRUS PANEL W/COVID-19, PCR
Adenovirus: NOT DETECTED
Adenovirus: NOT DETECTED
B. parapertussis, PCR: NOT DETECTED
BORDETELLA PARAPERTUSSIS PCR, BORPAR: NOT DETECTED
Bordetella pertussis - PCR: NOT DETECTED
Bordetella pertussis by PCR: NOT DETECTED
Chlamydophila pneumoniae DNA, QL, PCR: NOT DETECTED
Chlamydophilia pneumoniae by PCR: NOT DETECTED
Coronavirus 229E: NOT DETECTED
Coronavirus 299E: NOT DETECTED
Coronavirus CVNL63: NOT DETECTED
Coronavirus HKU1: NOT DETECTED
Coronavirus HKU1: NOT DETECTED
Coronavirus NL63: NOT DETECTED
Coronavirus OC43: NOT DETECTED
Coronavirus OC43: NOT DETECTED
H1N1 by PCR: NOT DETECTED
INFLUENZA A H1: NOT DETECTED
INFLUENZA A H1N1 PCR: NOT DETECTED
Influenza A H3: NOT DETECTED
Influenza A, subtype H1: NOT DETECTED
Influenza A, subtype H3: NOT DETECTED
Influenza A: NOT DETECTED
Influenza A: NOT DETECTED
Influenza B: NOT DETECTED
Influenza B: NOT DETECTED
Metapneumovirus: NOT DETECTED
Metapneumovirus: NOT DETECTED
Mycoplasma pneumoniae DNA, QL, PCR: NOT DETECTED
Mycoplasma pneumoniae by PCR: NOT DETECTED
Parainfluenza 1: NOT DETECTED
Parainfluenza 1: NOT DETECTED
Parainfluenza 2: NOT DETECTED
Parainfluenza 2: NOT DETECTED
Parainfluenza 3: NOT DETECTED
Parainfluenza 3: NOT DETECTED
Parainfluenza virus 4: NOT DETECTED
Parainfluenza: NOT DETECTED
RSV by PCR: NOT DETECTED
RSV by PCR: NOT DETECTED
Rhinovirus Enterovirus PCR: NOT DETECTED
Rhinovirus and Enterovirus: NOT DETECTED
SARS Coronavirus-2: NOT DETECTED
SARS-CoV-2, PCR: NOT DETECTED

## 2019-12-06 LAB — CBC WITH AUTO DIFFERENTIAL
Basophils %: 1 % (ref 0–1)
Basophils Absolute: 0.1 10*3/uL (ref 0.0–0.1)
Eosinophils %: 1 % (ref 0–7)
Eosinophils Absolute: 0.1 10*3/uL (ref 0.0–0.4)
Granulocyte Absolute Count: 0.1 10*3/uL — ABNORMAL HIGH (ref 0.00–0.04)
Hematocrit: 28.7 % — ABNORMAL LOW (ref 35.0–47.0)
Hemoglobin: 9.6 g/dL — ABNORMAL LOW (ref 11.5–16.0)
Immature Granulocytes %: 1 % — ABNORMAL HIGH (ref 0.0–0.5)
Lymphocytes %: 5 % — ABNORMAL LOW (ref 12–49)
Lymphocytes Absolute: 0.6 10*3/uL — ABNORMAL LOW (ref 0.8–3.5)
MCH: 31.5 PG (ref 26.0–34.0)
MCHC: 33.4 g/dL (ref 30.0–36.5)
MCV: 94.1 FL (ref 80.0–99.0)
MPV: 9.8 FL (ref 8.9–12.9)
Monocytes %: 5 % (ref 5–13)
Monocytes Absolute: 0.6 10*3/uL (ref 0.0–1.0)
NRBC Absolute: 0 10*3/uL (ref 0.00–0.01)
Neutrophils %: 87 % — ABNORMAL HIGH (ref 32–75)
Neutrophils Absolute: 9.5 10*3/uL — ABNORMAL HIGH (ref 1.8–8.0)
Nucleated RBCs: 0 PER 100 WBC
Platelets: 401 10*3/uL — ABNORMAL HIGH (ref 150–400)
RBC: 3.05 M/uL — ABNORMAL LOW (ref 3.80–5.20)
RDW: 13.1 % (ref 11.5–14.5)
WBC: 11 10*3/uL (ref 3.6–11.0)

## 2019-12-06 LAB — ECHO (TTE) COMPLETE (PRN CONTRAST/BUBBLE/STRAIN/3D)
AV Area by Peak Velocity: 2.66 cm2
AV Peak Gradient: 7.04 mm[Hg]
AV Peak Velocity: 132.7 cm/s
AVA/BSA Peak Velocity: 1.4 cm2/m2
Aortic Root: 3.11 cm
E/E' Lateral: 9.6
E/E' Ratio (Averaged): 11.85
E/E' Septal: 14.11
EF BP: 68.1 %{total} (ref 55–100)
IVSd: 1.05 cm — AB (ref 0.6–0.9)
LA Area 4C: 14.79 cm2
LA Major Axis: 3.55 cm
LA Minor Axis: 1.93 cm
LA Volume A-L A4C: 38.36 mL (ref 22–52)
LA Volume A-L A4C: 54.11 mL — AB (ref 22–52)
LA Volume BP: 49.14 mL (ref 22–52)
LA Volume Index A-L A2C: 29.38 mL/m2 (ref 16–28)
LA Volume Index A-L A4C: 20.83 mL/m2 (ref 16–28)
LA Volume Index BP: 26.69 mL/m2 (ref 16–28)
LV E' Lateral Velocity: 8.23 cm/s
LV E' Septal Velocity: 5.6 cm/s
LV EDV A2C: 99.61 mL
LV EDV A4C: 84.71 mL
LV EDV BP: 91.78 mL (ref 56–104)
LV EDV Index A2C: 54.1 mL/m2
LV EDV Index A4C: 46 mL/m2
LV EDV Index BP: 49.8 mL/m2
LV ESV A2C: 27.9 mL
LV ESV A4C: 27.43 mL
LV ESV BP: 29.26 mL (ref 19–49)
LV ESV Index A2C: 15.2 mL/m2
LV ESV Index A4C: 14.9 mL/m2
LV ESV Index BP: 15.9 mL/m2
LV Ejection Fraction A2C: 72 %{total}
LV Ejection Fraction A4C: 68 %{total}
LV Mass 2D Index: 76.2 g/m2 (ref 43–95)
LV Mass 2D: 140.3 g (ref 67–162)
LVIDd: 4.47 cm (ref 3.9–5.3)
LVIDs: 3.1 cm
LVOT Diameter: 2.17 cm
LVOT Peak Gradient: 3.63 mm[Hg]
LVOT Peak Velocity: 95.27 cm/s
LVPWd: 0.84 cm (ref 0.6–0.9)
Left Ventricular Ejection Fraction: 58
MV A Velocity: 91.04 cm/s
MV Area by PHT: 3.4 cm2
MV E Velocity: 79.01 cm/s
MV E Wave Deceleration Time: 223.14 ms
MV E/A: 0.87
MV PHT: 64.71 ms
PR Max Velocity: 74.85 cm/s
PV Systolic Peak Instantaneous Gradient: 2.24 mm[Hg]

## 2019-12-06 LAB — POCT GLUCOSE
POC Glucose: 90 mg/dL (ref 65–100)
POC Glucose: 118 mg/dL — ABNORMAL HIGH (ref 65–100)
POC Glucose: 242 mg/dL — ABNORMAL HIGH (ref 65–100)
POC Glucose: 142 mg/dL — ABNORMAL HIGH (ref 65–100)
POC Glucose: 139 mg/dL — ABNORMAL HIGH (ref 65–100)

## 2019-12-06 LAB — BASIC METABOLIC PANEL
Anion Gap: 13 mmol/L (ref 5–15)
BUN/Creatinine Ratio: 18 (ref 12–20)
BUN: 16 MG/DL (ref 6–20)
CO2: 19 mmol/L — ABNORMAL LOW (ref 21–32)
Calcium: 9.8 MG/DL (ref 8.5–10.1)
Chloride: 101 mmol/L (ref 97–108)
Creatinine: 0.89 MG/DL (ref 0.55–1.02)
GFR African American: >60 mL/min/{1.73_m2} (ref >60)
Glucose: 214 mg/dL — ABNORMAL HIGH (ref 65–100)
Potassium: 4.1 mmol/L (ref 3.5–5.1)
Sodium: 133 mmol/L — ABNORMAL LOW (ref 136–145)
eGFR NON-AA: >60 mL/min/{1.73_m2} (ref >60)

## 2019-12-06 MED ORDER — OXYCODONE 5 MG TAB
5 mg | ORAL | Status: DC | PRN
Start: 2019-12-06 — End: 2019-12-12
  Administered 2019-12-06 – 2019-12-10 (×14): via ORAL

## 2019-12-06 MED ORDER — LACTATED RINGERS IV
INTRAVENOUS | Status: DC
Start: 2019-12-06 — End: 2019-12-05
  Administered 2019-12-06: 01:00:00 via INTRAVENOUS

## 2019-12-06 MED ORDER — SODIUM CHLORIDE 0.9 % IV
INTRAVENOUS | Status: DC
Start: 2019-12-06 — End: 2019-12-05
  Administered 2019-12-06: 01:00:00 via INTRAVENOUS

## 2019-12-06 MED ORDER — MIDAZOLAM 1 MG/ML IJ SOLN
1 mg/mL | INTRAMUSCULAR | Status: DC | PRN
Start: 2019-12-06 — End: 2019-12-05

## 2019-12-06 MED ORDER — ONDANSETRON (PF) 4 MG/2 ML INJECTION
4 mg/2 mL | INTRAMUSCULAR | Status: DC | PRN
Start: 2019-12-06 — End: 2019-12-05

## 2019-12-06 MED ORDER — WATER FOR INJECTION, STERILE INJECTION
2 mg | INTRAMUSCULAR | Status: DC | PRN
Start: 2019-12-06 — End: 2019-12-18
  Administered 2019-12-06: 18:00:00

## 2019-12-06 MED ORDER — MEPERIDINE (PF) 25 MG/ML INJ SOLUTION
25 mg/ml | INTRAMUSCULAR | Status: DC | PRN
Start: 2019-12-06 — End: 2019-12-05

## 2019-12-06 MED ORDER — SODIUM CHLORIDE 0.9 % IV
INTRAVENOUS | Status: DC
Start: 2019-12-06 — End: 2019-12-05
  Administered 2019-12-06: 02:00:00 via INTRAVENOUS

## 2019-12-06 MED ORDER — VALSARTAN-HYDROCHLOROTHIAZIDE 160 MG-25 MG TAB
160-25 mg | ORAL_TABLET | ORAL | 0 refills | Status: DC
Start: 2019-12-06 — End: 2019-12-18

## 2019-12-06 MED ORDER — HYDROMORPHONE (PF) 1 MG/ML IJ SOLN
1 mg/mL | INTRAMUSCULAR | Status: DC | PRN
Start: 2019-12-06 — End: 2019-12-05

## 2019-12-06 MED ORDER — FENTANYL CITRATE (PF) 50 MCG/ML IJ SOLN
50 mcg/mL | INTRAMUSCULAR | Status: AC | PRN
Start: 2019-12-06 — End: 2019-12-05
  Administered 2019-12-06 (×3): via INTRAVENOUS

## 2019-12-06 MED ORDER — HYDROMORPHONE (PF) 1 MG/ML IJ SOLN
1 mg/mL | Freq: Four times a day (QID) | INTRAMUSCULAR | Status: DC | PRN
Start: 2019-12-06 — End: 2019-12-06
  Administered 2019-12-06: 05:00:00 via INTRAVENOUS

## 2019-12-06 MED ORDER — MORPHINE 2 MG/ML INJECTION
2 mg/mL | INTRAMUSCULAR | Status: DC | PRN
Start: 2019-12-06 — End: 2019-12-05

## 2019-12-06 MED ORDER — SODIUM CHLORIDE 0.9 % IJ SYRG
INTRAMUSCULAR | Status: DC | PRN
Start: 2019-12-06 — End: 2019-12-05

## 2019-12-06 MED ORDER — PHARMACY VANCOMYCIN NOTE
Freq: Once | Status: AC
Start: 2019-12-06 — End: 2019-12-07
  Administered 2019-12-07: 13:00:00

## 2019-12-06 MED ORDER — HYDROCODONE-ACETAMINOPHEN 5 MG-325 MG TAB
5-325 mg | ORAL | Status: DC | PRN
Start: 2019-12-06 — End: 2019-12-05

## 2019-12-06 MED ORDER — SODIUM CHLORIDE 0.9 % IJ SYRG
Freq: Three times a day (TID) | INTRAMUSCULAR | Status: DC
Start: 2019-12-06 — End: 2019-12-05
  Administered 2019-12-06: 02:00:00 via INTRAVENOUS

## 2019-12-06 MED ORDER — HYDROMORPHONE (PF) 1 MG/ML IJ SOLN
1 mg/mL | INTRAMUSCULAR | Status: DC | PRN
Start: 2019-12-06 — End: 2019-12-12
  Administered 2019-12-06 – 2019-12-08 (×5): via INTRAVENOUS

## 2019-12-06 MED ORDER — METFORMIN SR 500 MG 24 HR TABLET
500 mg | ORAL_TABLET | ORAL | 0 refills | Status: DC
Start: 2019-12-06 — End: 2020-07-20

## 2019-12-06 MED ORDER — FENTANYL CITRATE (PF) 50 MCG/ML IJ SOLN
50 mcg/mL | INTRAMUSCULAR | Status: AC
Start: 2019-12-06 — End: 2019-12-05
  Administered 2019-12-06: 01:00:00 via INTRAVENOUS

## 2019-12-06 MED ORDER — DIPHENHYDRAMINE HCL 50 MG/ML IJ SOLN
50 mg/mL | INTRAMUSCULAR | Status: DC | PRN
Start: 2019-12-06 — End: 2019-12-05

## 2019-12-06 MED FILL — OXYCODONE 5 MG TAB: 5 mg | ORAL | Qty: 1

## 2019-12-06 MED FILL — OXYCODONE 5 MG TAB: 5 mg | ORAL | Qty: 2

## 2019-12-06 MED FILL — CEFEPIME 2 GRAM SOLUTION FOR INJECTION: 2 gram | INTRAMUSCULAR | Qty: 2

## 2019-12-06 MED FILL — ACETAMINOPHEN 325 MG TABLET: 325 mg | ORAL | Qty: 2

## 2019-12-06 MED FILL — LOSARTAN 50 MG TAB: 50 mg | ORAL | Qty: 2

## 2019-12-06 MED FILL — DOK 100 MG CAPSULE: 100 mg | ORAL | Qty: 1

## 2019-12-06 MED FILL — NORMAL SALINE FLUSH 0.9 % INJECTION SYRINGE: INTRAMUSCULAR | Qty: 40

## 2019-12-06 MED FILL — PANTOPRAZOLE 40 MG TAB, DELAYED RELEASE: 40 mg | ORAL | Qty: 1

## 2019-12-06 MED FILL — ASPIRIN 81 MG TAB, DELAYED RELEASE: 81 mg | ORAL | Qty: 1

## 2019-12-06 MED FILL — METRO I.V. 500 MG/100 ML INTRAVENOUS PIGGYBACK: 500 mg/100 mL | INTRAVENOUS | Qty: 100

## 2019-12-06 MED FILL — SODIUM CHLORIDE 0.9 % IV: INTRAVENOUS | Qty: 1000

## 2019-12-06 MED FILL — VANCOMYCIN IN 0.9 % SODIUM CHLORIDE 1.25 GRAM/250 ML IV: 1.25 gram/250 mL | INTRAVENOUS | Qty: 250

## 2019-12-06 MED FILL — HYDROMORPHONE (PF) 1 MG/ML IJ SOLN: 1 mg/mL | INTRAMUSCULAR | Qty: 1

## 2019-12-06 MED FILL — HYDROCHLOROTHIAZIDE 25 MG TAB: 25 mg | ORAL | Qty: 1

## 2019-12-06 MED FILL — GABAPENTIN 300 MG CAP: 300 mg | ORAL | Qty: 1

## 2019-12-06 MED FILL — INSULIN LISPRO 100 UNIT/ML INJECTION: 100 unit/mL | SUBCUTANEOUS | Qty: 1

## 2019-12-06 MED FILL — SENNA LAX 8.6 MG TABLET: 8.6 mg | ORAL | Qty: 1

## 2019-12-06 MED FILL — PHARMACY VANCOMYCIN NOTE: Qty: 1

## 2019-12-06 MED FILL — FENTANYL CITRATE (PF) 50 MCG/ML IJ SOLN: 50 mcg/mL | INTRAMUSCULAR | Qty: 2

## 2019-12-06 MED FILL — AMLODIPINE 5 MG TAB: 5 mg | ORAL | Qty: 2

## 2019-12-06 MED FILL — CATHFLO ACTIVASE 2 MG INTRA-CATHETER SOLUTION: 2 mg | Qty: 1

## 2019-12-06 MED FILL — GABAPENTIN 100 MG CAP: 100 mg | ORAL | Qty: 1

## 2019-12-06 MED FILL — LACTATED RINGERS IV: INTRAVENOUS | Qty: 1000

## 2019-12-06 MED FILL — INSULIN GLARGINE 100 UNIT/ML INJECTION: 100 unit/mL | SUBCUTANEOUS | Qty: 1

## 2019-12-06 NOTE — Progress Notes (Signed)
[facility]                                                                                          Hospitalist Progress Note  [name], MD  Answering service: [phone] OR [phone] from in house phone        Date of Service:  12/06/2019  NAME:  Lindsey Huynh  DOB:  03/17/1951  MRN:  [id number]      Admission Summary:   From H&P 11/23/2019:  "Lindsey Huynh??is a 69 y.o.??female??with past medical history of diabetes, insulin-dependent, GERD and hypertension comes for return of left foot pain and swelling.    Interval history / Subjective:     F/U for left foot cellulitis, left great toe gangrene, DMII, HTN, GERD, Hypokalemia, left popliteal occlusion s/p left fem-pop bypass  S/P TMA yesterday has pain ++     Assessment & Plan:     Left great toe Osteomyelitis   - MRI LLE: 1. First distal phalanx osteomyelitis.  2. No abscess.  - IV antibiotics  - Vascular surgery following- Patient to follow up in 2 weeks post- op for staple removal.   - Consult ID for antibiotic recommendations   - Podiatry following -TMA POD 1  ??  LLE pain:??dry gangrene??of??left 2nd toe cellulitic changes/Left popliteal occlusion  -s/p left fem-pop bypass (03/11)  -CT LLE (03/06) NL  -Elevated Sed rate and CRP  -BC (03/06) NGTD   -Pain control  - MRI - 1. First distal phalanx osteomyelitis.. No abscess.s/p amputation  ??  Anemia   Stable, No sign of bleeding noted.   - hgb 7.6 today - Monitor labs and transfuse if hgb <7.0   ??  DMII:??hold metformin while IP  -Cont basal insulin, SSI  -Diabetic diet  -a1c 8.1  - DM following??  ??  HTN  -Controlled  -Cont home meds   ??  GERD  -Cont PPI  ??  Hypokalemia  -potassium 3.2  - Replaced     Code status: Full   DVT prophylaxis: Lovenox     Care Plan discussed with: Patient/Family, Nurse and Case Manager  Anticipated Disposition: SNF/LTC  Anticipated Discharge: 24 hours to 48 hoursmanor care soon awaiting path     Hospital Problems  Date Reviewed: 06-22-2018           Codes Class Noted POA    Gangrene ([place]) ICD-10-CM: [id number]  ICD-9-CM: 785.4  11/23/2019 Unknown                Review of Systems:   A comprehensive review of systems was negative except for that written in the HPI.       Vital Signs:    Last 24hrs VS reviewed since prior progress note. Most recent are:  Visit Vitals  BP (!) 170/73 (BP 1 Location: Left upper arm, BP Patient Position: At rest)   Pulse 85   Temp 98.4 ??F (36.9 ??C)   Resp 16   Ht 5\' 5"  (1.651 m)   Wt 76.7 kg (169 lb)   SpO2 100%   BMI 28.12 kg/m??  Intake/Output Summary (Last 24 hours) at 12/06/2019 1124  Last data filed at 12/05/2019 2122  Gross per 24 hour   Intake 550 ml   Output 10 ml   Net 540 ml        Physical Examination:             Constitutional:  No acute distress, cooperative   ENT:  Oral mucosa moist, oropharynx benign.    Resp:  CTA bilaterally.   CV:  Regular rhythm, normal rate,     GI:  Soft, non distended, non tender.bs+    Musculoskeletal:  LLE dressing c/d  staples intact    Neurologic:  Moves all extremities.  AAOx3, CN II-XII reviewed, follows commands     Psych:  Good insight, Not anxious nor agitated.     ??            Data Review:    Review and/or order of clinical lab test  Review and/or order of tests in the medicine section of CPT      Labs:     Recent Labs     12/06/19  0620 12/05/19  0529   WBC 11.0 5.4   HGB 9.6* 7.5*   HCT 28.7* 22.1*   PLT 401* 299     Recent Labs     12/06/19  0620 12/05/19  0529 12/04/19  0457   NA 133* 136 138   K 4.1 3.2* 3.2*   CL 101 104 102   CO2 19* 27 25   BUN 16 14 13    CREA 0.89 0.77 0.87   GLU 214* 77 70   CA 9.8 9.2 9.1     No results for input(s): ALT, AP, TBIL, TBILI, TP, ALB, GLOB, GGT, AML, LPSE in the last 72 hours.    No lab exists for component: SGOT, GPT, AMYP, HLPSE  No results for input(s): INR, PTP, APTT, INREXT, INREXT in the last 72 hours.   No results for input(s): FE, TIBC, PSAT, FERR in the last 72 hours.   No results found for: FOL, RBCF   No results for input(s): PH, PCO2, PO2  in the last 72 hours.  No results for input(s): CPK, CKNDX, TROIQ in the last 72 hours.    No lab exists for component: CPKMB  Lab Results   Component Value Date/Time    Cholesterol, total 219 (H) 10/30/2018 10:27 AM    HDL Cholesterol 63 10/30/2018 10:27 AM    LDL, calculated 130 (H) 10/30/2018 10:27 AM    Triglyceride 131 10/30/2018 10:27 AM    CHOL/HDL Ratio 3.4 12/26/2007 09:12 AM     Lab Results   Component Value Date/Time    Glucose (POC) 242 (H) 12/06/2019 06:12 AM    Glucose (POC) 118 (H) 12/05/2019 10:33 PM    Glucose (POC) 90 12/05/2019 09:08 PM    Glucose (POC) 80 12/05/2019 04:22 PM    Glucose (POC) 90 12/05/2019 12:20 PM     No results found for: COLOR, APPRN, SPGRU, REFSG, PHU, PROTU, GLUCU, KETU, BILU, UROU, NITU, LEUKU, GLUKE, EPSU, BACTU, WBCU, RBCU, CASTS, UCRY      Medications Reviewed:     Current Facility-Administered Medications   Medication Dose Route Frequency   ??? HYDROmorphone (PF) (DILAUDID) injection 0.5 mg  0.5 mg IntraVENous Q4H PRN   ??? alteplase (CATHFLO) 1 mg in sterile water (preservative free) 1 mL injection  1 mg InterCATHeter PRN   ??? [START ON 12/07/2019] Vanc trough due 3/20 @ 09:00  Other ONCE   ??? oxyCODONE IR (ROXICODONE) tablet 10 mg  10 mg Oral Q4H PRN   ??? cefepime (MAXIPIME) 2 g in 0.9% sodium chloride (MBP/ADV) 100 mL MBP  2 g IntraVENous Q8H   ??? insulin glargine (LANTUS) injection 12 Units  12 Units SubCUTAneous QHS   ??? vancomycin (VANCOCIN) 1250 mg in NS 250 ml infusion  1,250 mg IntraVENous Q16H   ??? acetaminophen (TYLENOL) tablet 650 mg  650 mg Oral Q6H   ??? gabapentin (NEURONTIN) capsule 300 mg  300 mg Oral QHS   ??? gabapentin (NEURONTIN) capsule 100 mg  100 mg Oral DAILY   ??? Vancomycin- Pharmacy to Dose   Other Rx Dosing/Monitoring   ??? metroNIDAZOLE (FLAGYL) IVPB premix 500 mg  500 mg IntraVENous Q12H   ??? sodium chloride (NS) flush 5-40 mL  5-40 mL IntraVENous Q8H   ??? sodium chloride (NS) flush 5-40 mL  5-40 mL IntraVENous PRN   ??? sodium phosphate (FLEET'S) enema 1 Enema   1 Enema Rectal PRN   ??? oxyCODONE IR (ROXICODONE) tablet 5 mg  5 mg Oral Q4H PRN   ??? losartan (COZAAR) tablet 100 mg  100 mg Oral DAILY   ??? docusate sodium (COLACE) capsule 100 mg  100 mg Oral BID   ??? senna (SENOKOT) tablet 8.6 mg  1 Tab Oral DAILY   ??? polyethylene glycol (MIRALAX) packet 17 g  17 g Oral DAILY PRN   ??? bisacodyL (DULCOLAX) suppository 10 mg  10 mg Rectal DAILY PRN   ??? hydroCHLOROthiazide (HYDRODIURIL) tablet 25 mg  25 mg Oral DAILY   ??? sodium chloride (NS) flush 5-40 mL  5-40 mL IntraVENous Q8H   ??? sodium chloride (NS) flush 5-40 mL  5-40 mL IntraVENous PRN   ??? acetaminophen (TYLENOL) suppository 650 mg  650 mg Rectal Q6H PRN   ??? promethazine (PHENERGAN) tablet 12.5 mg  12.5 mg Oral Q6H PRN    Or   ??? ondansetron (ZOFRAN) injection 4 mg  4 mg IntraVENous Q6H PRN   ??? glucose chewable tablet 16 g  4 Tab Oral PRN   ??? dextrose (D50W) injection syrg 12.5-25 g  25-50 mL IntraVENous PRN   ??? glucagon (GLUCAGEN) injection 1 mg  1 mg IntraMUSCular PRN   ??? insulin lispro (HUMALOG) injection   SubCUTAneous AC&HS   ??? aspirin delayed-release tablet 81 mg  81 mg Oral DAILY   ??? pantoprazole (PROTONIX) tablet 40 mg  40 mg Oral ACB   ??? amLODIPine (NORVASC) tablet 10 mg  10 mg Oral DAILY   ??? [Held by provider] enoxaparin (LOVENOX) injection 40 mg  40 mg SubCUTAneous Q24H     ______________________________________________________________________  EXPECTED LENGTH OF STAY: 4d 2h  ACTUAL LENGTH OF STAY:          13                 [name], MD

## 2019-12-06 NOTE — Progress Notes (Addendum)
PHYSICAL THERAPY  Orders acknowledged and chart reviewed in prep for PT re-evaluation. Interviewed nurse and patient. Patient is now NWB on LLE post-L transmetatarsal amputation. Will follow up tomorrow for reassessment as patient is in too much pain and it is not yet well-managed. Discharge plan is to go to [facility] for rehab next week later in the week.  Riccardo Dubin

## 2019-12-06 NOTE — Other (Signed)
[place]    CLINICAL NURSE SPECIALIST CONSULT     FOLLOW UP  NOTE  Initial Presentation   Lindsey Huynh is a 69 y.o. female who presented to the ED 11/23/19 with left foot/great toe pain. Given concerns for dry gangrene and cellulitis, she was started on IV and admitted under hospitalist service  HX:   Past Medical History:   Diagnosis Date   ??? Diabetes ([place])     insulin + metformin   ??? GERD (gastroesophageal reflux disease)    ??? Hypercholesterolemia    ??? Hypertension         DX: Left great toe osteomyelitis    TX: Vascular surgery/podiatry consulted, Left fem-pop bypass, amputation of left great toe, IV antibiotics    Hospital course   Clinical progress has been complicated by venous insufficiency    S/p Fem-Pop bypass on 3/11 and recovering well-Dr. Timmothy Sours following  Podiatry following for left 1st and 2nd toe diabetic ulcers with necrotic eschar present. NO surgical intervention chosen at this time-treating conservatively.      12/05/19: Patient elected for amputation of left 1st and 2nd toe-cardiology cleared  Diabetes    Patient has known Type 2 diabetes, treated with Levemir and Metformin PTA.     Admission BG 170 and A1c 8.1% indicate poor diabetes control.   Previous A1C 03/2019 11.0% . Highest A1C Feb. 2020-12.1%.     Ambulatory blood glucose management provided by primary care provider-Last seen 11/05/2019 virtual visit    Consulted by Provider for advanced diabetes nursing assessment and care, specifically related to   [x]  Home management assessment    Diabetes-related medical history  Acute complications  NONE  Neurological complications  Peripheral neuropathy  Microvascular disease  NONE  Macrovascular disease  Peripheral vascular disease and Foot wounds  Other associated conditions     HTN/hypercholesteremia    Diabetes medication history  Drug class Currently in use Discontinued Never used   Biguanide Metformin 1000mg  daily in divided doses     DDP-4 inhibitor        Sulfonylurea      Thiazolidinedione      GLP-1 RA      SGLT-2 inhibitors      Basal insulin Levemir 20units daily     Bolus insulin      Fixed Dose  Combinations        Subjective   S/p left transmetatarsal amputation with percutaneous achilles tendon lengthening 3/18  Cardiology signed off as stable post-op  IV antibiotics  Pain control  Lantus 12 units daily  3 units correctional insulin in the last 24 hrs   Decadron 4mg  in OR at 1929  Fasting glucose 242 (but 70 and 77 on labs 2 days prior)  Glucose yesterday while NPO: 80-118  Objective   Physical exam  General Alert, oriented and in no acute distress. Conversant and cooperative.   Vital Signs   Visit Vitals  BP (!) 170/73 (BP 1 Location: Left upper arm, BP Patient Position: At rest)   Pulse 85   Temp 98.4 ??F (36.9 ??C)   Resp 16   Ht 5\' 5"  (1.651 m)   Wt 76.7 kg (169 lb)   SpO2 100%   BMI 28.12 kg/m??     Skin  Warm and dry.    Heart   Regular rate and rhythm. No murmurs, rubs or gallops  Lungs  Clear to auscultation without rales or rhonchi  Extremities S/p left great toe amputation  Laboratory  BMP:   Lab Results   Component Value Date/Time    NA 133 (L) 12/06/2019 06:20 AM    K 4.1 12/06/2019 06:20 AM    CL 101 12/06/2019 06:20 AM    CO2 19 (L) 12/06/2019 06:20 AM    AGAP 13 12/06/2019 06:20 AM    GLU 214 (H) 12/06/2019 06:20 AM    BUN 16 12/06/2019 06:20 AM    CREA 0.89 12/06/2019 06:20 AM    GFRAA >60 12/06/2019 06:20 AM    GFRNA >60 12/06/2019 06:20 AM      Factors impacting BG management  Factor Dose Comments   Nutrition:  Carb-controlled meals     60 grams/meal      Pain Left foot    Infection IV abx (vanc/ cefepime)    Other: venous insuffiencey  Vascular surgery following S/p fem pop 11/28/19     Blood glucose pattern        Assessment and Plan   Nursing Diagnosis Risk for unstable blood glucose pattern   Nursing Intervention Domain 5250 Decision-making Support   Nursing Interventions Examined current inpatient diabetes control   Explored factors  facilitating and impeding inpatient management  Identified self-management practices impeding diabetes control  Explored corrective strategies with patient and responsible inpatient provider   Informed patient of rational for insulin strategy while hospitalized    Instructed patient in importance of checking blood glucose DAILY before meals - discussed when she should check her BG after meals-2hours     Evaluation   [name], with uncontrolled Type 2 diabetes, admitted in the setting of diabetic foot ulcer with osteomyelitis and venus insufficiency now s/p fem/pop bypass and amputation of the left great toe.  Prior to admission, she did not achieve diabetes control, as evidenced by admission BG of 170 and A1c of 81%.  While her A1C is improved since July 2020 when it was >11%, her chronic elevated glucose is a direct contributor to her foot wounds.  During the inpatient admission, she had excellent glucose response to low dose basal and correctional insulin.  She did receive a one time dose of decadron in the OR yesterday impacting her fasting glucose this am (242).  Decadron will elevate glucose for about 24 hrs (until 9pm tonight).  She will need to continue optimal glucose control for wound heeling.  Inpatient glucose goal 100-180.    Recommendations     [x]  Use of Subcutaneous Insulin Order set [phone])  Insulin Dosing Specific recommendation   CONTINUE   Basal                               (Based on weight, BMI & GFR) 12 units daily   Please reduce to 10 units once daily if   fasting glucose under 100   CONTINUE Corrective                     (Useful in adjusting insulin dosing) [x]  Normal sensitivity ACHS          Discharge Planning   1.   Discharge on hospital doses of insulin.  2. Re-start Metformin at PTA doses    Billing Code(s)   [x]  99231 IP subsequent hospital care - 15 minutes    Before making these care recommendations, I personally reviewed the hospitalization record, including notes, laboratory  & diagnostic data and current medications, and   examined the patient at the  bedside (circumstances permitting) before making care recommendations.     Total minutes: 2    Mittie Bodo, CNS  Diabetes Clinical Nurse Specialist  [facility]  Access via [employer]

## 2019-12-06 NOTE — Other (Addendum)
66: Paged Dr. Duwaine Maxin office in regards to severe pain; 10 out of 10.     0440: Dr. Posey Pronto notified. Verbal order to modify Diluadid 0.5 mg IV order to every 4 hours.

## 2019-12-06 NOTE — Progress Notes (Signed)
[facility]               Division of Cardiology  [phone]                       Progress note              Madolyn Frieze M.D., F.A.C.C.                                      NAME:  Lindsey Huynh   DOB:   07/02/1951   MRN:   [id number]         ICD-10-CM ICD-9-CM    1. Dry gangrene (HCC)  I96 785.4    2. Cellulitis of left lower extremity  L03.116 682.6    3. Hypokalemia  E87.6 276.8    4. Type 2 diabetes mellitus with diabetic peripheral angiopathy and gangrene, with long-term current use of insulin (HCC)  E11.52 250.70     Z79.4 443.81      785.4      V58.67    5. Preoperative clearance  Z01.818 V72.84    6. Gangrene ([place])  I96 785.4                  HPI   very pleasant 69 yo female with h/o HTN, DM and PAD   She is now post op day #7 from left leg bypass and doing well and s/p transmetatarsal left foot  amputation postop day#1    She is doing well cardiac wise.  She denies any chest pain or shortness of breath.  Her only complaint is discomfort and pain at the site of transmetatarsal amputation of her left foot.      Assessment/Plan: 1.  Postop evaluation.  Patient seen preop.  She is doing well from the cardiac standpoint after transmetatarsal amputation of the left foot.  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  She has had no chest pain in or shortness of breath.    Echocardiogram done still pending.  No additional cardiac interventions at this point.  We will follow up on the echocardiogram and unless abnormalities are noted no additional cardiac intervention will be needed for now.    Would recommend outpatient ischemic evaluation given her risk factors and the presence of peripheral arterial disease as well.    2.  PAD: Status post femoropopliteal bypass on November 28, 2019 and doing well.  Closely followed vascular surgery.    3.  Hypertension: She remains hypertensive.  We will defer to primary team but will remain available if  needed.  I suspect the pain is also driving up her blood pressure.  Consider addition of labetalol if needed for blood pressure control    I will sign off for now but remain available as needed.  Echo is currently pending.           CARDIAC STUDIES                      @LASTEKG       IMAGING      CT Results (most recent):  Results from Hospital Encounter encounter on 11/23/19   CT LOW EXT LT W CONT    Narrative EXAM: CT LOW EXT LT W CONT    INDICATION:  Pain. Great toe gangrene     COMPARISON: Radiographs 11/23/2019    CONTRAST: 100 mL of Isovue-300.    TECHNIQUE: Helical CT of the left foot during uneventful rapid bolus intravenous  contrast administration. Coronal and Sagittal reformats were generated. Images  reviewed in soft tissue and bone windows. CT dose reduction was achieved through  the use of a standardized protocol tailored for this examination and automatic  exposure control for dose modulation.    FINDINGS: There is soft tissue swelling demonstrated in the forefoot which is  somewhat greater along the medial and dorsal aspect of the distal hallux. In the  remainder of the forefoot, subcutaneous swelling predominates medially dorsally.  There is substantial irregularity of the nail of the hallux and underlying  nailbed though no evidence for soft tissue gas or soft tissue collection. There  is no demonstration of substantial joint effusion nor CT imaging evidence for  osteomyelitis. 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 Bone  mineralization is perhaps mildly diminished.      Impression No CT imaging evidence for soft tissue gas, abscess or osteomyelitis.       XR Results (most recent):  Results from Hospital Encounter encounter on 11/23/19   XR FOOT LT AP/LAT    Narrative EXAM: XR FOOT LT AP/LAT    INDICATION: post op xray s/p transmetatarsal amputation.    COMPARISON: None.    FINDINGS: Two views of the left foot demonstrate  transmetatarsal amputation. No  evidence of immediate complication.      Impression 1. Transmetatarsal amputation.        MRI Results (most recent):          Past Medical History:   Diagnosis Date   ??? Diabetes ([place])     insulin + metformin   ??? GERD (gastroesophageal reflux disease)    ??? Hypercholesterolemia    ??? Hypertension            Past Surgical History:   Procedure Laterality Date   ??? HX COLONOSCOPY     ??? HX ORTHOPAEDIC  06-30-10    back surgery (tumor removed)   ??? 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         Wt Readings from Last 3 Encounters:   12/05/19 169 lb (76.7 kg)   04/12/19 180 lb (81.6 kg)   10/30/18 168 lb 9.6 oz (76.5 kg)         Review of Systems:   Pertinent items are noted in the History of Present Illness.         No intake/output data recorded.    03/17 1901 - 03/19 0700  In: 550 [I.V.:550]  Out: 10       Telemetry: normal sinus rhythm    Physical Exam:    Neck: no JVD  Heart: regular rate and rhythm  Lungs: clear to auscultation bilaterally  Abdomen: soft, non-tender. Bowel sounds normal. No masses,  no organomegaly  Extremities: no edema    Current Facility-Administered Medications   Medication  Dose Route Frequency   ??? HYDROmorphone (PF) (DILAUDID) injection 0.5 mg  0.5 mg IntraVENous Q4H PRN   ??? cefepime (MAXIPIME) 2 g in 0.9% sodium chloride (MBP/ADV) 100 mL MBP  2 g IntraVENous Q8H   ??? insulin glargine (LANTUS) injection 12 Units  12 Units SubCUTAneous QHS   ??? vancomycin (VANCOCIN) 1250 mg in NS 250 ml infusion  1,250 mg IntraVENous Q16H   ??? acetaminophen (TYLENOL) tablet 650 mg  650 mg Oral Q6H   ??? gabapentin (NEURONTIN) capsule 300 mg  300 mg Oral QHS   ??? gabapentin (NEURONTIN) capsule 100 mg  100 mg Oral DAILY   ??? Vancomycin- Pharmacy to Dose   Other Rx  Dosing/Monitoring   ??? metroNIDAZOLE (FLAGYL) IVPB premix 500 mg  500 mg IntraVENous Q12H   ??? sodium chloride (NS) flush 5-40 mL  5-40 mL IntraVENous Q8H   ??? sodium chloride (NS) flush 5-40 mL  5-40 mL IntraVENous PRN   ??? sodium phosphate (FLEET'S) enema 1 Enema  1 Enema Rectal PRN   ??? oxyCODONE IR (ROXICODONE) tablet 5 mg  5 mg Oral Q4H PRN   ??? losartan (COZAAR) tablet 100 mg  100 mg Oral DAILY   ??? docusate sodium (COLACE) capsule 100 mg  100 mg Oral BID   ??? senna (SENOKOT) tablet 8.6 mg  1 Tab Oral DAILY   ??? polyethylene glycol (MIRALAX) packet 17 g  17 g Oral DAILY PRN   ??? bisacodyL (DULCOLAX) suppository 10 mg  10 mg Rectal DAILY PRN   ??? hydroCHLOROthiazide (HYDRODIURIL) tablet 25 mg  25 mg Oral DAILY   ??? sodium chloride (NS) flush 5-40 mL  5-40 mL IntraVENous Q8H   ??? sodium chloride (NS) flush 5-40 mL  5-40 mL IntraVENous PRN   ??? acetaminophen (TYLENOL) suppository 650 mg  650 mg Rectal Q6H PRN   ??? promethazine (PHENERGAN) tablet 12.5 mg  12.5 mg Oral Q6H PRN    Or   ??? ondansetron (ZOFRAN) injection 4 mg  4 mg IntraVENous Q6H PRN   ??? glucose chewable tablet 16 g  4 Tab Oral PRN   ??? dextrose (D50W) injection syrg 12.5-25 g  25-50 mL IntraVENous PRN   ??? glucagon (GLUCAGEN) injection 1 mg  1 mg IntraMUSCular PRN   ??? insulin lispro (HUMALOG) injection   SubCUTAneous AC&HS   ??? aspirin delayed-release tablet 81 mg  81 mg Oral DAILY   ??? pantoprazole (PROTONIX) tablet 40 mg  40 mg Oral ACB   ??? amLODIPine (NORVASC) tablet 10 mg  10 mg Oral DAILY   ??? [Held by provider] enoxaparin (LOVENOX) injection 40 mg  40 mg SubCUTAneous Q24H         All Cardiac Markers in the last 24 hours:  No results found for: CPK, CK, CKMMB, CKMB, RCK3, CKMBT, CKMBPOC, CKNDX, CKND1, MYO, TROPT, TROIQ, TROI, TROPT, TNIPOC, BNP, BNPP, BNPNT     Lab Results   Component Value Date/Time    Creatinine (POC) 0.7 10/07/2010 09:41 AM    Creatinine 0.89 12/06/2019 06:20 AM          No results found for: CPK, RCK1, RCK2, RCK3, RCK4, CKMB, CKNDX, CKND1,  TROPT, TROIQ, BNPP, BNP      Lab Results   Component Value Date/Time    WBC 11.0 12/06/2019 06:20 AM    HGB 9.6 (L) 12/06/2019 06:20 AM    HCT 28.7 (L) 12/06/2019 06:20 AM    PLATELET 401 (H) 12/06/2019 06:20 AM    MCV 94.1 12/06/2019  06:20 AM         Lab Results   Component Value Date/Time    Sodium 133 (L) 12/06/2019 06:20 AM    Potassium 4.1 12/06/2019 06:20 AM    Chloride 101 12/06/2019 06:20 AM    CO2 19 (L) 12/06/2019 06:20 AM    Anion gap 13 12/06/2019 06:20 AM    Glucose 214 (H) 12/06/2019 06:20 AM    BUN 16 12/06/2019 06:20 AM    Creatinine 0.89 12/06/2019 06:20 AM    BUN/Creatinine ratio 18 12/06/2019 06:20 AM    GFR est AA >60 12/06/2019 06:20 AM    GFR est non-AA >60 12/06/2019 06:20 AM    Calcium 9.8 12/06/2019 06:20 AM         No results found for: BNP, BNPP, BNPPPOC, XBNPT, BNPNT      Lab Results   Component Value Date/Time    Cholesterol, total 219 (H) 10/30/2018 10:27 AM    HDL Cholesterol 63 10/30/2018 10:27 AM    LDL, calculated 130 (H) 10/30/2018 10:27 AM    VLDL, calculated 26 10/30/2018 10:27 AM    Triglyceride 131 10/30/2018 10:27 AM    CHOL/HDL Ratio 3.4 12/26/2007 09:12 AM         Lab Results   Component Value Date/Time    ALT (SGPT) 23 11/23/2019 01:32 PM    Alk. phosphatase 150 (H) 11/23/2019 01:32 PM    Bilirubin, total 0.2 11/23/2019 01:32 PM

## 2019-12-06 NOTE — Progress Notes (Signed)
Bedside and Verbal shift change report given to Macy (oncoming nurse) by [name] (offgoing nurse). Report included the following information SBAR, Kardex, Procedure Summary, Intake/Output and MAR.

## 2019-12-06 NOTE — Progress Notes (Signed)
ID Progress Note  12/06/2019       Left tma 3/19    Subjective:       Afebrile. No dyspnea, abdominal pain, headache or sore throat    ROS: No anaphylaxis, seizures, syncope, hematemesis, hematochezia     Objective:     Vitals:   Visit Vitals  BP (!) 170/73 (BP 1 Location: Left upper arm, BP Patient Position: At rest)   Pulse 85   Temp 98.4 ??F (36.9 ??C)   Resp 16   Ht 5' 5"  (1.651 m)   Wt 76.7 kg (169 lb)   SpO2 100%   BMI 28.12 kg/m??        Tmax:  Temp (24hrs), Avg:98.3 ??F (36.8 ??C), Min:97.8 ??F (36.6 ??C), Max:99.3 ??F (37.4 ??C)      Exam:    Not in distress  Pink conjunctivae, anicteric sclerae  No cervical lymphdenopathy   Lung clear, no rales, wheezes or rhonchi   Heart: s1, s2, RRR, no murmurs rubs or clicks  Abdomen: soft nontender, no guarding or rebound  Knees not warm or tender  Left foot bandaged    Labs:   Lab Results   Component Value Date/Time    WBC 11.0 12/06/2019 06:20 AM    HGB 9.6 (L) 12/06/2019 06:20 AM    HCT 28.7 (L) 12/06/2019 06:20 AM    PLATELET 401 (H) 12/06/2019 06:20 AM    MCV 94.1 12/06/2019 06:20 AM     Lab Results   Component Value Date/Time    Sodium 133 (L) 12/06/2019 06:20 AM    Potassium 4.1 12/06/2019 06:20 AM    Chloride 101 12/06/2019 06:20 AM    CO2 19 (L) 12/06/2019 06:20 AM    Anion gap 13 12/06/2019 06:20 AM    Glucose 214 (H) 12/06/2019 06:20 AM    BUN 16 12/06/2019 06:20 AM    Creatinine 0.89 12/06/2019 06:20 AM    BUN/Creatinine ratio 18 12/06/2019 06:20 AM    GFR est AA >60 12/06/2019 06:20 AM    GFR est non-AA >60 12/06/2019 06:20 AM    Calcium 9.8 12/06/2019 06:20 AM    Bilirubin, total 0.2 11/23/2019 01:32 PM    Alk. 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  #2 hyperlipidemia  ??  #3  hypertension  ??  #4 diabetes  ??    Recommendations:       Had TMA yesterday     Continue vanco, cefepime and flagyl     Ff up cultures and path       Blair Hailey, MD

## 2019-12-06 NOTE — Progress Notes (Signed)
Progress Notes by [name], DPM at 12/06/19 [id number]                Author: [name], DPM  Service: Podiatry  Author Type: Physician       Filed: 12/07/19 0655  Date of Service: 12/06/19 0803  Status: Signed          Editor: [name], DPM (Physician)                                                                     Francoise Schaumann, DPM - Despina Pole. 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   Musculoskeletal: no joint / extremity  pain, misalignment, stiffness, decreased ROM, crepitus.   Integument: No pruritis, rashes, lesions, left foot and leg wounds.   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  Neuromuscular Blockers, Steroidal  Other (comments)             GI Upset        History reviewed. No pertinent family  history.    @HXMEDICAL @     Past Surgical History:         Procedure  Laterality  Date          ?  HX COLONOSCOPY         ?  HX ORTHOPAEDIC    06-30-10          back surgery (tumor removed)          ?  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  Smoking status:  Former Smoker              Years:  10.00         ?  Smokeless tobacco:  Never Used       Substance Use Topics         ?  Alcohol use:  Yes              Alcohol/week:  5.0 standard drinks              Types:  6 Cans of beer per week            Social History          Substance and Sexual Activity        Alcohol Use  Yes         ?  Alcohol/week:  5.0 standard drinks         ?  Types:  6 Cans of beer per week          Social History          Substance and Sexual Activity        Drug Use  No           Social History          Tobacco Use        Smoking Status  Former Smoker         ?  Years:  10.00        Smokeless Tobacco  Never Used          Current Facility-Administered Medications          Medication  Dose  Route  Frequency           ?  HYDROmorphone (PF) (DILAUDID) injection 0.5 mg   0.5 mg  IntraVENous  Q4H PRN     ?  potassium chloride SR (KLOR-CON 10) tablet 40 mEq   40 mEq  Oral  BID     ?  cefepime (MAXIPIME) 2 g in 0.9% sodium chloride (MBP/ADV) 100 mL MBP   2 g  IntraVENous  Q8H     ?  insulin glargine (LANTUS) injection 12 Units   12 Units  SubCUTAneous  QHS     ?  vancomycin (VANCOCIN) 1250 mg in NS 250 ml infusion   1,250 mg  IntraVENous  Q16H     ?  acetaminophen (TYLENOL) tablet 650 mg   650 mg  Oral  Q6H      ?  gabapentin (NEURONTIN) capsule 300 mg   300 mg  Oral  QHS     ?  gabapentin (NEURONTIN) capsule 100 mg   100 mg  Oral  DAILY     ?  Vancomycin- Pharmacy to Dose     Other  Rx Dosing/Monitoring     ?  metroNIDAZOLE (FLAGYL) IVPB premix 500 mg   500 mg  IntraVENous  Q12H     ?  sodium chloride (NS) flush 5-40 mL   5-40 mL  IntraVENous  Q8H     ?  sodium chloride (NS) flush 5-40 mL   5-40 mL  IntraVENous  PRN     ?  sodium phosphate (FLEET'S) enema 1 Enema   1 Enema  Rectal  PRN     ?  oxyCODONE IR (ROXICODONE) tablet 5 mg   5 mg  Oral  Q4H PRN     ?  losartan (COZAAR) tablet 100 mg   100 mg  Oral  DAILY     ?  docusate sodium (COLACE) capsule 100 mg   100 mg  Oral  BID     ?  senna (SENOKOT) tablet 8.6 mg   1 Tab  Oral  DAILY     ?  polyethylene glycol (MIRALAX) packet 17 g   17 g  Oral  DAILY PRN     ?  bisacodyL (DULCOLAX) suppository 10 mg   10 mg  Rectal  DAILY PRN     ?  hydroCHLOROthiazide (HYDRODIURIL) tablet 25 mg   25 mg  Oral  DAILY     ?  sodium chloride (NS) flush 5-40 mL   5-40 mL  IntraVENous  Q8H     ?  sodium chloride (NS) flush 5-40 mL   5-40 mL  IntraVENous  PRN     ?  acetaminophen (TYLENOL) suppository 650 mg   650 mg  Rectal  Q6H PRN     ?  promethazine (PHENERGAN) tablet 12.5 mg   12.5 mg  Oral  Q6H PRN          Or           ?  ondansetron (ZOFRAN) injection 4 mg   4 mg  IntraVENous  Q6H PRN           ?  glucose chewable tablet 16 g   4 Tab  Oral  PRN     ?  dextrose (D50W) injection syrg 12.5-25 g   25-50 mL  IntraVENous  PRN     ?  glucagon (GLUCAGEN) injection 1 mg   1 mg  IntraMUSCular  PRN     ?  insulin lispro (HUMALOG) injection     SubCUTAneous  AC&HS     ?  aspirin delayed-release tablet 81 mg   81 mg  Oral  DAILY     ?  pantoprazole (PROTONIX) tablet 40 mg   40 mg  Oral  ACB     ?  amLODIPine (NORVASC) tablet 10 mg   10 mg  Oral  DAILY           ?  [Held by provider] enoxaparin (LOVENOX) injection 40 mg   40 mg  SubCUTAneous  Q24H            Objective:   Vitals:    Patient Vitals  for the past 12 hrs:            BP  Temp  Pulse  Resp  SpO2            12/06/19 0238  (!) 166/82  98 ??F (36.7 ??C)  83  16  100 %            12/06/19 0130  (!)  145/80  98.2 ??F (36.8 ??C)  88  16  97 %     12/06/19 0030  (!) 149/78  98.1 ??F (36.7 ??C)  91  16  99 %     12/05/19 2335  (!) 150/76  97.9 ??F (36.6 ??C)  93  16  100 %     12/05/19 2226  (!) 152/77  97.9 ??F (36.6 ??C)  85  18  98 %     12/05/19 2200  (!) 147/74  --  82  18  94 %     12/05/19 2145  (!) 140/72  --  83  19  --     12/05/19 2130  132/73  --  85  16  95 %     12/05/19 2122  --  97.8 ??F (36.6 ??C)  --  --  --     12/05/19 2120  --  --  78  14  96 %     12/05/19 2115  133/70  --  86  19  92 %     12/05/19 2110  --  --  86  14  97 %     12/05/19 2105  --  --  92  20  96 %     12/05/19 2100  (!) 143/74  --  85  13  97 %     12/05/19 2055  134/74  --  88  15  97 %     12/05/19 2050  130/71  --  89  18  96 %     12/05/19 2045  126/72  --  93  21  95 %            12/05/19 2040  122/72  99 ??F (37.2 ??C)  96  20  95 %           Vascular:   B/L LE   DP 0/4; PT 0/4   capillary fill time brisk, pitting edema is present, skin temperature is cool, varicosities are present.      Dermatological:   Nails are thickened, discolored, painful to palpation, 65mm thick, with subungual debris.              There is no maceration of the interspaces of the feet b/l.  No focal hyperkeratosis      Wound: 1    Location: surgical incisions left forefoot and posterior Achilles   Margins: approximated with sutures intact   Drainage: none   Odor: none   Wound base: skin edges granular   Lymphangitic streaking? No.   Undermining? No.   Sinus tracts? No.   Exposed bone? No.   Subcutaneous crepitation on palpation? No.         Wound:  2    Location: left distal hallux and 2nd toe   Margins: flush, demarcating   Drainage: none   Odor: none   Wound base: necrotic, dry   Lymphangitic streaking? No.   Undermining? No.   Sinus tracts? No.   Exposed bone? No.   Subcutaneous crepitation on  palpation? No.      Neurological:   DTR are present, protective sensation per 5.07 Semmes Weinstein monofilament is absent, patient is AAOx3, mood is normal. Epicritic sensation is intact.      Orthopedic:   B/L LE are symmetric, ROM of ankle, STJ, 1st MTPJ is limited, MMT 5 out of 5 for B/L LE.  No pedal amputations noted      Constitutional: Pt  is a well developed elderly female.            Imaging:   Exam Information            Status  Exam Begun    Exam Ended             Final [99]  12/05/2019  21:04  12/05/2019 ??9:08 PM  [id number] ??9:08 PM     Result Information         Status: Final result (Exam End: 12/05/2019 21:08)  Provider Status: Open     Study Result        EXAM: XR FOOT LT AP/LAT   ??   INDICATION: post op xray s/p transmetatarsal amputation.   ??   COMPARISON: None.   ??   FINDINGS: Two views of the left foot demonstrate transmetatarsal amputation. No   evidence of immediate complication.   ??   IMPRESSION   1. Transmetatarsal amputation.        Exam Information            Status  Exam Begun    Exam Ended             Final [99]  11/29/2019  20:49  11/29/2019 ??9:17 PM  [id number] ??9:17 PM     Result Information         Status: Final result (Exam End: 11/29/2019 21:17)  Provider Status: Open     Study Result        PRELIMINARY REPORT   Soft tissue defect on the dorsal aspect of the first distal phalanx with   underlying marrow edema as seen on the STIR images.   ??   ??   Preliminary report was provided by Dr. Art Buff, the on-call radiologist, at   21:41 hours on 11/29/2019   ??   Final report to follow.   ??   FINAL REPORT BELOW   ??   EXAM:  MRI LOW EXT LT WO CONT   ??   INDICATION: Left foot infection. Diabetes.   ??   COMPARISON: CT left foot on 11/23/2019.   ??   TECHNIQUE: Axial, coronal, and sagittal MRI of the left forefoot in the T1 and   inversion recovery pulse sequences with and without fat saturation .   ??   CONTRAST: None.   ??   FINDINGS:    ??   Bone marrow: Subtle cortical loss of the great toe distal phalanx.  Bone marrow   edema is in the great toe distal phalanx.   ??   Remaining bone marrow signal is heterogeneous but within normal limits. No acute   fracture or osteonecrosis.   ??   Joint fluid: Physiologic.   ??   Tendons: Intact.   ??   Muscles: Mild atrophy. Moderate edema.   ??   Neurovascular bundles: No neuroma. Mild first and third intermetatarsal   bursitis.   ??   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 No septic   arthritis. Normal Lisfranc joint.   ??   Soft tissue mass: Mild cellulitis of the toes. No abscess.   ??   IMPRESSION   ??   1. First distal phalanx osteomyelitis.   2. No abscess.           Exam Information            Status  Exam Begun    Exam Ended  Final [99]  11/23/2019  12:48  11/23/2019 12:52 PM  [id number] 12:52 PM     Result Information         Status: Final result (Exam End: 11/23/2019 12:52)  Provider Status: Open     Study Result        EXAM: XR FOOT LT MIN 3 V   ??   INDICATION: left foot pain.   ??   COMPARISON: None.   ??   FINDINGS: Three views of the left foot demonstrate no fracture or other acute   osseous or articular abnormality. The soft tissues are within normal limits.   Mild osteophytic changes noted, most prominent at the first metatarsophalangeal   joint.   ??   IMPRESSION   No acute abnormality.           11/28/2019   PROCEDURE PERFORMED:  Left mid superficial femoral artery to proximal peroneal artery bypass using nonreversed saphenous vein.      11/25/2019   PROCEDURE PERFORMED:   1.  Abdominal aortogram.   2.  Left leg runoff arteriogram with third order catheterization.   ??   SURGEON:  Belenda Cruise, MD   FINDINGS:   1.  Aortoiliac arteriogram:  The abdominal aorta and iliac arteries are patent without narrowing.   2.  Left leg runoff arteriogram:  The common femoral, profunda femoris, and superficial femoral arteries are patent.  There is no significant stenosis in the superficial femoral artery.  The SFA is occluded at its distal portion at the  junction with  the popliteal artery.  The tibioperoneal trunk reconstitutes with two-vessel posterior tibial and peroneal outflow into the lower leg and foot.  The posterior tibial is a small diffusely narrow artery.  The peroneal artery is the best runoff vessel.   ??   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  The patient will need bypass surgery for limb salvage.         Noninvasive vascular studies    11/26/2019   Interpretation Summary      ??    Pressure measurements at the ankle are consistent with severe, bilateral, lower extremity arterial obstruction.         Lower Extremity Arterial Findings      ABI        ABI is severely abnormal on the right and the left.  PVR waveforms at the ankle are moderately to severely abnormal on the right and  left.  PPG waveforms are flat (non-pulsatile) at the right and left great toe.                 Labs:     Recent Labs           12/06/19   0620     WBC  11.0     CREA  0.89     BUN  16     HGB  9.6*     HCT  28.7*     NA  133*     K  4.1     CL  101     CO2  19*        GLU  214*

## 2019-12-06 NOTE — Progress Notes (Signed)
Bedside shift change report given to [name],RN (oncoming nurse) by Rebeca Alert (offgoing nurse). Report included the following information SBAR, Kardex, Intake/Output and MAR.

## 2019-12-06 NOTE — Progress Notes (Signed)
PHYSICAL THERAPY  Orders acknowledged and chart reviewed in prep for PT re-evaluation. Interviewed nurse and patient. Patient is now NWB on LLE post-L transmetatarsal amputation. Will follow up tomorrow for reassessment as patient is in too much pain and it is not yet well-managed. Discharge plan is to go to [facility] for rehab next week later in the week.  Lindsey Huynh

## 2019-12-06 NOTE — Group Note (Signed)
Diabetes  Mgmt by Mittie Bodo, CNS at 12/06/19 1044                Author: Mittie Bodo, CNS  Service: --  Author Type: Clinical Nurse Specialist       Filed: 12/06/19 1152  Date of Service: 12/06/19 1044  Status: Signed          Editor: Mittie Bodo, CNS (Clinical Nurse Specialist)               [facility] SPECIALIST CONSULT       FOLLOW UP  NOTE     Initial Presentation     Lindsey Huynh is a 69 y.o.  female who presented to the ED 11/23/19 with left foot/great toe pain . Given concerns for dry gangrene and cellulitis, she was started on IV and admitted under hospitalist service   HX:      Past Medical History:        Diagnosis  Date         ?  Diabetes ([place])            insulin + metformin         ?  GERD (gastroesophageal reflux disease)       ?  Hypercholesterolemia           ?  Hypertension              DX: Left great toe osteomyelitis      TX: Vascular surgery/podiatry consulted, Left fem-pop bypass, amputation of left great toe, IV antibiotics        Hospital course     Clinical progress has been complicated by venous insufficiency      S/p Fem-Pop bypass on 3/11 and recovering well-Dr. Timmothy Sours following   Podiatry following for left 1st and 2nd toe diabetic ulcers with necrotic eschar present. NO surgical intervention chosen at this time-treating conservatively.        12/05/19: Patient elected for amputation of left 1st and 2nd toe-cardiology cleared     Diabetes      Patient has known Type 2 diabetes, treated with Levemir and Metformin  PTA.       Admission BG 170 and A1c 8.1% indicate poor diabetes control.    Previous A1C 03/2019 11.0% . Highest A1C Feb. 2020-12.1%.       Ambulatory blood glucose management provided by primary care provider-Last seen 11/05/2019 virtual visit      Consulted by Provider for advanced diabetes nursing assessment and care, specifically related  to    [x]  Home management assessment      Diabetes-related medical  history   Acute complications   NONE   Neurological complications   Peripheral neuropathy   Microvascular disease   NONE   Macrovascular disease   Peripheral vascular disease and Foot wounds   Other associated conditions      HTN/hypercholesteremia      Diabetes medication history        Drug class  Currently in use  Discontinued  Never used          Biguanide  Metformin 1000mg  daily in divided doses              DDP-4 inhibitor                 Sulfonylurea                Thiazolidinedione  GLP-1 RA                SGLT-2 inhibitors                Basal insulin  Levemir 20units daily              Bolus insulin                Fixed Dose  Combinations                Subjective     S/p left transmetatarsal amputation with percutaneous achilles tendon lengthening 3/18   Cardiology signed off as stable post-op   IV antibiotics   Pain control   Lantus 12 units daily   3 units correctional insulin in the last 24 hrs    Decadron 4mg  in OR at 1929   Fasting glucose 242 (but 70 and 77 on labs 2 days prior)   Glucose yesterday while NPO: 80-118     Objective     Physical exam   General Alert, oriented and in no acute distress. Conversant and cooperative.    Vital Signs    Visit Vitals      BP  (!) 170/73 (BP 1 Location: Left upper arm, BP Patient Position: At rest)     Pulse  85     Temp  98.4 ??F (36.9 ??C)     Resp  16     Ht  5\' 5"  (1.651 m)     Wt  76.7 kg (169 lb)     SpO2  100%        BMI  28.12 kg/m??        Skin  Warm and dry.     Heart   Regular rate and rhythm. No murmurs, rubs or gallops   Lungs  Clear to auscultation without rales or rhonchi   Extremities S/p left great toe amputation            Laboratory   BMP:      Lab Results         Component  Value  Date/Time            NA  133 (L)  12/06/2019 06:20 AM       K  4.1  12/06/2019 06:20 AM       CL  101  12/06/2019 06:20 AM       CO2  19 (L)  12/06/2019 06:20 AM       AGAP  13  12/06/2019 06:20 AM       GLU  214 (H)  12/06/2019 06:20 AM       BUN  16   12/06/2019 06:20 AM       CREA  0.89  12/06/2019 06:20 AM       GFRAA  >60  12/06/2019 06:20 AM            GFRNA  >60  12/06/2019 06:20 AM         Factors impacting BG management       Factor  Dose  Comments         Nutrition:   Carb-controlled meals        60 grams/meal          Pain  Left foot           Infection  IV abx (vanc/ cefepime)           Other: venous insuffiencey   Vascular surgery  following  S/p fem pop 11/28/19        Blood glucose pattern                Assessment and Plan        Nursing Diagnosis  Risk for unstable blood glucose pattern     Nursing Intervention Domain  5250 Decision-making Support        Nursing Interventions  Examined current inpatient diabetes control    Explored factors facilitating and impeding inpatient management   Identified self-management practices impeding diabetes control   Explored corrective strategies with patient and responsible inpatient provider    Informed patient of rational for insulin strategy while hospitalized      Instructed patient in importance of checking blood glucose DAILY before meals - discussed when she should check her BG after meals-2hours          Evaluation     [name], with uncontrolled Type 2 diabetes, admitted in the setting of diabetic foot ulcer with osteomyelitis and venus insufficiency now s/p fem/pop bypass and amputation  of the left great toe.  Prior to admission, she did not achieve diabetes control, as evidenced by admission BG of 170 and A1c of 81%.  While her A1C is improved since July 2020 when it was >11%, her chronic elevated glucose is a direct contributor to  her foot wounds.  During the inpatient admission, she had excellent glucose response to low dose basal and correctional insulin.  She did receive a one time dose of decadron in the OR yesterday impacting her fasting glucose this am (242).  Decadron will  elevate glucose for about 24 hrs (until 9pm tonight).  She will need to continue optimal glucose control for wound  heeling.  Inpatient glucose goal 100-180.       Recommendations        [x]  Use of  Subcutaneous Insulin Order set [id number])       Insulin  Dosing  Specific recommendation         CONTINUE   Basal                               (Based on weight, BMI & GFR)  12 units daily     Please reduce to 10 units once daily if    fasting glucose under 100         CONTINUE Corrective                     (Useful in adjusting insulin dosing)  [x]   Normal sensitivity ACHS                  Discharge Planning     1.   Discharge on hospital doses of insulin.   2. Re-start Metformin at PTA doses        Billing Code(s)     [x]  99231 IP subsequent hospital  care - 15 minutes      Before making these care recommendations, I personally reviewed the hospitalization record, including notes, laboratory & diagnostic data and current medications, and    examined the patient at the bedside (circumstances permitting) before making care recommendations.      Total minutes: 70      Mittie Bodo, CNS   Diabetes Clinical Nurse Specialist   [facility]   Access via Perfect Serve

## 2019-12-06 NOTE — Progress Notes (Signed)
Progress Notes by [name], MD at 12/06/19 1124                Author: [name], MD  Service: Hospitalist  Author Type: Physician       Filed: 12/06/19 1127  Date of Service: 12/06/19 1124  Status: Signed          Editor: [name], MD (Physician)                    [facility]                                                                                              Hospitalist Progress Note   [name], MD   Answering service: [phone] OR [phone] from in house phone            Date of Service:  12/06/2019   NAME:  Lindsey Huynh   DOB:  1951-07-03   MRN:  [id number]           Admission Summary:        From H&P 11/23/2019:   "Lindsey Huynh??is a 69 y.o.??female??with past medical history of diabetes, insulin-dependent, GERD and hypertension comes for return of left foot pain and swelling.      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 First distal phalanx osteomyelitis.   2. No abscess.   - IV antibiotics   - Vascular surgery following- Patient to follow up in 2 weeks post- op for staple removal.    - Consult ID for antibiotic recommendations    - Podiatry following -TMA POD 1   ??   LLE pain:??dry gangrene??of??left 2nd toe cellulitic changes/Left popliteal occlusion   -s/p left fem-pop bypass (03/11)   -CT LLE (03/06) NL   -Elevated Sed rate and CRP   -BC (03/06) NGTD    -Pain control   - MRI - 1. First distal phalanx osteomyelitis.. No abscess.s/p amputation   ??   Anemia    Stable, No sign of bleeding noted.    - hgb 7.6 today - Monitor labs and transfuse if hgb <7.0    ??   DMII:??hold metformin while IP   -Cont basal insulin, SSI   -Diabetic diet   -a1c 8.1   - DM following??   ??   HTN   -Controlled    -Cont home meds    ??   GERD   -Cont PPI   ??   Hypokalemia   -potassium 3.2   - Replaced       Code status: Full    DVT prophylaxis: Lovenox       Care Plan discussed with: Patient/Family, Nurse and Case Manager  Anticipated Disposition: SNF/LTC   Anticipated Discharge: 24 hours to 48 hoursmanor care soon awaiting path           Hospital Problems   Date Reviewed:  05/31/2018                         Codes  Class  Noted  POA              Gangrene [facility])  ICD-10-CM: [id number]   ICD-9-CM: 785.4    11/23/2019  Unknown                               Review of Systems:     A comprehensive review of systems was negative except for that written in the HPI.            Vital Signs:      Last 24hrs VS reviewed since prior progress note. Most recent are:   Visit Vitals      BP  (!) 170/73 (BP 1 Location: Left upper arm, BP Patient Position: At rest)     Pulse  85     Temp  98.4 ??F (36.9 ??C)     Resp  16     Ht  5\' 5"  (1.651 m)     Wt  76.7 kg (169 lb)     SpO2  100%        BMI  28.12 kg/m??              Intake/Output Summary (Last 24 hours) at 12/06/2019 1124   Last data filed at 12/05/2019 2122     Gross per 24 hour        Intake  550 ml        Output  10 ml        Net  540 ml              Physical Examination:                     Constitutional:   No acute distress, cooperative     ENT:   Oral mucosa moist, oropharynx benign.      Resp:   CTA bilaterally.     CV:   Regular rhythm, normal rate,          GI:   Soft, non distended, non tender.bs+         Musculoskeletal:   LLE dressing c/d  staples intact         Neurologic:   Moves all extremities.  AAOx3, CN II-XII reviewed, follows commands       Psych:  Good insight, Not anxious nor agitated.         ??                       Data Review:      Review and/or order of clinical lab test   Review and/or order of tests in the medicine section of CPT           Labs:          Recent Labs            12/06/19   0620  12/05/19   0529     WBC  11.0  5.4     HGB  9.6*  7.5*     HCT  28.7*  22.1*  PLT  401*  299          Recent Labs             12/06/19   0620  12/05/19   0529  12/04/19   0457     NA  133*  136  138     K  4.1  3.2*  3.2*     CL  101  104  102     CO2  19*  27  25     BUN  16  14  13      CREA  0.89  0.77  0.87     GLU  214*  77  70          CA  9.8  9.2  9.1        No results for input(s): ALT, AP, TBIL, TBILI, TP, ALB, GLOB, GGT, AML, LPSE in the last 72 hours.      No lab exists for component: SGOT, GPT, AMYP, HLPSE   No results for input(s): INR, PTP, APTT, INREXT, INREXT in the last 72 hours.    No results for input(s): FE, TIBC, PSAT, FERR in the last 72 hours.    No results found for: FOL, RBCF    No results for input(s): PH, PCO2, PO2 in the last 72 hours.   No results for input(s): CPK, CKNDX, TROIQ in the last 72 hours.      No lab exists for component: CPKMB     Lab Results         Component  Value  Date/Time            Cholesterol, total  219 (H)  10/30/2018 10:27 AM       HDL Cholesterol  63  10/30/2018 10:27 AM       LDL, calculated  130 (H)  10/30/2018 10:27 AM       Triglyceride  131  10/30/2018 10:27 AM            CHOL/HDL Ratio  3.4  12/26/2007 09:12 AM          Lab Results         Component  Value  Date/Time            Glucose (POC)  242 (H)  12/06/2019 06:12 AM       Glucose (POC)  118 (H)  12/05/2019 10:33 PM       Glucose (POC)  90  12/05/2019 09:08 PM       Glucose (POC)  80  12/05/2019 04:22 PM            Glucose (POC)  90  12/05/2019 12:20 PM        No results found for: COLOR, APPRN, SPGRU, REFSG, PHU, PROTU, GLUCU, KETU, BILU, UROU, NITU, LEUKU, GLUKE, EPSU, BACTU, WBCU, RBCU, CASTS, UCRY           Medications Reviewed:          Current Facility-Administered Medications          Medication  Dose  Route  Frequency           ?  HYDROmorphone (PF) (DILAUDID) injection 0.5 mg   0.5 mg  IntraVENous  Q4H PRN     ?  alteplase (CATHFLO) 1 mg in sterile water (preservative free) 1 mL injection   1 mg  InterCATHeter  PRN     ?  [START ON 12/07/2019] Vanc trough due 3/20 @ 09:00  Other  ONCE     ?  oxyCODONE IR (ROXICODONE) tablet 10 mg   10 mg  Oral  Q4H PRN     ?  cefepime (MAXIPIME) 2 g in 0.9% sodium chloride (MBP/ADV) 100 mL MBP   2 g  IntraVENous  Q8H     ?  insulin glargine (LANTUS) injection 12 Units   12 Units  SubCUTAneous  QHS     ?  vancomycin (VANCOCIN) 1250 mg in NS 250 ml infusion   1,250 mg  IntraVENous  Q16H     ?  acetaminophen (TYLENOL) tablet 650 mg   650 mg  Oral  Q6H     ?  gabapentin (NEURONTIN) capsule 300 mg   300 mg  Oral  QHS     ?  gabapentin (NEURONTIN) capsule 100 mg   100 mg  Oral  DAILY     ?  Vancomycin- Pharmacy to Dose     Other  Rx Dosing/Monitoring     ?  metroNIDAZOLE (FLAGYL) IVPB premix 500 mg   500 mg  IntraVENous  Q12H     ?  sodium chloride (NS) flush 5-40 mL   5-40 mL  IntraVENous  Q8H     ?  sodium chloride (NS) flush 5-40 mL   5-40 mL  IntraVENous  PRN     ?  sodium phosphate (FLEET'S) enema 1 Enema   1 Enema  Rectal  PRN     ?  oxyCODONE IR (ROXICODONE) tablet 5 mg   5 mg  Oral  Q4H PRN     ?  losartan (COZAAR) tablet 100 mg   100 mg  Oral  DAILY     ?  docusate sodium (COLACE) capsule 100 mg   100 mg  Oral  BID     ?  senna (SENOKOT) tablet 8.6 mg   1 Tab  Oral  DAILY     ?  polyethylene glycol (MIRALAX) packet 17 g   17 g  Oral  DAILY PRN     ?  bisacodyL (DULCOLAX) suppository 10 mg   10 mg  Rectal  DAILY PRN     ?  hydroCHLOROthiazide (HYDRODIURIL) tablet 25 mg   25 mg  Oral  DAILY     ?  sodium chloride (NS) flush 5-40 mL   5-40 mL  IntraVENous  Q8H     ?  sodium chloride (NS) flush 5-40 mL   5-40 mL  IntraVENous  PRN     ?  acetaminophen (TYLENOL) suppository 650 mg   650 mg  Rectal  Q6H PRN     ?  promethazine (PHENERGAN) tablet 12.5 mg   12.5 mg  Oral  Q6H PRN          Or           ?  ondansetron (ZOFRAN) injection 4 mg   4 mg  IntraVENous  Q6H PRN     ?  glucose chewable tablet 16 g   4 Tab  Oral  PRN     ?  dextrose (D50W) injection syrg 12.5-25 g   25-50 mL  IntraVENous  PRN     ?  glucagon (GLUCAGEN) injection 1 mg   1 mg   IntraMUSCular  PRN     ?  insulin lispro (HUMALOG) injection     SubCUTAneous  AC&HS     ?  aspirin delayed-release tablet 81 mg   81 mg  Oral  DAILY     ?  pantoprazole (PROTONIX) tablet 40 mg   40 mg  Oral  ACB     ?  amLODIPine (NORVASC) tablet 10 mg   10 mg  Oral  DAILY           ?  [Held by provider] enoxaparin (LOVENOX) injection 40 mg   40 mg  SubCUTAneous  Q24H        ______________________________________________________________________   EXPECTED LENGTH OF STAY: 4d 2h   ACTUAL LENGTH OF STAY:          13                    [name], MD

## 2019-12-06 NOTE — Progress Notes (Signed)
Vascular:    Stable post left transmet amp done last night.    Left leg incisions OK, palpable graft pulse.    Will follow up next week.

## 2019-12-06 NOTE — Progress Notes (Signed)
[facility]               Division of Cardiology  [phone]                       Progress note              Madolyn Frieze M.D., F.A.C.C.                                      NAME:  Lindsey Huynh   DOB:   10/24/1950   MRN:   [id number]         ICD-10-CM ICD-9-CM    1. Dry gangrene (HCC)  I96 785.4    2. Cellulitis of left lower extremity  L03.116 682.6    3. Hypokalemia  E87.6 276.8    4. Type 2 diabetes mellitus with diabetic peripheral angiopathy and gangrene, with long-term current use of insulin (HCC)  E11.52 250.70     Z79.4 443.81      785.4      V58.67    5. Preoperative clearance  Z01.818 V72.84    6. Gangrene ([place])  I96 785.4                  HPI   very pleasant 69 yo female with h/o HTN, DM and PAD   She is now post op day #7 from left leg bypass and doing well and s/p transmetatarsal left foot  amputation postop day#1    She is doing well cardiac wise.  She denies any chest pain or shortness of breath.  Her only complaint is discomfort and pain at the site of transmetatarsal amputation of her left foot.      Assessment/Plan: 1.  Postop evaluation.  Patient seen preop.  She is doing well from the cardiac standpoint after transmetatarsal amputation of the left foot.  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  She has had no chest pain in or shortness of breath.    Echocardiogram done still pending.  No additional cardiac interventions at this point.  We will follow up on the echocardiogram and unless abnormalities are noted no additional cardiac intervention will be needed for now.    Would recommend outpatient ischemic evaluation given her risk factors and the presence of peripheral arterial disease as well.    2.  PAD: Status post femoropopliteal bypass on November 28, 2019 and doing well.  Closely followed vascular surgery.    3.  Hypertension: She remains hypertensive.  We will defer to primary team but will remain available if  needed.  I suspect the pain is also driving up her blood pressure.  Consider addition of labetalol if needed for blood pressure control    I will sign off for now but remain available as needed.  Echo is currently pending.           CARDIAC STUDIES                      @LASTEKG       IMAGING      CT Results (most recent):  Results from Hospital Encounter encounter on 11/23/19   CT LOW EXT LT W CONT    Narrative EXAM: CT LOW EXT LT W CONT    INDICATION:  Pain. Great toe gangrene     COMPARISON: Radiographs 11/23/2019    CONTRAST: 100 mL of Isovue-300.    TECHNIQUE: Helical CT of the left foot during uneventful rapid bolus intravenous  contrast administration. Coronal and Sagittal reformats were generated. Images  reviewed in soft tissue and bone windows. CT dose reduction was achieved through  the use of a standardized protocol tailored for this examination and automatic  exposure control for dose modulation.    FINDINGS: There is soft tissue swelling demonstrated in the forefoot which is  somewhat greater along the medial and dorsal aspect of the distal hallux. In the  remainder of the forefoot, subcutaneous swelling predominates medially dorsally.  There is substantial irregularity of the nail of the hallux and underlying  nailbed though no evidence for soft tissue gas or soft tissue collection. There  is no demonstration of substantial joint effusion nor CT imaging evidence for  osteomyelitis. 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 Bone  mineralization is perhaps mildly diminished.      Impression No CT imaging evidence for soft tissue gas, abscess or osteomyelitis.       XR Results (most recent):  Results from Hospital Encounter encounter on 11/23/19   XR FOOT LT AP/LAT    Narrative EXAM: XR FOOT LT AP/LAT    INDICATION: post op xray s/p transmetatarsal amputation.    COMPARISON: None.    FINDINGS: Two views of the left foot demonstrate  transmetatarsal amputation. No  evidence of immediate complication.      Impression 1. Transmetatarsal amputation.        MRI Results (most recent):          Past Medical History:   Diagnosis Date   ??? Diabetes ([place])     insulin + metformin   ??? GERD (gastroesophageal reflux disease)    ??? Hypercholesterolemia    ??? Hypertension            Past Surgical History:   Procedure Laterality Date   ??? HX COLONOSCOPY     ??? HX ORTHOPAEDIC  06-30-10    back surgery (tumor removed)   ??? 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         Wt Readings from Last 3 Encounters:   12/05/19 169 lb (76.7 kg)   04/12/19 180 lb (81.6 kg)   10/30/18 168 lb 9.6 oz (76.5 kg)         Review of Systems:   Pertinent items are noted in the History of Present Illness.         No intake/output data recorded.    03/17 1901 - 03/19 0700  In: 550 [I.V.:550]  Out: 10       Telemetry: normal sinus rhythm    Physical Exam:    Neck: no JVD  Heart: regular rate and rhythm  Lungs: clear to auscultation bilaterally  Abdomen: soft, non-tender. Bowel sounds normal. No masses,  no organomegaly  Extremities: no edema    Current Facility-Administered Medications   Medication  Dose Route Frequency   ??? HYDROmorphone (PF) (DILAUDID) injection 0.5 mg  0.5 mg IntraVENous Q4H PRN   ??? cefepime (MAXIPIME) 2 g in 0.9% sodium chloride (MBP/ADV) 100 mL MBP  2 g IntraVENous Q8H   ??? insulin glargine (LANTUS) injection 12 Units  12 Units SubCUTAneous QHS   ??? vancomycin (VANCOCIN) 1250 mg in NS 250 ml infusion  1,250 mg IntraVENous Q16H   ??? acetaminophen (TYLENOL) tablet 650 mg  650 mg Oral Q6H   ??? gabapentin (NEURONTIN) capsule 300 mg  300 mg Oral QHS   ??? gabapentin (NEURONTIN) capsule 100 mg  100 mg Oral DAILY   ??? Vancomycin- Pharmacy to Dose   Other Rx  Dosing/Monitoring   ??? metroNIDAZOLE (FLAGYL) IVPB premix 500 mg  500 mg IntraVENous Q12H   ??? sodium chloride (NS) flush 5-40 mL  5-40 mL IntraVENous Q8H   ??? sodium chloride (NS) flush 5-40 mL  5-40 mL IntraVENous PRN   ??? sodium phosphate (FLEET'S) enema 1 Enema  1 Enema Rectal PRN   ??? oxyCODONE IR (ROXICODONE) tablet 5 mg  5 mg Oral Q4H PRN   ??? losartan (COZAAR) tablet 100 mg  100 mg Oral DAILY   ??? docusate sodium (COLACE) capsule 100 mg  100 mg Oral BID   ??? senna (SENOKOT) tablet 8.6 mg  1 Tab Oral DAILY   ??? polyethylene glycol (MIRALAX) packet 17 g  17 g Oral DAILY PRN   ??? bisacodyL (DULCOLAX) suppository 10 mg  10 mg Rectal DAILY PRN   ??? hydroCHLOROthiazide (HYDRODIURIL) tablet 25 mg  25 mg Oral DAILY   ??? sodium chloride (NS) flush 5-40 mL  5-40 mL IntraVENous Q8H   ??? sodium chloride (NS) flush 5-40 mL  5-40 mL IntraVENous PRN   ??? acetaminophen (TYLENOL) suppository 650 mg  650 mg Rectal Q6H PRN   ??? promethazine (PHENERGAN) tablet 12.5 mg  12.5 mg Oral Q6H PRN    Or   ??? ondansetron (ZOFRAN) injection 4 mg  4 mg IntraVENous Q6H PRN   ??? glucose chewable tablet 16 g  4 Tab Oral PRN   ??? dextrose (D50W) injection syrg 12.5-25 g  25-50 mL IntraVENous PRN   ??? glucagon (GLUCAGEN) injection 1 mg  1 mg IntraMUSCular PRN   ??? insulin lispro (HUMALOG) injection   SubCUTAneous AC&HS   ??? aspirin delayed-release tablet 81 mg  81 mg Oral DAILY   ??? pantoprazole (PROTONIX) tablet 40 mg  40 mg Oral ACB   ??? amLODIPine (NORVASC) tablet 10 mg  10 mg Oral DAILY   ??? [Held by provider] enoxaparin (LOVENOX) injection 40 mg  40 mg SubCUTAneous Q24H         All Cardiac Markers in the last 24 hours:  No results found for: CPK, CK, CKMMB, CKMB, RCK3, CKMBT, CKMBPOC, CKNDX, CKND1, MYO, TROPT, TROIQ, TROI, TROPT, TNIPOC, BNP, BNPP, BNPNT     Lab Results   Component Value Date/Time    Creatinine (POC) 0.7 10/07/2010 09:41 AM    Creatinine 0.89 12/06/2019 06:20 AM          No results found for: CPK, RCK1, RCK2, RCK3, RCK4, CKMB, CKNDX, CKND1,  TROPT, TROIQ, BNPP, BNP      Lab Results   Component Value Date/Time    WBC 11.0 12/06/2019 06:20 AM    HGB 9.6 (L) 12/06/2019 06:20 AM    HCT 28.7 (L) 12/06/2019 06:20 AM    PLATELET 401 (H) 12/06/2019 06:20 AM    MCV 94.1 12/06/2019  06:20 AM         Lab Results   Component Value Date/Time    Sodium 133 (L) 12/06/2019 06:20 AM    Potassium 4.1 12/06/2019 06:20 AM    Chloride 101 12/06/2019 06:20 AM    CO2 19 (L) 12/06/2019 06:20 AM    Anion gap 13 12/06/2019 06:20 AM    Glucose 214 (H) 12/06/2019 06:20 AM    BUN 16 12/06/2019 06:20 AM    Creatinine 0.89 12/06/2019 06:20 AM    BUN/Creatinine ratio 18 12/06/2019 06:20 AM    GFR est AA >60 12/06/2019 06:20 AM    GFR est non-AA >60 12/06/2019 06:20 AM    Calcium 9.8 12/06/2019 06:20 AM         No results found for: BNP, BNPP, BNPPPOC, XBNPT, BNPNT      Lab Results   Component Value Date/Time    Cholesterol, total 219 (H) 10/30/2018 10:27 AM    HDL Cholesterol 63 10/30/2018 10:27 AM    LDL, calculated 130 (H) 10/30/2018 10:27 AM    VLDL, calculated 26 10/30/2018 10:27 AM    Triglyceride 131 10/30/2018 10:27 AM    CHOL/HDL Ratio 3.4 12/26/2007 09:12 AM         Lab Results   Component Value Date/Time    ALT (SGPT) 23 11/23/2019 01:32 PM    Alk. phosphatase 150 (H) 11/23/2019 01:32 PM    Bilirubin, total 0.2 11/23/2019 01:32 PM

## 2019-12-06 NOTE — Progress Notes (Signed)
TOC: Patient accepted to [facility] and BLS to transport when stable for discharge. Awaiting final I/d plan. Followed up with patient and she is agreeable to going to [place] and spoke with Inez Catalina at [facility] to follow up and start authorization.  Communicated with Dr. Lorene Dy that the last covid negative test was done on 3/8. New order placed, awaiting results. CM following for discharged needs.    RUR: 11%    Nathanial Millman RN/CRM

## 2019-12-06 NOTE — Progress Notes (Addendum)
TOC: Patient accepted to [facility] and BLS to transport when stable for discharge. Awaiting final I/d plan. Followed up with patient and she is agreeable to going to [place] and spoke with Inez Catalina at [facility] to follow up and start authorization.  Communicated with Dr. Lorene Dy that the last covid negative test was done on 3/8. New order placed, awaiting results. CM following for discharged needs.    RUR: 11%    Nathanial Millman RN/CRM

## 2019-12-06 NOTE — Progress Notes (Signed)
Bedside shift change report given to [name],RN (oncoming nurse) by Dawna Part (offgoing nurse). Report included the following information SBAR, Kardex, Intake/Output and MAR.

## 2019-12-06 NOTE — Progress Notes (Signed)
Francoise Schaumann, DPM - Greig Castilla K. 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  Musculoskeletal: no joint / extremity pain, misalignment, stiffness, decreased ROM, crepitus.  Integument: No pruritis, rashes, lesions, left foot and leg wounds.  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 Neuromuscular Blockers, Steroidal Other (comments)     GI Upset     History reviewed. No  pertinent family history.   @HXMEDICAL @  Past Surgical History:   Procedure Laterality Date   ??? HX COLONOSCOPY     ??? HX ORTHOPAEDIC  06-30-10    back surgery (tumor removed)   ??? 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 Smoking status: Former Smoker     Years: 10.00   ??? Smokeless tobacco: Never Used   Substance Use Topics   ??? Alcohol use: Yes     Alcohol/week: 5.0 standard drinks     Types: 6 Cans of beer per week       Social History     Substance and Sexual Activity   Alcohol Use Yes   ??? Alcohol/week: 5.0 standard drinks   ??? Types: 6 Cans of beer per week     Social History     Substance and Sexual Activity   Drug Use No      Social History     Tobacco Use   Smoking Status Former Smoker   ??? Years: 10.00   Smokeless Tobacco Never Used  Current Facility-Administered Medications   Medication Dose Route Frequency   ??? HYDROmorphone (PF) (DILAUDID) injection 0.5 mg  0.5 mg IntraVENous Q4H PRN   ??? potassium chloride SR (KLOR-CON 10) tablet 40 mEq  40 mEq Oral BID   ??? cefepime (MAXIPIME) 2 g in 0.9% sodium chloride (MBP/ADV) 100 mL MBP  2 g IntraVENous Q8H   ??? insulin glargine (LANTUS) injection 12 Units  12 Units SubCUTAneous QHS   ??? vancomycin (VANCOCIN) 1250 mg in NS 250 ml infusion  1,250 mg IntraVENous Q16H   ??? acetaminophen (TYLENOL) tablet 650 mg  650 mg Oral Q6H   ??? gabapentin (NEURONTIN) capsule 300 mg  300 mg Oral QHS   ??? gabapentin (NEURONTIN) capsule 100 mg  100 mg Oral DAILY   ??? Vancomycin- Pharmacy to Dose   Other Rx Dosing/Monitoring   ??? metroNIDAZOLE (FLAGYL) IVPB premix 500 mg  500 mg IntraVENous Q12H   ??? sodium chloride (NS) flush 5-40 mL  5-40 mL IntraVENous Q8H   ??? sodium chloride (NS) flush 5-40 mL  5-40 mL IntraVENous PRN   ??? sodium phosphate (FLEET'S) enema 1 Enema  1 Enema Rectal PRN   ??? oxyCODONE IR (ROXICODONE) tablet 5 mg  5 mg Oral Q4H PRN   ??? losartan (COZAAR) tablet 100 mg  100 mg Oral DAILY   ??? docusate sodium (COLACE) capsule 100 mg  100 mg  Oral BID   ??? senna (SENOKOT) tablet 8.6 mg  1 Tab Oral DAILY   ??? polyethylene glycol (MIRALAX) packet 17 g  17 g Oral DAILY PRN   ??? bisacodyL (DULCOLAX) suppository 10 mg  10 mg Rectal DAILY PRN   ??? hydroCHLOROthiazide (HYDRODIURIL) tablet 25 mg  25 mg Oral DAILY   ??? sodium chloride (NS) flush 5-40 mL  5-40 mL IntraVENous Q8H   ??? sodium chloride (NS) flush 5-40 mL  5-40 mL IntraVENous PRN   ??? acetaminophen (TYLENOL) suppository 650 mg  650 mg Rectal Q6H PRN   ??? promethazine (PHENERGAN) tablet 12.5 mg  12.5 mg Oral Q6H PRN    Or   ??? ondansetron (ZOFRAN) injection 4 mg  4 mg IntraVENous Q6H PRN   ??? glucose chewable tablet 16 g  4 Tab Oral PRN   ??? dextrose (D50W) injection syrg 12.5-25 g  25-50 mL IntraVENous PRN   ??? glucagon (GLUCAGEN) injection 1 mg  1 mg IntraMUSCular PRN   ??? insulin lispro (HUMALOG) injection   SubCUTAneous AC&HS   ??? aspirin delayed-release tablet 81 mg  81 mg Oral DAILY   ??? pantoprazole (PROTONIX) tablet 40 mg  40 mg Oral ACB   ??? amLODIPine (NORVASC) tablet 10 mg  10 mg Oral DAILY   ??? [Held by provider] enoxaparin (LOVENOX) injection 40 mg  40 mg SubCUTAneous Q24H        Objective:  Vitals:   Patient Vitals for the past 12 hrs:   BP Temp Pulse Resp SpO2   12/06/19 0238 (!) 166/82 98 ??F (36.7 ??C) 83 16 100 %   12/06/19 0130 (!) 145/80 98.2 ??F (36.8 ??C) 88 16 97 %   12/06/19 0030 (!) 149/78 98.1 ??F (36.7 ??C) 91 16 99 %   12/05/19 2335 (!) 150/76 97.9 ??F (36.6 ??C) 93 16 100 %   12/05/19 2226 (!) 152/77 97.9 ??F (36.6 ??C) 85 18 98 %   12/05/19 2200 (!) 147/74 ??? 82 18 94 %   12/05/19 2145 (!) 140/72 ??? 83 19 ???   12/05/19 2130 132/73 ??? 85  16 95 %   12/05/19 2122 ??? 97.8 ??F (36.6 ??C) ??? ??? ???   12/05/19 2120 ??? ??? 78 14 96 %   12/05/19 2115 133/70 ??? 86 19 92 %   12/05/19 2110 ??? ??? 86 14 97 %   12/05/19 2105 ??? ??? 92 20 96 %   12/05/19 2100 (!) 143/74 ??? 85 13 97 %   12/05/19 2055 134/74 ??? 88 15 97 %   12/05/19 2050 130/71 ??? 89 18 96 %   12/05/19 2045 126/72 ??? 93 21 95 %   12/05/19 2040 122/72 99 ??F (37.2 ??C) 96 20  95 %       Vascular:  B/L LE  DP 0/4; PT 0/4  capillary fill time brisk, pitting edema is present, skin temperature is cool, varicosities are present.    Dermatological:  Nails are thickened, discolored, painful to palpation, 7mm thick, with subungual debris.          There is no maceration of the interspaces of the feet b/l.  No focal hyperkeratosis    Wound: 1   Location: surgical incisions left forefoot and posterior Achilles  Margins: approximated with sutures intact  Drainage: none  Odor: none  Wound base: skin edges granular  Lymphangitic streaking? No.  Undermining? No.  Sinus tracts? No.  Exposed bone? No.  Subcutaneous crepitation on palpation? No.      Wound:  2   Location: left distal hallux and 2nd toe  Margins: flush, demarcating  Drainage: none  Odor: none  Wound base: necrotic, dry  Lymphangitic streaking? No.  Undermining? No.  Sinus tracts? No.  Exposed bone? No.  Subcutaneous crepitation on palpation? No.    Neurological:  DTR are present, protective sensation per 5.07 Semmes Weinstein monofilament is absent, patient is AAOx3, mood is normal. Epicritic sensation is intact.    Orthopedic:  B/L LE are symmetric, ROM of ankle, STJ, 1st MTPJ is limited, MMT 5 out of 5 for B/L LE.  No pedal amputations noted    Constitutional: Pt is a well developed elderly female.        Imaging:  Exam Information    Status Exam Begun  Exam Ended    Final [99] 12/05/2019 21:04 12/05/2019 ??9:08 PM [id number] ??9:08 PM   Result Information    Status: Final result (Exam End: 12/05/2019 21:08) Provider Status: Open   Study Result    EXAM: XR FOOT LT AP/LAT  ??  INDICATION: post op xray s/p transmetatarsal amputation.  ??  COMPARISON: None.  ??  FINDINGS: Two views of the left foot demonstrate transmetatarsal amputation. No  evidence of immediate complication.  ??  IMPRESSION  1. Transmetatarsal amputation.     Exam Information    Status Exam Begun  Exam Ended    Final [99] 11/29/2019 20:49 11/29/2019 ??9:17 PM [id number] ??9:17 PM    Result Information    Status: Final result (Exam End: 11/29/2019 21:17) Provider Status: Open   Study Result    PRELIMINARY REPORT  Soft tissue defect on the dorsal aspect of the first distal phalanx with  underlying marrow edema as seen on the STIR images.  ??  ??  Preliminary report was provided by Dr. Art Buff, the on-call radiologist, at  21:41 hours on 11/29/2019  ??  Final report to follow.  ??  FINAL REPORT BELOW  ??  EXAM:  MRI LOW EXT LT WO CONT  ??  INDICATION: Left foot infection. Diabetes.  ??  COMPARISON: CT left foot  on 11/23/2019.  ??  TECHNIQUE: Axial, coronal, and sagittal MRI of the left forefoot in the T1 and  inversion recovery pulse sequences with and without fat saturation .  ??  CONTRAST: None.  ??  FINDINGS:   ??  Bone marrow: Subtle cortical loss of the great toe distal phalanx. Bone marrow  edema is in the great toe distal phalanx.  ??  Remaining bone marrow signal is heterogeneous but within normal limits. No acute  fracture or osteonecrosis.  ??  Joint fluid: Physiologic.  ??  Tendons: Intact.  ??  Muscles: Mild atrophy. Moderate edema.  ??  Neurovascular bundles: No neuroma. Mild first and third intermetatarsal  bursitis.  ??  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 No septic  arthritis. Normal Lisfranc joint.  ??  Soft tissue mass: Mild cellulitis of the toes. No abscess.  ??  IMPRESSION  ??  1. First distal phalanx osteomyelitis.  2. No abscess.       Exam Information    Status Exam Begun  Exam Ended    Final [99] 11/23/2019 12:48 11/23/2019 12:52 PM [id number] 12:52 PM   Result Information    Status: Final result (Exam End: 11/23/2019 12:52) Provider Status: Open   Study Result    EXAM: XR FOOT LT MIN 3 V  ??  INDICATION: left foot pain.  ??  COMPARISON: None.  ??  FINDINGS: Three views of the left foot demonstrate no fracture or other acute  osseous or articular abnormality. The soft tissues are within normal limits.  Mild osteophytic changes noted, most prominent at the first  metatarsophalangeal  joint.  ??  IMPRESSION  No acute abnormality.       11/28/2019  PROCEDURE PERFORMED:  Left mid superficial femoral artery to proximal peroneal artery bypass using nonreversed saphenous vein.    11/25/2019  PROCEDURE PERFORMED:  1.  Abdominal aortogram.  2.  Left leg runoff arteriogram with third order catheterization.  ??  SURGEON:  Belenda Cruise, MD  FINDINGS:  1.  Aortoiliac arteriogram:  The abdominal aorta and iliac arteries are patent without narrowing.  2.  Left leg runoff arteriogram:  The common femoral, profunda femoris, and superficial femoral arteries are patent.  There is no significant stenosis in the superficial femoral artery.  The SFA is occluded at its distal portion at the junction with the popliteal artery.  The tibioperoneal trunk reconstitutes with two-vessel posterior tibial and peroneal outflow into the lower leg and foot.  The posterior tibial is a small diffusely narrow artery.  The peroneal artery is the best runoff vessel.  ??  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  The patient will need bypass surgery for limb salvage.      Noninvasive vascular studies   11/26/2019  Interpretation Summary    ?? Pressure measurements at the ankle are consistent with severe, bilateral, lower extremity arterial obstruction.      Lower Extremity Arterial Findings    ABI    ABI is severely abnormal on the right and the left.  PVR waveforms at the ankle are moderately to severely abnormal on the right and left.  PPG waveforms are flat (non-pulsatile) at the right and left great toe.           Labs:  Recent Labs     12/06/19  0620   WBC 11.0   CREA 0.89   BUN 16  HGB 9.6*   HCT 28.7*   NA 133*   K 4.1   CL 101   CO2 19*   GLU 214*

## 2019-12-07 LAB — GLUCOSE, POC
Glucose (POC): 158 mg/dL — ABNORMAL HIGH (ref 65–100)
Glucose (POC): 106 mg/dL — ABNORMAL HIGH (ref 65–100)
Glucose (POC): 124 mg/dL — ABNORMAL HIGH (ref 65–100)
Glucose (POC): 222 mg/dL — ABNORMAL HIGH (ref 65–100)

## 2019-12-07 LAB — CBC WITH AUTOMATED DIFF
ABS. BASOPHILS: 0 10*3/uL (ref 0.0–0.1)
ABS. EOSINOPHILS: 1 10*3/uL — ABNORMAL HIGH (ref 0.0–0.4)
ABS. IMM. GRANS.: 0.1 10*3/uL — ABNORMAL HIGH (ref 0.00–0.04)
ABS. LYMPHOCYTES: 0.7 10*3/uL — ABNORMAL LOW (ref 0.8–3.5)
ABS. MONOCYTES: 0.7 10*3/uL (ref 0.0–1.0)
ABS. NEUTROPHILS: 6.3 10*3/uL (ref 1.8–8.0)
ABSOLUTE NRBC: 0 10*3/uL (ref 0.00–0.01)
BASOPHILS: 0 % (ref 0–1)
EOSINOPHILS: 11 % — ABNORMAL HIGH (ref 0–7)
HCT: 23.5 % — ABNORMAL LOW (ref 35.0–47.0)
HGB: 8 g/dL — ABNORMAL LOW (ref 11.5–16.0)
IMMATURE GRANULOCYTES: 1 % — ABNORMAL HIGH (ref 0.0–0.5)
LYMPHOCYTES: 8 % — ABNORMAL LOW (ref 12–49)
MCH: 31.5 PG (ref 26.0–34.0)
MCHC: 34 g/dL (ref 30.0–36.5)
MCV: 92.5 FL (ref 80.0–99.0)
MONOCYTES: 8 % (ref 5–13)
MPV: 9.9 FL (ref 8.9–12.9)
NEUTROPHILS: 72 % (ref 32–75)
NRBC: 0 PER 100 WBC
PLATELET: 337 10*3/uL (ref 150–400)
RBC: 2.54 M/uL — ABNORMAL LOW (ref 3.80–5.20)
RDW: 13.1 % (ref 11.5–14.5)
WBC: 8.8 10*3/uL (ref 3.6–11.0)

## 2019-12-07 LAB — METABOLIC PANEL, BASIC
Anion gap: 5 mmol/L (ref 5–15)
BUN/Creatinine ratio: 19 (ref 12–20)
BUN: 15 MG/DL (ref 6–20)
CO2: 26 mmol/L (ref 21–32)
Calcium: 9.3 MG/DL (ref 8.5–10.1)
Chloride: 104 mmol/L (ref 97–108)
Creatinine: 0.79 MG/DL (ref 0.55–1.02)
GFR est AA: >60 mL/min/{1.73_m2} (ref >60)
GFR est non-AA: >60 mL/min/{1.73_m2} (ref >60)
Glucose: 119 mg/dL — ABNORMAL HIGH (ref 65–100)
Potassium: 3.8 mmol/L (ref 3.5–5.1)
Sodium: 135 mmol/L — ABNORMAL LOW (ref 136–145)

## 2019-12-07 LAB — VANCOMYCIN, TROUGH: Vancomycin,trough: 17.1 ug/mL — ABNORMAL HIGH (ref 5.0–10.0)

## 2019-12-07 LAB — CBC WITH AUTO DIFFERENTIAL
Basophils %: 0 % (ref 0–1)
Basophils Absolute: 0 10*3/uL (ref 0.0–0.1)
Eosinophils %: 11 % — ABNORMAL HIGH (ref 0–7)
Eosinophils Absolute: 1 10*3/uL — ABNORMAL HIGH (ref 0.0–0.4)
Granulocyte Absolute Count: 0.1 10*3/uL — ABNORMAL HIGH (ref 0.00–0.04)
Hematocrit: 23.5 % — ABNORMAL LOW (ref 35.0–47.0)
Hemoglobin: 8 g/dL — ABNORMAL LOW (ref 11.5–16.0)
Immature Granulocytes %: 1 % — ABNORMAL HIGH (ref 0.0–0.5)
Lymphocytes %: 8 % — ABNORMAL LOW (ref 12–49)
Lymphocytes Absolute: 0.7 10*3/uL — ABNORMAL LOW (ref 0.8–3.5)
MCH: 31.5 PG (ref 26.0–34.0)
MCHC: 34 g/dL (ref 30.0–36.5)
MCV: 92.5 FL (ref 80.0–99.0)
MPV: 9.9 FL (ref 8.9–12.9)
Monocytes %: 8 % (ref 5–13)
Monocytes Absolute: 0.7 10*3/uL (ref 0.0–1.0)
NRBC Absolute: 0 10*3/uL (ref 0.00–0.01)
Neutrophils %: 72 % (ref 32–75)
Neutrophils Absolute: 6.3 10*3/uL (ref 1.8–8.0)
Nucleated RBCs: 0 PER 100 WBC
Platelets: 337 10*3/uL (ref 150–400)
RBC: 2.54 M/uL — ABNORMAL LOW (ref 3.80–5.20)
RDW: 13.1 % (ref 11.5–14.5)
WBC: 8.8 10*3/uL (ref 3.6–11.0)

## 2019-12-07 LAB — POCT GLUCOSE
POC Glucose: 158 mg/dL — ABNORMAL HIGH (ref 65–100)
POC Glucose: 106 mg/dL — ABNORMAL HIGH (ref 65–100)
POC Glucose: 124 mg/dL — ABNORMAL HIGH (ref 65–100)
POC Glucose: 222 mg/dL — ABNORMAL HIGH (ref 65–100)

## 2019-12-07 LAB — BASIC METABOLIC PANEL
Anion Gap: 5 mmol/L (ref 5–15)
BUN/Creatinine Ratio: 19 (ref 12–20)
BUN: 15 MG/DL (ref 6–20)
CO2: 26 mmol/L (ref 21–32)
Calcium: 9.3 MG/DL (ref 8.5–10.1)
Chloride: 104 mmol/L (ref 97–108)
Creatinine: 0.79 MG/DL (ref 0.55–1.02)
GFR African American: >60 mL/min/{1.73_m2} (ref >60)
Glucose: 119 mg/dL — ABNORMAL HIGH (ref 65–100)
Potassium: 3.8 mmol/L (ref 3.5–5.1)
Sodium: 135 mmol/L — ABNORMAL LOW (ref 136–145)
eGFR NON-AA: >60 mL/min/{1.73_m2} (ref >60)

## 2019-12-07 LAB — VANCOMYCIN TROUGH: Vancomycin Tr: 17.1 ug/mL — ABNORMAL HIGH (ref 5.0–10.0)

## 2019-12-07 MED FILL — OXYCODONE 5 MG TAB: 5 mg | ORAL | Qty: 2

## 2019-12-07 MED FILL — AMLODIPINE 5 MG TAB: 5 mg | ORAL | Qty: 2

## 2019-12-07 MED FILL — INSULIN LISPRO 100 UNIT/ML INJECTION: 100 unit/mL | SUBCUTANEOUS | Qty: 1

## 2019-12-07 MED FILL — HYDROMORPHONE (PF) 1 MG/ML IJ SOLN: 1 mg/mL | INTRAMUSCULAR | Qty: 1

## 2019-12-07 MED FILL — INSULIN GLARGINE 100 UNIT/ML INJECTION: 100 unit/mL | SUBCUTANEOUS | Qty: 1

## 2019-12-07 MED FILL — GABAPENTIN 100 MG CAP: 100 mg | ORAL | Qty: 1

## 2019-12-07 MED FILL — METRO I.V. 500 MG/100 ML INTRAVENOUS PIGGYBACK: 500 mg/100 mL | INTRAVENOUS | Qty: 100

## 2019-12-07 MED FILL — ACETAMINOPHEN 325 MG TABLET: 325 mg | ORAL | Qty: 2

## 2019-12-07 MED FILL — SENNA LAX 8.6 MG TABLET: 8.6 mg | ORAL | Qty: 1

## 2019-12-07 MED FILL — PANTOPRAZOLE 40 MG TAB, DELAYED RELEASE: 40 mg | ORAL | Qty: 1

## 2019-12-07 MED FILL — HYDROCHLOROTHIAZIDE 25 MG TAB: 25 mg | ORAL | Qty: 1

## 2019-12-07 MED FILL — ASPIRIN 81 MG TAB, DELAYED RELEASE: 81 mg | ORAL | Qty: 1

## 2019-12-07 MED FILL — CEFEPIME 2 GRAM SOLUTION FOR INJECTION: 2 gram | INTRAMUSCULAR | Qty: 2

## 2019-12-07 MED FILL — DOK 100 MG CAPSULE: 100 mg | ORAL | Qty: 1

## 2019-12-07 MED FILL — GABAPENTIN 300 MG CAP: 300 mg | ORAL | Qty: 1

## 2019-12-07 MED FILL — VANCOMYCIN IN 0.9 % SODIUM CHLORIDE 1.25 GRAM/250 ML IV: 1.25 gram/250 mL | INTRAVENOUS | Qty: 250

## 2019-12-07 MED FILL — LOSARTAN 50 MG TAB: 50 mg | ORAL | Qty: 2

## 2019-12-07 NOTE — Progress Notes (Signed)
Bedside and Verbal shift change report given to Macy, RN (oncoming nurse) by Morgan, RN (offgoing nurse). Report included the following information SBAR.

## 2019-12-07 NOTE — Progress Notes (Signed)
Problem: Mobility Impaired (Adult and Pediatric)  Goal: *Acute Goals and Plan of Care (Insert Text)  Description: FUNCTIONAL STATUS PRIOR TO ADMISSION: Patient was independent and active without use of DME.    HOME SUPPORT PRIOR TO ADMISSION: The patient lived alone with no local support.    Physical Therapy  Reassessed post-surgery 12/06/2019  1.  Patient will move from supine to sit and sit to supine , scoot up and down, and roll side to side in bed with supervision/set-up within 7 day(s).    2.  Patient will transfer from bed to chair and chair to bed with supervision/set-up using the least restrictive device within 7 day(s).  3.  Patient will perform sit to stand with supervision/set-up within 7 day(s).  4.  Patient will ambulate with min A for 50 feet with the least restrictive device within 7 day(s).   5.  Patient will ascend/descend 4 stairs with cane and one handrail(s) with supervision/set-up within 7 day(s).   Goals remain appropriate and will continue to work toward same.      Physical Therapy Goals  Initiated 11/26/2019  1.  Patient will move from supine to sit and sit to supine , scoot up and down, and roll side to side in bed with supervision/set-up within 7 day(s).    2.  Patient will transfer from bed to chair and chair to bed with supervision/set-up using the least restrictive device within 7 day(s).  3.  Patient will perform sit to stand with supervision/set-up within 7 day(s).  4.  Patient will ambulate with supervision/set-up for 50 feet with the least restrictive device within 7 day(s).   5.  Patient will ascend/descend 4 stairs with cane and one handrail(s) with supervision/set-up within 7 day(s).     Outcome: Progressing Towards Goal     PHYSICAL THERAPY REEVALUATION  Patient: Lindsey Huynh (69 y.o. 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 This is a reassessment since a new procedure was performed. Today she was received supine in bed. She is aware of her NWB precautions on the LLE. She was CGA for bed mobility for assistance of the LLE to transfer to EOB. She was able to sit at EOB with LLE elevated on trash can and pillow for several minutes. Attempt x 2 for sit-to-stand transfer to RW with gait belt. She was able to sit with Min A for several minutes, before needing to sit due to increased LLE pressure. Due to living alone and being NWB, she would likely benefit from inpatient rehab following acute care discharge.     Current Level of Function Impacting Discharge (mobility/balance): NWB LLE, CGA bed mobility and Min A for sit-to-stand transfer to RW with gait belt.    Functional Outcome Measure:  The patient scored 25 on the Barthel outcome measure which is indicative of high fall risk.      Other factors to consider for discharge: Lives alone in 1-story home with 4 STE with no local support.      Patient will benefit from skilled therapy intervention to address the above noted impairments.       PLAN :  Recommendations and Planned Interventions: bed mobility training, transfer training, gait  training, therapeutic exercises, neuromuscular re-education, orthotic/prosthetic training, modalities, edema management/control, patient and family training/education, and therapeutic activities      Frequency/Duration: Patient will be followed by physical therapy:  daily to address goals.    Recommendation for discharge: (in order for the patient to meet his/her long term goals)  Therapy 3 hours per day 5-7 days per week    This discharge recommendation:  Has been made in collaboration with the attending provider and/or case management    Equipment recommendations for successful  discharge (if) home: patient owns DME required for discharge         SUBJECTIVE:   Patient stated ???It feels like more pressure than pain in the left foot at the moment.???    OBJECTIVE DATA SUMMARY:   HISTORY:    Past Medical History:   Diagnosis Date    Diabetes ([place])     insulin + metformin    GERD (gastroesophageal reflux disease)     Hypercholesterolemia     Hypertension      Past Surgical History:   Procedure Laterality Date    HX COLONOSCOPY      HX ORTHOPAEDIC  06-30-10    back surgery (tumor removed)    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     Personal factors and/or comorbidities impacting plan of care: NWB LLE, left knee swelling and discomfort     Home Situation  Home Environment: Private residence  # Steps to Enter: 4  Rails to Enter: Yes  Hand Rails : Right  Wheelchair Ramp: No  One/Two Story Residence: One story  Living Alone: Yes  Support Systems: Family member(s)  Patient Expects to be Discharged to:: Other (comment)(home vs rehab)  Current DME Used/Available at Home: [facility]  Tub or Shower Type: Tub/Shower combination    EXAMINATION/PRESENTATION/DECISION MAKING:   Critical Behavior:  Neurologic State: Alert  Orientation Level: Oriented X4  Cognition: Appropriate decision making, Appropriate safety awareness, Appropriate for age attention/concentration  Safety/Judgement: Awareness of environment  Hearing:     Skin:    Edema: 1+ edema in left knee and ankle   Range Of Motion:  AROM: Generally decreased, functional  PROM: Within functional limits        Strength:    Strength: Generally decreased, functional       Tone & Sensation:   Tone: Normal    Sensation: Intact       Coordination:  Coordination: Generally decreased, functional  Vision:      Functional Mobility:  Bed Mobility:  Rolling: Contact guard assistance   Supine to Sit: Contact guard assistance  Sit to Supine: Minimum assistance  Scooting: Stand-by assistance  Transfers:  Sit to Stand: Minimum assistance  Stand to Sit: Minimum assistance    Balance:   Sitting: Intact  Standing: Impaired;With support  Ambulation/Gait Training:       Left Side Weight Bearing: Non-weight bearing         Functional Measure:  Barthel Index:    Bathing: 0  Bladder: 5  Bowels: 0  Grooming: 0  Dressing: 5  Feeding: 10  Mobility: 5  Stairs: 0  Toilet Use: 0  Transfer (Bed to Chair and Back): 0  Total: 25/100       The Barthel ADL Index: Guidelines  1. The index  should be used as a record of what a patient does, not as a record of what a patient could do.  2. The main aim is to establish degree of independence from any help, physical or verbal, however minor and for whatever reason.  3. The need for supervision renders the patient not independent.  4. A patient's performance should be established using the best available evidence. Asking the patient, friends/relatives and nurses are the usual sources, but direct observation and common sense are also important. However direct testing is not needed.  5. Usually the patient's performance over the preceding 24-48 hours is important, but occasionally longer periods will be relevant.  6. Middle categories imply that the patient supplies over 50 per cent of the effort.  7. Use of aids to be independent is allowed.    Daneen Schick., Barthel, D.W. [id number]). Functional evaluation: the Barthel Index. [place] (14)2.  Lucianne Lei der Carney, J.J.M.F, [place], Diona Browner., Oris Drone., [place], [place] (1999). Measuring the change indisability after inpatient rehabilitation; comparison of the responsiveness of the Barthel Index and Functional Independence Measure. Journal of Neurology, Neurosurgery, and Psychiatry, 66(4), [phone].  Wilford Sports, N.J.A, Scholte op Daniel,  W.J.M, & Koopmanschap, M.A. (2004.) Assessment of post-stroke quality of life in cost-effectiveness  studies: The usefulness of the Barthel Index and the EuroQoL-5D. Quality of Life Research, 13, [phone]           Pain Rating:  7/10    Activity Tolerance:   Fair and requires rest breaks    After treatment patient left in no apparent distress:   Supine in bed and Call bell within reach    COMMUNICATION/EDUCATION:   The patient???s plan of care was discussed with: Registered nurse.     Fall prevention education was provided and the patient/caregiver indicated understanding., Patient/family have participated as able in goal setting and plan of care., and Patient/family agree to work toward stated goals and plan of care.    Thank you for this referral.  Ricka Burdock, PT   Time Calculation: 30 mins

## 2019-12-07 NOTE — Progress Notes (Signed)
[facility]                                                                                          Hospitalist Progress Note  [name], MD  Answering service: [phone] OR [phone] from in house phone        Date of Service:  12/07/2019  NAME:  Lindsey Huynh  DOB:  1951-03-18  MRN:  [id number]      Admission Summary:   From H&P 11/23/2019:  "Lindsey Huynh??is a 69 y.o.??female??with past medical history of diabetes, insulin-dependent, GERD and hypertension comes for return of left foot pain and swelling.    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 First distal phalanx osteomyelitis.  - IV antibiotics  - Vascular surgery following- Patient to follow up in 2 weeks post- op for staple removal.   - Consult ID for antibiotic recommendations   - Podiatry following -TMA POD 2  ??  LLE pain:??dry gangrene??of??left 2nd toe cellulitic changes/Left popliteal occlusion  -s/p left fem-pop bypass (03/11)  -CT LLE (03/06) NL  -Elevated Sed rate and CRP  -BC (03/06) NGTD   -Pain control  - MRI - 1. First distal phalanx osteomyelitis.. No abscess.s/p amputation  ??  Anemia   Stable, No sign of bleeding noted.   - hgb 7.6 today - Monitor labs and transfuse if hgb <7.0   ??  DMII:??hold metformin while IP  -Cont basal insulin, SSI  -Diabetic diet  -a1c 8.1  - DM following??  ??  HTN  -Controlled  -Cont home meds   ??  GERD  -Cont PPI  ??  Hypokalemia  -potassium 3.2  - Replaced     Code status: Full   DVT prophylaxis: Lovenox     Care Plan discussed with: Patient/Family, Nurse and Case Manager  Anticipated Disposition: SNF/LTC  Anticipated Discharge: 24 hours to 48 hoursmanor care soon awaiting path     Hospital Problems  Date  Reviewed: June 01, 2018          Codes Class Noted POA    Gangrene ([place]) ICD-10-CM: [id number]  ICD-9-CM: 785.4  11/23/2019 Unknown                Review of Systems:   A comprehensive review of systems was negative except for that written in the HPI.       Vital Signs:    Last 24hrs VS reviewed since prior progress note. Most recent are:  Visit Vitals  BP 113/70 (BP 1 Location: Left upper arm, BP Patient Position: At rest)   Pulse 97   Temp 98.6 ??F (37 ??C)   Resp 16   Ht 5\' 5"  (1.651 m)   Wt 76.7 kg (169 lb)   SpO2 95%   BMI 28.12 kg/m??  Intake/Output Summary (Last 24 hours) at 12/07/2019 1124  Last data filed at 12/07/2019 0449  Gross per 24 hour   Intake ???   Output 2200 ml   Net -2200 ml        Physical Examination:             Constitutional:  No acute distress, cooperative   ENT:  Oral mucosa moist, oropharynx benign.    Resp:  CTA bilaterally.   CV:  Regular rhythm, normal rate,     GI:  Soft, non distended, non tender.bs+    Musculoskeletal:  LLE dressing c/d  staples intact    Neurologic:  Moves all extremities.  AAOx3, CN II-XII reviewed, follows commands     Psych:  Good insight, Not anxious nor agitated.     ??            Data Review:    Review and/or order of clinical lab test  Review and/or order of tests in the medicine section of CPT      Labs:     Recent Labs     12/07/19  0436 12/06/19  0620   WBC 8.8 11.0   HGB 8.0* 9.6*   HCT 23.5* 28.7*   PLT 337 401*     Recent Labs     12/07/19  0436 12/06/19  0620 12/05/19  0529   NA 135* 133* 136   K 3.8 4.1 3.2*   CL 104 101 104   CO2 26 19* 27   BUN 15 16 14    CREA 0.79 0.89 0.77   GLU 119* 214* 77   CA 9.3 9.8 9.2     No results for input(s): ALT, AP, TBIL, TBILI, TP, ALB, GLOB, GGT, AML, LPSE in the last 72 hours.    No lab exists for component: SGOT, GPT, AMYP, HLPSE  No results for input(s): INR, PTP, APTT, INREXT, INREXT in the last 72 hours.   No results for input(s): FE, TIBC, PSAT, FERR in the last 72 hours.   No results found for: FOL, RBCF   No results for  input(s): PH, PCO2, PO2 in the last 72 hours.  No results for input(s): CPK, CKNDX, TROIQ in the last 72 hours.    No lab exists for component: CPKMB  Lab Results   Component Value Date/Time    Cholesterol, total 219 (H) 10/30/2018 10:27 AM    HDL Cholesterol 63 10/30/2018 10:27 AM    LDL, calculated 130 (H) 10/30/2018 10:27 AM    Triglyceride 131 10/30/2018 10:27 AM    CHOL/HDL Ratio 3.4 12/26/2007 09:12 AM     Lab Results   Component Value Date/Time    Glucose (POC) 106 (H) 12/07/2019 06:16 AM    Glucose (POC) 158 (H) 12/06/2019 09:20 PM    Glucose (POC) 139 (H) 12/06/2019 04:28 PM    Glucose (POC) 142 (H) 12/06/2019 11:39 AM    Glucose (POC) 242 (H) 12/06/2019 06:12 AM     No results found for: COLOR, APPRN, SPGRU, REFSG, PHU, PROTU, GLUCU, KETU, BILU, UROU, NITU, LEUKU, GLUKE, EPSU, BACTU, WBCU, RBCU, CASTS, UCRY      Medications Reviewed:     Current Facility-Administered Medications   Medication Dose Route Frequency   ??? HYDROmorphone (PF) (DILAUDID) injection 0.5 mg  0.5 mg IntraVENous Q4H PRN   ??? alteplase (CATHFLO) 1 mg in sterile water (preservative free) 1 mL injection  1 mg InterCATHeter PRN   ??? oxyCODONE IR (ROXICODONE) tablet 10 mg  10 mg Oral Q4H PRN   ??? cefepime (MAXIPIME) 2 g in 0.9% sodium chloride (MBP/ADV) 100 mL MBP  2 g IntraVENous Q8H   ??? insulin glargine (LANTUS) injection 12 Units  12 Units SubCUTAneous QHS   ??? vancomycin (VANCOCIN) 1250 mg in NS 250 ml infusion  1,250 mg IntraVENous Q16H   ??? acetaminophen (TYLENOL) tablet 650 mg  650 mg Oral Q6H   ??? gabapentin (NEURONTIN) capsule 300 mg  300 mg Oral QHS   ??? gabapentin (NEURONTIN) capsule 100 mg  100 mg Oral DAILY   ??? Vancomycin- Pharmacy to Dose   Other Rx Dosing/Monitoring   ??? metroNIDAZOLE (FLAGYL) IVPB premix 500 mg  500 mg IntraVENous Q12H   ??? sodium chloride (NS) flush 5-40 mL  5-40 mL IntraVENous Q8H   ??? sodium chloride (NS) flush 5-40 mL  5-40 mL IntraVENous PRN   ??? sodium phosphate (FLEET'S) enema 1 Enema  1 Enema Rectal PRN   ???  oxyCODONE IR (ROXICODONE) tablet 5 mg  5 mg Oral Q4H PRN   ??? losartan (COZAAR) tablet 100 mg  100 mg Oral DAILY   ??? docusate sodium (COLACE) capsule 100 mg  100 mg Oral BID   ??? senna (SENOKOT) tablet 8.6 mg  1 Tab Oral DAILY   ??? polyethylene glycol (MIRALAX) packet 17 g  17 g Oral DAILY PRN   ??? bisacodyL (DULCOLAX) suppository 10 mg  10 mg Rectal DAILY PRN   ??? hydroCHLOROthiazide (HYDRODIURIL) tablet 25 mg  25 mg Oral DAILY   ??? sodium chloride (NS) flush 5-40 mL  5-40 mL IntraVENous Q8H   ??? sodium chloride (NS) flush 5-40 mL  5-40 mL IntraVENous PRN   ??? acetaminophen (TYLENOL) suppository 650 mg  650 mg Rectal Q6H PRN   ??? promethazine (PHENERGAN) tablet 12.5 mg  12.5 mg Oral Q6H PRN    Or   ??? ondansetron (ZOFRAN) injection 4 mg  4 mg IntraVENous Q6H PRN   ??? glucose chewable tablet 16 g  4 Tab Oral PRN   ??? dextrose (D50W) injection syrg 12.5-25 g  25-50 mL IntraVENous PRN   ??? glucagon (GLUCAGEN) injection 1 mg  1 mg IntraMUSCular PRN   ??? insulin lispro (HUMALOG) injection   SubCUTAneous AC&HS   ??? aspirin delayed-release tablet 81 mg  81 mg Oral DAILY   ??? pantoprazole (PROTONIX) tablet 40 mg  40 mg Oral ACB   ??? amLODIPine (NORVASC) tablet 10 mg  10 mg Oral DAILY   ??? [Held by provider] enoxaparin (LOVENOX) injection 40 mg  40 mg SubCUTAneous Q24H     ______________________________________________________________________  EXPECTED LENGTH OF STAY: 4d 2h  ACTUAL LENGTH OF STAY:          [place], MD

## 2019-12-07 NOTE — Progress Notes (Signed)
Pharmacist Note - Vancomycin Dosing  Therapy day 15  Indication: Osteomyelitis - first distal phalanx of L great toe; dry gangrene on L 2nd toe  - S/P L TMA on 3/18  Current regimen: 1250 mg IV every 16 hours    Recent Labs     12/07/19  0436 12/06/19  0620 12/05/19  0529   WBC 8.8 11.0 5.4   CREA 0.79 0.89 0.77   BUN 15 16 14        A Trough Level resulted at 17.1 mcg/mL which was obtained 16.25 hrs post-dose.  The extrapolated "true" trough is approximately 17.3 mcg/mL based on the patient's known kinetics.       Goal trough: 15 - 20 mcg/mL     Plan: Continue current regimen. Pharmacy will continue to monitor this patient daily for changes in clinical status and renal function.

## 2019-12-07 NOTE — Progress Notes (Signed)
 Problem: Mobility Impaired (Adult and Pediatric)  Goal: *Acute Goals and Plan of Care (Insert Text)  Description: FUNCTIONAL STATUS PRIOR TO ADMISSION: Patient was independent and active without use of DME.    HOME SUPPORT PRIOR TO ADMISSION: The patient lived alone with no local support.    Physical Therapy  Reassessed post-surgery 12/06/2019  1.  Patient will move from supine to sit and sit to supine , scoot up and down, and roll side to side in bed with supervision/set-up within 7 day(s).    2.  Patient will transfer from bed to chair and chair to bed with supervision/set-up using the least restrictive device within 7 day(s).  3.  Patient will perform sit to stand with supervision/set-up within 7 day(s).  4.  Patient will ambulate with min A for 50 feet with the least restrictive device within 7 day(s).   5.  Patient will ascend/descend 4 stairs with cane and one handrail(s) with supervision/set-up within 7 day(s).   Goals remain appropriate and will continue to work toward same.      Physical Therapy Goals  Initiated 11/26/2019  1.  Patient will move from supine to sit and sit to supine , scoot up and down, and roll side to side in bed with supervision/set-up within 7 day(s).    2.  Patient will transfer from bed to chair and chair to bed with supervision/set-up using the least restrictive device within 7 day(s).  3.  Patient will perform sit to stand with supervision/set-up within 7 day(s).  4.  Patient will ambulate with supervision/set-up for 50 feet with the least restrictive device within 7 day(s).   5.  Patient will ascend/descend 4 stairs with cane and one handrail(s) with supervision/set-up within 7 day(s).     Outcome: Progressing Towards Goal     PHYSICAL THERAPY REEVALUATION  Patient: Lindsey Huynh (69 y.o. female)  Date: 12/07/2019  Primary Diagnosis: Gangrene (HCC) [I96]  Procedure(s) (LRB):  LEFT TRANSMETATARSAL AMPUTATION WITH TENDON BALANCING (Left) 2 Days Post-Op   Precautions:   NWB(LLE)      ASSESSMENT  Based on the objective data described below, the patient presents with left foot pain and decreased functional mobility following left trans-met amputation on 12/05/2019. This is a reassessment since a new procedure was performed. Today she was received supine in bed. She is aware of her NWB precautions on the LLE. She was CGA for bed mobility for assistance of the LLE to transfer to EOB. She was able to sit at EOB with LLE elevated on trash can and pillow for several minutes. Attempt x 2 for sit-to-stand transfer to RW with gait belt. She was able to sit with Min A for several minutes, before needing to sit due to increased LLE pressure. Due to living alone and being NWB, she would likely benefit from inpatient rehab following acute care discharge.     Current Level of Function Impacting Discharge (mobility/balance): NWB LLE, CGA bed mobility and Min A for sit-to-stand transfer to RW with gait belt.    Functional Outcome Measure:  The patient scored 25 on the Barthel outcome measure which is indicative of high fall risk.      Other factors to consider for discharge: Lives alone in 1-story home with 4 STE with no local support.      Patient will benefit from skilled therapy intervention to address the above noted impairments.       PLAN :  Recommendations and Planned Interventions: bed mobility training, transfer training, gait  training, therapeutic exercises, neuromuscular re-education, orthotic/prosthetic training, modalities, edema management/control, patient and family training/education, and therapeutic activities      Frequency/Duration: Patient will be followed by physical therapy:  daily to address goals.    Recommendation for discharge: (in order for the patient to meet his/her long term goals)  Therapy 3 hours per day 5-7 days per week    This discharge recommendation:  Has been made in collaboration with the attending provider and/or case management    Equipment recommendations  for successful discharge (if) home: patient owns DME required for discharge         SUBJECTIVE:   Patient stated "It feels like more pressure than pain in the left foot at the moment."    OBJECTIVE DATA SUMMARY:   HISTORY:    Past Medical History:   Diagnosis Date    Diabetes (HCC)     insulin  + metformin     GERD (gastroesophageal reflux disease)     Hypercholesterolemia     Hypertension      Past Surgical History:   Procedure Laterality Date    HX COLONOSCOPY      HX ORTHOPAEDIC  06-30-10    back surgery (tumor removed)    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     Personal factors and/or comorbidities impacting plan of care: NWB LLE, left knee swelling and discomfort     Home Situation  Home Environment: Private residence  # Steps to Enter: 4  Rails to Enter: Yes  Hand Rails : Right  Wheelchair Ramp: No  One/Two Story Residence: One story  Living Alone: Yes  Support Systems: Family member(s)  Patient Expects to be Discharged to:: Other (comment)(home vs rehab)  Current DME Used/Available at Home: Walker  Tub or Shower Type: Tub/Shower combination    EXAMINATION/PRESENTATION/DECISION MAKING:   Critical Behavior:  Neurologic State: Alert  Orientation Level: Oriented X4  Cognition: Appropriate decision making, Appropriate safety awareness, Appropriate for age attention/concentration  Safety/Judgement: Awareness of environment  Hearing:     Skin:    Edema: 1+ edema in left knee and ankle   Range Of Motion:  AROM: Generally decreased, functional  PROM: Within functional limits        Strength:    Strength: Generally decreased, functional       Tone & Sensation:   Tone: Normal    Sensation: Intact       Coordination:  Coordination: Generally decreased, functional  Vision:      Functional Mobility:  Bed Mobility:  Rolling: Contact guard  assistance  Supine to Sit: Contact guard assistance  Sit to Supine: Minimum assistance  Scooting: Stand-by assistance  Transfers:  Sit to Stand: Minimum assistance  Stand to Sit: Minimum assistance    Balance:   Sitting: Intact  Standing: Impaired;With support  Ambulation/Gait Training:       Left Side Weight Bearing: Non-weight bearing         Functional Measure:  Barthel Index:    Bathing: 0  Bladder: 5  Bowels: 0  Grooming: 0  Dressing: 5  Feeding: 10  Mobility: 5  Stairs: 0  Toilet Use: 0  Transfer (Bed to Chair and Back): 0  Total: 25/100       The Barthel ADL Index: Guidelines  1. The index  should be used as a record of what a patient does, not as a record of what a patient could do.  2. The main aim is to establish degree of independence from any help, physical or verbal, however minor and for whatever reason.  3. The need for supervision renders the patient not independent.  4. A patient's performance should be established using the best available evidence. Asking the patient, friends/relatives and nurses are the usual sources, but direct observation and common sense are also important. However direct testing is not needed.  5. Usually the patient's performance over the preceding 24-48 hours is important, but occasionally longer periods will be relevant.  6. Middle categories imply that the patient supplies over 50 per cent of the effort.  7. Use of aids to be independent is allowed.    Henriette Desanctis., Barthel, D.W. [phone]). Functional evaluation: the Barthel Index. Md State Med J (14)2.  Fleeta der Jansen, J.J.M.F, [place], DAIVD., [place]., [place], [place]. (1999). Measuring the change indisability after inpatient rehabilitation; comparison of the responsiveness of the Barthel Index and Functional Independence Measure. Journal of Neurology, Neurosurgery, and Psychiatry, 66(4), [phone].  Fleeta Chillington, N.J.A, [place]olte [place],  W.J.M, & Koopmanschap, M.A. (2004.) Assessment of post-stroke quality of life in  cost-effectiveness studies: The usefulness of the Barthel Index and the EuroQoL-5D. Quality of Life Research, 13, 427-43           Pain Rating:  7/10    Activity Tolerance:   Fair and requires rest breaks    After treatment patient left in no apparent distress:   Supine in bed and Call bell within reach    COMMUNICATION/EDUCATION:   The patient's plan of care was discussed with: Registered nurse.     Fall prevention education was provided and the patient/caregiver indicated understanding., Patient/family have participated as able in goal setting and plan of care., and Patient/family agree to work toward stated goals and plan of care.    Thank you for this referral.  Alan LITTIE Churn, PT   Time Calculation: 30 mins

## 2019-12-07 NOTE — Progress Notes (Signed)
[facility]               Division of Cardiology  [phone]                       Progress note              Telford Nab, MD  NAME:  Lindsey Huynh   DOB:   1951/09/14   MRN:   [id number]         HPI   very pleasant 69 yo female with h/o HTN, DM and PAD   She is now post op day #7 from left leg bypass and doing well and s/p transmetatarsal left foot  amputation postop day#1    She is doing well cardiac wise.  She denies any chest pain or shortness of breath.  Her only complaint is discomfort and pain at the site of transmetatarsal amputation of her left foot.      Assessment/Plan: 1.  Postop evaluation.  Patient seen preop.  She is doing well from the cardiac standpoint after transmetatarsal amputation of the left foot.  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  She has had no chest pain in or shortness of breath.    Echocardiogram done still pending.  No additional cardiac interventions at this point.  We will follow up on the echocardiogram and unless abnormalities are noted no additional cardiac intervention will be needed for now.    Would recommend outpatient ischemic evaluation given her risk factors and the presence of peripheral arterial disease as well.    2.  PAD: Status post femoropopliteal bypass on November 28, 2019 and doing well.  Closely followed vascular surgery.    3.  Hypertension: She remains hypertensive.  Cont to adjust meds    4. Echo with possible calcified vegetation on aortic valve- will order bcx x 3 in setting of osteomyelitis.  no wbc elevation, fever, etc. On broad spectrum antibiotics.            CARDIAC STUDIES                      @LASTEKG       IMAGING      CT Results (most recent):  Results from Hospital Encounter encounter on 11/23/19   CT LOW EXT LT W CONT    Narrative EXAM: CT LOW EXT LT W CONT    INDICATION: Pain. Great toe gangrene     COMPARISON: Radiographs 11/23/2019    CONTRAST: 100 mL of  Isovue-300.    TECHNIQUE: Helical CT of the left foot during uneventful rapid bolus intravenous  contrast administration. Coronal and Sagittal reformats were generated. Images  reviewed in soft tissue and bone windows. CT dose reduction was achieved through  the use of a standardized protocol tailored for this examination and automatic  exposure control for dose modulation.    FINDINGS: There is soft tissue swelling demonstrated in the forefoot which is  somewhat greater along the medial and dorsal aspect of the distal hallux. In the  remainder of the forefoot, subcutaneous swelling predominates medially dorsally.  There is substantial irregularity of the nail of the hallux and underlying  nailbed though no evidence for soft tissue gas or soft tissue collection. There  is no demonstration of substantial joint effusion nor CT imaging evidence for  osteomyelitis. 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 Bone  mineralization is perhaps mildly diminished.      Impression No CT imaging evidence for soft tissue gas, abscess or osteomyelitis.       XR Results (most recent):  Results from Hospital Encounter encounter on 11/23/19   XR FOOT LT AP/LAT    Narrative EXAM: XR FOOT LT AP/LAT    INDICATION: post op xray s/p transmetatarsal amputation.    COMPARISON: None.    FINDINGS: Two views of the left foot demonstrate transmetatarsal amputation. No  evidence of immediate complication.      Impression 1. Transmetatarsal amputation.        MRI Results (most recent):          Past Medical History:   Diagnosis Date   ??? Diabetes ([place])     insulin + metformin   ??? GERD (gastroesophageal reflux disease)    ??? Hypercholesterolemia    ??? Hypertension            Past Surgical History:   Procedure Laterality Date   ??? HX COLONOSCOPY     ??? HX ORTHOPAEDIC  06-30-10    back surgery (tumor removed)   ??? 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         Wt Readings from Last 3 Encounters:   12/05/19 169 lb (76.7 kg)   04/12/19 180 lb (81.6 kg)   10/30/18 168 lb 9.6 oz (76.5 kg)         Review of Systems:   Pertinent items are noted in the History of Present Illness.         03/19 1901 - 03/20 0700  In: -   Out: 1300 [Urine:1300]    03/18 0701 - 03/19 1900  In: 1050 [P.O.:500; I.V.:550]  Out: 910 [Urine:900]      Telemetry: normal sinus rhythm    Physical Exam:    Neck: no JVD  Heart: regular rate and rhythm  Lungs: clear to auscultation bilaterally  Abdomen: soft, non-tender. Bowel sounds normal. No masses,  no organomegaly  Extremities: no edema    Current Facility-Administered Medications   Medication Dose Route Frequency   ??? HYDROmorphone (PF) (DILAUDID) injection 0.5 mg  0.5 mg IntraVENous Q4H PRN   ??? alteplase (CATHFLO) 1 mg in sterile water (preservative free) 1 mL injection  1 mg InterCATHeter PRN   ??? Vanc trough due 3/20 @ 09:00   Other ONCE   ??? oxyCODONE IR (ROXICODONE) tablet 10 mg  10 mg Oral Q4H PRN   ??? cefepime (MAXIPIME) 2 g in 0.9% sodium chloride (MBP/ADV) 100 mL MBP  2 g IntraVENous Q8H   ??? insulin glargine (LANTUS) injection 12 Units  12 Units SubCUTAneous QHS   ??? vancomycin (VANCOCIN) 1250 mg in NS 250 ml infusion  1,250 mg IntraVENous Q16H   ??? acetaminophen (TYLENOL) tablet 650 mg  650 mg Oral Q6H   ??? gabapentin (NEURONTIN) capsule 300 mg  300 mg Oral QHS   ??? gabapentin (NEURONTIN) capsule 100 mg  100 mg Oral DAILY   ??? Vancomycin- Pharmacy to Dose  Other Rx Dosing/Monitoring   ??? metroNIDAZOLE (FLAGYL) IVPB premix 500 mg  500 mg IntraVENous Q12H   ??? sodium chloride (NS) flush 5-40 mL  5-40 mL IntraVENous Q8H   ??? sodium chloride (NS) flush 5-40 mL  5-40 mL IntraVENous PRN   ??? sodium phosphate (FLEET'S) enema 1 Enema  1 Enema Rectal PRN   ??? oxyCODONE IR (ROXICODONE) tablet 5 mg  5 mg  Oral Q4H PRN   ??? losartan (COZAAR) tablet 100 mg  100 mg Oral DAILY   ??? docusate sodium (COLACE) capsule 100 mg  100 mg Oral BID   ??? senna (SENOKOT) tablet 8.6 mg  1 Tab Oral DAILY   ??? polyethylene glycol (MIRALAX) packet 17 g  17 g Oral DAILY PRN   ??? bisacodyL (DULCOLAX) suppository 10 mg  10 mg Rectal DAILY PRN   ??? hydroCHLOROthiazide (HYDRODIURIL) tablet 25 mg  25 mg Oral DAILY   ??? sodium chloride (NS) flush 5-40 mL  5-40 mL IntraVENous Q8H   ??? sodium chloride (NS) flush 5-40 mL  5-40 mL IntraVENous PRN   ??? acetaminophen (TYLENOL) suppository 650 mg  650 mg Rectal Q6H PRN   ??? promethazine (PHENERGAN) tablet 12.5 mg  12.5 mg Oral Q6H PRN    Or   ??? ondansetron (ZOFRAN) injection 4 mg  4 mg IntraVENous Q6H PRN   ??? glucose chewable tablet 16 g  4 Tab Oral PRN   ??? dextrose (D50W) injection syrg 12.5-25 g  25-50 mL IntraVENous PRN   ??? glucagon (GLUCAGEN) injection 1 mg  1 mg IntraMUSCular PRN   ??? insulin lispro (HUMALOG) injection   SubCUTAneous AC&HS   ??? aspirin delayed-release tablet 81 mg  81 mg Oral DAILY   ??? pantoprazole (PROTONIX) tablet 40 mg  40 mg Oral ACB   ??? amLODIPine (NORVASC) tablet 10 mg  10 mg Oral DAILY   ??? [Held by provider] enoxaparin (LOVENOX) injection 40 mg  40 mg SubCUTAneous Q24H         All Cardiac Markers in the last 24 hours:  No results found for: CPK, CK, CKMMB, CKMB, RCK3, CKMBT, CKMBPOC, CKNDX, CKND1, MYO, TROPT, TROIQ, TROI, TROPT, TNIPOC, BNP, BNPP, BNPNT     Lab Results   Component Value Date/Time    Creatinine (POC) 0.7 10/07/2010 09:41 AM    Creatinine 0.79 12/07/2019 04:36 AM          No results found for: CPK, RCK1, RCK2, RCK3, RCK4, CKMB, CKNDX, CKND1, TROPT, TROIQ, BNPP, BNP      Lab Results   Component Value Date/Time    WBC 8.8 12/07/2019 04:36 AM    HGB 8.0 (L) 12/07/2019 04:36 AM    HCT 23.5 (L) 12/07/2019 04:36 AM    PLATELET 337 12/07/2019 04:36 AM    MCV 92.5 12/07/2019 04:36 AM         Lab Results   Component Value Date/Time    Sodium 135 (L) 12/07/2019 04:36 AM     Potassium 3.8 12/07/2019 04:36 AM    Chloride 104 12/07/2019 04:36 AM    CO2 26 12/07/2019 04:36 AM    Anion gap 5 12/07/2019 04:36 AM    Glucose 119 (H) 12/07/2019 04:36 AM    BUN 15 12/07/2019 04:36 AM    Creatinine 0.79 12/07/2019 04:36 AM    BUN/Creatinine ratio 19 12/07/2019 04:36 AM    GFR est AA >60 12/07/2019 04:36 AM    GFR est non-AA >60 12/07/2019 04:36 AM    Calcium 9.3  12/07/2019 04:36 AM         No results found for: BNP, BNPP, BNPPPOC, XBNPT, BNPNT      Lab Results   Component Value Date/Time    Cholesterol, total 219 (H) 10/30/2018 10:27 AM    HDL Cholesterol 63 10/30/2018 10:27 AM    LDL, calculated 130 (H) 10/30/2018 10:27 AM    VLDL, calculated 26 10/30/2018 10:27 AM    Triglyceride 131 10/30/2018 10:27 AM    CHOL/HDL Ratio 3.4 12/26/2007 09:12 AM         Lab Results   Component Value Date/Time    ALT (SGPT) 23 11/23/2019 01:32 PM    Alk. phosphatase 150 (H) 11/23/2019 01:32 PM    Bilirubin, total 0.2 11/23/2019 01:32 PM

## 2019-12-07 NOTE — Progress Notes (Signed)
Bedside and Verbal shift change report given to Morgan, RN (oncoming nurse) by Macy, RN (offgoing nurse). Report included the following information SBAR.

## 2019-12-07 NOTE — Progress Notes (Signed)
Progress Notes by [name], MD at 12/07/19 1124                Author: [name], MD  Service: Hospitalist  Author Type: Physician       Filed: 12/07/19 1125  Date of Service: 12/07/19 1124  Status: Signed          Editor: [name], MD (Physician)                    [facility]                                                                                              Hospitalist Progress Note   [name], MD   Answering service: [phone] OR [phone] from in house phone            Date of Service:  12/07/2019   NAME:  Lismarie Fielding   DOB:  [birth year]   MRN:  [id number]           Admission Summary:        From H&P 11/23/2019:   "Jameisha Hollyfield??is a 69 y.o.??female??with past medical history of diabetes, insulin-dependent, GERD and hypertension comes for return of left foot pain and swelling.      Interval history / Subjective:          F/U for left foot cellulitis, left great toe gangrene, DMII, HTN, GERD, Hypokalemia, left popliteal occlusion s/p left fem-pop bypass   S/P TMA POD 2 doing well no pain   Possible discharge next week to rehab          Assessment & Plan:          Left great toe Osteomyelitis    - MRI LLE: 1. First distal phalanx osteomyelitis.   - IV antibiotics   - Vascular surgery following- Patient to follow up in 2 weeks post- op for staple removal.    - Consult ID for antibiotic recommendations    - Podiatry following -TMA POD 2   ??   LLE pain:??dry gangrene??of??left 2nd toe cellulitic changes/Left popliteal occlusion   -s/p left fem-pop bypass (03/11)   -CT LLE (03/06) NL   -Elevated Sed rate and CRP   -BC (03/06) NGTD    -Pain control   - MRI - 1. First distal phalanx osteomyelitis.. No abscess.s/p amputation   ??   Anemia    Stable, No sign of bleeding noted.    - hgb 7.6 today - Monitor labs and transfuse if hgb <7.0    ??   DMII:??hold metformin while IP   -Cont basal insulin, SSI   -Diabetic diet   -a1c 8.1   - DM following??    ??   HTN   -Controlled   -Cont home meds    ??   GERD   -Cont PPI   ??   Hypokalemia   -potassium 3.2   - Replaced       Code status: Full    DVT prophylaxis: Lovenox       Care Plan discussed with: Patient/Family,  Nurse and Case Manager   Anticipated Disposition: SNF/LTC   Anticipated Discharge: 24 hours to 48 hoursmanor care soon awaiting path           Hospital Problems   Date Reviewed:  05/31/2018                         Codes  Class  Noted  POA              Gangrene [facility])  ICD-10-CM: [id number]   ICD-9-CM: 785.4    11/23/2019  Unknown                               Review of Systems:     A comprehensive review of systems was negative except for that written in the HPI.            Vital Signs:      Last 24hrs VS reviewed since prior progress note. Most recent are:   Visit Vitals      BP  113/70 (BP 1 Location: Left upper arm, BP Patient Position: At rest)     Pulse  97     Temp  98.6 ??F (37 ??C)     Resp  16     Ht  5\' 5"  (1.651 m)     Wt  76.7 kg (169 lb)     SpO2  95%        BMI  28.12 kg/m??              Intake/Output Summary (Last 24 hours) at 12/07/2019 1124   Last data filed at 12/07/2019 0449     Gross per 24 hour        Intake  --        Output  2200 ml        Net  -2200 ml              Physical Examination:                     Constitutional:   No acute distress, cooperative     ENT:   Oral mucosa moist, oropharynx benign.      Resp:   CTA bilaterally.     CV:   Regular rhythm, normal rate,          GI:   Soft, non distended, non tender.bs+         Musculoskeletal:   LLE dressing c/d  staples intact         Neurologic:   Moves all extremities.  AAOx3, CN II-XII reviewed, follows commands       Psych:  Good insight, Not anxious nor agitated.         ??                       Data Review:      Review and/or order of clinical lab test   Review and/or order of tests in the medicine section of CPT           Labs:          Recent Labs            12/07/19   0436  12/06/19   0620     WBC  8.8  11.0     HGB  8.0*  9.6*     HCT  23.5*  28.7*         PLT  337  401*          Recent Labs             12/07/19   0436  12/06/19   0620  12/05/19   0529     NA  135*  133*  136     K  3.8  4.1  3.2*     CL  104  101  104     CO2  26  19*  27     BUN  15  16  14      CREA  0.79  0.89  0.77     GLU  119*  214*  77          CA  9.3  9.8  9.2        No results for input(s): ALT, AP, TBIL, TBILI, TP, ALB, GLOB, GGT, AML, LPSE in the last 72 hours.      No lab exists for component: SGOT, GPT, AMYP, HLPSE   No results for input(s): INR, PTP, APTT, INREXT, INREXT in the last 72 hours.    No results for input(s): FE, TIBC, PSAT, FERR in the last 72 hours.    No results found for: FOL, RBCF    No results for input(s): PH, PCO2, PO2 in the last 72 hours.   No results for input(s): CPK, CKNDX, TROIQ in the last 72 hours.      No lab exists for component: CPKMB     Lab Results         Component  Value  Date/Time            Cholesterol, total  219 (H)  10/30/2018 10:27 AM       HDL Cholesterol  63  10/30/2018 10:27 AM       LDL, calculated  130 (H)  10/30/2018 10:27 AM       Triglyceride  131  10/30/2018 10:27 AM            CHOL/HDL Ratio  3.4  12/26/2007 09:12 AM          Lab Results         Component  Value  Date/Time            Glucose (POC)  106 (H)  12/07/2019 06:16 AM       Glucose (POC)  158 (H)  12/06/2019 09:20 PM       Glucose (POC)  139 (H)  12/06/2019 04:28 PM       Glucose (POC)  142 (H)  12/06/2019 11:39 AM            Glucose (POC)  242 (H)  12/06/2019 06:12 AM        No results found for: COLOR, APPRN, SPGRU, REFSG, PHU, PROTU, GLUCU, KETU, BILU, UROU, NITU, LEUKU, GLUKE, EPSU, BACTU, WBCU, RBCU, CASTS, UCRY           Medications Reviewed:          Current Facility-Administered Medications          Medication  Dose  Route  Frequency           ?  HYDROmorphone (PF) (DILAUDID) injection 0.5 mg   0.5 mg  IntraVENous  Q4H PRN     ?  alteplase (CATHFLO) 1 mg in sterile water (preservative free) 1 mL injection   1 mg  InterCATHeter  PRN     ?  oxyCODONE  IR (ROXICODONE) tablet 10 mg   10 mg  Oral  Q4H PRN     ?  cefepime (MAXIPIME) 2 g in 0.9% sodium chloride (MBP/ADV) 100 mL MBP   2 g  IntraVENous  Q8H     ?  insulin glargine (LANTUS) injection 12 Units   12 Units  SubCUTAneous  QHS     ?  vancomycin (VANCOCIN) 1250 mg in NS 250 ml infusion   1,250 mg  IntraVENous  Q16H     ?  acetaminophen (TYLENOL) tablet 650 mg   650 mg  Oral  Q6H     ?  gabapentin (NEURONTIN) capsule 300 mg   300 mg  Oral  QHS     ?  gabapentin (NEURONTIN) capsule 100 mg   100 mg  Oral  DAILY     ?  Vancomycin- Pharmacy to Dose     Other  Rx Dosing/Monitoring     ?  metroNIDAZOLE (FLAGYL) IVPB premix 500 mg   500 mg  IntraVENous  Q12H     ?  sodium chloride (NS) flush 5-40 mL   5-40 mL  IntraVENous  Q8H     ?  sodium chloride (NS) flush 5-40 mL   5-40 mL  IntraVENous  PRN     ?  sodium phosphate (FLEET'S) enema 1 Enema   1 Enema  Rectal  PRN     ?  oxyCODONE IR (ROXICODONE) tablet 5 mg   5 mg  Oral  Q4H PRN     ?  losartan (COZAAR) tablet 100 mg   100 mg  Oral  DAILY     ?  docusate sodium (COLACE) capsule 100 mg   100 mg  Oral  BID     ?  senna (SENOKOT) tablet 8.6 mg   1 Tab  Oral  DAILY     ?  polyethylene glycol (MIRALAX) packet 17 g   17 g  Oral  DAILY PRN           ?  bisacodyL (DULCOLAX) suppository 10 mg   10 mg  Rectal  DAILY PRN           ?  hydroCHLOROthiazide (HYDRODIURIL) tablet 25 mg   25 mg  Oral  DAILY     ?  sodium chloride (NS) flush 5-40 mL   5-40 mL  IntraVENous  Q8H     ?  sodium chloride (NS) flush 5-40 mL   5-40 mL  IntraVENous  PRN     ?  acetaminophen (TYLENOL) suppository 650 mg   650 mg  Rectal  Q6H PRN     ?  promethazine (PHENERGAN) tablet 12.5 mg   12.5 mg  Oral  Q6H PRN          Or           ?  ondansetron (ZOFRAN) injection 4 mg   4 mg  IntraVENous  Q6H PRN     ?  glucose chewable tablet 16 g   4 Tab  Oral  PRN     ?  dextrose (D50W) injection syrg 12.5-25 g   25-50 mL  IntraVENous  PRN     ?  glucagon (GLUCAGEN) injection 1 mg   1 mg  IntraMUSCular  PRN     ?   insulin lispro (HUMALOG) injection     SubCUTAneous  AC&HS     ?  aspirin delayed-release tablet 81 mg   81 mg  Oral  DAILY     ?  pantoprazole (PROTONIX) tablet 40 mg   40 mg  Oral  ACB     ?  amLODIPine (NORVASC) tablet 10 mg   10 mg  Oral  DAILY           ?  [Held by provider] enoxaparin (LOVENOX) injection 40 mg   40 mg  SubCUTAneous  Q24H        ______________________________________________________________________   EXPECTED LENGTH OF STAY: 4d 2h   ACTUAL LENGTH OF STAY:          [place], MD

## 2019-12-07 NOTE — Progress Notes (Addendum)
[facility]               Division of Cardiology  [phone]                       Progress note              Telford Nab, MD  NAME:  Lindsey Huynh   DOB:   1951/09/14   MRN:   [id number]         HPI   very pleasant 69 yo female with h/o HTN, DM and PAD   She is now post op day #7 from left leg bypass and doing well and s/p transmetatarsal left foot  amputation postop day#1    She is doing well cardiac wise.  She denies any chest pain or shortness of breath.  Her only complaint is discomfort and pain at the site of transmetatarsal amputation of her left foot.      Assessment/Plan: 1.  Postop evaluation.  Patient seen preop.  She is doing well from the cardiac standpoint after transmetatarsal amputation of the left foot.  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  She has had no chest pain in or shortness of breath.    Echocardiogram done still pending.  No additional cardiac interventions at this point.  We will follow up on the echocardiogram and unless abnormalities are noted no additional cardiac intervention will be needed for now.    Would recommend outpatient ischemic evaluation given her risk factors and the presence of peripheral arterial disease as well.    2.  PAD: Status post femoropopliteal bypass on November 28, 2019 and doing well.  Closely followed vascular surgery.    3.  Hypertension: She remains hypertensive.  Cont to adjust meds    4. Echo with possible calcified vegetation on aortic valve- will order bcx x 3 in setting of osteomyelitis.  no wbc elevation, fever, etc. On broad spectrum antibiotics.            CARDIAC STUDIES                      @LASTEKG       IMAGING      CT Results (most recent):  Results from Hospital Encounter encounter on 11/23/19   CT LOW EXT LT W CONT    Narrative EXAM: CT LOW EXT LT W CONT    INDICATION: Pain. Great toe gangrene     COMPARISON: Radiographs 11/23/2019    CONTRAST: 100 mL of  Isovue-300.    TECHNIQUE: Helical CT of the left foot during uneventful rapid bolus intravenous  contrast administration. Coronal and Sagittal reformats were generated. Images  reviewed in soft tissue and bone windows. CT dose reduction was achieved through  the use of a standardized protocol tailored for this examination and automatic  exposure control for dose modulation.    FINDINGS: There is soft tissue swelling demonstrated in the forefoot which is  somewhat greater along the medial and dorsal aspect of the distal hallux. In the  remainder of the forefoot, subcutaneous swelling predominates medially dorsally.  There is substantial irregularity of the nail of the hallux and underlying  nailbed though no evidence for soft tissue gas or soft tissue collection. There  is no demonstration of substantial joint effusion nor CT imaging evidence for  osteomyelitis. 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 Bone  mineralization is perhaps mildly diminished.      Impression No CT imaging evidence for soft tissue gas, abscess or osteomyelitis.       XR Results (most recent):  Results from Hospital Encounter encounter on 11/23/19   XR FOOT LT AP/LAT    Narrative EXAM: XR FOOT LT AP/LAT    INDICATION: post op xray s/p transmetatarsal amputation.    COMPARISON: None.    FINDINGS: Two views of the left foot demonstrate transmetatarsal amputation. No  evidence of immediate complication.      Impression 1. Transmetatarsal amputation.        MRI Results (most recent):          Past Medical History:   Diagnosis Date   ??? Diabetes ([place])     insulin + metformin   ??? GERD (gastroesophageal reflux disease)    ??? Hypercholesterolemia    ??? Hypertension            Past Surgical History:   Procedure Laterality Date   ??? HX COLONOSCOPY     ??? HX ORTHOPAEDIC  06-30-10    back surgery (tumor removed)   ??? 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         Wt Readings from Last 3 Encounters:   12/05/19 169 lb (76.7 kg)   04/12/19 180 lb (81.6 kg)   10/30/18 168 lb 9.6 oz (76.5 kg)         Review of Systems:   Pertinent items are noted in the History of Present Illness.         03/19 1901 - 03/20 0700  In: -   Out: 1300 [Urine:1300]    03/18 0701 - 03/19 1900  In: 1050 [P.O.:500; I.V.:550]  Out: 910 [Urine:900]      Telemetry: normal sinus rhythm    Physical Exam:    Neck: no JVD  Heart: regular rate and rhythm  Lungs: clear to auscultation bilaterally  Abdomen: soft, non-tender. Bowel sounds normal. No masses,  no organomegaly  Extremities: no edema    Current Facility-Administered Medications   Medication Dose Route Frequency   ??? HYDROmorphone (PF) (DILAUDID) injection 0.5 mg  0.5 mg IntraVENous Q4H PRN   ??? alteplase (CATHFLO) 1 mg in sterile water (preservative free) 1 mL injection  1 mg InterCATHeter PRN   ??? Vanc trough due 3/20 @ 09:00   Other ONCE   ??? oxyCODONE IR (ROXICODONE) tablet 10 mg  10 mg Oral Q4H PRN   ??? cefepime (MAXIPIME) 2 g in 0.9% sodium chloride (MBP/ADV) 100 mL MBP  2 g IntraVENous Q8H   ??? insulin glargine (LANTUS) injection 12 Units  12 Units SubCUTAneous QHS   ??? vancomycin (VANCOCIN) 1250 mg in NS 250 ml infusion  1,250 mg IntraVENous Q16H   ??? acetaminophen (TYLENOL) tablet 650 mg  650 mg Oral Q6H   ??? gabapentin (NEURONTIN) capsule 300 mg  300 mg Oral QHS   ??? gabapentin (NEURONTIN) capsule 100 mg  100 mg Oral DAILY   ??? Vancomycin- Pharmacy to Dose  Other Rx Dosing/Monitoring   ??? metroNIDAZOLE (FLAGYL) IVPB premix 500 mg  500 mg IntraVENous Q12H   ??? sodium chloride (NS) flush 5-40 mL  5-40 mL IntraVENous Q8H   ??? sodium chloride (NS) flush 5-40 mL  5-40 mL IntraVENous PRN   ??? sodium phosphate (FLEET'S) enema 1 Enema  1 Enema Rectal PRN   ??? oxyCODONE IR (ROXICODONE) tablet 5 mg  5 mg Oral Q4H PRN    ??? losartan (COZAAR) tablet 100 mg  100 mg Oral DAILY   ??? docusate sodium (COLACE) capsule 100 mg  100 mg Oral BID   ??? senna (SENOKOT) tablet 8.6 mg  1 Tab Oral DAILY   ??? polyethylene glycol (MIRALAX) packet 17 g  17 g Oral DAILY PRN   ??? bisacodyL (DULCOLAX) suppository 10 mg  10 mg Rectal DAILY PRN   ??? hydroCHLOROthiazide (HYDRODIURIL) tablet 25 mg  25 mg Oral DAILY   ??? sodium chloride (NS) flush 5-40 mL  5-40 mL IntraVENous Q8H   ??? sodium chloride (NS) flush 5-40 mL  5-40 mL IntraVENous PRN   ??? acetaminophen (TYLENOL) suppository 650 mg  650 mg Rectal Q6H PRN   ??? promethazine (PHENERGAN) tablet 12.5 mg  12.5 mg Oral Q6H PRN    Or   ??? ondansetron (ZOFRAN) injection 4 mg  4 mg IntraVENous Q6H PRN   ??? glucose chewable tablet 16 g  4 Tab Oral PRN   ??? dextrose (D50W) injection syrg 12.5-25 g  25-50 mL IntraVENous PRN   ??? glucagon (GLUCAGEN) injection 1 mg  1 mg IntraMUSCular PRN   ??? insulin lispro (HUMALOG) injection   SubCUTAneous AC&HS   ??? aspirin delayed-release tablet 81 mg  81 mg Oral DAILY   ??? pantoprazole (PROTONIX) tablet 40 mg  40 mg Oral ACB   ??? amLODIPine (NORVASC) tablet 10 mg  10 mg Oral DAILY   ??? [Held by provider] enoxaparin (LOVENOX) injection 40 mg  40 mg SubCUTAneous Q24H         All Cardiac Markers in the last 24 hours:  No results found for: CPK, CK, CKMMB, CKMB, RCK3, CKMBT, CKMBPOC, CKNDX, CKND1, MYO, TROPT, TROIQ, TROI, TROPT, TNIPOC, BNP, BNPP, BNPNT     Lab Results   Component Value Date/Time    Creatinine (POC) 0.7 10/07/2010 09:41 AM    Creatinine 0.79 12/07/2019 04:36 AM          No results found for: CPK, RCK1, RCK2, RCK3, RCK4, CKMB, CKNDX, CKND1, TROPT, TROIQ, BNPP, BNP      Lab Results   Component Value Date/Time    WBC 8.8 12/07/2019 04:36 AM    HGB 8.0 (L) 12/07/2019 04:36 AM    HCT 23.5 (L) 12/07/2019 04:36 AM    PLATELET 337 12/07/2019 04:36 AM    MCV 92.5 12/07/2019 04:36 AM         Lab Results   Component Value Date/Time    Sodium 135 (L) 12/07/2019 04:36 AM    Potassium 3.8  12/07/2019 04:36 AM    Chloride 104 12/07/2019 04:36 AM    CO2 26 12/07/2019 04:36 AM    Anion gap 5 12/07/2019 04:36 AM    Glucose 119 (H) 12/07/2019 04:36 AM    BUN 15 12/07/2019 04:36 AM    Creatinine 0.79 12/07/2019 04:36 AM    BUN/Creatinine ratio 19 12/07/2019 04:36 AM    GFR est AA >60 12/07/2019 04:36 AM    GFR est non-AA >60 12/07/2019 04:36 AM    Calcium 9.3  12/07/2019 04:36 AM         No results found for: BNP, BNPP, BNPPPOC, XBNPT, BNPNT      Lab Results   Component Value Date/Time    Cholesterol, total 219 (H) 10/30/2018 10:27 AM    HDL Cholesterol 63 10/30/2018 10:27 AM    LDL, calculated 130 (H) 10/30/2018 10:27 AM    VLDL, calculated 26 10/30/2018 10:27 AM    Triglyceride 131 10/30/2018 10:27 AM    CHOL/HDL Ratio 3.4 12/26/2007 09:12 AM         Lab Results   Component Value Date/Time    ALT (SGPT) 23 11/23/2019 01:32 PM    Alk. phosphatase 150 (H) 11/23/2019 01:32 PM    Bilirubin, total 0.2 11/23/2019 01:32 PM

## 2019-12-08 LAB — GLUCOSE, POC
Glucose (POC): 113 mg/dL — ABNORMAL HIGH (ref 65–100)
Glucose (POC): 131 mg/dL — ABNORMAL HIGH (ref 65–100)
Glucose (POC): 179 mg/dL — ABNORMAL HIGH (ref 65–100)
Glucose (POC): 229 mg/dL — ABNORMAL HIGH (ref 65–100)

## 2019-12-08 LAB — CBC WITH AUTOMATED DIFF
ABS. BASOPHILS: 0.1 10*3/uL (ref 0.0–0.1)
ABS. EOSINOPHILS: 0.9 10*3/uL — ABNORMAL HIGH (ref 0.0–0.4)
ABS. IMM. GRANS.: 0.1 10*3/uL — ABNORMAL HIGH (ref 0.00–0.04)
ABS. LYMPHOCYTES: 0.7 10*3/uL — ABNORMAL LOW (ref 0.8–3.5)
ABS. MONOCYTES: 0.6 10*3/uL (ref 0.0–1.0)
ABS. NEUTROPHILS: 5.1 10*3/uL (ref 1.8–8.0)
ABSOLUTE NRBC: 0 10*3/uL (ref 0.00–0.01)
BASOPHILS: 1 % (ref 0–1)
EOSINOPHILS: 12 % — ABNORMAL HIGH (ref 0–7)
HCT: 21.3 % — ABNORMAL LOW (ref 35.0–47.0)
HGB: 7.2 g/dL — ABNORMAL LOW (ref 11.5–16.0)
IMMATURE GRANULOCYTES: 1 % — ABNORMAL HIGH (ref 0.0–0.5)
LYMPHOCYTES: 9 % — ABNORMAL LOW (ref 12–49)
MCH: 30.4 PG (ref 26.0–34.0)
MCHC: 33.8 g/dL (ref 30.0–36.5)
MCV: 89.9 FL (ref 80.0–99.0)
MONOCYTES: 8 % (ref 5–13)
MPV: 9.6 FL (ref 8.9–12.9)
NEUTROPHILS: 69 % (ref 32–75)
NRBC: 0 PER 100 WBC
PLATELET: 312 10*3/uL (ref 150–400)
RBC: 2.37 M/uL — ABNORMAL LOW (ref 3.80–5.20)
RDW: 13.2 % (ref 11.5–14.5)
WBC: 7.5 10*3/uL (ref 3.6–11.0)

## 2019-12-08 LAB — METABOLIC PANEL, BASIC
Anion gap: 6 mmol/L (ref 5–15)
BUN/Creatinine ratio: 18 (ref 12–20)
BUN: 15 MG/DL (ref 6–20)
CO2: 28 mmol/L (ref 21–32)
Calcium: 9 MG/DL (ref 8.5–10.1)
Chloride: 102 mmol/L (ref 97–108)
Creatinine: 0.84 MG/DL (ref 0.55–1.02)
GFR est AA: >60 mL/min/{1.73_m2} (ref >60)
GFR est non-AA: >60 mL/min/{1.73_m2} (ref >60)
Glucose: 123 mg/dL — ABNORMAL HIGH (ref 65–100)
Potassium: 3.2 mmol/L — ABNORMAL LOW (ref 3.5–5.1)
Sodium: 136 mmol/L (ref 136–145)

## 2019-12-08 LAB — CULTURE, ANAEROBIC

## 2019-12-08 LAB — CULTURE, TISSUE W GRAM STAIN

## 2019-12-08 LAB — CBC WITH AUTO DIFFERENTIAL
Basophils %: 1 % (ref 0–1)
Basophils Absolute: 0.1 10*3/uL (ref 0.0–0.1)
Eosinophils %: 12 % — ABNORMAL HIGH (ref 0–7)
Eosinophils Absolute: 0.9 10*3/uL — ABNORMAL HIGH (ref 0.0–0.4)
Granulocyte Absolute Count: 0.1 10*3/uL — ABNORMAL HIGH (ref 0.00–0.04)
Hematocrit: 21.3 % — ABNORMAL LOW (ref 35.0–47.0)
Hemoglobin: 7.2 g/dL — ABNORMAL LOW (ref 11.5–16.0)
Immature Granulocytes %: 1 % — ABNORMAL HIGH (ref 0.0–0.5)
Lymphocytes %: 9 % — ABNORMAL LOW (ref 12–49)
Lymphocytes Absolute: 0.7 10*3/uL — ABNORMAL LOW (ref 0.8–3.5)
MCH: 30.4 PG (ref 26.0–34.0)
MCHC: 33.8 g/dL (ref 30.0–36.5)
MCV: 89.9 FL (ref 80.0–99.0)
MPV: 9.6 FL (ref 8.9–12.9)
Monocytes %: 8 % (ref 5–13)
Monocytes Absolute: 0.6 10*3/uL (ref 0.0–1.0)
NRBC Absolute: 0 10*3/uL (ref 0.00–0.01)
Neutrophils %: 69 % (ref 32–75)
Neutrophils Absolute: 5.1 10*3/uL (ref 1.8–8.0)
Nucleated RBCs: 0 PER 100 WBC
Platelets: 312 10*3/uL (ref 150–400)
RBC: 2.37 M/uL — ABNORMAL LOW (ref 3.80–5.20)
RDW: 13.2 % (ref 11.5–14.5)
WBC: 7.5 10*3/uL (ref 3.6–11.0)

## 2019-12-08 LAB — POCT GLUCOSE
POC Glucose: 113 mg/dL — ABNORMAL HIGH (ref 65–100)
POC Glucose: 131 mg/dL — ABNORMAL HIGH (ref 65–100)
POC Glucose: 179 mg/dL — ABNORMAL HIGH (ref 65–100)
POC Glucose: 229 mg/dL — ABNORMAL HIGH (ref 65–100)

## 2019-12-08 LAB — BASIC METABOLIC PANEL
Anion Gap: 6 mmol/L (ref 5–15)
BUN/Creatinine Ratio: 18 (ref 12–20)
BUN: 15 MG/DL (ref 6–20)
CO2: 28 mmol/L (ref 21–32)
Calcium: 9 MG/DL (ref 8.5–10.1)
Chloride: 102 mmol/L (ref 97–108)
Creatinine: 0.84 MG/DL (ref 0.55–1.02)
GFR African American: >60 mL/min/{1.73_m2} (ref >60)
Glucose: 123 mg/dL — ABNORMAL HIGH (ref 65–100)
Potassium: 3.2 mmol/L — ABNORMAL LOW (ref 3.5–5.1)
Sodium: 136 mmol/L (ref 136–145)
eGFR NON-AA: >60 mL/min/{1.73_m2} (ref >60)

## 2019-12-08 MED ORDER — LACTULOSE 10 GRAM/15 ML ORAL SOLUTION
10 gram/15 mL | Freq: Two times a day (BID) | ORAL | Status: DC
Start: 2019-12-08 — End: 2019-12-18
  Administered 2019-12-08 – 2019-12-15 (×6): via ORAL

## 2019-12-08 MED FILL — LOSARTAN 50 MG TAB: 50 mg | ORAL | Qty: 2

## 2019-12-08 MED FILL — OXYCODONE 5 MG TAB: 5 mg | ORAL | Qty: 2

## 2019-12-08 MED FILL — AMLODIPINE 5 MG TAB: 5 mg | ORAL | Qty: 2

## 2019-12-08 MED FILL — CEFEPIME 2 GRAM SOLUTION FOR INJECTION: 2 gram | INTRAMUSCULAR | Qty: 2

## 2019-12-08 MED FILL — ACETAMINOPHEN 325 MG TABLET: 325 mg | ORAL | Qty: 2

## 2019-12-08 MED FILL — METRO I.V. 500 MG/100 ML INTRAVENOUS PIGGYBACK: 500 mg/100 mL | INTRAVENOUS | Qty: 100

## 2019-12-08 MED FILL — INSULIN LISPRO 100 UNIT/ML INJECTION: 100 unit/mL | SUBCUTANEOUS | Qty: 1

## 2019-12-08 MED FILL — ASPIRIN 81 MG TAB, DELAYED RELEASE: 81 mg | ORAL | Qty: 1

## 2019-12-08 MED FILL — DOK 100 MG CAPSULE: 100 mg | ORAL | Qty: 1

## 2019-12-08 MED FILL — VANCOMYCIN IN 0.9 % SODIUM CHLORIDE 1.25 GRAM/250 ML IV: 1.25 gram/250 mL | INTRAVENOUS | Qty: 250

## 2019-12-08 MED FILL — LACTULOSE 20 GRAM/30 ML ORAL SOLUTION: 20 gram/30 mL | ORAL | Qty: 30

## 2019-12-08 MED FILL — HYDROCHLOROTHIAZIDE 25 MG TAB: 25 mg | ORAL | Qty: 1

## 2019-12-08 MED FILL — PANTOPRAZOLE 40 MG TAB, DELAYED RELEASE: 40 mg | ORAL | Qty: 1

## 2019-12-08 MED FILL — HYDROMORPHONE (PF) 1 MG/ML IJ SOLN: 1 mg/mL | INTRAMUSCULAR | Qty: 1

## 2019-12-08 MED FILL — ENOXAPARIN 40 MG/0.4 ML SUB-Q SYRINGE: 40 mg/0.4 mL | SUBCUTANEOUS | Qty: 0.4

## 2019-12-08 MED FILL — ONDANSETRON (PF) 4 MG/2 ML INJECTION: 4 mg/2 mL | INTRAMUSCULAR | Qty: 2

## 2019-12-08 MED FILL — GABAPENTIN 300 MG CAP: 300 mg | ORAL | Qty: 1

## 2019-12-08 MED FILL — INSULIN GLARGINE 100 UNIT/ML INJECTION: 100 unit/mL | SUBCUTANEOUS | Qty: 1

## 2019-12-08 MED FILL — GABAPENTIN 100 MG CAP: 100 mg | ORAL | Qty: 1

## 2019-12-08 MED FILL — SENNA LAX 8.6 MG TABLET: 8.6 mg | ORAL | Qty: 1

## 2019-12-08 NOTE — Progress Notes (Signed)
Bedside and Verbal shift change report given to Olivia Mackie, Therapist, sports (oncoming nurse) by Lilia Pro, RN (offgoing nurse). Report included the following information SBAR.

## 2019-12-08 NOTE — Progress Notes (Signed)
Bedside and Verbal shift change report given to Morgan, RN (oncoming nurse) by Macy, RN (offgoing nurse). Report included the following information SBAR.

## 2019-12-08 NOTE — Progress Notes (Signed)
[facility]               Division of Cardiology  [phone]                       Progress note              Telford Nab, MD  NAME:  Lindsey Huynh   DOB:   03/21/1951   MRN:   [id number]         HPI   very pleasant 69 yo female with h/o HTN, DM and PAD   She is now post op day #7 from left leg bypass and doing well and s/p transmetatarsal left foot  amputation postop day#1    She is doing well cardiac wise.  She denies any chest pain or shortness of breath.  Her only complaint is discomfort and pain at the site of transmetatarsal amputation of her left foot.      Assessment/Plan: 1.  Postop evaluation.  Patient seen preop.  She is doing well from the cardiac standpoint after transmetatarsal amputation of the left foot.  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  She has had no chest pain in or shortness of breath.    Echocardiogram done still pending.  No additional cardiac interventions at this point.      Would recommend outpatient ischemic evaluation given her risk factors and the presence of peripheral arterial disease as well.    2.  PAD: Status post femoropopliteal bypass on November 28, 2019 and doing well.  Closely followed vascular surgery.    3.  Hypertension: She remains hypertensive.  Cont to adjust meds    4. Echo with possible calcified vegetation on aortic valve- responding to broad spectrum antibiotics. For now TEE will not provide additional information. Valve function is preserved. Will need surveillance cx after antibiotics stopped. The vegetation looks old, calcified. FU TTE in 4-6 weeks.            CARDIAC STUDIES                      @LASTEKG       IMAGING      CT Results (most recent):  Results from Hospital Encounter encounter on 11/23/19   CT LOW EXT LT W CONT    Narrative EXAM: CT LOW EXT LT W CONT    INDICATION: Pain. Great toe gangrene     COMPARISON: Radiographs 11/23/2019    CONTRAST: 100 mL of Isovue-300.     TECHNIQUE: Helical CT of the left foot during uneventful rapid bolus intravenous  contrast administration. Coronal and Sagittal reformats were generated. Images  reviewed in soft tissue and bone windows. CT dose reduction was achieved through  the use of a standardized protocol tailored for this examination and automatic  exposure control for dose modulation.    FINDINGS: There is soft tissue swelling demonstrated in the forefoot which is  somewhat greater along the medial and dorsal aspect of the distal hallux. In the  remainder of the forefoot, subcutaneous swelling predominates medially dorsally.  There is substantial irregularity of the nail of the hallux and underlying  nailbed though no evidence for soft tissue gas or soft tissue collection. There  is no demonstration of substantial joint effusion nor CT imaging evidence for  osteomyelitis. 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 Bone  mineralization is perhaps mildly diminished.      Impression No CT imaging evidence for soft tissue gas, abscess or osteomyelitis.       XR Results (most recent):  Results from Hospital Encounter encounter on 11/23/19   XR FOOT LT AP/LAT    Narrative EXAM: XR FOOT LT AP/LAT    INDICATION: post op xray s/p transmetatarsal amputation.    COMPARISON: None.    FINDINGS: Two views of the left foot demonstrate transmetatarsal amputation. No  evidence of immediate complication.      Impression 1. Transmetatarsal amputation.        MRI Results (most recent):          Past Medical History:   Diagnosis Date   ??? Diabetes ([place])     insulin + metformin   ??? GERD (gastroesophageal reflux disease)    ??? Hypercholesterolemia    ??? Hypertension            Past Surgical History:   Procedure Laterality Date   ??? HX COLONOSCOPY     ??? HX ORTHOPAEDIC  06-30-10    back surgery (tumor removed)   ??? 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         Wt Readings from Last 3 Encounters:   12/05/19 169 lb (76.7 kg)   04/12/19 180 lb (81.6 kg)   10/30/18 168 lb 9.6 oz (76.5 kg)         Review of Systems:   Pertinent items are noted in the History of Present Illness.         No intake/output data recorded.    03/19 1901 - 03/21 0700  In: -   Out: 2800 [Urine:2800]      Telemetry: normal sinus rhythm    Physical Exam:    Neck: no JVD  Heart: regular rate and rhythm  Lungs: clear to auscultation bilaterally  Abdomen: soft, non-tender. Bowel sounds normal. No masses,  no organomegaly  Extremities: no edema    Current Facility-Administered Medications   Medication Dose Route Frequency   ??? lactulose (CHRONULAC) 10 gram/15 mL solution 30 mL  20 g Oral BID   ??? HYDROmorphone (PF) (DILAUDID) injection 0.5 mg  0.5 mg IntraVENous Q4H PRN   ??? alteplase (CATHFLO) 1 mg in sterile water (preservative free) 1 mL injection  1 mg InterCATHeter PRN   ??? oxyCODONE IR (ROXICODONE) tablet 10 mg  10 mg Oral Q4H PRN   ??? cefepime (MAXIPIME) 2 g in 0.9% sodium chloride (MBP/ADV) 100 mL MBP  2 g IntraVENous Q8H   ??? insulin glargine (LANTUS) injection 12 Units  12 Units SubCUTAneous QHS   ??? vancomycin (VANCOCIN) 1250 mg in NS 250 ml infusion  1,250 mg IntraVENous Q16H   ??? acetaminophen (TYLENOL) tablet 650 mg  650 mg Oral Q6H   ??? gabapentin (NEURONTIN) capsule 300 mg  300 mg Oral QHS   ??? gabapentin (NEURONTIN) capsule 100 mg  100 mg Oral DAILY   ??? Vancomycin- Pharmacy to Dose   Other  Rx Dosing/Monitoring   ??? metroNIDAZOLE (FLAGYL) IVPB premix 500 mg  500 mg IntraVENous Q12H   ??? sodium chloride (NS) flush 5-40 mL  5-40 mL IntraVENous Q8H   ??? sodium chloride (NS) flush 5-40 mL  5-40 mL IntraVENous PRN   ??? sodium phosphate (FLEET'S) enema 1 Enema  1 Enema Rectal PRN   ??? oxyCODONE IR (ROXICODONE) tablet 5  mg  5 mg Oral Q4H PRN   ??? losartan (COZAAR) tablet 100 mg  100 mg Oral DAILY   ??? docusate sodium (COLACE) capsule 100 mg  100 mg Oral BID   ??? senna (SENOKOT) tablet 8.6 mg  1 Tab Oral DAILY   ??? polyethylene glycol (MIRALAX) packet 17 g  17 g Oral DAILY PRN   ??? bisacodyL (DULCOLAX) suppository 10 mg  10 mg Rectal DAILY PRN   ??? hydroCHLOROthiazide (HYDRODIURIL) tablet 25 mg  25 mg Oral DAILY   ??? sodium chloride (NS) flush 5-40 mL  5-40 mL IntraVENous Q8H   ??? sodium chloride (NS) flush 5-40 mL  5-40 mL IntraVENous PRN   ??? acetaminophen (TYLENOL) suppository 650 mg  650 mg Rectal Q6H PRN   ??? promethazine (PHENERGAN) tablet 12.5 mg  12.5 mg Oral Q6H PRN    Or   ??? ondansetron (ZOFRAN) injection 4 mg  4 mg IntraVENous Q6H PRN   ??? glucose chewable tablet 16 g  4 Tab Oral PRN   ??? dextrose (D50W) injection syrg 12.5-25 g  25-50 mL IntraVENous PRN   ??? glucagon (GLUCAGEN) injection 1 mg  1 mg IntraMUSCular PRN   ??? insulin lispro (HUMALOG) injection   SubCUTAneous AC&HS   ??? aspirin delayed-release tablet 81 mg  81 mg Oral DAILY   ??? pantoprazole (PROTONIX) tablet 40 mg  40 mg Oral ACB   ??? amLODIPine (NORVASC) tablet 10 mg  10 mg Oral DAILY   ??? [Held by provider] enoxaparin (LOVENOX) injection 40 mg  40 mg SubCUTAneous Q24H         All Cardiac Markers in the last 24 hours:  No results found for: CPK, CK, CKMMB, CKMB, RCK3, CKMBT, CKMBPOC, CKNDX, CKND1, MYO, TROPT, TROIQ, TROI, TROPT, TNIPOC, BNP, BNPP, BNPNT     Lab Results   Component Value Date/Time    Creatinine (POC) 0.7 10/07/2010 09:41 AM    Creatinine 0.84 12/08/2019 01:18 AM          No results found for: CPK, RCK1, RCK2, RCK3, RCK4, CKMB, CKNDX, CKND1, TROPT, TROIQ, BNPP, BNP      Lab Results   Component Value Date/Time    WBC 7.5 12/08/2019 01:18 AM    HGB 7.2 (L) 12/08/2019 01:18 AM    HCT 21.3 (L) 12/08/2019 01:18 AM    PLATELET 312 12/08/2019 01:18 AM    MCV 89.9 12/08/2019 01:18 AM         Lab Results   Component Value Date/Time    Sodium 136 12/08/2019 01:18 AM     Potassium 3.2 (L) 12/08/2019 01:18 AM    Chloride 102 12/08/2019 01:18 AM    CO2 28 12/08/2019 01:18 AM    Anion gap 6 12/08/2019 01:18 AM    Glucose 123 (H) 12/08/2019 01:18 AM    BUN 15 12/08/2019 01:18 AM    Creatinine 0.84 12/08/2019 01:18 AM    BUN/Creatinine ratio 18 12/08/2019 01:18 AM    GFR est AA >60 12/08/2019 01:18 AM    GFR est non-AA >60 12/08/2019 01:18 AM    Calcium 9.0 12/08/2019  01:18 AM         No results found for: BNP, BNPP, BNPPPOC, XBNPT, BNPNT      Lab Results   Component Value Date/Time    Cholesterol, total 219 (H) 10/30/2018 10:27 AM    HDL Cholesterol 63 10/30/2018 10:27 AM    LDL, calculated 130 (H) 10/30/2018 10:27 AM    VLDL, calculated 26 10/30/2018 10:27 AM    Triglyceride 131 10/30/2018 10:27 AM    CHOL/HDL Ratio 3.4 12/26/2007 09:12 AM         Lab Results   Component Value Date/Time    ALT (SGPT) 23 11/23/2019 01:32 PM    Alk. phosphatase 150 (H) 11/23/2019 01:32 PM    Bilirubin, total 0.2 11/23/2019 01:32 PM

## 2019-12-08 NOTE — Progress Notes (Signed)
Bedside and Verbal shift change report given to French Ana, Charity fundraiser (oncoming nurse) by Lequita Halt, RN (offgoing nurse). Report included the following information SBAR.

## 2019-12-08 NOTE — Progress Notes (Signed)
[facility]               Division of Cardiology  [phone]                       Progress note              Telford Nab, MD  NAME:  Lindsey Huynh   DOB:   November 28, 1950   MRN:   [id number]         HPI   very pleasant 69 yo female with h/o HTN, DM and PAD   She is now post op day #7 from left leg bypass and doing well and s/p transmetatarsal left foot  amputation postop day#1    She is doing well cardiac wise.  She denies any chest pain or shortness of breath.  Her only complaint is discomfort and pain at the site of transmetatarsal amputation of her left foot.      Assessment/Plan: 1.  Postop evaluation.  Patient seen preop.  She is doing well from the cardiac standpoint after transmetatarsal amputation of the left foot.  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  She has had no chest pain in or shortness of breath.    Echocardiogram done still pending.  No additional cardiac interventions at this point.      Would recommend outpatient ischemic evaluation given her risk factors and the presence of peripheral arterial disease as well.    2.  PAD: Status post femoropopliteal bypass on November 28, 2019 and doing well.  Closely followed vascular surgery.    3.  Hypertension: She remains hypertensive.  Cont to adjust meds    4. Echo with possible calcified vegetation on aortic valve- responding to broad spectrum antibiotics. For now TEE will not provide additional information. Valve function is preserved. Will need surveillance cx after antibiotics stopped. The vegetation looks old, calcified. FU TTE in 4-6 weeks.            CARDIAC STUDIES                      @LASTEKG       IMAGING      CT Results (most recent):  Results from Hospital Encounter encounter on 11/23/19   CT LOW EXT LT W CONT    Narrative EXAM: CT LOW EXT LT W CONT    INDICATION: Pain. Great toe gangrene     COMPARISON: Radiographs 11/23/2019    CONTRAST: 100 mL of  Isovue-300.    TECHNIQUE: Helical CT of the left foot during uneventful rapid bolus intravenous  contrast administration. Coronal and Sagittal reformats were generated. Images  reviewed in soft tissue and bone windows. CT dose reduction was achieved through  the use of a standardized protocol tailored for this examination and automatic  exposure control for dose modulation.    FINDINGS: There is soft tissue swelling demonstrated in the forefoot which is  somewhat greater along the medial and dorsal aspect of the distal hallux. In the  remainder of the forefoot, subcutaneous swelling predominates medially dorsally.  There is substantial irregularity of the nail of the hallux and underlying  nailbed though no evidence for soft tissue gas or soft tissue collection. There  is no demonstration of substantial joint effusion nor CT imaging evidence for  osteomyelitis. 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 Bone  mineralization is perhaps mildly diminished.      Impression No CT imaging evidence for soft tissue gas, abscess or osteomyelitis.       XR Results (most recent):  Results from Hospital Encounter encounter on 11/23/19   XR FOOT LT AP/LAT    Narrative EXAM: XR FOOT LT AP/LAT    INDICATION: post op xray s/p transmetatarsal amputation.    COMPARISON: None.    FINDINGS: Two views of the left foot demonstrate transmetatarsal amputation. No  evidence of immediate complication.      Impression 1. Transmetatarsal amputation.        MRI Results (most recent):          Past Medical History:   Diagnosis Date   ??? Diabetes ([place])     insulin + metformin   ??? GERD (gastroesophageal reflux disease)    ??? Hypercholesterolemia    ??? Hypertension            Past Surgical History:   Procedure Laterality Date   ??? HX COLONOSCOPY     ??? HX ORTHOPAEDIC  06-30-10    back surgery (tumor removed)   ??? HX TUMOR REMOVAL  06/30/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                Visit Vitals  BP 116/64 (BP 1 Location: Left upper arm, BP Patient Position: At rest)   Pulse 77   Temp 98.8 ??F (37.1 ??C)   Resp 14   Ht 5' 5"  (1.651 m)   Wt 169 lb (76.7 kg)   SpO2 96%   BMI 28.12 kg/m??         Wt Readings from Last 3 Encounters:   12/05/19 169 lb (76.7 kg)   04/12/19 180 lb (81.6 kg)   10/30/18 168 lb 9.6 oz (76.5 kg)         Review of Systems:   Pertinent items are noted in the History of Present Illness.         No intake/output data recorded.    03/19 1901 - 03/21 0700  In: -   Out: 2800 [Urine:2800]      Telemetry: normal sinus rhythm    Physical Exam:    Neck: no JVD  Heart: regular rate and rhythm  Lungs: clear to auscultation bilaterally  Abdomen: soft, non-tender. Bowel sounds normal. No masses,  no organomegaly  Extremities: no edema    Current Facility-Administered Medications   Medication Dose Route Frequency   ??? lactulose (CHRONULAC) 10 gram/15 mL solution 30 mL  20 g Oral BID   ??? HYDROmorphone (PF) (DILAUDID) injection 0.5 mg  0.5 mg IntraVENous Q4H PRN   ??? alteplase (CATHFLO) 1 mg in sterile water (preservative free) 1 mL injection  1 mg InterCATHeter PRN   ??? oxyCODONE IR (ROXICODONE) tablet 10 mg  10 mg Oral Q4H PRN   ??? cefepime (MAXIPIME) 2 g in 0.9% sodium chloride (MBP/ADV) 100 mL MBP  2 g IntraVENous Q8H   ??? insulin glargine (LANTUS) injection 12 Units  12 Units SubCUTAneous QHS   ??? vancomycin (VANCOCIN) 1250 mg in NS 250 ml infusion  1,250 mg IntraVENous Q16H   ??? acetaminophen (TYLENOL) tablet 650 mg  650 mg Oral Q6H   ??? gabapentin (NEURONTIN) capsule 300 mg  300 mg Oral QHS   ??? gabapentin (NEURONTIN) capsule 100 mg  100 mg Oral DAILY   ??? Vancomycin- Pharmacy to Dose   Other  Rx Dosing/Monitoring   ??? metroNIDAZOLE (FLAGYL) IVPB premix 500 mg  500 mg IntraVENous Q12H   ??? sodium chloride (NS) flush 5-40 mL  5-40 mL IntraVENous Q8H   ??? sodium chloride (NS) flush 5-40 mL  5-40 mL IntraVENous PRN   ??? sodium phosphate (FLEET'S) enema 1 Enema  1 Enema Rectal PRN   ???  oxyCODONE IR (ROXICODONE) tablet 5 mg  5 mg Oral Q4H PRN   ??? losartan (COZAAR) tablet 100 mg  100 mg Oral DAILY   ??? docusate sodium (COLACE) capsule 100 mg  100 mg Oral BID   ??? senna (SENOKOT) tablet 8.6 mg  1 Tab Oral DAILY   ??? polyethylene glycol (MIRALAX) packet 17 g  17 g Oral DAILY PRN   ??? bisacodyL (DULCOLAX) suppository 10 mg  10 mg Rectal DAILY PRN   ??? hydroCHLOROthiazide (HYDRODIURIL) tablet 25 mg  25 mg Oral DAILY   ??? sodium chloride (NS) flush 5-40 mL  5-40 mL IntraVENous Q8H   ??? sodium chloride (NS) flush 5-40 mL  5-40 mL IntraVENous PRN   ??? acetaminophen (TYLENOL) suppository 650 mg  650 mg Rectal Q6H PRN   ??? promethazine (PHENERGAN) tablet 12.5 mg  12.5 mg Oral Q6H PRN    Or   ??? ondansetron (ZOFRAN) injection 4 mg  4 mg IntraVENous Q6H PRN   ??? glucose chewable tablet 16 g  4 Tab Oral PRN   ??? dextrose (D50W) injection syrg 12.5-25 g  25-50 mL IntraVENous PRN   ??? glucagon (GLUCAGEN) injection 1 mg  1 mg IntraMUSCular PRN   ??? insulin lispro (HUMALOG) injection   SubCUTAneous AC&HS   ??? aspirin delayed-release tablet 81 mg  81 mg Oral DAILY   ??? pantoprazole (PROTONIX) tablet 40 mg  40 mg Oral ACB   ??? amLODIPine (NORVASC) tablet 10 mg  10 mg Oral DAILY   ??? [Held by provider] enoxaparin (LOVENOX) injection 40 mg  40 mg SubCUTAneous Q24H         All Cardiac Markers in the last 24 hours:  No results found for: CPK, CK, CKMMB, CKMB, RCK3, CKMBT, CKMBPOC, CKNDX, CKND1, MYO, TROPT, TROIQ, TROI, TROPT, TNIPOC, BNP, BNPP, BNPNT     Lab Results   Component Value Date/Time    Creatinine (POC) 0.7 10/07/2010 09:41 AM    Creatinine 0.84 12/08/2019 01:18 AM          No results found for: CPK, RCK1, RCK2, RCK3, RCK4, CKMB, CKNDX, CKND1, TROPT, TROIQ, BNPP, BNP      Lab Results   Component Value Date/Time    WBC 7.5 12/08/2019 01:18 AM    HGB 7.2 (L) 12/08/2019 01:18 AM    HCT 21.3 (L) 12/08/2019 01:18 AM    PLATELET 312 12/08/2019 01:18 AM    MCV 89.9 12/08/2019 01:18 AM         Lab Results   Component Value Date/Time     Sodium 136 12/08/2019 01:18 AM    Potassium 3.2 (L) 12/08/2019 01:18 AM    Chloride 102 12/08/2019 01:18 AM    CO2 28 12/08/2019 01:18 AM    Anion gap 6 12/08/2019 01:18 AM    Glucose 123 (H) 12/08/2019 01:18 AM    BUN 15 12/08/2019 01:18 AM    Creatinine 0.84 12/08/2019 01:18 AM    BUN/Creatinine ratio 18 12/08/2019 01:18 AM    GFR est AA >60 12/08/2019 01:18 AM    GFR est non-AA >60 12/08/2019 01:18 AM    Calcium 9.0 12/08/2019  01:18 AM         No results found for: BNP, BNPP, BNPPPOC, XBNPT, BNPNT      Lab Results   Component Value Date/Time    Cholesterol, total 219 (H) 10/30/2018 10:27 AM    HDL Cholesterol 63 10/30/2018 10:27 AM    LDL, calculated 130 (H) 10/30/2018 10:27 AM    VLDL, calculated 26 10/30/2018 10:27 AM    Triglyceride 131 10/30/2018 10:27 AM    CHOL/HDL Ratio 3.4 12/26/2007 09:12 AM         Lab Results   Component Value Date/Time    ALT (SGPT) 23 11/23/2019 01:32 PM    Alk. phosphatase 150 (H) 11/23/2019 01:32 PM    Bilirubin, total 0.2 11/23/2019 01:32 PM

## 2019-12-08 NOTE — Progress Notes (Signed)
Progress Notes by [name], MD at 12/08/19 1039                Author: [name], MD  Service: Hospitalist  Author Type: Physician       Filed: 12/08/19 1042  Date of Service: 12/08/19 1039  Status: Signed          Editor: [name], MD (Physician)                    [facility]                                                                                              Hospitalist Progress Note   [name], MD   Answering service: [phone] from in house phone            Date of Service:  12/08/2019   NAME:  Lindsey Huynh   DOB:  01/04/1951   MRN:  [id number]           Admission Summary:        From H&P 11/23/2019:   "Sheyanne Canoy??is a 69 y.o.??female??with past medical history of diabetes, insulin-dependent, GERD and hypertension comes for return of left foot pain and swelling.      Interval history / Subjective:          F/U for left foot cellulitis, left great toe gangrene, DMII, HTN, GERD, Hypokalemia, left popliteal occlusion s/p left fem-pop bypass   S/P TMA POD 3 pt c/o severe constipation no BM x 3 days on NARCOTICS           Assessment & Plan:          Left great toe Osteomyelitis    PAD: Status post femoropopliteal bypass on November 28, 2019      - MRI LLE: 1. First distal phalanx osteomyelitis.   - IV antibiotics PER ID    - Vascular surgery following- Patient to follow up in 2 weeks post- op for staple removal.    - Podiatry following -TMA POD 2      LLE pain:??dry gangrene??of??left 2nd toe cellulitic changes/Left popliteal occlusion   -s/p left fem-pop bypass (03/11)   -CT LLE (03/06) NL   -Elevated Sed rate and CRP   -BC (03/06) NGTD    -Pain control   - MRI - 1. First distal phalanx osteomyelitis.. No abscess.s/p amputation   ??   Anemia    Stable, No sign of bleeding noted. hgb dropped    - hgb 7.2 today - Monitor labs and transfuse if hgb <7.0    ??   DMII:??hold metformin while IP   -Cont basal insulin, SSI   -Diabetic  diet   -a1c 8.1   - DM following??   ??   HTN   -Controlled   -Cont home meds    ??   GERD   -Cont PPI   ??   Hypokalemia   -potassium 3.2 replete PRN      Code status: Full    DVT prophylaxis: Lovenox  Care Plan discussed with: Patient/Family, Nurse and Case Manager   Anticipated Disposition: SNF/LTC   [facility]: [facility]            Hospital Problems   Date Reviewed:  05/31/2018                         Codes  Class  Noted  POA              Gangrene ([place])  ICD-10-CM: [id number]   ICD-9-CM: 785.4    11/23/2019  Unknown                               Review of Systems:     A comprehensive review of systems was negative except for that written in the HPI.       Xr Foot Lt Ap/lat      Result Date: 12/05/2019   1. Transmetatarsal amputation.      Xr Foot Lt Min 3 V      Result Date: 11/23/2019   No acute abnormality.      Mri Low Ext Lt Wo Cont      Result Date: 12/01/2019   1. First distal phalanx osteomyelitis. 2. No abscess.       Ct Low Ext Lt W Cont      Result Date: 11/24/2019   No CT imaging evidence for soft tissue gas, abscess or osteomyelitis.      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 Fluoro Dose:  144.09 mGy. vk        Vital Signs:      Last 24hrs VS reviewed since prior progress note. Most recent are:   Visit Vitals      BP  116/64 (BP 1 Location: Left upper arm, BP Patient Position: At rest)     Pulse  77     Temp  98.8 ??F (37.1 ??C)     Resp  14     Ht  5\' 5"  (1.651 m)     Wt  76.7 kg (169 lb)     SpO2  96%        BMI  28.12 kg/m??              Intake/Output Summary (Last 24 hours) at 12/08/2019 1039   Last data filed at 12/08/2019 0534     Gross per 24 hour        Intake  --        Output  1500 ml        Net  -1500 ml              Physical Examination:                     Constitutional:   No acute distress, cooperative     ENT:   Oral mucosa moist, oropharynx benign.      Resp:   CTA bilaterally.     CV:   Regular rhythm, normal rate,          GI:    Soft, non distended, non tender.bs+         Musculoskeletal:   LLE dressing c/d  staples intact         Neurologic:   Moves all extremities.  AAOx3, CN II-XII reviewed, follows commands  Psych:  Good insight, Not anxious nor agitated.         ??                       Data Review:      Review and/or order of clinical lab test   Review and/or order of tests in the medicine section of CPT           Labs:          Recent Labs            12/08/19   0118  12/07/19   0436     WBC  7.5  8.8     HGB  7.2*  8.0*     HCT  21.3*  23.5*         PLT  312  337          Recent Labs             12/08/19   0118  12/07/19   0436  12/06/19   0620     NA  136  135*  133*     K  3.2*  3.8  4.1     CL  102  104  101     CO2  28  26  19*     BUN  15  15  16      CREA  0.84  0.79  0.89     GLU  123*  119*  214*          CA  9.0  9.3  9.8        No results for input(s): ALT, AP, TBIL, TBILI, TP, ALB, GLOB, GGT, AML, LPSE in the last 72 hours.      No lab exists for component: SGOT, GPT, AMYP, HLPSE   No results for input(s): INR, PTP, APTT, INREXT, INREXT in the last 72 hours.    No results for input(s): FE, TIBC, PSAT, FERR in the last 72 hours.    No results found for: FOL, RBCF    No results for input(s): PH, PCO2, PO2 in the last 72 hours.   No results for input(s): CPK, CKNDX, TROIQ in the last 72 hours.      No lab exists for component: CPKMB     Lab Results         Component  Value  Date/Time            Cholesterol, total  219 (H)  10/30/2018 10:27 AM       HDL Cholesterol  63  10/30/2018 10:27 AM       LDL, calculated  130 (H)  10/30/2018 10:27 AM       Triglyceride  131  10/30/2018 10:27 AM            CHOL/HDL Ratio  3.4  12/26/2007 09:12 AM          Lab Results         Component  Value  Date/Time            Glucose (POC)  131 (H)  12/08/2019 06:47 AM       Glucose (POC)  113 (H)  12/07/2019 09:35 PM       Glucose (POC)  222 (H)  12/07/2019 05:26 PM       Glucose (POC)  124 (H)  12/07/2019 12:23 PM            Glucose (  POC)  106  (H)  12/07/2019 06:16 AM        No results found for: COLOR, APPRN, SPGRU, REFSG, PHU, PROTU, GLUCU, KETU, BILU, UROU, NITU, LEUKU, GLUKE, EPSU, BACTU, WBCU, RBCU, CASTS, UCRY           Medications Reviewed:          Current Facility-Administered Medications          Medication  Dose  Route  Frequency           ?  lactulose (CHRONULAC) 10 gram/15 mL solution 30 mL   20 g  Oral  BID     ?  HYDROmorphone (PF) (DILAUDID) injection 0.5 mg   0.5 mg  IntraVENous  Q4H PRN     ?  alteplase (CATHFLO) 1 mg in sterile water (preservative free) 1 mL injection   1 mg  InterCATHeter  PRN     ?  oxyCODONE IR (ROXICODONE) tablet 10 mg   10 mg  Oral  Q4H PRN     ?  cefepime (MAXIPIME) 2 g in 0.9% sodium chloride (MBP/ADV) 100 mL MBP   2 g  IntraVENous  Q8H     ?  insulin glargine (LANTUS) injection 12 Units   12 Units  SubCUTAneous  QHS     ?  vancomycin (VANCOCIN) 1250 mg in NS 250 ml infusion   1,250 mg  IntraVENous  Q16H     ?  acetaminophen (TYLENOL) tablet 650 mg   650 mg  Oral  Q6H     ?  gabapentin (NEURONTIN) capsule 300 mg   300 mg  Oral  QHS     ?  gabapentin (NEURONTIN) capsule 100 mg   100 mg  Oral  DAILY     ?  Vancomycin- Pharmacy to Dose     Other  Rx Dosing/Monitoring     ?  metroNIDAZOLE (FLAGYL) IVPB premix 500 mg   500 mg  IntraVENous  Q12H     ?  sodium chloride (NS) flush 5-40 mL   5-40 mL  IntraVENous  Q8H     ?  sodium chloride (NS) flush 5-40 mL   5-40 mL  IntraVENous  PRN     ?  sodium phosphate (FLEET'S) enema 1 Enema   1 Enema  Rectal  PRN     ?  oxyCODONE IR (ROXICODONE) tablet 5 mg   5 mg  Oral  Q4H PRN     ?  losartan (COZAAR) tablet 100 mg   100 mg  Oral  DAILY     ?  docusate sodium (COLACE) capsule 100 mg   100 mg  Oral  BID     ?  senna (SENOKOT) tablet 8.6 mg   1 Tab  Oral  DAILY     ?  polyethylene glycol (MIRALAX) packet 17 g   17 g  Oral  DAILY PRN     ?  bisacodyL (DULCOLAX) suppository 10 mg   10 mg  Rectal  DAILY PRN     ?  hydroCHLOROthiazide (HYDRODIURIL) tablet 25 mg   25 mg  Oral  DAILY      ?  sodium chloride (NS) flush 5-40 mL   5-40 mL  IntraVENous  Q8H     ?  sodium chloride (NS) flush 5-40 mL   5-40 mL  IntraVENous  PRN     ?  acetaminophen (TYLENOL) suppository 650 mg   650 mg  Rectal  Q6H PRN     ?  promethazine (PHENERGAN) tablet 12.5 mg   12.5 mg  Oral  Q6H PRN          Or           ?  ondansetron (ZOFRAN) injection 4 mg   4 mg  IntraVENous  Q6H PRN     ?  glucose chewable tablet 16 g   4 Tab  Oral  PRN     ?  dextrose (D50W) injection syrg 12.5-25 g   25-50 mL  IntraVENous  PRN     ?  glucagon (GLUCAGEN) injection 1 mg   1 mg  IntraMUSCular  PRN     ?  insulin lispro (HUMALOG) injection     SubCUTAneous  AC&HS           ?  aspirin delayed-release tablet 81 mg   81 mg  Oral  DAILY           ?  pantoprazole (PROTONIX) tablet 40 mg   40 mg  Oral  ACB     ?  amLODIPine (NORVASC) tablet 10 mg   10 mg  Oral  DAILY           ?  enoxaparin (LOVENOX) injection 40 mg   40 mg  SubCUTAneous  Q24H        ______________________________________________________________________   EXPECTED LENGTH OF STAY: 4d 2h   ACTUAL LENGTH OF STAY:          [place], MD

## 2019-12-08 NOTE — Progress Notes (Signed)
[facility]                                                                                          Hospitalist Progress Note  [name], MD  Answering service: [phone] OR [phone] from in house phone        Date of Service:  12/08/2019  NAME:  Lindsey Huynh  DOB:  24-Jul-1951  MRN:  [id number]      Admission Summary:   From H&P 11/23/2019:  "Lindsey Huynh??is a 69 y.o.??female??with past medical history of diabetes, insulin-dependent, GERD and hypertension comes for return of left foot pain and swelling.    Interval history / Subjective:     F/U for left foot cellulitis, left great toe gangrene, DMII, HTN, GERD, Hypokalemia, left popliteal occlusion s/p left fem-pop bypass  S/P TMA POD 3 pt c/o severe constipation no BM x 3 days on NARCOTICS      Assessment & Plan:     Left great toe Osteomyelitis   PAD: Status post femoropopliteal bypass on November 28, 2019    - MRI LLE: 1. First distal phalanx osteomyelitis.  - IV antibiotics PER ID   - Vascular surgery following- Patient to follow up in 2 weeks post- op for staple removal.   - Podiatry following -TMA POD 2    LLE pain:??dry gangrene??of??left 2nd toe cellulitic changes/Left popliteal occlusion  -s/p left fem-pop bypass (03/11)  -CT LLE (03/06) NL  -Elevated Sed rate and CRP  -BC (03/06) NGTD   -Pain control  - MRI - 1. First distal phalanx osteomyelitis.. No abscess.s/p amputation  ??  Anemia   Stable, No sign of bleeding noted. hgb dropped   - hgb 7.2 today - Monitor labs and transfuse if hgb <7.0   ??  DMII:??hold metformin while IP  -Cont basal insulin, SSI  -Diabetic diet  -a1c 8.1  - DM following??  ??  HTN  -Controlled  -Cont home meds   ??  GERD  -Cont PPI  ??  Hypokalemia  -potassium 3.2 replete PRN    Code status: Full   DVT prophylaxis: Lovenox     Care Plan discussed with: Patient/Family, Nurse and Case Manager  Anticipated Disposition: SNF/LTC  [facility]ticipated Discharge: [facility] awaiting [facility]      Hospital  Problems  Date Reviewed: June 15, 2018          Codes Class Noted POA    Gangrene ([place]) ICD-10-CM: [id number]  ICD-9-CM: 785.4  11/23/2019 Unknown                Review of Systems:   A comprehensive review of systems was negative except for that written in the HPI.     Xr Foot Lt Ap/lat    Result Date: 12/05/2019  1. Transmetatarsal amputation.    Xr Foot Lt Min 3 V    Result Date: 11/23/2019  No acute abnormality.    Mri Low Ext Lt Wo Cont    Result Date: 12/01/2019  1. First distal phalanx osteomyelitis. 2. No abscess.     Ct Low Ext Lt W Cont  Result Date: 11/24/2019  No CT imaging evidence for soft tissue gas, abscess or osteomyelitis.    Retail banker Technologist Service    Result Date: 11/25/2019  FLUOROSCOPY WAS USED. Fluoro Dose:  144.09 mGy. vk    Vital Signs:    Last 24hrs VS reviewed since prior progress note. Most recent are:  Visit Vitals  BP 116/64 (BP 1 Location: Left upper arm, BP Patient Position: At rest)   Pulse 77   Temp 98.8 ??F (37.1 ??C)   Resp 14   Ht 5\' 5"  (1.651 m)   Wt 76.7 kg (169 lb)   SpO2 96%   BMI 28.12 kg/m??         Intake/Output Summary (Last 24 hours) at 12/08/2019 1039  Last data filed at 12/08/2019 0534  Gross per 24 hour   Intake ???   Output 1500 ml   Net -1500 ml        Physical Examination:             Constitutional:  No acute distress, cooperative   ENT:  Oral mucosa moist, oropharynx benign.    Resp:  CTA bilaterally.   CV:  Regular rhythm, normal rate,     GI:  Soft, non distended, non tender.bs+    Musculoskeletal:  LLE dressing c/d  staples intact    Neurologic:  Moves all extremities.  AAOx3, CN II-XII reviewed, follows commands     Psych:  Good insight, Not anxious nor agitated.     ??            Data Review:    Review and/or order of clinical lab test  Review and/or order of tests in the medicine section of CPT      Labs:     Recent Labs     12/08/19  0118 12/07/19  0436   WBC 7.5 8.8   HGB 7.2* 8.0*   HCT 21.3* 23.5*   PLT 312 337     Recent Labs     12/08/19  0118 12/07/19  0436 12/06/19  0620    NA 136 135* 133*   K 3.2* 3.8 4.1   CL 102 104 101   CO2 28 26 19*   BUN 15 15 16    CREA 0.84 0.79 0.89   GLU 123* 119* 214*   CA 9.0 9.3 9.8     No results for input(s): ALT, AP, TBIL, TBILI, TP, ALB, GLOB, GGT, AML, LPSE in the last 72 hours.    No lab exists for component: SGOT, GPT, AMYP, HLPSE  No results for input(s): INR, PTP, APTT, INREXT, INREXT in the last 72 hours.   No results for input(s): FE, TIBC, PSAT, FERR in the last 72 hours.   No results found for: FOL, RBCF   No results for input(s): PH, PCO2, PO2 in the last 72 hours.  No results for input(s): CPK, CKNDX, TROIQ in the last 72 hours.    No lab exists for component: CPKMB  Lab Results   Component Value Date/Time    Cholesterol, total 219 (H) 10/30/2018 10:27 AM    HDL Cholesterol 63 10/30/2018 10:27 AM    LDL, calculated 130 (H) 10/30/2018 10:27 AM    Triglyceride 131 10/30/2018 10:27 AM    CHOL/HDL Ratio 3.4 12/26/2007 09:12 AM     Lab Results   Component Value Date/Time    Glucose (POC) 131 (H) 12/08/2019 06:47 AM    Glucose (POC) 113 (H) 12/07/2019 09:35 PM  Glucose (POC) 222 (H) 12/07/2019 05:26 PM    Glucose (POC) 124 (H) 12/07/2019 12:23 PM    Glucose (POC) 106 (H) 12/07/2019 06:16 AM     No results found for: COLOR, APPRN, SPGRU, REFSG, PHU, PROTU, GLUCU, KETU, BILU, UROU, NITU, LEUKU, GLUKE, EPSU, BACTU, WBCU, RBCU, CASTS, UCRY      Medications Reviewed:     Current Facility-Administered Medications   Medication Dose Route Frequency   ??? lactulose (CHRONULAC) 10 gram/15 mL solution 30 mL  20 g Oral BID   ??? HYDROmorphone (PF) (DILAUDID) injection 0.5 mg  0.5 mg IntraVENous Q4H PRN   ??? alteplase (CATHFLO) 1 mg in sterile water (preservative free) 1 mL injection  1 mg InterCATHeter PRN   ??? oxyCODONE IR (ROXICODONE) tablet 10 mg  10 mg Oral Q4H PRN   ??? cefepime (MAXIPIME) 2 g in 0.9% sodium chloride (MBP/ADV) 100 mL MBP  2 g IntraVENous Q8H   ??? insulin glargine (LANTUS) injection 12 Units  12 Units SubCUTAneous QHS   ??? vancomycin (VANCOCIN)  1250 mg in NS 250 ml infusion  1,250 mg IntraVENous Q16H   ??? acetaminophen (TYLENOL) tablet 650 mg  650 mg Oral Q6H   ??? gabapentin (NEURONTIN) capsule 300 mg  300 mg Oral QHS   ??? gabapentin (NEURONTIN) capsule 100 mg  100 mg Oral DAILY   ??? Vancomycin- Pharmacy to Dose   Other Rx Dosing/Monitoring   ??? metroNIDAZOLE (FLAGYL) IVPB premix 500 mg  500 mg IntraVENous Q12H   ??? sodium chloride (NS) flush 5-40 mL  5-40 mL IntraVENous Q8H   ??? sodium chloride (NS) flush 5-40 mL  5-40 mL IntraVENous PRN   ??? sodium phosphate (FLEET'S) enema 1 Enema  1 Enema Rectal PRN   ??? oxyCODONE IR (ROXICODONE) tablet 5 mg  5 mg Oral Q4H PRN   ??? losartan (COZAAR) tablet 100 mg  100 mg Oral DAILY   ??? docusate sodium (COLACE) capsule 100 mg  100 mg Oral BID   ??? senna (SENOKOT) tablet 8.6 mg  1 Tab Oral DAILY   ??? polyethylene glycol (MIRALAX) packet 17 g  17 g Oral DAILY PRN   ??? bisacodyL (DULCOLAX) suppository 10 mg  10 mg Rectal DAILY PRN   ??? hydroCHLOROthiazide (HYDRODIURIL) tablet 25 mg  25 mg Oral DAILY   ??? sodium chloride (NS) flush 5-40 mL  5-40 mL IntraVENous Q8H   ??? sodium chloride (NS) flush 5-40 mL  5-40 mL IntraVENous PRN   ??? acetaminophen (TYLENOL) suppository 650 mg  650 mg Rectal Q6H PRN   ??? promethazine (PHENERGAN) tablet 12.5 mg  12.5 mg Oral Q6H PRN    Or   ??? ondansetron (ZOFRAN) injection 4 mg  4 mg IntraVENous Q6H PRN   ??? glucose chewable tablet 16 g  4 Tab Oral PRN   ??? dextrose (D50W) injection syrg 12.5-25 g  25-50 mL IntraVENous PRN   ??? glucagon (GLUCAGEN) injection 1 mg  1 mg IntraMUSCular PRN   ??? insulin lispro (HUMALOG) injection   SubCUTAneous AC&HS   ??? aspirin delayed-release tablet 81 mg  81 mg Oral DAILY   ??? pantoprazole (PROTONIX) tablet 40 mg  40 mg Oral ACB   ??? amLODIPine (NORVASC) tablet 10 mg  10 mg Oral DAILY   ??? enoxaparin (LOVENOX) injection 40 mg  40 mg SubCUTAneous Q24H     ______________________________________________________________________  EXPECTED LENGTH OF STAY: 4d 2h  ACTUAL LENGTH OF STAY:  [place], MD

## 2019-12-09 LAB — CBC WITH AUTOMATED DIFF
ABS. BASOPHILS: 0.1 10*3/uL (ref 0.0–0.1)
ABS. EOSINOPHILS: 0.9 10*3/uL — ABNORMAL HIGH (ref 0.0–0.4)
ABS. IMM. GRANS.: 0.1 10*3/uL — ABNORMAL HIGH (ref 0.00–0.04)
ABS. LYMPHOCYTES: 0.5 10*3/uL — ABNORMAL LOW (ref 0.8–3.5)
ABS. MONOCYTES: 0.5 10*3/uL (ref 0.0–1.0)
ABS. NEUTROPHILS: 4.5 10*3/uL (ref 1.8–8.0)
ABSOLUTE NRBC: 0 10*3/uL (ref 0.00–0.01)
BASOPHILS: 1 % (ref 0–1)
EOSINOPHILS: 13 % — ABNORMAL HIGH (ref 0–7)
HCT: 21.9 % — ABNORMAL LOW (ref 35.0–47.0)
HGB: 7.3 g/dL — ABNORMAL LOW (ref 11.5–16.0)
IMMATURE GRANULOCYTES: 1 % — ABNORMAL HIGH (ref 0.0–0.5)
LYMPHOCYTES: 7 % — ABNORMAL LOW (ref 12–49)
MCH: 31.2 PG (ref 26.0–34.0)
MCHC: 33.3 g/dL (ref 30.0–36.5)
MCV: 93.6 FL (ref 80.0–99.0)
MONOCYTES: 7 % (ref 5–13)
MPV: 10 FL (ref 8.9–12.9)
NEUTROPHILS: 71 % (ref 32–75)
NRBC: 0 PER 100 WBC
PLATELET: 274 10*3/uL (ref 150–400)
RBC: 2.34 M/uL — ABNORMAL LOW (ref 3.80–5.20)
RDW: 13.3 % (ref 11.5–14.5)
WBC: 6.6 10*3/uL (ref 3.6–11.0)

## 2019-12-09 LAB — GLUCOSE, POC
Glucose (POC): 129 mg/dL — ABNORMAL HIGH (ref 65–100)
Glucose (POC): 130 mg/dL — ABNORMAL HIGH (ref 65–100)
Glucose (POC): 175 mg/dL — ABNORMAL HIGH (ref 65–100)
Glucose (POC): 155 mg/dL — ABNORMAL HIGH (ref 65–100)

## 2019-12-09 LAB — METABOLIC PANEL, BASIC
Anion gap: 8 mmol/L (ref 5–15)
BUN/Creatinine ratio: 15 (ref 12–20)
BUN: 15 MG/DL (ref 6–20)
CO2: 26 mmol/L (ref 21–32)
Calcium: 9 MG/DL (ref 8.5–10.1)
Chloride: 105 mmol/L (ref 97–108)
Creatinine: 1.03 MG/DL — ABNORMAL HIGH (ref 0.55–1.02)
GFR est AA: >60 mL/min/{1.73_m2} (ref >60)
GFR est non-AA: 53 mL/min/{1.73_m2} — ABNORMAL LOW (ref >60)
Glucose: 145 mg/dL — ABNORMAL HIGH (ref 65–100)
Potassium: 3.1 mmol/L — ABNORMAL LOW (ref 3.5–5.1)
Sodium: 139 mmol/L (ref 136–145)

## 2019-12-09 LAB — VANCOMYCIN, TROUGH: Vancomycin,trough: 21.1 ug/mL — CR (ref 5.0–10.0)

## 2019-12-09 LAB — POCT GLUCOSE
POC Glucose: 129 mg/dL — ABNORMAL HIGH (ref 65–100)
POC Glucose: 130 mg/dL — ABNORMAL HIGH (ref 65–100)
POC Glucose: 175 mg/dL — ABNORMAL HIGH (ref 65–100)
POC Glucose: 155 mg/dL — ABNORMAL HIGH (ref 65–100)

## 2019-12-09 LAB — CBC WITH AUTO DIFFERENTIAL
Basophils %: 1 % (ref 0–1)
Basophils Absolute: 0.1 10*3/uL (ref 0.0–0.1)
Eosinophils %: 13 % — ABNORMAL HIGH (ref 0–7)
Eosinophils Absolute: 0.9 10*3/uL — ABNORMAL HIGH (ref 0.0–0.4)
Granulocyte Absolute Count: 0.1 10*3/uL — ABNORMAL HIGH (ref 0.00–0.04)
Hematocrit: 21.9 % — ABNORMAL LOW (ref 35.0–47.0)
Hemoglobin: 7.3 g/dL — ABNORMAL LOW (ref 11.5–16.0)
Immature Granulocytes %: 1 % — ABNORMAL HIGH (ref 0.0–0.5)
Lymphocytes %: 7 % — ABNORMAL LOW (ref 12–49)
Lymphocytes Absolute: 0.5 10*3/uL — ABNORMAL LOW (ref 0.8–3.5)
MCH: 31.2 PG (ref 26.0–34.0)
MCHC: 33.3 g/dL (ref 30.0–36.5)
MCV: 93.6 FL (ref 80.0–99.0)
MPV: 10 FL (ref 8.9–12.9)
Monocytes %: 7 % (ref 5–13)
Monocytes Absolute: 0.5 10*3/uL (ref 0.0–1.0)
NRBC Absolute: 0 10*3/uL (ref 0.00–0.01)
Neutrophils %: 71 % (ref 32–75)
Neutrophils Absolute: 4.5 10*3/uL (ref 1.8–8.0)
Nucleated RBCs: 0 PER 100 WBC
Platelets: 274 10*3/uL (ref 150–400)
RBC: 2.34 M/uL — ABNORMAL LOW (ref 3.80–5.20)
RDW: 13.3 % (ref 11.5–14.5)
WBC: 6.6 10*3/uL (ref 3.6–11.0)

## 2019-12-09 LAB — VANCOMYCIN TROUGH: Vancomycin Tr: 21.1 ug/mL (ref 5.0–10.0)

## 2019-12-09 LAB — BASIC METABOLIC PANEL
Anion Gap: 8 mmol/L (ref 5–15)
BUN/Creatinine Ratio: 15 (ref 12–20)
BUN: 15 MG/DL (ref 6–20)
CO2: 26 mmol/L (ref 21–32)
Calcium: 9 MG/DL (ref 8.5–10.1)
Chloride: 105 mmol/L (ref 97–108)
Creatinine: 1.03 MG/DL — ABNORMAL HIGH (ref 0.55–1.02)
GFR African American: >60 mL/min/{1.73_m2} (ref >60)
Glucose: 145 mg/dL — ABNORMAL HIGH (ref 65–100)
Potassium: 3.1 mmol/L — ABNORMAL LOW (ref 3.5–5.1)
Sodium: 139 mmol/L (ref 136–145)
eGFR NON-AA: 53 mL/min/{1.73_m2} — ABNORMAL LOW (ref >60)

## 2019-12-09 MED ORDER — CEFEPIME 2 GRAM SOLUTION FOR INJECTION
2 gram | Freq: Two times a day (BID) | INTRAMUSCULAR | Status: AC
Start: 2019-12-09 — End: 2019-12-10
  Administered 2019-12-10 (×2): via INTRAVENOUS

## 2019-12-09 MED ORDER — SODIUM CHLORIDE 0.9 % IV
1000 mg | INTRAVENOUS | Status: DC
Start: 2019-12-09 — End: 2019-12-09

## 2019-12-09 MED ORDER — POTASSIUM CHLORIDE SR 10 MEQ TAB
10 mEq | Freq: Two times a day (BID) | ORAL | Status: AC
Start: 2019-12-09 — End: 2019-12-10
  Administered 2019-12-09 – 2019-12-10 (×4): via ORAL

## 2019-12-09 MED ORDER — PHARMACY VANCOMYCIN NOTE
Freq: Once | Status: AC
Start: 2019-12-09 — End: 2019-12-09
  Administered 2019-12-09: 14:00:00

## 2019-12-09 MED FILL — DOK 100 MG CAPSULE: 100 mg | ORAL | Qty: 1

## 2019-12-09 MED FILL — OXYCODONE 5 MG TAB: 5 mg | ORAL | Qty: 1

## 2019-12-09 MED FILL — INSULIN LISPRO 100 UNIT/ML INJECTION: 100 unit/mL | SUBCUTANEOUS | Qty: 1

## 2019-12-09 MED FILL — K-TAB 10 MEQ TABLET,EXTENDED RELEASE: 10 mEq | ORAL | Qty: 4

## 2019-12-09 MED FILL — GABAPENTIN 100 MG CAP: 100 mg | ORAL | Qty: 1

## 2019-12-09 MED FILL — OXYCODONE 5 MG TAB: 5 mg | ORAL | Qty: 2

## 2019-12-09 MED FILL — CEFEPIME 2 GRAM SOLUTION FOR INJECTION: 2 gram | INTRAMUSCULAR | Qty: 2

## 2019-12-09 MED FILL — VANCOMYCIN 10 GRAM IV SOLR: 10 gram | INTRAVENOUS | Qty: 1250

## 2019-12-09 MED FILL — SENNA LAX 8.6 MG TABLET: 8.6 mg | ORAL | Qty: 1

## 2019-12-09 MED FILL — ACETAMINOPHEN 325 MG TABLET: 325 mg | ORAL | Qty: 2

## 2019-12-09 MED FILL — ENOXAPARIN 40 MG/0.4 ML SUB-Q SYRINGE: 40 mg/0.4 mL | SUBCUTANEOUS | Qty: 0.4

## 2019-12-09 MED FILL — ASPIRIN 81 MG TAB, DELAYED RELEASE: 81 mg | ORAL | Qty: 1

## 2019-12-09 MED FILL — PHARMACY VANCOMYCIN NOTE: Qty: 1

## 2019-12-09 MED FILL — PANTOPRAZOLE 40 MG TAB, DELAYED RELEASE: 40 mg | ORAL | Qty: 1

## 2019-12-09 MED FILL — LACTULOSE 20 GRAM/30 ML ORAL SOLUTION: 20 gram/30 mL | ORAL | Qty: 30

## 2019-12-09 MED FILL — METRO I.V. 500 MG/100 ML INTRAVENOUS PIGGYBACK: 500 mg/100 mL | INTRAVENOUS | Qty: 100

## 2019-12-09 MED FILL — GABAPENTIN 300 MG CAP: 300 mg | ORAL | Qty: 1

## 2019-12-09 MED FILL — INSULIN GLARGINE 100 UNIT/ML INJECTION: 100 unit/mL | SUBCUTANEOUS | Qty: 1

## 2019-12-09 NOTE — Progress Notes (Signed)
Dr. Lorene Dy said to hold patient's amlodipine, losartan, and hydrochlorothiazide with her BP 111/64 this morning.

## 2019-12-09 NOTE — Progress Notes (Signed)
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  The extrapolated "true" trough is approximately 20.54 mcg/mL based on the patient's known kinetics.       Goal trough: 15 - 20 mcg/mL       Plan: Infusion stopped as soon a critical lab resulted; ~750 mg of dose infused. Change to 1000 mg Q 16hrs is predicting a therapeutic trough.  Pharmacy will continue to monitor this patient daily for changes in clinical status and renal function.    Kirke Corin, PharmD  Clinical Pharmacist  [facility] [phone])

## 2019-12-09 NOTE — Progress Notes (Signed)
Day #8 of Cefepime  Indication:    Osteomyelitis - first distal phalanx of L great toe; dry gangrene on L 2nd toe  Current regimen:  2 g IV every 8 hours  Abx regimen: Cefepime monotherapy  Recent Labs     12/09/19  0253 12/08/19  0118 12/07/19  0436   WBC 6.6 7.5 8.8   CREA 1.03* 0.84 0.79   BUN 15 15 15    Est CrCl: 52.1 ml/min; UO: 0.3 mL/kg/hr (once occurrence)  Temp (24hrs), Avg:98.4 ??F (36.9 ??C), Min:98 ??F (36.7 ??C), Max:98.6 ??F (37 ??C)    Cultures:   3/6 pBlood - NG, final    Plan: Change to 2 g IV every 12 hours per [facility] P&T Committee Protocol with respect to renal function (CrCl 30-60 ml/min).  Pharmacy will continue to monitor patient daily and will make dosage adjustments based upon changing renal function.    Kirke Corin, PharmD  Clinical Pharmacist, Orthopedics and Med/Surg  [place] [phone])

## 2019-12-09 NOTE — Progress Notes (Signed)
Bedside and Verbal shift change report given to [place] (Soil scientist) by Judson Roch (offgoing nurse). Report included the following information SBAR, Kardex, Procedure Summary, Intake/Output and MAR.

## 2019-12-09 NOTE — Progress Notes (Addendum)
I agree with care, assessment and charting on this patient by Astrid Drafts, RN.        Bedside and Verbal shift change report given to Judson Roch, RN (oncoming nurse) by Janett Billow, RN (offgoing nurse). Report included the following information SBAR, Kardex, ED Summary, OR Summary, Procedure Summary, Intake/Output, MAR and Recent Results.

## 2019-12-09 NOTE — Progress Notes (Addendum)
TOC: Authorization pending with [place]: 19%    Authorization pending with [facility] per Modesta Messing for [place] to transport patient when stable for discharge. Patient updated and aware of plan. CM following.    RUR: 19%    Nathanial Millman RN/CRM

## 2019-12-09 NOTE — Progress Notes (Signed)
Problem: Mobility Impaired (Adult and Pediatric)  Goal: *Acute Goals and Plan of Care (Insert Text)  Description: FUNCTIONAL STATUS PRIOR TO ADMISSION: Patient was independent and active without use of DME.    HOME SUPPORT PRIOR TO ADMISSION: The patient lived alone with no local support.    Physical Therapy  Reassessed post-surgery 12/06/2019  1.  Patient will move from supine to sit and sit to supine , scoot up and down, and roll side to side in bed with supervision/set-up within 7 day(s).    2.  Patient will transfer from bed to chair and chair to bed with supervision/set-up using the least restrictive device within 7 day(s).  3.  Patient will perform sit to stand with supervision/set-up within 7 day(s).  4.  Patient will ambulate with min A for 50 feet with the least restrictive device within 7 day(s).   5.  Patient will ascend/descend 4 stairs with cane and one handrail(s) with supervision/set-up within 7 day(s).   Goals remain appropriate and will continue to work toward same.      Physical Therapy Goals  Initiated 11/26/2019  1.  Patient will move from supine to sit and sit to supine , scoot up and down, and roll side to side in bed with supervision/set-up within 7 day(s).    2.  Patient will transfer from bed to chair and chair to bed with supervision/set-up using the least restrictive device within 7 day(s).  3.  Patient will perform sit to stand with supervision/set-up within 7 day(s).  4.  Patient will ambulate with supervision/set-up for 50 feet with the least restrictive device within 7 day(s).   5.  Patient will ascend/descend 4 stairs with cane and one handrail(s) with supervision/set-up within 7 day(s).     Outcome: Progressing Towards Goal    PHYSICAL THERAPY TREATMENT  Patient: Lindsey Huynh [id number] y.o. female)  Date: 12/09/2019  Diagnosis: Gangrene (Finger) [I96] <principal problem not specified>  Procedure(s) (LRB):  LEFT TRANSMETATARSAL AMPUTATION WITH TENDON BALANCING (Left) 4 Days Post-Op   Precautions: (LLE PWB< 5 steps in the postop shoe, NWB longer distances)  Chart, physical therapy assessment, plan of care and goals were reviewed.    ASSESSMENT  Patient continues with skilled PT services and is progressing towards goals. Patient is painful this afternoon limiting mobility. Reviewed PWB with transferring vs NWB for longer distances. Patient is tired but agreeable to up to the chair, using RW appropriately. Encouraged to be up the chair x 1 hour x 2 daily. Reviewed LE exercises.     Current Level of Function Impacting Discharge (mobility/balance): min assist x 1 transfers    Other factors to consider for discharge: Lives alone, previously independent with mobility         PLAN :  Patient continues to benefit from skilled intervention to address the above impairments.  Continue treatment per established plan of care.  to address goals.    Recommendation for discharge: (in order for the patient to meet his/her long term goals)  Therapy 3 hours per day 5-7 days per week    This discharge recommendation:  Has been made in collaboration with the attending provider and/or case management    IF patient discharges home will need the following DME: to be determined (TBD)       SUBJECTIVE:   Patient stated ???I will get to the chair if that's what I need to do.???    OBJECTIVE DATA SUMMARY:   Critical Behavior:  Neurologic State: Alert  Orientation  Level: Oriented X4  Cognition: Appropriate decision making, Follows commands  Safety/Judgement: Awareness of environment    Functional Mobility Training:  Bed Mobility:  Supine to Sit: Contact guard assistance    Transfers:  Sit to Stand: Minimum assistance;Assist x1;Additional time;Adaptive equipment  Stand to Sit: Minimum assistance;Assist x1  Bed to Chair: Minimum assistance;Assist x1;Additional time;Adaptive equipment    Balance:  Sitting: Intact  Standing: Impaired;With support  Standing - Static: Good;Constant support  Standing - Dynamic : Fair;Constant support     Therapeutic Exercises:   Reviewed glut sets, quad sets, SLR,     Pain Rating:  Up to 8/10 with mobility    Activity Tolerance:   Fair, requires frequent rest breaks, observed SOB with activity, and requesting pain medication at the end of the session. Nursing aware.    After treatment patient left in no apparent distress:   Sitting in chair and Call bell within reach    COMMUNICATION/COLLABORATION:   The patient???s plan of care was discussed with: Registered nurse.     Paulina Fusi, PT, DPT  Geriatric Clinical Specialist     Time Calculation: 20 mins

## 2019-12-09 NOTE — Progress Notes (Signed)
Vascular:    POD #11 after left leg bypass. Leg incisions OK, staples in place, palpable graft pulse.    Will continue to follow. Staples to remain 2 weeks.

## 2019-12-09 NOTE — Progress Notes (Signed)
 Day #8 of Cefepime  Indication:    Osteomyelitis - first distal phalanx of L great toe; dry gangrene on L 2nd toe  Current regimen:  2 g IV every 8 hours  Abx regimen: Cefepime monotherapy  Recent Labs     12/09/19  0253 12/08/19  0118 12/07/19  0436   WBC 6.6 7.5 8.8   CREA 1.03* 0.84 0.79   BUN 15 15 15    Est CrCl: 52.1 ml/min; UO: 0.3 mL/kg/hr (once occurrence)  Temp (24hrs), Avg:98.4 F (36.9 C), Min:98 F (36.7 C), Max:98.6 F (37 C)    Cultures:   3/6 pBlood - NG, final    Plan: Change to 2 g IV every 12 hours per [facility] P&T Committee Protocol with respect to renal function (CrCl 30-60 ml/min).  Pharmacy will continue to monitor patient daily and will make dosage adjustments based upon changing renal function.    Prentice Bray, PharmD  Clinical Pharmacist, Orthopedics and Med/Surg  [facility] [phone])

## 2019-12-09 NOTE — Progress Notes (Signed)
Dr. Sherian Maroon said to hold patient's amlodipine, losartan, and hydrochlorothiazide with her BP 111/64 this morning.

## 2019-12-09 NOTE — Progress Notes (Signed)
TOC: Authorization pending with [place]: 19%    Authorization pending with [facility] per Modesta Messing for [place] to transport patient when stable for discharge. Patient updated and aware of plan. CM following.    RUR: 19%    Nathanial Millman RN/CRM

## 2019-12-09 NOTE — Progress Notes (Signed)
Progress Notes by [name], MD at 12/09/19 1109                Author: [name], MD  Service: Hospitalist  Author Type: Physician       Filed: 12/09/19 1115  Date of Service: 12/09/19 1109  Status: Addendum          Editor: [name], MD (Physician)          Related Notes: Original Note by [name], MD (Physician) filed at 12/09/19 [facility]                                                                                              Hospitalist Progress Note   [name], MD   Answering service: [phone] OR [phone] from in house phone            Date of Service:  12/09/2019   NAME:  Lindsey Huynh   DOB:  1950/12/02   MRN:  [id number]           Admission Summary:        From H&P 11/23/2019:   "Lurena Begley??is a 69 y.o.??female??with past medical history of diabetes, insulin-dependent, GERD and hypertension comes for return of left foot pain and swelling.      Interval history / Subjective:          F/U for left foot cellulitis, left great toe gangrene, DMII, HTN, GERD, Hypokalemia, left popliteal occlusion s/p left fem-pop bypass   S/P TMA POD 4 seen by podiatry and pt is  cleared for d/c surgical path report unavailable             Assessment & Plan:          Left great toe Osteomyelitis    PAD: Status post femoropopliteal bypass on November 28, 2019      - MRI LLE: 1. First distal phalanx osteomyelitis.   - IV antibiotics PER ID light pseudomonas and will cont cefepime   - Vascular surgery following- Patient to follow up in 2 weeks post- op for staple removal.    - Podiatry following -TMA POD 2      LLE pain:??dry gangrene??of??left 2nd toe cellulitic changes/Left popliteal occlusion   -s/p left fem-pop bypass (03/11)   -CT LLE (03/06) NL   -Elevated Sed rate and CRP   -BC (03/06) NGTD    -Pain control   - MRI - 1. First distal phalanx osteomyelitis.. No abscess.s/p amputation   ??   Anemia    Stable, No sign of  bleeding noted. hgb dropped    - hgb 7.2 today - Monitor labs and transfuse if hgb <7.0    ??   DMII:??hold metformin while IP   -Cont basal insulin, SSI   -Diabetic diet   -a1c 8.1   - DM following??   ??   HTN   -Controlled   -Cont home meds    ??   GERD   -  Cont PPI   ??   Hypokalemia   -potassium 3.2 replete PRN      Code status: Full    DVT prophylaxis: Lovenox       Care Plan discussed with: Patient/Family, Nurse and Case Manager   Anticipated Disposition: ready for d/c    Anticipated Discharge: [facility]            Hospital Problems   Date Reviewed:  05/31/2018                         Codes  Class  Noted  POA              Gangrene ([place])  ICD-10-CM: [id number]   ICD-9-CM: 785.4    11/23/2019  Unknown                               Review of Systems:     A comprehensive review of systems was negative except for that written in the HPI.       Xr Foot Lt Ap/lat      Result Date: 12/05/2019   1. Transmetatarsal amputation.      Xr Foot Lt Min 3 V      Result Date: 11/23/2019   No acute abnormality.      Mri Low Ext Lt Wo Cont      Result Date: 12/01/2019   1. First distal phalanx osteomyelitis. 2. No abscess.       Ct Low Ext Lt W Cont      Result Date: 11/24/2019   No CT imaging evidence for soft tissue gas, abscess or osteomyelitis.      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 Fluoro Dose:  144.09 mGy. vk        Vital Signs:      Last 24hrs VS reviewed since prior progress note. Most recent are:   Visit Vitals      BP  111/64 (BP 1 Location: Left upper arm, BP Patient Position: At rest)     Pulse  83     Temp  98 ??F (36.7 ??C)     Resp  17     Ht  5\' 5"  (1.651 m)     Wt  76.7 kg (169 lb)     SpO2  99%        BMI  28.12 kg/m??              Intake/Output Summary (Last 24 hours) at 12/09/2019 1109   Last data filed at 12/09/2019 0025     Gross per 24 hour        Intake  --        Output  300 ml        Net  -300 ml              Physical Examination:                     Constitutional:   No acute  distress, cooperative     ENT:   Oral mucosa moist, oropharynx benign.      Resp:   CTA bilaterally.     CV:   Regular rhythm, normal rate,          GI:   Soft, non distended, non tender.bs+  Musculoskeletal:   LLE dressing c/d  staples intact         Neurologic:   Moves all extremities.  AAOx3, CN II-XII reviewed, follows commands       Psych:  Good insight, Not anxious nor agitated.         ??                       Data Review:      Review and/or order of clinical lab test   Review and/or order of tests in the medicine section of CPT           Labs:          Recent Labs            12/09/19   0253  12/08/19   0118     WBC  6.6  7.5     HGB  7.3*  7.2*     HCT  21.9*  21.3*         PLT  274  312          Recent Labs             12/09/19   0253  12/08/19   0118  12/07/19   0436     NA  139  136  135*     K  3.1*  3.2*  3.8     CL  105  102  104     CO2  26  28  26      BUN  15  15  15      CREA  1.03*  0.84  0.79     GLU  145*  123*  119*          CA  9.0  9.0  9.3        No results for input(s): ALT, AP, TBIL, TBILI, TP, ALB, GLOB, GGT, AML, LPSE in the last 72 hours.      No lab exists for component: SGOT, GPT, AMYP, HLPSE   No results for input(s): INR, PTP, APTT, INREXT, INREXT in the last 72 hours.    No results for input(s): FE, TIBC, PSAT, FERR in the last 72 hours.    No results found for: FOL, RBCF    No results for input(s): PH, PCO2, PO2 in the last 72 hours.   No results for input(s): CPK, CKNDX, TROIQ in the last 72 hours.      No lab exists for component: CPKMB     Lab Results         Component  Value  Date/Time            Cholesterol, total  219 (H)  10/30/2018 10:27 AM       HDL Cholesterol  63  10/30/2018 10:27 AM       LDL, calculated  130 (H)  10/30/2018 10:27 AM       Triglyceride  131  10/30/2018 10:27 AM            CHOL/HDL Ratio  3.4  12/26/2007 09:12 AM          Lab Results         Component  Value  Date/Time            Glucose (POC)  130 (H)  12/09/2019 06:24 AM       Glucose (POC)  129  (H)  12/08/2019 09:33 PM  Glucose (POC)  229 (H)  12/08/2019 05:10 PM       Glucose (POC)  179 (H)  12/08/2019 11:48 AM            Glucose (POC)  131 (H)  12/08/2019 06:47 AM        No results found for: COLOR, APPRN, SPGRU, REFSG, PHU, PROTU, GLUCU, KETU, BILU, UROU, NITU, LEUKU, GLUKE, EPSU, BACTU, WBCU, RBCU, CASTS, UCRY           Medications Reviewed:          Current Facility-Administered Medications          Medication  Dose  Route  Frequency           ?  [START ON 12/10/2019] vancomycin (VANCOCIN) 1,000 mg in 0.9% sodium chloride 250 mL (VIAL-MATE)   1,000 mg  IntraVENous  Q16H     ?  potassium chloride SR (KLOR-CON 10) tablet 40 mEq   40 mEq  Oral  BID     ?  lactulose (CHRONULAC) 10 gram/15 mL solution 30 mL   20 g  Oral  BID     ?  HYDROmorphone (PF) (DILAUDID) injection 0.5 mg   0.5 mg  IntraVENous  Q4H PRN     ?  alteplase (CATHFLO) 1 mg in sterile water (preservative free) 1 mL injection   1 mg  InterCATHeter  PRN     ?  oxyCODONE IR (ROXICODONE) tablet 10 mg   10 mg  Oral  Q4H PRN     ?  cefepime (MAXIPIME) 2 g in 0.9% sodium chloride (MBP/ADV) 100 mL MBP   2 g  IntraVENous  Q8H     ?  insulin glargine (LANTUS) injection 12 Units   12 Units  SubCUTAneous  QHS     ?  acetaminophen (TYLENOL) tablet 650 mg   650 mg  Oral  Q6H     ?  gabapentin (NEURONTIN) capsule 300 mg   300 mg  Oral  QHS     ?  gabapentin (NEURONTIN) capsule 100 mg   100 mg  Oral  DAILY     ?  Vancomycin- Pharmacy to Dose     Other  Rx Dosing/Monitoring     ?  metroNIDAZOLE (FLAGYL) IVPB premix 500 mg   500 mg  IntraVENous  Q12H     ?  sodium chloride (NS) flush 5-40 mL   5-40 mL  IntraVENous  Q8H     ?  sodium chloride (NS) flush 5-40 mL   5-40 mL  IntraVENous  PRN     ?  sodium phosphate (FLEET'S) enema 1 Enema   1 Enema  Rectal  PRN     ?  oxyCODONE IR (ROXICODONE) tablet 5 mg   5 mg  Oral  Q4H PRN     ?  losartan (COZAAR) tablet 100 mg   100 mg  Oral  DAILY     ?  docusate sodium (COLACE) capsule 100 mg   100 mg  Oral  BID     ?   senna (SENOKOT) tablet 8.6 mg   1 Tab  Oral  DAILY     ?  polyethylene glycol (MIRALAX) packet 17 g   17 g  Oral  DAILY PRN     ?  bisacodyL (DULCOLAX) suppository 10 mg   10 mg  Rectal  DAILY PRN     ?  hydroCHLOROthiazide (HYDRODIURIL) tablet 25 mg   25 mg  Oral  DAILY     ?  sodium chloride (NS) flush 5-40 mL   5-40 mL  IntraVENous  Q8H     ?  sodium chloride (NS) flush 5-40 mL   5-40 mL  IntraVENous  PRN     ?  acetaminophen (TYLENOL) suppository 650 mg   650 mg  Rectal  Q6H PRN     ?  promethazine (PHENERGAN) tablet 12.5 mg   12.5 mg  Oral  Q6H PRN          Or           ?  ondansetron (ZOFRAN) injection 4 mg   4 mg  IntraVENous  Q6H PRN     ?  glucose chewable tablet 16 g   4 Tab  Oral  PRN     ?  dextrose (D50W) injection syrg 12.5-25 g   25-50 mL  IntraVENous  PRN           ?  glucagon (GLUCAGEN) injection 1 mg   1 mg  IntraMUSCular  PRN           ?  insulin lispro (HUMALOG) injection     SubCUTAneous  AC&HS     ?  aspirin delayed-release tablet 81 mg   81 mg  Oral  DAILY     ?  pantoprazole (PROTONIX) tablet 40 mg   40 mg  Oral  ACB     ?  amLODIPine (NORVASC) tablet 10 mg   10 mg  Oral  DAILY           ?  enoxaparin (LOVENOX) injection 40 mg   40 mg  SubCUTAneous  Q24H        ______________________________________________________________________   EXPECTED LENGTH OF STAY: 4d 2h   ACTUAL LENGTH OF STAY:          [place], MD

## 2019-12-09 NOTE — Progress Notes (Signed)
Progress Notes by [name], DPM at 12/09/19 1043                Author: [name], DPM  Service: Podiatry  Author Type: Physician       Filed: 12/09/19 1048  Date of Service: 12/09/19 1043  Status: Signed          Editor: [name], DPM (Physician)                                                                     Francoise Schaumann, DPM - Despina Pole. 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,  pathology proximal margin pending       Post operative recommendations:    Pt can partial weight bear for short distances <5 steps in surgical shoe left foot   Dressings with adaptic, betadine, dry sterile dressing left foot, betadine and bandaid to ulcerations left calf; change 2-3 x /week   Surgical site appears to be healing with adequate perfusion   Pt is stable for discharge from a podiatry standpoint- planning d/c to SNF, pending proximal margin from pathology and final ID recommendations   Pt should f/u with me 1 week after discharge for close post op f/u      Do not hesitate to contact us via [employer] or phone (personal [phone]) with any questions.      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   Musculoskeletal: no joint / extremity pain, misalignment, stiffness, decreased ROM, crepitus.   Integument: No pruritis, rashes, lesions, left foot and leg wounds.   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  Neuromuscular Blockers, Steroidal  Other (comments)  GI Upset        History reviewed. No pertinent family history.    @HXMEDICAL @     Past Surgical History:         Procedure  Laterality  Date          ?  HX COLONOSCOPY         ?  HX ORTHOPAEDIC    06-30-10          back surgery (tumor removed)          ?  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  Smoking status:  Former Smoker              Years:  10.00         ?  Smokeless tobacco:  Never Used       Substance Use Topics         ?  Alcohol use:  Yes              Alcohol/week:  5.0 standard drinks              Types:  6 Cans of beer per week            Social History          Substance and Sexual Activity        Alcohol Use  Yes         ?  Alcohol/week:  5.0 standard drinks         ?  Types:  6 Cans of beer per week          Social History          Substance and Sexual Activity        Drug Use  No           Social History          Tobacco Use        Smoking Status  Former Smoker         ?  Years:  10.00        Smokeless Tobacco  Never Used          Current Facility-Administered Medications          Medication  Dose  Route  Frequency           ?  lactulose (CHRONULAC) 10 gram/15 mL solution 30 mL   20 g  Oral  BID     ?  HYDROmorphone (PF) (DILAUDID) injection 0.5 mg   0.5 mg  IntraVENous  Q4H PRN     ?  alteplase (CATHFLO) 1 mg in sterile water (preservative free) 1 mL injection   1 mg  InterCATHeter  PRN           ?  oxyCODONE IR (ROXICODONE) tablet 10 mg   10 mg  Oral  Q4H PRN            ?  cefepime (MAXIPIME) 2 g in 0.9% sodium chloride (MBP/ADV) 100 mL MBP   2 g  IntraVENous  Q8H     ?  insulin glargine (LANTUS) injection 12 Units   12 Units  SubCUTAneous  QHS     ?  vancomycin (VANCOCIN) 1250 mg in  NS 250 ml infusion   1,250 mg  IntraVENous  Q16H     ?  acetaminophen (TYLENOL) tablet 650 mg   650 mg  Oral  Q6H     ?  gabapentin (NEURONTIN) capsule 300 mg   300 mg  Oral  QHS     ?  gabapentin (NEURONTIN) capsule 100 mg   100 mg  Oral  DAILY     ?  Vancomycin- Pharmacy to Dose     Other  Rx Dosing/Monitoring     ?  metroNIDAZOLE (FLAGYL) IVPB premix 500 mg   500 mg  IntraVENous  Q12H     ?  sodium chloride (NS) flush 5-40 mL   5-40 mL  IntraVENous  Q8H     ?  sodium chloride (NS) flush 5-40 mL   5-40 mL  IntraVENous  PRN     ?  sodium phosphate (FLEET'S) enema 1 Enema   1 Enema  Rectal  PRN     ?  oxyCODONE IR (ROXICODONE) tablet 5 mg   5 mg  Oral  Q4H PRN     ?  losartan (COZAAR) tablet 100 mg   100 mg  Oral  DAILY     ?  docusate sodium (COLACE) capsule 100 mg   100 mg  Oral  BID     ?  senna (SENOKOT) tablet 8.6 mg   1 Tab  Oral  DAILY     ?  polyethylene glycol (MIRALAX) packet 17 g   17 g  Oral  DAILY PRN     ?  bisacodyL (DULCOLAX) suppository 10 mg   10 mg  Rectal  DAILY PRN     ?  hydroCHLOROthiazide (HYDRODIURIL) tablet 25 mg   25 mg  Oral  DAILY     ?  sodium chloride (NS) flush 5-40 mL   5-40 mL  IntraVENous  Q8H     ?  sodium chloride (NS) flush 5-40 mL   5-40 mL  IntraVENous  PRN     ?  acetaminophen (TYLENOL) suppository 650 mg   650 mg  Rectal  Q6H PRN     ?  promethazine (PHENERGAN) tablet 12.5 mg   12.5 mg  Oral  Q6H PRN          Or           ?  ondansetron (ZOFRAN) injection 4 mg   4 mg  IntraVENous  Q6H PRN     ?  glucose chewable tablet 16 g   4 Tab  Oral  PRN     ?  dextrose (D50W) injection syrg 12.5-25 g   25-50 mL  IntraVENous  PRN     ?  glucagon (GLUCAGEN) injection 1 mg   1 mg  IntraMUSCular  PRN     ?  insulin lispro (HUMALOG) injection     SubCUTAneous  AC&HS      ?  aspirin delayed-release tablet 81 mg   81 mg  Oral  DAILY     ?  pantoprazole (PROTONIX) tablet 40 mg   40 mg  Oral  ACB     ?  amLODIPine (NORVASC) tablet 10 mg   10 mg  Oral  DAILY           ?  enoxaparin (LOVENOX) injection 40 mg   40 mg  SubCUTAneous  Q24H            Objective:   Vitals:    Patient Vitals  for the past 12 hrs:            BP  Temp  Pulse  Resp  SpO2            12/09/19 0803  111/64  98 ??F (36.7 ??C)  83  17  99 %            12/09/19 0222  106/67  98.6 ??F (37 ??C)  69  16  96 %           Vascular:   B/L LE   DP 0/4; PT 0/4   capillary fill time brisk, pitting edema is present, skin temperature is cool, varicosities are present.      Dermatological:   Nails are thickened, discolored, painful to palpation, 14mm thick, with subungual debris.              There is no maceration of the interspaces of the feet b/l.  No focal hyperkeratosis      Wound: 1    Location: surgical incisions left forefoot and posterior Achilles   Margins: approximated with sutures intact   Drainage: none   Odor: none   Wound base: skin edges granular   Lymphangitic streaking? No.   Undermining? No.   Sinus tracts? No.   Exposed bone? No.   Subcutaneous crepitation on palpation? No.         Neurological:   DTR are present, protective sensation per 5.07 Semmes Weinstein monofilament is absent, patient is AAOx3, mood is normal. Epicritic sensation is intact.      Orthopedic:   B/L LE are symmetric, ROM of ankle, STJ, 1st MTPJ is limited, MMT 5 out of 5 for B/L LE.  No pedal amputations noted      Constitutional: Pt is a well developed elderly female.            Imaging:   Exam Information            Status  Exam Begun    Exam Ended             Final [99]  12/05/2019  21:04  12/05/2019 ??9:08 PM  [id number] ??9:08 PM     Result Information         Status: Final result (Exam End: 12/05/2019 21:08)  Provider Status: Open     Study Result        EXAM: XR FOOT LT AP/LAT   ??   INDICATION: post op xray s/p transmetatarsal amputation.   ??    COMPARISON: None.   ??   FINDINGS: Two views of the left foot demonstrate transmetatarsal amputation. No   evidence of immediate complication.   ??   IMPRESSION   1. Transmetatarsal amputation.        Exam Information            Status  Exam Begun    Exam Ended             Final [99]  11/29/2019  20:49  11/29/2019 ??9:17 PM  [id number] ??9:17 PM     Result Information         Status: Final result (Exam End: 11/29/2019 21:17)  Provider Status: Open     Study Result        PRELIMINARY REPORT   Soft tissue defect on the dorsal aspect of the first distal phalanx with   underlying marrow edema as seen on the STIR images.   ??   ??   Preliminary report was  provided by Dr. Art Buff, the on-call radiologist, at   21:41 hours on 11/29/2019   ??   Final report to follow.   ??   FINAL REPORT BELOW   ??   EXAM:  MRI LOW EXT LT WO CONT   ??   INDICATION: Left foot infection. Diabetes.   ??   COMPARISON: CT left foot on 11/23/2019.   ??   TECHNIQUE: Axial, coronal, and sagittal MRI of the left forefoot in the T1 and   inversion recovery pulse sequences with and without fat saturation .   ??   CONTRAST: None.   ??   FINDINGS:    ??   Bone marrow: Subtle cortical loss of the great toe distal phalanx. Bone marrow   edema is in the great toe distal phalanx.   ??   Remaining bone marrow signal is heterogeneous but within normal limits. No acute   fracture or osteonecrosis.   ??   Joint fluid: Physiologic.   ??   Tendons: Intact.   ??   Muscles: Mild atrophy. Moderate edema.   ??   Neurovascular bundles: No neuroma. Mild first and third intermetatarsal   bursitis.   ??   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 No septic   arthritis. Normal Lisfranc joint.   ??   Soft tissue mass: Mild cellulitis of the toes. No abscess.   ??   IMPRESSION   ??   1. First distal phalanx osteomyelitis.   2. No abscess.           Exam Information            Status  Exam Begun    Exam Ended             Final [99]  11/23/2019  12:48  11/23/2019 12:52 PM   [id number] 12:52 PM     Result Information         Status: Final result (Exam End: 11/23/2019 12:52)  Provider Status: Open     Study Result        EXAM: XR FOOT LT MIN 3 V   ??   INDICATION: left foot pain.   ??   COMPARISON: None.   ??   FINDINGS: Three views of the left foot demonstrate no fracture or other acute   osseous or articular abnormality. The soft tissues are within normal limits.   Mild osteophytic changes noted, most prominent at the first metatarsophalangeal   joint.   ??   IMPRESSION   No acute abnormality.           11/28/2019   PROCEDURE PERFORMED:  Left mid superficial femoral artery to proximal peroneal artery bypass using nonreversed saphenous vein.      11/25/2019   PROCEDURE PERFORMED:   1.  Abdominal aortogram.   2.  Left leg runoff arteriogram with third order catheterization.   ??   SURGEON:  Belenda Cruise, MD   FINDINGS:   1.  Aortoiliac arteriogram:  The abdominal aorta and iliac arteries are patent without narrowing.   2.  Left leg runoff arteriogram:  The common femoral, profunda femoris, and superficial femoral arteries are patent.  There is no significant stenosis in the superficial femoral artery.  The SFA is occluded at its distal portion at the junction with  the popliteal artery.  The tibioperoneal trunk reconstitutes with two-vessel posterior tibial and peroneal outflow into the lower leg and foot.  The posterior tibial is a small diffusely  narrow artery.  The peroneal artery is the best runoff vessel.   ??   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  The patient will need bypass surgery for limb salvage.         Noninvasive vascular studies    11/26/2019   Interpretation Summary      ??    Pressure measurements at the ankle are consistent with severe, bilateral, lower extremity arterial obstruction.         Lower Extremity Arterial Findings      ABI        ABI is severely abnormal on the right and the  left.  PVR waveforms at the ankle are moderately to severely abnormal on the right and  left.  PPG waveforms are flat (non-pulsatile) at the right and left great toe.          Results               Procedure  Component  Value  Units  Date/Time           CULTURE, BLOOD, PAIRED [id number]  Collected: 12/07/19 0727            Order Status: Completed  Specimen: Blood  Updated: 12/09/19 0716                Special Requests:  NO SPECIAL REQUESTS              Culture result:  NO GROWTH 2 DAYS                CULTURE, BLOOD, PAIRED [id number]              Order Status: Sent  Specimen: Blood             CULTURE, BLOOD, PAIRED [id number]              Order Status: Sent  Specimen: Blood             RESPIRATORY VIRUS PANEL W/COVID-19, PCR [id number]  Collected: 12/06/19 1125            Order Status: Completed  Specimen: Nasopharyngeal  Updated: 12/06/19 1248                Adenovirus  Not detected              Coronavirus 229E  Not detected              Coronavirus HKU1  Not detected              Coronavirus CVNL63  Not detected              Coronavirus OC43  Not detected              Metapneumovirus  Not detected              Rhinovirus and Enterovirus  Not detected              Influenza A  Not detected              Influenza A, subtype H1  Not detected              Influenza A, subtype H3  Not detected              INFLUENZA A H1N1 PCR  Not detected  Influenza B  Not detected              Parainfluenza 1  Not detected              Parainfluenza 2  Not detected              Parainfluenza 3  Not detected              Parainfluenza virus 4  Not detected              RSV by PCR  Not detected              B. parapertussis, PCR  Not detected              Bordetella pertussis - PCR  Not detected              Chlamydophila pneumoniae DNA, QL, PCR  Not detected              Mycoplasma pneumoniae DNA, QL, PCR  Not detected              SARS-CoV-2, PCR  Not detected                RESPIRATORY VIRUS PANEL W/COVID-19, PCR  [id number]              Order Status: Canceled  Specimen: NASOPHARYNGEAL SWAB             CULTURE, ANAEROBIC [id number]  Collected: 12/05/19 1730            Order Status: Completed  Specimen: Foot, left  Updated: 12/08/19 0920                Special Requests:  NO SPECIAL REQUESTS              Culture result:  NO ANAEROBES ISOLATED                CULTURE, TISSUE Sid Falcon STAIN [id number]  (Abnormal)  (Susceptibility)  Collected: 12/05/19 1730            Order Status: Completed  Specimen: Foot, left  Updated: 12/08/19 0920              Special Requests:  DISTAL PHALANX BONE                GRAM STAIN  RARE GRAM NEGATIVE RODS                    Culture result:                 LIGHT PSEUDOMONAS AERUGINOSA                      Susceptibility           Pseudomonas aeruginosa          MIC          Amikacin ($)  Susceptible          Cefepime ($$)  Susceptible          Ceftazidime ($)  Susceptible          Ciprofloxacin ($)  Susceptible          Gentamicin ($)  Susceptible          Levofloxacin ($)  Susceptible          Meropenem ($$)  Susceptible          Piperacillin/Tazobac ($)  Susceptible [1]  Tobramycin ($)  Susceptible                       [1]     **FDA INTERPRETATION REFLECTED, REFER TO CLSI FOR ALTERNATE INTERPRETATIONS.**                                        COVID-19 RAPID TEST [id number]  Collected: 11/25/19 1025            Order Status: Completed  Specimen: Nasopharyngeal  Updated: 11/25/19 1049                Specimen source  Nasopharyngeal                     COVID-19 rapid test  Not detected                  Comment:  Rapid Abbott ID Now         Rapid NAAT:  The specimen is NEGATIVE for SARS-CoV-2, the novel coronavirus associated with COVID-19.         Negative results should be treated as presumptive and, if inconsistent with clinical signs and symptoms or necessary for patient management, should be tested with an alternative molecular assay.   Negative results do not preclude SARS-CoV-2  infection and should not be used as the sole basis for patient management decisions.         This test has been authorized by the FDA under an Emergency Use Authorization (EUA) for use by authorized laboratories.    Fact sheet for Healthcare Providers: LittleDVDs.dk   Fact sheet for Patients: SatelliteRebate.it         Methodology: Isothermal Nucleic Acid Amplification                                   Labs:     Recent Labs           12/09/19   0253     WBC  6.6     CREA  1.03*     BUN  15     HGB  7.3*     HCT  21.9*     NA  139     K  3.1*     CL  105     CO2  26        GLU  145*

## 2019-12-09 NOTE — Progress Notes (Signed)
 Problem: Mobility Impaired (Adult and Pediatric)  Goal: *Acute Goals and Plan of Care (Insert Text)  Description: FUNCTIONAL STATUS PRIOR TO ADMISSION: Patient was independent and active without use of DME.    HOME SUPPORT PRIOR TO ADMISSION: The patient lived alone with no local support.    Physical Therapy  Reassessed post-surgery 12/06/2019  1.  Patient will move from supine to sit and sit to supine , scoot up and down, and roll side to side in bed with supervision/set-up within 7 day(s).    2.  Patient will transfer from bed to chair and chair to bed with supervision/set-up using the least restrictive device within 7 day(s).  3.  Patient will perform sit to stand with supervision/set-up within 7 day(s).  4.  Patient will ambulate with min A for 50 feet with the least restrictive device within 7 day(s).   5.  Patient will ascend/descend 4 stairs with cane and one handrail(s) with supervision/set-up within 7 day(s).   Goals remain appropriate and will continue to work toward same.      Physical Therapy Goals  Initiated 11/26/2019  1.  Patient will move from supine to sit and sit to supine , scoot up and down, and roll side to side in bed with supervision/set-up within 7 day(s).    2.  Patient will transfer from bed to chair and chair to bed with supervision/set-up using the least restrictive device within 7 day(s).  3.  Patient will perform sit to stand with supervision/set-up within 7 day(s).  4.  Patient will ambulate with supervision/set-up for 50 feet with the least restrictive device within 7 day(s).   5.  Patient will ascend/descend 4 stairs with cane and one handrail(s) with supervision/set-up within 7 day(s).     Outcome: Progressing Towards Goal    PHYSICAL THERAPY TREATMENT  Patient: Lindsey Huynh (69 y.o. 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    ASSESSMENT  Patient continues with skilled PT services and is progressing towards goals. Patient is painful this afternoon limiting mobility. Reviewed PWB with transferring vs NWB for longer distances. Patient is tired but agreeable to up to the chair, using RW appropriately. Encouraged to be up the chair x 1 hour x 2 daily. Reviewed LE exercises.     Current Level of Function Impacting Discharge (mobility/balance): min assist x 1 transfers    Other factors to consider for discharge: Lives alone, previously independent with mobility         PLAN :  Patient continues to benefit from skilled intervention to address the above impairments.  Continue treatment per established plan of care.  to address goals.    Recommendation for discharge: (in order for the patient to meet his/her long term goals)  Therapy 3 hours per day 5-7 days per week    This discharge recommendation:  Has been made in collaboration with the attending provider and/or case management    IF patient discharges home will need the following DME: to be determined (TBD)       SUBJECTIVE:   Patient stated "I will get to the chair if that's what I need to do."    OBJECTIVE DATA SUMMARY:   Critical Behavior:  Neurologic State: Alert  Orientation  Level: Oriented X4  Cognition: Appropriate decision making, Follows commands  Safety/Judgement: Awareness of environment    Functional Mobility Training:  Bed Mobility:  Supine to Sit: Contact guard assistance    Transfers:  Sit to Stand: Minimum assistance;Assist x1;Additional time;Adaptive equipment  Stand to Sit: Minimum assistance;Assist x1  Bed to Chair: Minimum assistance;Assist x1;Additional time;Adaptive equipment    Balance:  Sitting: Intact  Standing: Impaired;With support  Standing - Static: Good;Constant support  Standing - Dynamic : Fair;Constant  support    Therapeutic Exercises:   Reviewed glut sets, quad sets, SLR,     Pain Rating:  Up to 8/10 with mobility    Activity Tolerance:   Fair, requires frequent rest breaks, observed SOB with activity, and requesting pain medication at the end of the session. Nursing aware.    After treatment patient left in no apparent distress:   Sitting in chair and Call bell within reach    COMMUNICATION/COLLABORATION:   The patient's plan of care was discussed with: Registered nurse.     Royetta ONEIDA Sella, PT, DPT  Geriatric Clinical Specialist     Time Calculation: 20 mins

## 2019-12-09 NOTE — Progress Notes (Signed)
I agree with care, assessment and charting on this patient by Astrid Drafts, RN.        Bedside and Verbal shift change report given to Judson Roch, RN (oncoming nurse) by Janett Billow, RN (offgoing nurse). Report included the following information SBAR, Kardex, ED Summary, OR Summary, Procedure Summary, Intake/Output, MAR and Recent Results.

## 2019-12-09 NOTE — Progress Notes (Signed)
Bedside and Verbal shift change report given to [place] (Soil scientist) by Judson Roch (offgoing nurse). Report included the following information SBAR, Kardex, Procedure Summary, Intake/Output and MAR.

## 2019-12-09 NOTE — Progress Notes (Signed)
 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  The extrapolated true trough is approximately 20.54 mcg/mL based on the patient's known kinetics.       Goal trough: 15 - 20 mcg/mL       Plan: Infusion stopped as soon a critical lab resulted; ~750 mg of dose infused. Change to 1000 mg Q 16hrs is predicting a therapeutic trough.  Pharmacy will continue to monitor this patient daily for changes in clinical status and renal function.    Prentice Bray, PharmD  Clinical Pharmacist  [facility] [phone])

## 2019-12-09 NOTE — Progress Notes (Signed)
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  No complaints of chest pain or SOB.  Foot feels better.         Assessment and Plan        1. AV abnormality on echo       11/23/19     ECHO ADULT COMPLETE 12/06/2019 12/06/2019           Narrative  ?? AV: Possible vegetation is present on the aortic valve.   ?? MV: Mitral valve thickening. Mild mitral valve regurgitation is present.   ?? TV: Mild tricuspid valve regurgitation is present.   ?? LV: Estimated LVEF is 55 - 60%. Visually measured ejection fraction.    Normal systolic function (ejection fraction normal). Mild septal wall    hypertrophy. Age-appropriate left ventricular diastolic function.                 Signed by: Telford Nab, MD      - Seen in short axis view. Could be calcification but appears mobile    - responding to broad spectrum antibiotics. For now TEE will not provide additional information.       Valve function is preserved. Will need surveillance cx after antibiotics stopped.       The vegetation looks old, calcified. FU TTE in 4-6 weeks   2.  Left great toe osteomyelitis    - per Podiatry    - IV ABx   3. S/p Fem pop bypass    - Left LE    - Vasc Surg following    - pain control   4. Anemia    - Hgb 7.3 stable    - per Primary team   5. Dm II    - SSI   6. HTN    - controlled on home meds         Doing well.  Plan for her is to go to rehab x 2 wks and then  home.  She will need to follow up in our office for repeat echo.   Will schedule and place in AVS.  Needs no further cardiac testing.  Will see  again as needed.      Saw and evaluated pt and agree with above assessment and plan. Noted echo findings, outpt follow up and echo with Dr. Shelly Rubenstein. No additional cardiac evaluation indicated at this time. Will be available  prn.   Horatio Pel, MD      No results found for: CRES      Past Medical History:        Diagnosis  Date         ?  Diabetes ([place])            insulin + metformin         ?  GERD (gastroesophageal reflux disease)       ?  Hypercholesterolemia           ?  Hypertension             Social History          Tobacco Use         ?  Smoking status:  Former Smoker              Years:  10.00         ?  Smokeless tobacco:  Never Used       Substance Use Topics         ?  Alcohol use:  Yes              Alcohol/week:  5.0 standard drinks         Types:  6 Cans of beer per week         ?  Drug use:  No                Review of Systems:     []  Patient unable to provide secondary to  condition      []  All systems negative, except as checked  below.     Constitutional:     [] Weight Change  [] Fever   []  Chills   [] Night Sweats  [] Fatigue  []  Malaise  [] ____   ENT/Mouth:      [] Hearing Changes  [] Ear Pain  []  Nasal Congestion   [] Sinus Pain  [] Hoarseness    [] Sore throat  [] Rhinorrhea  []  Swallowing Difficulty  [] ____   Eyes:     [] Eye Pain  [] Swelling  []  Redness  [] Foreign Body  [] Discharge  []  Vision Changes  [] ____   Cardiovascular:     [] Chest Pain  [] SOB  []  PND  [] DOE  [] Orthopnea  []  Claudication  [] Edema    [] Palpitations  [] ____   Respiratory:     [] Cough  [] Sputum  []  Wheezing,  [] SOB  [] Hemoptysis  []  ____   Gastrointestinal:     [] Nausea  [] Vomiting  []  Diarrhea  [] Constipation  [] Pain  []  Heartburn  [] Anorexia   [] Dysphagia  [] Hematochezia  []  Melena,  [] Jaundice  [] ____   Genitourinary:     [] Dysuria  [] Urinary Frequency  []  Hematuria  [] Urinary Incontinence   [] Urgency   [] Flank Pain  [] Hesitancy  []  ____   Musculoskeletal:     [] Arthralgias  [] Myalgias  []  Joint Swelling  [] Joint Stiffness  [] Back Pain  []  Neck Pain  [] ____   Skin:     [] Skin Lesions  [] Pruritis  []  Hair Changes  [] Skin rashes  [] ____   Neuro:     [] Weakness  [] Numbness  []  Paresthesias  [] Loss of Consciousness  [] Syncope    [] Dizziness  [] Headache  []   Coordination Changes  [] Recent Falls  [] ____   Psych:     [] Anxiety/Depression  [] Insomnia  []  Memory Changes  [] Violence/Abuse Hx.  [] ____   Heme/Lymph:     [] Bruising  [] Bleeding  []  Lymphadenopathy  [] ____   Endocrine:     [] Polyuria  [] Polydipsia  []  Temperature Intolerance  [] ____              Objective:       Physical Exam:                  Visit Vitals      BP  111/64 (BP 1 Location: Left upper arm, BP Patient Position: At rest)     Pulse  83     Temp  98 ??F (36.7 ??C)     Resp  17     Ht  5\' 5"  (1.651 m)     Wt  169 lb (76.7 kg)     SpO2  99%        BMI  28.12 kg/m??               General Appearance:    Well developed, well nourished,alert and oriented x 3, and    individual in no acute distress.        Ears/Nose/Mouth/Throat:     Hearing grossly normal.            Neck:   Supple.     Chest:     Lungs clear to auscultation bilaterally.        Cardiovascular:    Regular rate and rhythm, S1, S2 normal, no murmur.        Abdomen:     Soft, non-tender, bowel sounds are active.        Extremities:   No edema bilaterally.         Skin:   Warm and dry.        Telemetry:        Data Review:     Labs:       Recent Results (from the past 24 hour(s))     GLUCOSE, POC          Collection Time: 12/08/19  5:10 PM         Result  Value  Ref Range            Glucose (POC)  229 (H)  65 - 100 mg/dL       Performed by  Flonnie Overman (RN)         GLUCOSE, POC          Collection Time: 12/08/19  9:33 PM         Result  Value  Ref Range            Glucose (POC)  129 (H)  65 - 100 mg/dL       Performed by  Astrid Drafts         CBC WITH AUTOMATED DIFF           Collection Time: 12/09/19  2:53 AM         Result  Value  Ref Range            WBC  6.6  3.6 - 11.0 K/uL       RBC  2.34 (L)  3.80 - 5.20 M/uL       HGB  7.3 (L)  11.5 - 16.0 g/dL       HCT  21.9 (L)  35.0 - 47.0 %       MCV  93.6  80.0 - 99.0 FL       MCH  31.2  26.0 - 34.0 PG       MCHC  33.3  30.0 - 36.5 g/dL       RDW  13.3  11.5 - 14.5 %       PLATELET  274  150 - 400 K/uL       MPV  10.0  8.9 - 12.9 FL       NRBC  0.0  0 PER 100 WBC       ABSOLUTE NRBC  0.00  0.00 - 0.01 K/uL       NEUTROPHILS  71  32 - 75 %       LYMPHOCYTES  7 (L)  12 - 49 %       MONOCYTES  7  5 - 13 %       EOSINOPHILS  13 (H)  0 - 7 %       BASOPHILS  1  0 - 1 %       IMMATURE GRANULOCYTES  1 (H)  0.0 - 0.5 %       ABS. NEUTROPHILS  4.5  1.8 - 8.0 K/UL       ABS. LYMPHOCYTES  0.5 (L)  0.8 - 3.5 K/UL       ABS. MONOCYTES  0.5  0.0 - 1.0 K/UL       ABS. EOSINOPHILS  0.9 (H)  0.0 - 0.4 K/UL       ABS. BASOPHILS  0.1  0.0 - 0.1 K/UL       ABS. IMM. GRANS.  0.1 (H)  0.00 - 0.04 K/UL       DF  SMEAR SCANNED          RBC COMMENTS  OVALOCYTES   1+             RBC COMMENTS  POLYCHROMASIA   PRESENT             RBC COMMENTS  ATYPICAL LYMPHOCYTES PRESENT          METABOLIC PANEL, BASIC          Collection Time: 12/09/19  2:53 AM         Result  Value  Ref Range            Sodium  139  136 - 145 mmol/L       Potassium  3.1 (L)  3.5 - 5.1 mmol/L       Chloride  105  97 - 108 mmol/L       CO2  26  21 - 32 mmol/L       Anion gap  8  5 - 15 mmol/L       Glucose  145 (H)  65 - 100 mg/dL       BUN  15  6 - 20 MG/DL       Creatinine  1.03 (H)  0.55 - 1.02 MG/DL       BUN/Creatinine ratio  15  12 - 20         GFR est AA  >60  >60 ml/min/1.72m2       GFR est non-AA  53 (L)  >60 ml/min/1.77m2       Calcium  9.0  8.5 - 10.1 MG/DL       GLUCOSE, POC  Collection Time: 12/09/19  6:24 AM         Result  Value  Ref Range            Glucose (POC)  130 (H)  65 - 100 mg/dL       Performed by  Abram Sander, TROUGH          Collection Time:  12/09/19  9:43 AM         Result  Value  Ref Range            Vancomycin,trough  21.1 (HH)  5.0 - 10.0 ug/mL       Reported dose date  NOT PROVIDED          Reported dose time:  NOT PROVIDED          Reported dose:  NOT PROVIDED  UNITS       GLUCOSE, POC          Collection Time: 12/09/19 11:39 AM         Result  Value  Ref Range            Glucose (POC)  175 (H)  65 - 100 mg/dL            Performed by  Sandi Raveling                Radiology:             Current Facility-Administered Medications          Medication  Dose  Route  Frequency           ?  potassium chloride SR (KLOR-CON 10) tablet 40 mEq   40 mEq  Oral  BID     ?  lactulose (CHRONULAC) 10 gram/15 mL solution 30 mL   20 g  Oral  BID     ?  HYDROmorphone (PF) (DILAUDID) injection 0.5 mg   0.5 mg  IntraVENous  Q4H PRN     ?  alteplase (CATHFLO) 1 mg in sterile water (preservative free) 1 mL injection   1 mg  InterCATHeter  PRN     ?  oxyCODONE IR (ROXICODONE) tablet 10 mg   10 mg  Oral  Q4H PRN     ?  cefepime (MAXIPIME) 2 g in 0.9% sodium chloride (MBP/ADV) 100 mL MBP   2 g  IntraVENous  Q8H     ?  insulin glargine (LANTUS) injection 12 Units   12 Units  SubCUTAneous  QHS     ?  acetaminophen (TYLENOL) tablet 650 mg   650 mg  Oral  Q6H     ?  gabapentin (NEURONTIN) capsule 300 mg   300 mg  Oral  QHS     ?  gabapentin (NEURONTIN) capsule 100 mg   100 mg  Oral  DAILY     ?  sodium chloride (NS) flush 5-40 mL   5-40 mL  IntraVENous  Q8H     ?  sodium chloride (NS) flush 5-40 mL   5-40 mL  IntraVENous  PRN     ?  sodium phosphate (FLEET'S) enema 1 Enema   1 Enema  Rectal  PRN     ?  oxyCODONE IR (ROXICODONE) tablet 5 mg   5 mg  Oral  Q4H PRN     ?  losartan (COZAAR) tablet 100 mg   100 mg  Oral  DAILY     ?  docusate sodium (COLACE) capsule 100 mg   100 mg  Oral  BID     ?  senna (SENOKOT) tablet 8.6 mg   1 Tab  Oral  DAILY     ?  polyethylene glycol (MIRALAX) packet 17 g   17 g  Oral  DAILY PRN     ?  bisacodyL (DULCOLAX) suppository 10 mg   10 mg  Rectal   DAILY PRN     ?  hydroCHLOROthiazide (HYDRODIURIL) tablet 25 mg   25 mg  Oral  DAILY     ?  sodium chloride (NS) flush 5-40 mL   5-40 mL  IntraVENous  Q8H     ?  sodium chloride (NS) flush 5-40 mL   5-40 mL  IntraVENous  PRN     ?  acetaminophen (TYLENOL) suppository 650 mg   650 mg  Rectal  Q6H PRN     ?  promethazine (PHENERGAN) tablet 12.5 mg   12.5 mg  Oral  Q6H PRN          Or           ?  ondansetron (ZOFRAN) injection 4 mg   4 mg  IntraVENous  Q6H PRN     ?  glucose chewable tablet 16 g   4 Tab  Oral  PRN     ?  dextrose (D50W) injection syrg 12.5-25 g   25-50 mL  IntraVENous  PRN     ?  glucagon (GLUCAGEN) injection 1 mg   1 mg  IntraMUSCular  PRN     ?  insulin lispro (HUMALOG) injection     SubCUTAneous  AC&HS     ?  aspirin delayed-release tablet 81 mg   81 mg  Oral  DAILY     ?  pantoprazole (PROTONIX) tablet 40 mg   40 mg  Oral  ACB     ?  amLODIPine (NORVASC) tablet 10 mg   10 mg  Oral  DAILY           ?  enoxaparin (LOVENOX) injection 40 mg   40 mg  SubCUTAneous  Q24H           Tyler R. Nadara Mustard, PA-C   Vevelyn Pat, MD         [facility] [place], [place] [id number]    [place], [place]    [phone]

## 2019-12-09 NOTE — Progress Notes (Signed)
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,  pathology proximal margin pending     Post operative recommendations:   Pt can partial weight bear for short distances <5 steps in surgical shoe left foot  Dressings with adaptic, betadine, dry sterile dressing left foot, betadine and bandaid to ulcerations left calf; change 2-3 x /week  Surgical site appears to be healing with adequate perfusion  Pt is stable for discharge from a podiatry standpoint- planning d/c to SNF, pending proximal margin from pathology and final ID recommendations  Pt should f/u with me 1 week after discharge for close post op f/u    Do not hesitate to contact us via [employer] or phone (personal [phone]) with any questions.    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  Musculoskeletal: no joint / extremity pain, misalignment, stiffness, decreased ROM, crepitus.  Integument: No pruritis, rashes, lesions, left foot and leg wounds.  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 Neuromuscular Blockers, Steroidal Other (comments)     GI Upset     History reviewed. No pertinent family history.   @HXMEDICAL @  Past Surgical History:   Procedure Laterality Date   ??? HX COLONOSCOPY     ??? HX ORTHOPAEDIC  06-30-10    back surgery (tumor removed)   ??? 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 Smoking status: Former Smoker     Years: 10.00   ??? Smokeless tobacco: Never Used   Substance Use Topics   ??? Alcohol use: Yes     Alcohol/week: 5.0 standard drinks     Types: 6 Cans of beer per week       Social History     Substance and Sexual Activity   Alcohol Use Yes   ??? Alcohol/week: 5.0 standard drinks   ??? Types: 6 Cans of beer per week     Social History     Substance and Sexual Activity   Drug Use No      Social History     Tobacco Use  Smoking Status Former Smoker   ??? Years: 10.00   Smokeless Tobacco Never Used     Current Facility-Administered Medications   Medication Dose Route Frequency   ??? lactulose (CHRONULAC) 10 gram/15 mL solution 30 mL  20 g Oral BID   ??? HYDROmorphone (PF) (DILAUDID) injection 0.5 mg  0.5 mg IntraVENous Q4H PRN   ??? alteplase (CATHFLO) 1 mg in sterile water (preservative free) 1 mL injection  1 mg InterCATHeter PRN   ??? oxyCODONE IR (ROXICODONE) tablet 10 mg  10 mg Oral Q4H PRN   ??? cefepime (MAXIPIME) 2 g in 0.9% sodium chloride (MBP/ADV) 100 mL MBP  2 g IntraVENous Q8H   ??? insulin glargine (LANTUS) injection 12 Units  12 Units SubCUTAneous QHS   ??? vancomycin (VANCOCIN) 1250 mg in NS 250 ml infusion  1,250 mg IntraVENous Q16H   ??? acetaminophen (TYLENOL) tablet 650 mg  650 mg Oral Q6H   ??? gabapentin (NEURONTIN) capsule 300 mg  300 mg Oral QHS   ??? gabapentin (NEURONTIN) capsule 100 mg  100 mg Oral DAILY   ??? Vancomycin- Pharmacy to Dose   Other Rx Dosing/Monitoring   ??? metroNIDAZOLE (FLAGYL) IVPB premix 500 mg  500 mg IntraVENous Q12H   ??? sodium chloride (NS) flush 5-40 mL  5-40 mL IntraVENous  Q8H   ??? sodium chloride (NS) flush 5-40 mL  5-40 mL IntraVENous PRN   ??? sodium phosphate (FLEET'S) enema 1 Enema  1 Enema Rectal PRN   ??? oxyCODONE IR (ROXICODONE) tablet 5 mg  5 mg Oral Q4H PRN   ??? losartan (COZAAR) tablet 100 mg  100 mg Oral DAILY   ??? docusate sodium (COLACE) capsule 100 mg  100 mg Oral BID   ??? senna (SENOKOT) tablet 8.6 mg  1 Tab Oral DAILY   ??? polyethylene glycol (MIRALAX) packet 17 g  17 g Oral DAILY PRN   ??? bisacodyL (DULCOLAX) suppository 10 mg  10 mg Rectal DAILY PRN   ??? hydroCHLOROthiazide (HYDRODIURIL) tablet 25 mg  25 mg Oral DAILY   ??? sodium chloride (NS) flush 5-40 mL  5-40 mL IntraVENous Q8H   ??? sodium chloride (NS) flush 5-40 mL  5-40 mL IntraVENous PRN   ??? acetaminophen (TYLENOL) suppository 650 mg  650 mg Rectal Q6H PRN   ??? promethazine (PHENERGAN) tablet 12.5 mg  12.5 mg Oral Q6H PRN    Or   ??? ondansetron (ZOFRAN) injection 4 mg  4 mg IntraVENous Q6H PRN   ??? glucose chewable tablet 16 g  4 Tab Oral PRN   ??? dextrose (D50W) injection syrg 12.5-25 g  25-50 mL IntraVENous PRN   ??? glucagon (GLUCAGEN) injection 1 mg  1 mg IntraMUSCular PRN   ??? insulin lispro (HUMALOG) injection   SubCUTAneous AC&HS   ??? aspirin delayed-release tablet 81 mg  81 mg Oral DAILY   ??? pantoprazole (PROTONIX) tablet 40 mg  40 mg Oral ACB   ??? amLODIPine (NORVASC) tablet 10 mg  10 mg Oral DAILY   ??? enoxaparin (LOVENOX) injection 40 mg  40 mg SubCUTAneous Q24H        Objective:  Vitals:   Patient Vitals for the past 12 hrs:   BP Temp Pulse Resp SpO2   12/09/19 0803 111/64 98 ??F (36.7 ??C) 83 17 99 %   12/09/19 0222 106/67 98.6 ??F (37 ??C) 69 16 96 %       Vascular:  B/L LE  DP 0/4; PT 0/4  capillary fill time brisk, pitting  edema is present, skin temperature is cool, varicosities are present.    Dermatological:  Nails are thickened, discolored, painful to palpation, 20mm thick, with subungual debris.          There is no maceration of the interspaces of the feet b/l.  No focal hyperkeratosis    Wound: 1   Location:  surgical incisions left forefoot and posterior Achilles  Margins: approximated with sutures intact  Drainage: none  Odor: none  Wound base: skin edges granular  Lymphangitic streaking? No.  Undermining? No.  Sinus tracts? No.  Exposed bone? No.  Subcutaneous crepitation on palpation? No.      Neurological:  DTR are present, protective sensation per 5.07 Semmes Weinstein monofilament is absent, patient is AAOx3, mood is normal. Epicritic sensation is intact.    Orthopedic:  B/L LE are symmetric, ROM of ankle, STJ, 1st MTPJ is limited, MMT 5 out of 5 for B/L LE.  No pedal amputations noted    Constitutional: Pt is a well developed elderly female.        Imaging:  Exam Information    Status Exam Begun  Exam Ended    Final [99] 12/05/2019 21:04 12/05/2019 ??9:08 PM [id number] ??9:08 PM   Result Information    Status: Final result (Exam End: 12/05/2019 21:08) Provider Status: Open   Study Result    EXAM: XR FOOT LT AP/LAT  ??  INDICATION: post op xray s/p transmetatarsal amputation.  ??  COMPARISON: None.  ??  FINDINGS: Two views of the left foot demonstrate transmetatarsal amputation. No  evidence of immediate complication.  ??  IMPRESSION  1. Transmetatarsal amputation.     Exam Information    Status Exam Begun  Exam Ended    Final [99] 11/29/2019 20:49 11/29/2019 ??9:17 PM [id number] ??9:17 PM   Result Information    Status: Final result (Exam End: 11/29/2019 21:17) Provider Status: Open   Study Result    PRELIMINARY REPORT  Soft tissue defect on the dorsal aspect of the first distal phalanx with  underlying marrow edema as seen on the STIR images.  ??  ??  Preliminary report was provided by Dr. Art Buff, the on-call radiologist, at  21:41 hours on 11/29/2019  ??  Final report to follow.  ??  FINAL REPORT BELOW  ??  EXAM:  MRI LOW EXT LT WO CONT  ??  INDICATION: Left foot infection. Diabetes.  ??  COMPARISON: CT left foot on 11/23/2019.  ??  TECHNIQUE: Axial, coronal, and sagittal MRI of the left forefoot in the T1 and  inversion recovery pulse  sequences with and without fat saturation .  ??  CONTRAST: None.  ??  FINDINGS:   ??  Bone marrow: Subtle cortical loss of the great toe distal phalanx. Bone marrow  edema is in the great toe distal phalanx.  ??  Remaining bone marrow signal is heterogeneous but within normal limits. No acute  fracture or osteonecrosis.  ??  Joint fluid: Physiologic.  ??  Tendons: Intact.  ??  Muscles: Mild atrophy. Moderate edema.  ??  Neurovascular bundles: No neuroma. Mild first and third intermetatarsal  bursitis.  ??  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 No septic  arthritis. Normal Lisfranc joint.  ??  Soft tissue mass: Mild cellulitis of the toes. No abscess.  ??  IMPRESSION  ??  1. First distal phalanx osteomyelitis.  2. No abscess.       Exam Information    Status Exam Begun  Exam  Ended    Final [99] 11/23/2019 12:48 11/23/2019 12:52 PM [id number] 12:52 PM   Result Information    Status: Final result (Exam End: 11/23/2019 12:52) Provider Status: Open   Study Result    EXAM: XR FOOT LT MIN 3 V  ??  INDICATION: left foot pain.  ??  COMPARISON: None.  ??  FINDINGS: Three views of the left foot demonstrate no fracture or other acute  osseous or articular abnormality. The soft tissues are within normal limits.  Mild osteophytic changes noted, most prominent at the first metatarsophalangeal  joint.  ??  IMPRESSION  No acute abnormality.       11/28/2019  PROCEDURE PERFORMED:  Left mid superficial femoral artery to proximal peroneal artery bypass using nonreversed saphenous vein.    11/25/2019  PROCEDURE PERFORMED:  1.  Abdominal aortogram.  2.  Left leg runoff arteriogram with third order catheterization.  ??  SURGEON:  Belenda Cruise, MD  FINDINGS:  1.  Aortoiliac arteriogram:  The abdominal aorta and iliac arteries are patent without narrowing.  2.  Left leg runoff arteriogram:  The common femoral, profunda femoris, and superficial femoral arteries are patent.  There is no significant stenosis in the superficial femoral  artery.  The SFA is occluded at its distal portion at the junction with the popliteal artery.  The tibioperoneal trunk reconstitutes with two-vessel posterior tibial and peroneal outflow into the lower leg and foot.  The posterior tibial is a small diffusely narrow artery.  The peroneal artery is the best runoff vessel.  ??  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  The patient will need bypass surgery for limb salvage.      Noninvasive vascular studies   11/26/2019  Interpretation Summary    ?? Pressure measurements at the ankle are consistent with severe, bilateral, lower extremity arterial obstruction.      Lower Extremity Arterial Findings    ABI    ABI is severely abnormal on the right and the left.  PVR waveforms at the ankle are moderately to severely abnormal on the right and left.  PPG waveforms are flat (non-pulsatile) at the right and left great toe.     Results     Procedure Component Value Units Date/Time    CULTURE, BLOOD, PAIRED [id number] Collected: 12/07/19 0727    Order Status: Completed Specimen: Blood Updated: 12/09/19 0716     Special Requests: NO SPECIAL REQUESTS        Culture result: NO GROWTH 2 DAYS       CULTURE, BLOOD, PAIRED [id number]     Order Status: Sent Specimen: Blood     CULTURE, BLOOD, PAIRED [id number]     Order Status: Sent Specimen: Blood     RESPIRATORY VIRUS PANEL W/COVID-19, PCR [id number] Collected: 12/06/19 1125    Order Status: Completed Specimen: Nasopharyngeal Updated: 12/06/19 1248     Adenovirus Not detected        Coronavirus 229E Not detected        Coronavirus HKU1 Not detected        Coronavirus CVNL63 Not detected        Coronavirus OC43 Not detected        Metapneumovirus Not detected        Rhinovirus and Enterovirus Not detected        Influenza A Not detected        Influenza A, subtype H1 Not detected  Influenza A, subtype H3 Not detected        INFLUENZA A H1N1  PCR Not detected        Influenza B Not detected        Parainfluenza 1 Not detected        Parainfluenza 2 Not detected        Parainfluenza 3 Not detected        Parainfluenza virus 4 Not detected        RSV by PCR Not detected        B. parapertussis, PCR Not detected        Bordetella pertussis - PCR Not detected        Chlamydophila pneumoniae DNA, QL, PCR Not detected        Mycoplasma pneumoniae DNA, QL, PCR Not detected        SARS-CoV-2, PCR Not detected       RESPIRATORY VIRUS PANEL W/COVID-19, PCR [id number]     Order Status: Canceled Specimen: NASOPHARYNGEAL SWAB     CULTURE, ANAEROBIC [id number] Collected: 12/05/19 1730    Order Status: Completed Specimen: Foot, left Updated: 12/08/19 0920     Special Requests: NO SPECIAL REQUESTS        Culture result: NO ANAEROBES ISOLATED       CULTURE, TISSUE Sid Falcon STAIN [id number]  (Abnormal)  (Susceptibility) Collected: 12/05/19 1730    Order Status: Completed Specimen: Foot, left Updated: 12/08/19 0920     Special Requests: DISTAL PHALANX BONE     GRAM STAIN RARE GRAM NEGATIVE RODS        Culture result:       LIGHT PSEUDOMONAS AERUGINOSA          Susceptibility      Pseudomonas aeruginosa     MIC     Amikacin ($) Susceptible     Cefepime ($$) Susceptible     Ceftazidime ($) Susceptible     Ciprofloxacin ($) Susceptible     Gentamicin ($) Susceptible     Levofloxacin ($) Susceptible     Meropenem ($$) Susceptible     Piperacillin/Tazobac ($) Susceptible [1]      Tobramycin ($) Susceptible            [1]   **FDA INTERPRETATION REFLECTED, REFER TO CLSI FOR ALTERNATE INTERPRETATIONS.**                 COVID-19 RAPID TEST [id number] Collected: 11/25/19 1025    Order Status: Completed Specimen: Nasopharyngeal Updated: 11/25/19 1049     Specimen source Nasopharyngeal        COVID-19 rapid test Not detected        Comment: Rapid Abbott ID Now       Rapid NAAT:  The specimen is NEGATIVE for SARS-CoV-2, the novel coronavirus associated with COVID-19.       Negative  results should be treated as presumptive and, if inconsistent with clinical signs and symptoms or necessary for patient management, should be tested with an alternative molecular assay.  Negative results do not preclude SARS-CoV-2 infection and should not be used as the sole basis for patient management decisions.       This test has been authorized by the FDA under an Emergency Use Authorization (EUA) for use by authorized laboratories.   Fact sheet for Healthcare Providers: LittleDVDs.dk  Fact sheet for Patients: SatelliteRebate.it       Methodology: Isothermal Nucleic Acid Amplification  Labs:  Recent Labs     12/09/19  0253   WBC 6.6   CREA 1.03*   BUN 15   HGB 7.3*   HCT 21.9*   NA 139   K 3.1*   CL 105   CO2 26   GLU 145*

## 2019-12-09 NOTE — Progress Notes (Addendum)
[facility]ist [facility]                                                                                          Hospitalist Progress Note  Lindsey Beagle, MD  Answering service: [phone] OR [phone] from in house phone        Date of Service:  12/09/2019  NAME:  Lindsey Huynh  DOB:  1951-06-02  MRN:  [id number]      Admission Summary:   From H&P 11/23/2019:  "Lindsey Huynh??is a 69 y.o.??female??with past medical history of diabetes, insulin-dependent, GERD and hypertension comes for return of left foot pain and swelling.    Interval history / Subjective:     F/U for left foot cellulitis, left great toe gangrene, DMII, HTN, GERD, Hypokalemia, left popliteal occlusion s/p left fem-pop bypass  S/P TMA POD 4 seen by podiatry and pt is  cleared for d/c surgical path report unavailable       Assessment & Plan:     Left great toe Osteomyelitis   PAD: Status post femoropopliteal bypass on November 28, 2019    - MRI LLE: 1. First distal phalanx osteomyelitis.  - IV antibiotics PER ID light pseudomonas and will cont cefepime  - Vascular surgery following- Patient to follow up in 2 weeks post- op for staple removal.   - Podiatry following -TMA POD 2    LLE pain:??dry gangrene??of??left 2nd toe cellulitic changes/Left popliteal occlusion  -s/p left fem-pop bypass (03/11)  -CT LLE (03/06) NL  -Elevated Sed rate and CRP  -BC (03/06) NGTD   -Pain control  - MRI - 1. First distal phalanx osteomyelitis.. No abscess.s/p amputation  ??  Anemia   Stable, No sign of bleeding noted. hgb dropped   - hgb 7.2 today - Monitor labs and transfuse if hgb <7.0   ??  DMII:??hold metformin while IP  -Cont basal insulin, SSI  -Diabetic diet  -a1c 8.1  - DM following??  ??  HTN  -Controlled  -Cont home meds   ??  GERD  -Cont PPI  ??  Hypokalemia  -potassium 3.2 replete PRN    Code status: Full   DVT prophylaxis: Lovenox     Care Plan discussed with: Patient/Family, Nurse and Case Manager  Anticipated Disposition: ready for d/c    [facility]ticipated Discharge: [facility]      Hospital Problems  Date Reviewed: 06-26-18          Codes Class Noted POA    Gangrene ([place]) ICD-10-CM: [id number]  ICD-9-CM: 785.4  11/23/2019 Unknown                Review of Systems:   A comprehensive review of systems was negative except for that written in the HPI.     Xr Foot Lt Ap/lat    Result Date: 12/05/2019  1. Transmetatarsal amputation.    Xr Foot Lt Min 3 V    Result Date: 11/23/2019  No acute abnormality.    Mri Low Ext Lt Wo Cont    Result Date: 12/01/2019  1. First distal phalanx osteomyelitis. 2. No abscess.  Ct Low Ext Lt W Cont    Result Date: 11/24/2019  No CT imaging evidence for soft tissue gas, abscess or osteomyelitis.    Retail banker Technologist Service    Result Date: 11/25/2019  FLUOROSCOPY WAS USED. Fluoro Dose:  144.09 mGy. vk    Vital Signs:    Last 24hrs VS reviewed since prior progress note. Most recent are:  Visit Vitals  BP 111/64 (BP 1 Location: Left upper arm, BP Patient Position: At rest)   Pulse 83   Temp 98 ??F (36.7 ??C)   Resp 17   Ht 5\' 5"  (1.651 m)   Wt 76.7 kg (169 lb)   SpO2 99%   BMI 28.12 kg/m??         Intake/Output Summary (Last 24 hours) at 12/09/2019 1109  Last data filed at 12/09/2019 0025  Gross per 24 hour   Intake ???   Output 300 ml   Net -300 ml        Physical Examination:             Constitutional:  No acute distress, cooperative   ENT:  Oral mucosa moist, oropharynx benign.    Resp:  CTA bilaterally.   CV:  Regular rhythm, normal rate,     GI:  Soft, non distended, non tender.bs+    Musculoskeletal:  LLE dressing c/d  staples intact    Neurologic:  Moves all extremities.  AAOx3, CN II-XII reviewed, follows commands     Psych:  Good insight, Not anxious nor agitated.     ??            Data Review:    Review and/or order of clinical lab test  Review and/or order of tests in the medicine section of CPT      Labs:     Recent Labs     12/09/19  0253 12/08/19  0118   WBC 6.6 7.5   HGB 7.3* 7.2*   HCT 21.9* 21.3*   PLT 274 312     Recent Labs      12/09/19  0253 12/08/19  0118 12/07/19  0436   NA 139 136 135*   K 3.1* 3.2* 3.8   CL 105 102 104   CO2 26 28 26    BUN 15 15 15    CREA 1.03* 0.84 0.79   GLU 145* 123* 119*   CA 9.0 9.0 9.3     No results for input(s): ALT, AP, TBIL, TBILI, TP, ALB, GLOB, GGT, AML, LPSE in the last 72 hours.    No lab exists for component: SGOT, GPT, AMYP, HLPSE  No results for input(s): INR, PTP, APTT, INREXT, INREXT in the last 72 hours.   No results for input(s): FE, TIBC, PSAT, FERR in the last 72 hours.   No results found for: FOL, RBCF   No results for input(s): PH, PCO2, PO2 in the last 72 hours.  No results for input(s): CPK, CKNDX, TROIQ in the last 72 hours.    No lab exists for component: CPKMB  Lab Results   Component Value Date/Time    Cholesterol, total 219 (H) 10/30/2018 10:27 AM    HDL Cholesterol 63 10/30/2018 10:27 AM    LDL, calculated 130 (H) 10/30/2018 10:27 AM    Triglyceride 131 10/30/2018 10:27 AM    CHOL/HDL Ratio 3.4 12/26/2007 09:12 AM     Lab Results   Component Value Date/Time    Glucose (POC) 130 (H) 12/09/2019 06:24 AM  Glucose (POC) 129 (H) 12/08/2019 09:33 PM    Glucose (POC) 229 (H) 12/08/2019 05:10 PM    Glucose (POC) 179 (H) 12/08/2019 11:48 AM    Glucose (POC) 131 (H) 12/08/2019 06:47 AM     No results found for: COLOR, APPRN, SPGRU, REFSG, PHU, PROTU, GLUCU, KETU, BILU, UROU, NITU, LEUKU, GLUKE, EPSU, BACTU, WBCU, RBCU, CASTS, UCRY      Medications Reviewed:     Current Facility-Administered Medications   Medication Dose Route Frequency   ??? [START ON 12/10/2019] vancomycin (VANCOCIN) 1,000 mg in 0.9% sodium chloride 250 mL (VIAL-MATE)  1,000 mg IntraVENous Q16H   ??? potassium chloride SR (KLOR-CON 10) tablet 40 mEq  40 mEq Oral BID   ??? lactulose (CHRONULAC) 10 gram/15 mL solution 30 mL  20 g Oral BID   ??? HYDROmorphone (PF) (DILAUDID) injection 0.5 mg  0.5 mg IntraVENous Q4H PRN   ??? alteplase (CATHFLO) 1 mg in sterile water (preservative free) 1 mL injection  1 mg InterCATHeter PRN   ??? oxyCODONE  IR (ROXICODONE) tablet 10 mg  10 mg Oral Q4H PRN   ??? cefepime (MAXIPIME) 2 g in 0.9% sodium chloride (MBP/ADV) 100 mL MBP  2 g IntraVENous Q8H   ??? insulin glargine (LANTUS) injection 12 Units  12 Units SubCUTAneous QHS   ??? acetaminophen (TYLENOL) tablet 650 mg  650 mg Oral Q6H   ??? gabapentin (NEURONTIN) capsule 300 mg  300 mg Oral QHS   ??? gabapentin (NEURONTIN) capsule 100 mg  100 mg Oral DAILY   ??? Vancomycin- Pharmacy to Dose   Other Rx Dosing/Monitoring   ??? metroNIDAZOLE (FLAGYL) IVPB premix 500 mg  500 mg IntraVENous Q12H   ??? sodium chloride (NS) flush 5-40 mL  5-40 mL IntraVENous Q8H   ??? sodium chloride (NS) flush 5-40 mL  5-40 mL IntraVENous PRN   ??? sodium phosphate (FLEET'S) enema 1 Enema  1 Enema Rectal PRN   ??? oxyCODONE IR (ROXICODONE) tablet 5 mg  5 mg Oral Q4H PRN   ??? losartan (COZAAR) tablet 100 mg  100 mg Oral DAILY   ??? docusate sodium (COLACE) capsule 100 mg  100 mg Oral BID   ??? senna (SENOKOT) tablet 8.6 mg  1 Tab Oral DAILY   ??? polyethylene glycol (MIRALAX) packet 17 g  17 g Oral DAILY PRN   ??? bisacodyL (DULCOLAX) suppository 10 mg  10 mg Rectal DAILY PRN   ??? hydroCHLOROthiazide (HYDRODIURIL) tablet 25 mg  25 mg Oral DAILY   ??? sodium chloride (NS) flush 5-40 mL  5-40 mL IntraVENous Q8H   ??? sodium chloride (NS) flush 5-40 mL  5-40 mL IntraVENous PRN   ??? acetaminophen (TYLENOL) suppository 650 mg  650 mg Rectal Q6H PRN   ??? promethazine (PHENERGAN) tablet 12.5 mg  12.5 mg Oral Q6H PRN    Or   ??? ondansetron (ZOFRAN) injection 4 mg  4 mg IntraVENous Q6H PRN   ??? glucose chewable tablet 16 g  4 Tab Oral PRN   ??? dextrose (D50W) injection syrg 12.5-25 g  25-50 mL IntraVENous PRN   ??? glucagon (GLUCAGEN) injection 1 mg  1 mg IntraMUSCular PRN   ??? insulin lispro (HUMALOG) injection   SubCUTAneous AC&HS   ??? aspirin delayed-release tablet 81 mg  81 mg Oral DAILY   ??? pantoprazole (PROTONIX) tablet 40 mg  40 mg Oral ACB   ??? amLODIPine (NORVASC) tablet 10 mg  10 mg Oral DAILY   ??? enoxaparin (LOVENOX) injection 40 mg  40 mg SubCUTAneous Q24H     ______________________________________________________________________  EXPECTED LENGTH OF STAY: 4d 2h  ACTUAL LENGTH OF STAY:          [place], MD

## 2019-12-09 NOTE — Progress Notes (Addendum)
Cardiology Progress Note            Admit Date: 11/23/2019  Admit Diagnosis: Gangrene ([place]) [I96]  Date: 12/09/2019     Time: 1:37 PM    Subjective:  Feeling well.  No complaints of chest pain or SOB.  Foot feels better.     Assessment and Plan     1. AV abnormality on echo    11/23/19   ECHO ADULT COMPLETE 12/06/2019 12/06/2019    Narrative ?? AV: Possible vegetation is present on the aortic valve.  ?? MV: Mitral valve thickening. Mild mitral valve regurgitation is present.  ?? TV: Mild tricuspid valve regurgitation is present.  ?? LV: Estimated LVEF is 55 - 60%. Visually measured ejection fraction.   Normal systolic function (ejection fraction normal). Mild septal wall   hypertrophy. Age-appropriate left ventricular diastolic function.        Signed by: Telford Nab, MD    - Seen in short axis view. Could be calcification but appears mobile   - responding to broad spectrum antibiotics. For now TEE will not provide additional information.      Valve function is preserved. Will need surveillance cx after antibiotics stopped.      The vegetation looks old, calcified. FU TTE in 4-6 weeks  2.  Left great toe osteomyelitis   - per Podiatry   - IV ABx  3. S/p Fem pop bypass   - Left LE   - Vasc Surg following   - pain control  4. Anemia   - Hgb 7.3 stable   - per Primary team  5. Dm II   - SSI  6. HTN   - controlled on home meds      Doing well.  Plan for her is to go to rehab x 2 wks and then home.  She will need to follow up in our office for repeat echo.  Will schedule and place in AVS.  Needs no further cardiac testing.  Will see again as needed.    Saw and evaluated pt and agree with above assessment and plan. Noted echo findings, outpt follow up and echo with Dr. Shelly Rubenstein. No additional cardiac evaluation indicated at this time. Will be available prn.  Horatio Pel, MD    No results found for: CRES   Past Medical History:   Diagnosis Date   ???  Diabetes ([place])     insulin + metformin   ??? GERD (gastroesophageal reflux disease)    ??? Hypercholesterolemia    ??? Hypertension       Social History     Tobacco Use   ??? Smoking status: Former Smoker     Years: 10.00   ??? Smokeless tobacco: Never Used   Substance Use Topics   ??? Alcohol use: Yes     Alcohol/week: 5.0 standard drinks     Types: 6 Cans of beer per week   ??? Drug use: No           Review of Systems:    []  Patient unable to provide secondary to condition    []  All systems negative, except as checked below.  Constitutional:    [] Weight Change  [] Fever   [] Chills   [] Night Sweats  [] Fatigue  [] Malaise  [] ____  ENT/Mouth:     [] Hearing Changes  [] Ear Pain  [] Nasal Congestion   [] Sinus Pain  [] Hoarseness   [] Sore throat  [] Rhinorrhea  [] Swallowing Difficulty  [] ____  Eyes:    [] Eye  Pain  [] Swelling  [] Redness  [] Foreign Body  [] Discharge  [] Vision Changes  [] ____  Cardiovascular:    [] Chest Pain  [] SOB  [] PND  [] DOE  [] Orthopnea  [] Claudication  [] Edema   [] Palpitations  [] ____  Respiratory:    [] Cough  [] Sputum  [] Wheezing,  [] SOB  [] Hemoptysis  [] ____  Gastrointestinal:    [] Nausea  [] Vomiting  [] Diarrhea  [] Constipation  [] Pain  [] Heartburn  [] Anorexia  [] Dysphagia  [] Hematochezia  [] Melena,  [] Jaundice  [] ____  Genitourinary:    [] Dysuria  [] Urinary Frequency  [] Hematuria  [] Urinary Incontinence  [] Urgency  [] Flank Pain  [] Hesitancy  [] ____  Musculoskeletal:    [] Arthralgias  [] Myalgias  [] Joint Swelling  [] Joint Stiffness  [] Back Pain  [] Neck Pain  [] ____  Skin:    [] Skin Lesions  [] Pruritis  [] Hair Changes  [] Skin rashes  [] ____  Neuro:    [] Weakness  [] Numbness  [] Paresthesias  [] Loss of Consciousness  [] Syncope   [] Dizziness  [] Headache  [] Coordination Changes  [] Recent Falls  [] ____  Psych:    [] Anxiety/Depression  [] Insomnia  [] Memory Changes  [] Violence/Abuse Hx.  [] ____  Heme/Lymph:    [] Bruising  [] Bleeding  [] Lymphadenopathy  [] ____  Endocrine:    [] Polyuria  [] Polydipsia  [] Temperature Intolerance   [] ____         Objective:     Physical Exam:                Visit Vitals  BP 111/64 (BP 1 Location: Left upper arm, BP Patient Position: At rest)   Pulse 83   Temp 98 ??F (36.7 ??C)   Resp 17   Ht 5\' 5"  (1.651 m)   Wt 169 lb (76.7 kg)   SpO2 99%   BMI 28.12 kg/m??        General Appearance:   Well developed, well nourished,alert and oriented x 3, and   individual in no acute distress.   Ears/Nose/Mouth/Throat:    Hearing grossly normal.       Neck:  Supple.   Chest:    Lungs clear to auscultation bilaterally.   Cardiovascular:   Regular rate and rhythm, S1, S2 normal, no murmur.   Abdomen:    Soft, non-tender, bowel sounds are active.   Extremities:  No edema bilaterally.    Skin:  Warm and dry.     Telemetry:      Data Review:    Labs:    Recent Results (from the past 24 hour(s))   GLUCOSE, POC    Collection Time: 12/08/19  5:10 PM   Result Value Ref Range    Glucose (POC) 229 (H) 65 - 100 mg/dL    Performed by Flonnie Overman (RN)    GLUCOSE, POC    Collection Time: 12/08/19  9:33 PM   Result Value Ref Range    Glucose (POC) 129 (H) 65 - 100 mg/dL    Performed by Astrid Drafts    CBC WITH AUTOMATED DIFF    Collection Time: 12/09/19  2:53 AM   Result Value Ref Range    WBC 6.6 3.6 - 11.0 K/uL    RBC 2.34 (L) 3.80 - 5.20 M/uL    HGB 7.3 (L) 11.5 - 16.0 g/dL    HCT 21.9 (L) 35.0 - 47.0 %    MCV 93.6 80.0 - 99.0 FL    MCH 31.2 26.0 - 34.0 PG    MCHC 33.3 30.0 - 36.5 g/dL    RDW 13.3 11.5 - 14.5 %    PLATELET  274 150 - 400 K/uL    MPV 10.0 8.9 - 12.9 FL    NRBC 0.0 0 PER 100 WBC    ABSOLUTE NRBC 0.00 0.00 - 0.01 K/uL    NEUTROPHILS 71 32 - 75 %    LYMPHOCYTES 7 (L) 12 - 49 %    MONOCYTES 7 5 - 13 %    EOSINOPHILS 13 (H) 0 - 7 %    BASOPHILS 1 0 - 1 %    IMMATURE GRANULOCYTES 1 (H) 0.0 - 0.5 %    ABS. NEUTROPHILS 4.5 1.8 - 8.0 K/UL    ABS. LYMPHOCYTES 0.5 (L) 0.8 - 3.5 K/UL    ABS. MONOCYTES 0.5 0.0 - 1.0 K/UL    ABS. EOSINOPHILS 0.9 (H) 0.0 - 0.4 K/UL    ABS. BASOPHILS 0.1 0.0 - 0.1 K/UL    ABS. IMM. GRANS. 0.1 (H)  0.00 - 0.04 K/UL    DF SMEAR SCANNED      RBC COMMENTS OVALOCYTES  1+        RBC COMMENTS POLYCHROMASIA  PRESENT        RBC COMMENTS ATYPICAL LYMPHOCYTES PRESENT     METABOLIC PANEL, BASIC    Collection Time: 12/09/19  2:53 AM   Result Value Ref Range    Sodium 139 136 - 145 mmol/L    Potassium 3.1 (L) 3.5 - 5.1 mmol/L    Chloride 105 97 - 108 mmol/L    CO2 26 21 - 32 mmol/L    Anion gap 8 5 - 15 mmol/L    Glucose 145 (H) 65 - 100 mg/dL    BUN 15 6 - 20 MG/DL    Creatinine 1.03 (H) 0.55 - 1.02 MG/DL    BUN/Creatinine ratio 15 12 - 20      GFR est AA >60 >60 ml/min/1.38m2    GFR est non-AA 53 (L) >60 ml/min/1.48m2    Calcium 9.0 8.5 - 10.1 MG/DL   GLUCOSE, POC    Collection Time: 12/09/19  6:24 AM   Result Value Ref Range    Glucose (POC) 130 (H) 65 - 100 mg/dL    Performed by Abram Sander, TROUGH    Collection Time: 12/09/19  9:43 AM   Result Value Ref Range    Vancomycin,trough 21.1 (HH) 5.0 - 10.0 ug/mL    Reported dose date NOT PROVIDED      Reported dose time: NOT PROVIDED      Reported dose: NOT PROVIDED UNITS   GLUCOSE, POC    Collection Time: 12/09/19 11:39 AM   Result Value Ref Range    Glucose (POC) 175 (H) 65 - 100 mg/dL    Performed by Sandi Raveling           Radiology:        Current Facility-Administered Medications   Medication Dose Route Frequency   ??? potassium chloride SR (KLOR-CON 10) tablet 40 mEq  40 mEq Oral BID   ??? lactulose (CHRONULAC) 10 gram/15 mL solution 30 mL  20 g Oral BID   ??? HYDROmorphone (PF) (DILAUDID) injection 0.5 mg  0.5 mg IntraVENous Q4H PRN   ??? alteplase (CATHFLO) 1 mg in sterile water (preservative free) 1 mL injection  1 mg InterCATHeter PRN   ??? oxyCODONE IR (ROXICODONE) tablet 10 mg  10 mg Oral Q4H PRN   ??? cefepime (MAXIPIME) 2 g in 0.9% sodium chloride (MBP/ADV) 100 mL MBP  2 g IntraVENous Q8H   ??? insulin glargine (LANTUS)  injection 12 Units  12 Units SubCUTAneous QHS   ??? acetaminophen (TYLENOL) tablet 650 mg  650 mg Oral Q6H   ??? gabapentin (NEURONTIN) capsule  300 mg  300 mg Oral QHS   ??? gabapentin (NEURONTIN) capsule 100 mg  100 mg Oral DAILY   ??? sodium chloride (NS) flush 5-40 mL  5-40 mL IntraVENous Q8H   ??? sodium chloride (NS) flush 5-40 mL  5-40 mL IntraVENous PRN   ??? sodium phosphate (FLEET'S) enema 1 Enema  1 Enema Rectal PRN   ??? oxyCODONE IR (ROXICODONE) tablet 5 mg  5 mg Oral Q4H PRN   ??? losartan (COZAAR) tablet 100 mg  100 mg Oral DAILY   ??? docusate sodium (COLACE) capsule 100 mg  100 mg Oral BID   ??? senna (SENOKOT) tablet 8.6 mg  1 Tab Oral DAILY   ??? polyethylene glycol (MIRALAX) packet 17 g  17 g Oral DAILY PRN   ??? bisacodyL (DULCOLAX) suppository 10 mg  10 mg Rectal DAILY PRN   ??? hydroCHLOROthiazide (HYDRODIURIL) tablet 25 mg  25 mg Oral DAILY   ??? sodium chloride (NS) flush 5-40 mL  5-40 mL IntraVENous Q8H   ??? sodium chloride (NS) flush 5-40 mL  5-40 mL IntraVENous PRN   ??? acetaminophen (TYLENOL) suppository 650 mg  650 mg Rectal Q6H PRN   ??? promethazine (PHENERGAN) tablet 12.5 mg  12.5 mg Oral Q6H PRN    Or   ??? ondansetron (ZOFRAN) injection 4 mg  4 mg IntraVENous Q6H PRN   ??? glucose chewable tablet 16 g  4 Tab Oral PRN   ??? dextrose (D50W) injection syrg 12.5-25 g  25-50 mL IntraVENous PRN   ??? glucagon (GLUCAGEN) injection 1 mg  1 mg IntraMUSCular PRN   ??? insulin lispro (HUMALOG) injection   SubCUTAneous AC&HS   ??? aspirin delayed-release tablet 81 mg  81 mg Oral DAILY   ??? pantoprazole (PROTONIX) tablet 40 mg  40 mg Oral ACB   ??? amLODIPine (NORVASC) tablet 10 mg  10 mg Oral DAILY   ??? enoxaparin (LOVENOX) injection 40 mg  40 mg SubCUTAneous Q24H       Tyler R. Nadara Mustard, PA-C  Vevelyn Pat, MD     [facility] [place], [place] [id number]   [place], [place]   [phone]

## 2019-12-10 LAB — GLUCOSE, POC
Glucose (POC): 167 mg/dL — ABNORMAL HIGH (ref 65–100)
Glucose (POC): 98 mg/dL (ref 65–100)
Glucose (POC): 146 mg/dL — ABNORMAL HIGH (ref 65–100)
Glucose (POC): 94 mg/dL (ref 65–100)

## 2019-12-10 LAB — CBC WITH AUTOMATED DIFF
ABS. BASOPHILS: 0.1 10*3/uL (ref 0.0–0.1)
ABS. EOSINOPHILS: 0.9 10*3/uL — ABNORMAL HIGH (ref 0.0–0.4)
ABS. IMM. GRANS.: 0.1 10*3/uL — ABNORMAL HIGH (ref 0.00–0.04)
ABS. LYMPHOCYTES: 0.7 10*3/uL — ABNORMAL LOW (ref 0.8–3.5)
ABS. MONOCYTES: 0.6 10*3/uL (ref 0.0–1.0)
ABS. NEUTROPHILS: 4.3 10*3/uL (ref 1.8–8.0)
ABSOLUTE NRBC: 0 10*3/uL (ref 0.00–0.01)
BASOPHILS: 1 % (ref 0–1)
EOSINOPHILS: 13 % — ABNORMAL HIGH (ref 0–7)
HCT: 21.2 % — ABNORMAL LOW (ref 35.0–47.0)
HGB: 7 g/dL — ABNORMAL LOW (ref 11.5–16.0)
IMMATURE GRANULOCYTES: 1 % — ABNORMAL HIGH (ref 0.0–0.5)
LYMPHOCYTES: 10 % — ABNORMAL LOW (ref 12–49)
MCH: 30.6 PG (ref 26.0–34.0)
MCHC: 33 g/dL (ref 30.0–36.5)
MCV: 92.6 FL (ref 80.0–99.0)
MONOCYTES: 9 % (ref 5–13)
MPV: 9.8 FL (ref 8.9–12.9)
NEUTROPHILS: 66 % (ref 32–75)
NRBC: 0 PER 100 WBC
PLATELET: 315 10*3/uL (ref 150–400)
RBC: 2.29 M/uL — ABNORMAL LOW (ref 3.80–5.20)
RDW: 13.8 % (ref 11.5–14.5)
WBC: 6.7 10*3/uL (ref 3.6–11.0)

## 2019-12-10 LAB — METABOLIC PANEL, BASIC
Anion gap: 5 mmol/L (ref 5–15)
BUN/Creatinine ratio: 13 (ref 12–20)
BUN: 12 MG/DL (ref 6–20)
CO2: 24 mmol/L (ref 21–32)
Calcium: 9.1 MG/DL (ref 8.5–10.1)
Chloride: 108 mmol/L (ref 97–108)
Creatinine: 0.9 MG/DL (ref 0.55–1.02)
GFR est AA: >60 mL/min/{1.73_m2} (ref >60)
GFR est non-AA: >60 mL/min/{1.73_m2} (ref >60)
Glucose: 84 mg/dL (ref 65–100)
Potassium: 3.9 mmol/L (ref 3.5–5.1)
Sodium: 137 mmol/L (ref 136–145)

## 2019-12-10 LAB — CBC WITH AUTO DIFFERENTIAL
Basophils %: 1 % (ref 0–1)
Basophils Absolute: 0.1 10*3/uL (ref 0.0–0.1)
Eosinophils %: 13 % — ABNORMAL HIGH (ref 0–7)
Eosinophils Absolute: 0.9 10*3/uL — ABNORMAL HIGH (ref 0.0–0.4)
Granulocyte Absolute Count: 0.1 10*3/uL — ABNORMAL HIGH (ref 0.00–0.04)
Hematocrit: 21.2 % — ABNORMAL LOW (ref 35.0–47.0)
Hemoglobin: 7 g/dL — ABNORMAL LOW (ref 11.5–16.0)
Immature Granulocytes %: 1 % — ABNORMAL HIGH (ref 0.0–0.5)
Lymphocytes %: 10 % — ABNORMAL LOW (ref 12–49)
Lymphocytes Absolute: 0.7 10*3/uL — ABNORMAL LOW (ref 0.8–3.5)
MCH: 30.6 PG (ref 26.0–34.0)
MCHC: 33 g/dL (ref 30.0–36.5)
MCV: 92.6 FL (ref 80.0–99.0)
MPV: 9.8 FL (ref 8.9–12.9)
Monocytes %: 9 % (ref 5–13)
Monocytes Absolute: 0.6 10*3/uL (ref 0.0–1.0)
NRBC Absolute: 0 10*3/uL (ref 0.00–0.01)
Neutrophils %: 66 % (ref 32–75)
Neutrophils Absolute: 4.3 10*3/uL (ref 1.8–8.0)
Nucleated RBCs: 0 PER 100 WBC
Platelets: 315 10*3/uL (ref 150–400)
RBC: 2.29 M/uL — ABNORMAL LOW (ref 3.80–5.20)
RDW: 13.8 % (ref 11.5–14.5)
WBC: 6.7 10*3/uL (ref 3.6–11.0)

## 2019-12-10 LAB — POCT GLUCOSE
POC Glucose: 167 mg/dL — ABNORMAL HIGH (ref 65–100)
POC Glucose: 98 mg/dL (ref 65–100)
POC Glucose: 146 mg/dL — ABNORMAL HIGH (ref 65–100)
POC Glucose: 94 mg/dL (ref 65–100)

## 2019-12-10 LAB — BASIC METABOLIC PANEL
Anion Gap: 5 mmol/L (ref 5–15)
BUN/Creatinine Ratio: 13 (ref 12–20)
BUN: 12 MG/DL (ref 6–20)
CO2: 24 mmol/L (ref 21–32)
Calcium: 9.1 MG/DL (ref 8.5–10.1)
Chloride: 108 mmol/L (ref 97–108)
Creatinine: 0.9 MG/DL (ref 0.55–1.02)
GFR African American: >60 mL/min/{1.73_m2} (ref >60)
Glucose: 84 mg/dL (ref 65–100)
Potassium: 3.9 mmol/L (ref 3.5–5.1)
Sodium: 137 mmol/L (ref 136–145)
eGFR NON-AA: >60 mL/min/{1.73_m2} (ref >60)

## 2019-12-10 MED ORDER — AMLODIPINE 5 MG TAB
5 mg | Freq: Every day | ORAL | Status: DC
Start: 2019-12-10 — End: 2019-12-18
  Administered 2019-12-12 – 2019-12-18 (×6): via ORAL

## 2019-12-10 MED ORDER — LOSARTAN 50 MG TAB
50 mg | Freq: Every day | ORAL | Status: DC
Start: 2019-12-10 — End: 2019-12-18

## 2019-12-10 MED FILL — OXYCODONE 5 MG TAB: 5 mg | ORAL | Qty: 1

## 2019-12-10 MED FILL — GABAPENTIN 300 MG CAP: 300 mg | ORAL | Qty: 1

## 2019-12-10 MED FILL — DOK 100 MG CAPSULE: 100 mg | ORAL | Qty: 1

## 2019-12-10 MED FILL — ACETAMINOPHEN 325 MG TABLET: 325 mg | ORAL | Qty: 2

## 2019-12-10 MED FILL — INSULIN GLARGINE 100 UNIT/ML INJECTION: 100 unit/mL | SUBCUTANEOUS | Qty: 1

## 2019-12-10 MED FILL — CEFEPIME 2 GRAM SOLUTION FOR INJECTION: 2 gram | INTRAMUSCULAR | Qty: 2

## 2019-12-10 MED FILL — LACTULOSE 20 GRAM/30 ML ORAL SOLUTION: 20 gram/30 mL | ORAL | Qty: 30

## 2019-12-10 MED FILL — ASPIRIN 81 MG TAB, DELAYED RELEASE: 81 mg | ORAL | Qty: 1

## 2019-12-10 MED FILL — ENOXAPARIN 40 MG/0.4 ML SUB-Q SYRINGE: 40 mg/0.4 mL | SUBCUTANEOUS | Qty: 0.4

## 2019-12-10 MED FILL — GABAPENTIN 100 MG CAP: 100 mg | ORAL | Qty: 1

## 2019-12-10 MED FILL — K-TAB 10 MEQ TABLET,EXTENDED RELEASE: 10 mEq | ORAL | Qty: 4

## 2019-12-10 MED FILL — INSULIN LISPRO 100 UNIT/ML INJECTION: 100 unit/mL | SUBCUTANEOUS | Qty: 1

## 2019-12-10 MED FILL — PANTOPRAZOLE 40 MG TAB, DELAYED RELEASE: 40 mg | ORAL | Qty: 1

## 2019-12-10 MED FILL — SENNA LAX 8.6 MG TABLET: 8.6 mg | ORAL | Qty: 1

## 2019-12-10 NOTE — Progress Notes (Signed)
Problem: Mobility Impaired (Adult and Pediatric)  Goal: *Acute Goals and Plan of Care (Insert Text)  Description: FUNCTIONAL STATUS PRIOR TO ADMISSION: Patient was independent and active without use of DME.    HOME SUPPORT PRIOR TO ADMISSION: The patient lived alone with no local support.    Physical Therapy  Reassessed post-surgery 12/06/2019  1.  Patient will move from supine to sit and sit to supine , scoot up and down, and roll side to side in bed with supervision/set-up within 7 day(s).    2.  Patient will transfer from bed to chair and chair to bed with supervision/set-up using the least restrictive device within 7 day(s).  3.  Patient will perform sit to stand with supervision/set-up within 7 day(s).  4.  Patient will ambulate with min A for 50 feet with the least restrictive device within 7 day(s).   5.  Patient will ascend/descend 4 stairs with cane and one handrail(s) with supervision/set-up within 7 day(s).   Goals remain appropriate and will continue to work toward same.      Physical Therapy Goals  Initiated 11/26/2019  1.  Patient will move from supine to sit and sit to supine , scoot up and down, and roll side to side in bed with supervision/set-up within 7 day(s).    2.  Patient will transfer from bed to chair and chair to bed with supervision/set-up using the least restrictive device within 7 day(s).  3.  Patient will perform sit to stand with supervision/set-up within 7 day(s).  4.  Patient will ambulate with supervision/set-up for 50 feet with the least restrictive device within 7 day(s).   5.  Patient will ascend/descend 4 stairs with cane and one handrail(s) with supervision/set-up within 7 day(s).     Outcome: Progressing Towards Goal    PHYSICAL THERAPY TREATMENT  Patient: Lindsey Huynh [id number] y.o. female)  Date: 12/10/2019  Diagnosis: Gangrene ([place]) [I96] <principal problem not specified>  Procedure(s) (LRB):  LEFT TRANSMETATARSAL AMPUTATION WITH TENDON BALANCING (Left) 5 Days Post-Op   Precautions: (LLE PWB< 5 steps in the postop shoe, NWB longer distances)  Chart, physical therapy assessment, plan of care and goals were reviewed.    ASSESSMENT  Patient continues with skilled PT services and is progressing towards goals. Patient is feeling frustrated about feeling "left up in the chair" yesterday after working with PT. Apologized and explained that PT is only once a day and floor staff is able to assist her as needed as she is a one person assist. Patient reassured and willing to work with PT, but she is politely deferring sitting up in the chair today. Patient sit to stand x 3 with RW, min assist adhering well to PWB status. Initiated short gait training x 5 feet with RW, Verbal cues for sequencing. Pain is minimal during treatment today. Reviewed supine exercises. LE elevated in bed and Patient set up for lunch is modified chair position.     Current Level of Function Impacting Discharge (mobility/balance): min assist sit to stand with RW    Other factors to consider for discharge: Lives alone         PLAN :  Patient continues to benefit from skilled intervention to address the above impairments.  Continue treatment per established plan of care.  to address goals.    Recommendation for discharge: (in order for the patient to meet his/her long term goals)  Therapy up to 5 days/week in SNF setting    This discharge recommendation:  Has been  made in collaboration with the attending provider and/or case management    IF patient discharges home will need the following DME: patient owns DME required for discharge       SUBJECTIVE:   Patient stated ???I was up in the chair for two hours yesterday waiting for PT to come back.???    OBJECTIVE DATA SUMMARY:   Critical Behavior:  Neurologic State: Alert, Appropriate for age  Orientation Level: Oriented X4  Cognition: Appropriate decision making, Appropriate for age attention/concentration, Appropriate safety awareness, Decreased attention/concentration   Safety/Judgement: Awareness of environment  Functional Mobility Training:  Bed Mobility:  Supine to Sit: Stand-by assistance  Sit to Supine: Contact guard assistance    Transfers:  Sit to Stand: Minimum assistance;Assist x1;Additional time;Adaptive equipment  Stand to Sit: Minimum assistance;Assist x1  Bed to Chair: Minimum assistance;Assist x1    Balance:  Sitting: Intact  Standing: Impaired;With support  Standing - Static: Good;Constant support  Standing - Dynamic : Fair;Constant support    Ambulation/Gait Training:  Distance (ft): 5 Feet (ft)  Assistive Device: Gait belt;Walker, rolling  Ambulation - Level of Assistance: Minimal assistance;Assist x1  Gait Abnormalities: Antalgic;Decreased step clearance  Base of Support: Narrowed  Stance: Left decreased  Speed/Cadence: Slow  Step Length: Right shortened;Left shortened  Interventions: Safety awareness training;Verbal cues      Therapeutic Exercises:   Reviewed supine exercises     Pain Rating:  2/10 pain in left foot    Activity Tolerance:   Good, SpO2 stable on RA, and requires frequent rest breaks    After treatment patient left in no apparent distress:   Supine in bed, Call bell within reach, Side rails x 3, and set up for lunch    COMMUNICATION/COLLABORATION:   The patient???s plan of care was discussed with: Registered nurse.     Paulina Fusi, PT, DPT  Geriatric Clinical Specialist     Time Calculation: 25 mins

## 2019-12-10 NOTE — Progress Notes (Signed)
Bedside and Verbal shift change report given to [place] (Soil scientist) by Judson Roch (offgoing nurse). Report included the following information SBAR, Kardex, Procedure Summary, Intake/Output and MAR.

## 2019-12-10 NOTE — Progress Notes (Signed)
[facility]                                                                                          Hospitalist Progress Note  [name], MD  Answering service: [phone] OR [phone] from in house phone        Date of Service:  12/10/2019  NAME:  Lindsey Huynh  DOB:  January 15, 1951  MRN:  [id number]      Admission Summary:   From H&P 11/23/2019:  "Melisha Holdsworth??is a 69 y.o.??female??with past medical history of diabetes, insulin-dependent, GERD and hypertension comes for return of left foot pain and swelling.    Interval history / Subjective:     F/U for left foot cellulitis, left great toe gangrene, DMII, HTN, GERD, Hypokalemia, left popliteal occlusion s/p left fem-pop bypass  S/P TMA POD 5 seen by podiatry and pt is  cleared for d/c surgical path report unavailable  Pt will go to [facility] care      Assessment & Plan:     Left great toe Osteomyelitis   PAD: Status post femoropopliteal bypass on November 28, 2019    - MRI LLE: 1. First distal phalanx osteomyelitis.  - IV antibiotics PER ID light pseudomonas and will cont cefepime  - Vascular surgery following- Patient to follow up in 2 weeks post- op for staple removal.   - Podiatry following -TMA POD 2    LLE pain:??dry gangrene??of??left 2nd toe cellulitic changes/Left popliteal occlusion  -s/p left fem-pop bypass (03/11)  -CT LLE (03/06) NL  -Elevated Sed rate and CRP  -BC (03/06) NGTD   -Pain control  - MRI - 1. First distal phalanx osteomyelitis.. No abscess.s/p amputation wound care   ??  Anemia   Stable, No sign of bleeding noted. hgb dropped   - hgb 7.2 today - Monitor labs and transfuse if hgb <7.0   ??  DMII:??hold metformin while IP  -Cont basal insulin, SSI  -Diabetic diet  -a1c 8.1  - DM following??  ??  HTN low BP hold meds < 100/60  -Cont home meds cut down to half today   ??  GERD  -Cont PPI  ??  Hypokalemia  -potassium 3.2 replete PRN    Code status: Full   DVT prophylaxis: Lovenox     Care Plan discussed with:  Patient/Family, Nurse and Case Manager  Anticipated Disposition: ready for d/c   Anticipated Discharge: [facility] care soon  COVID retest neg      Hospital Problems  Date Reviewed: 2018/06/02          Codes Class Noted POA    Gangrene ([place]) ICD-10-CM: [id number]  ICD-9-CM: 785.4  11/23/2019 Unknown                Review of Systems:   A comprehensive review of systems was negative except for that written in the HPI.     Xr Foot Lt Ap/lat    Result Date: 12/05/2019  1. Transmetatarsal amputation.    Xr Foot Lt Min 3 V    Result Date: 11/23/2019  No acute abnormality.    Mri  Low Ext Lt Wo Cont    Result Date: 12/01/2019  1. First distal phalanx osteomyelitis. 2. No abscess.     Ct Low Ext Lt W Cont    Result Date: 11/24/2019  No CT imaging evidence for soft tissue gas, abscess or osteomyelitis.    Retail banker Technologist Service    Result Date: 11/25/2019  FLUOROSCOPY WAS USED. Fluoro Dose:  144.09 mGy. vk    Vital Signs:    Last 24hrs VS reviewed since prior progress note. Most recent are:  Visit Vitals  BP (!) 100/48 (BP 1 Location: Left upper arm, BP Patient Position: At rest)   Pulse 97   Temp 99.4 ??F (37.4 ??C)   Resp 16   Ht 5\' 5"  (1.651 m)   Wt 76.7 kg (169 lb)   SpO2 100%   BMI 28.12 kg/m??       No intake or output data in the 24 hours ending 12/10/19 1142     Physical Examination:             Constitutional:  No acute distress, cooperative   ENT:  Oral mucosa moist, oropharynx benign.    Resp:  CTA bilaterally.   CV:  Regular rhythm, normal rate,     GI:  Soft, non distended, non tender.bs+    Musculoskeletal:  LLE dressing c/d  staples intact    Neurologic:  Moves all extremities.  AAOx3, CN II-XII reviewed, follows commands     Psych:  Good insight, Not anxious nor agitated.     ??            Data Review:    Review and/or order of clinical lab test  Review and/or order of tests in the medicine section of CPT      Labs:     Recent Labs     12/10/19  0532 12/09/19  0253   WBC 6.7 6.6   HGB 7.0* 7.3*   HCT 21.2* 21.9*   PLT 315 274      Recent Labs     12/10/19  0532 12/09/19  0253 12/08/19  0118   NA 137 139 136   K 3.9 3.1* 3.2*   CL 108 105 102   CO2 24 26 28    BUN 12 15 15    CREA 0.90 1.03* 0.84   GLU 84 145* 123*   CA 9.1 9.0 9.0     No results for input(s): ALT, AP, TBIL, TBILI, TP, ALB, GLOB, GGT, AML, LPSE in the last 72 hours.    No lab exists for component: SGOT, GPT, AMYP, HLPSE  No results for input(s): INR, PTP, APTT, INREXT, INREXT in the last 72 hours.   No results for input(s): FE, TIBC, PSAT, FERR in the last 72 hours.   No results found for: FOL, RBCF   No results for input(s): PH, PCO2, PO2 in the last 72 hours.  No results for input(s): CPK, CKNDX, TROIQ in the last 72 hours.    No lab exists for component: CPKMB  Lab Results   Component Value Date/Time    Cholesterol, total 219 (H) 10/30/2018 10:27 AM    HDL Cholesterol 63 10/30/2018 10:27 AM    LDL, calculated 130 (H) 10/30/2018 10:27 AM    Triglyceride 131 10/30/2018 10:27 AM    CHOL/HDL Ratio 3.4 12/26/2007 09:12 AM     Lab Results   Component Value Date/Time    Glucose (POC) 146 (H) 12/10/2019 11:30 AM    Glucose (POC)  98 12/10/2019 06:21 AM    Glucose (POC) 167 (H) 12/09/2019 09:10 PM    Glucose (POC) 155 (H) 12/09/2019 04:21 PM    Glucose (POC) 175 (H) 12/09/2019 11:39 AM     No results found for: COLOR, APPRN, SPGRU, REFSG, PHU, PROTU, GLUCU, KETU, BILU, UROU, NITU, LEUKU, GLUKE, EPSU, BACTU, WBCU, RBCU, CASTS, UCRY      Medications Reviewed:     Current Facility-Administered Medications   Medication Dose Route Frequency   ??? potassium chloride SR (KLOR-CON 10) tablet 40 mEq  40 mEq Oral BID   ??? cefepime (MAXIPIME) 2 g in 0.9% sodium chloride (MBP/ADV) 100 mL MBP  2 g IntraVENous Q12H   ??? lactulose (CHRONULAC) 10 gram/15 mL solution 30 mL  20 g Oral BID   ??? HYDROmorphone (PF) (DILAUDID) injection 0.5 mg  0.5 mg IntraVENous Q4H PRN   ??? alteplase (CATHFLO) 1 mg in sterile water (preservative free) 1 mL injection  1 mg InterCATHeter PRN   ??? oxyCODONE IR (ROXICODONE) tablet  10 mg  10 mg Oral Q4H PRN   ??? insulin glargine (LANTUS) injection 12 Units  12 Units SubCUTAneous QHS   ??? acetaminophen (TYLENOL) tablet 650 mg  650 mg Oral Q6H   ??? gabapentin (NEURONTIN) capsule 300 mg  300 mg Oral QHS   ??? gabapentin (NEURONTIN) capsule 100 mg  100 mg Oral DAILY   ??? sodium chloride (NS) flush 5-40 mL  5-40 mL IntraVENous Q8H   ??? sodium chloride (NS) flush 5-40 mL  5-40 mL IntraVENous PRN   ??? sodium phosphate (FLEET'S) enema 1 Enema  1 Enema Rectal PRN   ??? oxyCODONE IR (ROXICODONE) tablet 5 mg  5 mg Oral Q4H PRN   ??? losartan (COZAAR) tablet 100 mg  100 mg Oral DAILY   ??? docusate sodium (COLACE) capsule 100 mg  100 mg Oral BID   ??? senna (SENOKOT) tablet 8.6 mg  1 Tab Oral DAILY   ??? polyethylene glycol (MIRALAX) packet 17 g  17 g Oral DAILY PRN   ??? bisacodyL (DULCOLAX) suppository 10 mg  10 mg Rectal DAILY PRN   ??? hydroCHLOROthiazide (HYDRODIURIL) tablet 25 mg  25 mg Oral DAILY   ??? sodium chloride (NS) flush 5-40 mL  5-40 mL IntraVENous Q8H   ??? sodium chloride (NS) flush 5-40 mL  5-40 mL IntraVENous PRN   ??? acetaminophen (TYLENOL) suppository 650 mg  650 mg Rectal Q6H PRN   ??? promethazine (PHENERGAN) tablet 12.5 mg  12.5 mg Oral Q6H PRN    Or   ??? ondansetron (ZOFRAN) injection 4 mg  4 mg IntraVENous Q6H PRN   ??? glucose chewable tablet 16 g  4 Tab Oral PRN   ??? dextrose (D50W) injection syrg 12.5-25 g  25-50 mL IntraVENous PRN   ??? glucagon (GLUCAGEN) injection 1 mg  1 mg IntraMUSCular PRN   ??? insulin lispro (HUMALOG) injection   SubCUTAneous AC&HS   ??? aspirin delayed-release tablet 81 mg  81 mg Oral DAILY   ??? pantoprazole (PROTONIX) tablet 40 mg  40 mg Oral ACB   ??? amLODIPine (NORVASC) tablet 10 mg  10 mg Oral DAILY   ??? enoxaparin (LOVENOX) injection 40 mg  40 mg SubCUTAneous Q24H     ______________________________________________________________________  EXPECTED LENGTH OF STAY: 4d 2h  ACTUAL LENGTH OF STAY:          17                 [name], MD

## 2019-12-10 NOTE — Progress Notes (Signed)
Bedside and Verbal shift change report given to [place] (Cabin crew) by Maralyn Sago (offgoing nurse). Report included the following information SBAR, Kardex, Procedure Summary, Intake/Output and MAR.

## 2019-12-10 NOTE — Progress Notes (Signed)
Dr. Lorene Dy said to hold patient's amlodipine, losartan, and hydrochlorothiazide with BP 100/48. MD also said okay to administer patient's scheduled lovenox with HGB 7.

## 2019-12-10 NOTE — Progress Notes (Signed)
 Problem: Mobility Impaired (Adult and Pediatric)  Goal: *Acute Goals and Plan of Care (Insert Text)  Description: FUNCTIONAL STATUS PRIOR TO ADMISSION: Patient was independent and active without use of DME.    HOME SUPPORT PRIOR TO ADMISSION: The patient lived alone with no local support.    Physical Therapy  Reassessed post-surgery 12/06/2019  1.  Patient will move from supine to sit and sit to supine , scoot up and down, and roll side to side in bed with supervision/set-up within 7 day(s).    2.  Patient will transfer from bed to chair and chair to bed with supervision/set-up using the least restrictive device within 7 day(s).  3.  Patient will perform sit to stand with supervision/set-up within 7 day(s).  4.  Patient will ambulate with min A for 50 feet with the least restrictive device within 7 day(s).   5.  Patient will ascend/descend 4 stairs with cane and one handrail(s) with supervision/set-up within 7 day(s).   Goals remain appropriate and will continue to work toward same.      Physical Therapy Goals  Initiated 11/26/2019  1.  Patient will move from supine to sit and sit to supine , scoot up and down, and roll side to side in bed with supervision/set-up within 7 day(s).    2.  Patient will transfer from bed to chair and chair to bed with supervision/set-up using the least restrictive device within 7 day(s).  3.  Patient will perform sit to stand with supervision/set-up within 7 day(s).  4.  Patient will ambulate with supervision/set-up for 50 feet with the least restrictive device within 7 day(s).   5.  Patient will ascend/descend 4 stairs with cane and one handrail(s) with supervision/set-up within 7 day(s).     Outcome: Progressing Towards Goal    PHYSICAL THERAPY TREATMENT  Patient: Lindsey Huynh (69 y.o. 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    ASSESSMENT  Patient continues with skilled PT services and is progressing towards goals. Patient is feeling frustrated about feeling left up in the chair yesterday after working with PT. Apologized and explained that PT is only once a day and floor staff is able to assist her as needed as she is a one person assist. Patient reassured and willing to work with PT, but she is politely deferring sitting up in the chair today. Patient sit to stand x 3 with RW, min assist adhering well to PWB status. Initiated short gait training x 5 feet with RW, Verbal cues for sequencing. Pain is minimal during treatment today. Reviewed supine exercises. LE elevated in bed and Patient set up for lunch is modified chair position.     Current Level of Function Impacting Discharge (mobility/balance): min assist sit to stand with RW    Other factors to consider for discharge: Lives alone         PLAN :  Patient continues to benefit from skilled intervention to address the above impairments.  Continue treatment per established plan of care.  to address goals.    Recommendation for discharge: (in order for the patient to meet his/her long term goals)  Therapy up to 5 days/week in SNF setting    This discharge recommendation:  Has been  made in collaboration with the attending provider and/or case management    IF patient discharges home will need the following DME: patient owns DME required for discharge       SUBJECTIVE:   Patient stated "I was up in the chair for two hours yesterday waiting for PT to come back."    OBJECTIVE DATA SUMMARY:   Critical Behavior:  Neurologic State: Alert, Appropriate for age  Orientation Level: Oriented X4  Cognition: Appropriate decision making, Appropriate for age attention/concentration, Appropriate safety awareness, Decreased  attention/concentration  Safety/Judgement: Awareness of environment  Functional Mobility Training:  Bed Mobility:  Supine to Sit: Stand-by assistance  Sit to Supine: Contact guard assistance    Transfers:  Sit to Stand: Minimum assistance;Assist x1;Additional time;Adaptive equipment  Stand to Sit: Minimum assistance;Assist x1  Bed to Chair: Minimum assistance;Assist x1    Balance:  Sitting: Intact  Standing: Impaired;With support  Standing - Static: Good;Constant support  Standing - Dynamic : Fair;Constant support    Ambulation/Gait Training:  Distance (ft): 5 Feet (ft)  Assistive Device: Gait belt;Walker, rolling  Ambulation - Level of Assistance: Minimal assistance;Assist x1  Gait Abnormalities: Antalgic;Decreased step clearance  Base of Support: Narrowed  Stance: Left decreased  Speed/Cadence: Slow  Step Length: Right shortened;Left shortened  Interventions: Safety awareness training;Verbal cues      Therapeutic Exercises:   Reviewed supine exercises     Pain Rating:  2/10 pain in left foot    Activity Tolerance:   Good, SpO2 stable on RA, and requires frequent rest breaks    After treatment patient left in no apparent distress:   Supine in bed, Call bell within reach, Side rails x 3, and set up for lunch    COMMUNICATION/COLLABORATION:   The patient's plan of care was discussed with: Registered nurse.     Royetta ONEIDA Sella, PT, DPT  Geriatric Clinical Specialist     Time Calculation: 25 mins

## 2019-12-10 NOTE — Progress Notes (Signed)
Bedside and Verbal shift change report given to [place] (oncoming nurse) by Aniceto Boss RN (offgoing nurse). Report included the following information SBAR, Kardex and MAR.

## 2019-12-10 NOTE — Progress Notes (Signed)
ID Progress Note  12/10/2019       Left tma 3/18    Subjective:       Afebrile. No dyspnea, abdominal pain, headache or sore throat    ROS: No anaphylaxis, seizures, syncope, hematemesis, hematochezia     Objective:     Vitals:   Visit Vitals  BP 129/72 (BP 1 Location: Left upper arm)   Pulse 84   Temp 98.9 ??F (37.2 ??C)   Resp 16   Ht 5' 5"  (1.651 m)   Wt 76.7 kg (169 lb)   SpO2 100%   BMI 28.12 kg/m??        Tmax:  Temp (24hrs), Avg:98.9 ??F (37.2 ??C), Min:98.5 ??F (36.9 ??C), Max:99.4 ??F (37.4 ??C)      Exam:    Not in distress  Pink conjunctivae, anicteric sclerae  No cervical lymphdenopathy   Lung clear, no rales, wheezes or rhonchi   Heart: s1, s2, RRR, no murmurs rubs or clicks  Abdomen: soft nontender, no guarding or rebound  Knees not warm or tender  Left foot anterior leg and calf with no ascending cellulitis.     Labs:   Lab Results   Component Value Date/Time    WBC 6.7 12/10/2019 05:32 AM    HGB 7.0 (L) 12/10/2019 05:32 AM    HCT 21.2 (L) 12/10/2019 05:32 AM    PLATELET 315 12/10/2019 05:32 AM    MCV 92.6 12/10/2019 05:32 AM     Lab Results   Component Value Date/Time    Sodium 137 12/10/2019 05:32 AM    Potassium 3.9 12/10/2019 05:32 AM    Chloride 108 12/10/2019 05:32 AM    CO2 24 12/10/2019 05:32 AM    Anion gap 5 12/10/2019 05:32 AM    Glucose 84 12/10/2019 05:32 AM    BUN 12 12/10/2019 05:32 AM    Creatinine 0.90 12/10/2019 05:32 AM    BUN/Creatinine ratio 13 12/10/2019 05:32 AM    GFR est AA >60 12/10/2019 05:32 AM    GFR est non-AA >60 12/10/2019 05:32 AM    Calcium 9.1 12/10/2019 05:32 AM    Bilirubin, total 0.2 11/23/2019 01:32 PM    Alk. 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  #2 hyperlipidemia  ??  #3  hypertension  ??  #4 diabetes  ??    Recommendations:       Had TMA 3/18. Pathology of proximal margin shows no osteo. Would finish cefepime today.      Will sign off. I can see again if asked.     Jolette Lana Sallyanne Havers, MD

## 2019-12-10 NOTE — Progress Notes (Signed)
Progress Notes by [name], MD at 12/10/19 1142                Author: [name], MD  Service: Hospitalist  Author Type: Physician       Filed: 12/10/19 1145  Date of Service: 12/10/19 1142  Status: Signed          Editor: [name], MD (Physician)                    [facility] Adult  Hospitalist Group                                                                                              Hospitalist Progress Note   [name], MD   Answering service: [phone] from in house phone            Date of Service:  12/10/2019   NAME:  Lindsey Huynh   DOB:  24-Jul-1951   MRN:  [id number]           Admission Summary:        From H&P 11/23/2019:   "Abrienne Xiang??is a 69 y.o.??female??with past medical history of diabetes, insulin-dependent, GERD and hypertension comes for return of left foot pain and swelling.      Interval history / Subjective:          F/U for left foot cellulitis, left great toe gangrene, DMII, HTN, GERD, Hypokalemia, left popliteal occlusion s/p left fem-pop bypass   S/P TMA POD 5 seen by podiatry and pt is  cleared for d/c surgical path report unavailable   Pt will go to [facility] care           Assessment & Plan:          Left great toe Osteomyelitis    PAD: Status post femoropopliteal bypass on November 28, 2019      - MRI LLE: 1. First distal phalanx osteomyelitis.   - IV antibiotics PER ID light pseudomonas and will cont cefepime   - Vascular surgery following- Patient to follow up in 2 weeks post- op for staple removal.    - Podiatry following -TMA POD 2      LLE pain:??dry gangrene??of??left 2nd toe cellulitic changes/Left popliteal occlusion   -s/p left fem-pop bypass (03/11)   -CT LLE (03/06) NL   -Elevated Sed rate and CRP   -BC (03/06) NGTD    -Pain control   - MRI - 1. First distal phalanx osteomyelitis.. No abscess.s/p amputation wound care    ??   Anemia    Stable, No sign of bleeding noted. hgb dropped    - hgb 7.2 today - Monitor labs and  transfuse if hgb <7.0    ??   DMII:??hold metformin while IP   -Cont basal insulin, SSI   -Diabetic diet   -a1c 8.1   - DM following??   ??   HTN low BP hold meds < 100/60   -Cont home meds cut down to half today    ??   GERD   -Cont PPI   ??  Hypokalemia   -potassium 3.2 replete PRN      Code status: Full    DVT prophylaxis: Lovenox       Care Plan discussed with: Patient/Family, Nurse and Case Manager   Anticipated Disposition: ready for d/c    Anticipated Discharge: [facility]  COVID retest neg            Hospital Problems   Date Reviewed:  05/31/2018                         Codes  Class  Noted  POA              Gangrene ([place])  ICD-10-CM: [id number]   ICD-9-CM: 785.4    11/23/2019  Unknown                               Review of Systems:     A comprehensive review of systems was negative except for that written in the HPI.       Xr Foot Lt Ap/lat      Result Date: 12/05/2019   1. Transmetatarsal amputation.      Xr Foot Lt Min 3 V      Result Date: 11/23/2019   No acute abnormality.      Mri Low Ext Lt Wo Cont      Result Date: 12/01/2019   1. First distal phalanx osteomyelitis. 2. No abscess.       Ct Low Ext Lt W Cont      Result Date: 11/24/2019   No CT imaging evidence for soft tissue gas, abscess or osteomyelitis.      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 Fluoro Dose:  144.09 mGy. vk        Vital Signs:      Last 24hrs VS reviewed since prior progress note. Most recent are:   Visit Vitals      BP  (!) 100/48 (BP 1 Location: Left upper arm, BP Patient Position: At rest)     Pulse  97     Temp  99.4 ??F (37.4 ??C)     Resp  16     Ht  5\' 5"  (1.651 m)     Wt  76.7 kg (169 lb)     SpO2  100%        BMI  28.12 kg/m??           No intake or output data in the 24 hours ending 12/10/19 1142         Physical Examination:                     Constitutional:   No acute distress, cooperative     ENT:   Oral mucosa moist, oropharynx benign.      Resp:   CTA bilaterally.     CV:   Regular rhythm,  normal rate,          GI:   Soft, non distended, non tender.bs+         Musculoskeletal:   LLE dressing c/d  staples intact         Neurologic:   Moves all extremities.  AAOx3, CN II-XII reviewed, follows commands       Psych:  Good insight, Not anxious nor agitated.         ??  Data Review:      Review and/or order of clinical lab test   Review and/or order of tests in the medicine section of CPT           Labs:          Recent Labs            12/10/19   0532  12/09/19   0253     WBC  6.7  6.6     HGB  7.0*  7.3*     HCT  21.2*  21.9*         PLT  315  274          Recent Labs             12/10/19   0532  12/09/19   0253  12/08/19   0118     NA  137  139  136     K  3.9  3.1*  3.2*     CL  108  105  102     CO2  24  26  28      BUN  12  15  15      CREA  0.90  1.03*  0.84     GLU  84  145*  123*          CA  9.1  9.0  9.0        No results for input(s): ALT, AP, TBIL, TBILI, TP, ALB, GLOB, GGT, AML, LPSE in the last 72 hours.      No lab exists for component: SGOT, GPT, AMYP, HLPSE   No results for input(s): INR, PTP, APTT, INREXT, INREXT in the last 72 hours.    No results for input(s): FE, TIBC, PSAT, FERR in the last 72 hours.    No results found for: FOL, RBCF    No results for input(s): PH, PCO2, PO2 in the last 72 hours.   No results for input(s): CPK, CKNDX, TROIQ in the last 72 hours.      No lab exists for component: CPKMB     Lab Results         Component  Value  Date/Time            Cholesterol, total  219 (H)  10/30/2018 10:27 AM       HDL Cholesterol  63  10/30/2018 10:27 AM       LDL, calculated  130 (H)  10/30/2018 10:27 AM       Triglyceride  131  10/30/2018 10:27 AM            CHOL/HDL Ratio  3.4  12/26/2007 09:12 AM          Lab Results         Component  Value  Date/Time            Glucose (POC)  146 (H)  12/10/2019 11:30 AM       Glucose (POC)  98  12/10/2019 06:21 AM       Glucose (POC)  167 (H)  12/09/2019 09:10 PM       Glucose (POC)  155 (H)  12/09/2019 04:21 PM             Glucose (POC)  175 (H)  12/09/2019 11:39 AM        No results found for: COLOR, APPRN, SPGRU, REFSG, PHU, PROTU, GLUCU, KETU, BILU, UROU, NITU, LEUKU, GLUKE, EPSU, BACTU, WBCU, RBCU, CASTS, UCRY  Medications Reviewed:          Current Facility-Administered Medications          Medication  Dose  Route  Frequency           ?  potassium chloride SR (KLOR-CON 10) tablet 40 mEq   40 mEq  Oral  BID     ?  cefepime (MAXIPIME) 2 g in 0.9% sodium chloride (MBP/ADV) 100 mL MBP   2 g  IntraVENous  Q12H     ?  lactulose (CHRONULAC) 10 gram/15 mL solution 30 mL   20 g  Oral  BID     ?  HYDROmorphone (PF) (DILAUDID) injection 0.5 mg   0.5 mg  IntraVENous  Q4H PRN     ?  alteplase (CATHFLO) 1 mg in sterile water (preservative free) 1 mL injection   1 mg  InterCATHeter  PRN     ?  oxyCODONE IR (ROXICODONE) tablet 10 mg   10 mg  Oral  Q4H PRN     ?  insulin glargine (LANTUS) injection 12 Units   12 Units  SubCUTAneous  QHS     ?  acetaminophen (TYLENOL) tablet 650 mg   650 mg  Oral  Q6H     ?  gabapentin (NEURONTIN) capsule 300 mg   300 mg  Oral  QHS     ?  gabapentin (NEURONTIN) capsule 100 mg   100 mg  Oral  DAILY     ?  sodium chloride (NS) flush 5-40 mL   5-40 mL  IntraVENous  Q8H     ?  sodium chloride (NS) flush 5-40 mL   5-40 mL  IntraVENous  PRN     ?  sodium phosphate (FLEET'S) enema 1 Enema   1 Enema  Rectal  PRN           ?  oxyCODONE IR (ROXICODONE) tablet 5 mg   5 mg  Oral  Q4H PRN           ?  losartan (COZAAR) tablet 100 mg   100 mg  Oral  DAILY     ?  docusate sodium (COLACE) capsule 100 mg   100 mg  Oral  BID     ?  senna (SENOKOT) tablet 8.6 mg   1 Tab  Oral  DAILY     ?  polyethylene glycol (MIRALAX) packet 17 g   17 g  Oral  DAILY PRN     ?  bisacodyL (DULCOLAX) suppository 10 mg   10 mg  Rectal  DAILY PRN     ?  hydroCHLOROthiazide (HYDRODIURIL) tablet 25 mg   25 mg  Oral  DAILY     ?  sodium chloride (NS) flush 5-40 mL   5-40 mL  IntraVENous  Q8H     ?  sodium chloride (NS) flush 5-40 mL   5-40 mL   IntraVENous  PRN     ?  acetaminophen (TYLENOL) suppository 650 mg   650 mg  Rectal  Q6H PRN     ?  promethazine (PHENERGAN) tablet 12.5 mg   12.5 mg  Oral  Q6H PRN          Or           ?  ondansetron (ZOFRAN) injection 4 mg   4 mg  IntraVENous  Q6H PRN     ?  glucose chewable tablet 16 g   4 Tab  Oral  PRN     ?  dextrose (D50W) injection syrg 12.5-25 g   25-50 mL  IntraVENous  PRN     ?  glucagon (GLUCAGEN) injection 1 mg   1 mg  IntraMUSCular  PRN     ?  insulin lispro (HUMALOG) injection     SubCUTAneous  AC&HS     ?  aspirin delayed-release tablet 81 mg   81 mg  Oral  DAILY     ?  pantoprazole (PROTONIX) tablet 40 mg   40 mg  Oral  ACB     ?  amLODIPine (NORVASC) tablet 10 mg   10 mg  Oral  DAILY           ?  enoxaparin (LOVENOX) injection 40 mg   40 mg  SubCUTAneous  Q24H        ______________________________________________________________________   EXPECTED LENGTH OF STAY: 4d 2h   ACTUAL LENGTH OF STAY:          17                    [name], MD

## 2019-12-10 NOTE — Group Note (Signed)
 Diabetes Mgmt by Moises Rosaline LABOR, CNS at 12/10/19 1133                Author: Moises Rosaline LABOR, CNS  Service: Certified Clinical Nurse Specialist  Author Type: Clinical Nurse Specialist       Filed: 12/10/19 1147  Date of Service: 12/10/19 1133  Status: Signed          Editor: Moises Rosaline LABOR, CNS (Clinical Nurse Specialist)               [place]   PROGRAM FOR DIABETES HEALTH      CLINICAL NURSE SPECIALIST CONSULT       FOLLOW UP  NOTE     Initial Presentation     Lindsey Huynh is a 69 y.o.  female who presented to the ED 11/23/19 with left foot/great toe pain . Given concerns for dry gangrene and cellulitis, she was started on IV and admitted under hospitalist service   HX:      Past Medical History:        Diagnosis  Date         ?  Diabetes (HCC)            insulin  + metformin          ?  GERD (gastroesophageal reflux disease)       ?  Hypercholesterolemia           ?  Hypertension              DX: Left great toe osteomyelitis      TX: Vascular surgery/podiatry consulted, Left fem-pop bypass, amputation of left great toe, IV antibiotics        Hospital course     Clinical progress has been complicated by venous insufficiency      S/p Fem-Pop bypass on 3/11 and recovering well-Dr. Earvin following   Podiatry following for left 1st and 2nd toe diabetic ulcers with necrotic eschar present. NO surgical intervention chosen at this time-treating conservatively.        12/05/19: Patient elected for amputation of left 1st and 2nd toe-cardiology cleared     Diabetes      Patient has known Type 2 diabetes, treated with Levemir and Metformin   PTA.       Admission BG 170 and A1c 8.1% indicate poor diabetes control.    Previous A1C 03/2019 11.0% . Highest A1C Feb. 2020-12.1%.       Ambulatory blood glucose management provided by primary care provider-Last seen 11/05/2019 virtual visit      Consulted by Provider for advanced diabetes nursing assessment and care, specifically related  to    [x]  Home  management assessment      Diabetes-related medical history   Acute complications   NONE   Neurological complications   Peripheral neuropathy   Microvascular disease   NONE   Macrovascular disease   Peripheral vascular disease and Foot wounds   Other associated conditions      HTN/hypercholesteremia      Diabetes medication history        Drug class  Currently in use  Discontinued  Never used          Biguanide  Metformin  1000mg  daily in divided doses              DDP-4 inhibitor                 Sulfonylurea  Thiazolidinedione                GLP-1 RA                SGLT-2 inhibitors                Basal insulin   Levemir 20units daily              Bolus insulin                 Fixed Dose  Combinations                Subjective     S/p left transmetatarsal amputation with percutaneous achilles tendon lengthening 3/18      IV antibiotics      Slated for discharge to rehab per hospitalist note-she is looking forward to going to rehab so that she can eventually get home.     Objective     Physical exam   General Alert, oriented and in no acute distress. Conversant and cooperative.    Vital Signs    Visit Vitals      BP  (!) 100/48 (BP 1 Location: Left upper arm, BP Patient Position: At rest)     Pulse  97     Temp  99.4 F (37.4 C)     Resp  16     Ht  5' 5 (1.651 m)     Wt  76.7 kg (169 lb)     SpO2  100%        BMI  28.12 kg/m        Skin  Warm and dry.     Heart   Regular rate and rhythm. No murmurs, rubs or gallops   Lungs  Clear to auscultation without rales or rhonchi   Extremities S/p left great toe amputation            Laboratory   BMP:      Lab Results         Component  Value  Date/Time            NA  137  12/10/2019 05:32 AM       K  3.9  12/10/2019 05:32 AM       CL  108  12/10/2019 05:32 AM       CO2  24  12/10/2019 05:32 AM       AGAP  5  12/10/2019 05:32 AM       GLU  84  12/10/2019 05:32 AM       BUN  12  12/10/2019 05:32 AM       CREA  0.90  12/10/2019 05:32 AM       GFRAA  >60   12/10/2019 05:32 AM            GFRNA  >60  12/10/2019 05:32 AM         Factors impacting BG management       Factor  Dose  Comments         Nutrition:   Carb-controlled meals        60 grams/meal          Pain  Left foot           Infection  IV abx (vanc/ cefepime)           Other: venous insuffiencey   Vascular surgery following  S/p fem pop 11/28/19        Blood glucose pattern  Assessment and Plan        Nursing Diagnosis  Risk for unstable blood glucose pattern     Nursing Intervention Domain  5250 Decision-making Support        Nursing Interventions  Examined current inpatient diabetes control    Explored factors facilitating and impeding inpatient management   Identified self-management practices impeding diabetes control   Explored corrective strategies with patient and responsible inpatient provider    Informed patient of rational for insulin  strategy while hospitalized      Instructed patient in importance of checking blood glucose DAILY before meals - discussed when she should check her BG after meals-2hours-          Evaluation     [name], with uncontrolled Type 2 diabetes, admitted in the setting of diabetic foot ulcer with osteomyelitis and venus insufficiency now s/p fem/pop bypass and amputation  of the left great toe.  Prior to admission, she did not achieve diabetes control, as evidenced by admission BG of 170 and A1c of 8.1%.  While her A1C is improved since July 2020 when it was >11%, her chronic elevated glucose is a direct contributor to  her foot wounds.  During the inpatient admission, she had excellent glucose response to low dose basal and correctional insulin .  She did receive a one time dose of decadron  in the OR yesterday impacting her fasting glucose this am (242).  Decadron  will  elevate glucose for about 24 hrs (until 9pm tonight).  She will need to continue optimal glucose control for wound heeling.  Inpatient glucose goal 100-180.        BG trends remain within  target with current basal and correctional insulin  regimen.       Recommendations        [x]  Use of  Subcutaneous Insulin  Order set [id number])       Insulin   Dosing  Specific recommendation         CONTINUE   Basal                               (Based on weight, BMI & GFR)  12 units daily     Please reduce to 10 units once daily if    fasting glucose under 100         CONTINUE Corrective                     (Useful in adjusting insulin  dosing)  [x]   Normal sensitivity ACHS                  Discharge Planning     1.   Discharge to rehab on hospital doses of insulin .    2. Re-start Metformin  at PTA doses   3. Once discharged from [facility] will need close follow up with PCP- will this route note.     Billing Code(s)     [x]  99231 IP subsequent hospital  care - 15 minutes      Before making these care recommendations, I personally reviewed the hospitalization record, including notes, laboratory & diagnostic data and current medications, and    examined the patient at the bedside (circumstances permitting) before making care recommendations.      Total minutes: 15      Rosaline DELENA Bangs, CNS   Diabetes Clinical Nurse Specialist   Program for Diabetes Health   Access via Perfect Serve

## 2019-12-10 NOTE — Progress Notes (Signed)
Bedside and Verbal shift change report given to [place] (oncoming nurse) by Aniceto Boss RN (offgoing nurse). Report included the following information SBAR, Kardex and MAR.

## 2019-12-10 NOTE — Progress Notes (Addendum)
Dr. Lorene Dy said to hold patient's amlodipine, losartan, and hydrochlorothiazide with BP 100/48. MD also said okay to administer patient's scheduled lovenox with HGB 7.

## 2019-12-10 NOTE — Other (Signed)
[facility]    CLINICAL NURSE SPECIALIST CONSULT     FOLLOW UP  NOTE  Initial Presentation   Lindsey Huynh is a 69 y.o. female who presented to the ED 11/23/19 with left foot/great toe pain. Given concerns for dry gangrene and cellulitis, she was started on IV and admitted under hospitalist service  HX:   Past Medical History:   Diagnosis Date   ??? Diabetes ([place])     insulin + metformin   ??? GERD (gastroesophageal reflux disease)    ??? Hypercholesterolemia    ??? Hypertension         DX: Left great toe osteomyelitis    TX: Vascular surgery/podiatry consulted, Left fem-pop bypass, amputation of left great toe, IV antibiotics    Hospital course   Clinical progress has been complicated by venous insufficiency    S/p Fem-Pop bypass on 3/11 and recovering well-Dr. Timmothy Sours following  Podiatry following for left 1st and 2nd toe diabetic ulcers with necrotic eschar present. NO surgical intervention chosen at this time-treating conservatively.      12/05/19: Patient elected for amputation of left 1st and 2nd toe-cardiology cleared  Diabetes    Patient has known Type 2 diabetes, treated with Levemir and Metformin PTA.     Admission BG 170 and A1c 8.1% indicate poor diabetes control.   Previous A1C 03/2019 11.0% . Highest A1C Feb. 2020-12.1%.     Ambulatory blood glucose management provided by primary care provider-Last seen 11/05/2019 virtual visit    Consulted by Provider for advanced diabetes nursing assessment and care, specifically related to   [x]  Home management assessment    Diabetes-related medical history  Acute complications  NONE  Neurological complications  Peripheral neuropathy  Microvascular disease  NONE  Macrovascular disease  Peripheral vascular disease and Foot wounds  Other associated conditions     HTN/hypercholesteremia    Diabetes medication history  Drug class Currently in use Discontinued Never used   Biguanide Metformin 1000mg  daily in divided doses     DDP-4 inhibitor        Sulfonylurea      Thiazolidinedione      GLP-1 RA      SGLT-2 inhibitors      Basal insulin Levemir 20units daily     Bolus insulin      Fixed Dose  Combinations        Subjective   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  Objective   Physical exam  General Alert, oriented and in no acute distress. Conversant and cooperative.   Vital Signs   Visit Vitals  BP (!) 100/48 (BP 1 Location: Left upper arm, BP Patient Position: At rest)   Pulse 97   Temp 99.4 ??F (37.4 ??C)   Resp 16   Ht 5\' 5"  (1.651 m)   Wt 76.7 kg (169 lb)   SpO2 100%   BMI 28.12 kg/m??     Skin  Warm and dry.    Heart   Regular rate and rhythm. No murmurs, rubs or gallops  Lungs  Clear to auscultation without rales or rhonchi  Extremities S/p left great toe amputation         Laboratory  BMP:   Lab Results   Component Value Date/Time    NA 137  12/10/2019 05:32 AM    K 3.9 12/10/2019 05:32 AM    CL 108 12/10/2019 05:32 AM    CO2 24 12/10/2019 05:32 AM    AGAP 5 12/10/2019 05:32 AM    GLU 84 12/10/2019 05:32 AM    BUN 12 12/10/2019 05:32 AM    CREA 0.90 12/10/2019 05:32 AM    GFRAA >60 12/10/2019 05:32 AM    GFRNA >60 12/10/2019 05:32 AM      Factors impacting BG management  Factor Dose Comments   Nutrition:  Carb-controlled meals     60 grams/meal      Pain Left foot    Infection IV abx (vanc/ cefepime)    Other: venous insuffiencey  Vascular surgery following S/p fem pop 11/28/19     Blood glucose pattern        Assessment and Plan   Nursing Diagnosis Risk for unstable blood glucose pattern   Nursing Intervention Domain 5250 Decision-making Support   Nursing Interventions Examined current inpatient diabetes control   Explored factors facilitating and impeding inpatient management  Identified self-management practices impeding diabetes control  Explored  corrective strategies with patient and responsible inpatient provider   Informed patient of rational for insulin strategy while hospitalized    Instructed patient in importance of checking blood glucose DAILY before meals - discussed when she should check her BG after meals-2hours-     Evaluation   [name], with uncontrolled Type 2 diabetes, admitted in the setting of diabetic foot ulcer with osteomyelitis and venus insufficiency now s/p fem/pop bypass and amputation of the left great toe.  Prior to admission, she did not achieve diabetes control, as evidenced by admission BG of 170 and A1c of 8.1%.  While her A1C is improved since July 2020 when it was >11%, her chronic elevated glucose is a direct contributor to her foot wounds.  During the inpatient admission, she had excellent glucose response to low dose basal and correctional insulin.  She did receive a one time dose of decadron in the OR yesterday impacting her fasting glucose this am (242).  Decadron will elevate glucose for about 24 hrs (until 9pm tonight).  She will need to continue optimal glucose control for wound heeling.  Inpatient glucose goal 100-180.      BG trends remain within target with current basal and correctional insulin regimen.    Recommendations     [x]  Use of Subcutaneous Insulin Order set [id number])  Insulin Dosing Specific recommendation   CONTINUE   Basal                               (Based on weight, BMI & GFR) 12 units daily   Please reduce to 10 units once daily if   fasting glucose under 100   CONTINUE Corrective                     (Useful in adjusting insulin dosing) [x]  Normal sensitivity ACHS          Discharge Planning   1.   Discharge to rehab on hospital doses of insulin.   2. Re-start Metformin at PTA doses  3. Once discharged from [facility] will need close follow up with PCP- will this route note.  Billing Code(s)   [x]  99231 IP subsequent hospital care - 15 minutes    Before making these care recommendations, I  personally reviewed the hospitalization record, including  notes, laboratory & diagnostic data and current medications, and   examined the patient at the bedside (circumstances permitting) before making care recommendations.     Total minutes: 15    Lily Lovings, CNS  Diabetes Clinical Nurse Specialist  [facility]  Access via [facility]

## 2019-12-10 NOTE — Progress Notes (Signed)
ID Progress Note  12/10/2019       Left tma 3/18    Subjective:       Afebrile. No dyspnea, abdominal pain, headache or sore throat    ROS: No anaphylaxis, seizures, syncope, hematemesis, hematochezia     Objective:     Vitals:   Visit Vitals  BP 129/72 (BP 1 Location: Left upper arm)   Pulse 84   Temp 98.9 ??F (37.2 ??C)   Resp 16   Ht 5' 5"  (1.651 m)   Wt 76.7 kg (169 lb)   SpO2 100%   BMI 28.12 kg/m??        Tmax:  Temp (24hrs), Avg:98.9 ??F (37.2 ??C), Min:98.5 ??F (36.9 ??C), Max:99.4 ??F (37.4 ??C)      Exam:    Not in distress  Pink conjunctivae, anicteric sclerae  No cervical lymphdenopathy   Lung clear, no rales, wheezes or rhonchi   Heart: s1, s2, RRR, no murmurs rubs or clicks  Abdomen: soft nontender, no guarding or rebound  Knees not warm or tender  Left foot anterior leg and calf with no ascending cellulitis.     Labs:   Lab Results   Component Value Date/Time    WBC 6.7 12/10/2019 05:32 AM    HGB 7.0 (L) 12/10/2019 05:32 AM    HCT 21.2 (L) 12/10/2019 05:32 AM    PLATELET 315 12/10/2019 05:32 AM    MCV 92.6 12/10/2019 05:32 AM     Lab Results   Component Value Date/Time    Sodium 137 12/10/2019 05:32 AM    Potassium 3.9 12/10/2019 05:32 AM    Chloride 108 12/10/2019 05:32 AM    CO2 24 12/10/2019 05:32 AM    Anion gap 5 12/10/2019 05:32 AM    Glucose 84 12/10/2019 05:32 AM    BUN 12 12/10/2019 05:32 AM    Creatinine 0.90 12/10/2019 05:32 AM    BUN/Creatinine ratio 13 12/10/2019 05:32 AM    GFR est AA >60 12/10/2019 05:32 AM    GFR est non-AA >60 12/10/2019 05:32 AM    Calcium 9.1 12/10/2019 05:32 AM    Bilirubin, total 0.2 11/23/2019 01:32 PM    Alk. 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  #2 hyperlipidemia  ??  #3 hypertension   ??  #4 diabetes  ??    Recommendations:       Had TMA 3/18. Pathology of proximal margin shows no osteo. Would finish cefepime today.      Will sign off. I can see again if asked.     Cattie Tineo Sallyanne Havers, MD

## 2019-12-11 DIAGNOSIS — J3089 Other allergic rhinitis: Secondary | ICD-10-CM | POA: Diagnosis not present

## 2019-12-11 DIAGNOSIS — J301 Allergic rhinitis due to pollen: Secondary | ICD-10-CM | POA: Diagnosis not present

## 2019-12-11 LAB — METABOLIC PANEL, BASIC
Anion gap: 7 mmol/L (ref 5–15)
BUN/Creatinine ratio: 13 (ref 12–20)
BUN: 11 MG/DL (ref 6–20)
CO2: 22 mmol/L (ref 21–32)
Calcium: 9 MG/DL (ref 8.5–10.1)
Chloride: 105 mmol/L (ref 97–108)
Creatinine: 0.84 MG/DL (ref 0.55–1.02)
GFR est AA: >60 mL/min/{1.73_m2} (ref >60)
GFR est non-AA: >60 mL/min/{1.73_m2} (ref >60)
Glucose: 115 mg/dL — ABNORMAL HIGH (ref 65–100)
Potassium: 4.3 mmol/L (ref 3.5–5.1)
Sodium: 134 mmol/L — ABNORMAL LOW (ref 136–145)

## 2019-12-11 LAB — GLUCOSE, POC
Glucose (POC): 155 mg/dL — ABNORMAL HIGH (ref 65–100)
Glucose (POC): 132 mg/dL — ABNORMAL HIGH (ref 65–100)
Glucose (POC): 196 mg/dL — ABNORMAL HIGH (ref 65–100)
Glucose (POC): 127 mg/dL — ABNORMAL HIGH (ref 65–100)

## 2019-12-11 LAB — CBC WITH AUTOMATED DIFF
ABS. BASOPHILS: 0.1 10*3/uL (ref 0.0–0.1)
ABS. EOSINOPHILS: 0.8 10*3/uL — ABNORMAL HIGH (ref 0.0–0.4)
ABS. IMM. GRANS.: 0.1 10*3/uL — ABNORMAL HIGH (ref 0.00–0.04)
ABS. LYMPHOCYTES: 0.8 10*3/uL (ref 0.8–3.5)
ABS. MONOCYTES: 0.6 10*3/uL (ref 0.0–1.0)
ABS. NEUTROPHILS: 5.2 10*3/uL (ref 1.8–8.0)
ABSOLUTE NRBC: 0 10*3/uL (ref 0.00–0.01)
BASOPHILS: 1 % (ref 0–1)
EOSINOPHILS: 11 % — ABNORMAL HIGH (ref 0–7)
HCT: 23 % — ABNORMAL LOW (ref 35.0–47.0)
HGB: 7.7 g/dL — ABNORMAL LOW (ref 11.5–16.0)
IMMATURE GRANULOCYTES: 1 % — ABNORMAL HIGH (ref 0.0–0.5)
LYMPHOCYTES: 10 % — ABNORMAL LOW (ref 12–49)
MCH: 30.8 PG (ref 26.0–34.0)
MCHC: 33.5 g/dL (ref 30.0–36.5)
MCV: 92 FL (ref 80.0–99.0)
MONOCYTES: 8 % (ref 5–13)
MPV: 9.8 FL (ref 8.9–12.9)
NEUTROPHILS: 69 % (ref 32–75)
NRBC: 0 PER 100 WBC
PLATELET: 346 10*3/uL (ref 150–400)
RBC: 2.5 M/uL — ABNORMAL LOW (ref 3.80–5.20)
RDW: 13.7 % (ref 11.5–14.5)
WBC: 7.6 10*3/uL (ref 3.6–11.0)

## 2019-12-11 LAB — POCT GLUCOSE
POC Glucose: 155 mg/dL — ABNORMAL HIGH (ref 65–100)
POC Glucose: 132 mg/dL — ABNORMAL HIGH (ref 65–100)
POC Glucose: 196 mg/dL — ABNORMAL HIGH (ref 65–100)
POC Glucose: 127 mg/dL — ABNORMAL HIGH (ref 65–100)

## 2019-12-11 LAB — BASIC METABOLIC PANEL
Anion Gap: 7 mmol/L (ref 5–15)
BUN/Creatinine Ratio: 13 (ref 12–20)
BUN: 11 MG/DL (ref 6–20)
CO2: 22 mmol/L (ref 21–32)
Calcium: 9 MG/DL (ref 8.5–10.1)
Chloride: 105 mmol/L (ref 97–108)
Creatinine: 0.84 MG/DL (ref 0.55–1.02)
GFR African American: >60 mL/min/{1.73_m2} (ref >60)
Glucose: 115 mg/dL — ABNORMAL HIGH (ref 65–100)
Potassium: 4.3 mmol/L (ref 3.5–5.1)
Sodium: 134 mmol/L — ABNORMAL LOW (ref 136–145)
eGFR NON-AA: >60 mL/min/{1.73_m2} (ref >60)

## 2019-12-11 LAB — CBC WITH AUTO DIFFERENTIAL
Basophils %: 1 % (ref 0–1)
Basophils Absolute: 0.1 10*3/uL (ref 0.0–0.1)
Eosinophils %: 11 % — ABNORMAL HIGH (ref 0–7)
Eosinophils Absolute: 0.8 10*3/uL — ABNORMAL HIGH (ref 0.0–0.4)
Granulocyte Absolute Count: 0.1 10*3/uL — ABNORMAL HIGH (ref 0.00–0.04)
Hematocrit: 23 % — ABNORMAL LOW (ref 35.0–47.0)
Hemoglobin: 7.7 g/dL — ABNORMAL LOW (ref 11.5–16.0)
Immature Granulocytes %: 1 % — ABNORMAL HIGH (ref 0.0–0.5)
Lymphocytes %: 10 % — ABNORMAL LOW (ref 12–49)
Lymphocytes Absolute: 0.8 10*3/uL (ref 0.8–3.5)
MCH: 30.8 PG (ref 26.0–34.0)
MCHC: 33.5 g/dL (ref 30.0–36.5)
MCV: 92 FL (ref 80.0–99.0)
MPV: 9.8 FL (ref 8.9–12.9)
Monocytes %: 8 % (ref 5–13)
Monocytes Absolute: 0.6 10*3/uL (ref 0.0–1.0)
NRBC Absolute: 0 10*3/uL (ref 0.00–0.01)
Neutrophils %: 69 % (ref 32–75)
Neutrophils Absolute: 5.2 10*3/uL (ref 1.8–8.0)
Nucleated RBCs: 0 PER 100 WBC
Platelets: 346 10*3/uL (ref 150–400)
RBC: 2.5 M/uL — ABNORMAL LOW (ref 3.80–5.20)
RDW: 13.7 % (ref 11.5–14.5)
WBC: 7.6 10*3/uL (ref 3.6–11.0)

## 2019-12-11 MED ORDER — BACITRACIN 500 UNIT/G TOPICAL PACKET
500 unit/gram | CUTANEOUS | Status: AC
Start: 2019-12-11 — End: 2019-12-11
  Administered 2019-12-11: 19:00:00

## 2019-12-11 MED FILL — BACITRACIN 500 UNIT/G TOPICAL PACKET: 500 unit/gram | CUTANEOUS | Qty: 1

## 2019-12-11 MED FILL — OXYCODONE 5 MG TAB: 5 mg | ORAL | Qty: 1

## 2019-12-11 MED FILL — ACETAMINOPHEN 325 MG TABLET: 325 mg | ORAL | Qty: 2

## 2019-12-11 MED FILL — SENNA LAX 8.6 MG TABLET: 8.6 mg | ORAL | Qty: 1

## 2019-12-11 MED FILL — GABAPENTIN 100 MG CAP: 100 mg | ORAL | Qty: 1

## 2019-12-11 MED FILL — INSULIN GLARGINE 100 UNIT/ML INJECTION: 100 unit/mL | SUBCUTANEOUS | Qty: 1

## 2019-12-11 MED FILL — LACTULOSE 20 GRAM/30 ML ORAL SOLUTION: 20 gram/30 mL | ORAL | Qty: 30

## 2019-12-11 MED FILL — DOK 100 MG CAPSULE: 100 mg | ORAL | Qty: 1

## 2019-12-11 MED FILL — GABAPENTIN 300 MG CAP: 300 mg | ORAL | Qty: 1

## 2019-12-11 MED FILL — ENOXAPARIN 40 MG/0.4 ML SUB-Q SYRINGE: 40 mg/0.4 mL | SUBCUTANEOUS | Qty: 0.4

## 2019-12-11 MED FILL — ASPIRIN 81 MG TAB, DELAYED RELEASE: 81 mg | ORAL | Qty: 1

## 2019-12-11 MED FILL — PANTOPRAZOLE 40 MG TAB, DELAYED RELEASE: 40 mg | ORAL | Qty: 1

## 2019-12-11 MED FILL — INSULIN LISPRO 100 UNIT/ML INJECTION: 100 unit/mL | SUBCUTANEOUS | Qty: 1

## 2019-12-11 MED FILL — HYDROCHLOROTHIAZIDE 25 MG TAB: 25 mg | ORAL | Qty: 1

## 2019-12-11 MED FILL — LOSARTAN 50 MG TAB: 50 mg | ORAL | Qty: 1

## 2019-12-11 MED FILL — AMLODIPINE 5 MG TAB: 5 mg | ORAL | Qty: 1

## 2019-12-11 NOTE — Progress Notes (Signed)
Spiritual Care Assessment/Progress Note  [facility]      NAME: Lindsey Huynh      MRN: [id number]  AGE: 69 y.o. SEX: female  Religious Affiliation: Baptist   Language: English     12/11/2019     Total Time (in minutes): 5     Spiritual Assessment begun in [place] through conversation with:         [] Patient        []  Family    []  Friend(s)        Reason for Consult: Initial/Spiritual assessment, patient floor     Spiritual beliefs: (Please include comment if needed)     []  Identifies with a faith tradition:         []  Supported by a faith community:            []  Claims no spiritual orientation:           []  Seeking spiritual identity:                []  Adheres to an individual form of spirituality:           [x]  Not able to assess:                           Identified resources for coping:      []  Prayer                               []  Music                  []  Guided Imagery     []  Family/friends                 []  Pet visits     []  Devotional reading                         [x]  Unknown     []  Other:                                             Interventions offered during this visit: (See comments for more details)                Plan of Care:     []  Support spiritual and/or cultural needs    []  Support AMD and/or advance care planning process      []  Support grieving process   []  Coordinate Rites and/or Rituals    []  Coordination with community clergy   []  No spiritual needs identified at this time   []  Detailed Plan of Care below (See Comments)  []  Make referral to Music Therapy  []  Make referral to Pet Therapy     []  Make referral to Addiction services  []  Make referral to [place]  []  Make referral to Spiritual Care Partner  []  No future visits requested        [x]  Follow up upon further referrals     Comments: Chaplain visit for initial spiritual assessment.  Staff with patient providing care.  Door closed for privacy.  Please contact spiritual care for further referral or  consult.    Rev. Clifton James, MDiv, ThM, [facility]  Chaplain paging service:  [phone])

## 2019-12-11 NOTE — Progress Notes (Signed)
Francoise Schaumann, DPM - Greig Castilla K. 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,  pathology proximal margin negative    Post operative recommendations:   Pt can partial weight bear for short distances <5 steps in surgical shoe left foot  Dressings with adaptic, betadine, dry sterile dressing left foot, betadine and bandaid to ulcerations left calf; change 2-3 x /week  Surgical site appears to be healing with adequate perfusion  Pt is stable for discharge from a podiatry standpoint- planning d/c to SNF [facility])  ID following - Cefepime course completed, proximal margin without osteomyelitis  Pt to f/u with me 1 week after discharge for close post op f/u    Do not hesitate to contact us via [employer] or phone (personal [phone]) with any questions.    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  Musculoskeletal: no joint / extremity pain, misalignment, stiffness, decreased ROM, crepitus.  Integument: No pruritis, rashes, lesions, left foot and leg wounds.  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 Neuromuscular Blockers, Steroidal Other (comments)     GI Upset     History reviewed. No pertinent family history.   @HXMEDICAL @  Past Surgical History:   Procedure Laterality Date   ??? HX COLONOSCOPY     ??? HX ORTHOPAEDIC  06-30-10    back surgery (tumor removed)   ??? 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 Smoking status: Former Smoker     Years: 10.00   ??? Smokeless tobacco: Never Used   Substance Use Topics   ??? Alcohol use: Yes     Alcohol/week: 5.0 standard drinks     Types: 6 Cans of beer per week       Social History     Substance and Sexual Activity   Alcohol Use Yes   ??? Alcohol/week: 5.0 standard drinks   ??? Types: 6 Cans of beer per week     Social History     Substance and Sexual Activity   Drug Use No      Social History     Tobacco  Use   Smoking Status Former Smoker   ??? Years: 10.00   Smokeless Tobacco Never Used     Current Facility-Administered Medications   Medication Dose Route Frequency   ??? bacitracin 500 unit/gram packet       ??? amLODIPine (NORVASC) tablet 5 mg  5 mg Oral DAILY   ??? [Held by provider] losartan (COZAAR) tablet 50 mg  50 mg Oral DAILY   ??? lactulose (CHRONULAC) 10 gram/15 mL solution 30 mL  20 g Oral BID   ??? HYDROmorphone (PF) (DILAUDID) injection 0.5 mg  0.5 mg IntraVENous Q4H PRN   ??? alteplase (CATHFLO) 1 mg in sterile water (preservative free) 1 mL injection  1 mg InterCATHeter PRN   ??? oxyCODONE IR (ROXICODONE) tablet 10 mg  10 mg Oral Q4H PRN   ??? insulin glargine (LANTUS) injection 12 Units  12 Units SubCUTAneous QHS   ??? acetaminophen (TYLENOL) tablet 650 mg  650 mg Oral Q6H   ??? gabapentin (NEURONTIN) capsule 300 mg  300 mg Oral QHS   ??? gabapentin (NEURONTIN) capsule 100 mg  100 mg Oral DAILY   ??? sodium chloride (NS) flush 5-40 mL  5-40 mL IntraVENous Q8H   ??? sodium chloride (NS) flush 5-40 mL  5-40 mL IntraVENous PRN   ??? sodium phosphate (FLEET'S) enema 1 Enema  1 Enema  Rectal PRN   ??? oxyCODONE IR (ROXICODONE) tablet 5 mg  5 mg Oral Q4H PRN   ??? docusate sodium (COLACE) capsule 100 mg  100 mg Oral BID   ??? senna (SENOKOT) tablet 8.6 mg  1 Tab Oral DAILY   ??? polyethylene glycol (MIRALAX) packet 17 g  17 g Oral DAILY PRN   ??? bisacodyL (DULCOLAX) suppository 10 mg  10 mg Rectal DAILY PRN   ??? [Held by provider] hydroCHLOROthiazide (HYDRODIURIL) tablet 25 mg  25 mg Oral DAILY   ??? sodium chloride (NS) flush 5-40 mL  5-40 mL IntraVENous Q8H   ??? sodium chloride (NS) flush 5-40 mL  5-40 mL IntraVENous PRN   ??? acetaminophen (TYLENOL) suppository 650 mg  650 mg Rectal Q6H PRN   ??? promethazine (PHENERGAN) tablet 12.5 mg  12.5 mg Oral Q6H PRN    Or   ??? ondansetron (ZOFRAN) injection 4 mg  4 mg IntraVENous Q6H PRN   ??? glucose chewable tablet 16 g  4 Tab Oral PRN   ??? dextrose (D50W) injection syrg 12.5-25 g  25-50 mL IntraVENous PRN   ??? glucagon (GLUCAGEN) injection 1 mg  1 mg IntraMUSCular PRN   ??? insulin lispro (HUMALOG) injection   SubCUTAneous AC&HS   ??? aspirin delayed-release tablet 81 mg  81 mg Oral DAILY   ??? pantoprazole (PROTONIX) tablet 40 mg  40 mg Oral ACB   ??? enoxaparin (LOVENOX) injection 40 mg  40 mg SubCUTAneous Q24H        Objective:  Vitals:   Patient Vitals for the past 12 hrs:   BP Temp Pulse Resp SpO2   12/11/19 0934 106/64 98.6 ??F (37 ??C) 91 19 99 %   12/11/19 0528 (!) 155/84 98.8 ??F (37.1 ??C) 97 18 97 %       Vascular:  B/L LE  DP 0/4; PT 0/4  capillary fill time brisk, pitting edema is present, skin temperature is cool, varicosities are present.    Dermatological:  Nails are thickened, discolored, painful to palpation, 56mm thick, with subungual debris.          There is no maceration of the  interspaces of the feet b/l.  No focal hyperkeratosis    Wound: 1   Location: surgical incisions left forefoot and posterior Achilles  Margins: approximated with sutures intact  Drainage: none  Odor: none  Wound base: skin edges granular  Lymphangitic streaking? No.  Undermining? No.   Sinus tracts? No.  Exposed bone? No.  Subcutaneous crepitation on palpation? No.  No necrosis noted      Neurological:  DTR are present, protective sensation per 5.07 Semmes Weinstein monofilament is absent, patient is AAOx3, mood is normal. Epicritic sensation is intact.    Orthopedic:  B/L LE are symmetric, ROM of ankle, STJ, 1st MTPJ is limited, MMT 5 out of 5 for B/L LE.  No pedal amputations noted    Constitutional: Pt is a well developed elderly female.        Imaging:  Exam Information    Status Exam Begun  Exam Ended    Final [99] 12/05/2019 21:04 12/05/2019 ??9:08 PM [id number] ??9:08 PM   Result Information    Status: Final result (Exam End: 12/05/2019 21:08) Provider Status: Open   Study Result    EXAM: XR FOOT LT AP/LAT  ??  INDICATION: post op xray s/p transmetatarsal amputation.  ??  COMPARISON: None.  ??  FINDINGS: Two views of the left foot demonstrate transmetatarsal amputation. No  evidence of immediate complication.  ??  IMPRESSION  1. Transmetatarsal amputation.     Exam Information    Status Exam Begun  Exam Ended    Final [99] 11/29/2019 20:49 11/29/2019 ??9:17 PM [id number] ??9:17 PM   Result Information    Status: Final result (Exam End: 11/29/2019 21:17) Provider Status: Open   Study Result    PRELIMINARY REPORT  Soft tissue defect on the dorsal aspect of the first distal phalanx with  underlying marrow edema as seen on the STIR images.  ??  ??  Preliminary report was provided by Dr. Art Buff, the on-call radiologist, at  21:41 hours on 11/29/2019  ??  Final report to follow.  ??  FINAL REPORT BELOW  ??  EXAM:  MRI LOW EXT LT WO CONT  ??  INDICATION: Left foot infection. Diabetes.  ??  COMPARISON: CT left foot on 11/23/2019.  ??  TECHNIQUE: Axial, coronal, and sagittal MRI of the left forefoot in the T1 and  inversion recovery pulse sequences with and without fat saturation .  ??  CONTRAST: None.  ??  FINDINGS:   ??  Bone marrow: Subtle cortical loss of the great toe distal phalanx. Bone marrow  edema is in the great toe  distal phalanx.  ??  Remaining bone marrow signal is heterogeneous but within normal limits. No acute  fracture or osteonecrosis.  ??  Joint fluid: Physiologic.  ??  Tendons: Intact.  ??  Muscles: Mild atrophy. Moderate edema.  ??  Neurovascular bundles: No neuroma. Mild first and third intermetatarsal  bursitis.  ??  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 No septic  arthritis. Normal Lisfranc joint.  ??  Soft tissue mass: Mild cellulitis of the toes. No abscess.  ??  IMPRESSION  ??  1. First distal phalanx osteomyelitis.  2. No abscess.       Exam Information    Status Exam Begun  Exam Ended    Final [99] 11/23/2019 12:48 11/23/2019 12:52 PM [id number] 12:52 PM   Result Information    Status: Final result (Exam End: 11/23/2019 12:52) Provider Status: Open   Study Result  EXAM: XR FOOT LT MIN 3 V  ??  INDICATION: left foot pain.  ??  COMPARISON: None.  ??  FINDINGS: Three views of the left foot demonstrate no fracture or other acute  osseous or articular abnormality. The soft tissues are within normal limits.  Mild osteophytic changes noted, most prominent at the first metatarsophalangeal  joint.  ??  IMPRESSION  No acute abnormality.       11/28/2019  PROCEDURE PERFORMED:  Left mid superficial femoral artery to proximal peroneal artery bypass using nonreversed saphenous vein.    11/25/2019  PROCEDURE PERFORMED:  1.  Abdominal aortogram.  2.  Left leg runoff arteriogram with third order catheterization.  ??  SURGEON:  Belenda Cruise, MD  FINDINGS:  1.  Aortoiliac arteriogram:  The abdominal aorta and iliac arteries are patent without narrowing.  2.  Left leg runoff arteriogram:  The common femoral, profunda femoris, and superficial femoral arteries are patent.  There is no significant stenosis in the superficial femoral artery.  The SFA is occluded at its distal portion at the junction with the popliteal artery.  The tibioperoneal trunk reconstitutes with two-vessel posterior tibial and peroneal outflow  into the lower leg and foot.  The posterior tibial is a small diffusely narrow artery.  The peroneal artery is the best runoff vessel.  ??  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  The patient will need bypass surgery for limb salvage.      Noninvasive vascular studies   11/26/2019  Interpretation Summary    ?? Pressure measurements at the ankle are consistent with severe, bilateral, lower extremity arterial obstruction.      Lower Extremity Arterial Findings    ABI    ABI is severely abnormal on the right and the left.  PVR waveforms at the ankle are moderately to severely abnormal on the right and left.  PPG waveforms are flat (non-pulsatile) at the right and left great toe.     Results     Procedure Component Value Units Date/Time    CULTURE, BLOOD, PAIRED [id number] Collected: 12/07/19 0727    Order Status: Completed Specimen: Blood Updated: 12/11/19 0702     Special Requests: NO SPECIAL REQUESTS        Culture result: NO GROWTH 4 DAYS       CULTURE, BLOOD, PAIRED [id number] Collected: 12/07/19 0700    Order Status: Canceled Specimen: Blood     CULTURE, BLOOD, PAIRED [id number] Collected: 12/07/19 0700    Order Status: Canceled Specimen: Blood     RESPIRATORY VIRUS PANEL W/COVID-19, PCR [id number] Collected: 12/06/19 1125    Order Status: Completed Specimen: Nasopharyngeal Updated: 12/06/19 1248     Adenovirus Not detected        Coronavirus 229E Not detected        Coronavirus HKU1 Not detected        Coronavirus CVNL63 Not detected        Coronavirus OC43 Not detected        Metapneumovirus Not detected        Rhinovirus and Enterovirus Not detected        Influenza A Not detected        Influenza A, subtype H1 Not detected        Influenza A, subtype H3 Not detected        INFLUENZA A H1N1 PCR Not detected        Influenza B  Not detected        Parainfluenza 1 Not detected        Parainfluenza 2 Not detected         Parainfluenza 3 Not detected        Parainfluenza virus 4 Not detected        RSV by PCR Not detected        B. parapertussis, PCR Not detected        Bordetella pertussis - PCR Not detected        Chlamydophila pneumoniae DNA, QL, PCR Not detected        Mycoplasma pneumoniae DNA, QL, PCR Not detected        SARS-CoV-2, PCR Not detected       RESPIRATORY VIRUS PANEL W/COVID-19, PCR [id number]     Order Status: Canceled Specimen: NASOPHARYNGEAL SWAB     CULTURE, ANAEROBIC [id number] Collected: 12/05/19 1730    Order Status: Completed Specimen: Foot, left Updated: 12/08/19 0920     Special Requests: NO SPECIAL REQUESTS        Culture result: NO ANAEROBES ISOLATED       CULTURE, TISSUE Sid Falcon STAIN [id number]  (Abnormal)  (Susceptibility) Collected: 12/05/19 1730    Order Status: Completed Specimen: Foot, left Updated: 12/08/19 0920     Special Requests: DISTAL PHALANX BONE     GRAM STAIN RARE GRAM NEGATIVE RODS        Culture result:       LIGHT PSEUDOMONAS AERUGINOSA          Susceptibility      Pseudomonas aeruginosa     MIC     Amikacin ($) Susceptible     Cefepime ($$) Susceptible     Ceftazidime ($) Susceptible     Ciprofloxacin ($) Susceptible     Gentamicin ($) Susceptible     Levofloxacin ($) Susceptible     Meropenem ($$) Susceptible     Piperacillin/Tazobac ($) Susceptible [1]      Tobramycin ($) Susceptible            [1]   **FDA INTERPRETATION REFLECTED, REFER TO [employer] FOR ALTERNATE INTERPRETATIONS.**                     PATHOLOGY     FINAL PATHOLOGIC DIAGNOSIS   1. Bone, proximal margin left 1st ray:   Portion of bone with no evidence of active osteomyelitis.   2. Left forefoot, transmetatarsal amputation:   Ulceration with necrosis and acute inflammation extending into underlying bone consistent with active osteomyelitis.       Labs:  Recent Labs     12/11/19  0530   WBC 7.6   CREA 0.84   BUN 11   HGB 7.7*   HCT 23.0*   NA 134*   K 4.3   CL 105   CO2 22   GLU 115*

## 2019-12-11 NOTE — Progress Notes (Signed)
Bedside and Verbal shift change report given to [place] (oncoming nurse) by Genelle Bal (offgoing nurse). Report included the following information SBAR, Kardex and MAR.

## 2019-12-11 NOTE — Progress Notes (Signed)
Vascular:    Left leg incisions OK, every other staple to be removed today.

## 2019-12-11 NOTE — Progress Notes (Signed)
Bedside shift change report given to Lorrine Kin, RN (oncoming nurse) by Annabell Sabal (offgoing nurse). Report included the following information SBAR, Kardex, Intake/Output, MAR and Recent Results.

## 2019-12-11 NOTE — Progress Notes (Signed)
Progress Notes by [name], DPM at 12/11/19 1232                Author: [name], DPM  Service: Podiatry  Author Type: Physician       Filed: 12/11/19 1234  Date of Service: 12/11/19 1232  Status: Signed          Editor: [name], DPM (Physician)                                                                     Francoise Schaumann, DPM - Despina Pole. 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,  pathology proximal margin negative      Post operative recommendations:    Pt can partial weight bear for short distances <5 steps in surgical shoe left foot   Dressings with adaptic, betadine, dry sterile dressing left foot, betadine and bandaid to ulcerations left calf; change 2-3 x /week   Surgical site appears to be healing with adequate perfusion   Pt is stable for discharge from a podiatry standpoint- planning d/c to SNF [facility])   ID following - Cefepime course completed, proximal margin without osteomyelitis   Pt to f/u with me 1 week after discharge for close post op f/u      Do not hesitate to contact us via [employer] or phone (personal [phone]) with any questions.      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   Musculoskeletal: no joint / extremity pain, misalignment, stiffness, decreased ROM, crepitus.   Integument: No pruritis, rashes, lesions, left foot and leg wounds.   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  Neuromuscular Blockers, Steroidal  Other (comments)  GI Upset        History reviewed. No pertinent family history.    @HXMEDICAL @     Past Surgical History:         Procedure  Laterality  Date          ?  HX COLONOSCOPY         ?  HX ORTHOPAEDIC    06-30-10          back surgery (tumor removed)          ?  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  Smoking status:  Former Smoker              Years:  10.00         ?  Smokeless tobacco:  Never Used       Substance Use Topics         ?  Alcohol use:  Yes              Alcohol/week:  5.0 standard drinks              Types:  6 Cans of beer per week            Social History          Substance and Sexual Activity        Alcohol Use  Yes         ?  Alcohol/week:  5.0 standard drinks         ?  Types:  6 Cans of beer per week          Social History          Substance and Sexual Activity        Drug Use  No           Social History          Tobacco Use        Smoking Status  Former Smoker         ?  Years:  10.00        Smokeless Tobacco  Never Used          Current Facility-Administered Medications          Medication  Dose  Route  Frequency           ?  bacitracin 500 unit/gram packet            ?  amLODIPine (NORVASC) tablet 5 mg   5 mg  Oral  DAILY     ?  [Held by provider] losartan (COZAAR) tablet 50 mg   50 mg  Oral  DAILY     ?  lactulose (CHRONULAC) 10 gram/15 mL solution 30 mL   20 g  Oral  BID           ?  HYDROmorphone (PF) (DILAUDID) injection 0.5 mg   0.5 mg   IntraVENous  Q4H PRN           ?  alteplase (CATHFLO) 1 mg in sterile water (preservative free) 1 mL injection   1 mg  InterCATHeter  PRN     ?  oxyCODONE IR (ROXICODONE) tablet 10 mg   10 mg  Oral  Q4H  PRN     ?  insulin glargine (LANTUS) injection 12 Units   12 Units  SubCUTAneous  QHS     ?  acetaminophen (TYLENOL) tablet 650 mg   650 mg  Oral  Q6H     ?  gabapentin (NEURONTIN) capsule 300 mg   300 mg  Oral  QHS     ?  gabapentin (NEURONTIN) capsule 100 mg   100 mg  Oral  DAILY     ?  sodium chloride (NS) flush 5-40 mL   5-40 mL  IntraVENous  Q8H     ?  sodium chloride (NS) flush 5-40 mL   5-40 mL  IntraVENous  PRN     ?  sodium phosphate (FLEET'S) enema 1 Enema   1 Enema  Rectal  PRN     ?  oxyCODONE IR (ROXICODONE) tablet 5 mg   5 mg  Oral  Q4H PRN     ?  docusate sodium (COLACE) capsule 100 mg   100 mg  Oral  BID     ?  senna (SENOKOT) tablet 8.6 mg   1 Tab  Oral  DAILY     ?  polyethylene glycol (MIRALAX) packet 17 g   17 g  Oral  DAILY PRN     ?  bisacodyL (DULCOLAX) suppository 10 mg   10 mg  Rectal  DAILY PRN     ?  [Held by provider] hydroCHLOROthiazide (HYDRODIURIL) tablet 25 mg   25 mg  Oral  DAILY     ?  sodium chloride (NS) flush 5-40 mL   5-40 mL  IntraVENous  Q8H     ?  sodium chloride (NS) flush 5-40 mL   5-40 mL  IntraVENous  PRN     ?  acetaminophen (TYLENOL) suppository 650 mg   650 mg  Rectal  Q6H PRN     ?  promethazine (PHENERGAN) tablet 12.5 mg   12.5 mg  Oral  Q6H PRN          Or           ?  ondansetron (ZOFRAN) injection 4 mg   4 mg  IntraVENous  Q6H PRN           ?  glucose chewable tablet 16 g   4 Tab  Oral  PRN     ?  dextrose (D50W) injection syrg 12.5-25 g   25-50 mL  IntraVENous  PRN     ?  glucagon (GLUCAGEN) injection 1 mg   1 mg  IntraMUSCular  PRN     ?  insulin lispro (HUMALOG) injection     SubCUTAneous  AC&HS     ?  aspirin delayed-release tablet 81 mg   81 mg  Oral  DAILY     ?  pantoprazole (PROTONIX) tablet 40 mg   40 mg  Oral  ACB           ?  enoxaparin (LOVENOX)  injection 40 mg   40 mg  SubCUTAneous  Q24H            Objective:   Vitals:    Patient Vitals for the past 12 hrs:            BP  Temp  Pulse  Resp  SpO2            12/11/19 0934  106/64  98.6 ??F (37 ??C)  91  19  99 %  12/11/19 0528  (!) 155/84  98.8 ??F (37.1 ??C)  97  18  97 %           Vascular:   B/L LE   DP 0/4; PT 0/4   capillary fill time brisk, pitting edema is present, skin temperature is cool, varicosities are present.      Dermatological:   Nails are thickened, discolored, painful to palpation, 4mm thick, with subungual debris.              There is no maceration of the interspaces of the feet b/l.  No focal hyperkeratosis      Wound: 1    Location: surgical incisions left forefoot and posterior Achilles   Margins: approximated with sutures intact   Drainage: none   Odor: none   Wound base: skin edges granular   Lymphangitic streaking? No.   Undermining? No.   Sinus tracts? No.   Exposed bone? No.   Subcutaneous crepitation on palpation? No.   No necrosis noted         Neurological:   DTR are present, protective sensation per 5.07 Semmes Weinstein monofilament is absent, patient is AAOx3, mood is normal. Epicritic sensation is intact.      Orthopedic:   B/L LE are symmetric, ROM of ankle, STJ, 1st MTPJ is limited, MMT 5 out of 5 for B/L LE.  No pedal amputations noted      Constitutional: Pt is a well developed elderly female.            Imaging:   Exam Information            Status  Exam Begun    Exam Ended             Final [99]  12/05/2019  21:04  12/05/2019 ??9:08 PM  [id number] ??9:08 PM     Result Information         Status: Final result (Exam End: 12/05/2019 21:08)  Provider Status: Open     Study Result        EXAM: XR FOOT LT AP/LAT   ??   INDICATION: post op xray s/p transmetatarsal amputation.   ??   COMPARISON: None.   ??   FINDINGS: Two views of the left foot demonstrate transmetatarsal amputation. No   evidence of immediate complication.   ??   IMPRESSION   1. Transmetatarsal amputation.         Exam Information            Status  Exam Begun    Exam Ended             Final [99]  11/29/2019  20:49  11/29/2019 ??9:17 PM  [id number] ??9:17 PM     Result Information         Status: Final result (Exam End: 11/29/2019 21:17)  Provider Status: Open     Study Result        PRELIMINARY REPORT   Soft tissue defect on the dorsal aspect of the first distal phalanx with   underlying marrow edema as seen on the STIR images.   ??   ??   Preliminary report was provided by Dr. Art Buff, the on-call radiologist, at   21:41 hours on 11/29/2019   ??   Final report to follow.   ??   FINAL REPORT BELOW   ??   EXAM:  MRI LOW EXT LT WO CONT   ??   INDICATION: Left foot infection. Diabetes.   ??  COMPARISON: CT left foot on 11/23/2019.   ??   TECHNIQUE: Axial, coronal, and sagittal MRI of the left forefoot in the T1 and   inversion recovery pulse sequences with and without fat saturation .   ??   CONTRAST: None.   ??   FINDINGS:    ??   Bone marrow: Subtle cortical loss of the great toe distal phalanx. Bone marrow   edema is in the great toe distal phalanx.   ??   Remaining bone marrow signal is heterogeneous but within normal limits. No acute   fracture or osteonecrosis.   ??   Joint fluid: Physiologic.   ??   Tendons: Intact.   ??   Muscles: Mild atrophy. Moderate edema.   ??   Neurovascular bundles: No neuroma. Mild first and third intermetatarsal   bursitis.   ??   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 No septic   arthritis. Normal Lisfranc joint.   ??   Soft tissue mass: Mild cellulitis of the toes. No abscess.   ??   IMPRESSION   ??   1. First distal phalanx osteomyelitis.   2. No abscess.           Exam Information            Status  Exam Begun    Exam Ended             Final [99]  11/23/2019  12:48  11/23/2019 12:52 PM  [id number] 12:52 PM     Result Information         Status: Final result (Exam End: 11/23/2019 12:52)  Provider Status: Open     Study Result        EXAM: XR FOOT LT MIN 3 V   ??   INDICATION: left foot pain.    ??   COMPARISON: None.   ??   FINDINGS: Three views of the left foot demonstrate no fracture or other acute   osseous or articular abnormality. The soft tissues are within normal limits.   Mild osteophytic changes noted, most prominent at the first metatarsophalangeal   joint.   ??   IMPRESSION   No acute abnormality.           11/28/2019   PROCEDURE PERFORMED:  Left mid superficial femoral artery to proximal peroneal artery bypass using nonreversed saphenous vein.      11/25/2019   PROCEDURE PERFORMED:   1.  Abdominal aortogram.   2.  Left leg runoff arteriogram with third order catheterization.   ??   SURGEON:  Belenda Cruise, MD   FINDINGS:   1.  Aortoiliac arteriogram:  The abdominal aorta and iliac arteries are patent without narrowing.   2.  Left leg runoff arteriogram:  The common femoral, profunda femoris, and superficial femoral arteries are patent.  There is no significant stenosis in the superficial femoral artery.  The SFA is occluded at its distal portion at the junction with  the popliteal artery.  The tibioperoneal trunk reconstitutes with two-vessel posterior tibial and peroneal outflow into the lower leg and foot.  The posterior tibial is a small diffusely narrow artery.  The peroneal artery is the best runoff vessel.   ??   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  The patient will need bypass surgery for limb salvage.         Noninvasive vascular studies  11/26/2019   Interpretation Summary      ??    Pressure measurements at the ankle are consistent with severe, bilateral, lower extremity arterial obstruction.         Lower Extremity Arterial Findings      ABI        ABI is severely abnormal on the right and the left.  PVR waveforms at the ankle are moderately to severely abnormal on the right and  left.  PPG waveforms are flat (non-pulsatile) at the right and left great toe.          Results                Procedure  Component  Value  Units  Date/Time           CULTURE, BLOOD, PAIRED [id number]  Collected: 12/07/19 0727            Order Status: Completed  Specimen: Blood  Updated: 12/11/19 0702                Special Requests:  NO SPECIAL REQUESTS              Culture result:  NO GROWTH 4 DAYS                CULTURE, BLOOD, PAIRED [id number]  Collected: 12/07/19 0700            Order Status: Canceled  Specimen: Blood             CULTURE, BLOOD, PAIRED [id number]  Collected: 12/07/19 0700            Order Status: Canceled  Specimen: Blood             RESPIRATORY VIRUS PANEL W/COVID-19, PCR [id number]  Collected: 12/06/19 1125            Order Status: Completed  Specimen: Nasopharyngeal  Updated: 12/06/19 1248                Adenovirus  Not detected              Coronavirus 229E  Not detected              Coronavirus HKU1  Not detected              Coronavirus CVNL63  Not detected              Coronavirus OC43  Not detected              Metapneumovirus  Not detected              Rhinovirus and Enterovirus  Not detected              Influenza A  Not detected              Influenza A, subtype H1  Not detected              Influenza A, subtype H3  Not detected              INFLUENZA A H1N1 PCR  Not detected              Influenza B  Not detected              Parainfluenza 1  Not detected              Parainfluenza 2  Not detected              Parainfluenza  3  Not detected              Parainfluenza virus 4  Not detected              RSV by PCR  Not detected              B. parapertussis, PCR  Not detected              Bordetella pertussis - PCR  Not detected              Chlamydophila pneumoniae DNA, QL, PCR  Not detected                     Mycoplasma pneumoniae DNA, QL, PCR  Not detected                     SARS-CoV-2, PCR  Not detected                RESPIRATORY VIRUS PANEL W/COVID-19, PCR [id number]              Order Status: Canceled  Specimen: NASOPHARYNGEAL SWAB             CULTURE, ANAEROBIC [id number]   Collected: 12/05/19 1730            Order Status: Completed  Specimen: Foot, left  Updated: 12/08/19 0920                Special Requests:  NO SPECIAL REQUESTS              Culture result:  NO ANAEROBES ISOLATED                CULTURE, TISSUE Sid Falcon STAIN [id number]  (Abnormal)  (Susceptibility)  Collected: 12/05/19 1730            Order Status: Completed  Specimen: Foot, left  Updated: 12/08/19 0920              Special Requests:  DISTAL PHALANX BONE                GRAM STAIN  RARE GRAM NEGATIVE RODS                    Culture result:                 LIGHT PSEUDOMONAS AERUGINOSA                      Susceptibility           Pseudomonas aeruginosa          MIC          Amikacin ($)  Susceptible          Cefepime ($$)  Susceptible          Ceftazidime ($)  Susceptible          Ciprofloxacin ($)  Susceptible          Gentamicin ($)  Susceptible          Levofloxacin ($)  Susceptible          Meropenem ($$)  Susceptible          Piperacillin/Tazobac ($)  Susceptible [1]           Tobramycin ($)  Susceptible                       [1]     **  FDA INTERPRETATION REFLECTED, REFER TO CLSI FOR ALTERNATE INTERPRETATIONS.**                                          PATHOLOGY       FINAL PATHOLOGIC DIAGNOSIS    1. Bone, proximal margin left 1st ray:    Portion of bone with no evidence of active osteomyelitis.    2. Left forefoot, transmetatarsal amputation:    Ulceration with necrosis and acute inflammation extending into underlying bone consistent with active osteomyelitis.          Labs:     Recent Labs           12/11/19   0530     WBC  7.6     CREA  0.84     BUN  11     HGB  7.7*     HCT  23.0*     NA  134*     K  4.3     CL  105     CO2  22        GLU  115*

## 2019-12-11 NOTE — Group Note (Signed)
Diabetes Management Team to sign off at this point as patient's blood glucose remains stable.  Please re-consult [place] if patient needs change.  Thank you for including [place] in their care.       Signed By: Lily Lovings, CNS   [facility]    December 11, 2019

## 2019-12-11 NOTE — Progress Notes (Signed)
TOC: Auth still pending for [place] was started on Friday. Called and spoke to [place] is still pending.     Insurance has requested for OT to see. Orders added today.     RUR: 17%    CM following for discharge needs.    Nathanial Millman RN/CRM

## 2019-12-11 NOTE — Progress Notes (Signed)
Spiritual Care Assessment/Progress Note  [facility]      NAME: Lindsey Huynh      MRN: [id number]  AGE: 69 y.o. SEX: female  Religious Affiliation: Baptist   Language: English     12/11/2019     Total Time (in minutes): 5     Spiritual Assessment begun in [place] through conversation with:         [] Patient        []  Family    []  Friend(s)        Reason for Consult: Initial/Spiritual assessment, patient floor     Spiritual beliefs: (Please include comment if needed)     []  Identifies with a faith tradition:         []  Supported by a faith community:            []  Claims no spiritual orientation:           []  Seeking spiritual identity:                []  Adheres to an individual form of spirituality:           [x]  Not able to assess:                           Identified resources for coping:      []  Prayer                               []  Music                  []  Guided Imagery     []  Family/friends                 []  Pet visits     []  Devotional reading                         [x]  Unknown     []  Other:                                             Interventions offered during this visit: (See comments for more details)                Plan of Care:     []  Support spiritual and/or cultural needs    []  Support AMD and/or advance care planning process      []  Support grieving process   []  Coordinate Rites and/or Rituals    []  Coordination with community clergy   []  No spiritual needs identified at this time   []  Detailed Plan of Care below (See Comments)  []  Make referral to Music Therapy  []  Make referral to Pet Therapy     []  Make referral to Addiction services  []  Make referral to [place]  []  Make referral to Spiritual Care Partner  []  No future visits requested        [x]  Follow up upon further referrals     Comments: Chaplain visit for initial spiritual assessment.  Staff with patient providing care.  Door closed for privacy.  Please contact spiritual care for further referral or  consult.    Rev. Clifton James, MDiv, ThM, [facility]  Chaplain paging service:  [phone])

## 2019-12-11 NOTE — Progress Notes (Signed)
Progress Notes by [name], MD at 12/11/19 1148                Author: [name], MD  Service: Hospitalist  Author Type: Physician       Filed: 12/11/19 1150  Date of Service: 12/11/19 1148  Status: Signed          Editor: [name], MD (Physician)                    [facility]                                                                                              Hospitalist Progress Note   [name], MD   Answering service: [phone] OR [phone] from in house phone            Date of Service:  12/11/2019   NAME:  Lindsey Huynh   DOB:  06/18/1951   MRN:  [id number]           Admission Summary:           "Lindsey Huynh??is a 69 y.o.??female??with past medical history of diabetes, insulin-dependent, GERD and hypertension comes for return of left foot pain and swelling.      Interval history / Subjective:          F/U for left foot cellulitis, left great toe gangrene, DMII, HTN, GERD, Hypokalemia, left popliteal occlusion s/p left fem-pop bypass   S/P TMA by podiatry and pt is  cleared for d/c surgical path no osteo completed antibiotics   [facility] authorization          Assessment & Plan:          Left great toe Osteomyelitis    PAD: Status post femoropopliteal bypass on November 28, 2019      - MRI LLE: 1. First distal phalanx osteomyelitis.   - IV antibiotics PER ID light pseudomonas and surgical margin clear completed antibiotics cefepime DC PICC line   - Vascular surgery following- Patient to follow up in 2 weeks post- op for staple removal.    - Podiatry following -TMA POD 2      LLE pain:??dry gangrene??of??left 2nd toe cellulitic changes/Left popliteal occlusion   -s/p left fem-pop bypass (03/11)   -CT LLE (03/06) NL   -Elevated Sed rate and CRP   -BC (03/06) NGTD    -Pain control   - MRI - 1. First distal phalanx osteomyelitis.. No abscess.s/p amputation wound care    -Alternate staples were removed today   ??   Anemia     Stable, No sign of bleeding noted. hgb dropped    - hgb 7.7 today - Monitor labs and transfuse if hgb <7.0    ??   DMII:??hold metformin while IP   -Cont basal insulin, SSI   -Diabetic diet   -a1c 8.1   - DM following??   ??   HTN low BP hold meds < 100/60   -Continue amlodipine holding Cozaar and HCTZ   ??  GERD   -Cont PPI   ??   Hypokalemia   -potassium 3.2 replete PRN      Code status: Full    DVT prophylaxis: Lovenox       Care Plan discussed with: Patient/Family, Nurse and Case Manager   Anticipated Disposition: ready for d/c    Anticipated Discharge: [facility] soon  COVID retest neg            Hospital Problems   Date Reviewed:  05/31/2018                         Codes  Class  Noted  POA              Gangrene ([place])  ICD-10-CM: [id number]   ICD-9-CM: 785.4    11/23/2019  Unknown                               Review of Systems:     A comprehensive review of systems was negative except for that written in the HPI.       Xr Foot Lt Ap/lat      Result Date: 12/05/2019   1. Transmetatarsal amputation.      Xr Foot Lt Min 3 V      Result Date: 11/23/2019   No acute abnormality.      Mri Low Ext Lt Wo Cont      Result Date: 12/01/2019   1. First distal phalanx osteomyelitis. 2. No abscess.       Ct Low Ext Lt W Cont      Result Date: 11/24/2019   No CT imaging evidence for soft tissue gas, abscess or osteomyelitis.      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 Fluoro Dose:  144.09 mGy. vk        Vital Signs:      Last 24hrs VS reviewed since prior progress note. Most recent are:   Visit Vitals      BP  106/64 (BP 1 Location: Left upper arm, BP Patient Position: At rest)     Pulse  91     Temp  98.6 ??F (37 ??C)     Resp  19     Ht  5\' 5"  (1.651 m)     Wt  76.7 kg (169 lb)     SpO2  99%        BMI  28.12 kg/m??              Intake/Output Summary (Last 24 hours) at 12/11/2019 1148   Last data filed at 12/10/2019 1747     Gross per 24 hour        Intake  --        Output  300 ml        Net  -300 ml               Physical Examination:                     Constitutional:   No acute distress, cooperative     ENT:   Oral mucosa moist, oropharynx benign.      Resp:   CTA bilaterally.     CV:   Regular rhythm, normal rate,          GI:   Soft, non  distended, non tender.bs+         Musculoskeletal:   LLE dressing c/d  staples intact         Neurologic:   Moves all extremities.  AAOx3, CN II-XII reviewed, follows commands       Psych:  Good insight, Not anxious nor agitated.         ??                       Data Review:      Review and/or order of clinical lab test   Review and/or order of tests in the medicine section of CPT           Labs:          Recent Labs            12/11/19   0530  12/10/19   0532     WBC  7.6  6.7     HGB  7.7*  7.0*     HCT  23.0*  21.2*         PLT  346  315          Recent Labs             12/11/19   0530  12/10/19   0532  12/09/19   0253     NA  134*  137  139     K  4.3  3.9  3.1*     CL  105  108  105     CO2  22  24  26      BUN  11  12  15      CREA  0.84  0.90  1.03*     GLU  115*  84  145*          CA  9.0  9.1  9.0        No results for input(s): ALT, AP, TBIL, TBILI, TP, ALB, GLOB, GGT, AML, LPSE in the last 72 hours.      No lab exists for component: SGOT, GPT, AMYP, HLPSE   No results for input(s): INR, PTP, APTT, INREXT, INREXT in the last 72 hours.    No results for input(s): FE, TIBC, PSAT, FERR in the last 72 hours.    No results found for: FOL, RBCF    No results for input(s): PH, PCO2, PO2 in the last 72 hours.   No results for input(s): CPK, CKNDX, TROIQ in the last 72 hours.      No lab exists for component: CPKMB     Lab Results         Component  Value  Date/Time            Cholesterol, total  219 (H)  10/30/2018 10:27 AM       HDL Cholesterol  63  10/30/2018 10:27 AM       LDL, calculated  130 (H)  10/30/2018 10:27 AM       Triglyceride  131  10/30/2018 10:27 AM            CHOL/HDL Ratio  3.4  12/26/2007 09:12 AM          Lab Results         Component  Value  Date/Time             Glucose (POC)  196 (H)  12/11/2019 11:32 AM       Glucose (POC)  132 (H)  12/11/2019 06:30 AM       Glucose (POC)  155 (H)  12/10/2019 09:50 PM       Glucose (POC)  94  12/10/2019 04:15 PM            Glucose (POC)  146 (H)  12/10/2019 11:30 AM        No results found for: COLOR, APPRN, SPGRU, REFSG, PHU, PROTU, GLUCU, KETU, BILU, UROU, NITU, LEUKU, GLUKE, EPSU, BACTU, WBCU, RBCU, CASTS, UCRY           Medications Reviewed:          Current Facility-Administered Medications          Medication  Dose  Route  Frequency           ?  amLODIPine (NORVASC) tablet 5 mg   5 mg  Oral  DAILY     ?  [Held by provider] losartan (COZAAR) tablet 50 mg   50 mg  Oral  DAILY     ?  lactulose (CHRONULAC) 10 gram/15 mL solution 30 mL   20 g  Oral  BID     ?  HYDROmorphone (PF) (DILAUDID) injection 0.5 mg   0.5 mg  IntraVENous  Q4H PRN     ?  alteplase (CATHFLO) 1 mg in sterile water (preservative free) 1 mL injection   1 mg  InterCATHeter  PRN     ?  oxyCODONE IR (ROXICODONE) tablet 10 mg   10 mg  Oral  Q4H PRN     ?  insulin glargine (LANTUS) injection 12 Units   12 Units  SubCUTAneous  QHS     ?  acetaminophen (TYLENOL) tablet 650 mg   650 mg  Oral  Q6H     ?  gabapentin (NEURONTIN) capsule 300 mg   300 mg  Oral  QHS     ?  gabapentin (NEURONTIN) capsule 100 mg   100 mg  Oral  DAILY     ?  sodium chloride (NS) flush 5-40 mL   5-40 mL  IntraVENous  Q8H           ?  sodium chloride (NS) flush 5-40 mL   5-40 mL  IntraVENous  PRN           ?  sodium phosphate (FLEET'S) enema 1 Enema   1 Enema  Rectal  PRN     ?  oxyCODONE IR (ROXICODONE) tablet 5 mg   5 mg  Oral  Q4H PRN     ?  docusate sodium (COLACE) capsule 100 mg   100 mg  Oral  BID     ?  senna (SENOKOT) tablet 8.6 mg   1 Tab  Oral  DAILY     ?  polyethylene glycol (MIRALAX) packet 17 g   17 g  Oral  DAILY PRN     ?  bisacodyL (DULCOLAX) suppository 10 mg   10 mg  Rectal  DAILY PRN     ?  [Held by provider] hydroCHLOROthiazide (HYDRODIURIL) tablet 25 mg   25 mg  Oral  DAILY     ?   sodium chloride (NS) flush 5-40 mL   5-40 mL  IntraVENous  Q8H     ?  sodium chloride (NS) flush 5-40 mL   5-40 mL  IntraVENous  PRN     ?  acetaminophen (TYLENOL) suppository 650 mg   650 mg  Rectal  Q6H PRN     ?  promethazine (PHENERGAN) tablet 12.5 mg   12.5 mg  Oral  Q6H PRN          Or           ?  ondansetron (ZOFRAN) injection 4 mg   4 mg  IntraVENous  Q6H PRN     ?  glucose chewable tablet 16 g   4 Tab  Oral  PRN     ?  dextrose (D50W) injection syrg 12.5-25 g   25-50 mL  IntraVENous  PRN     ?  glucagon (GLUCAGEN) injection 1 mg   1 mg  IntraMUSCular  PRN     ?  insulin lispro (HUMALOG) injection     SubCUTAneous  AC&HS     ?  aspirin delayed-release tablet 81 mg   81 mg  Oral  DAILY     ?  pantoprazole (PROTONIX) tablet 40 mg   40 mg  Oral  ACB           ?  enoxaparin (LOVENOX) injection 40 mg   40 mg  SubCUTAneous  Q24H        ______________________________________________________________________   EXPECTED LENGTH OF STAY: 4d 2h   ACTUAL LENGTH OF STAY:          18                    [name], MD

## 2019-12-11 NOTE — Progress Notes (Signed)
Bedside and Verbal shift change report given to Otila Kluver, Therapist, sports (Soil scientist) by Ria Clock, RN (offgoing nurse). Report included the following information SBAR, Procedure Summary, MAR and Recent Results.

## 2019-12-11 NOTE — Progress Notes (Signed)
Physical Therapy  Attempted PT treatment.  Patient requesting to rest at the moment and is awaiting pain medicine.  Patient states she will sit EOB later today on her own and will be willing to mobilize tmrw with PT.  Will defer and continue to follow.  Shirley Friar, PT, DPT

## 2019-12-11 NOTE — Progress Notes (Signed)
Bedside and Verbal shift change report given to Harlow Ohms RN (oncoming nurse) by Lynwood Dawley (offgoing nurse). Report included the following information SBAR, Kardex and MAR.

## 2019-12-11 NOTE — Progress Notes (Signed)
Patient BP 106/64 HR 90. Perfect[employer] sent to Dr. Lorene Dy to inquire if 5 mg of Amlodipine, 50 mg Losartan, and 25 mg HCTZ should be given    0937 Hold all per Dr. Lorene Dy

## 2019-12-11 NOTE — Progress Notes (Addendum)
TOC: Auth still pending for Lindsey Huynh was started on Friday. Called and spoke to [name] is still pending.     Insurance has requested for OT to see. Orders added today.     RUR: 17%    CM following for discharge needs.    Nathanial Millman RN/CRM

## 2019-12-11 NOTE — Other (Signed)
Diabetes Management Team to sign off at this point as patient's blood glucose remains stable.  Please re-consult [place] if patient needs change.  Thank you for including [place] in their care.       Signed By: Lily Lovings, CNS   [facility]    December 11, 2019

## 2019-12-11 NOTE — Progress Notes (Signed)
[facility]                                                                                          Hospitalist Progress Note  [name], MD  Answering service: [phone] OR [phone] from in house phone        Date of Service:  12/11/2019  NAME:  Lindsey Huynh  DOB:  08-11-1951  MRN:  [id number]      Admission Summary:     "Jaide Klepacki??is a 69 y.o.??female??with past medical history of diabetes, insulin-dependent, GERD and hypertension comes for return of left foot pain and swelling.    Interval history / Subjective:     F/U for left foot cellulitis, left great toe gangrene, DMII, HTN, GERD, Hypokalemia, left popliteal occlusion s/p left fem-pop bypass  S/P TMA by podiatry and pt is  cleared for d/c surgical path no osteo completed antibiotics  Pt will go to [facility] care team authorization     Assessment & Plan:     Left great toe Osteomyelitis   PAD: Status post femoropopliteal bypass on November 28, 2019    - MRI LLE: 1. First distal phalanx osteomyelitis.  - IV antibiotics PER ID light pseudomonas and surgical margin clear completed antibiotics cefepime DC PICC line  - Vascular surgery following- Patient to follow up in 2 weeks post- op for staple removal.   - Podiatry following -TMA POD 2    LLE pain:??dry gangrene??of??left 2nd toe cellulitic changes/Left popliteal occlusion  -s/p left fem-pop bypass (03/11)  -CT LLE (03/06) NL  -Elevated Sed rate and CRP  -BC (03/06) NGTD   -Pain control  - MRI - 1. First distal phalanx osteomyelitis.. No abscess.s/p amputation wound care   -Alternate staples were removed today  ??  Anemia   Stable, No sign of bleeding noted. hgb dropped   - hgb 7.7 today - Monitor labs and transfuse if hgb <7.0   ??  DMII:??hold metformin while IP  -Cont basal insulin, SSI  -Diabetic diet  -a1c 8.1  - DM following??  ??  HTN low BP hold meds < 100/60  -Continue amlodipine holding Cozaar and HCTZ  ??  GERD  -Cont PPI  ??  Hypokalemia  -potassium 3.2 replete PRN     Code status: Full   DVT prophylaxis: Lovenox     Care Plan discussed with: Patient/Family, Nurse and Case Manager  Anticipated Disposition: ready for d/c   Anticipated Discharge: [facility] soon  COVID retest neg      Hospital Problems  Date Reviewed: 06-01-2018          Codes Class Noted POA    Gangrene ([place]) ICD-10-CM: [id number]  ICD-9-CM: 785.4  11/23/2019 Unknown                Review of Systems:   A comprehensive review of systems was negative except for that written in the HPI.     Xr Foot Lt Ap/lat    Result Date: 12/05/2019  1. Transmetatarsal amputation.    Xr Foot Lt Min 3 V    Result Date: 11/23/2019  No acute abnormality.    Mri Low Ext Lt Wo Cont    Result Date: 12/01/2019  1. First distal phalanx osteomyelitis. 2. No abscess.     Ct Low Ext Lt W Cont    Result Date: 11/24/2019  No CT imaging evidence for soft tissue gas, abscess or osteomyelitis.    Retail banker Technologist Service    Result Date: 11/25/2019  FLUOROSCOPY WAS USED. Fluoro Dose:  144.09 mGy. vk    Vital Signs:    Last 24hrs VS reviewed since prior progress note. Most recent are:  Visit Vitals  BP 106/64 (BP 1 Location: Left upper arm, BP Patient Position: At rest)   Pulse 91   Temp 98.6 ??F (37 ??C)   Resp 19   Ht 5\' 5"  (1.651 m)   Wt 76.7 kg (169 lb)   SpO2 99%   BMI 28.12 kg/m??         Intake/Output Summary (Last 24 hours) at 12/11/2019 1148  Last data filed at 12/10/2019 1747  Gross per 24 hour   Intake ???   Output 300 ml   Net -300 ml        Physical Examination:             Constitutional:  No acute distress, cooperative   ENT:  Oral mucosa moist, oropharynx benign.    Resp:  CTA bilaterally.   CV:  Regular rhythm, normal rate,     GI:  Soft, non distended, non tender.bs+    Musculoskeletal:  LLE dressing c/d  staples intact    Neurologic:  Moves all extremities.  AAOx3, CN II-XII reviewed, follows commands     Psych:  Good insight, Not anxious nor agitated.     ??            Data Review:    Review and/or order of clinical lab test  Review and/or order of  tests in the medicine section of CPT      Labs:     Recent Labs     12/11/19  0530 12/10/19  0532   WBC 7.6 6.7   HGB 7.7* 7.0*   HCT 23.0* 21.2*   PLT 346 315     Recent Labs     12/11/19  0530 12/10/19  0532 12/09/19  0253   NA 134* 137 139   K 4.3 3.9 3.1*   CL 105 108 105   CO2 22 24 26    BUN 11 12 15    CREA 0.84 0.90 1.03*   GLU 115* 84 145*   CA 9.0 9.1 9.0     No results for input(s): ALT, AP, TBIL, TBILI, TP, ALB, GLOB, GGT, AML, LPSE in the last 72 hours.    No lab exists for component: SGOT, GPT, AMYP, HLPSE  No results for input(s): INR, PTP, APTT, INREXT, INREXT in the last 72 hours.   No results for input(s): FE, TIBC, PSAT, FERR in the last 72 hours.   No results found for: FOL, RBCF   No results for input(s): PH, PCO2, PO2 in the last 72 hours.  No results for input(s): CPK, CKNDX, TROIQ in the last 72 hours.    No lab exists for component: CPKMB  Lab Results   Component Value Date/Time    Cholesterol, total 219 (H) 10/30/2018 10:27 AM    HDL Cholesterol 63 10/30/2018 10:27 AM    LDL, calculated 130 (H) 10/30/2018 10:27 AM    Triglyceride 131 10/30/2018 10:27 AM  CHOL/HDL Ratio 3.4 12/26/2007 09:12 AM     Lab Results   Component Value Date/Time    Glucose (POC) 196 (H) 12/11/2019 11:32 AM    Glucose (POC) 132 (H) 12/11/2019 06:30 AM    Glucose (POC) 155 (H) 12/10/2019 09:50 PM    Glucose (POC) 94 12/10/2019 04:15 PM    Glucose (POC) 146 (H) 12/10/2019 11:30 AM     No results found for: COLOR, APPRN, SPGRU, REFSG, PHU, PROTU, GLUCU, KETU, BILU, UROU, NITU, LEUKU, GLUKE, EPSU, BACTU, WBCU, RBCU, CASTS, UCRY      Medications Reviewed:     Current Facility-Administered Medications   Medication Dose Route Frequency   ??? amLODIPine (NORVASC) tablet 5 mg  5 mg Oral DAILY   ??? [Held by provider] losartan (COZAAR) tablet 50 mg  50 mg Oral DAILY   ??? lactulose (CHRONULAC) 10 gram/15 mL solution 30 mL  20 g Oral BID   ??? HYDROmorphone (PF) (DILAUDID) injection 0.5 mg  0.5 mg IntraVENous Q4H PRN   ??? alteplase  (CATHFLO) 1 mg in sterile water (preservative free) 1 mL injection  1 mg InterCATHeter PRN   ??? oxyCODONE IR (ROXICODONE) tablet 10 mg  10 mg Oral Q4H PRN   ??? insulin glargine (LANTUS) injection 12 Units  12 Units SubCUTAneous QHS   ??? acetaminophen (TYLENOL) tablet 650 mg  650 mg Oral Q6H   ??? gabapentin (NEURONTIN) capsule 300 mg  300 mg Oral QHS   ??? gabapentin (NEURONTIN) capsule 100 mg  100 mg Oral DAILY   ??? sodium chloride (NS) flush 5-40 mL  5-40 mL IntraVENous Q8H   ??? sodium chloride (NS) flush 5-40 mL  5-40 mL IntraVENous PRN   ??? sodium phosphate (FLEET'S) enema 1 Enema  1 Enema Rectal PRN   ??? oxyCODONE IR (ROXICODONE) tablet 5 mg  5 mg Oral Q4H PRN   ??? docusate sodium (COLACE) capsule 100 mg  100 mg Oral BID   ??? senna (SENOKOT) tablet 8.6 mg  1 Tab Oral DAILY   ??? polyethylene glycol (MIRALAX) packet 17 g  17 g Oral DAILY PRN   ??? bisacodyL (DULCOLAX) suppository 10 mg  10 mg Rectal DAILY PRN   ??? [Held by provider] hydroCHLOROthiazide (HYDRODIURIL) tablet 25 mg  25 mg Oral DAILY   ??? sodium chloride (NS) flush 5-40 mL  5-40 mL IntraVENous Q8H   ??? sodium chloride (NS) flush 5-40 mL  5-40 mL IntraVENous PRN   ??? acetaminophen (TYLENOL) suppository 650 mg  650 mg Rectal Q6H PRN   ??? promethazine (PHENERGAN) tablet 12.5 mg  12.5 mg Oral Q6H PRN    Or   ??? ondansetron (ZOFRAN) injection 4 mg  4 mg IntraVENous Q6H PRN   ??? glucose chewable tablet 16 g  4 Tab Oral PRN   ??? dextrose (D50W) injection syrg 12.5-25 g  25-50 mL IntraVENous PRN   ??? glucagon (GLUCAGEN) injection 1 mg  1 mg IntraMUSCular PRN   ??? insulin lispro (HUMALOG) injection   SubCUTAneous AC&HS   ??? aspirin delayed-release tablet 81 mg  81 mg Oral DAILY   ??? pantoprazole (PROTONIX) tablet 40 mg  40 mg Oral ACB   ??? enoxaparin (LOVENOX) injection 40 mg  40 mg SubCUTAneous Q24H     ______________________________________________________________________  EXPECTED LENGTH OF STAY: 4d 2h  ACTUAL LENGTH OF STAY:          [place],  MD

## 2019-12-12 ENCOUNTER — Inpatient Hospital Stay: Admit: 2019-12-12 | Payer: Medicare (Managed Care) | Primary: Family

## 2019-12-12 LAB — CBC WITH AUTOMATED DIFF
ABS. BASOPHILS: 0.1 10*3/uL (ref 0.0–0.1)
ABS. BASOPHILS: 0.1 10*3/uL (ref 0.0–0.1)
ABS. EOSINOPHILS: 0.8 10*3/uL — ABNORMAL HIGH (ref 0.0–0.4)
ABS. EOSINOPHILS: 0.9 10*3/uL — ABNORMAL HIGH (ref 0.0–0.4)
ABS. IMM. GRANS.: 0.1 10*3/uL — ABNORMAL HIGH (ref 0.00–0.04)
ABS. IMM. GRANS.: 0.2 10*3/uL — ABNORMAL HIGH (ref 0.00–0.04)
ABS. LYMPHOCYTES: 0.9 10*3/uL (ref 0.8–3.5)
ABS. LYMPHOCYTES: 0.8 10*3/uL (ref 0.8–3.5)
ABS. MONOCYTES: 0.5 10*3/uL (ref 0.0–1.0)
ABS. MONOCYTES: 0.3 10*3/uL (ref 0.0–1.0)
ABS. NEUTROPHILS: 4.7 10*3/uL (ref 1.8–8.0)
ABS. NEUTROPHILS: 5.2 10*3/uL (ref 1.8–8.0)
ABSOLUTE NRBC: 0 10*3/uL (ref 0.00–0.01)
ABSOLUTE NRBC: 0 10*3/uL (ref 0.00–0.01)
BASOPHILS: 1 % (ref 0–1)
BASOPHILS: 1 % (ref 0–1)
EOSINOPHILS: 11 % — ABNORMAL HIGH (ref 0–7)
EOSINOPHILS: 12 % — ABNORMAL HIGH (ref 0–7)
HCT: 24.8 % — ABNORMAL LOW (ref 35.0–47.0)
HCT: 22.3 % — ABNORMAL LOW (ref 35.0–47.0)
HGB: 8.3 g/dL — ABNORMAL LOW (ref 11.5–16.0)
HGB: 7.6 g/dL — ABNORMAL LOW (ref 11.5–16.0)
IMMATURE GRANULOCYTES: 1 % — ABNORMAL HIGH (ref 0.0–0.5)
IMMATURE GRANULOCYTES: 2 % — ABNORMAL HIGH (ref 0.0–0.5)
LYMPHOCYTES: 13 % (ref 12–49)
LYMPHOCYTES: 10 % — ABNORMAL LOW (ref 12–49)
MCH: 30.3 PG (ref 26.0–34.0)
MCH: 30.5 PG (ref 26.0–34.0)
MCHC: 33.5 g/dL (ref 30.0–36.5)
MCHC: 34.1 g/dL (ref 30.0–36.5)
MCV: 90.5 FL (ref 80.0–99.0)
MCV: 89.6 FL (ref 80.0–99.0)
MONOCYTES: 7 % (ref 5–13)
MONOCYTES: 4 % — ABNORMAL LOW (ref 5–13)
MPV: 9.9 FL (ref 8.9–12.9)
MPV: 9.7 FL (ref 8.9–12.9)
NEUTROPHILS: 67 % (ref 32–75)
NEUTROPHILS: 71 % (ref 32–75)
NRBC: 0 PER 100 WBC
NRBC: 0 PER 100 WBC
PLATELET: 393 10*3/uL (ref 150–400)
PLATELET: 353 10*3/uL (ref 150–400)
RBC: 2.74 M/uL — ABNORMAL LOW (ref 3.80–5.20)
RBC: 2.49 M/uL — ABNORMAL LOW (ref 3.80–5.20)
RDW: 13.8 % (ref 11.5–14.5)
RDW: 13.7 % (ref 11.5–14.5)
WBC: 7.1 10*3/uL (ref 3.6–11.0)
WBC: 7.5 10*3/uL (ref 3.6–11.0)

## 2019-12-12 LAB — METABOLIC PANEL, BASIC
Anion gap: 6 mmol/L (ref 5–15)
BUN/Creatinine ratio: 13 (ref 12–20)
BUN: 12 MG/DL (ref 6–20)
CO2: 25 mmol/L (ref 21–32)
Calcium: 9.2 MG/DL (ref 8.5–10.1)
Chloride: 103 mmol/L (ref 97–108)
Creatinine: 0.9 MG/DL (ref 0.55–1.02)
GFR est AA: >60 mL/min/{1.73_m2} (ref >60)
GFR est non-AA: >60 mL/min/{1.73_m2} (ref >60)
Glucose: 103 mg/dL — ABNORMAL HIGH (ref 65–100)
Potassium: 3.9 mmol/L (ref 3.5–5.1)
Sodium: 134 mmol/L — ABNORMAL LOW (ref 136–145)

## 2019-12-12 LAB — COVID-19 RAPID TEST: COVID-19 rapid test: NOT DETECTED

## 2019-12-12 LAB — GLUCOSE, POC
Glucose (POC): 161 mg/dL — ABNORMAL HIGH (ref 65–100)
Glucose (POC): 114 mg/dL — ABNORMAL HIGH (ref 65–100)
Glucose (POC): 149 mg/dL — ABNORMAL HIGH (ref 65–100)
Glucose (POC): 156 mg/dL — ABNORMAL HIGH (ref 65–100)

## 2019-12-12 LAB — PROCALCITONIN
Procalcitonin: 0.21 ng/mL
Procalcitonin: 0.21 ng/mL

## 2019-12-12 LAB — CULTURE, BLOOD, PAIRED
Culture result:: NO GROWTH
Culture: NO GROWTH

## 2019-12-12 LAB — LACTIC ACID
Lactic Acid: 0.7 MMOL/L (ref 0.4–2.0)
Lactic acid: 0.7 MMOL/L (ref 0.4–2.0)

## 2019-12-12 LAB — CBC WITH AUTO DIFFERENTIAL
Basophils %: 1 % (ref 0–1)
Basophils %: 1 % (ref 0–1)
Basophils Absolute: 0.1 10*3/uL (ref 0.0–0.1)
Basophils Absolute: 0.1 10*3/uL (ref 0.0–0.1)
Eosinophils %: 11 % — ABNORMAL HIGH (ref 0–7)
Eosinophils %: 12 % — ABNORMAL HIGH (ref 0–7)
Eosinophils Absolute: 0.8 10*3/uL — ABNORMAL HIGH (ref 0.0–0.4)
Eosinophils Absolute: 0.9 10*3/uL — ABNORMAL HIGH (ref 0.0–0.4)
Granulocyte Absolute Count: 0.1 10*3/uL — ABNORMAL HIGH (ref 0.00–0.04)
Granulocyte Absolute Count: 0.2 10*3/uL — ABNORMAL HIGH (ref 0.00–0.04)
Hematocrit: 24.8 % — ABNORMAL LOW (ref 35.0–47.0)
Hematocrit: 22.3 % — ABNORMAL LOW (ref 35.0–47.0)
Hemoglobin: 8.3 g/dL — ABNORMAL LOW (ref 11.5–16.0)
Hemoglobin: 7.6 g/dL — ABNORMAL LOW (ref 11.5–16.0)
Immature Granulocytes %: 1 % — ABNORMAL HIGH (ref 0.0–0.5)
Immature Granulocytes %: 2 % — ABNORMAL HIGH (ref 0.0–0.5)
Lymphocytes %: 13 % (ref 12–49)
Lymphocytes %: 10 % — ABNORMAL LOW (ref 12–49)
Lymphocytes Absolute: 0.9 10*3/uL (ref 0.8–3.5)
Lymphocytes Absolute: 0.8 10*3/uL (ref 0.8–3.5)
MCH: 30.3 PG (ref 26.0–34.0)
MCH: 30.5 PG (ref 26.0–34.0)
MCHC: 33.5 g/dL (ref 30.0–36.5)
MCHC: 34.1 g/dL (ref 30.0–36.5)
MCV: 90.5 FL (ref 80.0–99.0)
MCV: 89.6 FL (ref 80.0–99.0)
MPV: 9.9 FL (ref 8.9–12.9)
MPV: 9.7 FL (ref 8.9–12.9)
Monocytes %: 7 % (ref 5–13)
Monocytes %: 4 % — ABNORMAL LOW (ref 5–13)
Monocytes Absolute: 0.5 10*3/uL (ref 0.0–1.0)
Monocytes Absolute: 0.3 10*3/uL (ref 0.0–1.0)
NRBC Absolute: 0 10*3/uL (ref 0.00–0.01)
NRBC Absolute: 0 10*3/uL (ref 0.00–0.01)
Neutrophils %: 67 % (ref 32–75)
Neutrophils %: 71 % (ref 32–75)
Neutrophils Absolute: 4.7 10*3/uL (ref 1.8–8.0)
Neutrophils Absolute: 5.2 10*3/uL (ref 1.8–8.0)
Nucleated RBCs: 0 PER 100 WBC
Nucleated RBCs: 0 PER 100 WBC
Platelets: 393 10*3/uL (ref 150–400)
Platelets: 353 10*3/uL (ref 150–400)
RBC: 2.74 M/uL — ABNORMAL LOW (ref 3.80–5.20)
RBC: 2.49 M/uL — ABNORMAL LOW (ref 3.80–5.20)
RDW: 13.8 % (ref 11.5–14.5)
RDW: 13.7 % (ref 11.5–14.5)
WBC: 7.1 10*3/uL (ref 3.6–11.0)
WBC: 7.5 10*3/uL (ref 3.6–11.0)

## 2019-12-12 LAB — COVID-19, RAPID: SARS-CoV-2, Rapid: NOT DETECTED

## 2019-12-12 LAB — POCT GLUCOSE
POC Glucose: 161 mg/dL — ABNORMAL HIGH (ref 65–100)
POC Glucose: 114 mg/dL — ABNORMAL HIGH (ref 65–100)
POC Glucose: 149 mg/dL — ABNORMAL HIGH (ref 65–100)
POC Glucose: 156 mg/dL — ABNORMAL HIGH (ref 65–100)

## 2019-12-12 LAB — BASIC METABOLIC PANEL
Anion Gap: 6 mmol/L (ref 5–15)
BUN/Creatinine Ratio: 13 (ref 12–20)
BUN: 12 MG/DL (ref 6–20)
CO2: 25 mmol/L (ref 21–32)
Calcium: 9.2 MG/DL (ref 8.5–10.1)
Chloride: 103 mmol/L (ref 97–108)
Creatinine: 0.9 MG/DL (ref 0.55–1.02)
GFR African American: >60 mL/min/{1.73_m2} (ref >60)
Glucose: 103 mg/dL — ABNORMAL HIGH (ref 65–100)
Potassium: 3.9 mmol/L (ref 3.5–5.1)
Sodium: 134 mmol/L — ABNORMAL LOW (ref 136–145)
eGFR NON-AA: >60 mL/min/{1.73_m2} (ref >60)

## 2019-12-12 MED ORDER — ACETAMINOPHEN 325 MG TABLET
325 mg | ORAL_TABLET | Freq: Four times a day (QID) | ORAL | 0 refills | Status: DC | PRN
Start: 2019-12-12 — End: 2020-11-19

## 2019-12-12 MED ORDER — OXYCODONE 5 MG TAB
5 mg | ORAL_TABLET | Freq: Four times a day (QID) | ORAL | 0 refills | Status: AC | PRN
Start: 2019-12-12 — End: 2019-12-15

## 2019-12-12 MED ORDER — LEVEMIR FLEXTOUCH U-100 INSULIN 100 UNIT/ML (3 ML) SUBCUTANEOUS PEN
100 unit/mL (3 mL) | Freq: Every day | SUBCUTANEOUS | 0 refills | Status: DC
Start: 2019-12-12 — End: 2020-10-30

## 2019-12-12 MED ORDER — ASPIRIN 81 MG TAB, DELAYED RELEASE
81 mg | ORAL_TABLET | Freq: Every day | ORAL | 0 refills | Status: DC
Start: 2019-12-12 — End: 2019-12-18

## 2019-12-12 MED ORDER — DOCUSATE SODIUM 100 MG CAP
100 mg | ORAL_CAPSULE | Freq: Two times a day (BID) | ORAL | 0 refills | Status: AC | PRN
Start: 2019-12-12 — End: 2020-03-11

## 2019-12-12 MED FILL — OXYCODONE 5 MG TAB: 5 mg | ORAL | Qty: 1

## 2019-12-12 MED FILL — AMLODIPINE 5 MG TAB: 5 mg | ORAL | Qty: 1

## 2019-12-12 MED FILL — ASPIRIN 81 MG TAB, DELAYED RELEASE: 81 mg | ORAL | Qty: 1

## 2019-12-12 MED FILL — INSULIN LISPRO 100 UNIT/ML INJECTION: 100 unit/mL | SUBCUTANEOUS | Qty: 1

## 2019-12-12 MED FILL — ACETAMINOPHEN 325 MG TABLET: 325 mg | ORAL | Qty: 2

## 2019-12-12 MED FILL — INSULIN GLARGINE 100 UNIT/ML INJECTION: 100 unit/mL | SUBCUTANEOUS | Qty: 1

## 2019-12-12 MED FILL — DOK 100 MG CAPSULE: 100 mg | ORAL | Qty: 1

## 2019-12-12 MED FILL — SENNA LAX 8.6 MG TABLET: 8.6 mg | ORAL | Qty: 1

## 2019-12-12 MED FILL — GABAPENTIN 100 MG CAP: 100 mg | ORAL | Qty: 1

## 2019-12-12 MED FILL — GABAPENTIN 300 MG CAP: 300 mg | ORAL | Qty: 1

## 2019-12-12 MED FILL — ENOXAPARIN 40 MG/0.4 ML SUB-Q SYRINGE: 40 mg/0.4 mL | SUBCUTANEOUS | Qty: 0.4

## 2019-12-12 MED FILL — PANTOPRAZOLE 40 MG TAB, DELAYED RELEASE: 40 mg | ORAL | Qty: 1

## 2019-12-12 MED FILL — LACTULOSE 20 GRAM/30 ML ORAL SOLUTION: 20 gram/30 mL | ORAL | Qty: 30

## 2019-12-12 NOTE — Progress Notes (Signed)
Patient has slight fever of 100.5. Dr. Fredonia Highland notified via [facility]

## 2019-12-12 NOTE — Progress Notes (Signed)
Bedside and Verbal shift change report given to Lonn [place], Therapist, sports (Soil scientist) by Ria Clock, RN (offgoing nurse). Report included the following information SBAR, ED Summary, Procedure Summary, MAR and Recent Results.

## 2019-12-12 NOTE — Progress Notes (Signed)
Problem: Self Care Deficits Care Plan (Adult)  Goal: *Acute Goals and Plan of Care (Insert Text)  Description: Occupational Therapy Goals  Initiated: 12/12/2019   1.  Patient will perform grooming with supervision/set-up standing for one activity within 7 day(s).  2.  Patient will perform bathing with set-up/supervision from chair within 7 day(s).  3.  Patient will perform upper body dressing and lower body dressing with set-up/supervision within 7 day(s).  4.  Patient will perform toilet transfers with supervision within 7 day(s).  5.  Patient will perform all aspects of toileting with min A within 7 day(s).      FUNCTIONAL STATUS PRIOR TO ADMISSION: Patient was independent and active without use of DME.    HOME SUPPORT: The patient lived alone with no local support.      Outcome: Progressing Towards Goal     Problem: Self Care Deficits Care Plan (Adult)  Goal: *Acute Goals and Plan of Care (Insert Text)  Description: Occupational Therapy Goals  Initiated: 12/12/2019   1.  Patient will perform grooming with supervision/set-up standing for one activity within 7 day(s).  2.  Patient will perform bathing with set-up/supervision from chair within 7 day(s).  3.  Patient will perform upper body dressing and lower body dressing with set-up/supervision within 7 day(s).  4.  Patient will perform toilet transfers with supervision within 7 day(s).  5.  Patient will perform all aspects of toileting with min A within 7 day(s).      FUNCTIONAL STATUS PRIOR TO ADMISSION: Patient was independent and active without use of DME.    HOME SUPPORT: The patient lived alone with no local support.      Outcome: Progressing Towards Goal    OCCUPATIONAL THERAPY EVALUATION  Patient: Lindsey Huynh [id number] y.o. female)  Date: 12/12/2019  Primary Diagnosis: Gangrene ([place]) [I96]  Procedure(s) (LRB):  LEFT TRANSMETATARSAL AMPUTATION WITH TENDON BALANCING (Left) 7 Days Post-Op   Precautions:   PWB(5' PWB but no long distances)    ASSESSMENT  Based  on the objective data described below, the patient presents with s/p TMA and femoral bypass on LLE.  Per MD notes, pt may PWB through heel for short distances less than 5'.  Information found in podiatrist notes for post-operative care.  Pt progressed OOB to [facility] with min A.  She requires cues for safety with all  mobility and for hand placement.  She fatigues quickly with simple activities.  She is currently unable to meet her basic self care needs and more specifically unable to complete IADL activities.  She will benefit from continued OT intervention for basic ADL training, safety, energy conservation, home modifications due to WB restrictions, and IADL training.  Recommend discharge to rehab setting as she is unable to meet her basic needs.    Current Level of Function Impacting Discharge (ADLs/self-care): min A    Functional Outcome Measure:  The patient scored 50 on the Barthel Index outcome measure which is indicative of 50%.      Other factors to consider for discharge: debility, PWB for short distance only     Patient will benefit from skilled therapy intervention to address the above noted impairments.       PLAN :  Recommendations and Planned Interventions: self care training, functional mobility training, therapeutic exercise, balance training, therapeutic activities, endurance activities, patient education, home safety training, and family training/education    Frequency/Duration: Patient will be followed by occupational therapy 5 times a week to address goals.  Recommendation for discharge: (in order for the patient to meet his/her long term goals)  Therapy up to 5 days/week in SNF setting    This discharge recommendation:  Has been made in collaboration with the attending provider and/or case management    IF patient discharges home will need the following DME: none       SUBJECTIVE:   Patient stated ???I am feeling alright but I am tired.???    OBJECTIVE DATA SUMMARY:   HISTORY:   Past Medical History:    Diagnosis Date    Diabetes ([place])     insulin + metformin    GERD (gastroesophageal reflux disease)     Hypercholesterolemia     Hypertension      Past Surgical History:   Procedure Laterality Date    HX COLONOSCOPY      HX ORTHOPAEDIC  06-30-10    back surgery (tumor removed)    HX TUMOR REMOVAL  06/30/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       Expanded or extensive additional review of patient history:     Home Situation  Home Environment: Private residence  # Steps to Enter: 4  Rails to Enter: Yes  Kelly Services : Right  Wheelchair Ramp: No  One/Two Story Residence: One story  Living Alone: Yes  Support Systems: Family member(s)  Patient Expects to be Discharged to:: Rehabilitation facility  Current DME Used/Available at Home: Cane, straight  Tub or Shower Type: Tub/Shower combination    Hand dominance: Right    EXAMINATION OF PERFORMANCE DEFICITS:  Cognitive/Behavioral Status:  Neurologic State: Alert  Orientation Level: Oriented X4  Cognition: Appropriate for age attention/concentration  Perception: Appears intact  Perseveration: No perseveration noted  Safety/Judgement: Good awareness of safety precautions    Skin: see nursing notes    Edema:  none noted    Hearing:  Auditory  Auditory Impairment: None    Vision/Perceptual:                           Acuity: Within Defined Limits         Range of Motion:    AROM: Within functional limits  PROM: Within functional limits                      Strength:    Strength: Within functional limits                Coordination:  Coordination: Within functional limits  Fine Motor Skills-Upper: Right Intact;Left Intact    Gross Motor Skills-Upper: Right Intact;Left Intact    Tone & Sensation:    Tone: Normal  Sensation: Intact                      Balance:  Sitting: Intact  Sitting - Static: Good (unsupported)  Sitting - Dynamic: Good (unsupported)  Standing: Impaired;With support  Standing - Static: Good;Constant support  Standing - Dynamic : Poor;Constant support(LOB with turning to [facility])     Functional Mobility and Transfers for ADLs:  Bed Mobility:  Supine to Sit: Additional time;Supervision  Sit to Supine: Additional time;Contact guard assistance    Transfers:  Sit to Stand: Minimum assistance  Stand to Sit: Minimum assistance  Bed to Chair: Independent  Bathroom Mobility: Minimum assistance  Toilet Transfer : Minimum assistance(BSC only due to limited distance walking)    ADL Assessment:  Feeding: Supervision    Oral Facial Hygiene/Grooming: Setup  Bathing: Minimum assistance(simulated ADL activities)    Upper Body Dressing: Supervision    Lower Body Dressing: Moderate assistance    Toileting: Total assistance(BSC only due to distanc restrictions per post-op recovery)                ADL Intervention and task modifications:     Pt was able to progress OOB to [facility] and donning post-op shoe.  She needed mod A to adjust shoe over her foot.  She then progressed to [facility] with min A using RW.  She did well with transfer but has difficulty with hand placement and needs reinforcement for safety.  She soiled brief due to failure of purewick.  She returned to supine in bed following intervention and provided with pillows to her liking.                                 Cognitive Retraining  Safety/Judgement: Good awareness of safety precautions    Functional Measure:  Barthel Index:    Bathing: 0  Bladder: 10  Bowels: 10  Grooming: 5  Dressing: 5  Feeding: 10  Mobility: 0  Stairs: 0  Toilet Use: 0  Transfer (Bed to Chair and Back): 10  Total: 50/100        The Barthel ADL Index: Guidelines  1. The index should be used as a record of what a patient does, not as a record of what a patient could do.  2. The main aim is to establish degree of independence from any help, physical or verbal, however minor and for whatever reason.  3. The need for supervision renders the patient not independent.  4. A patient's performance should be established using the best available evidence. Asking the patient, friends/relatives  and nurses are the usual sources, but direct observation and common sense are also important. However direct testing is not needed.  5. Usually the patient's performance over the preceding 24-48 hours is important, but occasionally longer periods will be relevant.  6. Middle categories imply that the patient supplies over 50 per cent of the effort.  7. Use of aids to be independent is allowed.    Daneen Schick., Barthel, D.W. [id number]). Functional evaluation: the Barthel Index. [place] (14)2.  Lucianne Lei der [place], J.J.M.F, [place], Diona Browner., Oris Drone., [place], [place] (1999). Measuring the change indisability after inpatient rehabilitation; comparison of the responsiveness of the Barthel Index and Functional Independence Measure. Journal of Neurology, Neurosurgery, and Psychiatry, 66(4), [id number].  Wilford Sports, N.J.A, [place],  W.J.M, & [place], M.A. (2004.) Assessment of post-stroke quality of life in cost-effectiveness studies: The usefulness of the Barthel Index and the EuroQoL-5D. Quality of Life Research, 69, 427-43         Occupational Therapy Evaluation Charge Determination   History Examination Decision-Making   LOW Complexity : Brief history review  HIGH Complexity : 5 or more performance deficits relating to physical, cognitive , or psychosocial skils that result in activity limitations and / or participation restrictions HIGH Complexity : Patient presents with comorbidities that affect occupational performance. Signifigant modification of tasks or assistance (eg, physical or verbal) with assessment (s) is necessary to enable patient to complete evaluation       Based on the above components, the patient evaluation is determined to be of the following complexity level: LOW   Pain Rating:  Minimal pain in L foot    Activity Tolerance:  Fair    After treatment patient left in no apparent distress:    Supine in bed, Heels elevated for pressure relief, and Call bell within reach     COMMUNICATION/EDUCATION:   The patient???s plan of care was discussed with: Physical therapist, Registered nurse, and Case management.     Home safety education was provided and the patient/caregiver indicated understanding. and Patient/family have participated as able in goal setting and plan of care.    This patient???s plan of care is appropriate for delegation to OTA.    Thank you for this referral.  Ocie Cornfield, OT

## 2019-12-12 NOTE — Progress Notes (Addendum)
TOC: Authorization cleared for discharge. Patient plans to discharge to [facility] today at 4pm with BLS/[facility] to transport. MD and RN notified. RN to call report to [phone]. Patient going to room 101. CM following for discharge needs.     Medicare pt has received, reviewed, and signed 2nd IM letter informing them of their right to appeal the discharge.  Signed copy has been placed on pt bedside chart.    Update 1539: Patient running fever and discharge cancelled per MD. Called [facility] and cancelled and called [facility] and spoke to [place] and cancelled admission for today.     RUR: 17%    Nathanial Millman RN/CRM

## 2019-12-12 NOTE — Telephone Encounter (Signed)
Spoke with Duwayne Heck from [facility] in regards to incomplete dmv form. She states that the date of exam was missing from some of the pages but she was able to fix it over the phone. No further actions required at this time.

## 2019-12-12 NOTE — Progress Notes (Signed)
Patient BP 122/76 HR 88. Ok per Dr. Fredonia Highland to give 5 mg of Amlodipine.

## 2019-12-12 NOTE — Progress Notes (Addendum)
Vascular:    No new issues. Will leave remaining staples for now.

## 2019-12-12 NOTE — Discharge Summary (Signed)
Discharge Summary by Cheryll Cockayne, MD at 12/12/19 1329                Author: Cheryll Cockayne, MD  Service: Hospitalist  Author Type: Physician       Filed: 12/12/19 1352  Date of Service: 12/12/19 1329  Status: Signed          Editor: Cheryll Cockayne, MD (Physician)                                                                                                        Discharge Instructions/Summary        Patient: Lindsey Huynh        MRN: [id number]        Date of birth: October 27, 1950        Age: 69 y.o.       Date of admission:  11/23/2019      Date of discharge:  12/12/2019      Primary care provider:  Karsten Fells, NP        Admitting provider:  Donato Schultz, MD      Discharging provider:  Cheryll Cockayne, MD , to contact this individual call [phone]  and ask the operator to page, if unavailable ask for the triage hospitalist to be paged.      Consultations   ??  IP CONSULT TO VASCULAR SURGERY   ??  IP CONSULT TO PODIATRY   ??  IP CONSULT TO INFECTIOUS DISEASES   ??  IP CONSULT TO CARDIOLOGY      Procedures/Surgeries   Procedure(s):   LEFT TRANSMETATARSAL AMPUTATION WITH TENDON BALANCING      Discharge destination: SNF- [place].  The patient is stable for discharge.      Admission diagnosis   ??  Gangrene ([place]) [place] COURSE:   Per HPI: Shevon Wisnieski??is a 69 y.o.??female??with past medical history of diabetes, insulin-dependent, GERD and hypertension comes for return of left foot pain and  swelling.   ??   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 First distal phalanx osteomyelitis.   - completed cefepime. Appreciate ID input.    - surgical margin clear   - Vascular surgery: follow up in 2 weeks post- op. Staples removed today    - appreciate Podiatry input    - started on aspirin    - pain meds    ??    Anemia    -  No signs of active bleeding noted. hgb low stable    - Monitor labs and transfuse if hgb <7.0??   ??   DMII:??hold metformin while IP   -Cont basal insulin, SSI   -Diabetic diet   -a1c 8.1   - DM following??   ??   HTN   -Continue amlodipine. discontinuing Cozaar and HCTZ due to soft BP    ??  GERD   -Cont PPI           Current Discharge Medication List              START taking these medications          Details        acetaminophen (TYLENOL) 325 mg tablet  Take 2 Tabs by mouth every six (6) hours as needed for Pain.   Qty: 10 Tab, Refills:  0               docusate sodium (COLACE) 100 mg capsule  Take 1 Cap by mouth two (2) times daily as needed for Constipation for up to 90 days.   Qty: 60 Cap, Refills:  0               oxyCODONE IR (ROXICODONE) 5 mg immediate release tablet  Take 1 Tab by mouth every six (6) hours as needed for Pain for up to 3 days. Max Daily Amount: 20 mg.   Qty: 10 Tab, Refills:  0          Associated Diagnoses: Gangrene ([place])               !! aspirin delayed-release 81 mg tablet  Take 1 Tab by mouth daily.   Qty: 30 Tab, Refills:  0               !! - Potential duplicate medications found. Please discuss with provider.              CONTINUE these medications which have CHANGED          Details        insulin detemir U-100 (Levemir FlexTouch U-100 Insuln) 100 unit/mL (3 mL) inpn  12 Units by SubCUTAneous route daily (with breakfast).   Qty: 5 Adjustable Dose Pre-filled Pen Syringe, Refills:  0          Associated Diagnoses: Type 2 diabetes with nephropathy ([place])                     CONTINUE these medications which have NOT CHANGED          Details        metFORMIN ER (GLUCOPHAGE XR) 500 mg tablet  TAKE 2 TABLETS BY MOUTH DAILY WITH DINNER   Qty: 60 Tab, Refills:  0          Associated Diagnoses: Controlled type 2 diabetes mellitus without complication, without long-term  current use of insulin ([place]); Type 2 diabetes mellitus without complication, unspecified whether long term insulin  use (HCC)               amLODIPine (NORVASC) 10 mg tablet  Take 1 Tab by mouth daily.   Qty: 30 Tab, Refills:  0          Associated Diagnoses: Essential hypertension               gabapentin (NEURONTIN) 300 mg capsule  Take 1 Cap by mouth nightly. Max Daily Amount: 300 mg.   Qty: 30 Cap, Refills:  0          Associated Diagnoses: Diabetic polyneuropathy associated with type 2 diabetes mellitus (HCC)               Insulin Needles, Disposable, (Nano Pen Needle) 32 gauge x 5/32" ndle  Use with insulin pen 3 times daily   Qty: 100 Pen Needle, Refills:  0  cyclopentolate (CYCLOGYL) 1 % ophthalmic solution                 !! aspirin delayed-release 81 mg tablet  Take 81 mg by mouth daily.               omeprazole (PRILOSEC) 20 mg capsule  Take 20 mg by mouth daily.               !! - Potential duplicate medications found. Please discuss with provider.              STOP taking these medications                  valsartan-hydroCHLOROthiazide (DIOVAN-HCT) 160-25 mg per tablet  Comments:    Reason for Stopping:                                FOLLOW-UP CARE RECOMMENDATIONS/TESTING/NURSING ORDERS:   ??  PT/OT   ??  Cbc on 3 days    ??  Bowel regimen   ??  Monitor BP          DIET:   Cardiac Diet and Diabetic Diet      ACTIVITY:  Activity as  tolerated, PT/OT to evaluate and treat and OOB for meals      APPOINTMENTS:   ??  Follow-up with primary care provider, Dr. Ermalinda Barrios, Gae Gallop, NP   -  Please call to set up an appointment to  be seen in  1 week    ??  Podiatry in 1 week   ??  Vascular surgery in 2 weeks   ??  Cardiology in 1 -2 weeks          PENDING TEST RESULTS:   At the time of discharge the following test results are still pending: none .    Please review these results as they become available.      Specific symptoms to watch for: chest pain, shortness of breath, f ever, chills, nausea, vomiting, diarrhea, change in mentation, falling, weakness, bleeding .      GOALS OF CARE:      X   Eventual return to  home/independent/assisted living           Long term SNF            Hospice           No rehospitalization        SOCIAL HISTORY:        Social History          Socioeconomic History         ?  Marital status:  SINGLE              Spouse name:  Not on file         ?  Number of children:  Not on file     ?  Years of education:  Not on file     ?  Highest education level:  Not on file       Occupational History        ?  Not on file       Social Needs         ?  Financial resource strain:  Not on file        ?  Food insecurity              Worry:  Not  on file         Inability:  Not on file        ?  Transportation needs              Medical:  Not on file         Non-medical:  Not on file       Tobacco Use         ?  Smoking status:  Former Smoker              Years:  10.00         ?  Smokeless tobacco:  Never Used       Substance and Sexual Activity         ?  Alcohol use:  Yes              Alcohol/week:  5.0 standard drinks         Types:  6 Cans of beer per week         ?  Drug use:  No     ?  Sexual activity:  Not Currently       Lifestyle        ?  Physical activity              Days per week:  Not on file         Minutes per session:  Not on file         ?  Stress:  Not on file       Relationships        ?  Social Health visitor on phone:  Not on file         Gets together:  Not on file         Attends religious service:  Not on file         Active member of club or organization:  Not on file         Attends meetings of clubs or organizations:  Not on file         Relationship status:  Not on file        ?  Intimate partner violence              Fear of current or ex partner:  Not on file         Emotionally abused:  Not on file         Physically abused:  Not on file         Forced sexual activity:  Not on file        Other Topics  Concern        ?  Not on file       Social History Narrative        ?  Not on file           Patient condition at discharge:    Functional status         Poor      X    Deconditioned         Independent     Cognition         Lucid           Forgetful (some sensescence)           Dementia     Catheters/lines (plus indication)  Foley           PICC            PEG                 Code status      X   Full code            DNR             CHRONIC MEDICAL CONDITIONS:      Problem List  as of 12/12/2019  Date Reviewed:  2018/06/28                        Codes  Class  Noted - Resolved             Gangrene ([place])  ICD-10-CM: [id number]   ICD-9-CM: 785.4    11/23/2019 - Present                       Type 2 diabetes mellitus with diabetic neuropathy ([place])  ICD-10-CM: E11.40   ICD-9-CM: 250.60, 357.2    10/30/2018 - Present                       Type 2 diabetes with nephropathy ([place])  ICD-10-CM: E11.21   ICD-9-CM: 250.40, 583.81    02/23/2017 - Present                       Glaucoma of left eye  ICD-10-CM: H40.9   ICD-9-CM: 365.9    06/03/2016 - Present                       Cataract of left eye  ICD-10-CM: H26.9   ICD-9-CM: 366.9    06/03/2016 - Present                       Postmenopausal  ICD-10-CM: Z78.0   ICD-9-CM: V49.81    02/18/2014 - Present                       Constipation  ICD-10-CM: K59.00   ICD-9-CM: 564.00    01/03/2013 - Present                       HTN (hypertension)  ICD-10-CM: I10   ICD-9-CM: 401.9    12/11/2012 - Present                       GERD (gastroesophageal reflux disease)  ICD-10-CM: K21.9   ICD-9-CM: 530.81    12/11/2012 - Present                       Hypercholesterolemia  ICD-10-CM: E78.00   ICD-9-CM: 272.0    12/11/2012 - Present                       Diabetes ([place])  ICD-10-CM: E11.9   ICD-9-CM: 250.00    12/11/2012 - Present                                Physical examination at discharge   Visit Vitals      BP  122/76 (BP 1 Location: Right upper arm, BP Patient Position: At rest)     Pulse  88     Temp  98.6 ??F (37 ??C)     Resp  19     Ht  5\' 5"  (1.651 m)     Wt  76.7 kg (169 lb)     SpO2  99%        BMI  28.12 kg/m??           Constitutional:   No acute distress,  cooperative     ENT:   Oral mucosa moist, oropharynx benign.      Resp:   CTA bilaterally.     CV:   Regular rhythm, normal rate,       GI:   Soft, non distended, non tender.bs+      Musculoskeletal:   surgical site intact       Neurologic:   Moves all extremities.  AAOx3, CN II-XII reviewed, follows commands                           Psych:  Good insight, Not anxious nor agitated.    ??         Significant Diagnostic Studies:    11/23/2019: BUN 17 MG/DL (Ref range: 6 - 20 MG/DL); Calcium 10.0 MG/DL (Ref range: 8.5 - 10.1 MG/DL); CO2 30 mmol/L (Ref range: 21 - 32 mmol/L); Creatinine 0.74 MG/DL  (Ref range: 0.55 - 1.02 MG/DL); Glucose PLEASE DISREGARD RESULTS mg/dL (Ref range: 50 - 100 mg/dL); HCT 34.3 %* (Ref range: 35.0 - 47.0 %); HGB 11.7 g/dL (Ref range: 11.5 - 16.0 g/dL); Potassium 3.2 mmol/L* (Ref range: 3.5 - 5.1 mmol/L); Sodium 138 mmol/L  (Ref range: 136 - 145 mmol/L)   11/24/2019: BUN 11 MG/DL (Ref range: 6 - 20 MG/DL); Calcium 9.8 MG/DL (Ref range: 8.5 - 10.1 MG/DL); CO2 27 mmol/L (Ref range: 21 - 32 mmol/L); Creatinine 0.69 MG/DL (Ref range: 0.55 - 1.02 MG/DL); Glucose 95 mg/dL (Ref range: 65 - 100 mg/dL); HCT 33.2  %* (Ref range: 35.0 - 47.0 %); HGB 11.6 g/dL (Ref range: 11.5 - 16.0 g/dL); Potassium 3.1 mmol/L* (Ref range: 3.5 - 5.1 mmol/L); Sodium 137 mmol/L (Ref range: 136 - 145 mmol/L)     Recent Labs            12/12/19   0152  12/11/19   0530     WBC  7.1  7.6     HGB  8.3*  7.7*     HCT  24.8*  23.0*         PLT  393  346          Recent Labs             12/12/19   0152  12/11/19   0530  12/10/19   0532     NA  134*  134*  137     K  3.9  4.3  3.9     CL  103  105  108     CO2  25  22  24      BUN  12  11  12      CREA  0.90  0.84  0.90     GLU  103*  115*  84          CA  9.2  9.0  9.1        No results for input(s): AP, TBIL, TP, ALB, GLOB, GGT, AML, LPSE in the last 72 hours.      No  lab exists for component: SGOT, GPT, AMYP, HLPSE   No results for input(s): INR, PTP, APTT, INREXT in the last 72 hours.     No results for input(s): FE, TIBC, PSAT, FERR in the last 72 hours.    No results for input(s): PH, PCO2, PO2 in the last 72 hours.   No results for input(s): CPK, CKMB in the last 72 hours.      No lab exists for component: TROPONINI   No components found for: [facility]      Pertinent imaging studies:      Per EMR          Time spent on discharge related activities today greater than 30 minutes.         Signed:  Cheryll Cockayne, MD                  Hospitalist                  12/12/2019                  1:29 PM            Cc: Karsten Fells, NP

## 2019-12-12 NOTE — Progress Notes (Signed)
Patient has slight fever of 100.5. Dr. Fredonia Highland notified via [facility]

## 2019-12-12 NOTE — Progress Notes (Signed)
 Problem: Self Care Deficits Care Plan (Adult)  Goal: *Acute Goals and Plan of Care (Insert Text)  Description: Occupational Therapy Goals  Initiated: 12/12/2019   1.  Patient will perform grooming with supervision/set-up standing for one activity within 7 day(s).  2.  Patient will perform bathing with set-up/supervision from chair within 7 day(s).  3.  Patient will perform upper body dressing and lower body dressing with set-up/supervision within 7 day(s).  4.  Patient will perform toilet transfers with supervision within 7 day(s).  5.  Patient will perform all aspects of toileting with min A within 7 day(s).      FUNCTIONAL STATUS PRIOR TO ADMISSION: Patient was independent and active without use of DME.    HOME SUPPORT: The patient lived alone with no local support.      Outcome: Progressing Towards Goal     Problem: Self Care Deficits Care Plan (Adult)  Goal: *Acute Goals and Plan of Care (Insert Text)  Description: Occupational Therapy Goals  Initiated: 12/12/2019   1.  Patient will perform grooming with supervision/set-up standing for one activity within 7 day(s).  2.  Patient will perform bathing with set-up/supervision from chair within 7 day(s).  3.  Patient will perform upper body dressing and lower body dressing with set-up/supervision within 7 day(s).  4.  Patient will perform toilet transfers with supervision within 7 day(s).  5.  Patient will perform all aspects of toileting with min A within 7 day(s).      FUNCTIONAL STATUS PRIOR TO ADMISSION: Patient was independent and active without use of DME.    HOME SUPPORT: The patient lived alone with no local support.      Outcome: Progressing Towards Goal    OCCUPATIONAL THERAPY EVALUATION  Patient: Makinzee Durley (69 y.o. 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  Per MD notes, pt may PWB through heel for short distances less than 5'.  Information found in podiatrist notes for post-operative care.  Pt progressed OOB to [facility] with min A.  She requires cues for safety with all  mobility and for hand placement.  She fatigues quickly with simple activities.  She is currently unable to meet her basic self care needs and more specifically unable to complete IADL activities.  She will benefit from continued OT intervention for basic ADL training, safety, energy conservation, home modifications due to WB restrictions, and IADL training.  Recommend discharge to rehab setting as she is unable to meet her basic needs.    Current Level of Function Impacting Discharge (ADLs/self-care): min A    Functional Outcome Measure:  The patient scored 50 on the Barthel Index outcome measure which is indicative of 50%.      Other factors to consider for discharge: debility, PWB for short distance only     Patient will benefit from skilled therapy intervention to address the above noted impairments.       PLAN :  Recommendations and Planned Interventions: self care training, functional mobility training, therapeutic exercise, balance training, therapeutic activities, endurance activities, patient education, home safety training, and family training/education    Frequency/Duration: Patient will be followed by occupational therapy 5 times a week to address goals.  Recommendation for discharge: (in order for the patient to meet his/her long term goals)  Therapy up to 5 days/week in SNF setting    This discharge recommendation:  Has been made in collaboration with the attending provider and/or case management    IF patient discharges home will need the following DME: none       SUBJECTIVE:   Patient stated "I am feeling alright but I am tired."    OBJECTIVE DATA SUMMARY:   HISTORY:   Past Medical History:    Diagnosis Date    Diabetes (HCC)     insulin  + metformin     GERD (gastroesophageal reflux disease)     Hypercholesterolemia     Hypertension      Past Surgical History:   Procedure Laterality Date    HX COLONOSCOPY      HX ORTHOPAEDIC  06-30-10    back surgery (tumor removed)    HX TUMOR 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/30/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       Expanded or extensive additional review of patient history:     Home Situation  Home Environment: Private residence  # Steps to Enter: 4  Rails to Enter: Yes  [employer] : Right  Wheelchair Ramp: No  One/Two Story Residence: One story  Living Alone: Yes  Support Systems: Family member(s)  Patient Expects to be Discharged to:: Rehabilitation facility  Current DME Used/Available at Home: Cane, straight  Tub or Shower Type: Tub/Shower combination    Hand dominance: Right    EXAMINATION OF PERFORMANCE DEFICITS:  Cognitive/Behavioral Status:  Neurologic State: Alert  Orientation Level: Oriented X4  Cognition: Appropriate for age attention/concentration  Perception: Appears intact  Perseveration: No perseveration noted  Safety/Judgement: Good awareness of safety precautions    Skin: see nursing notes    Edema:  none noted    Hearing:  Auditory  Auditory Impairment: None    Vision/Perceptual:                           Acuity: Within Defined Limits         Range of Motion:    AROM: Within functional limits  PROM: Within functional limits                      Strength:    Strength: Within functional limits                Coordination:  Coordination: Within functional limits  Fine Motor Skills-Upper: Right Intact;Left Intact    Gross Motor Skills-Upper: Right Intact;Left Intact    Tone & Sensation:    Tone: Normal  Sensation: Intact                      Balance:  Sitting: Intact  Sitting - Static: Good (unsupported)  Sitting - Dynamic: Good (unsupported)  Standing: Impaired;With support  Standing - Static: Good;Constant support  Standing - Dynamic : Poor;Constant support(LOB with turning to  [facility])    Functional Mobility and Transfers for ADLs:  Bed Mobility:  Supine to Sit: Additional time;Supervision  Sit to Supine: Additional time;Contact guard assistance    Transfers:  Sit to Stand: Minimum assistance  Stand to Sit: Minimum assistance  Bed to Chair: Independent  Bathroom Mobility: Minimum assistance  Toilet Transfer : Minimum assistance(BSC only due to limited distance walking)    ADL Assessment:  Feeding: Supervision    Oral Facial Hygiene/Grooming: Setup  Bathing: Minimum assistance(simulated ADL activities)    Upper Body Dressing: Supervision    Lower Body Dressing: Moderate assistance    Toileting: Total assistance(BSC only due to distanc restrictions per post-op recovery)                ADL Intervention and task modifications:     Pt was able to progress OOB to [facility] and donning post-op shoe.  She needed mod A to adjust shoe over her foot.  She then progressed to [facility] with min A using RW.  She did well with transfer but has difficulty with hand placement and needs reinforcement for safety.  She soiled brief due to failure of purewick.  She returned to supine in bed following intervention and provided with pillows to her liking.                                 Cognitive Retraining  Safety/Judgement: Good awareness of safety precautions    Functional Measure:  Barthel Index:    Bathing: 0  Bladder: 10  Bowels: 10  Grooming: 5  Dressing: 5  Feeding: 10  Mobility: 0  Stairs: 0  Toilet Use: 0  Transfer (Bed to Chair and Back): 10  Total: 50/100        The Barthel ADL Index: Guidelines  1. The index should be used as a record of what a patient does, not as a record of what a patient could do.  2. The main aim is to establish degree of independence from any help, physical or verbal, however minor and for whatever reason.  3. The need for supervision renders the patient not independent.  4. A patient's performance should be established using the best available evidence. Asking the patient,  friends/relatives and nurses are the usual sources, but direct observation and common sense are also important. However direct testing is not needed.  5. Usually the patient's performance over the preceding 24-48 hours is important, but occasionally longer periods will be relevant.  6. Middle categories imply that the patient supplies over 50 per cent of the effort.  7. Use of aids to be independent is allowed.    Henriette Desanctis., Barthel, D.W. [phone]). Functional evaluation: the Barthel Index. Md State Med J (14)2.  Fleeta der [place], J.J.M.F, [place], DAIVD., [place], [place]. (1999). Measuring the change indisability after inpatient rehabilitation; comparison of the responsiveness of the Barthel Index and Functional Independence Measure. Journal of Neurology, Neurosurgery, and Psychiatry, 66(4), [phone].  Fleeta [place], N.J.A, [place],  W.J.M, & [place], M.A. (2004.) Assessment of post-stroke quality of life in cost-effectiveness studies: The usefulness of the Barthel Index and the EuroQoL-5D. Quality of Life Research, 40, 572-56         Occupational Therapy Evaluation Charge Determination   History Examination Decision-Making   LOW Complexity : Brief history review  HIGH Complexity : 5 or more performance deficits relating to physical, cognitive , or psychosocial skils that result in activity limitations and / or participation restrictions HIGH Complexity : Patient presents with comorbidities that affect occupational performance. Signifigant modification of tasks or assistance (eg, physical or verbal) with assessment (s) is necessary to enable patient to complete evaluation       Based on the above components, the patient evaluation is determined to be of the following complexity level: LOW   Pain Rating:  Minimal pain in L foot    Activity Tolerance:  Fair    After treatment patient left in no apparent distress:    Supine in bed, Heels elevated for pressure relief, and Call bell within  reach    COMMUNICATION/EDUCATION:   The patient's plan of care was discussed with: Physical therapist, Registered nurse, and Case management.     Home safety education was provided and the patient/caregiver indicated understanding. and Patient/family have participated as able in goal setting and plan of care.    This patient's plan of care is appropriate for delegation to OTA.    Thank you for this referral.  Elvie CHRISTELLA Piano, OT

## 2019-12-12 NOTE — Progress Notes (Signed)
Progress  Notes by Cheryll Cockayne, MD at 12/12/19 1626                Author: Cheryll Cockayne, MD  Service: Hospitalist  Author Type: Physician       Filed: 12/12/19 1629  Date of Service: 12/12/19 1626  Status: Signed          Editor: Cheryll Cockayne, MD (Physician)                    [facility]                                                                                              Hospitalist Progress Note   Cheryll Cockayne, MD   Answering service: [phone] OR [phone] from in house phone            Date of Service:  12/12/2019   NAME:  Lindsey Huynh   DOB:  [birth year]   MRN:  [id number]           Admission Summary:           "Lindsey Huynh??is a 69 y.o.??female??with past medical history of diabetes, insulin-dependent, GERD and hypertension comes for return of left foot pain and swelling.      Interval history / Subjective:          F/U for left foot cellulitis, left great toe gangrene   Patient is seen and examined at bedside this AM. No overnight events or new complaints. Waiting for placement.    Discharge plan held in the afternoon as pt spiked fever 101 - no other symptoms of infection   Septic work up ordered - cbc, lactic, blood cx, rapid covid, UA and CXR           Assessment & Plan:          Left great toe Osteomyelitis s/p TMA    PAD: Status post femoropopliteal bypass on November 28, 2019   LLE pain:??dry gangrene??of??left 2nd toe cellulitic changes/Left popliteal occlusion   ??- MRI LLE:??1. First distal phalanx osteomyelitis.   - completed cefepime. Appreciate ID input.    - surgical margin clear   - Vascular surgery following- Patient to follow up in 2 weeks post- op   - appreciate Podiatry input    - started on aspirin    ??   Anemia    -  No signs of bleeding noted. hgb low stable    - Monitor labs and transfuse if hgb <7.0??   ??   DMII:??hold metformin while IP   -Cont basal insulin, SSI   -Diabetic diet   -a1c 8.1   - DM following??   ??   HTN   -Continue  amlodipine. holding Cozaar and HCTZ due to soft BP    ??   GERD   -Cont PPI      Code status: Full    DVT prophylaxis: Lovenox       Care Plan discussed with: Patient/Family, Nurse and Case Manager   Anticipated Disposition: TBD   [facility]ticipated Discharge: [facility]  Hospital Problems   Date Reviewed:  05/31/2018                         Codes  Class  Noted  POA              [facility])  ICD-10-CM: [id number]   ICD-9-CM: 785.4    11/23/2019  Unknown                               Review of Systems:     A comprehensive review of systems was negative except for that written in the HPI.       Xr Foot Lt Ap/lat      Result Date: 12/05/2019   1. Transmetatarsal amputation.      Xr Foot Lt Min 3 V      Result Date: 11/23/2019   No acute abnormality.      Mri Low Ext Lt Wo Cont      Result Date: 12/01/2019   1. First distal phalanx osteomyelitis. 2. No abscess.       Ct Low Ext Lt W Cont      Result Date: 11/24/2019   No CT imaging evidence for soft tissue gas, abscess or osteomyelitis.      Xr Chest Port      Result Date: 12/12/2019   No evidence of acute cardiopulmonary process.       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 Fluoro Dose:  144.09 mGy. vk        Vital Signs:      Last 24hrs VS reviewed since prior progress note. Most recent are:   Visit Vitals      BP  (!) 146/72 (BP 1 Location: Left upper arm, BP Patient Position: At rest)     Pulse  97     Temp  (!) 100.5 ??F (38.1 ??C)     Resp  14     Ht  5\' 5"  (1.651 m)     Wt  76.7 kg (169 lb)     SpO2  97%        BMI  28.12 kg/m??           No intake or output data in the 24 hours ending 12/12/19 1626         Physical Examination:                     Constitutional:   No acute distress, cooperative     ENT:   Oral mucosa moist, oropharynx benign.      Resp:   CTA bilaterally.     CV:   Regular rhythm, normal rate,          GI:   Soft, non distended, non tender.bs+         Musculoskeletal:   LLE dressing c/d  staples intact          Neurologic:   Moves all extremities.  AAOx3, CN II-XII reviewed, follows commands       Psych:  Good insight, Not anxious nor agitated.         ??                       Data Review:      Review and/or order of clinical lab test  Review and/or order of tests in the medicine section of CPT           Labs:          Recent Labs            12/12/19   0152  12/11/19   0530     WBC  7.1  7.6     HGB  8.3*  7.7*     HCT  24.8*  23.0*         PLT  393  346          Recent Labs             12/12/19   0152  12/11/19   0530  12/10/19   0532     NA  134*  134*  137     K  3.9  4.3  3.9     CL  103  105  108     CO2  25  22  24      BUN  12  11  12      CREA  0.90  0.84  0.90     GLU  103*  115*  84          CA  9.2  9.0  9.1        No results for input(s): ALT, AP, TBIL, TBILI, TP, ALB, GLOB, GGT, AML, LPSE in the last 72 hours.      No lab exists for component: SGOT, GPT, AMYP, HLPSE   No results for input(s): INR, PTP, APTT, INREXT, INREXT in the last 72 hours.    No results for input(s): FE, TIBC, PSAT, FERR in the last 72 hours.    No results found for: FOL, RBCF    No results for input(s): PH, PCO2, PO2 in the last 72 hours.   No results for input(s): CPK, CKNDX, TROIQ in the last 72 hours.      No lab exists for component: CPKMB     Lab Results         Component  Value  Date/Time            Cholesterol, total  219 (H)  10/30/2018 10:27 AM       HDL Cholesterol  63  10/30/2018 10:27 AM       LDL, calculated  130 (H)  10/30/2018 10:27 AM       Triglyceride  131  10/30/2018 10:27 AM            CHOL/HDL Ratio  3.4  12/26/2007 09:12 AM          Lab Results         Component  Value  Date/Time            Glucose (POC)  149 (H)  12/12/2019 11:26 AM       Glucose (POC)  114 (H)  12/12/2019 06:04 AM       Glucose (POC)  161 (H)  12/11/2019 09:18 PM       Glucose (POC)  127 (H)  12/11/2019 04:50 PM            Glucose (POC)  196 (H)  12/11/2019 11:32 AM        No results found for: COLOR, APPRN, SPGRU, REFSG, PHU, PROTU, GLUCU, KETU,  BILU, UROU, NITU, LEUKU, GLUKE, EPSU, BACTU, WBCU, RBCU, CASTS, UCRY           Medications Reviewed:  Current Facility-Administered Medications          Medication  Dose  Route  Frequency           ?  amLODIPine (NORVASC) tablet 5 mg   5 mg  Oral  DAILY     ?  [Held by provider] losartan (COZAAR) tablet 50 mg   50 mg  Oral  DAILY     ?  lactulose (CHRONULAC) 10 gram/15 mL solution 30 mL   20 g  Oral  BID     ?  alteplase (CATHFLO) 1 mg in sterile water (preservative free) 1 mL injection   1 mg  InterCATHeter  PRN     ?  insulin glargine (LANTUS) injection 12 Units   12 Units  SubCUTAneous  QHS     ?  acetaminophen (TYLENOL) tablet 650 mg   650 mg  Oral  Q6H     ?  gabapentin (NEURONTIN) capsule 300 mg   300 mg  Oral  QHS     ?  gabapentin (NEURONTIN) capsule 100 mg   100 mg  Oral  DAILY     ?  sodium chloride (NS) flush 5-40 mL   5-40 mL  IntraVENous  Q8H     ?  sodium chloride (NS) flush 5-40 mL   5-40 mL  IntraVENous  PRN     ?  sodium phosphate (FLEET'S) enema 1 Enema   1 Enema  Rectal  PRN     ?  oxyCODONE IR (ROXICODONE) tablet 5 mg   5 mg  Oral  Q4H PRN     ?  docusate sodium (COLACE) capsule 100 mg   100 mg  Oral  BID     ?  senna (SENOKOT) tablet 8.6 mg   1 Tab  Oral  DAILY     ?  polyethylene glycol (MIRALAX) packet 17 g   17 g  Oral  DAILY PRN     ?  bisacodyL (DULCOLAX) suppository 10 mg   10 mg  Rectal  DAILY PRN     ?  [Held by provider] hydroCHLOROthiazide (HYDRODIURIL) tablet 25 mg   25 mg  Oral  DAILY     ?  sodium chloride (NS) flush 5-40 mL   5-40 mL  IntraVENous  Q8H     ?  sodium chloride (NS) flush 5-40 mL   5-40 mL  IntraVENous  PRN     ?  acetaminophen (TYLENOL) suppository 650 mg   650 mg  Rectal  Q6H PRN     ?  promethazine (PHENERGAN) tablet 12.5 mg   12.5 mg  Oral  Q6H PRN          Or           ?  ondansetron (ZOFRAN) injection 4 mg   4 mg  IntraVENous  Q6H PRN     ?  glucose chewable tablet 16 g   4 Tab  Oral  PRN     ?  dextrose (D50W) injection syrg 12.5-25 g   25-50 mL   IntraVENous  PRN     ?  glucagon (GLUCAGEN) injection 1 mg   1 mg  IntraMUSCular  PRN     ?  insulin lispro (HUMALOG) injection     SubCUTAneous  AC&HS     ?  aspirin delayed-release tablet 81 mg   81 mg  Oral  DAILY     ?  pantoprazole (PROTONIX) tablet 40 mg   40 mg  Oral  ACB           ?  enoxaparin (LOVENOX) injection 40 mg   40 mg  SubCUTAneous  Q24H        ______________________________________________________________________   EXPECTED LENGTH OF STAY: 4d 2h   ACTUAL LENGTH OF STAY:          [place], MD

## 2019-12-12 NOTE — Progress Notes (Signed)
Vascular:    No new issues. Will leave remaining staples for now.

## 2019-12-12 NOTE — Progress Notes (Signed)
Physical Therapy  12/12/2019    Chart reviewed. Noted pt's d/c delayed as pt now w/ fever of 100.5 at this time. Will defer and follow up tomorrow as able and appropriate.     Miranda Means, PTA

## 2019-12-12 NOTE — Progress Notes (Signed)
Bedside and Verbal shift change report given to Dorathy Daft, Charity fundraiser (Cabin crew) by Harlow Ohms, RN (offgoing nurse). Report included the following information SBAR, ED Summary, Procedure Summary, MAR and Recent Results.

## 2019-12-12 NOTE — Progress Notes (Signed)
TOC: Authorization cleared for discharge. Patient plans to discharge to [facility] today at 4pm with BLS/[facility] to transport. MD and RN notified. RN to call report to [phone]. Patient going to room 101. CM following for discharge needs.     Medicare pt has received, reviewed, and signed 2nd IM letter informing them of their right to appeal the discharge.  Signed copy has been placed on pt bedside chart.    Update 1539: Patient running fever and discharge cancelled per MD. Called [facility] and cancelled and called [facility] and spoke to [place] and cancelled admission for today.     RUR: 17%    Nathanial Millman RN/CRM

## 2019-12-12 NOTE — Discharge Summary (Signed)
Discharge Instructions/Summary     Patient: Lindsey Huynh       MRN: [id number]       Date of birth: 01-20-1951       Age: 69 y.o.     Date of admission:  11/23/2019    Date of discharge:  12/12/2019    Primary care provider:  Karsten Fells, NP     Admitting provider:  Donato Schultz, MD    Discharging provider:  Cheryll Cockayne, MD , to contact this individual call [phone] and ask the operator to page, if unavailable ask for the triage hospitalist to be paged.    Consultations  ?? IP CONSULT TO VASCULAR SURGERY  ?? IP CONSULT TO PODIATRY  ?? IP CONSULT TO INFECTIOUS DISEASES  ?? IP CONSULT TO CARDIOLOGY    Procedures/Surgeries  Procedure(s):  LEFT TRANSMETATARSAL AMPUTATION WITH TENDON BALANCING    Discharge destination: SNF- [facility].  The patient is stable for discharge.    Admission diagnosis  ?? Gangrene ([place]) [place] COURSE:  Per HPI: Lindsey Huynh??is a 69 y.o.??female??with past medical history of diabetes, insulin-dependent, GERD and hypertension comes for return of left foot pain and swelling.  ??  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 First distal phalanx osteomyelitis.  - completed cefepime. Appreciate ID input.   - surgical margin clear  - Vascular surgery: follow up in 2 weeks post- op. Staples removed today   - appreciate Podiatry input   - started on aspirin   - pain meds   ??  Anemia   -  No signs of active bleeding noted. hgb low stable   - Monitor labs and transfuse if hgb <7.0??  ??  DMII:??hold metformin while IP  -Cont basal insulin, SSI  -Diabetic diet  -a1c 8.1  - DM following??  ??  HTN  -Continue amlodipine. discontinuing Cozaar and HCTZ due to soft BP   ??  GERD  -Cont PPI      Current Discharge Medication List      START  taking these medications    Details   acetaminophen (TYLENOL) 325 mg tablet Take 2 Tabs by mouth every six (6) hours as needed for Pain.  Qty: 10 Tab, Refills: 0      docusate sodium (COLACE) 100 mg capsule Take 1 Cap by mouth two (2) times daily as needed for Constipation for up to 90 days.  Qty: 60 Cap, Refills: 0      oxyCODONE IR (ROXICODONE) 5 mg immediate release tablet Take 1 Tab by mouth every six (6) hours as needed for Pain for up to 3 days. Max Daily Amount: 20 mg.  Qty: 10 Tab, Refills: 0    Associated Diagnoses: Gangrene ([place])      !! aspirin delayed-release 81 mg tablet Take 1 Tab by mouth daily.  Qty: 30 Tab, Refills: 0       !! - Potential duplicate medications found. Please discuss with provider.      CONTINUE these medications which have CHANGED    Details   insulin detemir U-100 (Levemir FlexTouch U-100 Insuln) 100 unit/mL (3 mL) inpn 12 Units by SubCUTAneous route daily (with breakfast).  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); Type 2 diabetes mellitus without complication, unspecified whether long term insulin use (H[facility])      amLODIPine (NORVASC) 10 mg tablet Take 1 Tab by mouth daily.  Qty: 30 Tab, Refills: 0    Associated Diagnoses: Essential hypertension      gabapentin (NEURONTIN) 300 mg capsule Take 1 Cap by mouth nightly. Max Daily Amount: 300 mg.  Qty: 30 Cap, Refills: 0    Associated Diagnoses: Diabetic polyneuropathy associated with type 2 diabetes mellitus (H[facility])      Insulin Needles, Disposable, (Nano Pen Needle) 32 gauge x 5/32" ndle Use with insulin pen 3 times daily  Qty: 100 Pen Needle, Refills:  0      cyclopentolate (CYCLOGYL) 1 % ophthalmic solution       !! aspirin delayed-release 81 mg tablet Take 81 mg by mouth daily.      omeprazole (PRILOSEC) 20 mg capsule Take 20 mg by mouth daily.       !! - Potential duplicate medications found. Please discuss with provider.      STOP taking these medications       valsartan-hydroCHLOROthiazide (DIOVAN-HCT) 160-25 mg per tablet Comments:   Reason for Stopping:                 FOLLOW-UP CARE RECOMMENDATIONS/TESTING/NURSING ORDERS:  ?? PT/OT  ?? Cbc on 3 days   ?? Bowel regimen  ?? Monitor BP       DIET:  Cardiac Diet and Diabetic Diet    ACTIVITY:  Activity as tolerated, PT/OT to evaluate and treat and OOB for meals    APPOINTMENTS:  ?? Follow-up with primary care provider, Dr. Ermalinda Huynh, Gae Gallop, NP  -  Please call to set up an appointment to be seen in  1 week   ?? Podiatry in 1 week  ?? Vascular surgery in 2 weeks  ?? Cardiology in 1 -2 weeks       PENDING TEST RESULTS:  At the time of discharge the following test results are still pending: none .   Please review these results as they become available.    Specific symptoms to watch for: chest pain, shortness of breath, fever, chills, nausea, vomiting, diarrhea, change in mentation, falling, weakness, bleeding.    GOALS OF CARE:  X  Eventual return to home/independent/assisted living     Long term SNF      Hospice     No rehospitalization     SOCIAL HISTORY:     Social History     Socioeconomic History   ??? Marital status: SINGLE     Spouse name: Not on file   ??? Number of children: Not on file   ??? Years of education: Not on file   ??? Highest education level: Not on file   Occupational History   ??? Not on file   Social Needs   ??? Financial resource strain: Not on file   ??? Food insecurity     Worry: Not on file     Inability: Not on file   ??? Transportation needs     Medical: Not on file     Non-medical: Not on file   Tobacco Use   ???  Smoking status: Former Smoker     Years: 10.00   ??? Smokeless tobacco: Never Used   Substance and  Sexual Activity   ??? Alcohol use: Yes     Alcohol/week: 5.0 standard drinks     Types: 6 Cans of beer per week   ??? Drug use: No   ??? Sexual activity: Not Currently   Lifestyle   ??? Physical activity     Days per week: Not on file     Minutes per session: Not on file   ??? Stress: Not on file   Relationships   ??? Social Product manager on phone: Not on file     Gets together: Not on file     Attends religious service: Not on file     Active member of club or organization: Not on file     Attends meetings of clubs or organizations: Not on file     Relationship status: Not on file   ??? Intimate partner violence     Fear of current or ex partner: Not on file     Emotionally abused: Not on file     Physically abused: Not on file     Forced sexual activity: Not on file   Other Topics Concern   ??? Not on file   Social History Narrative   ??? Not on file       Patient condition at discharge:   Functional status    Poor    X  Deconditioned      Independent   Cognition    Lucid     Forgetful (some sensescence)     Dementia   Catheters/lines (plus indication)    Foley     PICC      PEG         Code status  X  Full code      DNR         CHRONIC MEDICAL CONDITIONS:  Problem List as of 12/12/2019 Date Reviewed: 06/09/18          Codes Class Noted - Resolved    Gangrene ([place]) ICD-10-CM: [id number]  ICD-9-CM: 785.4  11/23/2019 - Present        Type 2 diabetes mellitus with diabetic neuropathy ([place]) ICD-10-CM: E11.40  ICD-9-CM: 250.60, 357.2  10/30/2018 - Present        Type 2 diabetes with nephropathy ([place]) ICD-10-CM: E11.21  ICD-9-CM: 250.40, 583.81  02/23/2017 - Present        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  ICD-9-CM: 365.9  06/03/2016 - Present        Cataract of left eye ICD-10-CM: H26.9  ICD-9-CM: 366.9  06/03/2016 - Present        Postmenopausal ICD-10-CM: Z78.0  ICD-9-CM: V49.81  02/18/2014 - Present        Constipation ICD-10-CM: K59.00  ICD-9-CM: 564.00  01/03/2013 - Present        HTN (hypertension) ICD-10-CM: I10  ICD-9-CM: 401.9   12/11/2012 - Present        GERD (gastroesophageal reflux disease) ICD-10-CM: K21.9  ICD-9-CM: 530.81  12/11/2012 - Present        Hypercholesterolemia ICD-10-CM: E78.00  ICD-9-CM: 272.0  12/11/2012 - Present        Diabetes ([place]) ICD-10-CM: E11.9  ICD-9-CM: 250.00  12/11/2012 - Present                  Physical examination at discharge  Visit Vitals  BP 122/76 (BP  1 Location: Right upper arm, BP Patient Position: At rest)   Pulse 88   Temp 98.6 ??F (37 ??C)   Resp 19   Ht 5\' 5"  (1.651 m)   Wt 76.7 kg (169 lb)   SpO2 99%   BMI 28.12 kg/m??     Constitutional:  No acute distress, cooperative   ENT:  Oral mucosa moist, oropharynx benign.    Resp:  CTA bilaterally.   CV:  Regular rhythm, normal rate,     GI:  Soft, non distended, non tender.bs+    Musculoskeletal:  surgical site intact     Neurologic:  Moves all extremities.  AAOx3, CN II-XII reviewed, follows commands                         Psych:  Good insight, Not anxious nor agitated.   ??      Significant Diagnostic Studies:   11/23/2019: BUN 17 MG/DL (Ref range: 6 - 20 MG/DL); Calcium 10.0 MG/DL (Ref range: 8.5 - 10.1 MG/DL); CO2 30 mmol/L (Ref range: 21 - 32 mmol/L); Creatinine 0.74 MG/DL (Ref range: 0.55 - 1.02 MG/DL); Glucose PLEASE DISREGARD RESULTS mg/dL (Ref range: 50 - 100 mg/dL); HCT 34.3 %* (Ref range: 35.0 - 47.0 %); HGB 11.7 g/dL (Ref range: 11.5 - 16.0 g/dL); Potassium 3.2 mmol/L* (Ref range: 3.5 - 5.1 mmol/L); Sodium 138 mmol/L (Ref range: 136 - 145 mmol/L)  11/24/2019: BUN 11 MG/DL (Ref range: 6 - 20 MG/DL); Calcium 9.8 MG/DL (Ref range: 8.5 - 10.1 MG/DL); CO2 27 mmol/L (Ref range: 21 - 32 mmol/L); Creatinine 0.69 MG/DL (Ref range: 0.55 - 1.02 MG/DL); Glucose 95 mg/dL (Ref range: 65 - 100 mg/dL); HCT 33.2 %* (Ref range: 35.0 - 47.0 %); HGB 11.6 g/dL (Ref range: 11.5 - 16.0 g/dL); Potassium 3.1 mmol/L* (Ref range: 3.5 - 5.1 mmol/L); Sodium 137 mmol/L (Ref range: 136 - 145 mmol/L)  Recent Labs     12/12/19  0152 12/11/19  0530   WBC 7.1 7.6   HGB 8.3* 7.7*   HCT  24.8* 23.0*   PLT 393 346     Recent Labs     12/12/19  0152 12/11/19  0530 12/10/19  0532   NA 134* 134* 137   K 3.9 4.3 3.9   CL 103 105 108   CO2 25 22 24    BUN 12 11 12    CREA 0.90 0.84 0.90   GLU 103* 115* 84   CA 9.2 9.0 9.1     No results for input(s): AP, TBIL, TP, ALB, GLOB, GGT, AML, LPSE in the last 72 hours.    No lab exists for component: SGOT, GPT, AMYP, HLPSE  No results for input(s): INR, PTP, APTT, INREXT in the last 72 hours.   No results for input(s): FE, TIBC, PSAT, FERR in the last 72 hours.   No results for input(s): PH, PCO2, PO2 in the last 72 hours.  No results for input(s): CPK, CKMB in the last 72 hours.    No lab exists for component: TROPONINI  No components found for: [facility]    Pertinent imaging studies:    Per EMR       Time spent on discharge related activities today greater than 30 minutes.      Signed:  Cheryll Cockayne, MD                 Hospitalist  12/12/2019                 1:29 PM        Cc: Lindsey Fells, NP

## 2019-12-12 NOTE — Progress Notes (Signed)
[facility]                                                                                          Hospitalist Progress Note  Cheryll Cockayne, MD  Answering service: [phone] OR [phone] from in house phone        Date of Service:  12/12/2019  NAME:  Lindsey Huynh  DOB:  1950/10/05  MRN:  [id number]      Admission Summary:     "Ibet Pelissier??is a 69 y.o.??female??with past medical history of diabetes, insulin-dependent, GERD and hypertension comes for return of left foot pain and swelling.    Interval history / Subjective:     F/U for left foot cellulitis, left great toe gangrene  Patient is seen and examined at bedside this AM. No overnight events or new complaints. Waiting for placement.   Discharge plan held in the afternoon as pt spiked fever 101 - no other symptoms of infection  Septic work up ordered - cbc, lactic, blood cx, rapid covid, UA and CXR      Assessment & Plan:     Left great toe Osteomyelitis s/p TMA   PAD: Status post femoropopliteal bypass on November 28, 2019  LLE pain:??dry gangrene??of??left 2nd toe cellulitic changes/Left popliteal occlusion  ??- MRI LLE:??1. First distal phalanx osteomyelitis.  - completed cefepime. Appreciate ID input.   - surgical margin clear  - Vascular surgery following- Patient to follow up in 2 weeks post- op  - appreciate Podiatry input   - started on aspirin   ??  Anemia   -  No signs of bleeding noted. hgb low stable   - Monitor labs and transfuse if hgb <7.0??  ??  DMII:??hold metformin while IP  -Cont basal insulin, SSI  -Diabetic diet  -a1c 8.1  - DM following??  ??  HTN  -Continue amlodipine. holding Cozaar and HCTZ due to soft BP   ??  GERD  -Cont PPI    Code status: Full   DVT prophylaxis: Lovenox     Care Plan discussed with: Patient/Family, Nurse and Case Manager  Anticipated Disposition: TBD  Anticipated Discharge: [facility] Problems  Date Reviewed: 06-21-2018          Codes Class Noted POA    Gangrene ([place])  ICD-10-CM: [id number]  ICD-9-CM: 785.4  11/23/2019 Unknown                Review of Systems:   A comprehensive review of systems was negative except for that written in the HPI.     Xr Foot Lt Ap/lat    Result Date: 12/05/2019  1. Transmetatarsal amputation.    Xr Foot Lt Min 3 V    Result Date: 11/23/2019  No acute abnormality.    Mri Low Ext Lt Wo Cont    Result Date: 12/01/2019  1. First distal phalanx osteomyelitis. 2. No abscess.     Ct Low Ext Lt W Cont    Result Date: 11/24/2019  No CT imaging evidence for soft tissue gas, abscess or osteomyelitis.  Xr Chest Port    Result Date: 12/12/2019  No evidence of acute cardiopulmonary process.     Retail banker Technologist Service    Result Date: 11/25/2019  FLUOROSCOPY WAS USED. Fluoro Dose:  144.09 mGy. vk    Vital Signs:    Last 24hrs VS reviewed since prior progress note. Most recent are:  Visit Vitals  BP (!) 146/72 (BP 1 Location: Left upper arm, BP Patient Position: At rest)   Pulse 97   Temp (!) 100.5 ??F (38.1 ??C)   Resp 14   Ht 5\' 5"  (1.651 m)   Wt 76.7 kg (169 lb)   SpO2 97%   BMI 28.12 kg/m??       No intake or output data in the 24 hours ending 12/12/19 1626     Physical Examination:             Constitutional:  No acute distress, cooperative   ENT:  Oral mucosa moist, oropharynx benign.    Resp:  CTA bilaterally.   CV:  Regular rhythm, normal rate,     GI:  Soft, non distended, non tender.bs+    Musculoskeletal:  LLE dressing c/d  staples intact    Neurologic:  Moves all extremities.  AAOx3, CN II-XII reviewed, follows commands     Psych:  Good insight, Not anxious nor agitated.     ??            Data Review:    Review and/or order of clinical lab test  Review and/or order of tests in the medicine section of CPT      Labs:     Recent Labs     12/12/19  0152 12/11/19  0530   WBC 7.1 7.6   HGB 8.3* 7.7*   HCT 24.8* 23.0*   PLT 393 346     Recent Labs     12/12/19  0152 12/11/19  0530 12/10/19  0532   NA 134* 134* 137   K 3.9 4.3 3.9   CL 103 105 108   CO2 25 22 24    BUN 12 11 12     CREA 0.90 0.84 0.90   GLU 103* 115* 84   CA 9.2 9.0 9.1     No results for input(s): ALT, AP, TBIL, TBILI, TP, ALB, GLOB, GGT, AML, LPSE in the last 72 hours.    No lab exists for component: SGOT, GPT, AMYP, HLPSE  No results for input(s): INR, PTP, APTT, INREXT, INREXT in the last 72 hours.   No results for input(s): FE, TIBC, PSAT, FERR in the last 72 hours.   No results found for: FOL, RBCF   No results for input(s): PH, PCO2, PO2 in the last 72 hours.  No results for input(s): CPK, CKNDX, TROIQ in the last 72 hours.    No lab exists for component: CPKMB  Lab Results   Component Value Date/Time    Cholesterol, total 219 (H) 10/30/2018 10:27 AM    HDL Cholesterol 63 10/30/2018 10:27 AM    LDL, calculated 130 (H) 10/30/2018 10:27 AM    Triglyceride 131 10/30/2018 10:27 AM    CHOL/HDL Ratio 3.4 12/26/2007 09:12 AM     Lab Results   Component Value Date/Time    Glucose (POC) 149 (H) 12/12/2019 11:26 AM    Glucose (POC) 114 (H) 12/12/2019 06:04 AM    Glucose (POC) 161 (H) 12/11/2019 09:18 PM    Glucose (POC) 127 (H) 12/11/2019 04:50 PM    Glucose (POC)  196 (H) 12/11/2019 11:32 AM     No results found for: COLOR, APPRN, SPGRU, REFSG, PHU, PROTU, GLUCU, KETU, BILU, UROU, NITU, LEUKU, GLUKE, EPSU, BACTU, WBCU, RBCU, CASTS, UCRY      Medications Reviewed:     Current Facility-Administered Medications   Medication Dose Route Frequency   ??? amLODIPine (NORVASC) tablet 5 mg  5 mg Oral DAILY   ??? [Held by provider] losartan (COZAAR) tablet 50 mg  50 mg Oral DAILY   ??? lactulose (CHRONULAC) 10 gram/15 mL solution 30 mL  20 g Oral BID   ??? alteplase (CATHFLO) 1 mg in sterile water (preservative free) 1 mL injection  1 mg InterCATHeter PRN   ??? insulin glargine (LANTUS) injection 12 Units  12 Units SubCUTAneous QHS   ??? acetaminophen (TYLENOL) tablet 650 mg  650 mg Oral Q6H   ??? gabapentin (NEURONTIN) capsule 300 mg  300 mg Oral QHS   ??? gabapentin (NEURONTIN) capsule 100 mg  100 mg Oral DAILY   ??? sodium chloride (NS) flush 5-40 mL   5-40 mL IntraVENous Q8H   ??? sodium chloride (NS) flush 5-40 mL  5-40 mL IntraVENous PRN   ??? sodium phosphate (FLEET'S) enema 1 Enema  1 Enema Rectal PRN   ??? oxyCODONE IR (ROXICODONE) tablet 5 mg  5 mg Oral Q4H PRN   ??? docusate sodium (COLACE) capsule 100 mg  100 mg Oral BID   ??? senna (SENOKOT) tablet 8.6 mg  1 Tab Oral DAILY   ??? polyethylene glycol (MIRALAX) packet 17 g  17 g Oral DAILY PRN   ??? bisacodyL (DULCOLAX) suppository 10 mg  10 mg Rectal DAILY PRN   ??? [Held by provider] hydroCHLOROthiazide (HYDRODIURIL) tablet 25 mg  25 mg Oral DAILY   ??? sodium chloride (NS) flush 5-40 mL  5-40 mL IntraVENous Q8H   ??? sodium chloride (NS) flush 5-40 mL  5-40 mL IntraVENous PRN   ??? acetaminophen (TYLENOL) suppository 650 mg  650 mg Rectal Q6H PRN   ??? promethazine (PHENERGAN) tablet 12.5 mg  12.5 mg Oral Q6H PRN    Or   ??? ondansetron (ZOFRAN) injection 4 mg  4 mg IntraVENous Q6H PRN   ??? glucose chewable tablet 16 g  4 Tab Oral PRN   ??? dextrose (D50W) injection syrg 12.5-25 g  25-50 mL IntraVENous PRN   ??? glucagon (GLUCAGEN) injection 1 mg  1 mg IntraMUSCular PRN   ??? insulin lispro (HUMALOG) injection   SubCUTAneous AC&HS   ??? aspirin delayed-release tablet 81 mg  81 mg Oral DAILY   ??? pantoprazole (PROTONIX) tablet 40 mg  40 mg Oral ACB   ??? enoxaparin (LOVENOX) injection 40 mg  40 mg SubCUTAneous Q24H     ______________________________________________________________________  EXPECTED LENGTH OF STAY: 4d 2h  ACTUAL LENGTH OF STAY:          [place], MD

## 2019-12-12 NOTE — Other (Signed)
Bedside shift change report given to Peyton, RN (oncoming nurse) by Yulonda [place], RN (offgoing nurse). Report included the following information SBAR

## 2019-12-13 LAB — GLUCOSE, POC
Glucose (POC): 109 mg/dL — ABNORMAL HIGH (ref 65–100)
Glucose (POC): 89 mg/dL (ref 65–100)
Glucose (POC): 130 mg/dL — ABNORMAL HIGH (ref 65–100)
Glucose (POC): 129 mg/dL — ABNORMAL HIGH (ref 65–100)

## 2019-12-13 LAB — URINALYSIS W/ REFLEX CULTURE
BACTERIA, URINE: NEGATIVE /hpf
Bacteria: NEGATIVE /hpf
Bilirubin, Urine: NEGATIVE
Bilirubin: NEGATIVE
Glucose, Ur: NEGATIVE mg/dL
Glucose: NEGATIVE mg/dL
Ketone: NEGATIVE mg/dL
Ketones, Urine: NEGATIVE mg/dL
Leukocyte Esterase, Urine: NEGATIVE
Leukocyte Esterase: NEGATIVE
Nitrite, Urine: NEGATIVE
Nitrites: NEGATIVE
Protein, UA: 100 mg/dL — AB
Protein: 100 mg/dL — AB
Specific Gravity, UA: 1.022 (ref 1.003–1.030)
Specific gravity: 1.022 (ref 1.003–1.030)
Urobilinogen, UA, POCT: 0.2 EU/dL (ref 0.2–1.0)
Urobilinogen: 0.2 EU/dL (ref 0.2–1.0)
pH (UA): 5.5 (ref 5.0–8.0)
pH, UA: 5.5 (ref 5.0–8.0)

## 2019-12-13 LAB — SARS-COV-2

## 2019-12-13 LAB — POCT GLUCOSE
POC Glucose: 109 mg/dL — ABNORMAL HIGH (ref 65–100)
POC Glucose: 89 mg/dL (ref 65–100)
POC Glucose: 130 mg/dL — ABNORMAL HIGH (ref 65–100)
POC Glucose: 129 mg/dL — ABNORMAL HIGH (ref 65–100)

## 2019-12-13 LAB — COVID-19

## 2019-12-13 MED ORDER — PIPERACILLIN-TAZOBACTAM 3.375 GRAM IV SOLR
3.375 gram | Freq: Three times a day (TID) | INTRAVENOUS | Status: DC
Start: 2019-12-13 — End: 2019-12-16
  Administered 2019-12-13 – 2019-12-16 (×10): via INTRAVENOUS

## 2019-12-13 MED ORDER — VANCOMYCIN IN 0.9 % SODIUM CHLORIDE 2 GRAM/500 ML IV
2 gram/500 mL | Freq: Once | INTRAVENOUS | Status: AC
Start: 2019-12-13 — End: 2019-12-13
  Administered 2019-12-13: 19:00:00 via INTRAVENOUS

## 2019-12-13 MED ORDER — PHARMACY VANCOMYCIN NOTE
Status: DC
Start: 2019-12-13 — End: 2019-12-15

## 2019-12-13 MED ORDER — VANCOMYCIN 1,000 MG IV SOLR
1000 mg | INTRAVENOUS | Status: DC
Start: 2019-12-13 — End: 2019-12-15
  Administered 2019-12-14 – 2019-12-15 (×2): via INTRAVENOUS

## 2019-12-13 MED ORDER — ACETAMINOPHEN 650 MG RECTAL SUPPOSITORY
650 mg | Freq: Four times a day (QID) | RECTAL | Status: DC | PRN
Start: 2019-12-13 — End: 2019-12-18

## 2019-12-13 MED FILL — PIPERACILLIN-TAZOBACTAM 3.375 GRAM IV SOLR: 3.375 gram | INTRAVENOUS | Qty: 3.38

## 2019-12-13 MED FILL — AMLODIPINE 5 MG TAB: 5 mg | ORAL | Qty: 1

## 2019-12-13 MED FILL — PANTOPRAZOLE 40 MG TAB, DELAYED RELEASE: 40 mg | ORAL | Qty: 1

## 2019-12-13 MED FILL — ACETAMINOPHEN 325 MG TABLET: 325 mg | ORAL | Qty: 2

## 2019-12-13 MED FILL — ENOXAPARIN 40 MG/0.4 ML SUB-Q SYRINGE: 40 mg/0.4 mL | SUBCUTANEOUS | Qty: 0.4

## 2019-12-13 MED FILL — GABAPENTIN 300 MG CAP: 300 mg | ORAL | Qty: 1

## 2019-12-13 MED FILL — OXYCODONE 5 MG TAB: 5 mg | ORAL | Qty: 1

## 2019-12-13 MED FILL — GABAPENTIN 100 MG CAP: 100 mg | ORAL | Qty: 1

## 2019-12-13 MED FILL — PHARMACY VANCOMYCIN NOTE: Qty: 1

## 2019-12-13 MED FILL — INSULIN GLARGINE 100 UNIT/ML INJECTION: 100 unit/mL | SUBCUTANEOUS | Qty: 1

## 2019-12-13 MED FILL — DOK 100 MG CAPSULE: 100 mg | ORAL | Qty: 1

## 2019-12-13 MED FILL — LACTULOSE 20 GRAM/30 ML ORAL SOLUTION: 20 gram/30 mL | ORAL | Qty: 30

## 2019-12-13 MED FILL — VANCOMYCIN 10 GRAM IV SOLR: 10 gram | INTRAVENOUS | Qty: 2000

## 2019-12-13 MED FILL — ASPIRIN 81 MG TAB, DELAYED RELEASE: 81 mg | ORAL | Qty: 1

## 2019-12-13 MED FILL — SENNA LAX 8.6 MG TABLET: 8.6 mg | ORAL | Qty: 1

## 2019-12-13 NOTE — Progress Notes (Signed)
This RN attempted to remove patient's staples. Patient requested staples be removed "another time." Patient states she "wants to rest."    [id number] This RN attempted again to remove patient's staples. Patient refused.

## 2019-12-13 NOTE — Progress Notes (Addendum)
Patient temperature 103. Dr. Fredonia Highland paged.    1827 Give tylenol per Dr. Fredonia Highland

## 2019-12-13 NOTE — Progress Notes (Signed)
ID Progress Note  12/13/2019       Left tma 3/18    Subjective:       Afebrile. No dyspnea, abdominal pain, headache or sore throat    ROS: No anaphylaxis, seizures, syncope, hematemesis, hematochezia     Objective:     Vitals:   Visit Vitals  BP 105/67 (BP 1 Location: Right upper arm, BP Patient Position: At rest)   Pulse 95   Temp 100.4 ??F (38 ??C)   Resp 19   Ht 5' 5"  (1.651 m)   Wt 76.7 kg (169 lb)   SpO2 100%   BMI 28.12 kg/m??        Tmax:  Temp (24hrs), Avg:99.9 ??F (37.7 ??C), Min:99.1 ??F (37.3 ??C), Max:100.4 ??F (38 ??C)      Exam:    Not in distress  Pink conjunctivae, anicteric sclerae  No cervical lymphdenopathy   Lung clear, no rales, wheezes or rhonchi   Heart: s1, s2, RRR, no murmurs rubs or clicks  Abdomen: soft nontender, no guarding or rebound  Knees not warm or tender  Left foot anterior leg and calf with no ascending cellulitis. The left TMA site is clean. There is no purulence or erythema.    Labs:   Lab Results   Component Value Date/Time    WBC 7.5 12/12/2019 04:20 PM    HGB 7.6 (L) 12/12/2019 04:20 PM    HCT 22.3 (L) 12/12/2019 04:20 PM    PLATELET 353 12/12/2019 04:20 PM    MCV 89.6 12/12/2019 04:20 PM     Lab Results   Component Value Date/Time    Sodium 134 (L) 12/12/2019 01:52 AM    Potassium 3.9 12/12/2019 01:52 AM    Chloride 103 12/12/2019 01:52 AM    CO2 25 12/12/2019 01:52 AM    Anion gap 6 12/12/2019 01:52 AM    Glucose 103 (H) 12/12/2019 01:52 AM    BUN 12 12/12/2019 01:52 AM    Creatinine 0.90 12/12/2019 01:52 AM    BUN/Creatinine ratio 13 12/12/2019 01:52 AM    GFR est AA >60 12/12/2019 01:52 AM    GFR est non-AA >60 12/12/2019 01:52 AM    Calcium 9.2 12/12/2019 01:52 AM    Bilirubin, total 0.2 11/23/2019 01:32 PM    Alk. phosphatase 150 (H) 11/23/2019 01:32 PM    Protein, total 7.8 11/23/2019 01:32 PM    Albumin 4.0 11/23/2019 01:32 PM    Globulin 3.8 11/23/2019 01:32 PM    A-G Ratio 1.1 11/23/2019 01:32 PM    ALT (SGPT) 23 11/23/2019 01:32 PM             Assessment:     #1 diabetic  left foot infection with osteomyelitis of the left great toe  ??  #2 hyperlipidemia  ??  #3 hypertension  ??  #4 diabetes  ??  #5 new onset fever    Recommendations:       Had TMA 3/18. Pathology of proximal margin shows no osteo.     She has new onset fever. I agree with vancomycin and Zosyn. The TMA site is clean. This is not the source of the infection. Follow-up cultures and Covid test.    Blair Hailey, MD

## 2019-12-13 NOTE — Progress Notes (Signed)
[facility]                                                                                          Hospitalist Progress Note  Lindsey Cockayne, MD  Answering service: [phone] OR [phone] from in house phone        Date of Service:  12/13/2019  NAME:  [facility]  DOB:  1950-10-16  MRN:  [id number]      Admission Summary:     "[facility]sey Huynh??is a 69 y.o.??female??with past medical history of diabetes, insulin-dependent, GERD and hypertension comes for return of left foot pain and swelling.    Interval history / Subjective:     F/U for left foot cellulitis, left great toe gangrene  Patient is seen and examined at bedside this AM. Fever spike yesterday afternoon- felt warm, denied cough, sore throat, abd pain, urinary or bowel changes. 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 First distal phalanx osteomyelitis.  - completed cefepime. Appreciate ID input.   - surgical margin clear  - Vascular surgery following- Patient to follow up in 2 weeks post- op  - appreciate Podiatry input   - started on aspirin   ??  Fever 3/25 and 3/26  - normal wbc, lactic, procalcitonin  - UA clean. CXR: normal  - rapid covid negative. pcr ordered  - blood cx : NGTD  - surgical site - no drainage  - started on broad spectrum abx IV Vanco and zosyn  - ID reconsulted     Anemia   -  No signs of bleeding noted. hgb low stable   - Monitor labs and transfuse if hgb <7.0??  ??  DMII:??hold metformin while IP  -Cont basal insulin, SSI  -Diabetic diet  -a1c 8.1  - DM education   ??  HTN  -Continue amlodipine. holding Cozaar and HCTZ due to soft BP   ??  GERD  -Cont PPI    Code status: Full   DVT prophylaxis: Lovenox     Care Plan discussed with:  Patient/Family, Nurse and Case Manager  Anticipated Disposition: TBD  Anticipated Discharge: [facility] Problems  Date Reviewed: 11-Jun-2018          Codes Class Noted POA    Gangrene ([place]) ICD-10-CM: [id number]  ICD-9-CM: 785.4  11/23/2019 Unknown                Review of Systems:   A comprehensive review of systems was negative except for that written in the HPI.     Xr Foot Lt Ap/lat    Result Date: 12/05/2019  1. Transmetatarsal amputation.    Xr Foot Lt Min 3 V    Result Date: 11/23/2019  No acute abnormality.    Mri Low Ext Lt Wo Cont  Result Date: 12/01/2019  1. First distal phalanx osteomyelitis. 2. No abscess.     Ct Low Ext Lt W Cont    Result Date: 11/24/2019  No CT imaging evidence for soft tissue gas, abscess or osteomyelitis.    Xr Chest Port    Result Date: 12/12/2019  No evidence of acute cardiopulmonary process.     Retail banker Technologist Service    Result Date: 11/25/2019  FLUOROSCOPY WAS USED. Fluoro Dose:  144.09 mGy. vk    Vital Signs:    Last 24hrs VS reviewed since prior progress note. Most recent are:  Visit Vitals  BP 105/67 (BP 1 Location: Right upper arm, BP Patient Position: At rest)   Pulse 95   Temp 100.4 ??F (38 ??C)   Resp 19   Ht 5\' 5"  (1.651 m)   Wt 76.7 kg (169 lb)   SpO2 100%   BMI 28.12 kg/m??         Intake/Output Summary (Last 24 hours) at 12/13/2019 1149  Last data filed at 12/13/2019 [id number]  Gross per 24 hour   Intake ???   Output 500 ml   Net -500 ml        Physical Examination:             Constitutional:  No acute distress, cooperative   ENT:  Oral mucosa moist, oropharynx benign.    Resp:  CTA bilaterally.   CV:  Regular rhythm, normal rate,     GI:  Soft, non distended, non tender.bs+    Musculoskeletal:  LLE dressing    Neurologic:  Moves all extremities.  AAOx3, CN II-XII reviewed, follows commands     Psych:  Good insight, Not anxious nor agitated.     ??            Data Review:    Review and/or order of clinical lab test  Review and/or order of tests in the medicine section of CPT       Labs:     Recent Labs     12/12/19  1620 12/12/19  0152   WBC 7.5 7.1   HGB 7.6* 8.3*   HCT 22.3* 24.8*   PLT 353 393     Recent Labs     12/12/19  0152 12/11/19  0530   NA 134* 134*   K 3.9 4.3   CL 103 105   CO2 25 22   BUN 12 11   CREA 0.90 0.84   GLU 103* 115*   CA 9.2 9.0     No results for input(s): ALT, AP, TBIL, TBILI, TP, ALB, GLOB, GGT, AML, LPSE in the last 72 hours.    No lab exists for component: SGOT, GPT, AMYP, HLPSE  No results for input(s): INR, PTP, APTT, INREXT, INREXT in the last 72 hours.   No results for input(s): FE, TIBC, PSAT, FERR in the last 72 hours.   No results found for: FOL, RBCF   No results for input(s): PH, PCO2, PO2 in the last 72 hours.  No results for input(s): CPK, CKNDX, TROIQ in the last 72 hours.    No lab exists for component: CPKMB  Lab Results   Component Value Date/Time    Cholesterol, total 219 (H) 10/30/2018 10:27 AM    HDL Cholesterol 63 10/30/2018 10:27 AM    LDL, calculated 130 (H) 10/30/2018 10:27 AM    Triglyceride 131 10/30/2018 10:27 AM    CHOL/HDL Ratio 3.4 12/26/2007 09:12 AM  Lab Results   Component Value Date/Time    Glucose (POC) 130 (H) 12/13/2019 10:59 AM    Glucose (POC) 89 12/13/2019 06:12 AM    Glucose (POC) 109 (H) 12/12/2019 09:37 PM    Glucose (POC) 156 (H) 12/12/2019 04:41 PM    Glucose (POC) 149 (H) 12/12/2019 11:26 AM     Lab Results   Component Value Date/Time    Color YELLOW/STRAW 12/13/2019 06:24 AM    Appearance CLEAR 12/13/2019 06:24 AM    Specific gravity 1.022 12/13/2019 06:24 AM    pH (UA) 5.5 12/13/2019 06:24 AM    Protein 100 (A) 12/13/2019 06:24 AM    Glucose Negative 12/13/2019 06:24 AM    Ketone Negative 12/13/2019 06:24 AM    Bilirubin Negative 12/13/2019 06:24 AM    Urobilinogen 0.2 12/13/2019 06:24 AM    Nitrites Negative 12/13/2019 06:24 AM    Leukocyte Esterase Negative 12/13/2019 06:24 AM    Epithelial cells FEW 12/13/2019 06:24 AM    Bacteria Negative 12/13/2019 06:24 AM    WBC 0-4 12/13/2019 06:24 AM    RBC 0-5 12/13/2019  06:24 AM         Medications Reviewed:     Current Facility-Administered Medications   Medication Dose Route Frequency   ??? acetaminophen (TYLENOL) suppository 650 mg  650 mg Rectal Q6H PRN   ??? amLODIPine (NORVASC) tablet 5 mg  5 mg Oral DAILY   ??? [Held by provider] losartan (COZAAR) tablet 50 mg  50 mg Oral DAILY   ??? lactulose (CHRONULAC) 10 gram/15 mL solution 30 mL  20 g Oral BID   ??? alteplase (CATHFLO) 1 mg in sterile water (preservative free) 1 mL injection  1 mg InterCATHeter PRN   ??? insulin glargine (LANTUS) injection 12 Units  12 Units SubCUTAneous QHS   ??? acetaminophen (TYLENOL) tablet 650 mg  650 mg Oral Q6H   ??? gabapentin (NEURONTIN) capsule 300 mg  300 mg Oral QHS   ??? gabapentin (NEURONTIN) capsule 100 mg  100 mg Oral DAILY   ??? sodium chloride (NS) flush 5-40 mL  5-40 mL IntraVENous Q8H   ??? sodium chloride (NS) flush 5-40 mL  5-40 mL IntraVENous PRN   ??? sodium phosphate (FLEET'S) enema 1 Enema  1 Enema Rectal PRN   ??? oxyCODONE IR (ROXICODONE) tablet 5 mg  5 mg Oral Q4H PRN   ??? docusate sodium (COLACE) capsule 100 mg  100 mg Oral BID   ??? senna (SENOKOT) tablet 8.6 mg  1 Tab Oral DAILY   ??? polyethylene glycol (MIRALAX) packet 17 g  17 g Oral DAILY PRN   ??? bisacodyL (DULCOLAX) suppository 10 mg  10 mg Rectal DAILY PRN   ??? [Held by provider] hydroCHLOROthiazide (HYDRODIURIL) tablet 25 mg  25 mg Oral DAILY   ??? sodium chloride (NS) flush 5-40 mL  5-40 mL IntraVENous Q8H   ??? sodium chloride (NS) flush 5-40 mL  5-40 mL IntraVENous PRN   ??? promethazine (PHENERGAN) tablet 12.5 mg  12.5 mg Oral Q6H PRN    Or   ??? ondansetron (ZOFRAN) injection 4 mg  4 mg IntraVENous Q6H PRN   ??? glucose chewable tablet 16 g  4 Tab Oral PRN   ??? dextrose (D50W) injection syrg 12.5-25 g  25-50 mL IntraVENous PRN   ??? glucagon (GLUCAGEN) injection 1 mg  1 mg IntraMUSCular PRN   ??? insulin lispro (HUMALOG) injection   SubCUTAneous AC&HS   ??? aspirin delayed-release tablet 81 mg  81 mg Oral  DAILY   ??? pantoprazole (PROTONIX) tablet 40 mg  40 mg  Oral ACB   ??? enoxaparin (LOVENOX) injection 40 mg  40 mg SubCUTAneous Q24H     ______________________________________________________________________  EXPECTED LENGTH OF STAY: 4d 2h  ACTUAL LENGTH OF STAY:          [place], MD

## 2019-12-13 NOTE — Progress Notes (Signed)
Physical therapy    Chart reviewed up to date, Rn advised pt has fever of 100.4, defer PT today and will follow up as appropriate.    Professionally,     Wess Botts, PTA

## 2019-12-13 NOTE — Progress Notes (Signed)
Occupational Therapy Note:     Pt with temperature this date and discharge was canceled.  Nursing requesting to defer therapy at this time.  Will follow up as able.       Ocie Cornfield, OT  Time Calculation: 35 mins

## 2019-12-13 NOTE — Progress Notes (Signed)
Pharmacist Note - Vancomycin Dosing    Consult provided for this 69 y.o. female for indication of Osteomyelitis - first distal phalanx of L great toe; dry gangrene on L 2nd toe;   fevers for the past 2 days  - s/p L TMA on 3/18  - s/p L Fem-Tib bypass on 3/11    Antibiotic regimen(s): Vancomycin and Zosyn    Recent Labs     12/12/19  1620 12/12/19  0152 12/11/19  0530   WBC 7.5 7.1 7.6   CREA  --  0.90 0.84   BUN  --  12 11     Frequency of BMP: Daily x 3  Height: 165.1 cm  Weight: 76.7 kg  Est CrCl: 59.6 ml/min; UO: 0.54 ml/kg/hr (once occurrence)  Temp (24hrs), Avg:99.9 ??F (37.7 ??C), Min:99.1 ??F (37.3 ??C), Max:100.4 ??F (38 ??C)    Cultures:  3/06 Blood - NG, final  3/18 Tissue, distal phalanx bone - light pseudomonas aeruginosa (pan sensitive), final  3/20 Blood - NG, final  3/25 Blood - NGTD - pending    Goal trough = 15 - 20 mcg/mL    Recent trough history (date/time/level/dose/action taken):  3/09 @ 0449 = 12 mcg/mL (~12 hrs post-dose on 1000mg  q12h) - continued  3/11 @ 1646 = 18.4 mcg/ml, extrapolated trough = 17.26 mcg/ml [1000 mg q12h] dose decreased to 1000mg  q 16h  - 3/14 Vancomycin stopped. Reordered on 3/15 -  - Random 3/15 @ 1058 = 5.9 mcg/ml, extrapolated level ~19.7 mcg/mL. restart at 1000 mg q18h  3/17 @ 19:17 = 11.5 mcg/mL ~17 hrs post dose; extrapolated trough = 10 mcg/mL [1000 mg q18] dose increased  3/20 @ 0946 = 17.1 mcg/mL ~16.25 hrs post-dose; extrapolated trough = 17.3 mcg/mL; no change  - 3/22 Vancomycin stopped. Restarted on 3/26 -    Therapy will be initiated with a loading dose of 2000 mg IV x 1 to be followed by a maintenance dose of 1000 mg IV every 16 hours.  Pharmacy to follow patient daily and order levels / make dose adjustments as appropriate.    Kirke Corin, PharmD  Clinical Pharmacist, Orthopedics and Med/Surg  [place] [phone])

## 2019-12-13 NOTE — Progress Notes (Signed)
Vascular:    Discharge cancelled yesterday due to fever. Left leg incisions OK. I will be away after today - partners available if needed.

## 2019-12-13 NOTE — Progress Notes (Signed)
Ok per Dr. Fredonia Highland to give Amlodipine with BP of 105/67 and HR of 95. Oral temperature 100.3. Dr. Fredonia Highland aware.    1141 Oral temperature now 100.4. PerfectServe sent to Dr. Fredonia Highland. Per Dr. Fredonia Highland, cancel discharge and hold tylenol.

## 2019-12-13 NOTE — Progress Notes (Signed)
Bedside shift change report given to Peyton, RN (oncoming nurse) by Kayla Muscat,RN (offgoing nurse). Report included the following information SBAR, Kardex, Procedure Summary, Intake/Output, MAR and Recent Results.

## 2019-12-13 NOTE — Progress Notes (Signed)
Occupational Therapy Note:     Pt with temperature this date and discharge was canceled.  Nursing requesting to defer therapy at this time.  Will follow up as able.       Santa Lighter, OT  Time Calculation: 35 mins

## 2019-12-13 NOTE — Progress Notes (Signed)
TOC: Discharge on hold per MD. Josem Kaufmann has been approved and discharge pending to [facility] will be required again if patient not discharged to [facility] by Sunday 3/27. Barnett Applebaum at [facility] [phone]) will be on call all weekend. BLS/[facility] on will call. Updated the liaison Inez Catalina at [facility]. CM following for discharge needs.     RUR: 18%    Nathanial Millman RN/CRM

## 2019-12-13 NOTE — Progress Notes (Signed)
Progress  Notes by Cheryll Cockayne, MD at 12/13/19 1149                Author: Cheryll Cockayne, MD  Service: Hospitalist  Author Type: Physician       Filed: 12/13/19 1207  Date of Service: 12/13/19 1149  Status: Signed          Editor: Cheryll Cockayne, MD (Physician)                    [facility]                                                                                              Hospitalist Progress Note   Cheryll Cockayne, MD   Answering service: [phone] OR [phone] from in house phone            Date of Service:  12/13/2019   NAME:  [facility]   DOB:  January 16, 1951   MRN:  [id number]           Admission Summary:           "Lindsey Huynh??is a 69 y.o.??female??with past medical history of diabetes, insulin-dependent, GERD and hypertension comes for return of left foot pain and swelling.      Interval history / Subjective:          F/U for left foot cellulitis, left great toe gangrene   Patient is seen and examined at bedside this AM. Fever spike yesterday afternoon- felt warm, denied cough, sore throat, abd pain, urinary or bowel changes. 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 First distal phalanx osteomyelitis.   - completed cefepime. Appreciate ID input.    - surgical margin clear   - Vascular surgery following- Patient to follow up in 2 weeks post- op   - appreciate Podiatry input    - started on aspirin    ??   Fever 3/25 and 3/26   - normal wbc, lactic, procalcitonin   - UA clean. CXR: normal   - rapid covid negative. pcr ordered   - blood cx : NGTD   - surgical site - no drainage   - started on broad spectrum abx IV Vanco and zosyn   - ID reconsulted       Anemia    -  No signs  of bleeding noted. hgb low stable    - Monitor labs and transfuse if hgb <7.0??   ??   DMII:??hold metformin while IP   -Cont basal insulin, SSI   -Diabetic diet   -a1c 8.1   - DM education    ??   HTN   -Continue amlodipine. holding Cozaar and HCTZ due to soft BP    ??  GERD   -Cont PPI      Code status: Full    DVT prophylaxis: Lovenox       Care Plan discussed with: Patient/Family, Nurse and Case Manager   Anticipated Disposition: TBD   Anticipated Discharge: [facility] Problems   Date Reviewed:  05/31/2018                         Codes  Class  Noted  POA              Gangrene ([place])  ICD-10-CM: H63   ICD-9-CM: 785.4    11/23/2019  Unknown                               Review of Systems:     A comprehensive review of systems was negative except for that written in the HPI.       Xr Foot Lt Ap/lat      Result Date: 12/05/2019   1. Transmetatarsal amputation.      Xr Foot Lt Min 3 V      Result Date: 11/23/2019   No acute abnormality.      Mri Low Ext Lt Wo Cont      Result Date: 12/01/2019   1. First distal phalanx osteomyelitis. 2. No abscess.       Ct Low Ext Lt W Cont      Result Date: 11/24/2019   No CT imaging evidence for soft tissue gas, abscess or osteomyelitis.      Xr Chest Port      Result Date: 12/12/2019   No evidence of acute cardiopulmonary process.       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 Fluoro Dose:  144.09 mGy. vk        Vital Signs:      Last 24hrs VS reviewed since prior progress note. Most recent are:   Visit Vitals      BP  105/67 (BP 1 Location: Right upper arm, BP Patient Position: At rest)     Pulse  95     Temp  100.4 ??F (38 ??C)     Resp  19     Ht  5\' 5"  (1.651 m)     Wt  76.7 kg (169 lb)     SpO2  100%        BMI  28.12 kg/m??              Intake/Output Summary (Last 24 hours) at 12/13/2019 1149   Last data filed at 12/13/2019 1497     Gross per 24 hour        Intake  --        Output  500 ml        Net  -500 ml              Physical  Examination:                     Constitutional:   No acute distress, cooperative     ENT:   Oral mucosa moist, oropharynx benign.      Resp:   CTA bilaterally.     CV:   Regular rhythm, normal rate,  GI:   Soft, non distended, non tender.bs+         Musculoskeletal:   LLE dressing         Neurologic:   Moves all extremities.  AAOx3, CN II-XII reviewed, follows commands       Psych:  Good insight, Not anxious nor agitated.         ??                       Data Review:      Review and/or order of clinical lab test   Review and/or order of tests in the medicine section of CPT           Labs:          Recent Labs            12/12/19   1620  12/12/19   0152     WBC  7.5  7.1     HGB  7.6*  8.3*     HCT  22.3*  24.8*         PLT  353  393          Recent Labs            12/12/19   0152  12/11/19   0530     NA  134*  134*     K  3.9  4.3     CL  103  105     CO2  25  22     BUN  12  11     CREA  0.90  0.84     GLU  103*  115*         CA  9.2  9.0        No results for input(s): ALT, AP, TBIL, TBILI, TP, ALB, GLOB, GGT, AML, LPSE in the last 72 hours.      No lab exists for component: SGOT, GPT, AMYP, HLPSE   No results for input(s): INR, PTP, APTT, INREXT, INREXT in the last 72 hours.    No results for input(s): FE, TIBC, PSAT, FERR in the last 72 hours.    No results found for: FOL, RBCF    No results for input(s): PH, PCO2, PO2 in the last 72 hours.   No results for input(s): CPK, CKNDX, TROIQ in the last 72 hours.      No lab exists for component: CPKMB     Lab Results         Component  Value  Date/Time            Cholesterol, total  219 (H)  10/30/2018 10:27 AM       HDL Cholesterol  63  10/30/2018 10:27 AM       LDL, calculated  130 (H)  10/30/2018 10:27 AM       Triglyceride  131  10/30/2018 10:27 AM            CHOL/HDL Ratio  3.4  12/26/2007 09:12 AM          Lab Results         Component  Value  Date/Time            Glucose (POC)  130 (H)  12/13/2019 10:59 AM       Glucose (POC)  89  12/13/2019 06:12 AM        Glucose (POC)  109 (H)  12/12/2019 09:37 PM  Glucose (POC)  156 (H)  12/12/2019 04:41 PM            Glucose (POC)  149 (H)  12/12/2019 11:26 AM          Lab Results         Component  Value  Date/Time            Color  YELLOW/STRAW  12/13/2019 06:24 AM       Appearance  CLEAR  12/13/2019 06:24 AM       Specific gravity  1.022  12/13/2019 06:24 AM       pH (UA)  5.5  12/13/2019 06:24 AM       Protein  100 (A)  12/13/2019 06:24 AM       Glucose  Negative  12/13/2019 06:24 AM       Ketone  Negative  12/13/2019 06:24 AM       Bilirubin  Negative  12/13/2019 06:24 AM       Urobilinogen  0.2  12/13/2019 06:24 AM       Nitrites  Negative  12/13/2019 06:24 AM       Leukocyte Esterase  Negative  12/13/2019 06:24 AM       Epithelial cells  FEW  12/13/2019 06:24 AM       Bacteria  Negative  12/13/2019 06:24 AM       WBC  0-4  12/13/2019 06:24 AM            RBC  0-5  12/13/2019 06:24 AM                Medications Reviewed:          Current Facility-Administered Medications          Medication  Dose  Route  Frequency           ?  acetaminophen (TYLENOL) suppository 650 mg   650 mg  Rectal  Q6H PRN     ?  amLODIPine (NORVASC) tablet 5 mg   5 mg  Oral  DAILY     ?  [Held by provider] losartan (COZAAR) tablet 50 mg   50 mg  Oral  DAILY           ?  lactulose (CHRONULAC) 10 gram/15 mL solution 30 mL   20 g  Oral  BID           ?  alteplase (CATHFLO) 1 mg in sterile water (preservative free) 1 mL injection   1 mg  InterCATHeter  PRN     ?  insulin glargine (LANTUS) injection 12 Units   12 Units  SubCUTAneous  QHS     ?  acetaminophen (TYLENOL) tablet 650 mg   650 mg  Oral  Q6H     ?  gabapentin (NEURONTIN) capsule 300 mg   300 mg  Oral  QHS     ?  gabapentin (NEURONTIN) capsule 100 mg   100 mg  Oral  DAILY     ?  sodium chloride (NS) flush 5-40 mL   5-40 mL  IntraVENous  Q8H     ?  sodium chloride (NS) flush 5-40 mL   5-40 mL  IntraVENous  PRN     ?  sodium phosphate (FLEET'S) enema 1 Enema   1 Enema  Rectal  PRN     ?   oxyCODONE IR (ROXICODONE) tablet 5 mg   5 mg  Oral  Q4H PRN     ?  docusate sodium (COLACE) capsule 100 mg   100 mg  Oral  BID     ?  senna (SENOKOT) tablet 8.6 mg   1 Tab  Oral  DAILY     ?  polyethylene glycol (MIRALAX) packet 17 g   17 g  Oral  DAILY PRN     ?  bisacodyL (DULCOLAX) suppository 10 mg   10 mg  Rectal  DAILY PRN     ?  [Held by provider] hydroCHLOROthiazide (HYDRODIURIL) tablet 25 mg   25 mg  Oral  DAILY     ?  sodium chloride (NS) flush 5-40 mL   5-40 mL  IntraVENous  Q8H     ?  sodium chloride (NS) flush 5-40 mL   5-40 mL  IntraVENous  PRN     ?  promethazine (PHENERGAN) tablet 12.5 mg   12.5 mg  Oral  Q6H PRN          Or           ?  ondansetron (ZOFRAN) injection 4 mg   4 mg  IntraVENous  Q6H PRN     ?  glucose chewable tablet 16 g   4 Tab  Oral  PRN           ?  dextrose (D50W) injection syrg 12.5-25 g   25-50 mL  IntraVENous  PRN           ?  glucagon (GLUCAGEN) injection 1 mg   1 mg  IntraMUSCular  PRN     ?  insulin lispro (HUMALOG) injection     SubCUTAneous  AC&HS     ?  aspirin delayed-release tablet 81 mg   81 mg  Oral  DAILY     ?  pantoprazole (PROTONIX) tablet 40 mg   40 mg  Oral  ACB           ?  enoxaparin (LOVENOX) injection 40 mg   40 mg  SubCUTAneous  Q24H        ______________________________________________________________________   EXPECTED LENGTH OF STAY: 4d 2h   ACTUAL LENGTH OF STAY:          [place], MD

## 2019-12-13 NOTE — Progress Notes (Signed)
 Pharmacist Note - Vancomycin Dosing    Consult provided for this 69 y.o. female for indication of Osteomyelitis - first distal phalanx of L great toe; dry gangrene on L 2nd toe;   fevers for the past 2 days  - s/p L TMA on 3/18  - s/p L Fem-Tib bypass on 3/11    Antibiotic regimen(s): Vancomycin and Zosyn    Recent Labs     12/12/19  1620 12/12/19  0152 12/11/19  0530   WBC 7.5 7.1 7.6   CREA  --  0.90 0.84   BUN  --  12 11     Frequency of BMP: Daily x 3  Height: 165.1 cm  Weight: 76.7 kg  Est CrCl: 59.6 ml/min; UO: 0.54 ml/kg/hr (once occurrence)  Temp (24hrs), Avg:99.9 F (37.7 C), Min:99.1 F (37.3 C), Max:100.4 F (38 C)    Cultures:  3/06 Blood - NG, final  3/18 Tissue, distal phalanx bone - light pseudomonas aeruginosa (pan sensitive), final  3/20 Blood - NG, final  3/25 Blood - NGTD - pending    Goal trough = 15 - 20 mcg/mL    Recent trough history (date/time/level/dose/action taken):  3/09 @ 0449 = 12 mcg/mL (~12 hrs post-dose on 1000mg  q12h) - continued  3/11 @ 1646 = 18.4 mcg/ml, extrapolated trough = 17.26 mcg/ml [1000 mg q12h] dose decreased to 1000mg  q 16h  - 3/14 Vancomycin stopped. Reordered on 3/15 -  - Random 3/15 @ 1058 = 5.9 mcg/ml, extrapolated level ~19.7 mcg/mL. restart at 1000 mg q18h  3/17 @ 19:17 = 11.5 mcg/mL ~17 hrs post dose; extrapolated trough = 10 mcg/mL [1000 mg q18] dose increased  3/20 @ 0946 = 17.1 mcg/mL ~16.25 hrs post-dose; extrapolated trough = 17.3 mcg/mL; no change  - 3/22 Vancomycin stopped. Restarted on 3/26 -    Therapy will be initiated with a loading dose of 2000 mg IV x 1 to be followed by a maintenance dose of 1000 mg IV every 16 hours.  Pharmacy to follow patient daily and order levels / make dose adjustments as appropriate.    Prentice Bray, PharmD  Clinical Pharmacist, Orthopedics and Med/Surg  [facility] [phone])

## 2019-12-13 NOTE — Progress Notes (Signed)
Bedside and Verbal shift change report given to Alyse Low, Therapist, sports (Soil scientist) by Ria Clock, RN (offgoing nurse). Report included the following information SBAR, Procedure Summary, MAR and Recent Results.

## 2019-12-13 NOTE — Progress Notes (Signed)
Patient temperature 103. Dr. Fredonia Highland paged.    1827 Give tylenol per Dr. Fredonia Highland

## 2019-12-13 NOTE — Progress Notes (Signed)
 This RN attempted to remove patient's staples. Patient requested staples be removed another time. Patient states she wants to rest.    1711 This RN attempted again to remove patient's staples. Patient refused.

## 2019-12-13 NOTE — Progress Notes (Addendum)
Ok per Dr. Fredonia Highland to give Amlodipine with BP of 105/67 and HR of 95. Oral temperature 100.3. Dr. Fredonia Highland aware.    1141 Oral temperature now 100.4. PerfectServe sent to Dr. Fredonia Highland. Per Dr. Fredonia Highland, cancel discharge and hold tylenol.

## 2019-12-13 NOTE — Progress Notes (Addendum)
TOC: Discharge on hold per MD. Josem Kaufmann has been approved and discharge pending to [place] will be required again if patient not discharged to [facility] by Sunday 3/27. Barnett Applebaum at [facility] [phone]) will be on call all weekend. BLS/[facility] on will call. Updated the liaison Inez Catalina at [facility]. CM following for discharge needs.     RUR: 18%    Nathanial Millman RN/CRM

## 2019-12-14 LAB — METABOLIC PANEL, BASIC
Anion gap: 9 mmol/L (ref 5–15)
BUN/Creatinine ratio: 12 (ref 12–20)
BUN: 12 MG/DL (ref 6–20)
CO2: 20 mmol/L — ABNORMAL LOW (ref 21–32)
Calcium: 8.3 MG/DL — ABNORMAL LOW (ref 8.5–10.1)
Chloride: 104 mmol/L (ref 97–108)
Creatinine: 1.04 MG/DL — ABNORMAL HIGH (ref 0.55–1.02)
GFR est AA: >60 mL/min/{1.73_m2} (ref >60)
GFR est non-AA: 52 mL/min/{1.73_m2} — ABNORMAL LOW (ref >60)
Glucose: 145 mg/dL — ABNORMAL HIGH (ref 65–100)
Potassium: 3.9 mmol/L (ref 3.5–5.1)
Sodium: 133 mmol/L — ABNORMAL LOW (ref 136–145)

## 2019-12-14 LAB — CBC WITH AUTOMATED DIFF
ABS. BASOPHILS: 0.1 10*3/uL (ref 0.0–0.1)
ABS. EOSINOPHILS: 0.7 10*3/uL — ABNORMAL HIGH (ref 0.0–0.4)
ABS. IMM. GRANS.: 0.1 10*3/uL — ABNORMAL HIGH (ref 0.00–0.04)
ABS. LYMPHOCYTES: 0.7 10*3/uL — ABNORMAL LOW (ref 0.8–3.5)
ABS. MONOCYTES: 0.1 10*3/uL (ref 0.0–1.0)
ABS. NEUTROPHILS: 12.6 10*3/uL — ABNORMAL HIGH (ref 1.8–8.0)
ABSOLUTE NRBC: 0 10*3/uL (ref 0.00–0.01)
BASOPHILS: 1 % (ref 0–1)
EOSINOPHILS: 5 % (ref 0–7)
HCT: 24.2 % — ABNORMAL LOW (ref 35.0–47.0)
HGB: 8.2 g/dL — ABNORMAL LOW (ref 11.5–16.0)
IMMATURE GRANULOCYTES: 1 % — ABNORMAL HIGH (ref 0.0–0.5)
LYMPHOCYTES: 5 % — ABNORMAL LOW (ref 12–49)
MCH: 30.5 PG (ref 26.0–34.0)
MCHC: 33.9 g/dL (ref 30.0–36.5)
MCV: 90 FL (ref 80.0–99.0)
MONOCYTES: 1 % — ABNORMAL LOW (ref 5–13)
MPV: 10.1 FL (ref 8.9–12.9)
NEUTROPHILS: 87 % — ABNORMAL HIGH (ref 32–75)
NRBC: 0 PER 100 WBC
PLATELET: 338 10*3/uL (ref 150–400)
RBC: 2.69 M/uL — ABNORMAL LOW (ref 3.80–5.20)
RDW: 14.1 % (ref 11.5–14.5)
WBC: 14.3 10*3/uL — ABNORMAL HIGH (ref 3.6–11.0)

## 2019-12-14 LAB — GLUCOSE, POC
Glucose (POC): 188 mg/dL — ABNORMAL HIGH (ref 65–100)
Glucose (POC): 136 mg/dL — ABNORMAL HIGH (ref 65–100)
Glucose (POC): 186 mg/dL — ABNORMAL HIGH (ref 65–100)
Glucose (POC): 144 mg/dL — ABNORMAL HIGH (ref 65–100)

## 2019-12-14 LAB — NOVEL CORONAVIRUS (COVID-19): SARS-CoV-2: NOT DETECTED

## 2019-12-14 LAB — CBC WITH AUTO DIFFERENTIAL
Basophils %: 1 % (ref 0–1)
Basophils Absolute: 0.1 10*3/uL (ref 0.0–0.1)
Eosinophils %: 5 % (ref 0–7)
Eosinophils Absolute: 0.7 10*3/uL — ABNORMAL HIGH (ref 0.0–0.4)
Granulocyte Absolute Count: 0.1 10*3/uL — ABNORMAL HIGH (ref 0.00–0.04)
Hematocrit: 24.2 % — ABNORMAL LOW (ref 35.0–47.0)
Hemoglobin: 8.2 g/dL — ABNORMAL LOW (ref 11.5–16.0)
Immature Granulocytes %: 1 % — ABNORMAL HIGH (ref 0.0–0.5)
Lymphocytes %: 5 % — ABNORMAL LOW (ref 12–49)
Lymphocytes Absolute: 0.7 10*3/uL — ABNORMAL LOW (ref 0.8–3.5)
MCH: 30.5 PG (ref 26.0–34.0)
MCHC: 33.9 g/dL (ref 30.0–36.5)
MCV: 90 FL (ref 80.0–99.0)
MPV: 10.1 FL (ref 8.9–12.9)
Monocytes %: 1 % — ABNORMAL LOW (ref 5–13)
Monocytes Absolute: 0.1 10*3/uL (ref 0.0–1.0)
NRBC Absolute: 0 10*3/uL (ref 0.00–0.01)
Neutrophils %: 87 % — ABNORMAL HIGH (ref 32–75)
Neutrophils Absolute: 12.6 10*3/uL — ABNORMAL HIGH (ref 1.8–8.0)
Nucleated RBCs: 0 PER 100 WBC
Platelets: 338 10*3/uL (ref 150–400)
RBC: 2.69 M/uL — ABNORMAL LOW (ref 3.80–5.20)
RDW: 14.1 % (ref 11.5–14.5)
WBC: 14.3 10*3/uL — ABNORMAL HIGH (ref 3.6–11.0)

## 2019-12-14 LAB — BASIC METABOLIC PANEL
Anion Gap: 9 mmol/L (ref 5–15)
BUN/Creatinine Ratio: 12 (ref 12–20)
BUN: 12 MG/DL (ref 6–20)
CO2: 20 mmol/L — ABNORMAL LOW (ref 21–32)
Calcium: 8.3 MG/DL — ABNORMAL LOW (ref 8.5–10.1)
Chloride: 104 mmol/L (ref 97–108)
Creatinine: 1.04 MG/DL — ABNORMAL HIGH (ref 0.55–1.02)
GFR African American: >60 mL/min/{1.73_m2} (ref >60)
Glucose: 145 mg/dL — ABNORMAL HIGH (ref 65–100)
Potassium: 3.9 mmol/L (ref 3.5–5.1)
Sodium: 133 mmol/L — ABNORMAL LOW (ref 136–145)
eGFR NON-AA: 52 mL/min/{1.73_m2} — ABNORMAL LOW (ref >60)

## 2019-12-14 LAB — POCT GLUCOSE
POC Glucose: 188 mg/dL — ABNORMAL HIGH (ref 65–100)
POC Glucose: 136 mg/dL — ABNORMAL HIGH (ref 65–100)
POC Glucose: 186 mg/dL — ABNORMAL HIGH (ref 65–100)
POC Glucose: 144 mg/dL — ABNORMAL HIGH (ref 65–100)

## 2019-12-14 LAB — COVID-19: SARS-CoV-2: NOT DETECTED

## 2019-12-14 MED FILL — ACETAMINOPHEN 325 MG TABLET: 325 mg | ORAL | Qty: 2

## 2019-12-14 MED FILL — DOK 100 MG CAPSULE: 100 mg | ORAL | Qty: 1

## 2019-12-14 MED FILL — ENOXAPARIN 40 MG/0.4 ML SUB-Q SYRINGE: 40 mg/0.4 mL | SUBCUTANEOUS | Qty: 0.4

## 2019-12-14 MED FILL — ASPIRIN 81 MG TAB, DELAYED RELEASE: 81 mg | ORAL | Qty: 1

## 2019-12-14 MED FILL — LACTULOSE 20 GRAM/30 ML ORAL SOLUTION: 20 gram/30 mL | ORAL | Qty: 30

## 2019-12-14 MED FILL — OXYCODONE 5 MG TAB: 5 mg | ORAL | Qty: 1

## 2019-12-14 MED FILL — GABAPENTIN 300 MG CAP: 300 mg | ORAL | Qty: 1

## 2019-12-14 MED FILL — PANTOPRAZOLE 40 MG TAB, DELAYED RELEASE: 40 mg | ORAL | Qty: 1

## 2019-12-14 MED FILL — INSULIN LISPRO 100 UNIT/ML INJECTION: 100 unit/mL | SUBCUTANEOUS | Qty: 1

## 2019-12-14 MED FILL — SENNA LAX 8.6 MG TABLET: 8.6 mg | ORAL | Qty: 1

## 2019-12-14 MED FILL — INSULIN GLARGINE 100 UNIT/ML INJECTION: 100 unit/mL | SUBCUTANEOUS | Qty: 1

## 2019-12-14 MED FILL — PIPERACILLIN-TAZOBACTAM 3.375 GRAM IV SOLR: 3.375 gram | INTRAVENOUS | Qty: 3.38

## 2019-12-14 MED FILL — VANCOMYCIN 1,000 MG IV SOLR: 1000 mg | INTRAVENOUS | Qty: 1000

## 2019-12-14 MED FILL — AMLODIPINE 5 MG TAB: 5 mg | ORAL | Qty: 1

## 2019-12-14 MED FILL — GABAPENTIN 100 MG CAP: 100 mg | ORAL | Qty: 1

## 2019-12-14 NOTE — Other (Signed)
Nurse Quin Hoop gave bedside report to oncoming nurse Propel RN by SBAR and Kardex

## 2019-12-14 NOTE — Progress Notes (Signed)
ID Progress Note  12/14/2019       Left tma 3/18    Subjective:       Afebrile. No dyspnea, abdominal pain, headache or sore throat    ROS: No anaphylaxis, seizures, syncope, hematemesis, hematochezia     Objective:     Vitals:   Visit Vitals  BP (!) 89/57 (BP 1 Location: Left upper arm, BP Patient Position: At rest)   Pulse 96   Temp 99.5 ??F (37.5 ??C)   Resp 17   Ht 5' 5"  (1.651 m)   Wt 78.8 kg (173 lb 12.8 oz)   SpO2 96%   BMI 28.92 kg/m??        Tmax:  Temp (24hrs), Avg:100.2 ??F (37.9 ??C), Min:98.6 ??F (37 ??C), Max:103 ??F (39.4 ??C)      Exam:    Not in distress  Pink conjunctivae, anicteric sclerae  No cervical lymphdenopathy   Lung clear, no rales, wheezes or rhonchi   Heart: s1, s2, RRR, no murmurs rubs or clicks  Abdomen: soft nontender, no guarding or rebound  Knees not warm or tender  Left foot anterior leg bandaged.     Labs:   Lab Results   Component Value Date/Time    WBC 14.3 (H) 12/14/2019 03:22 AM    HGB 8.2 (L) 12/14/2019 03:22 AM    HCT 24.2 (L) 12/14/2019 03:22 AM    PLATELET 338 12/14/2019 03:22 AM    MCV 90.0 12/14/2019 03:22 AM     Lab Results   Component Value Date/Time    Sodium 133 (L) 12/14/2019 03:22 AM    Potassium 3.9 12/14/2019 03:22 AM    Chloride 104 12/14/2019 03:22 AM    CO2 20 (L) 12/14/2019 03:22 AM    Anion gap 9 12/14/2019 03:22 AM    Glucose 145 (H) 12/14/2019 03:22 AM    BUN 12 12/14/2019 03:22 AM    Creatinine 1.04 (H) 12/14/2019 03:22 AM    BUN/Creatinine ratio 12 12/14/2019 03:22 AM    GFR est AA >60 12/14/2019 03:22 AM    GFR est non-AA 52 (L) 12/14/2019 03:22 AM    Calcium 8.3 (L) 12/14/2019 03:22 AM    Bilirubin, total 0.2 11/23/2019 01:32 PM    Alk. 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   #2 hyperlipidemia  ??  #3 hypertension  ??  #4 diabetes  ??  #5 new onset fever    Recommendations:       Had TMA 3/18. Pathology of proximal margin shows no osteo.     She has new onset fever. I agree with vancomycin and Zosyn. Follow-up cultures    Team available tomorrow if needed     Blair Hailey, MD

## 2019-12-14 NOTE — Progress Notes (Signed)
[facility]                                                                                          Hospitalist Progress Note  Cheryll Cockayne, MD  Answering service: [phone] OR [phone] from in house phone        Date of Service:  12/14/2019  NAME:  Lindsey Huynh  DOB:  [birth year]  MRN:  [id number]      Admission Summary:     "Shatari Dinneen??is a 69 y.o.??female??with past medical history of diabetes, insulin-dependent, GERD and hypertension comes for return of left foot pain and swelling.    Interval history / Subjective:     F/U for left foot cellulitis, left great toe gangrene  Patient is seen and examined at bedside this AM. Feels better today. No new complaints.   Discussed with nursing      Assessment & Plan:     Left great toe Osteomyelitis s/p TMA 3/18  PAD: Status post femoropopliteal bypass on November 28, 2019  LLE pain:??dry gangrene??of??left 2nd toe cellulitic changes/Left popliteal occlusion  ??- MRI LLE:??1. First distal phalanx osteomyelitis.  - completed cefepime. Appreciate ID input.   - surgical margin clear  - Vascular surgery following- Patient to follow up in 2 weeks post- op  - appreciate Podiatry input   - started on aspirin   ??  Fever 3/25 and 3/26  - normal wbc, lactic, procalcitonin  - UA clean. CXR: normal  - rapid covid negative. pcr ordered  - blood cx : NGTD  - surgical site - no drainage  - appreciate ID input  - leucocytosis on 3/27   - started on broad spectrum abx IV Vanco and zosyn    Anemia   -  No signs of bleeding noted. hgb low stable   - Monitor labs and transfuse if hgb <7.0??  ??  DMII:??hold metformin while IP  -Cont basal insulin, SSI  -Diabetic diet  -a1c 8.1  - DM education   ??  HTN  -Continue amlodipine. holding Cozaar and HCTZ due to soft BP   ??  GERD  -Cont PPI    Code status: Full   DVT prophylaxis: Lovenox     Care Plan discussed with: Patient/Family, Nurse and Case Manager  Anticipated Disposition: TBD  Anticipated Discharge: [facility] Problems  Date Reviewed: 06-16-2018          Codes Class Noted POA    Gangrene ([place]) ICD-10-CM: [id number]  ICD-9-CM: 785.4  11/23/2019 Unknown                Review of Systems:   A comprehensive review of systems was negative except for that written in the HPI.     Xr Foot Lt Ap/lat    Result Date: 12/05/2019  1. Transmetatarsal amputation.    Xr Foot Lt Min 3 V    Result Date: 11/23/2019  No acute abnormality.    Mri Low Ext Lt Wo Cont    Result Date: 12/01/2019  1. First distal phalanx osteomyelitis. 2. No abscess.  Ct Low Ext Lt W Cont    Result Date: 11/24/2019  No CT imaging evidence for soft tissue gas, abscess or osteomyelitis.    Xr Chest Port    Result Date: 12/12/2019  No evidence of acute cardiopulmonary process.     Retail banker Technologist Service    Result Date: 11/25/2019  FLUOROSCOPY WAS USED. Fluoro Dose:  144.09 mGy. vk    Vital Signs:    Last 24hrs VS reviewed since prior progress note. Most recent are:  Visit Vitals  BP 134/81 (BP 1 Location: Left upper arm, BP Patient Position: At rest;Supine)   Pulse 98   Temp 100 ??F (37.8 ??C)   Resp 17   Ht 5\' 5"  (1.651 m)   Wt 78.8 kg (173 lb 12.8 oz)   SpO2 99%   BMI 28.92 kg/m??       No intake or output data in the 24 hours ending 12/14/19 1408     Physical Examination:             Constitutional:  No acute distress, cooperative   ENT:  Oral mucosa moist, oropharynx benign.    Resp:  CTA bilaterally.   CV:  Regular rhythm, normal rate,     GI:  Soft, non distended, non tender.bs+    Musculoskeletal:  LLE dressing    Neurologic:  Moves all extremities.  AAOx3, CN II-XII reviewed, follows commands     Psych:  Good insight, Not anxious nor agitated.     ??            Data Review:    Review and/or order of clinical lab test  Review and/or order of tests in the medicine section of CPT      Labs:     Recent Labs     12/14/19  0322 12/12/19  1620   WBC 14.3* 7.5   HGB 8.2* 7.6*   HCT 24.2* 22.3*   PLT 338 353     Recent Labs     12/14/19  0322 12/12/19  0152   NA  133* 134*   K 3.9 3.9   CL 104 103   CO2 20* 25   BUN 12 12   CREA 1.04* 0.90   GLU 145* 103*   CA 8.3* 9.2     No results for input(s): ALT, AP, TBIL, TBILI, TP, ALB, GLOB, GGT, AML, LPSE in the last 72 hours.    No lab exists for component: SGOT, GPT, AMYP, HLPSE  No results for input(s): INR, PTP, APTT, INREXT, INREXT in the last 72 hours.   No results for input(s): FE, TIBC, PSAT, FERR in the last 72 hours.   No results found for: FOL, RBCF   No results for input(s): PH, PCO2, PO2 in the last 72 hours.  No results for input(s): CPK, CKNDX, TROIQ in the last 72 hours.    No lab exists for component: CPKMB  Lab Results   Component Value Date/Time    Cholesterol, total 219 (H) 10/30/2018 10:27 AM    HDL Cholesterol 63 10/30/2018 10:27 AM    LDL, calculated 130 (H) 10/30/2018 10:27 AM    Triglyceride 131 10/30/2018 10:27 AM    CHOL/HDL Ratio 3.4 12/26/2007 09:12 AM     Lab Results   Component Value Date/Time    Glucose (POC) 186 (H) 12/14/2019 11:24 AM    Glucose (POC) 136 (H) 12/14/2019 06:40 AM    Glucose (POC) 188 (H) 12/13/2019 09:03 PM  Glucose (POC) 129 (H) 12/13/2019 04:21 PM    Glucose (POC) 130 (H) 12/13/2019 10:59 AM     Lab Results   Component Value Date/Time    Color YELLOW/STRAW 12/13/2019 06:24 AM    Appearance CLEAR 12/13/2019 06:24 AM    Specific gravity 1.022 12/13/2019 06:24 AM    pH (UA) 5.5 12/13/2019 06:24 AM    Protein 100 (A) 12/13/2019 06:24 AM    Glucose Negative 12/13/2019 06:24 AM    Ketone Negative 12/13/2019 06:24 AM    Bilirubin Negative 12/13/2019 06:24 AM    Urobilinogen 0.2 12/13/2019 06:24 AM    Nitrites Negative 12/13/2019 06:24 AM    Leukocyte Esterase Negative 12/13/2019 06:24 AM    Epithelial cells FEW 12/13/2019 06:24 AM    Bacteria Negative 12/13/2019 06:24 AM    WBC 0-4 12/13/2019 06:24 AM    RBC 0-5 12/13/2019 06:24 AM         Medications Reviewed:     Current Facility-Administered Medications   Medication Dose Route Frequency   ??? acetaminophen (TYLENOL) suppository 650 mg   650 mg Rectal Q6H PRN   ??? piperacillin-tazobactam (ZOSYN) 3.375 g in 0.9% sodium chloride (MBP/ADV) 100 mL MBP  3.375 g IntraVENous Q8H   ??? Vancomycin - Pharmacy to Dose   Other Rx Dosing/Monitoring   ??? vancomycin (VANCOCIN) 1,000 mg in 0.9% sodium chloride 250 mL (VIAL-MATE)  1,000 mg IntraVENous Q16H   ??? amLODIPine (NORVASC) tablet 5 mg  5 mg Oral DAILY   ??? [Held by provider] losartan (COZAAR) tablet 50 mg  50 mg Oral DAILY   ??? lactulose (CHRONULAC) 10 gram/15 mL solution 30 mL  20 g Oral BID   ??? alteplase (CATHFLO) 1 mg in sterile water (preservative free) 1 mL injection  1 mg InterCATHeter PRN   ??? insulin glargine (LANTUS) injection 12 Units  12 Units SubCUTAneous QHS   ??? acetaminophen (TYLENOL) tablet 650 mg  650 mg Oral Q6H   ??? gabapentin (NEURONTIN) capsule 300 mg  300 mg Oral QHS   ??? gabapentin (NEURONTIN) capsule 100 mg  100 mg Oral DAILY   ??? sodium chloride (NS) flush 5-40 mL  5-40 mL IntraVENous Q8H   ??? sodium chloride (NS) flush 5-40 mL  5-40 mL IntraVENous PRN   ??? sodium phosphate (FLEET'S) enema 1 Enema  1 Enema Rectal PRN   ??? oxyCODONE IR (ROXICODONE) tablet 5 mg  5 mg Oral Q4H PRN   ??? docusate sodium (COLACE) capsule 100 mg  100 mg Oral BID   ??? senna (SENOKOT) tablet 8.6 mg  1 Tab Oral DAILY   ??? polyethylene glycol (MIRALAX) packet 17 g  17 g Oral DAILY PRN   ??? bisacodyL (DULCOLAX) suppository 10 mg  10 mg Rectal DAILY PRN   ??? [Held by provider] hydroCHLOROthiazide (HYDRODIURIL) tablet 25 mg  25 mg Oral DAILY   ??? sodium chloride (NS) flush 5-40 mL  5-40 mL IntraVENous Q8H   ??? sodium chloride (NS) flush 5-40 mL  5-40 mL IntraVENous PRN   ??? promethazine (PHENERGAN) tablet 12.5 mg  12.5 mg Oral Q6H PRN    Or   ??? ondansetron (ZOFRAN) injection 4 mg  4 mg IntraVENous Q6H PRN   ??? glucose chewable tablet 16 g  4 Tab Oral PRN   ??? dextrose (D50W) injection syrg 12.5-25 g  25-50 mL IntraVENous PRN   ??? glucagon (GLUCAGEN) injection 1 mg  1 mg IntraMUSCular PRN   ??? insulin lispro (HUMALOG) injection  SubCUTAneous AC&HS   ??? aspirin delayed-release tablet 81 mg  81 mg Oral DAILY   ??? pantoprazole (PROTONIX) tablet 40 mg  40 mg Oral ACB   ??? enoxaparin (LOVENOX) injection 40 mg  40 mg SubCUTAneous Q24H     ______________________________________________________________________  EXPECTED LENGTH OF STAY: 7d 0h  ACTUAL LENGTH OF STAY:          [place], MD

## 2019-12-14 NOTE — Progress Notes (Signed)
Progress  Notes by Cheryll Cockayne, MD at 12/14/19 1408                Author: Cheryll Cockayne, MD  Service: Hospitalist  Author Type: Physician       Filed: 12/14/19 1518  Date of Service: 12/14/19 1408  Status: Signed          Editor: Cheryll Cockayne, MD (Physician)                    [facility]                                                                                              Hospitalist Progress Note   Cheryll Cockayne, MD   Answering service: [phone] OR [phone] from in house phone            Date of Service:  12/14/2019   NAME:  Lindsey Huynh   DOB:  [birth year]   MRN:  [id number]           Admission Summary:           "Lindsey Huynh??is a 69 y.o.??female??with past medical history of diabetes, insulin-dependent, GERD and hypertension comes for return of left foot pain and swelling.      Interval history / Subjective:          F/U for left foot cellulitis, left great toe gangrene   Patient is seen and examined at bedside this AM. Feels better today. No new complaints.    Discussed with nursing           Assessment & Plan:          Left great toe Osteomyelitis s/p TMA 3/18   PAD: Status post femoropopliteal bypass on November 28, 2019   LLE pain:??dry gangrene??of??left 2nd toe cellulitic changes/Left popliteal occlusion   ??- MRI LLE:??1. First distal phalanx osteomyelitis.   - completed cefepime. Appreciate ID input.    - surgical margin clear   - Vascular surgery following- Patient to follow up in 2 weeks post- op   - appreciate Podiatry input    - started on aspirin    ??   Fever 3/25 and 3/26   - normal wbc, lactic, procalcitonin   - UA clean. CXR: normal   - rapid covid negative. pcr ordered   - blood cx : NGTD   - surgical site - no drainage   - appreciate ID input   - leucocytosis on 3/27    - started on broad spectrum abx IV Vanco and zosyn      Anemia    -  No signs of bleeding noted. hgb low stable    - Monitor labs and transfuse if hgb <7.0??   ??   DMII:??hold  metformin while IP   -Cont basal insulin, SSI   -Diabetic diet   -a1c 8.1   - DM education    ??   HTN   -Continue amlodipine. holding Cozaar and HCTZ due to soft BP    ??   GERD   -Cont PPI  Code status: Full    DVT prophylaxis: Lovenox       Care Plan discussed with: Patient/Family, Nurse and Case Manager   Anticipated Disposition: TBD   Anticipated Discharge: [facility] Problems   Date Reviewed:  05/31/2018                         Codes  Class  Noted  POA              Gangrene ([place])  ICD-10-CM: [id number]   ICD-9-CM: 785.4    11/23/2019  Unknown                               Review of Systems:     A comprehensive review of systems was negative except for that written in the HPI.       Xr Foot Lt Ap/lat      Result Date: 12/05/2019   1. Transmetatarsal amputation.      Xr Foot Lt Min 3 V      Result Date: 11/23/2019   No acute abnormality.      Mri Low Ext Lt Wo Cont      Result Date: 12/01/2019   1. First distal phalanx osteomyelitis. 2. No abscess.       Ct Low Ext Lt W Cont      Result Date: 11/24/2019   No CT imaging evidence for soft tissue gas, abscess or osteomyelitis.      Xr Chest Port      Result Date: 12/12/2019   No evidence of acute cardiopulmonary process.       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 Fluoro Dose:  144.09 mGy. vk        Vital Signs:      Last 24hrs VS reviewed since prior progress note. Most recent are:   Visit Vitals      BP  134/81 (BP 1 Location: Left upper arm, BP Patient Position: At rest;Supine)     Pulse  98     Temp  100 ??F (37.8 ??C)     Resp  17     Ht  5\' 5"  (1.651 m)     Wt  78.8 kg (173 lb 12.8 oz)     SpO2  99%        BMI  28.92 kg/m??           No intake or output data in the 24 hours ending 12/14/19 1408         Physical Examination:                     Constitutional:   No acute distress, cooperative     ENT:   Oral mucosa moist, oropharynx benign.      Resp:   CTA bilaterally.     CV:   Regular rhythm, normal rate,           GI:   Soft, non distended, non tender.bs+         Musculoskeletal:   LLE dressing         Neurologic:   Moves all extremities.  AAOx3, CN II-XII reviewed, follows commands       Psych:  Good insight, Not anxious nor agitated.         ??  Data Review:      Review and/or order of clinical lab test   Review and/or order of tests in the medicine section of CPT           Labs:          Recent Labs            12/14/19   0322  12/12/19   1620     WBC  14.3*  7.5     HGB  8.2*  7.6*     HCT  24.2*  22.3*         PLT  338  353          Recent Labs            12/14/19   0322  12/12/19   0152     NA  133*  134*     K  3.9  3.9     CL  104  103     CO2  20*  25     BUN  12  12     CREA  1.04*  0.90     GLU  145*  103*         CA  8.3*  9.2        No results for input(s): ALT, AP, TBIL, TBILI, TP, ALB, GLOB, GGT, AML, LPSE in the last 72 hours.      No lab exists for component: SGOT, GPT, AMYP, HLPSE   No results for input(s): INR, PTP, APTT, INREXT, INREXT in the last 72 hours.    No results for input(s): FE, TIBC, PSAT, FERR in the last 72 hours.    No results found for: FOL, RBCF    No results for input(s): PH, PCO2, PO2 in the last 72 hours.   No results for input(s): CPK, CKNDX, TROIQ in the last 72 hours.      No lab exists for component: CPKMB     Lab Results         Component  Value  Date/Time            Cholesterol, total  219 (H)  10/30/2018 10:27 AM       HDL Cholesterol  63  10/30/2018 10:27 AM       LDL, calculated  130 (H)  10/30/2018 10:27 AM       Triglyceride  131  10/30/2018 10:27 AM            CHOL/HDL Ratio  3.4  12/26/2007 09:12 AM          Lab Results         Component  Value  Date/Time            Glucose (POC)  186 (H)  12/14/2019 11:24 AM       Glucose (POC)  136 (H)  12/14/2019 06:40 AM       Glucose (POC)  188 (H)  12/13/2019 09:03 PM       Glucose (POC)  129 (H)  12/13/2019 04:21 PM            Glucose (POC)  130 (H)  12/13/2019 10:59 AM          Lab Results         Component  Value   Date/Time            Color  YELLOW/STRAW  12/13/2019 06:24 AM       Appearance  CLEAR  12/13/2019  06:24 AM       Specific gravity  1.022  12/13/2019 06:24 AM       pH (UA)  5.5  12/13/2019 06:24 AM       Protein  100 (A)  12/13/2019 06:24 AM       Glucose  Negative  12/13/2019 06:24 AM       Ketone  Negative  12/13/2019 06:24 AM       Bilirubin  Negative  12/13/2019 06:24 AM       Urobilinogen  0.2  12/13/2019 06:24 AM       Nitrites  Negative  12/13/2019 06:24 AM       Leukocyte Esterase  Negative  12/13/2019 06:24 AM       Epithelial cells  FEW  12/13/2019 06:24 AM       Bacteria  Negative  12/13/2019 06:24 AM       WBC  0-4  12/13/2019 06:24 AM            RBC  0-5  12/13/2019 06:24 AM                Medications Reviewed:          Current Facility-Administered Medications          Medication  Dose  Route  Frequency           ?  acetaminophen (TYLENOL) suppository 650 mg   650 mg  Rectal  Q6H PRN     ?  piperacillin-tazobactam (ZOSYN) 3.375 g in 0.9% sodium chloride (MBP/ADV) 100 mL MBP   3.375 g  IntraVENous  Q8H     ?  Vancomycin - Pharmacy to Dose     Other  Rx Dosing/Monitoring     ?  vancomycin (VANCOCIN) 1,000 mg in 0.9% sodium chloride 250 mL (VIAL-MATE)   1,000 mg  IntraVENous  Q16H     ?  amLODIPine (NORVASC) tablet 5 mg   5 mg  Oral  DAILY     ?  [Held by provider] losartan (COZAAR) tablet 50 mg   50 mg  Oral  DAILY     ?  lactulose (CHRONULAC) 10 gram/15 mL solution 30 mL   20 g  Oral  BID     ?  alteplase (CATHFLO) 1 mg in sterile water (preservative free) 1 mL injection   1 mg  InterCATHeter  PRN     ?  insulin glargine (LANTUS) injection 12 Units   12 Units  SubCUTAneous  QHS     ?  acetaminophen (TYLENOL) tablet 650 mg   650 mg  Oral  Q6H     ?  gabapentin (NEURONTIN) capsule 300 mg   300 mg  Oral  QHS     ?  gabapentin (NEURONTIN) capsule 100 mg   100 mg  Oral  DAILY     ?  sodium chloride (NS) flush 5-40 mL   5-40 mL  IntraVENous  Q8H     ?  sodium chloride (NS) flush 5-40 mL   5-40 mL  IntraVENous   PRN     ?  sodium phosphate (FLEET'S) enema 1 Enema   1 Enema  Rectal  PRN     ?  oxyCODONE IR (ROXICODONE) tablet 5 mg   5 mg  Oral  Q4H PRN     ?  docusate sodium (COLACE) capsule 100 mg   100 mg  Oral  BID     ?  senna (SENOKOT) tablet 8.6  mg   1 Tab  Oral  DAILY     ?  polyethylene glycol (MIRALAX) packet 17 g   17 g  Oral  DAILY PRN     ?  bisacodyL (DULCOLAX) suppository 10 mg   10 mg  Rectal  DAILY PRN     ?  [Held by provider] hydroCHLOROthiazide (HYDRODIURIL) tablet 25 mg   25 mg  Oral  DAILY     ?  sodium chloride (NS) flush 5-40 mL   5-40 mL  IntraVENous  Q8H     ?  sodium chloride (NS) flush 5-40 mL   5-40 mL  IntraVENous  PRN     ?  promethazine (PHENERGAN) tablet 12.5 mg   12.5 mg  Oral  Q6H PRN          Or           ?  ondansetron (ZOFRAN) injection 4 mg   4 mg  IntraVENous  Q6H PRN           ?  glucose chewable tablet 16 g   4 Tab  Oral  PRN     ?  dextrose (D50W) injection syrg 12.5-25 g   25-50 mL  IntraVENous  PRN     ?  glucagon (GLUCAGEN) injection 1 mg   1 mg  IntraMUSCular  PRN     ?  insulin lispro (HUMALOG) injection     SubCUTAneous  AC&HS     ?  aspirin delayed-release tablet 81 mg   81 mg  Oral  DAILY     ?  pantoprazole (PROTONIX) tablet 40 mg   40 mg  Oral  ACB           ?  enoxaparin (LOVENOX) injection 40 mg   40 mg  SubCUTAneous  Q24H        ______________________________________________________________________   EXPECTED LENGTH OF STAY: 7d 0h   ACTUAL LENGTH OF STAY:          21                    Cheryll Cockayne, MD

## 2019-12-14 NOTE — Progress Notes (Signed)
ID Progress Note  12/14/2019       Left tma 3/18    Subjective:       Afebrile. No dyspnea, abdominal pain, headache or sore throat    ROS: No anaphylaxis, seizures, syncope, hematemesis, hematochezia     Objective:     Vitals:   Visit Vitals  BP (!) 89/57 (BP 1 Location: Left upper arm, BP Patient Position: At rest)   Pulse 96   Temp 99.5 ??F (37.5 ??C)   Resp 17   Ht 5' 5"  (1.651 m)   Wt 78.8 kg (173 lb 12.8 oz)   SpO2 96%   BMI 28.92 kg/m??        Tmax:  Temp (24hrs), Avg:100.2 ??F (37.9 ??C), Min:98.6 ??F (37 ??C), Max:103 ??F (39.4 ??C)      Exam:    Not in distress  Pink conjunctivae, anicteric sclerae  No cervical lymphdenopathy   Lung clear, no rales, wheezes or rhonchi   Heart: s1, s2, RRR, no murmurs rubs or clicks  Abdomen: soft nontender, no guarding or rebound  Knees not warm or tender  Left foot anterior leg bandaged.     Labs:   Lab Results   Component Value Date/Time    WBC 14.3 (H) 12/14/2019 03:22 AM    HGB 8.2 (L) 12/14/2019 03:22 AM    HCT 24.2 (L) 12/14/2019 03:22 AM    PLATELET 338 12/14/2019 03:22 AM    MCV 90.0 12/14/2019 03:22 AM     Lab Results   Component Value Date/Time    Sodium 133 (L) 12/14/2019 03:22 AM    Potassium 3.9 12/14/2019 03:22 AM    Chloride 104 12/14/2019 03:22 AM    CO2 20 (L) 12/14/2019 03:22 AM    Anion gap 9 12/14/2019 03:22 AM    Glucose 145 (H) 12/14/2019 03:22 AM    BUN 12 12/14/2019 03:22 AM    Creatinine 1.04 (H) 12/14/2019 03:22 AM    BUN/Creatinine ratio 12 12/14/2019 03:22 AM    GFR est AA >60 12/14/2019 03:22 AM    GFR est non-AA 52 (L) 12/14/2019 03:22 AM    Calcium 8.3 (L) 12/14/2019 03:22 AM    Bilirubin, total 0.2 11/23/2019 01:32 PM    Alk. phosphatase 150 (H) 11/23/2019 01:32 PM    Protein, total 7.8 11/23/2019 01:32 PM    Albumin 4.0 11/23/2019 01:32 PM    Globulin 3.8 11/23/2019 01:32 PM    A-G Ratio 1.1 11/23/2019 01:32 PM    ALT (SGPT) 23 11/23/2019 01:32 PM             Assessment:     #1 diabetic left foot infection with osteomyelitis of the left great  toe  ??  #2 hyperlipidemia  ??  #3 hypertension  ??  #4 diabetes  ??  #5 new onset fever    Recommendations:       Had TMA 3/18. Pathology of proximal margin shows no osteo.     She has new onset fever. I agree with vancomycin and Zosyn. Follow-up cultures    Team available tomorrow if needed     Blair Hailey, MD

## 2019-12-14 NOTE — Progress Notes (Signed)
 Day #2 of Vancomycin  Indication:  osteo of first distal phalanx of L great toe, dry gangrene on L 2nd toe  -recurrent fevers of unknown origin - per ID, TMA site clean  Current regimen:  vanc 1g q16h (3/6-3/14, 3/15-3/22, 3/26-present)  Abx regimen:  vanc/pip-tazo  ID Following ?: YES  Concomitant nephrotoxic drugs (requires more frequent monitoring): None  Frequency of BMP?: daily    Recent Labs     12/14/19  0322 12/12/19  1620 12/12/19  0152   WBC 14.3* 7.5 7.1   CREA 1.04*  --  0.90   BUN 12  --  12     Est CrCl: 50-55 ml/min; UO: -- ml/kg/hr  Temp (24hrs), Avg:100.4 F (38 C), Min:98.6 F (37 C), Max:103 F (39.4 C)    Cultures:   3/25 blood: NG x2 days; prelim  3/18 L foot wound: pan (S) Pseudomonas; final    Goal trough = 15 - 20 mcg/mL    Recent trough history (date/time/level/dose/action taken):  3/22 = 21.1 mcg/ml (~15h post-dose on 1.25g q16h) changed to 1g q16h  3/11 = 18.4 mcg/ml (on 1g q12h) changed to 1g q16h  3/9 = 12 mcg/ml (~12h post-dose on 1g q12h); continued same    Plan: Continue current regimen. Will assess dosing once at steady state.    Harlene Pouch, [place]  [id number]

## 2019-12-14 NOTE — Progress Notes (Addendum)
Day #2 of Vancomycin  Indication:  osteo of first distal phalanx of L great toe, dry gangrene on L 2nd toe  -recurrent fevers of unknown origin - per ID, TMA site clean  Current regimen:  vanc 1g q16h (3/6-3/14, 3/15-3/22, 3/26-present)  Abx regimen:  vanc/pip-tazo  ID Following ?: YES  Concomitant nephrotoxic drugs (requires more frequent monitoring): None  Frequency of BMP?: daily    Recent Labs     12/14/19  0322 12/12/19  1620 12/12/19  0152   WBC 14.3* 7.5 7.1   CREA 1.04*  --  0.90   BUN 12  --  12     Est CrCl: 50-55 ml/min; UO: -- ml/kg/hr  Temp (24hrs), Avg:100.4 ??F (38 ??C), Min:98.6 ??F (37 ??C), Max:103 ??F (39.4 ??C)    Cultures:   3/25 blood: NG x2 days; prelim  3/18 L foot wound: pan (S) Pseudomonas; final    Goal trough = 15 - 20 mcg/mL    Recent trough history (date/time/level/dose/action taken):  3/22 = 21.1 mcg/ml (~15h post-dose on 1.25g q16h) changed to 1g q16h  3/11 = 18.4 mcg/ml (on 1g q12h) changed to 1g q16h  3/9 = 12 mcg/ml (~12h post-dose on 1g q12h); continued same    Plan: Continue current regimen. Will assess dosing once at steady state.    Laurey Morale, [place]

## 2019-12-15 LAB — GLUCOSE, POC
Glucose (POC): 149 mg/dL — ABNORMAL HIGH (ref 65–100)
Glucose (POC): 135 mg/dL — ABNORMAL HIGH (ref 65–100)
Glucose (POC): 117 mg/dL — ABNORMAL HIGH (ref 65–100)
Glucose (POC): 146 mg/dL — ABNORMAL HIGH (ref 65–100)

## 2019-12-15 LAB — METABOLIC PANEL, BASIC
Anion gap: 8 mmol/L (ref 5–15)
BUN/Creatinine ratio: 10 — ABNORMAL LOW (ref 12–20)
BUN: 18 MG/DL (ref 6–20)
CO2: 22 mmol/L (ref 21–32)
Calcium: 8.2 MG/DL — ABNORMAL LOW (ref 8.5–10.1)
Chloride: 104 mmol/L (ref 97–108)
Creatinine: 1.76 MG/DL — ABNORMAL HIGH (ref 0.55–1.02)
GFR est AA: 35 mL/min/{1.73_m2} — ABNORMAL LOW (ref >60)
GFR est non-AA: 29 mL/min/{1.73_m2} — ABNORMAL LOW (ref >60)
Glucose: 122 mg/dL — ABNORMAL HIGH (ref 65–100)
Potassium: 3.8 mmol/L (ref 3.5–5.1)
Sodium: 134 mmol/L — ABNORMAL LOW (ref 136–145)

## 2019-12-15 LAB — CBC WITH AUTOMATED DIFF
ABS. BASOPHILS: 0 10*3/uL (ref 0.0–0.1)
ABS. EOSINOPHILS: 1.6 10*3/uL — ABNORMAL HIGH (ref 0.0–0.4)
ABS. IMM. GRANS.: 0 10*3/uL
ABS. LYMPHOCYTES: 1.4 10*3/uL (ref 0.8–3.5)
ABS. MONOCYTES: 0.7 10*3/uL (ref 0.0–1.0)
ABS. NEUTROPHILS: 7.2 10*3/uL (ref 1.8–8.0)
ABSOLUTE NRBC: 0 10*3/uL (ref 0.00–0.01)
BASOPHILS: 0 % (ref 0–1)
EOSINOPHILS: 15 % — ABNORMAL HIGH (ref 0–7)
HCT: 23.7 % — ABNORMAL LOW (ref 35.0–47.0)
HGB: 7.9 g/dL — ABNORMAL LOW (ref 11.5–16.0)
IMMATURE GRANULOCYTES: 0 %
LYMPHOCYTES: 13 % (ref 12–49)
MCH: 30.4 PG (ref 26.0–34.0)
MCHC: 33.3 g/dL (ref 30.0–36.5)
MCV: 91.2 FL (ref 80.0–99.0)
MONOCYTES: 6 % (ref 5–13)
MPV: 10 FL (ref 8.9–12.9)
NEUTROPHILS: 66 % (ref 32–75)
NRBC: 0 PER 100 WBC
PLATELET: 307 10*3/uL (ref 150–400)
RBC: 2.6 M/uL — ABNORMAL LOW (ref 3.80–5.20)
RDW: 14.2 % (ref 11.5–14.5)
WBC: 10.9 10*3/uL (ref 3.6–11.0)

## 2019-12-15 LAB — CBC WITH AUTO DIFFERENTIAL
Basophils %: 0 % (ref 0–1)
Basophils Absolute: 0 10*3/uL (ref 0.0–0.1)
Eosinophils %: 15 % — ABNORMAL HIGH (ref 0–7)
Eosinophils Absolute: 1.6 10*3/uL — ABNORMAL HIGH (ref 0.0–0.4)
Granulocyte Absolute Count: 0 10*3/uL
Hematocrit: 23.7 % — ABNORMAL LOW (ref 35.0–47.0)
Hemoglobin: 7.9 g/dL — ABNORMAL LOW (ref 11.5–16.0)
Immature Granulocytes %: 0 %
Lymphocytes %: 13 % (ref 12–49)
Lymphocytes Absolute: 1.4 10*3/uL (ref 0.8–3.5)
MCH: 30.4 PG (ref 26.0–34.0)
MCHC: 33.3 g/dL (ref 30.0–36.5)
MCV: 91.2 FL (ref 80.0–99.0)
MPV: 10 FL (ref 8.9–12.9)
Monocytes %: 6 % (ref 5–13)
Monocytes Absolute: 0.7 10*3/uL (ref 0.0–1.0)
NRBC Absolute: 0 10*3/uL (ref 0.00–0.01)
Neutrophils %: 66 % (ref 32–75)
Neutrophils Absolute: 7.2 10*3/uL (ref 1.8–8.0)
Nucleated RBCs: 0 PER 100 WBC
Platelets: 307 10*3/uL (ref 150–400)
RBC: 2.6 M/uL — ABNORMAL LOW (ref 3.80–5.20)
RDW: 14.2 % (ref 11.5–14.5)
WBC: 10.9 10*3/uL (ref 3.6–11.0)

## 2019-12-15 LAB — POCT GLUCOSE
POC Glucose: 149 mg/dL — ABNORMAL HIGH (ref 65–100)
POC Glucose: 135 mg/dL — ABNORMAL HIGH (ref 65–100)
POC Glucose: 117 mg/dL — ABNORMAL HIGH (ref 65–100)
POC Glucose: 146 mg/dL — ABNORMAL HIGH (ref 65–100)

## 2019-12-15 LAB — BASIC METABOLIC PANEL
Anion Gap: 8 mmol/L (ref 5–15)
BUN/Creatinine Ratio: 10 — ABNORMAL LOW (ref 12–20)
BUN: 18 MG/DL (ref 6–20)
CO2: 22 mmol/L (ref 21–32)
Calcium: 8.2 MG/DL — ABNORMAL LOW (ref 8.5–10.1)
Chloride: 104 mmol/L (ref 97–108)
Creatinine: 1.76 MG/DL — ABNORMAL HIGH (ref 0.55–1.02)
GFR African American: 35 mL/min/{1.73_m2} — ABNORMAL LOW (ref >60)
Glucose: 122 mg/dL — ABNORMAL HIGH (ref 65–100)
Potassium: 3.8 mmol/L (ref 3.5–5.1)
Sodium: 134 mmol/L — ABNORMAL LOW (ref 136–145)
eGFR NON-AA: 29 mL/min/{1.73_m2} — ABNORMAL LOW (ref >60)

## 2019-12-15 MED ORDER — SODIUM CHLORIDE 0.9 % IV
INTRAVENOUS | Status: AC
Start: 2019-12-15 — End: 2019-12-16
  Administered 2019-12-15 – 2019-12-16 (×2): via INTRAVENOUS

## 2019-12-15 MED FILL — OXYCODONE 5 MG TAB: 5 mg | ORAL | Qty: 1

## 2019-12-15 MED FILL — ACETAMINOPHEN 325 MG TABLET: 325 mg | ORAL | Qty: 2

## 2019-12-15 MED FILL — GABAPENTIN 100 MG CAP: 100 mg | ORAL | Qty: 1

## 2019-12-15 MED FILL — VANCOMYCIN 1,000 MG IV SOLR: 1000 mg | INTRAVENOUS | Qty: 1000

## 2019-12-15 MED FILL — SENNA LAX 8.6 MG TABLET: 8.6 mg | ORAL | Qty: 1

## 2019-12-15 MED FILL — ASPIRIN 81 MG TAB, DELAYED RELEASE: 81 mg | ORAL | Qty: 1

## 2019-12-15 MED FILL — SODIUM CHLORIDE 0.9 % IV: INTRAVENOUS | Qty: 1000

## 2019-12-15 MED FILL — PIPERACILLIN-TAZOBACTAM 3.375 GRAM IV SOLR: 3.375 gram | INTRAVENOUS | Qty: 3.38

## 2019-12-15 MED FILL — INSULIN GLARGINE 100 UNIT/ML INJECTION: 100 unit/mL | SUBCUTANEOUS | Qty: 1

## 2019-12-15 MED FILL — LACTULOSE 20 GRAM/30 ML ORAL SOLUTION: 20 gram/30 mL | ORAL | Qty: 30

## 2019-12-15 MED FILL — DOK 100 MG CAPSULE: 100 mg | ORAL | Qty: 1

## 2019-12-15 MED FILL — AMLODIPINE 5 MG TAB: 5 mg | ORAL | Qty: 1

## 2019-12-15 MED FILL — GABAPENTIN 300 MG CAP: 300 mg | ORAL | Qty: 1

## 2019-12-15 MED FILL — INSULIN LISPRO 100 UNIT/ML INJECTION: 100 unit/mL | SUBCUTANEOUS | Qty: 1

## 2019-12-15 MED FILL — PANTOPRAZOLE 40 MG TAB, DELAYED RELEASE: 40 mg | ORAL | Qty: 1

## 2019-12-15 MED FILL — ENOXAPARIN 40 MG/0.4 ML SUB-Q SYRINGE: 40 mg/0.4 mL | SUBCUTANEOUS | Qty: 0.4

## 2019-12-15 NOTE — Progress Notes (Signed)
Nurse Guilan Liu gave bedside report to oncoming nurse Kim RN by SBAR and Kardex

## 2019-12-15 NOTE — Progress Notes (Signed)
Patient's News score is 3. Nurse sent perfect serve to on call hospitalist Np mike.

## 2019-12-15 NOTE — Progress Notes (Signed)
Progress  Notes by Cheryll Cockayne, MD at 12/15/19 1336                Author: Cheryll Cockayne, MD  Service: Hospitalist  Author Type: Physician       Filed: 12/15/19 1357  Date of Service: 12/15/19 1336  Status: Signed          Editor: Cheryll Cockayne, MD (Physician)                    [facility]                                                                                              Hospitalist Progress Note   Cheryll Cockayne, MD   Answering service: [phone] OR [phone] from in house phone            Date of Service:  12/15/2019   NAME:  Lindsey Huynh   DOB:  29-Jul-1951   MRN:  [id number]           Admission Summary:           "Lindsey Huynh??is a 69 y.o.??female??with past medical history of diabetes, insulin-dependent, GERD and hypertension comes for return of left foot pain and swelling.      Interval history / Subjective:          F/U for left foot cellulitis, left great toe gangrene   Patient is seen and examined at bedside this AM. Feels ok. No overnight events or new complaints. No fevers.     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 First distal phalanx osteomyelitis.   - completed cefepime. Appreciate ID input.    - surgical margin clear   - Vascular surgery following- Patient to follow up in 2 weeks post- op   - appreciate Podiatry input    - c/w aspirin    ??   Fever 3/25 and 3/26   - normal wbc, lactic, procalcitonin   - UA clean. CXR: normal   - rapid covid negative. pcr negative    - blood cx : NGTD   - surgical site - no drainage   - appreciate ID input   - leucocytosis on 3/27 - resolved    - c/w IV zosyn. vanco discontinued       Anemia    -  No signs of bleeding noted. hgb low stable    - Monitor labs  and transfuse if hgb <7.0??   ??   DMII:??hold metformin while IP   -Cont basal insulin, SSI   -Diabetic diet   -a1c 8.1   - DM education    ??   HTN   -Continue amlodipine. holding Cozaar and HCTZ due to soft BP    ??  GERD   -Cont PPI      Code status: Full    DVT prophylaxis: Lovenox       Care Plan discussed with: Patient/Family, Nurse and Case Manager   Anticipated Disposition: TBD   Anticipated Discharge: [facility]   Date Reviewed:  05/31/2018                         Codes  Class  Noted  POA              Gangrene ([place])  ICD-10-CM: [id number]   ICD-9-CM: 785.4    11/23/2019  Unknown                               Review of Systems:     A comprehensive review of systems was negative except for that written in the HPI.       Xr Foot Lt Ap/lat      Result Date: 12/05/2019   1. Transmetatarsal amputation.      Xr Foot Lt Min 3 V      Result Date: 11/23/2019   No acute abnormality.      Mri Low Ext Lt Wo Cont      Result Date: 12/01/2019   1. First distal phalanx osteomyelitis. 2. No abscess.       Ct Low Ext Lt W Cont      Result Date: 11/24/2019   No CT imaging evidence for soft tissue gas, abscess or osteomyelitis.      Xr Chest Port      Result Date: 12/12/2019   No evidence of acute cardiopulmonary process.       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 Fluoro Dose:  144.09 mGy. vk        Vital Signs:      Last 24hrs VS reviewed since prior progress note. Most recent are:   Visit Vitals      BP  126/69     Pulse  97     Temp  99 ??F (37.2 ??C)     Resp  16     Ht  5\' 5"  (1.651 m)     Wt  81.9 kg (180 lb 9.6 oz)     SpO2  95%        BMI  30.05 kg/m??              Intake/Output Summary (Last 24 hours) at 12/15/2019 1336   Last data filed at 12/15/2019 0251     Gross per 24 hour        Intake  --        Output  300 ml        Net  -300 ml              Physical Examination:                     Constitutional:   No acute distress, cooperative     ENT:   Oral mucosa moist,  oropharynx benign.      Resp:   CTA bilaterally.     CV:   Regular rhythm, normal rate,          GI:   Soft, non distended, non tender.bs+  Musculoskeletal:   LLE dressing         Neurologic:   Moves all extremities.  AAOx3, CN II-XII reviewed, follows commands       Psych:  Good insight, Not anxious nor agitated.         ??                       Data Review:      Review and/or order of clinical lab test   Review and/or order of tests in the medicine section of CPT           Labs:          Recent Labs            12/15/19   0637  12/14/19   0322     WBC  10.9  14.3*     HGB  7.9*  8.2*     HCT  23.7*  24.2*         PLT  307  338          Recent Labs            12/15/19   0637  12/14/19   0322     NA  134*  133*     K  3.8  3.9     CL  104  104     CO2  22  20*     BUN  18  12     CREA  1.76*  1.04*     GLU  122*  145*         CA  8.2*  8.3*        No results for input(s): ALT, AP, TBIL, TBILI, TP, ALB, GLOB, GGT, AML, LPSE in the last 72 hours.      No lab exists for component: SGOT, GPT, AMYP, HLPSE   No results for input(s): INR, PTP, APTT, INREXT, INREXT in the last 72 hours.    No results for input(s): FE, TIBC, PSAT, FERR in the last 72 hours.    No results found for: FOL, RBCF    No results for input(s): PH, PCO2, PO2 in the last 72 hours.   No results for input(s): CPK, CKNDX, TROIQ in the last 72 hours.      No lab exists for component: CPKMB     Lab Results         Component  Value  Date/Time            Cholesterol, total  219 (H)  10/30/2018 10:27 AM       HDL Cholesterol  63  10/30/2018 10:27 AM       LDL, calculated  130 (H)  10/30/2018 10:27 AM       Triglyceride  131  10/30/2018 10:27 AM            CHOL/HDL Ratio  3.4  12/26/2007 09:12 AM          Lab Results         Component  Value  Date/Time            Glucose (POC)  117 (H)  12/15/2019 11:21 AM       Glucose (POC)  135 (H)  12/15/2019 06:28 AM       Glucose (POC)  149 (H)  12/14/2019 09:06 PM       Glucose (POC)  144 (H)  12/14/2019 04:19 PM  Glucose (POC)  186 (H)  12/14/2019 11:24 AM          Lab Results         Component  Value  Date/Time            Color  YELLOW/STRAW  12/13/2019 06:24 AM       Appearance  CLEAR  12/13/2019 06:24 AM       Specific gravity  1.022  12/13/2019 06:24 AM       pH (UA)  5.5  12/13/2019 06:24 AM       Protein  100 (A)  12/13/2019 06:24 AM       Glucose  Negative  12/13/2019 06:24 AM       Ketone  Negative  12/13/2019 06:24 AM       Bilirubin  Negative  12/13/2019 06:24 AM       Urobilinogen  0.2  12/13/2019 06:24 AM       Nitrites  Negative  12/13/2019 06:24 AM       Leukocyte Esterase  Negative  12/13/2019 06:24 AM       Epithelial cells  FEW  12/13/2019 06:24 AM       Bacteria  Negative  12/13/2019 06:24 AM       WBC  0-4  12/13/2019 06:24 AM            RBC  0-5  12/13/2019 06:24 AM                Medications Reviewed:          Current Facility-Administered Medications          Medication  Dose  Route  Frequency           ?  0.9% sodium chloride infusion   75 mL/hr  IntraVENous  CONTINUOUS     ?  acetaminophen (TYLENOL) suppository 650 mg   650 mg  Rectal  Q6H PRN     ?  piperacillin-tazobactam (ZOSYN) 3.375 g in 0.9% sodium chloride (MBP/ADV) 100 mL MBP   3.375 g  IntraVENous  Q8H     ?  amLODIPine (NORVASC) tablet 5 mg   5 mg  Oral  DAILY           ?  [Held by provider] losartan (COZAAR) tablet 50 mg   50 mg  Oral  DAILY           ?  lactulose (CHRONULAC) 10 gram/15 mL solution 30 mL   20 g  Oral  BID     ?  alteplase (CATHFLO) 1 mg in sterile water (preservative free) 1 mL injection   1 mg  InterCATHeter  PRN     ?  insulin glargine (LANTUS) injection 12 Units   12 Units  SubCUTAneous  QHS     ?  acetaminophen (TYLENOL) tablet 650 mg   650 mg  Oral  Q6H     ?  gabapentin (NEURONTIN) capsule 300 mg   300 mg  Oral  QHS     ?  gabapentin (NEURONTIN) capsule 100 mg   100 mg  Oral  DAILY     ?  sodium chloride (NS) flush 5-40 mL   5-40 mL  IntraVENous  Q8H     ?  sodium chloride (NS) flush 5-40 mL   5-40 mL   IntraVENous  PRN     ?  sodium phosphate (FLEET'S) enema 1 Enema   1 Enema  Rectal  PRN     ?  oxyCODONE IR (ROXICODONE) tablet 5 mg   5 mg  Oral  Q4H PRN     ?  docusate sodium (COLACE) capsule 100 mg   100 mg  Oral  BID     ?  senna (SENOKOT) tablet 8.6 mg   1 Tab  Oral  DAILY     ?  polyethylene glycol (MIRALAX) packet 17 g   17 g  Oral  DAILY PRN     ?  bisacodyL (DULCOLAX) suppository 10 mg   10 mg  Rectal  DAILY PRN     ?  [Held by provider] hydroCHLOROthiazide (HYDRODIURIL) tablet 25 mg   25 mg  Oral  DAILY     ?  sodium chloride (NS) flush 5-40 mL   5-40 mL  IntraVENous  Q8H     ?  sodium chloride (NS) flush 5-40 mL   5-40 mL  IntraVENous  PRN     ?  promethazine (PHENERGAN) tablet 12.5 mg   12.5 mg  Oral  Q6H PRN          Or           ?  ondansetron (ZOFRAN) injection 4 mg   4 mg  IntraVENous  Q6H PRN           ?  glucose chewable tablet 16 g   4 Tab  Oral  PRN     ?  dextrose (D50W) injection syrg 12.5-25 g   25-50 mL  IntraVENous  PRN     ?  glucagon (GLUCAGEN) injection 1 mg   1 mg  IntraMUSCular  PRN     ?  insulin lispro (HUMALOG) injection     SubCUTAneous  AC&HS     ?  aspirin delayed-release tablet 81 mg   81 mg  Oral  DAILY     ?  pantoprazole (PROTONIX) tablet 40 mg   40 mg  Oral  ACB           ?  enoxaparin (LOVENOX) injection 40 mg   40 mg  SubCUTAneous  Q24H        ______________________________________________________________________   EXPECTED LENGTH OF STAY: 7d 0h   ACTUAL LENGTH OF STAY:          Desha, MD

## 2019-12-15 NOTE — Progress Notes (Signed)
Bedside and Verbal shift change report given to Christy (oncoming nurse) by Kim (offgoing nurse). Report included the following information SBAR, Kardex and MAR.

## 2019-12-15 NOTE — Progress Notes (Signed)
[facility]                                                                                          Hospitalist Progress Note  Cheryll Cockayne, MD  Answering service: [phone] OR [phone] from in house phone        Date of Service:  12/15/2019  NAME:  Lindsey Huynh  DOB:  [birth year]  MRN:  [id number]      Admission Summary:     "Shamyiah Rosengrant??is a 69 y.o.??female??with past medical history of diabetes, insulin-dependent, GERD and hypertension comes for return of left foot pain and swelling.    Interval history / Subjective:     F/U for left foot cellulitis, left great toe gangrene  Patient is seen and examined at bedside this AM. Feels ok. No overnight events or new complaints. No fevers.    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 First distal phalanx osteomyelitis.  - completed cefepime. Appreciate ID input.   - surgical margin clear  - Vascular surgery following- Patient to follow up in 2 weeks post- op  - appreciate Podiatry input   - c/w aspirin   ??  Fever 3/25 and 3/26  - normal wbc, lactic, procalcitonin  - UA clean. CXR: normal  - rapid covid negative. pcr negative   - blood cx : NGTD  - surgical site - no drainage  - appreciate ID input  - leucocytosis on 3/27 - resolved   - c/w IV zosyn. vanco discontinued     Anemia   -  No signs of bleeding noted. hgb low stable   - Monitor labs and transfuse if hgb <7.0??  ??  DMII:??hold metformin while IP  -Cont basal insulin, SSI  -Diabetic diet  -a1c 8.1  - DM education   ??  HTN  -Continue amlodipine. holding Cozaar and HCTZ due to soft BP   ??  GERD  -Cont PPI    Code status: Full   DVT prophylaxis: Lovenox     Care Plan discussed with: Patient/Family, Nurse and Case Manager  Anticipated  Disposition: TBD  Anticipated Discharge: [facility]  Date Reviewed: 06-03-18          Codes Class Noted POA    Gangrene ([place]) ICD-10-CM: [id number]  ICD-9-CM: 785.4  11/23/2019 Unknown                Review of Systems:   A comprehensive review of systems was negative except for that written in the HPI.     Xr Foot Lt Ap/lat    Result Date: 12/05/2019  1. Transmetatarsal amputation.    Xr Foot Lt Min 3 V    Result Date: 11/23/2019  No acute abnormality.    Mri Low Ext Lt Wo Cont    Result Date:  12/01/2019  1. First distal phalanx osteomyelitis. 2. No abscess.     Ct Low Ext Lt W Cont    Result Date: 11/24/2019  No CT imaging evidence for soft tissue gas, abscess or osteomyelitis.    Xr Chest Port    Result Date: 12/12/2019  No evidence of acute cardiopulmonary process.     Retail banker Technologist Service    Result Date: 11/25/2019  FLUOROSCOPY WAS USED. Fluoro Dose:  144.09 mGy. vk    Vital Signs:    Last 24hrs VS reviewed since prior progress note. Most recent are:  Visit Vitals  BP 126/69   Pulse 97   Temp 99 ??F (37.2 ??C)   Resp 16   Ht 5\' 5"  (1.651 m)   Wt 81.9 kg (180 lb 9.6 oz)   SpO2 95%   BMI 30.05 kg/m??         Intake/Output Summary (Last 24 hours) at 12/15/2019 1336  Last data filed at 12/15/2019 0251  Gross per 24 hour   Intake ???   Output 300 ml   Net -300 ml        Physical Examination:             Constitutional:  No acute distress, cooperative   ENT:  Oral mucosa moist, oropharynx benign.    Resp:  CTA bilaterally.   CV:  Regular rhythm, normal rate,     GI:  Soft, non distended, non tender.bs+    Musculoskeletal:  LLE dressing    Neurologic:  Moves all extremities.  AAOx3, CN II-XII reviewed, follows commands     Psych:  Good insight, Not anxious nor agitated.     ??            Data Review:    Review and/or order of clinical lab test  Review and/or order of tests in the medicine section of CPT      Labs:     Recent Labs     12/15/19  0637 12/14/19  0322   WBC 10.9 14.3*   HGB 7.9* 8.2*   HCT 23.7* 24.2*    PLT 307 338     Recent Labs     12/15/19  0637 12/14/19  0322   NA 134* 133*   K 3.8 3.9   CL 104 104   CO2 22 20*   BUN 18 12   CREA 1.76* 1.04*   GLU 122* 145*   CA 8.2* 8.3*     No results for input(s): ALT, AP, TBIL, TBILI, TP, ALB, GLOB, GGT, AML, LPSE in the last 72 hours.    No lab exists for component: SGOT, GPT, AMYP, HLPSE  No results for input(s): INR, PTP, APTT, INREXT, INREXT in the last 72 hours.   No results for input(s): FE, TIBC, PSAT, FERR in the last 72 hours.   No results found for: FOL, RBCF   No results for input(s): PH, PCO2, PO2 in the last 72 hours.  No results for input(s): CPK, CKNDX, TROIQ in the last 72 hours.    No lab exists for component: CPKMB  Lab Results   Component Value Date/Time    Cholesterol, total 219 (H) 10/30/2018 10:27 AM    HDL Cholesterol 63 10/30/2018 10:27 AM    LDL, calculated 130 (H) 10/30/2018 10:27 AM    Triglyceride 131 10/30/2018 10:27 AM    CHOL/HDL Ratio 3.4 12/26/2007 09:12 AM     Lab Results   Component Value Date/Time  Glucose (POC) 117 (H) 12/15/2019 11:21 AM    Glucose (POC) 135 (H) 12/15/2019 06:28 AM    Glucose (POC) 149 (H) 12/14/2019 09:06 PM    Glucose (POC) 144 (H) 12/14/2019 04:19 PM    Glucose (POC) 186 (H) 12/14/2019 11:24 AM     Lab Results   Component Value Date/Time    Color YELLOW/STRAW 12/13/2019 06:24 AM    Appearance CLEAR 12/13/2019 06:24 AM    Specific gravity 1.022 12/13/2019 06:24 AM    pH (UA) 5.5 12/13/2019 06:24 AM    Protein 100 (A) 12/13/2019 06:24 AM    Glucose Negative 12/13/2019 06:24 AM    Ketone Negative 12/13/2019 06:24 AM    Bilirubin Negative 12/13/2019 06:24 AM    Urobilinogen 0.2 12/13/2019 06:24 AM    Nitrites Negative 12/13/2019 06:24 AM    Leukocyte Esterase Negative 12/13/2019 06:24 AM    Epithelial cells FEW 12/13/2019 06:24 AM    Bacteria Negative 12/13/2019 06:24 AM    WBC 0-4 12/13/2019 06:24 AM    RBC 0-5 12/13/2019 06:24 AM         Medications Reviewed:     Current Facility-Administered Medications    Medication Dose Route Frequency   ??? 0.9% sodium chloride infusion  75 mL/hr IntraVENous CONTINUOUS   ??? acetaminophen (TYLENOL) suppository 650 mg  650 mg Rectal Q6H PRN   ??? piperacillin-tazobactam (ZOSYN) 3.375 g in 0.9% sodium chloride (MBP/ADV) 100 mL MBP  3.375 g IntraVENous Q8H   ??? amLODIPine (NORVASC) tablet 5 mg  5 mg Oral DAILY   ??? [Held by provider] losartan (COZAAR) tablet 50 mg  50 mg Oral DAILY   ??? lactulose (CHRONULAC) 10 gram/15 mL solution 30 mL  20 g Oral BID   ??? alteplase (CATHFLO) 1 mg in sterile water (preservative free) 1 mL injection  1 mg InterCATHeter PRN   ??? insulin glargine (LANTUS) injection 12 Units  12 Units SubCUTAneous QHS   ??? acetaminophen (TYLENOL) tablet 650 mg  650 mg Oral Q6H   ??? gabapentin (NEURONTIN) capsule 300 mg  300 mg Oral QHS   ??? gabapentin (NEURONTIN) capsule 100 mg  100 mg Oral DAILY   ??? sodium chloride (NS) flush 5-40 mL  5-40 mL IntraVENous Q8H   ??? sodium chloride (NS) flush 5-40 mL  5-40 mL IntraVENous PRN   ??? sodium phosphate (FLEET'S) enema 1 Enema  1 Enema Rectal PRN   ??? oxyCODONE IR (ROXICODONE) tablet 5 mg  5 mg Oral Q4H PRN   ??? docusate sodium (COLACE) capsule 100 mg  100 mg Oral BID   ??? senna (SENOKOT) tablet 8.6 mg  1 Tab Oral DAILY   ??? polyethylene glycol (MIRALAX) packet 17 g  17 g Oral DAILY PRN   ??? bisacodyL (DULCOLAX) suppository 10 mg  10 mg Rectal DAILY PRN   ??? [Held by provider] hydroCHLOROthiazide (HYDRODIURIL) tablet 25 mg  25 mg Oral DAILY   ??? sodium chloride (NS) flush 5-40 mL  5-40 mL IntraVENous Q8H   ??? sodium chloride (NS) flush 5-40 mL  5-40 mL IntraVENous PRN   ??? promethazine (PHENERGAN) tablet 12.5 mg  12.5 mg Oral Q6H PRN    Or   ??? ondansetron (ZOFRAN) injection 4 mg  4 mg IntraVENous Q6H PRN   ??? glucose chewable tablet 16 g  4 Tab Oral PRN   ??? dextrose (D50W) injection syrg 12.5-25 g  25-50 mL IntraVENous PRN   ??? glucagon (GLUCAGEN) injection 1 mg  1 mg IntraMUSCular PRN   ???  insulin lispro (HUMALOG) injection   SubCUTAneous AC&HS   ??? aspirin  delayed-release tablet 81 mg  81 mg Oral DAILY   ??? pantoprazole (PROTONIX) tablet 40 mg  40 mg Oral ACB   ??? enoxaparin (LOVENOX) injection 40 mg  40 mg SubCUTAneous Q24H     ______________________________________________________________________  EXPECTED LENGTH OF STAY: 7d 0h  ACTUAL LENGTH OF STAY:          Beckett, MD

## 2019-12-16 ENCOUNTER — Inpatient Hospital Stay: Admit: 2019-12-16 | Payer: Medicare (Managed Care) | Primary: Family

## 2019-12-16 LAB — CBC WITH AUTOMATED DIFF
ABS. BASOPHILS: 0 10*3/uL (ref 0.0–0.1)
ABS. EOSINOPHILS: 2 10*3/uL — ABNORMAL HIGH (ref 0.0–0.4)
ABS. IMM. GRANS.: 0 10*3/uL
ABS. LYMPHOCYTES: 1.8 10*3/uL (ref 0.8–3.5)
ABS. MONOCYTES: 0.3 10*3/uL (ref 0.0–1.0)
ABS. NEUTROPHILS: 8.5 10*3/uL — ABNORMAL HIGH (ref 1.8–8.0)
ABSOLUTE NRBC: 0 10*3/uL (ref 0.00–0.01)
BASOPHILS: 0 % (ref 0–1)
EOSINOPHILS: 16 % — ABNORMAL HIGH (ref 0–7)
HCT: 25.9 % — ABNORMAL LOW (ref 35.0–47.0)
HGB: 8.5 g/dL — ABNORMAL LOW (ref 11.5–16.0)
IMMATURE GRANULOCYTES: 0 %
LYMPHOCYTES: 14 % (ref 12–49)
MCH: 30.1 PG (ref 26.0–34.0)
MCHC: 32.8 g/dL (ref 30.0–36.5)
MCV: 91.8 FL (ref 80.0–99.0)
MONOCYTES: 2 % — ABNORMAL LOW (ref 5–13)
MPV: 10.4 FL (ref 8.9–12.9)
NEUTROPHILS: 68 % (ref 32–75)
NRBC: 0 PER 100 WBC
PLATELET: 309 10*3/uL (ref 150–400)
RBC: 2.82 M/uL — ABNORMAL LOW (ref 3.80–5.20)
RDW: 14.4 % (ref 11.5–14.5)
WBC: 12.6 10*3/uL — ABNORMAL HIGH (ref 3.6–11.0)

## 2019-12-16 LAB — GLUCOSE, POC
Glucose (POC): 149 mg/dL — ABNORMAL HIGH (ref 65–100)
Glucose (POC): 120 mg/dL — ABNORMAL HIGH (ref 65–100)
Glucose (POC): 118 mg/dL — ABNORMAL HIGH (ref 65–100)
Glucose (POC): 155 mg/dL — ABNORMAL HIGH (ref 65–100)

## 2019-12-16 LAB — METABOLIC PANEL, BASIC
Anion gap: 11 mmol/L (ref 5–15)
BUN/Creatinine ratio: 11 — ABNORMAL LOW (ref 12–20)
BUN: 25 MG/DL — ABNORMAL HIGH (ref 6–20)
CO2: 18 mmol/L — ABNORMAL LOW (ref 21–32)
Calcium: 8.2 MG/DL — ABNORMAL LOW (ref 8.5–10.1)
Chloride: 105 mmol/L (ref 97–108)
Creatinine: 2.31 MG/DL — ABNORMAL HIGH (ref 0.55–1.02)
GFR est AA: 25 mL/min/{1.73_m2} — ABNORMAL LOW (ref >60)
GFR est non-AA: 21 mL/min/{1.73_m2} — ABNORMAL LOW (ref >60)
Glucose: 110 mg/dL — ABNORMAL HIGH (ref 65–100)
Potassium: 3.5 mmol/L (ref 3.5–5.1)
Sodium: 134 mmol/L — ABNORMAL LOW (ref 136–145)

## 2019-12-16 LAB — VANCOMYCIN, RANDOM: Vancomycin, random: 18.6 UG/ML

## 2019-12-16 LAB — CBC WITH AUTO DIFFERENTIAL
Basophils %: 0 % (ref 0–1)
Basophils Absolute: 0 10*3/uL (ref 0.0–0.1)
Eosinophils %: 16 % — ABNORMAL HIGH (ref 0–7)
Eosinophils Absolute: 2 10*3/uL — ABNORMAL HIGH (ref 0.0–0.4)
Granulocyte Absolute Count: 0 10*3/uL
Hematocrit: 25.9 % — ABNORMAL LOW (ref 35.0–47.0)
Hemoglobin: 8.5 g/dL — ABNORMAL LOW (ref 11.5–16.0)
Immature Granulocytes %: 0 %
Lymphocytes %: 14 % (ref 12–49)
Lymphocytes Absolute: 1.8 10*3/uL (ref 0.8–3.5)
MCH: 30.1 PG (ref 26.0–34.0)
MCHC: 32.8 g/dL (ref 30.0–36.5)
MCV: 91.8 FL (ref 80.0–99.0)
MPV: 10.4 FL (ref 8.9–12.9)
Monocytes %: 2 % — ABNORMAL LOW (ref 5–13)
Monocytes Absolute: 0.3 10*3/uL (ref 0.0–1.0)
NRBC Absolute: 0 10*3/uL (ref 0.00–0.01)
Neutrophils %: 68 % (ref 32–75)
Neutrophils Absolute: 8.5 10*3/uL — ABNORMAL HIGH (ref 1.8–8.0)
Nucleated RBCs: 0 PER 100 WBC
Platelets: 309 10*3/uL (ref 150–400)
RBC: 2.82 M/uL — ABNORMAL LOW (ref 3.80–5.20)
RDW: 14.4 % (ref 11.5–14.5)
WBC: 12.6 10*3/uL — ABNORMAL HIGH (ref 3.6–11.0)

## 2019-12-16 LAB — BASIC METABOLIC PANEL
Anion Gap: 11 mmol/L (ref 5–15)
BUN/Creatinine Ratio: 11 — ABNORMAL LOW (ref 12–20)
BUN: 25 MG/DL — ABNORMAL HIGH (ref 6–20)
CO2: 18 mmol/L — ABNORMAL LOW (ref 21–32)
Calcium: 8.2 MG/DL — ABNORMAL LOW (ref 8.5–10.1)
Chloride: 105 mmol/L (ref 97–108)
Creatinine: 2.31 MG/DL — ABNORMAL HIGH (ref 0.55–1.02)
GFR African American: 25 mL/min/{1.73_m2} — ABNORMAL LOW (ref >60)
Glucose: 110 mg/dL — ABNORMAL HIGH (ref 65–100)
Potassium: 3.5 mmol/L (ref 3.5–5.1)
Sodium: 134 mmol/L — ABNORMAL LOW (ref 136–145)
eGFR NON-AA: 21 mL/min/{1.73_m2} — ABNORMAL LOW (ref >60)

## 2019-12-16 LAB — POCT GLUCOSE
POC Glucose: 149 mg/dL — ABNORMAL HIGH (ref 65–100)
POC Glucose: 120 mg/dL — ABNORMAL HIGH (ref 65–100)
POC Glucose: 118 mg/dL — ABNORMAL HIGH (ref 65–100)
POC Glucose: 155 mg/dL — ABNORMAL HIGH (ref 65–100)

## 2019-12-16 LAB — VANCOMYCIN LEVEL, RANDOM: Vancomycin Rm: 18.6 UG/ML

## 2019-12-16 MED ORDER — ENOXAPARIN 30 MG/0.3 ML SUB-Q SYRINGE
30 mg/0.3 mL | SUBCUTANEOUS | Status: DC
Start: 2019-12-16 — End: 2019-12-18
  Administered 2019-12-16 – 2019-12-17 (×2): via SUBCUTANEOUS

## 2019-12-16 MED ORDER — SODIUM CHLORIDE 0.9 % IV
INTRAVENOUS | Status: DC
Start: 2019-12-16 — End: 2019-12-17
  Administered 2019-12-16: 19:00:00 via INTRAVENOUS

## 2019-12-16 MED FILL — PANTOPRAZOLE 40 MG TAB, DELAYED RELEASE: 40 mg | ORAL | Qty: 1

## 2019-12-16 MED FILL — OXYCODONE 5 MG TAB: 5 mg | ORAL | Qty: 1

## 2019-12-16 MED FILL — INSULIN LISPRO 100 UNIT/ML INJECTION: 100 unit/mL | SUBCUTANEOUS | Qty: 1

## 2019-12-16 MED FILL — ACETAMINOPHEN 325 MG TABLET: 325 mg | ORAL | Qty: 2

## 2019-12-16 MED FILL — LOVENOX 30 MG/0.3 ML SUB-Q SYRINGE: 30 mg/0.3 mL | SUBCUTANEOUS | Qty: 0.3

## 2019-12-16 MED FILL — AMLODIPINE 5 MG TAB: 5 mg | ORAL | Qty: 1

## 2019-12-16 MED FILL — SODIUM CHLORIDE 0.9 % IV: INTRAVENOUS | Qty: 1000

## 2019-12-16 MED FILL — PIPERACILLIN-TAZOBACTAM 3.375 GRAM IV SOLR: 3.375 gram | INTRAVENOUS | Qty: 3.38

## 2019-12-16 MED FILL — GABAPENTIN 300 MG CAP: 300 mg | ORAL | Qty: 1

## 2019-12-16 MED FILL — INSULIN GLARGINE 100 UNIT/ML INJECTION: 100 unit/mL | SUBCUTANEOUS | Qty: 1

## 2019-12-16 MED FILL — ASPIRIN 81 MG TAB, DELAYED RELEASE: 81 mg | ORAL | Qty: 1

## 2019-12-16 MED FILL — GABAPENTIN 100 MG CAP: 100 mg | ORAL | Qty: 1

## 2019-12-16 NOTE — Other (Signed)
Nurse Guilan Liu gave bedside report to oncoming nurse Kim RN by SBAR and Kardex

## 2019-12-16 NOTE — Progress Notes (Signed)
Bedside and Verbal shift change report given to Shanon Brow (Soil scientist) by Maudie Mercury (offgoing nurse). Report included the following information SBAR, Kardex and MAR.

## 2019-12-16 NOTE — Progress Notes (Signed)
Problem: Mobility Impaired (Adult and Pediatric)  Goal: *Acute Goals and Plan of Care (Insert Text)  Description: FUNCTIONAL STATUS PRIOR TO ADMISSION: Patient was independent and active without use of DME.    HOME SUPPORT PRIOR TO ADMISSION: The patient lived alone with no local support.    Physical Therapy  1.  Patient will move from supine to sit and sit to supine , scoot up and down, and roll side to side in bed with supervision/set-up within 7 day(s).  (Improved to stand by assistance).  2.  Patient will transfer from bed to chair and chair to bed with supervision/set-up using the least restrictive device within 7 day(s). (Improved to stand by assistance).  3.  Patient will perform sit to stand with supervision/set-up within 7 day(s). (GOAL MET: update to modified independent).  4.  Patient will ambulate with min A for 50 feet with the least restrictive device within 7 day(s). (Improved to contact guard assistance, only 10 ft).  5.  Patient will ascend/descend 4 stairs with cane and one handrail(s) with supervision/set-up within 7 day(s). (GOAL DISCONTINUED AS she will be going to SNF for Rehab).  6. Patient will be independent with LE exercise program within 7 days.  One goal met and updated. Improved toward remaining goals. Stair goal discontinued as patient is going to SNF for Rehab). They remain appropriate and will continue to work toward same.        Physical Therapy  Reassessed post-surgery 12/06/2019  1.  Patient will move from supine to sit and sit to supine , scoot up and down, and roll side to side in bed with supervision/set-up within 7 day(s).    2.  Patient will transfer from bed to chair and chair to bed with supervision/set-up using the least restrictive device within 7 day(s).  3.  Patient will perform sit to stand with supervision/set-up within 7 day(s).  4.  Patient will ambulate with min A for 50 feet with the least restrictive device within 7 day(s).   5.  Patient will ascend/descend 4  stairs with cane and one handrail(s) with supervision/set-up within 7 day(s).   Goals remain appropriate and will continue to work toward same.      Physical Therapy Goals  Initiated 11/26/2019  1.  Patient will move from supine to sit and sit to supine , scoot up and down, and roll side to side in bed with supervision/set-up within 7 day(s).    2.  Patient will transfer from bed to chair and chair to bed with supervision/set-up using the least restrictive device within 7 day(s).  3.  Patient will perform sit to stand with supervision/set-up within 7 day(s).  4.  Patient will ambulate with supervision/set-up for 50 feet with the least restrictive device within 7 day(s).   5.  Patient will ascend/descend 4 stairs with cane and one handrail(s) with supervision/set-up within 7 day(s).     Outcome: Progressing Towards Goal  PHYSICAL THERAPY REEVALUATION  Patient: Lindsey Huynh (69 y.o. 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 Improved toward remaining goals. Stair goal discontinued as patient is going to SNF for Rehab). They remain appropriate and will continue to work toward same.  Current Level of Function Impacting Discharge (mobility/balance): Stand by assistance to supervison for all mobility. RW, wedge shoe and gait belt for gait.    Functional Outcome Measure:  The patient scored 55/100 on the Barthel outcome measure which is indicative of moderate impaired ability to care for basic self-needs/dependency on others.  Patient is primarily limited due to [place] status on left foot, weight bearing on heel and limitation of distance.      Other factors to consider for discharge: Lives alone/Limited weight bearing/Far  more Independent PLOF  .     Patient will benefit from skilled therapy intervention to address the above noted impairments.       PLAN :  Recommendations and Planned Interventions: bed mobility training, transfer training, gait training, therapeutic exercises, patient and family training/education, and therapeutic activities      Frequency/Duration: Patient will be followed by physical therapy:  5 times a week to address goals.    Recommendation for discharge: (in order for the patient to meet his/her long term goals)  Therapy up to 5 days/week in SNF setting    This discharge recommendation:  Has been made in collaboration with the attending provider and/or case management    Equipment recommendations for successful discharge (if) home: bedside commode and walker: rolling         SUBJECTIVE:   Patient stated ???I am feeling much better today.???    OBJECTIVE DATA SUMMARY:   HISTORY:    Past Medical History:   Diagnosis Date    Diabetes ([place])     insulin + metformin    GERD (gastroesophageal reflux disease)     Hypercholesterolemia     Hypertension      Past Surgical History:   Procedure Laterality Date    HX COLONOSCOPY      HX ORTHOPAEDIC  06-30-10    back surgery (tumor removed)    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 Improved toward remaining goals. Stair goal discontinued as patient is going to SNF for Rehab). They remain appropriate and will continue to work toward same.      Personal factors and/or comorbidities impacting plan of care: Lives alone/Limited weight bearing/Far more Independent PLOF      Home Situation  Home Environment: Private residence  # Steps to Enter: 4  Rails to Enter: Yes  Hand Rails : Right  Wheelchair Ramp: No  One/Two Story Residence: One story  Living Alone: Yes  Support Systems: Family member(s)  Patient Expects to be Discharged to:: Rehabilitation facility  Current DME Used/Available at  Home: Cane, straight  Tub or Shower Type: Tub/Shower combination    EXAMINATION/PRESENTATION/DECISION MAKING:   Critical Behavior:  Neurologic State: Alert, Appropriate for age  Orientation Level: Oriented X4, Appropriate for age  Cognition: Appropriate decision making, Appropriate for age attention/concentration, Appropriate safety awareness, Follows commands  Safety/Judgement: Good awareness of safety precautions  Hearing:  Auditory  Auditory Impairment: None    Range Of Motion:  AROM: Generally decreased, functional           PROM: Generally decreased, functional           Strength:    Strength: Generally decreased, functional                    Tone & Sensation:   Tone: Normal  Sensation: Impaired               Coordination:  Coordination: Within functional limits  Vision:      Functional Mobility:  Bed Mobility:     Supine to Sit: Stand-by assistance  Sit to Supine: Stand-by assistance  Scooting: Supervision  Transfers:  Sit to Stand: Supervision  Stand to Sit: Supervision        Bed to Chair: Supervision              Balance:   Sitting: Intact  Sitting - Static: Good (unsupported)  Sitting - Dynamic: Good (unsupported)  Standing: Impaired;Without support  Standing - Static: Good;Constant support  Standing - Dynamic : Fair;Constant support  Ambulation/Gait Training:  Distance (ft): 10 Feet (ft)  Assistive Device: Walker, rolling;Gait belt  Ambulation - Level of Assistance: Contact guard assistance;Stand-by assistance        Gait Abnormalities: Antalgic;Step to gait     Left Side Weight Bearing: Partial (%);Heel(in wedge shoe)  Base of Support: Widened;Shift to right  Stance: Left decreased;Weight shift(PWB on heel)  Speed/Cadence: Slow  Step Length: Right shortened;Left shortened  Swing Pattern: Left asymmetrical     Interventions: Safety awareness training             Therapeutic Exercises:   HEP written on Communication Board    Functional Measure:  Barthel Index:    Bathing: 0  Bladder: 10   Bowels: 10  Grooming: 5  Dressing: 5  Feeding: 10  Mobility: 0  Stairs: 0  Toilet Use: 5  Transfer (Bed to Chair and Back): 10  Total: 55/100       The Barthel ADL Index: Guidelines  1. The index should be used as a record of what a patient does, not as a record of what a patient could do.  2. The main aim is to establish degree of independence from any help, physical or verbal, however minor and for whatever reason.  3. The need for supervision renders the patient not independent.  4. A patient's performance should be established using the best available evidence. Asking the patient, friends/relatives and nurses are the usual sources, but direct observation and common sense are also important. However direct testing is not needed.  5. Usually the patient's performance over the preceding 24-48 hours is important, but occasionally longer periods will be relevant.  6. Middle categories imply that the patient supplies over 50 per cent of the effort.  7. Use of aids to be independent is allowed.    Daneen Schick., Barthel, D.W. [phone]). Functional evaluation: the Barthel Index. [place] (14)2.  Lucianne Lei der Caballo, J.J.M.F, [place], Diona Browner., Oris Drone., [place], [place] (1999). Measuring the change indisability after inpatient rehabilitation; comparison of the responsiveness of the Barthel Index and Functional Independence Measure. Journal of Neurology, Neurosurgery, and Psychiatry, 66(4), [id number].  Wilford Sports, N.J.A, [place],  W.J.M, & [place]chap, M.A. (2004.) Assessment of post-stroke quality of life in cost-effectiveness studies: The usefulness of the Barthel Index and the EuroQoL-5D. Quality of Life Research, 13, 427-43             Pain Rating:  0-1/10 with pain medications    Activity Tolerance:   Good    After treatment patient left in no apparent distress:   Sitting in chair, Call bell within reach, Caregiver / family present, and nurse notified. PCT in room changing patient's bed while she sits and  bathes.    COMMUNICATION/EDUCATION:   The patient???s  plan of care was discussed with: Occupational therapist, Registered nurse, and Case management and Patient Care Technician.    Fall prevention education was provided and the patient/caregiver indicated understanding., Patient/family have participated as able in goal setting and plan of care., and Patient/family agree to work toward stated goals and plan of care.    Thank you for this referral.  Hilda Lias   Time Calculation: 30 mins

## 2019-12-16 NOTE — Progress Notes (Signed)
TOC: Auth restarted today for SNF to [facility]. Anticipated discharge per MD is Wednesday. BLS to tranport, [facility] on will call. CM following for discharge needs.    RUR: 23%    Nathanial Millman RN/CRM

## 2019-12-16 NOTE — Progress Notes (Signed)
[facility]                                                                                          Hospitalist Progress Note  Cheryll Cockayne, MD  Answering service: [phone] OR [phone] from in house phone        Date of Service:  12/16/2019  NAME:  Lindsey Huynh  DOB:  Jul 30, 1951  MRN:  [id number]      Admission Summary:     "Lindsey Huynh??is a 69 y.o.??female??with past medical history of diabetes, insulin-dependent, GERD and hypertension comes for return of left foot pain and swelling.    Interval history / Subjective:     F/U for left foot cellulitis, left great toe gangrene  Patient is seen and examined at bedside this AM. Feels ok. No overnight events or new complaints. No fevers.  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 First distal phalanx osteomyelitis.  - completed cefepime. Appreciate ID input.   - surgical margin clear  - Vascular surgery following- Patient to follow up in 2 weeks post- op  - appreciate Podiatry input   - c/w aspirin   ??  Fever 3/25 and 3/26  - normal wbc, lactic, procalcitonin  - UA clean. CXR: normal  - rapid covid and pcr negative   - blood cx : NGTD  - surgical site - no drainage  - appreciate ID input  - leucocytosis on 3/27   - c/w IV zosyn. vanco discontinued 3/28    AKI - cr worsening  - could be due to infection vs abx  - renal US ordered  - c/w IVF  - nephrology consult placed  - will monitor renal function     Anemia   -  No signs of bleeding noted. hgb low stable   - Monitor labs and transfuse if hgb <7.0??  ??  DMII:??hold metformin while IP  -Cont basal insulin, SSI  -A1c 8.1  - DM education   ??  HTN  -Continue amlodipine. holding Cozaar and HCTZ due to soft BP   ??  GERD  -Cont PPI    Code status:  Full   DVT prophylaxis: Lovenox     Care Plan discussed with: Patient/Family, Nurse and Case Manager  Anticipated Disposition: TBD  Anticipated Discharge: [facility]  Date Reviewed: 2018/06/20          Codes Class Noted POA    Gangrene ([place]) ICD-10-CM: [id number]  ICD-9-CM: 785.4  11/23/2019 Unknown                Review of Systems:   A comprehensive review of systems was negative except for that written in the HPI.     Xr Foot Lt Ap/lat    Result Date: 12/05/2019  1. Transmetatarsal amputation.    Xr  Foot Lt Min 3 V    Result Date: 11/23/2019  No acute abnormality.    Mri Low Ext Lt Wo Cont    Result Date: 12/01/2019  1. First distal phalanx osteomyelitis. 2. No abscess.     Ct Low Ext Lt W Cont    Result Date: 11/24/2019  No CT imaging evidence for soft tissue gas, abscess or osteomyelitis.    Xr Chest Port    Result Date: 12/12/2019  No evidence of acute cardiopulmonary process.     Retail banker Technologist Service    Result Date: 11/25/2019  FLUOROSCOPY WAS USED. Fluoro Dose:  144.09 mGy. vk    Vital Signs:    Last 24hrs VS reviewed since prior progress note. Most recent are:  Visit Vitals  BP 111/65 (BP Patient Position: At rest)   Pulse 80   Temp 97.9 ??F (36.6 ??C)   Resp 16   Ht 5\' 5"  (1.651 m)   Wt 81.9 kg (180 lb 9.6 oz)   SpO2 97%   BMI 30.05 kg/m??       No intake or output data in the 24 hours ending 12/16/19 1339     Physical Examination:             Constitutional:  No acute distress, cooperative   ENT:  Oral mucosa moist, oropharynx benign.    Resp:  CTA bilaterally.   CV:  Regular rhythm, normal rate,     GI:  Soft, non distended, non tender.bs+    Musculoskeletal:  LLE dressing    Neurologic:  Moves all extremities.  AAOx3, CN II-XII reviewed, follows commands     Psych:  Good insight, Not anxious nor agitated.     ??            Data Review:    Review and/or order of clinical lab test  Review and/or order of tests in the medicine section of CPT      Labs:     Recent Labs     12/16/19  0522 12/15/19  0637    WBC 12.6* 10.9   HGB 8.5* 7.9*   HCT 25.9* 23.7*   PLT 309 307     Recent Labs     12/16/19  0522 12/15/19  0637 12/14/19  0322   NA 134* 134* 133*   K 3.5 3.8 3.9   CL 105 104 104   CO2 18* 22 20*   BUN 25* 18 12   CREA 2.31* 1.76* 1.04*   GLU 110* 122* 145*   CA 8.2* 8.2* 8.3*     No results for input(s): ALT, AP, TBIL, TBILI, TP, ALB, GLOB, GGT, AML, LPSE in the last 72 hours.    No lab exists for component: SGOT, GPT, AMYP, HLPSE  No results for input(s): INR, PTP, APTT, INREXT, INREXT in the last 72 hours.   No results for input(s): FE, TIBC, PSAT, FERR in the last 72 hours.   No results found for: FOL, RBCF   No results for input(s): PH, PCO2, PO2 in the last 72 hours.  No results for input(s): CPK, CKNDX, TROIQ in the last 72 hours.    No lab exists for component: CPKMB  Lab Results   Component Value Date/Time    Cholesterol, total 219 (H) 10/30/2018 10:27 AM    HDL Cholesterol 63 10/30/2018 10:27 AM    LDL, calculated 130 (H) 10/30/2018 10:27 AM    Triglyceride 131 10/30/2018 10:27 AM    CHOL/HDL Ratio  3.4 12/26/2007 09:12 AM     Lab Results   Component Value Date/Time    Glucose (POC) 118 (H) 12/16/2019 11:55 AM    Glucose (POC) 120 (H) 12/16/2019 06:41 AM    Glucose (POC) 149 (H) 12/15/2019 09:06 PM    Glucose (POC) 146 (H) 12/15/2019 04:40 PM    Glucose (POC) 117 (H) 12/15/2019 11:21 AM     Lab Results   Component Value Date/Time    Color YELLOW/STRAW 12/13/2019 06:24 AM    Appearance CLEAR 12/13/2019 06:24 AM    Specific gravity 1.022 12/13/2019 06:24 AM    pH (UA) 5.5 12/13/2019 06:24 AM    Protein 100 (A) 12/13/2019 06:24 AM    Glucose Negative 12/13/2019 06:24 AM    Ketone Negative 12/13/2019 06:24 AM    Bilirubin Negative 12/13/2019 06:24 AM    Urobilinogen 0.2 12/13/2019 06:24 AM    Nitrites Negative 12/13/2019 06:24 AM    Leukocyte Esterase Negative 12/13/2019 06:24 AM    Epithelial cells FEW 12/13/2019 06:24 AM    Bacteria Negative 12/13/2019 06:24 AM    WBC 0-4 12/13/2019 06:24 AM    RBC 0-5  12/13/2019 06:24 AM         Medications Reviewed:     Current Facility-Administered Medications   Medication Dose Route Frequency   ??? acetaminophen (TYLENOL) suppository 650 mg  650 mg Rectal Q6H PRN   ??? piperacillin-tazobactam (ZOSYN) 3.375 g in 0.9% sodium chloride (MBP/ADV) 100 mL MBP  3.375 g IntraVENous Q8H   ??? amLODIPine (NORVASC) tablet 5 mg  5 mg Oral DAILY   ??? [Held by provider] losartan (COZAAR) tablet 50 mg  50 mg Oral DAILY   ??? lactulose (CHRONULAC) 10 gram/15 mL solution 30 mL  20 g Oral BID   ??? alteplase (CATHFLO) 1 mg in sterile water (preservative free) 1 mL injection  1 mg InterCATHeter PRN   ??? insulin glargine (LANTUS) injection 12 Units  12 Units SubCUTAneous QHS   ??? acetaminophen (TYLENOL) tablet 650 mg  650 mg Oral Q6H   ??? gabapentin (NEURONTIN) capsule 300 mg  300 mg Oral QHS   ??? gabapentin (NEURONTIN) capsule 100 mg  100 mg Oral DAILY   ??? sodium chloride (NS) flush 5-40 mL  5-40 mL IntraVENous Q8H   ??? sodium chloride (NS) flush 5-40 mL  5-40 mL IntraVENous PRN   ??? sodium phosphate (FLEET'S) enema 1 Enema  1 Enema Rectal PRN   ??? oxyCODONE IR (ROXICODONE) tablet 5 mg  5 mg Oral Q4H PRN   ??? docusate sodium (COLACE) capsule 100 mg  100 mg Oral BID   ??? senna (SENOKOT) tablet 8.6 mg  1 Tab Oral DAILY   ??? polyethylene glycol (MIRALAX) packet 17 g  17 g Oral DAILY PRN   ??? bisacodyL (DULCOLAX) suppository 10 mg  10 mg Rectal DAILY PRN   ??? [Held by provider] hydroCHLOROthiazide (HYDRODIURIL) tablet 25 mg  25 mg Oral DAILY   ??? sodium chloride (NS) flush 5-40 mL  5-40 mL IntraVENous Q8H   ??? sodium chloride (NS) flush 5-40 mL  5-40 mL IntraVENous PRN   ??? promethazine (PHENERGAN) tablet 12.5 mg  12.5 mg Oral Q6H PRN    Or   ??? ondansetron (ZOFRAN) injection 4 mg  4 mg IntraVENous Q6H PRN   ??? glucose chewable tablet 16 g  4 Tab Oral PRN   ??? dextrose (D50W) injection syrg 12.5-25 g  25-50 mL IntraVENous PRN   ??? glucagon (GLUCAGEN) injection 1  mg  1 mg IntraMUSCular PRN   ??? insulin lispro (HUMALOG) injection    SubCUTAneous AC&HS   ??? aspirin delayed-release tablet 81 mg  81 mg Oral DAILY   ??? pantoprazole (PROTONIX) tablet 40 mg  40 mg Oral ACB   ??? enoxaparin (LOVENOX) injection 40 mg  40 mg SubCUTAneous Q24H     ______________________________________________________________________  EXPECTED LENGTH OF STAY: 7d 0h  ACTUAL LENGTH OF STAY:          [place], MD

## 2019-12-16 NOTE — Progress Notes (Signed)
ID Progress Note  12/16/2019       Left tma 3/18    Subjective:       Afebrile. No dyspnea, abdominal pain, headache or sore throat    ROS: No anaphylaxis, seizures, syncope, hematemesis, hematochezia     Objective:     Vitals:   Visit Vitals  BP 115/70 (BP 1 Location: Left upper arm, BP Patient Position: At rest)   Pulse 81   Temp 97.6 ??F (36.4 ??C)   Resp 16   Ht 5' 5"  (1.651 m)   Wt 81.9 kg (180 lb 9.6 oz)   SpO2 97%   BMI 30.05 kg/m??        Tmax:  Temp (24hrs), Avg:97.9 ??F (36.6 ??C), Min:97.6 ??F (36.4 ??C), Max:98.2 ??F (36.8 ??C)      Exam:    Not in distress  Pink conjunctivae, anicteric sclerae  No cervical lymphdenopathy   Lung clear, no rales, wheezes or rhonchi   Heart: s1, s2, RRR, no murmurs rubs or clicks  Abdomen: soft nontender, no guarding or rebound  Knees not warm or tender  Left foot anterior leg bandaged.     Labs:   Lab Results   Component Value Date/Time    WBC 12.6 (H) 12/16/2019 05:22 AM    HGB 8.5 (L) 12/16/2019 05:22 AM    HCT 25.9 (L) 12/16/2019 05:22 AM    PLATELET 309 12/16/2019 05:22 AM    MCV 91.8 12/16/2019 05:22 AM     Lab Results   Component Value Date/Time    Sodium 134 (L) 12/16/2019 05:22 AM    Potassium 3.5 12/16/2019 05:22 AM    Chloride 105 12/16/2019 05:22 AM    CO2 18 (L) 12/16/2019 05:22 AM    Anion gap 11 12/16/2019 05:22 AM    Glucose 110 (H) 12/16/2019 05:22 AM    BUN 25 (H) 12/16/2019 05:22 AM    Creatinine 2.31 (H) 12/16/2019 05:22 AM    BUN/Creatinine ratio 11 (L) 12/16/2019 05:22 AM    GFR est AA 25 (L) 12/16/2019 05:22 AM    GFR est non-AA 21 (L) 12/16/2019 05:22 AM    Calcium 8.2 (L) 12/16/2019 05:22 AM    Bilirubin, total 0.2 11/23/2019 01:32 PM    Alk. phosphatase 150 (H) 11/23/2019 01:32 PM    Protein, total 7.8 11/23/2019 01:32 PM    Albumin 4.0 11/23/2019 01:32 PM    Globulin 3.8 11/23/2019 01:32 PM    A-G Ratio 1.1 11/23/2019 01:32 PM    ALT (SGPT) 23 11/23/2019 01:32 PM             Assessment:     #1 diabetic left foot infection with osteomyelitis of the left  great toe  ??  #2 hyperlipidemia  ??  #3 hypertension  ??  #4 diabetes  ??  #5 new onset fever - resolved    #6 AKI     Recommendations:       Had TMA 3/18. Pathology of proximal margin shows no osteo.     She has AKI. Blood eos getting higher. Fevers resolved and cultures negative. Discontinue zosyn. Vanc was discontinued yesterday       Blair Hailey, MD

## 2019-12-16 NOTE — Progress Notes (Signed)
TOC: Auth restarted today for SNF to [facility]. Anticipated discharge per MD is Wednesday. BLS to tranport, [facility] on will call. CM following for discharge needs.    RUR: 23%    Estill Batten RN/CRM

## 2019-12-16 NOTE — Consults (Addendum)
Consults by Trisha Mangle, MD at 12/16/19 1354                Author: Trisha Mangle, MD  Service: Nephrology  Author Type: Physician       Filed: 12/16/19 1621  Date of Service: 12/16/19 1354  Status: Addendum          Editor: Trisha Mangle, MD (Physician)          Related Notes: Original Note by Trisha Mangle, MD (Physician) filed at 12/16/19 1354            Consult Orders        1. IP CONSULT TO NEPHROLOGY [[id number]] ordered by Cheryll Cockayne, MD at 12/16/19 [facility]    [address], [place] [address]   Phone: [phone]   [phone]           NEPHROLOGY CONSULT NOTE            Patient: Lindsey Huynh  MRN: [id number]   PCP: Karsten Fells, NP         DOB:     1951-04-15   Age:   69 y.o.   Sex:  female         Referring physician: Cheryll Cockayne, MD   Reason for consultation: 69 y.o.  female with Gangrene [facility]) [W43] complicated by ARF   Admission Date: 11/23/2019 12:32 PM   LOS: 23 days          ASSESSMENT and PLAN :       AKI :   - Creatinine rising steeply. UOP not charted    - etiology : VANC + ZOSYN toxicity, relative Hypotension in last 2 days   - ordered renal US, urine lytes and urine eosinophils    - looks dry on exam -- continue IVF    - avoid all potential nephrotoxins    - labs again in am       Left Foot Osteomyelitis   - S/P TMA 3/18    - On IV zosyn. Off IV vanc    - abx per ID        Type II DM       HTN    - hold ARB and thiazide diuretic       GERD      Care Plan discussed with: pt          Thank you for consulting [facility] in the care of your patient.           Subjective:     HPI: Lindsey Huynh is a 69 y.o. African American female  with  has a past medical history of Diabetes ([place]), GERD (gastroesophageal reflux disease), Hypercholesterolemia, and Hypertension. who has been admitted to the hospital for left foot cellulitis. She was fouind to  have osteomyelitis aof left great toe  and she underwent TMA on 3/18. She was on IV vancomycin until 3/22. She was scheduled for d/c on Thursday 3.25 when she spiked fever and d/c was cancelled. She was started on IV vanc and zosyn. She received 2 gm loading dose and 2 gm / day although she  was on vanc until 3 days  earlier. She also was on cozaar and HCTZ. She developed hypotension in the last 2 days. She reports reduced urine out put for last 3 days . efers have resolved.       Past Medical Hx:      Past Medical History:        Diagnosis  Date         ?  Diabetes ([place])            insulin + metformin         ?  GERD (gastroesophageal reflux disease)       ?  Hypercholesterolemia           ?  Hypertension              Past Surgical Hx:         Past Surgical History:         Procedure  Laterality  Date          ?  HX COLONOSCOPY         ?  HX ORTHOPAEDIC    06-30-10          back surgery (tumor removed)          ?  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  Neuromuscular Blockers, Steroidal  Other (comments)             GI Upset           Social Hx:  reports that she has quit smoking. She quit after 10.00 years of use. She has never used smokeless tobacco. She reports current alcohol use of about 5.0 standard drinks of alcohol per  week. She reports that she does not use drugs.       History reviewed. No pertinent family history.      Review of Systems:   A thorough twelve point review of system was performed today. Pertinent positives and negatives are mentioned in the HPI. The reminder of the ROS is negative and noncontributory.         Objective:      Vitals:       Vitals:             12/15/19 2017  12/16/19 0232  12/16/19 0821  12/16/19 1447           BP:  (!) 94/58  96/68  111/65  115/70     Pulse:  88  82  80  81     Resp:  16  16  16  16      Temp:  98.2 ??F (36.8 ??C)  98 ??F (36.7 ??C)  97.9 ??F (36.6 ??C)  97.6 ??F (36.4 ??C)     SpO2:  96%   96%  97%  97%     Weight:                   Height:                I&O's:  No intake/output data recorded.   Visit Vitals      BP  115/70 (BP 1 Location: Left upper arm, BP Patient Position: At rest)     Pulse  81  Temp  97.6 ??F (36.4 ??C)     Resp  16     Ht  5\' 5"  (1.651 m)     Wt  81.9 kg (180 lb 9.6 oz)     SpO2  97%        BMI  30.05 kg/m??           Physical Exam:   General:  No apparent Distress   HEENT: PERRL, + Pallor , No Icterus   Neck: Supple,no mass palpable   Lungs : CTA   CVS: RRR, S1 S2 normal, No murmur    Abdomen: Soft, NT, BS +   Extremities: LLE - foot in dressing and LE edema +   Skin: No rash or lesions.   MS: No joint swelling, erythema, warmth   Neurologic: non focal, AAO x 3   Psych: normal affect/      Laboratory Results:        Recent Labs             12/16/19   0522  12/15/19   0637  12/14/19   0322     NA  134*  134*  133*     K  3.5  3.8  3.9     CL  105  104  104     CO2  18*  22  20*     GLU  110*  122*  145*     BUN  25*  18  12     CREA  2.31*  1.76*  1.04*          CA  8.2*  8.2*  8.3*          Recent Labs             12/16/19   0522  12/15/19   0637  12/14/19   0322     WBC  12.6*  10.9  14.3*     HGB  8.5*  7.9*  8.2*     HCT  25.9*  23.7*  24.2*          PLT  309  307  338        No results found for: SDES     Lab Results         Component  Value  Date/Time            Culture result:  NO GROWTH 4 DAYS  12/12/2019 04:20 PM       Culture result:  NO GROWTH 5 DAYS  12/07/2019 07:27 AM       Culture result:  NO ANAEROBES ISOLATED  12/05/2019 05:30 PM            Culture result:  LIGHT PSEUDOMONAS AERUGINOSA (A)  12/05/2019 05:30 PM          Recent Results (from the past 24 hour(s))     GLUCOSE, POC          Collection Time: 12/15/19  4:40 PM         Result  Value  Ref Range            Glucose (POC)  146 (H)  65 - 100 mg/dL       Performed by  Higinio Plan (CON)         GLUCOSE, POC          Collection Time: 12/15/19  9:06 PM         Result  Value  Ref  Range            Glucose  (POC)  149 (H)  65 - 100 mg/dL       Performed by  Burton Apley         CBC WITH AUTOMATED DIFF          Collection Time: 12/16/19  5:22 AM         Result  Value  Ref Range            WBC  12.6 (H)  3.6 - 11.0 K/uL       RBC  2.82 (L)  3.80 - 5.20 M/uL       HGB  8.5 (L)  11.5 - 16.0 g/dL       HCT  19.1 (L)  47.8 - 47.0 %       MCV  91.8  80.0 - 99.0 FL       MCH  30.1  26.0 - 34.0 PG       MCHC  32.8  30.0 - 36.5 g/dL       RDW  29.5  62.1 - 14.5 %       PLATELET  309  150 - 400 K/uL       MPV  10.4  8.9 - 12.9 FL       NRBC  0.0  0 PER 100 WBC       ABSOLUTE NRBC  0.00  0.00 - 0.01 K/uL       NEUTROPHILS  68  32 - 75 %       LYMPHOCYTES  14  12 - 49 %       MONOCYTES  2 (L)  5 - 13 %       EOSINOPHILS  16 (H)  0 - 7 %       BASOPHILS  0  0 - 1 %       IMMATURE GRANULOCYTES  0  %       ABS. NEUTROPHILS  8.5 (H)  1.8 - 8.0 K/UL       ABS. LYMPHOCYTES  1.8  0.8 - 3.5 K/UL       ABS. MONOCYTES  0.3  0.0 - 1.0 K/UL            ABS. EOSINOPHILS  2.0 (H)  0.0 - 0.4 K/UL            ABS. BASOPHILS  0.0  0.0 - 0.1 K/UL       ABS. IMM. GRANS.  0.0  K/UL       DF  MANUAL          RBC COMMENTS  POLYCHROMASIA   1+             RBC COMMENTS  OVALOCYTES   PRESENT             METABOLIC PANEL, BASIC          Collection Time: 12/16/19  5:22 AM         Result  Value  Ref Range            Sodium  134 (L)  136 - 145 mmol/L       Potassium  3.5  3.5 - 5.1 mmol/L       Chloride  105  97 - 108 mmol/L       CO2  18 (L)  21 - 32 mmol/L       Anion gap  11  5 -  15 mmol/L       Glucose  110 (H)  65 - 100 mg/dL       BUN  25 (H)  6 - 20 MG/DL       Creatinine  2.31 (H)  0.55 - 1.02 MG/DL       BUN/Creatinine ratio  11 (L)  12 - 20         GFR est AA  25 (L)  >60 ml/min/1.8m2       GFR est non-AA  21 (L)  >60 ml/min/1.62m2       Calcium  8.2 (L)  8.5 - 10.1 MG/DL       GLUCOSE, POC          Collection Time: 12/16/19  6:41 AM         Result  Value  Ref Range            Glucose (POC)  120 (H)  65 - 100 mg/dL       Performed by  Weldon Inches          GLUCOSE, POC          Collection Time: 12/16/19 11:55 AM         Result  Value  Ref Range            Glucose (POC)  118 (H)  65 - 100 mg/dL       Performed by  Loraine Leriche, RANDOM          Collection Time: 12/16/19  2:41 PM         Result  Value  Ref Range            Vancomycin, random  18.6  UG/ML              Urine dipstick:      Lab Results         Component  Value  Date/Time            Color  YELLOW/STRAW  12/13/2019 06:24 AM       Appearance  CLEAR  12/13/2019 06:24 AM       Specific gravity  1.022  12/13/2019 06:24 AM       pH (UA)  5.5  12/13/2019 06:24 AM       Protein  100 (A)  12/13/2019 06:24 AM       Glucose  Negative  12/13/2019 06:24 AM       Ketone  Negative  12/13/2019 06:24 AM       Bilirubin  Negative  12/13/2019 06:24 AM       Urobilinogen  0.2  12/13/2019 06:24 AM       Nitrites  Negative  12/13/2019 06:24 AM       Leukocyte Esterase  Negative  12/13/2019 06:24 AM       Epithelial cells  FEW  12/13/2019 06:24 AM       Bacteria  Negative  12/13/2019 06:24 AM       WBC  0-4  12/13/2019 06:24 AM            RBC  0-5  12/13/2019 06:24 AM           I have reviewed the following:    All pertinent labs, microbiology data, radiology imaging for my assessment          Medications list Personally Reviewed   [x]   Yes     []                No         Medications:   Prior to Admission medications       Medication  Sig  Start Date  End Date  Taking?  Authorizing Provider      acetaminophen (TYLENOL) 325 mg tablet  Take 2 Tabs by mouth every six (6) hours as needed for Pain.  12/12/19    Yes  Madala, Teryl Lucy, MD      docusate sodium (COLACE) 100 mg capsule  Take 1 Cap by mouth two (2) times daily as needed for Constipation for up to 90 days.  12/12/19  03/11/20  Yes  Madala, Teryl Lucy, MD      insulin detemir U-100 (Levemir FlexTouch U-100 Insuln) 100 unit/mL (3 mL) inpn  12 Units by SubCUTAneous route daily (with breakfast).  12/12/19    Yes  Madala, Teryl Lucy, MD      oxyCODONE IR (ROXICODONE) 5  mg immediate release tablet  Take 1 Tab by mouth every six (6) hours as needed for Pain for up to 3 days. Max Daily Amount: 20 mg.  12/12/19  12/15/19  Yes  Madala, Teryl Lucy, MD      aspirin delayed-release 81 mg tablet  Take 1 Tab by mouth daily.  12/12/19    Yes  Madala, Teryl Lucy, MD      metFORMIN ER (GLUCOPHAGE XR) 500 mg tablet  TAKE 2 TABLETS BY MOUTH DAILY WITH DINNER  12/06/19      Bobbie Stack T, NP      valsartan-hydroCHLOROthiazide (DIOVAN-HCT) 160-25 mg per tablet  TAKE 1 TABLET BY MOUTH DAILY  12/06/19      Bobbie Stack T, NP      amLODIPine (NORVASC) 10 mg tablet  Take 1 Tab by mouth daily.  10/15/19      Karsten Fells, NP      gabapentin (NEURONTIN) 300 mg capsule  Take 1 Cap by mouth nightly. Max Daily Amount: 300 mg.  10/15/19      Bobbie Stack T, NP      Insulin Needles, Disposable, (Nano Pen Needle) 32 gauge x 5/32" ndle  Use with insulin pen 3 times daily  10/15/19      Bobbie Stack T, NP      cyclopentolate (CYCLOGYL) 1 % ophthalmic solution    02/19/18      Provider, Historical      aspirin delayed-release 81 mg tablet  Take 81 mg by mouth daily.        Provider, Historical      omeprazole (PRILOSEC) 20 mg capsule  Take 20 mg by mouth daily.        Provider, Historical                Thank you for allowing [place] to participate in the care of this patient.    We will follow patient. Please dont hesitate to call with any questions      Trisha Mangle, MD   [facility]    [address] [place] [address]  Phone - [phone]    Fax - [phone]   [email or web address]

## 2019-12-16 NOTE — Progress Notes (Signed)
Bedside and Verbal shift change report given to David (oncoming nurse) by Kim (offgoing nurse). Report included the following information SBAR, Kardex and MAR.

## 2019-12-16 NOTE — Progress Notes (Signed)
Problem: Self Care Deficits Care Plan (Adult)  Goal: *Acute Goals and Plan of Care (Insert Text)  Description: Occupational Therapy Goals  Initiated: 12/12/2019   1.  Patient will perform grooming with supervision/set-up standing for one activity within 7 day(s).  2.  Patient will perform bathing with set-up/supervision from chair within 7 day(s).  3.  Patient will perform upper body dressing and lower body dressing with set-up/supervision within 7 day(s).  4.  Patient will perform toilet transfers with supervision within 7 day(s).  5.  Patient will perform all aspects of toileting with min A within 7 day(s).    FUNCTIONAL STATUS PRIOR TO ADMISSION: Patient was independent and active without use of DME.    HOME SUPPORT: The patient lived alone with no local support.      Outcome: Progressing Towards Goal   OCCUPATIONAL THERAPY TREATMENT  Patient: Lindsey Huynh (69 y.o. 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    ASSESSMENT  Patient continues with skilled OT services and is progressing towards goals.  Patient received in bed, had already completed morning ADL tasks and completed toileting with PT prior to OT session.  Patient motivated for therapy.  Completed LB dressing training with SBA with additional time for to don post op shoe.  Completed self care transfer training to chair and back to bed with RW with CGA.  Demonstrated excellent ability to maintain LLE PWB at RW level for basic self care transfer.        Current Level of Function Impacting Discharge (ADLs): self care transfer CGA at RW level, don post op shoe SBA at EOB    Other factors to consider for discharge: Patient lived at home alone and was indep with all ADL tasks.  She has  no local support.  Recommend SNF for additional rehab prior to dc home alone.  Patient in agreement and hopeful to go to SNF this week.          PLAN :  Patient continues to benefit from skilled intervention to address the above impairments.  Continue treatment per established plan of care to address goals.    Recommend with staff: Recommend with nursing, ADLs with supervision/setup, once Egress Test completed then OOB to chair 3x/day and toileting via beside commode CGA-SBA assist and walker. Thank you for completing as able in order to maintain patient strength, endurance and independence.       Recommend next OT session: see goals    Recommendation for discharge: (in order for the patient to meet his/her long term goals)  Therapy up to 5 days/week in SNF setting    This discharge recommendation:  Has been made in collaboration with the attending provider and/or case management    IF patient discharges home will need the following DME: TBD SNF       SUBJECTIVE:   Patient stated "."    OBJECTIVE DATA SUMMARY:   Cognitive/Behavioral Status:  Neurologic State: Alert;Appropriate for age  Orientation Level: Oriented X4;Appropriate for age  Cognition: Appropriate decision making;Appropriate for age attention/concentration;Appropriate safety awareness;Follows commands             Functional Mobility and Transfers for ADLs:  Bed Mobility:  Supine to Sit: Stand-by assistance  Sit to Supine: Stand-by assistance  Scooting:  Supervision;Additional time    Transfers:  Sit to Stand: Stand-by assistance     Bed to Chair: Supervision    Balance:  Sitting: Intact  Sitting - Static: Good (unsupported)  Sitting - Dynamic: Good (unsupported)  Standing: Impaired;Without support  Standing - Static: Good;Constant support  Standing - Dynamic : Fair;Constant support    ADL Intervention:     Patient instructed and indicated understanding the benefits of maintaining activity tolerance, functional mobility, and independence with self care  tasks during acute stay  to ensure safe return home and to baseline. Encouraged patient to increase frequency and duration OOB, be out of bed for all meals, perform daily ADLs (as approved by RN/MD regarding bathing etc), and performing functional mobility to/from bathroom.  Provided education through multi-modal cues for RW safety including proper positioning, hand placement, body mechanic and safety.  Patient able to perform sit <> stand with CGA assistance.  Impaired understanding of RW use and safety as she needed additional cues for UE placement from steady surface with sit<> stand, not to pull up on RW.       Lower Body Dressing Assistance  Shoes with Velcro: Contact guard assistance  Leg Crossed Method Used: No-cues for body mechanics, safety at EOB and compensatory techniques            Pain:  Denies pain    Activity Tolerance:   Fair    After treatment patient left in no apparent distress:   Supine in bed, Heels elevated for pressure relief and Call bell within reach    COMMUNICATION/COLLABORATION:   The patient's plan of care was discussed with: Physical therapist, Registered nurse and Case management.     Janeece Agee, OT  Time Calculation: 18 mins

## 2019-12-16 NOTE — Progress Notes (Signed)
Progress  Notes by Cheryll Cockayne, MD at 12/16/19 1339                Author: Cheryll Cockayne, MD  Service: Hospitalist  Author Type: Physician       Filed: 12/16/19 1342  Date of Service: 12/16/19 1339  Status: Signed          Editor: Cheryll Cockayne, MD (Physician)                    [facility]                                                                                              Hospitalist Progress Note   Cheryll Cockayne, MD   Answering service: [phone] OR [phone] from in house phone            Date of Service:  12/16/2019   NAME:  Lindsey Huynh   DOB:  1950/12/22   MRN:  [id number]           Admission Summary:           "Azure Valli??is a 69 y.o.??female??with past medical history of diabetes, insulin-dependent, GERD and hypertension comes for return of left foot pain and swelling.      Interval history / Subjective:          F/U for left foot cellulitis, left great toe gangrene   Patient is seen and examined at bedside this AM. Feels ok. No overnight events or new complaints. No fevers.  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 First distal phalanx osteomyelitis.   - completed cefepime. Appreciate ID input.    - surgical margin clear   - Vascular surgery following- Patient to follow up in 2 weeks post- op   - appreciate Podiatry input    - c/w aspirin    ??   Fever 3/25 and 3/26   - normal wbc, lactic, procalcitonin   - UA clean. CXR: normal   - rapid covid and pcr negative    - blood cx : NGTD   - surgical site - no drainage   - appreciate ID input   - leucocytosis on 3/27    - c/w IV zosyn. vanco discontinued 3/28      AKI - cr worsening   - could be due to infection vs abx   - renal US ordered   - c/w IVF   -  nephrology consult placed   - will monitor renal function       Anemia    -  No signs of bleeding noted. hgb low stable    - Monitor labs and transfuse if hgb <7.0??   ??   DMII:??hold metformin while IP   -  Cont basal insulin, SSI   -A1c 8.1   - DM education    ??   HTN   -Continue amlodipine. holding Cozaar and HCTZ due to soft BP    ??   GERD   -Cont PPI      Code status: Full    DVT prophylaxis: Lovenox       Care Plan discussed with: Patient/Family, Nurse and Case Manager   Anticipated Disposition: TBD   Anticipated Discharge: [facility]   Date Reviewed:  05/31/2018                         Codes  Class  Noted  POA              Gangrene ([place])  ICD-10-CM: [id number]   ICD-9-CM: 785.4    11/23/2019  Unknown                               Review of Systems:     A comprehensive review of systems was negative except for that written in the HPI.       Xr Foot Lt Ap/lat      Result Date: 12/05/2019   1. Transmetatarsal amputation.      Xr Foot Lt Min 3 V      Result Date: 11/23/2019   No acute abnormality.      Mri Low Ext Lt Wo Cont      Result Date: 12/01/2019   1. First distal phalanx osteomyelitis. 2. No abscess.       Ct Low Ext Lt W Cont      Result Date: 11/24/2019   No CT imaging evidence for soft tissue gas, abscess or osteomyelitis.      Xr Chest Port      Result Date: 12/12/2019   No evidence of acute cardiopulmonary process.       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 Fluoro Dose:  144.09 mGy. vk        Vital Signs:      Last 24hrs VS reviewed since prior progress note. Most recent are:   Visit Vitals      BP  111/65 (BP Patient Position: At rest)     Pulse  80     Temp  97.9 ??F (36.6 ??C)     Resp  16     Ht  5\' 5"  (1.651 m)     Wt  81.9 kg (180 lb 9.6 oz)     SpO2  97%        BMI  30.05 kg/m??           No intake or output data in the 24 hours ending 12/16/19 1339         Physical Examination:                     Constitutional:   No acute distress, cooperative      ENT:   Oral mucosa moist, oropharynx benign.      Resp:   CTA bilaterally.     CV:   Regular rhythm, normal rate,          GI:   Soft, non distended, non tender.bs+         Musculoskeletal:  LLE dressing         Neurologic:   Moves all extremities.  AAOx3, CN II-XII reviewed, follows commands       Psych:  Good insight, Not anxious nor agitated.         ??                       Data Review:      Review and/or order of clinical lab test   Review and/or order of tests in the medicine section of CPT           Labs:          Recent Labs            12/16/19   0522  12/15/19   0637     WBC  12.6*  10.9     HGB  8.5*  7.9*     HCT  25.9*  23.7*         PLT  309  307          Recent Labs             12/16/19   0522  12/15/19   0637  12/14/19   0322     NA  134*  134*  133*     K  3.5  3.8  3.9     CL  105  104  104     CO2  18*  22  20*     BUN  25*  18  12     CREA  2.31*  1.76*  1.04*     GLU  110*  122*  145*          CA  8.2*  8.2*  8.3*        No results for input(s): ALT, AP, TBIL, TBILI, TP, ALB, GLOB, GGT, AML, LPSE in the last 72 hours.      No lab exists for component: SGOT, GPT, AMYP, HLPSE   No results for input(s): INR, PTP, APTT, INREXT, INREXT in the last 72 hours.    No results for input(s): FE, TIBC, PSAT, FERR in the last 72 hours.    No results found for: FOL, RBCF    No results for input(s): PH, PCO2, PO2 in the last 72 hours.   No results for input(s): CPK, CKNDX, TROIQ in the last 72 hours.      No lab exists for component: CPKMB     Lab Results         Component  Value  Date/Time            Cholesterol, total  219 (H)  10/30/2018 10:27 AM       HDL Cholesterol  63  10/30/2018 10:27 AM       LDL, calculated  130 (H)  10/30/2018 10:27 AM       Triglyceride  131  10/30/2018 10:27 AM            CHOL/HDL Ratio  3.4  12/26/2007 09:12 AM          Lab Results         Component  Value  Date/Time            Glucose (POC)  118 (H)  12/16/2019 11:55 AM       Glucose (POC)  120 (H)  12/16/2019 06:41 AM        Glucose (POC)  149 (H)  12/15/2019 09:06 PM       Glucose (POC)  146 (H)  12/15/2019 04:40 PM            Glucose (POC)  117 (H)  12/15/2019 11:21 AM          Lab Results         Component  Value  Date/Time            Color  YELLOW/STRAW  12/13/2019 06:24 AM       Appearance  CLEAR  12/13/2019 06:24 AM       Specific gravity  1.022  12/13/2019 06:24 AM       pH (UA)  5.5  12/13/2019 06:24 AM       Protein  100 (A)  12/13/2019 06:24 AM       Glucose  Negative  12/13/2019 06:24 AM       Ketone  Negative  12/13/2019 06:24 AM       Bilirubin  Negative  12/13/2019 06:24 AM       Urobilinogen  0.2  12/13/2019 06:24 AM       Nitrites  Negative  12/13/2019 06:24 AM       Leukocyte Esterase  Negative  12/13/2019 06:24 AM       Epithelial cells  FEW  12/13/2019 06:24 AM       Bacteria  Negative  12/13/2019 06:24 AM       WBC  0-4  12/13/2019 06:24 AM            RBC  0-5  12/13/2019 06:24 AM                Medications Reviewed:          Current Facility-Administered Medications          Medication  Dose  Route  Frequency           ?  acetaminophen (TYLENOL) suppository 650 mg   650 mg  Rectal  Q6H PRN     ?  piperacillin-tazobactam (ZOSYN) 3.375 g in 0.9% sodium chloride (MBP/ADV) 100 mL MBP   3.375 g  IntraVENous  Q8H     ?  amLODIPine (NORVASC) tablet 5 mg   5 mg  Oral  DAILY     ?  [Held by provider] losartan (COZAAR) tablet 50 mg   50 mg  Oral  DAILY     ?  lactulose (CHRONULAC) 10 gram/15 mL solution 30 mL   20 g  Oral  BID           ?  alteplase (CATHFLO) 1 mg in sterile water (preservative free) 1 mL injection   1 mg  InterCATHeter  PRN           ?  insulin glargine (LANTUS) injection 12 Units   12 Units  SubCUTAneous  QHS     ?  acetaminophen (TYLENOL) tablet 650 mg   650 mg  Oral  Q6H     ?  gabapentin (NEURONTIN) capsule 300 mg   300 mg  Oral  QHS     ?  gabapentin (NEURONTIN) capsule 100 mg   100 mg  Oral  DAILY     ?  sodium chloride (NS) flush 5-40 mL   5-40 mL  IntraVENous  Q8H     ?  sodium chloride (NS) flush  5-40 mL   5-40 mL  IntraVENous  PRN     ?  sodium phosphate (FLEET'S) enema 1 Enema  1 Enema  Rectal  PRN     ?  oxyCODONE IR (ROXICODONE) tablet 5 mg   5 mg  Oral  Q4H PRN     ?  docusate sodium (COLACE) capsule 100 mg   100 mg  Oral  BID     ?  senna (SENOKOT) tablet 8.6 mg   1 Tab  Oral  DAILY     ?  polyethylene glycol (MIRALAX) packet 17 g   17 g  Oral  DAILY PRN     ?  bisacodyL (DULCOLAX) suppository 10 mg   10 mg  Rectal  DAILY PRN     ?  [Held by provider] hydroCHLOROthiazide (HYDRODIURIL) tablet 25 mg   25 mg  Oral  DAILY     ?  sodium chloride (NS) flush 5-40 mL   5-40 mL  IntraVENous  Q8H     ?  sodium chloride (NS) flush 5-40 mL   5-40 mL  IntraVENous  PRN     ?  promethazine (PHENERGAN) tablet 12.5 mg   12.5 mg  Oral  Q6H PRN          Or           ?  ondansetron (ZOFRAN) injection 4 mg   4 mg  IntraVENous  Q6H PRN     ?  glucose chewable tablet 16 g   4 Tab  Oral  PRN     ?  dextrose (D50W) injection syrg 12.5-25 g   25-50 mL  IntraVENous  PRN           ?  glucagon (GLUCAGEN) injection 1 mg   1 mg  IntraMUSCular  PRN           ?  insulin lispro (HUMALOG) injection     SubCUTAneous  AC&HS     ?  aspirin delayed-release tablet 81 mg   81 mg  Oral  DAILY     ?  pantoprazole (PROTONIX) tablet 40 mg   40 mg  Oral  ACB           ?  enoxaparin (LOVENOX) injection 40 mg   40 mg  SubCUTAneous  Q24H        ______________________________________________________________________   EXPECTED LENGTH OF STAY: 7d 0h   ACTUAL LENGTH OF STAY:          [place], MD

## 2019-12-16 NOTE — Progress Notes (Signed)
Problem: Mobility Impaired (Adult and Pediatric)  Goal: *Acute Goals and Plan of Care (Insert Text)  Description: FUNCTIONAL STATUS PRIOR TO ADMISSION: Patient was independent and active without use of DME.    HOME SUPPORT PRIOR TO ADMISSION: The patient lived alone with no local support.    Physical Therapy  1.  Patient will move from supine to sit and sit to supine , scoot up and down, and roll side to side in bed with supervision/set-up within 7 day(s).  (Improved to stand by assistance).  2.  Patient will transfer from bed to chair and chair to bed with supervision/set-up using the least restrictive device within 7 day(s). (Improved to stand by assistance).  3.  Patient will perform sit to stand with supervision/set-up within 7 day(s). (GOAL MET: update to modified independent).  4.  Patient will ambulate with min A for 50 feet with the least restrictive device within 7 day(s). (Improved to contact guard assistance, only 10 ft).  5.  Patient will ascend/descend 4 stairs with cane and one handrail(s) with supervision/set-up within 7 day(s). (GOAL DISCONTINUED AS she will be going to SNF for Rehab).  6. Patient will be independent with LE exercise program within 7 days.  One goal met and updated. Improved toward remaining goals. Stair goal discontinued as patient is going to SNF for Rehab). They remain appropriate and will continue to work toward same.        Physical Therapy  Reassessed post-surgery 12/06/2019  1.  Patient will move from supine to sit and sit to supine , scoot up and down, and roll side to side in bed with supervision/set-up within 7 day(s).    2.  Patient will transfer from bed to chair and chair to bed with supervision/set-up using the least restrictive device within 7 day(s).  3.  Patient will perform sit to stand with supervision/set-up within 7 day(s).  4.  Patient will ambulate with min A for 50 feet with the least restrictive device within 7 day(s).   5.  Patient will ascend/descend 4  stairs with cane and one handrail(s) with supervision/set-up within 7 day(s).   Goals remain appropriate and will continue to work toward same.      Physical Therapy Goals  Initiated 11/26/2019  1.  Patient will move from supine to sit and sit to supine , scoot up and down, and roll side to side in bed with supervision/set-up within 7 day(s).    2.  Patient will transfer from bed to chair and chair to bed with supervision/set-up using the least restrictive device within 7 day(s).  3.  Patient will perform sit to stand with supervision/set-up within 7 day(s).  4.  Patient will ambulate with supervision/set-up for 50 feet with the least restrictive device within 7 day(s).   5.  Patient will ascend/descend 4 stairs with cane and one handrail(s) with supervision/set-up within 7 day(s).     Outcome: Progressing Towards Goal  PHYSICAL THERAPY REEVALUATION  Patient: Lindsey Huynh (69 y.o. female)  Date: 12/16/2019  Primary Diagnosis: Gangrene (HCC) [I96]  Procedure(s) (LRB):  LEFT TRANSMETATARSAL AMPUTATION WITH TENDON BALANCING (Left) 11 Days Post-Op   Precautions:   PWB(5' PWB but no long distances)      ASSESSMENT  Based on the objective data described below, the patient presents as follows: One goal met and updated. Improved toward remaining goals. Stair goal discontinued as patient is going to SNF for Rehab). They remain appropriate and will continue to work toward same.  Current Level of Function Impacting Discharge (mobility/balance): Stand by assistance to supervison for all mobility. RW, wedge shoe and gait belt for gait.    Functional Outcome Measure:  The patient scored 55/100 on the Barthel outcome measure which is indicative of moderate impaired ability to care for basic self-needs/dependency on others.  Patient is primarily limited due to PWB'ing status on left foot, weight bearing on heel and limitation of distance.      Other factors to consider for discharge: Lives alone/Limited weight bearing/Far  more Independent PLOF  .     Patient will benefit from skilled therapy intervention to address the above noted impairments.       PLAN :  Recommendations and Planned Interventions: bed mobility training, transfer training, gait training, therapeutic exercises, patient and family training/education, and therapeutic activities      Frequency/Duration: Patient will be followed by physical therapy:  5 times a week to address goals.    Recommendation for discharge: (in order for the patient to meet his/her long term goals)  Therapy up to 5 days/week in SNF setting    This discharge recommendation:  Has been made in collaboration with the attending provider and/or case management    Equipment recommendations for successful discharge (if) home: bedside commode and walker: rolling         SUBJECTIVE:   Patient stated "I am feeling much better today."    OBJECTIVE DATA SUMMARY:   HISTORY:    Past Medical History:   Diagnosis Date    Diabetes (HCC)     insulin + metformin    GERD (gastroesophageal reflux disease)     Hypercholesterolemia     Hypertension      Past Surgical History:   Procedure Laterality Date    HX COLONOSCOPY      HX ORTHOPAEDIC  06-30-10    back surgery (tumor removed)    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 Improved toward remaining goals. Stair goal discontinued as patient is going to SNF for Rehab). They remain appropriate and will continue to work toward same.      Personal factors and/or comorbidities impacting plan of care: Lives alone/Limited weight bearing/Far more Independent PLOF      Home Situation  Home Environment: Private residence  # Steps to Enter: 4  Rails to Enter: Yes  Hand Rails : Right  Wheelchair Ramp: No  One/Two Story Residence: One story  Living Alone: Yes  Support Systems: Family member(s)  Patient Expects to be Discharged to:: Rehabilitation facility  Current DME Used/Available at  Home: Cane, straight  Tub or Shower Type: Tub/Shower combination    EXAMINATION/PRESENTATION/DECISION MAKING:   Critical Behavior:  Neurologic State: Alert, Appropriate for age  Orientation Level: Oriented X4, Appropriate for age  Cognition: Appropriate decision making, Appropriate for age attention/concentration, Appropriate safety awareness, Follows commands  Safety/Judgement: Good awareness of safety precautions  Hearing:  Auditory  Auditory Impairment: None    Range Of Motion:  AROM: Generally decreased, functional           PROM: Generally decreased, functional           Strength:    Strength: Generally decreased, functional                    Tone & Sensation:   Tone: Normal  Sensation: Impaired               Coordination:  Coordination: Within functional limits  Vision:      Functional Mobility:  Bed Mobility:     Supine to Sit: Stand-by assistance  Sit to Supine: Stand-by assistance  Scooting: Supervision  Transfers:  Sit to Stand: Supervision  Stand to Sit: Supervision        Bed to Chair: Supervision              Balance:   Sitting: Intact  Sitting - Static: Good (unsupported)  Sitting - Dynamic: Good (unsupported)  Standing: Impaired;Without support  Standing - Static: Good;Constant support  Standing - Dynamic : Fair;Constant support  Ambulation/Gait Training:  Distance (ft): 10 Feet (ft)  Assistive Device: Walker, rolling;Gait belt  Ambulation - Level of Assistance: Contact guard assistance;Stand-by assistance        Gait Abnormalities: Antalgic;Step to gait     Left Side Weight Bearing: Partial (%);Heel(in wedge shoe)  Base of Support: Widened;Shift to right  Stance: Left decreased;Weight shift(PWB on heel)  Speed/Cadence: Slow  Step Length: Right shortened;Left shortened  Swing Pattern: Left asymmetrical     Interventions: Safety awareness training             Therapeutic Exercises:   HEP written on Communication Board    Functional Measure:  Barthel Index:    Bathing: 0  Bladder:  10  Bowels: 10  Grooming: 5  Dressing: 5  Feeding: 10  Mobility: 0  Stairs: 0  Toilet Use: 5  Transfer (Bed to Chair and Back): 10  Total: 55/100       The Barthel ADL Index: Guidelines  1. The index should be used as a record of what a patient does, not as a record of what a patient could do.  2. The main aim is to establish degree of independence from any help, physical or verbal, however minor and for whatever reason.  3. The need for supervision renders the patient not independent.  4. A patient's performance should be established using the best available evidence. Asking the patient, friends/relatives and nurses are the usual sources, but direct observation and common sense are also important. However direct testing is not needed.  5. Usually the patient's performance over the preceding 24-48 hours is important, but occasionally longer periods will be relevant.  6. Middle categories imply that the patient supplies over 50 per cent of the effort.  7. Use of aids to be independent is allowed.    Clarisa Kindred., Barthel, D.W. [phone]). Functional evaluation: the Barthel Index. Md State Med J (14)2.  Zenaida Niece der [place], J.J.M.F, [place], Ian Malkin., Margret Chance., [place], [place]. (1999). Measuring the change indisability after inpatient rehabilitation; comparison of the responsiveness of the Barthel Index and Functional Independence Measure. Journal of Neurology, Neurosurgery, and Psychiatry, 66(4), [phone].  Dawson Bills, N.J.A, [place],  W.J.M, & [place], M.A. (2004.) Assessment of post-stroke quality of life in cost-effectiveness studies: The usefulness of the Barthel Index and the EuroQoL-5D. Quality of Life Research, 13, [phone]             Pain Rating:  0-1/10 with pain medications    Activity Tolerance:   Good    After treatment patient left in no apparent distress:   Sitting in chair, Call bell within reach, Caregiver / family present, and nurse notified. PCT in room changing patient's bed while she sits and  bathes.    COMMUNICATION/EDUCATION:   The patient's  plan of care was discussed with: Occupational therapist, Registered nurse, and Case management and Patient Care Technician.    Fall prevention education was provided and the patient/caregiver indicated understanding., Patient/family have participated as able in goal setting and plan of care., and Patient/family agree to work toward stated goals and plan of care.    Thank you for this referral.  Billey Gosling   Time Calculation: 30 mins

## 2019-12-16 NOTE — Consults (Addendum)
[facility]   [address], [place] [address]  Phone: [phone]   [phone]       NEPHROLOGY CONSULT NOTE     Patient: Lindsey Huynh MRN: [id number]  PCP: Karsten Fells, NP   DOB:     1951/03/27  Age:   69 y.o.  Sex:  female      Referring physician: Cheryll Cockayne, MD  Reason for consultation: 69 y.o. female with Gangrene [facility]) [id number] complicated by ARF  Admission Date: 11/23/2019 12:32 PM  LOS: 23 days      ASSESSMENT and PLAN :   AKI :  - Creatinine rising steeply. UOP not charted   - etiology : VANC + ZOSYN toxicity, relative Hypotension in last 2 days  - ordered renal US, urine lytes and urine eosinophils   - looks dry on exam -- continue IVF   - avoid all potential nephrotoxins   - labs again in am     Left Foot Osteomyelitis  - S/P TMA 3/18   - On IV zosyn. Off IV vanc   - abx per ID      Type II DM     HTN   - hold ARB and thiazide diuretic     GERD    Care Plan discussed with: pt       Thank you for consulting [facility] in the care of your patient.      Subjective:   HPI: Lindsey Huynh is a 69 y.o. African American female with  has a past medical history of Diabetes ([place]), GERD (gastroesophageal reflux disease), Hypercholesterolemia, and Hypertension. who has been admitted to the hospital for left foot cellulitis. She was fouind to have osteomyelitis aof left great toe and she underwent TMA on 3/18. She was on IV vancomycin until 3/22. She was scheduled for d/c on Thursday 3.25 when she spiked fever and d/c was cancelled. She was started on IV vanc and zosyn. She received 2 gm loading dose and 2 gm / day although she was on vanc until 3 days earlier. She also was on cozaar and HCTZ. She developed hypotension in the last 2 days. She reports reduced urine out put for last 3 days . efers have resolved.     Past Medical Hx:   Past Medical History:   Diagnosis Date   ??? Diabetes ([place])     insulin + metformin   ??? GERD (gastroesophageal reflux  disease)    ??? Hypercholesterolemia    ??? Hypertension         Past Surgical Hx:     Past Surgical History:   Procedure Laterality Date   ??? HX COLONOSCOPY     ??? HX ORTHOPAEDIC  06-30-10    back surgery (tumor removed)   ??? 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 Neuromuscular Blockers, Steroidal Other (comments)     GI Upset       Social Hx:  reports that she has quit smoking. She quit after 10.00 years of use. She has never used smokeless tobacco. She reports current alcohol use of about 5.0 standard drinks of alcohol per week. She reports that she does not use drugs.     History reviewed. No pertinent family history.    Review of Systems:  A thorough twelve point review of system was  performed today. Pertinent positives and negatives are mentioned in the HPI. The reminder of the ROS is negative and noncontributory.     Objective:    Vitals:    Vitals:    12/15/19 2017 12/16/19 0232 12/16/19 0821 12/16/19 1447   BP: (!) 94/58 96/68 111/65 115/70   Pulse: 88 82 80 81   Resp: 16 16 16 16    Temp: 98.2 ??F (36.8 ??C) 98 ??F (36.7 ??C) 97.9 ??F (36.6 ??C) 97.6 ??F (36.4 ??C)   SpO2: 96% 96% 97% 97%   Weight:       Height:         I&O's:  No intake/output data recorded.  Visit Vitals  BP 115/70 (BP 1 Location: Left upper arm, BP Patient Position: At rest)   Pulse 81   Temp 97.6 ??F (36.4 ??C)   Resp 16   Ht 5\' 5"  (1.651 m)   Wt 81.9 kg (180 lb 9.6 oz)   SpO2 97%   BMI 30.05 kg/m??       Physical Exam:  General:  No apparent Distress  HEENT: PERRL, + Pallor , No Icterus  Neck: Supple,no mass palpable  Lungs : CTA  CVS: RRR, S1 S2 normal, No murmur   Abdomen: Soft, NT, BS +  Extremities: LLE - foot in dressing and LE edema +  Skin: No rash or lesions.  MS: No joint swelling, erythema, warmth  Neurologic: non focal, AAO x 3  Psych: normal affect/    Laboratory Results:    Recent Labs     12/16/19  0522 12/15/19  0637 12/14/19  0322   NA 134* 134* 133*   K 3.5 3.8 3.9   CL 105 104 104    CO2 18* 22 20*   GLU 110* 122* 145*   BUN 25* 18 12   CREA 2.31* 1.76* 1.04*   CA 8.2* 8.2* 8.3*     Recent Labs     12/16/19  0522 12/15/19  0637 12/14/19  0322   WBC 12.6* 10.9 14.3*   HGB 8.5* 7.9* 8.2*   HCT 25.9* 23.7* 24.2*   PLT 309 307 338     No results found for: SDES  Lab Results   Component Value Date/Time    Culture result: NO GROWTH 4 DAYS 12/12/2019 04:20 PM    Culture result: NO GROWTH 5 DAYS 12/07/2019 07:27 AM    Culture result: NO ANAEROBES ISOLATED 12/05/2019 05:30 PM    Culture result: LIGHT PSEUDOMONAS AERUGINOSA (A) 12/05/2019 05:30 PM     Recent Results (from the past 24 hour(s))   GLUCOSE, POC    Collection Time: 12/15/19  4:40 PM   Result Value Ref Range    Glucose (POC) 146 (H) 65 - 100 mg/dL    Performed by Higinio Plan (CON)    GLUCOSE, POC    Collection Time: 12/15/19  9:06 PM   Result Value Ref Range    Glucose (POC) 149 (H) 65 - 100 mg/dL    Performed by Quin Hoop    CBC WITH AUTOMATED DIFF    Collection Time: 12/16/19  5:22 AM   Result Value Ref Range    WBC 12.6 (H) 3.6 - 11.0 K/uL    RBC 2.82 (L) 3.80 - 5.20 M/uL    HGB 8.5 (L) 11.5 - 16.0 g/dL    HCT 25.9 (L) 35.0 - 47.0 %    MCV 91.8 80.0 - 99.0 FL    MCH 30.1 26.0 - 34.0 PG  MCHC 32.8 30.0 - 36.5 g/dL    RDW 14.4 11.5 - 14.5 %    PLATELET 309 150 - 400 K/uL    MPV 10.4 8.9 - 12.9 FL    NRBC 0.0 0 PER 100 WBC    ABSOLUTE NRBC 0.00 0.00 - 0.01 K/uL    NEUTROPHILS 68 32 - 75 %    LYMPHOCYTES 14 12 - 49 %    MONOCYTES 2 (L) 5 - 13 %    EOSINOPHILS 16 (H) 0 - 7 %    BASOPHILS 0 0 - 1 %    IMMATURE GRANULOCYTES 0 %    ABS. NEUTROPHILS 8.5 (H) 1.8 - 8.0 K/UL    ABS. LYMPHOCYTES 1.8 0.8 - 3.5 K/UL    ABS. MONOCYTES 0.3 0.0 - 1.0 K/UL    ABS. EOSINOPHILS 2.0 (H) 0.0 - 0.4 K/UL    ABS. BASOPHILS 0.0 0.0 - 0.1 K/UL    ABS. IMM. GRANS. 0.0 K/UL    DF MANUAL      RBC COMMENTS POLYCHROMASIA  1+        RBC COMMENTS OVALOCYTES  PRESENT       METABOLIC PANEL, BASIC    Collection Time: 12/16/19  5:22 AM   Result Value Ref Range    Sodium  134 (L) 136 - 145 mmol/L    Potassium 3.5 3.5 - 5.1 mmol/L    Chloride 105 97 - 108 mmol/L    CO2 18 (L) 21 - 32 mmol/L    Anion gap 11 5 - 15 mmol/L    Glucose 110 (H) 65 - 100 mg/dL    BUN 25 (H) 6 - 20 MG/DL    Creatinine 2.31 (H) 0.55 - 1.02 MG/DL    BUN/Creatinine ratio 11 (L) 12 - 20      GFR est AA 25 (L) >60 ml/min/1.76m2    GFR est non-AA 21 (L) >60 ml/min/1.34m2    Calcium 8.2 (L) 8.5 - 10.1 MG/DL   GLUCOSE, POC    Collection Time: 12/16/19  6:41 AM   Result Value Ref Range    Glucose (POC) 120 (H) 65 - 100 mg/dL    Performed by Weldon Inches    GLUCOSE, POC    Collection Time: 12/16/19 11:55 AM   Result Value Ref Range    Glucose (POC) 118 (H) 65 - 100 mg/dL    Performed by Loraine Leriche, RANDOM    Collection Time: 12/16/19  2:41 PM   Result Value Ref Range    Vancomycin, random 18.6 UG/ML         Urine dipstick:   Lab Results   Component Value Date/Time    Color YELLOW/STRAW 12/13/2019 06:24 AM    Appearance CLEAR 12/13/2019 06:24 AM    Specific gravity 1.022 12/13/2019 06:24 AM    pH (UA) 5.5 12/13/2019 06:24 AM    Protein 100 (A) 12/13/2019 06:24 AM    Glucose Negative 12/13/2019 06:24 AM    Ketone Negative 12/13/2019 06:24 AM    Bilirubin Negative 12/13/2019 06:24 AM    Urobilinogen 0.2 12/13/2019 06:24 AM    Nitrites Negative 12/13/2019 06:24 AM    Leukocyte Esterase Negative 12/13/2019 06:24 AM    Epithelial cells FEW 12/13/2019 06:24 AM    Bacteria Negative 12/13/2019 06:24 AM    WBC 0-4 12/13/2019 06:24 AM    RBC 0-5 12/13/2019 06:24 AM       I have reviewed the following:   All pertinent labs,  microbiology data, radiology imaging for my assessment     Medications list Personally Reviewed   [x]       Yes     []                No       Medications:  Prior to Admission medications    Medication Sig Start Date End Date Taking? Authorizing Provider   acetaminophen (TYLENOL) 325 mg tablet Take 2 Tabs by mouth every six (6) hours as needed for Pain. 12/12/19  Yes Madala, Teryl Lucy, MD   docusate  sodium (COLACE) 100 mg capsule Take 1 Cap by mouth two (2) times daily as needed for Constipation for up to 90 days. 12/12/19 03/11/20 Yes Madala, Teryl Lucy, MD   insulin detemir U-100 (Levemir FlexTouch U-100 Insuln) 100 unit/mL (3 mL) inpn 12 Units by SubCUTAneous route daily (with breakfast). 12/12/19  Yes Madala, Teryl Lucy, MD   oxyCODONE IR (ROXICODONE) 5 mg immediate release tablet Take 1 Tab by mouth every six (6) hours as needed for Pain for up to 3 days. Max Daily Amount: 20 mg. 12/12/19 12/15/19 Yes Madala, Teryl Lucy, MD   aspirin delayed-release 81 mg tablet Take 1 Tab by mouth daily. 12/12/19  Yes Madala, Teryl Lucy, MD   metFORMIN ER (GLUCOPHAGE XR) 500 mg tablet TAKE 2 TABLETS BY MOUTH DAILY WITH DINNER 12/06/19   Bobbie Stack T, NP   valsartan-hydroCHLOROthiazide (DIOVAN-HCT) 160-25 mg per tablet TAKE 1 TABLET BY MOUTH DAILY 12/06/19   Bobbie Stack T, NP   amLODIPine (NORVASC) 10 mg tablet Take 1 Tab by mouth daily. 10/15/19   Karsten Fells, NP   gabapentin (NEURONTIN) 300 mg capsule Take 1 Cap by mouth nightly. Max Daily Amount: 300 mg. 10/15/19   Bobbie Stack T, NP   Insulin Needles, Disposable, (Nano Pen Needle) 32 gauge x 5/32" ndle Use with insulin pen 3 times daily 10/15/19   Bobbie Stack T, NP   cyclopentolate (CYCLOGYL) 1 % ophthalmic solution  02/19/18   Provider, Historical   aspirin delayed-release 81 mg tablet Take 81 mg by mouth daily.    Provider, Historical   omeprazole (PRILOSEC) 20 mg capsule Take 20 mg by mouth daily.    Provider, Historical        Thank you for allowing [place] to participate in the care of this patient.   We will follow patient. Please don???t hesitate to call with any questions    Trisha Mangle, [facility]   [address] [place] [address]  Phone - [phone]   Fax - [phone]  [email or web address]

## 2019-12-16 NOTE — Progress Notes (Signed)
Problem: Self Care Deficits Care Plan (Adult)  Goal: *Acute Goals and Plan of Care (Insert Text)  Description: Occupational Therapy Goals  Initiated: 12/12/2019   1.  Patient will perform grooming with supervision/set-up standing for one activity within 7 day(s).  2.  Patient will perform bathing with set-up/supervision from chair within 7 day(s).  3.  Patient will perform upper body dressing and lower body dressing with set-up/supervision within 7 day(s).  4.  Patient will perform toilet transfers with supervision within 7 day(s).  5.  Patient will perform all aspects of toileting with min A within 7 day(s).    FUNCTIONAL STATUS PRIOR TO ADMISSION: Patient was independent and active without use of DME.    HOME SUPPORT: The patient lived alone with no local support.      Outcome: Progressing Towards Goal   OCCUPATIONAL THERAPY TREATMENT  Patient: Lindsey Huynh [id number] y.o. female)  Date: 12/16/2019  Diagnosis: Gangrene ([place]) [I96] <principal problem not specified>  Procedure(s) (LRB):  LEFT TRANSMETATARSAL AMPUTATION WITH TENDON BALANCING (Left) 11 Days Post-Op  Precautions: PWB(5' PWB but no long distances)  Chart, occupational therapy assessment, plan of care, and goals were reviewed.    ASSESSMENT  Patient continues with skilled OT services and is progressing towards goals.  Patient received in bed, had already completed morning ADL tasks and completed toileting with PT prior to OT session.  Patient motivated for therapy.  Completed LB dressing training with SBA with additional time for to don post op shoe.  Completed self care transfer training to chair and back to bed with RW with CGA.  Demonstrated excellent ability to maintain LLE PWB at RW level for basic self care transfer.        Current Level of Function Impacting Discharge (ADLs): self care transfer CGA at RW level, don post op shoe SBA at EOB    Other factors to consider for discharge: Patient lived at home alone and was indep with all ADL tasks.  She has  no local support.  Recommend SNF for additional rehab prior to dc home alone.  Patient in agreement and hopeful to go to SNF this week.          PLAN :  Patient continues to benefit from skilled intervention to address the above impairments.  Continue treatment per established plan of care to address goals.    Recommend with staff: Recommend with nursing, ADLs with supervision/setup, once Egress Test completed then OOB to chair 3x/day and toileting via beside commode CGA-SBA assist and walker. Thank you for completing as able in order to maintain patient strength, endurance and independence.       Recommend next OT session: see goals    Recommendation for discharge: (in order for the patient to meet his/her long term goals)  Therapy up to 5 days/week in SNF setting    This discharge recommendation:  Has been made in collaboration with the attending provider and/or case management    IF patient discharges home will need the following DME: TBD SNF       SUBJECTIVE:   Patient stated ???.???    OBJECTIVE DATA SUMMARY:   Cognitive/Behavioral Status:  Neurologic State: Alert;Appropriate for age  Orientation Level: Oriented X4;Appropriate for age  Cognition: Appropriate decision making;Appropriate for age attention/concentration;Appropriate safety awareness;Follows commands             Functional Mobility and Transfers for ADLs:  Bed Mobility:  Supine to Sit: Stand-by assistance  Sit to Supine: Stand-by assistance  Scooting:  Supervision;Additional time    Transfers:  Sit to Stand: Stand-by assistance     Bed to Chair: Supervision    Balance:  Sitting: Intact  Sitting - Static: Good (unsupported)  Sitting - Dynamic: Good (unsupported)  Standing: Impaired;Without support  Standing - Static: Good;Constant support  Standing - Dynamic : Fair;Constant support    ADL Intervention:     Patient instructed and indicated understanding the benefits of maintaining activity tolerance, functional mobility, and independence with self care  tasks during acute stay  to ensure safe return home and to baseline. Encouraged patient to increase frequency and duration OOB, be out of bed for all meals, perform daily ADLs (as approved by RN/MD regarding bathing etc), and performing functional mobility to/from bathroom.  Provided education through multi-modal cues for RW safety including proper positioning, hand placement, body mechanic and safety.  Patient able to perform sit <> stand with CGA assistance.  Impaired understanding of RW use and safety as she needed additional cues for UE placement from steady surface with sit<> stand, not to pull up on RW.       Lower Body Dressing Assistance  Shoes with Velcro: Contact guard assistance  Leg Crossed Method Used: No-cues for body mechanics, safety at EOB and compensatory techniques            Pain:  Denies pain    Activity Tolerance:   Fair    After treatment patient left in no apparent distress:   Supine in bed, Heels elevated for pressure relief and Call bell within reach    COMMUNICATION/COLLABORATION:   The patient???s plan of care was discussed with: Physical therapist, Registered nurse and Case management.     Seward Grater, OT  Time Calculation: 18 mins

## 2019-12-17 DIAGNOSIS — J3089 Other allergic rhinitis: Secondary | ICD-10-CM | POA: Diagnosis not present

## 2019-12-17 DIAGNOSIS — J301 Allergic rhinitis due to pollen: Secondary | ICD-10-CM | POA: Diagnosis not present

## 2019-12-17 LAB — CBC WITH AUTOMATED DIFF
ABS. BASOPHILS: 0 10*3/uL (ref 0.0–0.1)
ABS. EOSINOPHILS: 2.3 10*3/uL — ABNORMAL HIGH (ref 0.0–0.4)
ABS. IMM. GRANS.: 0 10*3/uL
ABS. LYMPHOCYTES: 1.7 10*3/uL (ref 0.8–3.5)
ABS. MONOCYTES: 0.4 10*3/uL (ref 0.0–1.0)
ABS. NEUTROPHILS: 7.7 10*3/uL (ref 1.8–8.0)
ABSOLUTE NRBC: 0 10*3/uL (ref 0.00–0.01)
BAND NEUTROPHILS: 1 % (ref 0–6)
BASOPHILS: 0 % (ref 0–1)
EOSINOPHILS: 19 % — ABNORMAL HIGH (ref 0–7)
HCT: 25.1 % — ABNORMAL LOW (ref 35.0–47.0)
HGB: 8.3 g/dL — ABNORMAL LOW (ref 11.5–16.0)
IMMATURE GRANULOCYTES: 0 %
LYMPHOCYTES: 14 % (ref 12–49)
MCH: 30.7 PG (ref 26.0–34.0)
MCHC: 33.1 g/dL (ref 30.0–36.5)
MCV: 93 FL (ref 80.0–99.0)
MONOCYTES: 3 % — ABNORMAL LOW (ref 5–13)
MPV: 10.5 FL (ref 8.9–12.9)
NEUTROPHILS: 63 % (ref 32–75)
NRBC: 0 PER 100 WBC
PLATELET: 318 10*3/uL (ref 150–400)
RBC: 2.7 M/uL — ABNORMAL LOW (ref 3.80–5.20)
RDW: 14.4 % (ref 11.5–14.5)
WBC: 12.1 10*3/uL — ABNORMAL HIGH (ref 3.6–11.0)

## 2019-12-17 LAB — METABOLIC PANEL, BASIC
Anion gap: 10 mmol/L (ref 5–15)
BUN/Creatinine ratio: 11 — ABNORMAL LOW (ref 12–20)
BUN: 24 MG/DL — ABNORMAL HIGH (ref 6–20)
CO2: 20 mmol/L — ABNORMAL LOW (ref 21–32)
Calcium: 8.2 MG/DL — ABNORMAL LOW (ref 8.5–10.1)
Chloride: 107 mmol/L (ref 97–108)
Creatinine: 2.09 MG/DL — ABNORMAL HIGH (ref 0.55–1.02)
GFR est AA: 28 mL/min/{1.73_m2} — ABNORMAL LOW (ref >60)
GFR est non-AA: 23 mL/min/{1.73_m2} — ABNORMAL LOW (ref >60)
Glucose: 74 mg/dL (ref 65–100)
Potassium: 3.7 mmol/L (ref 3.5–5.1)
Sodium: 137 mmol/L (ref 136–145)

## 2019-12-17 LAB — SODIUM, UR, RANDOM
Sodium,Ur: 23 MMOL/L
Sodium,urine random: 23 MMOL/L

## 2019-12-17 LAB — CULTURE, BLOOD, PAIRED
Culture result:: NO GROWTH
Culture: NO GROWTH

## 2019-12-17 LAB — GLUCOSE, POC
Glucose (POC): 96 mg/dL (ref 65–100)
Glucose (POC): 74 mg/dL (ref 65–100)
Glucose (POC): 93 mg/dL (ref 65–100)
Glucose (POC): 98 mg/dL (ref 65–100)

## 2019-12-17 LAB — CREATININE, UR, RANDOM
Creatinine, Ur: 114 mg/dL
Creatinine, urine random: 114 mg/dL

## 2019-12-17 LAB — EOSINOPHILS, URINE
Eosinophil, Ur: NEGATIVE
Eosinophils,urine: NEGATIVE

## 2019-12-17 LAB — BASIC METABOLIC PANEL
Anion Gap: 10 mmol/L (ref 5–15)
BUN/Creatinine Ratio: 11 — ABNORMAL LOW (ref 12–20)
BUN: 24 MG/DL — ABNORMAL HIGH (ref 6–20)
CO2: 20 mmol/L — ABNORMAL LOW (ref 21–32)
Calcium: 8.2 MG/DL — ABNORMAL LOW (ref 8.5–10.1)
Chloride: 107 mmol/L (ref 97–108)
Creatinine: 2.09 MG/DL — ABNORMAL HIGH (ref 0.55–1.02)
GFR African American: 28 mL/min/{1.73_m2} — ABNORMAL LOW (ref >60)
Glucose: 74 mg/dL (ref 65–100)
Potassium: 3.7 mmol/L (ref 3.5–5.1)
Sodium: 137 mmol/L (ref 136–145)
eGFR NON-AA: 23 mL/min/{1.73_m2} — ABNORMAL LOW (ref >60)

## 2019-12-17 LAB — POCT GLUCOSE
POC Glucose: 96 mg/dL (ref 65–100)
POC Glucose: 74 mg/dL (ref 65–100)
POC Glucose: 93 mg/dL (ref 65–100)
POC Glucose: 98 mg/dL (ref 65–100)

## 2019-12-17 LAB — CBC WITH AUTO DIFFERENTIAL
Band Neutrophils: 1 % (ref 0–6)
Basophils %: 0 % (ref 0–1)
Basophils Absolute: 0 10*3/uL (ref 0.0–0.1)
Eosinophils %: 19 % — ABNORMAL HIGH (ref 0–7)
Eosinophils Absolute: 2.3 10*3/uL — ABNORMAL HIGH (ref 0.0–0.4)
Granulocyte Absolute Count: 0 10*3/uL
Hematocrit: 25.1 % — ABNORMAL LOW (ref 35.0–47.0)
Hemoglobin: 8.3 g/dL — ABNORMAL LOW (ref 11.5–16.0)
Immature Granulocytes %: 0 %
Lymphocytes %: 14 % (ref 12–49)
Lymphocytes Absolute: 1.7 10*3/uL (ref 0.8–3.5)
MCH: 30.7 PG (ref 26.0–34.0)
MCHC: 33.1 g/dL (ref 30.0–36.5)
MCV: 93 FL (ref 80.0–99.0)
MPV: 10.5 FL (ref 8.9–12.9)
Monocytes %: 3 % — ABNORMAL LOW (ref 5–13)
Monocytes Absolute: 0.4 10*3/uL (ref 0.0–1.0)
NRBC Absolute: 0 10*3/uL (ref 0.00–0.01)
Neutrophils %: 63 % (ref 32–75)
Neutrophils Absolute: 7.7 10*3/uL (ref 1.8–8.0)
Nucleated RBCs: 0 PER 100 WBC
Platelets: 318 10*3/uL (ref 150–400)
RBC: 2.7 M/uL — ABNORMAL LOW (ref 3.80–5.20)
RDW: 14.4 % (ref 11.5–14.5)
WBC: 12.1 10*3/uL — ABNORMAL HIGH (ref 3.6–11.0)

## 2019-12-17 MED FILL — ACETAMINOPHEN 325 MG TABLET: 325 mg | ORAL | Qty: 2

## 2019-12-17 MED FILL — INSULIN GLARGINE 100 UNIT/ML INJECTION: 100 unit/mL | SUBCUTANEOUS | Qty: 1

## 2019-12-17 MED FILL — LOVENOX 30 MG/0.3 ML SUB-Q SYRINGE: 30 mg/0.3 mL | SUBCUTANEOUS | Qty: 0.3

## 2019-12-17 MED FILL — GABAPENTIN 100 MG CAP: 100 mg | ORAL | Qty: 1

## 2019-12-17 MED FILL — LACTULOSE 20 GRAM/30 ML ORAL SOLUTION: 20 gram/30 mL | ORAL | Qty: 30

## 2019-12-17 MED FILL — PANTOPRAZOLE 40 MG TAB, DELAYED RELEASE: 40 mg | ORAL | Qty: 1

## 2019-12-17 MED FILL — OXYCODONE 5 MG TAB: 5 mg | ORAL | Qty: 1

## 2019-12-17 MED FILL — GABAPENTIN 300 MG CAP: 300 mg | ORAL | Qty: 1

## 2019-12-17 MED FILL — DOK 100 MG CAPSULE: 100 mg | ORAL | Qty: 1

## 2019-12-17 MED FILL — ASPIRIN 81 MG TAB, DELAYED RELEASE: 81 mg | ORAL | Qty: 1

## 2019-12-17 MED FILL — AMLODIPINE 5 MG TAB: 5 mg | ORAL | Qty: 1

## 2019-12-17 MED FILL — SENNA LAX 8.6 MG TABLET: 8.6 mg | ORAL | Qty: 1

## 2019-12-17 NOTE — Progress Notes (Signed)
ID Progress Note  12/17/2019       Left tma 3/18    Subjective:       Afebrile. No complaints.     Objective:     Vitals:   Visit Vitals  BP 126/65 (BP 1 Location: Left upper arm, BP Patient Position: At rest)   Pulse 86   Temp 98.4 ??F (36.9 ??C)   Resp 16   Ht 5' 5"  (1.651 m)   Wt 81.9 kg (180 lb 9.6 oz)   SpO2 98%   BMI 30.05 kg/m??        Tmax:  Temp (24hrs), Avg:98 ??F (36.7 ??C), Min:97.6 ??F (36.4 ??C), Max:98.5 ??F (36.9 ??C)      Exam:    Not in distress  Lung clear, no rales, wheezes or rhonchi   Heart: s1, s2, RRR, no murmurs rubs or clicks  Abdomen: soft nontender, no guarding or rebound  Left foot  bandaged.     Labs:   Lab Results   Component Value Date/Time    WBC 12.1 (H) 12/17/2019 01:07 AM    HGB 8.3 (L) 12/17/2019 01:07 AM    HCT 25.1 (L) 12/17/2019 01:07 AM    PLATELET 318 12/17/2019 01:07 AM    MCV 93.0 12/17/2019 01:07 AM     Lab Results   Component Value Date/Time    Sodium 137 12/17/2019 01:07 AM    Potassium 3.7 12/17/2019 01:07 AM    Chloride 107 12/17/2019 01:07 AM    CO2 20 (L) 12/17/2019 01:07 AM    Anion gap 10 12/17/2019 01:07 AM    Glucose 74 12/17/2019 01:07 AM    BUN 24 (H) 12/17/2019 01:07 AM    Creatinine 2.09 (H) 12/17/2019 01:07 AM    BUN/Creatinine ratio 11 (L) 12/17/2019 01:07 AM    GFR est AA 28 (L) 12/17/2019 01:07 AM    GFR est non-AA 23 (L) 12/17/2019 01:07 AM    Calcium 8.2 (L) 12/17/2019 01:07 AM    Bilirubin, total 0.2 11/23/2019 01:32 PM    Alk. 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  #2 hyperlipidemia  ??  #3 hypertension  ??  #4 diabetes  ??  #5 new onset fever - resolved    #6 AKI     Recommendations:       Had TMA 3/18. Pathology of proximal margin shows no osteo.     She has AKI. Blood eos  getting higher. Fevers resolved and cultures negative. MOnitor off abx.       Jonatha Gagen Sallyanne Havers, MD

## 2019-12-17 NOTE — Progress Notes (Signed)
[facility]                                                                                          Hospitalist Progress Note  Cheryll Cockayne, MD  Answering service: [phone] OR [phone] from in house phone        Date of Service:  12/17/2019  NAME:  Lindsey Huynh  DOB:  06-15-1951  MRN:  [id number]      Admission Summary:     "Vasti Schnake??is a 69 y.o.??female??with past medical history of diabetes, insulin-dependent, GERD and hypertension comes for return of left foot pain and swelling.    Interval history / Subjective:     F/U for left foot cellulitis, left great toe gangrene  Patient is seen and examined at bedside this AM. Feels ok. No overnight events or new complaints. No fevers.  Explained cr worsened due to ABx, now improving   Discussed with nursing and nephrology Dr. 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 First distal phalanx osteomyelitis.  - completed cefepime. Appreciate ID input.   - surgical margin clear  - Vascular surgery following- Patient to follow up in 2 weeks post- op  - appreciate Podiatry input   - c/w aspirin   ??  Fever 3/25 and 3/26  - normal wbc, lactic, procalcitonin  - UA clean. CXR: normal  - rapid covid and pcr negative   - blood cx : NGTD  - surgical site - no drainage  - appreciate ID input  - leucocytosis on 3/27   - discontinued vanco 3/28 and zosyn 3/29  - afebrile >48hrs  - monitor off abx     AKI - cr improving   - ATN due to abx  - renal US normal   - s/p IVF  - appreciate nephrology input  - will monitor renal function     Anemia   -  No signs of bleeding noted. hgb low stable   - Monitor labs and transfuse if hgb <7.0??  ??  DMII:??hold metformin while IP  -Cont basal insulin, SSI  -A1c 8.1  - DM education   ??  HTN  -Continue amlodipine.  holding Cozaar and HCTZ due to soft BP   ??  GERD  -Cont PPI    Code status: Full   DVT prophylaxis: Lovenox     Care Plan discussed with: Patient/Family, Nurse and Case Manager  Anticipated Disposition: TBD. Possible dc tomorrow   Anticipated Discharge: [facility] Problems  Date Reviewed: 06-07-2018          Codes Class Noted POA    Gangrene [facility]) ICD-10-CM: [id number]  ICD-9-CM: 785.4  11/23/2019 Unknown                Review of Systems:   A comprehensive review of systems was negative except for that written in the HPI.  Xr Foot Lt Ap/lat    Result Date: 12/05/2019  1. Transmetatarsal amputation.    Xr Foot Lt Min 3 V    Result Date: 11/23/2019  No acute abnormality.    Mri Low Ext Lt Wo Cont    Result Date: 12/01/2019  1. First distal phalanx osteomyelitis. 2. No abscess.     Ct Low Ext Lt W Cont    Result Date: 11/24/2019  No CT imaging evidence for soft tissue gas, abscess or osteomyelitis.    [place] Retroperitoneum Comp    Result Date: 12/16/2019  Kidneys are within normal limits. There is no hydronephrosis.     Xr Chest Port    Result Date: 12/12/2019  No evidence of acute cardiopulmonary process.     Retail banker Technologist Service    Result Date: 11/25/2019  FLUOROSCOPY WAS USED. Fluoro Dose:  144.09 mGy. vk    Vital Signs:    Last 24hrs VS reviewed since prior progress note. Most recent are:  Visit Vitals  BP 126/65 (BP 1 Location: Left upper arm, BP Patient Position: At rest)   Pulse 86   Temp 98.4 ??F (36.9 ??C)   Resp 16   Ht 5\' 5"  (1.651 m)   Wt 81.9 kg (180 lb 9.6 oz)   SpO2 98%   BMI 30.05 kg/m??         Intake/Output Summary (Last 24 hours) at 12/17/2019 1402  Last data filed at 12/17/2019 0441  Gross per 24 hour   Intake 2050 ml   Output 700 ml   Net 1350 ml        Physical Examination:             Constitutional:  No acute distress, cooperative   ENT:  Oral mucosa moist, oropharynx benign.    Resp:  CTA bilaterally.   CV:  Regular rhythm, normal rate,     GI:  Soft, non distended, non tender.bs+     Musculoskeletal:  LLE dressing    Neurologic:  Moves all extremities.  AAOx3, CN II-XII reviewed, follows commands     Psych:  Good insight, Not anxious nor agitated.     ??            Data Review:    Review and/or order of clinical lab test  Review and/or order of tests in the medicine section of CPT      Labs:     Recent Labs     12/17/19  0107 12/16/19  0522   WBC 12.1* 12.6*   HGB 8.3* 8.5*   HCT 25.1* 25.9*   PLT 318 309     Recent Labs     12/17/19  0107 12/16/19  0522 12/15/19  0637   NA 137 134* 134*   K 3.7 3.5 3.8   CL 107 105 104   CO2 20* 18* 22   BUN 24* 25* 18   CREA 2.09* 2.31* 1.76*   GLU 74 110* 122*   CA 8.2* 8.2* 8.2*     No results for input(s): ALT, AP, TBIL, TBILI, TP, ALB, GLOB, GGT, AML, LPSE in the last 72 hours.    No lab exists for component: SGOT, GPT, AMYP, HLPSE  No results for input(s): INR, PTP, APTT, INREXT, INREXT in the last 72 hours.   No results for input(s): FE, TIBC, PSAT, FERR in the last 72 hours.   No results found for: FOL, RBCF   No results for input(s): PH, PCO2, PO2 in the  last 72 hours.  No results for input(s): CPK, CKNDX, TROIQ in the last 72 hours.    No lab exists for component: CPKMB  Lab Results   Component Value Date/Time    Cholesterol, total 219 (H) 10/30/2018 10:27 AM    HDL Cholesterol 63 10/30/2018 10:27 AM    LDL, calculated 130 (H) 10/30/2018 10:27 AM    Triglyceride 131 10/30/2018 10:27 AM    CHOL/HDL Ratio 3.4 12/26/2007 09:12 AM     Lab Results   Component Value Date/Time    Glucose (POC) 93 12/17/2019 11:52 AM    Glucose (POC) 74 12/17/2019 05:49 AM    Glucose (POC) 96 12/16/2019 09:37 PM    Glucose (POC) 155 (H) 12/16/2019 04:28 PM    Glucose (POC) 118 (H) 12/16/2019 11:55 AM     Lab Results   Component Value Date/Time    Color YELLOW/STRAW 12/13/2019 06:24 AM    Appearance CLEAR 12/13/2019 06:24 AM    Specific gravity 1.022 12/13/2019 06:24 AM    pH (UA) 5.5 12/13/2019 06:24 AM    Protein 100 (A) 12/13/2019 06:24 AM    Glucose Negative 12/13/2019  06:24 AM    Ketone Negative 12/13/2019 06:24 AM    Bilirubin Negative 12/13/2019 06:24 AM    Urobilinogen 0.2 12/13/2019 06:24 AM    Nitrites Negative 12/13/2019 06:24 AM    Leukocyte Esterase Negative 12/13/2019 06:24 AM    Epithelial cells FEW 12/13/2019 06:24 AM    Bacteria Negative 12/13/2019 06:24 AM    WBC 0-4 12/13/2019 06:24 AM    RBC 0-5 12/13/2019 06:24 AM         Medications Reviewed:     Current Facility-Administered Medications   Medication Dose Route Frequency   ??? enoxaparin (LOVENOX) injection 30 mg  30 mg SubCUTAneous Q24H   ??? acetaminophen (TYLENOL) suppository 650 mg  650 mg Rectal Q6H PRN   ??? amLODIPine (NORVASC) tablet 5 mg  5 mg Oral DAILY   ??? [Held by provider] losartan (COZAAR) tablet 50 mg  50 mg Oral DAILY   ??? lactulose (CHRONULAC) 10 gram/15 mL solution 30 mL  20 g Oral BID   ??? alteplase (CATHFLO) 1 mg in sterile water (preservative free) 1 mL injection  1 mg InterCATHeter PRN   ??? insulin glargine (LANTUS) injection 12 Units  12 Units SubCUTAneous QHS   ??? acetaminophen (TYLENOL) tablet 650 mg  650 mg Oral Q6H   ??? gabapentin (NEURONTIN) capsule 300 mg  300 mg Oral QHS   ??? gabapentin (NEURONTIN) capsule 100 mg  100 mg Oral DAILY   ??? sodium chloride (NS) flush 5-40 mL  5-40 mL IntraVENous Q8H   ??? sodium chloride (NS) flush 5-40 mL  5-40 mL IntraVENous PRN   ??? sodium phosphate (FLEET'S) enema 1 Enema  1 Enema Rectal PRN   ??? oxyCODONE IR (ROXICODONE) tablet 5 mg  5 mg Oral Q4H PRN   ??? docusate sodium (COLACE) capsule 100 mg  100 mg Oral BID   ??? senna (SENOKOT) tablet 8.6 mg  1 Tab Oral DAILY   ??? polyethylene glycol (MIRALAX) packet 17 g  17 g Oral DAILY PRN   ??? bisacodyL (DULCOLAX) suppository 10 mg  10 mg Rectal DAILY PRN   ??? [Held by provider] hydroCHLOROthiazide (HYDRODIURIL) tablet 25 mg  25 mg Oral DAILY   ??? sodium chloride (NS) flush 5-40 mL  5-40 mL IntraVENous Q8H   ??? sodium chloride (NS) flush 5-40 mL  5-40 mL IntraVENous PRN   ???  promethazine (PHENERGAN) tablet 12.5 mg  12.5 mg Oral Q6H  PRN    Or   ??? ondansetron (ZOFRAN) injection 4 mg  4 mg IntraVENous Q6H PRN   ??? glucose chewable tablet 16 g  4 Tab Oral PRN   ??? dextrose (D50W) injection syrg 12.5-25 g  25-50 mL IntraVENous PRN   ??? glucagon (GLUCAGEN) injection 1 mg  1 mg IntraMUSCular PRN   ??? insulin lispro (HUMALOG) injection   SubCUTAneous AC&HS   ??? aspirin delayed-release tablet 81 mg  81 mg Oral DAILY   ??? pantoprazole (PROTONIX) tablet 40 mg  40 mg Oral ACB     ______________________________________________________________________  EXPECTED LENGTH OF STAY: 7d 0h  ACTUAL LENGTH OF STAY:          24                 Cheryll Cockayne, MD

## 2019-12-17 NOTE — Progress Notes (Signed)
TOC: Auth restarted yesterday with [facility] per [place] (liaison). Discharge plan for tomorrow if MD clears and Auth approved. BLS to transport and [facility] on will call. CM following for discharge needs.    RUR: 26%    Nathanial Millman RN/CRM

## 2019-12-17 NOTE — Progress Notes (Signed)
Francoise Schaumann, DPM - Greig Castilla K. 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,  pathology proximal margin negative    Post operative recommendations:   Pt can partial weight bear for short distances <5 steps in surgical shoe left foot  Dressings with adaptic, betadine, dry sterile dressing left foot, betadine and bandaid to ulcerations left calf; change 2-3 x /week  Surgical site appears to be healing with adequate perfusion  Pt is stable for discharge from a podiatry standpoint- planning d/c to SNF [facility])  ID following - Cefepime course completed, proximal margin without osteomyelitis  Pt to f/u with me 1 week after discharge for close post op f/u    Do not hesitate to contact us via [employer] or phone (personal [phone]) with any questions.    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  Musculoskeletal: no joint / extremity pain, misalignment, stiffness, decreased ROM, crepitus.  Integument: No pruritis, rashes, lesions, left foot and leg wounds.  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 Neuromuscular Blockers, Steroidal Other (comments)     GI Upset     History reviewed. No pertinent family history.   @HXMEDICAL @  Past Surgical History:   Procedure Laterality Date   ??? HX COLONOSCOPY     ??? HX ORTHOPAEDIC  06-30-10    back surgery (tumor removed)   ??? 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 Smoking status: Former Smoker     Years: 10.00   ??? Smokeless tobacco: Never Used   Substance Use Topics   ??? Alcohol use: Yes     Alcohol/week: 5.0 standard drinks     Types: 6 Cans of beer per week       Social History     Substance and Sexual Activity   Alcohol Use Yes   ??? Alcohol/week: 5.0 standard drinks   ??? Types: 6 Cans of beer per week     Social History     Substance and Sexual Activity   Drug Use No      Social History     Tobacco  Use   Smoking Status Former Smoker   ??? Years: 10.00   Smokeless Tobacco Never Used     Current Facility-Administered Medications   Medication Dose Route Frequency   ??? enoxaparin (LOVENOX) injection 30 mg  30 mg SubCUTAneous Q24H   ??? acetaminophen (TYLENOL) suppository 650 mg  650 mg Rectal Q6H PRN   ??? amLODIPine (NORVASC) tablet 5 mg  5 mg Oral DAILY   ??? [Held by provider] losartan (COZAAR) tablet 50 mg  50 mg Oral DAILY   ??? lactulose (CHRONULAC) 10 gram/15 mL solution 30 mL  20 g Oral BID   ??? alteplase (CATHFLO) 1 mg in sterile water (preservative free) 1 mL injection  1 mg InterCATHeter PRN   ??? insulin glargine (LANTUS) injection 12 Units  12 Units SubCUTAneous QHS   ??? acetaminophen (TYLENOL) tablet 650 mg  650 mg Oral Q6H   ??? gabapentin (NEURONTIN) capsule 300 mg  300 mg Oral QHS   ??? gabapentin (NEURONTIN) capsule 100 mg  100 mg Oral DAILY   ??? sodium chloride (NS) flush 5-40 mL  5-40 mL IntraVENous Q8H   ??? sodium chloride (NS) flush 5-40 mL  5-40 mL IntraVENous PRN   ??? sodium phosphate (FLEET'S) enema 1 Enema  1 Enema Rectal PRN   ??? oxyCODONE IR (ROXICODONE) tablet  5 mg  5 mg Oral Q4H PRN   ??? docusate sodium (COLACE) capsule 100 mg  100 mg Oral BID   ??? senna (SENOKOT) tablet 8.6 mg  1 Tab Oral DAILY   ??? polyethylene glycol (MIRALAX) packet 17 g  17 g Oral DAILY PRN   ??? bisacodyL (DULCOLAX) suppository 10 mg  10 mg Rectal DAILY PRN   ??? [Held by provider] hydroCHLOROthiazide (HYDRODIURIL) tablet 25 mg  25 mg Oral DAILY   ??? sodium chloride (NS) flush 5-40 mL  5-40 mL IntraVENous Q8H   ??? sodium chloride (NS) flush 5-40 mL  5-40 mL IntraVENous PRN   ??? promethazine (PHENERGAN) tablet 12.5 mg  12.5 mg Oral Q6H PRN    Or   ??? ondansetron (ZOFRAN) injection 4 mg  4 mg IntraVENous Q6H PRN   ??? glucose chewable tablet 16 g  4 Tab Oral PRN   ??? dextrose (D50W) injection syrg 12.5-25 g  25-50 mL IntraVENous PRN   ??? glucagon (GLUCAGEN) injection 1 mg  1 mg IntraMUSCular PRN   ??? insulin lispro (HUMALOG) injection   SubCUTAneous AC&HS   ??? aspirin delayed-release tablet 81 mg  81 mg Oral DAILY   ??? pantoprazole (PROTONIX) tablet 40 mg  40 mg Oral ACB        Objective:  Vitals:   Patient Vitals for the past 12 hrs:   BP Temp Pulse Resp SpO2   12/18/19 0425 124/72 98.2 ??F (36.8 ??C) 79 16 97 %       Vascular:  B/L LE  DP 0/4; PT 0/4  capillary fill time brisk, pitting edema is present, skin temperature is cool, varicosities are present.    Dermatological:  Nails are thickened, discolored, painful to palpation, 59mm thick, with subungual debris.          There is no maceration of the interspaces of the feet b/l.  No focal hyperkeratosis    Wound: 1   Location: surgical incisions left forefoot and posterior Achilles  Margins: approximated with sutures intact  Drainage: none  Odor: none  Wound base: skin edges granular  Lymphangitic streaking? No.  Undermining? No.  Sinus tracts? No.  Exposed  bone? No.  Subcutaneous crepitation on palpation? No.  No necrosis noted      Neurological:  DTR are present, protective sensation per 5.07 Semmes Weinstein monofilament is absent, patient is AAOx3, mood is normal.  Epicritic sensation is intact.    Orthopedic:  B/L LE are symmetric, ROM of ankle, STJ, 1st MTPJ is limited, MMT 5 out of 5 for B/L LE.  No pedal amputations noted    Constitutional: Pt is a well developed elderly female.        Imaging:  Exam Information    Status Exam Begun  Exam Ended    Final [99] 12/05/2019 21:04 12/05/2019 ??9:08 PM [id number] ??9:08 PM   Result Information    Status: Final result (Exam End: 12/05/2019 21:08) Provider Status: Open   Study Result    EXAM: XR FOOT LT AP/LAT  ??  INDICATION: post op xray s/p transmetatarsal amputation.  ??  COMPARISON: None.  ??  FINDINGS: Two views of the left foot demonstrate transmetatarsal amputation. No  evidence of immediate complication.  ??  IMPRESSION  1. Transmetatarsal amputation.     Exam Information    Status Exam Begun  Exam Ended    Final [99] 11/29/2019 20:49 11/29/2019 ??9:17 PM [id number] ??9:17 PM   Result Information    Status: Final result (Exam End: 11/29/2019 21:17) Provider Status: Open   Study Result    PRELIMINARY REPORT  Soft tissue defect on the dorsal aspect of the first distal phalanx with  underlying marrow edema as seen on the STIR images.  ??  ??  Preliminary report was provided by Dr. Art Buff, the on-call radiologist, at  21:41 hours on 11/29/2019  ??  Final report to follow.  ??  FINAL REPORT BELOW  ??  EXAM:  MRI LOW EXT LT WO CONT  ??  INDICATION: Left foot infection. Diabetes.  ??  COMPARISON: CT left foot on 11/23/2019.  ??  TECHNIQUE: Axial, coronal, and sagittal MRI of the left forefoot in the T1 and  inversion recovery pulse sequences with and without fat saturation .  ??  CONTRAST: None.  ??  FINDINGS:   ??  Bone marrow: Subtle cortical loss of the great toe distal phalanx. Bone marrow  edema is in the great toe distal phalanx.  ??  Remaining bone marrow signal is heterogeneous but within normal limits. No acute  fracture or osteonecrosis.  ??  Joint fluid: Physiologic.  ??  Tendons: Intact.  ??  Muscles: Mild atrophy. Moderate edema.  ??  Neurovascular  bundles: No neuroma. Mild first and third intermetatarsal  bursitis.  ??  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 No septic  arthritis. Normal Lisfranc joint.  ??  Soft tissue mass: Mild cellulitis of the toes. No abscess.  ??  IMPRESSION  ??  1. First distal phalanx osteomyelitis.  2. No abscess.       Exam Information    Status Exam Begun  Exam Ended    Final [99] 11/23/2019 12:48 11/23/2019 12:52 PM [id number] 12:52 PM   Result Information    Status: Final result (Exam End: 11/23/2019 12:52) Provider Status: Open   Study Result    EXAM: XR FOOT LT MIN 3 V  ??  INDICATION: left foot pain.  ??  COMPARISON: None.  ??  FINDINGS: Three views of the left foot demonstrate no fracture or other acute  osseous or articular abnormality. The soft tissues are within normal limits.  Mild osteophytic changes noted, most prominent at  the first metatarsophalangeal  joint.  ??  IMPRESSION  No acute abnormality.       11/28/2019  PROCEDURE PERFORMED:  Left mid superficial femoral artery to proximal peroneal artery bypass using nonreversed saphenous vein.    11/25/2019  PROCEDURE PERFORMED:  1.  Abdominal aortogram.  2.  Left leg runoff arteriogram with third order catheterization.  ??  SURGEON:  Belenda Cruise, MD  FINDINGS:  1.  Aortoiliac arteriogram:  The abdominal aorta and iliac arteries are patent without narrowing.  2.  Left leg runoff arteriogram:  The common femoral, profunda femoris, and superficial femoral arteries are patent.  There is no significant stenosis in the superficial femoral artery.  The SFA is occluded at its distal portion at the junction with the popliteal artery.  The tibioperoneal trunk reconstitutes with two-vessel posterior tibial and peroneal outflow into the lower leg and foot.  The posterior tibial is a small diffusely narrow artery.  The peroneal artery is the best runoff vessel.  ??  SUMMARY:  Left popliteal occlusion with reconstitution of the tibioperoneal trunk with two-vessel  runoff to the lower leg and foot.  The patient will need bypass surgery for limb salvage.      Noninvasive vascular studies   11/26/2019  Interpretation Summary    ?? Pressure measurements at the ankle are consistent with severe, bilateral, lower extremity arterial obstruction.      Lower Extremity Arterial Findings    ABI    ABI is severely abnormal on the right and the left.  PVR waveforms at the ankle are moderately to severely abnormal on the right and left.  PPG waveforms are flat (non-pulsatile) at the right and left great toe.     Results     Procedure Component Value Units Date/Time    CULTURE, BLOOD, PAIRED [id number] Collected: 12/12/19 1620    Order Status: Completed Specimen: Blood Updated: 12/17/19 0537     Special Requests: NO SPECIAL REQUESTS        Culture result: NO GROWTH 5 DAYS       COVID-19 RAPID TEST [id number] Collected: 12/12/19 1545    Order Status: Completed Specimen: Nasopharyngeal Updated: 12/12/19 1640     Specimen source Nasopharyngeal        COVID-19 rapid test Not detected        Comment: Rapid Abbott ID Now       Rapid NAAT:  The specimen is NEGATIVE for SARS-CoV-2, the novel coronavirus associated with COVID-19.       Negative results should be treated as presumptive and, if inconsistent with clinical signs and symptoms or necessary for patient management, should be tested with an alternative molecular assay.  Negative results do not preclude SARS-CoV-2 infection and should not be used as the sole basis for patient management decisions.       This test has been authorized by the FDA under an Emergency Use Authorization (EUA) for use by authorized laboratories.   Fact sheet for Healthcare Providers: LittleDVDs.dk  Fact sheet for Patients: SatelliteRebate.it       Methodology: Isothermal Nucleic Acid Amplification         CULTURE, BLOOD, PAIRED [id number] Collected: 12/07/19 0727    Order Status: Completed Specimen: Blood Updated:  12/12/19 0630     Special Requests: NO SPECIAL REQUESTS        Culture result: NO GROWTH 5 DAYS       CULTURE, BLOOD, PAIRED [id number] Collected: 12/07/19 0700    Order Status: Canceled Specimen: Blood  CULTURE, BLOOD, PAIRED [id number] Collected: 12/07/19 0700    Order Status: Canceled Specimen: Blood     RESPIRATORY VIRUS PANEL W/COVID-19, PCR [id number] Collected: 12/06/19 1125    Order Status: Completed Specimen: Nasopharyngeal Updated: 12/06/19 1248     Adenovirus Not detected        Coronavirus 229E Not detected        Coronavirus HKU1 Not detected        Coronavirus CVNL63 Not detected        Coronavirus OC43 Not detected        Metapneumovirus Not detected        Rhinovirus and Enterovirus Not detected        Influenza A Not detected        Influenza A, subtype H1 Not detected        Influenza A, subtype H3 Not detected        INFLUENZA A H1N1 PCR Not detected        Influenza B Not detected        Parainfluenza 1 Not detected        Parainfluenza 2 Not detected        Parainfluenza 3 Not detected        Parainfluenza virus 4 Not detected        RSV by PCR Not detected        B. parapertussis, PCR Not detected        Bordetella pertussis - PCR Not detected        Chlamydophila pneumoniae DNA, QL, PCR Not detected        Mycoplasma pneumoniae DNA, QL, PCR Not detected        SARS-CoV-2, PCR Not detected       RESPIRATORY VIRUS PANEL W/COVID-19, PCR [id number]     Order Status: Canceled Specimen: NASOPHARYNGEAL SWAB     CULTURE, ANAEROBIC [id number] Collected: 12/05/19 1730    Order Status: Completed Specimen: Foot, left Updated: 12/08/19 0920     Special Requests: NO SPECIAL REQUESTS        Culture result: NO ANAEROBES ISOLATED       CULTURE, TISSUE Sid Falcon STAIN [id number]  (Abnormal)  (Susceptibility) Collected: 12/05/19 1730    Order Status: Completed Specimen: Foot, left Updated: 12/08/19 0920     Special Requests: DISTAL PHALANX BONE     GRAM STAIN RARE GRAM NEGATIVE RODS        Culture result:        LIGHT PSEUDOMONAS AERUGINOSA          Susceptibility      Pseudomonas aeruginosa     MIC     Amikacin ($) Susceptible     Cefepime ($$) Susceptible     Ceftazidime ($) Susceptible     Ciprofloxacin ($) Susceptible     Gentamicin ($) Susceptible     Levofloxacin ($) Susceptible     Meropenem ($$) Susceptible     Piperacillin/Tazobac ($) Susceptible [1]      Tobramycin ($) Susceptible            [1]   **FDA INTERPRETATION REFLECTED, REFER TO CLSI FOR ALTERNATE INTERPRETATIONS.**                     PATHOLOGY     FINAL PATHOLOGIC DIAGNOSIS   1. Bone, proximal margin left 1st ray:   Portion of bone with no evidence of active osteomyelitis.   2. Left forefoot, transmetatarsal amputation:   Ulceration with necrosis and acute  inflammation extending into underlying bone consistent with active osteomyelitis.       Labs:  Recent Labs     12/18/19  0357   WBC 7.4   CREA 1.75*   BUN 20   HGB 6.9*   HCT 20.8*   NA 140   K 3.6   CL 113*   CO2 20*   GLU 79

## 2019-12-17 NOTE — Progress Notes (Signed)
Bedside shift change report given to Aniceto Boss, Therapist, sports (oncoming nurse) by Lilia Pro, RN (offgoing nurse). Report included the following information SBAR.

## 2019-12-17 NOTE — Progress Notes (Signed)
Problem: Self Care Deficits Care Plan (Adult)  Goal: *Acute Goals and Plan of Care (Insert Text)  Description: Occupational Therapy Goals  Initiated: 12/12/2019   1.  Patient will perform grooming with supervision/set-up standing for one activity within 7 day(s).  2.  Patient will perform bathing with set-up/supervision from chair within 7 day(s).  3.  Patient will perform upper body dressing and lower body dressing with set-up/supervision within 7 day(s).  4.  Patient will perform toilet transfers with supervision within 7 day(s).  5.  Patient will perform all aspects of toileting with min A within 7 day(s).      FUNCTIONAL STATUS PRIOR TO ADMISSION: Patient was independent and active without use of DME.    HOME SUPPORT: The patient lived alone with no local support.      Outcome: Progressing Towards Goal     OCCUPATIONAL THERAPY TREATMENT  Patient: Lindsey Huynh [id number] y.o. 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    ASSESSMENT  Patient continues with skilled OT services and is progressing towards goals.  Pt was able to progress from bed to [facility] with stand by assistance.  Pt provided extensive encouragement to discontinue use of purewick during day hours to maximize independence with all activities.  She transfers well at stand by assistance level needing only additional time.  Please strongly encourage use of BSC during the day with agreement she can use purewick at night.  Pt continent of bladder at home.  Recommend discharge to rehab setting as she is still below her functional baseline. Pt will benefit form discharge to rehab setting to maximize independence with ADL activities.      Current Level of Function Impacting Discharge (ADLs):  debility, preference for dependence    Other factors to consider for discharge: see nursing notes         PLAN :  Patient continues to benefit from skilled intervention to address the above impairments.  Continue treatment per established plan of care to address goals.    Recommend with staff: encouraged BSC use during the day    Recommend next OT session: toileting, dressing, bathing, grooming.  Enocurage OOB to chair    Recommendation for discharge: (in order for the patient to meet his/her long term goals)  Therapy up to 5 days/week in SNF setting    This discharge recommendation:  Has been made in collaboration with the attending provider and/or case management    IF patient discharges home will need the following DME: TBD       SUBJECTIVE:   Patient stated ???You are taking that away.???    OBJECTIVE DATA SUMMARY:   Cognitive/Behavioral Status:  Neurologic State: Alert  Orientation Level: Oriented X4  Cognition: Appropriate for age attention/concentration  Perception: Appears intact  Perseveration: No perseveration noted  Safety/Judgement: Good awareness of safety precautions    Functional Mobility and Transfers for ADLs:  Bed Mobility:  Supine to Sit: Supervision  Sit to Supine: Supervision    Transfers:  Sit to Stand: Stand-by assistance  Functional Transfers  Bathroom Mobility: (BSC only due to limited distance 5' only per MD)  Toilet Transfer : Stand-by assistance(using RW)       Balance:  Sitting: Intact  Sitting - Static: Good (unsupported)  Sitting - Dynamic:  Good (unsupported)  Standing: Impaired;With support  Standing - Static: Constant support;Fair  Standing - Dynamic : Constant support;Fair    ADL Intervention:   Pt received in bed.  She progressed to the [facility] with cues and education for use of walker for transfers.  She overall did very well with all activities.  She was able to have a bowel movement.  Completed hygiene while sitting with setup.  Pt then needed max A for clothing management.  Pt returned  to bed.  Provided encouragement to avoid use of purewick during day hours and to progress to the [facility].  She is resistant but then in agreement following encouragement.                              Toileting  Toileting Assistance: Minimum assistance  Bladder Hygiene: Supervision  Bowel Hygiene: Supervision  Clothing Management: Maximum assistance(due to brief and purewick)  Cues: Verbal cues provided(strongly enocuraged to discontinue purewick)    Cognitive Retraining  Safety/Judgement: Good awareness of safety precautions    Pain:  No pain    Activity Tolerance:   Good and requires rest breaks    After treatment patient left in no apparent distress:   Supine in bed, Heels elevated for pressure relief, Call bell within reach, and Side rails x 3    COMMUNICATION/COLLABORATION:   The patient???s plan of care was discussed with: Physical therapist and Registered nurse.     Ocie Cornfield, OT  Time Calculation: 35 mins

## 2019-12-17 NOTE — Progress Notes (Signed)
 Problem: Self Care Deficits Care Plan (Adult)  Goal: *Acute Goals and Plan of Care (Insert Text)  Description: Occupational Therapy Goals  Initiated: 12/12/2019   1.  Patient will perform grooming with supervision/set-up standing for one activity within 7 day(s).  2.  Patient will perform bathing with set-up/supervision from chair within 7 day(s).  3.  Patient will perform upper body dressing and lower body dressing with set-up/supervision within 7 day(s).  4.  Patient will perform toilet transfers with supervision within 7 day(s).  5.  Patient will perform all aspects of toileting with min A within 7 day(s).      FUNCTIONAL STATUS PRIOR TO ADMISSION: Patient was independent and active without use of DME.    HOME SUPPORT: The patient lived alone with no local support.      Outcome: Progressing Towards Goal     OCCUPATIONAL THERAPY TREATMENT  Patient: Lindsey Huynh (69 y.o. 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    ASSESSMENT  Patient continues with skilled OT services and is progressing towards goals.  Pt was able to progress from bed to [facility] with stand by assistance.  Pt provided extensive encouragement to discontinue use of purewick during day hours to maximize independence with all activities.  She transfers well at stand by assistance level needing only additional time.  Please strongly encourage use of BSC during the day with agreement she can use purewick at night.  Pt continent of bladder at home.  Recommend discharge to rehab setting as she is still below her functional baseline. Pt will benefit form discharge to rehab setting to maximize independence with ADL activities.      Current Level of Function Impacting Discharge (ADLs):  debility, preference for dependence    Other factors to consider for discharge: see nursing notes         PLAN :  Patient continues to benefit from skilled intervention to address the above impairments.  Continue treatment per established plan of care to address goals.    Recommend with staff: encouraged BSC use during the day    Recommend next OT session: toileting, dressing, bathing, grooming.  Enocurage OOB to chair    Recommendation for discharge: (in order for the patient to meet his/her long term goals)  Therapy up to 5 days/week in SNF setting    This discharge recommendation:  Has been made in collaboration with the attending provider and/or case management    IF patient discharges home will need the following DME: TBD       SUBJECTIVE:   Patient stated "You are taking that away."    OBJECTIVE DATA SUMMARY:   Cognitive/Behavioral Status:  Neurologic State: Alert  Orientation Level: Oriented X4  Cognition: Appropriate for age attention/concentration  Perception: Appears intact  Perseveration: No perseveration noted  Safety/Judgement: Good awareness of safety precautions    Functional Mobility and Transfers for ADLs:  Bed Mobility:  Supine to Sit: Supervision  Sit to Supine: Supervision    Transfers:  Sit to Stand: Stand-by assistance  Functional Transfers  Bathroom Mobility: (BSC only due to limited distance 5' only per MD)  Toilet Transfer : Stand-by assistance(using RW)       Balance:  Sitting: Intact  Sitting - Static: Good (unsupported)  Sitting - Dynamic:  Good (unsupported)  Standing: Impaired;With support  Standing - Static: Constant support;Fair  Standing - Dynamic : Constant support;Fair    ADL Intervention:   Pt received in bed.  She progressed to the [facility] with cues and education for use of walker for transfers.  She overall did very well with all activities.  She was able to have a bowel movement.  Completed hygiene while sitting with setup.  Pt then needed max A for clothing management.  Pt returned  to bed.  Provided encouragement to avoid use of purewick during day hours and to progress to the [facility].  She is resistant but then in agreement following encouragement.                              Toileting  Toileting Assistance: Minimum assistance  Bladder Hygiene: Supervision  Bowel Hygiene: Supervision  Clothing Management: Maximum assistance(due to brief and purewick)  Cues: Verbal cues provided(strongly enocuraged to discontinue purewick)    Cognitive Retraining  Safety/Judgement: Good awareness of safety precautions    Pain:  No pain    Activity Tolerance:   Good and requires rest breaks    After treatment patient left in no apparent distress:   Supine in bed, Heels elevated for pressure relief, Call bell within reach, and Side rails x 3    COMMUNICATION/COLLABORATION:   The patient's plan of care was discussed with: Physical therapist and Registered nurse.     Elvie CHRISTELLA Piano, OT  Time Calculation: 35 mins

## 2019-12-17 NOTE — Progress Notes (Signed)
ID Progress Note  12/17/2019       Left tma 3/18    Subjective:       Afebrile. No complaints.     Objective:     Vitals:   Visit Vitals  BP 126/65 (BP 1 Location: Left upper arm, BP Patient Position: At rest)   Pulse 86   Temp 98.4 ??F (36.9 ??C)   Resp 16   Ht 5' 5"  (1.651 m)   Wt 81.9 kg (180 lb 9.6 oz)   SpO2 98%   BMI 30.05 kg/m??        Tmax:  Temp (24hrs), Avg:98 ??F (36.7 ??C), Min:97.6 ??F (36.4 ??C), Max:98.5 ??F (36.9 ??C)      Exam:    Not in distress  Lung clear, no rales, wheezes or rhonchi   Heart: s1, s2, RRR, no murmurs rubs or clicks  Abdomen: soft nontender, no guarding or rebound  Left foot  bandaged.     Labs:   Lab Results   Component Value Date/Time    WBC 12.1 (H) 12/17/2019 01:07 AM    HGB 8.3 (L) 12/17/2019 01:07 AM    HCT 25.1 (L) 12/17/2019 01:07 AM    PLATELET 318 12/17/2019 01:07 AM    MCV 93.0 12/17/2019 01:07 AM     Lab Results   Component Value Date/Time    Sodium 137 12/17/2019 01:07 AM    Potassium 3.7 12/17/2019 01:07 AM    Chloride 107 12/17/2019 01:07 AM    CO2 20 (L) 12/17/2019 01:07 AM    Anion gap 10 12/17/2019 01:07 AM    Glucose 74 12/17/2019 01:07 AM    BUN 24 (H) 12/17/2019 01:07 AM    Creatinine 2.09 (H) 12/17/2019 01:07 AM    BUN/Creatinine ratio 11 (L) 12/17/2019 01:07 AM    GFR est AA 28 (L) 12/17/2019 01:07 AM    GFR est non-AA 23 (L) 12/17/2019 01:07 AM    Calcium 8.2 (L) 12/17/2019 01:07 AM    Bilirubin, total 0.2 11/23/2019 01:32 PM    Alk. 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  #2 hyperlipidemia  ??  #3 hypertension  ??  #4 diabetes  ??  #5 new onset fever - resolved    #6 AKI     Recommendations:       Had TMA 3/18. Pathology of proximal margin shows no osteo.     She has AKI. Blood eos  getting higher. Fevers resolved and cultures negative. MOnitor off abx.       Fortune Brannigan Sallyanne Havers, MD

## 2019-12-17 NOTE — Progress Notes (Signed)
Progress Notes by [name], DPM at 12/17/19 1255                Author: [name], DPM  Service: Podiatry  Author Type: Physician       Filed: 12/18/19 0757  Date of Service: 12/17/19 1255  Status: Signed          Editor: [name], DPM (Physician)                                                                     Francoise Schaumann, DPM - Despina Pole. 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,  pathology proximal margin negative      Post operative recommendations:    Pt can partial weight bear for short distances <5 steps in surgical shoe left foot   Dressings with adaptic, betadine, dry sterile dressing left foot, betadine and bandaid to ulcerations left calf; change 2-3 x /week   Surgical site appears to be healing with adequate perfusion   Pt is stable for discharge from a podiatry standpoint- planning d/c to SN[facility])   ID following - Cefepime course completed, proximal margin without osteomyelitis   Pt to f/u with me 1 week after discharge for close post op f/u      Do not hesitate to contact us via [employer] or phone (personal [phone]) with any questions.      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   Musculoskeletal: no joint / extremity pain, misalignment, stiffness, decreased ROM, crepitus.   Integument: No pruritis, rashes, lesions, left foot and leg wounds.   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  Neuromuscular Blockers, Steroidal  Other (comments)  GI Upset        History reviewed. No pertinent family history.    @HXMEDICAL @     Past Surgical History:         Procedure  Laterality  Date          ?  HX COLONOSCOPY         ?  HX ORTHOPAEDIC    06-30-10          back surgery (tumor removed)          ?  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  Smoking status:  Former Smoker              Years:  10.00         ?  Smokeless tobacco:  Never Used       Substance Use Topics         ?  Alcohol use:  Yes              Alcohol/week:  5.0 standard drinks              Types:  6 Cans of beer per week            Social History          Substance and Sexual Activity        Alcohol Use  Yes         ?  Alcohol/week:  5.0 standard drinks         ?  Types:  6 Cans of beer per week          Social History          Substance and Sexual Activity        Drug Use  No           Social History          Tobacco Use        Smoking Status  Former Smoker         ?  Years:  10.00        Smokeless Tobacco  Never Used          Current Facility-Administered Medications          Medication  Dose  Route  Frequency           ?  enoxaparin (LOVENOX) injection 30 mg   30 mg  SubCUTAneous  Q24H     ?  acetaminophen (TYLENOL) suppository 650 mg   650 mg  Rectal  Q6H PRN     ?  amLODIPine (NORVASC) tablet 5 mg   5 mg  Oral  DAILY     ?  [Held by provider] losartan (COZAAR) tablet 50 mg   50 mg  Oral  DAILY           ?  lactulose (CHRONULAC) 10 gram/15 mL  solution 30 mL   20 g  Oral  BID           ?  alteplase (CATHFLO) 1 mg in sterile water (preservative free) 1 mL injection   1 mg  InterCATHeter  PRN     ?  insulin glargine (LANTUS) injection 12 Units   12 Units  SubCUTAneous  QHS     ?  acetaminophen (TYLENOL) tablet 650 mg   650 mg  Oral  Q6H     ?  gabapentin (NEURONTIN) capsule 300 mg   300 mg  Oral  QHS     ?  gabapentin (NEURONTIN) capsule 100 mg   100 mg  Oral  DAILY     ?  sodium chloride (NS) flush 5-40 mL   5-40 mL  IntraVENous  Q8H     ?  sodium chloride (NS) flush 5-40 mL   5-40 mL  IntraVENous  PRN     ?  sodium phosphate (FLEET'S) enema 1 Enema   1 Enema  Rectal  PRN     ?  oxyCODONE IR (ROXICODONE) tablet 5 mg   5 mg  Oral  Q4H PRN     ?  docusate sodium (COLACE) capsule 100 mg   100 mg  Oral  BID     ?  senna (SENOKOT) tablet 8.6 mg   1 Tab  Oral  DAILY     ?  polyethylene glycol (MIRALAX) packet 17 g   17 g  Oral  DAILY PRN     ?  bisacodyL (DULCOLAX) suppository 10 mg   10 mg  Rectal  DAILY PRN     ?  [Held by provider] hydroCHLOROthiazide (HYDRODIURIL) tablet 25 mg   25 mg  Oral  DAILY     ?  sodium chloride (NS) flush 5-40 mL   5-40 mL  IntraVENous  Q8H     ?  sodium chloride (NS) flush 5-40 mL   5-40 mL  IntraVENous  PRN     ?  promethazine (PHENERGAN) tablet 12.5 mg   12.5 mg  Oral  Q6H PRN          Or           ?  ondansetron (ZOFRAN) injection 4 mg   4 mg  IntraVENous  Q6H PRN     ?  glucose chewable tablet 16 g   4 Tab  Oral  PRN           ?  dextrose (D50W) injection syrg 12.5-25 g   25-50 mL  IntraVENous  PRN           ?  glucagon (GLUCAGEN) injection 1 mg   1 mg  IntraMUSCular  PRN     ?  insulin lispro (HUMALOG) injection     SubCUTAneous  AC&HS     ?  aspirin delayed-release tablet 81 mg   81 mg  Oral  DAILY           ?  pantoprazole (PROTONIX) tablet 40 mg   40 mg  Oral  ACB            Objective:   Vitals:    Patient Vitals for the past 12 hrs:            BP  Temp  Pulse  Resp  SpO2            12/18/19 0425  124/72  98.2 ??F (36.8 ??C)   79  16  97 %           Vascular:   B/L LE   DP 0/4; PT 0/4   capillary fill time brisk, pitting edema is present, skin temperature is cool, varicosities are present.      Dermatological:   Nails are thickened, discolored, painful to palpation, 75mm thick, with subungual debris.  There is no maceration of the interspaces of the feet b/l.  No focal hyperkeratosis      Wound: 1    Location: surgical incisions left forefoot and posterior Achilles   Margins: approximated with sutures intact   Drainage: none   Odor: none   Wound base: skin edges granular   Lymphangitic streaking? No.   Undermining? No.   Sinus tracts? No.   Exposed bone? No.   Subcutaneous crepitation on palpation? No.   No necrosis noted         Neurological:   DTR are present, protective sensation per 5.07 Semmes Weinstein monofilament is absent, patient is AAOx3, mood is normal. Epicritic sensation is intact.      Orthopedic:   B/L LE are symmetric, ROM of ankle, STJ, 1st MTPJ is limited, MMT 5 out of 5 for B/L LE.  No pedal amputations noted      Constitutional: Pt is a well developed elderly female.            Imaging:   Exam Information            Status  Exam Begun    Exam Ended             Final [99]  12/05/2019  21:04  12/05/2019 ??9:08 PM  [id number] ??9:08 PM     Result Information         Status: Final result (Exam End: 12/05/2019 21:08)  Provider Status: Open     Study Result        EXAM: XR FOOT LT AP/LAT   ??   INDICATION: post op xray s/p transmetatarsal amputation.   ??   COMPARISON: None.   ??   FINDINGS: Two views of the left foot demonstrate transmetatarsal amputation. No   evidence of immediate complication.   ??   IMPRESSION   1. Transmetatarsal amputation.        Exam Information            Status  Exam Begun    Exam Ended             Final [99]  11/29/2019  20:49  11/29/2019 ??9:17 PM  [id number] ??9:17 PM     Result Information         Status: Final result (Exam End: 11/29/2019 21:17)  Provider Status: Open     Study Result         PRELIMINARY REPORT   Soft tissue defect on the dorsal aspect of the first distal phalanx with   underlying marrow edema as seen on the STIR images.   ??   ??   Preliminary report was provided by Dr. Art Buff, the on-call radiologist, at   21:41 hours on 11/29/2019   ??   Final report to follow.   ??   FINAL REPORT BELOW   ??   EXAM:  MRI LOW EXT LT WO CONT   ??   INDICATION: Left foot infection. Diabetes.   ??   COMPARISON: CT left foot on 11/23/2019.   ??   TECHNIQUE: Axial, coronal, and sagittal MRI of the left forefoot in the T1 and   inversion recovery pulse sequences with and without fat saturation .   ??   CONTRAST: None.   ??   FINDINGS:    ??   Bone marrow: Subtle cortical loss of the great toe distal phalanx. Bone marrow   edema is in the great toe distal phalanx.   ??   Remaining bone  marrow signal is heterogeneous but within normal limits. No acute   fracture or osteonecrosis.   ??   Joint fluid: Physiologic.   ??   Tendons: Intact.   ??   Muscles: Mild atrophy. Moderate edema.   ??   Neurovascular bundles: No neuroma. Mild first and third intermetatarsal   bursitis.   ??   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 No septic   arthritis. Normal Lisfranc joint.   ??   Soft tissue mass: Mild cellulitis of the toes. No abscess.   ??   IMPRESSION   ??   1. First distal phalanx osteomyelitis.   2. No abscess.           Exam Information            Status  Exam Begun    Exam Ended             Final [99]  11/23/2019  12:48  11/23/2019 12:52 PM  [id number] 12:52 PM     Result Information         Status: Final result (Exam End: 11/23/2019 12:52)  Provider Status: Open     Study Result        EXAM: XR FOOT LT MIN 3 V   ??   INDICATION: left foot pain.   ??   COMPARISON: None.   ??   FINDINGS: Three views of the left foot demonstrate no fracture or other acute   osseous or articular abnormality. The soft tissues are within normal limits.   Mild osteophytic changes noted, most prominent at the first metatarsophalangeal    joint.   ??   IMPRESSION   No acute abnormality.           11/28/2019   PROCEDURE PERFORMED:  Left mid superficial femoral artery to proximal peroneal artery bypass using nonreversed saphenous vein.      11/25/2019   PROCEDURE PERFORMED:   1.  Abdominal aortogram.   2.  Left leg runoff arteriogram with third order catheterization.   ??   SURGEON:  Belenda Cruise, MD   FINDINGS:   1.  Aortoiliac arteriogram:  The abdominal aorta and iliac arteries are patent without narrowing.   2.  Left leg runoff arteriogram:  The common femoral, profunda femoris, and superficial femoral arteries are patent.  There is no significant stenosis in the superficial femoral artery.  The SFA is occluded at its distal portion at the junction with  the popliteal artery.  The tibioperoneal trunk reconstitutes with two-vessel posterior tibial and peroneal outflow into the lower leg and foot.  The posterior tibial is a small diffusely narrow artery.  The peroneal artery is the best runoff vessel.   ??   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  The patient will need bypass surgery for limb salvage.         Noninvasive vascular studies    11/26/2019   Interpretation Summary      ??    Pressure measurements at the ankle are consistent with severe, bilateral, lower extremity arterial obstruction.         Lower Extremity Arterial Findings      ABI        ABI is severely abnormal on the right and the left.  PVR waveforms at the ankle are moderately to severely abnormal on the right and  left.  PPG waveforms are flat (  non-pulsatile) at the right and left great toe.          Results               Procedure  Component  Value  Units  Date/Time           CULTURE, BLOOD, PAIRED [id number]  Collected: 12/12/19 1620            Order Status: Completed  Specimen: Blood  Updated: 12/17/19 0537                Special Requests:  NO SPECIAL REQUESTS              Culture result:   NO GROWTH 5 DAYS                COVID-19 RAPID TEST [id number]  Collected: 12/12/19 1545            Order Status: Completed  Specimen: Nasopharyngeal  Updated: 12/12/19 1640                Specimen source  Nasopharyngeal              COVID-19 rapid test  Not detected                  Comment:  Rapid Abbott ID Now         Rapid NAAT:  The specimen is NEGATIVE for SARS-CoV-2, the novel coronavirus associated with COVID-19.         Negative results should be treated as presumptive and, if inconsistent with clinical signs and symptoms or necessary for patient management, should be tested with an alternative molecular assay.   Negative results do not preclude SARS-CoV-2 infection and should not be used as the sole basis for patient management decisions.         This test has been authorized by the FDA under an Emergency Use Authorization (EUA) for use by authorized laboratories.    Fact sheet for Healthcare Providers: LittleDVDs.dk   Fact sheet for Patients: SatelliteRebate.it         Methodology: Isothermal Nucleic Acid Amplification                        CULTURE, BLOOD, PAIRED [id number]  Collected: 12/07/19 0727            Order Status: Completed  Specimen: Blood  Updated: 12/12/19 0630                Special Requests:  NO SPECIAL REQUESTS              Culture result:  NO GROWTH 5 DAYS                CULTURE, BLOOD, PAIRED [id number]  Collected: 12/07/19 0700            Order Status: Canceled  Specimen: Blood             CULTURE, BLOOD, PAIRED [id number]  Collected: 12/07/19 0700            Order Status: Canceled  Specimen: Blood             RESPIRATORY VIRUS PANEL W/COVID-19, PCR [id number]  Collected: 12/06/19 1125            Order Status: Completed  Specimen: Nasopharyngeal  Updated: 12/06/19 1248                Adenovirus  Not detected  Coronavirus 229E  Not detected              Coronavirus HKU1  Not detected              Coronavirus CVNL63  Not  detected              Coronavirus OC43  Not detected              Metapneumovirus  Not detected              Rhinovirus and Enterovirus  Not detected              Influenza A  Not detected              Influenza A, subtype H1  Not detected              Influenza A, subtype H3  Not detected              INFLUENZA A H1N1 PCR  Not detected              Influenza B  Not detected              Parainfluenza 1  Not detected              Parainfluenza 2  Not detected              Parainfluenza 3  Not detected              Parainfluenza virus 4  Not detected              RSV by PCR  Not detected              B. parapertussis, PCR  Not detected              Bordetella pertussis - PCR  Not detected              Chlamydophila pneumoniae DNA, QL, PCR  Not detected              Mycoplasma pneumoniae DNA, QL, PCR  Not detected              SARS-CoV-2, PCR  Not detected                RESPIRATORY VIRUS PANEL W/COVID-19, PCR [id number]              Order Status: Canceled  Specimen: NASOPHARYNGEAL SWAB             CULTURE, ANAEROBIC [id number]  Collected: 12/05/19 1730            Order Status: Completed  Specimen: Foot, left  Updated: 12/08/19 0920                Special Requests:  NO SPECIAL REQUESTS              Culture result:  NO ANAEROBES ISOLATED                CULTURE, TISSUE Sid Falcon STAIN [id number]  (Abnormal)  (Susceptibility)  Collected: 12/05/19 1730            Order Status: Completed  Specimen: Foot, left  Updated: 12/08/19 0920              Special Requests:  DISTAL PHALANX BONE                GRAM STAIN  RARE GRAM NEGATIVE RODS  Culture result:                 LIGHT PSEUDOMONAS AERUGINOSA                      Susceptibility           Pseudomonas aeruginosa          MIC          Amikacin ($)  Susceptible          Cefepime ($$)  Susceptible          Ceftazidime ($)  Susceptible          Ciprofloxacin ($)  Susceptible          Gentamicin ($)  Susceptible          Levofloxacin ($)  Susceptible           Meropenem ($$)  Susceptible          Piperacillin/Tazobac ($)  Susceptible [1]           Tobramycin ($)  Susceptible                       [1]     **FDA INTERPRETATION REFLECTED, REFER TO [employer]SI FOR ALTERNATE INTERPRETATIONS.**                                          PATHOLOGY       FINAL PATHOLOGIC DIAGNOSIS    1. Bone, proximal margin left 1st ray:    Portion of bone with no evidence of active osteomyelitis.    2. Left forefoot, transmetatarsal amputation:    Ulceration with necrosis and acute inflammation extending into underlying bone consistent with active osteomyelitis.          Labs:     Recent Labs           12/18/19   0357     WBC  7.4     CREA  1.75*     BUN  20     HGB  6.9*     HCT  20.8*     NA  140     K  3.6     CL  113*     CO2  20*        GLU  79

## 2019-12-17 NOTE — Progress Notes (Signed)
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 Plans to discharge to [facility] tomorrow.  Riccardo Dubin

## 2019-12-17 NOTE — Progress Notes (Signed)
Progress Notes by Trisha Mangle, MD at 12/17/19 1223                Author: Trisha Mangle, MD  Service: Nephrology  Author Type: Physician       Filed: 12/17/19 1226  Date of Service: 12/17/19 1223  Status: Signed          Editor: Trisha Mangle, MD (Physician)                       [address], [address]   ???? [place] [address]   Phone: [phone]??????[phone]    ??   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    - urine eosinophils have a sensitivity of only 40-60%    - resolving    - off Abx    - stopped IVF to avoid fluid overload    ??   Left Foot Osteomyelitis   - S/P TMA 3/18    - On IV zosyn. Off IV vanc    - abx per ID     ??   Type II DM    ??   HTN    - hold ARB and thiazide diuretic    ??   GERD   ??   Care Plan discussed with: pt        Interval History:   Seen and examined    Edema stable    Cr better   ID stopped zosyn as well    peripheral eosinophilia improving       Review of Systems: A comprehensive review of systems was negative except for that written in the HPI.      Current Medications:      Current Facility-Administered Medications          Medication  Dose  Route  Frequency           ?  enoxaparin (LOVENOX) injection 30 mg   30 mg  SubCUTAneous  Q24H     ?  acetaminophen (TYLENOL) suppository 650 mg   650 mg  Rectal  Q6H PRN     ?  amLODIPine (NORVASC) tablet 5 mg   5 mg  Oral  DAILY     ?  [Held by provider] losartan (COZAAR) tablet 50 mg   50 mg  Oral  DAILY     ?  lactulose (CHRONULAC) 10 gram/15 mL solution 30 mL   20 g  Oral  BID     ?  alteplase (CATHFLO) 1 mg in sterile water (preservative free) 1 mL injection   1 mg  InterCATHeter  PRN     ?  insulin glargine (LANTUS) injection 12 Units   12 Units  SubCUTAneous  QHS     ?  acetaminophen  (TYLENOL) tablet 650 mg   650 mg  Oral  Q6H     ?  gabapentin (NEURONTIN) capsule 300 mg   300 mg  Oral  QHS     ?  gabapentin (NEURONTIN) capsule 100 mg   100 mg  Oral  DAILY     ?  sodium chloride (NS) flush 5-40 mL   5-40 mL  IntraVENous  Q8H     ?  sodium chloride (NS) flush 5-40 mL   5-40 mL  IntraVENous  PRN     ?  sodium phosphate (FLEET'S) enema 1 Enema   1 Enema  Rectal  PRN     ?  oxyCODONE IR (ROXICODONE) tablet 5 mg   5 mg  Oral  Q4H PRN     ?  docusate sodium (COLACE) capsule 100 mg   100 mg  Oral  BID     ?  senna (SENOKOT) tablet 8.6 mg   1 Tab  Oral  DAILY     ?  polyethylene glycol (MIRALAX) packet 17 g   17 g  Oral  DAILY PRN     ?  bisacodyL (DULCOLAX) suppository 10 mg   10 mg  Rectal  DAILY PRN           ?  [Held by provider] hydroCHLOROthiazide (HYDRODIURIL) tablet 25 mg   25 mg  Oral  DAILY           ?  sodium chloride (NS) flush 5-40 mL   5-40 mL  IntraVENous  Q8H     ?  sodium chloride (NS) flush 5-40 mL   5-40 mL  IntraVENous  PRN     ?  promethazine (PHENERGAN) tablet 12.5 mg   12.5 mg  Oral  Q6H PRN          Or           ?  ondansetron (ZOFRAN) injection 4 mg   4 mg  IntraVENous  Q6H PRN     ?  glucose chewable tablet 16 g   4 Tab  Oral  PRN     ?  dextrose (D50W) injection syrg 12.5-25 g   25-50 mL  IntraVENous  PRN     ?  glucagon (GLUCAGEN) injection 1 mg   1 mg  IntraMUSCular  PRN     ?  insulin lispro (HUMALOG) injection     SubCUTAneous  AC&HS     ?  aspirin delayed-release tablet 81 mg   81 mg  Oral  DAILY           ?  pantoprazole (PROTONIX) tablet 40 mg   40 mg  Oral  ACB           Allergies        Allergen  Reactions         ?  Neuromuscular Blockers, Steroidal  Other (comments)             GI Upset           Objective:   Vitals:       Vitals:             12/16/19 1447  12/16/19 2000  12/17/19 0436  12/17/19 0841           BP:  115/70  120/72  115/62  126/65     Pulse:  81  80  73  86     Resp:  16  16    16      Temp:  97.6 ??F (36.4 ??C)  97.6 ??F (36.4 ??C)  98.5 ??F (36.9 ??C)   98.4 ??F (36.9 ??C)     SpO2:  97%  97%  96%  98%     Weight:  Height:                Intake and Output:   No intake/output data recorded.   03/28 1901 - 03/30 0700   In: 2050 [I.V.:2050]   Out: 700 [Urine:700]      Physical Examination:   General:  No apparent Distress   HEENT: PERRL, + Pallor , No Icterus   Neck: Supple,no mass palpable   Lungs : CTA   CVS: RRR, S1 S2 normal, No murmur    Abdomen: Soft, NT, BS +   Extremities: LLE - foot in dressing and LE edema +   Skin: No rash or lesions.   MS: No joint swelling, erythema, warmth   Neurologic: non focal, AAO x 3   Psych: normal affect/   ??   []      High complexity decision making was performed   []     Patient is at high-risk of  decompensation with multiple organ involvement      Lab Data Personally Reviewed: I have reviewed all the pertinent labs, microbiology data and radiology studies during assessment.        Recent Labs             12/17/19   0107  12/16/19   0522  12/15/19   0637     NA  137  134*  134*     K  3.7  3.5  3.8     CL  107  105  104     CO2  20*  18*  22     GLU  74  110*  122*     BUN  24*  25*  18     CREA  2.09*  2.31*  1.76*          CA  8.2*  8.2*  8.2*          Recent Labs             12/17/19   0107  12/16/19   0522  12/15/19   0637     WBC  12.1*  12.6*  10.9     HGB  8.3*  8.5*  7.9*     HCT  25.1*  25.9*  23.7*          PLT  318  309  307        No results found for: SDES     Lab Results         Component  Value  Date/Time            Culture result:  NO GROWTH 5 DAYS  12/12/2019 04:20 PM       Culture result:  NO GROWTH 5 DAYS  12/07/2019 07:27 AM       Culture result:  NO ANAEROBES ISOLATED  12/05/2019 05:30 PM       Culture result:  LIGHT PSEUDOMONAS AERUGINOSA (A)  12/05/2019 05:30 PM            Culture result:  NO GROWTH 5 DAYS  11/23/2019 01:49 PM          Recent Results (from the past 24 hour(s))     VANCOMYCIN, RANDOM          Collection Time: 12/16/19  2:41 PM         Result  Value  Ref Range             Vancomycin, random  18.6  UG/ML  GLUCOSE, POC          Collection Time: 12/16/19  4:28 PM         Result  Value  Ref Range            Glucose (POC)  155 (H)  65 - 100 mg/dL       Performed by  Sandi Raveling         GLUCOSE, POC          Collection Time: 12/16/19  9:37 PM         Result  Value  Ref Range            Glucose (POC)  96  65 - 100 mg/dL       Performed by  [place], BASIC          Collection Time: 12/17/19  1:07 AM         Result  Value  Ref Range            Sodium  137  136 - 145 mmol/L       Potassium  3.7  3.5 - 5.1 mmol/L       Chloride  107  97 - 108 mmol/L       CO2  20 (L)  21 - 32 mmol/L       Anion gap  10  5 - 15 mmol/L       Glucose  74  65 - 100 mg/dL       BUN  24 (H)  6 - 20 MG/DL       Creatinine  2.09 (H)  0.55 - 1.02 MG/DL       BUN/Creatinine ratio  11 (L)  12 - 20         GFR est AA  28 (L)  >60 ml/min/1.77m2       GFR est non-AA  23 (L)  >60 ml/min/1.45m2       Calcium  8.2 (L)  8.5 - 10.1 MG/DL       CBC WITH AUTOMATED DIFF          Collection Time: 12/17/19  1:07 AM         Result  Value  Ref Range            WBC  12.1 (H)  3.6 - 11.0 K/uL       RBC  2.70 (L)  3.80 - 5.20 M/uL       HGB  8.3 (L)  11.5 - 16.0 g/dL       HCT  25.1 (L)  35.0 - 47.0 %       MCV  93.0  80.0 - 99.0 FL       MCH  30.7  26.0 - 34.0 PG       MCHC  33.1  30.0 - 36.5 g/dL       RDW  14.4  11.5 - 14.5 %       PLATELET  318  150 - 400 K/uL       MPV  10.5  8.9 - 12.9 FL       NRBC  0.0  0 PER 100 WBC       ABSOLUTE NRBC  0.00  0.00 - 0.01 K/uL       NEUTROPHILS  63  32 - 75 %       BAND NEUTROPHILS  1  0 - 6 %       LYMPHOCYTES  14  12 - 49 %       MONOCYTES  3 (L)  5 - 13 %       EOSINOPHILS  19 (H)  0 - 7 %       BASOPHILS  0  0 - 1 %       IMMATURE GRANULOCYTES  0  %       ABS. NEUTROPHILS  7.7  1.8 - 8.0 K/UL       ABS. LYMPHOCYTES  1.7  0.8 - 3.5 K/UL       ABS. MONOCYTES  0.4  0.0 - 1.0 K/UL       ABS. EOSINOPHILS  2.3 (H)  0.0 - 0.4 K/UL       ABS. BASOPHILS  0.0   0.0 - 0.1 K/UL       ABS. IMM. GRANS.  0.0  K/UL       DF  MANUAL          RBC COMMENTS  ANISOCYTOSIS   1+             RBC COMMENTS  BURR CELLS   PRESENT             CREATININE, UR, RANDOM          Collection Time: 12/17/19  1:13 AM         Result  Value  Ref Range            Creatinine, urine  114.00  mg/dL       EOSINOPHILS, URINE          Collection Time: 12/17/19  1:13 AM         Result  Value  Ref Range            Eosinophils,urine  Negative          SODIUM, UR, RANDOM          Collection Time: 12/17/19  1:13 AM         Result  Value  Ref Range            Sodium,urine random  23  MMOL/L       GLUCOSE, POC          Collection Time: 12/17/19  5:49 AM         Result  Value  Ref Range            Glucose (POC)  74  65 - 100 mg/dL       Performed by  [place], POC          Collection Time: 12/17/19 11:52 AM         Result  Value  Ref Range            Glucose (POC)  93  65 - 100 mg/dL            Performed by  Sandi Raveling                   Total time spent with patient:  xxx    min.                                Care Plan discussed with:      Patient  Family         RN            Consulting Physician /Specialist            I have reviewed the flowsheets.   Chart and Pertinent Notes have been  reviewed.    No change in PMH ,family and social history from Consult note.         Trisha Mangle, MD

## 2019-12-17 NOTE — Progress Notes (Signed)
TOC: Auth restarted yesterday with [facility] per [place] (liaison). Discharge plan for tomorrow if MD clears and Auth approved. BLS to transport and [facility] on will call. CM following for discharge needs.    RUR: 26%    Nathanial Millman RN/CRM

## 2019-12-17 NOTE — Progress Notes (Signed)
Progress  Notes by Cheryll Cockayne, MD at 12/17/19 1402                Author: Cheryll Cockayne, MD  Service: Hospitalist  Author Type: Physician       Filed: 12/17/19 1405  Date of Service: 12/17/19 1402  Status: Signed          Editor: Cheryll Cockayne, MD (Physician)                    [facility]                                                                                              Hospitalist Progress Note   Cheryll Cockayne, MD   Answering service: [phone] OR [phone] from in house phone            Date of Service:  12/17/2019   NAME:  Lindsey Huynh   DOB:  04/01/1951   MRN:  [id number]           Admission Summary:           "Lindsey Huynh??is a 69 y.o.??female??with past medical history of diabetes, insulin-dependent, GERD and hypertension comes for return of left foot pain and swelling.      Interval history / Subjective:          F/U for left foot cellulitis, left great toe gangrene   Patient is seen and examined at bedside this AM. Feels ok. No overnight events or new complaints. No fevers.  Explained cr worsened due to ABx, now improving    Discussed with nursing and nephrology Dr. Ailene Rud           Assessment & Plan:          Left great toe Osteomyelitis s/p TMA 3/18   PAD: Status post femoropopliteal bypass on November 28, 2019   LLE pain:??dry gangrene??of??left 2nd toe cellulitic changes/Left popliteal occlusion   ??- MRI LLE:??1. First distal phalanx osteomyelitis.   - completed cefepime. Appreciate ID input.    - surgical margin clear   - Vascular surgery following- Patient to follow up in 2 weeks post- op   - appreciate Podiatry input    - c/w aspirin    ??   Fever 3/25 and 3/26   - normal wbc, lactic, procalcitonin   - UA clean. CXR: normal   - rapid covid and pcr negative    - blood cx : NGTD   - surgical site - no drainage   - appreciate ID input   - leucocytosis on 3/27    - discontinued vanco 3/28 and zosyn 3/29   - afebrile >48hrs   - monitor off abx        AKI - cr improving    - ATN due to abx   - renal US normal    - s/p IVF   - appreciate nephrology input   - will monitor renal function       Anemia    -  No signs of bleeding noted. hgb low stable    -  Monitor labs and transfuse if hgb <7.0??   ??   DMII:??hold metformin while IP   -Cont basal insulin, SSI   -A1c 8.1   - DM education    ??   HTN   -Continue amlodipine. holding Cozaar and HCTZ due to soft BP    ??   GERD   -Cont PPI      Code status: Full    DVT prophylaxis: Lovenox       Care Plan discussed with: Patient/Family, Nurse and Case Manager   Anticipated Disposition: TBD. Possible dc tomorrow    Anticipated Discharge: [facility]   Date Reviewed:  05/31/2018                         Codes  Class  Noted  POA              [facility])  ICD-10-CM: [id number]   ICD-9-CM: 785.4    11/23/2019  Unknown                               Review of Systems:     A comprehensive review of systems was negative except for that written in the HPI.       Xr Foot Lt Ap/lat      Result Date: 12/05/2019   1. Transmetatarsal amputation.      Xr Foot Lt Min 3 V      Result Date: 11/23/2019   No acute abnormality.      Mri Low Ext Lt Wo Cont      Result Date: 12/01/2019   1. First distal phalanx osteomyelitis. 2. No abscess.       Ct Low Ext Lt W Cont      Result Date: 11/24/2019   No CT imaging evidence for soft tissue gas, abscess or osteomyelitis.      [place] Retroperitoneum Comp      Result Date: 12/16/2019   Kidneys are within normal limits. There is no hydronephrosis.       Xr Chest Port      Result Date: 12/12/2019   No evidence of acute cardiopulmonary process.       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 Fluoro Dose:  144.09 mGy. vk        Vital Signs:      Last 24hrs VS reviewed since prior progress note. Most recent are:   Visit Vitals      BP  126/65 (BP 1 Location: Left upper arm, BP Patient Position: At rest)     Pulse  86     Temp  98.4 ??F (36.9 ??C)     Resp  16     Ht   5\' 5"  (1.651 m)     Wt  81.9 kg (180 lb 9.6 oz)     SpO2  98%        BMI  30.05 kg/m??              Intake/Output Summary (Last 24 hours) at 12/17/2019 1402   Last data filed at 12/17/2019 0441     Gross per 24 hour        Intake  2050 ml        Output  700 ml  Net  1350 ml              Physical Examination:                     Constitutional:   No acute distress, cooperative     ENT:   Oral mucosa moist, oropharynx benign.      Resp:   CTA bilaterally.     CV:   Regular rhythm, normal rate,          GI:   Soft, non distended, non tender.bs+         Musculoskeletal:   LLE dressing         Neurologic:   Moves all extremities.  AAOx3, CN II-XII reviewed, follows commands       Psych:  Good insight, Not anxious nor agitated.         ??                       Data Review:      Review and/or order of clinical lab test   Review and/or order of tests in the medicine section of CPT           Labs:          Recent Labs            12/17/19   0107  12/16/19   0522     WBC  12.1*  12.6*     HGB  8.3*  8.5*     HCT  25.1*  25.9*         PLT  318  309          Recent Labs             12/17/19   0107  12/16/19   0522  12/15/19   0637     NA  137  134*  134*     K  3.7  3.5  3.8     CL  107  105  104     CO2  20*  18*  22     BUN  24*  25*  18     CREA  2.09*  2.31*  1.76*     GLU  74  110*  122*          CA  8.2*  8.2*  8.2*        No results for input(s): ALT, AP, TBIL, TBILI, TP, ALB, GLOB, GGT, AML, LPSE in the last 72 hours.      No lab exists for component: SGOT, GPT, AMYP, HLPSE   No results for input(s): INR, PTP, APTT, INREXT, INREXT in the last 72 hours.    No results for input(s): FE, TIBC, PSAT, FERR in the last 72 hours.    No results found for: FOL, RBCF    No results for input(s): PH, PCO2, PO2 in the last 72 hours.   No results for input(s): CPK, CKNDX, TROIQ in the last 72 hours.      No lab exists for component: CPKMB     Lab Results         Component  Value  Date/Time            Cholesterol, total  219 (H)   10/30/2018 10:27 AM       HDL Cholesterol  63  10/30/2018 10:27 AM       LDL, calculated  130 (H)  10/30/2018 10:27 AM       Triglyceride  131  10/30/2018 10:27 AM            CHOL/HDL Ratio  3.4  12/26/2007 09:12 AM          Lab Results         Component  Value  Date/Time            Glucose (POC)  93  12/17/2019 11:52 AM       Glucose (POC)  74  12/17/2019 05:49 AM       Glucose (POC)  96  12/16/2019 09:37 PM       Glucose (POC)  155 (H)  12/16/2019 04:28 PM            Glucose (POC)  118 (H)  12/16/2019 11:55 AM          Lab Results         Component  Value  Date/Time            Color  YELLOW/STRAW  12/13/2019 06:24 AM       Appearance  CLEAR  12/13/2019 06:24 AM       Specific gravity  1.022  12/13/2019 06:24 AM       pH (UA)  5.5  12/13/2019 06:24 AM       Protein  100 (A)  12/13/2019 06:24 AM       Glucose  Negative  12/13/2019 06:24 AM       Ketone  Negative  12/13/2019 06:24 AM       Bilirubin  Negative  12/13/2019 06:24 AM       Urobilinogen  0.2  12/13/2019 06:24 AM       Nitrites  Negative  12/13/2019 06:24 AM       Leukocyte Esterase  Negative  12/13/2019 06:24 AM       Epithelial cells  FEW  12/13/2019 06:24 AM       Bacteria  Negative  12/13/2019 06:24 AM       WBC  0-4  12/13/2019 06:24 AM            RBC  0-5  12/13/2019 06:24 AM                Medications Reviewed:          Current Facility-Administered Medications          Medication  Dose  Route  Frequency           ?  enoxaparin (LOVENOX) injection 30 mg   30 mg  SubCUTAneous  Q24H     ?  acetaminophen (TYLENOL) suppository 650 mg   650 mg  Rectal  Q6H PRN     ?  amLODIPine (NORVASC) tablet 5 mg   5 mg  Oral  DAILY     ?  [Held by provider] losartan (COZAAR) tablet 50 mg   50 mg  Oral  DAILY     ?  lactulose (CHRONULAC) 10 gram/15 mL solution 30 mL   20 g  Oral  BID     ?  alteplase (CATHFLO) 1 mg in sterile water (preservative free) 1 mL injection   1 mg  InterCATHeter  PRN     ?  insulin glargine (LANTUS) injection 12 Units   12 Units  SubCUTAneous   QHS     ?  acetaminophen (TYLENOL) tablet 650 mg   650 mg  Oral  Q6H     ?  gabapentin (NEURONTIN) capsule 300 mg   300 mg  Oral  QHS     ?  gabapentin (NEURONTIN) capsule 100 mg   100 mg  Oral  DAILY     ?  sodium chloride (NS) flush 5-40 mL   5-40 mL  IntraVENous  Q8H     ?  sodium chloride (NS) flush 5-40 mL   5-40 mL  IntraVENous  PRN     ?  sodium phosphate (FLEET'S) enema 1 Enema   1 Enema  Rectal  PRN     ?  oxyCODONE IR (ROXICODONE) tablet 5 mg   5 mg  Oral  Q4H PRN     ?  docusate sodium (COLACE) capsule 100 mg   100 mg  Oral  BID     ?  senna (SENOKOT) tablet 8.6 mg   1 Tab  Oral  DAILY     ?  polyethylene glycol (MIRALAX) packet 17 g   17 g  Oral  DAILY PRN     ?  bisacodyL (DULCOLAX) suppository 10 mg   10 mg  Rectal  DAILY PRN     ?  [Held by provider] hydroCHLOROthiazide (HYDRODIURIL) tablet 25 mg   25 mg  Oral  DAILY     ?  sodium chloride (NS) flush 5-40 mL   5-40 mL  IntraVENous  Q8H     ?  sodium chloride (NS) flush 5-40 mL   5-40 mL  IntraVENous  PRN     ?  promethazine (PHENERGAN) tablet 12.5 mg   12.5 mg  Oral  Q6H PRN          Or           ?  ondansetron (ZOFRAN) injection 4 mg   4 mg  IntraVENous  Q6H PRN     ?  glucose chewable tablet 16 g   4 Tab  Oral  PRN     ?  dextrose (D50W) injection syrg 12.5-25 g   25-50 mL  IntraVENous  PRN     ?  glucagon (GLUCAGEN) injection 1 mg   1 mg  IntraMUSCular  PRN     ?  insulin lispro (HUMALOG) injection     SubCUTAneous  AC&HS     ?  aspirin delayed-release tablet 81 mg   81 mg  Oral  DAILY           ?  pantoprazole (PROTONIX) tablet 40 mg   40 mg  Oral  ACB        ______________________________________________________________________   EXPECTED LENGTH OF STAY: 7d 0h   ACTUAL LENGTH OF STAY:          24                    Cheryll Cockayne, MD

## 2019-12-17 NOTE — Progress Notes (Signed)
Bedside shift change report given to Rodney Booze, Charity fundraiser (oncoming nurse) by Lequita Halt, RN (offgoing nurse). Report included the following information SBAR.

## 2019-12-17 NOTE — Progress Notes (Signed)
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 Plans to discharge to [facility] tomorrow.  Riccardo Dubin

## 2019-12-17 NOTE — Progress Notes (Signed)
[address], [address]  ???? [place] [address]  Phone: [phone]??????[phone]   ??  Nephrology Progress Note  Lindsey Huynh     [birth year]     [id number]  Date of Admission : 11/23/2019  12/17/19    CC:  Follow up for ARF     Assessment and Plan   AKI :  - 2/2 AIN / ATIN from vanc and zosyn.   - urine eosinophils have a sensitivity of only 40-60%   - resolving   - off Abx   - stopped IVF to avoid fluid overload   ??  Left Foot Osteomyelitis  - S/P TMA 3/18   - On IV zosyn. Off IV vanc   - abx per ID    ??  Type II DM   ??  HTN   - hold ARB and thiazide diuretic   ??  GERD  ??  Care Plan discussed with: pt     Interval History:  Seen and examined   Edema stable   Cr better  ID stopped zosyn as well   peripheral eosinophilia improving     Review of Systems: A comprehensive review of systems was negative except for that written in the HPI.    Current Medications:   Current Facility-Administered Medications   Medication Dose Route Frequency   ??? enoxaparin (LOVENOX) injection 30 mg  30 mg SubCUTAneous Q24H   ??? acetaminophen (TYLENOL) suppository 650 mg  650 mg Rectal Q6H PRN   ??? amLODIPine (NORVASC) tablet 5 mg  5 mg Oral DAILY   ??? [Held by provider] losartan (COZAAR) tablet 50 mg  50 mg Oral DAILY   ??? lactulose (CHRONULAC) 10 gram/15 mL solution 30 mL  20 g Oral BID   ??? alteplase (CATHFLO) 1 mg in sterile water (preservative free) 1 mL injection  1 mg InterCATHeter PRN   ??? insulin glargine (LANTUS) injection 12 Units  12 Units SubCUTAneous QHS   ??? acetaminophen (TYLENOL) tablet 650 mg  650 mg Oral Q6H   ??? gabapentin (NEURONTIN) capsule 300 mg  300 mg Oral QHS   ??? gabapentin (NEURONTIN) capsule 100 mg  100 mg Oral DAILY   ??? sodium chloride (NS) flush 5-40 mL  5-40 mL IntraVENous Q8H   ??? sodium chloride (NS) flush 5-40 mL  5-40 mL IntraVENous PRN   ??? sodium phosphate (FLEET'S) enema 1 Enema  1 Enema Rectal PRN   ??? oxyCODONE IR (ROXICODONE) tablet 5 mg  5 mg Oral Q4H PRN   ??? docusate  sodium (COLACE) capsule 100 mg  100 mg Oral BID   ??? senna (SENOKOT) tablet 8.6 mg  1 Tab Oral DAILY   ??? polyethylene glycol (MIRALAX) packet 17 g  17 g Oral DAILY PRN   ??? bisacodyL (DULCOLAX) suppository 10 mg  10 mg Rectal DAILY PRN   ??? [Held by provider] hydroCHLOROthiazide (HYDRODIURIL) tablet 25 mg  25 mg Oral DAILY   ??? sodium chloride (NS) flush 5-40 mL  5-40 mL IntraVENous Q8H   ??? sodium chloride (NS) flush 5-40 mL  5-40 mL IntraVENous PRN   ??? promethazine (PHENERGAN) tablet 12.5 mg  12.5 mg Oral Q6H PRN    Or   ??? ondansetron (ZOFRAN) injection 4 mg  4 mg IntraVENous Q6H PRN   ??? glucose chewable tablet 16 g  4 Tab Oral PRN   ??? dextrose (D50W) injection syrg 12.5-25 g  25-50 mL IntraVENous PRN   ???  glucagon (GLUCAGEN) injection 1 mg  1 mg IntraMUSCular PRN   ??? insulin lispro (HUMALOG) injection   SubCUTAneous AC&HS   ??? aspirin delayed-release tablet 81 mg  81 mg Oral DAILY   ??? pantoprazole (PROTONIX) tablet 40 mg  40 mg Oral ACB      Allergies   Allergen Reactions   ??? Neuromuscular Blockers, Steroidal Other (comments)     GI Upset       Objective:  Vitals:    Vitals:    12/16/19 1447 12/16/19 2000 12/17/19 0436 12/17/19 0841   BP: 115/70 120/72 115/62 126/65   Pulse: 81 80 73 86   Resp: 16 16  16    Temp: 97.6 ??F (36.4 ??C) 97.6 ??F (36.4 ??C) 98.5 ??F (36.9 ??C) 98.4 ??F (36.9 ??C)   SpO2: 97% 97% 96% 98%   Weight:       Height:         Intake and Output:  No intake/output data recorded.  03/28 1901 - 03/30 0700  In: 2050 [I.V.:2050]  Out: 700 [Urine:700]    Physical Examination:  General:  No apparent Distress  HEENT: PERRL, + Pallor , No Icterus  Neck: Supple,no mass palpable  Lungs : CTA  CVS: RRR, S1 S2 normal, No murmur   Abdomen: Soft, NT, BS +  Extremities: LLE - foot in dressing and LE edema +  Skin: No rash or lesions.  MS: No joint swelling, erythema, warmth  Neurologic: non focal, AAO x 3  Psych: normal affect/  ??  []     High complexity decision making was performed  []     Patient is at high-risk of  decompensation with multiple organ involvement    Lab Data Personally Reviewed: I have reviewed all the pertinent labs, microbiology data and radiology studies during assessment.    Recent Labs     12/17/19  0107 12/16/19  0522 12/15/19  0637   NA 137 134* 134*   K 3.7 3.5 3.8   CL 107 105 104   CO2 20* 18* 22   GLU 74 110* 122*   BUN 24* 25* 18   CREA 2.09* 2.31* 1.76*   CA 8.2* 8.2* 8.2*     Recent Labs     12/17/19  0107 12/16/19  0522 12/15/19  0637   WBC 12.1* 12.6* 10.9   HGB 8.3* 8.5* 7.9*   HCT 25.1* 25.9* 23.7*   PLT 318 309 307     No results found for: SDES  Lab Results   Component Value Date/Time    Culture result: NO GROWTH 5 DAYS 12/12/2019 04:20 PM    Culture result: NO GROWTH 5 DAYS 12/07/2019 07:27 AM    Culture result: NO ANAEROBES ISOLATED 12/05/2019 05:30 PM    Culture result: LIGHT PSEUDOMONAS AERUGINOSA (A) 12/05/2019 05:30 PM    Culture result: NO GROWTH 5 DAYS 11/23/2019 01:49 PM     Recent Results (from the past 24 hour(s))   VANCOMYCIN, RANDOM    Collection Time: 12/16/19  2:41 PM   Result Value Ref Range    Vancomycin, random 18.6 UG/ML   GLUCOSE, POC    Collection Time: 12/16/19  4:28 PM   Result Value Ref Range    Glucose (POC) 155 (H) 65 - 100 mg/dL    Performed by Sandi Raveling    GLUCOSE, POC    Collection Time: 12/16/19  9:37 PM   Result Value Ref Range    Glucose (POC) 96 65 - 100 mg/dL  Performed by Maryla Morrow [name]    METABOLIC PANEL, BASIC    Collection Time: 12/17/19  1:07 AM   Result Value Ref Range    Sodium 137 136 - 145 mmol/L    Potassium 3.7 3.5 - 5.1 mmol/L    Chloride 107 97 - 108 mmol/L    CO2 20 (L) 21 - 32 mmol/L    Anion gap 10 5 - 15 mmol/L    Glucose 74 65 - 100 mg/dL    BUN 24 (H) 6 - 20 MG/DL    Creatinine 2.09 (H) 0.55 - 1.02 MG/DL    BUN/Creatinine ratio 11 (L) 12 - 20      GFR est AA 28 (L) >60 ml/min/1.25m2    GFR est non-AA 23 (L) >60 ml/min/1.32m2    Calcium 8.2 (L) 8.5 - 10.1 MG/DL   CBC WITH AUTOMATED DIFF    Collection Time: 12/17/19  1:07 AM    Result Value Ref Range    WBC 12.1 (H) 3.6 - 11.0 K/uL    RBC 2.70 (L) 3.80 - 5.20 M/uL    HGB 8.3 (L) 11.5 - 16.0 g/dL    HCT 25.1 (L) 35.0 - 47.0 %    MCV 93.0 80.0 - 99.0 FL    MCH 30.7 26.0 - 34.0 PG    MCHC 33.1 30.0 - 36.5 g/dL    RDW 14.4 11.5 - 14.5 %    PLATELET 318 150 - 400 K/uL    MPV 10.5 8.9 - 12.9 FL    NRBC 0.0 0 PER 100 WBC    ABSOLUTE NRBC 0.00 0.00 - 0.01 K/uL    NEUTROPHILS 63 32 - 75 %    BAND NEUTROPHILS 1 0 - 6 %    LYMPHOCYTES 14 12 - 49 %    MONOCYTES 3 (L) 5 - 13 %    EOSINOPHILS 19 (H) 0 - 7 %    BASOPHILS 0 0 - 1 %    IMMATURE GRANULOCYTES 0 %    ABS. NEUTROPHILS 7.7 1.8 - 8.0 K/UL    ABS. LYMPHOCYTES 1.7 0.8 - 3.5 K/UL    ABS. MONOCYTES 0.4 0.0 - 1.0 K/UL    ABS. EOSINOPHILS 2.3 (H) 0.0 - 0.4 K/UL    ABS. BASOPHILS 0.0 0.0 - 0.1 K/UL    ABS. IMM. GRANS. 0.0 K/UL    DF MANUAL      RBC COMMENTS ANISOCYTOSIS  1+        RBC COMMENTS BURR CELLS  PRESENT       CREATININE, UR, RANDOM    Collection Time: 12/17/19  1:13 AM   Result Value Ref Range    Creatinine, urine 114.00 mg/dL   EOSINOPHILS, URINE    Collection Time: 12/17/19  1:13 AM   Result Value Ref Range    Eosinophils,urine Negative     SODIUM, UR, RANDOM    Collection Time: 12/17/19  1:13 AM   Result Value Ref Range    Sodium,urine random 23 MMOL/L   GLUCOSE, POC    Collection Time: 12/17/19  5:49 AM   Result Value Ref Range    Glucose (POC) 74 65 - 100 mg/dL    Performed by [place], POC    Collection Time: 12/17/19 11:52 AM   Result Value Ref Range    Glucose (POC) 93 65 - 100 mg/dL    Performed by Sandi Raveling            Total  time spent with patient:  xxx   min.                               Care Plan discussed with:  Patient     Family      RN      Consulting Physician [place]        I have reviewed the flowsheets.  Chart and Pertinent Notes have been reviewed.   No change in PMH ,family and social history from Consult note.      Trisha Mangle, MD

## 2019-12-18 LAB — CBC WITH AUTOMATED DIFF
ABS. BASOPHILS: 0.1 10*3/uL (ref 0.0–0.1)
ABS. EOSINOPHILS: 1 10*3/uL — ABNORMAL HIGH (ref 0.0–0.4)
ABS. IMM. GRANS.: 0.1 10*3/uL — ABNORMAL HIGH (ref 0.00–0.04)
ABS. LYMPHOCYTES: 1.1 10*3/uL (ref 0.8–3.5)
ABS. MONOCYTES: 0.4 10*3/uL (ref 0.0–1.0)
ABS. NEUTROPHILS: 4.7 10*3/uL (ref 1.8–8.0)
ABSOLUTE NRBC: 0 10*3/uL (ref 0.00–0.01)
BASOPHILS: 1 % (ref 0–1)
EOSINOPHILS: 13 % — ABNORMAL HIGH (ref 0–7)
HCT: 20.8 % — ABNORMAL LOW (ref 35.0–47.0)
HGB: 6.9 g/dL — ABNORMAL LOW (ref 11.5–16.0)
IMMATURE GRANULOCYTES: 1 % — ABNORMAL HIGH (ref 0.0–0.5)
LYMPHOCYTES: 15 % (ref 12–49)
MCH: 30.8 PG (ref 26.0–34.0)
MCHC: 33.2 g/dL (ref 30.0–36.5)
MCV: 92.9 FL (ref 80.0–99.0)
MONOCYTES: 5 % (ref 5–13)
MPV: 10.6 FL (ref 8.9–12.9)
NEUTROPHILS: 65 % (ref 32–75)
NRBC: 0 PER 100 WBC
PLATELET: 256 10*3/uL (ref 150–400)
RBC: 2.24 M/uL — ABNORMAL LOW (ref 3.80–5.20)
RDW: 14.5 % (ref 11.5–14.5)
WBC: 7.4 10*3/uL (ref 3.6–11.0)

## 2019-12-18 LAB — VITAMIN B12
Vitamin B-12: 1734 pg/mL — ABNORMAL HIGH (ref 193–986)
Vitamin B12: 1734 pg/mL — ABNORMAL HIGH (ref 193–986)

## 2019-12-18 LAB — IRON PROFILE
Iron % saturation: 32 % (ref 20–50)
Iron: 49 ug/dL (ref 35–150)
TIBC: 154 ug/dL — ABNORMAL LOW (ref 250–450)

## 2019-12-18 LAB — GLUCOSE, POC
Glucose (POC): 124 mg/dL — ABNORMAL HIGH (ref 65–100)
Glucose (POC): 85 mg/dL (ref 65–100)
Glucose (POC): 90 mg/dL (ref 65–100)
Glucose (POC): 87 mg/dL (ref 65–100)

## 2019-12-18 LAB — METABOLIC PANEL, BASIC
Anion gap: 7 mmol/L (ref 5–15)
BUN/Creatinine ratio: 11 — ABNORMAL LOW (ref 12–20)
BUN: 20 MG/DL (ref 6–20)
CO2: 20 mmol/L — ABNORMAL LOW (ref 21–32)
Calcium: 7.7 MG/DL — ABNORMAL LOW (ref 8.5–10.1)
Chloride: 113 mmol/L — ABNORMAL HIGH (ref 97–108)
Creatinine: 1.75 MG/DL — ABNORMAL HIGH (ref 0.55–1.02)
GFR est AA: 35 mL/min/{1.73_m2} — ABNORMAL LOW (ref >60)
GFR est non-AA: 29 mL/min/{1.73_m2} — ABNORMAL LOW (ref >60)
Glucose: 79 mg/dL (ref 65–100)
Potassium: 3.6 mmol/L (ref 3.5–5.1)
Sodium: 140 mmol/L (ref 136–145)

## 2019-12-18 LAB — FERRITIN
Ferritin: 1246 NG/ML — ABNORMAL HIGH (ref 26–388)
Ferritin: 1246 NG/ML — ABNORMAL HIGH (ref 26–388)

## 2019-12-18 LAB — FOLATE
Folate: 11.4 ng/mL (ref 5.0–21.0)
Folate: 11.4 ng/mL (ref 5.0–21.0)

## 2019-12-18 LAB — RBC, ALLOCATE

## 2019-12-18 LAB — HGB & HCT
HCT: 21.3 % — ABNORMAL LOW (ref 35.0–47.0)
HGB: 7.1 g/dL — ABNORMAL LOW (ref 11.5–16.0)

## 2019-12-18 LAB — IRON AND TIBC
Iron % Saturation: 32 % (ref 20–50)
Iron: 49 ug/dL (ref 35–150)
TIBC: 154 ug/dL — ABNORMAL LOW (ref 250–450)

## 2019-12-18 LAB — CBC WITH AUTO DIFFERENTIAL
Basophils %: 1 % (ref 0–1)
Basophils Absolute: 0.1 10*3/uL (ref 0.0–0.1)
Eosinophils %: 13 % — ABNORMAL HIGH (ref 0–7)
Eosinophils Absolute: 1 10*3/uL — ABNORMAL HIGH (ref 0.0–0.4)
Granulocyte Absolute Count: 0.1 10*3/uL — ABNORMAL HIGH (ref 0.00–0.04)
Hematocrit: 20.8 % — ABNORMAL LOW (ref 35.0–47.0)
Hemoglobin: 6.9 g/dL — ABNORMAL LOW (ref 11.5–16.0)
Immature Granulocytes %: 1 % — ABNORMAL HIGH (ref 0.0–0.5)
Lymphocytes %: 15 % (ref 12–49)
Lymphocytes Absolute: 1.1 10*3/uL (ref 0.8–3.5)
MCH: 30.8 PG (ref 26.0–34.0)
MCHC: 33.2 g/dL (ref 30.0–36.5)
MCV: 92.9 FL (ref 80.0–99.0)
MPV: 10.6 FL (ref 8.9–12.9)
Monocytes %: 5 % (ref 5–13)
Monocytes Absolute: 0.4 10*3/uL (ref 0.0–1.0)
NRBC Absolute: 0 10*3/uL (ref 0.00–0.01)
Neutrophils %: 65 % (ref 32–75)
Neutrophils Absolute: 4.7 10*3/uL (ref 1.8–8.0)
Nucleated RBCs: 0 PER 100 WBC
Platelets: 256 10*3/uL (ref 150–400)
RBC: 2.24 M/uL — ABNORMAL LOW (ref 3.80–5.20)
RDW: 14.5 % (ref 11.5–14.5)
WBC: 7.4 10*3/uL (ref 3.6–11.0)

## 2019-12-18 LAB — BASIC METABOLIC PANEL
Anion Gap: 7 mmol/L (ref 5–15)
BUN/Creatinine Ratio: 11 — ABNORMAL LOW (ref 12–20)
BUN: 20 MG/DL (ref 6–20)
CO2: 20 mmol/L — ABNORMAL LOW (ref 21–32)
Calcium: 7.7 MG/DL — ABNORMAL LOW (ref 8.5–10.1)
Chloride: 113 mmol/L — ABNORMAL HIGH (ref 97–108)
Creatinine: 1.75 MG/DL — ABNORMAL HIGH (ref 0.55–1.02)
GFR African American: 35 mL/min/{1.73_m2} — ABNORMAL LOW (ref >60)
Glucose: 79 mg/dL (ref 65–100)
Potassium: 3.6 mmol/L (ref 3.5–5.1)
Sodium: 140 mmol/L (ref 136–145)
eGFR NON-AA: 29 mL/min/{1.73_m2} — ABNORMAL LOW (ref >60)

## 2019-12-18 LAB — POCT GLUCOSE
POC Glucose: 124 mg/dL — ABNORMAL HIGH (ref 65–100)
POC Glucose: 85 mg/dL (ref 65–100)
POC Glucose: 90 mg/dL (ref 65–100)
POC Glucose: 87 mg/dL (ref 65–100)

## 2019-12-18 LAB — HEMOGLOBIN AND HEMATOCRIT
Hematocrit: 21.3 % — ABNORMAL LOW (ref 35.0–47.0)
Hemoglobin: 7.1 g/dL — ABNORMAL LOW (ref 11.5–16.0)

## 2019-12-18 MED ORDER — SODIUM CHLORIDE 0.9 % IV
INTRAVENOUS | Status: DC | PRN
Start: 2019-12-18 — End: 2019-12-18

## 2019-12-18 MED FILL — SENNA LAX 8.6 MG TABLET: 8.6 mg | ORAL | Qty: 1

## 2019-12-18 MED FILL — OXYCODONE 5 MG TAB: 5 mg | ORAL | Qty: 1

## 2019-12-18 MED FILL — DOK 100 MG CAPSULE: 100 mg | ORAL | Qty: 1

## 2019-12-18 MED FILL — GABAPENTIN 100 MG CAP: 100 mg | ORAL | Qty: 1

## 2019-12-18 MED FILL — AMLODIPINE 5 MG TAB: 5 mg | ORAL | Qty: 1

## 2019-12-18 MED FILL — SODIUM CHLORIDE 0.9 % IV: INTRAVENOUS | Qty: 250

## 2019-12-18 MED FILL — INSULIN GLARGINE 100 UNIT/ML INJECTION: 100 unit/mL | SUBCUTANEOUS | Qty: 1

## 2019-12-18 MED FILL — LACTULOSE 20 GRAM/30 ML ORAL SOLUTION: 20 gram/30 mL | ORAL | Qty: 30

## 2019-12-18 MED FILL — ACETAMINOPHEN 325 MG TABLET: 325 mg | ORAL | Qty: 2

## 2019-12-18 MED FILL — GABAPENTIN 300 MG CAP: 300 mg | ORAL | Qty: 1

## 2019-12-18 MED FILL — ASPIRIN 81 MG TAB, DELAYED RELEASE: 81 mg | ORAL | Qty: 1

## 2019-12-18 MED FILL — PANTOPRAZOLE 40 MG TAB, DELAYED RELEASE: 40 mg | ORAL | Qty: 1

## 2019-12-18 NOTE — Progress Notes (Signed)
ID Progress Note  12/18/2019       Left tma 3/18    Subjective:       Afebrile. No complaints.     Objective:     Vitals:   Visit Vitals  BP 129/80 (BP 1 Location: Right upper arm, BP Patient Position: At rest)   Pulse 76   Temp 98 ??F (36.7 ??C)   Resp 16   Ht 5' 5"  (1.651 m)   Wt 81.9 kg (180 lb 9.6 oz)   SpO2 99%   BMI 30.05 kg/m??        Tmax:  Temp (24hrs), Avg:98.2 ??F (36.8 ??C), Min:98 ??F (36.7 ??C), Max:98.3 ??F (36.8 ??C)      Exam:    Not in distress  Lung clear, no rales, wheezes or rhonchi   Heart: s1, s2, RRR, no murmurs rubs or clicks  Abdomen: soft nontender, no guarding or rebound  Left foot  bandaged.     Labs:   Lab Results   Component Value Date/Time    WBC 7.4 12/18/2019 03:57 AM    HGB 7.1 (L) 12/18/2019 07:51 AM    HCT 21.3 (L) 12/18/2019 07:51 AM    PLATELET 256 12/18/2019 03:57 AM    MCV 92.9 12/18/2019 03:57 AM     Lab Results   Component Value Date/Time    Sodium 140 12/18/2019 03:57 AM    Potassium 3.6 12/18/2019 03:57 AM    Chloride 113 (H) 12/18/2019 03:57 AM    CO2 20 (L) 12/18/2019 03:57 AM    Anion gap 7 12/18/2019 03:57 AM    Glucose 79 12/18/2019 03:57 AM    BUN 20 12/18/2019 03:57 AM    Creatinine 1.75 (H) 12/18/2019 03:57 AM    BUN/Creatinine ratio 11 (L) 12/18/2019 03:57 AM    GFR est AA 35 (L) 12/18/2019 03:57 AM    GFR est non-AA 29 (L) 12/18/2019 03:57 AM    Calcium 7.7 (L) 12/18/2019 03:57 AM    Bilirubin, total 0.2 11/23/2019 01:32 PM    Alk. 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  #2 hyperlipidemia  ??  #3 hypertension  ??  #4 diabetes  ??  #5 new onset fever - resolved    #6 AKI     Recommendations:       Had TMA 3/18. Pathology of proximal margin shows no osteo.      MOnitor off abx. Will sign  off. Please call with questions.       Chao Blazejewski Sallyanne Havers, MD

## 2019-12-18 NOTE — Progress Notes (Signed)
[address], [address]  ???? [place] [address]  Phone: [phone]??????[phone]   ??  Nephrology Progress Note  Ricardo Mairs     February 20, 1951     [id number]  Date of Admission : 11/23/2019  12/18/19    CC:  Follow up for ARF     Assessment and Plan   AKI :  - 2/2 AIN / ATIN from vanc and zosyn.   - urine eosinophils have a sensitivity of only 40-60%   - resolving   - off Abx   ??  Left Foot Osteomyelitis  - S/P TMA 3/18   - On IV zosyn. Off IV vanc   - abx per ID    ??  Type II DM   ??  HTN   - hold ARB and thiazide diuretic   ??  GERD    Anemia   - being transfused   ??  Care Plan discussed with: pt     Interval History:  Seen and examined   For d/c today post transfusion   Cr down to 1.7   Good UOP     Review of Systems: A comprehensive review of systems was negative except for that written in the HPI.    Current Medications:   Current Facility-Administered Medications   Medication Dose Route Frequency   ??? 0.9% sodium chloride infusion 250 mL  250 mL IntraVENous PRN   ??? enoxaparin (LOVENOX) injection 30 mg  30 mg SubCUTAneous Q24H   ??? acetaminophen (TYLENOL) suppository 650 mg  650 mg Rectal Q6H PRN   ??? amLODIPine (NORVASC) tablet 5 mg  5 mg Oral DAILY   ??? [Held by provider] losartan (COZAAR) tablet 50 mg  50 mg Oral DAILY   ??? lactulose (CHRONULAC) 10 gram/15 mL solution 30 mL  20 g Oral BID   ??? alteplase (CATHFLO) 1 mg in sterile water (preservative free) 1 mL injection  1 mg InterCATHeter PRN   ??? insulin glargine (LANTUS) injection 12 Units  12 Units SubCUTAneous QHS   ??? acetaminophen (TYLENOL) tablet 650 mg  650 mg Oral Q6H   ??? gabapentin (NEURONTIN) capsule 300 mg  300 mg Oral QHS   ??? gabapentin (NEURONTIN) capsule 100 mg  100 mg Oral DAILY   ??? sodium chloride (NS) flush 5-40 mL  5-40 mL IntraVENous Q8H   ??? sodium chloride (NS) flush 5-40 mL  5-40 mL IntraVENous PRN   ??? sodium phosphate (FLEET'S) enema 1 Enema  1 Enema Rectal PRN   ??? oxyCODONE IR (ROXICODONE) tablet 5 mg  5 mg  Oral Q4H PRN   ??? docusate sodium (COLACE) capsule 100 mg  100 mg Oral BID   ??? senna (SENOKOT) tablet 8.6 mg  1 Tab Oral DAILY   ??? polyethylene glycol (MIRALAX) packet 17 g  17 g Oral DAILY PRN   ??? bisacodyL (DULCOLAX) suppository 10 mg  10 mg Rectal DAILY PRN   ??? [Held by provider] hydroCHLOROthiazide (HYDRODIURIL) tablet 25 mg  25 mg Oral DAILY   ??? sodium chloride (NS) flush 5-40 mL  5-40 mL IntraVENous Q8H   ??? sodium chloride (NS) flush 5-40 mL  5-40 mL IntraVENous PRN   ??? promethazine (PHENERGAN) tablet 12.5 mg  12.5 mg Oral Q6H PRN    Or   ??? ondansetron (ZOFRAN) injection 4 mg  4 mg IntraVENous Q6H PRN   ??? glucose chewable tablet 16 g  4 Tab Oral PRN   ??? dextrose (  D50W) injection syrg 12.5-25 g  25-50 mL IntraVENous PRN   ??? glucagon (GLUCAGEN) injection 1 mg  1 mg IntraMUSCular PRN   ??? insulin lispro (HUMALOG) injection   SubCUTAneous AC&HS   ??? aspirin delayed-release tablet 81 mg  81 mg Oral DAILY   ??? pantoprazole (PROTONIX) tablet 40 mg  40 mg Oral ACB      Allergies   Allergen Reactions   ??? Neuromuscular Blockers, Steroidal Other (comments)     GI Upset       Objective:  Vitals:    Vitals:    12/17/19 0841 12/17/19 1424 12/18/19 0425 12/18/19 0912   BP: 126/65 131/73 124/72 129/80   Pulse: 86 81 79 76   Resp: 16 16 16 16    Temp: 98.4 ??F (36.9 ??C) 98.3 ??F (36.8 ??C) 98.2 ??F (36.8 ??C) 98 ??F (36.7 ??C)   SpO2: 98% 100% 97% 99%   Weight:       Height:         Intake and Output:  No intake/output data recorded.  03/29 1901 - 03/31 0700  In: 2050 [I.V.:2050]  Out: 700 [Urine:700]    Physical Examination:  General:  No apparent Distress  HEENT: PERRL, + Pallor , No Icterus  Neck: Supple,no mass palpable  Lungs : CTA  CVS: RRR, S1 S2 normal, No murmur   Abdomen: Soft, NT, BS +  Extremities: LLE - foot in dressing and LE edema +  Skin: No rash or lesions.  MS: No joint swelling, erythema, warmth  Neurologic: non focal, AAO x 3  Psych: normal affect/  ??  []     High complexity decision making was performed  []     Patient  is at high-risk of decompensation with multiple organ involvement    Lab Data Personally Reviewed: I have reviewed all the pertinent labs, microbiology data and radiology studies during assessment.    Recent Labs     12/18/19  0357 12/17/19  0107 12/16/19  0522   NA 140 137 134*   K 3.6 3.7 3.5   CL 113* 107 105   CO2 20* 20* 18*   GLU 79 74 110*   BUN 20 24* 25*   CREA 1.75* 2.09* 2.31*   CA 7.7* 8.2* 8.2*     Recent Labs     12/18/19  0751 12/18/19  0357 12/17/19  0107 12/16/19  0522   WBC  --  7.4 12.1* 12.6*   HGB 7.1* 6.9* 8.3* 8.5*   HCT 21.3* 20.8* 25.1* 25.9*   PLT  --  256 318 309     No results found for: SDES  Lab Results   Component Value Date/Time    Culture result: NO GROWTH 5 DAYS 12/12/2019 04:20 PM    Culture result: NO GROWTH 5 DAYS 12/07/2019 07:27 AM    Culture result: NO ANAEROBES ISOLATED 12/05/2019 05:30 PM    Culture result: LIGHT PSEUDOMONAS AERUGINOSA (A) 12/05/2019 05:30 PM    Culture result: NO GROWTH 5 DAYS 11/23/2019 01:49 PM     Recent Results (from the past 24 hour(s))   GLUCOSE, POC    Collection Time: 12/17/19 11:52 AM   Result Value Ref Range    Glucose (POC) 93 65 - 100 mg/dL    Performed by Sandi Raveling    GLUCOSE, POC    Collection Time: 12/17/19  4:05 PM   Result Value Ref Range    Glucose (POC) 98 65 - 100 mg/dL    Performed  by Sandi Raveling    GLUCOSE, POC    Collection Time: 12/17/19  9:06 PM   Result Value Ref Range    Glucose (POC) 124 (H) 65 - 100 mg/dL    Performed by [place], BASIC    Collection Time: 12/18/19  3:57 AM   Result Value Ref Range    Sodium 140 136 - 145 mmol/L    Potassium 3.6 3.5 - 5.1 mmol/L    Chloride 113 (H) 97 - 108 mmol/L    CO2 20 (L) 21 - 32 mmol/L    Anion gap 7 5 - 15 mmol/L    Glucose 79 65 - 100 mg/dL    BUN 20 6 - 20 MG/DL    Creatinine 1.75 (H) 0.55 - 1.02 MG/DL    BUN/Creatinine ratio 11 (L) 12 - 20      GFR est AA 35 (L) >60 ml/min/1.67m2    GFR est non-AA 29 (L) >60 ml/min/1.102m2    Calcium 7.7 (L) 8.5 - 10.1  MG/DL   CBC WITH AUTOMATED DIFF    Collection Time: 12/18/19  3:57 AM   Result Value Ref Range    WBC 7.4 3.6 - 11.0 K/uL    RBC 2.24 (L) 3.80 - 5.20 M/uL    HGB 6.9 (L) 11.5 - 16.0 g/dL    HCT 20.8 (L) 35.0 - 47.0 %    MCV 92.9 80.0 - 99.0 FL    MCH 30.8 26.0 - 34.0 PG    MCHC 33.2 30.0 - 36.5 g/dL    RDW 14.5 11.5 - 14.5 %    PLATELET 256 150 - 400 K/uL    MPV 10.6 8.9 - 12.9 FL    NRBC 0.0 0 PER 100 WBC    ABSOLUTE NRBC 0.00 0.00 - 0.01 K/uL    NEUTROPHILS 65 32 - 75 %    LYMPHOCYTES 15 12 - 49 %    MONOCYTES 5 5 - 13 %    EOSINOPHILS 13 (H) 0 - 7 %    BASOPHILS 1 0 - 1 %    IMMATURE GRANULOCYTES 1 (H) 0.0 - 0.5 %    ABS. NEUTROPHILS 4.7 1.8 - 8.0 K/UL    ABS. LYMPHOCYTES 1.1 0.8 - 3.5 K/UL    ABS. MONOCYTES 0.4 0.0 - 1.0 K/UL    ABS. EOSINOPHILS 1.0 (H) 0.0 - 0.4 K/UL    ABS. BASOPHILS 0.1 0.0 - 0.1 K/UL    ABS. IMM. GRANS. 0.1 (H) 0.00 - 0.04 K/UL    DF SMEAR SCANNED      RBC COMMENTS ANISOCYTOSIS  1+        RBC COMMENTS OVALOCYTES  1+        RBC COMMENTS POLYCHROMASIA  PRESENT       GLUCOSE, POC    Collection Time: 12/18/19  7:49 AM   Result Value Ref Range    Glucose (POC) 85 65 - 100 mg/dL    Performed by Marisa Sprinkles  Travel RN    HGB & HCT    Collection Time: 12/18/19  7:51 AM   Result Value Ref Range    HGB 7.1 (L) 11.5 - 16.0 g/dL    HCT 21.3 (L) 35.0 - 47.0 %   TYPE & SCREEN    Collection Time: 12/18/19  8:32 AM   Result Value Ref Range    Crossmatch Expiration 12/21/2019,2359     ABO/Rh(D) Jenetta Downer POSITIVE     Antibody screen NEG  Unit number [id number]     Blood component type RC LR     Unit division 00     Status of unit ALLOCATED     Crossmatch result Compatible    RBC, ALLOCATE    Collection Time: 12/18/19  9:15 AM   Result Value Ref Range    HISTORY CHECKED? Historical check performed            Total time spent with patient:  xxx   min.                               Care Plan discussed with:  Patient     Family      RN      Consulting Physician [place]        I have reviewed the flowsheets.   Chart and Pertinent Notes have been reviewed.   No change in PMH ,family and social history from Consult note.      Trisha Mangle, MD

## 2019-12-18 NOTE — Progress Notes (Signed)
TOC: Patient discharging to [facility] today with [facility] to transport at 5:15pm. Auth approved today by Barnett Applebaum from [facility]. MD notified and discharge order is in.    RUR: 26%    Spoke with [facility] and patient will be in room 101 and RN to call report to [phone]. CM following.    Nathanial Millman RN/CRM

## 2019-12-18 NOTE — Progress Notes (Signed)
TRANSFER - OUT REPORT:    Verbal report given to Gloria(name) on Dafnee Brodhead  being transferred to [facility](unit) for routine progression of care       Report consisted of patient???s Situation, Background, Assessment and   Recommendations(SBAR).     Information from the following report(s) SBAR was reviewed with the receiving nurse.    Lines:   Peripheral IV 12/18/19 Left;Lower Wrist (Active)   Site Assessment Clean;Dry 12/18/19 1335   Phlebitis Assessment 0 12/18/19 1335   Infiltration Assessment 0 12/18/19 1335   Dressing Status New;Occlusive 12/18/19 1335   Dressing Type Transparent 12/18/19 1335   Hub Color/Line Status Pink 12/18/19 1335   Alcohol Cap Used Yes 12/18/19 1335        Opportunity for questions and clarification was provided.      Patient transported with:   [facility]

## 2019-12-18 NOTE — Progress Notes (Signed)
TOC: Patient discharging to [facility] today with [facility] to transport at 5:15pm. Auth approved today by Almira Coaster from [facility]. MD notified and discharge order is in.    RUR: 26%    Spoke with [facility] and patient will be in room 101 and RN to call report to [phone]. CM following.    Estill Batten RN/CRM

## 2019-12-18 NOTE — Progress Notes (Signed)
ID Progress Note  12/18/2019       Left tma 3/18    Subjective:       Afebrile. No complaints.     Objective:     Vitals:   Visit Vitals  BP 129/80 (BP 1 Location: Right upper arm, BP Patient Position: At rest)   Pulse 76   Temp 98 ??F (36.7 ??C)   Resp 16   Ht 5' 5"  (1.651 m)   Wt 81.9 kg (180 lb 9.6 oz)   SpO2 99%   BMI 30.05 kg/m??        Tmax:  Temp (24hrs), Avg:98.2 ??F (36.8 ??C), Min:98 ??F (36.7 ??C), Max:98.3 ??F (36.8 ??C)      Exam:    Not in distress  Lung clear, no rales, wheezes or rhonchi   Heart: s1, s2, RRR, no murmurs rubs or clicks  Abdomen: soft nontender, no guarding or rebound  Left foot  bandaged.     Labs:   Lab Results   Component Value Date/Time    WBC 7.4 12/18/2019 03:57 AM    HGB 7.1 (L) 12/18/2019 07:51 AM    HCT 21.3 (L) 12/18/2019 07:51 AM    PLATELET 256 12/18/2019 03:57 AM    MCV 92.9 12/18/2019 03:57 AM     Lab Results   Component Value Date/Time    Sodium 140 12/18/2019 03:57 AM    Potassium 3.6 12/18/2019 03:57 AM    Chloride 113 (H) 12/18/2019 03:57 AM    CO2 20 (L) 12/18/2019 03:57 AM    Anion gap 7 12/18/2019 03:57 AM    Glucose 79 12/18/2019 03:57 AM    BUN 20 12/18/2019 03:57 AM    Creatinine 1.75 (H) 12/18/2019 03:57 AM    BUN/Creatinine ratio 11 (L) 12/18/2019 03:57 AM    GFR est AA 35 (L) 12/18/2019 03:57 AM    GFR est non-AA 29 (L) 12/18/2019 03:57 AM    Calcium 7.7 (L) 12/18/2019 03:57 AM    Bilirubin, total 0.2 11/23/2019 01:32 PM    Alk. 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  #2 hyperlipidemia  ??  #3 hypertension  ??  #4 diabetes  ??  #5 new onset fever - resolved    #6 AKI     Recommendations:       Had TMA 3/18. Pathology of proximal margin shows no osteo.      MOnitor off abx. Will sign  off. Please call with questions.       Dody Smartt Sallyanne Havers, MD

## 2019-12-18 NOTE — Progress Notes (Signed)
Progress Notes by Trisha Mangle, MD at 12/18/19 1100                Author: Trisha Mangle, MD  Service: Nephrology  Author Type: Physician       Filed: 12/18/19 1100  Date of Service: 12/18/19 1100  Status: Signed          Editor: Trisha Mangle, MD (Physician)                       [address], [address]   ???? [place] [address]   Phone: [phone]??????[phone]    ??   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    - urine eosinophils have a sensitivity of only 40-60%    - resolving    - off Abx    ??   Left Foot Osteomyelitis   - S/P TMA 3/18    - On IV zosyn. Off IV vanc    - abx per ID     ??   Type II DM    ??   HTN    - hold ARB and thiazide diuretic    ??   GERD      Anemia    - being transfused    ??   Care Plan discussed with: pt        Interval History:   Seen and examined    For d/c today post transfusion    Cr down to 1.7    Good UOP       Review of Systems: A comprehensive review of systems was negative except for that written in the HPI.      Current Medications:      Current Facility-Administered Medications          Medication  Dose  Route  Frequency           ?  0.9% sodium chloride infusion 250 mL   250 mL  IntraVENous  PRN     ?  enoxaparin (LOVENOX) injection 30 mg   30 mg  SubCUTAneous  Q24H     ?  acetaminophen (TYLENOL) suppository 650 mg   650 mg  Rectal  Q6H PRN     ?  amLODIPine (NORVASC) tablet 5 mg   5 mg  Oral  DAILY     ?  [Held by provider] losartan (COZAAR) tablet 50 mg   50 mg  Oral  DAILY     ?  lactulose (CHRONULAC) 10 gram/15 mL solution 30 mL   20 g  Oral  BID     ?  alteplase (CATHFLO) 1 mg in sterile water (preservative free) 1 mL injection   1 mg  InterCATHeter  PRN     ?  insulin glargine (LANTUS) injection 12 Units   12 Units   SubCUTAneous  QHS     ?  acetaminophen (TYLENOL) tablet 650 mg   650 mg  Oral  Q6H     ?  gabapentin (NEURONTIN) capsule 300 mg   300 mg  Oral  QHS     ?  gabapentin (NEURONTIN) capsule 100 mg   100 mg  Oral  DAILY     ?  sodium chloride (NS) flush 5-40 mL   5-40 mL  IntraVENous  Q8H     ?  sodium chloride (NS) flush 5-40 mL   5-40 mL  IntraVENous  PRN     ?  sodium phosphate (FLEET'S) enema 1 Enema   1 Enema  Rectal  PRN     ?  oxyCODONE IR (ROXICODONE) tablet 5 mg   5 mg  Oral  Q4H PRN     ?  docusate sodium (COLACE) capsule 100 mg   100 mg  Oral  BID     ?  senna (SENOKOT) tablet 8.6 mg   1 Tab  Oral  DAILY     ?  polyethylene glycol (MIRALAX) packet 17 g   17 g  Oral  DAILY PRN           ?  bisacodyL (DULCOLAX) suppository 10 mg   10 mg  Rectal  DAILY PRN           ?  [Held by provider] hydroCHLOROthiazide (HYDRODIURIL) tablet 25 mg   25 mg  Oral  DAILY     ?  sodium chloride (NS) flush 5-40 mL   5-40 mL  IntraVENous  Q8H     ?  sodium chloride (NS) flush 5-40 mL   5-40 mL  IntraVENous  PRN     ?  promethazine (PHENERGAN) tablet 12.5 mg   12.5 mg  Oral  Q6H PRN          Or           ?  ondansetron (ZOFRAN) injection 4 mg   4 mg  IntraVENous  Q6H PRN     ?  glucose chewable tablet 16 g   4 Tab  Oral  PRN     ?  dextrose (D50W) injection syrg 12.5-25 g   25-50 mL  IntraVENous  PRN     ?  glucagon (GLUCAGEN) injection 1 mg   1 mg  IntraMUSCular  PRN     ?  insulin lispro (HUMALOG) injection     SubCUTAneous  AC&HS     ?  aspirin delayed-release tablet 81 mg   81 mg  Oral  DAILY           ?  pantoprazole (PROTONIX) tablet 40 mg   40 mg  Oral  ACB           Allergies        Allergen  Reactions         ?  Neuromuscular Blockers, Steroidal  Other (comments)             GI Upset           Objective:   Vitals:       Vitals:             12/17/19 0841  12/17/19 1424  12/18/19 0425  12/18/19 0912           BP:  126/65  131/73  124/72  129/80     Pulse:  86  81  79  76     Resp:  16  16  16  16      Temp:  98.4 ??F (36.9 ??C)   98.3 ??F (36.8 ??C)  98.2 ??F (36.8 ??C)  98 ??F (36.7 ??C)  SpO2:  98%  100%  97%  99%     Weight:                   Height:                Intake and Output:   No intake/output data recorded.   03/29 1901 - 03/31 0700   In: 2050 [I.V.:2050]   Out: 700 [Urine:700]      Physical Examination:   General:  No apparent Distress   HEENT: PERRL, + Pallor , No Icterus   Neck: Supple,no mass palpable   Lungs : CTA   CVS: RRR, S1 S2 normal, No murmur    Abdomen: Soft, NT, BS +   Extremities: LLE - foot in dressing and LE edema +   Skin: No rash or lesions.   MS: No joint swelling, erythema, warmth   Neurologic: non focal, AAO x 3   Psych: normal affect/   ??   []      High complexity decision making was performed   []     Patient is at high-risk of  decompensation with multiple organ involvement      Lab Data Personally Reviewed: I have reviewed all the pertinent labs, microbiology data and radiology studies during assessment.        Recent Labs             12/18/19   0357  12/17/19   0107  12/16/19   0522     NA  140  137  134*     K  3.6  3.7  3.5     CL  113*  107  105     CO2  20*  20*  18*     GLU  79  74  110*     BUN  20  24*  25*     CREA  1.75*  2.09*  2.31*          CA  7.7*  8.2*  8.2*          Recent Labs              12/18/19   0751  12/18/19   0357  12/17/19   0107  12/16/19   0522     WBC   --   7.4  12.1*  12.6*     HGB  7.1*  6.9*  8.3*  8.5*     HCT  21.3*  20.8*  25.1*  25.9*           PLT   --   256  318  309        No results found for: SDES     Lab Results         Component  Value  Date/Time            Culture result:  NO GROWTH 5 DAYS  12/12/2019 04:20 PM       Culture result:  NO GROWTH 5 DAYS  12/07/2019 07:27 AM       Culture result:  NO ANAEROBES ISOLATED  12/05/2019 05:30 PM       Culture result:  LIGHT PSEUDOMONAS AERUGINOSA (A)  12/05/2019 05:30 PM            Culture result:  NO GROWTH 5 DAYS  11/23/2019 01:49 PM          Recent Results (from the past 24 hour(s))     GLUCOSE,  POC          Collection  Time: 12/17/19 11:52 AM         Result  Value  Ref Range            Glucose (POC)  93  65 - 100 mg/dL            Performed by  Sandi Raveling         GLUCOSE, POC          Collection Time: 12/17/19  4:05 PM         Result  Value  Ref Range            Glucose (POC)  98  65 - 100 mg/dL       Performed by  Sandi Raveling         GLUCOSE, POC          Collection Time: 12/17/19  9:06 PM         Result  Value  Ref Range            Glucose (POC)  124 (H)  65 - 100 mg/dL       Performed by  [place], BASIC          Collection Time: 12/18/19  3:57 AM         Result  Value  Ref Range            Sodium  140  136 - 145 mmol/L       Potassium  3.6  3.5 - 5.1 mmol/L       Chloride  113 (H)  97 - 108 mmol/L       CO2  20 (L)  21 - 32 mmol/L       Anion gap  7  5 - 15 mmol/L       Glucose  79  65 - 100 mg/dL       BUN  20  6 - 20 MG/DL       Creatinine  1.75 (H)  0.55 - 1.02 MG/DL       BUN/Creatinine ratio  11 (L)  12 - 20         GFR est AA  35 (L)  >60 ml/min/1.22m2       GFR est non-AA  29 (L)  >60 ml/min/1.8m2       Calcium  7.7 (L)  8.5 - 10.1 MG/DL       CBC WITH AUTOMATED DIFF          Collection Time: 12/18/19  3:57 AM         Result  Value  Ref Range            WBC  7.4  3.6 - 11.0 K/uL       RBC  2.24 (L)  3.80 - 5.20 M/uL       HGB  6.9 (L)  11.5 - 16.0 g/dL       HCT  20.8 (L)  35.0 - 47.0 %       MCV  92.9  80.0 - 99.0 FL       MCH  30.8  26.0 - 34.0 PG       MCHC  33.2  30.0 - 36.5 g/dL       RDW  14.5  11.5 - 14.5 %       PLATELET  256  150 - 400 K/uL       MPV  10.6  8.9 - 12.9 FL       NRBC  0.0  0 PER 100 WBC       ABSOLUTE NRBC  0.00  0.00 - 0.01 K/uL       NEUTROPHILS  65  32 - 75 %       LYMPHOCYTES  15  12 - 49 %       MONOCYTES  5  5 - 13 %       EOSINOPHILS  13 (H)  0 - 7 %       BASOPHILS  1  0 - 1 %       IMMATURE GRANULOCYTES  1 (H)  0.0 - 0.5 %       ABS. NEUTROPHILS  4.7  1.8 - 8.0 K/UL       ABS. LYMPHOCYTES  1.1  0.8 - 3.5 K/UL       ABS. MONOCYTES  0.4  0.0 - 1.0 K/UL        ABS. EOSINOPHILS  1.0 (H)  0.0 - 0.4 K/UL       ABS. BASOPHILS  0.1  0.0 - 0.1 K/UL       ABS. IMM. GRANS.  0.1 (H)  0.00 - 0.04 K/UL       DF  SMEAR SCANNED          RBC COMMENTS  ANISOCYTOSIS   1+             RBC COMMENTS  OVALOCYTES   1+             RBC COMMENTS  POLYCHROMASIA   PRESENT             GLUCOSE, POC          Collection Time: 12/18/19  7:49 AM         Result  Value  Ref Range            Glucose (POC)  85  65 - 100 mg/dL       Performed by  Marisa Sprinkles  Travel RN         HGB & HCT          Collection Time: 12/18/19  7:51 AM         Result  Value  Ref Range            HGB  7.1 (L)  11.5 - 16.0 g/dL       HCT  21.3 (L)  35.0 - 47.0 %       TYPE & SCREEN          Collection Time: 12/18/19  8:32 AM         Result  Value  Ref Range            Crossmatch Expiration  12/21/2019,2359         ABO/Rh(D)  Jenetta Downer POSITIVE         Antibody screen  NEG         Unit number  [id number]         Blood component type  RC LR         Unit division  00         Status of unit  ALLOCATED         Crossmatch result  Compatible         RBC, ALLOCATE  Collection Time: 12/18/19  9:15 AM         Result  Value  Ref Range            HISTORY CHECKED?  Historical check performed                   Total time spent with patient:  xxx    min.                                Care Plan discussed with:      Patient        Family         RN            Consulting Physician [place]            I have reviewed the flowsheets.   Chart and Pertinent Notes have been  reviewed.    No change in PMH ,family and social history from Consult note.         Trisha Mangle, MD

## 2019-12-18 NOTE — Progress Notes (Signed)
TRANSFER - OUT REPORT:    Verbal report given to Gloria(name) on Lindsey Huynh  being transferred to [facility](unit) for routine progression of care       Report consisted of patient's Situation, Background, Assessment and   Recommendations(SBAR).     Information from the following report(s) SBAR was reviewed with the receiving nurse.    Lines:   Peripheral IV 12/18/19 Left;Lower Wrist (Active)   Site Assessment Clean;Dry 12/18/19 1335   Phlebitis Assessment 0 12/18/19 1335   Infiltration Assessment 0 12/18/19 1335   Dressing Status New;Occlusive 12/18/19 1335   Dressing Type Transparent 12/18/19 1335   Hub Color/Line Status Pink 12/18/19 1335   Alcohol Cap Used Yes 12/18/19 1335        Opportunity for questions and clarification was provided.      Patient transported with:   [facility]

## 2019-12-18 NOTE — Progress Notes (Signed)
Bedside and Verbal shift change report given to Preston (oncoming nurse) by Genelle Bal (offgoing nurse). Report included the following information SBAR, Kardex and MAR.

## 2019-12-18 NOTE — Discharge Summary (Signed)
Discharge Summary by Cheryll Cockayne, MD at 12/18/19 1500                Author: Cheryll Cockayne, MD  Service: Hospitalist  Author Type: Physician       Filed: 12/18/19 1632  Date of Service: 12/18/19 1500  Status: Signed          Editor: Cheryll Cockayne, MD (Physician)                                                                                                        Discharge Instructions/Summary        Patient: Lindsey Huynh        MRN: [id number]        Date of birth: 1951-03-27        Age: 69 y.o.       Date of admission:  11/23/2019      Date of discharge:  12/18/2019      Primary care provider:  Karsten Fells, NP        Admitting provider:  Donato Schultz, MD      Discharging provider:  Cheryll Cockayne, MD , to contact this individual call [phone]  and ask the operator to page, if unavailable ask for the triage hospitalist to be paged.      Consultations   IP CONSULT TO VASCULAR SURGERY   IP CONSULT TO PODIATRY   IP CONSULT TO INFECTIOUS DISEASES   IP CONSULT TO INFECTIOUS DISEASES   IP CONSULT TO NEPHROLOGY   IP CONSULT TO CARDIOLOGY      Procedures/Surgeries   Procedure(s):   LEFT TRANSMETATARSAL AMPUTATION WITH TENDON BALANCING      Discharge destination: SNF- [facility].  The patient is stable for discharge.      Admission diagnosis   Gangrene ([place]) [place] COURSE:   Per HPI: Xi Lebeda??is a 69 y.o.??female??with past medical history of diabetes, insulin-dependent, GERD and hypertension comes for return of left foot pain and  swelling.   ??   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 First distal phalanx osteomyelitis.   - completed cefepime. Appreciate ID input.    - surgical margin clear   - Vascular surgery: follow up in 2 weeks post-op   - appreciate Podiatry input    - c/w aspirin        Fever 3/25 and 3/26 - unclear etiology    - normal wbc, lactic, procalcitonin   - UA clean. CXR: normal   - rapid covid and pcr negative    - blood cx : NGTD   - surgical site - no drainage   - appreciate ID input   - discontinued vanco 3/28 and zosyn 3/29   - afebrile >48hrs   - monitored off abx    ??   AKI - cr improving    -  ATN due to abx   - renal US normal    - s/p IVF   - appreciate nephrology input   - will monitor renal function    ??   Anemia   -  No signs of active bleeding noted.    - s/p PRBC 3/31 due to hb 6.9, rpt hb 7.1    - iron profile normal    - Monitor labs and transfuse if hgb <7.0??   ??   DMII:??hold metformin while IP   -Cont basal insulin, SSI   -Diabetic diet   - A1c 8.1   - DM following??   ??   HTN   -Continue amlodipine. discontinuing Cozaar and HCTZ due to soft BP    ??   GERD   -Cont PPI           Current Discharge Medication List              START taking these medications          Details        acetaminophen (TYLENOL) 325 mg tablet  Take 2 Tabs by mouth every six (6) hours as needed for Pain.   Qty: 10 Tab, Refills:  0               docusate sodium (COLACE) 100 mg capsule  Take 1 Cap by mouth two (2) times daily as needed for Constipation for up to 90 days.   Qty: 60 Cap, Refills:  0                     CONTINUE these medications which have CHANGED          Details        insulin detemir U-100 (Levemir FlexTouch U-100 Insuln) 100 unit/mL (3 mL) inpn  12 Units by SubCUTAneous route daily (with breakfast).   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   Qty: 30 Tab, Refills:  0          Associated Diagnoses: Essential hypertension               gabapentin (NEURONTIN) 300 mg capsule  Take 1 Cap by mouth nightly. Max Daily Amount: 300 mg.   Qty: 30 Cap, Refills:  0          Associated Diagnoses: Diabetic polyneuropathy associated with type 2 diabetes mellitus (HCC)               Insulin Needles, Disposable, (Nano Pen Needle) 32 gauge x 5/32" ndle  Use with insulin pen 3 times daily  Qty: 100 Pen Needle, Refills:  0               cyclopentolate (CYCLOGYL) 1 % ophthalmic solution                 aspirin delayed-release 81 mg tablet  Take 81 mg by mouth daily.               omeprazole (PRILOSEC) 20 mg capsule  Take 20 mg by mouth daily.                     STOP taking these medications                  oxyCODONE IR (ROXICODONE) 5 mg immediate release tablet  Comments:    Reason for Stopping:                      valsartan-hydroCHLOROthiazide (DIOVAN-HCT) 160-25 mg per tablet  Comments:    Reason for Stopping:                                FOLLOW-UP CARE RECOMMENDATIONS/TESTING/NURSING ORDERS:   ??  PT/OT   ??  Cbc, bmp in 3 days          DIET:   Cardiac Diet and Diabetic Diet      ACTIVITY:  Activity as  tolerated, PT/OT to evaluate and treat and OOB for meals      APPOINTMENTS:   ??  Follow-up with primary care provider, Dr.  Ermalinda Barrios, Gae Gallop, NP  -  Please call to set up an appointment  to be seen in  1 week    ??  Podiatry in 1 week   ??  Vascular surgery in 2 weeks   ??  Cardiology in 1 -2 weeks          PENDING TEST RESULTS:   At the time of discharge the following test results are still pending: none .    Please review these results as they become available.      Specific symptoms to watch for: chest pain, shortness of breath, f ever, chills, nausea, vomiting, diarrhea, change in mentation, falling, weakness,  bleeding .      GOALS OF CARE:      X   Eventual return to home/independent/assisted living           Long term SNF            Hospice           No rehospitalization        SOCIAL HISTORY:        Social History          Socioeconomic History         ?  Marital status:  SINGLE              Spouse name:  Not on file         ?  Number of children:  Not on file     ?  Years of education:  Not on file     ?  Highest education level:  Not on file       Occupational History        ?  Not on file       Social Needs         ?  Financial resource strain:  Not on  file        ?  Food insecurity              Worry:  Not on file         Inability:  Not on file        ?  Transportation needs              Medical:  Not on file         Non-medical:  Not on file       Tobacco Use         ?  Smoking status:  Former Smoker              Years:  10.00         ?  Smokeless tobacco:  Never Used       Substance and Sexual Activity         ?  Alcohol use:  Yes              Alcohol/week:  5.0 standard drinks         Types:  6 Cans of beer per week         ?  Drug use:  No     ?  Sexual activity:  Not Currently       Lifestyle        ?  Physical activity              Days per week:  Not on file         Minutes per session:  Not on file         ?  Stress:  Not on file       Relationships        ?  Social Health visitor on phone:  Not on file         Gets together:  Not on file         Attends religious service:  Not on file         Active member of club or organization:  Not on file         Attends meetings of clubs or organizations:  Not on file         Relationship status:  Not on file        ?  Intimate partner violence              Fear of current or ex partner:  Not on file         Emotionally abused:  Not on file         Physically abused:  Not on file         Forced sexual activity:  Not on file        Other Topics  Concern        ?  Not on file       Social History Narrative        ?  Not on file           Patient  condition at discharge:    Functional status         Poor      X   Deconditioned         Independent     Cognition         Lucid  Forgetful (some sensescence)           Dementia     Catheters/lines (plus indication)         Foley           PICC            PEG                 Code status      X   Full code            DNR             CHRONIC MEDICAL CONDITIONS:      Problem List  as of 12/18/2019  Date Reviewed:  June 09, 2018                        Codes  Class  Noted - Resolved             Gangrene ([place])  ICD-10-CM: [id number]   ICD-9-CM: 785.4    11/23/2019 - Present                       Type 2 diabetes mellitus with diabetic neuropathy ([place])  ICD-10-CM: E11.40   ICD-9-CM: 250.60, 357.2    10/30/2018 - Present                       Type 2 diabetes with nephropathy ([place])  ICD-10-CM: E11.21   ICD-9-CM: 250.40, 583.81    02/23/2017 - Present                       Glaucoma of left eye  ICD-10-CM: H40.9   ICD-9-CM: 365.9    06/03/2016 - Present                       Cataract of left eye  ICD-10-CM: H26.9   ICD-9-CM: 366.9    06/03/2016 - Present                       Postmenopausal  ICD-10-CM: Z78.0   ICD-9-CM: V49.81    02/18/2014 - Present                       Constipation  ICD-10-CM: K59.00   ICD-9-CM: 564.00    01/03/2013 - Present                       HTN (hypertension)  ICD-10-CM: I10   ICD-9-CM: 401.9    12/11/2012 - Present                       GERD (gastroesophageal reflux disease)  ICD-10-CM: K21.9   ICD-9-CM: 530.81    12/11/2012 - Present                       Hypercholesterolemia  ICD-10-CM: E78.00   ICD-9-CM: 272.0    12/11/2012 - Present                       Diabetes ([place])  ICD-10-CM: E11.9   ICD-9-CM: 250.00    12/11/2012 - Present                                Physical examination at  discharge   Visit Vitals      BP  (!) 162/83 (BP 1 Location: Right upper arm, BP Patient Position: At rest)     Pulse  76     Temp  98.1 ??F (36.7 ??C)     Resp  16     Ht  5\' 5"  (1.651 m)     Wt  81.9 kg (180 lb 9.6 oz)     SpO2  99%         BMI  30.05 kg/m??           Constitutional:   No acute distress, cooperative     ENT:   Oral mucosa moist, oropharynx benign.      Resp:   CTA bilaterally.     CV:   Regular rhythm, normal rate,       GI:   Soft, non distended, non tender.bs+      Musculoskeletal:   surgical site intact       Neurologic:   Moves all extremities.  AAOx3, CN II-XII reviewed, follows commands                           Psych:  Good insight, Not anxious nor agitated.    ??         Significant Diagnostic Studies:    11/23/2019: BUN 17 MG/DL (Ref range: 6 - 20 MG/DL); Calcium 10.0 MG/DL (Ref range: 8.5 - 10.1 MG/DL); CO2 30 mmol/L (Ref range: 21 - 32 mmol/L); Creatinine 0.74 MG/DL  (Ref range: 0.55 - 1.02 MG/DL); Glucose PLEASE DISREGARD RESULTS mg/dL (Ref range: 50 - 100 mg/dL); HCT 34.3 %* (Ref range: 35.0 - 47.0 %); HGB 11.7 g/dL (Ref range: 11.5 - 16.0 g/dL); Potassium 3.2 mmol/L* (Ref range: 3.5 - 5.1 mmol/L); Sodium 138 mmol/L  (Ref range: 136 - 145 mmol/L)   11/24/2019: BUN 11 MG/DL (Ref range: 6 - 20 MG/DL); Calcium 9.8 MG/DL (Ref range: 8.5 - 10.1 MG/DL); CO2 27 mmol/L (Ref range: 21 - 32 mmol/L); Creatinine 0.69 MG/DL (Ref range: 0.55 - 1.02 MG/DL); Glucose 95 mg/dL (Ref range: 65 - 100 mg/dL); HCT 33.2  %* (Ref range: 35.0 - 47.0 %); HGB 11.6 g/dL (Ref range: 11.5 - 16.0 g/dL); Potassium 3.1 mmol/L* (Ref range: 3.5 - 5.1 mmol/L); Sodium 137 mmol/L (Ref range: 136 - 145 mmol/L)     Recent Labs             12/18/19   0751  12/18/19   0357  12/17/19   0107     WBC   --   7.4  12.1*     HGB  7.1*  6.9*  8.3*     HCT  21.3*  20.8*  25.1*          PLT   --   256  318          Recent Labs             12/18/19   0357  12/17/19   0107  12/16/19   0522     NA  140  137  134*     K  3.6  3.7  3.5     CL  113*  107  105     CO2  20*  20*  18*     BUN  20  24*  25*     CREA  1.75*  2.09*  2.31*     GLU  79  74  110*          CA  7.7*  8.2*  8.2*        No results for input(s): AP, TBIL, TP, ALB, GLOB, GGT, AML, LPSE in the last 72 hours.       No lab exists for component: SGOT, GPT, AMYP, HLPSE   No results for input(s): INR, PTP, APTT, INREXT, INREXT in the last 72 hours.    No results for input(s): FE, TIBC, PSAT, FERR in the last 72 hours.    No results for input(s): PH, PCO2, PO2 in the last 72 hours.   No results for input(s): CPK, CKMB in the last 72 hours.      No lab exists for component: TROPONINI   No components found for: [facility]      Pertinent imaging studies:      Per EMR          Time spent on discharge related activities today greater than 30 minutes.         Signed:  Cheryll Cockayne, MD                  Hospitalist                  12/18/2019                  1:29 PM            Cc: Karsten Fells, NP

## 2019-12-18 NOTE — Progress Notes (Signed)
Bedside and Verbal shift change report given to K[place] (oncoming nurse) by Genelle Bal (offgoing nurse). Report included the following information SBAR, Kardex and MAR.

## 2019-12-18 NOTE — Discharge Summary (Signed)
Discharge Instructions/Summary     Patient: Lindsey Huynh       MRN: [id number]       Date of birth: [birth year]       Age: 69 y.o.     Date of admission:  11/23/2019    Date of discharge:  12/18/2019    Primary care provider:  Karsten Fells, NP     Admitting provider:  Donato Schultz, MD    Discharging provider:  Cheryll Cockayne, MD , to contact this individual call [phone] and ask the operator to page, if unavailable ask for the triage hospitalist to be paged.    Consultations  IP CONSULT TO VASCULAR SURGERY  IP CONSULT TO PODIATRY  IP CONSULT TO INFECTIOUS DISEASES  IP CONSULT TO INFECTIOUS DISEASES  IP CONSULT TO NEPHROLOGY  IP CONSULT TO CARDIOLOGY    Procedures/Surgeries  Procedure(s):  LEFT TRANSMETATARSAL AMPUTATION WITH TENDON BALANCING    Discharge destination: SNF- [place].  The patient is stable for discharge.    Admission diagnosis  Gangrene ([place]) [place] COURSE:  Per HPI: Lindsey Huynh??is a 69 y.o.??female??with past medical history of diabetes, insulin-dependent, GERD and hypertension comes for return of left foot pain and swelling.  ??  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 First distal phalanx osteomyelitis.  - completed cefepime. Appreciate ID input.   - surgical margin clear  - Vascular surgery: follow up in 2 weeks post-op  - appreciate Podiatry input   - c/w aspirin     Fever 3/25 and 3/26 - unclear etiology   - normal wbc, lactic, procalcitonin  - UA clean. CXR: normal  - rapid covid and pcr negative   - blood cx : NGTD  - surgical site - no drainage  - appreciate ID input  - discontinued vanco 3/28 and zosyn 3/29  - afebrile >48hrs  - monitored off abx   ??  AKI - cr improving   - ATN due to abx  - renal US normal    - s/p IVF  - appreciate nephrology input  - will monitor renal function   ??  Anemia  -  No signs of active bleeding noted.   - s/p PRBC 3/31 due to hb 6.9, rpt hb 7.1   - iron profile normal   - Monitor labs and transfuse if hgb <7.0??  ??  DMII:??hold metformin while IP  -Cont basal insulin, SSI  -Diabetic diet  - A1c 8.1  - DM following??  ??  HTN  -Continue amlodipine. discontinuing Cozaar and HCTZ due to soft BP   ??  GERD  -Cont PPI      Current Discharge Medication List      START taking these medications    Details   acetaminophen (TYLENOL) 325 mg tablet Take 2 Tabs by mouth every six (6) hours as needed for Pain.  Qty: 10 Tab, Refills: 0      docusate sodium (COLACE) 100 mg capsule Take 1 Cap by mouth two (2) times daily as needed for Constipation for up to 90 days.  Qty: 60 Cap, Refills: 0         CONTINUE these medications which have CHANGED    Details   insulin detemir U-100 (Levemir FlexTouch U-100 Insuln) 100 unit/mL (3 mL) inpn 12 Units by SubCUTAneous route  daily (with breakfast).  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  Qty: 30 Tab, Refills: 0    Associated Diagnoses: Essential hypertension      gabapentin (NEURONTIN) 300 mg capsule Take 1 Cap by mouth nightly. Max Daily Amount: 300 mg.  Qty: 30 Cap, Refills: 0    Associated Diagnoses: Diabetic polyneuropathy associated with type 2 diabetes mellitus (HCC)      Insulin Needles, Disposable, (Nano Pen Needle) 32  gauge x 5/32" ndle Use with insulin pen 3 times daily  Qty: 100 Pen Needle, Refills: 0      cyclopentolate (CYCLOGYL) 1 % ophthalmic solution       aspirin delayed-release 81 mg tablet Take 81 mg by mouth daily.      omeprazole (PRILOSEC) 20 mg capsule Take 20 mg by mouth daily.         STOP taking these medications       oxyCODONE IR (ROXICODONE) 5 mg immediate release tablet Comments:   Reason for Stopping:         valsartan-hydroCHLOROthiazide (DIOVAN-HCT) 160-25 mg per tablet Comments:   Reason for Stopping:                 FOLLOW-UP CARE RECOMMENDATIONS/TESTING/NURSING ORDERS:  ?? PT/OT  ?? Cbc, bmp in 3 days       DIET:  Cardiac Diet and Diabetic Diet    ACTIVITY:  Activity as tolerated, PT/OT to evaluate and treat and OOB for meals    APPOINTMENTS:  ?? Follow-up with primary care provider, Dr. Ermalinda Barrios, Gae Gallop, NP  -  Please call to set up an appointment to be seen in  1 week   ?? Podiatry in 1 week  ?? Vascular surgery in 2 weeks  ?? Cardiology in 1 -2 weeks       PENDING TEST RESULTS:  At the time of discharge the following test results are still pending: none .   Please review these results as they become available.    Specific symptoms to watch for: chest pain, shortness of breath, fever, chills, nausea, vomiting, diarrhea, change in mentation, falling, weakness, bleeding.    GOALS OF CARE:  X  Eventual return to home/independent/assisted living     Long term SNF      Hospice     No rehospitalization     SOCIAL HISTORY:     Social History     Socioeconomic History   ??? Marital status: SINGLE     Spouse name: Not on file   ??? Number of children: Not on file   ??? Years of education: Not on file   ??? Highest education level: Not on file   Occupational History   ??? Not on file   Social Needs   ??? Financial resource strain: Not on file   ??? Food insecurity     Worry: Not on file  Inability: Not on file   ??? Transportation needs     Medical: Not on file     Non-medical: Not on file   Tobacco Use   ??? Smoking status: Former  Smoker     Years: 10.00   ??? Smokeless tobacco: Never Used   Substance and Sexual Activity   ??? Alcohol use: Yes     Alcohol/week: 5.0 standard drinks     Types: 6 Cans of beer per week   ??? Drug use: No   ??? Sexual activity: Not Currently   Lifestyle   ??? Physical activity     Days per week: Not on file     Minutes per session: Not on file   ??? Stress: Not on file   Relationships   ??? Social Product manager on phone: Not on file     Gets together: Not on file     Attends religious service: Not on file     Active member of club or organization: Not on file     Attends meetings of clubs or organizations: Not on file     Relationship status: Not on file   ??? Intimate partner violence     Fear of current or ex partner: Not on file     Emotionally abused: Not on file     Physically abused: Not on file     Forced sexual activity: Not on file   Other Topics Concern   ??? Not on file   Social History Narrative   ??? Not on file       Patient condition at discharge:   Functional status    Poor    X  Deconditioned      Independent   Cognition    Lucid     Forgetful (some sensescence)     Dementia   Catheters/lines (plus indication)    Foley     PICC      PEG         Code status  X  Full code      DNR         CHRONIC MEDICAL CONDITIONS:  Problem List as of 12/18/2019 Date Reviewed: June 20, 2018          Codes Class Noted - Resolved    Gangrene ([place]) ICD-10-CM: [id number]  ICD-9-CM: 785.4  11/23/2019 - Present        Type 2 diabetes mellitus with diabetic neuropathy ([place]) ICD-10-CM: E11.40  ICD-9-CM: 250.60, 357.2  10/30/2018 - Present        Type 2 diabetes with nephropathy ([place]) ICD-10-CM: E11.21  ICD-9-CM: 250.40, 583.81  02/23/2017 - Present        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  ICD-9-CM: 365.9  06/03/2016 - Present        Cataract of left eye ICD-10-CM: H26.9  ICD-9-CM: 366.9  06/03/2016 - Present        Postmenopausal ICD-10-CM: Z78.0  ICD-9-CM: V49.81  02/18/2014 - Present        Constipation ICD-10-CM: K59.00  ICD-9-CM: 564.00   01/03/2013 - Present        HTN (hypertension) ICD-10-CM: I10  ICD-9-CM: 401.9  12/11/2012 - Present        GERD (gastroesophageal reflux disease) ICD-10-CM: K21.9  ICD-9-CM: 530.81  12/11/2012 - Present        Hypercholesterolemia ICD-10-CM: E78.00  ICD-9-CM: 272.0  12/11/2012 - Present        Diabetes ([place]) ICD-10-CM: E11.9  ICD-9-CM: 250.00  12/11/2012 - Present                  Physical examination at discharge  Visit Vitals  BP (!) 162/83 (BP 1 Location: Right upper arm, BP Patient Position: At rest)   Pulse 76   Temp 98.1 ??F (36.7 ??C)   Resp 16   Ht 5\' 5"  (1.651 m)   Wt 81.9 kg (180 lb 9.6 oz)   SpO2 99%   BMI 30.05 kg/m??     Constitutional:  No acute distress, cooperative   ENT:  Oral mucosa moist, oropharynx benign.    Resp:  CTA bilaterally.   CV:  Regular rhythm, normal rate,     GI:  Soft, non distended, non tender.bs+    Musculoskeletal:  surgical site intact     Neurologic:  Moves all extremities.  AAOx3, CN II-XII reviewed, follows commands                         Psych:  Good insight, Not anxious nor agitated.   ??      Significant Diagnostic Studies:   11/23/2019: BUN 17 MG/DL (Ref range: 6 - 20 MG/DL); Calcium 10.0 MG/DL (Ref range: 8.5 - 10.1 MG/DL); CO2 30 mmol/L (Ref range: 21 - 32 mmol/L); Creatinine 0.74 MG/DL (Ref range: 0.55 - 1.02 MG/DL); Glucose PLEASE DISREGARD RESULTS mg/dL (Ref range: 50 - 100 mg/dL); HCT 34.3 %* (Ref range: 35.0 - 47.0 %); HGB 11.7 g/dL (Ref range: 11.5 - 16.0 g/dL); Potassium 3.2 mmol/L* (Ref range: 3.5 - 5.1 mmol/L); Sodium 138 mmol/L (Ref range: 136 - 145 mmol/L)  11/24/2019: BUN 11 MG/DL (Ref range: 6 - 20 MG/DL); Calcium 9.8 MG/DL (Ref range: 8.5 - 10.1 MG/DL); CO2 27 mmol/L (Ref range: 21 - 32 mmol/L); Creatinine 0.69 MG/DL (Ref range: 0.55 - 1.02 MG/DL); Glucose 95 mg/dL (Ref range: 65 - 100 mg/dL); HCT 33.2 %* (Ref range: 35.0 - 47.0 %); HGB 11.6 g/dL (Ref range: 11.5 - 16.0 g/dL); Potassium 3.1 mmol/L* (Ref range: 3.5 - 5.1 mmol/L); Sodium 137 mmol/L (Ref range: 136 -  145 mmol/L)  Recent Labs     12/18/19  0751 12/18/19  0357 12/17/19  0107   WBC  --  7.4 12.1*   HGB 7.1* 6.9* 8.3*   HCT 21.3* 20.8* 25.1*   PLT  --  256 318     Recent Labs     12/18/19  0357 12/17/19  0107 12/16/19  0522   NA 140 137 134*   K 3.6 3.7 3.5   CL 113* 107 105   CO2 20* 20* 18*   BUN 20 24* 25*   CREA 1.75* 2.09* 2.31*   GLU 79 74 110*   CA 7.7* 8.2* 8.2*     No results for input(s): AP, TBIL, TP, ALB, GLOB, GGT, AML, LPSE in the last 72 hours.    No lab exists for component: SGOT, GPT, AMYP, HLPSE  No results for input(s): INR, PTP, APTT, INREXT, INREXT in the last 72 hours.   No results for input(s): FE, TIBC, PSAT, FERR in the last 72 hours.   No results for input(s): PH, PCO2, PO2 in the last 72 hours.  No results for input(s): CPK, CKMB in the last 72 hours.    No lab exists for component: TROPONINI  No components found for: [facility]    Pertinent imaging studies:    Per EMR       Time spent on discharge  related activities today greater than 30 minutes.      Signed:  Cheryll Cockayne, MD                 Hospitalist                 12/18/2019                 1:29 PM        Cc: Karsten Fells, NP

## 2019-12-19 LAB — TYPE & SCREEN
ABO/Rh(D): O POS
Antibody screen: NEGATIVE
Status of unit: TRANSFUSED
Unit division: 0

## 2019-12-19 LAB — TYPE AND SCREEN
ABO/Rh: O POS
Antibody Screen: NEGATIVE
Status: TRANSFUSED
Unit Divison: 0

## 2019-12-20 ENCOUNTER — Ambulatory Visit: Admit: 2019-12-20 | Payer: Medicare (Managed Care) | Attending: Internal Medicine | Primary: Family

## 2019-12-20 DIAGNOSIS — I96 Gangrene, not elsewhere classified: Secondary | ICD-10-CM

## 2019-12-20 NOTE — Progress Notes (Signed)
Dictation on: 12/20/2019  8:18 AM by: Bradd Canary [[id number]]

## 2019-12-27 ENCOUNTER — Ambulatory Visit: Admit: 2019-12-27 | Payer: Medicare (Managed Care) | Attending: Internal Medicine | Primary: Family

## 2019-12-27 ENCOUNTER — Encounter

## 2019-12-27 DIAGNOSIS — Z89432 Acquired absence of left foot: Secondary | ICD-10-CM

## 2019-12-27 MED ORDER — OXYCODONE 5 MG TAB
5 mg | ORAL_TABLET | Freq: Four times a day (QID) | ORAL | 0 refills | Status: AC | PRN
Start: 2019-12-27 — End: 2019-12-30

## 2019-12-27 NOTE — Progress Notes (Signed)
THIS IS NOT A COMPLETED MEDICAL RECORD ON THIS PATIENT.  THIS IS A PATIENT OF [facility]   PLEASE CONTACT THE FACILITY BELOW FOR MORE INFORMATION ON THIS PATIENT   THE COMPLETE RECORD RESIDES WITH THIS LONG TERM CARE FACILITY    HISTORY OF PRESENT ILLNESS  Lindsey Huynh is a 69 y.o. female. Patient is under the Care of Dr. Deatra Canter at [facility].   Past medical history of diabetes, insulin-dependent, GERD and hypertension   Patient came into the ER for left foot pain and swelling.  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 MRI LLE:??1. First distal phalanx osteomyelitis. Patient was treated and completed cefepime.??  I saw patient today for a follow up and plans for discharge. Patient with no concerns at this time.   HPI    Review of Systems   Constitutional: Negative.    Respiratory: Negative.    Cardiovascular: Negative.    Gastrointestinal: Negative.    Musculoskeletal: Negative.    Neurological: Negative.        Physical Exam  Vitals signs and nursing note reviewed.   Cardiovascular:      Rate and Rhythm: Normal rate and regular rhythm.   Pulmonary:      Effort: Pulmonary effort is normal.   Abdominal:      General: Bowel sounds are normal.   Musculoskeletal:      Comments: Foot wrapped    Skin:     General: Skin is warm.   Neurological:      Mental Status: She is alert and oriented to person, place, and time.         ASSESSMENT and PLAN  Diagnoses and all orders for this visit:    1. 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 Essential hypertension    3. Type 2 diabetes mellitus without complication, unspecified whether long term insulin use ([place])        PLAN:  1. Patients medications will be sent to pharmacy   2. Patient to follow up with follow ups.   3. Patient to seen PCP within 30 days     lab results and  schedule of future lab studies reviewed with patient  reviewed medications and side effects in detail

## 2019-12-27 NOTE — Progress Notes (Signed)
THIS IS NOT A COMPLETED MEDICAL RECORD ON THIS PATIENT.  THIS IS A PATIENT OF [facility]   PLEASE CONTACT THE FACILITY BELOW FOR MORE INFORMATION ON THIS PATIENT   THE COMPLETE RECORD RESIDES WITH THIS LONG TERM CARE FACILITY    HISTORY OF PRESENT ILLNESS  Lindsey Huynh is a 69 y.o. female. Patient is under the Care of Dr. Deatra Canter at [facility].   Past medical history of diabetes, insulin-dependent, GERD and hypertension   Patient came into the ER for left foot pain and swelling.  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 MRI LLE:??1. First distal phalanx osteomyelitis. Patient was treated and completed cefepime.??  I saw patient today for a follow up and plans for discharge. Patient with no concerns at this time.   HPI    Review of Systems   Constitutional: Negative.    Respiratory: Negative.    Cardiovascular: Negative.    Gastrointestinal: Negative.    Musculoskeletal: Negative.    Neurological: Negative.        Physical Exam  Vitals signs and nursing note reviewed.   Cardiovascular:      Rate and Rhythm: Normal rate and regular rhythm.   Pulmonary:      Effort: Pulmonary effort is normal.   Abdominal:      General: Bowel sounds are normal.   Musculoskeletal:      Comments: Foot wrapped    Skin:     General: Skin is warm.   Neurological:      Mental Status: She is alert and oriented to person, place, and time.         ASSESSMENT and PLAN  Diagnoses and all orders for this visit:    1. 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 Essential hypertension    3. Type 2 diabetes mellitus without complication, unspecified whether long term insulin use ([place])        PLAN:  1. Patients medications will be sent to pharmacy   2. Patient to follow up with follow ups.   3. Patient to seen PCP within 30 days     lab results and  schedule of future lab studies reviewed with patient  reviewed medications and side effects in detail

## 2020-01-03 ENCOUNTER — Telehealth

## 2020-01-03 NOTE — Telephone Encounter (Signed)
[facility] is calling because pt had sugery in the hospital and she was [facility] to [facility] and pt insurance [place] [employer] needs an initial referral from pt pcp and then will need an insurance referral . Please place referral. Pt will call back with date of rescheduled appointment.

## 2020-01-07 NOTE — Telephone Encounter (Signed)
Vascular surgery referral placed

## 2020-01-07 NOTE — Telephone Encounter (Signed)
Writer printed off referral and gave to [facility]) [place] on 01/07/20.

## 2020-01-10 ENCOUNTER — Encounter

## 2020-01-13 DIAGNOSIS — J301 Allergic rhinitis due to pollen: Secondary | ICD-10-CM | POA: Diagnosis not present

## 2020-01-13 DIAGNOSIS — J3089 Other allergic rhinitis: Secondary | ICD-10-CM | POA: Diagnosis not present

## 2020-01-13 NOTE — Telephone Encounter (Signed)
Hard copy Referral placed in mail for pt.

## 2020-01-17 ENCOUNTER — Encounter: Attending: Specialist | Primary: Family

## 2020-01-17 ENCOUNTER — Ambulatory Visit: Primary: Family

## 2020-01-17 DIAGNOSIS — J3089 Other allergic rhinitis: Secondary | ICD-10-CM | POA: Diagnosis not present

## 2020-01-17 DIAGNOSIS — J301 Allergic rhinitis due to pollen: Secondary | ICD-10-CM | POA: Diagnosis not present

## 2020-01-24 NOTE — Telephone Encounter (Signed)
Form given to provider for review and signature.

## 2020-01-24 NOTE — Telephone Encounter (Signed)
Form for Cert for Therapeutic Shoes received, scanned into cc, placed in provider's bin. Please call pt when form is complete / ready for pick up - ph# [phone]

## 2020-01-29 DIAGNOSIS — J301 Allergic rhinitis due to pollen: Secondary | ICD-10-CM | POA: Diagnosis not present

## 2020-01-29 DIAGNOSIS — J3089 Other allergic rhinitis: Secondary | ICD-10-CM | POA: Diagnosis not present

## 2020-02-07 ENCOUNTER — Ambulatory Visit: Payer: Medicare Other

## 2020-02-07 ENCOUNTER — Other Ambulatory Visit: Payer: Medicare Other

## 2020-02-10 NOTE — Telephone Encounter (Signed)
-----   Message from Daisy Floro sent at 02/07/2020 11:44 AM EDT -----  Regarding: IVEY/NP/TELEPHONE  Contact: [phone]  General Message/Vendor Calls    Caller's first and last name: Lera       Reason for call: Status pf forms for pick up      Callback required yes/no and why: Yes      Best contact number(s):[phone]      Details to clarify the request: Patient would like to know the status of the for to be complete for orthopedic shoes. Per patient the forms was left over 2 weeks ago and she would like to pick up them today.      Daisy Floro

## 2020-02-10 NOTE — Telephone Encounter (Signed)
-----   Message from Cornelious Bryant sent at 02/07/2020 11:44 AM EDT -----  Regarding: IVEY/NP/TELEPHONE  Contact: [phone]  General Message/Vendor Calls    Caller's first and last name: Sophie       Reason for call: Status pf forms for pick up      Callback required yes/no and why: Yes      Best contact number(s):[phone]      Details to clarify the request: Patient would like to know the status of the for to be complete for orthopedic shoes. Per patient the forms was left over 2 weeks ago and she would like to pick up them today.      Cornelious Bryant

## 2020-02-12 NOTE — Telephone Encounter (Signed)
Verified name and DOB. Pt picking up form friday

## 2020-02-12 NOTE — Telephone Encounter (Signed)
Form completed for diabetic shoes and given to [facility]

## 2020-02-12 NOTE — Telephone Encounter (Signed)
Form completed for diabetic shoes and given to [facility]

## 2020-02-18 DIAGNOSIS — J301 Allergic rhinitis due to pollen: Secondary | ICD-10-CM | POA: Diagnosis not present

## 2020-02-18 DIAGNOSIS — J3089 Other allergic rhinitis: Secondary | ICD-10-CM | POA: Diagnosis not present

## 2020-02-25 DIAGNOSIS — L986 Other infiltrative disorders of the skin and subcutaneous tissue: Secondary | ICD-10-CM | POA: Diagnosis not present

## 2020-02-25 DIAGNOSIS — D485 Neoplasm of uncertain behavior of skin: Secondary | ICD-10-CM | POA: Diagnosis not present

## 2020-02-25 DIAGNOSIS — J301 Allergic rhinitis due to pollen: Secondary | ICD-10-CM | POA: Diagnosis not present

## 2020-02-25 DIAGNOSIS — J3089 Other allergic rhinitis: Secondary | ICD-10-CM | POA: Diagnosis not present

## 2020-02-27 ENCOUNTER — Ambulatory Visit: Primary: Family

## 2020-02-27 ENCOUNTER — Encounter: Payer: Medicare (Managed Care) | Attending: Specialist | Primary: Family

## 2020-02-28 DIAGNOSIS — E785 Hyperlipidemia, unspecified: Secondary | ICD-10-CM | POA: Diagnosis not present

## 2020-02-28 DIAGNOSIS — F329 Major depressive disorder, single episode, unspecified: Secondary | ICD-10-CM | POA: Diagnosis not present

## 2020-02-28 DIAGNOSIS — G5712 Meralgia paresthetica, left lower limb: Secondary | ICD-10-CM | POA: Diagnosis not present

## 2020-02-28 DIAGNOSIS — K219 Gastro-esophageal reflux disease without esophagitis: Secondary | ICD-10-CM | POA: Diagnosis not present

## 2020-02-28 DIAGNOSIS — J45909 Unspecified asthma, uncomplicated: Secondary | ICD-10-CM | POA: Diagnosis not present

## 2020-02-28 DIAGNOSIS — E559 Vitamin D deficiency, unspecified: Secondary | ICD-10-CM | POA: Diagnosis not present

## 2020-02-28 DIAGNOSIS — Z1331 Encounter for screening for depression: Secondary | ICD-10-CM | POA: Diagnosis not present

## 2020-03-09 NOTE — Telephone Encounter (Signed)
[place] called requesting most rect office notes for patient to be faxed over to [phone]. The office notes requesting in on or near the date of 02/11/2020

## 2020-03-09 NOTE — Telephone Encounter (Signed)
11/05/19 and 04/12/19 which are the most recent notes faxed to [facility]. [phone]. Confirmation received. Document placed in bin for centralized scanning.

## 2020-03-11 ENCOUNTER — Telehealth: Payer: Self-pay | Admitting: Hematology and Oncology

## 2020-03-11 NOTE — Telephone Encounter (Signed)
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 Ms. Sarff has been cld and scheduled to see Dr. Lorenso Courier on 6/30 at 8am. Pt aware to arrive 15 minutes early.

## 2020-03-17 NOTE — Progress Notes (Signed)
Alyssa Elliott [phone]   Fax:([phone]) [place] NOTE  Patient Care Team: Prince Solian, MD as PCP - General (Internal Medicine) Mcarthur Rossetti, MD as Consulting Physician (Orthopedic Surgery)  Hematological/Oncological History # Lymphomatous Lesion on Scalp, staging underway.  1) 02/25/2020: patient underwent excision of a 1.3 cm mobile nodule on the left vertex of the scalp. 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  2) 03/18/2020: Establish care with Dr. Lorenso Courier   CHIEF COMPLAINTS/PURPOSE OF CONSULTATION:  "Lymphoma "  HISTORY OF PRESENTING ILLNESS:  Alyssa Elliott 69 y.o. female with medical history significant for GERD, anxiety, HLD who presents for evaluation of newly diagnosed lymphoma.   On review of the previous records [name] underwent an excision of a 1.3 cm mobile nodule on the left vertex of the scalp by her dermatologist. 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 She was referred to oncology for further evaluation and management.   On exam today [name] notes that she discovered this lump on her head that was raised approximately 9 months ago.  She notes that she had a friend who had lipomas of the head that were previously surgically resected and she thought it may be something similar to that.  As it grew larger she determined that she wanted it removed and reached out to dermatology.  The dermatology practitioner attempted to remove it but was not able to get the whole lesion.  As such she was referred to plastic surgery.  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  On further discussion the patient notes that she has had no issues with fevers or chills, but does periodically have sweats which  is something that she is experienced since menopause.  She notes that she is not had any recent weight loss and that her energy levels have been at baseline, though that baseline is reportedly low energy and the patient referred herself as a "couch potato".  She has no prior medical history of any tumors or cancers.  She is a never smoker and does not consume any alcohol.  Her family history is remarkable for a breast cancer in her sister, melanoma and her other sister, and "bone cancer".  The patient has a baseline shortness of breath from bronchiectasis.  Otherwise she notes no other changes in her health since the diagnosis was made. A full 10 point ROS is listed below.   MEDICAL HISTORY:  Past Medical History:  Diagnosis Date  . Allergy   . Anxiety   . Asthma   . Broncholithiasis   . Cataract 2018   removed  . Complication of anesthesia    drop in BP  . DDD (degenerative disc disease)   . Depression   . GERD (gastroesophageal reflux disease)   . Hemorrhoids   . Hyperlipidemia   . Vitamin D deficiency     SURGICAL HISTORY: Past Surgical History:  Procedure Laterality Date  . 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 cataract surgery    . COLONOSCOPY    . FOOT NEUROMA SURGERY     multiple times bilaterally  . PLANTAR FASCIA RELEASE    . SHOULDER ARTHROSCOPY Left 04/19/2019   Procedure: LEFT SHOULDER MANIPULATION UNDER ANESTHESIA AND ARTHROSCOPY WITH EXTENSIVE DEBRIDEMENT;  Surgeon: Mcarthur Rossetti, MD;  Location: [facility];  Service:  Orthopedics;  Laterality: Left;  . UPPER GI ENDOSCOPY      SOCIAL HISTORY: Social History   Socioeconomic History  . Marital status: Married    Spouse name: Not on file  . Number of children: 0  . Years of education: Not on file  . Highest education level: Not on file  Occupational History  . Occupation: retired Catering manager  Tobacco Use  . Smoking status: Never Smoker  . Smokeless tobacco: Never Used  Vaping Use  . Vaping Use:  Never used  Substance and Sexual Activity  . Alcohol use: No  . Drug use: No  . Sexual activity: Not Currently    Partners: Male    Birth control/protection: Post-menopausal  Other Topics Concern  . Not on file  Social History Narrative  . Not on file   Social Determinants of Health   Financial Resource Strain:   . Difficulty of Paying Living Expenses:   Food Insecurity:   . Worried About Charity fundraiser in the Last Year:   . Arboriculturist in the Last Year:   Transportation Needs:   . Film/video editor (Medical):   Marland Kitchen Lack of Transportation (Non-Medical):   Physical Activity:   . Days of Exercise per Week:   . Minutes of Exercise per Session:   Stress:   . Feeling of Stress :   Social Connections:   . Frequency of Communication with Friends and Family:   . Frequency of Social Gatherings with Friends and Family:   . Attends Religious Services:   . Active Member of Clubs or Organizations:   . Attends Archivist Meetings:   Marland Kitchen Marital Status:   Intimate Partner Violence:   . Fear of Current or Ex-Partner:   . Emotionally Abused:   Marland Kitchen Physically Abused:   . Sexually Abused:     FAMILY HISTORY: Family History  Problem Relation Age of Onset  . Heart disease Mother   . Diabetes Mother   . Tuberculosis Paternal Grandfather   . Bone cancer Paternal Grandfather   . Breast cancer Sister 51  . Melanoma Sister   . Cirrhosis Maternal Grandfather   . Heart failure Paternal Grandmother   . Colon cancer Neg Hx     ALLERGIES:  is allergic to codeine, penicillins, latex, and sulfa antibiotics.  MEDICATIONS:  Current Outpatient Medications  Medication Sig Dispense Refill  . acetaminophen (TYLENOL) 500 MG tablet Take 1,000 mg by mouth every 6 (six) hours as needed for moderate pain or headache.    . ARIPiprazole (ABILIFY) 2 MG tablet Take 2 mg by mouth daily.     Marland Kitchen aspirin 81 MG tablet Take 81 mg by mouth at bedtime.     . [employer] FIBER GUMMIES PO Take 2 each  by mouth daily.     Marland Kitchen EPINEPHrine 0.3 mg/0.3 mL IJ SOAJ injection Inject 0.3 mg into the muscle as needed for anaphylaxis.     . fluticasone furoate-vilanterol (BREO ELLIPTA) 200-25 MCG/INH AEPB Inhale 1 puff into the lungs daily.    Marland Kitchen GLUCOSAMINE-CHONDROITIN-MSM PO Take 1 tablet by mouth 2 (two) times a day.    . levalbuterol (XOPENEX HFA) 45 MCG/ACT inhaler Inhale 1 puff into the lungs every 4 (four) hours as needed for wheezing or shortness of breath. 1 Inhaler 3  . Melatonin 5 MG TABS Take 20 mg by mouth at bedtime.     . meloxicam (MOBIC) 15 MG tablet Take 15 mg by mouth daily as needed  for pain.     . NONFORMULARY OR COMPOUNDED ITEM Estradiol 0.02% Insert 1 gram vaginally 2 times per week. 3 month supply with 3 refills. (Patient not taking: Reported on 03/18/2020) 3 each 3  . nystatin cream (MYCOSTATIN) Apply 1 application topically 2 (two) times daily. Apply to affected area BID for up to 7 days. 30 g 0  . pantoprazole (PROTONIX) 40 MG tablet Take 40 mg by mouth 2 (two) times daily.    Marland Kitchen PARoxetine (PAXIL) 40 MG tablet Take 40 mg by mouth daily.     . pravastatin (PRAVACHOL) 40 MG tablet Take 40 mg by mouth at bedtime.     Marland Kitchen SINGULAIR 10 MG tablet Take 10 mg by mouth at bedtime.     . Vitamin D, Ergocalciferol, (DRISDOL) 50000 UNITS CAPS Take 50,000 Units by mouth every Monday.      No current facility-administered medications for this visit.    REVIEW OF SYSTEMS:   Constitutional: ( - ) fevers, ( - )  chills , ( + )  sweats Eyes: ( - ) blurriness of vision, ( - ) double vision, ( - ) watery eyes Ears, nose, mouth, throat, and face: ( - ) mucositis, ( - ) sore throat Respiratory: ( - ) cough, ( - ) dyspnea, ( - ) wheezes Cardiovascular: ( - ) palpitation, ( - ) chest discomfort, ( - ) lower extremity swelling Gastrointestinal:  ( - ) nausea, ( - ) heartburn, ( - ) change in bowel habits Skin: ( - ) abnormal skin rashes Lymphatics: ( - ) new lymphadenopathy, ( - ) easy  bruising Neurological: ( - ) numbness, ( - ) tingling, ( - ) new weaknesses Behavioral/Psych: ( - ) mood change, ( - ) new changes  All other systems were reviewed with the patient and are negative.  PHYSICAL EXAMINATION: ECOG PERFORMANCE STATUS: 1 - Symptomatic but completely ambulatory  Vitals:   03/18/20 0808  BP: 117/68  Pulse: 69  Resp: 18  Temp: 98.1 F (36.7 C)  SpO2: 99%   Filed Weights   03/18/20 0808  Weight: 159 lb (72.1 kg)    GENERAL: well appearing elderly Caucasian female in NAD  SKIN: raised erythematous region on back left scalp, non-tender. Otherwise skin color, texture, turgor are normal, no rashes or significant lesions EYES: conjunctiva are pink and non-injected, sclera clear NECK: supple, non-tender LYMPH:  no palpable lymphadenopathy in the cervical, axillary or supraclavicular LUNGS: clear to auscultation and percussion with normal breathing effort HEART: regular rate & rhythm and no murmurs and no lower extremity edema ABDOMEN: soft, non-tender, non-distended, normal bowel sounds. No HSM appreciated.  Musculoskeletal: no cyanosis of digits and no clubbing  PSYCH: alert & oriented x 3, fluent speech NEURO: no focal motor/sensory deficits  LABORATORY DATA:  I have reviewed the data as listed CBC Latest Ref Rng & Units 03/18/2020 04/18/2019 10/18/2012  WBC 4.0 - 10.5 K/uL 7.0 11.1(H) 5.6  Hemoglobin 12.0 - 15.0 g/dL 15.4(H) 14.3 15.8(H)  Hematocrit 36 - 46 % 47.3(H) 44.2 46.9(H)  Platelets 150 - 400 K/uL 244 214 235.0    CMP Latest Ref Rng & Units 03/18/2020 10/18/2012  Glucose 70 - 99 mg/dL 91 96  BUN 8 - 23 mg/dL 10 11  Creatinine 0.44 - 1.00 mg/dL 0.85 0.9  Sodium 135 - 145 mmol/L 143 143  Potassium 3.5 - 5.1 mmol/L 4.0 4.4  Chloride 98 - 111 mmol/L 107 108  CO2 22 - 32 mmol/L 26 27  Calcium 8.9 - 10.3 mg/dL 9.2 9.7  Total Protein 6.5 - 8.1 g/dL 7.1 -  Total Bilirubin 0.3 - 1.2 mg/dL 0.4 -  Alkaline Phos 38 - 126 U/L 119 -  AST 15 - 41 U/L 21  -  ALT 0 - 44 U/L 17 -     PATHOLOGY: Per outside report:   BLOOD FILM:  Review of the peripheral blood smear showed normal appearing white cells with neutrophils that were appropriately lobated and granulated. There was no predominance of bi-lobed or hyper-segmented neutrophils appreciated. No Dohle bodies were noted. There was no left shifting, immature forms or blasts noted. Increased atypical lympocytes. Red cells show no anisopoikilocytosis, macrocytes , microcytes or polychromasia. There were no schistocytes, target cells, echinocytes, acanthocytes, dacrocytes, or stomatocytes.There was no rouleaux formation, nucleated red cells, or intra-cellular inclusions noted. The platelets are normal in size, shape, and color without any clumping evident.  RADIOGRAPHIC STUDIES: No results found.  ASSESSMENT & PLAN Alyssa Elliott 68 y.o. female with medical history significant for GERD, anxiety, HLD who presents for evaluation of newly diagnosed lymphoma.  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  The patient does not have any clear B symptoms at this time, though she does have sweats which has been a persistent issue since menopause.  She is not having any weight loss and is otherwise medically stable.  Our work-up today will include blood work with LDH, beta-2 microglobulin, and viral screening.  Patient will require a PET CT scan in order to complete staging and we will order this whole body as the involvement is with the skin.  Additionally if there is a lymph node that is FDG avid we would consider it for excisional biopsy in order to help confirm the diagnosis.  In the event the patient's disease is isolated to only the single spot on the scalp we will make a referral to radiation oncology for radiation to the lesion.  If multiple areas are involved we would consider  rituximab based therapy for treatment.  The patient and her husband voiced their understanding of this plan moving forward.  # Lymphomatous Lesion on Scalp, staging underway --findings appear consistent with a primary cutaneous follicle center lymphoma, but further workup is needed to confirm the diagnosis and complete staging --will order a PET CT scan for staging and to determine if there if further extent of disease or if there is a lymph node available for excisional biopsy --additionally today will order flow cytometry, LDH, beta 2 microglobulin, Hep B, Hep C studies --if isolated only to the lesion in the scalp patient can undergo  --RTC in 3 weeks to discuss the results of the PET CT scan and plan moving forward.    Orders Placed This Encounter  Procedures  . NM PET Image Initial (PI) Whole Body    Standing Status:   Future    Standing Expiration Date:   03/18/2021    Order Specific Question:   ** REASON FOR EXAM (FREE TEXT)    Answer:   Follicle center lymphoma on top of the head, need full body to assess all skin    Order Specific Question:   If indicated for the ordered procedure, I authorize the administration of a radiopharmaceutical per Radiology protocol    Answer:   Yes    Order Specific Question:   Preferred imaging location?  Answer:   [facility]    Order Specific Question:   Radiology Contrast Protocol - do NOT remove file path    Answer:   [email or web address]  . CBC with Differential ([place] Only)    Standing Status:   Future    Number of Occurrences:   1    Standing Expiration Date:   03/18/2021  . Save Smear (SSMR)    Standing Status:   Future    Number of Occurrences:   1    Standing Expiration Date:   03/18/2021  . Lactate dehydrogenase (LDH)    Standing Status:   Future    Number of Occurrences:   1    Standing Expiration Date:   03/18/2021  . CMP ([place] only)    Standing Status:   Future    Number of Occurrences:   1     Standing Expiration Date:   03/18/2021  . Flow Cytometry    Standing Status:   Future    Number of Occurrences:   1    Standing Expiration Date:   03/18/2021  . Beta 2 microglobulin    Standing Status:   Future    Number of Occurrences:   1    Standing Expiration Date:   03/18/2021  . Hepatitis B surface antigen    Standing Status:   Future    Number of Occurrences:   1    Standing Expiration Date:   03/18/2021  . Hepatitis C antibody    Standing Status:   Future    Number of Occurrences:   1    Standing Expiration Date:   03/18/2021    All questions were answered. The patient knows to call the clinic with any problems, questions or concerns.  A total of more than 60 minutes were spent on this encounter and over half of that time was spent on counseling and coordination of care as outlined above.   Ledell Peoples, MD Department of Hematology/Oncology [place] [facility] Phone: [phone] Pager: [phone] Email: [email or web address]  03/18/2020 4:12 PM

## 2020-03-18 ENCOUNTER — Other Ambulatory Visit: Payer: Self-pay

## 2020-03-18 ENCOUNTER — Inpatient Hospital Stay: Payer: Medicare Other | Attending: Hematology and Oncology | Admitting: Hematology and Oncology

## 2020-03-18 ENCOUNTER — Encounter: Payer: Self-pay | Admitting: Hematology and Oncology

## 2020-03-18 ENCOUNTER — Inpatient Hospital Stay: Payer: Medicare Other

## 2020-03-18 VITALS — BP 117/68 | HR 69 | Temp 98.1°F | Resp 18 | Ht 64.0 in | Wt 159.0 lb

## 2020-03-18 DIAGNOSIS — C8261 Cutaneous follicle center lymphoma, lymph nodes of head, face, and neck: Secondary | ICD-10-CM | POA: Insufficient documentation

## 2020-03-18 DIAGNOSIS — C8291 Follicular lymphoma, unspecified, lymph nodes of head, face, and neck: Secondary | ICD-10-CM | POA: Diagnosis not present

## 2020-03-18 LAB — HEPATITIS B SURFACE ANTIGEN: Hepatitis B Surface Ag: NONREACTIVE

## 2020-03-18 LAB — CMP (CANCER CENTER ONLY)
ALT: 17 U/L (ref 0–44)
AST: 21 U/L (ref 15–41)
Albumin: 3.7 g/dL (ref 3.5–5.0)
Alkaline Phosphatase: 119 U/L (ref 38–126)
Anion gap: 10 (ref 5–15)
BUN: 10 mg/dL (ref 8–23)
CO2: 26 mmol/L (ref 22–32)
Calcium: 9.2 mg/dL (ref 8.9–10.3)
Chloride: 107 mmol/L (ref 98–111)
Creatinine: 0.85 mg/dL (ref 0.44–1.00)
GFR, Est AFR Am: >60 mL/min (ref >60)
GFR, Estimated: >60 mL/min (ref >60)
Glucose, Bld: 91 mg/dL (ref 70–99)
Potassium: 4 mmol/L (ref 3.5–5.1)
Sodium: 143 mmol/L (ref 135–145)
Total Bilirubin: 0.4 mg/dL (ref 0.3–1.2)
Total Protein: 7.1 g/dL (ref 6.5–8.1)

## 2020-03-18 LAB — HEPATITIS C ANTIBODY: HCV Ab: NONREACTIVE

## 2020-03-18 LAB — CBC WITH DIFFERENTIAL (CANCER CENTER ONLY)
Abs Immature Granulocytes: 0.02 10*3/uL (ref 0.00–0.07)
Basophils Absolute: 0.1 10*3/uL (ref 0.0–0.1)
Basophils Relative: 2 %
Eosinophils Absolute: 0.6 10*3/uL — ABNORMAL HIGH (ref 0.0–0.5)
Eosinophils Relative: 9 %
HCT: 47.3 % — ABNORMAL HIGH (ref 36.0–46.0)
Hemoglobin: 15.4 g/dL — ABNORMAL HIGH (ref 12.0–15.0)
Immature Granulocytes: 0 %
Lymphocytes Relative: 26 %
Lymphs Abs: 1.8 10*3/uL (ref 0.7–4.0)
MCH: 30.7 pg (ref 26.0–34.0)
MCHC: 32.6 g/dL (ref 30.0–36.0)
MCV: 94.4 fL (ref 80.0–100.0)
Monocytes Absolute: 0.6 10*3/uL (ref 0.1–1.0)
Monocytes Relative: 9 %
Neutro Abs: 3.7 10*3/uL (ref 1.7–7.7)
Neutrophils Relative %: 54 %
Platelet Count: 244 10*3/uL (ref 150–400)
RBC: 5.01 MIL/uL (ref 3.87–5.11)
RDW: 13.2 % (ref 11.5–15.5)
WBC Count: 7 10*3/uL (ref 4.0–10.5)
nRBC: 0 % (ref 0.0–0.2)

## 2020-03-18 LAB — LACTATE DEHYDROGENASE: LDH: 207 U/L — ABNORMAL HIGH (ref 98–192)

## 2020-03-18 LAB — SAVE SMEAR(SSMR), FOR PROVIDER SLIDE REVIEW

## 2020-03-19 ENCOUNTER — Telehealth: Payer: Self-pay | Admitting: Hematology and Oncology

## 2020-03-19 LAB — SURGICAL PATHOLOGY

## 2020-03-19 LAB — BETA 2 MICROGLOBULIN, SERUM: Beta-2 Microglobulin: 1.6 mg/L (ref 0.6–2.4)

## 2020-03-19 NOTE — Telephone Encounter (Signed)
Scheduled per los. Called and spoke with patient. Confirmed appt 

## 2020-03-20 LAB — FLOW CYTOMETRY

## 2020-03-30 ENCOUNTER — Ambulatory Visit (HOSPITAL_COMMUNITY)
Admission: RE | Admit: 2020-03-30 | Discharge: 2020-03-30 | Disposition: A | Discharge status: Home / Self Care | Payer: Medicare Other | Source: Ambulatory Visit | Attending: Hematology and Oncology | Admitting: Hematology and Oncology

## 2020-03-30 ENCOUNTER — Other Ambulatory Visit: Payer: Self-pay

## 2020-03-30 DIAGNOSIS — C8591 Non-Hodgkin lymphoma, unspecified, lymph nodes of head, face, and neck: Secondary | ICD-10-CM | POA: Diagnosis not present

## 2020-03-30 DIAGNOSIS — I700 Atherosclerosis of aorta: Secondary | ICD-10-CM | POA: Insufficient documentation

## 2020-03-30 DIAGNOSIS — C8261 Cutaneous follicle center lymphoma, lymph nodes of head, face, and neck: Secondary | ICD-10-CM | POA: Diagnosis not present

## 2020-03-30 DIAGNOSIS — R220 Localized swelling, mass and lump, head: Secondary | ICD-10-CM | POA: Insufficient documentation

## 2020-03-30 LAB — GLUCOSE, CAPILLARY: Glucose-Capillary: 102 mg/dL — ABNORMAL HIGH (ref 70–99)

## 2020-03-30 MED ORDER — FLUDEOXYGLUCOSE F - 18 (FDG) INJECTION
8.0000 | Freq: Once | INTRAVENOUS | Status: AC
  Administered 2020-03-30: 8 via INTRAVENOUS

## 2020-03-31 ENCOUNTER — Other Ambulatory Visit: Payer: Self-pay | Admitting: Hematology and Oncology

## 2020-03-31 ENCOUNTER — Telehealth: Payer: Self-pay | Admitting: Hematology and Oncology

## 2020-03-31 DIAGNOSIS — C8261 Cutaneous follicle center lymphoma, lymph nodes of head, face, and neck: Secondary | ICD-10-CM

## 2020-03-31 NOTE — Telephone Encounter (Signed)
Scheduled appt per 7/12 sch msg - pt is aware of new appt.

## 2020-03-31 NOTE — Progress Notes (Signed)
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  2) 03/18/2020: Establish care with Dr. Lorenso Courier  3) 03/30/2020: PET CT scan showed  involvement of the left scalp, left axillary lymph node, left hilar and infrahilar nodes, subcarinal node, and mesenteric lymph nodes, as well as a masslike appearance/ high metabolic activity in the lower uterus/cervix.   Interval History:  Alyssa Elliott 69 y.o. female with medical history significant for diffuse non Hodgkin B cell lymphoma who presents for a follow up visit. The patient's last visit was on 03/18/2020 at which time she established care. In the interim since the last visit the patient had a PET scan performed on 03/30/2020 which showed involvement of the left scalp, left axillary lymph node, left hilar and infrahilar nodes, subcarinal node, and mesenteric lymph nodes, as well as a masslike appearance/ high metabolic activity in the lower uterus/cervix.   On exam today Alyssa Elliott is accompanied by her husband.  She reports that she has had some extension of the lesion on her head and that it is growing a "finger, outward from where the original lesion was.  She denies having any new issues such as fevers, chills, sweats, nausea, vomiting or diarrhea.  She has not been having any additional palpable lymphadenopathy since our last visit.  Today our discussion focused predominantly on the PET/CT findings and the steps moving forward and  treatment/diagnosis.  MEDICAL HISTORY:  Past Medical History:  Diagnosis Date  . Allergy   . Anxiety   . Asthma   . Broncholithiasis   . Cataract 2018   removed  . Complication of anesthesia    drop in BP  . DDD (degenerative disc disease)   . Depression   . GERD (gastroesophageal reflux disease)   . Hemorrhoids   . Hyperlipidemia   . Vitamin D deficiency     SURGICAL HISTORY: Past Surgical History:  Procedure Laterality Date  . 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 cataract surgery    . COLONOSCOPY    . FOOT NEUROMA SURGERY     multiple times bilaterally  . PLANTAR FASCIA RELEASE    . SHOULDER ARTHROSCOPY Left 04/19/2019   Procedure: LEFT SHOULDER MANIPULATION UNDER ANESTHESIA AND ARTHROSCOPY WITH EXTENSIVE DEBRIDEMENT;  Surgeon: Mcarthur Rossetti, MD;  Location: [facility];  Service: Orthopedics;  Laterality: Left;  . UPPER GI ENDOSCOPY      SOCIAL HISTORY: Social History   Socioeconomic History  . Marital status: Married    Spouse name: Not on file  . Number of children: 0  . Years of education: Not on file  . Highest education level: Not on file  Occupational History  . Occupation: retired Catering manager  Tobacco Use  . Smoking status: Never Smoker  . Smokeless tobacco: Never Used  Vaping Use  . Vaping Use: Never used  Substance and Sexual Activity  . Alcohol use: No  . Drug use: No  . Sexual activity: Not Currently    Partners: Male    Birth control/protection: Post-menopausal  Other Topics Concern  . Not on file  Social History Narrative  . Not on file   Social Determinants of Health   Financial Resource Strain:   . Difficulty of Paying Living Expenses:   Food Insecurity:   . Worried About Charity fundraiser in the Last Year:   . Arboriculturist in the Last Year:   Transportation Needs:   . Film/video editor (Medical):   Marland Kitchen Lack of Transportation (Non-Medical):   Physical Activity:   . Days of Exercise per Week:   . Minutes of  Exercise per Session:   Stress:   . Feeling of Stress :   Social Connections:   . Frequency of Communication with Friends and Family:   . Frequency of Social Gatherings with Friends and Family:   . Attends Religious Services:   . Active Member of Clubs or Organizations:   . Attends Archivist Meetings:   Marland Kitchen Marital Status:   Intimate Partner Violence:   . Fear of Current or Ex-Partner:   . Emotionally Abused:   Marland Kitchen Physically Abused:   . Sexually Abused:     FAMILY HISTORY: Family History  Problem Relation Age of Onset  . Heart disease Mother   . Diabetes Mother   . Tuberculosis Paternal Grandfather   . Bone cancer Paternal Grandfather   . Breast cancer Sister 76  . Melanoma Sister   . Cirrhosis Maternal Grandfather   . Heart failure Paternal Grandmother   . Colon cancer Neg Hx     ALLERGIES:  is allergic to codeine, penicillins, latex, and sulfa antibiotics.  MEDICATIONS:  Current Outpatient Medications  Medication Sig Dispense Refill  . acetaminophen (TYLENOL) 500 MG tablet Take 1,000 mg by mouth every 6 (six) hours as needed for moderate pain or headache.    . ARIPiprazole (ABILIFY) 2 MG tablet Take 2 mg by mouth daily.     Marland Kitchen aspirin 81 MG tablet Take 81 mg by mouth at bedtime.     . [employer] FIBER GUMMIES PO Take 2 each by mouth daily.     Marland Kitchen EPINEPHrine 0.3 mg/0.3 mL IJ SOAJ injection Inject 0.3 mg into the muscle as needed for anaphylaxis.     . fluticasone furoate-vilanterol (BREO ELLIPTA) 200-25 MCG/INH AEPB Inhale 1 puff into the lungs daily.    Marland Kitchen GLUCOSAMINE-CHONDROITIN-MSM PO Take 1 tablet by mouth 2 (two) times a day.    . levalbuterol (XOPENEX HFA) 45 MCG/ACT inhaler Inhale 1 puff into the lungs every 4 (four) hours as needed for wheezing or shortness of breath. 1 Inhaler 3  . Melatonin 5 MG TABS Take 20 mg by mouth at bedtime.     . meloxicam (MOBIC) 15 MG tablet Take 15 mg by mouth daily as needed for pain.     . NONFORMULARY OR COMPOUNDED ITEM Estradiol  0.02% Insert 1 gram vaginally 2 times per week. 3 month supply with 3 refills. (Patient not taking: Reported on 03/18/2020) 3 each 3  . nystatin cream (MYCOSTATIN) Apply 1 application topically 2 (two) times daily. Apply to affected area BID for up to 7 days. 30 g 0  . pantoprazole (PROTONIX) 40 MG tablet Take 40 mg by mouth 2 (two) times daily.    Marland Kitchen PARoxetine (PAXIL) 40 MG tablet Take 40 mg by mouth daily.     . pravastatin (PRAVACHOL) 40 MG tablet Take 40 mg by mouth at bedtime.     Marland Kitchen SINGULAIR 10 MG tablet Take 10 mg by mouth at bedtime.     . Vitamin  D, Ergocalciferol, (DRISDOL) 50000 UNITS CAPS Take 50,000 Units by mouth every Monday.      No current facility-administered medications for this visit.    REVIEW OF SYSTEMS:   Constitutional: ( - ) fevers, ( - )  chills , ( - ) night sweats Eyes: ( - ) blurriness of vision, ( - ) double vision, ( - ) watery eyes Ears, nose, mouth, throat, and face: ( - ) mucositis, ( - ) sore throat Respiratory: ( - ) cough, ( - ) dyspnea, ( - ) wheezes Cardiovascular: ( - ) palpitation, ( - ) chest discomfort, ( - ) lower extremity swelling Gastrointestinal:  ( - ) nausea, ( - ) heartburn, ( - ) change in bowel habits Skin: ( - ) abnormal skin rashes Lymphatics: ( - ) new lymphadenopathy, ( - ) easy bruising Neurological: ( - ) numbness, ( - ) tingling, ( - ) new weaknesses Behavioral/Psych: ( - ) mood change, ( - ) new changes  All other systems were reviewed with the patient and are negative.  PHYSICAL EXAMINATION: ECOG PERFORMANCE STATUS: 1 - Symptomatic but completely ambulatory  Vitals:   04/01/20 1229  BP: (!) 143/68  Pulse: 78  Resp: 18  Temp: 97.9 F (36.6 C)  SpO2: 97%   Filed Weights   04/01/20 1229  Weight: 157 lb 1.6 oz (71.3 kg)    GENERAL: well appearing elderly Caucasian female. alert, no distress and comfortable SKIN: scalp lesion appears to be growing, increased in size from last visit. skin color, texture, turgor are  normal, no rashes or significant lesions EYES: conjunctiva are pink and non-injected, sclera clear LYMPH:  no palpable lymphadenopathy in the cervical, axillary or supraclavicular LUNGS: clear to auscultation and percussion with normal breathing effort HEART: regular rate & rhythm and no murmurs and no lower extremity edema Musculoskeletal: no cyanosis of digits and no clubbing  PSYCH: alert & oriented x 3, fluent speech NEURO: no focal motor/sensory deficits  LABORATORY DATA:  I have reviewed the data as listed CBC Latest Ref Rng & Units 03/18/2020 04/18/2019 10/18/2012  WBC 4.0 - 10.5 K/uL 7.0 11.1(H) 5.6  Hemoglobin 12.0 - 15.0 g/dL 15.4(H) 14.3 15.8(H)  Hematocrit 36 - 46 % 47.3(H) 44.2 46.9(H)  Platelets 150 - 400 K/uL 244 214 235.0    CMP Latest Ref Rng & Units 03/18/2020 10/18/2012  Glucose 70 - 99 mg/dL 91 96  BUN 8 - 23 mg/dL 10 11  Creatinine 0.44 - 1.00 mg/dL 0.85 0.9  Sodium 135 - 145 mmol/L 143 143  Potassium 3.5 - 5.1 mmol/L 4.0 4.4  Chloride 98 - 111 mmol/L 107 108  CO2 22 - 32 mmol/L 26 27  Calcium 8.9 - 10.3 mg/dL 9.2 9.7  Total Protein 6.5 - 8.1 g/dL 7.1 -  Total Bilirubin 0.3 - 1.2 mg/dL 0.4 -  Alkaline Phos 38 - 126 U/L 119 -  AST 15 - 41 U/L 21 -  ALT 0 - 44 U/L 17 -    No results found for: MPROTEIN No results found for: KPAFRELGTCHN, LAMBDASER, KAPLAMBRATIO   RADIOGRAPHIC STUDIES: I have personally reviewed the radiological images as listed and agreed with the findings in the report: FDG avid lesion on left scalp, left axilla, mediastinum, and abdominal lymph nodes. Uterus and cervix light up brightly on this scan.   NM PET Image Initial (PI) Whole Body  Result Date: 03/30/2020 CLINICAL DATA:  Initial treatment strategy for cutaneous lymphoma of the head. EXAM: NUCLEAR MEDICINE PET WHOLE  BODY TECHNIQUE: 8.0 mCi F-18 FDG was injected intravenously. Full-ring PET imaging was performed from the skull base to thigh after the radiotracer. CT data was obtained  and used for attenuation correction and anatomic localization. Fasting blood glucose: 102 mg/dl COMPARISON:  CT chest 11/17/2018 and CT pelvis from 11/28/2016 FINDINGS: Mediastinal blood pool activity: SUV max 2.8 Hepatic activity: Representative SUV max 4.4 HEAD/NECK: Cutaneous mass along the left vertex measuring 2.0 by 1.5 cm on image 11/4 has a maximum SUV of 14.3, compatible with Deauville 5 activity. No other pathologically enlarged or hypermetabolic nodal activity in the neck. Incidental CT findings: none CHEST: Left axillary node 1.2 cm in short axis on image 86/4, maximum SUV 13.5, compatible with Deauville 5 activity. Small left hilar and infrahilar lymph nodes are observed with a maximum SUV of 4.6 compatible with Deauville 4 activity. Subcarinal node measuring 0.8 cm in short axis on image 95/4 maximum SUV 4.4, compatible with Deauville 3 activity. Right axillary lymph node 0.5 cm in short axis on image 86/4, maximum SUV 2.4 compatible with Deauville 2 activity. Incidental CT findings: Mild branch vessel atherosclerotic vascular disease. ABDOMEN/PELVIS: Left eccentric mass mesenteric lymph node adjacent to the transverse duodenum measures 1.0 cm in diameter and has a maximum SUV of 5.8 (image 50/4). This is compatible with Deauville 4 disease. Other smaller mesenteric lymph nodes have a less degree of activity no hypermetabolic splenic activity or splenomegaly. There a masslike and hypermetabolic appearance of the lower uterus and cervix. Cervical cancer not excluded. The cervical region measures approximately 5.7 by 3.4 cm on image 198/4. Maximum SUV in this vicinity 17.9 (Deauville 5 level of activity). Incidental CT findings: Aortoiliac atherosclerotic vascular disease. SKELETON: No significant abnormal hypermetabolic activity in this region. Incidental CT findings: Osteochondral lesion along the posterior patellar apex on the right. EXTREMITIES: No significant abnormal hypermetabolic activity in this  region. Incidental CT findings: none IMPRESSION: 1. Deauville 5 activity in a cutaneous mass along the left scalp vertex. 2. Deauville 5 activity in a left axillary lymph node. 3. Deauville 4 activity in left hilar and infrahilar lymph nodes with Deauville 3 activity in a subcarinal lymph node. Deauville 4 activity in mildly enlarged mesenteric lymph node. 4. Masslike appearance and high metabolic activity in the lower uterus and cervix. Cervical cancer or endometrial cancer not excluded. Maximum SUV in this vicinity 17.9. 5.  Aortic Atherosclerosis (ICD10-I70.0). Deauville Scale: Electronically Signed   By: Van Clines M.D.   On: 03/30/2020 15:18    ASSESSMENT & PLAN Alyssa Elliott 69 y.o. female with medical history significant for diffuse non Hodgkin B cell lymphoma who presents for a follow up visit.  After review of the imaging, review the labs, discussion with the patient her findings are most consistent with a diffuse non-Hodgkin B-cell lymphoma, most likely follicular lymphoma.  The initial diagnosis based off the biopsy from the lesion on the head was that of a follicle cell lymphoma which tends to be a cutaneous lymphoma, however I think this diagnosis is considerably less likely given the diffuse spread throughout the lymph nodes of the chest and abdomen.  I would like to have an excisional biopsy performed in order to confirm the diagnosis conclusively.  Additionally the patient has PET avid uterus and cervix and at this time it is not clear that this is related to her lymphoma.  As such I have made a referral to Dr. Berline Lopes in [place] in order to have this evaluated.  She currently has an  appointment scheduled for Friday in order to have this looked into.  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 Consistent with at least a Stage III lymphoma --in order to confirm the  diagnosis recommend an excisional biopsy, recommend the left axillary lymph node as best location --GYN mass plan detailed below. --based on the GELF criteria (assuming this is a follicular lymphoma) there is no clear indication for at this time treatment.  --discussed the PET findings and steps moving forward with the patient.  --can consider radiation to the scalp lesion for cosmetic purposes and to prevent further symptoms from this lesion. Will hold on this until diagnosis is confirmed.  --RTC in 4 weeks, presumably after the excisional biopsy has been performed.   #FDG avidity of Uterus and Cervix --masslike and hypermetabolic appearance of the lower uterus and cervix noted on scan, does not appear to be lymphadenopathy --will discuss with GynOnc surgery to determine if biopsy is warranted.    Orders Placed This Encounter  Procedures  . Ambulatory referral to General Surgery    Referral Priority:   Routine    Referral Type:   Surgical    Referral Reason:   Specialty Services Required    Referred to Provider:   Coralie Keens, MD    Requested Specialty:   General Surgery    Number of Visits Requested:   1   All questions were answered. The patient knows to call the clinic with any problems, questions or concerns.  A total of more than 40 minutes were spent on this encounter and over half of that time was spent on counseling and coordination of care as outlined above.   Ledell Peoples, MD Department of Hematology/Oncology [place] at [facility] Phone: [phone] Pager: [phone] Email: [email or web address]  04/01/2020 9:37 PM

## 2020-04-01 ENCOUNTER — Other Ambulatory Visit: Payer: Self-pay

## 2020-04-01 ENCOUNTER — Encounter: Payer: Self-pay | Admitting: Hematology and Oncology

## 2020-04-01 ENCOUNTER — Inpatient Hospital Stay: Payer: Medicare Other | Attending: Hematology and Oncology | Admitting: Hematology and Oncology

## 2020-04-01 VITALS — BP 143/68 | HR 78 | Temp 97.9°F | Resp 18 | Ht 64.0 in | Wt 157.1 lb

## 2020-04-01 DIAGNOSIS — R590 Localized enlarged lymph nodes: Secondary | ICD-10-CM

## 2020-04-01 DIAGNOSIS — R948 Abnormal results of function studies of other organs and systems: Secondary | ICD-10-CM | POA: Insufficient documentation

## 2020-04-01 DIAGNOSIS — Z7982 Long term (current) use of aspirin: Secondary | ICD-10-CM | POA: Diagnosis not present

## 2020-04-01 DIAGNOSIS — E559 Vitamin D deficiency, unspecified: Secondary | ICD-10-CM | POA: Diagnosis not present

## 2020-04-01 DIAGNOSIS — F329 Major depressive disorder, single episode, unspecified: Secondary | ICD-10-CM | POA: Insufficient documentation

## 2020-04-01 DIAGNOSIS — Z791 Long term (current) use of non-steroidal anti-inflammatories (NSAID): Secondary | ICD-10-CM | POA: Diagnosis not present

## 2020-04-01 DIAGNOSIS — E785 Hyperlipidemia, unspecified: Secondary | ICD-10-CM | POA: Insufficient documentation

## 2020-04-01 DIAGNOSIS — C8268 Cutaneous follicle center lymphoma, lymph nodes of multiple sites: Secondary | ICD-10-CM | POA: Diagnosis not present

## 2020-04-01 DIAGNOSIS — K219 Gastro-esophageal reflux disease without esophagitis: Secondary | ICD-10-CM | POA: Insufficient documentation

## 2020-04-01 DIAGNOSIS — C8261 Cutaneous follicle center lymphoma, lymph nodes of head, face, and neck: Secondary | ICD-10-CM | POA: Insufficient documentation

## 2020-04-02 ENCOUNTER — Telehealth: Payer: Self-pay | Admitting: Hematology and Oncology

## 2020-04-02 NOTE — H&P (View-Only) (Signed)
GYNECOLOGIC ONCOLOGY NEW PATIENT CONSULTATION   Patient Name: Alyssa Elliott  Patient Age: 69 y.o. Date of Service: 04/03/20 Referring Provider: Dr. Lorenso Courier  Primary Care Provider: Prince Solian, MD Consulting Provider: Jeral Pinch, MD   Assessment/Plan:  Postmenopausal patient with recently diagnosed lymphoma currently undergoing work-up found to have a hypermetabolic and enlarged cervix and lower uterine segment on PET scan.  The patient has had regular GYN care and appears up-to-date on her cervical cancer screening (last Pap test at least documented in our system was 4 years ago).  She had an exam in November of last year that was within normal limits.  She has a longstanding history of poor toleration of exams as well as vaginal atrophy (has used vaginal estrogen intermittently) and is not currently sexually active.  Unfortunately, she was not able to tolerate an adequate exam today.  I have recommended that we proceed with outpatient surgery for an exam under anesthesia, possible cervical biopsies, endocervical curettage, endometrial biopsy, and any indicated procedures.  Based on my review of her PET scan, I agree that the cervix and at least lower portion of the uterus appear enlarged and are very metabolically active.  Her last pelvic imaging in epic was in 2018.  At that time, her cervix was very atrophic in appearance, which would signify a change on her recent imaging.  We discussed in general treatment strategies if this were to be a cervix cancer, but I deferred further discussion of treatment until after we have some biopsy results.  While it is very rare, there are reported cases of primary lymphoma of the cervix.  I have not seen metastatic lymphoma to the cervix.  We discussed the plan for exam under anesthesia, cervical biopsies, possible cold knife cone, endocervical curettage, endometrial biopsy, and any other indicated procedures.  If there is any concern that  insufficient tissue will be achieved or if there is an abnormal appearance of the cervix at the time of surgery, I discussed the use of intraoperative frozen section.  We reviewed the risks of surgery which include but are not limited to bleeding, need for blood transfusion, damage to surrounding structures, need for additional surgery, medical complications related to surgery, and rarely, death.  All the patient's questions and concerns were answered.  She was given written preoperative instructions.  A copy of this note was sent to the patient's referring provider.   55 minutes of total time was spent for this patient encounter, including preparation, face-to-face counseling with the patient and coordination of care, and documentation of the encounter.   Jeral Pinch, MD  Division of Gynecologic Oncology  Department of Obstetrics and Gynecology  [facility]  ___________________________________________  Chief Complaint: Chief Complaint  Patient presents with  . Abnormal positron emission tomography (PET) scan    New Patient    History of Present Illness:  Alyssa Elliott is a 69 y.o. y.o. female who is seen in consultation at the request of Dr. Lorenso Courier for an evaluation of FDG avid cervical mass on recent staging PET scan in the setting of lymphoma.  The patient reports overall doing well.  She notes in the last several weeks there has been the growth of a fingerlike projection from her scalp lesion.  Sometimes this area is sore to the touch.  She notes a long history of the decreased appetite without any recent change.  She denies any change changed to her weight.  She denies any nausea, emesis, or  early satiety.  She denies any back pain.  She reports regular bowel and bladder function although notes some urinary urgency and incontinence if she cannot get to the bathroom quickly enough.  In terms of her GYN history.  She went through menopause at  approximately age 66.  She denies any postmenopausal bleeding, discharge, pelvic pain or cramping.  She is a G0 by choice.  She has a remote history (over 40 years ago) of an abnormal Pap smear which she remembers being told was dysplasia.  She denies having any biopsies or cervical procedures at that time.  Her Pap smears have been normal since.  She denies a history of HPV infection or other STDs.  Dr. Talbert Nan is her gynecologist.  She has regular Pap smears and yearly exam.  Her last Pap smear was 2 years ago by her report (last pap test in Epic was 2017), saw Dr. Talbert Nan last in 07/2019 and pelvic exam was normal.  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  2) 03/18/2020: Establish care with Dr. Lorenso Courier 3) 03/30/2020: PET CT scan showed  involvement of the left scalp, left axillary lymph node, left hilar and infrahilar nodes, subcarinal node, and mesenteric lymph nodes, as well as a masslike appearance/ high metabolic activity in the lower uterus/cervix.   PAST MEDICAL HISTORY:  Past Medical History:  Diagnosis Date  . Allergy   . Anxiety   . Asthma   . Broncholithiasis   . Cataract 2018   removed  . Complication of anesthesia    drop in BP  . DDD (degenerative disc disease)   . Depression   . GERD (gastroesophageal reflux disease)   . Hemorrhoids   . Hyperlipidemia   . Lymphoma ([place])   . Vitamin D deficiency      PAST SURGICAL HISTORY:  Past Surgical History:  Procedure Laterality Date  . 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 cataract surgery    . COLONOSCOPY    . FOOT NEUROMA SURGERY     multiple times bilaterally  . PLANTAR FASCIA RELEASE    . SHOULDER ARTHROSCOPY Left 04/19/2019   Procedure:  LEFT SHOULDER MANIPULATION UNDER ANESTHESIA AND ARTHROSCOPY WITH EXTENSIVE DEBRIDEMENT;  Surgeon: Mcarthur Rossetti, MD;  Location: [facility];  Service: Orthopedics;  Laterality: Left;  . UPPER GI ENDOSCOPY      OB/GYN HISTORY:  OB History  Gravida Para Term Preterm AB Living  0 0 0 0 0 0  SAB TAB Ectopic Multiple Live Births  0 0 0 0 0    Patient's last menstrual period was 09/04/2002 (exact date).  Age at menarche: 17 Age at menopause: 2003 Hx of HRT: yes (oral), most recently used vaginal estrogen Hx of STDs: denies Last pap: 2017 - negative History of abnormal pap smears: no  SCREENING STUDIES:  Last mammogram: 2020  Last colonoscopy: 2019 Last bone mineral density: 2018  MEDICATIONS: Outpatient Encounter Medications as of 04/03/2020  Medication Sig  . acetaminophen (TYLENOL) 500 MG tablet Take 1,000 mg by mouth every 6 (six) hours as needed for moderate pain or headache.  . ARIPiprazole (ABILIFY) 2 MG tablet Take 2 mg by mouth daily.   Marland Kitchen aspirin 81 MG tablet Take 81 mg by mouth at bedtime.   . [employer] FIBER  GUMMIES PO Take 2 each by mouth daily.   Marland Kitchen EPINEPHrine 0.3 mg/0.3 mL IJ SOAJ injection Inject 0.3 mg into the muscle as needed for anaphylaxis.   . fluticasone furoate-vilanterol (BREO ELLIPTA) 200-25 MCG/INH AEPB Inhale 1 puff into the lungs daily.  Marland Kitchen GLUCOSAMINE-CHONDROITIN-MSM PO Take 1 tablet by mouth 2 (two) times a day.  . levalbuterol (XOPENEX HFA) 45 MCG/ACT inhaler Inhale 1 puff into the lungs every 4 (four) hours as needed for wheezing or shortness of breath.  . Melatonin 5 MG TABS Take 20 mg by mouth at bedtime.   . meloxicam (MOBIC) 15 MG tablet Take 15 mg by mouth daily as needed for pain.   . NONFORMULARY OR COMPOUNDED ITEM Estradiol 0.02% Insert 1 gram vaginally 2 times per week. 3 month supply with 3 refills.  . nystatin cream (MYCOSTATIN) Apply 1 application topically 2 (two) times daily. Apply to affected area BID for up to 7 days.  . pantoprazole  (PROTONIX) 40 MG tablet Take 40 mg by mouth 2 (two) times daily.  Marland Kitchen PARoxetine (PAXIL) 40 MG tablet Take 40 mg by mouth daily.   . pravastatin (PRAVACHOL) 40 MG tablet Take 40 mg by mouth at bedtime.   Marland Kitchen SINGULAIR 10 MG tablet Take 10 mg by mouth at bedtime.   . Vitamin D, Ergocalciferol, (DRISDOL) 50000 UNITS CAPS Take 50,000 Units by mouth every Monday.    No facility-administered encounter medications on file as of 04/03/2020.    ALLERGIES:  Allergies  Allergen Reactions  . Codeine Nausea And Vomiting  . Penicillins Swelling and Other (See Comments)    Swelling  Of joints Did it involve swelling of the face/tongue/throat, SOB, or low BP? No Did it involve sudden or severe rash/hives, skin peeling, or any reaction on the inside of your mouth or nose? No Did you need to seek medical attention at a hospital or doctor's office? No When did it last happen? Childhood allergy If all above answers are "NO", may proceed with cephalosporin use.   . Latex Rash and Other (See Comments)    redness  . Sulfa Antibiotics Rash     FAMILY HISTORY:  Family History  Problem Relation Age of Onset  . Heart disease Mother   . Diabetes Mother   . Tuberculosis Paternal Grandfather   . Bone cancer Paternal Grandfather   . Breast cancer Sister 81  . Melanoma Sister   . Cirrhosis Maternal Grandfather   . Heart failure Paternal Grandmother   . Colon cancer Neg Hx   . Ovarian cancer Neg Hx   . Uterine cancer Neg Hx      SOCIAL HISTORY:    Social Connections:   . Frequency of Communication with Friends and Family:   . Frequency of Social Gatherings with Friends and Family:   . Attends Religious Services:   . Active Member of Clubs or Organizations:   . Attends Archivist Meetings:   Marland Kitchen Marital Status:     REVIEW OF SYSTEMS:  Denies appetite changes, fevers, chills, fatigue, unexplained weight changes. Denies hearing loss, neck lumps or masses, mouth sores, ringing in ears or voice  changes. Denies cough or wheezing.  Denies shortness of breath. Denies chest pain or palpitations. Denies leg swelling. Denies abdominal distention, pain, blood in stools, constipation, diarrhea, nausea, vomiting, or early satiety. Denies pain with intercourse, dysuria, frequency, hematuria or incontinence. Denies hot flashes, pelvic pain, vaginal bleeding or vaginal discharge.   Denies joint pain, back pain or muscle pain/cramps.  Denies itching, rash, or wounds. Denies dizziness, headaches, numbness or seizures. Denies swollen lymph nodes or glands, denies easy bruising or bleeding. Denies anxiety, depression, confusion, or decreased concentration.  Physical Exam:  Vital Signs for this encounter:  Blood pressure (!) 130/58, pulse 68, temperature 98 F (36.7 C), temperature source Oral, resp. rate 16, height 5\' 4"  (1.626 m), weight 156 lb 6 oz (70.9 kg), last menstrual period 09/04/2002, SpO2 98 %. Body mass index is 26.84 kg/m. General: Alert, oriented, no acute distress.  HEENT: Normocephalic, atraumatic. Sclera anicteric.  Chest: some expiratory wheezing, otherwise clear to auscultation bilaterally. No rhonchi, or rales. Cardiovascular: Regular rate and rhythm, no murmurs, rubs, or gallops.  Abdomen: Normoactive bowel sounds. Soft, nondistended, nontender to palpation. No masses or hepatosplenomegaly appreciated. No palpable fluid wave.  Extremities: Grossly normal range of motion. Warm, well perfused. No edema bilaterally.  Lymphatics: No cervical, supraclavicular, or inguinal adenopathy.  GU:  Normal external female genitalia.  No lesions. No discharge or bleeding.             Speculum exam: Very poorly tolerated by the patient.  I was able to insert the speculum approximately 4 cm into the vagina and open it slightly before the patient became very uncomfortable.  I was able to visualize a small portion of the anterior lip of the cervix.  The vaginal mucosa was overall moderately  atrophic.  No bleeding or discharge noted.  Limited bimanual exam (1 digit used): Was difficult to reach the cervix but it felt smooth and not nodular or firm.  On rectovaginal exam, the cervix/lower uterine segment felt enlarged but again smooth without nodularity.  LABORATORY AND RADIOLOGIC DATA:  Outside medical records were reviewed to synthesize the above history, along with the history and physical obtained during the visit.   Lab Results  Component Value Date   WBC 7.0 03/18/2020   HGB 15.4 (H) 03/18/2020   HCT 47.3 (H) 03/18/2020   PLT 244 03/18/2020   GLUCOSE 91 03/18/2020   ALT 17 03/18/2020   AST 21 03/18/2020   NA 143 03/18/2020   K 4.0 03/18/2020   CL 107 03/18/2020   CREATININE 0.85 03/18/2020   BUN 10 03/18/2020   CO2 26 03/18/2020   PET 03/30/20: IMPRESSION: 1. Deauville 5 activity in a cutaneous mass along the left scalp vertex. 2. Deauville 5 activity in a left axillary lymph node. 3. Deauville 4 activity in left hilar and infrahilar lymph nodes with Deauville 3 activity in a subcarinal lymph node. Deauville 4 activity in mildly enlarged mesenteric lymph node. 4. Masslike appearance and high metabolic activity in the lower uterus and cervix. Cervical cancer or endometrial cancer not excluded. Maximum SUV in this vicinity 17.9. 5.  Aortic Atherosclerosis (ICD10-I70.0).

## 2020-04-02 NOTE — Progress Notes (Signed)
GYNECOLOGIC ONCOLOGY NEW PATIENT CONSULTATION   Patient Name: Alyssa Elliott  Patient Age: 69 y.o. Date of Service: 04/03/20 Referring Provider: Dr. Lorenso Courier  Primary Care Provider: Prince Solian, MD Consulting Provider: Jeral Pinch, MD   Assessment/Plan:  Postmenopausal patient with recently diagnosed lymphoma currently undergoing work-up found to have a hypermetabolic and enlarged cervix and lower uterine segment on PET scan.  The patient has had regular GYN care and appears up-to-date on her cervical cancer screening (last Pap test at least documented in our system was 4 years ago).  She had an exam in November of last year that was within normal limits.  She has a longstanding history of poor toleration of exams as well as vaginal atrophy (has used vaginal estrogen intermittently) and is not currently sexually active.  Unfortunately, she was not able to tolerate an adequate exam today.  I have recommended that we proceed with outpatient surgery for an exam under anesthesia, possible cervical biopsies, endocervical curettage, endometrial biopsy, and any indicated procedures.  Based on my review of her PET scan, I agree that the cervix and at least lower portion of the uterus appear enlarged and are very metabolically active.  Her last pelvic imaging in epic was in 2018.  At that time, her cervix was very atrophic in appearance, which would signify a change on her recent imaging.  We discussed in general treatment strategies if this were to be a cervix cancer, but I deferred further discussion of treatment until after we have some biopsy results.  While it is very rare, there are reported cases of primary lymphoma of the cervix.  I have not seen metastatic lymphoma to the cervix.  We discussed the plan for exam under anesthesia, cervical biopsies, possible cold knife cone, endocervical curettage, endometrial biopsy, and any other indicated procedures.  If there is any concern that  insufficient tissue will be achieved or if there is an abnormal appearance of the cervix at the time of surgery, I discussed the use of intraoperative frozen section.  We reviewed the risks of surgery which include but are not limited to bleeding, need for blood transfusion, damage to surrounding structures, need for additional surgery, medical complications related to surgery, and rarely, death.  All the patient's questions and concerns were answered.  She was given written preoperative instructions.  A copy of this note was sent to the patient's referring provider.   55 minutes of total time was spent for this patient encounter, including preparation, face-to-face counseling with the patient and coordination of care, and documentation of the encounter.   Jeral Pinch, MD  Division of Gynecologic Oncology  Department of Obstetrics and Gynecology  [facility]  ___________________________________________  Chief Complaint: Chief Complaint  Patient presents with  . Abnormal positron emission tomography (PET) scan    New Patient    History of Present Illness:  Alyssa Elliott is a 69 y.o. y.o. female who is seen in consultation at the request of Dr. Lorenso Courier for an evaluation of FDG avid cervical mass on recent staging PET scan in the setting of lymphoma.  The patient reports overall doing well.  She notes in the last several weeks there has been the growth of a fingerlike projection from her scalp lesion.  Sometimes this area is sore to the touch.  She notes a long history of the decreased appetite without any recent change.  She denies any change changed to her weight.  She denies any nausea, emesis, or  early satiety.  She denies any back pain.  She reports regular bowel and bladder function although notes some urinary urgency and incontinence if she cannot get to the bathroom quickly enough.  In terms of her GYN history.  She went through menopause at  approximately age 89.  She denies any postmenopausal bleeding, discharge, pelvic pain or cramping.  She is a G0 by choice.  She has a remote history (over 40 years ago) of an abnormal Pap smear which she remembers being told was dysplasia.  She denies having any biopsies or cervical procedures at that time.  Her Pap smears have been normal since.  She denies a history of HPV infection or other STDs.  Dr. Talbert Nan is her gynecologist.  She has regular Pap smears and yearly exam.  Her last Pap smear was 2 years ago by her report (last pap test in Epic was 2017), saw Dr. Talbert Nan last in 07/2019 and pelvic exam was normal.  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  2) 03/18/2020: Establish care with Dr. Lorenso Courier 3) 03/30/2020: PET CT scan showed  involvement of the left scalp, left axillary lymph node, left hilar and infrahilar nodes, subcarinal node, and mesenteric lymph nodes, as well as a masslike appearance/ high metabolic activity in the lower uterus/cervix.   PAST MEDICAL HISTORY:  Past Medical History:  Diagnosis Date  . Allergy   . Anxiety   . Asthma   . Broncholithiasis   . Cataract 2018   removed  . Complication of anesthesia    drop in BP  . DDD (degenerative disc disease)   . Depression   . GERD (gastroesophageal reflux disease)   . Hemorrhoids   . Hyperlipidemia   . Lymphoma ([place])   . Vitamin D deficiency      PAST SURGICAL HISTORY:  Past Surgical History:  Procedure Laterality Date  . 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 cataract surgery    . COLONOSCOPY    . FOOT NEUROMA SURGERY     multiple times bilaterally  . PLANTAR FASCIA RELEASE    . SHOULDER ARTHROSCOPY Left 04/19/2019   Procedure:  LEFT SHOULDER MANIPULATION UNDER ANESTHESIA AND ARTHROSCOPY WITH EXTENSIVE DEBRIDEMENT;  Surgeon: Mcarthur Rossetti, MD;  Location: [facility];  Service: Orthopedics;  Laterality: Left;  . UPPER GI ENDOSCOPY      OB/GYN HISTORY:  OB History  Gravida Para Term Preterm AB Living  0 0 0 0 0 0  SAB TAB Ectopic Multiple Live Births  0 0 0 0 0    Patient's last menstrual period was 09/04/2002 (exact date).  Age at menarche: 38 Age at menopause: 2003 Hx of HRT: yes (oral), most recently used vaginal estrogen Hx of STDs: denies Last pap: 2017 - negative History of abnormal pap smears: no  SCREENING STUDIES:  Last mammogram: 2020  Last colonoscopy: 2019 Last bone mineral density: 2018  MEDICATIONS: Outpatient Encounter Medications as of 04/03/2020  Medication Sig  . acetaminophen (TYLENOL) 500 MG tablet Take 1,000 mg by mouth every 6 (six) hours as needed for moderate pain or headache.  . ARIPiprazole (ABILIFY) 2 MG tablet Take 2 mg by mouth daily.   Marland Kitchen aspirin 81 MG tablet Take 81 mg by mouth at bedtime.   . [employer] FIBER  GUMMIES PO Take 2 each by mouth daily.   Marland Kitchen EPINEPHrine 0.3 mg/0.3 mL IJ SOAJ injection Inject 0.3 mg into the muscle as needed for anaphylaxis.   . fluticasone furoate-vilanterol (BREO ELLIPTA) 200-25 MCG/INH AEPB Inhale 1 puff into the lungs daily.  Marland Kitchen GLUCOSAMINE-CHONDROITIN-MSM PO Take 1 tablet by mouth 2 (two) times a day.  . levalbuterol (XOPENEX HFA) 45 MCG/ACT inhaler Inhale 1 puff into the lungs every 4 (four) hours as needed for wheezing or shortness of breath.  . Melatonin 5 MG TABS Take 20 mg by mouth at bedtime.   . meloxicam (MOBIC) 15 MG tablet Take 15 mg by mouth daily as needed for pain.   . NONFORMULARY OR COMPOUNDED ITEM Estradiol 0.02% Insert 1 gram vaginally 2 times per week. 3 month supply with 3 refills.  . nystatin cream (MYCOSTATIN) Apply 1 application topically 2 (two) times daily. Apply to affected area BID for up to 7 days.  . pantoprazole  (PROTONIX) 40 MG tablet Take 40 mg by mouth 2 (two) times daily.  Marland Kitchen PARoxetine (PAXIL) 40 MG tablet Take 40 mg by mouth daily.   . pravastatin (PRAVACHOL) 40 MG tablet Take 40 mg by mouth at bedtime.   Marland Kitchen SINGULAIR 10 MG tablet Take 10 mg by mouth at bedtime.   . Vitamin D, Ergocalciferol, (DRISDOL) 50000 UNITS CAPS Take 50,000 Units by mouth every Monday.    No facility-administered encounter medications on file as of 04/03/2020.    ALLERGIES:  Allergies  Allergen Reactions  . Codeine Nausea And Vomiting  . Penicillins Swelling and Other (See Comments)    Swelling  Of joints Did it involve swelling of the face/tongue/throat, SOB, or low BP? No Did it involve sudden or severe rash/hives, skin peeling, or any reaction on the inside of your mouth or nose? No Did you need to seek medical attention at a hospital or doctor's office? No When did it last happen? Childhood allergy If all above answers are "NO", may proceed with cephalosporin use.   . Latex Rash and Other (See Comments)    redness  . Sulfa Antibiotics Rash     FAMILY HISTORY:  Family History  Problem Relation Age of Onset  . Heart disease Mother   . Diabetes Mother   . Tuberculosis Paternal Grandfather   . Bone cancer Paternal Grandfather   . Breast cancer Sister 90  . Melanoma Sister   . Cirrhosis Maternal Grandfather   . Heart failure Paternal Grandmother   . Colon cancer Neg Hx   . Ovarian cancer Neg Hx   . Uterine cancer Neg Hx      SOCIAL HISTORY:    Social Connections:   . Frequency of Communication with Friends and Family:   . Frequency of Social Gatherings with Friends and Family:   . Attends Religious Services:   . Active Member of Clubs or Organizations:   . Attends Archivist Meetings:   Marland Kitchen Marital Status:     REVIEW OF SYSTEMS:  Denies appetite changes, fevers, chills, fatigue, unexplained weight changes. Denies hearing loss, neck lumps or masses, mouth sores, ringing in ears or voice  changes. Denies cough or wheezing.  Denies shortness of breath. Denies chest pain or palpitations. Denies leg swelling. Denies abdominal distention, pain, blood in stools, constipation, diarrhea, nausea, vomiting, or early satiety. Denies pain with intercourse, dysuria, frequency, hematuria or incontinence. Denies hot flashes, pelvic pain, vaginal bleeding or vaginal discharge.   Denies joint pain, back pain or muscle pain/cramps.  Denies itching, rash, or wounds. Denies dizziness, headaches, numbness or seizures. Denies swollen lymph nodes or glands, denies easy bruising or bleeding. Denies anxiety, depression, confusion, or decreased concentration.  Physical Exam:  Vital Signs for this encounter:  Blood pressure (!) 130/58, pulse 68, temperature 98 F (36.7 C), temperature source Oral, resp. rate 16, height 5\' 4"  (1.626 m), weight 156 lb 6 oz (70.9 kg), last menstrual period 09/04/2002, SpO2 98 %. Body mass index is 26.84 kg/m. General: Alert, oriented, no acute distress.  HEENT: Normocephalic, atraumatic. Sclera anicteric.  Chest: some expiratory wheezing, otherwise clear to auscultation bilaterally. No rhonchi, or rales. Cardiovascular: Regular rate and rhythm, no murmurs, rubs, or gallops.  Abdomen: Normoactive bowel sounds. Soft, nondistended, nontender to palpation. No masses or hepatosplenomegaly appreciated. No palpable fluid wave.  Extremities: Grossly normal range of motion. Warm, well perfused. No edema bilaterally.  Lymphatics: No cervical, supraclavicular, or inguinal adenopathy.  GU:  Normal external female genitalia.  No lesions. No discharge or bleeding.             Speculum exam: Very poorly tolerated by the patient.  I was able to insert the speculum approximately 4 cm into the vagina and open it slightly before the patient became very uncomfortable.  I was able to visualize a small portion of the anterior lip of the cervix.  The vaginal mucosa was overall moderately  atrophic.  No bleeding or discharge noted.  Limited bimanual exam (1 digit used): Was difficult to reach the cervix but it felt smooth and not nodular or firm.  On rectovaginal exam, the cervix/lower uterine segment felt enlarged but again smooth without nodularity.  LABORATORY AND RADIOLOGIC DATA:  Outside medical records were reviewed to synthesize the above history, along with the history and physical obtained during the visit.   Lab Results  Component Value Date   WBC 7.0 03/18/2020   HGB 15.4 (H) 03/18/2020   HCT 47.3 (H) 03/18/2020   PLT 244 03/18/2020   GLUCOSE 91 03/18/2020   ALT 17 03/18/2020   AST 21 03/18/2020   NA 143 03/18/2020   K 4.0 03/18/2020   CL 107 03/18/2020   CREATININE 0.85 03/18/2020   BUN 10 03/18/2020   CO2 26 03/18/2020   PET 03/30/20: IMPRESSION: 1. Deauville 5 activity in a cutaneous mass along the left scalp vertex. 2. Deauville 5 activity in a left axillary lymph node. 3. Deauville 4 activity in left hilar and infrahilar lymph nodes with Deauville 3 activity in a subcarinal lymph node. Deauville 4 activity in mildly enlarged mesenteric lymph node. 4. Masslike appearance and high metabolic activity in the lower uterus and cervix. Cervical cancer or endometrial cancer not excluded. Maximum SUV in this vicinity 17.9. 5.  Aortic Atherosclerosis (ICD10-I70.0).

## 2020-04-02 NOTE — Telephone Encounter (Signed)
Scheduled per los. Called and spoke with patient. Confirmed appt 

## 2020-04-03 ENCOUNTER — Encounter: Payer: Self-pay | Admitting: Gynecologic Oncology

## 2020-04-03 ENCOUNTER — Other Ambulatory Visit: Payer: Self-pay

## 2020-04-03 ENCOUNTER — Inpatient Hospital Stay (HOSPITAL_BASED_OUTPATIENT_CLINIC_OR_DEPARTMENT_OTHER): Payer: Medicare Other | Admitting: Gynecologic Oncology

## 2020-04-03 ENCOUNTER — Encounter (HOSPITAL_BASED_OUTPATIENT_CLINIC_OR_DEPARTMENT_OTHER): Payer: Self-pay | Admitting: Gynecologic Oncology

## 2020-04-03 ENCOUNTER — Ambulatory Visit: Payer: Medicare Other | Admitting: Hematology and Oncology

## 2020-04-03 VITALS — BP 130/58 | HR 68 | Temp 98.0°F | Resp 16 | Ht 64.0 in | Wt 156.4 lb

## 2020-04-03 DIAGNOSIS — E785 Hyperlipidemia, unspecified: Secondary | ICD-10-CM | POA: Diagnosis not present

## 2020-04-03 DIAGNOSIS — R948 Abnormal results of function studies of other organs and systems: Secondary | ICD-10-CM

## 2020-04-03 DIAGNOSIS — K219 Gastro-esophageal reflux disease without esophagitis: Secondary | ICD-10-CM | POA: Diagnosis not present

## 2020-04-03 DIAGNOSIS — E559 Vitamin D deficiency, unspecified: Secondary | ICD-10-CM | POA: Diagnosis not present

## 2020-04-03 DIAGNOSIS — F329 Major depressive disorder, single episode, unspecified: Secondary | ICD-10-CM | POA: Diagnosis not present

## 2020-04-03 DIAGNOSIS — C8261 Cutaneous follicle center lymphoma, lymph nodes of head, face, and neck: Secondary | ICD-10-CM | POA: Diagnosis not present

## 2020-04-03 NOTE — Patient Instructions (Signed)
Preparing for your Surgery  Plan for surgery on April 09, 2020 with Dr. Jeral Pinch at [facility]. You will be scheduled for a examination under anesthesia, pap smear of the cervix, cervical biopsies, possible cold knife conization of the cervix (cone shaped biopsy of the cervix) if needed, endocervical curettage (sample from the inner canal of the cervix), endometrial biopsy (biopsy of the lining of the uterus).   Pre-operative Testing -You will receive a phone call from presurgical testing at [facility] to arrange for a pre-operative appointment over the phone, lab appointment, and COVID test. The COVID test normally happens 3 days prior to the surgery and they ask that you self quarantine after the test up until surgery to decrease chance of exposure.  -Bring your insurance card, copy of an advanced directive if applicable, medication list  -At that visit, you will be asked to sign a consent for a possible blood transfusion in case a transfusion becomes necessary during surgery.  The need for a blood transfusion is rare but having consent is a necessary part of your care.     -You can keep taking your aspirin.  -Do not take supplements such as fish oil (omega 3), red yeast rice, turmeric before your surgery.   Day Before Surgery at [place] will be advised you can have clear liquids up until 3 hours before your surgery.    Your role in recovery Your role is to become active as soon as directed by your doctor, while still giving yourself time to heal.  Rest when you feel tired. You will be asked to do the following in order to speed your recovery:  - Cough and breathe deeply. This helps to clear and expand your lungs and can prevent pneumonia after surgery.  - [place]. Do mild physical activity. Walking or moving your legs help your circulation and body functions return to normal. Do not try to get up or walk alone the  first time after surgery.   -If you develop swelling on one leg or the other, pain in the back of your leg, redness/warmth in one of your legs, please call the office or go to the Emergency Room to have a doppler to rule out a blood clot. For shortness of breath, chest pain-seek care in the Emergency Room as soon as possible. - Actively manage your pain. Managing your pain lets you move in comfort. We will ask you to rate your pain on a scale of zero to 10. It is your responsibility to tell your doctor or nurse where and how much you hurt so your pain can be treated.  Special Considerations -Your final pathology results from surgery should be available around one week after surgery and the results will be relayed to you when available.  -FMLA forms can be faxed to [phone] and please allow 5-7 business days for completion.  Risks of Surgery Risks of surgery are low but include bleeding, infection, damage to surrounding structures, re-operation, blood clots, and very rarely death.   Blood Transfusion Information (For the consent to be signed before surgery)  We will be checking your blood type before surgery so in case of emergencies, we will know what type of blood you would need.  WHAT IS A BLOOD TRANSFUSION?  A transfusion is the replacement of blood or some of its parts. Blood is made up of multiple cells which provide different functions.  Red blood cells carry oxygen and are used for blood loss replacement.  White blood cells fight against infection.  Platelets control bleeding.  Plasma helps clot blood.  Other blood products are available for specialized needs, such as hemophilia or other clotting disorders. BEFORE THE TRANSFUSION  Who gives blood for transfusions?   You may be able to donate blood to be used at a later date on yourself (autologous donation).  Relatives can be asked to donate blood. This is generally not any safer  than if you have received blood from a stranger. The same precautions are taken to ensure safety when a relative's blood is donated.  Healthy volunteers who are fully evaluated to make sure their blood is safe. This is blood bank blood. Transfusion therapy is the safest it has ever been in the practice of medicine. Before blood is taken from a donor, a complete history is taken to make sure that person has no history of diseases nor engages in risky social behavior (examples are intravenous drug use or sexual activity with multiple partners). The donor's travel history is screened to minimize risk of transmitting infections, such as malaria. The donated blood is tested for signs of infectious diseases, such as HIV and hepatitis. The blood is then tested to be sure it is compatible with you in order to minimize the chance of a transfusion reaction. If you or a relative donates blood, this is often done in anticipation of surgery and is not appropriate for emergency situations. It takes many days to process the donated blood. RISKS AND COMPLICATIONS Although transfusion therapy is very safe and saves many lives, the main dangers of transfusion include:   Getting an infectious disease.  Developing a transfusion reaction. This is an allergic reaction to something in the blood you were given. Every precaution is taken to prevent this. The decision to have a blood transfusion has been considered carefully by your caregiver before blood is given. Blood is not given unless the benefits outweigh the risks.  AFTER SURGERY INSTRUCTIONS  Return to work: 1-2 days if applicable  Activity: 1. Be up and out of the bed during the day.  Take a nap if needed.  You may walk up steps but be careful and use the hand rail.  Stair climbing will tire you more than you think, you may need to stop part way and rest.   2. No lifting or straining for 4 weeks over 10 pounds. No pushing, pulling, straining for 6 weeks.  3. No  driving for 48 hours after the procedure.  Make sure that your reaction time has returned.   4. You can shower as soon as the next day after surgery. Shower daily.  Use soap and water on your incision and pat dry; don't rub.  No tub baths or submerging your body in water until cleared by your surgeon. If you have the soap that was given to you by pre-surgical testing that was used before surgery, you do not need to use it afterwards because this can irritate your incisions.   5. No sexual activity and nothing in the vagina for 4 weeks.  6. You may experience vaginal spotting after surgery or around the 6-8 week mark from surgery when the stitches at the top of the vagina begin to dissolve.  The  spotting is normal but if you experience heavy bleeding, call our office.  9. Take Tylenol or ibuprofen for pain. Monitor your Tylenol intake to a max of 4,000 mg in a 24 hour period.  Diet: 1. Low sodium Heart Healthy Diet is recommended.  2. It is safe to use a laxative, such as Miralax or Colace, if you have difficulty moving your bowels.   Reasons to call the Doctor:  Fever - Oral temperature greater than 100.4 degrees Fahrenheit  Foul-smelling vaginal discharge  Difficulty urinating  Nausea and vomiting  Increased pain at the site of the incision that is unrelieved with pain medicine.  Difficulty breathing with or without chest pain  New calf pain especially if only on one side  Sudden, continuing increased vaginal bleeding with or without clots.   Contacts: For questions or concerns you should contact:  Dr. Jeral Pinch at [phone]  Joylene John, NP at [phone]  After Hours: call [phone] and have the GYN Oncologist paged/contacted   Cervical Biopsy  A cervical biopsy is a procedure in which a sample of tissue from the cervix is removed and examined under a microscope to see if cancer cells are present. The cervix is the lowest part of the womb (uterus),  which opens into the vagina (birth canal). You may also have this procedure:  To check for growths that may become cancer.  If the results of your Pap test were abnormal.  If your health care provider saw an abnormality during a pelvic exam. Tell a health care provider about:  Any allergies you have.  All medicines you are taking, including vitamins, herbs, eye drops, creams, and over-the-counter medicines.  Any problems you or family members have had with anesthetic medicines.  Any blood disorders you have.  Any surgeries you have had.  Any medical conditions you have.  Whether you are pregnant or may be pregnant.  Whether you are having your menstrual period or will be having your period at the time of the procedure. What are the risks? Generally, this is a safe procedure. However, problems may occur, including:  Infection.  Bleeding.  Allergic reactions to medicines or dyes.  Damage to other structures or organs. What happens before the procedure? Medicines  You may be given an over-the-counter pain medicine to take right before the procedure.  Ask your health care provider about: ? Changing or stopping your regular medicines. This is especially important if you are taking diabetes medicines or blood thinners. ? Taking medicines such as aspirin and ibuprofen. These medicines can thin your blood. Do not take these medicines unless your health care provider tells you to take them. ? Taking over-the-counter medicines, vitamins, herbs, and supplements. General instructions  Do not douche, have sex, use tampons, or put any medicines in your vagina as told by your health care provider.  Follow instructions from your health care provider about eating or drinking restrictions.  You may be asked to empty your bladder and bowel right before the procedure.  Ask your health care provider what steps will be taken to help prevent infection. These may include: ? Removing hair  at the surgery site. ? Washing skin with a germ-killing soap. What happens after the procedure?  You may have slight bleeding and cramping after the procedure. You will need to wear a sanitary napkin for bleeding.  It is up to you to get the results of your procedure. Ask your health care provider, or the department that is doing the procedure,  when your results will be ready. Summary  A cervical biopsy is a procedure in which a sample of tissue from the cervix is removed and examined under a microscope to see if cancer cells are present.  Generally, this is a safe procedure. However, infection, bleeding, reaction to medicines, or damage to other organs may occur.  Ask your health care provider whether you should stop your regular medicines.  You may be given an over-the-counter pain medicine to take right before the procedure.  Ask your health care provider, or the department that is doing the procedure, when your results will be ready. This information is not intended to replace advice given to you by your health care provider. Make sure you discuss any questions you have with your health care provider. Document Revised: 02/09/2018 Document Reviewed: 02/09/2018 Elsevier Patient Education  2020 [place].   Endometrial Biopsy  Endometrial biopsy is a procedure in which a tissue sample is taken from inside the uterus. The sample is taken from the endometrium, which is the lining of the uterus. The tissue sample is then checked under a microscope to see if the tissue is normal or abnormal. This procedure helps to determine where you are in your menstrual cycle and how hormone levels are affecting the lining of the uterus. This procedure may also be used to evaluate uterine bleeding or to diagnose endometrial cancer, endometrial tuberculosis, polyps, or other inflammatory conditions. Tell a health care provider about:  Any allergies you have.  All medicines you are taking, including  vitamins, herbs, eye drops, creams, and over-the-counter medicines.  Any problems you or family members have had with anesthetic medicines.  Any blood disorders you have.  Any surgeries you have had.  Any medical conditions you have.  Whether you are pregnant or may be pregnant. What are the risks? Generally, this is a safe procedure. However, problems may occur, including:  Bleeding.  Pelvic infection.  Puncture of the wall of the uterus with the biopsy device (rare). What happens during the procedure?  To lower your risk of infection: ? Your health care team will wash or sanitize their hands.  You will lie on an exam table with your feet and legs supported as in a pelvic exam.  Your health care provider will insert an instrument (speculum) into your vagina to see your cervix.  Your cervix will be cleansed with an antiseptic solution.  A medicine (local anesthetic) will be used to numb the cervix.  A forceps instrument (tenaculum) will be used to hold your cervix steady for the biopsy.  A thin, rod-like instrument (uterine sound) will be inserted through your cervix to determine the length of your uterus and the location where the biopsy sample will be removed.  A thin, flexible tube (catheter) will be inserted through your cervix and into the uterus. The catheter will be used to collect the biopsy sample from your endometrial tissue.  The catheter and speculum will then be removed, and the tissue sample will be sent to a lab for examination. What happens after the procedure?  You will rest in a recovery area until you are ready to go home.  You may have mild cramping and a small amount of vaginal bleeding. This is normal.  It is up to you to get the results of your procedure. Ask your health care provider, or the department that is doing the procedure, when your results will be ready. Summary  Endometrial biopsy is a procedure in which a  tissue sample is taken from  the endometrium, which is the lining of the uterus.  This procedure may help to diagnose menstrual cycle problems, abnormal bleeding, or other conditions affecting the endometrium.  Before the procedure, keep a record of your menstrual cycles as told by your health care provider.  The tissue sample that is removed will be checked under a microscope to see if it is normal or abnormal. This information is not intended to replace advice given to you by your health care provider. Make sure you discuss any questions you have with your health care provider. Document Revised: 08/18/2017 Document Reviewed: 09/21/2016 Elsevier Patient Education  [place].

## 2020-04-03 NOTE — Progress Notes (Addendum)
Spoke w/ via phone for pre-op interview---pt Lab needs dos---- none              Lab results------has pre op lab appt 04-06-2020 at 900 am for cbc, bmet  COVID test ------04-06-2020 at 1000 am Arrive at -------1000 am 04-09-2020 NPO after MN NO Solid Food.  Clear liquids from MN until---900 am then npo Medications to take morning of surgery -----breo ellipta inhaler, xopenex inhaler prn/bring inhaler, paroxetine, abilify, pantaprazole Diabetic medication -----n/a Patient Special Instructions -----none Pre-Op special Istructions -----none Patient verbalized understanding of instructions that were given at this phone interview. Patient denies shortness of breath, chest pain, fever, cough a this phone interview.  Ct cardiac scoring 10-10-2019 [facility] everywhere and on chart Chest xray 09-02-2019 epic  Patient instructed to stay on 81 mg aspirin by dr Berline Lopes last dose day before surgery

## 2020-04-06 ENCOUNTER — Encounter (HOSPITAL_COMMUNITY)
Admission: RE | Admit: 2020-04-06 | Discharge: 2020-04-06 | Disposition: A | Discharge status: Home / Self Care | Payer: Medicare Other | Source: Ambulatory Visit | Attending: Hematology | Admitting: Hematology

## 2020-04-06 ENCOUNTER — Other Ambulatory Visit (HOSPITAL_COMMUNITY)
Admission: RE | Admit: 2020-04-06 | Discharge: 2020-04-06 | Disposition: A | Discharge status: Home / Self Care | Payer: Medicare Other | Source: Ambulatory Visit | Attending: Gynecologic Oncology | Admitting: Gynecologic Oncology

## 2020-04-06 ENCOUNTER — Other Ambulatory Visit: Payer: Self-pay

## 2020-04-06 DIAGNOSIS — Z20822 Contact with and (suspected) exposure to covid-19: Secondary | ICD-10-CM | POA: Insufficient documentation

## 2020-04-06 DIAGNOSIS — Z01812 Encounter for preprocedural laboratory examination: Secondary | ICD-10-CM | POA: Diagnosis not present

## 2020-04-06 LAB — CBC
HCT: 46.9 % — ABNORMAL HIGH (ref 36.0–46.0)
Hemoglobin: 15.1 g/dL — ABNORMAL HIGH (ref 12.0–15.0)
MCH: 30.6 pg (ref 26.0–34.0)
MCHC: 32.2 g/dL (ref 30.0–36.0)
MCV: 94.9 fL (ref 80.0–100.0)
Platelets: 222 10*3/uL (ref 150–400)
RBC: 4.94 MIL/uL (ref 3.87–5.11)
RDW: 13 % (ref 11.5–15.5)
WBC: 5.4 10*3/uL (ref 4.0–10.5)
nRBC: 0 % (ref 0.0–0.2)

## 2020-04-06 LAB — BASIC METABOLIC PANEL
Anion gap: 10 (ref 5–15)
BUN: 9 mg/dL (ref 8–23)
CO2: 27 mmol/L (ref 22–32)
Calcium: 9.1 mg/dL (ref 8.9–10.3)
Chloride: 106 mmol/L (ref 98–111)
Creatinine, Ser: 0.81 mg/dL (ref 0.44–1.00)
GFR calc Af Amer: >60 mL/min (ref >60)
GFR calc non Af Amer: >60 mL/min (ref >60)
Glucose, Bld: 104 mg/dL — ABNORMAL HIGH (ref 70–99)
Potassium: 3.9 mmol/L (ref 3.5–5.1)
Sodium: 143 mmol/L (ref 135–145)

## 2020-04-06 LAB — SARS CORONAVIRUS 2 (TAT 6-24 HRS): SARS Coronavirus 2: NEGATIVE

## 2020-04-07 ENCOUNTER — Telehealth: Payer: Self-pay | Admitting: *Deleted

## 2020-04-07 NOTE — Telephone Encounter (Signed)
Patient called to ask if it possible to have left axillary lymph node biopsy done this Thursday while she is also having her PAP under Anesthesia with Enodmetrial biopsy done at the [facility] with Dr. Berline Lopes.    Dr. Lorenso Courier was made aware of her request.  He will reach out to Dr. Berline Lopes to see if this would be a joint case with another surgeon to perform the biopsy.   Patient was made aware that we will try but with such short notice we are unsure if this is feasible, but attempt will be made.  FYI routed to primary nurse.

## 2020-04-08 ENCOUNTER — Telehealth: Payer: Self-pay

## 2020-04-08 NOTE — Telephone Encounter (Signed)
Ms Segoviano states that she understands her pre-op instructions. Ms Eskridge inquired as to whether  or not she would be having a axillary lymph node biopsy tomorrow with a general surgeon. Told Ms Echavarria that Dr. Lorenso Courier is reaching out to [place] physicians.  It will have to be a separate procedure from tomorrow's surgery. Dr. Lorenso Courier will coordinate the surgery referral and reach back out to her when an appointment is set up.

## 2020-04-09 ENCOUNTER — Other Ambulatory Visit: Payer: Self-pay

## 2020-04-09 ENCOUNTER — Ambulatory Visit (HOSPITAL_BASED_OUTPATIENT_CLINIC_OR_DEPARTMENT_OTHER): Payer: Medicare Other | Admitting: Anesthesiology

## 2020-04-09 ENCOUNTER — Other Ambulatory Visit: Payer: Medicare Other

## 2020-04-09 ENCOUNTER — Ambulatory Visit: Payer: Medicare Other

## 2020-04-09 ENCOUNTER — Encounter (HOSPITAL_BASED_OUTPATIENT_CLINIC_OR_DEPARTMENT_OTHER)
Admission: RE | Disposition: A | Discharge status: Home / Self Care | Payer: Self-pay | Source: Other Acute Inpatient Hospital | Attending: Gynecologic Oncology

## 2020-04-09 ENCOUNTER — Encounter (HOSPITAL_BASED_OUTPATIENT_CLINIC_OR_DEPARTMENT_OTHER): Payer: Self-pay | Admitting: Gynecologic Oncology

## 2020-04-09 ENCOUNTER — Ambulatory Visit (HOSPITAL_BASED_OUTPATIENT_CLINIC_OR_DEPARTMENT_OTHER)
Admission: RE | Admit: 2020-04-09 | Discharge: 2020-04-09 | Disposition: A | Discharge status: Home / Self Care | Payer: Medicare Other | Source: Other Acute Inpatient Hospital | Attending: Gynecologic Oncology | Admitting: Gynecologic Oncology

## 2020-04-09 DIAGNOSIS — E785 Hyperlipidemia, unspecified: Secondary | ICD-10-CM | POA: Insufficient documentation

## 2020-04-09 DIAGNOSIS — R9389 Abnormal findings on diagnostic imaging of other specified body structures: Secondary | ICD-10-CM | POA: Diagnosis not present

## 2020-04-09 DIAGNOSIS — F419 Anxiety disorder, unspecified: Secondary | ICD-10-CM | POA: Diagnosis not present

## 2020-04-09 DIAGNOSIS — E559 Vitamin D deficiency, unspecified: Secondary | ICD-10-CM | POA: Diagnosis not present

## 2020-04-09 DIAGNOSIS — Z79899 Other long term (current) drug therapy: Secondary | ICD-10-CM | POA: Diagnosis not present

## 2020-04-09 DIAGNOSIS — J45909 Unspecified asthma, uncomplicated: Secondary | ICD-10-CM | POA: Diagnosis not present

## 2020-04-09 DIAGNOSIS — R87619 Unspecified abnormal cytological findings in specimens from cervix uteri: Secondary | ICD-10-CM | POA: Diagnosis not present

## 2020-04-09 DIAGNOSIS — J471 Bronchiectasis with (acute) exacerbation: Secondary | ICD-10-CM | POA: Diagnosis not present

## 2020-04-09 DIAGNOSIS — N879 Dysplasia of cervix uteri, unspecified: Secondary | ICD-10-CM | POA: Diagnosis not present

## 2020-04-09 DIAGNOSIS — Z7982 Long term (current) use of aspirin: Secondary | ICD-10-CM | POA: Diagnosis not present

## 2020-04-09 DIAGNOSIS — K219 Gastro-esophageal reflux disease without esophagitis: Secondary | ICD-10-CM | POA: Insufficient documentation

## 2020-04-09 DIAGNOSIS — F329 Major depressive disorder, single episode, unspecified: Secondary | ICD-10-CM | POA: Diagnosis not present

## 2020-04-09 DIAGNOSIS — R948 Abnormal results of function studies of other organs and systems: Secondary | ICD-10-CM

## 2020-04-09 LAB — TYPE AND SCREEN
ABO/RH(D): O POS
Antibody Screen: NEGATIVE

## 2020-04-09 LAB — ABO/RH: ABO/RH(D): O POS

## 2020-04-09 SURGERY — EXAM UNDER ANESTHESIA
Anesthesia: General | Site: Vagina

## 2020-04-09 MED ORDER — PROPOFOL 10 MG/ML IV BOLUS
INTRAVENOUS | Status: AC
  Filled 2020-04-09: qty 20

## 2020-04-09 MED ORDER — ESTRADIOL 0.1 MG/GM VA CREA
TOPICAL_CREAM | VAGINAL | Status: DC | PRN
  Administered 2020-04-09: 1 via VAGINAL

## 2020-04-09 MED ORDER — DEXAMETHASONE SODIUM PHOSPHATE 10 MG/ML IJ SOLN
INTRAMUSCULAR | Status: AC
  Filled 2020-04-09: qty 1

## 2020-04-09 MED ORDER — FENTANYL CITRATE (PF) 100 MCG/2ML IJ SOLN
INTRAMUSCULAR | Status: DC | PRN
  Administered 2020-04-09 (×2): 25 ug via INTRAVENOUS
  Administered 2020-04-09: 50 ug via INTRAVENOUS

## 2020-04-09 MED ORDER — MIDAZOLAM HCL 2 MG/2ML IJ SOLN
INTRAMUSCULAR | Status: AC
  Filled 2020-04-09: qty 2

## 2020-04-09 MED ORDER — FENTANYL CITRATE (PF) 100 MCG/2ML IJ SOLN
INTRAMUSCULAR | Status: AC
  Filled 2020-04-09: qty 2

## 2020-04-09 MED ORDER — DEXAMETHASONE SODIUM PHOSPHATE 10 MG/ML IJ SOLN
INTRAMUSCULAR | Status: DC | PRN
  Administered 2020-04-09: 5 mg via INTRAVENOUS

## 2020-04-09 MED ORDER — ACETAMINOPHEN 500 MG PO TABS
1000.0000 mg | ORAL_TABLET | ORAL | Status: AC
  Administered 2020-04-09: 1000 mg via ORAL

## 2020-04-09 MED ORDER — ONDANSETRON HCL 4 MG/2ML IJ SOLN
INTRAMUSCULAR | Status: AC
  Filled 2020-04-09: qty 2

## 2020-04-09 MED ORDER — BUPIVACAINE HCL 0.25 % IJ SOLN
INTRAMUSCULAR | Status: DC | PRN
  Administered 2020-04-09: 8 mL

## 2020-04-09 MED ORDER — FERRIC SUBSULFATE SOLN
Status: DC | PRN
  Administered 2020-04-09: 1

## 2020-04-09 MED ORDER — ONDANSETRON HCL 4 MG/2ML IJ SOLN
INTRAMUSCULAR | Status: DC | PRN
  Administered 2020-04-09: 4 mg via INTRAVENOUS

## 2020-04-09 MED ORDER — PROPOFOL 10 MG/ML IV BOLUS
INTRAVENOUS | Status: DC | PRN
  Administered 2020-04-09: 150 mg via INTRAVENOUS

## 2020-04-09 MED ORDER — LIDOCAINE 2% (20 MG/ML) 5 ML SYRINGE
INTRAMUSCULAR | Status: DC | PRN
  Administered 2020-04-09: 50 mg via INTRAVENOUS

## 2020-04-09 MED ORDER — LACTATED RINGERS IV SOLN: INTRAVENOUS | Status: DC

## 2020-04-09 MED ORDER — ACETAMINOPHEN 500 MG PO TABS
ORAL_TABLET | ORAL | Status: AC
  Filled 2020-04-09: qty 2

## 2020-04-09 MED ORDER — LIDOCAINE 2% (20 MG/ML) 5 ML SYRINGE
INTRAMUSCULAR | Status: AC
  Filled 2020-04-09: qty 5

## 2020-04-09 SURGICAL SUPPLY — 23 items
CANISTER SUCT 3000ML PPV (MISCELLANEOUS) ×2 IMPLANT
COVER WAND RF STERILE (DRAPES) ×2 IMPLANT
CURETTE PIPELLE ENDOMTRL SUCTN (MISCELLANEOUS) IMPLANT
GLOVE BIOGEL PI IND STRL 6.5 (GLOVE) IMPLANT
GLOVE BIOGEL PI IND STRL 7.0 (GLOVE) IMPLANT
GLOVE BIOGEL PI IND STRL 7.5 (GLOVE) IMPLANT
GLOVE BIOGEL PI INDICATOR 6.5 (GLOVE) ×1
GLOVE BIOGEL PI INDICATOR 7.0 (GLOVE) ×1
GLOVE BIOGEL PI INDICATOR 7.5 (GLOVE) ×1
GLOVE SURG SS PI 6.0 STRL IVOR (GLOVE) ×2 IMPLANT
GLOVE SURG SS PI 6.5 STRL IVOR (GLOVE) ×1 IMPLANT
GLOVE SURG SS PI 7.0 STRL IVOR (GLOVE) ×1 IMPLANT
KIT TURNOVER CYSTO (KITS) ×2 IMPLANT
NDL HYPO 25X1 1.5 SAFETY (NEEDLE) ×1 IMPLANT
NEEDLE HYPO 25X1 1.5 SAFETY (NEEDLE) ×2 IMPLANT
NS IRRIG 500ML POUR BTL (IV SOLUTION) ×2 IMPLANT
PACK PERINEAL COLD (PAD) ×2 IMPLANT
PACK VAGINAL WOMENS (CUSTOM PROCEDURE TRAY) ×2 IMPLANT
PIPELLE ENDOMETRIAL SUCTION CU (MISCELLANEOUS) ×2
SCOPETTES 8  STERILE (MISCELLANEOUS) ×2
SCOPETTES 8 STERILE (MISCELLANEOUS) IMPLANT
SYR BULB IRRIG 60ML STRL (SYRINGE) ×2 IMPLANT
TOWEL OR 17X26 10 PK STRL BLUE (TOWEL DISPOSABLE) ×3 IMPLANT

## 2020-04-09 NOTE — Anesthesia Preprocedure Evaluation (Addendum)
Anesthesia Evaluation  Patient identified by MRN, date of birth, ID band Patient awake    Reviewed: Allergy & Precautions, NPO status , Patient's Chart, lab work & pertinent test results  History of Anesthesia Complications (+) history of anesthetic complications  Airway Mallampati: I  TM Distance: >3 FB Neck ROM: Full    Dental  (+) Teeth Intact, Dental Advisory Given, Caps   Pulmonary asthma ,    breath sounds clear to auscultation       Cardiovascular  Rhythm:Regular Rate:Normal     Neuro/Psych    GI/Hepatic GERD  ,  Endo/Other    Renal/GU      Musculoskeletal   Abdominal   Peds  Hematology   Anesthesia Other Findings   Reproductive/Obstetrics                            Anesthesia Physical  Anesthesia Plan  ASA: II  Anesthesia Plan: General   Post-op Pain Management:    Induction:   PONV Risk Score and Plan: Ondansetron and Dexamethasone  Airway Management Planned: LMA  Additional Equipment:   Intra-op Plan:   Post-operative Plan: Extubation in OR  Informed Consent: I have reviewed the patients History and Physical, chart, labs and discussed the procedure including the risks, benefits and alternatives for the proposed anesthesia with the patient or authorized representative who has indicated his/her understanding and acceptance.     Dental advisory given  Plan Discussed with: CRNA and Anesthesiologist  Anesthesia Plan Comments:         Anesthesia Quick Evaluation

## 2020-04-09 NOTE — Interval H&P Note (Signed)
History and Physical Interval Note:  04/09/2020 11:54 AM  Alyssa Elliott  has presented today for surgery, with the diagnosis of ABNORMAL UTERUS/CERVIX ON PET.  The various methods of treatment have been discussed with the patient and family. After consideration of risks, benefits and other options for treatment, the patient has consented to  Procedure(s): EXAM UNDER ANESTHESIA,PAP SMEAR, CERVIX BIOPSIES,POSSIBLE CONIZATION,ENDOCERVICAL CURETTAGE, ENDOMETRIAL BIOPSY (N/A) as a surgical intervention.  The patient's history has been reviewed, patient examined, no change in status, stable for surgery.  I have reviewed the patient's chart and labs.  Questions were answered to the patient's satisfaction.     [name]

## 2020-04-09 NOTE — Transfer of Care (Signed)
Immediate Anesthesia Transfer of Care Note  Patient: Alyssa Elliott  Procedure(s) Performed: [name] UNDER ANESTHESIA,PAP SMEAR, CERVIX BIOPSIES,ENDOCERVICAL CURETTAGE, ENDOMETRIAL BIOPSY (N/A Vagina )  Patient Location: PACU  Anesthesia Type:General  Level of Consciousness: awake, alert  and oriented  Airway & Oxygen Therapy: Patient Spontanous Breathing and Patient connected to nasal cannula oxygen  Post-op Assessment: Report given to RN and Post -op Vital signs reviewed and stable  Post vital signs: Reviewed and stable  Last Vitals:  Vitals Value Taken Time  BP 154/75 04/09/20 1251  Temp    Pulse 88 04/09/20 1254  Resp 16 04/09/20 1254  SpO2 98 % 04/09/20 1254  Vitals shown include unvalidated device data.  Last Pain:  Vitals:   04/09/20 1000  TempSrc: Oral  PainSc: 0-No pain      Patients Stated Pain Goal: 1 (16/55/37 [id number])  Complications: No complications documented.

## 2020-04-09 NOTE — Discharge Instructions (Addendum)
04/09/2020  Return to work: 1 day if applicable  You will need to insert the vaginal estrogen provided in the tube after your procedure in the vagina every night for the next two weeks to help the vaginal tissues heal.  You can use a dime size amount on your finger and place it in the vagina 1 inch above the opening when you are going to bed so it will not leak out.  Activity: 1. Be up and out of the bed during the day.  Take a nap if needed.  You may walk up steps but be careful and use the hand rail.  Stair climbing will tire you more than you think, you may need to stop part way and rest.   2. No lifting or straining for 1 weeks.  3. No driving for a minimum of 24 hours after the procedure. You want to make sure that your reaction time has returned.  4. Shower daily.  Use soap and water on your incision and pat dry; don't rub.  No tub baths until cleared by your surgeon.   5. No sexual activity and nothing in the vagina for 4 weeks.  6. You may experience vaginal spotting after surgery.  The spotting is normal but if you experience heavy bleeding, call our office.  8. Take Tylenol or ibuprofen for pain. Monitor your Tylenol intake to a max of 4,000 mg.  Diet: 1. Low sodium Heart Healthy Diet is recommended.  2. It is safe to use a laxative, such as Miralax or Colace, if you have difficulty moving your bowels.   Wound Care: 1. Keep clean and dry.  Shower daily.  Reasons to call the Doctor:  Fever - Oral temperature greater than 100.4 degrees Fahrenheit  Foul-smelling vaginal discharge  Difficulty urinating  Nausea and vomiting  Difficulty breathing with or without chest pain  New calf pain especially if only on one side  Sudden, continuing increased vaginal bleeding with or without clots.   Contacts: For questions or concerns you should contact:  Dr. Jeral Pinch at [phone]  Joylene John, NP at [phone]  After Hours: call [phone] and have the  GYN Oncologist paged/contacted   Post Anesthesia Home Care Instructions  Activity: Get plenty of rest for the remainder of the day. A responsible adult should stay with you for 24 hours following the procedure.  For the next 24 hours, DO NOT: -Drive a car -Paediatric nurse -Drink alcoholic beverages -Take any medication unless instructed by your physician -Make any legal decisions or sign important papers.  Meals: Start with liquid foods such as gelatin or soup. Progress to regular foods as tolerated. Avoid greasy, spicy, heavy foods. If nausea and/or vomiting occur, drink only clear liquids until the nausea and/or vomiting subsides. Call your physician if vomiting continues.  Special Instructions/Symptoms: Your throat may feel dry or sore from the anesthesia or the breathing tube placed in your throat during surgery. If this causes discomfort, gargle with warm salt water. The discomfort should disappear within 24 hours.  If you had a scopolamine patch placed behind your ear for the management of post- operative nausea and/or vomiting:  1. The medication in the patch is effective for 72 hours, after which it should be removed.  Wrap patch in a tissue and discard in the trash. Wash hands thoroughly with soap and water. 2. You may remove the patch earlier than 72 hours if you experience unpleasant side effects which may include dry mouth, dizziness or visual  disturbances. 3. Avoid touching the patch. Wash your hands with soap and water after contact with the patch.

## 2020-04-09 NOTE — Op Note (Signed)
PATIENT: Alyssa Elliott DATE: 04/09/20   Preop Diagnosis: Abnormal appearance of the cervix and LUS on PET  Postoperative Diagnosis: same as above  Surgery: EUA, pap smear collection, cervical biopsies, ECC, EMB  Surgeons:  Jeral Pinch, MD Assistant: Joylene John  Anesthesia: MAC  Estimated blood loss: 25 ml  IVF:  See I&O flowsheet   Urine output: none   Complications: None apaprent  Pathology: Pap test, cervical biopsies (2 and 9 o'clock), ECC, EMB  Operative findings: on EUA, very narrow pelvis, introitus and vaginal canal. Uterus small and mobile, rather flush with the vagina. No masses or nodularity appreciated on bimanual or rectovaginal exam. On speculum exam, cervix 2-3cm, nulliparous in appearance without lesions. Vagina atrophic and tears easily. Cervical os stenotic but dilates easily with dilators. ECC and EMB performed. Uterus sounded to 6cm.   Procedure: The patient was identified in the preoperative holding area. Informed consent was signed on the chart. Patient was seen history was reviewed and exam was performed.   The patient was then taken to the operating room and placed in the supine position with SCD hose on. General anesthesia was then induced without difficulty. She was then placed in the dorsolithotomy position. The perineum was prepped with Betadine. The vagina was prepped with Betadine. The patient was then draped after the prep was dried.  Timeout was performed the patient, procedure, antibiotic, allergy, and length of procedure.   The Peterson speculum was placed in the posterior vagina. Pap test and HPV was collected after cervix was wiped free of betadine. The single tooth tenaculum was placed on the anterior lip of the cervix. A 26F Pratt dilator was used to cannulate the cervical os. An ECC was performed with little tissue obtained. Two random cervical biopsies were taken. The uterine sound was placed in the cervix and advanced to  the fundus. The cervix was successively dilated using pratts dilators to 64F. An EMB was performed x2 passes with minimal tissue obtained.   The specimen was collected on a telfa and sent for permanent pathology.  The tenaculum was removed and hemostasis was observed.   The vagina was irrigated. Given the oozing from the vaginal mucosa at the level of the cervix, Monsels used to achieve hemostasis.   All instrument, suture, laparotomy, Ray-Tec, and needle counts were correct x2.  The patient tolerated the procedure well and was taken recovery room in stable condition.   Jeral Pinch MD Gynecologic Oncology

## 2020-04-09 NOTE — Anesthesia Postprocedure Evaluation (Signed)
Anesthesia Post Note  Patient: Alyssa Elliott  Procedure(s) Performed: [name] UNDER ANESTHESIA,PAP SMEAR, CERVIX BIOPSIES,ENDOCERVICAL CURETTAGE, ENDOMETRIAL BIOPSY (N/A Vagina )     Patient location during evaluation: PACU Anesthesia Type: General Level of consciousness: awake and alert Pain management: pain level controlled Vital Signs Assessment: post-procedure vital signs reviewed and stable Respiratory status: spontaneous breathing, nonlabored ventilation, respiratory function stable and patient connected to nasal cannula oxygen Cardiovascular status: blood pressure returned to baseline and stable Postop Assessment: no apparent nausea or vomiting Anesthetic complications: no   No complications documented.  Last Vitals:  Vitals:   04/09/20 1300 04/09/20 1427  BP: (!) 128/60 117/69  Pulse: 86 72  Resp: 15 16  Temp:  37.6 C  SpO2: 98% 95%    Last Pain:  Vitals:   04/09/20 1427  TempSrc:   PainSc: 0-No pain                 ODDONO,ERNEST

## 2020-04-09 NOTE — Anesthesia Procedure Notes (Signed)
Procedure Name: LMA Insertion Date/Time: 04/09/2020 12:08 PM Performed by: Bufford Spikes, CRNA Pre-anesthesia Checklist: Patient identified, Emergency Drugs available, Suction available and Patient being monitored Patient Re-evaluated:Patient Re-evaluated prior to induction Oxygen Delivery Method: Circle system utilized Preoxygenation: Pre-oxygenation with 100% oxygen Induction Type: IV induction Ventilation: Mask ventilation without difficulty LMA: LMA inserted LMA Size: 4.0 Number of attempts: 1 Airway Equipment and Method: Bite block Placement Confirmation: positive ETCO2 Tube secured with: Tape Dental Injury: Teeth and Oropharynx as per pre-operative assessment

## 2020-04-10 ENCOUNTER — Ambulatory Visit: Payer: Medicare Other | Admitting: Hematology and Oncology

## 2020-04-10 ENCOUNTER — Telehealth: Payer: Self-pay

## 2020-04-10 ENCOUNTER — Encounter: Payer: Self-pay | Admitting: Gynecologic Oncology

## 2020-04-10 ENCOUNTER — Other Ambulatory Visit: Payer: Self-pay | Admitting: Gynecologic Oncology

## 2020-04-10 DIAGNOSIS — R948 Abnormal results of function studies of other organs and systems: Secondary | ICD-10-CM

## 2020-04-10 LAB — SURGICAL PATHOLOGY

## 2020-04-10 LAB — CYTOLOGY - PAP: Diagnosis: NEGATIVE

## 2020-04-10 NOTE — Telephone Encounter (Signed)
Ms Klich states that she is doing pretty well. She is eating, drinking, and urinating well. She is passing gas. She will add 1 capful of Miralax this evening and take BID tomorrow if no BM.  Afebrile. Pt is sore in the pelvic area when she walks from the procedure.  She is taking tylenol prn. Suggested that she take 400-600 mg of Ibuprofen every 6 hours as needed. She is aware of her post op Web EX on 04-17-20 with Dr. Berline Lopes at 1330 and the office number [phone] if she has any questions or concerns.

## 2020-04-10 NOTE — Progress Notes (Signed)
I called the patient to discuss her Pap test and biopsies from surgery on Wednesday. We walked through all of the biopsy results which include histologic evidence of HPV infection, but no clear dysplasia or malignancy. I reviewed that there was no definite endometrial tissue on endometrial biopsy, which I was not surprised to see given the scant tissue obtained at the time of surgery that looked mostly like mucus. Given the discrepancy  between my exam and the pet results, I am recommending we move forward with a pelvic MRI. Based on those results, we may ultimately need to return to the operating room for an additional procedures such as a cold knife cone and hysterectomy. All the patients questions as well as her husbands were answered.  Jeral Pinch MD

## 2020-04-13 ENCOUNTER — Other Ambulatory Visit: Payer: Self-pay | Admitting: Gynecologic Oncology

## 2020-04-13 ENCOUNTER — Telehealth: Payer: Self-pay

## 2020-04-13 ENCOUNTER — Other Ambulatory Visit: Payer: Self-pay

## 2020-04-13 ENCOUNTER — Ambulatory Visit
Admission: RE | Admit: 2020-04-13 | Discharge: 2020-04-13 | Disposition: A | Discharge status: Home / Self Care | Payer: Medicare Other | Source: Ambulatory Visit | Attending: Obstetrics and Gynecology | Admitting: Obstetrics and Gynecology

## 2020-04-13 DIAGNOSIS — R948 Abnormal results of function studies of other organs and systems: Secondary | ICD-10-CM

## 2020-04-13 DIAGNOSIS — Z1231 Encounter for screening mammogram for malignant neoplasm of breast: Secondary | ICD-10-CM

## 2020-04-13 NOTE — Telephone Encounter (Signed)
Ms Keagle actually r/s  the MRI from 04-20-20 to 04-24-20 at 1 pm due to a conflict with her schedule.

## 2020-04-14 DIAGNOSIS — J301 Allergic rhinitis due to pollen: Secondary | ICD-10-CM | POA: Diagnosis not present

## 2020-04-14 DIAGNOSIS — J3089 Other allergic rhinitis: Secondary | ICD-10-CM | POA: Diagnosis not present

## 2020-04-14 NOTE — Telephone Encounter (Signed)
Pt called stating that she is still waiting to receive her therapeutic shoes. I reached out to [place] to see what else was needed from our office. Mendel Ryder states that they cannot accept the order signed by NP, It has to be a DO that signs the order for approval from medicare. Mendel Ryder did refax over the orders that needed to be signed during evaluation. Pt is scheduled with Dr. Roda Shutters on 04/21/20. Forms are at [facility] in [place].

## 2020-04-16 ENCOUNTER — Other Ambulatory Visit: Payer: Self-pay | Admitting: Gynecologic Oncology

## 2020-04-16 ENCOUNTER — Encounter (HOSPITAL_COMMUNITY): Payer: Self-pay | Admitting: Radiology

## 2020-04-16 DIAGNOSIS — R948 Abnormal results of function studies of other organs and systems: Secondary | ICD-10-CM

## 2020-04-16 NOTE — Progress Notes (Signed)
Alyssa Elliott. [name], 69 y.o., [birth year] MRN:  [id number] Phone:  [phone] [name]) PCP:  Prince Solian, MD Primary Cvg:  Medicare/Medicare Part A And B Next Appt With Radiology ([facility]-MR 1) 04/24/2020 at 1:00 PM  RE: CT Biopsy Received: Today [name], MD  Garth Bigness D Set up CT guided biopsy of pelvic mass w/ me at [facility] or [place]       Previous Messages   ----- Message -----  From: Garth Bigness D  Sent: 04/16/2020  4:11 PM EDT  To: [name], MD  Subject: CT Biopsy                     Procedure: CT Biopsy   Reason: Abnormal positron emission tomography (PET) scan, pelvic mass biopsy, discussed with Dr.Yamagata   History: NM PET in computer   Provider: [name]   Provider Contact: [phone]

## 2020-04-17 ENCOUNTER — Other Ambulatory Visit: Payer: Self-pay | Admitting: Radiology

## 2020-04-17 ENCOUNTER — Telehealth: Payer: Self-pay | Admitting: Gynecologic Oncology

## 2020-04-17 ENCOUNTER — Ambulatory Visit: Payer: Medicare Other | Admitting: Gynecologic Oncology

## 2020-04-17 ENCOUNTER — Telehealth: Payer: Self-pay | Admitting: *Deleted

## 2020-04-17 NOTE — Telephone Encounter (Signed)
Called the patient to return her phone call.  I had ordered a CT-guided biopsy yesterday and sent her a message in my chart about the reason for this.  I answered all of her questions today and she is agreeable to proceeding with both the biopsy and MRI next week.  She knows that we will talk about the results once I have both.  Jeral Pinch MD Gynecologic Oncology

## 2020-04-17 NOTE — Telephone Encounter (Signed)
Patient called and left a message questioning the reason for more biopsy tests. Message forwarded to Dr Berline Lopes

## 2020-04-20 ENCOUNTER — Ambulatory Visit (HOSPITAL_COMMUNITY): Admission: RE | Admit: 2020-04-20 | Payer: Medicare Other | Source: Ambulatory Visit

## 2020-04-20 DIAGNOSIS — J301 Allergic rhinitis due to pollen: Secondary | ICD-10-CM | POA: Diagnosis not present

## 2020-04-20 DIAGNOSIS — J3089 Other allergic rhinitis: Secondary | ICD-10-CM | POA: Diagnosis not present

## 2020-04-20 NOTE — Consult Note (Signed)
Chief Complaint: Pelvic Mass. Request is for a pelvic mass biopsy  Referring Physician(s): Dr. Dorian Pod  Supervising Physician: [name]  Patient Status: [facility] - Out-pt  History of Present Illness: Alyssa Elliott is a 69 y.o. female History of asthma,HLD, depression, lyphoma of the head. Treatment planning from PET scan from 7.12.21 revealed a  mass like appearance and high metabolic activity in the lower uterus and cervix. Cervcial cancer or endometrial cancer not excluded.  On 7.22.21 patient had EUA, pap smear, cervical biopsy, ECC and EMB. Biopsy results did not show dysplasia or malignancy. Team is requesting pelvic biopsy for further determination and possible second primary.   Past Medical History:  Diagnosis Date  . Allergy   . Anxiety   . Asthma   . Cataract 2018   removed  . Complication of anesthesia    drop in BP  . DDD (degenerative disc disease)   . Depression   . GERD (gastroesophageal reflux disease)   . Hemorrhoids   . Hyperlipidemia   . Lymphoma ([place]) dx 2021   no tx for now  . Vitamin D deficiency     Past Surgical History:  Procedure Laterality Date  . ATRIAL ABLATION SURGERY  1997   SVT  . cataract surgery Bilateral 2017  . COLONOSCOPY  2016  . FOOT NEUROMA SURGERY     multiple times bilaterally  . PLANTAR FASCIA RELEASE Left 1999 or 2000  . SHOULDER ARTHROSCOPY Left 04/19/2019   Procedure: LEFT SHOULDER MANIPULATION UNDER ANESTHESIA AND ARTHROSCOPY WITH EXTENSIVE DEBRIDEMENT;  Surgeon: Mcarthur Rossetti, MD;  Location: [facility]S;  Service: Orthopedics;  Laterality: Left;  . UPPER GI ENDOSCOPY  yrs ago    Allergies: Codeine, Penicillins, Latex, and Sulfa antibiotics  Medications: Prior to Admission medications   Medication Sig Start Date End Date Taking? Authorizing Provider  acetaminophen (TYLENOL) 500 MG tablet Take 1,000 mg by mouth every 6 (six) hours as needed for moderate pain or headache.    [provider]  ARIPiprazole (ABILIFY) 2 MG tablet Take 2 mg by mouth daily.  01/01/19   [provider]  aspirin 81 MG tablet Take 81 mg by mouth at bedtime.     [provider]  [employer] FIBER GUMMIES PO Take 2 each by mouth daily.     [provider]  EPINEPHrine 0.3 mg/0.3 mL IJ SOAJ injection Inject 0.3 mg into the muscle as needed for anaphylaxis.  12/28/18   [provider]  fluticasone furoate-vilanterol (BREO ELLIPTA) 200-25 MCG/INH AEPB Inhale 1 puff into the lungs daily.    [provider]  GLUCOSAMINE-CHONDROITIN-MSM PO Take 1 tablet by mouth 2 (two) times a day.    [provider]  levalbuterol (XOPENEX HFA) 45 MCG/ACT inhaler Inhale 1 puff into the lungs every 4 (four) hours as needed for wheezing or shortness of breath. 09/02/19   Rigoberto Noel, MD  Melatonin 5 MG TABS Take 20 mg by mouth at bedtime.     [provider]  meloxicam (MOBIC) 15 MG tablet Take 15 mg by mouth daily as needed for pain.  Patient not taking: Reported on 04/03/2020 04/14/14   [provider]  NONFORMULARY OR COMPOUNDED ITEM Estradiol 0.02% Insert 1 gram vaginally 2 times per week. 3 month supply with 3 refills. Patient not taking: Reported on 04/03/2020 08/06/19   Salvadore Dom, MD  nystatin cream (MYCOSTATIN) Apply 1 application topically 2 (two) times daily. Apply to affected area BID for up  to 7 days. 08/05/19   Salvadore Dom, MD  pantoprazole (PROTONIX) 40 MG tablet Take 40 mg by mouth 2 (two) times daily. 02/05/19   [provider]  PARoxetine (PAXIL) 40 MG tablet Take 40 mg by mouth daily.  05/24/15   [provider]  pravastatin (PRAVACHOL) 40 MG tablet Take 40 mg by mouth at bedtime.  04/25/11   [provider]  SINGULAIR 10 MG tablet Take 10 mg by mouth at bedtime.  04/08/11   [provider]  Vitamin D, Ergocalciferol, (DRISDOL) 50000 UNITS CAPS Take 50,000 Units by mouth every Monday.  04/25/11   [provider]     Family History  Problem Relation Age of Onset  . Heart disease Mother   . Diabetes Mother   . Tuberculosis Paternal Grandfather   . Bone cancer Paternal Grandfather   . Breast cancer Sister 25  . Melanoma Sister   . Cirrhosis Maternal Grandfather   . Heart failure Paternal Grandmother   . Colon cancer Neg Hx   . Ovarian cancer Neg Hx   . Uterine cancer Neg Hx     Social History   Socioeconomic History  . Marital status: Married    Spouse name: Not on file  . Number of children: 0  . Years of education: Not on file  . Highest education level: Not on file  Occupational History  . Occupation: retired Catering manager  Tobacco Use  . Smoking status: Never Smoker  . Smokeless tobacco: Never Used  Vaping Use  . Vaping Use: Never used  Substance and Sexual Activity  . Alcohol use: No  . Drug use: No  . Sexual activity: Not Currently    Partners: Male    Birth control/protection: Post-menopausal  Other Topics Concern  . Not on file  Social History Narrative  . Not on file   Social Determinants of Health   Financial Resource Strain:   . Difficulty of Paying Living Expenses:   Food Insecurity:   . Worried About Charity fundraiser in the Last Year:   . Arboriculturist in the Last Year:   Transportation Needs:   . Film/video editor (Medical):   Marland Kitchen Lack of Transportation (Non-Medical):   Physical Activity:   . Days of Exercise per Week:   . Minutes of Exercise per Session:   Stress:   . Feeling of Stress :   Social Connections:   . Frequency of Communication with Friends and Family:   . Frequency of Social Gatherings with Friends and Family:   . Attends Religious Services:   . Active Member of Clubs or Organizations:   . Attends Archivist Meetings:   Marland Kitchen Marital Status:     Review of Systems: A 12 point ROS discussed and pertinent positives are indicated in the HPI above.  All other systems are negative.  Review of Systems    Constitutional: Negative for fatigue and fever.  HENT: Negative for congestion.   Respiratory: Negative for cough and shortness of breath.   Gastrointestinal: Negative for abdominal pain, diarrhea, nausea and vomiting.    Vital Signs: BP 127/72   Pulse 73   Temp 97.9 F (36.6 C) (Oral)   Resp 18   LMP 09/04/2002 (Exact Date)   SpO2 98%   Physical Exam Vitals and nursing note reviewed.  Constitutional:      Appearance: She is well-developed.  HENT:     Head: Normocephalic and atraumatic.  Eyes:  Conjunctiva/sclera: Conjunctivae normal.  Cardiovascular:     Rate and Rhythm: Normal rate and regular rhythm.     Heart sounds: Normal heart sounds.  Pulmonary:     Effort: Pulmonary effort is normal.     Breath sounds: Normal breath sounds.  Musculoskeletal:        General: Normal range of motion.     Cervical back: Normal range of motion.  Skin:    General: Skin is warm.  Neurological:     Mental Status: She is alert and oriented to person, place, and time.     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 EXAM: NUCLEAR MEDICINE PET WHOLE BODY TECHNIQUE: 8.0 mCi F-18 FDG was injected intravenously. Full-ring PET imaging was performed from the skull base to thigh after the radiotracer. CT data was obtained and used for attenuation correction and anatomic localization. Fasting blood glucose: 102 mg/dl COMPARISON:  CT chest 11/17/2018 and CT pelvis from 11/28/2016 FINDINGS: Mediastinal blood pool activity: SUV max 2.8 Hepatic activity: Representative SUV max 4.4 HEAD/NECK: Cutaneous mass along the left vertex measuring 2.0 by 1.5 cm on image 11/4 has a maximum SUV of 14.3, compatible with Deauville 5 activity. No other pathologically enlarged or hypermetabolic nodal activity in the neck. Incidental CT findings: none CHEST: Left axillary node 1.2 cm in short axis on image 86/4,  maximum SUV 13.5, compatible with Deauville 5 activity. Small left hilar and infrahilar lymph nodes are observed with a maximum SUV of 4.6 compatible with Deauville 4 activity. Subcarinal node measuring 0.8 cm in short axis on image 95/4 maximum SUV 4.4, compatible with Deauville 3 activity. Right axillary lymph node 0.5 cm in short axis on image 86/4, maximum SUV 2.4 compatible with Deauville 2 activity. Incidental CT findings: Mild branch vessel atherosclerotic vascular disease. ABDOMEN/PELVIS: Left eccentric mass mesenteric lymph node adjacent to the transverse duodenum measures 1.0 cm in diameter and has a maximum SUV of 5.8 (image 50/4). This is compatible with Deauville 4 disease. Other smaller mesenteric lymph nodes have a less degree of activity no hypermetabolic splenic activity or splenomegaly. There a masslike and hypermetabolic appearance of the lower uterus and cervix. Cervical cancer not excluded. The cervical region measures approximately 5.7 by 3.4 cm on image 198/4. Maximum SUV in this vicinity 17.9 (Deauville 5 level of activity). Incidental CT findings: Aortoiliac atherosclerotic vascular disease. SKELETON: No significant abnormal hypermetabolic activity in this region. Incidental CT findings: Osteochondral lesion along the posterior patellar apex on the right. EXTREMITIES: No significant abnormal hypermetabolic activity in this region. Incidental CT findings: none IMPRESSION: 1. Deauville 5 activity in a cutaneous mass along the left scalp vertex. 2. Deauville 5 activity in a left axillary lymph node. 3. Deauville 4 activity in left hilar and infrahilar lymph nodes with Deauville 3 activity in a subcarinal lymph node. Deauville 4 activity in mildly enlarged mesenteric lymph node. 4. Masslike appearance and high metabolic activity in the lower uterus and cervix. Cervical cancer or endometrial cancer not excluded. Maximum SUV in this vicinity 17.9. 5.  Aortic Atherosclerosis (ICD10-I70.0).  Deauville Scale: Electronically Signed   By: Van Clines M.D.   On: 03/30/2020 15:18   MM 3D SCREEN BREAST BILATERAL  Result Date: 04/15/2020 CLINICAL DATA:  Screening. EXAM: DIGITAL SCREENING BILATERAL MAMMOGRAM WITH TOMO AND CAD COMPARISON:  Previous exam(s). ACR Breast Density Category b: There are scattered areas of fibroglandular density. FINDINGS: There are no  findings suspicious for malignancy. Images were processed with CAD. IMPRESSION: No mammographic evidence of malignancy. A result letter of this screening mammogram will be mailed directly to the patient. RECOMMENDATION: Screening mammogram in one year. (Code:SM-B-01Y) BI-RADS CATEGORY  1: Negative. Electronically Signed   By: Lillia Mountain M.D.   On: 04/15/2020 08:07    Labs:  CBC: Recent Labs    03/18/20 0912 04/06/20 0855 04/21/20 0756  WBC 7.0 5.4 5.7  HGB 15.4* 15.1* 14.6  HCT 47.3* 46.9* 45.0  PLT 244 222 222    COAGS: Recent Labs    04/21/20 0756  INR 1.0    BMP: Recent Labs    03/18/20 0912 04/06/20 0855  NA 143 143  K 4.0 3.9  CL 107 106  CO2 26 27  GLUCOSE 91 104*  BUN 10 9  CALCIUM 9.2 9.1  CREATININE 0.85 0.81  GFRNONAA >60 >60  GFRAA >60 >60    LIVER FUNCTION TESTS: Recent Labs    03/18/20 0912  BILITOT 0.4  AST 21  ALT 17  ALKPHOS 119  PROT 7.1  ALBUMIN 3.7    Assessment and Plan:  69 y.o, female outpatient. History of asthma,HLD, depression, lyphoma of the head. Treatment planning from PET scan from 7.12.21 revealed a  mass like appearance and high metabolic activity in the lower uterus and cervix. Cervcial cancer or endometrial cancer not excluded.  On 7.22.21 patient had EUA, pap smear, cervical biopsy, ECC and EMB. Biopsy results did not show dysplasia or malignancy. Team is requesting pelvic biopsy for further determination and possible second primary.  Patient is allergic to Codeine, PCN, Latex and Sulfa,   All labs and medications are within acceptable parameters.   Patient is afebrile.  Risks and benefits of pelvic mass biopsy was discussed with the patient and/or patient's family including, but not limited to bleeding, infection, damage to adjacent structures or low yield requiring additional tests.  All of the questions were answered and there is agreement to proceed.  Consent signed and in chart.      Thank you for this interesting consult.  I greatly enjoyed meeting Alyssa Elliott and look forward to participating in their care.  A copy of this report was sent to the requesting provider on this date.  Electronically Signed: Jacqualine Mau, NP 04/21/2020, 9:02 AM   I spent a total of  40 Minutes   in face to face in clinical consultation, greater than 50% of which was counseling/coordinating care for pelvic mass biopsy

## 2020-04-21 ENCOUNTER — Ambulatory Visit
Admit: 2020-04-21 | Discharge: 2020-04-21 | Payer: Medicare (Managed Care) | Attending: Internal Medicine | Primary: Family

## 2020-04-21 ENCOUNTER — Ambulatory Visit: Attending: Internal Medicine | Primary: Family

## 2020-04-21 ENCOUNTER — Other Ambulatory Visit: Payer: Self-pay

## 2020-04-21 ENCOUNTER — Encounter (HOSPITAL_COMMUNITY): Payer: Self-pay

## 2020-04-21 ENCOUNTER — Ambulatory Visit (HOSPITAL_COMMUNITY)
Admission: RE | Admit: 2020-04-21 | Discharge: 2020-04-21 | Disposition: A | Discharge status: Home / Self Care | Payer: Medicare Other | Source: Ambulatory Visit | Attending: Gynecologic Oncology | Admitting: Gynecologic Oncology

## 2020-04-21 DIAGNOSIS — I739 Peripheral vascular disease, unspecified: Secondary | ICD-10-CM

## 2020-04-21 DIAGNOSIS — K219 Gastro-esophageal reflux disease without esophagitis: Secondary | ICD-10-CM | POA: Diagnosis not present

## 2020-04-21 DIAGNOSIS — Z79899 Other long term (current) drug therapy: Secondary | ICD-10-CM | POA: Diagnosis not present

## 2020-04-21 DIAGNOSIS — R1900 Intra-abdominal and pelvic swelling, mass and lump, unspecified site: Secondary | ICD-10-CM | POA: Insufficient documentation

## 2020-04-21 DIAGNOSIS — Z7902 Long term (current) use of antithrombotics/antiplatelets: Secondary | ICD-10-CM | POA: Diagnosis not present

## 2020-04-21 DIAGNOSIS — R948 Abnormal results of function studies of other organs and systems: Secondary | ICD-10-CM | POA: Insufficient documentation

## 2020-04-21 DIAGNOSIS — E559 Vitamin D deficiency, unspecified: Secondary | ICD-10-CM | POA: Insufficient documentation

## 2020-04-21 DIAGNOSIS — Z7982 Long term (current) use of aspirin: Secondary | ICD-10-CM | POA: Diagnosis not present

## 2020-04-21 DIAGNOSIS — E785 Hyperlipidemia, unspecified: Secondary | ICD-10-CM | POA: Insufficient documentation

## 2020-04-21 DIAGNOSIS — J45909 Unspecified asthma, uncomplicated: Secondary | ICD-10-CM | POA: Diagnosis not present

## 2020-04-21 DIAGNOSIS — C8599 Non-Hodgkin lymphoma, unspecified, extranodal and solid organ sites: Secondary | ICD-10-CM | POA: Diagnosis not present

## 2020-04-21 DIAGNOSIS — F329 Major depressive disorder, single episode, unspecified: Secondary | ICD-10-CM | POA: Diagnosis not present

## 2020-04-21 DIAGNOSIS — F419 Anxiety disorder, unspecified: Secondary | ICD-10-CM | POA: Diagnosis not present

## 2020-04-21 LAB — CBC WITH DIFFERENTIAL/PLATELET
Abs Immature Granulocytes: 0.03 10*3/uL (ref 0.00–0.07)
Basophils Absolute: 0.1 10*3/uL (ref 0.0–0.1)
Basophils Relative: 1 %
Eosinophils Absolute: 0.4 10*3/uL (ref 0.0–0.5)
Eosinophils Relative: 8 %
HCT: 45 % (ref 36.0–46.0)
Hemoglobin: 14.6 g/dL (ref 12.0–15.0)
Immature Granulocytes: 1 %
Lymphocytes Relative: 26 %
Lymphs Abs: 1.5 10*3/uL (ref 0.7–4.0)
MCH: 30.9 pg (ref 26.0–34.0)
MCHC: 32.4 g/dL (ref 30.0–36.0)
MCV: 95.1 fL (ref 80.0–100.0)
Monocytes Absolute: 0.6 10*3/uL (ref 0.1–1.0)
Monocytes Relative: 10 %
Neutro Abs: 3.1 10*3/uL (ref 1.7–7.7)
Neutrophils Relative %: 54 %
Platelets: 222 10*3/uL (ref 150–400)
RBC: 4.73 MIL/uL (ref 3.87–5.11)
RDW: 12.9 % (ref 11.5–15.5)
WBC: 5.7 10*3/uL (ref 4.0–10.5)
nRBC: 0 % (ref 0.0–0.2)

## 2020-04-21 LAB — PROTIME-INR
INR: 1 (ref 0.8–1.2)
Prothrombin Time: 12.9 seconds (ref 11.4–15.2)

## 2020-04-21 LAB — AMB POC HEMOGLOBIN A1C
Hemoglobin A1C, POC: 6.1 % — AB (ref 4.8–5.6)
Hemoglobin A1c (POC): 6.1 % — AB (ref 4.8–5.6)

## 2020-04-21 MED ORDER — CARVEDILOL 3.125 MG TAB
3.125 mg | ORAL_TABLET | Freq: Two times a day (BID) | ORAL | 5 refills | Status: AC
Start: 2020-04-21 — End: ?

## 2020-04-21 MED ORDER — SODIUM CHLORIDE 0.9 % IV SOLN: INTRAVENOUS | Status: DC

## 2020-04-21 MED ORDER — LIDOCAINE HCL (PF) 1 % IJ SOLN
INTRAMUSCULAR | Status: AC | PRN
  Administered 2020-04-21: 10 mL

## 2020-04-21 MED ORDER — FENTANYL CITRATE (PF) 100 MCG/2ML IJ SOLN
INTRAMUSCULAR | Status: AC | PRN
  Administered 2020-04-21: 50 ug via INTRAVENOUS

## 2020-04-21 MED ORDER — MIDAZOLAM HCL 2 MG/2ML IJ SOLN
INTRAMUSCULAR | Status: AC | PRN
  Administered 2020-04-21: 1 mg via INTRAVENOUS

## 2020-04-21 MED ORDER — MIDAZOLAM HCL 2 MG/2ML IJ SOLN
INTRAMUSCULAR | Status: AC
  Filled 2020-04-21: qty 4

## 2020-04-21 MED ORDER — FENTANYL CITRATE (PF) 100 MCG/2ML IJ SOLN
INTRAMUSCULAR | Status: AC
  Filled 2020-04-21: qty 2

## 2020-04-21 NOTE — Discharge Instructions (Signed)
Please call Interventional Radiology clinic [phone] with any questions or concerns.  You may remove your dressing and shower tomorrow.   Needle Biopsy, Care After These instructions tell you how to care for yourself after your procedure. Your doctor may also give you more specific instructions. Call your doctor if you have any problems or questions. What can I expect after the procedure? After the procedure, it is common to have:  Soreness.  Bruising.  Mild pain. Follow these instructions at home:   Return to your normal activities as told by your doctor. Ask your doctor what activities are safe for you.  Take over-the-counter and prescription medicines only as told by your doctor.  Wash your hands with soap and water before you change your bandage (dressing). If you cannot use soap and water, use hand sanitizer.  Follow instructions from your doctor about: ? You have a band aide on your right buttock. It can be removed on 04/22/20 around noon time. You may shower tomorrow at noon. No baths or pools until biopsy site is healed.   Check your puncture site every day for signs of infection. Watch for: ? Redness, swelling, or pain. ? Fluid or blood. ? Pus or a bad smell. ? Warmth.  Do not take baths, swim, or use a hot tub until your doctor approves.  Keep all follow-up visits as told by your doctor. This is important. Contact a doctor if you have:  A fever.  Redness, swelling, or pain at the puncture site, and it lasts longer than a few days.  Fluid, blood, or pus coming from the puncture site.  Warmth coming from the puncture site. Get help right away if:  You have a lot of bleeding from the puncture site. Summary  After the procedure, it is common to have soreness, bruising, or mild pain at the puncture site.  Check your puncture site every day for signs of infection, such as redness, swelling, or pain.  Get help right away if you have severe bleeding from  your puncture site. This information is not intended to replace advice given to you by your health care provider. Make sure you discuss any questions you have with your health care provider. Document Revised: 09/18/2017 Document Reviewed: 09/18/2017 Elsevier Patient Education  [place].    Moderate Conscious Sedation, Adult, Care After These instructions provide you with information about caring for yourself after your procedure. Your health care provider may also give you more specific instructions. Your treatment has been planned according to current medical practices, but problems sometimes occur. Call your health care provider if you have any problems or questions after your procedure. What can I expect after the procedure? After your procedure, it is common:  To feel sleepy for several hours.  To feel clumsy and have poor balance for several hours.  To have poor judgment for several hours.  To vomit if you eat too soon. Follow these instructions at home: For at least 24 hours after the procedure:   Do not: ? Participate in activities where you could fall or become injured. ? Drive. ? Use heavy machinery. ? Drink alcohol. ? Take sleeping pills or medicines that cause drowsiness. ? Make important decisions or sign legal documents. ? Take care of children on your own.  Rest. Eating and drinking  Follow the diet recommended by your health care provider.  If you vomit: ? Drink water, juice, or soup when you can drink without vomiting. ? Make sure you have  little or no nausea before eating solid foods. General instructions  Have a responsible adult stay with you until you are awake and alert.  Take over-the-counter and prescription medicines only as told by your health care provider.  If you smoke, do not smoke without supervision.  Keep all follow-up visits as told by your health care provider. This is important. Contact a health care provider if:  You  keep feeling nauseous or you keep vomiting.  You feel light-headed.  You develop a rash.  You have a fever. Get help right away if:  You have trouble breathing. This information is not intended to replace advice given to you by your health care provider. Make sure you discuss any questions you have with your health care provider. Document Revised: 08/18/2017 Document Reviewed: 12/26/2015 Elsevier Patient Education  2020 [employer].

## 2020-04-21 NOTE — Procedures (Signed)
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  Three core samples obtained and sent to Pathology.  Venetia Night. Kathlene Cote, M.D Pager:  [phone]

## 2020-04-21 NOTE — Progress Notes (Signed)
 RM 16    Patient has cardiology appt on 04/24/20    Chief Complaint   Patient presents with   . Foot Problem     Patient would like evaluation for therapeutic shoes        1. Have you been to the ER, urgent care clinic since your last visit?  Hospitalized since your last visit?No    2. Have you seen or consulted any other health care providers outside of the [facility] since your last visit?  Include any pap smears or colon screening. No    Health Maintenance Due   Topic Date Due   . Eye Exam Retinal or Dilated  Never done   . COVID-19 Vaccine (1) Never done   . DTaP/Tdap/Td series (1 - Tdap) Never done   . Colorectal Cancer Screening Combo  Never done   . Shingrix Vaccine Age 62> (1 of 2) Never done   . Breast Cancer Screen Mammogram  03/03/2016   . Medicare Yearly Exam  02/24/2018   . MICROALBUMIN Q1  02/24/2019   . Lipid Screen  10/31/2019       Abuse Screening Questionnaire 04/21/2020   Do you ever feel afraid of your partner? N   Are you in a relationship with someone who physically or mentally threatens you? N   Is it safe for you to go home? Y       3 most recent PHQ Screens 04/21/2020   Little interest or pleasure in doing things Not at all   Feeling down, depressed, irritable, or hopeless Not at all   Total Score PHQ 2 0     Fall Risk Assessment, last 12 mths 04/21/2020   Able to walk? Yes   Fall in past 12 months? 0   Do you feel unsteady? 0   Are you worried about falling 0   Number of falls in past 12 months -   Fall with injury? -       Learning Assessment 02/23/2017   PRIMARY LEARNER Patient   HIGHEST LEVEL OF EDUCATION - PRIMARY LEARNER  2 YEARS OF COLLEGE   PRIMARY LANGUAGE ENGLISH   LEARNER PREFERENCE PRIMARY DEMONSTRATION   ANSWERED BY Jori Bunker   RELATIONSHIP SELF

## 2020-04-21 NOTE — Progress Notes (Signed)
Lindsey Huynh (DOB: December 10, 1950) is a 69 y.o. female, established patient, here for evaluation of the following chief complaint(s):  Chief Complaint   Patient presents with   ??? Foot Problem     Patient would like evaluation for therapeutic shoes        Assessment and Plan:       ICD-10-CM ICD-9-CM    1. PAD (peripheral artery disease) (HCC)  I73.9 443.9 carvediloL (COREG) 3.125 mg tablet   2. 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 Encounter for completion of form with patient  Z02.89 V68.89    4. Essential hypertension  I10 401.9 carvediloL (COREG) 3.125 mg tablet   5. Type 2 diabetes with nephropathy (HCC)  E11.21 250.40 AMB POC HEMOGLOBIN A1C     583.81 carvediloL (COREG) 3.125 mg tablet       1,4,5:  Reviewed with PCP and starting BP medication reviewed.    1,2,5:  Form/documentation for therapeutic shoes reviewed with pt and PCP at visit.      Follow-up and Dispositions    ?? Return in about 3 weeks (around 05/12/2020) for Blood Pressure follow-up, Diabetes follow-up--PCP NP Ivey--& fasting labs.       lab results and schedule of future lab studies reviewed with patient  reviewed medications and side effects in detail    For additional documentation of information and/or recommendations discussed this visit, please see notes in instructions.      Plan and evaluation (above) reviewed with pt at visit  Patient voiced understanding of plan and provided with time to ask/review questions.  After Visit Summary (AVS) provided to pt after visit with additional instructions as needed/reviewed.      Future Appointments   Date Time Provider [place]   04/24/2020  9:00 AM VASCULAR, REYNOLDS CAVREY BS AMB   04/24/2020 10:00 AM Giusti, Massimo, MD [facility] AMB   --Updated future visits after patient check-out.      History of Present Illness:     Notes (nursing/rooming note copied below in italics):  Patient has cardiology appt on 04/24/20      BP Readings from Last 3 Encounters:   04/21/20 (!)  181/73   12/18/19 (!) 162/83   04/12/19 156/80     Lab Results   Component Value Date/Time    Hemoglobin A1c 8.1 (H) 11/23/2019 01:32 PM    Hemoglobin A1c (POC) 11.0 (A) 04/12/2019 09:12 AM       She is planning to schedule PCP follow-up today after visit.    She notes HGM 101 yesterday.  Had not checked today.    Rviewed and ompleted forms and updated A1c for diabetic shoes.    She has had surgery at [facility] with vascular.    DMV form for placard completed at visit also.    Nursing screenings reviewed by provider at visit.      Past Medical History:   Diagnosis Date   ??? Diabetes ([place])     insulin + metformin   ??? GERD (gastroesophageal reflux disease)    ??? Hypercholesterolemia    ??? Hypertension      Past Surgical History:   Procedure Laterality Date   ??? HX COLONOSCOPY     ??? HX ORTHOPAEDIC  06-30-10    back surgery (tumor removed)   ??? HX TUMOR REMOVAL  06/30/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        Prior to Admission medications    Medication Sig Start  Date End Date Taking? Authorizing Provider   valsartan (DIOVAN) 160 mg tablet Take  by mouth daily.   Yes Provider, Historical   insulin detemir U-100 (Levemir FlexTouch U-100 Insuln) 100 unit/mL (3 mL) inpn 12 Units by SubCUTAneous route daily (with breakfast). 12/12/19  Yes Madala, Teryl Lucy, MD   metFORMIN ER (GLUCOPHAGE XR) 500 mg tablet TAKE 2 TABLETS BY MOUTH DAILY WITH DINNER 12/06/19  Yes Ivey, Ronda Fairly T, NP   amLODIPine (NORVASC) 10 mg tablet Take 1 Tab by mouth daily. 10/15/19  Yes Bobbie Stack T, NP   gabapentin (NEURONTIN) 300 mg capsule Take 1 Cap by mouth nightly. Max Daily Amount: 300 mg. 10/15/19  Yes Bobbie Stack T, NP   Insulin Needles, Disposable, (Nano Pen Needle) 32 gauge x 5/32" ndle Use with insulin pen 3 times daily 10/15/19  Yes Bobbie Stack T, NP   cyclopentolate (CYCLOGYL) 1 % ophthalmic solution  02/19/18  Yes Provider, Historical   aspirin delayed-release 81 mg tablet Take 81 mg by mouth daily.   Yes Provider, Historical   omeprazole (PRILOSEC) 20 mg capsule Take 20 mg  by mouth daily.   Yes Provider, Historical   diclofenac (VOLTAREN) 1 % gel  04/07/20   Provider, Historical   acetaminophen (TYLENOL) 325 mg tablet Take 2 Tabs by mouth every six (6) hours as needed for Pain.  Patient not taking: Reported on 04/21/2020 12/12/19   Cheryll Cockayne, MD        ROS    Vitals:    04/21/20 1101 04/21/20 1155   BP: (!) 181/73 (!) 187/84   Pulse: 65    Resp: 14    Temp: 97.9 ??F (36.6 ??C)    TempSrc: Oral    SpO2: 98%    Weight: 156 lb 6 oz (70.9 kg)    Height: 5\' 5"  (1.651 m)    PainSc:   0 - No pain       Body mass index is 26.02 kg/m??.    Physical Exam:     Physical Exam  Vitals and nursing note reviewed.   Constitutional:       General: She is not in acute distress.     Appearance: Normal appearance. She is well-developed. She is not diaphoretic.   HENT:      Head: Normocephalic and atraumatic.   Eyes:      General: No scleral icterus.        Right eye: No discharge.         Left eye: No discharge.      Conjunctiva/sclera: Conjunctivae normal.   Cardiovascular:      Rate and Rhythm: Normal rate and regular rhythm.      Pulses: Normal pulses.      Heart sounds: Normal heart sounds. No murmur heard.   No friction rub. No gallop.    Pulmonary:      Effort: Pulmonary effort is normal. No respiratory distress.      Breath sounds: Normal breath sounds. No stridor. No wheezing, rhonchi or rales.   Abdominal:      General: Bowel sounds are normal. There is no distension.      Palpations: Abdomen is soft.      Tenderness: There is no abdominal tenderness. There is no guarding.   Musculoskeletal:         General: No swelling, tenderness, deformity or signs of injury.   Skin:     General: Skin is warm.      Coloration: Skin is not jaundiced or  pale.      Findings: No bruising, erythema or rash.   Neurological:      General: No focal deficit present.      Mental Status: She is alert.      Motor: No abnormal muscle tone.      Coordination: Coordination normal.      Gait: Gait normal.   Psychiatric:          Mood and Affect: Mood normal.         Behavior: Behavior normal.         Thought Content: Thought content normal.         Judgment: Judgment normal.       Diabetic foot exam:   Left Foot:   Visual Exam: amputation- mid-foot   Pulse DP: 2+ (normal); PT difficult to palpate due to swelling.   Filament test: normal sensation   Right Foot:   Visual Exam: normal    Pulse DP: 1+ (weak)   Filament test: normal sensation       Results for orders placed or performed in visit on 04/21/20   AMB POC HEMOGLOBIN A1C   Result Value Ref Range    Hemoglobin A1c (POC) 6.1 (A) 4.8 - 5.6 %               A1c              eAg  6.1 128   6.2 131   6.3 134   6.4 137   6.5 140   6.6 143   6.7 146   6.8 148   6.9 151   7.0 154           An electronic signature was used to authenticate this note.  -- Evette Doffing, MD

## 2020-04-22 ENCOUNTER — Telehealth: Payer: Self-pay | Admitting: *Deleted

## 2020-04-22 NOTE — Telephone Encounter (Signed)
[name] patient to advise of appt change with [facility]. Spoke with patient and informed her that her appt has been moved from 05/12/20 to 04/23/20 @ 3pm with Dr. Alphonsa Overall. She voiced understanding. And expressed that she is very glad it was moved up.  Dr. Lorenso Courier made aware.

## 2020-04-22 NOTE — Telephone Encounter (Signed)
Paperwork for shoes were completed and faxed to [facility] [phone]. DMV form completed and faxed to [phone]. Confirmation received. Document placed in bin for centralized scanning.

## 2020-04-23 DIAGNOSIS — R1900 Intra-abdominal and pelvic swelling, mass and lump, unspecified site: Secondary | ICD-10-CM | POA: Diagnosis not present

## 2020-04-23 DIAGNOSIS — C8590 Non-Hodgkin lymphoma, unspecified, unspecified site: Secondary | ICD-10-CM | POA: Diagnosis not present

## 2020-04-23 NOTE — Telephone Encounter (Signed)
TC to pt, ID verified. Advised pt her Echocardiogram was still pending with her insurance and r/s it to next week. Pt states she is feeling well and has been following up for prosthetics for her foot. She was put on blood pressure medication with Dr. Roda Shutters and goes back to see him in 3 weeks for a check. Advised she call if she needs anything before her follow up (Scheduled for 05/28/20). Pt understands, no further questions.

## 2020-04-23 NOTE — Progress Notes (Signed)
[facility] called requesting 04/14/2020 or 04/21/2020 progress notes. The visit needs to have a concern for diabetic shoes. Faxed to [phone]

## 2020-04-23 NOTE — Telephone Encounter (Signed)
Good Afternoon    The Echo Dr. Shelly Rubenstein ordered is still PENDING.     Please call the patient to r/s this appt    [facility]

## 2020-04-24 ENCOUNTER — Encounter: Payer: Medicare (Managed Care) | Attending: Specialist | Primary: Family

## 2020-04-24 ENCOUNTER — Ambulatory Visit: Payer: Medicare (Managed Care) | Primary: Family

## 2020-04-24 ENCOUNTER — Other Ambulatory Visit: Payer: Self-pay

## 2020-04-24 ENCOUNTER — Telehealth: Payer: Self-pay | Admitting: Hematology and Oncology

## 2020-04-24 ENCOUNTER — Ambulatory Visit (HOSPITAL_COMMUNITY)
Admission: RE | Admit: 2020-04-24 | Discharge: 2020-04-24 | Disposition: A | Discharge status: Home / Self Care | Payer: Medicare Other | Source: Ambulatory Visit | Attending: Gynecologic Oncology | Admitting: Gynecologic Oncology

## 2020-04-24 DIAGNOSIS — N858 Other specified noninflammatory disorders of uterus: Secondary | ICD-10-CM | POA: Diagnosis not present

## 2020-04-24 DIAGNOSIS — R948 Abnormal results of function studies of other organs and systems: Secondary | ICD-10-CM | POA: Insufficient documentation

## 2020-04-24 DIAGNOSIS — R590 Localized enlarged lymph nodes: Secondary | ICD-10-CM | POA: Diagnosis not present

## 2020-04-24 DIAGNOSIS — D252 Subserosal leiomyoma of uterus: Secondary | ICD-10-CM | POA: Diagnosis not present

## 2020-04-24 DIAGNOSIS — N898 Other specified noninflammatory disorders of vagina: Secondary | ICD-10-CM | POA: Diagnosis not present

## 2020-04-24 LAB — SURGICAL PATHOLOGY

## 2020-04-24 MED ORDER — GADOBUTROL 1 MMOL/ML IV SOLN
7.0000 mL | Freq: Once | INTRAVENOUS | Status: AC | PRN
  Administered 2020-04-24: 7 mL via INTRAVENOUS

## 2020-04-24 NOTE — Telephone Encounter (Signed)
Scheduled appt per 8/6 sch msg - left message with appt date and time  Unable to reach pt .

## 2020-04-29 ENCOUNTER — Inpatient Hospital Stay: Payer: Medicare Other

## 2020-04-29 ENCOUNTER — Encounter: Payer: Self-pay | Admitting: Hematology and Oncology

## 2020-04-29 ENCOUNTER — Inpatient Hospital Stay: Payer: Medicare Other | Attending: Hematology and Oncology | Admitting: Hematology and Oncology

## 2020-04-29 ENCOUNTER — Other Ambulatory Visit: Payer: Self-pay

## 2020-04-29 ENCOUNTER — Other Ambulatory Visit: Payer: Self-pay | Admitting: Hematology and Oncology

## 2020-04-29 VITALS — BP 115/70 | HR 68 | Temp 96.2°F | Resp 17 | Ht 64.0 in | Wt 159.0 lb

## 2020-04-29 DIAGNOSIS — E559 Vitamin D deficiency, unspecified: Secondary | ICD-10-CM | POA: Insufficient documentation

## 2020-04-29 DIAGNOSIS — C8511 Unspecified B-cell lymphoma, lymph nodes of head, face, and neck: Secondary | ICD-10-CM | POA: Diagnosis not present

## 2020-04-29 DIAGNOSIS — Z79899 Other long term (current) drug therapy: Secondary | ICD-10-CM | POA: Diagnosis not present

## 2020-04-29 DIAGNOSIS — E785 Hyperlipidemia, unspecified: Secondary | ICD-10-CM | POA: Insufficient documentation

## 2020-04-29 DIAGNOSIS — K219 Gastro-esophageal reflux disease without esophagitis: Secondary | ICD-10-CM | POA: Diagnosis not present

## 2020-04-29 DIAGNOSIS — C8253 Diffuse follicle center lymphoma, intra-abdominal lymph nodes: Secondary | ICD-10-CM | POA: Diagnosis not present

## 2020-04-29 DIAGNOSIS — Z7982 Long term (current) use of aspirin: Secondary | ICD-10-CM | POA: Diagnosis not present

## 2020-04-29 DIAGNOSIS — F419 Anxiety disorder, unspecified: Secondary | ICD-10-CM | POA: Insufficient documentation

## 2020-04-29 DIAGNOSIS — R590 Localized enlarged lymph nodes: Secondary | ICD-10-CM | POA: Diagnosis not present

## 2020-04-29 DIAGNOSIS — C8261 Cutaneous follicle center lymphoma, lymph nodes of head, face, and neck: Secondary | ICD-10-CM

## 2020-04-29 DIAGNOSIS — J301 Allergic rhinitis due to pollen: Secondary | ICD-10-CM | POA: Diagnosis not present

## 2020-04-29 DIAGNOSIS — F329 Major depressive disorder, single episode, unspecified: Secondary | ICD-10-CM | POA: Insufficient documentation

## 2020-04-29 DIAGNOSIS — J45909 Unspecified asthma, uncomplicated: Secondary | ICD-10-CM | POA: Diagnosis not present

## 2020-04-29 DIAGNOSIS — J3089 Other allergic rhinitis: Secondary | ICD-10-CM | POA: Diagnosis not present

## 2020-04-29 LAB — CMP (CANCER CENTER ONLY)
ALT: 16 U/L (ref 0–44)
AST: 20 U/L (ref 15–41)
Albumin: 3.4 g/dL — ABNORMAL LOW (ref 3.5–5.0)
Alkaline Phosphatase: 110 U/L (ref 38–126)
Anion gap: 9 (ref 5–15)
BUN: 11 mg/dL (ref 8–23)
CO2: 26 mmol/L (ref 22–32)
Calcium: 9.4 mg/dL (ref 8.9–10.3)
Chloride: 109 mmol/L (ref 98–111)
Creatinine: 0.78 mg/dL (ref 0.44–1.00)
GFR, Est AFR Am: >60 mL/min (ref >60)
GFR, Estimated: >60 mL/min (ref >60)
Glucose, Bld: 98 mg/dL (ref 70–99)
Potassium: 4.2 mmol/L (ref 3.5–5.1)
Sodium: 144 mmol/L (ref 135–145)
Total Bilirubin: 0.5 mg/dL (ref 0.3–1.2)
Total Protein: 6.5 g/dL (ref 6.5–8.1)

## 2020-04-29 LAB — CBC WITH DIFFERENTIAL (CANCER CENTER ONLY)
Abs Immature Granulocytes: 0.01 10*3/uL (ref 0.00–0.07)
Basophils Absolute: 0.1 10*3/uL (ref 0.0–0.1)
Basophils Relative: 2 %
Eosinophils Absolute: 0.5 10*3/uL (ref 0.0–0.5)
Eosinophils Relative: 10 %
HCT: 44.9 % (ref 36.0–46.0)
Hemoglobin: 14.7 g/dL (ref 12.0–15.0)
Immature Granulocytes: 0 %
Lymphocytes Relative: 29 %
Lymphs Abs: 1.5 10*3/uL (ref 0.7–4.0)
MCH: 30.6 pg (ref 26.0–34.0)
MCHC: 32.7 g/dL (ref 30.0–36.0)
MCV: 93.3 fL (ref 80.0–100.0)
Monocytes Absolute: 0.5 10*3/uL (ref 0.1–1.0)
Monocytes Relative: 9 %
Neutro Abs: 2.5 10*3/uL (ref 1.7–7.7)
Neutrophils Relative %: 50 %
Platelet Count: 236 10*3/uL (ref 150–400)
RBC: 4.81 MIL/uL (ref 3.87–5.11)
RDW: 12.8 % (ref 11.5–15.5)
WBC Count: 5.1 10*3/uL (ref 4.0–10.5)
nRBC: 0 % (ref 0.0–0.2)

## 2020-04-29 LAB — LACTATE DEHYDROGENASE: LDH: 173 U/L (ref 98–192)

## 2020-04-29 NOTE — Progress Notes (Signed)
[place] [phone]   [phone]  PROGRESS NOTE  Patient Care Team: Prince Solian, MD as PCP - General (Internal Medicine) Mcarthur Rossetti, MD as Consulting Physician (Orthopedic Surgery)  Hematological/Oncological History # Non-Hodgkin B Cell Lymphoma, Follicular Lymphoma. Stage III 1) 02/25/2020: patient underwent excision of a 1.3 cm mobile nodule on the left vertex of the scalp. 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  2) 03/18/2020: Establish care with Dr. Lorenso Courier  3) 03/30/2020: PET CT scan showed  involvement of the left scalp, left axillary lymph node, left hilar and infrahilar nodes, subcarinal node, and mesenteric lymph nodes, as well as a masslike appearance/ high metabolic activity in the lower uterus/cervix.  4) 04/21/2020: biopsy of the mass in the lower uterus/cervix consistent with follicular lymphoma.   Interval History:  Alyssa Elliott 69 y.o. female with medical history significant for diffuse non Hodgkin B cell lymphoma who presents for a follow up visit. The patient's last visit was on 04/01/2020 at which time she established care. In the interim since the last visit the patient had a biopsy of the masslike appearance/ high metabolic activity in the lower uterus/cervix which revealed follicular lymphoma. The axillary lymph node biopsy was cancelled.   On exam today Alyssa Elliott is accompanied by her husband.  She reports that the knot on her head may be enlarging, but otherwise she is not having any issues with fevers, chills, sweats, nausea, running or diarrhea.  She notes that the lesion on top of her head is not causing any pain or discomfort but that she does frequently touch it and rub it and therefore it does occasionally get sore.  She reports that her biopsy went well and that she did not have any concerns or complaints following the procedure.  The  bulk of our discussion today focused on the diagnosis and the treatment options moving forward.  Details of this conversation are in the assessment.  A full 10 point ROS is listed below.  MEDICAL HISTORY:  Past Medical History:  Diagnosis Date  . Allergy   . Anxiety   . Asthma   . Cataract 2018   removed  . Complication of anesthesia    drop in BP  . DDD (degenerative disc disease)   . Depression   . GERD (gastroesophageal reflux disease)   . Hemorrhoids   . Hyperlipidemia   . Lymphoma ([place]) dx 2021   no tx for now  . Vitamin D deficiency     SURGICAL HISTORY: Past Surgical History:  Procedure Laterality Date  . ATRIAL ABLATION SURGERY  1997   SVT  . cataract surgery Bilateral 2017  . COLONOSCOPY  2016  . FOOT NEUROMA SURGERY     multiple times bilaterally  . PLANTAR FASCIA RELEASE Left 1999 or 2000  . SHOULDER ARTHROSCOPY Left 04/19/2019   Procedure: LEFT SHOULDER MANIPULATION UNDER ANESTHESIA AND ARTHROSCOPY WITH EXTENSIVE DEBRIDEMENT;  Surgeon: Mcarthur Rossetti, MD;  Location: [facility];  Service: Orthopedics;  Laterality: Left;  . UPPER GI ENDOSCOPY  yrs ago    SOCIAL HISTORY: Social History   Socioeconomic History  . Marital status: Married    Spouse name: Not on file  . Number of children: 0  . Years of education: Not on file  . Highest education level: Not on file  Occupational History  . Occupation: retired Catering manager  Tobacco Use  . Smoking status: Never Smoker  . Smokeless tobacco: Never Used  Vaping Use  . Vaping Use: Never used  Substance and Sexual Activity  . Alcohol use: No  . Drug use: No  . Sexual activity: Not Currently    Partners: Male    Birth control/protection: Post-menopausal  Other Topics Concern  . Not on file  Social History Narrative  . Not on file   Social Determinants of Health   Financial Resource Strain:   . Difficulty of Paying Living Expenses:   Food Insecurity:   . Worried About Charity fundraiser in the  Last Year:   . Arboriculturist in the Last Year:   Transportation Needs:   . Film/video editor (Medical):   Marland Kitchen Lack of Transportation (Non-Medical):   Physical Activity:   . Days of Exercise per Week:   . Minutes of Exercise per Session:   Stress:   . Feeling of Stress :   Social Connections:   . Frequency of Communication with Friends and Family:   . Frequency of Social Gatherings with Friends and Family:   . Attends Religious Services:   . Active Member of Clubs or Organizations:   . Attends Archivist Meetings:   Marland Kitchen Marital Status:   Intimate Partner Violence:   . Fear of Current or Ex-Partner:   . Emotionally Abused:   Marland Kitchen Physically Abused:   . Sexually Abused:     FAMILY HISTORY: Family History  Problem Relation Age of Onset  . Heart disease Mother   . Diabetes Mother   . Tuberculosis Paternal Grandfather   . Bone cancer Paternal Grandfather   . Breast cancer Sister 53  . Melanoma Sister   . Cirrhosis Maternal Grandfather   . Heart failure Paternal Grandmother   . Colon cancer Neg Hx   . Ovarian cancer Neg Hx   . Uterine cancer Neg Hx     ALLERGIES:  is allergic to codeine, penicillins, latex, and sulfa antibiotics.  MEDICATIONS:  Current Outpatient Medications  Medication Sig Dispense Refill  . acetaminophen (TYLENOL) 500 MG tablet Take 1,000 mg by mouth every 6 (six) hours as needed for moderate pain or headache.    . ARIPiprazole (ABILIFY) 2 MG tablet Take 2 mg by mouth daily.     Marland Kitchen aspirin 81 MG tablet Take 81 mg by mouth at bedtime.     . [employer] FIBER GUMMIES PO Take 2 each by mouth daily.     Marland Kitchen EPINEPHrine 0.3 mg/0.3 mL IJ SOAJ injection Inject 0.3 mg into the muscle as needed for anaphylaxis.     . fluticasone furoate-vilanterol (BREO ELLIPTA) 200-25 MCG/INH AEPB Inhale 1 puff into the lungs daily.    Marland Kitchen GLUCOSAMINE-CHONDROITIN-MSM PO Take 1 tablet by mouth 2 (two) times a day.    . levalbuterol (XOPENEX HFA) 45 MCG/ACT inhaler Inhale 1 puff  into the lungs every 4 (four) hours as needed for wheezing or shortness of breath. 1 Inhaler 3  . Melatonin 5 MG TABS Take 20 mg by mouth at bedtime.     . meloxicam (MOBIC) 15 MG tablet Take 15 mg by mouth daily as needed for pain.  (Patient not taking: Reported on 04/03/2020)    . NONFORMULARY OR COMPOUNDED ITEM Estradiol 0.02% Insert 1 gram vaginally 2 times per week. 3 month supply with 3 refills. (Patient not taking: Reported on 04/03/2020) 3 each 3  . nystatin cream (MYCOSTATIN) Apply 1 application topically 2 (two) times daily. Apply to affected area BID for up to 7 days. 30 g 0  .  pantoprazole (PROTONIX) 40 MG tablet Take 40 mg by mouth 2 (two) times daily.    Marland Kitchen PARoxetine (PAXIL) 40 MG tablet Take 40 mg by mouth daily.     . pravastatin (PRAVACHOL) 40 MG tablet Take 40 mg by mouth at bedtime.     Marland Kitchen SINGULAIR 10 MG tablet Take 10 mg by mouth at bedtime.     . Vitamin D, Ergocalciferol, (DRISDOL) 50000 UNITS CAPS Take 50,000 Units by mouth every Monday.      No current facility-administered medications for this visit.    REVIEW OF SYSTEMS:   Constitutional: ( - ) fevers, ( - )  chills , ( - ) night sweats Eyes: ( - ) blurriness of vision, ( - ) double vision, ( - ) watery eyes Ears, nose, mouth, throat, and face: ( - ) mucositis, ( - ) sore throat Respiratory: ( - ) cough, ( - ) dyspnea, ( - ) wheezes Cardiovascular: ( - ) palpitation, ( - ) chest discomfort, ( - ) lower extremity swelling Gastrointestinal:  ( - ) nausea, ( - ) heartburn, ( - ) change in bowel habits Skin: ( - ) abnormal skin rashes Lymphatics: ( - ) new lymphadenopathy, ( - ) easy bruising Neurological: ( - ) numbness, ( - ) tingling, ( - ) new weaknesses Behavioral/Psych: ( - ) mood change, ( - ) new changes  All other systems were reviewed with the patient and are negative.  PHYSICAL EXAMINATION: ECOG PERFORMANCE STATUS: 1 - Symptomatic but completely ambulatory  Vitals:   04/29/20 0806  BP: 115/70  Pulse: 68   Resp: 17  Temp: (!) 96.2 F (35.7 C)  SpO2: 96%   Filed Weights   04/29/20 0806  Weight: 159 lb (72.1 kg)    GENERAL: well appearing elderly Caucasian female. alert, no distress and comfortable SKIN: scalp lesion appears to be growing, increased in size from last visit. skin color, texture, turgor are normal, no rashes or significant lesions EYES: conjunctiva are pink and non-injected, sclera clear LYMPH:  no palpable lymphadenopathy in the cervical, axillary or supraclavicular LUNGS: clear to auscultation and percussion with normal breathing effort HEART: regular rate & rhythm and no murmurs and no lower extremity edema Musculoskeletal: no cyanosis of digits and no clubbing  PSYCH: alert & oriented x 3, fluent speech NEURO: no focal motor/sensory deficits  LABORATORY DATA:  I have reviewed the data as listed CBC Latest Ref Rng & Units 04/29/2020 04/21/2020 04/06/2020  WBC 4.0 - 10.5 K/uL 5.1 5.7 5.4  Hemoglobin 12.0 - 15.0 g/dL 14.7 14.6 15.1(H)  Hematocrit 36 - 46 % 44.9 45.0 46.9(H)  Platelets 150 - 400 K/uL 236 222 222    CMP Latest Ref Rng & Units 04/29/2020 04/06/2020 03/18/2020  Glucose 70 - 99 mg/dL 98 104(H) 91  BUN 8 - 23 mg/dL '11 9 10  ' Creatinine 0.44 - 1.00 mg/dL 0.78 0.81 0.85  Sodium 135 - 145 mmol/L 144 143 143  Potassium 3.5 - 5.1 mmol/L 4.2 3.9 4.0  Chloride 98 - 111 mmol/L 109 106 107  CO2 22 - 32 mmol/L '26 27 26  ' Calcium 8.9 - 10.3 mg/dL 9.4 9.1 9.2  Total Protein 6.5 - 8.1 g/dL 6.5 - 7.1  Total Bilirubin 0.3 - 1.2 mg/dL 0.5 - 0.4  Alkaline Phos 38 - 126 U/L 110 - 119  AST 15 - 41 U/L 20 - 21  ALT 0 - 44 U/L 16 - 17    No results found for: MPROTEIN  No results found for: KPAFRELGTCHN, LAMBDASER, KAPLAMBRATIO   RADIOGRAPHIC STUDIES: I have personally reviewed the radiological images as listed and agreed with the findings in the report: FDG avid lesion on left scalp, left axilla, mediastinum, and abdominal lymph nodes. Uterus and cervix light up brightly  on this scan.   MR Pelvis W Wo Contrast  Result Date: 04/24/2020 CLINICAL DATA:  69 year old postmenopausal female with pelvic pain and intensely hypermetabolic pelvic mass posterior to the urinary bladder on recent PET-CT performed for follicular lymphoma of the scalp. CT-guided percutaneous biopsy of this pelvic mass on 04/21/2020 demonstrated non-Hodgkin B-cell lymphoma. EXAM: MRI PELVIS WITHOUT AND WITH CONTRAST TECHNIQUE: Multiplanar multisequence MR imaging of the pelvis was performed both before and after administration of intravenous contrast. CONTRAST:  69m GADAVIST GADOBUTROL 1 MMOL/ML IV SOLN COMPARISON:  03/30/2020 PET-CT. FINDINGS: Urinary Tract:  Normal bladder.  Normal urethra. Bowel: Visualized small and large bowel are normal caliber with no bowel wall thickening. Vascular/Lymphatic: No pathologically enlarged lymph nodes in the pelvis. Mildly enlarged 1.0 cm central left mesenteric lymph node (series 15/image 14), correlating with the FDG avid node in this location on recent PET-CT. No acute vascular abnormality in the pelvis. Reproductive: Uterus: The atrophic anteverted uterus measures 5.3 x 1.7 x 3.1 cm. There is a tiny 0.7 cm subserosal left upper uterine body fibroid. Endometrium measures 1 mm in bilayer thickness, which is normal. No endometrial cavity fluid or focal endometrial mass. Normal uterine cervix. There is a mildly lobulated 6.9 x 3.1 x 5.4 cm avidly enhancing soft tissue mass replacing the entire vagina excluding the introitus. Ovaries and Adnexa: The ovaries are atrophic bilaterally. No ovarian or adnexal masses. Other: No abnormal free fluid in the pelvis. No focal pelvic fluid collection. Musculoskeletal: No aggressive appearing focal osseous lesions. IMPRESSION: 1. Avidly enhancing 6.9 x 3.1 x 5.4 cm mildly lobulated soft tissue mass replacing the entire vagina excluding the introitus. Findings are compatible with biopsy-proven lymphoma of the vagina. The uterus is atrophic  and spared. 2. No pelvic adenopathy. Mildly enlarged central left mesenteric lymph node, correlating with the FDG avid node in this location on recent PET-CT, compatible with lymphoma. Electronically Signed   By: JIlona SorrelM.D.   On: 04/24/2020 18:17   NM PET Image Initial (PI) Whole Body  Result Date: 03/30/2020 CLINICAL DATA:  Initial treatment strategy for cutaneous lymphoma of the head. EXAM: NUCLEAR MEDICINE PET WHOLE BODY TECHNIQUE: 8.0 mCi F-18 FDG was injected intravenously. Full-ring PET imaging was performed from the skull base to thigh after the radiotracer. CT data was obtained and used for attenuation correction and anatomic localization. Fasting blood glucose: 102 mg/dl COMPARISON:  CT chest 11/17/2018 and CT pelvis from 11/28/2016 FINDINGS: Mediastinal blood pool activity: SUV max 2.8 Hepatic activity: Representative SUV max 4.4 HEAD/NECK: Cutaneous mass along the left vertex measuring 2.0 by 1.5 cm on image 11/4 has a maximum SUV of 14.3, compatible with Deauville 5 activity. No other pathologically enlarged or hypermetabolic nodal activity in the neck. Incidental CT findings: none CHEST: Left axillary node 1.2 cm in short axis on image 86/4, maximum SUV 13.5, compatible with Deauville 5 activity. Small left hilar and infrahilar lymph nodes are observed with a maximum SUV of 4.6 compatible with Deauville 4 activity. Subcarinal node measuring 0.8 cm in short axis on image 95/4 maximum SUV 4.4, compatible with Deauville 3 activity. Right axillary lymph node 0.5 cm in short axis on image 86/4, maximum SUV 2.4 compatible with Deauville 2 activity. Incidental  CT findings: Mild branch vessel atherosclerotic vascular disease. ABDOMEN/PELVIS: Left eccentric mass mesenteric lymph node adjacent to the transverse duodenum measures 1.0 cm in diameter and has a maximum SUV of 5.8 (image 50/4). This is compatible with Deauville 4 disease. Other smaller mesenteric lymph nodes have a less degree of activity  no hypermetabolic splenic activity or splenomegaly. There a masslike and hypermetabolic appearance of the lower uterus and cervix. Cervical cancer not excluded. The cervical region measures approximately 5.7 by 3.4 cm on image 198/4. Maximum SUV in this vicinity 17.9 (Deauville 5 level of activity). Incidental CT findings: Aortoiliac atherosclerotic vascular disease. SKELETON: No significant abnormal hypermetabolic activity in this region. Incidental CT findings: Osteochondral lesion along the posterior patellar apex on the right. EXTREMITIES: No significant abnormal hypermetabolic activity in this region. Incidental CT findings: none IMPRESSION: 1. Deauville 5 activity in a cutaneous mass along the left scalp vertex. 2. Deauville 5 activity in a left axillary lymph node. 3. Deauville 4 activity in left hilar and infrahilar lymph nodes with Deauville 3 activity in a subcarinal lymph node. Deauville 4 activity in mildly enlarged mesenteric lymph node. 4. Masslike appearance and high metabolic activity in the lower uterus and cervix. Cervical cancer or endometrial cancer not excluded. Maximum SUV in this vicinity 17.9. 5.  Aortic Atherosclerosis (ICD10-I70.0). Deauville Scale: Electronically Signed   By: Van Clines M.D.   On: 03/30/2020 15:18   CT BIOPSY  Result Date: 04/21/2020 CLINICAL DATA:  Recent diagnosis of cutaneous follicular lymphoma of the scalp. PET scan demonstrates multiple areas of hypermetabolic lymph nodes as well as a focal hypermetabolic pelvic mass measuring roughly 3.5 x 5.5 cm and abutting or originating from the lower uterine area. Gynecologic exam and biopsies have been negative for malignancy and request has been made to perform percutaneous biopsy of the pelvic mass. EXAM: CT GUIDED CORE BIOPSY OF PELVIC MASS ANESTHESIA/SEDATION: 2.0 mg IV Versed; 100 mcg IV Fentanyl Total Moderate Sedation Time:  22 minutes. The patient's level of consciousness and physiologic status were  continuously monitored during the procedure by Radiology nursing. PROCEDURE: The procedure risks, benefits, and alternatives were explained to the patient. Questions regarding the procedure were encouraged and answered. The patient understands and consents to the procedure. A time-out was performed prior to initiating the procedure. CT was performed of the pelvis in a prone position. The right gluteal region was prepped with chlorhexidine in a sterile fashion, and a sterile drape was applied covering the operative field. A sterile gown and sterile gloves were used for the procedure. Local anesthesia was provided with 1% Lidocaine. From a right transgluteal approach, a 17 gauge trocar needle was advanced under real-time CT guidance to the level of a posterior pelvic mass. After confirming needle tip position, coaxial 18 gauge core biopsy samples were obtained. Three separate core biopsy samples were submitted in saline. Gel-Foam pledgets were advanced through the outer needle as the needle was retracted and removed. Additional CT was performed. COMPLICATIONS: None FINDINGS: Lobulated soft tissue mass is again noted involving the region of the lower uterine segment in the posterior pelvis. The right side of the mass is slightly more bulky compared to the left and was targeted for sampling. Solid tissue was obtained. There were no immediate complications. IMPRESSION: CT-guided core biopsy performed of pelvic mass either emanating from, or involving, the uterus. Solid tissue was obtained. Electronically Signed   By: [name] M.D.   On: 04/21/2020 11:25   MM 3D SCREEN BREAST BILATERAL  Result  Date: 04/15/2020 CLINICAL DATA:  Screening. EXAM: DIGITAL SCREENING BILATERAL MAMMOGRAM WITH TOMO AND CAD COMPARISON:  Previous exam(s). ACR Breast Density Category b: There are scattered areas of fibroglandular density. FINDINGS: There are no findings suspicious for malignancy. Images were processed with CAD. IMPRESSION:  No mammographic evidence of malignancy. A result letter of this screening mammogram will be mailed directly to the patient. RECOMMENDATION: Screening mammogram in one year. (Code:SM-B-01Y) BI-RADS CATEGORY  1: Negative. Electronically Signed   By: Lillia Mountain M.D.   On: 04/15/2020 08:07    ASSESSMENT & PLAN Alyssa Elliott 69 y.o. female with medical history significant for diffuse non Hodgkin B cell lymphoma who presents for a follow up visit.  After review of the imaging, review the labs, discussion with the patient her findings are most consistent with a diffuse non-Hodgkin B-cell lymphoma, follicular lymphoma.  The initial diagnosis based off the biopsy from the lesion on the head was that of a follicle cell lymphoma which tends to be a cutaneous lymphoma, however this diagnosis was considerably less likely given the diffuse spread throughout the lymph nodes of the chest and abdomen. Biopsy of the FDG avid lesion in the pelvis resulted with follicular lymphoma.   On exam today Alyssa Elliott is accompanied by her husband.  We discussed the diagnosis of follicular lymphoma and the options moving forward.  She currently does not meet any GELF Criteria and therefore is not required to start therapy.  We discussed that treatment could be started if she so desired and we discussed the risks and benefits of bendamustine rituximab therapy.  She voiced her understanding of the different options between starting treatment and continued observation.  After discussion with her husband she noted she would like to enter observation at this time, but would give [place] a call back if she were to reconsider.  I think this is a reasonable option.  We will order a CT scan in approximately 2 months time which would be 3 months from her last PET CT scan.  In the interim if she were to change her mind and want to start therapy would be happy to see her back earlier.  GELF Criteria: none met  # Non-Hodgkin B Cell Lymphoma,  Follicular Lymphoma. Stage III --findings show lymphadenopathy on both sides of the diaphragm. Consistent with at least a Stage III lymphoma -- confirmed the diagnosis with biopsy of the FDG avid mass near the uterus/cervix. Cancel the left axillary lymph node biopsy --based on the GELF criteria (as a follicular lymphoma) there is no clear indication for at this time for treatment.  --discussed the PET findings, biopsy results, and steps moving forward with the patient.  --offered radiation to the scalp lesion for cosmetic purposes and to prevent further symptoms from this lesion. Patient noted she would hold as the head lesion is minimally symptomatic.  --RTC in 2 months with interval CT scan (CT scan 3 months from last PET CT)  #FDG avidity of Uterus and Cervix --confirmed to be follicular lymphoma by biopsy. Discussion as above.    No orders of the defined types were placed in this encounter.  All questions were answered. The patient knows to call the clinic with any problems, questions or concerns.  A total of more than 40 minutes were spent on this encounter and over half of that time was spent on counseling and coordination of care as outlined above.   Ledell Peoples, MD Department of Hematology/Oncology [facility] at  [facility] Phone: [phone] Pager: [phone] Email: [email or web address]  04/29/2020 1:11 PM

## 2020-04-30 ENCOUNTER — Ambulatory Visit: Primary: Family

## 2020-04-30 ENCOUNTER — Telehealth: Payer: Self-pay | Admitting: Hematology and Oncology

## 2020-04-30 NOTE — Telephone Encounter (Signed)
Scheduled per los. Called and spoke with patient. Confirmed appt 

## 2020-05-06 ENCOUNTER — Other Ambulatory Visit: Payer: Self-pay | Admitting: Surgery

## 2020-05-06 DIAGNOSIS — C8594 Non-Hodgkin lymphoma, unspecified, lymph nodes of axilla and upper limb: Secondary | ICD-10-CM

## 2020-05-08 DIAGNOSIS — J301 Allergic rhinitis due to pollen: Secondary | ICD-10-CM | POA: Diagnosis not present

## 2020-05-08 DIAGNOSIS — J3081 Allergic rhinitis due to animal (cat) (dog) hair and dander: Secondary | ICD-10-CM | POA: Diagnosis not present

## 2020-05-08 DIAGNOSIS — J3089 Other allergic rhinitis: Secondary | ICD-10-CM | POA: Diagnosis not present

## 2020-05-12 DIAGNOSIS — J3089 Other allergic rhinitis: Secondary | ICD-10-CM | POA: Diagnosis not present

## 2020-05-12 DIAGNOSIS — J301 Allergic rhinitis due to pollen: Secondary | ICD-10-CM | POA: Diagnosis not present

## 2020-05-15 ENCOUNTER — Ambulatory Visit: Payer: Medicare (Managed Care) | Primary: Family

## 2020-05-15 DIAGNOSIS — J301 Allergic rhinitis due to pollen: Secondary | ICD-10-CM | POA: Diagnosis not present

## 2020-05-15 DIAGNOSIS — J3089 Other allergic rhinitis: Secondary | ICD-10-CM | POA: Diagnosis not present

## 2020-05-21 ENCOUNTER — Ambulatory Visit: Payer: Medicare Other | Admitting: Hematology and Oncology

## 2020-05-21 ENCOUNTER — Other Ambulatory Visit: Payer: Medicare Other

## 2020-05-26 DIAGNOSIS — J301 Allergic rhinitis due to pollen: Secondary | ICD-10-CM | POA: Diagnosis not present

## 2020-05-26 DIAGNOSIS — J3089 Other allergic rhinitis: Secondary | ICD-10-CM | POA: Diagnosis not present

## 2020-05-27 ENCOUNTER — Encounter: Attending: Family | Primary: Family

## 2020-05-28 ENCOUNTER — Encounter: Attending: Specialist | Primary: Family

## 2020-06-02 DIAGNOSIS — J301 Allergic rhinitis due to pollen: Secondary | ICD-10-CM | POA: Diagnosis not present

## 2020-06-02 DIAGNOSIS — J3089 Other allergic rhinitis: Secondary | ICD-10-CM | POA: Diagnosis not present

## 2020-06-08 DIAGNOSIS — H26493 Other secondary cataract, bilateral: Secondary | ICD-10-CM | POA: Diagnosis not present

## 2020-06-08 DIAGNOSIS — H5212 Myopia, left eye: Secondary | ICD-10-CM | POA: Diagnosis not present

## 2020-06-08 DIAGNOSIS — H5201 Hypermetropia, right eye: Secondary | ICD-10-CM | POA: Diagnosis not present

## 2020-06-08 DIAGNOSIS — H04201 Unspecified epiphora, right lacrimal gland: Secondary | ICD-10-CM | POA: Diagnosis not present

## 2020-06-08 DIAGNOSIS — H04551 Acquired stenosis of right nasolacrimal duct: Secondary | ICD-10-CM | POA: Diagnosis not present

## 2020-06-08 DIAGNOSIS — H52223 Regular astigmatism, bilateral: Secondary | ICD-10-CM | POA: Diagnosis not present

## 2020-06-08 DIAGNOSIS — Z9849 Cataract extraction status, unspecified eye: Secondary | ICD-10-CM | POA: Diagnosis not present

## 2020-06-08 DIAGNOSIS — H524 Presbyopia: Secondary | ICD-10-CM | POA: Diagnosis not present

## 2020-06-12 ENCOUNTER — Ambulatory Visit: Payer: Medicare (Managed Care) | Primary: Family

## 2020-06-16 DIAGNOSIS — J301 Allergic rhinitis due to pollen: Secondary | ICD-10-CM | POA: Diagnosis not present

## 2020-06-16 DIAGNOSIS — J3089 Other allergic rhinitis: Secondary | ICD-10-CM | POA: Diagnosis not present

## 2020-06-18 DIAGNOSIS — D2330 Other benign neoplasm of skin of unspecified part of face: Secondary | ICD-10-CM | POA: Diagnosis not present

## 2020-06-18 DIAGNOSIS — D225 Melanocytic nevi of trunk: Secondary | ICD-10-CM | POA: Diagnosis not present

## 2020-06-18 DIAGNOSIS — D2371 Other benign neoplasm of skin of right lower limb, including hip: Secondary | ICD-10-CM | POA: Diagnosis not present

## 2020-06-18 DIAGNOSIS — L821 Other seborrheic keratosis: Secondary | ICD-10-CM | POA: Diagnosis not present

## 2020-06-18 DIAGNOSIS — Z808 Family history of malignant neoplasm of other organs or systems: Secondary | ICD-10-CM | POA: Diagnosis not present

## 2020-06-18 DIAGNOSIS — D2272 Melanocytic nevi of left lower limb, including hip: Secondary | ICD-10-CM | POA: Diagnosis not present

## 2020-06-18 DIAGNOSIS — Z23 Encounter for immunization: Secondary | ICD-10-CM | POA: Diagnosis not present

## 2020-06-18 DIAGNOSIS — L578 Other skin changes due to chronic exposure to nonionizing radiation: Secondary | ICD-10-CM | POA: Diagnosis not present

## 2020-06-18 DIAGNOSIS — C8518 Unspecified B-cell lymphoma, lymph nodes of multiple sites: Secondary | ICD-10-CM | POA: Diagnosis not present

## 2020-06-25 ENCOUNTER — Other Ambulatory Visit: Payer: Self-pay | Admitting: Hematology and Oncology

## 2020-06-25 ENCOUNTER — Other Ambulatory Visit: Payer: Self-pay

## 2020-06-25 ENCOUNTER — Ambulatory Visit (HOSPITAL_COMMUNITY)
Admission: RE | Admit: 2020-06-25 | Discharge: 2020-06-25 | Disposition: A | Discharge status: Home / Self Care | Payer: Medicare Other | Source: Ambulatory Visit | Attending: Hematology and Oncology | Admitting: Hematology and Oncology

## 2020-06-25 ENCOUNTER — Inpatient Hospital Stay: Payer: Medicare Other | Attending: Hematology and Oncology

## 2020-06-25 DIAGNOSIS — R300 Dysuria: Secondary | ICD-10-CM | POA: Insufficient documentation

## 2020-06-25 DIAGNOSIS — C8253 Diffuse follicle center lymphoma, intra-abdominal lymph nodes: Secondary | ICD-10-CM

## 2020-06-25 DIAGNOSIS — C8293 Follicular lymphoma, unspecified, intra-abdominal lymph nodes: Secondary | ICD-10-CM | POA: Diagnosis not present

## 2020-06-25 DIAGNOSIS — R1030 Lower abdominal pain, unspecified: Secondary | ICD-10-CM | POA: Diagnosis not present

## 2020-06-25 DIAGNOSIS — R590 Localized enlarged lymph nodes: Secondary | ICD-10-CM | POA: Diagnosis not present

## 2020-06-25 DIAGNOSIS — Z5111 Encounter for antineoplastic chemotherapy: Secondary | ICD-10-CM | POA: Diagnosis not present

## 2020-06-25 DIAGNOSIS — R42 Dizziness and giddiness: Secondary | ICD-10-CM | POA: Insufficient documentation

## 2020-06-25 DIAGNOSIS — R509 Fever, unspecified: Secondary | ICD-10-CM | POA: Diagnosis not present

## 2020-06-25 DIAGNOSIS — R197 Diarrhea, unspecified: Secondary | ICD-10-CM | POA: Diagnosis not present

## 2020-06-25 DIAGNOSIS — N898 Other specified noninflammatory disorders of vagina: Secondary | ICD-10-CM | POA: Diagnosis not present

## 2020-06-25 DIAGNOSIS — C8288 Other types of follicular lymphoma, lymph nodes of multiple sites: Secondary | ICD-10-CM | POA: Insufficient documentation

## 2020-06-25 DIAGNOSIS — R110 Nausea: Secondary | ICD-10-CM | POA: Insufficient documentation

## 2020-06-25 LAB — CMP (CANCER CENTER ONLY)
ALT: 14 U/L (ref 0–44)
AST: 18 U/L (ref 15–41)
Albumin: 3.6 g/dL (ref 3.5–5.0)
Alkaline Phosphatase: 112 U/L (ref 38–126)
Anion gap: 5 (ref 5–15)
BUN: 10 mg/dL (ref 8–23)
CO2: 29 mmol/L (ref 22–32)
Calcium: 9.4 mg/dL (ref 8.9–10.3)
Chloride: 107 mmol/L (ref 98–111)
Creatinine: 0.76 mg/dL (ref 0.44–1.00)
GFR, Estimated: >60 mL/min (ref >60)
Glucose, Bld: 89 mg/dL (ref 70–99)
Potassium: 4.3 mmol/L (ref 3.5–5.1)
Sodium: 141 mmol/L (ref 135–145)
Total Bilirubin: 0.4 mg/dL (ref 0.3–1.2)
Total Protein: 6.9 g/dL (ref 6.5–8.1)

## 2020-06-25 LAB — CBC WITH DIFFERENTIAL (CANCER CENTER ONLY)
Abs Immature Granulocytes: 0.02 10*3/uL (ref 0.00–0.07)
Basophils Absolute: 0.1 10*3/uL (ref 0.0–0.1)
Basophils Relative: 1 %
Eosinophils Absolute: 0.5 10*3/uL (ref 0.0–0.5)
Eosinophils Relative: 9 %
HCT: 45.9 % (ref 36.0–46.0)
Hemoglobin: 15.3 g/dL — ABNORMAL HIGH (ref 12.0–15.0)
Immature Granulocytes: 0 %
Lymphocytes Relative: 27 %
Lymphs Abs: 1.6 10*3/uL (ref 0.7–4.0)
MCH: 31 pg (ref 26.0–34.0)
MCHC: 33.3 g/dL (ref 30.0–36.0)
MCV: 93.1 fL (ref 80.0–100.0)
Monocytes Absolute: 0.5 10*3/uL (ref 0.1–1.0)
Monocytes Relative: 8 %
Neutro Abs: 3.3 10*3/uL (ref 1.7–7.7)
Neutrophils Relative %: 55 %
Platelet Count: 233 10*3/uL (ref 150–400)
RBC: 4.93 MIL/uL (ref 3.87–5.11)
RDW: 12.5 % (ref 11.5–15.5)
WBC Count: 6 10*3/uL (ref 4.0–10.5)
nRBC: 0 % (ref 0.0–0.2)

## 2020-06-25 LAB — LACTATE DEHYDROGENASE: LDH: 199 U/L — ABNORMAL HIGH (ref 98–192)

## 2020-06-25 MED ORDER — IOHEXOL 300 MG/ML  SOLN
100.0000 mL | Freq: Once | INTRAMUSCULAR | Status: AC | PRN
  Administered 2020-06-25: 100 mL via INTRAVENOUS

## 2020-06-26 ENCOUNTER — Encounter: Payer: Medicare (Managed Care) | Attending: Family | Primary: Family

## 2020-06-28 ENCOUNTER — Other Ambulatory Visit: Payer: Self-pay | Admitting: Hematology and Oncology

## 2020-06-28 DIAGNOSIS — C8253 Diffuse follicle center lymphoma, intra-abdominal lymph nodes: Secondary | ICD-10-CM

## 2020-06-28 NOTE — Progress Notes (Signed)
[place] [phone]   [phone]  PROGRESS NOTE  Patient Care Team: Prince Solian, MD as PCP - General (Internal Medicine) Mcarthur Rossetti, MD as Consulting Physician (Orthopedic Surgery)  Hematological/Oncological History # Non-Hodgkin B Cell Lymphoma, Follicular Lymphoma. Stage III 1) 02/25/2020: patient underwent excision of a 1.3 cm mobile nodule on the left vertex of the scalp. 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  2) 03/18/2020: Establish care with Dr. Lorenso Courier  3) 03/30/2020: PET CT scan showed  involvement of the left scalp, left axillary lymph node, left hilar and infrahilar nodes, subcarinal node, and mesenteric lymph nodes, as well as a masslike appearance/ high metabolic activity in the lower uterus/cervix.  4) 04/21/2020: biopsy of the mass in the lower uterus/cervix consistent with follicular lymphoma.  5) 06/25/2020: CT C/A/P showed the soft tissue mass in the abdomen increase in size from 5.8 x 3.6 cm to 6.5 x 5.7 cm. The other lymphadenopathy was stable. Patient elected to move forward with treatment.   Interval History:  Alyssa Elliott 69 y.o. female with medical history significant for diffuse non Hodgkin B cell lymphoma who presents for a follow up visit. The patient's last visit was on 04/29/2020 at which time we discussed treatment options moving forward. In the interim since the last visit the patient had a new CT scan which shows steady increase in the size of the pelvis nodal mass ( 5.8 x 3.6 cm to 6.5 x 5.7 cm).   On exam today Alyssa Elliott is accompanied by her husband.  She reports that the knot on her head feels larger, but otherwise she has not felt new or worsening lymph nodes elsewhere.  Endorses that her primary symptom has been fatigue and reports that this occurs a couple days per week.  She notes that she has not been having any issues with  fevers, chills, sweats, nausea, vomiting or diarrhea.  She denies any bleeding or bruising.  A full 10 point ROS is listed below.  The bulk of our discussion today focused on the treatments for follicular lymphoma and the options moving forward.  This discussion is detailed below.  MEDICAL HISTORY:  Past Medical History:  Diagnosis Date  . Allergy   . Anxiety   . Asthma   . Cataract 2018   removed  . Complication of anesthesia    drop in BP  . DDD (degenerative disc disease)   . Depression   . GERD (gastroesophageal reflux disease)   . Hemorrhoids   . Hyperlipidemia   . Lymphoma ([place]) dx 2021   no tx for now  . Vitamin D deficiency     SURGICAL HISTORY: Past Surgical History:  Procedure Laterality Date  . ATRIAL ABLATION SURGERY  1997   SVT  . cataract surgery Bilateral 2017  . COLONOSCOPY  2016  . FOOT NEUROMA SURGERY     multiple times bilaterally  . PLANTAR FASCIA RELEASE Left 1999 or 2000  . SHOULDER ARTHROSCOPY Left 04/19/2019   Procedure: LEFT SHOULDER MANIPULATION UNDER ANESTHESIA AND ARTHROSCOPY WITH EXTENSIVE DEBRIDEMENT;  Surgeon: Mcarthur Rossetti, MD;  Location: [facility];  Service: Orthopedics;  Laterality: Left;  . UPPER GI ENDOSCOPY  yrs ago    SOCIAL HISTORY: Social History   Socioeconomic History  . Marital status: Married    Spouse name: Not on file  . Number of children: 0  . Years of education: Not on file  . Highest education level: Not  on file  Occupational History  . Occupation: retired Catering manager  Tobacco Use  . Smoking status: Never Smoker  . Smokeless tobacco: Never Used  Vaping Use  . Vaping Use: Never used  Substance and Sexual Activity  . Alcohol use: No  . Drug use: No  . Sexual activity: Not Currently    Partners: Male    Birth control/protection: Post-menopausal  Other Topics Concern  . Not on file  Social History Narrative  . Not on file   Social Determinants of Health   Financial Resource Strain:   .  Difficulty of Paying Living Expenses: Not on file  Food Insecurity:   . Worried About Charity fundraiser in the Last Year: Not on file  . Ran Out of Food in the Last Year: Not on file  Transportation Needs:   . Lack of Transportation (Medical): Not on file  . Lack of Transportation (Non-Medical): Not on file  Physical Activity:   . Days of Exercise per Week: Not on file  . Minutes of Exercise per Session: Not on file  Stress:   . Feeling of Stress : Not on file  Social Connections:   . Frequency of Communication with Friends and Family: Not on file  . Frequency of Social Gatherings with Friends and Family: Not on file  . Attends Religious Services: Not on file  . Active Member of Clubs or Organizations: Not on file  . Attends Archivist Meetings: Not on file  . Marital Status: Not on file  Intimate Partner Violence:   . Fear of Current or Ex-Partner: Not on file  . Emotionally Abused: Not on file  . Physically Abused: Not on file  . Sexually Abused: Not on file    FAMILY HISTORY: Family History  Problem Relation Age of Onset  . Heart disease Mother   . Diabetes Mother   . Tuberculosis Paternal Grandfather   . Bone cancer Paternal Grandfather   . Breast cancer Sister 29  . Melanoma Sister   . Cirrhosis Maternal Grandfather   . Heart failure Paternal Grandmother   . Colon cancer Neg Hx   . Ovarian cancer Neg Hx   . Uterine cancer Neg Hx     ALLERGIES:  is allergic to codeine, penicillins, latex, and sulfa antibiotics.  MEDICATIONS:  Current Outpatient Medications  Medication Sig Dispense Refill  . acetaminophen (TYLENOL) 500 MG tablet Take 1,000 mg by mouth every 6 (six) hours as needed for moderate pain or headache.    Marland Kitchen acyclovir (ZOVIRAX) 400 MG tablet Take 1 tablet (400 mg total) by mouth 2 (two) times daily. 180 tablet 1  . allopurinol (ZYLOPRIM) 300 MG tablet Take 1 tablet (300 mg total) by mouth daily. 90 tablet 1  . ARIPiprazole (ABILIFY) 2 MG tablet  Take 2 mg by mouth daily.     Marland Kitchen aspirin 81 MG tablet Take 81 mg by mouth at bedtime.     . [employer] FIBER GUMMIES PO Take 2 each by mouth daily.     Marland Kitchen EPINEPHrine 0.3 mg/0.3 mL IJ SOAJ injection Inject 0.3 mg into the muscle as needed for anaphylaxis.     . fluticasone furoate-vilanterol (BREO ELLIPTA) 200-25 MCG/INH AEPB Inhale 1 puff into the lungs daily.    Marland Kitchen gabapentin (NEURONTIN) 100 MG capsule Take by mouth.    Marland Kitchen GLUCOSAMINE-CHONDROITIN-MSM PO Take 1 tablet by mouth 2 (two) times a day.    . levalbuterol (XOPENEX HFA) 45 MCG/ACT inhaler Inhale 1 puff into  the lungs every 4 (four) hours as needed for wheezing or shortness of breath. 1 Inhaler 3  . lidocaine-prilocaine (EMLA) cream Apply 1 application topically as needed. 30 g 1  . Melatonin 5 MG TABS Take 20 mg by mouth at bedtime.     . meloxicam (MOBIC) 15 MG tablet Take 15 mg by mouth daily as needed for pain.  (Patient not taking: Reported on 04/03/2020)    . NONFORMULARY OR COMPOUNDED ITEM Estradiol 0.02% Insert 1 gram vaginally 2 times per week. 3 month supply with 3 refills. (Patient not taking: Reported on 04/03/2020) 3 each 3  . nystatin cream (MYCOSTATIN) Apply 1 application topically 2 (two) times daily. Apply to affected area BID for up to 7 days. 30 g 0  . ondansetron (ZOFRAN) 8 MG tablet Take 1 tablet (8 mg total) by mouth every 8 (eight) hours as needed for nausea or vomiting. 30 tablet 0  . pantoprazole (PROTONIX) 40 MG tablet Take 40 mg by mouth 2 (two) times daily.    Marland Kitchen PARoxetine (PAXIL) 40 MG tablet Take 40 mg by mouth daily.     . pravastatin (PRAVACHOL) 40 MG tablet Take 40 mg by mouth at bedtime.     . prochlorperazine (COMPAZINE) 10 MG tablet Take 1 tablet (10 mg total) by mouth every 6 (six) hours as needed for nausea or vomiting. 30 tablet 0  . SINGULAIR 10 MG tablet Take 10 mg by mouth at bedtime.     . Vitamin D, Ergocalciferol, (DRISDOL) 50000 UNITS CAPS Take 50,000 Units by mouth every Monday.      No current  facility-administered medications for this visit.    REVIEW OF SYSTEMS:   Constitutional: ( - ) fevers, ( - )  chills , ( - ) night sweats Eyes: ( - ) blurriness of vision, ( - ) double vision, ( - ) watery eyes Ears, nose, mouth, throat, and face: ( - ) mucositis, ( - ) sore throat Respiratory: ( - ) cough, ( - ) dyspnea, ( - ) wheezes Cardiovascular: ( - ) palpitation, ( - ) chest discomfort, ( - ) lower extremity swelling Gastrointestinal:  ( - ) nausea, ( - ) heartburn, ( - ) change in bowel habits Skin: ( - ) abnormal skin rashes Lymphatics: ( - ) new lymphadenopathy, ( - ) easy bruising Neurological: ( - ) numbness, ( - ) tingling, ( - ) new weaknesses Behavioral/Psych: ( - ) mood change, ( - ) new changes  All other systems were reviewed with the patient and are negative.  PHYSICAL EXAMINATION: ECOG PERFORMANCE STATUS: 1 - Symptomatic but completely ambulatory  Vitals:   06/29/20 1455  BP: (!) 141/70  Pulse: 90  Resp: 18  Temp: 98.3 F (36.8 C)  SpO2: 98%   Filed Weights   06/29/20 1455  Weight: 161 lb 1.6 oz (73.1 kg)    GENERAL: well appearing elderly Caucasian female. alert, no distress and comfortable SKIN: scalp lesion appears to be growing, increased in size from prior visit. skin color, texture, turgor are normal, no rashes or significant lesions EYES: conjunctiva are pink and non-injected, sclera clear LYMPH:  no palpable lymphadenopathy in the cervical, axillary or supraclavicular LUNGS: clear to auscultation and percussion with normal breathing effort HEART: regular rate & rhythm and no murmurs and no lower extremity edema Musculoskeletal: no cyanosis of digits and no clubbing  PSYCH: alert & oriented x 3, fluent speech NEURO: no focal motor/sensory deficits  LABORATORY DATA:  I have  reviewed the data as listed CBC Latest Ref Rng & Units 06/25/2020 04/29/2020 04/21/2020  WBC 4.0 - 10.5 K/uL 6.0 5.1 5.7  Hemoglobin 12.0 - 15.0 g/dL 15.3(H) 14.7 14.6   Hematocrit 36 - 46 % 45.9 44.9 45.0  Platelets 150 - 400 K/uL 233 236 222    CMP Latest Ref Rng & Units 06/25/2020 04/29/2020 04/06/2020  Glucose 70 - 99 mg/dL 89 98 104(H)  BUN 8 - 23 mg/dL 10 11 9   Creatinine 0.44 - 1.00 mg/dL 0.76 0.78 0.81  Sodium 135 - 145 mmol/L 141 144 143  Potassium 3.5 - 5.1 mmol/L 4.3 4.2 3.9  Chloride 98 - 111 mmol/L 107 109 106  CO2 22 - 32 mmol/L 29 26 27   Calcium 8.9 - 10.3 mg/dL 9.4 9.4 9.1  Total Protein 6.5 - 8.1 g/dL 6.9 6.5 -  Total Bilirubin 0.3 - 1.2 mg/dL 0.4 0.5 -  Alkaline Phos 38 - 126 U/L 112 110 -  AST 15 - 41 U/L 18 20 -  ALT 0 - 44 U/L 14 16 -    No results found for: MPROTEIN No results found for: KPAFRELGTCHN, LAMBDASER, KAPLAMBRATIO   RADIOGRAPHIC STUDIES: I have personally reviewed the radiological images as listed and agreed with the findings in the report: FDG avid lesion on left scalp, left axilla, mediastinum, and abdominal lymph nodes. Uterus and cervix light up brightly on this scan.   CT CHEST ABDOMEN PELVIS W CONTRAST  Result Date: 06/25/2020 CLINICAL DATA:  Follow-up B-cell follicular lymphoma. EXAM: CT CHEST, ABDOMEN, AND PELVIS WITH CONTRAST TECHNIQUE: Multidetector CT imaging of the chest, abdomen and pelvis was performed following the standard protocol during bolus administration of intravenous contrast. CONTRAST:  112mL OMNIPAQUE IOHEXOL 300 MG/ML  SOLN COMPARISON:  PET-CT on 03/30/2020 FINDINGS: CT CHEST FINDINGS Cardiovascular: No acute findings. Mediastinum/Lymph Nodes: 10 mm left axillary lymph node is seen on image 14/2, without significant change since previous study. No other pathologically enlarged lymph nodes identified. Lungs/Pleura: No pulmonary infiltrate or mass identified. No effusion present. Musculoskeletal:  No suspicious bone lesions identified. CT ABDOMEN AND PELVIS FINDINGS Hepatobiliary: No masses identified. Gallbladder is unremarkable. No evidence of biliary ductal dilatation. Pancreas:  No mass or  inflammatory changes. Spleen:  Within normal limits in size and appearance. Adrenals/Urinary tract:  No masses or hydronephrosis. Stomach/Bowel: No evidence of obstruction, inflammatory process, or abnormal fluid collections. Vascular/Lymphatic: Mild mesenteric lymphadenopathy is seen with lymph nodes measuring up to 12 mm, without significant change since prior exam. No new or increased areas of lymphadenopathy identified. No abdominal aortic aneurysm. Reproductive: A soft tissue mass is seen involving the vagina which measures 6.5 x 5.7 cm on image 110/2. This shows increased size compared to 5.8 x 3.6 cm on prior exam. This mass now appears to involve the posterior wall of the urinary bladder. Adnexal regions are unremarkable. Other:  None. Musculoskeletal:  No suspicious bone lesions identified. IMPRESSION: Mild increase in size of soft tissue mass involving the vagina, which now appears to involve the posterior wall of urinary bladder. Stable mild mesenteric and left axillary lymphadenopathy. No new or progressive lymphadenopathy identified. Electronically Signed   By: Marlaine Hind M.D.   On: 06/25/2020 20:07    ASSESSMENT & PLAN Alyssa Elliott 69 y.o. female with medical history significant for diffuse non Hodgkin B cell lymphoma who presents for a follow up visit.  After review of the imaging, review the labs, discussion with the patient her findings are most consistent with a diffuse  non-Hodgkin B-cell lymphoma, follicular lymphoma.  The initial diagnosis based off the biopsy from the lesion on the head was that of a follicle cell lymphoma which tends to be a cutaneous lymphoma, however this diagnosis was considerably less likely given the diffuse spread throughout the lymph nodes of the chest and abdomen. Biopsy of the FDG avid lesion in the pelvis resulted with follicular lymphoma.   On exam today Mrs. Goerke is accompanied by her husband.  Other than fatigue the patient is relatively  asymptomatic.  The bulk of our discussion focused on the treatment options for follicular lymphoma.  Today we discussed the diagnosis of follicular lymphoma and the options moving forward.  She currently does not meet any GELF Criteria and therefore is not required to start therapy.  We discussed that treatment could be started if she so desired and we discussed the risks and benefits of bendamustine rituximab therapy.  She voiced her understanding of the different options between starting treatment and continued observation.  After discussion with her husband she noted she would like to start treatment given the continued growth of the lesion which is now abutting the urinary bladder.   The regimen of choice for this patient is Bendamustine/Rituximab. Treatment will consist of 6 cycles, each lasting 28 days. The patient will receive Bendamustine 90mg /m2 IV on Day 1 and Day 2 in addition to Rituximab 375mg /m2 IV on Day 1. This is being administered with curative intent. Administered x 6 cycles.   # Non-Hodgkin B Cell Lymphoma, Follicular Lymphoma. Stage III --findings show lymphadenopathy on both sides of the diaphragm. Consistent with at least a Stage III lymphoma -- confirmed the diagnosis with biopsy of the FDG avid mass near the uterus/cervix. Cancel the left axillary lymph node biopsy --will start treatment with R-Benda, regimen noted above.  --discussed the steps moving forward with the patient including chemotherapy education, port placement, and supportive care (noted below)   --RTC for start of treatment in approximately 1-2 weeks time.   #Supportive Care --Chemotherapy Education to be scheduled  --zofran 8mg  q8H PRN and compazine 10mg  PO q6H for nausea --acyclovir 400mg  PO BID for VCZ prophylaxis -- allopurinol 300mg  PO daily for TLS prophylaxis -- EMLA cream for port -- no pain medication required at this time.   No orders of the defined types were placed in this encounter.  All  questions were answered. The patient knows to call the clinic with any problems, questions or concerns.  A total of more than 30 minutes were spent on this encounter and over half of that time was spent on counseling and coordination of care as outlined above.   Ledell Peoples, MD Department of Hematology/Oncology [place] [facility] Phone: [phone] Pager: [phone] Email: [email or web address]  06/30/2020 2:22 PM

## 2020-06-29 ENCOUNTER — Inpatient Hospital Stay (HOSPITAL_BASED_OUTPATIENT_CLINIC_OR_DEPARTMENT_OTHER): Payer: Medicare Other | Admitting: Hematology and Oncology

## 2020-06-29 ENCOUNTER — Other Ambulatory Visit: Payer: Self-pay

## 2020-06-29 ENCOUNTER — Other Ambulatory Visit: Payer: Medicare Other

## 2020-06-29 VITALS — BP 141/70 | HR 90 | Temp 98.3°F | Resp 18 | Ht 64.0 in | Wt 161.1 lb

## 2020-06-29 DIAGNOSIS — C8288 Other types of follicular lymphoma, lymph nodes of multiple sites: Secondary | ICD-10-CM | POA: Diagnosis not present

## 2020-06-29 DIAGNOSIS — R42 Dizziness and giddiness: Secondary | ICD-10-CM | POA: Diagnosis not present

## 2020-06-29 DIAGNOSIS — C8253 Diffuse follicle center lymphoma, intra-abdominal lymph nodes: Secondary | ICD-10-CM | POA: Diagnosis not present

## 2020-06-29 DIAGNOSIS — R509 Fever, unspecified: Secondary | ICD-10-CM | POA: Diagnosis not present

## 2020-06-29 DIAGNOSIS — R197 Diarrhea, unspecified: Secondary | ICD-10-CM | POA: Diagnosis not present

## 2020-06-29 DIAGNOSIS — R300 Dysuria: Secondary | ICD-10-CM | POA: Diagnosis not present

## 2020-06-29 DIAGNOSIS — Z5111 Encounter for antineoplastic chemotherapy: Secondary | ICD-10-CM | POA: Diagnosis not present

## 2020-06-30 ENCOUNTER — Telehealth: Payer: Self-pay | Admitting: Hematology and Oncology

## 2020-06-30 ENCOUNTER — Ambulatory Visit
Admission: RE | Admit: 2020-06-30 | Discharge: 2020-06-30 | Disposition: A | Discharge status: Home / Self Care | Payer: Medicare Other | Source: Ambulatory Visit | Attending: Obstetrics and Gynecology | Admitting: Obstetrics and Gynecology

## 2020-06-30 ENCOUNTER — Other Ambulatory Visit: Payer: Self-pay | Admitting: *Deleted

## 2020-06-30 DIAGNOSIS — M810 Age-related osteoporosis without current pathological fracture: Secondary | ICD-10-CM

## 2020-06-30 DIAGNOSIS — M8589 Other specified disorders of bone density and structure, multiple sites: Secondary | ICD-10-CM | POA: Diagnosis not present

## 2020-06-30 DIAGNOSIS — J301 Allergic rhinitis due to pollen: Secondary | ICD-10-CM | POA: Diagnosis not present

## 2020-06-30 DIAGNOSIS — C8253 Diffuse follicle center lymphoma, intra-abdominal lymph nodes: Secondary | ICD-10-CM

## 2020-06-30 DIAGNOSIS — J3089 Other allergic rhinitis: Secondary | ICD-10-CM | POA: Diagnosis not present

## 2020-06-30 DIAGNOSIS — Z780 Asymptomatic menopausal state: Secondary | ICD-10-CM | POA: Diagnosis not present

## 2020-06-30 DIAGNOSIS — M8580 Other specified disorders of bone density and structure, unspecified site: Secondary | ICD-10-CM

## 2020-06-30 MED ORDER — ONDANSETRON HCL 8 MG PO TABS: 8.0000 mg | ORAL_TABLET | Freq: Three times a day (TID) | ORAL | 0 refills | Status: DC | PRN

## 2020-06-30 MED ORDER — LIDOCAINE-PRILOCAINE 2.5-2.5 % EX CREA: 1.0000 "application " | TOPICAL_CREAM | CUTANEOUS | 1 refills | Status: DC | PRN

## 2020-06-30 MED ORDER — PROCHLORPERAZINE MALEATE 10 MG PO TABS: 10.0000 mg | ORAL_TABLET | Freq: Four times a day (QID) | ORAL | 0 refills | Status: DC | PRN

## 2020-06-30 MED ORDER — ALLOPURINOL 300 MG PO TABS: 300.0000 mg | ORAL_TABLET | Freq: Every day | ORAL | 1 refills | Status: DC

## 2020-06-30 MED ORDER — ACYCLOVIR 400 MG PO TABS: 400.0000 mg | ORAL_TABLET | Freq: Two times a day (BID) | ORAL | 1 refills | Status: DC

## 2020-06-30 NOTE — Progress Notes (Signed)
START ON PATHWAY REGIMEN - Lymphoma and CLL     A cycle is every 28 days:     Bendamustine      Rituximab-xxxx   **Always confirm dose/schedule in your pharmacy ordering system**  Patient Characteristics: Follicular Lymphoma, Grades 1, 2, and 3A, First Line, Stage III / IV, Symptomatic or Bulky Disease Disease Type: Follicular Lymphoma, Grade 1, 2, or 3A Disease Type: Not Applicable Disease Type: Not Applicable Line of Therapy: First Line Disease Characteristics: Symptomatic or Bulky Disease Intent of Therapy: Curative Intent, Discussed with Patient

## 2020-06-30 NOTE — Telephone Encounter (Signed)
Scheduled appointment per 10/12 scheduling message. Spoke to patient who is aware of appointment date and time.

## 2020-07-01 ENCOUNTER — Other Ambulatory Visit: Payer: Self-pay

## 2020-07-01 ENCOUNTER — Inpatient Hospital Stay: Payer: Medicare Other

## 2020-07-02 ENCOUNTER — Telehealth: Payer: Self-pay | Admitting: *Deleted

## 2020-07-02 NOTE — Telephone Encounter (Signed)
[name] patient regarding appts for his 1st time Rituxan/Bendeka now scheduled for next week on 10/18 and 10/19.  Pt voiced understanding. She is aware of her port placement the following Monday, 07/13/20..  No questions or concerns. She did have her chemo education class yesterday. She states it was very useful and was very appreciative of Valora Piccolo attention and help.

## 2020-07-03 ENCOUNTER — Telehealth: Payer: Self-pay | Admitting: Hematology and Oncology

## 2020-07-03 NOTE — Telephone Encounter (Signed)
Called and spoke with patient about added appts. Confirmed all

## 2020-07-06 ENCOUNTER — Inpatient Hospital Stay: Payer: Medicare Other

## 2020-07-06 ENCOUNTER — Other Ambulatory Visit: Payer: Self-pay

## 2020-07-06 VITALS — BP 108/52 | HR 77 | Temp 98.2°F | Resp 16

## 2020-07-06 DIAGNOSIS — R197 Diarrhea, unspecified: Secondary | ICD-10-CM | POA: Diagnosis not present

## 2020-07-06 DIAGNOSIS — C8253 Diffuse follicle center lymphoma, intra-abdominal lymph nodes: Secondary | ICD-10-CM

## 2020-07-06 DIAGNOSIS — Z5111 Encounter for antineoplastic chemotherapy: Secondary | ICD-10-CM | POA: Diagnosis not present

## 2020-07-06 DIAGNOSIS — R509 Fever, unspecified: Secondary | ICD-10-CM | POA: Diagnosis not present

## 2020-07-06 DIAGNOSIS — C8288 Other types of follicular lymphoma, lymph nodes of multiple sites: Secondary | ICD-10-CM | POA: Diagnosis not present

## 2020-07-06 DIAGNOSIS — R42 Dizziness and giddiness: Secondary | ICD-10-CM | POA: Diagnosis not present

## 2020-07-06 DIAGNOSIS — R300 Dysuria: Secondary | ICD-10-CM | POA: Diagnosis not present

## 2020-07-06 LAB — HEPATITIS B CORE ANTIBODY, TOTAL: Hep B Core Total Ab: NONREACTIVE

## 2020-07-06 LAB — HEPATITIS B SURFACE ANTIBODY,QUALITATIVE: Hep B S Ab: NONREACTIVE

## 2020-07-06 LAB — CMP (CANCER CENTER ONLY)
ALT: 18 U/L (ref 0–44)
AST: 19 U/L (ref 15–41)
Albumin: 3.4 g/dL — ABNORMAL LOW (ref 3.5–5.0)
Alkaline Phosphatase: 112 U/L (ref 38–126)
Anion gap: 9 (ref 5–15)
BUN: 12 mg/dL (ref 8–23)
CO2: 24 mmol/L (ref 22–32)
Calcium: 9.2 mg/dL (ref 8.9–10.3)
Chloride: 109 mmol/L (ref 98–111)
Creatinine: 0.78 mg/dL (ref 0.44–1.00)
GFR, Estimated: >60 mL/min (ref >60)
Glucose, Bld: 102 mg/dL — ABNORMAL HIGH (ref 70–99)
Potassium: 3.9 mmol/L (ref 3.5–5.1)
Sodium: 142 mmol/L (ref 135–145)
Total Bilirubin: 0.4 mg/dL (ref 0.3–1.2)
Total Protein: 6.6 g/dL (ref 6.5–8.1)

## 2020-07-06 LAB — CBC WITH DIFFERENTIAL (CANCER CENTER ONLY)
Abs Immature Granulocytes: 0.01 10*3/uL (ref 0.00–0.07)
Basophils Absolute: 0.1 10*3/uL (ref 0.0–0.1)
Basophils Relative: 2 %
Eosinophils Absolute: 0.5 10*3/uL (ref 0.0–0.5)
Eosinophils Relative: 10 %
HCT: 44.4 % (ref 36.0–46.0)
Hemoglobin: 14.8 g/dL (ref 12.0–15.0)
Immature Granulocytes: 0 %
Lymphocytes Relative: 26 %
Lymphs Abs: 1.3 10*3/uL (ref 0.7–4.0)
MCH: 30.6 pg (ref 26.0–34.0)
MCHC: 33.3 g/dL (ref 30.0–36.0)
MCV: 91.7 fL (ref 80.0–100.0)
Monocytes Absolute: 0.5 10*3/uL (ref 0.1–1.0)
Monocytes Relative: 9 %
Neutro Abs: 2.8 10*3/uL (ref 1.7–7.7)
Neutrophils Relative %: 53 %
Platelet Count: 225 10*3/uL (ref 150–400)
RBC: 4.84 MIL/uL (ref 3.87–5.11)
RDW: 12.3 % (ref 11.5–15.5)
WBC Count: 5.2 10*3/uL (ref 4.0–10.5)
nRBC: 0 % (ref 0.0–0.2)

## 2020-07-06 LAB — LACTATE DEHYDROGENASE: LDH: 174 U/L (ref 98–192)

## 2020-07-06 MED ORDER — SODIUM CHLORIDE 0.9 % IV SOLN
90.0000 mg/m2 | Freq: Once | INTRAVENOUS | Status: AC
  Administered 2020-07-06: 175 mg via INTRAVENOUS
  Filled 2020-07-06: qty 7

## 2020-07-06 MED ORDER — PALONOSETRON HCL INJECTION 0.25 MG/5ML
INTRAVENOUS | Status: AC
  Filled 2020-07-06: qty 5

## 2020-07-06 MED ORDER — DIPHENHYDRAMINE HCL 25 MG PO CAPS
50.0000 mg | ORAL_CAPSULE | Freq: Once | ORAL | Status: AC
  Administered 2020-07-06: 50 mg via ORAL

## 2020-07-06 MED ORDER — DIPHENHYDRAMINE HCL 25 MG PO CAPS
ORAL_CAPSULE | ORAL | Status: AC
  Filled 2020-07-06: qty 2

## 2020-07-06 MED ORDER — ACETAMINOPHEN 325 MG PO TABS
650.0000 mg | ORAL_TABLET | Freq: Once | ORAL | Status: AC
  Administered 2020-07-06: 650 mg via ORAL

## 2020-07-06 MED ORDER — ACETAMINOPHEN 325 MG PO TABS
ORAL_TABLET | ORAL | Status: AC
  Filled 2020-07-06: qty 2

## 2020-07-06 MED ORDER — SODIUM CHLORIDE 0.9 % IV SOLN
375.0000 mg/m2 | Freq: Once | INTRAVENOUS | Status: AC
  Administered 2020-07-06: 700 mg via INTRAVENOUS
  Filled 2020-07-06: qty 50

## 2020-07-06 MED ORDER — SODIUM CHLORIDE 0.9 % IV SOLN
Freq: Once | INTRAVENOUS | Status: AC
  Filled 2020-07-06: qty 250

## 2020-07-06 MED ORDER — SODIUM CHLORIDE 0.9 % IV SOLN
10.0000 mg | Freq: Once | INTRAVENOUS | Status: AC
  Administered 2020-07-06: 10 mg via INTRAVENOUS
  Filled 2020-07-06: qty 10

## 2020-07-06 MED ORDER — PALONOSETRON HCL INJECTION 0.25 MG/5ML
0.2500 mg | Freq: Once | INTRAVENOUS | Status: AC
  Administered 2020-07-06: 0.25 mg via INTRAVENOUS

## 2020-07-06 NOTE — Patient Instructions (Signed)
[place] Discharge Instructions for Patients Receiving Chemotherapy  Today you received the following chemotherapy agents Rituxan, Bendeka   To help prevent nausea and vomiting after your treatment, we encourage you to take your nausea medication as directed by MD  If you develop nausea and vomiting that is not controlled by your nausea medication, call the clinic.   BELOW ARE SYMPTOMS THAT SHOULD BE REPORTED IMMEDIATELY:  *FEVER GREATER THAN 100.5 F  *CHILLS WITH OR WITHOUT FEVER  NAUSEA AND VOMITING THAT IS NOT CONTROLLED WITH YOUR NAUSEA MEDICATION  *UNUSUAL SHORTNESS OF BREATH  *UNUSUAL BRUISING OR BLEEDING  TENDERNESS IN MOUTH AND THROAT WITH OR WITHOUT PRESENCE OF ULCERS  *URINARY PROBLEMS  *BOWEL PROBLEMS  UNUSUAL RASH Items with * indicate a potential emergency and should be followed up as soon as possible.  Feel free to call the clinic should you have any questions or concerns. The clinic phone number is [phone].  Please show the [place] at check-in to the Emergency Department and triage nurse.

## 2020-07-07 ENCOUNTER — Other Ambulatory Visit: Payer: Self-pay

## 2020-07-07 ENCOUNTER — Inpatient Hospital Stay: Payer: Medicare Other

## 2020-07-07 VITALS — BP 110/64 | HR 71 | Temp 98.2°F | Resp 16

## 2020-07-07 DIAGNOSIS — R42 Dizziness and giddiness: Secondary | ICD-10-CM | POA: Diagnosis not present

## 2020-07-07 DIAGNOSIS — Z5111 Encounter for antineoplastic chemotherapy: Secondary | ICD-10-CM | POA: Diagnosis not present

## 2020-07-07 DIAGNOSIS — R509 Fever, unspecified: Secondary | ICD-10-CM | POA: Diagnosis not present

## 2020-07-07 DIAGNOSIS — R197 Diarrhea, unspecified: Secondary | ICD-10-CM | POA: Diagnosis not present

## 2020-07-07 DIAGNOSIS — C8253 Diffuse follicle center lymphoma, intra-abdominal lymph nodes: Secondary | ICD-10-CM

## 2020-07-07 DIAGNOSIS — C8288 Other types of follicular lymphoma, lymph nodes of multiple sites: Secondary | ICD-10-CM | POA: Diagnosis not present

## 2020-07-07 DIAGNOSIS — R300 Dysuria: Secondary | ICD-10-CM | POA: Diagnosis not present

## 2020-07-07 MED ORDER — SODIUM CHLORIDE 0.9 % IV SOLN
10.0000 mg | Freq: Once | INTRAVENOUS | Status: AC
  Administered 2020-07-07: 10 mg via INTRAVENOUS
  Filled 2020-07-07: qty 10

## 2020-07-07 MED ORDER — SODIUM CHLORIDE 0.9 % IV SOLN
Freq: Once | INTRAVENOUS | Status: AC
  Filled 2020-07-07: qty 250

## 2020-07-07 MED ORDER — SODIUM CHLORIDE 0.9 % IV SOLN
90.0000 mg/m2 | Freq: Once | INTRAVENOUS | Status: AC
  Administered 2020-07-07: 175 mg via INTRAVENOUS
  Filled 2020-07-07: qty 7

## 2020-07-07 NOTE — Patient Instructions (Signed)
[place] Discharge Instructions for Patients Receiving Chemotherapy  Today you received the following chemotherapy agents Bendeka   To help prevent nausea and vomiting after your treatment, we encourage you to take your nausea medication as directed by MD  If you develop nausea and vomiting that is not controlled by your nausea medication, call the clinic.   BELOW ARE SYMPTOMS THAT SHOULD BE REPORTED IMMEDIATELY:  *FEVER GREATER THAN 100.5 F  *CHILLS WITH OR WITHOUT FEVER  NAUSEA AND VOMITING THAT IS NOT CONTROLLED WITH YOUR NAUSEA MEDICATION  *UNUSUAL SHORTNESS OF BREATH  *UNUSUAL BRUISING OR BLEEDING  TENDERNESS IN MOUTH AND THROAT WITH OR WITHOUT PRESENCE OF ULCERS  *URINARY PROBLEMS  *BOWEL PROBLEMS  UNUSUAL RASH Items with * indicate a potential emergency and should be followed up as soon as possible.  Feel free to call the clinic should you have any questions or concerns. The clinic phone number is [phone].  Please show the [place] at check-in to the Emergency Department and triage nurse.

## 2020-07-08 ENCOUNTER — Telehealth: Payer: Self-pay | Admitting: *Deleted

## 2020-07-08 NOTE — Telephone Encounter (Signed)
Received call from patient regarding getting her allergy shots while on chemo. Discussed with Dr. Lorenso Courier and with Pharmacy. They advised that patient discuss with her allergist as the shots may not be as effective while she is on chemo. Pt receives Rituxan and Bendamustine.  Left vm message with the above information.

## 2020-07-09 ENCOUNTER — Other Ambulatory Visit: Payer: Self-pay | Admitting: Radiology

## 2020-07-10 ENCOUNTER — Other Ambulatory Visit: Payer: Self-pay | Admitting: Radiology

## 2020-07-13 ENCOUNTER — Ambulatory Visit (HOSPITAL_COMMUNITY)
Admission: RE | Admit: 2020-07-13 | Discharge: 2020-07-13 | Disposition: A | Discharge status: Home / Self Care | Payer: Medicare Other | Source: Ambulatory Visit | Attending: Hematology and Oncology | Admitting: Hematology and Oncology

## 2020-07-13 ENCOUNTER — Other Ambulatory Visit: Payer: Self-pay

## 2020-07-13 ENCOUNTER — Other Ambulatory Visit: Payer: Self-pay | Admitting: Hematology and Oncology

## 2020-07-13 ENCOUNTER — Encounter (HOSPITAL_COMMUNITY): Payer: Self-pay

## 2020-07-13 DIAGNOSIS — C8510 Unspecified B-cell lymphoma, unspecified site: Secondary | ICD-10-CM | POA: Insufficient documentation

## 2020-07-13 DIAGNOSIS — E559 Vitamin D deficiency, unspecified: Secondary | ICD-10-CM | POA: Insufficient documentation

## 2020-07-13 DIAGNOSIS — K219 Gastro-esophageal reflux disease without esophagitis: Secondary | ICD-10-CM | POA: Diagnosis not present

## 2020-07-13 DIAGNOSIS — J45909 Unspecified asthma, uncomplicated: Secondary | ICD-10-CM | POA: Diagnosis not present

## 2020-07-13 DIAGNOSIS — Z79899 Other long term (current) drug therapy: Secondary | ICD-10-CM | POA: Insufficient documentation

## 2020-07-13 DIAGNOSIS — Z452 Encounter for adjustment and management of vascular access device: Secondary | ICD-10-CM | POA: Diagnosis not present

## 2020-07-13 DIAGNOSIS — F329 Major depressive disorder, single episode, unspecified: Secondary | ICD-10-CM | POA: Diagnosis not present

## 2020-07-13 DIAGNOSIS — E785 Hyperlipidemia, unspecified: Secondary | ICD-10-CM | POA: Diagnosis not present

## 2020-07-13 DIAGNOSIS — F419 Anxiety disorder, unspecified: Secondary | ICD-10-CM | POA: Diagnosis not present

## 2020-07-13 DIAGNOSIS — Z7982 Long term (current) use of aspirin: Secondary | ICD-10-CM | POA: Insufficient documentation

## 2020-07-13 DIAGNOSIS — C8253 Diffuse follicle center lymphoma, intra-abdominal lymph nodes: Secondary | ICD-10-CM

## 2020-07-13 HISTORY — PX: IR IMAGING GUIDED PORT INSERTION: IMG5740

## 2020-07-13 MED ORDER — HEPARIN SOD (PORK) LOCK FLUSH 100 UNIT/ML IV SOLN
INTRAVENOUS | Status: AC
  Filled 2020-07-13: qty 5

## 2020-07-13 MED ORDER — CLINDAMYCIN PHOSPHATE 900 MG/50ML IV SOLN
INTRAVENOUS | Status: AC
  Administered 2020-07-13: 900 mg via INTRAVENOUS
  Filled 2020-07-13: qty 50

## 2020-07-13 MED ORDER — LIDOCAINE-EPINEPHRINE 1 %-1:100000 IJ SOLN
INTRAMUSCULAR | Status: AC | PRN
  Administered 2020-07-13 (×2): 10 mL via INTRADERMAL

## 2020-07-13 MED ORDER — LIDOCAINE-EPINEPHRINE 1 %-1:100000 IJ SOLN
INTRAMUSCULAR | Status: AC
  Filled 2020-07-13: qty 1

## 2020-07-13 MED ORDER — FENTANYL CITRATE (PF) 100 MCG/2ML IJ SOLN
INTRAMUSCULAR | Status: AC
  Filled 2020-07-13: qty 2

## 2020-07-13 MED ORDER — FENTANYL CITRATE (PF) 100 MCG/2ML IJ SOLN
INTRAMUSCULAR | Status: AC | PRN
Start: 2020-07-13 — End: 2020-07-13
  Administered 2020-07-13 (×2): 50 ug via INTRAVENOUS

## 2020-07-13 MED ORDER — CLINDAMYCIN PHOSPHATE 900 MG/50ML IV SOLN: 900.0000 mg | Freq: Once | INTRAVENOUS | Status: AC

## 2020-07-13 MED ORDER — SODIUM CHLORIDE 0.9 % IV SOLN: INTRAVENOUS | Status: DC

## 2020-07-13 MED ORDER — HEPARIN SOD (PORK) LOCK FLUSH 100 UNIT/ML IV SOLN
INTRAVENOUS | Status: AC | PRN
  Administered 2020-07-13: 500 [IU] via INTRAVENOUS

## 2020-07-13 MED ORDER — MIDAZOLAM HCL 2 MG/2ML IJ SOLN
INTRAMUSCULAR | Status: AC | PRN
  Administered 2020-07-13 (×3): 1 mg via INTRAVENOUS

## 2020-07-13 MED ORDER — MIDAZOLAM HCL 2 MG/2ML IJ SOLN
INTRAMUSCULAR | Status: AC
  Filled 2020-07-13: qty 4

## 2020-07-13 NOTE — Progress Notes (Signed)
Elevated HR reported to Dr Serafina Royals post procedure.  EKG obtained and viewed by Ascencion Dike, PA.  HR low 90's and Pt asymptomatic at 1410, approved for discharge by Dr Serafina Royals.  Discharged home with husband and instructed to call on call IR MD or 911 if symptomatic at home. They verbalize understanding.

## 2020-07-13 NOTE — Discharge Instructions (Addendum)
Urgent needs - IR on call MD [phone]  Wound - May remove dressing and shower in 24 to 48 hours.  Keep site clean and dry.  Replace with bandaid. Do not submerge in tub or water until site healing well.  If ordered by your provider, may start Emla cream in 2 weeks or after incision is healed.  After completion of treatment, your provider should have you set up for monthly port flushes.    Implanted Port Insertion, Care After This sheet gives you information about how to care for yourself after your procedure. Your health care provider may also give you more specific instructions. If you have problems or questions, contact your health care provider. What can I expect after the procedure? After the procedure, it is common to have:  Discomfort at the port insertion site.  Bruising on the skin over the port. This should improve over 3-4 days. Follow these instructions at home: Port care  After your port is placed, you will get a manufacturer's information card. The card has information about your port. Keep this card with you at all times.  Take care of the port as told by your health care provider. Ask your health care provider if you or a family member can get training for taking care of the port at home. A home health care nurse may also take care of the port.  Make sure to remember what type of port you have. Incision care  Follow instructions from your health care provider about how to take care of your port insertion site. Make sure you: ? Wash your hands with soap and water before and after you change your bandage (dressing). If soap and water are not available, use hand sanitizer. ? Change your dressing as told by your health care provider. ? Leave stitches (sutures), skin glue, or adhesive strips in place. These skin closures may need to stay in place for 2 weeks or longer. If adhesive strip edges start to loosen and curl up, you may trim the loose edges. Do not remove adhesive  strips completely unless your health care provider tells you to do that.  Check your port insertion site every day for signs of infection. Check for: ? Redness, swelling, or pain. ? Fluid or blood. ? Warmth. ? Pus or a bad smell. Activity  Return to your normal activities as told by your health care provider. Ask your health care provider what activities are safe for you.  Do not lift anything that is heavier than 10 lb (4.5 kg), or the limit that you are told, until your health care provider says that it is safe. General instructions  Take over-the-counter and prescription medicines only as told by your health care provider.  Do not take baths, swim, or use a hot tub until your health care provider approves. Ask your health care provider if you may take showers. You may only be allowed to take sponge baths.  Do not drive for 24 hours if you were given a sedative during your procedure.  Wear a medical alert bracelet in case of an emergency. This will tell any health care providers that you have a port.  Keep all follow-up visits as told by your health care provider. This is important. Contact a health care provider if:  You cannot flush your port with saline as directed, or you cannot draw blood from the port.  You have a fever or chills.  You have redness, swelling, or pain around your port   insertion site.  You have fluid or blood coming from your port insertion site.  Your port insertion site feels warm to the touch.  You have pus or a bad smell coming from the port insertion site. Get help right away if:  You have chest pain or shortness of breath.  You have bleeding from your port that you cannot control. Summary  Take care of the port as told by your health care provider. Keep the manufacturer's information card with you at all times.  Change your dressing as told by your health care provider.  Contact a health care provider if you have a fever or chills or if you  have redness, swelling, or pain around your port insertion site.  Keep all follow-up visits as told by your health care provider. This information is not intended to replace advice given to you by your health care provider. Make sure you discuss any questions you have with your health care provider. Document Revised: 04/03/2018 Document Reviewed: 04/03/2018 Elsevier Patient Education  2020 Elsevier Inc.   Moderate Conscious Sedation, Adult, Care After These instructions provide you with information about caring for yourself after your procedure. Your health care provider may also give you more specific instructions. Your treatment has been planned according to current medical practices, but problems sometimes occur. Call your health care provider if you have any problems or questions after your procedure. What can I expect after the procedure? After your procedure, it is common:  To feel sleepy for several hours.  To feel clumsy and have poor balance for several hours.  To have poor judgment for several hours.  To vomit if you eat too soon. Follow these instructions at home: For at least 24 hours after the procedure:  Do not: ? Participate in activities where you could fall or become injured. ? Drive. ? Use heavy machinery. ? Drink alcohol. ? Take sleeping pills or medicines that cause drowsiness. ? Make important decisions or sign legal documents. ? Take care of children on your own.  Rest. Eating and drinking  Follow the diet recommended by your health care provider.  If you vomit: ? Drink water, juice, or soup when you can drink without vomiting. ? Make sure you have little or no nausea before eating solid foods. General instructions  Have a responsible adult stay with you until you are awake and alert.  Take over-the-counter and prescription medicines only as told by your health care provider.  If you smoke, do not smoke without supervision.  Keep all follow-up  visits as told by your health care provider. This is important. Contact a health care provider if:  You keep feeling nauseous or you keep vomiting.  You feel light-headed.  You develop a rash.  You have a fever. Get help right away if:  You have trouble breathing. This information is not intended to replace advice given to you by your health care provider. Make sure you discuss any questions you have with your health care provider. Document Revised: 08/18/2017 Document Reviewed: 12/26/2015 Elsevier Patient Education  2020 Elsevier Inc.   

## 2020-07-13 NOTE — Procedures (Signed)
Interventional Radiology Procedure Note ° °Procedure: Single Lumen Power Port Placement   ° °Access:  Right internal jugular vein ° °Findings: Catheter tip positioned at cavoatrial junction. Port is ready for immediate use.  ° °Complications: None ° °EBL: < 10 mL ° °Recommendations:  °- Ok to shower in 24 hours °- Do not submerge for 7 days °- Routine line care  ° ° °Nikkol Pai, MD °Pager: [phone] ° ° ° °

## 2020-07-13 NOTE — H&P (Signed)
Chief Complaint: Patient was seen in consultation today for port placement at the request of Dorsey,John T IV  Referring Physician(s): Dorsey,John T IV  Supervising Physician: Dr. Serafina Royals  Patient Status: Bells  History of Present Illness: Alyssa Elliott is a 69 y.o. female with Lymphoma. She has started chemotherapy but is having trouble with venous access. Therefore she is referred to IR for port placement. PMHx, meds, labs, imaging, allergies reviewed. Feels well, no recent fevers, chills, illness. Has been NPO today as directed.   Past Medical History:  Diagnosis Date  . Allergy   . Anxiety   . Asthma   . Cataract 2018   removed  . Complication of anesthesia    drop in BP  . DDD (degenerative disc disease)   . Depression   . GERD (gastroesophageal reflux disease)   . Hemorrhoids   . Hyperlipidemia   . Lymphoma ([place]) dx 2021   no tx for now  . Vitamin D deficiency     Past Surgical History:  Procedure Laterality Date  . ATRIAL ABLATION SURGERY  1997   SVT  . cataract surgery Bilateral 2017  . COLONOSCOPY  2016  . FOOT NEUROMA SURGERY     multiple times bilaterally  . PLANTAR FASCIA RELEASE Left 1999 or 2000  . SHOULDER ARTHROSCOPY Left 04/19/2019   Procedure: LEFT SHOULDER MANIPULATION UNDER ANESTHESIA AND ARTHROSCOPY WITH EXTENSIVE DEBRIDEMENT;  Surgeon: Mcarthur Rossetti, MD;  Location: [facility];  Service: Orthopedics;  Laterality: Left;  . UPPER GI ENDOSCOPY  yrs ago    Allergies: Codeine, Penicillins, Latex, and Sulfa antibiotics  Medications: Prior to Admission medications   Medication Sig Start Date End Date Taking? Authorizing Provider  acetaminophen (TYLENOL) 500 MG tablet Take 1,000 mg by mouth every 6 (six) hours as needed for moderate pain or headache.    [provider]  acyclovir (ZOVIRAX) 400 MG tablet Take 1 tablet (400 mg total) by mouth 2 (two) times daily. 06/30/20   Orson Slick, MD  allopurinol  (ZYLOPRIM) 300 MG tablet Take 1 tablet (300 mg total) by mouth daily. 06/30/20   Orson Slick, MD  ARIPiprazole (ABILIFY) 2 MG tablet Take 2 mg by mouth daily.  01/01/19   [provider]  aspirin 81 MG tablet Take 81 mg by mouth at bedtime.     [provider]  [employer] FIBER GUMMIES PO Take 2 each by mouth daily.     [provider]  EPINEPHrine 0.3 mg/0.3 mL IJ SOAJ injection Inject 0.3 mg into the muscle as needed for anaphylaxis.  12/28/18   [provider]  fluticasone furoate-vilanterol (BREO ELLIPTA) 200-25 MCG/INH AEPB Inhale 1 puff into the lungs daily.    [provider]  gabapentin (NEURONTIN) 100 MG capsule Take by mouth. 02/28/20   [provider]  GLUCOSAMINE-CHONDROITIN-MSM PO Take 1 tablet by mouth 2 (two) times a day.    [provider]  levalbuterol (XOPENEX HFA) 45 MCG/ACT inhaler Inhale 1 puff into the lungs every 4 (four) hours as needed for wheezing or shortness of breath. 09/02/19   Rigoberto Noel, MD  lidocaine-prilocaine (EMLA) cream Apply 1 application topically as needed. 06/30/20   Orson Slick, MD  Melatonin 5 MG TABS Take 20 mg by mouth at bedtime.     [provider]  meloxicam (MOBIC) 15 MG tablet Take 15 mg by mouth daily as needed for pain.  Patient not taking: Reported on 04/03/2020  04/14/14   [provider]  NONFORMULARY OR COMPOUNDED ITEM Estradiol 0.02% Insert 1 gram vaginally 2 times per week. 3 month supply with 3 refills. Patient not taking: Reported on 04/03/2020 08/06/19   Salvadore Dom, MD  nystatin cream (MYCOSTATIN) Apply 1 application topically 2 (two) times daily. Apply to affected area BID for up to 7 days. 08/05/19   Salvadore Dom, MD  ondansetron (ZOFRAN) 8 MG tablet Take 1 tablet (8 mg total) by mouth every 8 (eight) hours as needed for nausea or vomiting. 06/30/20   Orson Slick, MD  pantoprazole (PROTONIX) 40 MG tablet Take 40 mg by mouth 2 (two)  times daily. 02/05/19   [provider]  PARoxetine (PAXIL) 40 MG tablet Take 40 mg by mouth daily.  05/24/15   [provider]  pravastatin (PRAVACHOL) 40 MG tablet Take 40 mg by mouth at bedtime.  04/25/11   [provider]  prochlorperazine (COMPAZINE) 10 MG tablet Take 1 tablet (10 mg total) by mouth every 6 (six) hours as needed for nausea or vomiting. 06/30/20   Orson Slick, MD  SINGULAIR 10 MG tablet Take 10 mg by mouth at bedtime.  04/08/11   [provider]  Vitamin D, Ergocalciferol, (DRISDOL) 50000 UNITS CAPS Take 50,000 Units by mouth every Monday.  04/25/11   [provider]     Family History  Problem Relation Age of Onset  . Heart disease Mother   . Diabetes Mother   . Tuberculosis Paternal Grandfather   . Bone cancer Paternal Grandfather   . Breast cancer Sister 31  . Melanoma Sister   . Cirrhosis Maternal Grandfather   . Heart failure Paternal Grandmother   . Colon cancer Neg Hx   . Ovarian cancer Neg Hx   . Uterine cancer Neg Hx     Social History   Socioeconomic History  . Marital status: Married    Spouse name: Not on file  . Number of children: 0  . Years of education: Not on file  . Highest education level: Not on file  Occupational History  . Occupation: retired Catering manager  Tobacco Use  . Smoking status: Never Smoker  . Smokeless tobacco: Never Used  Vaping Use  . Vaping Use: Never used  Substance and Sexual Activity  . Alcohol use: No  . Drug use: No  . Sexual activity: Not Currently    Partners: Male    Birth control/protection: Post-menopausal  Other Topics Concern  . Not on file  Social History Narrative  . Not on file   Social Determinants of Health   Financial Resource Strain:   . Difficulty of Paying Living Expenses: Not on file  Food Insecurity:   . Worried About Charity fundraiser in the Last Year: Not on file  . Ran Out of Food in the Last Year: Not on file  Transportation Needs:    . Lack of Transportation (Medical): Not on file  . Lack of Transportation (Non-Medical): Not on file  Physical Activity:   . Days of Exercise per Week: Not on file  . Minutes of Exercise per Session: Not on file  Stress:   . Feeling of Stress : Not on file  Social Connections:   . Frequency of Communication with Friends and Family: Not on file  . Frequency of Social Gatherings with Friends and Family: Not on file  . Attends Religious Services: Not on file  . Active Member of Clubs or  Organizations: Not on file  . Attends Archivist Meetings: Not on file  . Marital Status: Not on file     Review of Systems: A 12 point ROS discussed and pertinent positives are indicated in the HPI above.  All other systems are negative.  Review of Systems  Vital Signs: BP 116/72   Pulse 85   Temp 98.5 F (36.9 C) (Oral)   Resp 16   LMP 09/04/2002 (Exact Date)   SpO2 95%   Physical Exam Constitutional:      Appearance: Normal appearance.  HENT:     Mouth/Throat:     Mouth: Mucous membranes are moist.     Pharynx: Oropharynx is clear.  Cardiovascular:     Rate and Rhythm: Normal rate and regular rhythm.     Heart sounds: Normal heart sounds.  Pulmonary:     Effort: Pulmonary effort is normal. No respiratory distress.     Breath sounds: Normal breath sounds.  Skin:    General: Skin is warm and dry.  Neurological:     General: No focal deficit present.     Mental Status: She is alert and oriented to person, place, and time.  Psychiatric:        Mood and Affect: Mood normal.        Thought Content: Thought content normal.        Judgment: Judgment normal.     Imaging: CT CHEST ABDOMEN PELVIS W CONTRAST  Result Date: 06/25/2020 CLINICAL DATA:  Follow-up B-cell follicular lymphoma. EXAM: CT CHEST, ABDOMEN, AND PELVIS WITH CONTRAST TECHNIQUE: Multidetector CT imaging of the chest, abdomen and pelvis was performed following the standard protocol during bolus administration  of intravenous contrast. CONTRAST:  171mL OMNIPAQUE IOHEXOL 300 MG/ML  SOLN COMPARISON:  PET-CT on 03/30/2020 FINDINGS: CT CHEST FINDINGS Cardiovascular: No acute findings. Mediastinum/Lymph Nodes: 10 mm left axillary lymph node is seen on image 14/2, without significant change since previous study. No other pathologically enlarged lymph nodes identified. Lungs/Pleura: No pulmonary infiltrate or mass identified. No effusion present. Musculoskeletal:  No suspicious bone lesions identified. CT ABDOMEN AND PELVIS FINDINGS Hepatobiliary: No masses identified. Gallbladder is unremarkable. No evidence of biliary ductal dilatation. Pancreas:  No mass or inflammatory changes. Spleen:  Within normal limits in size and appearance. Adrenals/Urinary tract:  No masses or hydronephrosis. Stomach/Bowel: No evidence of obstruction, inflammatory process, or abnormal fluid collections. Vascular/Lymphatic: Mild mesenteric lymphadenopathy is seen with lymph nodes measuring up to 12 mm, without significant change since prior exam. No new or increased areas of lymphadenopathy identified. No abdominal aortic aneurysm. Reproductive: A soft tissue mass is seen involving the vagina which measures 6.5 x 5.7 cm on image 110/2. This shows increased size compared to 5.8 x 3.6 cm on prior exam. This mass now appears to involve the posterior wall of the urinary bladder. Adnexal regions are unremarkable. Other:  None. Musculoskeletal:  No suspicious bone lesions identified. IMPRESSION: Mild increase in size of soft tissue mass involving the vagina, which now appears to involve the posterior wall of urinary bladder. Stable mild mesenteric and left axillary lymphadenopathy. No new or progressive lymphadenopathy identified. Electronically Signed   By: Marlaine Hind M.D.   On: 06/25/2020 20:07   DG Bone Density  Result Date: 07/01/2020 EXAM: DUAL X-RAY ABSORPTIOMETRY (DXA) FOR BONE MINERAL DENSITY IMPRESSION: Referring Physician:  Salvadore Dom Your patient completed a BMD test using Lunar IDXA DXA system ( analysis version: 16 ) manufactured by [employer]. Technologist: AD PATIENT:  Name: Alyssa, Elliott Patient ID: [id number] Birth Date: 01/03/1951 Height: 63.5 in. Sex: Female Measured: 06/30/2020 Weight: 159.0 lbs. Indications: Advanced Age, Caucasian, Dexilant, Estrogen Deficient, Family Hist. (Parent hip fracture), Height Loss (781.91), History of Fracture (Adult) (V15.51), History of Osteopenia, Lexapro, Lymphoma, Pantoprazole, Paxil, Postmenopausal, scoliosis, Secondary Osteoporosis, Vitamin D Deficient Fractures: Toe Treatments: Vitamin D (E933.5) ASSESSMENT: The BMD measured at AP Spine L1-L2 is 0.929 g/cm2 with a T-score of -2.0. This patient is considered osteopenic according to [facility]) criteria. The scan quality is good. L-3 and L-4 were excluded due to degenerative changes. Site Region Measured Date Measured Age YA BMD Significant CHANGE T-score AP Spine  L1-L2      4/30/[age]    71.4         -2.0    0.929 g/cm2 AP Spine  L1-L2      08/31/2017    68.5         -1.9    0.949 g/cm2 DualFemur Neck Left  06/30/2020    71.4         -1.6    0.819 g/cm2 DualFemur Neck Left 08/31/2017 68.5 -1.3 0.854 g/cm2 * DualFemur Total Mean 06/30/2020    71.4         -1.1    0.873 g/cm2 DualFemur Total Mean 08/31/2017    68.5         -1.0    0.887 g/cm2 World Health Organization [facility]) criteria for post-menopausal, Caucasian Women: Normal       T-score at or above -1 SD Osteopenia   T-score between -1 and -2.5 SD Osteoporosis T-score at or below -2.5 SD RECOMMENDATION: 1. All patients should optimize calcium and vitamin D intake. 2. Consider FDA approved medical therapies in postmenopausal women and men aged 77 years and older, based on the following: a. A hip or vertebral (clinical or morphometric) fracture b. T- score < or = -2.5 at the femoral neck or spine after appropriate evaluation to exclude secondary causes c. Low bone  mass (T-score between -1.0 and -2.5 at the femoral neck or spine) and a 10 year probability of a hip fracture > or = 3% or a 10 year probability of a major osteoporosis-related fracture > or = 20% based on the [place]-adapted WHO algorithm d. Clinician judgment and/or patient preferences may indicate treatment for people with 10-year fracture probabilities above or below these levels FOLLOW-UP: Patients with diagnosis of osteoporosis or at high risk for fracture should have regular bone mineral density tests. For patients eligible for Medicare, routine testing is allowed once every 2 years. The testing frequency can be increased to one year for patients who have rapidly progressing disease, those who are receiving or discontinuing medical therapy to restore bone mass, or have additional risk factors. FRAX* 10-year Probability of Fracture Based on femoral neck BMD: DualFemur (Left) Major Osteoporotic Fracture: 24.9% Hip Fracture:                6.6% Population:                  [place] (Caucasian) Risk Factors: Family Hist. (Parent hip fracture), History of Fracture (Adult) (V15.51), Secondary Osteoporosis *FRAX is a [place] [employer] for Metabolic Bone Disease, a [place] (WHO) [facility]. ASSESSMENT: The probability of a major osteoporotic fracture is 24.9% within the next ten years. The probability of a hip fracture is 6.6% within the next ten years. Electronically Signed  By: Marlaine Hind M.D.   On: 07/01/2020 08:14    Labs:  CBC: Recent Labs    04/21/20 0756 04/29/20 0729 06/25/20 1402 07/06/20 0741  WBC 5.7 5.1 6.0 5.2  HGB 14.6 14.7 15.3* 14.8  HCT 45.0 44.9 45.9 44.4  PLT 222 236 233 225    COAGS: Recent Labs    04/21/20 0756  INR 1.0    BMP: Recent Labs    03/18/20 0912 03/18/20 0912 04/06/20 0855 04/29/20 0729 06/25/20 1402 07/06/20 0741  NA 143   < > 143 144 141 142  K 4.0   < > 3.9 4.2 4.3 3.9  CL 107   <  > 106 109 107 109  CO2 26   < > 27 26 29 24   GLUCOSE 91   < > 104* 98 89 102*  BUN 10   < > 9 11 10 12   CALCIUM 9.2   < > 9.1 9.4 9.4 9.2  CREATININE 0.85  --  0.81 0.78 0.76 0.78  GFRNONAA >60  --  >60 >60 >60 >60  GFRAA >60  --  >60 >60  --   --    < > = values in this interval not displayed.    LIVER FUNCTION TESTS: Recent Labs    03/18/20 0912 04/29/20 0729 06/25/20 1402 07/06/20 0741  BILITOT 0.4 0.5 0.4 0.4  AST 21 20 18 19   ALT 17 16 14 18   ALKPHOS 119 110 112 112  PROT 7.1 6.5 6.9 6.6  ALBUMIN 3.7 3.4* 3.6 3.4*    TUMOR MARKERS: No results for input(s): AFPTM, CEA, CA199, CHROMGRNA in the last 8760 hours.  Assessment and Plan: Lymphoma For port placement Risks and benefits of image guided port-a-catheter placement was discussed with the patient including, but not limited to bleeding, infection, pneumothorax, or fibrin sheath development and need for additional procedures.  All of the patient's questions were answered, patient is agreeable to proceed. Consent signed and in chart.    Thank you for this interesting consult.  I greatly enjoyed meeting Alyssa Elliott and look forward to participating in their care.  A copy of this report was sent to the requesting provider on this date.  Electronically Signed: Ascencion Dike, PA-C 07/13/2020, 10:33 AM   I spent a total of 20 minutes in face to face in clinical consultation, greater than 50% of which was counseling/coordinating care for port

## 2020-07-14 ENCOUNTER — Encounter: Payer: Self-pay | Admitting: Hematology and Oncology

## 2020-07-14 ENCOUNTER — Telehealth: Payer: Self-pay | Admitting: *Deleted

## 2020-07-14 NOTE — Telephone Encounter (Signed)
Received call from patient requesting to have her lab appt moved from 07/15/20 to 07/16/20. She had her port placed yesterday and would like a a couple of days to recover from that.  Scheduling message sent

## 2020-07-14 NOTE — Progress Notes (Signed)
Called pt to introduce myself as her Arboriculturist.  Pt has 2 insurances so copay assistance shouldn't be needed.  I left a msg requesting she return my call if she's interested in applying for the [employer].

## 2020-07-15 ENCOUNTER — Inpatient Hospital Stay: Payer: Medicare Other

## 2020-07-15 ENCOUNTER — Other Ambulatory Visit: Payer: Medicare Other

## 2020-07-16 ENCOUNTER — Other Ambulatory Visit: Payer: Self-pay | Admitting: *Deleted

## 2020-07-16 ENCOUNTER — Telehealth: Payer: Self-pay | Admitting: Emergency Medicine

## 2020-07-16 ENCOUNTER — Inpatient Hospital Stay: Payer: Medicare Other

## 2020-07-16 ENCOUNTER — Telehealth: Payer: Self-pay | Admitting: *Deleted

## 2020-07-16 ENCOUNTER — Inpatient Hospital Stay (HOSPITAL_BASED_OUTPATIENT_CLINIC_OR_DEPARTMENT_OTHER): Payer: Medicare Other | Admitting: Medical

## 2020-07-16 ENCOUNTER — Other Ambulatory Visit: Payer: Self-pay

## 2020-07-16 VITALS — BP 129/61 | HR 88 | Temp 99.5°F | Resp 18

## 2020-07-16 DIAGNOSIS — N390 Urinary tract infection, site not specified: Secondary | ICD-10-CM

## 2020-07-16 DIAGNOSIS — C8253 Diffuse follicle center lymphoma, intra-abdominal lymph nodes: Secondary | ICD-10-CM | POA: Diagnosis not present

## 2020-07-16 DIAGNOSIS — Z95828 Presence of other vascular implants and grafts: Secondary | ICD-10-CM

## 2020-07-16 DIAGNOSIS — R21 Rash and other nonspecific skin eruption: Secondary | ICD-10-CM | POA: Diagnosis not present

## 2020-07-16 DIAGNOSIS — J9811 Atelectasis: Secondary | ICD-10-CM | POA: Diagnosis not present

## 2020-07-16 DIAGNOSIS — Z20822 Contact with and (suspected) exposure to covid-19: Secondary | ICD-10-CM | POA: Diagnosis not present

## 2020-07-16 DIAGNOSIS — R300 Dysuria: Secondary | ICD-10-CM

## 2020-07-16 DIAGNOSIS — A419 Sepsis, unspecified organism: Secondary | ICD-10-CM | POA: Diagnosis not present

## 2020-07-16 DIAGNOSIS — R509 Fever, unspecified: Secondary | ICD-10-CM

## 2020-07-16 DIAGNOSIS — K521 Toxic gastroenteritis and colitis: Secondary | ICD-10-CM | POA: Diagnosis not present

## 2020-07-16 DIAGNOSIS — J90 Pleural effusion, not elsewhere classified: Secondary | ICD-10-CM | POA: Diagnosis not present

## 2020-07-16 DIAGNOSIS — A4150 Gram-negative sepsis, unspecified: Secondary | ICD-10-CM | POA: Diagnosis not present

## 2020-07-16 DIAGNOSIS — E876 Hypokalemia: Secondary | ICD-10-CM | POA: Diagnosis not present

## 2020-07-16 LAB — CMP (CANCER CENTER ONLY)
ALT: 28 U/L (ref 0–44)
AST: 38 U/L (ref 15–41)
Albumin: 3.2 g/dL — ABNORMAL LOW (ref 3.5–5.0)
Alkaline Phosphatase: 98 U/L (ref 38–126)
Anion gap: 6 (ref 5–15)
BUN: 8 mg/dL (ref 8–23)
CO2: 26 mmol/L (ref 22–32)
Calcium: 8.9 mg/dL (ref 8.9–10.3)
Chloride: 104 mmol/L (ref 98–111)
Creatinine: 0.84 mg/dL (ref 0.44–1.00)
GFR, Estimated: >60 mL/min (ref >60)
Glucose, Bld: 101 mg/dL — ABNORMAL HIGH (ref 70–99)
Potassium: 3.6 mmol/L (ref 3.5–5.1)
Sodium: 136 mmol/L (ref 135–145)
Total Bilirubin: 0.6 mg/dL (ref 0.3–1.2)
Total Protein: 6.3 g/dL — ABNORMAL LOW (ref 6.5–8.1)

## 2020-07-16 LAB — URINALYSIS, COMPLETE (UACMP) WITH MICROSCOPIC
Bilirubin Urine: NEGATIVE
Glucose, UA: NEGATIVE mg/dL
Hgb urine dipstick: NEGATIVE
Ketones, ur: NEGATIVE mg/dL
Nitrite: NEGATIVE
Protein, ur: NEGATIVE mg/dL
Specific Gravity, Urine: 1.015 (ref 1.005–1.030)
pH: 5 (ref 5.0–8.0)

## 2020-07-16 LAB — CBC WITH DIFFERENTIAL (CANCER CENTER ONLY)
Abs Immature Granulocytes: 0.05 10*3/uL (ref 0.00–0.07)
Basophils Absolute: 0 10*3/uL (ref 0.0–0.1)
Basophils Relative: 1 %
Eosinophils Absolute: 0.1 10*3/uL (ref 0.0–0.5)
Eosinophils Relative: 2 %
HCT: 40.3 % (ref 36.0–46.0)
Hemoglobin: 13.5 g/dL (ref 12.0–15.0)
Immature Granulocytes: 1 %
Lymphocytes Relative: 3 %
Lymphs Abs: 0.1 10*3/uL — ABNORMAL LOW (ref 0.7–4.0)
MCH: 30.9 pg (ref 26.0–34.0)
MCHC: 33.5 g/dL (ref 30.0–36.0)
MCV: 92.2 fL (ref 80.0–100.0)
Monocytes Absolute: 0.4 10*3/uL (ref 0.1–1.0)
Monocytes Relative: 9 %
Neutro Abs: 3.7 10*3/uL (ref 1.7–7.7)
Neutrophils Relative %: 84 %
Platelet Count: 144 10*3/uL — ABNORMAL LOW (ref 150–400)
RBC: 4.37 MIL/uL (ref 3.87–5.11)
RDW: 12.5 % (ref 11.5–15.5)
WBC Count: 4.4 10*3/uL (ref 4.0–10.5)
nRBC: 0 % (ref 0.0–0.2)

## 2020-07-16 LAB — LACTATE DEHYDROGENASE: LDH: 360 U/L — ABNORMAL HIGH (ref 98–192)

## 2020-07-16 MED ORDER — NITROFURANTOIN MONOHYD MACRO 100 MG PO CAPS: 100.0000 mg | ORAL_CAPSULE | Freq: Two times a day (BID) | ORAL | 0 refills | Status: DC

## 2020-07-16 MED ORDER — PHENAZOPYRIDINE HCL 100 MG PO TABS: 100.0000 mg | ORAL_TABLET | Freq: Three times a day (TID) | ORAL | 0 refills | Status: DC | PRN

## 2020-07-16 MED ORDER — CEPHALEXIN 500 MG PO CAPS: 500.0000 mg | ORAL_CAPSULE | Freq: Four times a day (QID) | ORAL | 0 refills | Status: DC

## 2020-07-16 MED ORDER — HEPARIN SOD (PORK) LOCK FLUSH 100 UNIT/ML IV SOLN
500.0000 [IU] | Freq: Once | INTRAVENOUS | Status: AC
  Administered 2020-07-16: 500 [IU] via INTRAVENOUS
  Filled 2020-07-16: qty 5

## 2020-07-16 MED ORDER — CIPROFLOXACIN HCL 500 MG PO TABS: 500.0000 mg | ORAL_TABLET | Freq: Two times a day (BID) | ORAL | 0 refills | Status: DC

## 2020-07-16 MED ORDER — SODIUM CHLORIDE 0.9% FLUSH
10.0000 mL | INTRAVENOUS | Status: DC | PRN
  Administered 2020-07-16: 10 mL via INTRAVENOUS
  Filled 2020-07-16: qty 10

## 2020-07-16 NOTE — Telephone Encounter (Signed)
Error

## 2020-07-16 NOTE — Telephone Encounter (Signed)
[name] patient's husband, Shevette Bess.  Advised that Dr. Lorenso Courier would like to change her antibiotic to Keflex 500 mg every 6 hours instead of the MacroBid. New prescription sent in. Spoke with Mr. Barnick and provided him with the above information. Advised that the pharmacist said he would take back the [place] and refund the money.  Mr. Rhett voiced understanding.

## 2020-07-16 NOTE — Telephone Encounter (Signed)
Saw patient in flush room while she was getting labs done per her new port. She states she does not feel well at all. Had fever of 101 this morning and has chills, no appetite and has frequency. Urgency with urination. C/o burning sensation with this as well. She looks like she does not feel well at all.  She is s/p her 1st cycle of Rituxan and Bendeka on 07/07/20 To be seen in [facility] after labs done. Order placed for u/a and culture.  Ruben Im, RN made aware as well as Lenox Ponds, LPN in  Flush room. Pt is agreeable.

## 2020-07-16 NOTE — Patient Instructions (Signed)
Urinary Tract Infection, Adult A urinary tract infection (UTI) is an infection of any part of the urinary tract. The urinary tract includes:  The kidneys.  The ureters.  The bladder.  The urethra. These organs make, store, and get rid of pee (urine) in the body. What are the causes? This is caused by germs (bacteria) in your genital area. These germs grow and cause swelling (inflammation) of your urinary tract. What increases the risk? You are more likely to develop this condition if:  You have a small, thin tube (catheter) to drain pee.  You cannot control when you pee or poop (incontinence).  You are female, and: ? You use these methods to prevent pregnancy:  A medicine that kills sperm (spermicide).  A device that blocks sperm (diaphragm). ? You have low levels of a female hormone (estrogen). ? You are pregnant.  You have genes that add to your risk.  You are sexually active.  You take antibiotic medicines.  You have trouble peeing because of: ? A prostate that is bigger than normal, if you are female. ? A blockage in the part of your body that drains pee from the bladder (urethra). ? A kidney stone. ? A nerve condition that affects your bladder (neurogenic bladder). ? Not getting enough to drink. ? Not peeing often enough.  You have other conditions, such as: ? Diabetes. ? A weak disease-fighting system (immune system). ? Sickle cell disease. ? Gout. ? Injury of the spine. What are the signs or symptoms? Symptoms of this condition include:  Needing to pee right away (urgently).  Peeing often.  Peeing small amounts often.  Pain or burning when peeing.  Blood in the pee.  Pee that smells bad or not like normal.  Trouble peeing.  Pee that is cloudy.  Fluid coming from the vagina, if you are female.  Pain in the belly or lower back. Other symptoms include:  Throwing up (vomiting).  No urge to eat.  Feeling mixed up (confused).  Being tired  and grouchy (irritable).  A fever.  Watery poop (diarrhea). How is this treated? This condition may be treated with:  Antibiotic medicine.  Other medicines.  Drinking enough water. Follow these instructions at home:  Medicines  Take over-the-counter and prescription medicines only as told by your doctor.  If you were prescribed an antibiotic medicine, take it as told by your doctor. Do not stop taking it even if you start to feel better. General instructions  Make sure you: ? Pee until your bladder is empty. ? Do not hold pee for a long time. ? Empty your bladder after sex. ? Wipe from front to back after pooping if you are a female. Use each tissue one time when you wipe.  Drink enough fluid to keep your pee pale yellow.  Keep all follow-up visits as told by your doctor. This is important. Contact a doctor if:  You do not get better after 1-2 days.  Your symptoms go away and then come back. Get help right away if:  You have very bad back pain.  You have very bad pain in your lower belly.  You have a fever.  You are sick to your stomach (nauseous).  You are throwing up. Summary  A urinary tract infection (UTI) is an infection of any part of the urinary tract.  This condition is caused by germs in your genital area.  There are many risk factors for a UTI. These include having a small, thin   tube to drain pee and not being able to control when you pee or poop.  Treatment includes antibiotic medicines for germs.  Drink enough fluid to keep your pee pale yellow. This information is not intended to replace advice given to you by your health care provider. Make sure you discuss any questions you have with your health care provider. Document Revised: 08/23/2018 Document Reviewed: 03/15/2018 Elsevier Patient Education  2020 [employer]  

## 2020-07-17 ENCOUNTER — Encounter (HOSPITAL_COMMUNITY): Payer: Self-pay | Admitting: Emergency Medicine

## 2020-07-17 ENCOUNTER — Telehealth: Payer: Self-pay | Admitting: *Deleted

## 2020-07-17 ENCOUNTER — Other Ambulatory Visit: Payer: Self-pay

## 2020-07-17 ENCOUNTER — Emergency Department (HOSPITAL_COMMUNITY): Payer: Medicare Other

## 2020-07-17 ENCOUNTER — Inpatient Hospital Stay (HOSPITAL_COMMUNITY)
Admission: EM | Admit: 2020-07-17 | Discharge: 2020-07-22 | DRG: 872 | Disposition: A | Discharge status: Home / Self Care | Payer: Medicare Other | Attending: Internal Medicine | Admitting: Internal Medicine

## 2020-07-17 DIAGNOSIS — K5730 Diverticulosis of large intestine without perforation or abscess without bleeding: Secondary | ICD-10-CM | POA: Diagnosis not present

## 2020-07-17 DIAGNOSIS — R32 Unspecified urinary incontinence: Secondary | ICD-10-CM | POA: Diagnosis present

## 2020-07-17 DIAGNOSIS — C8253 Diffuse follicle center lymphoma, intra-abdominal lymph nodes: Secondary | ICD-10-CM | POA: Diagnosis present

## 2020-07-17 DIAGNOSIS — J302 Other seasonal allergic rhinitis: Secondary | ICD-10-CM | POA: Diagnosis not present

## 2020-07-17 DIAGNOSIS — E785 Hyperlipidemia, unspecified: Secondary | ICD-10-CM | POA: Diagnosis present

## 2020-07-17 DIAGNOSIS — B9689 Other specified bacterial agents as the cause of diseases classified elsewhere: Secondary | ICD-10-CM | POA: Diagnosis present

## 2020-07-17 DIAGNOSIS — L270 Generalized skin eruption due to drugs and medicaments taken internally: Secondary | ICD-10-CM | POA: Diagnosis present

## 2020-07-17 DIAGNOSIS — F32A Depression, unspecified: Secondary | ICD-10-CM | POA: Diagnosis present

## 2020-07-17 DIAGNOSIS — J45909 Unspecified asthma, uncomplicated: Secondary | ICD-10-CM | POA: Diagnosis present

## 2020-07-17 DIAGNOSIS — R7989 Other specified abnormal findings of blood chemistry: Secondary | ICD-10-CM | POA: Diagnosis present

## 2020-07-17 DIAGNOSIS — J3089 Other allergic rhinitis: Secondary | ICD-10-CM | POA: Diagnosis not present

## 2020-07-17 DIAGNOSIS — N859 Noninflammatory disorder of uterus, unspecified: Secondary | ICD-10-CM | POA: Diagnosis present

## 2020-07-17 DIAGNOSIS — Z20822 Contact with and (suspected) exposure to covid-19: Secondary | ICD-10-CM | POA: Diagnosis present

## 2020-07-17 DIAGNOSIS — E876 Hypokalemia: Secondary | ICD-10-CM | POA: Diagnosis present

## 2020-07-17 DIAGNOSIS — T361X5A Adverse effect of cephalosporins and other beta-lactam antibiotics, initial encounter: Secondary | ICD-10-CM | POA: Diagnosis present

## 2020-07-17 DIAGNOSIS — K219 Gastro-esophageal reflux disease without esophagitis: Secondary | ICD-10-CM | POA: Diagnosis present

## 2020-07-17 DIAGNOSIS — Z885 Allergy status to narcotic agent status: Secondary | ICD-10-CM

## 2020-07-17 DIAGNOSIS — T451X5A Adverse effect of antineoplastic and immunosuppressive drugs, initial encounter: Secondary | ICD-10-CM | POA: Diagnosis present

## 2020-07-17 DIAGNOSIS — F419 Anxiety disorder, unspecified: Secondary | ICD-10-CM | POA: Diagnosis present

## 2020-07-17 DIAGNOSIS — R1110 Vomiting, unspecified: Secondary | ICD-10-CM | POA: Diagnosis present

## 2020-07-17 DIAGNOSIS — E559 Vitamin D deficiency, unspecified: Secondary | ICD-10-CM | POA: Diagnosis present

## 2020-07-17 DIAGNOSIS — J9811 Atelectasis: Secondary | ICD-10-CM | POA: Diagnosis not present

## 2020-07-17 DIAGNOSIS — Z881 Allergy status to other antibiotic agents status: Secondary | ICD-10-CM

## 2020-07-17 DIAGNOSIS — Z79890 Hormone replacement therapy (postmenopausal): Secondary | ICD-10-CM

## 2020-07-17 DIAGNOSIS — Z79899 Other long term (current) drug therapy: Secondary | ICD-10-CM | POA: Diagnosis not present

## 2020-07-17 DIAGNOSIS — Z880 Allergy status to penicillin: Secondary | ICD-10-CM | POA: Diagnosis not present

## 2020-07-17 DIAGNOSIS — Z882 Allergy status to sulfonamides status: Secondary | ICD-10-CM | POA: Diagnosis not present

## 2020-07-17 DIAGNOSIS — M5136 Other intervertebral disc degeneration, lumbar region: Secondary | ICD-10-CM | POA: Diagnosis not present

## 2020-07-17 DIAGNOSIS — A419 Sepsis, unspecified organism: Secondary | ICD-10-CM | POA: Diagnosis not present

## 2020-07-17 DIAGNOSIS — Z91040 Latex allergy status: Secondary | ICD-10-CM

## 2020-07-17 DIAGNOSIS — K521 Toxic gastroenteritis and colitis: Secondary | ICD-10-CM | POA: Diagnosis present

## 2020-07-17 DIAGNOSIS — R509 Fever, unspecified: Secondary | ICD-10-CM

## 2020-07-17 DIAGNOSIS — N390 Urinary tract infection, site not specified: Secondary | ICD-10-CM | POA: Diagnosis present

## 2020-07-17 DIAGNOSIS — A4150 Gram-negative sepsis, unspecified: Secondary | ICD-10-CM | POA: Diagnosis present

## 2020-07-17 DIAGNOSIS — J90 Pleural effusion, not elsewhere classified: Secondary | ICD-10-CM | POA: Diagnosis not present

## 2020-07-17 DIAGNOSIS — R21 Rash and other nonspecific skin eruption: Secondary | ICD-10-CM | POA: Diagnosis not present

## 2020-07-17 DIAGNOSIS — J42 Unspecified chronic bronchitis: Secondary | ICD-10-CM | POA: Diagnosis not present

## 2020-07-17 LAB — COMPREHENSIVE METABOLIC PANEL
ALT: 65 U/L — ABNORMAL HIGH (ref 0–44)
AST: 85 U/L — ABNORMAL HIGH (ref 15–41)
Albumin: 3.3 g/dL — ABNORMAL LOW (ref 3.5–5.0)
Alkaline Phosphatase: 90 U/L (ref 38–126)
Anion gap: 9 (ref 5–15)
BUN: 12 mg/dL (ref 8–23)
CO2: 24 mmol/L (ref 22–32)
Calcium: 8.2 mg/dL — ABNORMAL LOW (ref 8.9–10.3)
Chloride: 102 mmol/L (ref 98–111)
Creatinine, Ser: 0.9 mg/dL (ref 0.44–1.00)
GFR, Estimated: >60 mL/min (ref >60)
Glucose, Bld: 114 mg/dL — ABNORMAL HIGH (ref 70–99)
Potassium: 3.3 mmol/L — ABNORMAL LOW (ref 3.5–5.1)
Sodium: 135 mmol/L (ref 135–145)
Total Bilirubin: 0.6 mg/dL (ref 0.3–1.2)
Total Protein: 6.4 g/dL — ABNORMAL LOW (ref 6.5–8.1)

## 2020-07-17 LAB — URINALYSIS, ROUTINE W REFLEX MICROSCOPIC
Bacteria, UA: NONE SEEN
Bilirubin Urine: NEGATIVE
Glucose, UA: NEGATIVE mg/dL
Hgb urine dipstick: NEGATIVE
Ketones, ur: NEGATIVE mg/dL
Nitrite: NEGATIVE
Protein, ur: NEGATIVE mg/dL
Specific Gravity, Urine: 1.018 (ref 1.005–1.030)
pH: 5 (ref 5.0–8.0)

## 2020-07-17 LAB — CBC WITH DIFFERENTIAL/PLATELET
Abs Immature Granulocytes: 0.05 10*3/uL (ref 0.00–0.07)
Basophils Absolute: 0 10*3/uL (ref 0.0–0.1)
Basophils Relative: 1 %
Eosinophils Absolute: 0.1 10*3/uL (ref 0.0–0.5)
Eosinophils Relative: 2 %
HCT: 40.3 % (ref 36.0–46.0)
Hemoglobin: 13.4 g/dL (ref 12.0–15.0)
Immature Granulocytes: 1 %
Lymphocytes Relative: 2 %
Lymphs Abs: 0.1 10*3/uL — ABNORMAL LOW (ref 0.7–4.0)
MCH: 31.1 pg (ref 26.0–34.0)
MCHC: 33.3 g/dL (ref 30.0–36.0)
MCV: 93.5 fL (ref 80.0–100.0)
Monocytes Absolute: 0.3 10*3/uL (ref 0.1–1.0)
Monocytes Relative: 6 %
Neutro Abs: 3.8 10*3/uL (ref 1.7–7.7)
Neutrophils Relative %: 88 %
Platelets: 119 10*3/uL — ABNORMAL LOW (ref 150–400)
RBC: 4.31 MIL/uL (ref 3.87–5.11)
RDW: 12.9 % (ref 11.5–15.5)
WBC: 4.4 10*3/uL (ref 4.0–10.5)
nRBC: 0 % (ref 0.0–0.2)

## 2020-07-17 LAB — APTT: aPTT: 30 seconds (ref 24–36)

## 2020-07-17 LAB — RESPIRATORY PANEL BY RT PCR (FLU A&B, COVID)
Influenza A by PCR: NEGATIVE
Influenza B by PCR: NEGATIVE
SARS Coronavirus 2 by RT PCR: NEGATIVE

## 2020-07-17 LAB — URINE CULTURE: Culture: <10000 — AB

## 2020-07-17 LAB — LACTIC ACID, PLASMA: Lactic Acid, Venous: 1.1 mmol/L (ref 0.5–1.9)

## 2020-07-17 LAB — PROTIME-INR
INR: 1.1 (ref 0.8–1.2)
Prothrombin Time: 14.2 seconds (ref 11.4–15.2)

## 2020-07-17 MED ORDER — SODIUM CHLORIDE 0.9 % IV SOLN
2.0000 g | Freq: Once | INTRAVENOUS | Status: AC
  Administered 2020-07-17: 2 g via INTRAVENOUS
  Filled 2020-07-17: qty 2

## 2020-07-17 MED ORDER — CHLORHEXIDINE GLUCONATE CLOTH 2 % EX PADS
6.0000 | MEDICATED_PAD | Freq: Every day | CUTANEOUS | Status: DC
  Administered 2020-07-18 – 2020-07-22 (×5): 6 via TOPICAL

## 2020-07-17 MED ORDER — SODIUM CHLORIDE 0.9% FLUSH: 10.0000 mL | INTRAVENOUS | Status: DC | PRN

## 2020-07-17 MED ORDER — DIPHENHYDRAMINE HCL 50 MG/ML IJ SOLN
25.0000 mg | Freq: Once | INTRAMUSCULAR | Status: AC
  Administered 2020-07-17: 25 mg via INTRAVENOUS
  Filled 2020-07-17: qty 1

## 2020-07-17 MED ORDER — SODIUM CHLORIDE 0.9 % IV SOLN: 2.0000 g | Freq: Two times a day (BID) | INTRAVENOUS | Status: DC

## 2020-07-17 MED ORDER — POTASSIUM CHLORIDE 10 MEQ/100ML IV SOLN
10.0000 meq | INTRAVENOUS | Status: DC
  Administered 2020-07-17 (×3): 10 meq via INTRAVENOUS
  Filled 2020-07-17 (×3): qty 100

## 2020-07-17 MED ORDER — LACTATED RINGERS IV SOLN: INTRAVENOUS | Status: AC

## 2020-07-17 MED ORDER — LACTATED RINGERS IV BOLUS (SEPSIS)
1000.0000 mL | Freq: Once | INTRAVENOUS | Status: AC
  Administered 2020-07-17: 1000 mL via INTRAVENOUS

## 2020-07-17 MED ORDER — VANCOMYCIN HCL 500 MG/100ML IV SOLN
500.0000 mg | Freq: Once | INTRAVENOUS | Status: AC
  Administered 2020-07-18: 500 mg via INTRAVENOUS
  Filled 2020-07-17: qty 100

## 2020-07-17 MED ORDER — METRONIDAZOLE IN NACL 5-0.79 MG/ML-% IV SOLN
500.0000 mg | Freq: Once | INTRAVENOUS | Status: AC
  Administered 2020-07-17: 500 mg via INTRAVENOUS
  Filled 2020-07-17: qty 100

## 2020-07-17 MED ORDER — ACETAMINOPHEN 500 MG PO TABS
1000.0000 mg | ORAL_TABLET | Freq: Once | ORAL | Status: AC
  Administered 2020-07-17: 1000 mg via ORAL
  Filled 2020-07-17: qty 2

## 2020-07-17 MED ORDER — METHYLPREDNISOLONE SODIUM SUCC 125 MG IJ SOLR
60.0000 mg | Freq: Two times a day (BID) | INTRAMUSCULAR | Status: DC
  Administered 2020-07-17 – 2020-07-18 (×2): 60 mg via INTRAVENOUS
  Filled 2020-07-17 (×2): qty 2

## 2020-07-17 MED ORDER — DIPHENHYDRAMINE HCL 50 MG/ML IJ SOLN: 12.5000 mg | Freq: Three times a day (TID) | INTRAMUSCULAR | Status: DC | PRN

## 2020-07-17 MED ORDER — VANCOMYCIN HCL IN DEXTROSE 1-5 GM/200ML-% IV SOLN
1000.0000 mg | Freq: Once | INTRAVENOUS | Status: AC
  Administered 2020-07-17: 1000 mg via INTRAVENOUS
  Filled 2020-07-17: qty 200

## 2020-07-17 MED ORDER — SODIUM CHLORIDE 0.9 % IV SOLN
2.0000 g | Freq: Three times a day (TID) | INTRAVENOUS | Status: DC
  Administered 2020-07-18 – 2020-07-22 (×14): 2 g via INTRAVENOUS
  Filled 2020-07-17 (×15): qty 2

## 2020-07-17 MED ORDER — LACTATED RINGERS IV BOLUS (SEPSIS)
250.0000 mL | Freq: Once | INTRAVENOUS | Status: AC
  Administered 2020-07-17: 250 mL via INTRAVENOUS

## 2020-07-17 MED ORDER — SODIUM CHLORIDE 0.9% FLUSH
10.0000 mL | Freq: Two times a day (BID) | INTRAVENOUS | Status: DC
  Administered 2020-07-18 – 2020-07-22 (×5): 10 mL

## 2020-07-17 MED ORDER — VANCOMYCIN HCL 1250 MG/250ML IV SOLN: 1250.0000 mg | INTRAVENOUS | Status: DC

## 2020-07-17 NOTE — ED Provider Notes (Signed)
[facility] DEPT Provider Note   CSN: [id number] Arrival date & time: 07/17/20  1639     History Chief Complaint  Patient presents with  . Fever    Alyssa Elliott is a 69 y.o. female w/ hx of nonhodgkin B cell lymphoma (stage III), oncologist Dr Lorenso Courier, on chemotherapy presenting to ED with fever and chills for 2 days.  She reports her last round of IV chemo was 2-3 weeks ago.  For the past 2 days she's had shaking chills, fever at home, diarrhea today, and nausea.  She denies vomiting, CP, cough, or SOB.    She reports she received both covid vaccines.  Patient had a UA yesterday with small leuks, rare bacteria, 20-50 WBC.  She was started on keflex 500 mg every 6 hours and has been taking it.  She reports ongoing dysuria.  Her urine culture grew < 10K mixed flora.  She reports allergies to Penicillins and Sulfa drugs ("rash").  Her right chest wall lumen port was placed on 07/13/20 by IR team.  Per her oncology note she is on 28 day cycle of Rituximab and Bendamustine   HPI     Past Medical History:  Diagnosis Date  . Allergy   . Anxiety   . Asthma   . Cataract 2018   removed  . Complication of anesthesia    drop in BP  . DDD (degenerative disc disease)   . Depression   . GERD (gastroesophageal reflux disease)   . Hemorrhoids   . Hyperlipidemia   . Lymphoma ([place]) dx 2021   no tx for now  . Vitamin D deficiency     Patient Active Problem List   Diagnosis Date Noted  . Sepsis ([place]) 07/17/2020  . 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 Abnormal positron emission tomography (PET) scan 04/03/2020  . Bronchiectasis without complication ([place]) 51/70/0174  . Bronchiectasis with acute exacerbation ([employer]) 05/21/2019  . Healthcare maintenance 05/21/2019  . Chronic left shoulder pain 04/01/2019  . Adhesive capsulitis of left shoulder 04/01/2019  . Lung nodule seen on imaging study  11/12/2012  . Maxillary sinusitis, chronic 10/27/2012  . Hemorrhoids 05/04/2012  . Thrombosed external hemorrhoid 05/04/2012  . Seasonal and perennial allergic rhinitis 10/16/2011  . Asthma with bronchitis 10/16/2011  . GERD (gastroesophageal reflux disease) 10/16/2011    Past Surgical History:  Procedure Laterality Date  . ATRIAL ABLATION SURGERY  1997   SVT  . cataract surgery Bilateral 2017  . COLONOSCOPY  2016  . FOOT NEUROMA SURGERY     multiple times bilaterally  . IR IMAGING GUIDED PORT INSERTION  07/13/2020  . PLANTAR FASCIA RELEASE Left 1999 or 2000  . SHOULDER ARTHROSCOPY Left 04/19/2019   Procedure: LEFT SHOULDER MANIPULATION UNDER ANESTHESIA AND ARTHROSCOPY WITH EXTENSIVE DEBRIDEMENT;  Surgeon: Mcarthur Rossetti, MD;  Location: [facility];  Service: Orthopedics;  Laterality: Left;  . UPPER GI ENDOSCOPY  yrs ago     OB History    Gravida  0   Para  0   Term  0   Preterm  0   AB  0   Living  0     SAB  0   TAB  0   Ectopic  0   Multiple  0   Live Births  0           Family History  Problem Relation Age of Onset  . Heart disease Mother   . Diabetes  Mother   . Tuberculosis Paternal Grandfather   . Bone cancer Paternal Grandfather   . Breast cancer Sister 35  . Melanoma Sister   . Cirrhosis Maternal Grandfather   . Heart failure Paternal Grandmother   . Colon cancer Neg Hx   . Ovarian cancer Neg Hx   . Uterine cancer Neg Hx     Social History   Tobacco Use  . Smoking status: Never Smoker  . Smokeless tobacco: Never Used  Vaping Use  . Vaping Use: Never used  Substance Use Topics  . Alcohol use: No  . Drug use: No    Home Medications Prior to Admission medications   Medication Sig Start Date End Date Taking? Authorizing Provider  acetaminophen (TYLENOL) 500 MG tablet Take 1,000 mg by mouth every 6 (six) hours as needed for moderate pain or headache.   Yes [provider]  acyclovir (ZOVIRAX) 400 MG tablet Take 1  tablet (400 mg total) by mouth 2 (two) times daily. 06/30/20  Yes Orson Slick, MD  allopurinol (ZYLOPRIM) 300 MG tablet Take 1 tablet (300 mg total) by mouth daily. 06/30/20  Yes Orson Slick, MD  ARIPiprazole (ABILIFY) 2 MG tablet Take 2 mg by mouth daily.  01/01/19  Yes [provider]  aspirin 81 MG tablet Take 81 mg by mouth at bedtime.    Yes [provider]  [employer] FIBER GUMMIES PO Take 2 each by mouth daily.    Yes [provider]  GLUCOSAMINE-CHONDROITIN-MSM PO Take 1 tablet by mouth 2 (two) times a day.   Yes [provider]  Melatonin 5 MG TABS Take 20 mg by mouth at bedtime.    Yes [provider]  ondansetron (ZOFRAN) 8 MG tablet Take 1 tablet (8 mg total) by mouth every 8 (eight) hours as needed for nausea or vomiting. 06/30/20  Yes Orson Slick, MD  pantoprazole (PROTONIX) 40 MG tablet Take 40 mg by mouth 2 (two) times daily. 02/05/19  Yes [provider]  PARoxetine (PAXIL) 40 MG tablet Take 40 mg by mouth daily.  05/24/15  Yes [provider]  phenazopyridine (PYRIDIUM) 100 MG tablet Take 1 tablet (100 mg total) by mouth 3 (three) times daily as needed for pain. 07/16/20  Yes Tanner, Lyndon Code., PA-C  pravastatin (PRAVACHOL) 40 MG tablet Take 40 mg by mouth at bedtime.  04/25/11  Yes [provider]  prochlorperazine (COMPAZINE) 10 MG tablet Take 1 tablet (10 mg total) by mouth every 6 (six) hours as needed for nausea or vomiting. 06/30/20  Yes Orson Slick, MD  SINGULAIR 10 MG tablet Take 10 mg by mouth at bedtime.  04/08/11  Yes [provider]  Vitamin D, Ergocalciferol, (DRISDOL) 50000 UNITS CAPS Take 50,000 Units by mouth every Monday.  04/25/11  Yes [provider]  cephALEXin (KEFLEX) 500 MG capsule Take 1 capsule (500 mg total) by mouth 4 (four) times daily for 7 days. Patient not taking: Reported on 07/17/2020 07/16/20 07/23/20  Ledell Peoples IV, MD  EPINEPHrine 0.3 mg/0.3 mL IJ  SOAJ injection Inject 0.3 mg into the muscle as needed for anaphylaxis.  12/28/18   [provider]  levalbuterol (XOPENEX HFA) 45 MCG/ACT inhaler Inhale 1 puff into the lungs every 4 (four) hours as needed for wheezing or shortness of breath. Patient not taking: Reported on 07/17/2020 09/02/19   Rigoberto Noel, MD  lidocaine-prilocaine (EMLA) cream Apply 1 application topically as needed. Patient  not taking: Reported on 07/17/2020 06/30/20   Orson Slick, MD  NONFORMULARY OR COMPOUNDED ITEM Estradiol 0.02% Insert 1 gram vaginally 2 times per week. 3 month supply with 3 refills. Patient not taking: Reported on 07/17/2020 08/06/19   Salvadore Dom, MD  nystatin cream (MYCOSTATIN) Apply 1 application topically 2 (two) times daily. Apply to affected area BID for up to 7 days. Patient not taking: Reported on 07/17/2020 08/05/19   Salvadore Dom, MD    Allergies    Codeine, Penicillins, Keflex [cephalexin], Latex, and Sulfa antibiotics  Review of Systems   Review of Systems  Constitutional: Positive for chills and fever.  HENT: Negative for ear pain and sore throat.   Eyes: Negative for pain and visual disturbance.  Respiratory: Negative for cough and shortness of breath.   Cardiovascular: Negative for chest pain and palpitations.  Gastrointestinal: Positive for nausea. Negative for abdominal pain and vomiting.  Genitourinary: Positive for dysuria. Negative for hematuria.  Musculoskeletal: Negative for arthralgias and back pain.  Skin: Negative for color change and rash.  Neurological: Negative for syncope and headaches.  All other systems reviewed and are negative.   Physical Exam Updated Vital Signs BP 106/63 (BP Location: Left Arm)   Pulse 84   Temp 98.6 F (37 C)   Resp 16   Ht 5\' 4"  (1.626 m)   Wt 68 kg   LMP 09/04/2002 (Exact Date)   SpO2 95%   BMI 25.75 kg/m   Physical Exam Vitals and nursing note reviewed.  Constitutional:      General: She is  not in acute distress.    Appearance: She is well-developed.  HENT:     Head: Normocephalic and atraumatic.  Eyes:     Conjunctiva/sclera: Conjunctivae normal.  Cardiovascular:     Rate and Rhythm: Normal rate and regular rhythm.     Pulses: Normal pulses.  Pulmonary:     Effort: Pulmonary effort is normal. No respiratory distress.  Musculoskeletal:     Cervical back: Neck supple.  Skin:    General: Skin is warm and dry.     Comments: Right anterior chest wall port site clean, intact  Neurological:     General: No focal deficit present.     Mental Status: She is alert and oriented to person, place, and time.  Psychiatric:        Mood and Affect: Mood normal.        Behavior: Behavior normal.     ED Results / Procedures / Treatments   Labs (all labs ordered are listed, but only abnormal results are displayed) Labs Reviewed  COMPREHENSIVE METABOLIC PANEL - Abnormal; Notable for the following components:      Result Value   Potassium 3.3 (*)    Glucose, Bld 114 (*)    Calcium 8.2 (*)    Total Protein 6.4 (*)    Albumin 3.3 (*)    AST 85 (*)    ALT 65 (*)    All other components within normal limits  CBC WITH DIFFERENTIAL/PLATELET - Abnormal; Notable for the following components:   Platelets 119 (*)    Lymphs Abs 0.1 (*)    All other components within normal limits  URINALYSIS, ROUTINE W REFLEX MICROSCOPIC - Abnormal; Notable for the following components:   Leukocytes,Ua TRACE (*)    All other components within normal limits  RESPIRATORY PANEL BY RT PCR (FLU A&B, COVID)  CULTURE, BLOOD (ROUTINE X 2)  CULTURE, BLOOD (ROUTINE X 2)  URINE CULTURE  LACTIC ACID, PLASMA  PROTIME-INR  APTT  LACTIC ACID, PLASMA    EKG None  Radiology DG Chest 2 View  Result Date: 07/17/2020 CLINICAL DATA:  69 year old female with suspected sepsis. EXAM: CHEST - 2 VIEW COMPARISON:  Chest radiograph dated 09/02/2019. FINDINGS: Right-sided Port-A-Cath with tip at the cavoatrial  junction. There are bibasilar linear atelectasis. No focal consolidation, pleural effusion, or pneumothorax. There is background of mild chronic bronchitic changes. The cardiac silhouette is within limits. No acute osseous pathology. IMPRESSION: No active cardiopulmonary disease. Electronically Signed   By: Anner Crete M.D.   On: 07/17/2020 17:08    Procedures .Critical Care Performed by: Wyvonnia Dusky, MD Authorized by: Wyvonnia Dusky, MD   Critical care provider statement:    Critical care time (minutes):  45   Critical care was necessary to treat or prevent imminent or life-threatening deterioration of the following conditions:  Sepsis   Critical care was time spent personally by me on the following activities:  Discussions with consultants, evaluation of patient's response to treatment, examination of patient, ordering and performing treatments and interventions, ordering and review of laboratory studies, ordering and review of radiographic studies, pulse oximetry, re-evaluation of patient's condition, obtaining history from patient or surrogate and review of old charts   (including critical care time)  Medications Ordered in ED Medications  lactated ringers infusion ( Intravenous New Bag/Given 07/17/20 1806)  sodium chloride flush (NS) 0.9 % injection 10-40 mL (has no administration in time range)  sodium chloride flush (NS) 0.9 % injection 10-40 mL (has no administration in time range)  Chlorhexidine Gluconate Cloth 2 % PADS 6 each (has no administration in time range)  potassium chloride 10 mEq in 100 mL IVPB (10 mEq Intravenous New Bag/Given 07/17/20 2215)  vancomycin (VANCOREADY) IVPB 500 mg/100 mL (has no administration in time range)  aztreonam (AZACTAM) 2 g in sodium chloride 0.9 % 100 mL IVPB (has no administration in time range)  vancomycin (VANCOREADY) IVPB 1250 mg/250 mL (has no administration in time range)  aztreonam (AZACTAM) 2 g in sodium chloride 0.9 % 100 mL  IVPB (0 g Intravenous Stopped 07/17/20 1909)  metroNIDAZOLE (FLAGYL) IVPB 500 mg (0 mg Intravenous Stopped 07/17/20 2025)  vancomycin (VANCOCIN) IVPB 1000 mg/200 mL premix (0 mg Intravenous Stopped 07/17/20 2210)  lactated ringers bolus 1,000 mL (0 mLs Intravenous Stopped 07/17/20 2025)    And  lactated ringers bolus 1,000 mL (0 mLs Intravenous Stopped 07/17/20 2025)    And  lactated ringers bolus 250 mL (0 mLs Intravenous Stopped 07/17/20 2025)  diphenhydrAMINE (BENADRYL) injection 25 mg (25 mg Intravenous Given 07/17/20 1754)  acetaminophen (TYLENOL) tablet 1,000 mg (1,000 mg Oral Given 07/17/20 2035)    ED Course  I have reviewed the triage vital signs and the nursing notes.  Pertinent labs & imaging results that were available during my care of the patient were reviewed by me and considered in my medical decision making (see chart for details).  This patient complains of fevers and chills .  This involves an extensive number of treatment options, and is a complaint that carries with it a high risk of complications and morbidity.  The differential diagnosis includes sepsis/infection vs chemo side effect vs other  She had a chest port placed 4 days ago, which may also be a nidus of infection  Unclear if this is a side effect of her new onset of chemo therapy, given her last session was reportedly > 1 week  ago.  I ordered, reviewed, and interpreted labs, which included CMP, CBC with diff, UA (no evident UTI).  WBC 4.4.  Lactate 1.1.  Covid negative.  Flu negative. I ordered medication IV vancomycin, IV aztreonam, IV fluids (sepsis bolus), PO tylenol for infection and fever and sepsis I ordered imaging studies which included dg chest I independently visualized and interpreted imaging which showed no evident PNA and the monitor tracing which showed NSR Previous records obtained and reviewed showing chemo regimen  After the interventions stated above, I reevaluated the patient and found  vitals stable. She continued having rigors.      Clinical Course as of Jul 17 2257  Fri Jul 17, 2020  2050 No clear source of infection aside from possible line infection (port placed last week).  She continues to have rigors and feeling poorly.  Plan to admit for blood culture clearance   [MT]    Clinical Course User Index [MT] Lorilei Horan, Carola Rhine, MD    Final Clinical Impression(s) / ED Diagnoses Final diagnoses:  Sepsis, due to unspecified organism, unspecified whether acute organ dysfunction present [facility])    Rx / DC Orders ED Discharge Orders    None       Wyvonnia Dusky, MD 07/17/20 2258

## 2020-07-17 NOTE — ED Triage Notes (Signed)
Patient has had a fever on and off since yesterday, highest at home was 103.9. Endorses diarrhea today, denies N/V, SOB or CP.

## 2020-07-17 NOTE — H&P (Signed)
TRH H&P    Patient Demographics:    Alyssa Elliott, is a 69 y.o. female  MRN: [id number]  DOB - [birth year]  Admit Date - 07/17/2020  Referring MD/NP/PA: Langston Masker  Outpatient Primary MD for the patient is Avva, Steva Ready, MD  Patient coming from: Home  Chief complaint- fevers   HPI:    Alyssa Elliott  is a 69 y.o. female,hx of vit D def, Lymphoma (with port placed 4 days ago), GERD, HLD, Depression, Asthma, anxiety and more presents to the ED with a chief complaint of fevers. Patient reports that she has been spiking fevers with a Tmax of 103.5 at home for the last two days. Tylenol has only offered temporary relief. She has had associated chills as well. She reports nausea, but no emesis. She has had one loose stool - non bloody, no melena, and no mucous. She started treatment for UTI yesterday with Cephalexin. Patient had an early childhood Penicillin allergy, and she noticed an erythematous rash develop on her back after she started taking the medication. She reports that she took a total of 4 doses. Patient reports that she is no longer having dysuria, but she is having urgency and some incontinence, which are both new to her. Patient reports no cough, no dyspnea. She has no skin breakdown, and no drainage from ears, eyes, or nose. She did recently have a right chest port placed. She reports that she not had any redness around the site, and has not noticed any drainage. She is fully vaxed for covid. Patient has no other complaints.  Non smoker, no EtOH, No illicit drugs. Full Code.  Port placed because patient recently started chemo for lymphoma.   In the ED T 103, down to 101 after tylenol, P 96, R 18, BP 129/48, 95% on room air WBC 4.4, hgb 13.4 Chem panel shows hypokalemia at 3.3 Negative flu and covid AST and ALT minimally elevated at 85 and 65 Lactic acid 1.1 UA borderline, urine culture  pending Blood cxs pending CXR = no active disease Vanco and aztreonam started LR 2.25L bolus LR 112ml/hr Benadryl given     Review of systems:    In addition to the HPI above,  Positive for fevers, No Headache, No changes with Vision or hearing, No problems swallowing food or Liquids, No Chest pain, Cough or Shortness of Breath, No Abdominal pain,  Nausea but no Vomiting, one loose stool today No Blood in stool or Urine, Positive for dysuria, urgency, and incontinence Rash on back, no bruises, No new joints pains-aches,  No new weakness, tingling, numbness in any extremity, No recent weight gain or loss, No polyuria, polydypsia or polyphagia, Recently diagnosed with Lymphoma, mental stressor  All other systems reviewed and are negative.    Past History of the following :    Past Medical History:  Diagnosis Date  . Allergy   . Anxiety   . Asthma   . Cataract 2018   removed  . Complication of anesthesia    drop in BP  . DDD (degenerative disc disease)   .  Depression   . GERD (gastroesophageal reflux disease)   . Hemorrhoids   . Hyperlipidemia   . Lymphoma ([place]) dx 2021   no tx for now  . Vitamin D deficiency       Past Surgical History:  Procedure Laterality Date  . ATRIAL ABLATION SURGERY  1997   SVT  . cataract surgery Bilateral 2017  . COLONOSCOPY  2016  . FOOT NEUROMA SURGERY     multiple times bilaterally  . IR IMAGING GUIDED PORT INSERTION  07/13/2020  . PLANTAR FASCIA RELEASE Left 1999 or 2000  . SHOULDER ARTHROSCOPY Left 04/19/2019   Procedure: LEFT SHOULDER MANIPULATION UNDER ANESTHESIA AND ARTHROSCOPY WITH EXTENSIVE DEBRIDEMENT;  Surgeon: Mcarthur Rossetti, MD;  Location: [facility];  Service: Orthopedics;  Laterality: Left;  . UPPER GI ENDOSCOPY  yrs ago      Social History:      Social History   Tobacco Use  . Smoking status: Never Smoker  . Smokeless tobacco: Never Used  Substance Use Topics  . Alcohol use: No       Family  History :     Family History  Problem Relation Age of Onset  . Heart disease Mother   . Diabetes Mother   . Tuberculosis Paternal Grandfather   . Bone cancer Paternal Grandfather   . Breast cancer Sister 55  . Melanoma Sister   . Cirrhosis Maternal Grandfather   . Heart failure Paternal Grandmother   . Colon cancer Neg Hx   . Ovarian cancer Neg Hx   . Uterine cancer Neg Hx       Home Medications:   Prior to Admission medications   Medication Sig Start Date End Date Taking? Authorizing Provider  acetaminophen (TYLENOL) 500 MG tablet Take 1,000 mg by mouth every 6 (six) hours as needed for moderate pain or headache.   Yes [provider]  acyclovir (ZOVIRAX) 400 MG tablet Take 1 tablet (400 mg total) by mouth 2 (two) times daily. 06/30/20  Yes Orson Slick, MD  allopurinol (ZYLOPRIM) 300 MG tablet Take 1 tablet (300 mg total) by mouth daily. 06/30/20  Yes Orson Slick, MD  ARIPiprazole (ABILIFY) 2 MG tablet Take 2 mg by mouth daily.  01/01/19  Yes [provider]  aspirin 81 MG tablet Take 81 mg by mouth at bedtime.    Yes [provider]  [employer] FIBER GUMMIES PO Take 2 each by mouth daily.    Yes [provider]  GLUCOSAMINE-CHONDROITIN-MSM PO Take 1 tablet by mouth 2 (two) times a day.   Yes [provider]  Melatonin 5 MG TABS Take 20 mg by mouth at bedtime.    Yes [provider]  ondansetron (ZOFRAN) 8 MG tablet Take 1 tablet (8 mg total) by mouth every 8 (eight) hours as needed for nausea or vomiting. 06/30/20  Yes Orson Slick, MD  pantoprazole (PROTONIX) 40 MG tablet Take 40 mg by mouth 2 (two) times daily. 02/05/19  Yes [provider]  PARoxetine (PAXIL) 40 MG tablet Take 40 mg by mouth daily.  05/24/15  Yes [provider]  phenazopyridine (PYRIDIUM) 100 MG tablet Take 1 tablet (100 mg total) by mouth 3 (three) times daily as needed for pain. 07/16/20  Yes Tanner, Lyndon Code., PA-C  pravastatin  (PRAVACHOL) 40 MG tablet Take 40 mg by mouth at bedtime.  04/25/11  Yes [provider]  prochlorperazine (COMPAZINE) 10 MG tablet Take 1 tablet (10 mg total)  by mouth every 6 (six) hours as needed for nausea or vomiting. 06/30/20  Yes Orson Slick, MD  SINGULAIR 10 MG tablet Take 10 mg by mouth at bedtime.  04/08/11  Yes [provider]  Vitamin D, Ergocalciferol, (DRISDOL) 50000 UNITS CAPS Take 50,000 Units by mouth every Monday.  04/25/11  Yes [provider]  cephALEXin (KEFLEX) 500 MG capsule Take 1 capsule (500 mg total) by mouth 4 (four) times daily for 7 days. Patient not taking: Reported on 07/17/2020 07/16/20 07/23/20  Ledell Peoples IV, MD  EPINEPHrine 0.3 mg/0.3 mL IJ SOAJ injection Inject 0.3 mg into the muscle as needed for anaphylaxis.  12/28/18   [provider]  levalbuterol (XOPENEX HFA) 45 MCG/ACT inhaler Inhale 1 puff into the lungs every 4 (four) hours as needed for wheezing or shortness of breath. Patient not taking: Reported on 07/17/2020 09/02/19   Rigoberto Noel, MD  lidocaine-prilocaine (EMLA) cream Apply 1 application topically as needed. Patient not taking: Reported on 07/17/2020 06/30/20   Orson Slick, MD  NONFORMULARY OR COMPOUNDED ITEM Estradiol 0.02% Insert 1 gram vaginally 2 times per week. 3 month supply with 3 refills. Patient not taking: Reported on 07/17/2020 08/06/19   Salvadore Dom, MD  nystatin cream (MYCOSTATIN) Apply 1 application topically 2 (two) times daily. Apply to affected area BID for up to 7 days. Patient not taking: Reported on 07/17/2020 08/05/19   Salvadore Dom, MD     Allergies:     Allergies  Allergen Reactions  . Codeine Nausea And Vomiting  . Penicillins Swelling and Other (See Comments)    Swelling  Of joints  Did it involve swelling of the face/tongue/throat, SOB, or low BP? No Did it involve sudden or severe rash/hives, skin peeling, or any reaction on the inside of your mouth  or nose? No Did you need to seek medical attention at a hospital or doctor's office? No When did it last happen? Childhood allergy **ALLERGIC TO CEPHALOSPORINS**   . Keflex [Cephalexin] Rash  . Latex Rash and Other (See Comments)    redness  . Sulfa Antibiotics Rash     Physical Exam:   Vitals  Blood pressure 106/63, pulse 84, temperature 98.6 F (37 C), resp. rate 16, height 5\' 4"  (1.626 m), weight 68 kg, last menstrual period 09/04/2002, SpO2 95 %.  1.  General: Sitting up in Bed in no acute distress  2. Psychiatric: Mood and behavior are normal for situation Seems to have good insight into her own healthcare  3. Neurologic: CNII -XII intact, moves all 4 extremities voluntarily, AO x 3  4. HEENMT:  Head is atraumatic, normocephalic, pupils reactive, neck is supple, mucous membranes moist, trachea midline  5. Respiratory : LCTABL, no wheeze, rhonchi, crackles, no clubbing or cyanosis  6. Cardiovascular : HRRR, no murmur, rub or gallop Port access right chest  7. Gastrointestinal:  Abdomen is soft, non distended, non tender to palpation  8. Skin:  Drug eruption on back - blanchable erythematous rash diffusely over back  9.Musculoskeletal:  No peripheral edema or acute deformity    Data Review:    CBC Recent Labs  Lab 07/16/20 1449 07/17/20 1720  WBC 4.4 4.4  HGB 13.5 13.4  HCT 40.3 40.3  PLT 144* 119*  MCV 92.2 93.5  MCH 30.9 31.1  MCHC 33.5 33.3  RDW 12.5 12.9  LYMPHSABS 0.1* 0.1*  MONOABS 0.4 0.3  EOSABS 0.1 0.1  BASOSABS 0.0 0.0   ------------------------------------------------------------------------------------------------------------------  Results for orders placed or performed during the hospital encounter of 07/17/20 (from the past 48 hour(s))  Comprehensive metabolic panel     Status: Abnormal   Collection Time: 07/17/20  5:20 PM  Result Value Ref Range   Sodium 135 135 - 145 mmol/L   Potassium 3.3 (L) 3.5 - 5.1 mmol/L   Chloride  102 98 - 111 mmol/L   CO2 24 22 - 32 mmol/L   Glucose, Bld 114 (H) 70 - 99 mg/dL    Comment: Glucose reference range applies only to samples taken after fasting for at least 8 hours.   BUN 12 8 - 23 mg/dL   Creatinine, Ser 0.90 0.44 - 1.00 mg/dL   Calcium 8.2 (L) 8.9 - 10.3 mg/dL   Total Protein 6.4 (L) 6.5 - 8.1 g/dL   Albumin 3.3 (L) 3.5 - 5.0 g/dL   AST 85 (H) 15 - 41 U/L   ALT 65 (H) 0 - 44 U/L   Alkaline Phosphatase 90 38 - 126 U/L   Total Bilirubin 0.6 0.3 - 1.2 mg/dL   GFR, Estimated >60 >60 mL/min    Comment: (NOTE) Calculated using the CKD-EPI Creatinine Equation (2021)    Anion gap 9 5 - 15    Comment: Performed at [facility], [place] [address], [place], [place] [address]  Lactic acid, plasma     Status: None   Collection Time: 07/17/20  5:20 PM  Result Value Ref Range   Lactic Acid, Venous 1.1 0.5 - 1.9 mmol/L    Comment: Performed at [facility], [place] [address] [place], [place] [address]  CBC with Differential     Status: Abnormal   Collection Time: 07/17/20  5:20 PM  Result Value Ref Range   WBC 4.4 4.0 - 10.5 K/uL   RBC 4.31 3.87 - 5.11 MIL/uL   Hemoglobin 13.4 12.0 - 15.0 g/dL   HCT 40.3 36 - 46 %   MCV 93.5 80.0 - 100.0 fL   MCH 31.1 26.0 - 34.0 pg   MCHC 33.3 30.0 - 36.0 g/dL   RDW 12.9 11.5 - 15.5 %   Platelets 119 (L) 150 - 400 K/uL    Comment: REPEATED TO VERIFY PLATELET COUNT CONFIRMED BY SMEAR SPECIMEN CHECKED FOR CLOTS Immature Platelet Fraction may be clinically indicated, consider ordering this additional test [id number]    nRBC 0.0 0.0 - 0.2 %   Neutrophils Relative % 88 %   Neutro Abs 3.8 1.7 - 7.7 K/uL   Lymphocytes Relative 2 %   Lymphs Abs 0.1 (L) 0.7 - 4.0 K/uL   Monocytes Relative 6 %   Monocytes Absolute 0.3 0.1 - 1.0 K/uL   Eosinophils Relative 2 %   Eosinophils Absolute 0.1 0.0 - 0.5 K/uL   Basophils Relative 1 %   Basophils Absolute 0.0 0.0 - 0.1 K/uL   Immature Granulocytes 1 %   Abs  Immature Granulocytes 0.05 0.00 - 0.07 K/uL    Comment: Performed at [facility], [place] [address], [place], [place] [address]  Protime-INR     Status: None   Collection Time: 07/17/20  5:20 PM  Result Value Ref Range   Prothrombin Time 14.2 11.4 - 15.2 seconds   INR 1.1 0.8 - 1.2    Comment: (NOTE) INR goal varies based on device and disease states. Performed at [facility], [place] [address] [place], [place] [address]   APTT     Status: None   Collection  Time: 07/17/20  5:20 PM  Result Value Ref Range   aPTT 30 24 - 36 seconds    Comment: Performed at [facility], [place] [address] [place], [place] [address]  Respiratory Panel by RT PCR (Flu A&B, Covid) - Nasopharyngeal Swab     Status: None   Collection Time: 07/17/20  5:21 PM   Specimen: Nasopharyngeal Swab  Result Value Ref Range   SARS Coronavirus 2 by RT PCR NEGATIVE NEGATIVE    Comment: (NOTE) SARS-CoV-2 target nucleic acids are NOT DETECTED.  The SARS-CoV-2 RNA is generally detectable in upper respiratoy specimens during the acute phase of infection. The lowest concentration of SARS-CoV-2 viral copies this assay can detect is 131 copies/mL. A negative result does not preclude SARS-Cov-2 infection and should not be used as the sole basis for treatment or other patient management decisions. A negative result may occur with  improper specimen collection/handling, submission of specimen other than nasopharyngeal swab, presence of viral mutation(s) within the areas targeted by this assay, and inadequate number of viral copies (<131 copies/mL). A negative result must be combined with clinical observations, patient history, and epidemiological information. The expected result is Negative.  Fact Sheet for Patients:  PinkCheek.be  Fact Sheet for Healthcare Providers:  GravelBags.it  This test is no t yet approved  or cleared by the [place] FDA and  has been authorized for detection and/or diagnosis of SARS-CoV-2 by FDA under an Emergency Use Authorization (EUA). This EUA will remain  in effect (meaning this test can be used) for the duration of the COVID-19 declaration under Section 564(b)(1) of the Act, 21 U.S.C. section 360bbb-3(b)(1), unless the authorization is terminated or revoked sooner.     Influenza A by PCR NEGATIVE NEGATIVE   Influenza B by PCR NEGATIVE NEGATIVE    Comment: (NOTE) The Xpert Xpress SARS-CoV-2/FLU/RSV assay is intended as an aid in  the diagnosis of influenza from Nasopharyngeal swab specimens and  should not be used as a sole basis for treatment. Nasal washings and  aspirates are unacceptable for Xpert Xpress SARS-CoV-2/FLU/RSV  testing.  Fact Sheet for Patients: PinkCheek.be  Fact Sheet for Healthcare Providers: GravelBags.it  This test is not yet approved or cleared by the [place] FDA and  has been authorized for detection and/or diagnosis of SARS-CoV-2 by  FDA under an Emergency Use Authorization (EUA). This EUA will remain  in effect (meaning this test can be used) for the duration of the  Covid-19 declaration under Section 564(b)(1) of the Act, 21  U.S.C. section 360bbb-3(b)(1), unless the authorization is  terminated or revoked. Performed at [facility], [place] [address] [place],  [address]   Urinalysis, Routine w reflex microscopic     Status: Abnormal   Collection Time: 07/17/20  6:16 PM  Result Value Ref Range   Color, Urine YELLOW YELLOW   APPearance CLEAR CLEAR   Specific Gravity, Urine 1.018 1.005 - 1.030   pH 5.0 5.0 - 8.0   Glucose, UA NEGATIVE NEGATIVE mg/dL   Hgb urine dipstick NEGATIVE NEGATIVE   Bilirubin Urine NEGATIVE NEGATIVE   Ketones, ur NEGATIVE NEGATIVE mg/dL   Protein, ur NEGATIVE NEGATIVE mg/dL   Nitrite NEGATIVE NEGATIVE    Leukocytes,Ua TRACE (A) NEGATIVE   RBC / HPF 0-5 0 - 5 RBC/hpf   WBC, UA 6-10 0 - 5 WBC/hpf   Bacteria, UA NONE SEEN NONE SEEN   Squamous Epithelial / LPF 0-5 0 - 5   Hyaline Casts, UA  PRESENT     Comment: Performed at [facility], [place] [address] [place], [place] [address]    Chemistries  Recent Labs  Lab 07/16/20 1449 07/17/20 1720  NA 136 135  K 3.6 3.3*  CL 104 102  CO2 26 24  GLUCOSE 101* 114*  BUN 8 12  CREATININE 0.84 0.90  CALCIUM 8.9 8.2*  AST 38 85*  ALT 28 65*  ALKPHOS 98 90  BILITOT 0.6 0.6   ------------------------------------------------------------------------------------------------------------------  ------------------------------------------------------------------------------------------------------------------ GFR: Estimated Creatinine Clearance: 54.3 mL/min (by C-G formula based on SCr of 0.9 mg/dL). Liver Function Tests: Recent Labs  Lab 07/16/20 1449 07/17/20 1720  AST 38 85*  ALT 28 65*  ALKPHOS 98 90  BILITOT 0.6 0.6  PROT 6.3* 6.4*  ALBUMIN 3.2* 3.3*   No results for input(s): LIPASE, AMYLASE in the last 168 hours. No results for input(s): AMMONIA in the last 168 hours. Coagulation Profile: Recent Labs  Lab 07/17/20 1720  INR 1.1   Cardiac Enzymes: No results for input(s): CKTOTAL, CKMB, CKMBINDEX, TROPONINI in the last 168 hours. BNP (last 3 results) No results for input(s): PROBNP in the last 8760 hours. HbA1C: No results for input(s): HGBA1C in the last 72 hours. CBG: No results for input(s): GLUCAP in the last 168 hours. Lipid Profile: No results for input(s): CHOL, HDL, LDLCALC, TRIG, CHOLHDL, LDLDIRECT in the last 72 hours. Thyroid Function Tests: No results for input(s): TSH, T4TOTAL, FREET4, T3FREE, THYROIDAB in the last 72 hours. Anemia Panel: No results for input(s): VITAMINB12, FOLATE, FERRITIN, TIBC, IRON, RETICCTPCT in the last 72  hours.  --------------------------------------------------------------------------------------------------------------- Urine analysis:    Component Value Date/Time   COLORURINE YELLOW 07/17/2020 1816   APPEARANCEUR CLEAR 07/17/2020 1816   LABSPEC 1.018 07/17/2020 1816   PHURINE 5.0 07/17/2020 1816   GLUCOSEU NEGATIVE 07/17/2020 1816   HGBUR NEGATIVE 07/17/2020 1816   BILIRUBINUR NEGATIVE 07/17/2020 1816   BILIRUBINUR n 04/05/2013 1328   KETONESUR NEGATIVE 07/17/2020 1816   PROTEINUR NEGATIVE 07/17/2020 1816   UROBILINOGEN 1.0 12/28/2014 1007   NITRITE NEGATIVE 07/17/2020 1816   LEUKOCYTESUR TRACE (A) 07/17/2020 1816      Imaging Results:    DG Chest 2 View  Result Date: 07/17/2020 CLINICAL DATA:  69 year old female with suspected sepsis. EXAM: CHEST - 2 VIEW COMPARISON:  Chest radiograph dated 09/02/2019. FINDINGS: Right-sided Port-A-Cath with tip at the cavoatrial junction. There are bibasilar linear atelectasis. No focal consolidation, pleural effusion, or pneumothorax. There is background of mild chronic bronchitic changes. The cardiac silhouette is within limits. No acute osseous pathology. IMPRESSION: No active cardiopulmonary disease. Electronically Signed   By: Anner Crete M.D.   On: 07/17/2020 17:08    My personal review of EKG: Rhythm NSR, Rate 93/min, QTc 457 ,no Acute ST changes   Assessment & Plan:    Active Problems:   Sepsis ([place])   1. Sepsis 2/2 UTI 1. T 101 P 96 suspected source is UTI 2. Immunocompromised - on chemo 3. Lactic acid normal 4. Started treatment outpatient for UTI yesterday with cephalexin 5. Today UA borderline 6. Urine culture pending 7. Blood culture pending 8. CXR = no active disease 9. Has had diarrhea and vomiting - since starting new antibiotic - likely a medication side effect and not infectious. Continue to monitor 10. Negative flu and covid 11. Continue aztreonam (Pen allergy) and Vanco 12. Full fluid bolus given in ED,  continue LR 160ml 13. Another possible source could be port associated infecton - Right chest port  just placed 4 days ago.  2. Hypokalemia 1. Replace and recheck 3. Fever in adult 1. T max 103.5 2. Tylenol PRN 3. See plan above 4. UTI 1. UA borderline today - but started treatment yesterday 2. Urine culture pending 3. Continue Broad spectrum until confirmed 5. Drug eruption  1. Started when she started Cephalexin  2. History of Penicillin allergy 3. 4 doses taken total 4. Benadryl and Solumedrol   DVT Prophylaxis-   Heparin - SCDs   AM Labs Ordered, also please review Full Orders  Family Communication: No family at bedside  Code Status:  Full  Admission status: Inpatient :The appropriate admission status for this patient is INPATIENT. Inpatient status is judged to be reasonable and necessary in order to provide the required intensity of service to ensure the patient's safety. The patient's presenting symptoms, physical exam findings, and initial radiographic and laboratory data in the context of their chronic comorbidities is felt to place them at high risk for further clinical deterioration. Furthermore, it is not anticipated that the patient will be medically stable for discharge from the hospital within 2 midnights of admission. The following factors support the admission status of inpatient.     The patient's presenting symptoms include fever The worrisome physical exam findings include rigors The initial radiographic and laboratory data are worrisome because of borderline leukopenia The chronic co-morbidities include Lymphoma, HLD, GERD, anxiety and more       * I certify that at the point of admission it is my clinical judgment that the patient will require inpatient hospital care spanning beyond 2 midnights from the point of admission due to high intensity of service, high risk for further deterioration and high frequency of surveillance required.*  Time spent in  minutes : [place]

## 2020-07-17 NOTE — Progress Notes (Signed)
Pharmacy Antibiotic Note  Alyssa Elliott is a 69 y.o. female admitted on 07/17/2020 with sepsis of unknown source.  Pharmacy has been consulted for vancomycin and aztreonam dosing.  Plan: Vancomycin 1000 mg + 500 mg iv loading dose followed by 1250 mg iv q 24 hours Predicted AUC 561  Aztreonam 2 grams IV q 8 hours  F/U renal function, culture results, clinical course  Height: 5\' 4"  (162.6 cm) Weight: 68 kg (150 lb) IBW/kg (Calculated) : 54.7  Temp (24hrs), Avg:101.3 F (38.5 C), Min:99.9 F (37.7 C), Max:103 F (39.4 C)  Recent Labs  Lab 07/16/20 1449 07/17/20 1720  WBC 4.4 4.4  CREATININE 0.84 0.90  LATICACIDVEN  --  1.1    Estimated Creatinine Clearance: 54.3 mL/min (by C-G formula based on SCr of 0.9 mg/dL).    Allergies  Allergen Reactions  . Codeine Nausea And Vomiting  . Penicillins Swelling and Other (See Comments)    Swelling  Of joints  Did it involve swelling of the face/tongue/throat, SOB, or low BP? No Did it involve sudden or severe rash/hives, skin peeling, or any reaction on the inside of your mouth or nose? No Did you need to seek medical attention at a hospital or doctor's office? No When did it last happen? Childhood allergy **ALLERGIC TO CEPHALOSPORINS**   . Keflex [Cephalexin] Rash  . Latex Rash and Other (See Comments)    redness  . Sulfa Antibiotics Rash    Thank you for allowing pharmacy to be a part of this patient's care.  Napoleon Form 07/17/2020 10:38 PM

## 2020-07-17 NOTE — Sepsis Progress Note (Signed)
Elink monitoring this Code Sepsis. 

## 2020-07-17 NOTE — ED Notes (Signed)
Report given to [facility] RN.  Covered information in pt SBAR.  NO further questions at this time.  PT in [place].

## 2020-07-17 NOTE — Telephone Encounter (Signed)
Received call from pt's husband, [name]. Pt was seen here yesterday due to fever, dysuria, low back pain.  She was sent home after labs and being seen in [facility]. Started on Keflex. Husband states she is spiking fevers to >103. Temperature comes back down to around 100-100.4 and then spikes back up again. C/o nausea, low back pain, decreased appetite, no energy at all. She is unable to sit up for any length of time due to fatigue, fever and feeling very unwell. Spoke with Dr. Lorenso Courier and he very strongly recommends pt go to [facility] ED immediately for blood cultures, IV antibiotics etc. Pt is reluctant to go as she feels she cannot sit in waiting room very long as she feels so bad. Dr. Lorenso Courier to call the ED and advise the charge nurse of her pending arrival. [name] and pt are agreeable to come to ED with Dr. Lorenso Courier calling ahead.

## 2020-07-18 ENCOUNTER — Other Ambulatory Visit: Payer: Self-pay

## 2020-07-18 ENCOUNTER — Encounter (HOSPITAL_COMMUNITY): Payer: Self-pay | Admitting: Family Medicine

## 2020-07-18 DIAGNOSIS — A4150 Gram-negative sepsis, unspecified: Principal | ICD-10-CM

## 2020-07-18 DIAGNOSIS — J45909 Unspecified asthma, uncomplicated: Secondary | ICD-10-CM

## 2020-07-18 DIAGNOSIS — K219 Gastro-esophageal reflux disease without esophagitis: Secondary | ICD-10-CM

## 2020-07-18 DIAGNOSIS — C8253 Diffuse follicle center lymphoma, intra-abdominal lymph nodes: Secondary | ICD-10-CM

## 2020-07-18 DIAGNOSIS — N390 Urinary tract infection, site not specified: Secondary | ICD-10-CM

## 2020-07-18 LAB — COMPREHENSIVE METABOLIC PANEL
ALT: 83 U/L — ABNORMAL HIGH (ref 0–44)
AST: 97 U/L — ABNORMAL HIGH (ref 15–41)
Albumin: 2.8 g/dL — ABNORMAL LOW (ref 3.5–5.0)
Alkaline Phosphatase: 93 U/L (ref 38–126)
Anion gap: 10 (ref 5–15)
BUN: 9 mg/dL (ref 8–23)
CO2: 24 mmol/L (ref 22–32)
Calcium: 8.4 mg/dL — ABNORMAL LOW (ref 8.9–10.3)
Chloride: 107 mmol/L (ref 98–111)
Creatinine, Ser: 0.76 mg/dL (ref 0.44–1.00)
GFR, Estimated: >60 mL/min (ref >60)
Glucose, Bld: 162 mg/dL — ABNORMAL HIGH (ref 70–99)
Potassium: 4 mmol/L (ref 3.5–5.1)
Sodium: 141 mmol/L (ref 135–145)
Total Bilirubin: 0.8 mg/dL (ref 0.3–1.2)
Total Protein: 5.7 g/dL — ABNORMAL LOW (ref 6.5–8.1)

## 2020-07-18 LAB — CBC WITH DIFFERENTIAL/PLATELET
Abs Immature Granulocytes: 0.04 10*3/uL (ref 0.00–0.07)
Band Neutrophils: 0 %
Basophils Absolute: 0 10*3/uL (ref 0.0–0.1)
Basophils Relative: 1 %
Blasts: 0 %
Eosinophils Absolute: 0 10*3/uL (ref 0.0–0.5)
Eosinophils Relative: 0 %
HCT: 37.9 % (ref 36.0–46.0)
Hemoglobin: 12.4 g/dL (ref 12.0–15.0)
Lymphocytes Relative: 2 %
Lymphs Abs: 0.1 10*3/uL — ABNORMAL LOW (ref 0.7–4.0)
MCH: 31.2 pg (ref 26.0–34.0)
MCHC: 32.7 g/dL (ref 30.0–36.0)
MCV: 95.2 fL (ref 80.0–100.0)
Metamyelocytes Relative: 0 %
Monocytes Absolute: 0.2 10*3/uL (ref 0.1–1.0)
Monocytes Relative: 4 %
Myelocytes: 1 %
Neutro Abs: 3.6 10*3/uL (ref 1.7–7.7)
Neutrophils Relative %: 92 %
Other: 0 %
Platelets: 104 10*3/uL — ABNORMAL LOW (ref 150–400)
Promyelocytes Relative: 0 %
RBC: 3.98 MIL/uL (ref 3.87–5.11)
RDW: 13 % (ref 11.5–15.5)
WBC: 3.9 10*3/uL — ABNORMAL LOW (ref 4.0–10.5)
nRBC: 0 % (ref 0.0–0.2)
nRBC: 0 /100 WBC

## 2020-07-18 LAB — LACTIC ACID, PLASMA: Lactic Acid, Venous: 1.7 mmol/L (ref 0.5–1.9)

## 2020-07-18 LAB — PROTIME-INR
INR: 1.2 (ref 0.8–1.2)
Prothrombin Time: 14.9 seconds (ref 11.4–15.2)

## 2020-07-18 LAB — MAGNESIUM: Magnesium: 2 mg/dL (ref 1.7–2.4)

## 2020-07-18 LAB — PROCALCITONIN: Procalcitonin: 0.95 ng/mL

## 2020-07-18 LAB — CORTISOL-AM, BLOOD: Cortisol - AM: 10.6 ug/dL (ref 6.7–22.6)

## 2020-07-18 MED ORDER — ACETAMINOPHEN 650 MG RE SUPP: 650.0000 mg | Freq: Four times a day (QID) | RECTAL | Status: DC | PRN

## 2020-07-18 MED ORDER — POLYETHYLENE GLYCOL 3350 17 G PO PACK: 17.0000 g | PACK | Freq: Every day | ORAL | Status: DC | PRN

## 2020-07-18 MED ORDER — ONDANSETRON HCL 4 MG/2ML IJ SOLN: 4.0000 mg | Freq: Four times a day (QID) | INTRAMUSCULAR | Status: DC | PRN

## 2020-07-18 MED ORDER — PRAVASTATIN SODIUM 40 MG PO TABS
40.0000 mg | ORAL_TABLET | Freq: Every day | ORAL | Status: DC
  Administered 2020-07-18 – 2020-07-21 (×4): 40 mg via ORAL
  Filled 2020-07-18 (×4): qty 1

## 2020-07-18 MED ORDER — HEPARIN SODIUM (PORCINE) 5000 UNIT/ML IJ SOLN
5000.0000 [IU] | Freq: Three times a day (TID) | INTRAMUSCULAR | Status: DC
  Administered 2020-07-18 (×2): 5000 [IU] via SUBCUTANEOUS
  Filled 2020-07-18 (×2): qty 1

## 2020-07-18 MED ORDER — ASPIRIN 81 MG PO CHEW
81.0000 mg | CHEWABLE_TABLET | Freq: Every day | ORAL | Status: DC
  Administered 2020-07-18 – 2020-07-21 (×5): 81 mg via ORAL
  Filled 2020-07-18 (×4): qty 1

## 2020-07-18 MED ORDER — ACETAMINOPHEN 325 MG PO TABS
650.0000 mg | ORAL_TABLET | Freq: Four times a day (QID) | ORAL | Status: DC | PRN
  Administered 2020-07-19 – 2020-07-20 (×2): 650 mg via ORAL
  Filled 2020-07-18 (×2): qty 2

## 2020-07-18 MED ORDER — ONDANSETRON HCL 4 MG PO TABS: 4.0000 mg | ORAL_TABLET | Freq: Four times a day (QID) | ORAL | Status: DC | PRN

## 2020-07-18 MED ORDER — OXYCODONE HCL 5 MG PO TABS: 5.0000 mg | ORAL_TABLET | ORAL | Status: DC | PRN

## 2020-07-18 MED ORDER — ACYCLOVIR 400 MG PO TABS
400.0000 mg | ORAL_TABLET | Freq: Two times a day (BID) | ORAL | Status: DC
  Administered 2020-07-18 – 2020-07-22 (×9): 400 mg via ORAL
  Filled 2020-07-18 (×9): qty 1

## 2020-07-18 MED ORDER — ENOXAPARIN SODIUM 40 MG/0.4ML ~~LOC~~ SOLN
40.0000 mg | SUBCUTANEOUS | Status: DC
  Administered 2020-07-18 – 2020-07-21 (×4): 40 mg via SUBCUTANEOUS
  Filled 2020-07-18 (×4): qty 0.4

## 2020-07-18 MED ORDER — MELATONIN 5 MG PO TABS
5.0000 mg | ORAL_TABLET | Freq: Every day | ORAL | Status: DC
  Administered 2020-07-18 – 2020-07-21 (×5): 5 mg via ORAL
  Filled 2020-07-18 (×5): qty 1

## 2020-07-18 MED ORDER — MONTELUKAST SODIUM 10 MG PO TABS
10.0000 mg | ORAL_TABLET | Freq: Every day | ORAL | Status: DC
  Administered 2020-07-18 – 2020-07-21 (×5): 10 mg via ORAL
  Filled 2020-07-18 (×4): qty 1

## 2020-07-18 MED ORDER — LEVALBUTEROL TARTRATE 45 MCG/ACT IN AERO
1.0000 | INHALATION_SPRAY | RESPIRATORY_TRACT | Status: DC | PRN
  Filled 2020-07-18: qty 15

## 2020-07-18 MED ORDER — PAROXETINE HCL 20 MG PO TABS
40.0000 mg | ORAL_TABLET | Freq: Every day | ORAL | Status: DC
  Administered 2020-07-18 – 2020-07-22 (×5): 40 mg via ORAL
  Filled 2020-07-18 (×4): qty 2

## 2020-07-18 MED ORDER — ARIPIPRAZOLE 2 MG PO TABS
2.0000 mg | ORAL_TABLET | Freq: Every day | ORAL | Status: DC
  Administered 2020-07-18 – 2020-07-22 (×5): 2 mg via ORAL
  Filled 2020-07-18 (×5): qty 1

## 2020-07-18 MED ORDER — PANTOPRAZOLE SODIUM 40 MG PO TBEC
40.0000 mg | DELAYED_RELEASE_TABLET | Freq: Two times a day (BID) | ORAL | Status: DC
  Administered 2020-07-18 – 2020-07-22 (×9): 40 mg via ORAL
  Filled 2020-07-18 (×9): qty 1

## 2020-07-18 NOTE — Plan of Care (Signed)
  Problem: Clinical Measurements: Goal: Diagnostic test results will improve Outcome: Progressing Goal: Signs and symptoms of infection will decrease Outcome: Progressing   Problem: Respiratory: Goal: Ability to maintain adequate ventilation will improve Outcome: Progressing   Problem: Fluid Volume: Goal: Hemodynamic stability will improve Outcome: Progressing

## 2020-07-18 NOTE — Progress Notes (Signed)
PROGRESS NOTE    Alyssa Elliott  BJS:[id number] DOB: 1950-10-27 DOA: 07/17/2020 PCP: Prince Solian, MD    Brief Narrative:  Mrs. Culhane was admitted to the hospital with a working diagnosis of sepsis due to urine tract infection.  69 year old female with past medical history of lymphoma, GERD, dyslipidemia, depression, asthma, anxiety and vitamin D deficiency.  She reported fever up to 103.5 F for the last 2 days.  Associated with chills, and nausea.  She received treatment with cephalexin for urine tract infection 24 hours prior to hospitalization.  She took about 4 doses, noticed erythematous rash on her back.  She had recently a port implanted 07/13/20.  On her initial physical examination she was febrile 103 F, heart rate 96, respiratory rate 18, blood pressure 129/48, oxygen saturation 95% on room air.  Her lungs were clear to auscultation bilaterally, no wheezing or rhonchi, heart S1-S2, present rhythmic, soft abdomen, no lower extremity edema. Sodium 135, potassium 3.3, chloride 102, bicarb 24, glucose 114, BUN 12, creatinine 0.90, AST 85, LT 65, white count 4.4, hemoglobin 13.4, hematocrit 40.3, platelets 119.  Lactic acid 1.1.  SARS COVID-19 negative.  Urine analysis with 6-10 white cells, specific gravity 1.018.  Chest radiograph no infiltrates.    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 Sepsis due to urine infection. Positive mass involving the region of the lower uterine and segment in the posterior pelvis, likely affecting bladder anatomy.   Continue antibiotic therapy with aztreonam IV, follow on cultures, cell count and temperature curve.  No local changes at her port a cath.   Discontinue vancomycin, low probability of MRSA infection.   2. Non-Hodgkin's  B-cell lymphoma, follicular lymphoma stage III, involving the scalp, axillary lymph nodes, hilar, infrahilar nodes, subcarinal node, mesenteric lymph nodes, uterus/cervix.  Patient sp one cycle of chemotherapy with induced immunosuppression. Follows with oncology as outpatient. Old records personally reviewed.    Will add Dr. Lorenso Courier and the oncology team to the care team.  Continue with acyclovir.   Out of bed to chair tid with meals, follow with PT and OT recommendations.   3. Hypokalemia. Stable renal function, K is up to 4,0 and Mg at 2,0, cr at 0,76.  Continue to follow up on renal function and electrolytes. Patient is tolerating po well, will hold on further IV fluids.   4. Drug allergic reaction. Patient with diffuse erythematous rash when taking cephalexin.  Continue to avoid cephalosporins. Rash has improved, will discontinue H2 blockers and steroids.   5. Depression. Continue ariprazole and paroxetine.   6. Dyslipidemia. Continue with pravastatin.   7. Bronchitis. No sings of exacerbation, continue with montelukast and levalbuterol.   Patient continue to be at high risk for worsening sepsis.   Status is: Inpatient  Remains inpatient appropriate because:IV treatments appropriate due to intensity of illness or inability to take PO   Dispo: The patient is from: Home              Anticipated d/c is to: Home              Anticipated d/c date is: 3 days              Patient currently is not medically stable to d/c.   DVT prophylaxis: Enoxaparin   Code Status:  full  Family Communication:  I spoke with patient's husband at the bedside, we talked in detail about patient's condition, plan of care and prognosis and all questions were addressed.     Antimicrobials:   Aztreonam     Subjective: Patient is feeling better, but not yet back to baseline, no dysuria or abdominal pain, no nausea or vomiting, her rash has improved, no oral or mucosal lesions,    Objective: Vitals:   07/18/20 0236 07/18/20 0548 07/18/20 1257 07/18/20 1423  BP: (!) 107/53 115/64 (!) 110/55   Pulse: 88 81 67   Resp: 16 20 17    Temp: 98.5 F (36.9 C) 98.2 F (36.8 C) 97.9 F (36.6 C) 98.2 F (36.8 C)  TempSrc: Oral Oral  Oral  SpO2: 90% 95% 92%   Weight:      Height:        Intake/Output Summary (Last 24 hours) at 07/18/2020 1527 Last data filed at 07/18/2020 0600 Gross per 24 hour  Intake 4241.22 ml  Output --  Net 4241.22 ml   Filed Weights   07/17/20 1646  Weight: 68 kg    Examination:   General: Not in pain or dyspnea, deconditioned  Neurology: Awake and alert, non focal  E ENT: no pallor, no icterus, oral mucosa moist Cardiovascular: No JVD. S1-S2 present, rhythmic, no gallops, rubs, or murmurs. No lower extremity edema. Pulmonary: positive breath sounds bilaterally, adequate air movement, no wheezing, rhonchi or rales. Gastrointestinal. Abdomen soft and non tender Skin. No rashes/ no tenderness or erythema at the site of port a cath at the right side. Musculoskeletal: no joint deformities     Data Reviewed: I have personally reviewed following labs and imaging studies  CBC: Recent Labs  Lab 07/16/20 1449 07/17/20 1720 07/18/20 0410  WBC 4.4 4.4 3.9*  NEUTROABS 3.7 3.8 3.6  HGB 13.5 13.4 12.4  HCT 40.3 40.3 37.9  MCV 92.2 93.5 95.2  PLT 144* 119* 740*   Basic Metabolic Panel: Recent Labs  Lab 07/16/20 1449 07/17/20 1720 07/18/20 0410  NA 136 135 141  K 3.6 3.3* 4.0  CL 104 102 107  CO2 26 24 24   GLUCOSE 101* 114* 162*  BUN 8 12 9   CREATININE 0.84 0.90 0.76  CALCIUM 8.9 8.2* 8.4*  MG  --   --  2.0   GFR: Estimated Creatinine Clearance: 61.1 mL/min (by C-G formula based on SCr of 0.76 mg/dL). Liver Function Tests: Recent Labs  Lab 07/16/20 1449 07/17/20 1720 07/18/20 0410  AST 38 85* 97*  ALT 28 65* 83*  ALKPHOS 98 90 93  BILITOT 0.6 0.6 0.8  PROT 6.3* 6.4* 5.7*  ALBUMIN 3.2* 3.3* 2.8*   No results  for input(s): LIPASE, AMYLASE in the last 168 hours. No results for input(s): AMMONIA in the last 168 hours. Coagulation Profile: Recent Labs  Lab 07/17/20 1720 07/18/20 0410  INR 1.1 1.2   Cardiac Enzymes: No results for input(s): CKTOTAL, CKMB, CKMBINDEX, TROPONINI in the last 168 hours. BNP (last 3 results) No results for input(s): PROBNP in the last 8760 hours. HbA1C: No results for input(s): HGBA1C in the last 72 hours. CBG: No results for input(s): GLUCAP in the last 168 hours. Lipid Profile: No results for input(s): CHOL, HDL, LDLCALC, TRIG, CHOLHDL, LDLDIRECT in the last 72 hours. Thyroid Function Tests: No results for input(s): TSH, T4TOTAL, FREET4, T3FREE, THYROIDAB in the last 72 hours. Anemia Panel: No results for input(s): VITAMINB12, FOLATE, FERRITIN, TIBC, IRON, RETICCTPCT in the last 72  hours.    Radiology Studies: I have reviewed all of the imaging during this hospital visit personally     Scheduled Meds: . acyclovir  400 mg Oral BID  . ARIPiprazole  2 mg Oral Daily  . aspirin  81 mg Oral QHS  . Chlorhexidine Gluconate Cloth  6 each Topical Daily  . heparin  5,000 Units Subcutaneous Q8H  . melatonin  5 mg Oral QHS  . methylPREDNISolone (SOLU-MEDROL) injection  60 mg Intravenous Q12H  . montelukast  10 mg Oral QHS  . pantoprazole  40 mg Oral BID  . PARoxetine  40 mg Oral Daily  . pravastatin  40 mg Oral QHS  . sodium chloride flush  10-40 mL Intracatheter Q12H   Continuous Infusions: . aztreonam 2 g (07/18/20 0943)  . vancomycin       LOS: 1 day        Perrin Gens Gerome Apley, MD

## 2020-07-19 LAB — BASIC METABOLIC PANEL
Anion gap: 7 (ref 5–15)
BUN: 13 mg/dL (ref 8–23)
CO2: 26 mmol/L (ref 22–32)
Calcium: 8.1 mg/dL — ABNORMAL LOW (ref 8.9–10.3)
Chloride: 107 mmol/L (ref 98–111)
Creatinine, Ser: 0.73 mg/dL (ref 0.44–1.00)
GFR, Estimated: >60 mL/min (ref >60)
Glucose, Bld: 134 mg/dL — ABNORMAL HIGH (ref 70–99)
Potassium: 4 mmol/L (ref 3.5–5.1)
Sodium: 140 mmol/L (ref 135–145)

## 2020-07-19 LAB — CBC WITH DIFFERENTIAL/PLATELET
Abs Immature Granulocytes: 0.05 10*3/uL (ref 0.00–0.07)
Basophils Absolute: 0 10*3/uL (ref 0.0–0.1)
Basophils Relative: 0 %
Eosinophils Absolute: 0 10*3/uL (ref 0.0–0.5)
Eosinophils Relative: 0 %
HCT: 34.2 % — ABNORMAL LOW (ref 36.0–46.0)
Hemoglobin: 11.2 g/dL — ABNORMAL LOW (ref 12.0–15.0)
Immature Granulocytes: 1 %
Lymphocytes Relative: 5 %
Lymphs Abs: 0.3 10*3/uL — ABNORMAL LOW (ref 0.7–4.0)
MCH: 31.1 pg (ref 26.0–34.0)
MCHC: 32.7 g/dL (ref 30.0–36.0)
MCV: 95 fL (ref 80.0–100.0)
Monocytes Absolute: 0.5 10*3/uL (ref 0.1–1.0)
Monocytes Relative: 9 %
Neutro Abs: 4.8 10*3/uL (ref 1.7–7.7)
Neutrophils Relative %: 85 %
Platelets: 104 10*3/uL — ABNORMAL LOW (ref 150–400)
RBC: 3.6 MIL/uL — ABNORMAL LOW (ref 3.87–5.11)
RDW: 13.3 % (ref 11.5–15.5)
WBC: 5.6 10*3/uL (ref 4.0–10.5)
nRBC: 0 % (ref 0.0–0.2)

## 2020-07-19 LAB — URINE CULTURE: Culture: NO GROWTH

## 2020-07-19 MED ORDER — DIPHENHYDRAMINE HCL 25 MG PO CAPS
25.0000 mg | ORAL_CAPSULE | Freq: Once | ORAL | Status: AC
  Administered 2020-07-19: 25 mg via ORAL
  Filled 2020-07-19: qty 1

## 2020-07-19 MED ORDER — SODIUM CHLORIDE 0.9 % IV BOLUS
500.0000 mL | Freq: Once | INTRAVENOUS | Status: AC
  Administered 2020-07-19: 500 mL via INTRAVENOUS

## 2020-07-19 MED ORDER — IOHEXOL 9 MG/ML PO SOLN: 500.0000 mL | ORAL | Status: AC

## 2020-07-19 NOTE — Progress Notes (Addendum)
Patient ID: Alyssa Elliott, female   DOB: 05-20-1951, 69 y.o.   MRN: [id number]  PROGRESS NOTE    [name]  XMD:[id number] DOB: 07-30-1951 DOA: 07/17/2020 PCP: Prince Solian, MD    Brief Narrative:   Alyssa Elliott  is a 69 y.o. female,hx of vit D def, Lymphoma (with port placed 4 days ago), GERD, HLD, Depression, Asthma, anxiety and more presents to the ED with a chief complaint of fevers. Patient reports that she has been spiking fevers with a Tmax of 103.5 at home for the last two days. Tylenol has only offered temporary relief. She has had associated chills as well. She reports nausea, but no emesis. She started treatment for UTI yesterday with Cephalexin. Patient had an early childhood Penicillin allergy, and she noticed an erythematous rash develop on her back after she started taking the medication.  Patient reports no cough, no dyspnea.She did recently have a right chest port placed. She reports that she not had any redness around the site, and has not noticed any drainage.  Admitted with leukopenic fever, started on broad-spectrum antibiotics.    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 Sepsis due to urine infection. Positive mass involving the region of the lower uterine and segment in the posterior pelvis, likely affecting bladder anatomy.   Continue antibiotic therapy with aztreonam IV Urine culture prior to antibiotic start is negative Blood cultures are negative to date  Discontinue vancomycin, low probability of MRSA infection.   Reviewed case with ID who recommended CT abdomen chest pelvis to rule out any other sort of infection.  The patient declined this today and wants to speak to her oncologist.  Following this recommendation ID also  recommended stopping her antibiotics and seeing if she spikes again.  We will continue these for now so that she will get at least 48 hours of IV antibiotics prior to stopping.  Would consider formal ID consult in the morning.  2. Non-Hodgkin's B-cell lymphoma, follicular lymphoma stage III, involving the scalp, axillary lymph nodes, hilar, infrahilar nodes, subcarinal node, mesenteric lymph nodes, uterus/cervix.  Patient sp one cycle of chemotherapy with induced immunosuppression. Follows with oncology as outpatient. Old records personally reviewed.    Will add Dr. Lorenso Courier and the oncology team to the care team.  Continue with acyclovir.   Out of bed to chair tid with meals, follow with PT and OT recommendations.   3. Hypokalemia.  Resolved, trend  4. Drug allergic reaction. Patient with diffuse erythematous rash when taking cephalexin.  Continue to avoid cephalosporins. Rash has improved, will discontinue H2 blockers and steroids.  See pictures in media tab  5. Depression. Continue ariprazole and paroxetine.   6. Dyslipidemia. Continue with pravastatin.   7. Bronchitis. No sings of exacerbation, continue with montelukast and levalbuterol.  8.  Abnormal liver function tests.  Likely related to her chemotherapy, reviewed with pharmacy it is not uncommon to have a transient increase in LFTs at 14 days post infusion.  DVT prophylaxis: Lovenox SQ Code Status: Full code  Family Communication: Patient and husband at bedside Disposition Plan: Home  Patient remains inpatient due to ongoing IV antibiotics, continued work-up.  Consultants:   Pharmacy  Procedures:  None  Antimicrobials: Anti-infectives (From admission, onward)   Start     Dose/Rate Route  Frequency Ordered Stop   07/18/20 2300  vancomycin (VANCOREADY) IVPB 1250 mg/250 mL  Status:  Discontinued        1,250 mg 166.7 mL/hr over 90 Minutes Intravenous Every 24 hours 07/17/20 2235 07/18/20 1604   07/18/20 1000   acyclovir (ZOVIRAX) tablet 400 mg        400 mg Oral 2 times daily 07/18/20 0002     07/18/20 0030  aztreonam (AZACTAM) 2 g in sodium chloride 0.9 % 100 mL IVPB        2 g 200 mL/hr over 30 Minutes Intravenous Every 8 hours 07/17/20 2235     07/17/20 2330  ceFEPIme (MAXIPIME) 2 g in sodium chloride 0.9 % 100 mL IVPB  Status:  Discontinued        2 g 200 mL/hr over 30 Minutes Intravenous Every 12 hours 07/17/20 2227 07/17/20 2231   07/17/20 2330  vancomycin (VANCOREADY) IVPB 500 mg/100 mL        500 mg 100 mL/hr over 60 Minutes Intravenous  Once 07/17/20 2227 07/18/20 0322   07/17/20 1730  aztreonam (AZACTAM) 2 g in sodium chloride 0.9 % 100 mL IVPB        2 g 200 mL/hr over 30 Minutes Intravenous  Once 07/17/20 1720 07/17/20 1909   07/17/20 1730  metroNIDAZOLE (FLAGYL) IVPB 500 mg        500 mg 100 mL/hr over 60 Minutes Intravenous  Once 07/17/20 1720 07/17/20 2025   07/17/20 1730  vancomycin (VANCOCIN) IVPB 1000 mg/200 mL premix        1,000 mg 200 mL/hr over 60 Minutes Intravenous  Once 07/17/20 1720 07/17/20 2210       Subjective: Feels much improved.  Reports her rash is getting somewhat better.  Objective: Vitals:   07/18/20 2150 07/19/20 0555 07/19/20 1454 07/19/20 1455  BP: (!) 100/59 108/62 109/73   Pulse: 75 70 68   Resp: 18 20 18    Temp: 98.4 F (36.9 C) 98.3 F (36.8 C)  98.7 F (37.1 C)  TempSrc: Oral Oral  Oral  SpO2: 92% 92% 94%   Weight:      Height:        Intake/Output Summary (Last 24 hours) at 07/19/2020 1537 Last data filed at 07/19/2020 1500 Gross per 24 hour  Intake 900 ml  Output --  Net 900 ml   Filed Weights   07/17/20 1646  Weight: 68 kg    Examination:  General exam: Appears calm and comfortable  Respiratory system: Clear to auscultation. Respiratory effort normal. Cardiovascular system: S1 & S2 heard, RRR.  Gastrointestinal system: Abdomen is nondistended, soft and nontender.  Central nervous system: Alert and oriented. No focal  neurological deficits. Extremities: Symmetric  Skin: Erythematous, non-blanching, port site is examined and looks normal. Psychiatry: Judgement and insight appear normal. Mood & affect appropriate.  Media Information    Media Information    Data Reviewed: I have personally reviewed following labs and imaging studies  CBC: Recent Labs  Lab 07/16/20 1449 07/17/20 1720 07/18/20 0410 07/19/20 0426  WBC 4.4 4.4 3.9* 5.6  NEUTROABS 3.7 3.8 3.6 4.8  HGB 13.5 13.4 12.4 11.2*  HCT 40.3 40.3 37.9 34.2*  MCV 92.2 93.5 95.2 95.0  PLT 144* 119* 104* 412*   Basic Metabolic Panel: Recent Labs  Lab 07/16/20 1449 07/17/20 1720 07/18/20 0410 07/19/20 0426  NA 136 135 141 140  K 3.6 3.3* 4.0 4.0  CL 104 102 107 107  CO2 26 24 24  26  GLUCOSE 101* 114* 162* 134*  BUN 8 12 9 13   CREATININE 0.84 0.90 0.76 0.73  CALCIUM 8.9 8.2* 8.4* 8.1*  MG  --   --  2.0  --    GFR: Estimated Creatinine Clearance: 61.1 mL/min (by C-G formula based on SCr of 0.73 mg/dL). Liver Function Tests: Recent Labs  Lab 07/16/20 1449 07/17/20 1720 07/18/20 0410  AST 38 85* 97*  ALT 28 65* 83*  ALKPHOS 98 90 93  BILITOT 0.6 0.6 0.8  PROT 6.3* 6.4* 5.7*  ALBUMIN 3.2* 3.3* 2.8*   Coagulation Profile: Recent Labs  Lab 07/17/20 1720 07/18/20 0410  INR 1.1 1.2   Sepsis Labs: Recent Labs  Lab 07/17/20 1720 07/17/20 2345 07/18/20 0410  PROCALCITON  --   --  0.95  LATICACIDVEN 1.1 1.7  --     Recent Results (from the past 240 hour(s))  Urine Culture     Status: Abnormal   Collection Time: 07/16/20  3:12 PM   Specimen: Urine, Clean Catch  Result Value Ref Range Status   Specimen Description   Final    URINE, CLEAN CATCH Performed at [facility], [place] [address], [place], [place] [address]    Special Requests   Final    NONE Performed at [facility], [place] [address], [place], [place] [address]    Culture (A)  Final    <10,000 COLONIES/mL  INSIGNIFICANT GROWTH Performed at [facility] [address], [place], [place] [address]    Report Status 07/17/2020 FINAL  Final  Culture, blood (Routine x 2)     Status: None (Preliminary result)   Collection Time: 07/17/20  5:20 PM   Specimen: Site Not Specified; Blood  Result Value Ref Range Status   Specimen Description   Final    SITE NOT SPECIFIED Performed at [place] [address] [place], [place] [address]    Special Requests   Final    BOTTLES DRAWN AEROBIC AND ANAEROBIC Blood Culture results may not be optimal due to an inadequate volume of blood received in culture bottles Performed at [facility] [address] [place], [place] [address]    Culture   Final    NO GROWTH 2 DAYS Performed at [facility] [address], [place], [place] [address]    Report Status PENDING  Incomplete  Culture, blood (Routine x 2)     Status: None (Preliminary result)   Collection Time: 07/17/20  5:21 PM   Specimen: Site Not Specified; Blood  Result Value Ref Range Status   Specimen Description   Final    SITE NOT SPECIFIED Performed at [facility] [address], [place], [place] [address]    Special Requests   Final    BOTTLES DRAWN AEROBIC AND ANAEROBIC Blood Culture adequate volume Performed at [facility] [address] [place], [place] [address]    Culture   Final    NO GROWTH 2 DAYS Performed at [facility] [address], [place],  [address]    Report Status PENDING  Incomplete  Respiratory Panel by RT PCR (Flu A&B, Covid) - Nasopharyngeal Swab     Status: None   Collection Time: 07/17/20  5:21 PM   Specimen: Nasopharyngeal Swab  Result Value Ref Range Status   SARS Coronavirus 2 by RT PCR NEGATIVE NEGATIVE Final    Comment: (NOTE) SARS-CoV-2 target nucleic  acids are NOT DETECTED.  The SARS-CoV-2 RNA is generally detectable in upper  respiratoy specimens during the acute phase of infection. The lowest concentration of SARS-CoV-2 viral copies this assay can detect is 131 copies/mL. A negative result does not preclude SARS-Cov-2 infection and should not be used as the sole basis for treatment or other patient management decisions. A negative result may occur with  improper specimen collection/handling, submission of specimen other than nasopharyngeal swab, presence of viral mutation(s) within the areas targeted by this assay, and inadequate number of viral copies (<131 copies/mL). A negative result must be combined with clinical observations, patient history, and epidemiological information. The expected result is Negative.  Fact Sheet for Patients:  PinkCheek.be  Fact Sheet for Healthcare Providers:  GravelBags.it  This test is no t yet approved or cleared by the [place] FDA and  has been authorized for detection and/or diagnosis of SARS-CoV-2 by FDA under an Emergency Use Authorization (EUA). This EUA will remain  in effect (meaning this test can be used) for the duration of the COVID-19 declaration under Section 564(b)(1) of the Act, 21 U.S.C. section 360bbb-3(b)(1), unless the authorization is terminated or revoked sooner.     Influenza A by PCR NEGATIVE NEGATIVE Final   Influenza B by PCR NEGATIVE NEGATIVE Final    Comment: (NOTE) The Xpert Xpress SARS-CoV-2/FLU/RSV assay is intended as an aid in  the diagnosis of influenza from Nasopharyngeal swab specimens and  should not be used as a sole basis for treatment. Nasal washings and  aspirates are unacceptable for Xpert Xpress SARS-CoV-2/FLU/RSV  testing.  Fact Sheet for Patients: PinkCheek.be  Fact Sheet for Healthcare Providers: GravelBags.it  This test is not yet approved or cleared by the [place] FDA and  has been  authorized for detection and/or diagnosis of SARS-CoV-2 by  FDA under an Emergency Use Authorization (EUA). This EUA will remain  in effect (meaning this test can be used) for the duration of the  Covid-19 declaration under Section 564(b)(1) of the Act, 21  U.S.C. section 360bbb-3(b)(1), unless the authorization is  terminated or revoked. Performed at [facility], [place] [address] [place], [place] [address]   Urine culture     Status: None   Collection Time: 07/17/20  6:16 PM   Specimen: In/Out Cath Urine  Result Value Ref Range Status   Specimen Description   Final    IN/OUT CATH URINE Performed at [facility] [address], [place], [place] [address]    Special Requests   Final    NONE Performed at [facility], [place] [address], [place], [place] [address]    Culture   Final    NO GROWTH Performed at [facility], [place] [address] [place], [place] [address]    Report Status 07/19/2020 FINAL  Final      Radiology Studies: DG Chest 2 View  Result Date: 07/17/2020 CLINICAL DATA:  69 year old female with suspected sepsis. EXAM: CHEST - 2 VIEW COMPARISON:  Chest radiograph dated 09/02/2019. FINDINGS: Right-sided Port-A-Cath with tip at the cavoatrial junction. There are bibasilar linear atelectasis. No focal consolidation, pleural effusion, or pneumothorax. There is background of mild chronic bronchitic changes. The cardiac silhouette is within limits. No acute osseous pathology. IMPRESSION: No active cardiopulmonary disease. Electronically Signed   By: Anner Crete M.D.   On: 07/17/2020 17:08     Scheduled Meds: . acyclovir  400 mg Oral BID  . ARIPiprazole  2 mg Oral  Daily  . aspirin  81 mg Oral QHS  . Chlorhexidine Gluconate Cloth  6 each Topical Daily  . enoxaparin (LOVENOX) injection  40 mg Subcutaneous Q24H  . iohexol  500 mL Oral Q1H  . melatonin  5 mg Oral QHS  . montelukast  10 mg Oral QHS  .  pantoprazole  40 mg Oral BID  . PARoxetine  40 mg Oral Daily  . pravastatin  40 mg Oral QHS  . sodium chloride flush  10-40 mL Intracatheter Q12H   Continuous Infusions: . aztreonam 2 g (07/19/20 0816)     LOS: 2 days    Donnamae Jude, MD 07/19/2020 3:37 PM [phone] [facility] If 7PM-7AM, please contact night-coverage 07/19/2020, 3:37 PM

## 2020-07-19 NOTE — Evaluation (Signed)
Physical Therapy Evaluation Patient Details Name: Alyssa Elliott MRN: [id number] DOB: [birth year] Today's Date: 07/19/2020   History of Present Illness  69 year old female with past medical history of lymphoma, GERD, dyslipidemia, depression, asthma, anxiety and vitamin D deficiency and pt admitted for sepsis due to urine infection and Non-Hodgkin's B-cell lymphoma, follicular lymphoma stage III, involving the scalp, axillary lymph nodes, hilar, infrahilar nodes, subcarinal node, mesenteric lymph nodes, uterus/cervix  Clinical Impression  Patient evaluated by Physical Therapy with no further acute PT needs identified. All education has been completed and the patient has no further questions.  Pt ambulated around unit and encouraged to mobilize (with mask) during acute stay.  Educated pt on some benefits of OP PT for cancer rehab if interested or needed in the future (reports extreme fatigue after previous treatment).  No current needs at this time.  See below for any follow-up Physical Therapy or equipment needs. PT is signing off. Thank you for this referral.     Follow Up Recommendations No PT follow up    Equipment Recommendations  None recommended by PT    Recommendations for Other Services       Precautions / Restrictions Precautions Precautions: None      Mobility  Bed Mobility Overal bed mobility: Independent                  Transfers Overall transfer level: Independent                  Ambulation/Gait Ambulation/Gait assistance: Modified independent (Device/Increase time)     Gait Pattern/deviations: WFL(Within Functional Limits)        Stairs            Wheelchair Mobility    Modified Rankin (Stroke Patients Only)       Balance Overall balance assessment: No apparent balance deficits (not formally assessed)                                           Pertinent Vitals/Pain Pain Assessment: No/denies pain     Home Living Family/patient expects to be discharged to:: Private residence Living Arrangements: Spouse/significant other   Type of Home: House       Home Layout: One level Home Equipment: None      Prior Function Level of Independence: Independent               Hand Dominance        Extremity/Trunk Assessment        Lower Extremity Assessment Lower Extremity Assessment: Overall WFL for tasks assessed    Cervical / Trunk Assessment Cervical / Trunk Assessment: Normal  Communication   Communication: No difficulties  Cognition Arousal/Alertness: Awake/alert Behavior During Therapy: WFL for tasks assessed/performed Overall Cognitive Status: Within Functional Limits for tasks assessed                                        General Comments      Exercises     Assessment/Plan    PT Assessment Patent does not need any further PT services  PT Problem List         PT Treatment Interventions      PT Goals (Current goals can be found in the Care Plan section)  Acute Rehab PT Goals  PT Goal Formulation: All assessment and education complete, DC therapy    Frequency     Barriers to discharge        Co-evaluation               AM-PAC PT "6 Clicks" Mobility  Outcome Measure Help needed turning from your back to your side while in a flat bed without using bedrails?: None Help needed moving from lying on your back to sitting on the side of a flat bed without using bedrails?: None Help needed moving to and from a bed to a chair (including a wheelchair)?: None Help needed standing up from a chair using your arms (e.g., wheelchair or bedside chair)?: None Help needed to walk in hospital room?: None Help needed climbing 3-5 steps with a railing? : None 6 Click Score: 24    End of Session   Activity Tolerance: Patient tolerated treatment well Patient left: in bed;with call bell/phone within reach;with family/visitor present Nurse  Communication: Mobility status PT Visit Diagnosis: Difficulty in walking, not elsewhere classified (R26.2)    Time: 5102-5852 PT Time Calculation (min) (ACUTE ONLY): 12 min   Charges:   PT Evaluation $PT Eval Low Complexity: 1 Low         Kati PT, DPT Acute Rehabilitation Services Pager: [phone] Office: [phone] York Ram E 07/19/2020, 2:59 PM

## 2020-07-19 NOTE — Evaluation (Signed)
Occupational Therapy Evaluation Patient Details Name: Alyssa Elliott MRN: [id number] DOB: 1951-04-09 Today's Date: 07/19/2020    History of Present Illness 69 year old female with past medical history of lymphoma, GERD, dyslipidemia, depression, asthma, anxiety and vitamin D deficiency and pt admitted for sepsis due to urine infection and Non-Hodgkin's B-cell lymphoma, follicular lymphoma stage III, involving the scalp, axillary lymph nodes, hilar, infrahilar nodes, subcarinal node, mesenteric lymph nodes, uterus/cervix   Clinical Impression   Pt mod I with ADL activity. No further OT needed    Follow Up Recommendations  No OT follow up          Precautions / Restrictions Precautions Precautions: Fall      Mobility Bed Mobility Overal bed mobility: Independent                  Transfers Overall transfer level: Independent                    Balance Overall balance assessment: No apparent balance deficits (not formally assessed)                                         ADL either performed or assessed with clinical judgement   ADL Overall ADL's : Independent                                             Vision Patient Visual Report: No change from baseline              Pertinent Vitals/Pain Pain Assessment: No/denies pain        Extremity/Trunk Assessment Upper Extremity Assessment Upper Extremity Assessment: Overall WFL for tasks assessed   Lower Extremity Assessment Lower Extremity Assessment: Overall WFL for tasks assessed   Cervical / Trunk Assessment Cervical / Trunk Assessment: Normal   Communication Communication Communication: No difficulties   Cognition Arousal/Alertness: Awake/alert Behavior During Therapy: WFL for tasks assessed/performed Overall Cognitive Status: Within Functional Limits for tasks assessed                                                Home  Living Family/patient expects to be discharged to:: Private residence Living Arrangements: Spouse/significant other   Type of Home: House Home Access: Stairs to enter     Home Layout: One level               Home Equipment: None          Prior Functioning/Environment Level of Independence: Independent                          OT Goals(Current goals can be found in the care plan section) Acute Rehab OT Goals Patient Stated Goal: home OT Goal Formulation: With patient  OT Frequency:      AM-PAC OT "6 Clicks" Daily Activity     Outcome Measure Help from another person eating meals?: None Help from another person taking care of personal grooming?: None Help from another person toileting, which includes using toliet, bedpan, or urinal?: None Help from another person bathing (including washing, rinsing, drying)?: None Help from  another person to put on and taking off regular upper body clothing?: None Help from another person to put on and taking off regular lower body clothing?: None 6 Click Score: 24   End of Session Nurse Communication: Mobility status  Activity Tolerance: Patient tolerated treatment well Patient left: in bed                   Time: 1235-1250 OT Time Calculation (min): 15 min Charges:  OT General Charges $OT Visit: 1 Visit OT Evaluation $OT Eval Low Complexity: 1 Low      [place], Thereasa Parkin 07/19/2020, 3:16 PM

## 2020-07-19 NOTE — Progress Notes (Signed)
Pt wants to talk to her oncologist first for the CT scan. MD aware. CT dept aware. On hold for now.

## 2020-07-19 NOTE — Progress Notes (Signed)
   07/19/20 2037  Assess: MEWS Score  Temp (!) 101.8 F (38.8 C)  BP (!) 131/58  Pulse Rate 100  Resp (!) 28  SpO2 93 %  O2 Device Room Air  Assess: MEWS Score  MEWS Temp 2  MEWS Systolic 0  MEWS Pulse 0  MEWS RR 2  MEWS LOC 0  MEWS Score 4  MEWS Score Color Red  Assess: if the MEWS score is Yellow or Red  Were vital signs taken at a resting state? Yes  Focused Assessment Change from prior assessment (see assessment flowsheet)  Early Detection of Sepsis Score *See Row Information* Low  MEWS guidelines implemented *See Row Information* Yes  Treat  MEWS Interventions Administered scheduled meds/treatments  Pain Scale 0-10  Pain Score 5  Pain Type Acute pain  Pain Location Head  Pain Orientation Right;Left  Complains of Other (Comment) (chills)  Interventions Medication (see MAR)  Take Vital Signs  Increase Vital Sign Frequency  Red: Q 1hr X 4 then Q 4hr X 4, if remains red, continue Q 4hrs  Escalate  MEWS: Escalate Red: discuss with charge nurse/RN and provider, consider discussing with RRT  Notify: Charge Nurse/RN  Name of Charge Nurse/RN Notified Pam, RN  Date Charge Nurse/RN Notified 07/19/20  Time Charge Nurse/RN Notified 2035  Notify: Provider  Provider Name/Title Kennon Holter, NP   Bolus order given per attending nurse.

## 2020-07-20 ENCOUNTER — Encounter: Payer: Medicare (Managed Care) | Attending: Specialist | Primary: Family

## 2020-07-20 ENCOUNTER — Encounter

## 2020-07-20 ENCOUNTER — Inpatient Hospital Stay (HOSPITAL_COMMUNITY): Payer: Medicare Other

## 2020-07-20 ENCOUNTER — Telehealth: Payer: Self-pay | Admitting: *Deleted

## 2020-07-20 LAB — CBC WITH DIFFERENTIAL/PLATELET
Abs Immature Granulocytes: 0.03 10*3/uL (ref 0.00–0.07)
Basophils Absolute: 0 10*3/uL (ref 0.0–0.1)
Basophils Relative: 1 %
Eosinophils Absolute: 0 10*3/uL (ref 0.0–0.5)
Eosinophils Relative: 1 %
HCT: 31.4 % — ABNORMAL LOW (ref 36.0–46.0)
Hemoglobin: 10.2 g/dL — ABNORMAL LOW (ref 12.0–15.0)
Immature Granulocytes: 1 %
Lymphocytes Relative: 7 %
Lymphs Abs: 0.3 10*3/uL — ABNORMAL LOW (ref 0.7–4.0)
MCH: 31.2 pg (ref 26.0–34.0)
MCHC: 32.5 g/dL (ref 30.0–36.0)
MCV: 96 fL (ref 80.0–100.0)
Monocytes Absolute: 0.4 10*3/uL (ref 0.1–1.0)
Monocytes Relative: 9 %
Neutro Abs: 3.1 10*3/uL (ref 1.7–7.7)
Neutrophils Relative %: 81 %
Platelets: 100 10*3/uL — ABNORMAL LOW (ref 150–400)
RBC: 3.27 MIL/uL — ABNORMAL LOW (ref 3.87–5.11)
RDW: 13.7 % (ref 11.5–15.5)
WBC: 3.9 10*3/uL — ABNORMAL LOW (ref 4.0–10.5)
nRBC: 0 % (ref 0.0–0.2)

## 2020-07-20 LAB — CBC
HCT: 32.7 % — ABNORMAL LOW (ref 36.0–46.0)
Hemoglobin: 10.6 g/dL — ABNORMAL LOW (ref 12.0–15.0)
MCH: 31 pg (ref 26.0–34.0)
MCHC: 32.4 g/dL (ref 30.0–36.0)
MCV: 95.6 fL (ref 80.0–100.0)
Platelets: 107 10*3/uL — ABNORMAL LOW (ref 150–400)
RBC: 3.42 MIL/uL — ABNORMAL LOW (ref 3.87–5.11)
RDW: 13.5 % (ref 11.5–15.5)
WBC: 4.6 10*3/uL (ref 4.0–10.5)
nRBC: 0 % (ref 0.0–0.2)

## 2020-07-20 LAB — COMPREHENSIVE METABOLIC PANEL
ALT: 71 U/L — ABNORMAL HIGH (ref 0–44)
AST: 54 U/L — ABNORMAL HIGH (ref 15–41)
Albumin: 2.5 g/dL — ABNORMAL LOW (ref 3.5–5.0)
Alkaline Phosphatase: 97 U/L (ref 38–126)
Anion gap: 8 (ref 5–15)
BUN: 14 mg/dL (ref 8–23)
CO2: 24 mmol/L (ref 22–32)
Calcium: 7.7 mg/dL — ABNORMAL LOW (ref 8.9–10.3)
Chloride: 106 mmol/L (ref 98–111)
Creatinine, Ser: 0.75 mg/dL (ref 0.44–1.00)
GFR, Estimated: >60 mL/min (ref >60)
Glucose, Bld: 92 mg/dL (ref 70–99)
Potassium: 3.6 mmol/L (ref 3.5–5.1)
Sodium: 138 mmol/L (ref 135–145)
Total Bilirubin: 0.5 mg/dL (ref 0.3–1.2)
Total Protein: 5 g/dL — ABNORMAL LOW (ref 6.5–8.1)

## 2020-07-20 LAB — LACTATE DEHYDROGENASE: LDH: 180 U/L (ref 98–192)

## 2020-07-20 LAB — URIC ACID: Uric Acid, Serum: 2.9 mg/dL (ref 2.5–7.1)

## 2020-07-20 MED ORDER — IOHEXOL 9 MG/ML PO SOLN: 500.0000 mL | ORAL | Status: AC

## 2020-07-20 MED ORDER — IOHEXOL 300 MG/ML  SOLN
100.0000 mL | Freq: Once | INTRAMUSCULAR | Status: AC | PRN
  Administered 2020-07-20: 100 mL via INTRAVENOUS

## 2020-07-20 MED ORDER — IOHEXOL 9 MG/ML PO SOLN
ORAL | Status: AC
  Filled 2020-07-20: qty 1000

## 2020-07-20 MED ORDER — IOHEXOL 350 MG/ML SOLN: 100.0000 mL | Freq: Once | INTRAVENOUS | Status: DC | PRN

## 2020-07-20 NOTE — Progress Notes (Signed)
[facility] Progress Note   Alyssa Elliott [id number] 1951-02-15 69 y.o.  Alyssa Elliott is managed by Dr. 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  A CBC was completed which showed a WBC of 4.4 with an ANC of 3.7.  Alyssa Elliott was given a prescription for Macrobid 100 mg p.o. twice daily and Pyridium.  Alyssa Elliott was told to take Tylenol as needed for fevers.  Diffuse follicular center lymphoma: Alyssa Elliott is status post cycle 1, day 1 of Ruxience and Bendeka which was dosed on 07/06/2020.  Alyssa Elliott is scheduled to return to see Dr. Lorenso Courier on 07/23/2020.  Please see After Visit Summary for Alyssa Elliott specific instructions.  Future Appointments  Date Time Provider [place]  07/23/2020 11:00 AM [facility] LAB [facility] None  07/23/2020 11:15 AM [facility] INFUSION [facility] None  07/23/2020 11:30 AM Orson Slick, MD [facility] None  08/05/2020  2:00 PM Salvadore Dom, MD [place] None  08/06/2020  8:00 AM [facility] LAB [facility] None  08/06/2020  8:15 AM [facility] [place] FLUSH [facility] None  08/06/2020  8:30 AM Ledell Peoples IV, MD [facility] None  08/06/2020  9:30 AM [facility] INFUSION [facility] None  08/07/2020  9:00 AM [facility] INFUSION [facility] None    No orders of the defined types were placed in this encounter.      Subjective:   Alyssa Elliott ID:  Alyssa Elliott is a 69 y.o. (DOB 01/04/1951) female.  Chief Complaint:  Chief Complaint  Alyssa Elliott presents with  .  Fever  . Chills    HPI Alyssa Elliott is a 69 y.o. female with a diagnosis of a diffuse follicular center lymphoma.  Alyssa Elliott is followed by Dr. Narda Rutherford and is status post cycle 1, day 1 of Ruxience and Bendeka which was dosed on 07/06/2020.  Alyssa Elliott presents to the clinic today with her husband.  Alyssa Elliott reports having chills, fevers, fatigue, dizziness, nausea, mild diarrhea, suprapubic pressure and dysuria.  Her temperature has been around 100.0 or slightly higher.   Medications: I have reviewed the Alyssa Elliott's current medications.  Allergies:  Allergies  Allergen Reactions  . Codeine Nausea And Vomiting  . Penicillins Swelling and Other (See Comments)    Swelling  Of joints  Did it involve swelling of the face/tongue/throat, SOB, or low BP? No Did it involve sudden or severe rash/hives, skin peeling, or any reaction on the inside of your mouth or nose? No Did you need to seek medical attention at a hospital or doctor's office? No When did it last happen? Childhood allergy **ALLERGIC TO CEPHALOSPORINS**   . Keflex [Cephalexin] Rash  . Latex Rash and Other (See Comments)    redness  . Sulfa Antibiotics Rash    Past Medical History:  Diagnosis Date  . Allergy   . Anxiety   . Asthma   . Cataract 2018   removed  . Complication of anesthesia    drop in BP  . DDD (degenerative disc disease)   . Depression   . GERD (gastroesophageal reflux disease)   . Hemorrhoids   . Hyperlipidemia   . Lymphoma ([place])  dx 2021   no tx for now  . Vitamin D deficiency     Past Surgical History:  Procedure Laterality Date  . ATRIAL ABLATION SURGERY  1997   SVT  . cataract surgery Bilateral 2017  . COLONOSCOPY  2016  . FOOT NEUROMA SURGERY     multiple times bilaterally  . IR IMAGING GUIDED PORT INSERTION  07/13/2020  . PLANTAR FASCIA RELEASE Left 1999 or 2000  . SHOULDER ARTHROSCOPY Left 04/19/2019   Procedure: LEFT SHOULDER MANIPULATION UNDER ANESTHESIA AND ARTHROSCOPY WITH EXTENSIVE  DEBRIDEMENT;  Surgeon: Mcarthur Rossetti, MD;  Location: [facility];  Service: Orthopedics;  Laterality: Left;  . UPPER GI ENDOSCOPY  yrs ago    Family History  Problem Relation Age of Onset  . Heart disease Mother   . Diabetes Mother   . Tuberculosis Paternal Grandfather   . Bone cancer Paternal Grandfather   . Breast cancer Sister 78  . Melanoma Sister   . Cirrhosis Maternal Grandfather   . Heart failure Paternal Grandmother   . Colon cancer Neg Hx   . Ovarian cancer Neg Hx   . Uterine cancer Neg Hx     Social History   Socioeconomic History  . Marital status: Married    Spouse name: Not on file  . Number of children: 0  . Years of education: Not on file  . Highest education level: Not on file  Occupational History  . Occupation: retired Catering manager  Tobacco Use  . Smoking status: Never Smoker  . Smokeless tobacco: Never Used  Vaping Use  . Vaping Use: Never used  Substance and Sexual Activity  . Alcohol use: No  . Drug use: No  . Sexual activity: Not Currently    Partners: Male    Birth control/protection: Post-menopausal  Other Topics Concern  . Not on file  Social History Narrative  . Not on file   Social Determinants of Health   Financial Resource Strain:   . Difficulty of Paying Living Expenses: Not on file  Food Insecurity:   . Worried About Charity fundraiser in the Last Year: Not on file  . Ran Out of Food in the Last Year: Not on file  Transportation Needs:   . Lack of Transportation (Medical): Not on file  . Lack of Transportation (Non-Medical): Not on file  Physical Activity:   . Days of Exercise per Week: Not on file  . Minutes of Exercise per Session: Not on file  Stress:   . Feeling of Stress : Not on file  Social Connections:   . Frequency of Communication with Friends and Family: Not on file  . Frequency of Social Gatherings with Friends and Family: Not on file  . Attends Religious Services: Not on file  . Active Member of  Clubs or Organizations: Not on file  . Attends Archivist Meetings: Not on file  . Marital Status: Not on file  Intimate Partner Violence:   . Fear of Current or Ex-Partner: Not on file  . Emotionally Abused: Not on file  . Physically Abused: Not on file  . Sexually Abused: Not on file    Past Medical History, Surgical history, Social history, and Family history were reviewed and updated as appropriate.   Please see review of systems for further details on the Alyssa Elliott's review from today.   Review of Systems:  Review of Systems  Constitutional: Positive for fatigue and fever. Negative for chills and diaphoresis.  HENT: Negative for  trouble swallowing.   Respiratory: Negative for cough and shortness of breath.   Cardiovascular: Negative for chest pain, palpitations and leg swelling.  Gastrointestinal: Positive for diarrhea and nausea. Negative for abdominal pain, constipation and vomiting.  Genitourinary: Positive for dysuria. Negative for difficulty urinating, flank pain, frequency, hematuria and urgency.  Skin: Negative for rash.  Neurological: Positive for dizziness. Negative for headaches.    Objective:   Physical Exam:  BP 129/61   Pulse 88   Temp 99.5 F (37.5 C) (Oral)   Resp 18   LMP 09/04/2002 (Exact Date)   SpO2 96%  ECOG: 0  Physical Exam Constitutional:      General: Alyssa Elliott is not in acute distress.    Appearance: Alyssa Elliott is not diaphoretic.  HENT:     Head: Normocephalic and atraumatic.     Mouth/Throat:     Mouth: Mucous membranes are moist.     Pharynx: No oropharyngeal exudate or posterior oropharyngeal erythema.  Eyes:     General: No scleral icterus.       Right eye: No discharge.        Left eye: No discharge.     Conjunctiva/sclera: Conjunctivae normal.  Cardiovascular:     Rate and Rhythm: Normal rate and regular rhythm.     Heart sounds: Normal heart sounds. No murmur heard.  No friction rub. No gallop.   Pulmonary:     Effort:  Pulmonary effort is normal. No respiratory distress.     Breath sounds: Normal breath sounds. No wheezing or rales.  Abdominal:     General: Bowel sounds are normal. There is no distension.     Palpations: Abdomen is soft.     Tenderness: There is no abdominal tenderness. There is no guarding or rebound.  Skin:    General: Skin is warm and dry.  Neurological:     Mental Status: Alyssa Elliott is alert.     Coordination: Coordination normal.     Gait: Gait normal.  Psychiatric:        Behavior: Behavior normal.        Thought Content: Thought content normal.        Judgment: Judgment normal.     Lab Review:     Component Value Date/Time   NA 138 07/20/2020 0336   K 3.6 07/20/2020 0336   CL 106 07/20/2020 0336   CO2 24 07/20/2020 0336   GLUCOSE 92 07/20/2020 0336   BUN 14 07/20/2020 0336   CREATININE 0.75 07/20/2020 0336   CREATININE 0.84 07/16/2020 1449   CALCIUM 7.7 (L) 07/20/2020 0336   PROT 5.0 (L) 07/20/2020 0336   ALBUMIN 2.5 (L) 07/20/2020 0336   AST 54 (H) 07/20/2020 0336   AST 38 07/16/2020 1449   ALT 71 (H) 07/20/2020 0336   ALT 28 07/16/2020 1449   ALKPHOS 97 07/20/2020 0336   BILITOT 0.5 07/20/2020 0336   BILITOT 0.6 07/16/2020 1449   GFRNONAA >60 07/20/2020 0336   GFRNONAA >60 07/16/2020 1449   GFRAA >60 04/29/2020 0729       Component Value Date/Time   WBC 4.6 07/20/2020 0336   RBC 3.42 (L) 07/20/2020 0336   HGB 10.6 (L) 07/20/2020 0336   HGB 13.5 07/16/2020 1449   HCT 32.7 (L) 07/20/2020 0336   PLT 107 (L) 07/20/2020 0336   PLT 144 (L) 07/16/2020 1449   MCV 95.6 07/20/2020 0336   MCH 31.0 07/20/2020 0336   MCHC 32.4 07/20/2020 0336   RDW 13.5 07/20/2020 0336   LYMPHSABS  0.3 (L) 07/19/2020 0426   MONOABS 0.5 07/19/2020 0426   EOSABS 0.0 07/19/2020 0426   BASOSABS 0.0 07/19/2020 0426   -------------------------------  Imaging from last 24 hours (if applicable):  Radiology interpretation: DG Chest 2 View  Result Date: 07/17/2020 CLINICAL DATA:   69 year old female with suspected sepsis. EXAM: CHEST - 2 VIEW COMPARISON:  Chest radiograph dated 09/02/2019. FINDINGS: Right-sided Port-A-Cath with tip at the cavoatrial junction. There are bibasilar linear atelectasis. No focal consolidation, pleural effusion, or pneumothorax. There is background of mild chronic bronchitic changes. The cardiac silhouette is within limits. No acute osseous pathology. IMPRESSION: No active cardiopulmonary disease. Electronically Signed   By: Anner Crete M.D.   On: 07/17/2020 17:08   CT CHEST ABDOMEN PELVIS W CONTRAST  Result Date: 06/25/2020 CLINICAL DATA:  Follow-up B-cell follicular lymphoma. EXAM: CT CHEST, ABDOMEN, AND PELVIS WITH CONTRAST TECHNIQUE: Multidetector CT imaging of the chest, abdomen and pelvis was performed following the standard protocol during bolus administration of intravenous contrast. CONTRAST:  169mL OMNIPAQUE IOHEXOL 300 MG/ML  SOLN COMPARISON:  PET-CT on 03/30/2020 FINDINGS: CT CHEST FINDINGS Cardiovascular: No acute findings. Mediastinum/Lymph Nodes: 10 mm left axillary lymph node is seen on image 14/2, without significant change since previous study. No other pathologically enlarged lymph nodes identified. Lungs/Pleura: No pulmonary infiltrate or mass identified. No effusion present. Musculoskeletal:  No suspicious bone lesions identified. CT ABDOMEN AND PELVIS FINDINGS Hepatobiliary: No masses identified. Gallbladder is unremarkable. No evidence of biliary ductal dilatation. Pancreas:  No mass or inflammatory changes. Spleen:  Within normal limits in size and appearance. Adrenals/Urinary tract:  No masses or hydronephrosis. Stomach/Bowel: No evidence of obstruction, inflammatory process, or abnormal fluid collections. Vascular/Lymphatic: Mild mesenteric lymphadenopathy is seen with lymph nodes measuring up to 12 mm, without significant change since prior exam. No new or increased areas of lymphadenopathy identified. No abdominal aortic  aneurysm. Reproductive: A soft tissue mass is seen involving the vagina which measures 6.5 x 5.7 cm on image 110/2. This shows increased size compared to 5.8 x 3.6 cm on prior exam. This mass now appears to involve the posterior wall of the urinary bladder. Adnexal regions are unremarkable. Other:  None. Musculoskeletal:  No suspicious bone lesions identified. IMPRESSION: Mild increase in size of soft tissue mass involving the vagina, which now appears to involve the posterior wall of urinary bladder. Stable mild mesenteric and left axillary lymphadenopathy. No new or progressive lymphadenopathy identified. Electronically Signed   By: Marlaine Hind M.D.   On: 06/25/2020 20:07   DG Bone Density  Result Date: 07/01/2020 EXAM: DUAL X-RAY ABSORPTIOMETRY (DXA) FOR BONE MINERAL DENSITY IMPRESSION: Referring Physician:  Salvadore Dom Your Alyssa Elliott completed a BMD test using Lunar IDXA DXA system ( analysis version: 16 ) manufactured by [employer]. Technologist: AD Alyssa Elliott: Name: Ronesha, Heenan Alyssa Elliott ID: [id number] Birth Date: 01-28-1951 Height: 63.5 in. Sex: Female Measured: 06/30/2020 Weight: 159.0 lbs. Indications: Advanced Age, Caucasian, Dexilant, Estrogen Deficient, Family Hist. (Parent hip fracture), Height Loss (781.91), History of Fracture (Adult) (V15.51), History of Osteopenia, Lexapro, Lymphoma, Pantoprazole, Paxil, Postmenopausal, scoliosis, Secondary Osteoporosis, Vitamin D Deficient Fractures: Toe Treatments: Vitamin D (E933.5) ASSESSMENT: The BMD measured at AP Spine L1-L2 is 0.929 g/cm2 with a T-score of -2.0. This Alyssa Elliott is considered osteopenic according to [facility]) criteria. The scan quality is good. L-3 and L-4 were excluded due to degenerative changes. Site Region Measured Date Measured Age YA BMD Significant CHANGE T-score AP Spine  L1-L2  06/30/2020    71.4         -2.0    0.929 g/cm2 AP Spine  L1-L2      08/31/2017    68.5         -1.9    0.949 g/cm2  DualFemur Neck Left  06/30/2020    71.4         -1.6    0.819 g/cm2 DualFemur Neck Left 08/31/2017 68.5 -1.3 0.854 g/cm2 * DualFemur Total Mean 4/30/2020    71.4         -1.1    0.873 g/cm2 DualFemur Total Mean 08/31/2017    68.5         -1.0    0.887 g/cm2 World Health Organization [facility]) criteria for post-menopausal, Caucasian Women: Normal       T-score at or above -1 SD Osteopenia   T-score between -1 and -2.5 SD Osteoporosis T-score at or below -2.5 SD RECOMMENDATION: 1. All patients should optimize calcium and vitamin D intake. 2. Consider FDA approved medical therapies in postmenopausal women and men aged 67 years and older, based on the following: a. A hip or vertebral (clinical or morphometric) fracture b. T- score < or = -2.5 at the femoral neck or spine after appropriate evaluation to exclude secondary causes c. Low bone mass (T-score between -1.0 and -2.5 at the femoral neck or spine) and a 10 year probability of a hip fracture > or = 3% or a 10 year probability of a major osteoporosis-related fracture > or = 20% based on the [place]-adapted WHO algorithm d. Clinician judgment and/or Alyssa Elliott preferences may indicate treatment for people with 10-year fracture probabilities above or below these levels FOLLOW-UP: Patients with diagnosis of osteoporosis or at high risk for fracture should have regular bone mineral density tests. For patients eligible for Medicare, routine testing is allowed once every 2 years. The testing frequency can be increased to one year for patients who have rapidly progressing disease, those who are receiving or discontinuing medical therapy to restore bone mass, or have additional risk factors. FRAX* 10-year Probability of Fracture Based on femoral neck BMD: DualFemur (Left) Major Osteoporotic Fracture: 24.9% Hip Fracture:                6.6% Population:                  [place] (Caucasian) Risk Factors: Family Hist. (Parent hip fracture), History of Fracture (Adult) (V15.51), Secondary  Osteoporosis *FRAX is a [place] of [employer] for Metabolic Bone Disease, a [place] (WHO) [facility]. ASSESSMENT: The probability of a major osteoporotic fracture is 24.9% within the next ten years. The probability of a hip fracture is 6.6% within the next ten years. Electronically Signed   By: Marlaine Hind M.D.   On: 07/01/2020 08:14   IR IMAGING GUIDED PORT INSERTION  Result Date: 07/13/2020 INDICATION: 69 year old female with history of lymphoma requiring central venous access for chemotherapy. EXAM: IMPLANTED PORT A CATH PLACEMENT WITH ULTRASOUND AND FLUOROSCOPIC GUIDANCE COMPARISON:  None. MEDICATIONS: Clindamycin 900 mg IV; The antibiotic was administered within an appropriate time interval prior to skin puncture. ANESTHESIA/SEDATION: Moderate (conscious) sedation was employed during this procedure. A total of Versed 4 mg and Fentanyl 100 mcg was administered intravenously. Moderate Sedation Time: 20 minutes. The Alyssa Elliott's level of consciousness and vital signs were monitored continuously by radiology nursing throughout the procedure under my direct supervision. CONTRAST:  None FLUOROSCOPY TIME:  0.1  minutes, (2 mGy) COMPLICATIONS: None immediate. PROCEDURE: The procedure, risks, benefits, and alternatives were explained to the Alyssa Elliott. Questions regarding the procedure were encouraged and answered. The Alyssa Elliott understands and consents to the procedure. The right neck and chest were prepped with chlorhexidine in a sterile fashion, and a sterile drape was applied covering the operative field. Maximum barrier sterile technique with sterile gowns and gloves were used for the procedure. A timeout was performed prior to the initiation of the procedure. Ultrasound was used to examine the jugular vein which was compressible and free of internal echoes. A skin marker was used to demarcate the planned venotomy and port pocket incision sites.  Local anesthesia was provided to these sites and the subcutaneous tunnel track with 1% lidocaine with 1:100,000 epinephrine. A small incision was created at the jugular access site and blunt dissection was performed of the subcutaneous tissues. Under real time ultrasound guidance, the jugular vein was accessed with a 21 ga micropuncture needle and an 0.018" wire was inserted to the superior vena cava. A 5 Fr micopuncture set was then used, through which a 0.035" Rosen wire was passed under fluoroscopic guidance into the inferior vena cava. An 8 Fr dilator was then placed over the wire. A subcutaneous port pocket was then created along the upper chest wall utilizing a combination of sharp and blunt dissection. The pocket was irrigated with sterile saline, packed with gauze, and observed for hemorrhage. A single lumen "ISP" sized power injectable port was chosen for placement. The 8 Fr catheter was tunneled from the port pocket site to the venotomy incision. The port was placed in the pocket. The external catheter was trimmed to appropriate length. The dilator was exchanged for an 8 Fr peel-away sheath under fluoroscopic guidance. The catheter was then placed through the sheath and the sheath was removed. Final catheter positioning was confirmed and documented with a fluoroscopic spot radiograph. The port was accessed with a Huber needle, aspirated, and flushed with heparinized saline. The deep dermal layer of the port pocket incision was closed with interrupted 3-0 Vicryl suture. The skin was opposed with a running subcuticular 4-0 Monocryl suture. Dermabond was then placed over the port pocket and neck incisions. The Alyssa Elliott tolerated the procedure well without immediate post procedural complication. FINDINGS: After catheter placement, the tip lies within the atrial junction the catheter aspirates and flushes normally and is ready for immediate use. IMPRESSION: Successful placement of a power injectable Port-A-Cath  via the right internal jugular vein. The catheter is ready for immediate use. Ruthann Cancer, MD [facility] Radiology Electronically Signed   By: Ruthann Cancer MD   On: 07/13/2020 13:23

## 2020-07-20 NOTE — Progress Notes (Signed)
[place] [phone]   [phone]  INPATIENT PROGRESS NOTE  Patient Care Team: Prince Solian, MD as PCP - General (Internal Medicine) Mcarthur Rossetti, MD as Consulting Physician (Orthopedic Surgery)  Hematological/Oncological History # Non-Hodgkin Elliott Cell Lymphoma, Follicular Lymphoma. Stage III 1) 02/25/2020: patient underwent excision of a 1.3 cm mobile nodule on the left vertex of the scalp. 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  2) 03/18/2020: Establish care with Dr. Lorenso Courier  3) 03/30/2020: PET CT scan showed  involvement of the left scalp, left axillary lymph node, left hilar and infrahilar nodes, subcarinal node, and mesenteric lymph nodes, as well as a masslike appearance/ high metabolic activity in the lower uterus/cervix.  4) 04/21/2020: biopsy of the mass in the lower uterus/cervix consistent with follicular lymphoma.  5) 06/25/2020: CT C/A/P showed the soft tissue mass in the abdomen increase in size from 5.8 x 3.6 cm to 6.5 x 5.7 cm. The other lymphadenopathy was stable. Patient elected to move forward with treatment.   Interval History:  Alyssa Elliott 69 y.o. female with medical history significant for diffuse non Hodgkin Elliott cell lymphoma who is currently admitted for sepsis secondary to UTI.  She is currently receiving IV antibiotics and blood cultures remain negative to date.  A CT of the chest/abdomen/pelvis has been recommended, but the patient has declined this test so far pending discussion with oncology.  She continues to have fevers up to 103.  Reports chills.  The patient is very weak and tired.  She denies other complaints such as chest pain, shortness of breath, abdominal pain, nausea, vomiting. A full 10 point ROS is listed below.  MEDICAL HISTORY:  Past Medical History:  Diagnosis Date  . Allergy   . Anxiety   . Asthma   . Cataract 2018   removed   . Complication of anesthesia    drop in BP  . DDD (degenerative disc disease)   . Depression   . GERD (gastroesophageal reflux disease)   . Hemorrhoids   . Hyperlipidemia   . Lymphoma ([place]) dx 2021   no tx for now  . Vitamin D deficiency     SURGICAL HISTORY: Past Surgical History:  Procedure Laterality Date  . ATRIAL ABLATION SURGERY  1997   SVT  . cataract surgery Bilateral 2017  . COLONOSCOPY  2016  . FOOT NEUROMA SURGERY     multiple times bilaterally  . IR IMAGING GUIDED PORT INSERTION  07/13/2020  . PLANTAR FASCIA RELEASE Left 1999 or 2000  . SHOULDER ARTHROSCOPY Left 04/19/2019   Procedure: LEFT SHOULDER MANIPULATION UNDER ANESTHESIA AND ARTHROSCOPY WITH EXTENSIVE DEBRIDEMENT;  Surgeon: Mcarthur Rossetti, MD;  Location: [facility];  Service: Orthopedics;  Laterality: Left;  . UPPER GI ENDOSCOPY  yrs ago    SOCIAL HISTORY: Social History   Socioeconomic History  . Marital status: Married    Spouse name: Not on file  . Number of children: 0  . Years of education: Not on file  . Highest education level: Not on file  Occupational History  . Occupation: retired Catering manager  Tobacco Use  . Smoking status: Never Smoker  . Smokeless tobacco: Never Used  Vaping Use  . Vaping Use: Never used  Substance and Sexual Activity  . Alcohol use: No  . Drug use: No  . Sexual activity: Not Currently    Partners: Male    Birth control/protection: Post-menopausal  Other Topics Concern  . Not  on file  Social History Narrative  . Not on file   Social Determinants of Health   Financial Resource Strain:   . Difficulty of Paying Living Expenses: Not on file  Food Insecurity:   . Worried About Charity fundraiser in the Last Year: Not on file  . Ran Out of Food in the Last Year: Not on file  Transportation Needs:   . Lack of Transportation (Medical): Not on file  . Lack of Transportation (Non-Medical): Not on file  Physical Activity:   . Days of Exercise per Week:  Not on file  . Minutes of Exercise per Session: Not on file  Stress:   . Feeling of Stress : Not on file  Social Connections:   . Frequency of Communication with Friends and Family: Not on file  . Frequency of Social Gatherings with Friends and Family: Not on file  . Attends Religious Services: Not on file  . Active Member of Clubs or Organizations: Not on file  . Attends Archivist Meetings: Not on file  . Marital Status: Not on file  Intimate Partner Violence:   . Fear of Current or Ex-Partner: Not on file  . Emotionally Abused: Not on file  . Physically Abused: Not on file  . Sexually Abused: Not on file    FAMILY HISTORY: Family History  Problem Relation Age of Onset  . Heart disease Mother   . Diabetes Mother   . Tuberculosis Paternal Grandfather   . Bone cancer Paternal Grandfather   . Breast cancer Sister 12  . Melanoma Sister   . Cirrhosis Maternal Grandfather   . Heart failure Paternal Grandmother   . Colon cancer Neg Hx   . Ovarian cancer Neg Hx   . Uterine cancer Neg Hx     ALLERGIES:  is allergic to codeine, penicillins, keflex [cephalexin], latex, and sulfa antibiotics.  MEDICATIONS:  Current Facility-Administered Medications  Medication Dose Route Frequency Provider Last Rate Last Admin  . acetaminophen (TYLENOL) tablet 650 mg  650 mg Oral Q6H PRN Alyssa Elliott, Alyssa B, DO   650 mg at 07/20/20 5573   Or  . acetaminophen (TYLENOL) suppository 650 mg  650 mg Rectal Q6H PRN Alyssa Elliott, Alyssa B, DO      . acyclovir (ZOVIRAX) tablet 400 mg  400 mg Oral BID Alyssa Elliott, Alyssa B, DO   400 mg at 07/20/20 0920  . ARIPiprazole (ABILIFY) tablet 2 mg  2 mg Oral Daily Alyssa Elliott, Alyssa B, DO   2 mg at 07/20/20 0920  . aspirin chewable tablet 81 mg  81 mg Oral QHS Alyssa Elliott, Alyssa B, DO   81 mg at 07/19/20 2325  . aztreonam (AZACTAM) 2 g in sodium chloride 0.9 % 100 mL IVPB  2 g Intravenous Q8H Alyssa Elliott, Alyssa B, DO 200 mL/hr at 07/20/20 0926 2 g at  07/20/20 0926  . Chlorhexidine Gluconate Cloth 2 % PADS 6 each  6 each Topical Daily Alyssa Elliott, Alyssa B, DO   6 each at 07/20/20 0922  . enoxaparin (LOVENOX) injection 40 mg  40 mg Subcutaneous Q24H Arrien, Jimmy Picket, MD   40 mg at 07/19/20 1751  . iohexol (OMNIPAQUE) 9 MG/ML oral solution 500 mL  500 mL Oral Q1H Donnamae Jude, MD      . iohexol (OMNIPAQUE) 9 MG/ML oral solution           . levalbuterol (XOPENEX HFA) inhaler 1 puff  1 puff Inhalation Q4H PRN Alyssa Elliott, Alyssa B, DO      .  melatonin tablet 5 mg  5 mg Oral QHS Alyssa Elliott, Alyssa B, DO   5 mg at 07/19/20 2326  . montelukast (SINGULAIR) tablet 10 mg  10 mg Oral QHS Alyssa Elliott, Alyssa B, DO   10 mg at 07/19/20 2326  . ondansetron (ZOFRAN) tablet 4 mg  4 mg Oral Q6H PRN Alyssa Elliott, Alyssa B, DO       Or  . ondansetron (ZOFRAN) injection 4 mg  4 mg Intravenous Q6H PRN Alyssa Elliott, Alyssa B, DO      . oxyCODONE (Oxy IR/ROXICODONE) immediate release tablet 5 mg  5 mg Oral Q4H PRN Alyssa Elliott, Alyssa B, DO      . pantoprazole (PROTONIX) EC tablet 40 mg  40 mg Oral BID Alyssa Elliott, Alyssa B, DO   40 mg at 07/20/20 0920  . PARoxetine (PAXIL) tablet 40 mg  40 mg Oral Daily Alyssa Elliott, Alyssa B, DO   40 mg at 07/20/20 0920  . polyethylene glycol (MIRALAX / GLYCOLAX) packet 17 g  17 g Oral Daily PRN Alyssa Elliott, Alyssa B, DO      . pravastatin (PRAVACHOL) tablet 40 mg  40 mg Oral QHS Alyssa Elliott, Alyssa B, DO   40 mg at 07/19/20 2325  . sodium chloride flush (NS) 0.9 % injection 10-40 mL  10-40 mL Intracatheter Q12H Alyssa Elliott, Alyssa B, DO   10 mL at 07/20/20 0922  . sodium chloride flush (NS) 0.9 % injection 10-40 mL  10-40 mL Intracatheter PRN Alyssa Elliott, Alyssa B, DO        REVIEW OF SYSTEMS:   Constitutional: ( + ) fevers, ( + )  chills , ( - ) night sweats Eyes: ( - ) blurriness of vision, ( - ) double vision, ( - ) watery eyes Ears, nose, mouth, throat, and face: ( - ) mucositis, ( - ) sore throat Respiratory: ( - ) cough, ( - )  dyspnea, ( - ) wheezes Cardiovascular: ( - ) palpitation, ( - ) chest discomfort, ( - ) lower extremity swelling Gastrointestinal:  ( - ) nausea, ( - ) heartburn, ( - ) change in bowel habits Skin: ( - ) abnormal skin rashes Lymphatics: ( - ) new lymphadenopathy, ( - ) easy bruising Neurological: ( - ) numbness, ( - ) tingling, ( - ) new weaknesses Behavioral/Psych: ( - ) mood change, ( - ) new changes  All other systems were reviewed with the patient and are negative.  PHYSICAL EXAMINATION: ECOG PERFORMANCE STATUS: 2 - Symptomatic, <50% confined to bed  Vitals:   07/20/20 0646 07/20/20 0932  BP:  116/67  Pulse:  96  Resp:  18  Temp: 99.6 F (37.6 C) 99.6 F (37.6 C)  SpO2:  92%   Filed Weights   07/17/20 1646  Weight: 68 kg    GENERAL: Tired appearing female, no distress SKIN: skin color, texture, turgor are normal, no rashes or significant lesions EYES: conjunctiva are pink and non-injected, sclera clear LYMPH:  no palpable lymphadenopathy in the cervical, axillary or supraclavicular LUNGS: clear to auscultation and percussion with normal breathing effort HEART: regular rate & rhythm and no murmurs and no lower extremity edema Musculoskeletal: no cyanosis of digits and no clubbing  PSYCH: alert & oriented x 3, fluent speech NEURO: no focal motor/sensory deficits  LABORATORY DATA:  I have reviewed the data as listed CBC Latest Ref Rng & Units 07/20/2020 07/20/2020 07/19/2020  WBC 4.0 - 10.5 K/uL 3.9(L) 4.6 5.6  Hemoglobin 12.0 - 15.0 g/dL 10.2(L) 10.6(L) 11.2(L)  Hematocrit 36 -  46 % 31.4(L) 32.7(L) 34.2(L)  Platelets 150 - 400 K/uL 100(L) 107(L) 104(L)    CMP Latest Ref Rng & Units 07/20/2020 07/19/2020 07/18/2020  Glucose 70 - 99 mg/dL 92 134(H) 162(H)  BUN 8 - 23 mg/dL 14 13 9   Creatinine 0.44 - 1.00 mg/dL 0.75 0.73 0.76  Sodium 135 - 145 mmol/L 138 140 141  Potassium 3.5 - 5.1 mmol/L 3.6 4.0 4.0  Chloride 98 - 111 mmol/L 106 107 107  CO2 22 - 32 mmol/L 24 26 24    Calcium 8.9 - 10.3 mg/dL 7.7(L) 8.1(L) 8.4(L)  Total Protein 6.5 - 8.1 g/dL 5.0(L) - 5.7(L)  Total Bilirubin 0.3 - 1.2 mg/dL 0.5 - 0.8  Alkaline Phos 38 - 126 U/L 97 - 93  AST 15 - 41 U/L 54(H) - 97(H)  ALT 0 - 44 U/L 71(H) - 83(H)    No results found for: MPROTEIN No results found for: KPAFRELGTCHN, LAMBDASER, KAPLAMBRATIO   RADIOGRAPHIC STUDIES: I have personally reviewed the radiological images as listed and agreed with the findings in the report: FDG avid lesion on left scalp, left axilla, mediastinum, and abdominal lymph nodes. Uterus and cervix light up brightly on this scan.   DG Chest 2 View  Result Date: 07/17/2020 CLINICAL DATA:  69 year old female with suspected sepsis. EXAM: CHEST - 2 VIEW COMPARISON:  Chest radiograph dated 09/02/2019. FINDINGS: Right-sided Port-A-Cath with tip at the cavoatrial junction. There are bibasilar linear atelectasis. No focal consolidation, pleural effusion, or pneumothorax. There is background of mild chronic bronchitic changes. The cardiac silhouette is within limits. No acute osseous pathology. IMPRESSION: No active cardiopulmonary disease. Electronically Signed   By: Anner Crete M.D.   On: 07/17/2020 17:08   CT CHEST ABDOMEN PELVIS W CONTRAST  Result Date: 06/25/2020 CLINICAL DATA:  Follow-up Elliott-cell follicular lymphoma. EXAM: CT CHEST, ABDOMEN, AND PELVIS WITH CONTRAST TECHNIQUE: Multidetector CT imaging of the chest, abdomen and pelvis was performed following the standard protocol during bolus administration of intravenous contrast. CONTRAST:  128mL OMNIPAQUE IOHEXOL 300 MG/ML  SOLN COMPARISON:  PET-CT on 03/30/2020 FINDINGS: CT CHEST FINDINGS Cardiovascular: No acute findings. Mediastinum/Lymph Nodes: 10 mm left axillary lymph node is seen on image 14/2, without significant change since previous study. No other pathologically enlarged lymph nodes identified. Lungs/Pleura: No pulmonary infiltrate or mass identified. No effusion present.  Musculoskeletal:  No suspicious bone lesions identified. CT ABDOMEN AND PELVIS FINDINGS Hepatobiliary: No masses identified. Gallbladder is unremarkable. No evidence of biliary ductal dilatation. Pancreas:  No mass or inflammatory changes. Spleen:  Within normal limits in size and appearance. Adrenals/Urinary tract:  No masses or hydronephrosis. Stomach/Bowel: No evidence of obstruction, inflammatory process, or abnormal fluid collections. Vascular/Lymphatic: Mild mesenteric lymphadenopathy is seen with lymph nodes measuring up to 12 mm, without significant change since prior exam. No new or increased areas of lymphadenopathy identified. No abdominal aortic aneurysm. Reproductive: A soft tissue mass is seen involving the vagina which measures 6.5 x 5.7 cm on image 110/2. This shows increased size compared to 5.8 x 3.6 cm on prior exam. This mass now appears to involve the posterior wall of the urinary bladder. Adnexal regions are unremarkable. Other:  None. Musculoskeletal:  No suspicious bone lesions identified. IMPRESSION: Mild increase in size of soft tissue mass involving the vagina, which now appears to involve the posterior wall of urinary bladder. Stable mild mesenteric and left axillary lymphadenopathy. No new or progressive lymphadenopathy identified. Electronically Signed   By: Marlaine Hind M.D.   On: 06/25/2020 20:07  DG Bone Density  Result Date: 07/01/2020 EXAM: DUAL X-RAY ABSORPTIOMETRY (DXA) FOR BONE MINERAL DENSITY IMPRESSION: Referring Physician:  Salvadore Dom Your patient completed a BMD test using Lunar IDXA DXA system ( analysis version: 16 ) manufactured by [employer]. Technologist: AD PATIENT: Name: Adrian, Specht Patient ID: [id number] Birth Date: 1951/05/27 Height: 63.5 in. Sex: Female Measured: 06/30/2020 Weight: 159.0 lbs. Indications: Advanced Age, Caucasian, Dexilant, Estrogen Deficient, Family Hist. (Parent hip fracture), Height Loss (781.91), History of Fracture  (Adult) (V15.51), History of Osteopenia, Lexapro, Lymphoma, Pantoprazole, Paxil, Postmenopausal, scoliosis, Secondary Osteoporosis, Vitamin D Deficient Fractures: Toe Treatments: Vitamin D (E933.5) ASSESSMENT: The BMD measured at AP Spine L1-L2 is 0.929 g/cm2 with a T-score of -2.0. This patient is considered osteopenic according to [facility]) criteria. The scan quality is good. L-3 and L-4 were excluded due to degenerative changes. Site Region Measured Date Measured Age YA BMD Significant CHANGE T-score AP Spine  L1-L2      06/30/[age].4         -2.0    0.929 g/cm2 AP Spine  L1-L2      08/31/2017    68.5         -1.9    0.949 g/cm2 DualFemur Neck Left  06/30/2020    69.4         -1.6    0.819 g/cm2 DualFemur Neck Left 08/31/2017 66.5 -1.3 0.854 g/cm2 * DualFemur Total Mean 4/30/2020    71.4         -1.1    0.873 g/cm2 DualFemur Total Mean 08/31/2017    68.5         -1.0    0.887 g/cm2 World Health Organization [facility]) criteria for post-menopausal, Caucasian Women: Normal       T-score at or above -1 SD Osteopenia   T-score between -1 and -2.5 SD Osteoporosis T-score at or below -2.5 SD RECOMMENDATION: 1. All patients should optimize calcium and vitamin D intake. 2. Consider FDA approved medical therapies in postmenopausal women and men aged 17 years and older, based on the following: a. A hip or vertebral (clinical or morphometric) fracture Elliott. T- score < or = -2.5 at the femoral neck or spine after appropriate evaluation to exclude secondary causes c. Low bone mass (T-score between -1.0 and -2.5 at the femoral neck or spine) and a 10 year probability of a hip fracture > or = 3% or a 10 year probability of a major osteoporosis-related fracture > or = 20% based on the [place]-adapted WHO algorithm d. Clinician judgment and/or patient preferences may indicate treatment for people with 10-year fracture probabilities above or below these levels FOLLOW-UP: Patients with diagnosis of osteoporosis or at  high risk for fracture should have regular bone mineral density tests. For patients eligible for Medicare, routine testing is allowed once every 2 years. The testing frequency can be increased to one year for patients who have rapidly progressing disease, those who are receiving or discontinuing medical therapy to restore bone mass, or have additional risk factors. FRAX* 10-year Probability of Fracture Based on femoral neck BMD: DualFemur (Left) Major Osteoporotic Fracture: 24.9% Hip Fracture:                6.6% Population:                  [place] (Caucasian) Risk Factors: Family Hist. (Parent hip fracture), History of Fracture (Adult) (V15.51), Secondary Osteoporosis *FRAX is a [place] of  [place] for Metabolic Bone Disease, a World Pharmacologist (WHO) [place]. ASSESSMENT: The probability of a major osteoporotic fracture is 24.9% within the next ten years. The probability of a hip fracture is 6.6% within the next ten years. Electronically Signed   By: Marlaine Hind M.D.   On: 07/01/2020 08:14   IR IMAGING GUIDED PORT INSERTION  Result Date: 07/13/2020 INDICATION: 69 year old female with history of lymphoma requiring central venous access for chemotherapy. EXAM: IMPLANTED PORT A CATH PLACEMENT WITH ULTRASOUND AND FLUOROSCOPIC GUIDANCE COMPARISON:  None. MEDICATIONS: Clindamycin 900 mg IV; The antibiotic was administered within an appropriate time interval prior to skin puncture. ANESTHESIA/SEDATION: Moderate (conscious) sedation was employed during this procedure. A total of Versed 4 mg and Fentanyl 100 mcg was administered intravenously. Moderate Sedation Time: 20 minutes. The patient's level of consciousness and vital signs were monitored continuously by radiology nursing throughout the procedure under my direct supervision. CONTRAST:  None FLUOROSCOPY TIME:  0.1 minutes, (2 mGy) COMPLICATIONS: None immediate. PROCEDURE: The procedure, risks,  benefits, and alternatives were explained to the patient. Questions regarding the procedure were encouraged and answered. The patient understands and consents to the procedure. The right neck and chest were prepped with chlorhexidine in a sterile fashion, and a sterile drape was applied covering the operative field. Maximum barrier sterile technique with sterile gowns and gloves were used for the procedure. A timeout was performed prior to the initiation of the procedure. Ultrasound was used to examine the jugular vein which was compressible and free of internal echoes. A skin marker was used to demarcate the planned venotomy and port pocket incision sites. Local anesthesia was provided to these sites and the subcutaneous tunnel track with 1% lidocaine with 1:100,000 epinephrine. A small incision was created at the jugular access site and blunt dissection was performed of the subcutaneous tissues. Under real time ultrasound guidance, the jugular vein was accessed with a 21 ga micropuncture needle and an 0.018" wire was inserted to the superior vena cava. A 5 Fr micopuncture set was then used, through which a 0.035" Rosen wire was passed under fluoroscopic guidance into the inferior vena cava. An 8 Fr dilator was then placed over the wire. A subcutaneous port pocket was then created along the upper chest wall utilizing a combination of sharp and blunt dissection. The pocket was irrigated with sterile saline, packed with gauze, and observed for hemorrhage. A single lumen "ISP" sized power injectable port was chosen for placement. The 8 Fr catheter was tunneled from the port pocket site to the venotomy incision. The port was placed in the pocket. The external catheter was trimmed to appropriate length. The dilator was exchanged for an 8 Fr peel-away sheath under fluoroscopic guidance. The catheter was then placed through the sheath and the sheath was removed. Final catheter positioning was confirmed and documented with  a fluoroscopic spot radiograph. The port was accessed with a Huber needle, aspirated, and flushed with heparinized saline. The deep dermal layer of the port pocket incision was closed with interrupted 3-0 Vicryl suture. The skin was opposed with a running subcuticular 4-0 Monocryl suture. Dermabond was then placed over the port pocket and neck incisions. The patient tolerated the procedure well without immediate post procedural complication. FINDINGS: After catheter placement, the tip lies within the atrial junction the catheter aspirates and flushes normally and is ready for immediate use. IMPRESSION: Successful placement of a power injectable Port-A-Cath via the right internal jugular vein. The catheter is ready for  immediate use. Ruthann Cancer, MD [facility] Electronically Signed   By: Ruthann Cancer MD   On: 07/13/2020 13:23    Bowdon Krus 69 y.o. female with medical history significant for diffuse non Hodgkin Elliott cell lymphoma who is currently receiving systemic treatment with bendamustine/rituximab.  Last cycle was started on 07/06/2020.  She is now admitted for sepsis secondary to UTI.  Continues to have fevers up to 103 with chills.  Blood cultures negative to date.  #Sepsis secondary to UTI --Work-up to date has been reviewed.  We discussed recommendation to proceed with a CT of the chest/abdomen/pelvis to evaluate for other source of infection.  The patient want to talk to oncology about this before proceeding. --I reviewed the rationale for this test and have recommended for her to proceed with the CT scan. --The patient understands the need for this test and is agreeable to proceed. --I have contacted the [facility] radiology department and notified them of the patient's decision to proceed with a CT scan.  They will send the oral contrast up to the patient shortly and hopefully this test will be performed later  today. --Continue IV antibiotics per hospitalist.  # Non-Hodgkin Elliott Cell Lymphoma, Follicular Lymphoma. Stage III --findings show lymphadenopathy on both sides of the diaphragm. Consistent with at least a Stage III lymphoma -- confirmed the diagnosis with biopsy of the FDG avid mass near the uterus/cervix. Cancel the left axillary lymph node biopsy --Status post 1 cycle of bendamustine/Rituxan. --Additional chemotherapy will be administered as an outpatient.  #Supportive Care --zofran 4 mg p.o. or IV every 6 hours as needed for nausea --acyclovir 400mg  PO BID for VCZ prophylaxis --Oxycodone 5 mg every 4 hours as needed for pain  --No need for transfusion support unless hemoglobin less than 7 or platelet count less than 10,000 or active bleeding.  Mikey Bussing, DNP, AGPCNP-BC, AOCNP Mon/Tues/Thurs/Fri 7am-5pm; Off Wednesdays Cell: [phone]

## 2020-07-20 NOTE — Telephone Encounter (Signed)
Received call from pt's husband, Alyssa Elliott. He states Alyssa Elliott is in the hospital room # [id number]. He states she is admitted due to fevers. She had also developed a rash with the Keflex..  He states she was a little better yesterday but spiked another temp last night of 103.5 He states she is not doing well this morning-very lethargic and just staring off, uncommunicative. Initially she preferred  to have CT scans delayed until today -she wanted to discuss with her oncology team. Carole Binning that it sounds appropriate for a scan to look for a source of her potential infection. Also advised that Dr. Lorenso Courier will not be in the office until Thursday. Advised husband that Mikey Bussing, NP is aware ot pt's admission and that I updated her on the change in  [name]'s condition. Encouraged Alyssa Elliott to go ahead with scans as suggestion by hospital team so the team can move towards figuring out what is going on.  Alyssa Elliott agreeable to doing scans and will discuss with [name]'s nurse this morning. Advised that Mikey Bussing, NP will see patient today as well.Alyssa Elliott voiced appreciation of support.

## 2020-07-20 NOTE — Care Management Important Message (Signed)
Important Message  Patient Details IM Letter given to the Patient Name: Alyssa Elliott MRN: [id number] Date of Birth: 08-13-1951   Medicare Important Message Given:  Yes     Kerin Salen 07/20/2020, 10:04 AM

## 2020-07-20 NOTE — Progress Notes (Addendum)
PROGRESS NOTE  Alyssa Elliott FTD:[id number] DOB: 1951/01/26 DOA: 07/17/2020 PCP: Prince Solian, MD   LOS: 3 days   Brief narrative: As per HPI,  ConnieRichardsonis a33 y.o.female,history of vitamin D deficiency, lymphoma,  port placed 4 days ago), GERD, HLD, Depression, asthma, anxiety presented to hospital with complaints of fever with maximum temperature of 103.5 F for 2 days.  She took some Tylenol with temporary relief but had some chills.  She was empirically treated with cephalexin for UTI as outpatient but noticed erythematous rash rash after taking that medication.  She does have history of penicillin allergy as outpatient.  Patient was then admitted to hospital for leukopenic fever and was started on broad-spectrum antibiotic.   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  Patient was partially treated with Keflex.  She still complains of burning micturition.  Currently on azactam.  Penicillin allergy.  Blood cultures negative so far.  Patient had denied CT scan of the abdomen and pelvis.  Now wishing to undergo CT abdomen and pelvis.  Continue IV antibiotics for now.  Urine culture negative so far.  Blood cultures negative so far.    Non-Hodgkin's B-cell lymphoma, follicular lymphoma stage III, involving the scalp, axillary lymph nodes, hilar, infrahilar nodes, subcarinal node, mesenteric lymph nodes, uterus/cervix. Patient is status post 1 cycle of chemotherapy.  Follow-up oncology as outpatient.  Continue acyclovir.  Oncology team has been added to the treatment team.  Follow oncology recommendations.  Hypokalemia.  Improved with replacement.  Potassium 3.6 today.  Drug induced  hypersensitivity reaction.  Had diffuse erythematous rash with Keflex.  Allergy to penicillin.  Continue azactam.  History of depression. Continue ariprazole and paroxetine.   Hyperlipidemia. Continue with pravastatin.   History of bronchitis. No sings of exacerbation, continue with montelukast and levalbuterol.  Abnormal liver function tests.  Likely related to her chemotherapy, will monitor.   DVT prophylaxis: enoxaparin (LOVENOX) injection 40 mg Start: 07/18/20 1800 SCDs Start: 07/18/20 0002   Code Status: Full code  Family Communication: Spoke with the patient's spouse at bedside.  Status is: Inpatient  Remains inpatient appropriate because:IV treatments appropriate due to intensity of illness or inability to take PO, Inpatient level of care appropriate due to severity of illness and Sepsis with UTI   Dispo: The patient is from: Home              Anticipated d/c is to: Home with home health              Anticipated d/c date is: 2 to 3 days              Patient currently is not medically stable to d/c.   Consultants:  Oncology  Procedures:  None  Antibiotics:  . Azactam  Anti-infectives (From admission, onward)   Start     Dose/Rate Route Frequency Ordered Stop   07/18/20 2300  vancomycin (VANCOREADY) IVPB 1250 mg/250 mL  Status:  Discontinued        1,250 mg 166.7 mL/hr over 90 Minutes Intravenous Every 24 hours 07/17/20 2235 07/18/20 1604   07/18/20 1000  acyclovir (ZOVIRAX) tablet 400 mg        400 mg Oral 2 times daily 07/18/20 0002  07/18/20 0030  aztreonam (AZACTAM) 2 g in sodium chloride 0.9 % 100 mL IVPB        2 g 200 mL/hr over 30 Minutes Intravenous Every 8 hours 07/17/20 2235     07/17/20 2330  ceFEPIme (MAXIPIME) 2 g in sodium chloride 0.9 % 100 mL IVPB  Status:  Discontinued        2 g 200 mL/hr over 30 Minutes Intravenous Every 12 hours 07/17/20 2227 07/17/20 2231   07/17/20 2330  vancomycin (VANCOREADY) IVPB 500 mg/100 mL        500  mg 100 mL/hr over 60 Minutes Intravenous  Once 07/17/20 2227 07/18/20 0322   07/17/20 1730  aztreonam (AZACTAM) 2 g in sodium chloride 0.9 % 100 mL IVPB        2 g 200 mL/hr over 30 Minutes Intravenous  Once 07/17/20 1720 07/17/20 1909   07/17/20 1730  metroNIDAZOLE (FLAGYL) IVPB 500 mg        500 mg 100 mL/hr over 60 Minutes Intravenous  Once 07/17/20 1720 07/17/20 2025   07/17/20 1730  vancomycin (VANCOCIN) IVPB 1000 mg/200 mL premix        1,000 mg 200 mL/hr over 60 Minutes Intravenous  Once 07/17/20 1720 07/17/20 2210      Subjective: Today, patient was seen and examined at bedside.  Patient husband at bedside states that she appears to be very weak and deconditioned.  Patient denies any fever, chills or rigor.  She states that she has improved appetite.  Denies any cough, chest pain, palpitation.  Has no vomiting or abdominal pain.  No sore throat.  Objective: Vitals:   07/20/20 0511 07/20/20 0646  BP: 119/62   Pulse: 95   Resp:    Temp: (!) 103.1 F (39.5 C) 99.6 F (37.6 C)  SpO2: 90%     Intake/Output Summary (Last 24 hours) at 07/20/2020 0812 Last data filed at 07/19/2020 2200 Gross per 24 hour  Intake 675 ml  Output --  Net 675 ml   Filed Weights   07/17/20 1646  Weight: 68 kg   Body mass index is 25.75 kg/m.   Physical Exam: GENERAL: Patient is alert awake and communicative not in obvious distress.  Slow to respond, appears weak. HENT: No scleral pallor or icterus. Pupils equally reactive to light. Oral mucosa is moist.  Throat without any congestion. NECK: is supple, no gross swelling noted. CHEST:  Diminished breath sounds bilaterally.  Right chest wall port catheter site appears healthy. CVS: S1 and S2 heard, no murmur. Regular rate and rhythm.  ABDOMEN: Soft, non-tender, bowel sounds are present. EXTREMITIES: No edema. CNS: Cranial nerves are intact. No focal motor deficits. SKIN: warm and dry. Erythematous rash has improved.  Data Review: I have  personally reviewed the following laboratory data and studies,  CBC: Recent Labs  Lab 07/16/20 1449 07/17/20 1720 07/18/20 0410 07/19/20 0426 07/20/20 0336  WBC 4.4 4.4 3.9* 5.6 4.6  NEUTROABS 3.7 3.8 3.6 4.8  --   HGB 13.5 13.4 12.4 11.2* 10.6*  HCT 40.3 40.3 37.9 34.2* 32.7*  MCV 92.2 93.5 95.2 95.0 95.6  PLT 144* 119* 104* 104* 557*   Basic Metabolic Panel: Recent Labs  Lab 07/16/20 1449 07/17/20 1720 07/18/20 0410 07/19/20 0426 07/20/20 0336  NA 136 135 141 140 138  K 3.6 3.3* 4.0 4.0 3.6  CL 104 102 107 107 106  CO2 26 24 24 26 24   GLUCOSE 101* 114* 162* 134* 92  BUN 8 12  9 13 14   CREATININE 0.84 0.90 0.76 0.73 0.75  CALCIUM 8.9 8.2* 8.4* 8.1* 7.7*  MG  --   --  2.0  --   --    Liver Function Tests: Recent Labs  Lab 07/16/20 1449 07/17/20 1720 07/18/20 0410 07/20/20 0336  AST 38 85* 97* 54*  ALT 28 65* 83* 71*  ALKPHOS 98 90 93 97  BILITOT 0.6 0.6 0.8 0.5  PROT 6.3* 6.4* 5.7* 5.0*  ALBUMIN 3.2* 3.3* 2.8* 2.5*   No results for input(s): LIPASE, AMYLASE in the last 168 hours. No results for input(s): AMMONIA in the last 168 hours. Cardiac Enzymes: No results for input(s): CKTOTAL, CKMB, CKMBINDEX, TROPONINI in the last 168 hours. BNP (last 3 results) No results for input(s): BNP in the last 8760 hours.  ProBNP (last 3 results) No results for input(s): PROBNP in the last 8760 hours.  CBG: No results for input(s): GLUCAP in the last 168 hours. Recent Results (from the past 240 hour(s))  Urine Culture     Status: Abnormal   Collection Time: 07/16/20  3:12 PM   Specimen: Urine, Clean Catch  Result Value Ref Range Status   Specimen Description   Final    URINE, CLEAN CATCH Performed at [facility], [address] [place], [place] [address]    Special Requests   Final    NONE Performed at [facility], [place] [address], [place], [place] [address]    Culture (A)  Final    <10,000 COLONIES/mL  INSIGNIFICANT GROWTH Performed at [place] [address] [place], [place] [address]    Report Status 07/17/2020 FINAL  Final  Culture, blood (Routine x 2)     Status: None (Preliminary result)   Collection Time: 07/17/20  5:20 PM   Specimen: Site Not Specified; Blood  Result Value Ref Range Status   Specimen Description   Final    SITE NOT SPECIFIED Performed at [place] [address] [place], [place] [address]    Special Requests   Final    BOTTLES DRAWN AEROBIC AND ANAEROBIC Blood Culture results may not be optimal due to an inadequate volume of blood received in culture bottles Performed at [place] [address] [place], [place] [address]    Culture   Final    NO GROWTH 2 DAYS Performed at [place] [address], [place], [place] [address]    Report Status PENDING  Incomplete  Culture, blood (Routine x 2)     Status: None (Preliminary result)   Collection Time: 07/17/20  5:21 PM   Specimen: Site Not Specified; Blood  Result Value Ref Range Status   Specimen Description   Final    SITE NOT SPECIFIED Performed at [place] [address], [place], [place] [address]    Special Requests   Final    BOTTLES DRAWN AEROBIC AND ANAEROBIC Blood Culture adequate volume Performed at [place] [address] [place], [place] [address]    Culture   Final    NO GROWTH 2 DAYS Performed at [facility] [address] [place], [place] [address]    Report Status PENDING  Incomplete  Respiratory Panel by RT PCR (Flu A&B, Covid) - Nasopharyngeal Swab     Status: None   Collection Time: 07/17/20  5:21 PM   Specimen: Nasopharyngeal Swab  Result Value Ref Range Status  SARS Coronavirus 2 by RT PCR NEGATIVE NEGATIVE Final    Comment: (NOTE) SARS-CoV-2 target nucleic acids are NOT DETECTED.  The SARS-CoV-2 RNA is generally detectable in upper  respiratoy specimens during the acute phase of infection. The lowest concentration of SARS-CoV-2 viral copies this assay can detect is 131 copies/mL. A negative result does not preclude SARS-Cov-2 infection and should not be used as the sole basis for treatment or other patient management decisions. A negative result may occur with  improper specimen collection/handling, submission of specimen other than nasopharyngeal swab, presence of viral mutation(s) within the areas targeted by this assay, and inadequate number of viral copies (<131 copies/mL). A negative result must be combined with clinical observations, patient history, and epidemiological information. The expected result is Negative.  Fact Sheet for Patients:  PinkCheek.be  Fact Sheet for Healthcare Providers:  GravelBags.it  This test is no t yet approved or cleared by the [place] FDA and  has been authorized for detection and/or diagnosis of SARS-CoV-2 by FDA under an Emergency Use Authorization (EUA). This EUA will remain  in effect (meaning this test can be used) for the duration of the COVID-19 declaration under Section 564(b)(1) of the Act, 21 U.S.C. section 360bbb-3(b)(1), unless the authorization is terminated or revoked sooner.     Influenza A by PCR NEGATIVE NEGATIVE Final   Influenza B by PCR NEGATIVE NEGATIVE Final    Comment: (NOTE) The Xpert Xpress SARS-CoV-2/FLU/RSV assay is intended as an aid in  the diagnosis of influenza from Nasopharyngeal swab specimens and  should not be used as a sole basis for treatment. Nasal washings and  aspirates are unacceptable for Xpert Xpress SARS-CoV-2/FLU/RSV  testing.  Fact Sheet for Patients: PinkCheek.be  Fact Sheet for Healthcare Providers: GravelBags.it  This test is not yet approved or cleared by the [place] FDA and  has been  authorized for detection and/or diagnosis of SARS-CoV-2 by  FDA under an Emergency Use Authorization (EUA). This EUA will remain  in effect (meaning this test can be used) for the duration of the  Covid-19 declaration under Section 564(b)(1) of the Act, 21  U.S.C. section 360bbb-3(b)(1), unless the authorization is  terminated or revoked. Performed at [facility], [place] [address], [place], [place] [address]   Urine culture     Status: None   Collection Time: 07/17/20  6:16 PM   Specimen: In/Out Cath Urine  Result Value Ref Range Status   Specimen Description   Final    IN/OUT CATH URINE Performed at [place] [address], [place], [place] [address]    Special Requests   Final    NONE Performed at [facility], [place] [address], [place], [place] [address]    Culture   Final    NO GROWTH Performed at [facility], [place] [address], [place], [place] [address]    Report Status 07/19/2020 FINAL  Final     Studies: No results found.    Flora Lipps, MD  [facility] 07/20/2020

## 2020-07-20 NOTE — Telephone Encounter (Signed)
 Historical medication: Voltaren gel 1 %     Last visit 04/21/2020 MD Arnaldo   Next appointment 08/21/2020 NP Ivey   Previous refill encounter(s)   10/15/2019 Norvasc #30,  12/06/2019 Glucophage -XR #60.     Requested Prescriptions     Pending Prescriptions Disp Refills   . metFORMIN  ER (GLUCOPHAGE  XR) 500 mg tablet 60 Tablet 0     Sig: TAKE 2 TABLETS BY MOUTH DAILY WITH DINNER   . amLODIPine (NORVASC) 10 mg tablet 30 Tablet 0     Sig: Take 1 Tablet by mouth daily.   . diclofenac (VOLTAREN) 1 % gel

## 2020-07-20 NOTE — Telephone Encounter (Signed)
-----   Message from Armanda Heritage sent at 07/20/2020  2:26 PM EDT -----  Subject: Refill Request    QUESTIONS  Name of Medication? metFORMIN ER (GLUCOPHAGE XR) 500 mg tablet  Patient-reported dosage and instructions? 1x daily  How many days do you have left? 0  Preferred Pharmacy? [facility] [phone]  Pharmacy phone number (if available)? [phone]  ---------------------------------------------------------------------------  --------------,  Name of Medication? diclofenac (VOLTAREN) 1 % gel  Patient-reported dosage and instructions? 1x daily   How many days do you have left? 0  Preferred Pharmacy? [facility] [phone]  Pharmacy phone number (if available)? [phone]  ---------------------------------------------------------------------------  --------------,  Name of Medication? amLODIPine (NORVASC) 10 mg tablet  Patient-reported dosage and instructions? 1x daily  How many days do you have left? 0  Preferred Pharmacy? [facility] [phone]  Pharmacy phone number (if available)? [phone]  ---------------------------------------------------------------------------  --------------  Cleotis Lema INFO  What is the best way for the office to contact you? OK to leave message on   voicemail  Preferred Call Back Phone Number? [phone]

## 2020-07-21 LAB — BASIC METABOLIC PANEL
Anion gap: 7 (ref 5–15)
BUN: 8 mg/dL (ref 8–23)
CO2: 27 mmol/L (ref 22–32)
Calcium: 8 mg/dL — ABNORMAL LOW (ref 8.9–10.3)
Chloride: 104 mmol/L (ref 98–111)
Creatinine, Ser: 0.62 mg/dL (ref 0.44–1.00)
GFR, Estimated: >60 mL/min (ref >60)
Glucose, Bld: 88 mg/dL (ref 70–99)
Potassium: 3.2 mmol/L — ABNORMAL LOW (ref 3.5–5.1)
Sodium: 138 mmol/L (ref 135–145)

## 2020-07-21 LAB — MAGNESIUM: Magnesium: 2.1 mg/dL (ref 1.7–2.4)

## 2020-07-21 LAB — PHOSPHORUS: Phosphorus: 3.9 mg/dL (ref 2.5–4.6)

## 2020-07-21 MED ORDER — METFORMIN SR 500 MG 24 HR TABLET
500 mg | ORAL_TABLET | ORAL | 0 refills | Status: DC
Start: 2020-07-21 — End: 2020-10-30

## 2020-07-21 MED ORDER — AMLODIPINE 10 MG TAB
10 mg | ORAL_TABLET | Freq: Every day | ORAL | 0 refills | Status: DC
Start: 2020-07-21 — End: 2021-01-20

## 2020-07-21 MED ORDER — DICLOFENAC 1 % TOPICAL GEL
1 % | Freq: Four times a day (QID) | CUTANEOUS | 1 refills | Status: DC | PRN
Start: 2020-07-21 — End: 2020-11-19

## 2020-07-21 NOTE — Progress Notes (Signed)
Pharmacy Antibiotic Note  Alyssa Elliott is a 69 y.o. female admitted on 07/17/2020 with sepsis of unknown source.  Pharmacy has been consulted aztreonam dosing.  Plan: Continue aztreonam 2 grams IV q 8 hours  F/U renal function, culture results, clinical course  Height: 5\' 4"  (162.6 cm) Weight: 68 kg (150 lb) IBW/kg (Calculated) : 54.7  Temp (24hrs), Avg:98.9 F (37.2 C), Min:98.3 F (36.8 C), Max:99.5 F (37.5 C)  Recent Labs  Lab 07/17/20 1720 07/17/20 2345 07/18/20 0410 07/19/20 0426 07/20/20 0336 07/20/20 1239 07/21/20 0422  WBC 4.4  --  3.9* 5.6 4.6 3.9*  --   CREATININE 0.90  --  0.76 0.73 0.75  --  0.62  LATICACIDVEN 1.1 1.7  --   --   --   --   --     Estimated Creatinine Clearance: 61.1 mL/min (by C-G formula based on SCr of 0.62 mg/dL).    Allergies  Allergen Reactions  . Codeine Nausea And Vomiting  . Penicillins Swelling and Other (See Comments)    Swelling  Of joints  Did it involve swelling of the face/tongue/throat, SOB, or low BP? No Did it involve sudden or severe rash/hives, skin peeling, or any reaction on the inside of your mouth or nose? No Did you need to seek medical attention at a hospital or doctor's office? No When did it last happen? Childhood allergy **ALLERGIC TO CEPHALOSPORINS**   . Keflex [Cephalexin] Rash  . Latex Rash and Other (See Comments)    redness  . Sulfa Antibiotics Rash    Thank you for allowing pharmacy to be a part of this patient's care.  Ulice Dash D 07/21/2020 1:36 PM

## 2020-07-21 NOTE — Plan of Care (Signed)
  Problem: Fluid Volume: Goal: Hemodynamic stability will improve Outcome: Progressing   Problem: Clinical Measurements: Goal: Diagnostic test results will improve Outcome: Progressing Goal: Signs and symptoms of infection will decrease Outcome: Progressing   Problem: Respiratory: Goal: Ability to maintain adequate ventilation will improve Outcome: Progressing   Problem: Activity: Goal: Risk for activity intolerance will decrease Outcome: Progressing   Problem: Nutrition: Goal: Adequate nutrition will be maintained Outcome: Progressing

## 2020-07-21 NOTE — Progress Notes (Signed)
PROGRESS NOTE  Alyssa Elliott TIR:[id number] DOB: Sep 28, 1950 DOA: 07/17/2020 PCP: Prince Solian, MD   LOS: 4 days   Brief narrative: As per HPI,  ConnieRichardsonis a59 y.o.female,history of vitamin D deficiency, lymphoma,  port placed 4 days ago), GERD, Hyperlipidemia, Depression, asthma, anxiety presented to hospital with complaints of fever with maximum temperature of 103.5 F for 2 days.  She took some Tylenol with temporary relief but had some chills.  She was empirically treated with cephalexin for UTI as outpatient but noticed erythematous rash rash after taking that medication.  She does have history of penicillin allergy as outpatient.  Patient was then admitted to hospital for leukopenic fever and was started on IV broad-spectrum antibiotic.   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  Patient was partially treated with Keflex.  She still complains of burning micturition.  Currently on IV azactam.  Penicillin allergy.    CT scan of the abdomen pelvis showed minimal sigmoid diverticulosis, infiltrative changes in the small bowel mesentery consistent with fibrosing mesenteritis progressive since prior exam.  Not much of lymph nodes were described including mass around the vaginal area from the previous scan.  Bronchitic changes in the lower lobes.  Patient denies any abdominal pain. Continue IV antibiotics for now.  Urine culture negative so far.  Blood cultures negative so far.  COVID-19 was negative including influenza.  T-max of 100.4 F on 07/20/2020.  Follow temperature curve  Non-Hodgkin's B-cell lymphoma, follicular lymphoma stage III, involving the scalp, axillary lymph nodes, hilar,  infrahilar nodes, subcarinal node, mesenteric lymph nodes, uterus/cervix. Patient is status post 1 cycle of chemotherapy.   Continue acyclovir.  Oncology team on board and follow recommendations.  Hypokalemia.  We will continue to replenish.  Potassium 3.2 today.  Magnesium 2.1  Drug induced hypersensitivity reaction.  Had diffuse erythematous rash with Keflex.  Allergy to penicillin.  Continue azactam for now..  History of depression. Continue ariprazole and paroxetine.   Hyperlipidemia. Continue with pravastatin.   History of bronchitis. No sings of exacerbation, continue with montelukast and levalbuterol.  Abnormal liver function tests.  Likely related to her chemotherapy, will monitor.  DVT prophylaxis: enoxaparin (LOVENOX) injection 40 mg Start: 07/18/20 1800 SCDs Start: 07/18/20 0002   Code Status: Full code  Family Communication: I again spoke with the patient's spouse on the phone and updated him about the clinical condition of the patient.  Status is: Inpatient  Remains inpatient appropriate because:IV treatments appropriate due to intensity of illness or inability to take PO, Inpatient level of care appropriate due to severity of illness and Sepsis with UTI   Dispo: The patient is from: Home              Anticipated d/c is to: Home with home health              Anticipated d/c date is: Likely in 1 to 2 days.  Follow temperature spike.  Continue current antibiotic for now.              Patient currently is not medically stable to d/c.   Consultants:  Oncology  Procedures:  None  Antibiotics:  . Azactam  Anti-infectives (From admission, onward)   Start  Dose/Rate Route Frequency Ordered Stop   07/18/20 2300  vancomycin (VANCOREADY) IVPB 1250 mg/250 mL  Status:  Discontinued        1,250 mg 166.7 mL/hr over 90 Minutes Intravenous Every 24 hours 07/17/20 2235 07/18/20 1604   07/18/20 1000  acyclovir (ZOVIRAX) tablet 400 mg        400 mg Oral 2  times daily 07/18/20 0002     07/18/20 0030  aztreonam (AZACTAM) 2 g in sodium chloride 0.9 % 100 mL IVPB        2 g 200 mL/hr over 30 Minutes Intravenous Every 8 hours 07/17/20 2235     07/17/20 2330  ceFEPIme (MAXIPIME) 2 g in sodium chloride 0.9 % 100 mL IVPB  Status:  Discontinued        2 g 200 mL/hr over 30 Minutes Intravenous Every 12 hours 07/17/20 2227 07/17/20 2231   07/17/20 2330  vancomycin (VANCOREADY) IVPB 500 mg/100 mL        500 mg 100 mL/hr over 60 Minutes Intravenous  Once 07/17/20 2227 07/18/20 0322   07/17/20 1730  aztreonam (AZACTAM) 2 g in sodium chloride 0.9 % 100 mL IVPB        2 g 200 mL/hr over 30 Minutes Intravenous  Once 07/17/20 1720 07/17/20 1909   07/17/20 1730  metroNIDAZOLE (FLAGYL) IVPB 500 mg        500 mg 100 mL/hr over 60 Minutes Intravenous  Once 07/17/20 1720 07/17/20 2025   07/17/20 1730  vancomycin (VANCOCIN) IVPB 1000 mg/200 mL premix        1,000 mg 200 mL/hr over 60 Minutes Intravenous  Once 07/17/20 1720 07/17/20 2210      Subjective: Today, patient was seen and examined at bedside.  Patient feels better today.  Denies any shortness of breath cough fever chills or rigor.  She feels more energetic.  She does have a residual mild burning urinary sensation but no abdominal pain, nausea vomiting.  Objective: Vitals:   07/21/20 0220 07/21/20 0536  BP: (!) 112/58 130/71  Pulse: 82 87  Resp: 18 18  Temp: 98.3 F (36.8 C) 98.8 F (37.1 C)  SpO2: 94% 90%    Intake/Output Summary (Last 24 hours) at 07/21/2020 0758 Last data filed at 07/21/2020 0200 Gross per 24 hour  Intake 100 ml  Output --  Net 100 ml   Filed Weights   07/17/20 1646  Weight: 68 kg   Body mass index is 25.75 kg/m.   Physical Exam:  GENERAL: Patient is alert awake and communicative not in obvious distress.  Feels more energized today more communicative  HENT: No scleral pallor or icterus. Pupils equally reactive to light. Oral mucosa is moist.  Throat without any  congestion. NECK: is supple, no gross swelling noted. CHEST:  Diminished breath sounds bilaterally.  Right chest wall port catheter site appears healthy. CVS: S1 and S2 heard, no murmur. Regular rate and rhythm.  ABDOMEN: Soft, non-tender, bowel sounds are present. EXTREMITIES: No edema. CNS: Cranial nerves are intact. No focal motor deficits. SKIN: warm and dry. Erythematous rash has improved.  Data Review: I have personally reviewed the following laboratory data and studies,  CBC: Recent Labs  Lab 07/16/20 1449 07/16/20 1449 07/17/20 1720 07/18/20 0410 07/19/20 0426 07/20/20 0336 07/20/20 1239  WBC 4.4  --  4.4 3.9* 5.6 4.6 3.9*  NEUTROABS 3.7  --  3.8 3.6 4.8  --  3.1  HGB 13.5  --  13.4 12.4 11.2* 10.6* 10.2*  HCT 40.3   < > 40.3 37.9 34.2* 32.7* 31.4*  MCV 92.2   < > 93.5 95.2 95.0 95.6 96.0  PLT 144*  --  119* 104* 104* 107* 100*   < > = values in this interval not displayed.   Basic Metabolic Panel: Recent Labs  Lab 07/17/20 1720 07/18/20 0410 07/19/20 0426 07/20/20 0336 07/21/20 0422  NA 135 141 140 138 138  K 3.3* 4.0 4.0 3.6 3.2*  CL 102 107 107 106 104  CO2 24 24 26 24 27   GLUCOSE 114* 162* 134* 92 88  BUN 12 9 13 14 8   CREATININE 0.90 0.76 0.73 0.75 0.62  CALCIUM 8.2* 8.4* 8.1* 7.7* 8.0*  MG  --  2.0  --   --  2.1  PHOS  --   --   --   --  3.9   Liver Function Tests: Recent Labs  Lab 07/16/20 1449 07/17/20 1720 07/18/20 0410 07/20/20 0336  AST 38 85* 97* 54*  ALT 28 65* 83* 71*  ALKPHOS 98 90 93 97  BILITOT 0.6 0.6 0.8 0.5  PROT 6.3* 6.4* 5.7* 5.0*  ALBUMIN 3.2* 3.3* 2.8* 2.5*   No results for input(s): LIPASE, AMYLASE in the last 168 hours. No results for input(s): AMMONIA in the last 168 hours. Cardiac Enzymes: No results for input(s): CKTOTAL, CKMB, CKMBINDEX, TROPONINI in the last 168 hours. BNP (last 3 results) No results for input(s): BNP in the last 8760 hours.  ProBNP (last 3 results) No results for input(s): PROBNP in the last  8760 hours.  CBG: No results for input(s): GLUCAP in the last 168 hours. Recent Results (from the past 240 hour(s))  Urine Culture     Status: Abnormal   Collection Time: 07/16/20  3:12 PM   Specimen: Urine, Clean Catch  Result Value Ref Range Status   Specimen Description   Final    URINE, CLEAN CATCH Performed at [facility], [address], [place], [place] [address]    Special Requests   Final    NONE Performed at [facility], [place] [address], [place], [place] [address]    Culture (A)  Final    <10,000 COLONIES/mL INSIGNIFICANT GROWTH Performed at [facility] [address] [place], [place] [address]    Report Status 07/17/2020 FINAL  Final  Culture, blood (Routine x 2)     Status: None (Preliminary result)   Collection Time: 07/17/20  5:20 PM   Specimen: Site Not Specified; Blood  Result Value Ref Range Status   Specimen Description   Final    SITE NOT SPECIFIED Performed at [facility] [address] [place], [place] [address]    Special Requests   Final    BOTTLES DRAWN AEROBIC AND ANAEROBIC Blood Culture results may not be optimal due to an inadequate volume of blood received in culture bottles Performed at [facility] [address], [place], [place] [address]    Culture   Final    NO GROWTH 4 DAYS Performed at [facility], [place] [address], [place],  [address]    Report Status PENDING  Incomplete  Culture, blood (Routine x 2)     Status: None (Preliminary result)   Collection Time: 07/17/20  5:21 PM   Specimen: Site Not Specified; Blood  Result Value Ref Range Status   Specimen Description   Final    SITE NOT SPECIFIED Performed at [facility]  [place] [address], [place], [place] [address]    Special Requests   Final    BOTTLES DRAWN AEROBIC AND ANAEROBIC Blood Culture adequate volume Performed at [facility] [address] [place], [place] [address]    Culture   Final    NO GROWTH 4 DAYS Performed at [facility], [place] [address] [place], [place] [address]    Report Status PENDING  Incomplete  Respiratory Panel by RT PCR (Flu A&B, Covid) - Nasopharyngeal Swab     Status: None   Collection Time: 07/17/20  5:21 PM   Specimen: Nasopharyngeal Swab  Result Value Ref Range Status   SARS Coronavirus 2 by RT PCR NEGATIVE NEGATIVE Final    Comment: (NOTE) SARS-CoV-2 target nucleic acids are NOT DETECTED.  The SARS-CoV-2 RNA is generally detectable in upper respiratoy specimens during the acute phase of infection. The lowest concentration of SARS-CoV-2 viral copies this assay can detect is 131 copies/mL. A negative result does not preclude SARS-Cov-2 infection and should not be used as the sole basis for treatment or other patient management decisions. A negative result may occur with  improper specimen collection/handling, submission of specimen other than nasopharyngeal swab, presence of viral mutation(s) within the areas targeted by this assay, and inadequate number of viral copies (<131 copies/mL). A negative result must be combined with clinical observations, patient history, and epidemiological information. The expected result is Negative.  Fact Sheet for Patients:  PinkCheek.be  Fact Sheet for Healthcare Providers:  GravelBags.it  This test is no t yet approved or cleared by the [place] FDA and  has been authorized for detection and/or diagnosis of SARS-CoV-2 by FDA under an Emergency Use Authorization (EUA). This EUA will remain  in effect (meaning this test can be used) for the duration of the COVID-19 declaration under Section 564(b)(1) of the Act, 21 U.S.C. section 360bbb-3(b)(1), unless the authorization is terminated or revoked sooner.     Influenza A by PCR NEGATIVE NEGATIVE Final   Influenza B by PCR  NEGATIVE NEGATIVE Final    Comment: (NOTE) The Xpert Xpress SARS-CoV-2/FLU/RSV assay is intended as an aid in  the diagnosis of influenza from Nasopharyngeal swab specimens and  should not be used as a sole basis for treatment. Nasal washings and  aspirates are unacceptable for Xpert Xpress SARS-CoV-2/FLU/RSV  testing.  Fact Sheet for Patients: PinkCheek.be  Fact Sheet for Healthcare Providers: GravelBags.it  This test is not yet approved or cleared by the [place] FDA and  has been authorized for detection and/or diagnosis of SARS-CoV-2 by  FDA under an Emergency Use Authorization (EUA). This EUA will remain  in effect (meaning this test can be used) for the duration of the  Covid-19 declaration under Section 564(b)(1) of the Act, 21  U.S.C. section 360bbb-3(b)(1), unless the authorization is  terminated or revoked. Performed at [facility], [place] [address], [place], [place] [address]   Urine culture     Status: None   Collection Time: 07/17/20  6:16 PM   Specimen: In/Out Cath Urine  Result Value Ref Range Status   Specimen Description   Final    IN/OUT CATH URINE Performed at [place] [address] [place], [place] [address]    Special Requests   Final    NONE Performed at [facility], [place] [address], [place],  [address]    Culture   Final    NO GROWTH  Performed at [facility], [place] [address] [place], [place] [address]    Report Status 07/19/2020 FINAL  Final     Studies: CT CHEST ABDOMEN PELVIS W CONTRAST  Result Date: 07/20/2020 CLINICAL DATA:  Fever of unknown origin, history of diffuse non-Hodgkin B-cell lymphoma, sepsis, UTI EXAM: CT CHEST, ABDOMEN, AND PELVIS WITH CONTRAST TECHNIQUE: Multidetector CT imaging of the chest, abdomen and pelvis was performed following the standard protocol during bolus administration of  intravenous contrast. Sagittal and coronal MPR images reconstructed from axial data set. CONTRAST:  157mL OMNIPAQUE IOHEXOL 300 MG/ML SOLN IV. No oral contrast. COMPARISON:  06/25/2020 FINDINGS: CT CHEST FINDINGS Cardiovascular: RIGHT jugular Port-A-Cath, tip in SVC near cavoatrial junction. Thoracic vascular structures grossly patent on non targeted exam. Heart normal size. Aorta normal caliber. No pericardial effusion. Mediastinum/Nodes: Few normal size mediastinal lymph nodes. No thoracic adenopathy. Esophagus unremarkable. Base of cervical region normal appearance. Lungs/Pleura: Minimal dependent atelectasis slightly greater on LEFT. Linear subsegmental atelectasis in lingula. Central peribronchial thickening. No pulmonary infiltrate, pleural effusion or pneumothorax. Musculoskeletal: Osseous demineralization. Sclerotic focus T4 vertebral body unchanged since 2014. No acute osseous findings. CT ABDOMEN PELVIS FINDINGS Hepatobiliary: Gallbladder and liver normal appearance Pancreas: Normal appearance Spleen: Normal appearance Adrenals/Urinary Tract: Adrenal glands, kidneys, ureters, and bladder normal appearance Stomach/Bowel: Minimal sigmoid diverticulosis without evidence of diverticulitis. Stomach and bowel loops otherwise normal appearance. Normal appendix. Vascular/Lymphatic: Vascular structures patent. Aorta normal caliber. Few scattered normal size mesenteric and retroperitoneal lymph nodes without abdominal or pelvic adenopathy. Reproductive: Uterus and ovaries unremarkable Other: Infiltrative changes throughout small-bowel mesentery consistent with fibrosing mesenteritis/mesenteric panniculitis, progressive since prior exam. No free air or free fluid. No hernia. Musculoskeletal: Mild degenerative disc disease changes at lower lumbar spine. Osseous demineralization. IMPRESSION: Minimal sigmoid diverticulosis without evidence of diverticulitis. Infiltrative changes of small-bowel mesentery centrally  consistent with fibrosing mesenteritis/mesenteric panniculitis, progressive since prior exam. Bronchitic changes with minimal basilar atelectasis. No acute intrathoracic, intra-abdominal or intrapelvic abnormalities otherwise identified. Electronically Signed   By: Lavonia Dana M.D.   On: 07/20/2020 16:25      Flora Lipps, MD  [facility] 07/21/2020

## 2020-07-22 ENCOUNTER — Telehealth: Payer: Self-pay | Admitting: *Deleted

## 2020-07-22 DIAGNOSIS — J3089 Other allergic rhinitis: Secondary | ICD-10-CM

## 2020-07-22 DIAGNOSIS — J302 Other seasonal allergic rhinitis: Secondary | ICD-10-CM

## 2020-07-22 LAB — CULTURE, BLOOD (ROUTINE X 2)
Culture: NO GROWTH
Culture: NO GROWTH
Special Requests: ADEQUATE

## 2020-07-22 LAB — BASIC METABOLIC PANEL
Anion gap: 8 (ref 5–15)
BUN: 6 mg/dL — ABNORMAL LOW (ref 8–23)
CO2: 25 mmol/L (ref 22–32)
Calcium: 8 mg/dL — ABNORMAL LOW (ref 8.9–10.3)
Chloride: 107 mmol/L (ref 98–111)
Creatinine, Ser: 0.62 mg/dL (ref 0.44–1.00)
GFR, Estimated: >60 mL/min (ref >60)
Glucose, Bld: 133 mg/dL — ABNORMAL HIGH (ref 70–99)
Potassium: 3.1 mmol/L — ABNORMAL LOW (ref 3.5–5.1)
Sodium: 140 mmol/L (ref 135–145)

## 2020-07-22 MED ORDER — CIPROFLOXACIN HCL 500 MG PO TABS: 500.0000 mg | ORAL_TABLET | Freq: Two times a day (BID) | ORAL | 0 refills | Status: DC

## 2020-07-22 MED ORDER — HEPARIN SOD (PORK) LOCK FLUSH 100 UNIT/ML IV SOLN
500.0000 [IU] | INTRAVENOUS | Status: AC | PRN
  Administered 2020-07-22: 500 [IU]
  Filled 2020-07-22: qty 5

## 2020-07-22 MED ORDER — POTASSIUM CHLORIDE 10 MEQ/100ML IV SOLN
10.0000 meq | INTRAVENOUS | Status: AC
  Administered 2020-07-22 (×4): 10 meq via INTRAVENOUS
  Filled 2020-07-22 (×4): qty 100

## 2020-07-22 MED ORDER — POTASSIUM CHLORIDE ER 10 MEQ PO TBCR: 20.0000 meq | EXTENDED_RELEASE_TABLET | Freq: Every day | ORAL | 0 refills | Status: DC

## 2020-07-22 NOTE — Discharge Summary (Signed)
Physician Discharge Summary  Alyssa Elliott XBW:[id number] DOB: 1951/01/28 DOA: 07/17/2020  PCP: Prince Solian, MD  Admit date: 07/17/2020 Discharge date: 07/22/2020  Admitted From: Home  Discharge disposition: Home   Recommendations for Outpatient Follow-Up:   . Follow up with your primary care provider in one week.  . Follow-up with oncology as has been scheduled. . Check CBC, BMP, magnesium in the next visit. . Patient has been started on potassium supplements. please monitor potassium levels on the next visit.   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  Diet recommendation:  Regular.  Wound care: None.  Code status: Full.   History of Present Illness:  ConnieRichardsonis a69 y.o.female,history of vitamin D deficiency, lymphoma,  port placed 4 days ago), GERD, Hyperlipidemia, Depression, asthma, anxiety presented to hospital with complaints of fever with maximum temperature of 103.5 F for 2 days.  She took some Tylenol with temporary relief but had some chills.  She was empirically treated with cephalexin for UTI as outpatient but noticed erythematous rash rash after taking that medication.  She does have history of penicillin allergy as outpatient.  Patient was then admitted to hospital for leukopenic fever and was started on IV broad-spectrum antibiotic.   Hospital Course:   Following conditions were addressed during hospitalization as listed below,  Sepsis secondary to urinary tract infection.  Patient was partially treated with Keflex.  She complained of burning micturition.    Due to penicillin and Keflex allergy, she was started on azactam during hospitalization.    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  Not much of lymph nodes were described including mass around the vaginal area from the previous scan.    Patient did not have any abdominal tenderness or abdominal symptoms at all.     Urine culture negative so far.  Blood cultures negative so far.  COVID-19 was negative including influenza.   Patient was initially febrile but has been afebrile for more than 24 hours.  At this time we will transition to p.o. Cipro for the next 3 days to complete a 7-day course of total antibiotic on discharge.  Non-Hodgkin's B-cell lymphoma, follicular lymphoma stage III, involving the scalp, axillary lymph nodes, hilar, infrahilar nodes, subcarinal node, mesenteric lymph nodes, uterus/cervix. Patient is status post 1 cycle of chemotherapy.   Continue acyclovir.  Oncology team to follow after discharge.  Patient does have an appointment soon.  CT scan of the abdomen showing improvement in lymph nodes.  Hypokalemia. Received IV potassium 40 mill milliequivalents prior to discharge plus p.o. 40 mEq today..  Patient will be given prescription for 20 mEq of potassium every day.  She has an appointment lined up with oncology in 2 to 3 days and I have advised her to discuss about blood work at that time.  Drug induced hypersensitivity reaction.  Had diffuse erythematous rash with Keflex.  Allergy to penicillin.  Keflex has been added as allergy as well.  Cipro has been prescribed on discharge to complete the course for UTI.Marland Kitchen  History of depression. Continue ariprazole and paroxetine.   Hyperlipidemia. Continue with pravastatin.   History of bronchitis.  No sings of exacerbation, continue with montelukast and levalbuterol.  Abnormal liver function tests.Likely related to her chemotherapy.  Mild.  Will need outpatient  monitoring.  Disposition.  At this time, patient is stable for disposition home.  Patient will follow up with your primary care physician and oncology as outpatient.  Medical Consultants:    Oncology  Procedures:    None Subjective:   Today, patient was seen and examined at bedside.  Denies any fever, chills or rigor.  Denies any nausea, vomiting abdominal pain shortness of breath or cough.  Feels more energetic and wishes to go home  Discharge Exam:   Vitals:   07/21/20 2208 07/22/20 0550  BP: (!) 111/58 110/60  Pulse: 75 75  Resp:  14  Temp: 98.8 F (37.1 C) 98.6 F (37 C)  SpO2: 94% 94%   Vitals:   07/21/20 0536 07/21/20 1407 07/21/20 2208 07/22/20 0550  BP: 130/71 127/68 (!) 111/58 110/60  Pulse: 87 87 75 75  Resp: 18 12  14   Temp: 98.8 F (37.1 C)  98.8 F (37.1 C) 98.6 F (37 C)  TempSrc: Oral  Oral Oral  SpO2: 90% 92% 94% 94%  Weight:      Height:       General: Alert awake, not in obvious distress, feels more energetic. HENT: pupils equally reacting to light,  No scleral pallor or icterus noted. Oral mucosa is moist.  Chest:  Clear breath sounds.  Diminished breath sounds bilaterally. No crackles or wheezes.  Right chest wall Port-A-Cath in place. CVS: S1 &S2 heard. No murmur.  Regular rate and rhythm. Abdomen: Soft, nontender, nondistended.  Bowel sounds are heard.   Extremities: No cyanosis, clubbing or edema.  Peripheral pulses are palpable. Psych: Alert, awake and oriented, normal mood CNS:  No cranial nerve deficits.  Power equal in all extremities.   Skin: Warm and dry.    The results of significant diagnostics from this hospitalization (including imaging, microbiology, ancillary and laboratory) are listed below for reference.     Diagnostic Studies:   DG Chest 2 View  Result Date: 07/17/2020 CLINICAL DATA:  69 year old female with suspected sepsis. EXAM: CHEST - 2 VIEW COMPARISON:  Chest radiograph dated 09/02/2019. FINDINGS: Right-sided  Port-A-Cath with tip at the cavoatrial junction. There are bibasilar linear atelectasis. No focal consolidation, pleural effusion, or pneumothorax. There is background of mild chronic bronchitic changes. The cardiac silhouette is within limits. No acute osseous pathology. IMPRESSION: No active cardiopulmonary disease. Electronically Signed   By: Anner Crete M.D.   On: 07/17/2020 17:08     Labs:   Basic Metabolic Panel: Recent Labs  Lab 07/18/20 0410 07/18/20 0410 07/19/20 0426 07/19/20 0426 07/20/20 0336 07/20/20 0336 07/21/20 0422 07/22/20 0828  NA 141  --  140  --  138  --  138 140  K 4.0   < > 4.0   < > 3.6   < > 3.2* 3.1*  CL 107  --  107  --  106  --  104 107  CO2 24  --  26  --  24  --  27 25  GLUCOSE 162*  --  134*  --  92  --  88 133*  BUN 9  --  13  --  14  --  8 6*  CREATININE 0.76  --  0.73  --  0.75  --  0.62 0.62  CALCIUM 8.4*  --  8.1*  --  7.7*  --  8.0* 8.0*  MG 2.0  --   --   --   --   --  2.1  --   PHOS  --   --   --   --   --   --  3.9  --    < > = values in this interval not displayed.   GFR Estimated Creatinine Clearance: 61.1 mL/min (by C-G formula based on SCr of 0.62 mg/dL). Liver Function Tests: Recent Labs  Lab 07/16/20 1449 07/17/20 1720 07/18/20 0410 07/20/20 0336  AST 38 85* 97* 54*  ALT 28 65* 83* 71*  ALKPHOS 98 90 93 97  BILITOT 0.6 0.6 0.8 0.5  PROT 6.3* 6.4* 5.7* 5.0*  ALBUMIN 3.2* 3.3* 2.8* 2.5*   No results for input(s): LIPASE, AMYLASE in the last 168 hours. No results for input(s): AMMONIA in the last 168 hours. Coagulation profile Recent Labs  Lab 07/17/20 1720 07/18/20 0410  INR 1.1 1.2    CBC: Recent Labs  Lab 07/16/20 1449 07/16/20 1449 07/17/20 1720 07/18/20 0410 07/19/20 0426 07/20/20 0336 07/20/20 1239  WBC 4.4  --  4.4 3.9* 5.6 4.6 3.9*  NEUTROABS 3.7  --  3.8 3.6 4.8  --  3.1  HGB 13.5  --  13.4 12.4 11.2* 10.6* 10.2*  HCT 40.3   < > 40.3 37.9 34.2* 32.7* 31.4*  MCV 92.2   < > 93.5 95.2 95.0 95.6  96.0  PLT 144*  --  119* 104* 104* 107* 100*   < > = values in this interval not displayed.   Cardiac Enzymes: No results for input(s): CKTOTAL, CKMB, CKMBINDEX, TROPONINI in the last 168 hours. BNP: Invalid input(s): POCBNP CBG: No results for input(s): GLUCAP in the last 168 hours. D-Dimer No results for input(s): DDIMER in the last 72 hours. Hgb A1c No results for input(s): HGBA1C in the last 72 hours. Lipid Profile No results for input(s): CHOL, HDL, LDLCALC, TRIG, CHOLHDL, LDLDIRECT in the last 72 hours. Thyroid function studies No results for input(s): TSH, T4TOTAL, T3FREE, THYROIDAB in the last 72 hours.  Invalid input(s): FREET3 Anemia work up No results for input(s): VITAMINB12, FOLATE, FERRITIN, TIBC, IRON, RETICCTPCT in the last 72 hours. Microbiology Recent Results (from the past 240 hour(s))  Urine Culture     Status: Abnormal   Collection Time: 07/16/20  3:12 PM   Specimen: Urine, Clean Catch  Result Value Ref Range Status   Specimen Description   Final    URINE, CLEAN CATCH Performed at [facility], [address] [place], [place] [address]    Special Requests   Final    NONE Performed at [facility], [place] [address] [place], [place] [address]    Culture (A)  Final    <10,000 COLONIES/mL INSIGNIFICANT GROWTH Performed at [facility] [address], [place], [place] [address]    Report Status 07/17/2020 FINAL  Final  Culture, blood (Routine x 2)     Status: None   Collection Time: 07/17/20  5:20 PM   Specimen: Site Not Specified; Blood  Result Value Ref Range Status   Specimen Description   Final    SITE NOT SPECIFIED Performed at [facility] [address] [place], [place] [address]    Special Requests   Final    BOTTLES DRAWN AEROBIC AND ANAEROBIC Blood Culture results may not be optimal due to an inadequate volume of blood received in culture bottles Performed at  [facility], [place] [address], [place], [place] [address]    Culture   Final    NO GROWTH 5 DAYS Performed at [facility], [place] [address], [place], [place] [address]    Report Status 07/22/2020 FINAL  Final  Culture, blood (Routine x 2)     Status: None   Collection Time: 07/17/20  5:21 PM   Specimen: Site Not Specified; Blood  Result Value Ref Range Status   Specimen Description   Final    SITE NOT SPECIFIED Performed at [facility] [address] [place], [place] [address]    Special Requests   Final    BOTTLES DRAWN AEROBIC AND ANAEROBIC Blood Culture adequate volume Performed at [facility] [address], [place], [place] [address]    Culture   Final    NO GROWTH 5 DAYS Performed at [facility], [place] [address] [place], [place] [address]    Report Status 07/22/2020 FINAL  Final  Respiratory Panel by RT PCR (Flu A&B, Covid) - Nasopharyngeal Swab     Status: None   Collection Time: 07/17/20  5:21 PM   Specimen: Nasopharyngeal Swab  Result Value Ref Range Status   SARS Coronavirus 2 by RT PCR NEGATIVE NEGATIVE Final    Comment: (NOTE) SARS-CoV-2 target nucleic acids are NOT DETECTED.  The SARS-CoV-2 RNA is generally detectable in upper respiratoy specimens during the acute phase of infection. The lowest concentration of SARS-CoV-2 viral copies this assay can detect is 131 copies/mL. A negative result does not preclude SARS-Cov-2 infection and should not be used as the sole basis for treatment or other patient management decisions. A negative result may occur with  improper specimen collection/handling, submission of specimen other than nasopharyngeal swab, presence of viral mutation(s) within the areas targeted by this assay, and inadequate number of viral copies (<131 copies/mL). A negative result must be combined with clinical observations, patient history, and epidemiological information.  The expected result is Negative.  Fact Sheet for Patients:  PinkCheek.be  Fact Sheet for Healthcare Providers:  GravelBags.it  This test is no t yet approved or cleared by the [place] FDA and  has been authorized for detection and/or diagnosis of SARS-CoV-2 by FDA under an Emergency Use Authorization (EUA). This EUA will remain  in effect (meaning this test can be used) for the duration of the COVID-19 declaration under Section 564(b)(1) of the Act, 21 U.S.C. section 360bbb-3(b)(1), unless the authorization is terminated or revoked sooner.     Influenza A by PCR NEGATIVE NEGATIVE Final   Influenza B by PCR NEGATIVE NEGATIVE Final    Comment: (NOTE) The Xpert Xpress SARS-CoV-2/FLU/RSV assay is intended as an aid in  the diagnosis of influenza from Nasopharyngeal swab specimens and  should not be used as a sole basis for treatment. Nasal washings and  aspirates are unacceptable for Xpert Xpress SARS-CoV-2/FLU/RSV  testing.  Fact Sheet for Patients: PinkCheek.be  Fact Sheet for Healthcare Providers: GravelBags.it  This test is not yet approved or cleared by the [place] FDA and  has been authorized for detection and/or diagnosis of SARS-CoV-2 by  FDA under an Emergency Use Authorization (EUA). This EUA will remain  in effect (meaning this test can be used) for the duration of the  Covid-19 declaration under Section 564(b)(1) of the Act, 21  U.S.C. section 360bbb-3(b)(1), unless the authorization is  terminated or revoked. Performed at [facility], [place] [address], [place], [place] [address]  Urine culture     Status: None   Collection Time: 07/17/20  6:16 PM   Specimen: In/Out Cath Urine  Result Value Ref Range Status   Specimen Description   Final    IN/OUT CATH URINE Performed at [facility], [place]  [address], [place], [place] [address]    Special Requests   Final    NONE Performed at [facility], [place] [address], [place], [place] [address]    Culture   Final    NO GROWTH Performed at [facility], [place] [address] [place], [place] [address]    Report Status 07/19/2020 FINAL  Final     Discharge Instructions:   Discharge Instructions    Call MD for:  difficulty breathing, headache or visual disturbances   Complete by: As directed    Call MD for:  persistant nausea and vomiting   Complete by: As directed    Call MD for:  temperature >100.4   Complete by: As directed    Diet - low sodium heart healthy   Complete by: As directed    Discharge instructions   Complete by: As directed    Complete the course of antibiotics. Increase fluid intake. Follow up with your primary care provider in 1 week.  Follow-up with oncology and has been scheduled.  Please check blood work in the next visit.  You have been given potassium supplements at this time.   Increase activity slowly   Complete by: As directed      Allergies as of 07/22/2020      Reactions   Codeine Nausea And Vomiting   Penicillins Swelling, Other (See Comments)   Swelling  Of joints  Did it involve swelling of the face/tongue/throat, SOB, or low BP? No Did it involve sudden or severe rash/hives, skin peeling, or any reaction on the inside of your mouth or nose? No Did you need to seek medical attention at a hospital or doctor's office? No When did it last happen? Childhood allergy **ALLERGIC TO CEPHALOSPORINS**   Keflex [cephalexin] Rash   Latex Rash, Other (See Comments)   redness   Sulfa Antibiotics Rash      Medication List    STOP taking these medications   cephALEXin 500 MG capsule Commonly known as: KEFLEX   lidocaine-prilocaine cream Commonly known as: EMLA     TAKE these medications   acetaminophen 500 MG tablet Commonly known as: TYLENOL Take 1,000 mg by mouth every 6  (six) hours as needed for moderate pain or headache.   acyclovir 400 MG tablet Commonly known as: ZOVIRAX Take 1 tablet (400 mg total) by mouth 2 (two) times daily.   allopurinol 300 MG tablet Commonly known as: ZYLOPRIM Take 1 tablet (300 mg total) by mouth daily.   ARIPiprazole 2 MG tablet Commonly known as: ABILIFY Take 2 mg by mouth daily.   aspirin 81 MG tablet Take 81 mg by mouth at bedtime.   ciprofloxacin 500 MG tablet Commonly known as: Cipro Take 1 tablet (500 mg total) by mouth 2 (two) times daily for 3 days.   [facility] FIBER GUMMIES PO Take 2 each by mouth daily.   EPINEPHrine 0.3 mg/0.3 mL Soaj injection Commonly known as: EPI-PEN Inject 0.3 mg into the muscle as needed for anaphylaxis.   GLUCOSAMINE-CHONDROITIN-MSM PO Take 1 tablet by mouth 2 (two) times a day.   levalbuterol 45 MCG/ACT inhaler Commonly known as: XOPENEX HFA Inhale 1 puff into the lungs every 4 (  four) hours as needed for wheezing or shortness of breath.   melatonin 5 MG Tabs Take 20 mg by mouth at bedtime.   NONFORMULARY OR COMPOUNDED ITEM Estradiol 0.02% Insert 1 gram vaginally 2 times per week. 3 month supply with 3 refills.   nystatin cream Commonly known as: MYCOSTATIN Apply 1 application topically 2 (two) times daily. Apply to affected area BID for up to 7 days.   ondansetron 8 MG tablet Commonly known as: ZOFRAN Take 1 tablet (8 mg total) by mouth every 8 (eight) hours as needed for nausea or vomiting.   pantoprazole 40 MG tablet Commonly known as: PROTONIX Take 40 mg by mouth 2 (two) times daily.   PARoxetine 40 MG tablet Commonly known as: PAXIL Take 40 mg by mouth daily.   phenazopyridine 100 MG tablet Commonly known as: Pyridium Take 1 tablet (100 mg total) by mouth 3 (three) times daily as needed for pain.   potassium chloride 10 MEQ tablet Commonly known as: KLOR-CON Take 2 tablets (20 mEq total) by mouth daily.   pravastatin 40 MG tablet Commonly known as:  PRAVACHOL Take 40 mg by mouth at bedtime.   prochlorperazine 10 MG tablet Commonly known as: COMPAZINE Take 1 tablet (10 mg total) by mouth every 6 (six) hours as needed for nausea or vomiting.   Singulair 10 MG tablet Generic drug: montelukast Take 10 mg by mouth at bedtime.   Vitamin D (Ergocalciferol) 1.25 MG (50000 UNIT) Caps capsule Commonly known as: DRISDOL Take 50,000 Units by mouth every Monday.         Time coordinating discharge: 39 minutes  Signed:  Josuel Koeppen  [facility] 07/22/2020, 1:30 PM

## 2020-07-22 NOTE — Progress Notes (Signed)
Pt's PAC easily flushable with cap change and positive, brisk blood return.. However. Unable to withdraw no more than 66ml waste. Updated the primary RN and is aware of pt status for lab.

## 2020-07-22 NOTE — Telephone Encounter (Signed)
Received call from pt's husband. He states that Alyssa Elliott is going home later this afternoon. He is asking if her appt for tomorrow can be cancelled. Advised that I have already cancelled those appts. Advised that it is ok for pt come on the 18th as already scheduled.  Alyssa Elliott is asking if Dr. Lorenso Courier could call him tomorrow to review the CT scan results with him and [name].  Tim states she is doing better -just very weak. No other questions or concerns

## 2020-07-23 ENCOUNTER — Inpatient Hospital Stay: Payer: Medicare Other | Admitting: Hematology and Oncology

## 2020-07-23 ENCOUNTER — Other Ambulatory Visit: Payer: Self-pay | Admitting: Hematology and Oncology

## 2020-07-23 ENCOUNTER — Telehealth: Payer: Self-pay | Admitting: *Deleted

## 2020-07-23 ENCOUNTER — Telehealth: Payer: Self-pay | Admitting: Hematology and Oncology

## 2020-07-23 ENCOUNTER — Inpatient Hospital Stay: Payer: Medicare Other

## 2020-07-23 MED ORDER — PREDNISONE 20 MG PO TABS: 40.0000 mg | ORAL_TABLET | Freq: Every day | ORAL | 0 refills | Status: DC

## 2020-07-23 MED ORDER — CIPROFLOXACIN HCL 500 MG PO TABS: 500.0000 mg | ORAL_TABLET | Freq: Two times a day (BID) | ORAL | 0 refills | Status: DC

## 2020-07-23 MED FILL — CIPROFLOXACIN HCL 500 MG TA: 500 | 3 days supply | Qty: 6 | Fill #0

## 2020-07-23 NOTE — Telephone Encounter (Signed)
Scheduled appt per 11/4 sch msg - pt is aware of appt date and time

## 2020-07-23 NOTE — Progress Notes (Signed)
pred 

## 2020-07-23 NOTE — Telephone Encounter (Signed)
Received call from pt. [facility]  [name] [place] Spoke with pharmacist. He states that their Cipro is back ordered and was unsure of when it would be available. [name] [facility] to check for availability. They do have the cipro. New prescription for Cipro 500 mg BID x 3 days was provided to pharmacist. [name] patient and made her aware of new prescription.

## 2020-07-23 NOTE — Telephone Encounter (Signed)
Received call from pt's husband, Tim. He states that Alyssa Elliott has started feeling badly again this afternoon with temperatures of 100.3-100.5.  He states she is very weak, face is flushed, sleeping  A lot.  Overall she does not feel well. She was discharged from the hospital yesterday and was feeling fair, but feels worse this afternoon.  Advised that I would discuss with Dr. Lorenso Courier and would call them back.  Dr. Lorenso Courier called pt/husband

## 2020-07-27 ENCOUNTER — Telehealth: Payer: Self-pay | Admitting: *Deleted

## 2020-07-27 NOTE — Telephone Encounter (Signed)
[name] patient to follow up after hospital discharge last week. She states she feels much better. Denies fevers, chills, nausea, vomiting or diarrhea. She states her energy is gradually coming back. Her appetite is still a bit off but she is pushing herself to eat. Drinking fluids well. Denies any problem with incontinence or dysuria. She is aware of her appts here on Friday, 07/31/20

## 2020-07-31 ENCOUNTER — Other Ambulatory Visit: Payer: Self-pay

## 2020-07-31 ENCOUNTER — Inpatient Hospital Stay: Payer: Medicare Other

## 2020-07-31 ENCOUNTER — Inpatient Hospital Stay: Payer: Medicare Other | Attending: Hematology and Oncology | Admitting: Hematology and Oncology

## 2020-07-31 ENCOUNTER — Other Ambulatory Visit: Payer: Self-pay | Admitting: Hematology and Oncology

## 2020-07-31 VITALS — BP 133/73 | HR 82 | Temp 98.6°F | Resp 17 | Ht 64.0 in | Wt 152.3 lb

## 2020-07-31 DIAGNOSIS — Z5111 Encounter for antineoplastic chemotherapy: Secondary | ICD-10-CM | POA: Diagnosis present

## 2020-07-31 DIAGNOSIS — C8288 Other types of follicular lymphoma, lymph nodes of multiple sites: Secondary | ICD-10-CM | POA: Insufficient documentation

## 2020-07-31 DIAGNOSIS — Z803 Family history of malignant neoplasm of breast: Secondary | ICD-10-CM | POA: Insufficient documentation

## 2020-07-31 DIAGNOSIS — Z5112 Encounter for antineoplastic immunotherapy: Secondary | ICD-10-CM | POA: Insufficient documentation

## 2020-07-31 DIAGNOSIS — E785 Hyperlipidemia, unspecified: Secondary | ICD-10-CM | POA: Insufficient documentation

## 2020-07-31 DIAGNOSIS — C8256 Diffuse follicle center lymphoma, intrapelvic lymph nodes: Secondary | ICD-10-CM

## 2020-07-31 DIAGNOSIS — C8253 Diffuse follicle center lymphoma, intra-abdominal lymph nodes: Secondary | ICD-10-CM

## 2020-07-31 DIAGNOSIS — Z8249 Family history of ischemic heart disease and other diseases of the circulatory system: Secondary | ICD-10-CM | POA: Diagnosis not present

## 2020-07-31 DIAGNOSIS — F418 Other specified anxiety disorders: Secondary | ICD-10-CM | POA: Diagnosis not present

## 2020-07-31 DIAGNOSIS — Z95828 Presence of other vascular implants and grafts: Secondary | ICD-10-CM | POA: Insufficient documentation

## 2020-07-31 DIAGNOSIS — Z7982 Long term (current) use of aspirin: Secondary | ICD-10-CM | POA: Insufficient documentation

## 2020-07-31 DIAGNOSIS — Z833 Family history of diabetes mellitus: Secondary | ICD-10-CM | POA: Diagnosis not present

## 2020-07-31 DIAGNOSIS — E559 Vitamin D deficiency, unspecified: Secondary | ICD-10-CM | POA: Insufficient documentation

## 2020-07-31 DIAGNOSIS — Z808 Family history of malignant neoplasm of other organs or systems: Secondary | ICD-10-CM | POA: Insufficient documentation

## 2020-07-31 DIAGNOSIS — Z79899 Other long term (current) drug therapy: Secondary | ICD-10-CM | POA: Insufficient documentation

## 2020-07-31 LAB — CBC WITH DIFFERENTIAL (CANCER CENTER ONLY)
Abs Immature Granulocytes: 0.04 10*3/uL (ref 0.00–0.07)
Basophils Absolute: 0 10*3/uL (ref 0.0–0.1)
Basophils Relative: 1 %
Eosinophils Absolute: 0 10*3/uL (ref 0.0–0.5)
Eosinophils Relative: 0 %
HCT: 40.5 % (ref 36.0–46.0)
Hemoglobin: 13.2 g/dL (ref 12.0–15.0)
Immature Granulocytes: 1 %
Lymphocytes Relative: 4 %
Lymphs Abs: 0.3 10*3/uL — ABNORMAL LOW (ref 0.7–4.0)
MCH: 30.6 pg (ref 26.0–34.0)
MCHC: 32.6 g/dL (ref 30.0–36.0)
MCV: 94 fL (ref 80.0–100.0)
Monocytes Absolute: 0.2 10*3/uL (ref 0.1–1.0)
Monocytes Relative: 2 %
Neutro Abs: 6.6 10*3/uL (ref 1.7–7.7)
Neutrophils Relative %: 92 %
Platelet Count: 216 10*3/uL (ref 150–400)
RBC: 4.31 MIL/uL (ref 3.87–5.11)
RDW: 13.9 % (ref 11.5–15.5)
WBC Count: 7.1 10*3/uL (ref 4.0–10.5)
nRBC: 0 % (ref 0.0–0.2)

## 2020-07-31 LAB — CMP (CANCER CENTER ONLY)
ALT: 38 U/L (ref 0–44)
AST: 30 U/L (ref 15–41)
Albumin: 3.3 g/dL — ABNORMAL LOW (ref 3.5–5.0)
Alkaline Phosphatase: 107 U/L (ref 38–126)
Anion gap: 7 (ref 5–15)
BUN: 17 mg/dL (ref 8–23)
CO2: 27 mmol/L (ref 22–32)
Calcium: 9 mg/dL (ref 8.9–10.3)
Chloride: 106 mmol/L (ref 98–111)
Creatinine: 0.77 mg/dL (ref 0.44–1.00)
GFR, Estimated: >60 mL/min (ref >60)
Glucose, Bld: 134 mg/dL — ABNORMAL HIGH (ref 70–99)
Potassium: 4.3 mmol/L (ref 3.5–5.1)
Sodium: 140 mmol/L (ref 135–145)
Total Bilirubin: 0.5 mg/dL (ref 0.3–1.2)
Total Protein: 6.5 g/dL (ref 6.5–8.1)

## 2020-07-31 LAB — LACTATE DEHYDROGENASE: LDH: 255 U/L — ABNORMAL HIGH (ref 98–192)

## 2020-07-31 MED ORDER — SODIUM CHLORIDE 0.9% FLUSH
10.0000 mL | Freq: Once | INTRAVENOUS | Status: AC
  Administered 2020-07-31: 10 mL
  Filled 2020-07-31: qty 10

## 2020-07-31 MED ORDER — NYSTATIN 100000 UNIT/ML MT SUSP: 5.0000 mL | Freq: Four times a day (QID) | OROMUCOSAL | 0 refills | Status: DC

## 2020-07-31 MED ORDER — HEPARIN SOD (PORK) LOCK FLUSH 100 UNIT/ML IV SOLN
500.0000 [IU] | Freq: Once | INTRAVENOUS | Status: AC
  Administered 2020-07-31: 500 [IU]
  Filled 2020-07-31: qty 5

## 2020-07-31 MED ORDER — PREDNISONE 20 MG PO TABS: 20.0000 mg | ORAL_TABLET | Freq: Every day | ORAL | 0 refills | Status: DC

## 2020-07-31 NOTE — Progress Notes (Signed)
[place] Telephone:([phone]   [phone]  PROGRESS NOTE  Patient Care Team: Prince Solian, MD as PCP - General (Internal Medicine) Mcarthur Rossetti, MD as Consulting Physician (Orthopedic Surgery)  Hematological/Oncological History # Non-Hodgkin B Cell Lymphoma, Follicular Lymphoma. Stage III 1) 02/25/2020: patient underwent excision of a 1.3 cm mobile nodule on the left vertex of the scalp. 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  2) 03/18/2020: Establish care with Dr. Lorenso Courier  3) 03/30/2020: PET CT scan showed  involvement of the left scalp, left axillary lymph node, left hilar and infrahilar nodes, subcarinal node, and mesenteric lymph nodes, as well as a masslike appearance/ high metabolic activity in the lower uterus/cervix.  4) 04/21/2020: biopsy of the mass in the lower uterus/cervix consistent with follicular lymphoma.  5) 06/25/2020: CT C/A/P showed the soft tissue mass in the abdomen increase in size from 5.8 x 3.6 cm to 6.5 x 5.7 cm. The other lymphadenopathy was stable. Patient elected to move forward with treatment.  6) 07/06/2020: Cycle 1 Day 1 of Rituximab/Bendamustine 7) 07/17/2020-07/22/2020: Admitted to [facility] with fevers. Infectious workup negative, but scans show mesenteric panniculitis (fibrosing mesenteritis). Started on prednisone 40mg  PO daily.  8) 08/06/2020: start of Rituximab monotherapy.   Interval History:  Alyssa Elliott 69 y.o. female with medical history significant for diffuse non Hodgkin B cell lymphoma who presents for a follow up visit. The patient's last visit was on 06/30/2020 prior to the start of Cycle 1 of R-Benda. In the interim since her last visit she received Cycle 1 but was admitted to [facility] from 07/17/2020 to 07/22/2020 with fever of unclear etiology.   On exam today Alyssa Elliott is accompanied by her husband.  She reports that  since she started the steroid therapy she has been feeling better.  Also the fevers resolved with the start of 40 mg prednisone p.o. daily.  She notes that she has had a rebound in energy but is not back to baseline.  She has been walking more and doing more activities at home.  Fortunately she has not been having any recurrent fevers, chills, sweats, nausea, vomiting or diarrhea.  Interestingly she has never had any abdominal pain throughout the course of this.  She is not having any urinary symptoms or bowel disruptions.  Otherwise she is doing quite well.  A full 10 point ROS is listed below.  The bulk of our discussion today focused on treatment options moving forward.  These are detailed below in the assessment and plan.  MEDICAL HISTORY:  Past Medical History:  Diagnosis Date  . Allergy   . Anxiety   . Asthma   . Cataract 2018   removed  . Complication of anesthesia    drop in BP  . DDD (degenerative disc disease)   . Depression   . GERD (gastroesophageal reflux disease)   . Hemorrhoids   . Hyperlipidemia   . Lymphoma ([facility]) dx 2021   no tx for now  . Vitamin D deficiency     SURGICAL HISTORY: Past Surgical History:  Procedure Laterality Date  . ATRIAL ABLATION SURGERY  1997   SVT  . cataract surgery Bilateral 2017  . COLONOSCOPY  2016  . FOOT NEUROMA SURGERY     multiple times bilaterally  . IR IMAGING GUIDED PORT INSERTION  07/13/2020  . PLANTAR FASCIA RELEASE Left 1999 or 2000  . SHOULDER ARTHROSCOPY Left 04/19/2019   Procedure: LEFT SHOULDER MANIPULATION  UNDER ANESTHESIA AND ARTHROSCOPY WITH EXTENSIVE DEBRIDEMENT;  Surgeon: Mcarthur Rossetti, MD;  Location: [facility] ORS;  Service: Orthopedics;  Laterality: Left;  . UPPER GI ENDOSCOPY  yrs ago    SOCIAL HISTORY: Social History   Socioeconomic History  . Marital status: Married    Spouse name: Not on file  . Number of children: 0  . Years of education: Not on file  . Highest education level: Not on file   Occupational History  . Occupation: retired Catering manager  Tobacco Use  . Smoking status: Never Smoker  . Smokeless tobacco: Never Used  Vaping Use  . Vaping Use: Never used  Substance and Sexual Activity  . Alcohol use: No  . Drug use: No  . Sexual activity: Not Currently    Partners: Male    Birth control/protection: Post-menopausal  Other Topics Concern  . Not on file  Social History Narrative  . Not on file   Social Determinants of Health   Financial Resource Strain:   . Difficulty of Paying Living Expenses: Not on file  Food Insecurity:   . Worried About Charity fundraiser in the Last Year: Not on file  . Ran Out of Food in the Last Year: Not on file  Transportation Needs:   . Lack of Transportation (Medical): Not on file  . Lack of Transportation (Non-Medical): Not on file  Physical Activity:   . Days of Exercise per Week: Not on file  . Minutes of Exercise per Session: Not on file  Stress:   . Feeling of Stress : Not on file  Social Connections:   . Frequency of Communication with Friends and Family: Not on file  . Frequency of Social Gatherings with Friends and Family: Not on file  . Attends Religious Services: Not on file  . Active Member of Clubs or Organizations: Not on file  . Attends Archivist Meetings: Not on file  . Marital Status: Not on file  Intimate Partner Violence:   . Fear of Current or Ex-Partner: Not on file  . Emotionally Abused: Not on file  . Physically Abused: Not on file  . Sexually Abused: Not on file    FAMILY HISTORY: Family History  Problem Relation Age of Onset  . Heart disease Mother   . Diabetes Mother   . Tuberculosis Paternal Grandfather   . Bone cancer Paternal Grandfather   . Breast cancer Sister 25  . Melanoma Sister   . Cirrhosis Maternal Grandfather   . Heart failure Paternal Grandmother   . Colon cancer Neg Hx   . Ovarian cancer Neg Hx   . Uterine cancer Neg Hx     ALLERGIES:  is allergic to  codeine, penicillins, keflex [cephalexin], latex, and sulfa antibiotics.  MEDICATIONS:  Current Outpatient Medications  Medication Sig Dispense Refill  . acetaminophen (TYLENOL) 500 MG tablet Take 1,000 mg by mouth every 6 (six) hours as needed for moderate pain or headache.    Marland Kitchen acyclovir (ZOVIRAX) 400 MG tablet Take 1 tablet (400 mg total) by mouth 2 (two) times daily. 180 tablet 1  . allopurinol (ZYLOPRIM) 300 MG tablet Take 1 tablet (300 mg total) by mouth daily. 90 tablet 1  . ARIPiprazole (ABILIFY) 2 MG tablet Take 2 mg by mouth daily.     Marland Kitchen aspirin 81 MG tablet Take 81 mg by mouth at bedtime.     . [employer] FIBER GUMMIES PO Take 2 each by mouth daily.     Marland Kitchen EPINEPHrine  0.3 mg/0.3 mL IJ SOAJ injection Inject 0.3 mg into the muscle as needed for anaphylaxis.     Marland Kitchen GLUCOSAMINE-CHONDROITIN-MSM PO Take 1 tablet by mouth 2 (two) times a day.    . levalbuterol (XOPENEX HFA) 45 MCG/ACT inhaler Inhale 1 puff into the lungs every 4 (four) hours as needed for wheezing or shortness of breath. (Patient not taking: Reported on 07/17/2020) 1 Inhaler 3  . Melatonin 5 MG TABS Take 20 mg by mouth at bedtime.     . NONFORMULARY OR COMPOUNDED ITEM Estradiol 0.02% Insert 1 gram vaginally 2 times per week. 3 month supply with 3 refills. (Patient not taking: Reported on 07/17/2020) 3 each 3  . nystatin cream (MYCOSTATIN) Apply 1 application topically 2 (two) times daily. Apply to affected area BID for up to 7 days. (Patient not taking: Reported on 07/17/2020) 30 g 0  . ondansetron (ZOFRAN) 8 MG tablet Take 1 tablet (8 mg total) by mouth every 8 (eight) hours as needed for nausea or vomiting. 30 tablet 0  . pantoprazole (PROTONIX) 40 MG tablet Take 40 mg by mouth 2 (two) times daily.    Marland Kitchen PARoxetine (PAXIL) 40 MG tablet Take 40 mg by mouth daily.     . phenazopyridine (PYRIDIUM) 100 MG tablet Take 1 tablet (100 mg total) by mouth 3 (three) times daily as needed for pain. 21 tablet 0  . potassium chloride (KLOR-CON)  10 MEQ tablet Take 2 tablets (20 mEq total) by mouth daily. 15 tablet 0  . pravastatin (PRAVACHOL) 40 MG tablet Take 40 mg by mouth at bedtime.     . predniSONE (DELTASONE) 20 MG tablet Take 2 tablets (40 mg total) by mouth daily. 28 tablet 0  . prochlorperazine (COMPAZINE) 10 MG tablet Take 1 tablet (10 mg total) by mouth every 6 (six) hours as needed for nausea or vomiting. 30 tablet 0  . SINGULAIR 10 MG tablet Take 10 mg by mouth at bedtime.     . Vitamin D, Ergocalciferol, (DRISDOL) 50000 UNITS CAPS Take 50,000 Units by mouth every Monday.      No current facility-administered medications for this visit.    REVIEW OF SYSTEMS:   Constitutional: ( - ) fevers, ( - )  chills , ( - ) night sweats Eyes: ( - ) blurriness of vision, ( - ) double vision, ( - ) watery eyes Ears, nose, mouth, throat, and face: ( - ) mucositis, ( - ) sore throat Respiratory: ( - ) cough, ( - ) dyspnea, ( - ) wheezes Cardiovascular: ( - ) palpitation, ( - ) chest discomfort, ( - ) lower extremity swelling Gastrointestinal:  ( - ) nausea, ( - ) heartburn, ( - ) change in bowel habits Skin: ( - ) abnormal skin rashes Lymphatics: ( - ) new lymphadenopathy, ( - ) easy bruising Neurological: ( - ) numbness, ( - ) tingling, ( - ) new weaknesses Behavioral/Psych: ( - ) mood change, ( - ) new changes  All other systems were reviewed with the patient and are negative.  PHYSICAL EXAMINATION: ECOG PERFORMANCE STATUS: 1 - Symptomatic but completely ambulatory  Vitals:   07/31/20 1552  BP: 133/73  Pulse: 82  Resp: 17  Temp: 98.6 F (37 C)  SpO2: 98%   Filed Weights   07/31/20 1552  Weight: 152 lb 4.8 oz (69.1 kg)    GENERAL: well appearing elderly Caucasian female. alert, no distress and comfortable SKIN: scalp lesion appears to be growing, increased in size from  prior visit. skin color, texture, turgor are normal, no rashes or significant lesions EYES: conjunctiva are pink and non-injected, sclera clear LYMPH:   no palpable lymphadenopathy in the cervical, axillary or supraclavicular LUNGS: clear to auscultation and percussion with normal breathing effort HEART: regular rate & rhythm and no murmurs and no lower extremity edema Musculoskeletal: no cyanosis of digits and no clubbing  PSYCH: alert & oriented x 3, fluent speech NEURO: no focal motor/sensory deficits  LABORATORY DATA:  I have reviewed the data as listed CBC Latest Ref Rng & Units 07/31/2020 07/20/2020 07/20/2020  WBC 4.0 - 10.5 K/uL 7.1 3.9(L) 4.6  Hemoglobin 12.0 - 15.0 g/dL 13.2 10.2(L) 10.6(L)  Hematocrit 36 - 46 % 40.5 31.4(L) 32.7(L)  Platelets 150 - 400 K/uL 216 100(L) 107(L)    CMP Latest Ref Rng & Units 07/22/2020 07/21/2020 07/20/2020  Glucose 70 - 99 mg/dL 133(H) 88 92  BUN 8 - 23 mg/dL 6(L) 8 14  Creatinine 0.44 - 1.00 mg/dL 0.62 0.62 0.75  Sodium 135 - 145 mmol/L 140 138 138  Potassium 3.5 - 5.1 mmol/L 3.1(L) 3.2(L) 3.6  Chloride 98 - 111 mmol/L 107 104 106  CO2 22 - 32 mmol/L 25 27 24   Calcium 8.9 - 10.3 mg/dL 8.0(L) 8.0(L) 7.7(L)  Total Protein 6.5 - 8.1 g/dL - - 5.0(L)  Total Bilirubin 0.3 - 1.2 mg/dL - - 0.5  Alkaline Phos 38 - 126 U/L - - 97  AST 15 - 41 U/L - - 54(H)  ALT 0 - 44 U/L - - 71(H)    No results found for: MPROTEIN No results found for: KPAFRELGTCHN, LAMBDASER, KAPLAMBRATIO   RADIOGRAPHIC STUDIES: I have personally reviewed the radiological images as listed and agreed with the findings in the report: apparent resolution of previously noted lymphadenopathy.   DG Chest 2 View  Result Date: 07/17/2020 CLINICAL DATA:  69 year old female with suspected sepsis. EXAM: CHEST - 2 VIEW COMPARISON:  Chest radiograph dated 09/02/2019. FINDINGS: Right-sided Port-A-Cath with tip at the cavoatrial junction. There are bibasilar linear atelectasis. No focal consolidation, pleural effusion, or pneumothorax. There is background of mild chronic bronchitic changes. The cardiac silhouette is within limits. No acute  osseous pathology. IMPRESSION: No active cardiopulmonary disease. Electronically Signed   By: Anner Crete M.D.   On: 07/17/2020 17:08   CT CHEST ABDOMEN PELVIS W CONTRAST  Result Date: 07/20/2020 CLINICAL DATA:  Fever of unknown origin, history of diffuse non-Hodgkin B-cell lymphoma, sepsis, UTI EXAM: CT CHEST, ABDOMEN, AND PELVIS WITH CONTRAST TECHNIQUE: Multidetector CT imaging of the chest, abdomen and pelvis was performed following the standard protocol during bolus administration of intravenous contrast. Sagittal and coronal MPR images reconstructed from axial data set. CONTRAST:  139mL OMNIPAQUE IOHEXOL 300 MG/ML SOLN IV. No oral contrast. COMPARISON:  06/25/2020 FINDINGS: CT CHEST FINDINGS Cardiovascular: RIGHT jugular Port-A-Cath, tip in SVC near cavoatrial junction. Thoracic vascular structures grossly patent on non targeted exam. Heart normal size. Aorta normal caliber. No pericardial effusion. Mediastinum/Nodes: Few normal size mediastinal lymph nodes. No thoracic adenopathy. Esophagus unremarkable. Base of cervical region normal appearance. Lungs/Pleura: Minimal dependent atelectasis slightly greater on LEFT. Linear subsegmental atelectasis in lingula. Central peribronchial thickening. No pulmonary infiltrate, pleural effusion or pneumothorax. Musculoskeletal: Osseous demineralization. Sclerotic focus T4 vertebral body unchanged since 2014. No acute osseous findings. CT ABDOMEN PELVIS FINDINGS Hepatobiliary: Gallbladder and liver normal appearance Pancreas: Normal appearance Spleen: Normal appearance Adrenals/Urinary Tract: Adrenal glands, kidneys, ureters, and bladder normal appearance Stomach/Bowel: Minimal sigmoid diverticulosis without evidence of diverticulitis.  Stomach and bowel loops otherwise normal appearance. Normal appendix. Vascular/Lymphatic: Vascular structures patent. Aorta normal caliber. Few scattered normal size mesenteric and retroperitoneal lymph nodes without abdominal or  pelvic adenopathy. Reproductive: Uterus and ovaries unremarkable Other: Infiltrative changes throughout small-bowel mesentery consistent with fibrosing mesenteritis/mesenteric panniculitis, progressive since prior exam. No free air or free fluid. No hernia. Musculoskeletal: Mild degenerative disc disease changes at lower lumbar spine. Osseous demineralization. IMPRESSION: Minimal sigmoid diverticulosis without evidence of diverticulitis. Infiltrative changes of small-bowel mesentery centrally consistent with fibrosing mesenteritis/mesenteric panniculitis, progressive since prior exam. Bronchitic changes with minimal basilar atelectasis. No acute intrathoracic, intra-abdominal or intrapelvic abnormalities otherwise identified. Electronically Signed   By: Lavonia Dana M.D.   On: 07/20/2020 16:25   IR IMAGING GUIDED PORT INSERTION  Result Date: 07/13/2020 INDICATION: 69 year old female with history of lymphoma requiring central venous access for chemotherapy. EXAM: IMPLANTED PORT A CATH PLACEMENT WITH ULTRASOUND AND FLUOROSCOPIC GUIDANCE COMPARISON:  None. MEDICATIONS: Clindamycin 900 mg IV; The antibiotic was administered within an appropriate time interval prior to skin puncture. ANESTHESIA/SEDATION: Moderate (conscious) sedation was employed during this procedure. A total of Versed 4 mg and Fentanyl 100 mcg was administered intravenously. Moderate Sedation Time: 20 minutes. The patient's level of consciousness and vital signs were monitored continuously by radiology nursing throughout the procedure under my direct supervision. CONTRAST:  None FLUOROSCOPY TIME:  0.1 minutes, (2 mGy) COMPLICATIONS: None immediate. PROCEDURE: The procedure, risks, benefits, and alternatives were explained to the patient. Questions regarding the procedure were encouraged and answered. The patient understands and consents to the procedure. The right neck and chest were prepped with chlorhexidine in a sterile fashion, and a sterile  drape was applied covering the operative field. Maximum barrier sterile technique with sterile gowns and gloves were used for the procedure. A timeout was performed prior to the initiation of the procedure. Ultrasound was used to examine the jugular vein which was compressible and free of internal echoes. A skin marker was used to demarcate the planned venotomy and port pocket incision sites. Local anesthesia was provided to these sites and the subcutaneous tunnel track with 1% lidocaine with 1:100,000 epinephrine. A small incision was created at the jugular access site and blunt dissection was performed of the subcutaneous tissues. Under real time ultrasound guidance, the jugular vein was accessed with a 21 ga micropuncture needle and an 0.018" wire was inserted to the superior vena cava. A 5 Fr micopuncture set was then used, through which a 0.035" Rosen wire was passed under fluoroscopic guidance into the inferior vena cava. An 8 Fr dilator was then placed over the wire. A subcutaneous port pocket was then created along the upper chest wall utilizing a combination of sharp and blunt dissection. The pocket was irrigated with sterile saline, packed with gauze, and observed for hemorrhage. A single lumen "ISP" sized power injectable port was chosen for placement. The 8 Fr catheter was tunneled from the port pocket site to the venotomy incision. The port was placed in the pocket. The external catheter was trimmed to appropriate length. The dilator was exchanged for an 8 Fr peel-away sheath under fluoroscopic guidance. The catheter was then placed through the sheath and the sheath was removed. Final catheter positioning was confirmed and documented with a fluoroscopic spot radiograph. The port was accessed with a Huber needle, aspirated, and flushed with heparinized saline. The deep dermal layer of the port pocket incision was closed with interrupted 3-0 Vicryl suture. The skin was opposed with a running subcuticular  4-0  Monocryl suture. Dermabond was then placed over the port pocket and neck incisions. The patient tolerated the procedure well without immediate post procedural complication. FINDINGS: After catheter placement, the tip lies within the atrial junction the catheter aspirates and flushes normally and is ready for immediate use. IMPRESSION: Successful placement of a power injectable Port-A-Cath via the right internal jugular vein. The catheter is ready for immediate use. Ruthann Cancer, MD [facility] Electronically Signed   By: Ruthann Cancer MD   On: 07/13/2020 13:23    [place]brooks 69 y.o. female with medical history significant for diffuse non Hodgkin B cell lymphoma who presents for a follow up visit.  After review of the imaging, review the labs, discussion with the patient her findings are most consistent with a diffuse non-Hodgkin B-cell lymphoma, follicular lymphoma.  The initial diagnosis based off the biopsy from the lesion on the head was that of a follicle cell lymphoma which tends to be a cutaneous lymphoma, however this diagnosis was considerably less likely given the diffuse spread throughout the lymph nodes of the chest and abdomen. Biopsy of the FDG avid lesion in the pelvis resulted with follicular lymphoma.   On exam today Alyssa Elliott is accompanied by her husband.  Today we discussed the difficult reaction the patient had with her first round of bendamustine rituximab combination therapy.  We discussed that there is high risk of her developing worsening fibrosing mesenteritis with the treatment of these 2 agents together.  Additionally she tolerated the first round very poorly with marked fatigue and hospitalization due to fever.  Given these findings I would recommend that we proceed with rituximab therapy alone.  Supporting this decision is the fact that her recent CT scan while hospitalized showed a  remarkable response to therapy and therefore we have a good starting point with which to treat her with rituximab therapy.  Previously we discussed the diagnosis of follicular lymphoma and the options moving forward.  She currently does not meet any GELF Criteria and therefore is not required to start therapy.  We discussed that treatment could be started if she so desired and we discussed the risks and benefits of bendamustine rituximab therapy.  She voiced her understanding of the different options between starting treatment and continued observation.  After discussion with her husband she noted she would like to start treatment given the continued growth of the lesion which was abutting the urinary bladder.   The regimen of choice for this patient is Rituximab monotherapy with Rituximab 375mg /m2 IV weekly x 4 weeks, followed by IV rituximab q 8 weeks thereafter. This is being administered with curative intent.  In addition to this regimen from bendamustine rituximab due to intolerance and hospitalization following her first cycle of treatment.  # Non-Hodgkin B Cell Lymphoma, Follicular Lymphoma. Stage III --pre treatment CT findings showed lymphadenopathy on both sides of the diaphragm. Consistent with at least a Stage III lymphoma -- confirmed the diagnosis with biopsy of the FDG avid mass near the uterus/cervix. Cancel the left axillary lymph node biopsy --started treatment with R-Benda, however the patient ended up hospitalized due to fever, weakness, and marked drop in her labs.  She did not tolerate rituximab and bendamustine combination altogether and therefore we will proceed with rituximab monotherapy alone --completed chemotherapy education, port placement. Supportive care (noted below)   --RTC for continued rituximab next week.   #Supportive Care --Chemotherapy Education to be scheduled  --zofran 8mg   q8H PRN and compazine 10mg  PO q6H for nausea --acyclovir 400mg  PO BID for VCZ  prophylaxis -- allopurinol 300mg  PO daily for TLS prophylaxis -- EMLA cream for port -- no pain medication required at this time.   No orders of the defined types were placed in this encounter.  All questions were answered. The patient knows to call the clinic with any problems, questions or concerns.  A total of more than 30 minutes were spent on this encounter and over half of that time was spent on counseling and coordination of care as outlined above.   Ledell Peoples, MD Department of Hematology/Oncology [place] at [facility] Phone: [phone] Pager: [phone] Email: [email or web address]  07/31/2020 3:56 PM

## 2020-08-01 ENCOUNTER — Encounter: Payer: Self-pay | Admitting: Hematology and Oncology

## 2020-08-04 ENCOUNTER — Telehealth: Payer: Self-pay | Admitting: *Deleted

## 2020-08-04 ENCOUNTER — Other Ambulatory Visit: Payer: Self-pay | Admitting: Hematology and Oncology

## 2020-08-04 NOTE — Progress Notes (Signed)
DISCONTINUE ON PATHWAY REGIMEN - Lymphoma and CLL     A cycle is every 28 days:     Bendamustine      Rituximab-xxxx   **Always confirm dose/schedule in your pharmacy ordering system**  REASON: Toxicities / Adverse Event PRIOR TREATMENT: [id number]: Bendamustine + Rituximab IV (90/375) q28 Days x 6 Cycles TREATMENT RESPONSE: Unable to Evaluate  START ON PATHWAY REGIMEN - Lymphoma and CLL     Administer weekly:     Rituximab-xxxx   **Always confirm dose/schedule in your pharmacy ordering system**  Patient Characteristics: Follicular Lymphoma, Grades 1, 2, and 3A, First Line, Stage III / IV, Symptomatic or Bulky Disease Disease Type: Follicular Lymphoma, Grade 1, 2, or 3A Disease Type: Not Applicable Disease Type: Not Applicable Line of Therapy: First Line Disease Characteristics: Symptomatic or Bulky Disease Intent of Therapy: Curative Intent, Discussed with Patient

## 2020-08-04 NOTE — Telephone Encounter (Signed)
Patient called asking about recent changes discussed regarding her treatment plan since she had a reaction to her initial treatment plan of Bendeka/Rituxan.  She stated that she is supposed to be changed to an immunotherapy, no changes evident on chart.  Under the impression that her treatment plan will change she asks does she need to remain on the acyclovir/allopurinol.  Routed to MD to review Treatment plan as she is planned for old treatment plan this week.    Please review and advise about treatment plan and medication question.

## 2020-08-05 ENCOUNTER — Other Ambulatory Visit: Payer: Self-pay | Admitting: Hematology and Oncology

## 2020-08-05 ENCOUNTER — Ambulatory Visit: Payer: Medicare Other | Admitting: Obstetrics and Gynecology

## 2020-08-05 DIAGNOSIS — C8256 Diffuse follicle center lymphoma, intrapelvic lymph nodes: Secondary | ICD-10-CM

## 2020-08-06 ENCOUNTER — Inpatient Hospital Stay: Payer: Medicare Other

## 2020-08-06 ENCOUNTER — Other Ambulatory Visit: Payer: Self-pay

## 2020-08-06 ENCOUNTER — Ambulatory Visit: Payer: Medicare Other | Admitting: Hematology and Oncology

## 2020-08-06 VITALS — BP 122/65 | HR 65 | Temp 98.8°F | Resp 17

## 2020-08-06 DIAGNOSIS — Z7982 Long term (current) use of aspirin: Secondary | ICD-10-CM | POA: Diagnosis not present

## 2020-08-06 DIAGNOSIS — E559 Vitamin D deficiency, unspecified: Secondary | ICD-10-CM | POA: Diagnosis not present

## 2020-08-06 DIAGNOSIS — C8253 Diffuse follicle center lymphoma, intra-abdominal lymph nodes: Secondary | ICD-10-CM

## 2020-08-06 DIAGNOSIS — Z95828 Presence of other vascular implants and grafts: Secondary | ICD-10-CM

## 2020-08-06 DIAGNOSIS — Z5112 Encounter for antineoplastic immunotherapy: Secondary | ICD-10-CM | POA: Diagnosis not present

## 2020-08-06 DIAGNOSIS — C8288 Other types of follicular lymphoma, lymph nodes of multiple sites: Secondary | ICD-10-CM | POA: Diagnosis not present

## 2020-08-06 DIAGNOSIS — F418 Other specified anxiety disorders: Secondary | ICD-10-CM | POA: Diagnosis not present

## 2020-08-06 DIAGNOSIS — C8256 Diffuse follicle center lymphoma, intrapelvic lymph nodes: Secondary | ICD-10-CM

## 2020-08-06 DIAGNOSIS — E785 Hyperlipidemia, unspecified: Secondary | ICD-10-CM | POA: Diagnosis not present

## 2020-08-06 LAB — CBC WITH DIFFERENTIAL (CANCER CENTER ONLY)
Abs Immature Granulocytes: 0.05 10*3/uL (ref 0.00–0.07)
Basophils Absolute: 0 10*3/uL (ref 0.0–0.1)
Basophils Relative: 1 %
Eosinophils Absolute: 0.1 10*3/uL (ref 0.0–0.5)
Eosinophils Relative: 2 %
HCT: 39.8 % (ref 36.0–46.0)
Hemoglobin: 13.1 g/dL (ref 12.0–15.0)
Immature Granulocytes: 1 %
Lymphocytes Relative: 4 %
Lymphs Abs: 0.2 10*3/uL — ABNORMAL LOW (ref 0.7–4.0)
MCH: 31.3 pg (ref 26.0–34.0)
MCHC: 32.9 g/dL (ref 30.0–36.0)
MCV: 95 fL (ref 80.0–100.0)
Monocytes Absolute: 0.4 10*3/uL (ref 0.1–1.0)
Monocytes Relative: 8 %
Neutro Abs: 3.6 10*3/uL (ref 1.7–7.7)
Neutrophils Relative %: 84 %
Platelet Count: 207 10*3/uL (ref 150–400)
RBC: 4.19 MIL/uL (ref 3.87–5.11)
RDW: 14.6 % (ref 11.5–15.5)
WBC Count: 4.3 10*3/uL (ref 4.0–10.5)
nRBC: 0 % (ref 0.0–0.2)

## 2020-08-06 LAB — CMP (CANCER CENTER ONLY)
ALT: 43 U/L (ref 0–44)
AST: 24 U/L (ref 15–41)
Albumin: 3.1 g/dL — ABNORMAL LOW (ref 3.5–5.0)
Alkaline Phosphatase: 99 U/L (ref 38–126)
Anion gap: 10 (ref 5–15)
BUN: 12 mg/dL (ref 8–23)
CO2: 24 mmol/L (ref 22–32)
Calcium: 8.6 mg/dL — ABNORMAL LOW (ref 8.9–10.3)
Chloride: 107 mmol/L (ref 98–111)
Creatinine: 0.75 mg/dL (ref 0.44–1.00)
GFR, Estimated: >60 mL/min (ref >60)
Glucose, Bld: 79 mg/dL (ref 70–99)
Potassium: 3.5 mmol/L (ref 3.5–5.1)
Sodium: 141 mmol/L (ref 135–145)
Total Bilirubin: 0.6 mg/dL (ref 0.3–1.2)
Total Protein: 6 g/dL — ABNORMAL LOW (ref 6.5–8.1)

## 2020-08-06 LAB — LACTATE DEHYDROGENASE: LDH: 178 U/L (ref 98–192)

## 2020-08-06 MED ORDER — HEPARIN SOD (PORK) LOCK FLUSH 100 UNIT/ML IV SOLN
500.0000 [IU] | Freq: Once | INTRAVENOUS | Status: AC | PRN
  Administered 2020-08-06: 500 [IU]
  Filled 2020-08-06: qty 5

## 2020-08-06 MED ORDER — DIPHENHYDRAMINE HCL 25 MG PO CAPS
50.0000 mg | ORAL_CAPSULE | Freq: Once | ORAL | Status: AC
  Administered 2020-08-06: 50 mg via ORAL

## 2020-08-06 MED ORDER — SODIUM CHLORIDE 0.9 % IV SOLN
375.0000 mg/m2 | Freq: Once | INTRAVENOUS | Status: AC
  Administered 2020-08-06: 700 mg via INTRAVENOUS
  Filled 2020-08-06: qty 20

## 2020-08-06 MED ORDER — ACETAMINOPHEN 325 MG PO TABS
650.0000 mg | ORAL_TABLET | Freq: Once | ORAL | Status: AC
  Administered 2020-08-06: 650 mg via ORAL

## 2020-08-06 MED ORDER — SODIUM CHLORIDE 0.9% FLUSH
10.0000 mL | INTRAVENOUS | Status: DC | PRN
  Administered 2020-08-06: 10 mL
  Filled 2020-08-06: qty 10

## 2020-08-06 MED ORDER — ACETAMINOPHEN 325 MG PO TABS
ORAL_TABLET | ORAL | Status: AC
  Filled 2020-08-06: qty 2

## 2020-08-06 MED ORDER — DIPHENHYDRAMINE HCL 25 MG PO CAPS
ORAL_CAPSULE | ORAL | Status: AC
  Filled 2020-08-06: qty 2

## 2020-08-06 MED ORDER — SODIUM CHLORIDE 0.9 % IV SOLN
Freq: Once | INTRAVENOUS | Status: AC
  Filled 2020-08-06: qty 250

## 2020-08-06 MED ORDER — SODIUM CHLORIDE 0.9% FLUSH
10.0000 mL | Freq: Once | INTRAVENOUS | Status: AC
  Administered 2020-08-06: 10 mL
  Filled 2020-08-06: qty 10

## 2020-08-06 NOTE — Progress Notes (Signed)
Pt discharged in no apparent distress. Pt left ambulatory without assistance. Pt aware of discharge instructions and verbalized understanding and had no further questions.  

## 2020-08-06 NOTE — Patient Instructions (Signed)
[facility] Discharge Instructions for Patients Receiving Chemotherapy  Today you received the following chemotherapy agents Rituxin  To help prevent nausea and vomiting after your treatment, we encourage you to take your nausea medication as directed   If you develop nausea and vomiting that is not controlled by your nausea medication, call the clinic.   BELOW ARE SYMPTOMS THAT SHOULD BE REPORTED IMMEDIATELY:  *FEVER GREATER THAN 100.5 F  *CHILLS WITH OR WITHOUT FEVER  NAUSEA AND VOMITING THAT IS NOT CONTROLLED WITH YOUR NAUSEA MEDICATION  *UNUSUAL SHORTNESS OF BREATH  *UNUSUAL BRUISING OR BLEEDING  TENDERNESS IN MOUTH AND THROAT WITH OR WITHOUT PRESENCE OF ULCERS  *URINARY PROBLEMS  *BOWEL PROBLEMS  UNUSUAL RASH Items with * indicate a potential emergency and should be followed up as soon as possible.  Feel free to call the clinic should you have any questions or concerns. The clinic phone number is [phone].  Please show the [facility] at check-in to the Emergency Department and triage nurse.

## 2020-08-07 ENCOUNTER — Ambulatory Visit: Payer: Medicare Other

## 2020-08-12 ENCOUNTER — Other Ambulatory Visit: Payer: Self-pay | Admitting: Hematology and Oncology

## 2020-08-13 ENCOUNTER — Other Ambulatory Visit: Payer: Self-pay | Admitting: Hematology and Oncology

## 2020-08-13 DIAGNOSIS — C8253 Diffuse follicle center lymphoma, intra-abdominal lymph nodes: Secondary | ICD-10-CM

## 2020-08-14 ENCOUNTER — Ambulatory Visit (HOSPITAL_BASED_OUTPATIENT_CLINIC_OR_DEPARTMENT_OTHER): Payer: Medicare Other | Admitting: Medical

## 2020-08-14 ENCOUNTER — Other Ambulatory Visit: Payer: Self-pay

## 2020-08-14 ENCOUNTER — Inpatient Hospital Stay: Payer: Medicare Other

## 2020-08-14 DIAGNOSIS — E559 Vitamin D deficiency, unspecified: Secondary | ICD-10-CM | POA: Diagnosis not present

## 2020-08-14 DIAGNOSIS — C8253 Diffuse follicle center lymphoma, intra-abdominal lymph nodes: Secondary | ICD-10-CM

## 2020-08-14 DIAGNOSIS — F418 Other specified anxiety disorders: Secondary | ICD-10-CM | POA: Diagnosis not present

## 2020-08-14 DIAGNOSIS — Z7982 Long term (current) use of aspirin: Secondary | ICD-10-CM | POA: Diagnosis not present

## 2020-08-14 DIAGNOSIS — E785 Hyperlipidemia, unspecified: Secondary | ICD-10-CM | POA: Diagnosis not present

## 2020-08-14 DIAGNOSIS — C8288 Other types of follicular lymphoma, lymph nodes of multiple sites: Secondary | ICD-10-CM | POA: Diagnosis not present

## 2020-08-14 DIAGNOSIS — Z5112 Encounter for antineoplastic immunotherapy: Secondary | ICD-10-CM | POA: Diagnosis not present

## 2020-08-14 LAB — CMP (CANCER CENTER ONLY)
ALT: 37 U/L (ref 0–44)
AST: 21 U/L (ref 15–41)
Albumin: 3.2 g/dL — ABNORMAL LOW (ref 3.5–5.0)
Alkaline Phosphatase: 99 U/L (ref 38–126)
Anion gap: 9 (ref 5–15)
BUN: 8 mg/dL (ref 8–23)
CO2: 26 mmol/L (ref 22–32)
Calcium: 8.9 mg/dL (ref 8.9–10.3)
Chloride: 108 mmol/L (ref 98–111)
Creatinine: 0.76 mg/dL (ref 0.44–1.00)
GFR, Estimated: >60 mL/min (ref >60)
Glucose, Bld: 85 mg/dL (ref 70–99)
Potassium: 3.8 mmol/L (ref 3.5–5.1)
Sodium: 143 mmol/L (ref 135–145)
Total Bilirubin: 0.6 mg/dL (ref 0.3–1.2)
Total Protein: 6 g/dL — ABNORMAL LOW (ref 6.5–8.1)

## 2020-08-14 LAB — LACTATE DEHYDROGENASE: LDH: 197 U/L — ABNORMAL HIGH (ref 98–192)

## 2020-08-14 LAB — CBC WITH DIFFERENTIAL (CANCER CENTER ONLY)
Abs Immature Granulocytes: 0.16 10*3/uL — ABNORMAL HIGH (ref 0.00–0.07)
Basophils Absolute: 0.1 10*3/uL (ref 0.0–0.1)
Basophils Relative: 2 %
Eosinophils Absolute: 0.1 10*3/uL (ref 0.0–0.5)
Eosinophils Relative: 2 %
HCT: 41 % (ref 36.0–46.0)
Hemoglobin: 13.8 g/dL (ref 12.0–15.0)
Immature Granulocytes: 4 %
Lymphocytes Relative: 13 %
Lymphs Abs: 0.5 10*3/uL — ABNORMAL LOW (ref 0.7–4.0)
MCH: 31.8 pg (ref 26.0–34.0)
MCHC: 33.7 g/dL (ref 30.0–36.0)
MCV: 94.5 fL (ref 80.0–100.0)
Monocytes Absolute: 0.3 10*3/uL (ref 0.1–1.0)
Monocytes Relative: 8 %
Neutro Abs: 2.8 10*3/uL (ref 1.7–7.7)
Neutrophils Relative %: 71 %
Platelet Count: 163 10*3/uL (ref 150–400)
RBC: 4.34 MIL/uL (ref 3.87–5.11)
RDW: 14.9 % (ref 11.5–15.5)
WBC Count: 3.9 10*3/uL — ABNORMAL LOW (ref 4.0–10.5)
nRBC: 0 % (ref 0.0–0.2)

## 2020-08-15 ENCOUNTER — Inpatient Hospital Stay: Payer: Medicare Other

## 2020-08-15 VITALS — BP 108/66 | HR 70 | Temp 98.5°F | Resp 17

## 2020-08-15 DIAGNOSIS — E559 Vitamin D deficiency, unspecified: Secondary | ICD-10-CM | POA: Diagnosis not present

## 2020-08-15 DIAGNOSIS — F418 Other specified anxiety disorders: Secondary | ICD-10-CM | POA: Diagnosis not present

## 2020-08-15 DIAGNOSIS — E785 Hyperlipidemia, unspecified: Secondary | ICD-10-CM | POA: Diagnosis not present

## 2020-08-15 DIAGNOSIS — Z7982 Long term (current) use of aspirin: Secondary | ICD-10-CM | POA: Diagnosis not present

## 2020-08-15 DIAGNOSIS — C8253 Diffuse follicle center lymphoma, intra-abdominal lymph nodes: Secondary | ICD-10-CM

## 2020-08-15 DIAGNOSIS — C8288 Other types of follicular lymphoma, lymph nodes of multiple sites: Secondary | ICD-10-CM | POA: Diagnosis not present

## 2020-08-15 DIAGNOSIS — Z5112 Encounter for antineoplastic immunotherapy: Secondary | ICD-10-CM | POA: Diagnosis not present

## 2020-08-15 MED ORDER — ACETAMINOPHEN 325 MG PO TABS
ORAL_TABLET | ORAL | Status: AC
  Filled 2020-08-15: qty 2

## 2020-08-15 MED ORDER — ACETAMINOPHEN 325 MG PO TABS
650.0000 mg | ORAL_TABLET | Freq: Once | ORAL | Status: AC
  Administered 2020-08-15: 650 mg via ORAL

## 2020-08-15 MED ORDER — SODIUM CHLORIDE 0.9 % IV SOLN: 375.0000 mg/m2 | Freq: Once | INTRAVENOUS | Status: DC

## 2020-08-15 MED ORDER — SODIUM CHLORIDE 0.9 % IV SOLN
375.0000 mg/m2 | Freq: Once | INTRAVENOUS | Status: AC
  Administered 2020-08-15: 700 mg via INTRAVENOUS
  Filled 2020-08-15: qty 20

## 2020-08-15 MED ORDER — DIPHENHYDRAMINE HCL 25 MG PO CAPS
50.0000 mg | ORAL_CAPSULE | Freq: Once | ORAL | Status: AC
  Administered 2020-08-15: 50 mg via ORAL

## 2020-08-15 MED ORDER — DIPHENHYDRAMINE HCL 25 MG PO CAPS
ORAL_CAPSULE | ORAL | Status: AC
  Filled 2020-08-15: qty 2

## 2020-08-15 MED ORDER — HEPARIN SOD (PORK) LOCK FLUSH 100 UNIT/ML IV SOLN
500.0000 [IU] | Freq: Once | INTRAVENOUS | Status: AC | PRN
  Administered 2020-08-15: 500 [IU]
  Filled 2020-08-15: qty 5

## 2020-08-15 MED ORDER — SODIUM CHLORIDE 0.9 % IV SOLN
Freq: Once | INTRAVENOUS | Status: AC
  Filled 2020-08-15: qty 250

## 2020-08-15 MED ORDER — SODIUM CHLORIDE 0.9% FLUSH
10.0000 mL | INTRAVENOUS | Status: DC | PRN
  Administered 2020-08-15: 10 mL
  Filled 2020-08-15: qty 10

## 2020-08-15 NOTE — Progress Notes (Signed)
VSS, pt stable at discharge

## 2020-08-15 NOTE — Progress Notes (Signed)
.   Rapid Infusion Rituximab Pharmacist Evaluation  Alyssa Elliott is a 69 y.o. female being treated with rituximab for NHL. This patient may be considered for RIR.   A pharmacist has verified the patient tolerated rituximab infusions per the [facility] standard infusion protocol without grade 3-4 infusion reactions. The treatment plan will be updated to reflect RIR if the patient qualifies per the checklist below:   Age > 73 years old Yes   Clinically significant cardiovascular disease No   Circulating lymphocyte count < 5000/uL prior to cycle two Yes  Lab Results  Component Value Date   LYMPHSABS 0.5 (L) 08/14/2020    Prior documented grade 3-4 infusion reaction to rituximab No   Prior documented grade 1-2 infusion reaction to rituximab (If YES, Pharmacist will confirm with Physician if patient is still a candidate for RIR) No   Previous rituximab infusion within the past 6 months Yes   Treatment Plan updated orders to reflect RIR Yes    Alyssa Elliott does meet the criteria for Rapid Infusion Rituximab. This patient is going to be switched to rapid infusion rituximab.   Alyssa Elliott 08/15/20 8:32 AM

## 2020-08-15 NOTE — Patient Instructions (Signed)
[facility] Discharge Instructions for Patients Receiving Chemotherapy  Today you received the following chemotherapy agents:  Rituxan.  To help prevent nausea and vomiting after your treatment, we encourage you to take your nausea medication as directed.   If you develop nausea and vomiting that is not controlled by your nausea medication, call the clinic.   BELOW ARE SYMPTOMS THAT SHOULD BE REPORTED IMMEDIATELY:  *FEVER GREATER THAN 100.5 F  *CHILLS WITH OR WITHOUT FEVER  NAUSEA AND VOMITING THAT IS NOT CONTROLLED WITH YOUR NAUSEA MEDICATION  *UNUSUAL SHORTNESS OF BREATH  *UNUSUAL BRUISING OR BLEEDING  TENDERNESS IN MOUTH AND THROAT WITH OR WITHOUT PRESENCE OF ULCERS  *URINARY PROBLEMS  *BOWEL PROBLEMS  UNUSUAL RASH Items with * indicate a potential emergency and should be followed up as soon as possible.  Feel free to call the clinic should you have any questions or concerns. The clinic phone number is [phone].  Please show the CHEMO ALERT CARD at check-in to the Emergency Department and triage nurse.   

## 2020-08-18 ENCOUNTER — Encounter: Payer: Medicare (Managed Care) | Attending: Specialist | Primary: Family

## 2020-08-18 ENCOUNTER — Telehealth: Payer: Self-pay

## 2020-08-18 NOTE — Telephone Encounter (Signed)
Alyssa Elliott called about her infusion appointment this week.  She did not know when it was.  I called her back and let her know it was in [facility].  I gave her the address and the phone number to the [place] at [facility].  She lives in [place] and knows where this is.  Making Hilario Quarry aware as well so she knows the patient has been called and given directions. Gardiner Rhyme, RN

## 2020-08-18 NOTE — Progress Notes (Signed)
Alyssa Elliott was seen in phlebotomy when she had a vagal response when having her blood drawn.  She reported that she had had no breakfast and has a fear of needles and has had vagal responses when having her blood drawn in the past.  Her blood pressure initially was 62/51 with a pulse of 61, temperature 97.4 and oxygen saturation at 98% on room air.  She was given orange juice and crackers.  Her blood pressure eventually rebounded and she was released home without any additional issues of concern.  This was discussed with Dr. Narda Rutherford.  Sandi Mealy, MHS, PA-C Physician Assistant

## 2020-08-19 ENCOUNTER — Telehealth: Payer: Self-pay | Admitting: Hematology and Oncology

## 2020-08-19 ENCOUNTER — Other Ambulatory Visit: Payer: Self-pay | Admitting: Hematology and Oncology

## 2020-08-19 DIAGNOSIS — C8253 Diffuse follicle center lymphoma, intra-abdominal lymph nodes: Secondary | ICD-10-CM

## 2020-08-19 NOTE — Telephone Encounter (Signed)
Called and spoke with patient. Confirmed appts on 12/2 at [facility] and 12/10 here.

## 2020-08-20 ENCOUNTER — Inpatient Hospital Stay: Payer: Medicare Other

## 2020-08-20 ENCOUNTER — Other Ambulatory Visit: Payer: Self-pay | Admitting: Hematology and Oncology

## 2020-08-20 ENCOUNTER — Other Ambulatory Visit: Payer: Self-pay

## 2020-08-20 ENCOUNTER — Inpatient Hospital Stay: Payer: Medicare Other | Attending: Hematology and Oncology

## 2020-08-20 VITALS — BP 104/64 | HR 72 | Temp 98.6°F | Resp 17

## 2020-08-20 DIAGNOSIS — R252 Cramp and spasm: Secondary | ICD-10-CM | POA: Insufficient documentation

## 2020-08-20 DIAGNOSIS — R5383 Other fatigue: Secondary | ICD-10-CM | POA: Diagnosis not present

## 2020-08-20 DIAGNOSIS — K654 Sclerosing mesenteritis: Secondary | ICD-10-CM | POA: Diagnosis not present

## 2020-08-20 DIAGNOSIS — Z5111 Encounter for antineoplastic chemotherapy: Secondary | ICD-10-CM | POA: Insufficient documentation

## 2020-08-20 DIAGNOSIS — Z79899 Other long term (current) drug therapy: Secondary | ICD-10-CM | POA: Diagnosis not present

## 2020-08-20 DIAGNOSIS — C8288 Other types of follicular lymphoma, lymph nodes of multiple sites: Secondary | ICD-10-CM | POA: Insufficient documentation

## 2020-08-20 DIAGNOSIS — C8253 Diffuse follicle center lymphoma, intra-abdominal lymph nodes: Secondary | ICD-10-CM

## 2020-08-20 LAB — CBC WITH DIFFERENTIAL (CANCER CENTER ONLY)
Abs Immature Granulocytes: 0.22 10*3/uL — ABNORMAL HIGH (ref 0.00–0.07)
Basophils Absolute: 0.1 10*3/uL (ref 0.0–0.1)
Basophils Relative: 1 %
Eosinophils Absolute: 0.1 10*3/uL (ref 0.0–0.5)
Eosinophils Relative: 1 %
HCT: 38.8 % (ref 36.0–46.0)
Hemoglobin: 12.8 g/dL (ref 12.0–15.0)
Immature Granulocytes: 4 %
Lymphocytes Relative: 24 %
Lymphs Abs: 1.2 10*3/uL (ref 0.7–4.0)
MCH: 31.7 pg (ref 26.0–34.0)
MCHC: 33 g/dL (ref 30.0–36.0)
MCV: 96 fL (ref 80.0–100.0)
Monocytes Absolute: 0.5 10*3/uL (ref 0.1–1.0)
Monocytes Relative: 9 %
Neutro Abs: 3.2 10*3/uL (ref 1.7–7.7)
Neutrophils Relative %: 61 %
Platelet Count: 175 10*3/uL (ref 150–400)
RBC: 4.04 MIL/uL (ref 3.87–5.11)
RDW: 15 % (ref 11.5–15.5)
WBC Count: 5.3 10*3/uL (ref 4.0–10.5)
nRBC: 0 % (ref 0.0–0.2)

## 2020-08-20 LAB — CMP (CANCER CENTER ONLY)
ALT: 25 U/L (ref 0–44)
AST: 20 U/L (ref 15–41)
Albumin: 3.6 g/dL (ref 3.5–5.0)
Alkaline Phosphatase: 85 U/L (ref 38–126)
Anion gap: 5 (ref 5–15)
BUN: 11 mg/dL (ref 8–23)
CO2: 30 mmol/L (ref 22–32)
Calcium: 8.9 mg/dL (ref 8.9–10.3)
Chloride: 106 mmol/L (ref 98–111)
Creatinine: 0.77 mg/dL (ref 0.44–1.00)
GFR, Estimated: >60 mL/min (ref >60)
Glucose, Bld: 83 mg/dL (ref 70–99)
Potassium: 3.7 mmol/L (ref 3.5–5.1)
Sodium: 141 mmol/L (ref 135–145)
Total Bilirubin: 0.5 mg/dL (ref 0.3–1.2)
Total Protein: 5.6 g/dL — ABNORMAL LOW (ref 6.5–8.1)

## 2020-08-20 LAB — LACTATE DEHYDROGENASE: LDH: 186 U/L (ref 98–192)

## 2020-08-20 MED ORDER — ACETAMINOPHEN 325 MG PO TABS
650.0000 mg | ORAL_TABLET | Freq: Once | ORAL | Status: AC
  Administered 2020-08-20: 650 mg via ORAL

## 2020-08-20 MED ORDER — DIPHENHYDRAMINE HCL 25 MG PO CAPS
50.0000 mg | ORAL_CAPSULE | Freq: Once | ORAL | Status: AC
  Administered 2020-08-20: 50 mg via ORAL

## 2020-08-20 MED ORDER — SODIUM CHLORIDE 0.9% FLUSH
10.0000 mL | INTRAVENOUS | Status: DC | PRN
  Administered 2020-08-20: 10 mL
  Filled 2020-08-20: qty 10

## 2020-08-20 MED ORDER — HEPARIN SOD (PORK) LOCK FLUSH 100 UNIT/ML IV SOLN
500.0000 [IU] | Freq: Once | INTRAVENOUS | Status: AC | PRN
  Administered 2020-08-20: 500 [IU]
  Filled 2020-08-20: qty 5

## 2020-08-20 MED ORDER — ACETAMINOPHEN 325 MG PO TABS
ORAL_TABLET | ORAL | Status: AC
  Filled 2020-08-20: qty 2

## 2020-08-20 MED ORDER — DIPHENHYDRAMINE HCL 25 MG PO CAPS
ORAL_CAPSULE | ORAL | Status: AC
  Filled 2020-08-20: qty 2

## 2020-08-20 MED ORDER — SODIUM CHLORIDE 0.9 % IV SOLN
Freq: Once | INTRAVENOUS | Status: AC
  Filled 2020-08-20: qty 250

## 2020-08-20 MED ORDER — SODIUM CHLORIDE 0.9 % IV SOLN
375.0000 mg/m2 | Freq: Once | INTRAVENOUS | Status: AC
  Administered 2020-08-20: 700 mg via INTRAVENOUS
  Filled 2020-08-20: qty 20

## 2020-08-20 NOTE — Patient Instructions (Signed)

## 2020-08-20 NOTE — Patient Instructions (Signed)
[facility]charge [facility] for Patients Receiving Chemotherapy  Today you received the following chemotherapy agents:  Rituxan.  To help prevent nausea and vomiting after your treatment, we encourage you to take your nausea medication as directed.   If you develop nausea and vomiting that is not controlled by your nausea medication, call the clinic.   BELOW ARE SYMPTOMS THAT SHOULD BE REPORTED IMMEDIATELY:  *FEVER GREATER THAN 100.5 F  *CHILLS WITH OR WITHOUT FEVER  NAUSEA AND VOMITING THAT IS NOT CONTROLLED WITH YOUR NAUSEA MEDICATION  *UNUSUAL SHORTNESS OF BREATH  *UNUSUAL BRUISING OR BLEEDING  TENDERNESS IN MOUTH AND THROAT WITH OR WITHOUT PRESENCE OF ULCERS  *URINARY PROBLEMS  *BOWEL PROBLEMS  UNUSUAL RASH Items with * indicate a potential emergency and should be followed up as soon as possible.  Feel free to call the clinic should you have any questions or concerns. The clinic phone number is [phone].  Please show the CHEMO ALERT CARD at check-in to the Emergency Department and triage nurse.   

## 2020-08-20 NOTE — Progress Notes (Signed)
Pt discharged in no apparent distress. Pt left ambulatory without assistance. Pt aware of discharge instructions and verbalized understanding and had no further questions.  

## 2020-08-21 ENCOUNTER — Ambulatory Visit: Admit: 2020-08-21 | Discharge: 2020-08-21 | Payer: MEDICARE | Attending: Family | Primary: Family

## 2020-08-21 ENCOUNTER — Ambulatory Visit: Attending: Family | Primary: Family

## 2020-08-21 DIAGNOSIS — Z0000 Encounter for general adult medical examination without abnormal findings: Secondary | ICD-10-CM

## 2020-08-21 LAB — AMB POC HEMOGLOBIN A1C
Hemoglobin A1C, POC: 5.7 % — AB (ref 4.8–5.6)
Hemoglobin A1c (POC): 5.7 % — AB (ref 4.8–5.6)

## 2020-08-21 MED ORDER — VALSARTAN 160 MG TAB
160 mg | ORAL_TABLET | Freq: Every day | ORAL | 3 refills | Status: DC
Start: 2020-08-21 — End: 2020-11-19

## 2020-08-21 MED ORDER — GABAPENTIN 300 MG CAP
300 mg | ORAL_CAPSULE | Freq: Every evening | ORAL | 3 refills | Status: DC
Start: 2020-08-21 — End: 2020-11-19

## 2020-08-21 NOTE — Progress Notes (Signed)
Subjective  Lindsey Huynh is a 69 y.o. female presents for routine visit. Time allows for MWV  HPI       Past Medical History:   Diagnosis Date   ??? Diabetes ([place])     insulin + metformin   ??? GERD (gastroesophageal reflux disease)    ??? Hypercholesterolemia    ??? Hypertension    ??? PAD (peripheral artery disease) (HCC)        Current Outpatient Medications   Medication Sig   ??? valsartan (DIOVAN) 160 mg tablet Take 1 Tablet by mouth daily.   ??? gabapentin (NEURONTIN) 300 mg capsule Take 1 Capsule by mouth nightly. Max Daily Amount: 300 mg.   ??? metFORMIN ER (GLUCOPHAGE XR) 500 mg tablet TAKE 2 TABLETS BY MOUTH DAILY WITH DINNER   ??? amLODIPine (NORVASC) 10 mg tablet Take 1 Tablet by mouth daily.   ??? carvediloL (COREG) 3.125 mg tablet Take 1 Tablet by mouth two (2) times daily (with meals).   ??? acetaminophen (TYLENOL) 325 mg tablet Take 2 Tabs by mouth every six (6) hours as needed for Pain.   ??? insulin detemir U-100 (Levemir FlexTouch U-100 Insuln) 100 unit/mL (3 mL) inpn 12 Units by SubCUTAneous route daily (with breakfast).   ??? Insulin Needles, Disposable, (Nano Pen Needle) 32 gauge x 5/32" ndle Use with insulin pen 3 times daily   ??? cyclopentolate (CYCLOGYL) 1 % ophthalmic solution    ??? aspirin delayed-release 81 mg tablet Take 81 mg by mouth daily.   ??? omeprazole (PRILOSEC) 20 mg capsule Take 20 mg by mouth daily.   ??? atorvastatin (LIPITOR) 20 mg tablet Take 1 Tablet by mouth daily.   ??? diclofenac (VOLTAREN) 1 % gel Apply 4 g to affected area four (4) times daily as needed for Pain. (Patient not taking: Reported on 08/21/2020)     No current facility-administered medications for this visit.       Allergies   Allergen Reactions   ??? Neuromuscular Blockers, Steroidal Other (comments)     GI Upset       Past Surgical History:   Procedure Laterality Date   ??? 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 Duwaine Maxin   ??? HX COLONOSCOPY     ??? HX FEMORAL BYPASS Left 11/28/2019     Dr Timmothy Sours    ??? HX ORTHOPAEDIC  06-30-10    back surgery (tumor removed)   ??? HX TUMOR REMOVAL  06/30/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     History reviewed. No pertinent family history.  Out of Valsartan since  last week. Unsure if she should be taking  2 or 3 BP medications   Takes diabetes medication as prescribed   No BS or BP monitoring   Doing better managing diabetes   Denies symptoms of low or high BS  Left inner thigh still painful  s/p  fem popliteal  bypass November 28, 2019. 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 She likes location more opportunities for  social activities   Non smoker   Rare alcohol  Denies depression   Situational anxiety  Daughter supportive                     Review of Systems   Constitutional: Negative.    HENT: Negative for hearing loss.    Eyes: Positive for blurred vision.   Respiratory: Negative.    Cardiovascular: Negative.  Negative for chest pain, palpitations and leg swelling.   Gastrointestinal: Negative.    Genitourinary: Negative.    Musculoskeletal: Positive for joint pain and myalgias. Negative for falls.   Skin: Negative.    Neurological: Negative for dizziness and headaches.   Psychiatric/Behavioral: The patient is nervous/anxious. The patient does not have insomnia.        Objective  Physical Exam  Constitutional:       Appearance: Normal appearance.   Cardiovascular:      Rate and Rhythm: Normal rate and regular rhythm.      Pulses:           Dorsalis pedis pulses are 1+ on the right side and 0 on the left side.      Heart sounds:  Normal heart sounds.   Pulmonary:      Effort: Pulmonary effort is normal.      Breath sounds: Normal breath sounds.   Abdominal:      General: Bowel sounds are normal.      Palpations: Abdomen is soft.      Tenderness: There is no abdominal tenderness. There is no left CVA tenderness.   Musculoskeletal:      Right lower leg: No edema.      Left lower leg: No edema.      Right foot: Normal range of motion.      Left foot: Decreased range of motion.      Comments: Gait stable, uses cane       Left Lower Extremity: (forefoot amputaton, healed surgial site )  Feet:      Right foot:      Skin integrity: No ulcer, callus or dry skin.      Left foot:      Skin integrity: Dry skin present. No ulcer or callus.   Skin:     General: Skin is warm and dry.      Findings: No rash.   Neurological:      Mental Status: She is alert and oriented to person, place, and time. Mental status is at baseline.      Gait: Gait abnormal (antalgic ).   Psychiatric:         Mood and Affect: Mood normal.         Behavior: Behavior normal.         Thought Content: Thought content normal.          Assessment & Plan    ICD-10-CM ICD-9-CM    1. 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 Diabetic polyneuropathy associated with type  2 diabetes mellitus (HCC)  E11.42 250.60 gabapentin (NEURONTIN) 300 mg capsule     357.2 CBC WITH AUTOMATED DIFF      CBC WITH AUTOMATED DIFF   4. Essential hypertension  I10 401.9 valsartan (DIOVAN) 160 mg tablet   5. PAD (peripheral artery disease) (HCC)  I73.9 443.9    6. Hypercholesterolemia  E78.00 272.0 LIPID PANEL      LIPID PANEL   7. Advance directive discussed with patient  Z71.89 V65.49    8.  Encounter for screening mammogram for malignant neoplasm of breast  Z12.31 V76.12 MAM MAMMO BI SCREENING INCL CAD     Follow-up and Dispositions    ?? Return in about 3 months (around 11/19/2020) for htn.       Diabetes controlled. Hemoglobin A1c 5.7%    Blood pressure elevated.like d/t missed medications. Medications reviewed. Encouraged BP monitoring, lifestyle management     Encouraged follow up with specialists     reviewed diet, exercise and weight control        cardiovascular risk and specific lipid/LDL goals reviewed  reviewed medications and side effects in detail  Karsten Fells, NP   This is the Subsequent Medicare Annual Wellness Exam, performed 12 months or more after the Initial AWV or the last Subsequent AWV    I have reviewed the patient's medical history in detail and updated the computerized patient record.       Assessment/Plan   Education and counseling provided:  Are appropriate based on today's review and evaluation  Influenza Vaccine  Colorectal cancer screening tests   She plans to check medical records to verify timing of next colonoscopy  Declines flu vaccine today  Encouraged Shingrix vaccine     1. 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 Diabetic polyneuropathy associated with type 2 diabetes mellitus (HCC)  -     gabapentin (NEURONTIN) 300 mg capsule; Take 1 Capsule by mouth nightly. Max Daily Amount: 300 mg., Normal, Disp-30 Capsule, R-3  -     CBC WITH AUTOMATED DIFF; Future  4. Essential hypertension  -     valsartan (DIOVAN) 160 mg tablet; Take 1 Tablet by mouth daily., Normal, Disp-90 Tablet, R-3  5. PAD (peripheral artery disease) ([place])  6. Hypercholesterolemia  -     LIPID PANEL; Future  7. Advance directive  discussed with patient  8. Encounter for screening mammogram for malignant neoplasm of breast  -     MAM MAMMO BI SCREENING INCL CAD; Future       Depression Risk Factor Screening     3 most recent PHQ Screens 08/21/2020   Little interest or pleasure in doing things Not at all   Feeling down, depressed, irritable, or hopeless Not at all   Total Score PHQ 2 0       Alcohol Risk Screen    Do you average more than 1 drink per night or more than 7 drinks a week:  No    On any one occasion in the past three months have you have had more than 3 drinks containing alcohol:  No  Functional Ability and Level of Safety    Hearing: Hearing is good.      Activities of Daily Living:  The home contains: no safety equipment.  Patient needs help with:  transportation and walking      Ambulation: with difficulty, uses a cane     Fall Risk:  Fall Risk Assessment, last 12 mths 04/21/2020   Able to walk? Yes   Fall in past 12 months? 0   Do you feel unsteady? 0   Are you worried about falling 0   Number of falls in past 12 months -   Fall with injury? -      Abuse Screen:  Patient is not abused       Cognitive Screening    Has your family/caregiver stated any concerns about your memory: no     Cognitive Screening: normal observation and conversation     Health Maintenance Due     Health Maintenance Due   Topic Date Due   ??? Eye Exam Retinal or Dilated  Never done   ??? DTaP/Tdap/Td series (1 - Tdap) Never done   ??? Colorectal Cancer Screening Combo  Never done   ??? Shingrix Vaccine Age 37> (1 of 2) Never done   ??? Breast Cancer Screen Mammogram  03/03/2016   ??? MICROALBUMIN Q1  02/24/2019       Patient Care Team   Patient Care Team:  Karsten Fells, NP as PCP - General (Family Medicine)  Karsten Fells, NP as PCP - [facility] Empaneled Provider  Tresa Endo, MD (Orthopedic Surgery)    History     Patient Active Problem List   Diagnosis Code   ??? HTN (hypertension) I10   ??? GERD (gastroesophageal reflux disease) K21.9   ??? Hypercholesterolemia  E78.00   ??? Diabetes ([place]) E11.9   ??? Constipation K59.00   ??? Postmenopausal Z78.0   ??? 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 Cataract of left eye H26.9   ??? 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 Type 2 diabetes mellitus with diabetic neuropathy (HCC) E11.40   ??? Gangrene ([place]) I96     Past Medical History:   Diagnosis Date   ??? Diabetes ([place])     insulin + metformin   ??? GERD (gastroesophageal reflux disease)    ??? Hypercholesterolemia    ??? Hypertension    ??? PAD (peripheral artery disease) [facility])       Past Surgical History:   Procedure Laterality Date   ??? 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 Duwaine Maxin   ??? HX COLONOSCOPY     ??? HX FEMORAL BYPASS Left 11/28/2019     Dr Timmothy Sours   ??? HX ORTHOPAEDIC  06-30-10    back surgery (tumor removed)   ??? HX TUMOR REMOVAL  06/30/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     Current Outpatient Medications   Medication Sig Dispense Refill   ??? valsartan (DIOVAN) 160 mg tablet Take 1 Tablet by mouth daily. 90 Tablet 3   ??? gabapentin (NEURONTIN) 300 mg capsule Take 1 Capsule by mouth nightly. Max Daily Amount: 300 mg. 30 Capsule 3   ??? metFORMIN ER (GLUCOPHAGE XR) 500 mg tablet TAKE 2 TABLETS BY MOUTH DAILY WITH DINNER 60 Tablet 0   ??? amLODIPine (NORVASC) 10 mg tablet Take 1 Tablet by mouth daily. 30 Tablet 0   ??? carvediloL (COREG) 3.125 mg tablet Take 1 Tablet by  mouth two (2) times daily (with meals). 60 Tablet 5   ??? acetaminophen (TYLENOL) 325 mg tablet Take 2 Tabs by mouth every six (6) hours as needed for Pain. 10 Tab 0   ??? insulin detemir U-100 (Levemir FlexTouch U-100 Insuln) 100 unit/mL (3 mL) inpn 12 Units by SubCUTAneous route daily (with breakfast). 5 Adjustable Dose Pre-filled Pen Syringe 0   ??? Insulin Needles, Disposable, (Nano Pen Needle) 32 gauge x 5/32" ndle Use with insulin pen 3 times daily 100 Pen Needle 0   ??? cyclopentolate (CYCLOGYL) 1 % ophthalmic solution      ??? aspirin delayed-release 81 mg tablet Take 81 mg by mouth daily.     ??? omeprazole  (PRILOSEC) 20 mg capsule Take 20 mg by mouth daily.     ??? atorvastatin (LIPITOR) 20 mg tablet Take 1 Tablet by mouth daily. 90 Tablet 3   ??? diclofenac (VOLTAREN) 1 % gel Apply 4 g to affected area four (4) times daily as needed for Pain. (Patient not taking: Reported on 08/21/2020) 150 g 1     Allergies   Allergen Reactions   ??? Neuromuscular Blockers, Steroidal Other (comments)     GI Upset       History reviewed. No pertinent family history.  Social History     Tobacco Use   ??? Smoking status: Former Smoker     Years: 10.00   ??? Smokeless tobacco: Never Used   Substance Use Topics   ??? Alcohol use: Yes     Alcohol/week: 5.0 standard drinks     Types: 6 Cans of beer per week         Karsten Fells, NP

## 2020-08-21 NOTE — Progress Notes (Signed)
Progress  Notes by Laurena Bering at 08/21/20 1030                Author: Laurena Bering  Service: --  Author Type: Licensed Nurse       Filed: 08/21/20 1025  Encounter Date: 08/21/2020  Status: Signed          Editor: Laurena Bering (Licensed Nurse)                Rm 7   Recent Travel Screening and Travel History documentation           Travel Screening             Question     Response            In the last month, have you been in contact with someone who was confirmed or suspected to have Coronavirus / COVID-19?    No / Unsure       Have you had a COVID-19 viral test in the last 14 days?    No       Do you have any of the following new or worsening symptoms?    None of these            Have you traveled internationally or domestically in the last month?    No             Travel History    Travel since 07/22/20      No documented travel since 07/22/20                  Chief Complaint       Patient presents with        ?  Hypertension     ?  Diabetes     ?  Medication Evaluation             pt wants to sure she is supposed to be taking 3 BP medications              1. Have you been to the ER, urgent care clinic since your last visit?  Hospitalized since your last visit?No      2. Have you seen or consulted any other health care providers outside of the [facility] since your last visit?  Include any pap smears or colon screening. No              Health Maintenance Due        Topic  Date Due         ?  Eye Exam Retinal or Dilated   Never done     ?  DTaP/Tdap/Td series (1 - Tdap)  Never done     ?  Colorectal Cancer Screening Combo   Never done     ?  Shingrix Vaccine Age 32> (1 of 2)  Never done     ?  Breast Cancer Screen Mammogram   03/03/2016     ?  Medicare Yearly Exam   02/24/2018     ?  MICROALBUMIN Q1   02/24/2019     ?  Lipid Screen   10/31/2019         ?  Flu Vaccine (1)  Never done                    3 most recent PHQ Screens  08/21/2020        Little interest or pleasure  in doing things  Not at all     Feeling down, depressed, irritable, or hopeless  Not at all        Total Score PHQ 2  0           Fall Risk Assessment, last 12 mths  04/21/2020        Able to walk?  Yes     Fall in past 12 months?  0     Do you feel unsteady?  0     Are you worried about falling  0     Number of falls in past 12 months  -        Fall with injury?  -                    Learning Assessment  02/23/2017        PRIMARY LEARNER  Patient     HIGHEST LEVEL OF EDUCATION - PRIMARY LEARNER   2 YEARS OF COLLEGE     PRIMARY LANGUAGE  ENGLISH     LEARNER PREFERENCE PRIMARY  DEMONSTRATION     ANSWERED BY  Lyn Hollingshead        RELATIONSHIP  SELF              An After Visit Summary was printed and given to the patient.

## 2020-08-22 DIAGNOSIS — Z23 Encounter for immunization: Secondary | ICD-10-CM | POA: Diagnosis not present

## 2020-08-22 LAB — CBC WITH AUTOMATED DIFF
ABS. BASOPHILS: 0 10*3/uL (ref 0.0–0.1)
ABS. EOSINOPHILS: 0.2 10*3/uL (ref 0.0–0.4)
ABS. IMM. GRANS.: 0 10*3/uL (ref 0.00–0.04)
ABS. LYMPHOCYTES: 1.6 10*3/uL (ref 0.8–3.5)
ABS. MONOCYTES: 0.6 10*3/uL (ref 0.0–1.0)
ABS. NEUTROPHILS: 4.7 10*3/uL (ref 1.8–8.0)
ABSOLUTE NRBC: 0 10*3/uL (ref 0.00–0.01)
BASOPHILS: 0 % (ref 0–1)
EOSINOPHILS: 2 % (ref 0–7)
HCT: 39.1 % (ref 35.0–47.0)
HGB: 12.6 g/dL (ref 11.5–16.0)
IMMATURE GRANULOCYTES: 0 % (ref 0.0–0.5)
LYMPHOCYTES: 22 % (ref 12–49)
MCH: 32 PG (ref 26.0–34.0)
MCHC: 32.2 g/dL (ref 30.0–36.5)
MCV: 99.2 FL — ABNORMAL HIGH (ref 80.0–99.0)
MONOCYTES: 9 % (ref 5–13)
MPV: 10.7 FL (ref 8.9–12.9)
NEUTROPHILS: 67 % (ref 32–75)
NRBC: 0 PER 100 WBC
PLATELET: 275 10*3/uL (ref 150–400)
RBC: 3.94 M/uL (ref 3.80–5.20)
RDW: 12.7 % (ref 11.5–14.5)
WBC: 7.2 10*3/uL (ref 3.6–11.0)

## 2020-08-22 LAB — METABOLIC PANEL, COMPREHENSIVE
A-G Ratio: 1.1 (ref 1.1–2.2)
ALT (SGPT): 30 U/L (ref 12–78)
AST (SGOT): 40 U/L — ABNORMAL HIGH (ref 15–37)
Albumin: 3.9 g/dL (ref 3.5–5.0)
Alk. phosphatase: 134 U/L — ABNORMAL HIGH (ref 45–117)
Anion gap: 8 mmol/L (ref 5–15)
BUN/Creatinine ratio: 22 — ABNORMAL HIGH (ref 12–20)
BUN: 23 MG/DL — ABNORMAL HIGH (ref 6–20)
Bilirubin, total: 0.3 MG/DL (ref 0.2–1.0)
CO2: 27 mmol/L (ref 21–32)
Calcium: 10.6 MG/DL — ABNORMAL HIGH (ref 8.5–10.1)
Chloride: 103 mmol/L (ref 97–108)
Creatinine: 1.03 MG/DL — ABNORMAL HIGH (ref 0.55–1.02)
GFR est AA: >60 mL/min/{1.73_m2} (ref >60)
GFR est non-AA: 53 mL/min/{1.73_m2} — ABNORMAL LOW (ref >60)
Globulin: 3.6 g/dL (ref 2.0–4.0)
Glucose: 198 mg/dL — ABNORMAL HIGH (ref 65–100)
Potassium: 3.8 mmol/L (ref 3.5–5.1)
Protein, total: 7.5 g/dL (ref 6.4–8.2)
Sodium: 138 mmol/L (ref 136–145)

## 2020-08-22 LAB — LIPID PANEL
CHOL/HDL Ratio: 3.5 (ref 0.0–5.0)
Chol/HDL Ratio: 3.5 (ref 0.0–5.0)
Cholesterol, Total: 253 MG/DL — ABNORMAL HIGH (ref <200)
Cholesterol, total: 253 MG/DL — ABNORMAL HIGH (ref <200)
HDL Cholesterol: 72 MG/DL
HDL: 72 MG/DL
LDL Calculated: 157.6 MG/DL — ABNORMAL HIGH (ref 0–100)
LDL, calculated: 157.6 MG/DL — ABNORMAL HIGH (ref 0–100)
Triglyceride: 117 MG/DL (ref <150)
Triglycerides: 117 MG/DL (ref <150)
VLDL Cholesterol Calculated: 23.4 MG/DL
VLDL, calculated: 23.4 MG/DL

## 2020-08-22 LAB — COMPREHENSIVE METABOLIC PANEL
ALT: 30 U/L (ref 12–78)
AST: 40 U/L — ABNORMAL HIGH (ref 15–37)
Albumin/Globulin Ratio: 1.1 (ref 1.1–2.2)
Albumin: 3.9 g/dL (ref 3.5–5.0)
Alkaline Phosphatase: 134 U/L — ABNORMAL HIGH (ref 45–117)
Anion Gap: 8 mmol/L (ref 5–15)
BUN: 23 MG/DL — ABNORMAL HIGH (ref 6–20)
Bun/Cre Ratio: 22 — ABNORMAL HIGH (ref 12–20)
CO2: 27 mmol/L (ref 21–32)
Calcium: 10.6 MG/DL — ABNORMAL HIGH (ref 8.5–10.1)
Chloride: 103 mmol/L (ref 97–108)
Creatinine: 1.03 MG/DL — ABNORMAL HIGH (ref 0.55–1.02)
EGFR IF NonAfrican American: 53 mL/min/{1.73_m2} — ABNORMAL LOW (ref >60)
GFR African American: >60 mL/min/{1.73_m2} (ref >60)
Globulin: 3.6 g/dL (ref 2.0–4.0)
Glucose: 198 mg/dL — ABNORMAL HIGH (ref 65–100)
Potassium: 3.8 mmol/L (ref 3.5–5.1)
Sodium: 138 mmol/L (ref 136–145)
Total Bilirubin: 0.3 MG/DL (ref 0.2–1.0)
Total Protein: 7.5 g/dL (ref 6.4–8.2)

## 2020-08-22 LAB — CBC WITH AUTO DIFFERENTIAL
Basophils %: 0 % (ref 0–1)
Basophils Absolute: 0 10*3/uL (ref 0.0–0.1)
Eosinophils %: 2 % (ref 0–7)
Eosinophils Absolute: 0.2 10*3/uL (ref 0.0–0.4)
Granulocyte Absolute Count: 0 10*3/uL (ref 0.00–0.04)
Hematocrit: 39.1 % (ref 35.0–47.0)
Hemoglobin: 12.6 g/dL (ref 11.5–16.0)
Immature Granulocytes: 0 % (ref 0.0–0.5)
Lymphocytes %: 22 % (ref 12–49)
Lymphocytes Absolute: 1.6 10*3/uL (ref 0.8–3.5)
MCH: 32 PG (ref 26.0–34.0)
MCHC: 32.2 g/dL (ref 30.0–36.5)
MCV: 99.2 FL — ABNORMAL HIGH (ref 80.0–99.0)
MPV: 10.7 FL (ref 8.9–12.9)
Monocytes %: 9 % (ref 5–13)
Monocytes Absolute: 0.6 10*3/uL (ref 0.0–1.0)
NRBC Absolute: 0 10*3/uL (ref 0.00–0.01)
Neutrophils %: 67 % (ref 32–75)
Neutrophils Absolute: 4.7 10*3/uL (ref 1.8–8.0)
Nucleated RBCs: 0 PER 100 WBC
Platelets: 275 10*3/uL (ref 150–400)
RBC: 3.94 M/uL (ref 3.80–5.20)
RDW: 12.7 % (ref 11.5–14.5)
WBC: 7.2 10*3/uL (ref 3.6–11.0)

## 2020-08-25 MED ORDER — ATORVASTATIN 20 MG TAB
20 mg | ORAL_TABLET | Freq: Every day | ORAL | 3 refills | Status: AC
Start: 2020-08-25 — End: ?

## 2020-08-25 NOTE — ACP (Advance Care Planning) (Signed)
Advance Care Planning   Ambulatory ACP Specialist Patient Outreach    Date:  08/25/2020    ACP Specialist:  Amie [place], LPN    Outreach call to patient in follow-up to ACP Specialist referral from:    [x]  PCP  []  Provider   []  Ambulatory Care Management []  Other     For:                  [x]  Advance Directive Assistance              []  Complete Portable DNR order              []  Complete POST/MOST              []  Code Status Discussion             []  Discuss Goals of Care             []  Early ACP Decision-Making              []  Other (Specify)    Date Referral Received: 08/24/20    Today's Outreach:  [x]  First   []  Second  []  Third       Third outreach made by: []  Phone  []  Email / mail    []  MyChart     Intervention:  [x]  Spoke with Patient   []  Left VM requesting return call      Outcome: Spoke with pt who wishes to move forward with having a conversation about Advance Care Planning. Appt with ACP specialist is scheduled for 09/07/20 at 11am. ACP documents have been mailed to pt for review prior to appt.         Next Step:   [x]  ACP scheduled conversation  []  Outreach again in one week               [x]  Email / [place]  []  Email / Mail Advance Directive   []  Closing referral.  Routing closure to referring provider/staff and to ACP Personal assistant.    []  Closure letter mailed to patient with invitation to contact ACP Specialist if / when ready.  Thank you for this referral.

## 2020-08-28 ENCOUNTER — Encounter: Payer: Self-pay | Admitting: Hematology and Oncology

## 2020-08-28 ENCOUNTER — Other Ambulatory Visit: Payer: Self-pay | Admitting: Hematology and Oncology

## 2020-08-28 ENCOUNTER — Inpatient Hospital Stay: Payer: Medicare Other

## 2020-08-28 ENCOUNTER — Inpatient Hospital Stay (HOSPITAL_BASED_OUTPATIENT_CLINIC_OR_DEPARTMENT_OTHER): Payer: Medicare Other | Admitting: Hematology and Oncology

## 2020-08-28 ENCOUNTER — Other Ambulatory Visit: Payer: Self-pay

## 2020-08-28 VITALS — BP 113/64 | HR 72 | Temp 98.5°F | Resp 18 | Ht 64.0 in | Wt 154.5 lb

## 2020-08-28 DIAGNOSIS — C8288 Other types of follicular lymphoma, lymph nodes of multiple sites: Secondary | ICD-10-CM | POA: Diagnosis not present

## 2020-08-28 DIAGNOSIS — R252 Cramp and spasm: Secondary | ICD-10-CM | POA: Diagnosis not present

## 2020-08-28 DIAGNOSIS — Z95828 Presence of other vascular implants and grafts: Secondary | ICD-10-CM

## 2020-08-28 DIAGNOSIS — C8253 Diffuse follicle center lymphoma, intra-abdominal lymph nodes: Secondary | ICD-10-CM

## 2020-08-28 DIAGNOSIS — Z5111 Encounter for antineoplastic chemotherapy: Secondary | ICD-10-CM | POA: Diagnosis not present

## 2020-08-28 DIAGNOSIS — R5383 Other fatigue: Secondary | ICD-10-CM | POA: Diagnosis not present

## 2020-08-28 DIAGNOSIS — K654 Sclerosing mesenteritis: Secondary | ICD-10-CM | POA: Diagnosis not present

## 2020-08-28 DIAGNOSIS — Z79899 Other long term (current) drug therapy: Secondary | ICD-10-CM | POA: Diagnosis not present

## 2020-08-28 LAB — CMP (CANCER CENTER ONLY)
ALT: 24 U/L (ref 0–44)
AST: 18 U/L (ref 15–41)
Albumin: 3.1 g/dL — ABNORMAL LOW (ref 3.5–5.0)
Alkaline Phosphatase: 87 U/L (ref 38–126)
Anion gap: 9 (ref 5–15)
BUN: 11 mg/dL (ref 8–23)
CO2: 25 mmol/L (ref 22–32)
Calcium: 8.8 mg/dL — ABNORMAL LOW (ref 8.9–10.3)
Chloride: 109 mmol/L (ref 98–111)
Creatinine: 0.74 mg/dL (ref 0.44–1.00)
GFR, Estimated: >60 mL/min (ref >60)
Glucose, Bld: 79 mg/dL (ref 70–99)
Potassium: 3.7 mmol/L (ref 3.5–5.1)
Sodium: 143 mmol/L (ref 135–145)
Total Bilirubin: 0.5 mg/dL (ref 0.3–1.2)
Total Protein: 5.7 g/dL — ABNORMAL LOW (ref 6.5–8.1)

## 2020-08-28 LAB — CBC WITH DIFFERENTIAL (CANCER CENTER ONLY)
Abs Immature Granulocytes: 0.3 10*3/uL — ABNORMAL HIGH (ref 0.00–0.07)
Basophils Absolute: 0.1 10*3/uL (ref 0.0–0.1)
Basophils Relative: 1 %
Eosinophils Absolute: 0.1 10*3/uL (ref 0.0–0.5)
Eosinophils Relative: 1 %
HCT: 37.8 % (ref 36.0–46.0)
Hemoglobin: 12.8 g/dL (ref 12.0–15.0)
Immature Granulocytes: 4 %
Lymphocytes Relative: 20 %
Lymphs Abs: 1.4 10*3/uL (ref 0.7–4.0)
MCH: 31.8 pg (ref 26.0–34.0)
MCHC: 33.9 g/dL (ref 30.0–36.0)
MCV: 94 fL (ref 80.0–100.0)
Monocytes Absolute: 0.8 10*3/uL (ref 0.1–1.0)
Monocytes Relative: 11 %
Neutro Abs: 4.4 10*3/uL (ref 1.7–7.7)
Neutrophils Relative %: 63 %
Platelet Count: 230 10*3/uL (ref 150–400)
RBC: 4.02 MIL/uL (ref 3.87–5.11)
RDW: 15.6 % — ABNORMAL HIGH (ref 11.5–15.5)
WBC Count: 7 10*3/uL (ref 4.0–10.5)
nRBC: 0 % (ref 0.0–0.2)

## 2020-08-28 LAB — LACTATE DEHYDROGENASE: LDH: 229 U/L — ABNORMAL HIGH (ref 98–192)

## 2020-08-28 MED ORDER — HEPARIN SOD (PORK) LOCK FLUSH 100 UNIT/ML IV SOLN
500.0000 [IU] | Freq: Once | INTRAVENOUS | Status: DC | PRN
  Filled 2020-08-28: qty 5

## 2020-08-28 MED ORDER — SODIUM CHLORIDE 0.9 % IV SOLN
Freq: Once | INTRAVENOUS | Status: AC
  Filled 2020-08-28: qty 250

## 2020-08-28 MED ORDER — DIPHENHYDRAMINE HCL 25 MG PO CAPS
50.0000 mg | ORAL_CAPSULE | Freq: Once | ORAL | Status: AC
  Administered 2020-08-28: 50 mg via ORAL

## 2020-08-28 MED ORDER — SODIUM CHLORIDE 0.9% FLUSH
10.0000 mL | INTRAVENOUS | Status: DC | PRN
Start: 2020-08-28 — End: 2020-08-28
  Filled 2020-08-28: qty 10

## 2020-08-28 MED ORDER — ACETAMINOPHEN 325 MG PO TABS
650.0000 mg | ORAL_TABLET | Freq: Once | ORAL | Status: AC
  Administered 2020-08-28: 650 mg via ORAL

## 2020-08-28 MED ORDER — SODIUM CHLORIDE 0.9 % IV SOLN
375.0000 mg/m2 | Freq: Once | INTRAVENOUS | Status: AC
  Administered 2020-08-28: 700 mg via INTRAVENOUS
  Filled 2020-08-28: qty 50

## 2020-08-28 MED ORDER — SODIUM CHLORIDE 0.9% FLUSH
10.0000 mL | Freq: Once | INTRAVENOUS | Status: AC
  Administered 2020-08-28: 10 mL
  Filled 2020-08-28: qty 10

## 2020-08-28 NOTE — Progress Notes (Signed)
Pt discharged in no apparent distress. Pt left ambulatory without assistance. Pt aware of discharge instructions and verbalized understanding and had no further questions.  

## 2020-08-28 NOTE — Patient Instructions (Signed)
[facility] Discharge Instructions for Patients Receiving Chemotherapy  Today you received the following chemotherapy agents:  Rituxan.  To help prevent nausea and vomiting after your treatment, we encourage you to take your nausea medication as directed.   If you develop nausea and vomiting that is not controlled by your nausea medication, call the clinic.   BELOW ARE SYMPTOMS THAT SHOULD BE REPORTED IMMEDIATELY:  *FEVER GREATER THAN 100.5 F  *CHILLS WITH OR WITHOUT FEVER  NAUSEA AND VOMITING THAT IS NOT CONTROLLED WITH YOUR NAUSEA MEDICATION  *UNUSUAL SHORTNESS OF BREATH  *UNUSUAL BRUISING OR BLEEDING  TENDERNESS IN MOUTH AND THROAT WITH OR WITHOUT PRESENCE OF ULCERS  *URINARY PROBLEMS  *BOWEL PROBLEMS  UNUSUAL RASH Items with * indicate a potential emergency and should be followed up as soon as possible.  Feel free to call the clinic should you have any questions or concerns. The clinic phone number is [phone].  Please show the CHEMO ALERT CARD at check-in to the Emergency Department and triage nurse.   

## 2020-08-28 NOTE — Patient Instructions (Signed)
[facility] Discharge Instructions for Patients Receiving Chemotherapy  Today you received the following chemotherapy agents:  Rituxan.  To help prevent nausea and vomiting after your treatment, we encourage you to take your nausea medication as directed.   If you develop nausea and vomiting that is not controlled by your nausea medication, call the clinic.   BELOW ARE SYMPTOMS THAT SHOULD BE REPORTED IMMEDIATELY:  *FEVER GREATER THAN 100.5 F  *CHILLS WITH OR WITHOUT FEVER  NAUSEA AND VOMITING THAT IS NOT CONTROLLED WITH YOUR NAUSEA MEDICATION  *UNUSUAL SHORTNESS OF BREATH  *UNUSUAL BRUISING OR BLEEDING  TENDERNESS IN MOUTH AND THROAT WITH OR WITHOUT PRESENCE OF ULCERS  *URINARY PROBLEMS  *BOWEL PROBLEMS  UNUSUAL RASH Items with * indicate a potential emergency and should be followed up as soon as possible.  Feel free to call the clinic should you have any questions or concerns. The clinic phone number is [phone].  Please show the CHEMO ALERT CARD at check-in to the Emergency Department and triage nurse.   

## 2020-08-28 NOTE — Progress Notes (Signed)
[place] [phone]   [phone]  PROGRESS NOTE  Patient Care Team: Prince Solian, MD as PCP - General (Internal Medicine) Mcarthur Rossetti, MD as Consulting Physician (Orthopedic Surgery)  Hematological/Oncological History # Non-Hodgkin B Cell Lymphoma, Follicular Lymphoma. Stage III 1) 02/25/2020: patient underwent excision of a 1.3 cm mobile nodule on the left vertex of the scalp. 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  2) 03/18/2020: Establish care with Dr. Lorenso Courier  3) 03/30/2020: PET CT scan showed  involvement of the left scalp, left axillary lymph node, left hilar and infrahilar nodes, subcarinal node, and mesenteric lymph nodes, as well as a masslike appearance/ high metabolic activity in the lower uterus/cervix.  4) 04/21/2020: biopsy of the mass in the lower uterus/cervix consistent with follicular lymphoma.  5) 06/25/2020: CT C/A/P showed the soft tissue mass in the abdomen increase in size from 5.8 x 3.6 cm to 6.5 x 5.7 cm. The other lymphadenopathy was stable. Patient elected to move forward with treatment.  6) 07/06/2020: Cycle 1 Day 1 of Rituximab/Bendamustine 7) 07/17/2020-07/22/2020: Admitted to [facility] with fevers. Infectious workup negative, but scans show mesenteric panniculitis (fibrosing mesenteritis). Started on prednisone 40mg  PO daily.  8) 08/06/2020: start of Rituximab monotherapy.   Interval History:  Frona Yost 69 y.o. female with medical history significant for diffuse non Hodgkin B cell lymphoma who presents for a follow up visit. The patient's last visit was on 07/31/2020 prior to the start of monotherapy rituximab. In the interim since her last visit she received 3 weeks of treatment and presents for her final one today.   On exam today Ms. Kang is accompanied by her husband.  She reports that he has been tolerating rituximab  monotherapy quite well.  She does have some issues with fatigue and muscle cramps, but otherwise denies any fevers, chills, sweats, nausea, running or diarrhea.  She notes that overall she feels good and that her appetite is still good.  She has not noticed any new lymphadenopathy on her head, neck, or elsewhere in her body.  She reports that she is excited to be taking a trip to [place] to visit her family over the holidays.  Additionally she does not have any questions concerns or complaints moving forward with her final treatment of rituximab today.  A portion of our discussion was focused on the treatment plan moving forward.  We will plan to complete her rituximab weekly therapy x4 weeks followed by rituximab q. 8 weeks x 4 doses.  The patient and her husband voiced understanding of this plan moving forward  MEDICAL HISTORY:  Past Medical History:  Diagnosis Date  . Allergy   . Anxiety   . Asthma   . Cataract 2018   removed  . Complication of anesthesia    drop in BP  . DDD (degenerative disc disease)   . Depression   . GERD (gastroesophageal reflux disease)   . Hemorrhoids   . Hyperlipidemia   . Lymphoma ([place]) dx 2021   no tx for now  . Vitamin D deficiency     SURGICAL HISTORY: Past Surgical History:  Procedure Laterality Date  . ATRIAL ABLATION SURGERY  1997   SVT  . cataract surgery Bilateral 2017  . COLONOSCOPY  2016  . FOOT NEUROMA SURGERY     multiple times bilaterally  . IR IMAGING GUIDED PORT INSERTION  07/13/2020  . PLANTAR FASCIA RELEASE Left 1999 or 2000  . SHOULDER  ARTHROSCOPY Left 04/19/2019   Procedure: LEFT SHOULDER MANIPULATION UNDER ANESTHESIA AND ARTHROSCOPY WITH EXTENSIVE DEBRIDEMENT;  Surgeon: Mcarthur Rossetti, MD;  Location: [facility] ORS;  Service: Orthopedics;  Laterality: Left;  . UPPER GI ENDOSCOPY  yrs ago    SOCIAL HISTORY: Social History   Socioeconomic History  . Marital status: Married    Spouse name: Not on file  . Number of children:  0  . Years of education: Not on file  . Highest education level: Not on file  Occupational History  . Occupation: retired Catering manager  Tobacco Use  . Smoking status: Never Smoker  . Smokeless tobacco: Never Used  Vaping Use  . Vaping Use: Never used  Substance and Sexual Activity  . Alcohol use: No  . Drug use: No  . Sexual activity: Not Currently    Partners: Male    Birth control/protection: Post-menopausal  Other Topics Concern  . Not on file  Social History Narrative  . Not on file   Social Determinants of Health   Financial Resource Strain: Not on file  Food Insecurity: Not on file  Transportation Needs: Not on file  Physical Activity: Not on file  Stress: Not on file  Social Connections: Not on file  Intimate Partner Violence: Not on file    FAMILY HISTORY: Family History  Problem Relation Age of Onset  . Heart disease Mother   . Diabetes Mother   . Tuberculosis Paternal Grandfather   . Bone cancer Paternal Grandfather   . Breast cancer Sister 53  . Melanoma Sister   . Cirrhosis Maternal Grandfather   . Heart failure Paternal Grandmother   . Colon cancer Neg Hx   . Ovarian cancer Neg Hx   . Uterine cancer Neg Hx     ALLERGIES:  is allergic to codeine, penicillins, keflex [cephalexin], latex, and sulfa antibiotics.  MEDICATIONS:  Current Outpatient Medications  Medication Sig Dispense Refill  . acetaminophen (TYLENOL) 500 MG tablet Take 1,000 mg by mouth every 6 (six) hours as needed for moderate pain or headache.    Marland Kitchen acyclovir (ZOVIRAX) 400 MG tablet Take 1 tablet (400 mg total) by mouth 2 (two) times daily. 180 tablet 1  . allopurinol (ZYLOPRIM) 300 MG tablet Take 1 tablet (300 mg total) by mouth daily. 90 tablet 1  . ARIPiprazole (ABILIFY) 2 MG tablet Take 2 mg by mouth daily.     Marland Kitchen aspirin 81 MG tablet Take 81 mg by mouth at bedtime.     . [employer] FIBER GUMMIES PO Take 2 each by mouth daily.     Marland Kitchen EPINEPHrine 0.3 mg/0.3 mL IJ SOAJ injection Inject  0.3 mg into the muscle as needed for anaphylaxis.     Marland Kitchen GLUCOSAMINE-CHONDROITIN-MSM PO Take 1 tablet by mouth 2 (two) times a day.    . levalbuterol (XOPENEX HFA) 45 MCG/ACT inhaler Inhale 1 puff into the lungs every 4 (four) hours as needed for wheezing or shortness of breath. (Patient not taking: Reported on 07/17/2020) 1 Inhaler 3  . Melatonin 5 MG TABS Take 20 mg by mouth at bedtime.     . NONFORMULARY OR COMPOUNDED ITEM Estradiol 0.02% Insert 1 gram vaginally 2 times per week. 3 month supply with 3 refills. (Patient not taking: Reported on 07/17/2020) 3 each 3  . nystatin (MYCOSTATIN) 100000 UNIT/ML suspension Take 5 mLs (500,000 Units total) by mouth 4 (four) times daily. 60 mL 0  . nystatin cream (MYCOSTATIN) Apply 1 application topically 2 (two) times daily. Apply to  affected area BID for up to 7 days. (Patient not taking: Reported on 07/17/2020) 30 g 0  . ondansetron (ZOFRAN) 8 MG tablet Take 1 tablet (8 mg total) by mouth every 8 (eight) hours as needed for nausea or vomiting. 30 tablet 0  . pantoprazole (PROTONIX) 40 MG tablet Take 40 mg by mouth 2 (two) times daily.    Marland Kitchen PARoxetine (PAXIL) 40 MG tablet Take 40 mg by mouth daily.     . phenazopyridine (PYRIDIUM) 100 MG tablet Take 1 tablet (100 mg total) by mouth 3 (three) times daily as needed for pain. 21 tablet 0  . potassium chloride (KLOR-CON) 10 MEQ tablet Take 2 tablets (20 mEq total) by mouth daily. 15 tablet 0  . pravastatin (PRAVACHOL) 40 MG tablet Take 40 mg by mouth at bedtime.     . predniSONE (DELTASONE) 20 MG tablet Take 1 tablet (20 mg total) by mouth daily with breakfast. 60 tablet 0  . prochlorperazine (COMPAZINE) 10 MG tablet Take 1 tablet (10 mg total) by mouth every 6 (six) hours as needed for nausea or vomiting. 30 tablet 0  . SINGULAIR 10 MG tablet Take 10 mg by mouth at bedtime.     . Vitamin D, Ergocalciferol, (DRISDOL) 50000 UNITS CAPS Take 50,000 Units by mouth every Monday.      No current  facility-administered medications for this visit.   Facility-Administered Medications Ordered in Other Visits  Medication Dose Route Frequency Provider Last Rate Last Admin  . heparin lock flush 100 unit/mL  500 Units Intracatheter Once PRN Ledell Peoples IV, MD      . sodium chloride flush (NS) 0.9 % injection 10 mL  10 mL Intracatheter PRN Orson Slick, MD        REVIEW OF SYSTEMS:   Constitutional: ( - ) fevers, ( - )  chills , ( - ) night sweats Eyes: ( - ) blurriness of vision, ( - ) double vision, ( - ) watery eyes Ears, nose, mouth, throat, and face: ( - ) mucositis, ( - ) sore throat Respiratory: ( - ) cough, ( - ) dyspnea, ( - ) wheezes Cardiovascular: ( - ) palpitation, ( - ) chest discomfort, ( - ) lower extremity swelling Gastrointestinal:  ( - ) nausea, ( - ) heartburn, ( - ) change in bowel habits Skin: ( - ) abnormal skin rashes Lymphatics: ( - ) new lymphadenopathy, ( - ) easy bruising Neurological: ( - ) numbness, ( - ) tingling, ( - ) new weaknesses Behavioral/Psych: ( - ) mood change, ( - ) new changes  All other systems were reviewed with the patient and are negative.  PHYSICAL EXAMINATION: ECOG PERFORMANCE STATUS: 1 - Symptomatic but completely ambulatory  Vitals:   08/28/20 1107 08/28/20 1324  BP: 114/62 113/64  Pulse: 78 72  Resp: 18 18  Temp: 98.5 F (36.9 C)   SpO2: 97%    Filed Weights   08/28/20 1107  Weight: 154 lb 8 oz (70.1 kg)    GENERAL: well appearing elderly Caucasian female. alert, no distress and comfortable SKIN: scalp lesion reduced to a concave dent in scalp. skin color, texture, turgor are normal, no rashes or significant lesions EYES: conjunctiva are pink and non-injected, sclera clear LYMPH:  no palpable lymphadenopathy in the cervical, axillary or supraclavicular LUNGS: clear to auscultation and percussion with normal breathing effort HEART: regular rate & rhythm and no murmurs and no lower extremity edema Musculoskeletal: no  cyanosis of digits  and no clubbing  PSYCH: alert & oriented x 3, fluent speech NEURO: no focal motor/sensory deficits  LABORATORY DATA:  I have reviewed the data as listed CBC Latest Ref Rng & Units 08/28/2020 08/20/2020 08/14/2020  WBC 4.0 - 10.5 K/uL 7.0 5.3 3.9(L)  Hemoglobin 12.0 - 15.0 g/dL 12.8 12.8 13.8  Hematocrit 36.0 - 46.0 % 37.8 38.8 41.0  Platelets 150 - 400 K/uL 230 175 163    CMP Latest Ref Rng & Units 08/28/2020 08/20/2020 08/14/2020  Glucose 70 - 99 mg/dL 79 83 85  BUN 8 - 23 mg/dL 11 11 8   Creatinine 0.44 - 1.00 mg/dL 0.74 0.77 0.76  Sodium 135 - 145 mmol/L 143 141 143  Potassium 3.5 - 5.1 mmol/L 3.7 3.7 3.8  Chloride 98 - 111 mmol/L 109 106 108  CO2 22 - 32 mmol/L 25 30 26   Calcium 8.9 - 10.3 mg/dL 8.8(L) 8.9 8.9  Total Protein 6.5 - 8.1 g/dL 5.7(L) 5.6(L) 6.0(L)  Total Bilirubin 0.3 - 1.2 mg/dL 0.5 0.5 0.6  Alkaline Phos 38 - 126 U/L 87 85 99  AST 15 - 41 U/L 18 20 21   ALT 0 - 44 U/L 24 25 37    No results found for: MPROTEIN No results found for: KPAFRELGTCHN, LAMBDASER, KAPLAMBRATIO   RADIOGRAPHIC STUDIES: I have personally reviewed the radiological images as listed and agreed with the findings in the report: apparent resolution of previously noted lymphadenopathy.   No results found.  ASSESSMENT & PLAN EARNEST THALMAN 69 y.o. female with medical history significant for diffuse non Hodgkin B cell lymphoma who presents for a follow up visit.  After review of the imaging, review the labs, discussion with the patient her findings are most consistent with a diffuse non-Hodgkin B-cell lymphoma, follicular lymphoma.  The initial diagnosis based off the biopsy from the lesion on the head was that of a follicle cell lymphoma which tends to be a cutaneous lymphoma, however this diagnosis was considerably less likely given the diffuse spread throughout the lymph nodes of the chest and abdomen. Biopsy of the FDG avid lesion in the pelvis resulted with follicular  lymphoma.   On exam today Mrs. Becraft is accompanied by her husband.  Notes other than some fatigue she has been tolerating the rituximab therapy quite well.  She does occasionally have some muscle cramps.  Overall she is feeling well and able to proceed with her final treatment of rituximab today.  We discussed the plan for a PET CT scan in approximately 4 weeks time and the need for rituximab maintenance every 8 weeks x4 doses.  Previously we discussed the diagnosis of follicular lymphoma and the options moving forward.  She currently does not meet any GELF Criteria and therefore is not required to start therapy.  We discussed that treatment could be started if she so desired and we discussed the risks and benefits of bendamustine rituximab therapy.  She voiced her understanding of the different options between starting treatment and continued observation.  After discussion with her husband she noted she would like to start treatment given the continued growth of the lesion which was abutting the urinary bladder.   The regimen of choice for this patient is Rituximab monotherapy with Rituximab 375mg /m2 IV weekly x 4 weeks, followed by IV rituximab q 8 weeks thereafter. This is being administered with curative intent.  In addition to this regimen from bendamustine rituximab due to intolerance and hospitalization following her first cycle of treatment.  # Non-Hodgkin  B Cell Lymphoma, Follicular Lymphoma. Stage III --pre treatment CT findings showed lymphadenopathy on both sides of the diaphragm. Consistent with at least a Stage III lymphoma -- confirmed the diagnosis with biopsy of the FDG avid mass near the uterus/cervix. Cancel the left axillary lymph node biopsy --started treatment with R-Benda, however the patient ended up hospitalized due to fever, weakness, and marked drop in her labs.  She did not tolerate rituximab and bendamustine combination altogether and therefore we proceeded with  rituximab monotherapy alone. Today is last dose of Rituximab weekly x 4 weeks.  --completed chemotherapy education, port placement. Supportive care (noted below)   --RTC in 4 weeks after interval PET CT scan.   #Fibrosing Mesenteritis (Mesenteric Panniculitis), improving --improved with prednisone 40mg  PO daily  --now that rituximab therapy is complete can begain weaning the dose --recommend 30mg  PO daily x 2 weeks followed by 20mg  PO daily x 2 weeks.  --reassess at next visit.   #Supportive Care --Chemotherapy Education to be scheduled  --zofran 8mg  q8H PRN and compazine 10mg  PO q6H for nausea --acyclovir 400mg  PO BID for VCZ prophylaxis -- allopurinol 300mg  PO daily for TLS prophylaxis -- EMLA cream for port -- no pain medication required at this time.   No orders of the defined types were placed in this encounter.  All questions were answered. The patient knows to call the clinic with any problems, questions or concerns.  A total of more than 30 minutes were spent on this encounter and over half of that time was spent on counseling and coordination of care as outlined above.   Ledell Peoples, MD Department of Hematology/Oncology [place] [facility] Phone: [phone] Pager: [phone] Email: [email or web address]  08/28/2020 4:50 PM

## 2020-08-28 NOTE — Patient Instructions (Signed)

## 2020-09-03 NOTE — ACP (Advance Care Planning) (Signed)
Advance Care Planning   Ambulatory ACP Specialist Patient Outreach    Date:  09/03/2020    ACP Specialist:  Elouise Munroe, LCSW    Outreach call to patient in follow-up to ACP Specialist referral from:    [x]  PCP  []  Provider   []  Ambulatory Care Management []  Other     For:                  [x]  Advance Directive Assistance              []  Complete Portable DNR order              []  Complete POST/MOST              []  Code Status Discussion             []  Discuss Goals of Care             []  Early ACP Decision-Making              []  Other (Specify)    Date Referral Received: 08/24/20    Today's Outreach:  []  First   [x]  Second  []  Third       Third outreach made by: []  Phone  []  Email / mail    []  MyChart     Intervention:  [x]  Spoke with Patient   []  Left VM requesting return call      Outcome:ACP appt rescheduled for 09/30/2020 @ 11 so she will have time to review ACP materials.       Next Step:   [x]  ACP scheduled conversation  []  Outreach again in one week               []  Email / Mail ACP Agricultural consultant / Mail Advance Directive   []  Closing referral.  Routing closure to referring provider/staff and to Lexicographer.    []  Closure letter mailed to patient with invitation to contact ACP Specialist if / when ready.  Thank you for this referral.  Elouise Munroe, LCSW

## 2020-09-25 ENCOUNTER — Encounter: Payer: MEDICARE | Attending: Specialist | Primary: Family

## 2020-09-27 ENCOUNTER — Other Ambulatory Visit: Payer: Self-pay | Admitting: Hematology and Oncology

## 2020-09-28 ENCOUNTER — Other Ambulatory Visit: Payer: Self-pay

## 2020-09-28 ENCOUNTER — Ambulatory Visit (HOSPITAL_COMMUNITY)
Admission: RE | Admit: 2020-09-28 | Discharge: 2020-09-28 | Disposition: A | Discharge status: Home / Self Care | Payer: Medicare Other | Source: Ambulatory Visit | Attending: Hematology and Oncology | Admitting: Hematology and Oncology

## 2020-09-28 DIAGNOSIS — C8253 Diffuse follicle center lymphoma, intra-abdominal lymph nodes: Secondary | ICD-10-CM | POA: Diagnosis not present

## 2020-09-28 DIAGNOSIS — C8292 Follicular lymphoma, unspecified, intrathoracic lymph nodes: Secondary | ICD-10-CM | POA: Diagnosis not present

## 2020-09-28 LAB — GLUCOSE, CAPILLARY: Glucose-Capillary: 89 mg/dL (ref 70–99)

## 2020-09-28 MED ORDER — FLUDEOXYGLUCOSE F - 18 (FDG) INJECTION: 7.6000 | Freq: Once | INTRAVENOUS | Status: DC | PRN

## 2020-09-28 NOTE — Telephone Encounter (Signed)
Does Alyssa Elliott need to continue her prednisone?

## 2020-09-30 ENCOUNTER — Telehealth: Payer: Self-pay | Admitting: *Deleted

## 2020-09-30 NOTE — Telephone Encounter (Signed)
-----   Message from Orson Slick, MD sent at 09/28/2020  3:14 PM EST ----- Please let Mrs. Foulkes know that her PET CT scan looks excellent with no enlarged or PET avid lymph nodes. Findings are consistent with a complete response. We will see her back shortly to discuss maintenance rituximab.    ----- Message ----- From: Interface, Rad Results In Sent: 09/28/2020  11:45 AM EST To: Orson Slick, MD

## 2020-09-30 NOTE — Telephone Encounter (Signed)
[name] patient regarding recent PET scan results.  Spoke with Alyssa Elliott and advised that her PET scan results are great.-per Dr. Lorenso Courier, she has had a complete response. She states she saw her results in [facility] Chart. She is very pleased with her results. She is aware of her upcoming appt on 10/05/20

## 2020-09-30 NOTE — ACP (Advance Care Planning) (Signed)
Advance Care Planning   Ambulatory ACP Specialist Patient Outreach    Date:  09/30/2020    ACP Specialist:  Clinton Gallant, LCSW    Outreach call to patient in follow-up to ACP Specialist referral from:    []  PCP  []  Provider   []  Ambulatory Care Management []  Other     For:                  [x]  Advance Directive Assistance              []  Complete Portable DNR order              []  Complete POST/MOST              []  Code Status Discussion             []  Discuss Goals of Care             []  Early ACP Decision-Making              []  Other (Specify)    Date Referral Received: 08/24/20    Today's Outreach:  []  First   []  Second  [x]  Third       Third outreach made by: []  Phone  []  Email / mail    []  MyChart     Intervention:  [x]  Spoke with Patient   []  Left VM requesting return call      Outcome:Pt requested rescheduling appt for 10/14/2020.       Next Step:   [x]  ACP scheduled conversation  []  Outreach again in one week               []  Email / Mail ACP Info Sheets  []  Email / Mail Advance Directive   []  Closing referral.  Routing closure to referring provider/staff and to Engineer, site.    []  Closure letter mailed to patient with invitation to contact ACP Specialist if / when ready.  Thank you for this referral.  Copy, LCSW

## 2020-10-01 ENCOUNTER — Telehealth: Payer: Self-pay | Admitting: *Deleted

## 2020-10-01 NOTE — Telephone Encounter (Signed)
[name] patient to inquire about her  Prednisone. She states she has about 1 week supply right now. Advised to continue that until she sees Dr. Lorenso Courier on Monday, 10/05/20 and then he will talk to her about tapering her off the steroids. She voiced understanding.

## 2020-10-02 ENCOUNTER — Telehealth: Payer: Self-pay | Admitting: Hematology and Oncology

## 2020-10-02 NOTE — Telephone Encounter (Signed)
Appointment r/s as a phone visit due to weather concerns. Spoke to patient who is aware.

## 2020-10-05 ENCOUNTER — Encounter: Payer: Self-pay | Admitting: Hematology and Oncology

## 2020-10-05 ENCOUNTER — Inpatient Hospital Stay: Payer: Medicare Other | Attending: Hematology and Oncology | Admitting: Hematology and Oncology

## 2020-10-05 ENCOUNTER — Other Ambulatory Visit: Payer: Medicare Other

## 2020-10-05 DIAGNOSIS — Z95828 Presence of other vascular implants and grafts: Secondary | ICD-10-CM

## 2020-10-05 DIAGNOSIS — C8253 Diffuse follicle center lymphoma, intra-abdominal lymph nodes: Secondary | ICD-10-CM

## 2020-10-05 NOTE — Progress Notes (Signed)
DISCONTINUE ON PATHWAY REGIMEN - Lymphoma and CLL     Administer weekly:     Rituximab-xxxx   **Always confirm dose/schedule in your pharmacy ordering system**  REASON: Other Reason PRIOR TREATMENT: [id number]: Rituximab 375 mg/m2 IV Weekly x 4 Weeks TREATMENT RESPONSE: Complete Response (CR)  START OFF PATHWAY REGIMEN - Lymphoma and CLL   [id number]:Rituximab 375 mg/m2 q2 Months (Maintenance):   A cycle is every 2 months:     Rituximab-xxxx   **Always confirm dose/schedule in your pharmacy ordering system**  Patient Characteristics: Follicular Lymphoma, Grades 1, 2, and 3A, First Line, Stage III / IV, Symptomatic or Bulky Disease Disease Type: Follicular Lymphoma, Grade 1, 2, or 3A Disease Type: Not Applicable Disease Type: Not Applicable Line of Therapy: First Line Disease Characteristics: Symptomatic or Bulky Disease Intent of Therapy: Curative Intent, Discussed with Patient

## 2020-10-05 NOTE — Progress Notes (Signed)
[place] [phone]   [phone]  PROGRESS NOTE  Patient Care Team: Prince Solian, MD as PCP - General (Internal Medicine) Mcarthur Rossetti, MD as Consulting Physician (Orthopedic Surgery)  Hematological/Oncological History # Non-Hodgkin B Cell Lymphoma, Follicular Lymphoma. Stage III 1) 02/25/2020: patient underwent excision of a 1.3 cm mobile nodule on the left vertex of the scalp. 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  2) 03/18/2020: Establish care with Dr. Lorenso Courier  3) 03/30/2020: PET CT scan showed  involvement of the left scalp, left axillary lymph node, left hilar and infrahilar nodes, subcarinal node, and mesenteric lymph nodes, as well as a masslike appearance/ high metabolic activity in the lower uterus/cervix.  4) 04/21/2020: biopsy of the mass in the lower uterus/cervix consistent with follicular lymphoma.  5) 06/25/2020: CT C/A/P showed the soft tissue mass in the abdomen increase in size from 5.8 x 3.6 cm to 6.5 x 5.7 cm. The other lymphadenopathy was stable. Patient elected to move forward with treatment.  6) 07/06/2020: Cycle 1 Day 1 of Rituximab/Bendamustine 7) 07/17/2020-07/22/2020: Admitted to [facility] with fevers. Infectious workup negative, but scans show mesenteric panniculitis (fibrosing mesenteritis). Started on prednisone 40mg  PO daily.  8) 08/06/2020-08/27/2020: Weekly Rituximab monotherapy.  9) 09/28/2020: PET CT scan shows no residual disease. Complete response noted.   Interval History:  Alyssa Elliott 70 y.o. female with medical history significant for diffuse non Hodgkin B cell lymphoma who presents for a telephone follow up visit. The patient's last visit was on 08/28/2020 near the end of her weekly monotherapy rituximab. In the interim since her last visit she had a PET CT scan which showed a complete response to therapy with no residual FDG avid  disease.  On exam today Ms. Vrooman is accompanied by her husband over the phone.  She reports that she has been quite well in the interim since her last visit.  She notes that she is "not quite 100%, but about 99%" back to normal she reports that she has a stable weight and her appetite has been quite good.  She is also still been staying home for the holiday and did not have any COVID exposure.  This is fortunate she stayed home as the family she was going to visit did come down with the infection.    She has been on the steroid therapy and notes that it does occasionally give her the shakes.  She is currently 20 mg and has 1 week of his left will be de-escalating down to 10 mg next week.  Otherwise she currently denies any fevers, chills, sweats, nausea, vomiting or diarrhea.  She does not have any new lymphadenopathy.  Full 10 point ROS is listed below.  MEDICAL HISTORY:  Past Medical History:  Diagnosis Date  . Allergy   . Anxiety   . Asthma   . Cataract 2018   removed  . Complication of anesthesia    drop in BP  . DDD (degenerative disc disease)   . Depression   . GERD (gastroesophageal reflux disease)   . Hemorrhoids   . Hyperlipidemia   . Lymphoma ([place]) dx 2021   no tx for now  . Vitamin D deficiency     SURGICAL HISTORY: Past Surgical History:  Procedure Laterality Date  . ATRIAL ABLATION SURGERY  1997   SVT  . cataract surgery Bilateral 2017  . COLONOSCOPY  2016  . FOOT NEUROMA SURGERY     multiple  times bilaterally  . IR IMAGING GUIDED PORT INSERTION  07/13/2020  . PLANTAR FASCIA RELEASE Left 1999 or 2000  . SHOULDER ARTHROSCOPY Left 04/19/2019   Procedure: LEFT SHOULDER MANIPULATION UNDER ANESTHESIA AND ARTHROSCOPY WITH EXTENSIVE DEBRIDEMENT;  Surgeon: Mcarthur Rossetti, MD;  Location: [facility];  Service: Orthopedics;  Laterality: Left;  . UPPER GI ENDOSCOPY  yrs ago    SOCIAL HISTORY: Social History   Socioeconomic History  . Marital status: Married     Spouse name: Not on file  . Number of children: 0  . Years of education: Not on file  . Highest education level: Not on file  Occupational History  . Occupation: retired Catering manager  Tobacco Use  . Smoking status: Never Smoker  . Smokeless tobacco: Never Used  Vaping Use  . Vaping Use: Never used  Substance and Sexual Activity  . Alcohol use: No  . Drug use: No  . Sexual activity: Not Currently    Partners: Male    Birth control/protection: Post-menopausal  Other Topics Concern  . Not on file  Social History Narrative  . Not on file   Social Determinants of Health   Financial Resource Strain: Not on file  Food Insecurity: Not on file  Transportation Needs: Not on file  Physical Activity: Not on file  Stress: Not on file  Social Connections: Not on file  Intimate Partner Violence: Not on file    FAMILY HISTORY: Family History  Problem Relation Age of Onset  . Heart disease Mother   . Diabetes Mother   . Tuberculosis Paternal Grandfather   . Bone cancer Paternal Grandfather   . Breast cancer Sister 20  . Melanoma Sister   . Cirrhosis Maternal Grandfather   . Heart failure Paternal Grandmother   . Colon cancer Neg Hx   . Ovarian cancer Neg Hx   . Uterine cancer Neg Hx     ALLERGIES:  is allergic to codeine, penicillins, keflex [cephalexin], latex, and sulfa antibiotics.  MEDICATIONS:  Current Outpatient Medications  Medication Sig Dispense Refill  . acetaminophen (TYLENOL) 500 MG tablet Take 1,000 mg by mouth every 6 (six) hours as needed for moderate pain or headache.    Marland Kitchen acyclovir (ZOVIRAX) 400 MG tablet Take 1 tablet (400 mg total) by mouth 2 (two) times daily. 180 tablet 1  . allopurinol (ZYLOPRIM) 300 MG tablet Take 1 tablet (300 mg total) by mouth daily. 90 tablet 1  . ARIPiprazole (ABILIFY) 2 MG tablet Take 2 mg by mouth daily.     Marland Kitchen aspirin 81 MG tablet Take 81 mg by mouth at bedtime.     . [employer] FIBER GUMMIES PO Take 2 each by mouth daily.     Marland Kitchen  EPINEPHrine 0.3 mg/0.3 mL IJ SOAJ injection Inject 0.3 mg into the muscle as needed for anaphylaxis.     Marland Kitchen GLUCOSAMINE-CHONDROITIN-MSM PO Take 1 tablet by mouth 2 (two) times a day.    . levalbuterol (XOPENEX HFA) 45 MCG/ACT inhaler Inhale 1 puff into the lungs every 4 (four) hours as needed for wheezing or shortness of breath. (Patient not taking: Reported on 07/17/2020) 1 Inhaler 3  . Melatonin 5 MG TABS Take 20 mg by mouth at bedtime.     . NONFORMULARY OR COMPOUNDED ITEM Estradiol 0.02% Insert 1 gram vaginally 2 times per week. 3 month supply with 3 refills. (Patient not taking: Reported on 07/17/2020) 3 each 3  . nystatin (MYCOSTATIN) 100000 UNIT/ML suspension Take 5 mLs (500,000 Units total) by  mouth 4 (four) times daily. 60 mL 0  . nystatin cream (MYCOSTATIN) Apply 1 application topically 2 (two) times daily. Apply to affected area BID for up to 7 days. (Patient not taking: Reported on 07/17/2020) 30 g 0  . ondansetron (ZOFRAN) 8 MG tablet Take 1 tablet (8 mg total) by mouth every 8 (eight) hours as needed for nausea or vomiting. 30 tablet 0  . pantoprazole (PROTONIX) 40 MG tablet Take 40 mg by mouth 2 (two) times daily.    Marland Kitchen PARoxetine (PAXIL) 40 MG tablet Take 40 mg by mouth daily.     . phenazopyridine (PYRIDIUM) 100 MG tablet Take 1 tablet (100 mg total) by mouth 3 (three) times daily as needed for pain. 21 tablet 0  . potassium chloride (KLOR-CON) 10 MEQ tablet Take 2 tablets (20 mEq total) by mouth daily. 15 tablet 0  . pravastatin (PRAVACHOL) 40 MG tablet Take 40 mg by mouth at bedtime.     . predniSONE (DELTASONE) 20 MG tablet Take 1 tablet (20 mg total) by mouth daily with breakfast. 60 tablet 0  . prochlorperazine (COMPAZINE) 10 MG tablet Take 1 tablet (10 mg total) by mouth every 6 (six) hours as needed for nausea or vomiting. 30 tablet 0  . SINGULAIR 10 MG tablet Take 10 mg by mouth at bedtime.     . Vitamin D, Ergocalciferol, (DRISDOL) 50000 UNITS CAPS Take 50,000 Units by mouth  every Monday.      No current facility-administered medications for this visit.    REVIEW OF SYSTEMS:   Constitutional: ( - ) fevers, ( - )  chills , ( - ) night sweats Eyes: ( - ) blurriness of vision, ( - ) double vision, ( - ) watery eyes Ears, nose, mouth, throat, and face: ( - ) mucositis, ( - ) sore throat Respiratory: ( - ) cough, ( - ) dyspnea, ( - ) wheezes Cardiovascular: ( - ) palpitation, ( - ) chest discomfort, ( - ) lower extremity swelling Gastrointestinal:  ( - ) nausea, ( - ) heartburn, ( - ) change in bowel habits Skin: ( - ) abnormal skin rashes Lymphatics: ( - ) new lymphadenopathy, ( - ) easy bruising Neurological: ( - ) numbness, ( - ) tingling, ( - ) new weaknesses Behavioral/Psych: ( - ) mood change, ( - ) new changes  All other systems were reviewed with the patient and are negative.  PHYSICAL EXAMINATION: Phone visit. Not performed.   LABORATORY DATA:  I have reviewed the data as listed CBC Latest Ref Rng & Units 08/28/2020 08/20/2020 08/14/2020  WBC 4.0 - 10.5 K/uL 7.0 5.3 3.9(L)  Hemoglobin 12.0 - 15.0 g/dL 12.8 12.8 13.8  Hematocrit 36.0 - 46.0 % 37.8 38.8 41.0  Platelets 150 - 400 K/uL 230 175 163    CMP Latest Ref Rng & Units 08/28/2020 08/20/2020 08/14/2020  Glucose 70 - 99 mg/dL 79 83 85  BUN 8 - 23 mg/dL 11 11 8   Creatinine 0.44 - 1.00 mg/dL 0.74 0.77 0.76  Sodium 135 - 145 mmol/L 143 141 143  Potassium 3.5 - 5.1 mmol/L 3.7 3.7 3.8  Chloride 98 - 111 mmol/L 109 106 108  CO2 22 - 32 mmol/L 25 30 26   Calcium 8.9 - 10.3 mg/dL 8.8(L) 8.9 8.9  Total Protein 6.5 - 8.1 g/dL 5.7(L) 5.6(L) 6.0(L)  Total Bilirubin 0.3 - 1.2 mg/dL 0.5 0.5 0.6  Alkaline Phos 38 - 126 U/L 87 85 99  AST 15 - 41 U/L  18 20 21   ALT 0 - 44 U/L 24 25 37    No results found for: MPROTEIN No results found for: KPAFRELGTCHN, LAMBDASER, KAPLAMBRATIO   RADIOGRAPHIC STUDIES: I have personally reviewed the radiological images as listed and agreed with the findings in the  report: complete response with no residual FDG avid nodes  NM PET Image Initial (PI) Skull Base To Thigh  Result Date: 09/28/2020 CLINICAL DATA:  Subsequent treatment strategy for follicular lymphoma. EXAM: NUCLEAR MEDICINE PET SKULL BASE TO THIGH TECHNIQUE: 7.6 mCi F-18 FDG was injected intravenously. Full-ring PET imaging was performed from the skull base to thigh after the radiotracer. CT data was obtained and used for attenuation correction and anatomic localization. Fasting blood glucose: 89 mg/dl COMPARISON:  PET-CT 03/30/2020 FINDINGS: Mediastinal blood pool activity: SUV max 2.77 Liver activity: SUV max 3.66 NECK: No hypermetabolic lymph nodes in the neck. Incidental CT findings: none CHEST: No enlarged or hypermetabolic supraclavicular or axillary lymph nodes. The hypermetabolic left axillary seen on the prior study has completely resolved. No enlarged or hypermetabolic mediastinal or hilar lymph nodes. No worrisome pulmonary lesions or acute pulmonary findings. No breast lesions. Incidental CT findings: The Port-A-Cath is stable. ABDOMEN/PELVIS: The liver and spleen are unremarkable. No hypermetabolic lesions are identified. No enlarged or hypermetabolic abdominal or pelvic lymph nodes. Hypermetabolic lymphadenopathy seen on the prior study has completely resolved. The large hypermetabolic vaginal mass seen on the prior study has completely resolved. No residual measurable mass or hypermetabolism. Incidental CT findings: none SKELETON: No focal hypermetabolic activity to suggest skeletal lymphoma. Incidental CT findings: none IMPRESSION: 1. PET-CT findings consistent with a complete metabolic response to treatment ([place] 1). No enlarged or hypermetabolic lymphadenopathy in the neck, chest, abdomen or pelvis. 2. The large hypermetabolic vaginal mass has completely resolved. Electronically Signed   By: Marijo Sanes M.D.   On: 09/28/2020 11:43    ASSESSMENT & PLAN Alyssa Elliott 70 y.o.  female with medical history significant for diffuse non Hodgkin B cell lymphoma who presents for a follow up visit.  After review of the imaging, review the labs, discussion with the patient her findings are most consistent with a diffuse non-Hodgkin B-cell lymphoma, follicular lymphoma.  The initial diagnosis based off the biopsy from the lesion on the head was that of a follicle cell lymphoma which tends to be a cutaneous lymphoma, however this diagnosis was considerably less likely given the diffuse spread throughout the lymph nodes of the chest and abdomen. Biopsy of the FDG avid lesion in the pelvis resulted with follicular lymphoma.   Previously we discussed the diagnosis of follicular lymphoma and the options moving forward.  She currently does not meet any GELF Criteria and therefore is not required to start therapy.  We discussed that treatment could be started if she so desired and we discussed the risks and benefits of bendamustine rituximab therapy.  She voiced her understanding of the different options between starting treatment and continued observation.  After discussion with her husband she noted she would like to start treatment given the continued growth of the lesion which was abutting the urinary bladder.   The regimen of choice for this patient is Rituximab monotherapy with Rituximab 375mg /m2 IV weekly x 4 weeks, followed by IV rituximab q 8 weeks thereafter. This is being administered with curative intent.  In addition to this regimen from bendamustine rituximab due to intolerance and hospitalization following her first cycle of treatment.  # Non-Hodgkin B Cell Lymphoma, Follicular Lymphoma. Stage  III --pre treatment CT findings showed lymphadenopathy on both sides of the diaphragm. Consistent with at least a Stage III lymphoma -- confirmed the diagnosis with biopsy of the FDG avid mass near the uterus/cervix. Cancel the left axillary lymph node biopsy --started treatment with R-Benda,  however the patient ended up hospitalized due to fever, weakness, and marked drop in her labs.  She did not tolerate rituximab and bendamustine combination altogether and therefore we proceeded with rituximab monotherapy alone. She completed Rituximab weekly x 4 weeks. Will soon start q 8 week rituximab maintenance  --completed chemotherapy education, port placement. Supportive care (noted below)   --RTC for next rituximab infusion appointment (10/23/2020)   #Fibrosing Mesenteritis (Mesenteric Panniculitis), improving --improved with prednisone 40mg  PO daily  --now that rituximab therapy is complete can begain weaning the dose --recommend 20mg  PO daily x 2 weeks then finish with 10mg  PO x 7 days  --reassess at next visit.   #Supportive Care --Chemotherapy Education to be scheduled  --zofran 8mg  q8H PRN and compazine 10mg  PO q6H for nausea -- EMLA cream for port -- no pain medication required at this time.   No orders of the defined types were placed in this encounter.  All questions were answered. The patient knows to call the clinic with any problems, questions or concerns.  A total of more than 30 minutes were spent on this encounter and over half of that time was spent on counseling and coordination of care as outlined above.   Ledell Peoples, MD Department of Hematology/Oncology [place] [facility] Phone: [phone] Pager: [phone] Email: [email or web address]  10/05/2020 3:52 PM

## 2020-10-08 DIAGNOSIS — E559 Vitamin D deficiency, unspecified: Secondary | ICD-10-CM | POA: Diagnosis not present

## 2020-10-08 DIAGNOSIS — E785 Hyperlipidemia, unspecified: Secondary | ICD-10-CM | POA: Diagnosis not present

## 2020-10-09 ENCOUNTER — Encounter: Primary: Family

## 2020-10-12 ENCOUNTER — Telehealth: Payer: Self-pay | Admitting: *Deleted

## 2020-10-12 ENCOUNTER — Other Ambulatory Visit: Payer: Self-pay | Admitting: Hematology and Oncology

## 2020-10-12 ENCOUNTER — Telehealth: Payer: Self-pay | Admitting: Hematology and Oncology

## 2020-10-12 MED ORDER — PREDNISONE 5 MG PO TABS: ORAL_TABLET | ORAL | 0 refills | Status: DC

## 2020-10-12 NOTE — Telephone Encounter (Signed)
Received vm message from patient inquiring about her tapering off her Prednisone. She has one 20mg  tablet left. She would like to know the plan for tapering her off. She believes she needs 10mg  tablets next.   Please advise

## 2020-10-12 NOTE — Telephone Encounter (Signed)
[name] patient and advised that Dr. Lorenso Courier has sent ina new prescription for prednisone 5mg  tabs. She is to take 2 tabs daily for 14 days, then 1 tablet daily for another 14 days, then stop the prednisone.  Alyssa Elliott voiced understanding and will pick up the new prescription this afternoon.

## 2020-10-12 NOTE — Telephone Encounter (Signed)
Called to inform patient of her upcoming appointment. Patient is aware. 

## 2020-10-13 NOTE — ACP (Advance Care Planning) (Signed)
Advance Care Planning   Ambulatory ACP Specialist Patient Outreach    Date:  10/13/2020    ACP Specialist:  Elouise Munroe, LCSW    Outreach call to patient in follow-up to ACP Specialist referral from:    [x]  PCP  []  Provider   []  Ambulatory Care Management []  Other     For:                  [x]  Advance Directive Assistance              []  Complete Portable DNR order              []  Complete POST/MOST              []  Code Status Discussion             []  Discuss Goals of Care             []  Early ACP Decision-Making              []  Other (Specify)    Date Referral Received:08/24/2020    Today's Outreach:  []  First   []  Second  [x]  Third       Third outreach made by: []  Phone  []  Email / mail    []  MyChart     Intervention:  [x]  Spoke with Patient   []  Left VM requesting return call      Outcome: ACP reminder call made       Next Step:   [x]  ACP scheduled conversation  []  Outreach again in one week               []  Email / Mail ACP Ecologist / Mail Advance Directive   []  Closing referral.  Routing closure to referring provider/staff and to Lexicographer.    []  Closure letter mailed to patient with invitation to contact ACP Specialist if / when ready.Thank you for this referral.        Elouise Munroe, LCSW

## 2020-10-14 DIAGNOSIS — M7502 Adhesive capsulitis of left shoulder: Secondary | ICD-10-CM | POA: Diagnosis not present

## 2020-10-14 DIAGNOSIS — Z0000 Encounter for general adult medical examination without abnormal findings: Secondary | ICD-10-CM | POA: Diagnosis not present

## 2020-10-14 DIAGNOSIS — J309 Allergic rhinitis, unspecified: Secondary | ICD-10-CM | POA: Diagnosis not present

## 2020-10-14 DIAGNOSIS — E559 Vitamin D deficiency, unspecified: Secondary | ICD-10-CM | POA: Diagnosis not present

## 2020-10-14 DIAGNOSIS — F329 Major depressive disorder, single episode, unspecified: Secondary | ICD-10-CM | POA: Diagnosis not present

## 2020-10-14 DIAGNOSIS — M5030 Other cervical disc degeneration, unspecified cervical region: Secondary | ICD-10-CM | POA: Diagnosis not present

## 2020-10-14 DIAGNOSIS — E785 Hyperlipidemia, unspecified: Secondary | ICD-10-CM | POA: Diagnosis not present

## 2020-10-14 DIAGNOSIS — J45909 Unspecified asthma, uncomplicated: Secondary | ICD-10-CM | POA: Diagnosis not present

## 2020-10-14 DIAGNOSIS — D126 Benign neoplasm of colon, unspecified: Secondary | ICD-10-CM | POA: Diagnosis not present

## 2020-10-14 DIAGNOSIS — R82998 Other abnormal findings in urine: Secondary | ICD-10-CM | POA: Diagnosis not present

## 2020-10-14 DIAGNOSIS — C8253 Diffuse follicle center lymphoma, intra-abdominal lymph nodes: Secondary | ICD-10-CM | POA: Diagnosis not present

## 2020-10-14 NOTE — ACP (Advance Care Planning) (Signed)
 Advance Care Planning     Advance Care Planning Clinical Specialist  Conversation Note      Date of ACP Conversation: 10/14/20    Conversation Conducted with:  Patient with Decision Making Capacity    ACP Clinical Specialist: Elijah LOISE Side, LCSW      Health Care Decision Maker:    Current Designated Health Care Decision Maker:     Primary Decision Maker: Lindsey Huynh Child [phone]      Care Preferences    Hospitalization:  If your health worsens and it becomes clear that your chance of recovery is unlikely, what would your preference be regarding hospitalization?    Choice:  [x]   The patient wants hospitalization  []   The patient prefers comfort-focused treatment without hospitalization.    Ventilation:  If you were in your present state of health and suddenly became very ill and were unable to breathe on your own, what would your preference be about the use of a ventilator (breathing machine) if it were available to you?      If patient would desire the use of a ventilator (breathing machine), answer yes, if not no:yes    If your health worsens and it becomes clear that your chance of recovery is unlikely, what would your preference be about the use of a ventilator (breathing machine) if it were available to you?     Would the patient desire the use of a ventilator (breathing machine)?  YES      Resuscitation  CPR works best to restart the heart when there is a sudden event, like a heart attack, in someone who is otherwise healthy. Unfortunately, CPR does not typically restart the heart for people who have serious health conditions or who are very sick.    In the event your heart stopped as a result of an underlying serious health condition, would you want attempts to be made to restart your heart (answer yes for attempt to resuscitate) or would you prefer a natural death (answer no for do not attempt to resuscitate)? yes      [x]  Yes  []  No   Educated Patient / Decision Maker  regarding differences between Advance Directives and portable DNR orders.    Length of ACP Conversation in minutes:  30 minutes.     Conversation Outcomes:  [x]  ACP discussion completed  []  Existing advance directive reviewed with patient; no changes to patient's previously recorded wishes   [x]  New Advance Directive completed: will mail to pt for signatures.  Once signed copy is received, it will be scanned into chart.  11/03/2020: Pt received the completed AMD to sign. Explained again the process of signing with witnesses.   []  Portable Do Not Resuscitate prepared for Provider review and signature  []  POLST/POST/MOLST/MOST prepared for Provider review and signature      Follow-up plan:    []  Schedule follow-up conversation to continue planning  []  Referred individual to Provider for additional questions/concerns   [x]  Advised patient/agent/surrogate to review completed ACP document and update if needed with changes in condition, patient preferences or care setting     [x]  This note routed to one or more involved healthcare providers    Elijah LOISE Side, LCSW

## 2020-10-20 ENCOUNTER — Other Ambulatory Visit: Payer: Self-pay | Admitting: Hematology and Oncology

## 2020-10-21 NOTE — Telephone Encounter (Signed)
Refill request. We see her on Friday, 10/23/20

## 2020-10-23 ENCOUNTER — Other Ambulatory Visit: Payer: Self-pay | Admitting: Hematology and Oncology

## 2020-10-23 ENCOUNTER — Inpatient Hospital Stay: Payer: Medicare Other

## 2020-10-23 ENCOUNTER — Inpatient Hospital Stay: Payer: Medicare Other | Attending: Hematology and Oncology

## 2020-10-23 ENCOUNTER — Inpatient Hospital Stay (HOSPITAL_BASED_OUTPATIENT_CLINIC_OR_DEPARTMENT_OTHER): Payer: Medicare Other | Admitting: Hematology and Oncology

## 2020-10-23 ENCOUNTER — Other Ambulatory Visit: Payer: Self-pay

## 2020-10-23 VITALS — BP 124/82 | HR 78 | Temp 97.7°F | Resp 18 | Ht 64.0 in | Wt 158.5 lb

## 2020-10-23 VITALS — BP 121/68 | HR 71 | Temp 99.0°F | Resp 18

## 2020-10-23 DIAGNOSIS — Z7982 Long term (current) use of aspirin: Secondary | ICD-10-CM | POA: Diagnosis not present

## 2020-10-23 DIAGNOSIS — K649 Unspecified hemorrhoids: Secondary | ICD-10-CM | POA: Insufficient documentation

## 2020-10-23 DIAGNOSIS — E559 Vitamin D deficiency, unspecified: Secondary | ICD-10-CM | POA: Diagnosis not present

## 2020-10-23 DIAGNOSIS — C8221 Follicular lymphoma grade III, unspecified, lymph nodes of head, face, and neck: Secondary | ICD-10-CM | POA: Diagnosis not present

## 2020-10-23 DIAGNOSIS — C8256 Diffuse follicle center lymphoma, intrapelvic lymph nodes: Secondary | ICD-10-CM

## 2020-10-23 DIAGNOSIS — K654 Sclerosing mesenteritis: Secondary | ICD-10-CM | POA: Insufficient documentation

## 2020-10-23 DIAGNOSIS — Z79899 Other long term (current) drug therapy: Secondary | ICD-10-CM | POA: Insufficient documentation

## 2020-10-23 DIAGNOSIS — E785 Hyperlipidemia, unspecified: Secondary | ICD-10-CM | POA: Diagnosis not present

## 2020-10-23 DIAGNOSIS — C8253 Diffuse follicle center lymphoma, intra-abdominal lymph nodes: Secondary | ICD-10-CM

## 2020-10-23 DIAGNOSIS — Z803 Family history of malignant neoplasm of breast: Secondary | ICD-10-CM | POA: Diagnosis not present

## 2020-10-23 DIAGNOSIS — K219 Gastro-esophageal reflux disease without esophagitis: Secondary | ICD-10-CM | POA: Diagnosis not present

## 2020-10-23 DIAGNOSIS — Z5112 Encounter for antineoplastic immunotherapy: Secondary | ICD-10-CM | POA: Insufficient documentation

## 2020-10-23 DIAGNOSIS — F418 Other specified anxiety disorders: Secondary | ICD-10-CM | POA: Diagnosis not present

## 2020-10-23 DIAGNOSIS — J45909 Unspecified asthma, uncomplicated: Secondary | ICD-10-CM | POA: Insufficient documentation

## 2020-10-23 DIAGNOSIS — Z95828 Presence of other vascular implants and grafts: Secondary | ICD-10-CM

## 2020-10-23 LAB — CMP (CANCER CENTER ONLY)
ALT: 30 U/L (ref 0–44)
AST: 20 U/L (ref 15–41)
Albumin: 3.3 g/dL — ABNORMAL LOW (ref 3.5–5.0)
Alkaline Phosphatase: 95 U/L (ref 38–126)
Anion gap: 8 (ref 5–15)
BUN: 9 mg/dL (ref 8–23)
CO2: 26 mmol/L (ref 22–32)
Calcium: 8.8 mg/dL — ABNORMAL LOW (ref 8.9–10.3)
Chloride: 108 mmol/L (ref 98–111)
Creatinine: 0.77 mg/dL (ref 0.44–1.00)
GFR, Estimated: >60 mL/min (ref >60)
Glucose, Bld: 97 mg/dL (ref 70–99)
Potassium: 3.9 mmol/L (ref 3.5–5.1)
Sodium: 142 mmol/L (ref 135–145)
Total Bilirubin: 0.4 mg/dL (ref 0.3–1.2)
Total Protein: 5.9 g/dL — ABNORMAL LOW (ref 6.5–8.1)

## 2020-10-23 LAB — CBC WITH DIFFERENTIAL (CANCER CENTER ONLY)
Abs Immature Granulocytes: 0.2 10*3/uL — ABNORMAL HIGH (ref 0.00–0.07)
Basophils Absolute: 0.1 10*3/uL (ref 0.0–0.1)
Basophils Relative: 1 %
Eosinophils Absolute: 0.1 10*3/uL (ref 0.0–0.5)
Eosinophils Relative: 1 %
HCT: 42.9 % (ref 36.0–46.0)
Hemoglobin: 14.1 g/dL (ref 12.0–15.0)
Immature Granulocytes: 3 %
Lymphocytes Relative: 8 %
Lymphs Abs: 0.7 10*3/uL (ref 0.7–4.0)
MCH: 31.9 pg (ref 26.0–34.0)
MCHC: 32.9 g/dL (ref 30.0–36.0)
MCV: 97.1 fL (ref 80.0–100.0)
Monocytes Absolute: 0.6 10*3/uL (ref 0.1–1.0)
Monocytes Relative: 8 %
Neutro Abs: 6.3 10*3/uL (ref 1.7–7.7)
Neutrophils Relative %: 79 %
Platelet Count: 191 10*3/uL (ref 150–400)
RBC: 4.42 MIL/uL (ref 3.87–5.11)
RDW: 12.9 % (ref 11.5–15.5)
WBC Count: 7.9 10*3/uL (ref 4.0–10.5)
nRBC: 0 % (ref 0.0–0.2)

## 2020-10-23 LAB — LACTATE DEHYDROGENASE: LDH: 256 U/L — ABNORMAL HIGH (ref 98–192)

## 2020-10-23 MED ORDER — SODIUM CHLORIDE 0.9% FLUSH
10.0000 mL | INTRAVENOUS | Status: DC | PRN
Start: 2020-10-23 — End: 2020-10-23
  Administered 2020-10-23: 10 mL
  Filled 2020-10-23: qty 10

## 2020-10-23 MED ORDER — SODIUM CHLORIDE 0.9 % IV SOLN
Freq: Once | INTRAVENOUS | Status: AC
  Filled 2020-10-23: qty 250

## 2020-10-23 MED ORDER — DIPHENHYDRAMINE HCL 25 MG PO CAPS
ORAL_CAPSULE | ORAL | Status: AC
  Filled 2020-10-23: qty 2

## 2020-10-23 MED ORDER — ACETAMINOPHEN 325 MG PO TABS
ORAL_TABLET | ORAL | Status: AC
  Filled 2020-10-23: qty 2

## 2020-10-23 MED ORDER — HEPARIN SOD (PORK) LOCK FLUSH 100 UNIT/ML IV SOLN
500.0000 [IU] | Freq: Once | INTRAVENOUS | Status: AC | PRN
  Administered 2020-10-23: 500 [IU]
  Filled 2020-10-23: qty 5

## 2020-10-23 MED ORDER — ACETAMINOPHEN 325 MG PO TABS
650.0000 mg | ORAL_TABLET | Freq: Once | ORAL | Status: AC
  Administered 2020-10-23: 650 mg via ORAL

## 2020-10-23 MED ORDER — ALTEPLASE 2 MG IJ SOLR
2.0000 mg | Freq: Once | INTRAMUSCULAR | Status: AC
  Administered 2020-10-23: 2 mg
  Filled 2020-10-23: qty 2

## 2020-10-23 MED ORDER — ALTEPLASE 2 MG IJ SOLR
INTRAMUSCULAR | Status: AC
  Filled 2020-10-23: qty 2

## 2020-10-23 MED ORDER — SODIUM CHLORIDE 0.9 % IV SOLN
375.0000 mg/m2 | Freq: Once | INTRAVENOUS | Status: AC
  Administered 2020-10-23: 700 mg via INTRAVENOUS
  Filled 2020-10-23: qty 50

## 2020-10-23 MED ORDER — DIPHENHYDRAMINE HCL 25 MG PO CAPS
50.0000 mg | ORAL_CAPSULE | Freq: Once | ORAL | Status: AC
Start: 2020-10-23 — End: 2020-10-23
  Administered 2020-10-23: 50 mg via ORAL

## 2020-10-23 NOTE — Patient Instructions (Signed)
[facility] °Discharge Instructions for Patients Receiving Chemotherapy ° °Today you received the following chemotherapy agents: Rituximab ° °To help prevent nausea and vomiting after your treatment, we encourage you to take your nausea medication as directed.  °  °If you develop nausea and vomiting that is not controlled by your nausea medication, call the clinic.  ° °BELOW ARE SYMPTOMS THAT SHOULD BE REPORTED IMMEDIATELY: °· *FEVER GREATER THAN 100.5 F °· *CHILLS WITH OR WITHOUT FEVER °· NAUSEA AND VOMITING THAT IS NOT CONTROLLED WITH YOUR NAUSEA MEDICATION °· *UNUSUAL SHORTNESS OF BREATH °· *UNUSUAL BRUISING OR BLEEDING °· TENDERNESS IN MOUTH AND THROAT WITH OR WITHOUT PRESENCE OF ULCERS °· *URINARY PROBLEMS °· *BOWEL PROBLEMS °· UNUSUAL RASH °Items with * indicate a potential emergency and should be followed up as soon as possible. ° °Feel free to call the clinic should you have any questions or concerns. The clinic phone number is [phone]. ° °Please show the CHEMO ALERT CARD at check-in to the Emergency Department and triage nurse. ° ° °

## 2020-10-23 NOTE — Progress Notes (Signed)
[place] [phone]   [phone]  PROGRESS NOTE  Patient Care Team: Prince Solian, MD as PCP - General (Internal Medicine) Mcarthur Rossetti, MD as Consulting Physician (Orthopedic Surgery)  Hematological/Oncological History # Non-Hodgkin B Cell Lymphoma, Follicular Lymphoma. Stage III 1) 02/25/2020: patient underwent excision of a 1.3 cm mobile nodule on the left vertex of the scalp. 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  2) 03/18/2020: Establish care with Dr. Lorenso Courier  3) 03/30/2020: PET CT scan showed  involvement of the left scalp, left axillary lymph node, left hilar and infrahilar nodes, subcarinal node, and mesenteric lymph nodes, as well as a masslike appearance/ high metabolic activity in the lower uterus/cervix.  4) 04/21/2020: biopsy of the mass in the lower uterus/cervix consistent with follicular lymphoma.  5) 06/25/2020: CT C/A/P showed the soft tissue mass in the abdomen increase in size from 5.8 x 3.6 cm to 6.5 x 5.7 cm. The other lymphadenopathy was stable. Patient elected to move forward with treatment.  6) 07/06/2020: Cycle 1 Day 1 of Rituximab/Bendamustine 7) 07/17/2020-07/22/2020: Admitted to [facility] with fevers. Infectious workup negative, but scans show mesenteric panniculitis (fibrosing mesenteritis). Started on prednisone 40mg  PO daily.  8) 08/06/2020-08/27/2020: Weekly Rituximab monotherapy.  9) 09/28/2020: PET CT scan shows no residual disease. Complete response to therapy noted.  10) 10/23/2020: start of rituximab q 8 weeks for consolidation.   Interval History:  Alyssa Elliott 70 y.o. female with medical history significant for diffuse non Hodgkin B cell lymphoma who presents for a follow up visit. The patient's last visit was on 10/05/2020. In the interim since her last visit she had a PET CT scan which showed a complete response to therapy with no  residual FDG avid disease.  On exam today Alyssa Elliott is accompanied by her husband.  She reports that she has been well in the interim since her last visit.  She has been de-escalating on the steroids and notes that her energy level is good and her appetite is also been good.  She denies having any issues with fevers, chills, sweats, nausea, or diarrhea.  She has had no new lymphadenopathy or swollen lymph nodes.  She reports that she is still being careful doing her best to protect herself from the virus.  She otherwise has no, questions or concerns.  A full 10 point ROS is listed below.  MEDICAL HISTORY:  Past Medical History:  Diagnosis Date  . Allergy   . Anxiety   . Asthma   . Cataract 2018   removed  . Complication of anesthesia    drop in BP  . DDD (degenerative disc disease)   . Depression   . GERD (gastroesophageal reflux disease)   . Hemorrhoids   . Hyperlipidemia   . Lymphoma ([place]) dx 2021   no tx for now  . Vitamin D deficiency     SURGICAL HISTORY: Past Surgical History:  Procedure Laterality Date  . ATRIAL ABLATION SURGERY  1997   SVT  . cataract surgery Bilateral 2017  . COLONOSCOPY  2016  . FOOT NEUROMA SURGERY     multiple times bilaterally  . IR IMAGING GUIDED PORT INSERTION  07/13/2020  . PLANTAR FASCIA RELEASE Left 1999 or 2000  . SHOULDER ARTHROSCOPY Left 04/19/2019   Procedure: LEFT SHOULDER MANIPULATION UNDER ANESTHESIA AND ARTHROSCOPY WITH EXTENSIVE DEBRIDEMENT;  Surgeon: Mcarthur Rossetti, MD;  Location: [facility];  Service: Orthopedics;  Laterality: Left;  . UPPER  GI ENDOSCOPY  yrs ago    SOCIAL HISTORY: Social History   Socioeconomic History  . Marital status: Married    Spouse name: Not on file  . Number of children: 0  . Years of education: Not on file  . Highest education level: Not on file  Occupational History  . Occupation: retired Catering manager  Tobacco Use  . Smoking status: Never Smoker  . Smokeless tobacco: Never Used   Vaping Use  . Vaping Use: Never used  Substance and Sexual Activity  . Alcohol use: No  . Drug use: No  . Sexual activity: Not Currently    Partners: Male    Birth control/protection: Post-menopausal  Other Topics Concern  . Not on file  Social History Narrative  . Not on file   Social Determinants of Health   Financial Resource Strain: Not on file  Food Insecurity: Not on file  Transportation Needs: Not on file  Physical Activity: Not on file  Stress: Not on file  Social Connections: Not on file  Intimate Partner Violence: Not on file    FAMILY HISTORY: Family History  Problem Relation Age of Onset  . Heart disease Mother   . Diabetes Mother   . Tuberculosis Paternal Grandfather   . Bone cancer Paternal Grandfather   . Breast cancer Sister 67  . Melanoma Sister   . Cirrhosis Maternal Grandfather   . Heart failure Paternal Grandmother   . Colon cancer Neg Hx   . Ovarian cancer Neg Hx   . Uterine cancer Neg Hx     ALLERGIES:  is allergic to codeine, penicillins, keflex [cephalexin], latex, and sulfa antibiotics.  MEDICATIONS:  Current Outpatient Medications  Medication Sig Dispense Refill  . acetaminophen (TYLENOL) 500 MG tablet Take 1,000 mg by mouth every 6 (six) hours as needed for moderate pain or headache.    Marland Kitchen acyclovir (ZOVIRAX) 400 MG tablet Take 1 tablet (400 mg total) by mouth 2 (two) times daily. 180 tablet 1  . allopurinol (ZYLOPRIM) 300 MG tablet Take 1 tablet (300 mg total) by mouth daily. 90 tablet 1  . ARIPiprazole (ABILIFY) 2 MG tablet Take 2 mg by mouth daily.     Marland Kitchen aspirin 81 MG tablet Take 81 mg by mouth at bedtime.     . [employer] FIBER GUMMIES PO Take 2 each by mouth daily.     Marland Kitchen EPINEPHrine 0.3 mg/0.3 mL IJ SOAJ injection Inject 0.3 mg into the muscle as needed for anaphylaxis.     Marland Kitchen GLUCOSAMINE-CHONDROITIN-MSM PO Take 1 tablet by mouth 2 (two) times a day.    . levalbuterol (XOPENEX HFA) 45 MCG/ACT inhaler Inhale 1 puff into the lungs every 4  (four) hours as needed for wheezing or shortness of breath. (Patient not taking: Reported on 07/17/2020) 1 Inhaler 3  . Melatonin 5 MG TABS Take 20 mg by mouth at bedtime.     . NONFORMULARY OR COMPOUNDED ITEM Estradiol 0.02% Insert 1 gram vaginally 2 times per week. 3 month supply with 3 refills. (Patient not taking: Reported on 07/17/2020) 3 each 3  . nystatin (MYCOSTATIN) 100000 UNIT/ML suspension Take 5 mLs (500,000 Units total) by mouth 4 (four) times daily. 60 mL 0  . nystatin cream (MYCOSTATIN) Apply 1 application topically 2 (two) times daily. Apply to affected area BID for up to 7 days. (Patient not taking: Reported on 07/17/2020) 30 g 0  . ondansetron (ZOFRAN) 8 MG tablet Take 1 tablet (8 mg total) by mouth every 8 (eight)  hours as needed for nausea or vomiting. 30 tablet 0  . pantoprazole (PROTONIX) 40 MG tablet Take 40 mg by mouth 2 (two) times daily.    Marland Kitchen PARoxetine (PAXIL) 40 MG tablet Take 40 mg by mouth daily.     . phenazopyridine (PYRIDIUM) 100 MG tablet Take 1 tablet (100 mg total) by mouth 3 (three) times daily as needed for pain. 21 tablet 0  . potassium chloride (KLOR-CON) 10 MEQ tablet Take 2 tablets (20 mEq total) by mouth daily. 15 tablet 0  . pravastatin (PRAVACHOL) 40 MG tablet Take 40 mg by mouth at bedtime.     . predniSONE (DELTASONE) 5 MG tablet Take 2 tablets (10 mg) for 14 days, followed by 1 tablet (5 mg) for an additional 14 days. 42 tablet 0  . prochlorperazine (COMPAZINE) 10 MG tablet Take 1 tablet (10 mg total) by mouth every 6 (six) hours as needed for nausea or vomiting. 30 tablet 0  . SINGULAIR 10 MG tablet Take 10 mg by mouth at bedtime.     . Vitamin D, Ergocalciferol, (DRISDOL) 50000 UNITS CAPS Take 50,000 Units by mouth every Monday.      No current facility-administered medications for this visit.    REVIEW OF SYSTEMS:   Constitutional: ( - ) fevers, ( - )  chills , ( - ) night sweats Eyes: ( - ) blurriness of vision, ( - ) double vision, ( - )  watery eyes Ears, nose, mouth, throat, and face: ( - ) mucositis, ( - ) sore throat Respiratory: ( - ) cough, ( - ) dyspnea, ( - ) wheezes Cardiovascular: ( - ) palpitation, ( - ) chest discomfort, ( - ) lower extremity swelling Gastrointestinal:  ( - ) nausea, ( - ) heartburn, ( - ) change in bowel habits Skin: ( - ) abnormal skin rashes Lymphatics: ( - ) new lymphadenopathy, ( - ) easy bruising Neurological: ( - ) numbness, ( - ) tingling, ( - ) new weaknesses Behavioral/Psych: ( - ) mood change, ( - ) new changes  All other systems were reviewed with the patient and are negative.  PHYSICAL EXAMINATION: GENERAL: well appearing elderly Caucasian female. alert, no distress and comfortable SKIN: scalp lesion reduced to a concave dent in scalp. skin color, texture, turgor are normal, no rashes or significant lesions EYES: conjunctiva are pink and non-injected, sclera clear LYMPH:  no palpable lymphadenopathy in the cervical, axillary or supraclavicular LUNGS: clear to auscultation and percussion with normal breathing effort HEART: regular rate & rhythm and no murmurs and no lower extremity edema Musculoskeletal: no cyanosis of digits and no clubbing  PSYCH: alert & oriented x 3, fluent speech NEURO: no focal motor/sensory deficits  LABORATORY DATA:  I have reviewed the data as listed CBC Latest Ref Rng & Units 08/28/2020 08/20/2020 08/14/2020  WBC 4.0 - 10.5 K/uL 7.0 5.3 3.9(L)  Hemoglobin 12.0 - 15.0 g/dL 12.8 12.8 13.8  Hematocrit 36.0 - 46.0 % 37.8 38.8 41.0  Platelets 150 - 400 K/uL 230 175 163    CMP Latest Ref Rng & Units 08/28/2020 08/20/2020 08/14/2020  Glucose 70 - 99 mg/dL 79 83 85  BUN 8 - 23 mg/dL 11 11 8   Creatinine 0.44 - 1.00 mg/dL 0.74 0.77 0.76  Sodium 135 - 145 mmol/L 143 141 143  Potassium 3.5 - 5.1 mmol/L 3.7 3.7 3.8  Chloride 98 - 111 mmol/L 109 106 108  CO2 22 - 32 mmol/L 25 30 26   Calcium 8.9 -  10.3 mg/dL 8.8(L) 8.9 8.9  Total Protein 6.5 - 8.1 g/dL 5.7(L)  5.6(L) 6.0(L)  Total Bilirubin 0.3 - 1.2 mg/dL 0.5 0.5 0.6  Alkaline Phos 38 - 126 U/L 87 85 99  AST 15 - 41 U/L 18 20 21   ALT 0 - 44 U/L 24 25 37    No results found for: MPROTEIN No results found for: KPAFRELGTCHN, LAMBDASER, KAPLAMBRATIO   RADIOGRAPHIC STUDIES: I have personally reviewed the radiological images as listed and agreed with the findings in the report: complete response with no residual FDG avid nodes  NM PET Image Initial (PI) Skull Base To Thigh  Result Date: 09/28/2020 CLINICAL DATA:  Subsequent treatment strategy for follicular lymphoma. EXAM: NUCLEAR MEDICINE PET SKULL BASE TO THIGH TECHNIQUE: 7.6 mCi F-18 FDG was injected intravenously. Full-ring PET imaging was performed from the skull base to thigh after the radiotracer. CT data was obtained and used for attenuation correction and anatomic localization. Fasting blood glucose: 89 mg/dl COMPARISON:  PET-CT 03/30/2020 FINDINGS: Mediastinal blood pool activity: SUV max 2.77 Liver activity: SUV max 3.66 NECK: No hypermetabolic lymph nodes in the neck. Incidental CT findings: none CHEST: No enlarged or hypermetabolic supraclavicular or axillary lymph nodes. The hypermetabolic left axillary seen on the prior study has completely resolved. No enlarged or hypermetabolic mediastinal or hilar lymph nodes. No worrisome pulmonary lesions or acute pulmonary findings. No breast lesions. Incidental CT findings: The Port-A-Cath is stable. ABDOMEN/PELVIS: The liver and spleen are unremarkable. No hypermetabolic lesions are identified. No enlarged or hypermetabolic abdominal or pelvic lymph nodes. Hypermetabolic lymphadenopathy seen on the prior study has completely resolved. The large hypermetabolic vaginal mass seen on the prior study has completely resolved. No residual measurable mass or hypermetabolism. Incidental CT findings: none SKELETON: No focal hypermetabolic activity to suggest skeletal lymphoma. Incidental CT findings: none  IMPRESSION: 1. PET-CT findings consistent with a complete metabolic response to treatment ([place] 1). No enlarged or hypermetabolic lymphadenopathy in the neck, chest, abdomen or pelvis. 2. The large hypermetabolic vaginal mass has completely resolved. Electronically Signed   By: Marijo Sanes M.D.   On: 09/28/2020 11:43    ASSESSMENT & PLAN Alyssa Elliott 70 y.o. female with medical history significant for diffuse non Hodgkin B cell lymphoma who presents for a follow up visit.  After review of the imaging, review the labs, discussion with the patient her findings are most consistent with a diffuse non-Hodgkin B-cell lymphoma, follicular lymphoma.  The initial diagnosis based off the biopsy from the lesion on the head was that of a follicle cell lymphoma which tends to be a cutaneous lymphoma, however this diagnosis was considerably less likely given the diffuse spread throughout the lymph nodes of the chest and abdomen. Biopsy of the FDG avid lesion in the pelvis resulted with follicular lymphoma.   Previously we discussed the diagnosis of follicular lymphoma and the options moving forward.  She currently does not meet any GELF Criteria and therefore is not required to start therapy.  We discussed that treatment could be started if she so desired and we discussed the risks and benefits of bendamustine rituximab therapy.  She voiced her understanding of the different options between starting treatment and continued observation.  After discussion with her husband she noted she would like to start treatment given the continued growth of the lesion which was abutting the urinary bladder.   The regimen of choice for this patient is Rituximab monotherapy with Rituximab 375mg /m2 IV weekly x 4 weeks, followed by IV  rituximab q 8 weeks thereafter. This is being administered with curative intent. We transitioned to this regimen from bendamustine rituximab due to intolerance and hospitalization following her  first cycle of treatment.  # Non-Hodgkin B Cell Lymphoma, Follicular Lymphoma. Stage III --pre treatment CT findings showed lymphadenopathy on both sides of the diaphragm. Consistent with at least a Stage III lymphoma -- confirmed the diagnosis with biopsy of the FDG avid mass near the uterus/cervix. Cancel the left axillary lymph node biopsy --started treatment with R-Benda, however the patient ended up hospitalized due to fever, weakness, and marked drop in her labs.  She did not tolerate rituximab and bendamustine combination altogether and therefore we proceeded with rituximab monotherapy alone. She completed Rituximab weekly x 4 weeks. Will start q 8 week rituximab maintenance today --CT imaging recommend q 6 months x 2 years. First surviellance CT scan due 03/2021 --completed chemotherapy education, port placement. Supportive care (noted below)   --RTC for next rituximab infusion appointment (12/18/2020)   #Fibrosing Mesenteritis (Mesenteric Panniculitis), improving --improved with prednisone taper, complete resolution of symptoms.  --recommend 10mg  PO daily until Wednesday, then 5 mg PO x 14 days and d/c.  --continue to monitor   #Supportive Care --Chemotherapy Education to be scheduled  --zofran 8mg  q8H PRN and compazine 10mg  PO q6H for nausea -- EMLA cream for port -- no pain medication required at this time.   No orders of the defined types were placed in this encounter.  All questions were answered. The patient knows to call the clinic with any problems, questions or concerns.  A total of more than 30 minutes were spent on this encounter and over half of that time was spent on counseling and coordination of care as outlined above.   Ledell Peoples, MD Department of Hematology/Oncology [place] [facility] Phone: [phone] Pager: [phone] Email: [email or web address]  10/23/2020 7:28 AM

## 2020-10-23 NOTE — Progress Notes (Signed)
Unable to get blood return on patient's port-a-cath this morning. Cath-flo instilled at 0805. Patient requested to hold off on having peripheral labs drawn at this time.

## 2020-10-26 ENCOUNTER — Telehealth: Payer: Self-pay | Admitting: Hematology and Oncology

## 2020-10-26 NOTE — Telephone Encounter (Signed)
Scheduled appts per 2/4 los. Left voicemail with appt date and time.  

## 2020-10-28 ENCOUNTER — Ambulatory Visit: Primary: Family

## 2020-10-30 ENCOUNTER — Encounter

## 2020-10-30 MED ORDER — LEVEMIR FLEXTOUCH U-100 INSULIN 100 UNIT/ML (3 ML) SUBCUTANEOUS PEN
100 unit/mL (3 mL) | SUBCUTANEOUS | 5 refills | Status: DC
Start: 2020-10-30 — End: 2020-11-19

## 2020-10-30 MED ORDER — METFORMIN SR 500 MG 24 HR TABLET
500 mg | ORAL_TABLET | ORAL | 3 refills | Status: DC
Start: 2020-10-30 — End: 2021-11-10

## 2020-11-04 NOTE — Telephone Encounter (Signed)
Pt is going to drop office her yearly DMV paperwork to be completed by provider. No further actions required at this time.

## 2020-11-04 NOTE — Telephone Encounter (Signed)
-----   Message from Sherald Barge sent at 11/02/2020  9:56 AM EST -----  Subject: Message to Provider    QUESTIONS  Information for Provider? APPT SCHEDULED 11/20/20, HOS F/U , HAD COVID,   NEEDED CPR, STAY AT HOME UNTIL 11/06/20--- PATIENT WANTS CALLS BACK, HAS   PAPERS THAT NEED TO BE FILLED OUT FOR D&V DUE BY 11/16/20  ---------------------------------------------------------------------------  --------------  CALL BACK INFO  What is the best way for the office to contact you? OK to leave message on   voicemail  Preferred Call Back Phone Number? [phone]  ---------------------------------------------------------------------------  --------------  SCRIPT ANSWERS  Relationship to Patient? Self

## 2020-11-06 NOTE — Telephone Encounter (Signed)
[facility] is requesting new prescriptions on behalf of the pt via fax. Please review and prescribe if appropriate.       Last visit 08/21/2020 NP Ivey   Next appointment 11/19/2020 & 11/20/2020 NP Burnetta Sabin       Requested Prescriptions     Pending Prescriptions Disp Refills   . Blood-Glucose Meter (Accu-Chek Guide Me Glucose Mtr) misc       Sig: by Does Not Apply route.   Marland Kitchen glucose blood VI test strips (Accu-Chek Guide test strips) strip     . lancets (Accu-Chek Softclix Lancets) misc 1 Each 11

## 2020-11-06 NOTE — Telephone Encounter (Signed)
[facility] is requesting new prescriptions on behalf of the pt via fax. Please review and prescribe if appropriate.       Last visit 08/21/2020 NP Ivey   Next appointment 11/19/2020 & 11/20/2020 NP Burnetta Sabin     Requested Prescriptions     Pending Prescriptions Disp Refills   . alcohol swabs (BD Single Use Swabs Regular) padm       Sig: by Apply Externally route.

## 2020-11-10 MED ORDER — LANCETS
11 refills | Status: AC
Start: 2020-11-10 — End: ?

## 2020-11-10 MED ORDER — BLOOD-GLUCOSE METER
0 refills | Status: AC
Start: 2020-11-10 — End: ?

## 2020-11-10 MED ORDER — ACCU-CHEK GUIDE TEST STRIPS
ORAL_STRIP | 3 refills | Status: AC
Start: 2020-11-10 — End: ?

## 2020-11-16 NOTE — Telephone Encounter (Signed)
Patient returning call

## 2020-11-17 NOTE — Telephone Encounter (Signed)
Please advise patient due to changes in health status,  she needs to be seen in office before DMV can be compelted

## 2020-11-18 NOTE — Telephone Encounter (Signed)
[place] Order # [id number] Home Health Certification and POC signed on 11/13/20 by provider, faxed to # [phone] on 11/16/20 Order # [id number] confirmation received and scanned into CC.

## 2020-11-19 ENCOUNTER — Ambulatory Visit: Admit: 2020-11-19 | Discharge: 2020-11-19 | Payer: MEDICARE | Attending: Family | Primary: Family

## 2020-11-19 ENCOUNTER — Ambulatory Visit: Attending: Family | Primary: Family

## 2020-11-19 DIAGNOSIS — I10 Essential (primary) hypertension: Secondary | ICD-10-CM

## 2020-11-19 NOTE — Progress Notes (Signed)
Subjective  Lindsey Huynh is a 70 y.o. female presents for hospital follow up   HPI     On February 6 she was admitted to [facility] after cardiac arrest  at home. Was having SOB 3 weeks prior, woke up February 6 with difficulty breathing, called daughter who called EMS  Reports she was on ventilator for several days, no recall of events after she sat on sofa when EMS arrived to  first 3 days in hospital     Reports she was diagnosed with COVID pneumonia and heart failure   Received COVID vaccine, prior to cardiac arrest,  not booster   She continues to have right side chest pain. believes from chest  injury during  CPR. No bruising or difficulty breathing   She has virtual visit with [place] cardiologist on Monday and cardiac cath scheduled for March 10  Was seeing Dr Shelly Rubenstein prior to MI, was to have echo in January, but appointment was rescheduled by office   SBP 154-170  DBP 90-94  FBS 94  She is compliant with daily weight and BP monitoring. Information sent daily, electronically to [facility] provider   Unstable gait and need to use cane since hospital discharge   She has [facility] nurse once week,PT/OT also present   PT wants her use cane more   On several new medications. Denies ADRs    Past Medical History:   Diagnosis Date   ??? Diabetes ([place])     insulin + metformin   ??? GERD (gastroesophageal reflux disease)    ??? Hypercholesterolemia    ??? Hypertension    ??? PAD (peripheral artery disease) (HCC)        Allergies   Allergen Reactions   ??? Neuromuscular Blockers, Steroidal Other (comments)     GI Upset       Current Outpatient Medications   Medication Sig   ??? clopidogreL (PLAVIX) 75 mg tab Take 75 mg by mouth daily.   ??? Jardiance 10 mg tablet Take 10 mg by mouth daily.   ??? pantoprazole (PROTONIX) 20 mg tablet    ??? sacubitril-valsartan (ENTRESTO) 49 mg/51 mg tablet Take 1 Tablet by mouth two (2) times a day.   ??? spironolactone (ALDACTONE) 25 mg tablet Take 25 mg by mouth daily.   ??? Blood-Glucose Meter (Accu-Chek Guide Me Glucose  Mtr) misc Check blood sugar before breakfast and 2 hours after a meal   ??? glucose blood VI test strips (Accu-Chek Guide test strips) strip Check blood sugar twice daily   ??? lancets (Accu-Chek Softclix Lancets) misc Check blood sugar twice daily   ??? metFORMIN ER (GLUCOPHAGE XR) 500 mg tablet TAKE 2 TABLETS BY MOUTH DAILY WITH DINNER   ??? atorvastatin (LIPITOR) 20 mg tablet Take 1 Tablet by mouth daily. (Patient taking differently: Take 80 mg by mouth daily.)   ??? carvediloL (COREG) 3.125 mg tablet Take 1 Tablet by mouth two (2) times daily (with meals). (Patient taking differently: Take 6.25 mg by mouth two (2) times daily (with meals).)   ??? Insulin Needles, Disposable, (Nano Pen Needle) 32 gauge x 5/32" ndle Use with insulin pen 3 times daily   ??? aspirin delayed-release 81 mg tablet Take 81 mg by mouth daily.   ??? amLODIPine (NORVASC) 10 mg tablet Take 1 Tablet by mouth daily. (Patient not taking: Reported on 11/19/2020)     No current facility-administered medications for this visit.       Review of Systems   Constitutional: Positive for malaise/fatigue  and weight loss (non intentional ).   Eyes: Negative.    Respiratory: Negative for cough and shortness of breath.    Cardiovascular: Positive for chest pain. Negative for leg swelling.   Gastrointestinal: Negative.    Genitourinary: Negative.    Musculoskeletal: Positive for myalgias.   Skin: Negative.    Neurological: Positive for weakness. Negative for dizziness and headaches.   Endo/Heme/Allergies: Does not bruise/bleed easily.   Psychiatric/Behavioral: Negative for depression. The patient is nervous/anxious. The patient does not have insomnia.        Objective  Visit Vitals  BP (!) 154/87 (BP 1 Location: Left upper arm, BP Patient Position: Sitting, BP Cuff Size: Adult)   Pulse 63   Temp 98.5 ??F (36.9 ??C) (Oral)   Resp 16   Ht 5\' 5"  (1.651 m)   Wt 158 lb 6.4 oz (71.8 kg)   SpO2 100%   BMI 26.36 kg/m??     Physical Exam  Constitutional:       General: She is not in  acute distress.     Appearance: Normal appearance. She is not ill-appearing.   HENT:      Head: Normocephalic and atraumatic.   Cardiovascular:      Rate and Rhythm: Normal rate and regular rhythm.      Pulses: Normal pulses.      Heart sounds: Normal heart sounds.   Pulmonary:      Effort: Pulmonary effort is normal.      Breath sounds: Normal breath sounds.   Abdominal:      General: Bowel sounds are normal.      Palpations: Abdomen is soft.      Tenderness: There is no abdominal tenderness.   Musculoskeletal:         General: No deformity.      Right lower leg: No edema.      Left lower leg: No edema.      Comments: Antalgic gait, using pronged cane   Skin:     Findings: Bruising present. No erythema or rash.   Neurological:      Mental Status: She is alert and oriented to person, place, and time. Mental status is at baseline.      Cranial Nerves: No cranial nerve deficit.      Gait: Gait abnormal.   Psychiatric:         Mood and Affect: Mood normal.         Behavior: Behavior normal.         Thought Content: Thought content normal.          Assessment & Plan    ICD-10-CM ICD-9-CM    1. Essential hypertension  I10 401.9    2. History of cardiac arrest  Z86.74 V12.53    3. Acute heart failure, unspecified heart failure type (HCC)  I50.9 428.9    4. 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 PAD (peripheral artery disease) (HCC)  I73.9 443.9    7. Hypercholesterolemia  E78.00 272.0    8. Controlled type 2 diabetes mellitus without complication, without long-term current use of insulin (HCC)  E11.9 250.00      Diagnoses and all orders for this visit:    1. Essential hypertension    2. History of cardiac arrest    3. Acute heart failure, unspecified  heart failure type (HCC)    4. 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 PAD (peripheral artery disease) (HCC)    7.  Hypercholesterolemia    8. Controlled type 2 diabetes mellitus without complication, without long-term current use of insulin (HCC)        1. BP elevated today. Defer medication adjustment to cardiologist, appointment pending    Hospital medical records available on EMR    Medications reviewed, discussed indications for new medications       Follow-up and Dispositions    ?? Return in about 3 months (around 02/19/2021) for htn, diabetes, fasting labs.       reviewed medications and side effects in detail  Estrellita Ludwig, NP

## 2020-11-19 NOTE — Progress Notes (Signed)
 Rm 9    Chief Complaint   Patient presents with   . Hypertension   . Other     DMV form         1. Have you been to the ER, urgent care clinic since your last visit?  Hospitalized since your last visit?[facility], 10/26/20, heart failure, covid posititve    2. Have you seen or consulted any other health care providers outside of the [facility] since your last visit?  Include any pap smears or colon screening. No        Health Maintenance Due   Topic Date Due   . Eye Exam Retinal or Dilated  Never done   . DTaP/Tdap/Td series (1 - Tdap) Never done   . Colorectal Cancer Screening Combo  Never done   . Shingrix Vaccine Age 74> (1 of 2) Never done   . Breast Cancer Screen Mammogram  03/03/2016   . MICROALBUMIN Q1  02/24/2019   . COVID-19 Vaccine (3 - Booster for Pfizer series) 09/27/2020           3 most recent PHQ Screens 08/21/2020   Little interest or pleasure in doing things Not at all   Feeling down, depressed, irritable, or hopeless Not at all   Total Score PHQ 2 0           Learning Assessment 02/23/2017   PRIMARY LEARNER Patient   HIGHEST LEVEL OF EDUCATION - PRIMARY LEARNER  2 YEARS OF COLLEGE   PRIMARY LANGUAGE ENGLISH   LEARNER PREFERENCE PRIMARY DEMONSTRATION   ANSWERED BY Jori Bunker   RELATIONSHIP SELF         An After Visit Summary was printed and given to the patient.

## 2020-11-20 ENCOUNTER — Encounter: Payer: MEDICARE | Attending: Family | Primary: Family

## 2020-11-20 NOTE — Telephone Encounter (Signed)
Pt called stating that she had dropped off DMV Form's a couple of week's ago for NP Ivey to fill out to have faxed over to [facility].Writer informed pt that all I had seen was the [facility] Form for the Parking Placard,pt stated that was not the form she was pertaining to.That the form she dropped off was several page's.Pt would like a callback at # [phone].

## 2020-11-20 NOTE — Telephone Encounter (Signed)
Patient is calling in regards to her appt on 3.7.2022 and she wanted to inform us that on 02.07.2022 and was diagnosed with Covid [place] and went into cardiac arrest. Pt was transported to [facility] and was released on 02.11.2022 , while she was there they have done an echo on her and she has been following up with the doctors there.        [phone]

## 2020-11-23 ENCOUNTER — Encounter: Attending: Specialist | Primary: Family

## 2020-11-23 NOTE — Telephone Encounter (Signed)
TC to pt, ID verified. Pt has been being followed by Cardiology at [facility] and has upcoming appointments. Advised she does not need to see both them and [place]. She will have her upcoming procedures and speak with the providers there and decide if she will be coming her or going there for follow ups and let us know.

## 2020-11-27 NOTE — Telephone Encounter (Signed)
[place] ( # [id number]) Physician Order Increase for Skilled Nursing signed on 11/26/20 by provider,faxed on 11/27/20 to # [phone] ( # [id number]) confirmation received and placed in bin to go to Centralized Scanning.

## 2020-11-27 NOTE — Telephone Encounter (Signed)
DNV from completed. Part C and F must be completed by her cardiologist at [facility] due to change in cardiac status  Form given to [facility]

## 2020-11-30 ENCOUNTER — Telehealth: Payer: Self-pay | Admitting: *Deleted

## 2020-11-30 NOTE — Telephone Encounter (Signed)
Received vm message from patient requesting to change her next appt from 12/18/20 to another date.  She is requesting to reschedule to a day between 4/5 and 4/8. She will be out of town 12/18/20  Scheduling message sent

## 2020-11-30 NOTE — Telephone Encounter (Signed)
Pt informed that DMV forms were faxed to Suzi Roots NP as requested by pt. [phone]. [phone]. Confirmation received. Document placed in bin for centralized scanning. Understanding voiced by pt. No further actions required at this time.

## 2020-12-01 ENCOUNTER — Telehealth: Payer: Self-pay | Admitting: Hematology and Oncology

## 2020-12-01 NOTE — Telephone Encounter (Signed)
R/s appts per 3/14 sch msg. Pt aware.

## 2020-12-02 NOTE — ACP (Advance Care Planning) (Signed)
Advance Care Planning   Ambulatory ACP Specialist Patient Outreach    Date:  12/02/2020    ACP Specialist:  Elouise Munroe, LCSW    Outreach call to patient in follow-up to ACP Specialist referral from:    [x]  PCP  []  Provider   []  Ambulatory Care Management []  Other     For:                  [x]  Advance Directive Assistance              []  Complete Portable DNR order              []  Complete POST/MOST              []  Code Status Discussion             []  Discuss Goals of Care             []  Early ACP Decision-Making              []  Other (Specify)    Date Referral Received: 08/24/20    Today's Outreach:  []  First   []  Second  []  Third       Third outreach made by: [x]  Phone  []  Email / mail    []  MyChart     Intervention:  [x]  Spoke with Patient   []  Left VM requesting return call      Outcome:  AMD completed on 10/14/20 and mailed to pt. Specialist called pt today; for, signed document has not be received. Pt indicated she had "Heart Attack" in February. She has document to sign and will plan to take to her [facility] cardiology appt on 12/07/2020 to sign with witnesses. She agreed with new health issues, the document is more important today.    Next Step:   []  ACP scheduled conversation  []  Outreach again in one week               []  Email / Mail ACP Info Sheets  []  Email / Mail Advance Directive   [x]  Closing referral.  Routing closure to referring provider/staff and to ACP Specialist Program Manager with follow up call.    []  Closure letter mailed to patient with invitation to contact ACP Specialist if / when ready.  Thank you for this referral.    Elouise Munroe, LCSW

## 2020-12-08 NOTE — Telephone Encounter (Signed)
Paperwork faxed to [facility] home health [phone]. Confirmation received. Document placed in bin for centralized scanning.

## 2020-12-09 LAB — AMB EXT HGBA1C
Hemoglobin A1C, External: 6.9 %
Hemoglobin A1c, External: 6.9 %

## 2020-12-10 NOTE — Telephone Encounter (Signed)
Per 11/30/20 encounter forms completed and faxed.

## 2020-12-18 ENCOUNTER — Ambulatory Visit: Payer: Medicare Other

## 2020-12-18 ENCOUNTER — Other Ambulatory Visit: Payer: Medicare Other

## 2020-12-18 ENCOUNTER — Ambulatory Visit: Payer: Medicare Other | Admitting: Hematology and Oncology

## 2020-12-23 NOTE — Telephone Encounter (Signed)
-----   Message from Koren Bound sent at 12/18/2020  3:58 PM EDT -----  Subject: Message to Provider    QUESTIONS  Information for Provider? PT had procedure on 3/12 and missed physical   therapy. PT has since been discharged from physical therapy. Elberta Fortis @   [phone]  ---------------------------------------------------------------------------  --------------  Rod Can INFO  What is the best way for the office to contact you? OK to leave message on   voicemail  Preferred Call Back Phone Number? [phone]  ---------------------------------------------------------------------------  --------------  SCRIPT ANSWERS  Relationship to Patient? Third Party  Third Party Type? Home Health Care?   Representative Name? Elberta Fortis @ [place]

## 2020-12-23 NOTE — Telephone Encounter (Signed)
Detailed message left on secured VM for [name]. When call is returned please clarify what is needed from our office.

## 2020-12-25 ENCOUNTER — Encounter: Payer: Self-pay | Admitting: Hematology and Oncology

## 2020-12-25 ENCOUNTER — Other Ambulatory Visit: Payer: Self-pay | Admitting: Hematology and Oncology

## 2020-12-25 ENCOUNTER — Other Ambulatory Visit: Payer: Self-pay

## 2020-12-25 ENCOUNTER — Inpatient Hospital Stay (HOSPITAL_BASED_OUTPATIENT_CLINIC_OR_DEPARTMENT_OTHER): Payer: Medicare Other | Admitting: Hematology and Oncology

## 2020-12-25 ENCOUNTER — Inpatient Hospital Stay: Payer: Medicare Other | Attending: Hematology and Oncology

## 2020-12-25 ENCOUNTER — Inpatient Hospital Stay: Payer: Medicare Other

## 2020-12-25 VITALS — BP 113/67 | HR 73 | Temp 97.9°F | Resp 16

## 2020-12-25 VITALS — BP 119/73 | HR 87 | Temp 98.0°F | Resp 16 | Ht 64.0 in | Wt 162.7 lb

## 2020-12-25 DIAGNOSIS — C8253 Diffuse follicle center lymphoma, intra-abdominal lymph nodes: Secondary | ICD-10-CM

## 2020-12-25 DIAGNOSIS — K654 Sclerosing mesenteritis: Secondary | ICD-10-CM | POA: Diagnosis not present

## 2020-12-25 DIAGNOSIS — Z95828 Presence of other vascular implants and grafts: Secondary | ICD-10-CM

## 2020-12-25 DIAGNOSIS — C8331 Diffuse large B-cell lymphoma, lymph nodes of head, face, and neck: Secondary | ICD-10-CM | POA: Diagnosis not present

## 2020-12-25 DIAGNOSIS — Z7982 Long term (current) use of aspirin: Secondary | ICD-10-CM | POA: Insufficient documentation

## 2020-12-25 DIAGNOSIS — Z79899 Other long term (current) drug therapy: Secondary | ICD-10-CM | POA: Diagnosis not present

## 2020-12-25 DIAGNOSIS — C8256 Diffuse follicle center lymphoma, intrapelvic lymph nodes: Secondary | ICD-10-CM

## 2020-12-25 DIAGNOSIS — Z803 Family history of malignant neoplasm of breast: Secondary | ICD-10-CM | POA: Insufficient documentation

## 2020-12-25 DIAGNOSIS — K219 Gastro-esophageal reflux disease without esophagitis: Secondary | ICD-10-CM | POA: Insufficient documentation

## 2020-12-25 DIAGNOSIS — Z5112 Encounter for antineoplastic immunotherapy: Secondary | ICD-10-CM | POA: Diagnosis not present

## 2020-12-25 DIAGNOSIS — E559 Vitamin D deficiency, unspecified: Secondary | ICD-10-CM | POA: Insufficient documentation

## 2020-12-25 DIAGNOSIS — J45909 Unspecified asthma, uncomplicated: Secondary | ICD-10-CM | POA: Diagnosis not present

## 2020-12-25 DIAGNOSIS — E785 Hyperlipidemia, unspecified: Secondary | ICD-10-CM | POA: Diagnosis not present

## 2020-12-25 LAB — CMP (CANCER CENTER ONLY)
ALT: 22 U/L (ref 0–44)
AST: 25 U/L (ref 15–41)
Albumin: 3.4 g/dL — ABNORMAL LOW (ref 3.5–5.0)
Alkaline Phosphatase: 89 U/L (ref 38–126)
Anion gap: 13 (ref 5–15)
BUN: 11 mg/dL (ref 8–23)
CO2: 23 mmol/L (ref 22–32)
Calcium: 8.4 mg/dL — ABNORMAL LOW (ref 8.9–10.3)
Chloride: 109 mmol/L (ref 98–111)
Creatinine: 0.78 mg/dL (ref 0.44–1.00)
GFR, Estimated: >60 mL/min (ref >60)
Glucose, Bld: 96 mg/dL (ref 70–99)
Potassium: 3.8 mmol/L (ref 3.5–5.1)
Sodium: 145 mmol/L (ref 135–145)
Total Bilirubin: 0.4 mg/dL (ref 0.3–1.2)
Total Protein: 5.8 g/dL — ABNORMAL LOW (ref 6.5–8.1)

## 2020-12-25 LAB — CBC WITH DIFFERENTIAL (CANCER CENTER ONLY)
Abs Immature Granulocytes: 0.02 10*3/uL (ref 0.00–0.07)
Basophils Absolute: 0.1 10*3/uL (ref 0.0–0.1)
Basophils Relative: 2 %
Eosinophils Absolute: 0.2 10*3/uL (ref 0.0–0.5)
Eosinophils Relative: 7 %
HCT: 40 % (ref 36.0–46.0)
Hemoglobin: 13.2 g/dL (ref 12.0–15.0)
Immature Granulocytes: 1 %
Lymphocytes Relative: 21 %
Lymphs Abs: 0.7 10*3/uL (ref 0.7–4.0)
MCH: 30.6 pg (ref 26.0–34.0)
MCHC: 33 g/dL (ref 30.0–36.0)
MCV: 92.6 fL (ref 80.0–100.0)
Monocytes Absolute: 0.6 10*3/uL (ref 0.1–1.0)
Monocytes Relative: 18 %
Neutro Abs: 1.7 10*3/uL (ref 1.7–7.7)
Neutrophils Relative %: 51 %
Platelet Count: 195 10*3/uL (ref 150–400)
RBC: 4.32 MIL/uL (ref 3.87–5.11)
RDW: 12.5 % (ref 11.5–15.5)
WBC Count: 3.3 10*3/uL — ABNORMAL LOW (ref 4.0–10.5)
nRBC: 0 % (ref 0.0–0.2)

## 2020-12-25 LAB — LACTATE DEHYDROGENASE: LDH: 217 U/L — ABNORMAL HIGH (ref 98–192)

## 2020-12-25 MED ORDER — SODIUM CHLORIDE 0.9 % IV SOLN
375.0000 mg/m2 | Freq: Once | INTRAVENOUS | Status: AC
  Administered 2020-12-25: 700 mg via INTRAVENOUS
  Filled 2020-12-25: qty 20

## 2020-12-25 MED ORDER — HEPARIN SOD (PORK) LOCK FLUSH 100 UNIT/ML IV SOLN
500.0000 [IU] | Freq: Once | INTRAVENOUS | Status: AC | PRN
  Administered 2020-12-25: 500 [IU]
  Filled 2020-12-25: qty 5

## 2020-12-25 MED ORDER — SODIUM CHLORIDE 0.9% FLUSH
10.0000 mL | Freq: Once | INTRAVENOUS | Status: AC
  Administered 2020-12-25: 10 mL
  Filled 2020-12-25: qty 10

## 2020-12-25 MED ORDER — SODIUM CHLORIDE 0.9% FLUSH
10.0000 mL | INTRAVENOUS | Status: DC | PRN
  Administered 2020-12-25: 10 mL
  Filled 2020-12-25: qty 10

## 2020-12-25 MED ORDER — ACETAMINOPHEN 325 MG PO TABS
ORAL_TABLET | ORAL | Status: AC
  Filled 2020-12-25: qty 2

## 2020-12-25 MED ORDER — DIPHENHYDRAMINE HCL 25 MG PO CAPS
ORAL_CAPSULE | ORAL | Status: AC
  Filled 2020-12-25: qty 1

## 2020-12-25 MED ORDER — SODIUM CHLORIDE 0.9 % IV SOLN
Freq: Once | INTRAVENOUS | Status: AC
  Filled 2020-12-25: qty 250

## 2020-12-25 MED ORDER — DIPHENHYDRAMINE HCL 25 MG PO CAPS
25.0000 mg | ORAL_CAPSULE | Freq: Once | ORAL | Status: AC
  Administered 2020-12-25: 25 mg via ORAL

## 2020-12-25 MED ORDER — ACETAMINOPHEN 325 MG PO TABS
650.0000 mg | ORAL_TABLET | Freq: Once | ORAL | Status: AC
  Administered 2020-12-25: 650 mg via ORAL

## 2020-12-25 NOTE — Progress Notes (Signed)
[place] [phone]   [phone]  PROGRESS NOTE  Patient Care Team: Prince Solian, MD as PCP - General (Internal Medicine) Mcarthur Rossetti, MD as Consulting Physician (Orthopedic Surgery)  Hematological/Oncological History # Non-Hodgkin B Cell Lymphoma, Follicular Lymphoma. Stage III 1) 02/25/2020: patient underwent excision of a 1.3 cm mobile nodule on the left vertex of the scalp. 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  2) 03/18/2020: Establish care with Dr. Lorenso Courier  3) 03/30/2020: PET CT scan showed  involvement of the left scalp, left axillary lymph node, left hilar and infrahilar nodes, subcarinal node, and mesenteric lymph nodes, as well as a masslike appearance/ high metabolic activity in the lower uterus/cervix.  4) 04/21/2020: biopsy of the mass in the lower uterus/cervix consistent with follicular lymphoma.  5) 06/25/2020: CT C/A/P showed the soft tissue mass in the abdomen increase in size from 5.8 x 3.6 cm to 6.5 x 5.7 cm. The other lymphadenopathy was stable. Patient elected to move forward with treatment.  6) 07/06/2020: Cycle 1 Day 1 of Rituximab/Bendamustine 7) 07/17/2020-07/22/2020: Admitted to [facility] with fevers. Infectious workup negative, but scans show mesenteric panniculitis (fibrosing mesenteritis). Started on prednisone 40mg  PO daily.  8) 08/06/2020-08/27/2020: Weekly Rituximab monotherapy.  9) 09/28/2020: PET CT scan shows no residual disease. Complete response to therapy noted.  10) 10/23/2020: start of rituximab q 8 weeks for consolidation.   Interval History:  Alyssa Elliott 70 y.o. female with medical history significant for diffuse non Hodgkin B cell lymphoma who presents for a follow up visit. The patient's last visit was on 10/23/2020. In the interim since her last visit she completed her first round of q 8 week rituximab. She presents today for  dose 2.   On exam today Alyssa Elliott is accompanied by her husband.  She reports that she has been well overall.  She has had some swelling in both of her ankles which occurred over the last 2 days, though her husband notes they have eaten a good deal of [place] food and high salt content food.  She reports her energy is good and appetite is quite good.  She is measured at 162 pounds today but reports that she normally is around 158 when measured at home.  She denies having any issues with fevers, chills, sweats, nausea, vomiting or diarrhea.  She is had no bumps or lumps or any clear signs of recurrence of the lymphoma..  She otherwise has no, questions or concerns.  A full 10 point ROS is listed below.  MEDICAL HISTORY:  Past Medical History:  Diagnosis Date  . Allergy   . Anxiety   . Asthma   . Cataract 2018   removed  . Complication of anesthesia    drop in BP  . DDD (degenerative disc disease)   . Depression   . GERD (gastroesophageal reflux disease)   . Hemorrhoids   . Hyperlipidemia   . Lymphoma ([place]) dx 2021   no tx for now  . Vitamin D deficiency     SURGICAL HISTORY: Past Surgical History:  Procedure Laterality Date  . ATRIAL ABLATION SURGERY  1997   SVT  . cataract surgery Bilateral 2017  . COLONOSCOPY  2016  . FOOT NEUROMA SURGERY     multiple times bilaterally  . IR IMAGING GUIDED PORT INSERTION  07/13/2020  . PLANTAR FASCIA RELEASE Left 1999 or 2000  . SHOULDER ARTHROSCOPY Left 04/19/2019   Procedure: LEFT SHOULDER MANIPULATION  UNDER ANESTHESIA AND ARTHROSCOPY WITH EXTENSIVE DEBRIDEMENT;  Surgeon: Mcarthur Rossetti, MD;  Location: [facility];  Service: Orthopedics;  Laterality: Left;  . UPPER GI ENDOSCOPY  yrs ago    SOCIAL HISTORY: Social History   Socioeconomic History  . Marital status: Married    Spouse name: Not on file  . Number of children: 0  . Years of education: Not on file  . Highest education level: Not on file  Occupational History  .  Occupation: retired Catering manager  Tobacco Use  . Smoking status: Never Smoker  . Smokeless tobacco: Never Used  Vaping Use  . Vaping Use: Never used  Substance and Sexual Activity  . Alcohol use: No  . Drug use: No  . Sexual activity: Not Currently    Partners: Male    Birth control/protection: Post-menopausal  Other Topics Concern  . Not on file  Social History Narrative  . Not on file   Social Determinants of Health   Financial Resource Strain: Not on file  Food Insecurity: Not on file  Transportation Needs: Not on file  Physical Activity: Not on file  Stress: Not on file  Social Connections: Not on file  Intimate Partner Violence: Not on file    FAMILY HISTORY: Family History  Problem Relation Age of Onset  . Heart disease Mother   . Diabetes Mother   . Tuberculosis Paternal Grandfather   . Bone cancer Paternal Grandfather   . Breast cancer Sister 55  . Melanoma Sister   . Cirrhosis Maternal Grandfather   . Heart failure Paternal Grandmother   . Colon cancer Neg Hx   . Ovarian cancer Neg Hx   . Uterine cancer Neg Hx     ALLERGIES:  is allergic to bupropion, codeine, levofloxacin, penicillins, keflex [cephalexin], latex, and sulfa antibiotics.  MEDICATIONS:  Current Outpatient Medications  Medication Sig Dispense Refill  . acetaminophen (TYLENOL) 500 MG tablet Take 1,000 mg by mouth every 6 (six) hours as needed for moderate pain or headache.    . ARIPiprazole (ABILIFY) 2 MG tablet Take 2 mg by mouth daily.     Marland Kitchen aspirin 81 MG tablet Take 81 mg by mouth at bedtime.     . [employer] FIBER GUMMIES PO Take 2 each by mouth daily.     Marland Kitchen EPINEPHrine 0.3 mg/0.3 mL IJ SOAJ injection Inject 0.3 mg into the muscle as needed for anaphylaxis.     Marland Kitchen GLUCOSAMINE-CHONDROITIN-MSM PO Take 1 tablet by mouth 2 (two) times a day.    . levalbuterol (XOPENEX HFA) 45 MCG/ACT inhaler Inhale 1 puff into the lungs every 4 (four) hours as needed for wheezing or shortness of breath.  (Patient not taking: Reported on 07/17/2020) 1 Inhaler 3  . Melatonin 5 MG TABS Take 20 mg by mouth at bedtime.     . NONFORMULARY OR COMPOUNDED ITEM Estradiol 0.02% Insert 1 gram vaginally 2 times per week. 3 month supply with 3 refills. (Patient not taking: Reported on 07/17/2020) 3 each 3  . pantoprazole (PROTONIX) 40 MG tablet Take 40 mg by mouth 2 (two) times daily.    Marland Kitchen PARoxetine (PAXIL) 40 MG tablet Take 40 mg by mouth daily.     . pravastatin (PRAVACHOL) 40 MG tablet Take 40 mg by mouth at bedtime.     Marland Kitchen SINGULAIR 10 MG tablet Take 10 mg by mouth at bedtime.     . Vitamin D, Ergocalciferol, (DRISDOL) 50000 UNITS CAPS Take 50,000 Units by mouth every Monday.  No current facility-administered medications for this visit.    REVIEW OF SYSTEMS:   Constitutional: ( - ) fevers, ( - )  chills , ( - ) night sweats Eyes: ( - ) blurriness of vision, ( - ) double vision, ( - ) watery eyes Ears, nose, mouth, throat, and face: ( - ) mucositis, ( - ) sore throat Respiratory: ( - ) cough, ( - ) dyspnea, ( - ) wheezes Cardiovascular: ( - ) palpitation, ( - ) chest discomfort, ( - ) lower extremity swelling Gastrointestinal:  ( - ) nausea, ( - ) heartburn, ( - ) change in bowel habits Skin: ( - ) abnormal skin rashes Lymphatics: ( - ) new lymphadenopathy, ( - ) easy bruising Neurological: ( - ) numbness, ( - ) tingling, ( - ) new weaknesses Behavioral/Psych: ( - ) mood change, ( - ) new changes  All other systems were reviewed with the patient and are negative.  PHYSICAL EXAMINATION: GENERAL: well appearing elderly Caucasian female. alert, no distress and comfortable SKIN: scalp lesion reduced to a concave dent in scalp. skin color, texture, turgor are normal, no rashes or significant lesions EYES: conjunctiva are pink and non-injected, sclera clear LYMPH:  no palpable lymphadenopathy in the cervical, axillary or supraclavicular LUNGS: clear to auscultation and percussion with normal  breathing effort HEART: regular rate & rhythm and no murmurs and no lower extremity edema Musculoskeletal: no cyanosis of digits and no clubbing  PSYCH: alert & oriented x 3, fluent speech NEURO: no focal motor/sensory deficits  LABORATORY DATA:  I have reviewed the data as listed CBC Latest Ref Rng & Units 12/25/2020 10/23/2020 08/28/2020  WBC 4.0 - 10.5 K/uL 3.3(L) 7.9 7.0  Hemoglobin 12.0 - 15.0 g/dL 13.2 14.1 12.8  Hematocrit 36.0 - 46.0 % 40.0 42.9 37.8  Platelets 150 - 400 K/uL 195 191 230    CMP Latest Ref Rng & Units 12/25/2020 10/23/2020 08/28/2020  Glucose 70 - 99 mg/dL 96 97 79  BUN 8 - 23 mg/dL 11 9 11   Creatinine 0.44 - 1.00 mg/dL 0.78 0.77 0.74  Sodium 135 - 145 mmol/L 145 142 143  Potassium 3.5 - 5.1 mmol/L 3.8 3.9 3.7  Chloride 98 - 111 mmol/L 109 108 109  CO2 22 - 32 mmol/L 23 26 25   Calcium 8.9 - 10.3 mg/dL 8.4(L) 8.8(L) 8.8(L)  Total Protein 6.5 - 8.1 g/dL 5.8(L) 5.9(L) 5.7(L)  Total Bilirubin 0.3 - 1.2 mg/dL 0.4 0.4 0.5  Alkaline Phos 38 - 126 U/L 89 95 87  AST 15 - 41 U/L 25 20 18   ALT 0 - 44 U/L 22 30 24     No results found for: MPROTEIN No results found for: KPAFRELGTCHN, LAMBDASER, KAPLAMBRATIO   RADIOGRAPHIC STUDIES: I have personally reviewed the radiological images as listed and agreed with the findings in the report: complete response with no residual FDG avid nodes  No results found.  ASSESSMENT & PLAN CHIANTE PEDEN 70 y.o. female with medical history significant for diffuse non Hodgkin B cell lymphoma who presents for a follow up visit.  After review of the imaging, review the labs, discussion with the patient her findings are most consistent with a diffuse non-Hodgkin B-cell lymphoma, follicular lymphoma.  The initial diagnosis based off the biopsy from the lesion on the head was that of a follicle cell lymphoma which tends to be a cutaneous lymphoma, however this diagnosis was considerably less likely given the diffuse spread throughout the lymph  nodes of the  chest and abdomen. Biopsy of the FDG avid lesion in the pelvis resulted with follicular lymphoma.   Previously we discussed the diagnosis of follicular lymphoma and the options moving forward.  She currently does not meet any GELF Criteria and therefore is not required to start therapy.  We discussed that treatment could be started if she so desired and we discussed the risks and benefits of bendamustine rituximab therapy.  She voiced her understanding of the different options between starting treatment and continued observation.  After discussion with her husband she noted she would like to start treatment given the continued growth of the lesion which was abutting the urinary bladder.   The regimen of choice for this patient is Rituximab monotherapy with Rituximab 375mg /m2 IV weekly x 4 weeks, followed by IV rituximab q 8 weeks thereafter. This is being administered with curative intent. We transitioned to this regimen from bendamustine rituximab due to intolerance and hospitalization following her first cycle of treatment.  # Non-Hodgkin B Cell Lymphoma, Follicular Lymphoma. Stage III --pre treatment CT findings showed lymphadenopathy on both sides of the diaphragm. Consistent with at least a Stage III lymphoma -- confirmed the diagnosis with biopsy of the FDG avid mass near the uterus/cervix. Cancel the left axillary lymph node biopsy --started treatment with R-Benda, however the patient ended up hospitalized due to fever, weakness, and marked drop in her labs.  She did not tolerate rituximab and bendamustine combination altogether and therefore we proceeded with rituximab monotherapy alone. She completed Rituximab weekly x 4 weeks.  --She presents today for dose 2 (out of 4) of q 8 week rituximab maintenance today --CT imaging recommend q 6 months x 2 years. First surviellance CT scan due 03/2021 --completed chemotherapy education, port placement. Supportive care (noted below)   --RTC  for next rituximab infusion appointment (02/19/2021)   #Fibrosing Mesenteritis (Mesenteric Panniculitis), improving --improved with prednisone taper, complete resolution of symptoms.  --completed steroid taper.  --continue to monitor   #Supportive Care --Chemotherapy Education to be scheduled  --port in place --zofran 8mg  q8H PRN and compazine 10mg  PO q6H for nausea -- EMLA cream for port -- no pain medication required at this time.   No orders of the defined types were placed in this encounter.  All questions were answered. The patient knows to call the clinic with any problems, questions or concerns.  A total of more than 30 minutes were spent on this encounter and over half of that time was spent on counseling and coordination of care as outlined above.   Ledell Peoples, MD Department of Hematology/Oncology [place] [facility] Phone: [phone] Pager: [phone] Email: [email or web address]  12/25/2020 9:24 AM

## 2020-12-25 NOTE — Patient Instructions (Signed)
[facility] Discharge Instructions for Patients Receiving Chemotherapy  Today you received the following chemotherapy agents: rituximab.  To help prevent nausea and vomiting after your treatment, we encourage you to take your nausea medication as directed.   If you develop nausea and vomiting that is not controlled by your nausea medication, call the clinic.   BELOW ARE SYMPTOMS THAT SHOULD BE REPORTED IMMEDIATELY:  *FEVER GREATER THAN 100.5 F  *CHILLS WITH OR WITHOUT FEVER  NAUSEA AND VOMITING THAT IS NOT CONTROLLED WITH YOUR NAUSEA MEDICATION  *UNUSUAL SHORTNESS OF BREATH  *UNUSUAL BRUISING OR BLEEDING  TENDERNESS IN MOUTH AND THROAT WITH OR WITHOUT PRESENCE OF ULCERS  *URINARY PROBLEMS  *BOWEL PROBLEMS  UNUSUAL RASH Items with * indicate a potential emergency and should be followed up as soon as possible.  Feel free to call the clinic should you have any questions or concerns. The clinic phone number is [phone].  Please show the CHEMO ALERT CARD at check-in to the Emergency Department and triage nurse.   

## 2020-12-25 NOTE — Telephone Encounter (Signed)
Elberta Fortis wants to know if patient has had a second procedure. If patient needs further PT services, please contact [facility] [phone].

## 2020-12-28 ENCOUNTER — Telehealth: Payer: Self-pay | Admitting: Hematology and Oncology

## 2020-12-28 NOTE — Telephone Encounter (Signed)
Scheduled per los. Called and spoke with patient. Confirmed appt 

## 2021-01-20 ENCOUNTER — Ambulatory Visit: Admit: 2021-01-20 | Payer: MEDICARE | Attending: Family | Primary: Family

## 2021-01-20 ENCOUNTER — Ambulatory Visit: Attending: Family | Primary: Family

## 2021-01-20 DIAGNOSIS — I10 Essential (primary) hypertension: Secondary | ICD-10-CM

## 2021-01-20 DIAGNOSIS — I25810 Atherosclerosis of coronary artery bypass graft(s) without angina pectoris: Secondary | ICD-10-CM

## 2021-01-20 MED ORDER — SPIRONOLACTONE 50 MG TAB
50 mg | ORAL_TABLET | Freq: Every day | ORAL | 3 refills | Status: DC
Start: 2021-01-20 — End: 2021-06-01

## 2021-01-20 MED ORDER — OXYCODONE 5 MG TAB
5 mg | ORAL_TABLET | Freq: Four times a day (QID) | ORAL | 0 refills | Status: AC | PRN
Start: 2021-01-20 — End: 2021-01-25

## 2021-01-20 NOTE — Progress Notes (Signed)
Rm 7    Chief Complaint   Patient presents with   . Hospital Follow Up     CABG procedure done 12/25/20 with [facility]         1. Have you been to the ER, urgent care clinic since your last visit?  Hospitalized since your last visit?No    2. Have you seen or consulted any other health care providers outside of the [facility] since your last visit?  Include any pap smears or colon screening. No        Health Maintenance Due   Topic Date Due   . Eye Exam Retinal or Dilated  Never done   . DTaP/Tdap/Td series (1 - Tdap) Never done   . Colorectal Cancer Screening Combo  Never done   . Shingrix Vaccine Age 35> (1 of 2) Never done   . Breast Cancer Screen Mammogram  03/03/2016   . MICROALBUMIN Q1  02/24/2019   . COVID-19 Vaccine (3 - Booster for Pfizer series) 09/27/2020           3 most recent PHQ Screens 01/20/2021   Little interest or pleasure in doing things Not at all   Feeling down, depressed, irritable, or hopeless Not at all   Total Score PHQ 2 0       Fall Risk Assessment, last 12 mths 11/19/2020   Able to walk? Yes   Fall in past 12 months? 0   Do you feel unsteady? 0   Are you worried about falling 0   Number of falls in past 12 months -   Fall with injury? -     No falls    Learning Assessment 02/23/2017   PRIMARY LEARNER Patient   HIGHEST LEVEL OF EDUCATION - PRIMARY LEARNER  2 YEARS OF COLLEGE   PRIMARY LANGUAGE ENGLISH   LEARNER PREFERENCE PRIMARY DEMONSTRATION   ANSWERED BY Sheila Oats   RELATIONSHIP SELF         An After Visit Summary was printed and given to the patient.

## 2021-01-20 NOTE — Progress Notes (Signed)
Subjective  Lindsey Huynh is a 70 y.o. female presents for TOC  HPI     Past Medical History:   Diagnosis Date   ??? Diabetes ([place])     insulin + metformin   ??? GERD (gastroesophageal reflux disease)    ??? Hypercholesterolemia    ??? Hypertension    ??? PAD (peripheral artery disease) (HCC)      Current Outpatient Medications   Medication Sig   ??? clopidogreL (PLAVIX) 75 mg tab Take 75 mg by mouth daily.   ??? magnesium oxide (MAG-OX) 400 mg tablet Take 800 mg by mouth daily.   ??? methocarbamoL (ROBAXIN) 500 mg tablet TAKE 1 TABLET BY MOUTH EVERY 8 HOURS FOR UP TO 10 DAYS AS NEEDED FOR MUSCLE SPASMS   ??? polyethylene glycol (MIRALAX) 17 gram packet MIX AND DRINK 1 PACKET DAILY FOR 10 DAYS   ??? docusate sodium (DSS PO) Take 100 mg by mouth two (2) times a day. For 10 days   ??? spironolactone (ALDACTONE) 50 mg tablet Take 1 Tablet by mouth daily.   ??? Jardiance 10 mg tablet Take 10 mg by mouth daily.   ??? pantoprazole (PROTONIX) 20 mg tablet    ??? Blood-Glucose Meter (Accu-Chek Guide Me Glucose Mtr) misc Check blood sugar before breakfast and 2 hours after a meal   ??? glucose blood VI test strips (Accu-Chek Guide test strips) strip Check blood sugar twice daily   ??? lancets (Accu-Chek Softclix Lancets) misc Check blood sugar twice daily   ??? metFORMIN ER (GLUCOPHAGE XR) 500 mg tablet TAKE 2 TABLETS BY MOUTH DAILY WITH DINNER   ??? atorvastatin (LIPITOR) 20 mg tablet Take 1 Tablet by mouth daily. (Patient taking differently: Take 80 mg by mouth daily.)   ??? carvediloL (COREG) 3.125 mg tablet Take 1 Tablet by mouth two (2) times daily (with meals). (Patient taking differently: Take 6.25 mg by mouth two (2) times daily (with meals).)   ??? Insulin Needles, Disposable, (Nano Pen Needle) 32 gauge x 5/32" ndle Use with insulin pen 3 times daily   ??? aspirin delayed-release 81 mg tablet Take 81 mg by mouth daily.   ??? oxyCODONE IR (ROXICODONE) 5 mg immediate release tablet Take 1 Tablet by mouth every six (6) hours as needed for Pain for up to 5  days. Max Daily Amount: 20 mg.     No current facility-administered medications for this visit.     Allergies   Allergen Reactions   ??? Neuromuscular Blockers, Steroidal Other (comments)     GI Upset       Past Surgical History:   Procedure Laterality Date   ??? 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 Duwaine Maxin   ??? HX COLONOSCOPY     ??? HX CORONARY ARTERY BYPASS GRAFT  12/25/2020    x2, [facility]    ??? HX CORONARY ARTERY BYPASS GRAFT  12/25/2020   ??? HX FEMORAL BYPASS Left 11/28/2019     Dr Timmothy Sours   ??? HX ORTHOPAEDIC  06-30-10    back surgery (tumor removed)   ??? HX TUMOR REMOVAL  06/30/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     She did not take BP medications  until 10 AM today. Believes this is cause of elevated BP. BP readings usually better   She is s/p CABG at [facility] on 4/8, discharged  4/18, follow up with cardiologist  4/25  Not sleeping at nights, sleeps in  2  hour intervals, sleeping in recliner for 1st  2 weeks post op as recommended by cardiac surgeon   Walking exercise every day   No SOB. At nights while in bed she feels pressure on chest   muscle relaxant caused vomiting, none since Sunday   Takes 1/2 tablet  oxycodone with 2 Tylenol for post op pain      [facility] report copied:   Transient ischemic attack 12/09/2020   Cardiac arrest 11/12/2020   Overview: ??      Acute HFrEF (heart failure with reduced ejection fraction) 10/29/2020   Overview: ??     Etiology likely ischemic cardiomyopathy after acute PEA arrest 10/2020. She also has new cardiomyopathy by echocardiogram with LVEF 40-45% and inferior and inferolateral LV hypokinesis suspicious for an ischemic event. Will need repeat TTE when further out to evaluate if her myocardium has recovered.??       Review of Systems   Constitutional: Positive for malaise/fatigue.   Respiratory: Negative for cough and shortness of breath.    Cardiovascular: Positive for chest pain and leg swelling.   Gastrointestinal: Negative.     Genitourinary: Negative.    Musculoskeletal: Positive for myalgias.   Neurological: Negative for dizziness and headaches.       Objective  Visit Vitals  BP (!) 165/88 (BP 1 Location: Left upper arm, BP Patient Position: Sitting, BP Cuff Size: Adult)   Pulse 86   Temp 98.5 ??F (36.9 ??C) (Oral)   Resp 16   Ht 5\' 5"  (1.651 m)   Wt 164 lb 6.4 oz (74.6 kg)   SpO2 100%   BMI 27.36 kg/m??     Physical Exam  Constitutional:       Appearance: Normal appearance.   Eyes:      Conjunctiva/sclera: Conjunctivae normal.   Cardiovascular:      Rate and Rhythm: Normal rate and regular rhythm.      Pulses: Normal pulses.      Heart sounds: Normal heart sounds.   Pulmonary:      Effort: Pulmonary effort is normal.      Breath sounds: Normal breath sounds.   Abdominal:      General: Bowel sounds are normal. There is no distension.      Palpations: Abdomen is soft. There is no mass.      Tenderness: There is no abdominal tenderness. There is no right CVA tenderness or left CVA tenderness.      Hernia: No hernia is present.   Musculoskeletal:      Right lower leg: Edema present.      Left lower leg: Edema present.   Skin:     General: Skin is warm and dry.      Findings: No lesion or rash.   Neurological:      Mental Status: She is alert. Mental status is at baseline.   Psychiatric:         Mood and Affect: Mood normal.         Thought Content: Thought content normal.          Assessment & Plan    ICD-10-CM ICD-9-CM    1. 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 Hx of CABG  Z95.1 V45.81    3. Essential hypertension  I10 401.9 spironolactone (ALDACTONE) 50 mg tablet   4. Bilateral leg edema  R60.0 782.3 spironolactone (ALDACTONE) 50 mg tablet   5. 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 Post-op pain  G89.18 338.18 oxyCODONE IR (ROXICODONE) 5 mg immediate release tablet   7. Ischemic cardiomyopathy  I25.5 414.8    8. Stage 3 chronic kidney  disease, unspecified whether stage 3a or 3b CKD ([place])  N18.30 585.3      Diagnoses and all orders for this visit:    1. 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 Hx of CABG    3. Essential hypertension  -     spironolactone (ALDACTONE) 50 mg tablet; Take 1 Tablet by mouth daily.    4. Bilateral leg edema  -     spironolactone (ALDACTONE) 50 mg tablet; Take 1 Tablet by mouth daily.    5. 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 Post-op pain  -     oxyCODONE IR (ROXICODONE) 5 mg immediate release tablet; Take 1 Tablet by mouth every six (6) hours as needed for Pain for up to 5 days. Max Daily Amount: 20 mg.    7. Ischemic cardiomyopathy    8. Stage 3 chronic kidney disease, unspecified whether stage 3a or 3b CKD ([place])      Follow-up and Dispositions    ?? Return in about 3 months (around 04/22/2021) for diabetes, htn, fasting labs.         BP elevated today. Defer medication adjustment to cardiologist, Spironolactone refilled..  Advised to keep follow up appointment  with cardiologist. Continue BP monitoring   LE edema. Encouraged to wear compression stocking, elevate legs, avoid excessive dietary sodium   Refill pain medication. Advised to discuss post op pain management with cardiologist     lab results and schedule of future lab studies reviewed with patient  reviewed medications and side effects in detail  Karsten Fells, NP

## 2021-01-25 ENCOUNTER — Encounter

## 2021-01-25 NOTE — Telephone Encounter (Signed)
AY:TKZS were sent in error to [facility] mail.order. The pt meds should be going to [facility] on [address]   Last visit

## 2021-01-27 NOTE — Telephone Encounter (Signed)
-----   Message from Benito Mccreedy sent at 01/21/2021  4:42 PM EDT -----  Subject: Medication Problem    QUESTIONS  Name of Medication? spironolactone (ALDACTONE) 50 mg tablet  Patient-reported dosage and instructions? n/a  What question or problem do you have with the medication? Lindsey Huynh would   like for her medications sent to [facility] [place],   [place], and not   [facility] mail order. She would rather be able to pick it up at the pharmacy  Preferred Pharmacy? [id number] - [place], [place] phone number (if available)? [phone]  Additional Information for Provider?   ---------------------------------------------------------------------------  --------------  CALL BACK INFO  What is the best way for the office to contact you? OK to leave message on   voicemail  Preferred Call Back Phone Number? [phone]  ---------------------------------------------------------------------------  --------------  SCRIPT ANSWERS  Relationship to Patient? Self

## 2021-01-27 NOTE — Telephone Encounter (Signed)
-----   Message from Benito Mccreedy sent at 01/21/2021  4:42 PM EDT -----  Subject: Medication Problem    QUESTIONS  Name of Medication? spironolactone (ALDACTONE) 50 mg tablet  Patient-reported dosage and instructions? n/a  What question or problem do you have with the medication? Lindsey Huynh would   like for her medications sent to [facility] [place],   [place], and not   [facility] mail order. She would rather be able to pick it up at the pharmacy  Preferred Pharmacy? [facility] #[id number] - [place], [place] phone number (if available)? [phone]  Additional Information for Provider?   ---------------------------------------------------------------------------  --------------  CALL BACK INFO  What is the best way for the office to contact you? OK to leave message on   voicemail  Preferred Call Back Phone Number? [phone]  ---------------------------------------------------------------------------  --------------  SCRIPT ANSWERS  Relationship to Patient? Self

## 2021-01-27 NOTE — Telephone Encounter (Signed)
Spoke with pt who states that she did received all of her requested medication. As of today she does not want to use [facility] pharmacy and would like it removed from chart. No further actions required at this time.

## 2021-02-02 ENCOUNTER — Encounter

## 2021-02-02 NOTE — Telephone Encounter (Signed)
 Pt left a voice message following up on refill request. Please review.     ------------------------------------------------    Pt left a voice message requesting a refill. Pt stated still in pain.     [phone]      Last visit 01/20/2021 NP Fleeta   Next appointment 04/21/2021 NP Fleeta   Previous refill encounter(s)   01/20/2021 Roxicodone  #10    No access to PDMP     Requested Prescriptions     Pending Prescriptions Disp Refills   . oxyCODONE  IR (ROXICODONE ) 5 mg immediate release tablet 10 Tablet 0     Sig: Take 1 Tablet by mouth every six (6) hours as needed for Pain for up to 5 days. Max Daily Amount: 20 mg.

## 2021-02-05 MED ORDER — OXYCODONE 5 MG TAB
5 mg | ORAL_TABLET | Freq: Four times a day (QID) | ORAL | 0 refills | Status: AC | PRN
Start: 2021-02-05 — End: 2021-02-10

## 2021-02-16 NOTE — Telephone Encounter (Signed)
Home health orders faxed to [facility] home health. [phone]. Confirmation received. Document placed in bin for centralized scanning.

## 2021-02-19 ENCOUNTER — Encounter: Attending: Family | Primary: Family

## 2021-02-26 ENCOUNTER — Inpatient Hospital Stay: Payer: Medicare Other

## 2021-02-26 ENCOUNTER — Inpatient Hospital Stay: Payer: Medicare Other | Attending: Hematology and Oncology | Admitting: Hematology and Oncology

## 2021-02-26 ENCOUNTER — Other Ambulatory Visit: Payer: Self-pay | Admitting: Hematology and Oncology

## 2021-02-26 ENCOUNTER — Other Ambulatory Visit: Payer: Self-pay

## 2021-02-26 VITALS — BP 108/69 | HR 66 | Temp 98.2°F | Resp 16

## 2021-02-26 VITALS — BP 136/80 | HR 79 | Temp 98.0°F | Resp 17 | Wt 159.2 lb

## 2021-02-26 DIAGNOSIS — K219 Gastro-esophageal reflux disease without esophagitis: Secondary | ICD-10-CM | POA: Diagnosis not present

## 2021-02-26 DIAGNOSIS — C8221 Follicular lymphoma grade III, unspecified, lymph nodes of head, face, and neck: Secondary | ICD-10-CM | POA: Diagnosis not present

## 2021-02-26 DIAGNOSIS — E785 Hyperlipidemia, unspecified: Secondary | ICD-10-CM | POA: Insufficient documentation

## 2021-02-26 DIAGNOSIS — C8256 Diffuse follicle center lymphoma, intrapelvic lymph nodes: Secondary | ICD-10-CM

## 2021-02-26 DIAGNOSIS — Z79899 Other long term (current) drug therapy: Secondary | ICD-10-CM | POA: Diagnosis not present

## 2021-02-26 DIAGNOSIS — J45909 Unspecified asthma, uncomplicated: Secondary | ICD-10-CM | POA: Diagnosis not present

## 2021-02-26 DIAGNOSIS — Z803 Family history of malignant neoplasm of breast: Secondary | ICD-10-CM | POA: Insufficient documentation

## 2021-02-26 DIAGNOSIS — C8253 Diffuse follicle center lymphoma, intra-abdominal lymph nodes: Secondary | ICD-10-CM

## 2021-02-26 DIAGNOSIS — E559 Vitamin D deficiency, unspecified: Secondary | ICD-10-CM | POA: Diagnosis not present

## 2021-02-26 DIAGNOSIS — Z7982 Long term (current) use of aspirin: Secondary | ICD-10-CM | POA: Insufficient documentation

## 2021-02-26 DIAGNOSIS — Z5112 Encounter for antineoplastic immunotherapy: Secondary | ICD-10-CM | POA: Diagnosis not present

## 2021-02-26 DIAGNOSIS — K654 Sclerosing mesenteritis: Secondary | ICD-10-CM | POA: Diagnosis not present

## 2021-02-26 DIAGNOSIS — Z95828 Presence of other vascular implants and grafts: Secondary | ICD-10-CM | POA: Diagnosis not present

## 2021-02-26 LAB — CBC WITH DIFFERENTIAL (CANCER CENTER ONLY)
Abs Immature Granulocytes: 0.22 10*3/uL — ABNORMAL HIGH (ref 0.00–0.07)
Basophils Absolute: 0 10*3/uL (ref 0.0–0.1)
Basophils Relative: 1 %
Eosinophils Absolute: 0.2 10*3/uL (ref 0.0–0.5)
Eosinophils Relative: 5 %
HCT: 41.5 % (ref 36.0–46.0)
Hemoglobin: 14.3 g/dL (ref 12.0–15.0)
Immature Granulocytes: 7 %
Lymphocytes Relative: 26 %
Lymphs Abs: 0.8 10*3/uL (ref 0.7–4.0)
MCH: 30.8 pg (ref 26.0–34.0)
MCHC: 34.5 g/dL (ref 30.0–36.0)
MCV: 89.2 fL (ref 80.0–100.0)
Monocytes Absolute: 0.6 10*3/uL (ref 0.1–1.0)
Monocytes Relative: 19 %
Neutro Abs: 1.3 10*3/uL — ABNORMAL LOW (ref 1.7–7.7)
Neutrophils Relative %: 42 %
Platelet Count: 208 10*3/uL (ref 150–400)
RBC: 4.65 MIL/uL (ref 3.87–5.11)
RDW: 12.8 % (ref 11.5–15.5)
WBC Count: 3.2 10*3/uL — ABNORMAL LOW (ref 4.0–10.5)
nRBC: 0 % (ref 0.0–0.2)

## 2021-02-26 LAB — CMP (CANCER CENTER ONLY)
ALT: 22 U/L (ref 0–44)
AST: 24 U/L (ref 15–41)
Albumin: 3.5 g/dL (ref 3.5–5.0)
Alkaline Phosphatase: 106 U/L (ref 38–126)
Anion gap: 10 (ref 5–15)
BUN: 12 mg/dL (ref 8–23)
CO2: 24 mmol/L (ref 22–32)
Calcium: 9.3 mg/dL (ref 8.9–10.3)
Chloride: 107 mmol/L (ref 98–111)
Creatinine: 0.78 mg/dL (ref 0.44–1.00)
GFR, Estimated: >60 mL/min (ref >60)
Glucose, Bld: 130 mg/dL — ABNORMAL HIGH (ref 70–99)
Potassium: 4.1 mmol/L (ref 3.5–5.1)
Sodium: 141 mmol/L (ref 135–145)
Total Bilirubin: 0.3 mg/dL (ref 0.3–1.2)
Total Protein: 6.5 g/dL (ref 6.5–8.1)

## 2021-02-26 LAB — LACTATE DEHYDROGENASE: LDH: 442 U/L — ABNORMAL HIGH (ref 98–192)

## 2021-02-26 MED ORDER — SODIUM CHLORIDE 0.9% FLUSH
10.0000 mL | INTRAVENOUS | Status: DC | PRN
  Administered 2021-02-26: 10 mL
  Filled 2021-02-26: qty 10

## 2021-02-26 MED ORDER — ACETAMINOPHEN 325 MG PO TABS
ORAL_TABLET | ORAL | Status: AC
  Filled 2021-02-26: qty 2

## 2021-02-26 MED ORDER — SODIUM CHLORIDE 0.9 % IV SOLN
Freq: Once | INTRAVENOUS | Status: AC
Start: 2021-02-26 — End: 2021-02-26
  Filled 2021-02-26: qty 250

## 2021-02-26 MED ORDER — DIPHENHYDRAMINE HCL 25 MG PO CAPS
25.0000 mg | ORAL_CAPSULE | Freq: Once | ORAL | Status: AC
  Administered 2021-02-26: 25 mg via ORAL

## 2021-02-26 MED ORDER — HEPARIN SOD (PORK) LOCK FLUSH 100 UNIT/ML IV SOLN
500.0000 [IU] | Freq: Once | INTRAVENOUS | Status: AC | PRN
Start: 2021-02-26 — End: 2021-02-26
  Administered 2021-02-26: 500 [IU]
  Filled 2021-02-26: qty 5

## 2021-02-26 MED ORDER — DIPHENHYDRAMINE HCL 25 MG PO CAPS
ORAL_CAPSULE | ORAL | Status: AC
  Filled 2021-02-26: qty 1

## 2021-02-26 MED ORDER — RITUXIMAB-PVVR CHEMO 500 MG/50ML IV SOLN
375.0000 mg/m2 | Freq: Once | INTRAVENOUS | Status: AC
  Administered 2021-02-26: 700 mg via INTRAVENOUS
  Filled 2021-02-26: qty 20

## 2021-02-26 MED ORDER — ACETAMINOPHEN 325 MG PO TABS
650.0000 mg | ORAL_TABLET | Freq: Once | ORAL | Status: AC
  Administered 2021-02-26: 650 mg via ORAL

## 2021-02-26 NOTE — Patient Instructions (Signed)
Rituximab Injection What is this medication? RITUXIMAB (ri TUX i mab) is a monoclonal antibody. It is used to treat certain types of cancer like non-Hodgkin lymphoma and chronic lymphocytic leukemia. It is also used to treat rheumatoid arthritis, granulomatosis with polyangiitis,microscopic polyangiitis, and pemphigus vulgaris. This medicine may be used for other purposes; ask your health care provider orpharmacist if you have questions. COMMON BRAND NAME(S): RIABNI, Rituxan, RUXIENCE What should I tell my care team before I take this medication? They need to know if you have any of these conditions: chest pain heart disease infection especially a viral infection such as chickenpox, cold sores, hepatitis B, or herpes immune system problems irregular heartbeat or rhythm kidney disease low blood counts (white cells, platelets, or red cells) lung disease recent or upcoming vaccine an unusual or allergic reaction to rituximab, other medicines, foods, dyes, or preservatives pregnant or trying to get pregnant breast-feeding How should I use this medication? This medicine is injected into a vein. It is given by a health care provider ina hospital or clinic setting. A special MedGuide will be given to you before each treatment. Be sure to readthis information carefully each time. Talk to your health care provider about the use of this medicine in children. While this drug may be prescribed for children as young as 6 months forselected conditions, precautions do apply. Overdosage: If you think you have taken too much of this medicine contact apoison control center or emergency room at once. NOTE: This medicine is only for you. Do not share this medicine with others. What if I miss a dose? Keep appointments for follow-up doses. It is important not to miss your dose.Call your health care provider if you are unable to keep an appointment. What may interact with this medication? Do not take this  medicine with any of the following medicines: live vaccines This medicine may also interact with the following medicines: cisplatin This list may not describe all possible interactions. Give your health care provider a list of all the medicines, herbs, non-prescription drugs, or dietary supplements you use. Also tell them if you smoke, drink alcohol, or use illegaldrugs. Some items may interact with your medicine. What should I watch for while using this medication? Your condition will be monitored carefully while you are receiving thismedicine. You may need blood work done while you are taking this medicine. This medicine can cause serious infusion reactions. To reduce the risk your health care provider may give you other medicines to take before receiving thisone. Be sure to follow the directions from your health care provider. This medicine may increase your risk of getting an infection. Call your health care provider for advice if you get a fever, chills, sore throat, or other symptoms of a cold or flu. Do not treat yourself. Try to avoid being aroundpeople who are sick. Call your health care provider if you are around anyone with measles,chickenpox, or if you develop sores or blisters that do not heal properly. Avoid taking medicines that contain aspirin, acetaminophen, ibuprofen, naproxen, or ketoprofen unless instructed by your health care provider. Thesemedicines may hide a fever. This medicine may cause serious skin reactions. They can happen weeks to months after starting the medicine. Contact your health care provider right away if you notice fevers or flu-like symptoms with a rash. The rash may be red or purple and then turn into blisters or peeling of the skin. Or, you might notice a red rash with swelling of the face, lips or lymph nodes   in your neck or underyour arms. In some patients, this medicine may cause a serious brain infection that may cause death. If you have any problems seeing,  thinking, speaking, walking, or standing, tell your healthcare professional right away. If you cannot reachyour healthcare professional, urgently seek other source of medical care. Do not become pregnant while taking this medicine or for at least 12 months after stopping it. Women should inform their health care provider if they wish to become pregnant or think they might be pregnant. There is potential for serious harm to an unborn child. Talk to your health care provider for more information. Women should use a reliable form of birth control while taking this medicine and for 12 months after stopping it. Do not breast-feed whiletaking this medicine or for at least 6 months after stopping it. What side effects may I notice from receiving this medication? Side effects that you should report to your health care provider as soon aspossible: allergic reactions (skin rash, itching or hives; swelling of the face, lips, or tongue) diarrhea edema (sudden weight gain; swelling of the ankles, feet, hands or other unusual swelling; trouble breathing) fast, irregular heartbeat heart attack (trouble breathing; pain or tightness in the chest, neck, back or arms; unusually weak or tired) infection (fever, chills, cough, sore throat, pain or trouble passing urine) kidney injury (trouble passing urine or change in the amount of urine) liver injury (dark yellow or brown urine; general ill feeling or flu-like symptoms; loss of appetite, right upper belly pain; unusually weak or tired, yellowing of the eyes or skin) low blood pressure (dizziness; feeling faint or lightheaded, falls; unusually weak or tired) low red blood cell counts (trouble breathing; feeling faint; lightheaded, falls; unusually weak or tired) mouth sores redness, blistering, peeling, or loosening of the skin, including inside the mouth stomach pain unusual bruising or bleeding wheezing (trouble breathing with loud or whistling  sounds) vomiting Side effects that usually do not require medical attention (report to yourhealth care provider if they continue or are bothersome): headache joint pain muscle cramps, pain nausea This list may not describe all possible side effects. Call your doctor for medical advice about side effects. You may report side effects to FDA at1-800-FDA-1088. Where should I keep my medication? This medicine is given in a hospital or clinic. It will not be stored at home. NOTE: This sheet is a summary. It may not cover all possible information. If you have questions about this medicine, talk to your doctor, pharmacist, orhealth care provider.  2022 Elsevier/Gold Standard (2020-08-27 15:47:26)  

## 2021-02-26 NOTE — Progress Notes (Signed)
[facility] [phone]   [phone]  PROGRESS NOTE  Patient Care Team: Prince Solian, MD as PCP - General (Internal Medicine) Mcarthur Rossetti, MD as Consulting Physician (Orthopedic Surgery)  Hematological/Oncological History # Non-Hodgkin B Cell Lymphoma, Follicular Lymphoma. Stage III 1) 02/25/2020: patient underwent excision of a 1.3 cm mobile nodule on the left vertex of the scalp. 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  2) 03/18/2020: Establish care with Dr. Lorenso Courier  3) 03/30/2020: PET CT scan showed  involvement of the left scalp, left axillary lymph node, left hilar and infrahilar nodes, subcarinal node, and mesenteric lymph nodes, as well as a masslike appearance/ high metabolic activity in the lower uterus/cervix.  4) 04/21/2020: biopsy of the mass in the lower uterus/cervix consistent with follicular lymphoma.  5) 06/25/2020: CT C/A/P showed the soft tissue mass in the abdomen increase in size from 5.8 x 3.6 cm to 6.5 x 5.7 cm. The other lymphadenopathy was stable. Patient elected to move forward with treatment.  6) 07/06/2020: Cycle 1 Day 1 of Rituximab/Bendamustine 7) 07/17/2020-07/22/2020: Admitted to [facility] with fevers. Infectious workup negative, but scans show mesenteric panniculitis (fibrosing mesenteritis). Started on prednisone 40mg  PO daily.  8) 08/06/2020-08/27/2020: Weekly Rituximab monotherapy.  9) 09/28/2020: PET CT scan shows no residual disease. Complete response to therapy noted.  10) 10/23/2020: start of rituximab q 8 weeks for consolidation.  11) 12/25/2020: dose 2 of q 8 week rituximab  12) 02/26/2021: dose 3 of q 8 week rituximab   Interval History:  Alyssa Elliott 70 y.o. female with medical history significant for diffuse non Hodgkin B cell lymphoma who presents for a follow up visit. The patient's last visit was on 12/25/2020. In the interim since  her last visit she completed her first round of q 8 week rituximab. She presents today for dose 3.   On exam today Alyssa Elliott reports that she has been well overall.  She has been doing "great".  In the interim since her last visit.  She has had no major issues with fatigue or nausea.  She tolerated her last 2 infusions well without any difficulty.  She reports that she is had no lymphadenopathy or any other signs or symptoms of recurrent disease.  Her weight has been stable at approximate 159 pounds.  She denies having any issues with fevers, chills, sweats, nausea, vomiting or diarrhea.She otherwise has no, questions or concerns.  A full 10 point ROS is listed below.  MEDICAL HISTORY:  Past Medical History:  Diagnosis Date   Allergy    Anxiety    Asthma    Cataract 2018   removed   Complication of anesthesia    drop in BP   DDD (degenerative disc disease)    Depression    GERD (gastroesophageal reflux disease)    Hemorrhoids    Hyperlipidemia    Lymphoma ([place]) dx 2021   no tx for now   Vitamin D deficiency     SURGICAL HISTORY: Past Surgical History:  Procedure Laterality Date   ATRIAL ABLATION SURGERY  1997   SVT   cataract surgery Bilateral 2017   COLONOSCOPY  2016   FOOT NEUROMA SURGERY     multiple times bilaterally   IR IMAGING GUIDED PORT INSERTION  07/13/2020   PLANTAR FASCIA RELEASE Left 1999 or 2000   SHOULDER ARTHROSCOPY Left 04/19/2019   Procedure: LEFT SHOULDER MANIPULATION UNDER ANESTHESIA AND ARTHROSCOPY WITH EXTENSIVE DEBRIDEMENT;  Surgeon: Ninfa Linden,  Lind Guest, MD;  Location: [facility];  Service: Orthopedics;  Laterality: Left;   UPPER GI ENDOSCOPY  yrs ago    SOCIAL HISTORY: Social History   Socioeconomic History   Marital status: Married    Spouse name: Not on file   Number of children: 0   Years of education: Not on file   Highest education level: Not on file  Occupational History   Occupation: retired Catering manager  Tobacco Use   Smoking  status: Never   Smokeless tobacco: Never  Vaping Use   Vaping Use: Never used  Substance and Sexual Activity   Alcohol use: No   Drug use: No   Sexual activity: Not Currently    Partners: Male    Birth control/protection: Post-menopausal  Other Topics Concern   Not on file  Social History Narrative   Not on file   Social Determinants of Health   Financial Resource Strain: Not on file  Food Insecurity: Not on file  Transportation Needs: Not on file  Physical Activity: Not on file  Stress: Not on file  Social Connections: Not on file  Intimate Partner Violence: Not on file    FAMILY HISTORY: Family History  Problem Relation Age of Onset   Heart disease Mother    Diabetes Mother    Tuberculosis Paternal Grandfather    Bone cancer Paternal Grandfather    Breast cancer Sister 28   Melanoma Sister    Cirrhosis Maternal Grandfather    Heart failure Paternal Grandmother    Colon cancer Neg Hx    Ovarian cancer Neg Hx    Uterine cancer Neg Hx     ALLERGIES:  is allergic to bupropion, codeine, levofloxacin, penicillins, keflex [cephalexin], latex, and sulfa antibiotics.  MEDICATIONS:  Current Outpatient Medications  Medication Sig Dispense Refill   acetaminophen (TYLENOL) 500 MG tablet Take 1,000 mg by mouth every 6 (six) hours as needed for moderate pain or headache.     ARIPiprazole (ABILIFY) 2 MG tablet Take 2 mg by mouth daily.      aspirin 81 MG tablet Take 81 mg by mouth at bedtime.      [employer] FIBER GUMMIES PO Take 2 each by mouth daily.      EPINEPHrine 0.3 mg/0.3 mL IJ SOAJ injection Inject 0.3 mg into the muscle as needed for anaphylaxis.      GLUCOSAMINE-CHONDROITIN-MSM PO Take 1 tablet by mouth 2 (two) times a day.     levalbuterol (XOPENEX HFA) 45 MCG/ACT inhaler Inhale 1 puff into the lungs every 4 (four) hours as needed for wheezing or shortness of breath. (Patient not taking: Reported on 07/17/2020) 1 Inhaler 3   Melatonin 5 MG TABS Take 20 mg by mouth at  bedtime.      NONFORMULARY OR COMPOUNDED ITEM Estradiol 0.02% Insert 1 gram vaginally 2 times per week. 3 month supply with 3 refills. (Patient not taking: Reported on 07/17/2020) 3 each 3   pantoprazole (PROTONIX) 40 MG tablet Take 40 mg by mouth 2 (two) times daily.     PARoxetine (PAXIL) 40 MG tablet Take 40 mg by mouth daily.      pravastatin (PRAVACHOL) 40 MG tablet Take 40 mg by mouth at bedtime.      SINGULAIR 10 MG tablet Take 10 mg by mouth at bedtime.      Vitamin D, Ergocalciferol, (DRISDOL) 50000 UNITS CAPS Take 50,000 Units by mouth every Monday.      No current facility-administered medications for this visit.  REVIEW OF SYSTEMS:   Constitutional: ( - ) fevers, ( - )  chills , ( - ) night sweats Eyes: ( - ) blurriness of vision, ( - ) double vision, ( - ) watery eyes Ears, nose, mouth, throat, and face: ( - ) mucositis, ( - ) sore throat Respiratory: ( - ) cough, ( - ) dyspnea, ( - ) wheezes Cardiovascular: ( - ) palpitation, ( - ) chest discomfort, ( - ) lower extremity swelling Gastrointestinal:  ( - ) nausea, ( - ) heartburn, ( - ) change in bowel habits Skin: ( - ) abnormal skin rashes Lymphatics: ( - ) new lymphadenopathy, ( - ) easy bruising Neurological: ( - ) numbness, ( - ) tingling, ( - ) new weaknesses Behavioral/Psych: ( - ) mood change, ( - ) new changes  All other systems were reviewed with the patient and are negative.  PHYSICAL EXAMINATION: GENERAL: well appearing elderly Caucasian female. alert, no distress and comfortable SKIN: scalp lesion reduced to a concave dent in scalp. skin color, texture, turgor are normal, no rashes or significant lesions EYES: conjunctiva are pink and non-injected, sclera clear LYMPH:  no palpable lymphadenopathy in the cervical, axillary or supraclavicular LUNGS: clear to auscultation and percussion with normal breathing effort HEART: regular rate & rhythm and no murmurs and no lower extremity edema Musculoskeletal: no  cyanosis of digits and no clubbing  PSYCH: alert & oriented x 3, fluent speech NEURO: no focal motor/sensory deficits  LABORATORY DATA:  I have reviewed the data as listed CBC Latest Ref Rng & Units 02/26/2021 12/25/2020 10/23/2020  WBC 4.0 - 10.5 K/uL 3.2(L) 3.3(L) 7.9  Hemoglobin 12.0 - 15.0 g/dL 14.3 13.2 14.1  Hematocrit 36.0 - 46.0 % 41.5 40.0 42.9  Platelets 150 - 400 K/uL 208 195 191    CMP Latest Ref Rng & Units 02/26/2021 12/25/2020 10/23/2020  Glucose 70 - 99 mg/dL 130(H) 96 97  BUN 8 - 23 mg/dL 12 11 9   Creatinine 0.44 - 1.00 mg/dL 0.78 0.78 0.77  Sodium 135 - 145 mmol/L 141 145 142  Potassium 3.5 - 5.1 mmol/L 4.1 3.8 3.9  Chloride 98 - 111 mmol/L 107 109 108  CO2 22 - 32 mmol/L 24 23 26   Calcium 8.9 - 10.3 mg/dL 9.3 8.4(L) 8.8(L)  Total Protein 6.5 - 8.1 g/dL 6.5 5.8(L) 5.9(L)  Total Bilirubin 0.3 - 1.2 mg/dL 0.3 0.4 0.4  Alkaline Phos 38 - 126 U/L 106 89 95  AST 15 - 41 U/L 24 25 20   ALT 0 - 44 U/L 22 22 30     No results found for: MPROTEIN No results found for: KPAFRELGTCHN, LAMBDASER, KAPLAMBRATIO   RADIOGRAPHIC STUDIES: I have personally reviewed the radiological images as listed and agreed with the findings in the report: complete response with no residual FDG avid nodes  No results found.  ASSESSMENT & PLAN Alyssa Elliott 70 y.o. female with medical history significant for diffuse non Hodgkin B cell lymphoma who presents for a follow up visit.  After review of the imaging, review the labs, discussion with the patient her findings are most consistent with a diffuse non-Hodgkin B-cell lymphoma, follicular lymphoma.  The initial diagnosis based off the biopsy from the lesion on the head was that of a follicle cell lymphoma which tends to be a cutaneous lymphoma, however this diagnosis was considerably less likely given the diffuse spread throughout the lymph nodes of the chest and abdomen. Biopsy of the FDG avid lesion in the  pelvis resulted with follicular lymphoma.    Previously we discussed the diagnosis of follicular lymphoma and the options moving forward.  She currently does not meet any GELF Criteria and therefore is not required to start therapy.  We discussed that treatment could be started if she so desired and we discussed the risks and benefits of bendamustine rituximab therapy.  She voiced her understanding of the different options between starting treatment and continued observation.  After discussion with her husband she noted she would like to start treatment given the continued growth of the lesion which was abutting the urinary bladder.   The regimen of choice for this patient is Rituximab monotherapy with Rituximab 375mg /m2 IV weekly x 4 weeks, followed by IV rituximab q 8 weeks thereafter. This is being administered with curative intent. We transitioned to this regimen from bendamustine rituximab due to intolerance and hospitalization following her first cycle of treatment.  # Non-Hodgkin B Cell Lymphoma, Follicular Lymphoma. Stage III --pre treatment CT findings showed lymphadenopathy on both sides of the diaphragm. Consistent with at least a Stage III lymphoma -- confirmed the diagnosis with biopsy of the FDG avid mass near the uterus/cervix. Cancel the left axillary lymph node biopsy --started treatment with R-Benda, however the patient ended up hospitalized due to fever, weakness, and marked drop in her labs.  She did not tolerate rituximab and bendamustine combination altogether and therefore we proceeded with rituximab monotherapy alone. She completed Rituximab weekly x 4 weeks.  --She presents today for dose 3 (out of 4) of q 8 week rituximab maintenance today --CT imaging recommend q 6 months x 2 years. First surviellance CT scan due 03/2021 (holding this due to contrast shortage and stability of her disease).  --completed chemotherapy education, port placement. Supportive care (noted below)   --RTC for next rituximab infusion appointment  in 8 weeks (August 2022)   #Fibrosing Mesenteritis (Mesenteric Panniculitis), improving --improved with prednisone taper, complete resolution of symptoms.  --completed steroid taper.  --continue to monitor   #Supportive Care --Chemotherapy Education to be scheduled  --port in place --zofran 8mg  q8H PRN and compazine 10mg  PO q6H for nausea -- EMLA cream for port -- no pain medication required at this time.   No orders of the defined types were placed in this encounter.  All questions were answered. The patient knows to call the clinic with any problems, questions or concerns.  A total of more than 30 minutes were spent on this encounter and over half of that time was spent on counseling and coordination of care as outlined above.   Ledell Peoples, MD Department of Hematology/Oncology [place] [facility] Phone: [phone] Pager: [phone] Email: [email or web address]  03/02/2021 3:08 PM

## 2021-03-02 ENCOUNTER — Encounter: Payer: Self-pay | Admitting: Hematology and Oncology

## 2021-03-03 ENCOUNTER — Telehealth: Payer: Self-pay | Admitting: Hematology and Oncology

## 2021-03-03 NOTE — Telephone Encounter (Signed)
Scheduled appt per 6/14 sch msg. Pt aware.

## 2021-03-10 NOTE — Progress Notes (Signed)
 Dr. Bonney orders on  6\20\2022, faxed to [facility] on  [phone] to [phone]. CPIM

## 2021-03-19 DIAGNOSIS — Z23 Encounter for immunization: Secondary | ICD-10-CM | POA: Diagnosis not present

## 2021-04-06 NOTE — ACP (Advance Care Planning) (Signed)
Advance Care Planning     Advance Care Planning Clinical Specialist  Conversation Note      Date of ACP Conversation:  10/14/20  Date of ACP Outreach: 04/06/2021      ACP Clinical Specialist: Clinton Gallant, LCSW    Health Care Decision Maker:    Current Designated Health Care Decision Maker:     Primary Decision Maker: Kelton Pillar Child - [phone]    Secondary Decision Maker: [place], [place] - Son - [phone]     Verified with pt of her designated Agents.  She added her son as the successor.    Outcome:  Pt requested updated AMD be mailed to her; for, she wants to review with daughter. Will send 2 copies.  Process reviewed her in getting one signed copy back to be scanned into chart.  She stated she will take one to [facility] once signed.  She receives her cardiac care at [facility]. No further outreach at this time. Pt has ACP specialist contact info if further assistance in needed.    [x]  This note routed to one or more involved healthcare providers    Clinton Gallant, LCSW       04/13/2021: Contacted pt who verified she received the updated AMD in the mail. Will make no further outreaches a this time.  Clinton Gallant, LCSW

## 2021-04-21 ENCOUNTER — Encounter: Payer: MEDICARE | Attending: Family | Primary: Family

## 2021-04-28 ENCOUNTER — Other Ambulatory Visit: Payer: Self-pay

## 2021-04-28 ENCOUNTER — Inpatient Hospital Stay: Payer: Medicare Other

## 2021-04-28 ENCOUNTER — Other Ambulatory Visit: Payer: Self-pay | Admitting: Hematology and Oncology

## 2021-04-28 ENCOUNTER — Inpatient Hospital Stay: Payer: Medicare Other | Attending: Hematology and Oncology

## 2021-04-28 ENCOUNTER — Encounter: Payer: Self-pay | Admitting: Hematology and Oncology

## 2021-04-28 ENCOUNTER — Inpatient Hospital Stay (HOSPITAL_BASED_OUTPATIENT_CLINIC_OR_DEPARTMENT_OTHER): Payer: Medicare Other | Admitting: Hematology and Oncology

## 2021-04-28 VITALS — BP 129/67 | HR 67 | Temp 97.7°F | Resp 18 | Wt 157.0 lb

## 2021-04-28 VITALS — BP 128/53 | HR 83 | Temp 98.8°F | Resp 18

## 2021-04-28 DIAGNOSIS — C8256 Diffuse follicle center lymphoma, intrapelvic lymph nodes: Secondary | ICD-10-CM

## 2021-04-28 DIAGNOSIS — C8253 Diffuse follicle center lymphoma, intra-abdominal lymph nodes: Secondary | ICD-10-CM

## 2021-04-28 DIAGNOSIS — C8510 Unspecified B-cell lymphoma, unspecified site: Secondary | ICD-10-CM | POA: Insufficient documentation

## 2021-04-28 DIAGNOSIS — Z5111 Encounter for antineoplastic chemotherapy: Secondary | ICD-10-CM | POA: Diagnosis not present

## 2021-04-28 DIAGNOSIS — Z95828 Presence of other vascular implants and grafts: Secondary | ICD-10-CM | POA: Diagnosis not present

## 2021-04-28 LAB — CBC WITH DIFFERENTIAL (CANCER CENTER ONLY)
Abs Immature Granulocytes: 0.01 10*3/uL (ref 0.00–0.07)
Basophils Absolute: 0.1 10*3/uL (ref 0.0–0.1)
Basophils Relative: 1 %
Eosinophils Absolute: 0.1 10*3/uL (ref 0.0–0.5)
Eosinophils Relative: 2 %
HCT: 44.5 % (ref 36.0–46.0)
Hemoglobin: 15 g/dL (ref 12.0–15.0)
Immature Granulocytes: 0 %
Lymphocytes Relative: 22 %
Lymphs Abs: 0.9 10*3/uL (ref 0.7–4.0)
MCH: 30.7 pg (ref 26.0–34.0)
MCHC: 33.7 g/dL (ref 30.0–36.0)
MCV: 91 fL (ref 80.0–100.0)
Monocytes Absolute: 0.6 10*3/uL (ref 0.1–1.0)
Monocytes Relative: 13 %
Neutro Abs: 2.6 10*3/uL (ref 1.7–7.7)
Neutrophils Relative %: 62 %
Platelet Count: 210 10*3/uL (ref 150–400)
RBC: 4.89 MIL/uL (ref 3.87–5.11)
RDW: 13.2 % (ref 11.5–15.5)
WBC Count: 4.2 10*3/uL (ref 4.0–10.5)
nRBC: 0 % (ref 0.0–0.2)

## 2021-04-28 LAB — CMP (CANCER CENTER ONLY)
ALT: 18 U/L (ref 0–44)
AST: 20 U/L (ref 15–41)
Albumin: 3.8 g/dL (ref 3.5–5.0)
Alkaline Phosphatase: 103 U/L (ref 38–126)
Anion gap: 9 (ref 5–15)
BUN: 11 mg/dL (ref 8–23)
CO2: 27 mmol/L (ref 22–32)
Calcium: 9.8 mg/dL (ref 8.9–10.3)
Chloride: 108 mmol/L (ref 98–111)
Creatinine: 0.86 mg/dL (ref 0.44–1.00)
GFR, Estimated: >60 mL/min (ref >60)
Glucose, Bld: 97 mg/dL (ref 70–99)
Potassium: 4.6 mmol/L (ref 3.5–5.1)
Sodium: 144 mmol/L (ref 135–145)
Total Bilirubin: 0.6 mg/dL (ref 0.3–1.2)
Total Protein: 6.6 g/dL (ref 6.5–8.1)

## 2021-04-28 LAB — LACTATE DEHYDROGENASE: LDH: 187 U/L (ref 98–192)

## 2021-04-28 MED ORDER — SODIUM CHLORIDE 0.9 % IV SOLN
Freq: Once | INTRAVENOUS | Status: AC
  Filled 2021-04-28: qty 250

## 2021-04-28 MED ORDER — ACETAMINOPHEN 325 MG PO TABS
650.0000 mg | ORAL_TABLET | Freq: Once | ORAL | Status: AC
  Administered 2021-04-28: 650 mg via ORAL
  Filled 2021-04-28: qty 2

## 2021-04-28 MED ORDER — DIPHENHYDRAMINE HCL 25 MG PO CAPS
25.0000 mg | ORAL_CAPSULE | Freq: Once | ORAL | Status: AC
  Administered 2021-04-28: 25 mg via ORAL
  Filled 2021-04-28: qty 1

## 2021-04-28 MED ORDER — ALTEPLASE 2 MG IJ SOLR
2.0000 mg | Freq: Once | INTRAMUSCULAR | Status: AC
  Administered 2021-04-28: 2 mg
  Filled 2021-04-28: qty 2

## 2021-04-28 MED ORDER — ALTEPLASE 2 MG IJ SOLR
INTRAMUSCULAR | Status: AC
  Filled 2021-04-28: qty 2

## 2021-04-28 MED ORDER — SODIUM CHLORIDE 0.9% FLUSH
10.0000 mL | Freq: Once | INTRAVENOUS | Status: AC
  Administered 2021-04-28: 10 mL
  Filled 2021-04-28: qty 10

## 2021-04-28 MED ORDER — HEPARIN SOD (PORK) LOCK FLUSH 100 UNIT/ML IV SOLN
500.0000 [IU] | Freq: Once | INTRAVENOUS | Status: AC | PRN
  Administered 2021-04-28: 500 [IU]
  Filled 2021-04-28: qty 5

## 2021-04-28 MED ORDER — RITUXIMAB-PVVR CHEMO 500 MG/50ML IV SOLN
375.0000 mg/m2 | Freq: Once | INTRAVENOUS | Status: AC
  Administered 2021-04-28: 700 mg via INTRAVENOUS
  Filled 2021-04-28: qty 50

## 2021-04-28 MED ORDER — SODIUM CHLORIDE 0.9% FLUSH
10.0000 mL | INTRAVENOUS | Status: DC | PRN
  Administered 2021-04-28: 10 mL
  Filled 2021-04-28: qty 10

## 2021-04-28 NOTE — Patient Instructions (Signed)
Rituximab Injection What is this medication? RITUXIMAB (ri TUX i mab) is a monoclonal antibody. It is used to treat certain types of cancer like non-Hodgkin lymphoma and chronic lymphocytic leukemia. It is also used to treat rheumatoid arthritis, granulomatosis with polyangiitis,microscopic polyangiitis, and pemphigus vulgaris. This medicine may be used for other purposes; ask your health care provider orpharmacist if you have questions. COMMON BRAND NAME(S): RIABNI, Rituxan, RUXIENCE What should I tell my care team before I take this medication? They need to know if you have any of these conditions: chest pain heart disease infection especially a viral infection such as chickenpox, cold sores, hepatitis B, or herpes immune system problems irregular heartbeat or rhythm kidney disease low blood counts (white cells, platelets, or red cells) lung disease recent or upcoming vaccine an unusual or allergic reaction to rituximab, other medicines, foods, dyes, or preservatives pregnant or trying to get pregnant breast-feeding How should I use this medication? This medicine is injected into a vein. It is given by a health care provider ina hospital or clinic setting. A special MedGuide will be given to you before each treatment. Be sure to readthis information carefully each time. Talk to your health care provider about the use of this medicine in children. While this drug may be prescribed for children as young as 6 months forselected conditions, precautions do apply. Overdosage: If you think you have taken too much of this medicine contact apoison control center or emergency room at once. NOTE: This medicine is only for you. Do not share this medicine with others. What if I miss a dose? Keep appointments for follow-up doses. It is important not to miss your dose.Call your health care provider if you are unable to keep an appointment. What may interact with this medication? Do not take this  medicine with any of the following medicines: live vaccines This medicine may also interact with the following medicines: cisplatin This list may not describe all possible interactions. Give your health care provider a list of all the medicines, herbs, non-prescription drugs, or dietary supplements you use. Also tell them if you smoke, drink alcohol, or use illegaldrugs. Some items may interact with your medicine. What should I watch for while using this medication? Your condition will be monitored carefully while you are receiving thismedicine. You may need blood work done while you are taking this medicine. This medicine can cause serious infusion reactions. To reduce the risk your health care provider may give you other medicines to take before receiving thisone. Be sure to follow the directions from your health care provider. This medicine may increase your risk of getting an infection. Call your health care provider for advice if you get a fever, chills, sore throat, or other symptoms of a cold or flu. Do not treat yourself. Try to avoid being aroundpeople who are sick. Call your health care provider if you are around anyone with measles,chickenpox, or if you develop sores or blisters that do not heal properly. Avoid taking medicines that contain aspirin, acetaminophen, ibuprofen, naproxen, or ketoprofen unless instructed by your health care provider. Thesemedicines may hide a fever. This medicine may cause serious skin reactions. They can happen weeks to months after starting the medicine. Contact your health care provider right away if you notice fevers or flu-like symptoms with a rash. The rash may be red or purple and then turn into blisters or peeling of the skin. Or, you might notice a red rash with swelling of the face, lips or lymph nodes   in your neck or underyour arms. In some patients, this medicine may cause a serious brain infection that may cause death. If you have any problems seeing,  thinking, speaking, walking, or standing, tell your healthcare professional right away. If you cannot reachyour healthcare professional, urgently seek other source of medical care. Do not become pregnant while taking this medicine or for at least 12 months after stopping it. Women should inform their health care provider if they wish to become pregnant or think they might be pregnant. There is potential for serious harm to an unborn child. Talk to your health care provider for more information. Women should use a reliable form of birth control while taking this medicine and for 12 months after stopping it. Do not breast-feed whiletaking this medicine or for at least 6 months after stopping it. What side effects may I notice from receiving this medication? Side effects that you should report to your health care provider as soon aspossible: allergic reactions (skin rash, itching or hives; swelling of the face, lips, or tongue) diarrhea edema (sudden weight gain; swelling of the ankles, feet, hands or other unusual swelling; trouble breathing) fast, irregular heartbeat heart attack (trouble breathing; pain or tightness in the chest, neck, back or arms; unusually weak or tired) infection (fever, chills, cough, sore throat, pain or trouble passing urine) kidney injury (trouble passing urine or change in the amount of urine) liver injury (dark yellow or brown urine; general ill feeling or flu-like symptoms; loss of appetite, right upper belly pain; unusually weak or tired, yellowing of the eyes or skin) low blood pressure (dizziness; feeling faint or lightheaded, falls; unusually weak or tired) low red blood cell counts (trouble breathing; feeling faint; lightheaded, falls; unusually weak or tired) mouth sores redness, blistering, peeling, or loosening of the skin, including inside the mouth stomach pain unusual bruising or bleeding wheezing (trouble breathing with loud or whistling  sounds) vomiting Side effects that usually do not require medical attention (report to yourhealth care provider if they continue or are bothersome): headache joint pain muscle cramps, pain nausea This list may not describe all possible side effects. Call your doctor for medical advice about side effects. You may report side effects to FDA at1-800-FDA-1088. Where should I keep my medication? This medicine is given in a hospital or clinic. It will not be stored at home. NOTE: This sheet is a summary. It may not cover all possible information. If you have questions about this medicine, talk to your doctor, pharmacist, orhealth care provider.  2022 Elsevier/Gold Standard (2020-08-27 15:47:26)  

## 2021-04-28 NOTE — Progress Notes (Signed)
[place] [phone]   [phone]  PROGRESS NOTE  Patient Care Team: Prince Solian, MD as PCP - General (Internal Medicine) Mcarthur Rossetti, MD as Consulting Physician (Orthopedic Surgery)  Hematological/Oncological History # Non-Hodgkin B Cell Lymphoma, Follicular Lymphoma. Stage III 1) 02/25/2020: patient underwent excision of a 1.3 cm mobile nodule on the left vertex of the scalp. 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  2) 03/18/2020: Establish care with Dr. Lorenso Courier  3) 03/30/2020: PET CT scan showed  involvement of the left scalp, left axillary lymph node, left hilar and infrahilar nodes, subcarinal node, and mesenteric lymph nodes, as well as a masslike appearance/ high metabolic activity in the lower uterus/cervix.  4) 04/21/2020: biopsy of the mass in the lower uterus/cervix consistent with follicular lymphoma.  5) 06/25/2020: CT C/A/P showed the soft tissue mass in the abdomen increase in size from 5.8 x 3.6 cm to 6.5 x 5.7 cm. The other lymphadenopathy was stable. Patient elected to move forward with treatment.  6) 07/06/2020: Cycle 1 Day 1 of Rituximab/Bendamustine 7) 07/17/2020-07/22/2020: Admitted to [facility] with fevers. Infectious workup negative, but scans show mesenteric panniculitis (fibrosing mesenteritis). Started on prednisone '40mg'$  PO daily.  8) 08/06/2020-08/27/2020: Weekly Rituximab monotherapy.  9) 09/28/2020: PET CT scan shows no residual disease. Complete response to therapy noted.  10) 10/23/2020: start of rituximab q 8 weeks for consolidation.  11) 12/25/2020: dose 2 of q 8 week rituximab  12) 02/26/2021: dose 3 of q 8 week rituximab  13) 04/28/2021: dose 4 of q 8 week rituximab   Interval History:  Alyssa Elliott 70 y.o. female with medical history significant for diffuse non Hodgkin B cell lymphoma who presents for a follow up visit. The patient's last  visit was on 02/26/2021. In the interim since her last visit she completed 3 rounds of q 8 week rituximab. She presents today for dose 4.   On exam today [name] reports that she feels well.  She notes her energy is good and her weight has been steady.  She is doing her best to lose weight and is actually been losing some weight because her husband is on a diet as well.  She notes that she is not noticed any new bumps or lumps or any signs or symptoms concerning for recurrence of her lymphoma.  She denies having any issues with fevers, chills, sweats, nausea, vomiting or diarrhea.She otherwise has no, questions or concerns.  A full 10 point ROS is listed below.  MEDICAL HISTORY:  Past Medical History:  Diagnosis Date   Allergy    Anxiety    Asthma    Cataract 2018   removed   Complication of anesthesia    drop in BP   DDD (degenerative disc disease)    Depression    GERD (gastroesophageal reflux disease)    Hemorrhoids    Hyperlipidemia    Lymphoma ([place]) dx 2021   no tx for now   Vitamin D deficiency     SURGICAL HISTORY: Past Surgical History:  Procedure Laterality Date   ATRIAL ABLATION SURGERY  1997   SVT   cataract surgery Bilateral 2017   COLONOSCOPY  2016   FOOT NEUROMA SURGERY     multiple times bilaterally   IR IMAGING GUIDED PORT INSERTION  07/13/2020   PLANTAR FASCIA RELEASE Left 1999 or 2000   SHOULDER ARTHROSCOPY Left 04/19/2019   Procedure: LEFT SHOULDER MANIPULATION UNDER ANESTHESIA AND ARTHROSCOPY WITH  EXTENSIVE DEBRIDEMENT;  Surgeon: Mcarthur Rossetti, MD;  Location: [facility];  Service: Orthopedics;  Laterality: Left;   UPPER GI ENDOSCOPY  yrs ago    SOCIAL HISTORY: Social History   Socioeconomic History   Marital status: Married    Spouse name: Not on file   Number of children: 0   Years of education: Not on file   Highest education level: Not on file  Occupational History   Occupation: retired Catering manager  Tobacco Use   Smoking  status: Never   Smokeless tobacco: Never  Vaping Use   Vaping Use: Never used  Substance and Sexual Activity   Alcohol use: No   Drug use: No   Sexual activity: Not Currently    Partners: Male    Birth control/protection: Post-menopausal  Other Topics Concern   Not on file  Social History Narrative   Not on file   Social Determinants of Health   Financial Resource Strain: Not on file  Food Insecurity: Not on file  Transportation Needs: Not on file  Physical Activity: Not on file  Stress: Not on file  Social Connections: Not on file  Intimate Partner Violence: Not on file    FAMILY HISTORY: Family History  Problem Relation Age of Onset   Heart disease Mother    Diabetes Mother    Tuberculosis Paternal Grandfather    Bone cancer Paternal Grandfather    Breast cancer Sister 30   Melanoma Sister    Cirrhosis Maternal Grandfather    Heart failure Paternal Grandmother    Colon cancer Neg Hx    Ovarian cancer Neg Hx    Uterine cancer Neg Hx     ALLERGIES:  is allergic to bupropion, codeine, levofloxacin, penicillins, keflex [cephalexin], latex, and sulfa antibiotics.  MEDICATIONS:  Current Outpatient Medications  Medication Sig Dispense Refill   acetaminophen (TYLENOL) 500 MG tablet Take 1,000 mg by mouth every 6 (six) hours as needed for moderate pain or headache.     ARIPiprazole (ABILIFY) 2 MG tablet Take 2 mg by mouth daily.      aspirin 81 MG tablet Take 81 mg by mouth at bedtime.      [employer] FIBER GUMMIES PO Take 2 each by mouth daily.      EPINEPHrine 0.3 mg/0.3 mL IJ SOAJ injection Inject 0.3 mg into the muscle as needed for anaphylaxis.      GLUCOSAMINE-CHONDROITIN-MSM PO Take 1 tablet by mouth 2 (two) times a day.     levalbuterol (XOPENEX HFA) 45 MCG/ACT inhaler Inhale 1 puff into the lungs every 4 (four) hours as needed for wheezing or shortness of breath. (Patient not taking: Reported on 07/17/2020) 1 Inhaler 3   Melatonin 5 MG TABS Take 20 mg by mouth at  bedtime.      NONFORMULARY OR COMPOUNDED ITEM Estradiol 0.02% Insert 1 gram vaginally 2 times per week. 3 month supply with 3 refills. (Patient not taking: Reported on 07/17/2020) 3 each 3   pantoprazole (PROTONIX) 40 MG tablet Take 40 mg by mouth 2 (two) times daily.     PARoxetine (PAXIL) 40 MG tablet Take 40 mg by mouth daily.      pravastatin (PRAVACHOL) 40 MG tablet Take 40 mg by mouth at bedtime.      SINGULAIR 10 MG tablet Take 10 mg by mouth at bedtime.      Vitamin D, Ergocalciferol, (DRISDOL) 50000 UNITS CAPS Take 50,000 Units by mouth every Monday.      No current facility-administered medications  for this visit.   Facility-Administered Medications Ordered in Other Visits  Medication Dose Route Frequency Provider Last Rate Last Admin   sodium chloride flush (NS) 0.9 % injection 10 mL  10 mL Intracatheter PRN Orson Slick, MD   10 mL at 04/28/21 1603    REVIEW OF SYSTEMS:   Constitutional: ( - ) fevers, ( - )  chills , ( - ) night sweats Eyes: ( - ) blurriness of vision, ( - ) double vision, ( - ) watery eyes Ears, nose, mouth, throat, and face: ( - ) mucositis, ( - ) sore throat Respiratory: ( - ) cough, ( - ) dyspnea, ( - ) wheezes Cardiovascular: ( - ) palpitation, ( - ) chest discomfort, ( - ) lower extremity swelling Gastrointestinal:  ( - ) nausea, ( - ) heartburn, ( - ) change in bowel habits Skin: ( - ) abnormal skin rashes Lymphatics: ( - ) new lymphadenopathy, ( - ) easy bruising Neurological: ( - ) numbness, ( - ) tingling, ( - ) new weaknesses Behavioral/Psych: ( - ) mood change, ( - ) new changes  All other systems were reviewed with the patient and are negative.  PHYSICAL EXAMINATION: GENERAL: well appearing elderly Caucasian female. alert, no distress and comfortable SKIN: scalp lesion reduced to a concave dent in scalp. skin color, texture, turgor are normal, no rashes or significant lesions EYES: conjunctiva are pink and non-injected, sclera  clear LYMPH:  no palpable lymphadenopathy in the cervical, axillary or supraclavicular LUNGS: clear to auscultation and percussion with normal breathing effort HEART: regular rate & rhythm and no murmurs and no lower extremity edema Musculoskeletal: no cyanosis of digits and no clubbing  PSYCH: alert & oriented x 3, fluent speech NEURO: no focal motor/sensory deficits  LABORATORY DATA:  I have reviewed the data as listed CBC Latest Ref Rng & Units 04/28/2021 02/26/2021 12/25/2020  WBC 4.0 - 10.5 K/uL 4.2 3.2(L) 3.3(L)  Hemoglobin 12.0 - 15.0 g/dL 15.0 14.3 13.2  Hematocrit 36.0 - 46.0 % 44.5 41.5 40.0  Platelets 150 - 400 K/uL 210 208 195    CMP Latest Ref Rng & Units 04/28/2021 02/26/2021 12/25/2020  Glucose 70 - 99 mg/dL 97 130(H) 96  BUN 8 - 23 mg/dL '11 12 11  '$ Creatinine 0.44 - 1.00 mg/dL 0.86 0.78 0.78  Sodium 135 - 145 mmol/L 144 141 145  Potassium 3.5 - 5.1 mmol/L 4.6 4.1 3.8  Chloride 98 - 111 mmol/L 108 107 109  CO2 22 - 32 mmol/L '27 24 23  '$ Calcium 8.9 - 10.3 mg/dL 9.8 9.3 8.4(L)  Total Protein 6.5 - 8.1 g/dL 6.6 6.5 5.8(L)  Total Bilirubin 0.3 - 1.2 mg/dL 0.6 0.3 0.4  Alkaline Phos 38 - 126 U/L 103 106 89  AST 15 - 41 U/L '20 24 25  '$ ALT 0 - 44 U/L '18 22 22    '$ No results found for: MPROTEIN No results found for: KPAFRELGTCHN, LAMBDASER, KAPLAMBRATIO   RADIOGRAPHIC STUDIES:  No results found.  ASSESSMENT & PLAN JAIYLAH KNIBBS 70 y.o. female with medical history significant for diffuse non Hodgkin B cell lymphoma who presents for a follow up visit.  After review of the imaging, review the labs, discussion with the patient her findings are most consistent with a diffuse non-Hodgkin B-cell lymphoma, follicular lymphoma.  The initial diagnosis based off the biopsy from the lesion on the head was that of a follicle cell lymphoma which tends to be a cutaneous lymphoma, however  this diagnosis was considerably less likely given the diffuse spread throughout the lymph nodes of the  chest and abdomen. Biopsy of the FDG avid lesion in the pelvis resulted with follicular lymphoma.   Previously we discussed the diagnosis of follicular lymphoma and the options moving forward.  She currently does not meet any GELF Criteria and therefore is not required to start therapy.  We discussed that treatment could be started if she so desired and we discussed the risks and benefits of bendamustine rituximab therapy.  She voiced her understanding of the different options between starting treatment and continued observation.  After discussion with her husband she noted she would like to start treatment given the continued growth of the lesion which was abutting the urinary bladder.   The regimen of choice for this patient is Rituximab monotherapy with Rituximab '375mg'$ /m2 IV weekly x 4 weeks, followed by IV rituximab q 8 weeks thereafter. This is being administered with curative intent. We transitioned to this regimen from bendamustine rituximab due to intolerance and hospitalization following her first cycle of treatment.  # Non-Hodgkin B Cell Lymphoma, Follicular Lymphoma. Stage III --pre treatment CT findings showed lymphadenopathy on both sides of the diaphragm. Consistent with at least a Stage III lymphoma -- confirmed the diagnosis with biopsy of the FDG avid mass near the uterus/cervix. Cancel the left axillary lymph node biopsy --started treatment with R-Benda, however the patient ended up hospitalized due to fever, weakness, and marked drop in her labs.  She did not tolerate rituximab and bendamustine combination altogether and therefore we proceeded with rituximab monotherapy alone. She completed Rituximab weekly x 4 weeks.  Plan:  --CT imaging recommend q 6 months x 2 years. First surviellance CT scan due. Will order this today --She presents today for dose 4 (out of 4) of q 8 week rituximab maintenance today. Will enter surveillance after this --RTC in 3 months time   #Fibrosing  Mesenteritis (Mesenteric Panniculitis), resolved --improved with prednisone taper, complete resolution of symptoms.  --completed steroid taper.  --continue to monitor   #Supportive Care --port in place. Can be removed if NED on next CT scan --zofran '8mg'$  q8H PRN and compazine '10mg'$  PO q6H for nausea -- EMLA cream for port -- no pain medication required at this time.   Orders Placed This Encounter  Procedures   CT CHEST ABDOMEN PELVIS W CONTRAST    Standing Status:   Future    Standing Expiration Date:   04/28/2022    Order Specific Question:   If indicated for the ordered procedure, I authorize the administration of contrast media per Radiology protocol    Answer:   Yes    Order Specific Question:   Preferred imaging location?    Answer:   [facility]    Order Specific Question:   Release to patient    Answer:   Immediate    Order Specific Question:   Is Oral Contrast requested for this exam?    Answer:   Yes, Per Radiology protocol    Order Specific Question:   Reason for Exam (SYMPTOM  OR DIAGNOSIS REQUIRED)    Answer:   hx of follicular lymphoma, assess for recurrence    All questions were answered. The patient knows to call the clinic with any problems, questions or concerns.  A total of more than 30 minutes were spent on this encounter and over half of that time was spent on counseling and coordination of care as outlined above.   Jenny Reichmann  Lauretta Grill, MD Department of Hematology/Oncology [place] [facility] Phone: [phone] Pager: [phone] Email: [email or web address]  04/28/2021 5:33 PM

## 2021-05-03 ENCOUNTER — Telehealth: Payer: Self-pay | Admitting: Hematology and Oncology

## 2021-05-03 NOTE — Telephone Encounter (Signed)
Scheduled per los. Called and spoke with patient. Confirmed appt 

## 2021-05-10 ENCOUNTER — Other Ambulatory Visit: Payer: Self-pay

## 2021-05-10 ENCOUNTER — Encounter (HOSPITAL_COMMUNITY): Payer: Self-pay

## 2021-05-10 ENCOUNTER — Ambulatory Visit (HOSPITAL_COMMUNITY)
Admission: RE | Admit: 2021-05-10 | Discharge: 2021-05-10 | Disposition: A | Discharge status: Home / Self Care | Payer: Medicare Other | Source: Ambulatory Visit | Attending: Hematology and Oncology | Admitting: Hematology and Oncology

## 2021-05-10 DIAGNOSIS — C8290 Follicular lymphoma, unspecified, unspecified site: Secondary | ICD-10-CM | POA: Diagnosis not present

## 2021-05-10 DIAGNOSIS — C8256 Diffuse follicle center lymphoma, intrapelvic lymph nodes: Secondary | ICD-10-CM | POA: Diagnosis not present

## 2021-05-10 MED ORDER — HEPARIN SOD (PORK) LOCK FLUSH 100 UNIT/ML IV SOLN
500.0000 [IU] | Freq: Once | INTRAVENOUS | Status: AC
  Administered 2021-05-10: 500 [IU] via INTRAVENOUS

## 2021-05-10 MED ORDER — HEPARIN SOD (PORK) LOCK FLUSH 100 UNIT/ML IV SOLN
INTRAVENOUS | Status: AC
  Filled 2021-05-10: qty 5

## 2021-05-10 MED ORDER — IOHEXOL 350 MG/ML SOLN
80.0000 mL | Freq: Once | INTRAVENOUS | Status: AC | PRN
  Administered 2021-05-10: 80 mL via INTRAVENOUS

## 2021-05-13 ENCOUNTER — Telehealth: Payer: Self-pay | Admitting: *Deleted

## 2021-05-13 DIAGNOSIS — C8256 Diffuse follicle center lymphoma, intrapelvic lymph nodes: Secondary | ICD-10-CM

## 2021-05-13 NOTE — Telephone Encounter (Signed)
-----   Message from Orson Slick, MD sent at 05/11/2021 10:07 AM EDT ----- Please let Alyssa Elliott know that her CT Chest/Abdomen/Pelvis shows no evidence of enlarged lymph nodes. Findings are consistent with a complete remission.  ----- Message ----- From: Interface, Rad Results In Sent: 05/11/2021   9:53 AM EDT To: Orson Slick, MD

## 2021-05-13 NOTE — Telephone Encounter (Signed)
[name] patient regarding recent scan.  Spoke with pt and advised the her scan was good-no evidence of enlarged lymph noes. These findings are consistent with complete remission.  Pt very pleased with this. She states she feels great.  Asked if she wanted her port out and she said yes.  Order placed for port removal.  She is aware of 3 month f/u

## 2021-06-01 ENCOUNTER — Encounter

## 2021-06-01 NOTE — Telephone Encounter (Signed)
Please review and prescribe if appropriate. Thank you.   ----------------------------------------------  Last visit 01/20/2021 NP Ermalinda Barrios  Next appointment Nothing scheduled   Previous refill encounter(s)   01/20/2021 Aldactone #30 with 3 refills.     For Pharmacy Admin Tracking Only    Intervention Detail: New Rx: 1, reason: Patient Preference  Time Spent (min): 5      Requested Prescriptions     Pending Prescriptions Disp Refills    spironolactone (ALDACTONE) 50 mg tablet 90 Tablet 0     Sig: Take 1 Tablet by mouth daily.

## 2021-06-03 ENCOUNTER — Other Ambulatory Visit: Payer: Self-pay | Admitting: Physician Assistant

## 2021-06-03 MED ORDER — SODIUM CHLORIDE 0.9 % IV SOLN: INTRAVENOUS | Status: AC

## 2021-06-04 ENCOUNTER — Ambulatory Visit (HOSPITAL_COMMUNITY)
Admission: RE | Admit: 2021-06-04 | Discharge: 2021-06-04 | Disposition: A | Discharge status: Home / Self Care | Payer: Medicare Other | Source: Ambulatory Visit | Attending: Hematology and Oncology | Admitting: Hematology and Oncology

## 2021-06-04 ENCOUNTER — Encounter (HOSPITAL_COMMUNITY): Payer: Self-pay

## 2021-06-04 ENCOUNTER — Other Ambulatory Visit: Payer: Self-pay

## 2021-06-04 DIAGNOSIS — Z452 Encounter for adjustment and management of vascular access device: Secondary | ICD-10-CM | POA: Diagnosis not present

## 2021-06-04 DIAGNOSIS — Z7982 Long term (current) use of aspirin: Secondary | ICD-10-CM | POA: Insufficient documentation

## 2021-06-04 DIAGNOSIS — Z8572 Personal history of non-Hodgkin lymphomas: Secondary | ICD-10-CM | POA: Diagnosis not present

## 2021-06-04 DIAGNOSIS — Z882 Allergy status to sulfonamides status: Secondary | ICD-10-CM | POA: Insufficient documentation

## 2021-06-04 DIAGNOSIS — Z885 Allergy status to narcotic agent status: Secondary | ICD-10-CM | POA: Diagnosis not present

## 2021-06-04 DIAGNOSIS — Z881 Allergy status to other antibiotic agents status: Secondary | ICD-10-CM | POA: Insufficient documentation

## 2021-06-04 DIAGNOSIS — Z880 Allergy status to penicillin: Secondary | ICD-10-CM | POA: Diagnosis not present

## 2021-06-04 DIAGNOSIS — Z79899 Other long term (current) drug therapy: Secondary | ICD-10-CM | POA: Diagnosis not present

## 2021-06-04 DIAGNOSIS — C8256 Diffuse follicle center lymphoma, intrapelvic lymph nodes: Secondary | ICD-10-CM | POA: Insufficient documentation

## 2021-06-04 DIAGNOSIS — Z91040 Latex allergy status: Secondary | ICD-10-CM | POA: Insufficient documentation

## 2021-06-04 HISTORY — PX: IR REMOVAL TUN ACCESS W/ PORT W/O FL MOD SED: IMG2290

## 2021-06-04 MED ORDER — FENTANYL CITRATE (PF) 100 MCG/2ML IJ SOLN
INTRAMUSCULAR | Status: AC
  Filled 2021-06-04: qty 2

## 2021-06-04 MED ORDER — LIDOCAINE-EPINEPHRINE 1 %-1:100000 IJ SOLN
INTRAMUSCULAR | Status: AC
  Administered 2021-06-04: 7 mL via SUBCUTANEOUS
  Filled 2021-06-04: qty 1

## 2021-06-04 MED ORDER — FENTANYL CITRATE (PF) 100 MCG/2ML IJ SOLN
INTRAMUSCULAR | Status: DC | PRN
  Administered 2021-06-04: 50 ug via INTRAVENOUS

## 2021-06-04 MED ORDER — MIDAZOLAM HCL 2 MG/2ML IJ SOLN
INTRAMUSCULAR | Status: DC | PRN
  Administered 2021-06-04: 1 mg via INTRAVENOUS

## 2021-06-04 MED ORDER — MIDAZOLAM HCL 2 MG/2ML IJ SOLN
INTRAMUSCULAR | Status: AC
  Filled 2021-06-04: qty 2

## 2021-06-04 NOTE — H&P (Signed)
Chief Complaint: Patient was seen in consultation today for removal of port-a-cath at the request of Dorsey,John T IV  Referring Physician(s): Dorsey,John T IV  Supervising Physician: Juliet Rude  Patient Status: [place]  History of Present Illness: Alyssa Elliott is a 70 y.o. female with history of follicular lymphoma.  She has completed her treatment therapies and no longer needs her port.  She presents today to have it removed.  She is NPO, has a driver to go home, and someone to stay with her for the next 24 hours.  She says she feels well today and has no physical complaints.  Past Medical History:  Diagnosis Date   Allergy    Anxiety    Asthma    Cataract 2018   removed   Complication of anesthesia    drop in BP   DDD (degenerative disc disease)    Depression    GERD (gastroesophageal reflux disease)    Hemorrhoids    Hyperlipidemia    Lymphoma ([place]) dx 2021   no tx for now   Vitamin D deficiency     Past Surgical History:  Procedure Laterality Date   ATRIAL ABLATION SURGERY  1997   SVT   cataract surgery Bilateral 2017   COLONOSCOPY  2016   FOOT NEUROMA SURGERY     multiple times bilaterally   IR IMAGING GUIDED PORT INSERTION  07/13/2020   PLANTAR FASCIA RELEASE Left 1999 or 2000   SHOULDER ARTHROSCOPY Left 04/19/2019   Procedure: LEFT SHOULDER MANIPULATION UNDER ANESTHESIA AND ARTHROSCOPY WITH EXTENSIVE DEBRIDEMENT;  Surgeon: Mcarthur Rossetti, MD;  Location: [facility];  Service: Orthopedics;  Laterality: Left;   UPPER GI ENDOSCOPY  yrs ago    Allergies: Bupropion, Codeine, Levofloxacin, Penicillins, Keflex [cephalexin], Latex, and Sulfa antibiotics  Medications: Prior to Admission medications   Medication Sig Start Date End Date Taking? Authorizing Provider  ARIPiprazole (ABILIFY) 2 MG tablet Take 2 mg by mouth daily.  01/01/19  Yes [provider]  aspirin 81 MG tablet Take 81 mg by mouth at bedtime.    Yes  [provider]  Melatonin 5 MG TABS Take 20 mg by mouth at bedtime.    Yes [provider]  pantoprazole (PROTONIX) 40 MG tablet Take 40 mg by mouth 2 (two) times daily. 02/05/19  Yes [provider]  PARoxetine (PAXIL) 40 MG tablet Take 40 mg by mouth daily.  05/24/15  Yes [provider]  SINGULAIR 10 MG tablet Take 10 mg by mouth at bedtime.  04/08/11  Yes [provider]  Vitamin D, Ergocalciferol, (DRISDOL) 50000 UNITS CAPS Take 50,000 Units by mouth every Monday.  04/25/11  Yes [provider]  acetaminophen (TYLENOL) 500 MG tablet Take 1,000 mg by mouth every 6 (six) hours as needed for moderate pain or headache.    [provider]  [employer] FIBER GUMMIES PO Take 2 each by mouth daily.     [provider]  EPINEPHrine 0.3 mg/0.3 mL IJ SOAJ injection Inject 0.3 mg into the muscle as needed for anaphylaxis.  12/28/18   [provider]  GLUCOSAMINE-CHONDROITIN-MSM PO Take 1 tablet by mouth 2 (two) times a day.    [provider]  levalbuterol (XOPENEX HFA) 45 MCG/ACT inhaler Inhale 1 puff into the lungs every 4 (four) hours as needed for wheezing or shortness of breath. Patient not taking: Reported on 07/17/2020 09/02/19   Rigoberto Noel, MD  NONFORMULARY OR COMPOUNDED ITEM Estradiol 0.02%  Insert 1 gram vaginally 2 times per week. 3 month supply with 3 refills. Patient not taking: Reported on 07/17/2020 08/06/19   Salvadore Dom, MD  pravastatin (PRAVACHOL) 40 MG tablet Take 40 mg by mouth at bedtime.  04/25/11   [provider]     Family History  Problem Relation Age of Onset   Heart disease Mother    Diabetes Mother    Tuberculosis Paternal Grandfather    Bone cancer Paternal Grandfather    Breast cancer Sister 2   Melanoma Sister    Cirrhosis Maternal Grandfather    Heart failure Paternal Grandmother    Colon cancer Neg Hx    Ovarian cancer Neg Hx    Uterine cancer Neg Hx     Social  History   Socioeconomic History   Marital status: Married    Spouse name: Not on file   Number of children: 0   Years of education: Not on file   Highest education level: Not on file  Occupational History   Occupation: retired Catering manager  Tobacco Use   Smoking status: Never   Smokeless tobacco: Never  Vaping Use   Vaping Use: Never used  Substance and Sexual Activity   Alcohol use: No   Drug use: No   Sexual activity: Not Currently    Partners: Male    Birth control/protection: Post-menopausal  Other Topics Concern   Not on file  Social History Narrative   Not on file   Social Determinants of Health   Financial Resource Strain: Not on file  Food Insecurity: Not on file  Transportation Needs: Not on file  Physical Activity: Not on file  Stress: Not on file  Social Connections: Not on file    Review of Systems: A 12 point ROS discussed and pertinent positives are indicated in the HPI above.  All other systems are negative.  Review of Systems  Constitutional: Negative.   HENT: Negative.    Respiratory: Negative.    Cardiovascular: Negative.   Gastrointestinal: Negative.   Genitourinary: Negative.   Neurological: Negative.   Psychiatric/Behavioral: Negative.     Vital Signs: BP 130/74   Pulse 72   Temp 98.8 F (37.1 C) (Oral)   Resp 18   Ht '5\' 4"'$  (1.626 m)   Wt 156 lb (70.8 kg)   LMP 09/04/2002 (Exact Date)   SpO2 98%   BMI 26.78 kg/m   Physical Exam Constitutional:      General: She is not in acute distress.    Appearance: Normal appearance.  HENT:     Head: Normocephalic and atraumatic.     Mouth/Throat:     Mouth: Mucous membranes are moist.     Pharynx: Oropharynx is clear.  Cardiovascular:     Rate and Rhythm: Normal rate and regular rhythm.     Pulses: Normal pulses.     Heart sounds: Normal heart sounds.  Pulmonary:     Effort: Pulmonary effort is normal.     Breath sounds: Normal breath sounds.  Abdominal:     Palpations: Abdomen  is soft.  Skin:    General: Skin is warm and dry.     Capillary Refill: Capillary refill takes less than 2 seconds.  Neurological:     General: No focal deficit present.     Mental Status: She is alert and oriented to person, place, and time.  Psychiatric:        Mood and Affect: Mood normal.  Behavior: Behavior normal.    Imaging: CT CHEST ABDOMEN PELVIS W CONTRAST  Result Date: 05/11/2021 CLINICAL DATA:  Follicular lymphoma, assess treatment response EXAM: CT CHEST, ABDOMEN, AND PELVIS WITH CONTRAST TECHNIQUE: Multidetector CT imaging of the chest, abdomen and pelvis was performed following the standard protocol during bolus administration of intravenous contrast. CONTRAST:  60m OMNIPAQUE IOHEXOL 350 MG/ML SOLN, additional oral enteric contrast COMPARISON:  PET-CT, 09/28/2020 FINDINGS: CT CHEST FINDINGS Cardiovascular: Right chest port catheter. Normal heart size. No pericardial effusion. Mediastinum/Nodes: No enlarged mediastinal, hilar, or axillary lymph nodes. Thyroid gland, trachea, and esophagus demonstrate no significant findings. Lungs/Pleura: Lungs are clear. No pleural effusion or pneumothorax. Musculoskeletal: No chest wall mass or suspicious bone lesions identified. CT ABDOMEN PELVIS FINDINGS Hepatobiliary: No solid liver abnormality is seen. No gallstones, gallbladder wall thickening, or biliary dilatation. Pancreas: Unremarkable. No pancreatic ductal dilatation or surrounding inflammatory changes. Spleen: Normal in size without significant abnormality. Adrenals/Urinary Tract: Adrenal glands are unremarkable. Kidneys are normal, without renal calculi, solid lesion, or hydronephrosis. Bladder is unremarkable. Stomach/Bowel: Stomach is within normal limits. Appendix appears normal. No evidence of bowel wall thickening, distention, or inflammatory changes. Vascular/Lymphatic: No significant vascular findings are present. No enlarged abdominal or pelvic lymph nodes. Reproductive: No  mass or other abnormality. Other: No abdominal wall hernia or abnormality. No abdominopelvic ascites. Musculoskeletal: No acute or significant osseous findings. IMPRESSION: No evidence of lymphadenopathy in the chest, abdomen, or pelvis. Electronically Signed   By: AEddie CandleM.D.   On: 05/11/2021 09:51    Labs:  CBC: Recent Labs    10/23/20 0900 12/25/20 0840 02/26/21 1115 04/28/21 1136  WBC 7.9 3.3* 3.2* 4.2  HGB 14.1 13.2 14.3 15.0  HCT 42.9 40.0 41.5 44.5  PLT 191 195 208 210    COAGS: Recent Labs    07/17/20 1720 07/18/20 0410  INR 1.1 1.2  APTT 30  --     BMP: Recent Labs    10/23/20 0900 12/25/20 0840 02/26/21 1115 04/28/21 1136  NA 142 145 141 144  K 3.9 3.8 4.1 4.6  CL 108 109 107 108  CO2 '26 23 24 27  '$ GLUCOSE 97 96 130* 97  BUN '9 11 12 11  '$ CALCIUM 8.8* 8.4* 9.3 9.8  CREATININE 0.77 0.78 0.78 0.86  GFRNONAA >60 >60 >60 >60    LIVER FUNCTION TESTS: Recent Labs    10/23/20 0900 12/25/20 0840 02/26/21 1115 04/28/21 1136  BILITOT 0.4 0.4 0.3 0.6  AST '20 25 24 20  '$ ALT '30 22 22 18  '$ ALKPHOS 95 89 106 103  PROT 5.9* 5.8* 6.5 6.6  ALBUMIN 3.3* 3.4* 3.5 3.8    Assessment and Plan:  History of Follicular Lymphoma, now complete with treatment. --Ready to proceed with port removal  Risks and benefits of image guided removal of port-a-catheter placement was discussed with the patient including, but not limited to bleeding, infection, pneumothorax, or fibrin sheath development and need for additional procedures.  All of the patient's questions were answered, patient is agreeable to proceed. Consent signed and in chart.   Thank you for this interesting consult.  I greatly enjoyed meeting CSilpa Barhamand look forward to participating in their care.  A copy of this report was sent to the requesting provider on this date.  Electronically Signed: HPasty Spillers PA 06/04/2021, 11:37 AM   I spent a total of  25 Minutes in face to face in  clinical consultation, greater than 50% of which was counseling/coordinating care for image-guided  removal of port-a-catheter.

## 2021-06-04 NOTE — Discharge Instructions (Signed)
Please call Interventional Radiology clinic [phone] with any questions or concerns.  You may remove your dressing and shower tomorrow.   Implanted Port Removal, Care After This sheet gives you information about how to care for yourself after your procedure. Your health care provider may also give you more specific instructions. If you have problems or questions, contact your health care provider. What can I expect after the procedure? After the procedure, it is common to have: Soreness or pain near your incision. Some swelling or bruising near your incision. Follow these instructions at home: Medicines Take over-the-counter and prescription medicines only as told by your health care provider. If you were prescribed an antibiotic medicine, take it as told by your health care provider. Do not stop taking the antibiotic even if you start to feel better. Bathing Do not take baths, swim, or use a hot tub until your health care provider approves. Ask your health care provider if you can take showers. You may only be allowed to take sponge baths. Incision care Follow instructions from your health care provider about how to take care of your incision. Make sure you: Wash your hands with soap and water before you change your bandage (dressing). If soap and water are not available, use hand sanitizer. Change your dressing as told by your health care provider. Keep your dressing dry. Leave stitches (sutures), skin glue, or adhesive strips in place. These skin closures may need to stay in place for 2 weeks or longer. If adhesive strip edges start to loosen and curl up, you may trim the loose edges. Do not remove adhesive strips completely unless your health care provider tells you to do that. Check your incision area every day for signs of infection. Check for: More redness, swelling, or pain. More fluid or blood. Warmth. Pus or a bad smell.    Driving Do not drive for 24 hours if you were  given a medicine to help you relax (sedative) during your procedure. If you did not receive a sedative, ask your health care provider when it is safe to drive.    Activity Return to your normal activities as told by your health care provider. Ask your health care provider what activities are safe for you. Do not lift anything that is heavier than 10 lb (4.5 kg), or the limit that you are told, until your health care provider says that it is safe. Do not do activities that involve lifting your arms over your head. General instructions Do not use any products that contain nicotine or tobacco, such as cigarettes and e-cigarettes. These can delay healing. If you need help quitting, ask your health care provider. Keep all follow-up visits as told by your health care provider. This is important. Contact a health care provider if: You have more redness, swelling, or pain around your incision. You have more fluid or blood coming from your incision. Your incision feels warm to the touch. You have pus or a bad smell coming from your incision. You have pain that is not relieved by your pain medicine. Get help right away if you have: A fever or chills. Chest pain. Difficulty breathing. Summary After the procedure, it is common to have pain, soreness, swelling, or bruising near your incision. If you were prescribed an antibiotic medicine, take it as told by your health care provider. Do not stop taking the antibiotic even if you start to feel better. Do not drive for 24 hours if you were given a sedative during   your procedure. Return to your normal activities as told by your health care provider. Ask your health care provider what activities are safe for you. This information is not intended to replace advice given to you by your health care provider. Make sure you discuss any questions you have with your health care provider. Document Revised: 10/19/2017 Document Reviewed: 10/19/2017 Elsevier Patient  Education  2021 Elsevier Inc.   Moderate Conscious Sedation, Adult, Care After This sheet gives you information about how to care for yourself after your procedure. Your health care provider may also give you more specific instructions. If you have problems or questions, contact your health care provider. What can I expect after the procedure? After the procedure, it is common to have: Sleepiness for several hours. Impaired judgment for several hours. Difficulty with balance. Vomiting if you eat too soon. Follow these instructions at home: For the time period you were told by your health care provider: Rest. Do not participate in activities where you could fall or become injured. Do not drive or use machinery. Do not drink alcohol. Do not take sleeping pills or medicines that cause drowsiness. Do not make important decisions or sign legal documents. Do not take care of children on your own.        Eating and drinking Follow the diet recommended by your health care provider. Drink enough fluid to keep your urine pale yellow. If you vomit: Drink water, juice, or soup when you can drink without vomiting. Make sure you have little or no nausea before eating solid foods.    General instructions Take over-the-counter and prescription medicines only as told by your health care provider. Have a responsible adult stay with you for the time you are told. It is important to have someone help care for you until you are awake and alert. Do not smoke. Keep all follow-up visits as told by your health care provider. This is important. Contact a health care provider if: You are still sleepy or having trouble with balance after 24 hours. You feel light-headed. You keep feeling nauseous or you keep vomiting. You develop a rash. You have a fever. You have redness or swelling around the IV site. Get help right away if: You have trouble breathing. You have new-onset confusion at  home. Summary After the procedure, it is common to feel sleepy, have impaired judgment, or feel nauseous if you eat too soon. Rest after you get home. Know the things you should not do after the procedure. Follow the diet recommended by your health care provider and drink enough fluid to keep your urine pale yellow. Get help right away if you have trouble breathing or new-onset confusion at home. This information is not intended to replace advice given to you by your health care provider. Make sure you discuss any questions you have with your health care provider. Document Revised: 01/03/2020 Document Reviewed: 08/01/2019 Elsevier Patient Education  2021 Elsevier Inc.   

## 2021-06-04 NOTE — Procedures (Signed)
Interventional Radiology Procedure Note  Date of Procedure: 06/04/2021  Procedure: Port removal   Findings:  1. Successful right chest port removal    Complications: No immediate complications noted.   Estimated Blood Loss: minimal  Follow-up and Recommendations: 1. None    Albin Felling, MD  Vascular & Interventional Radiology  06/04/2021 12:12 PM

## 2021-06-10 DIAGNOSIS — Z23 Encounter for immunization: Secondary | ICD-10-CM | POA: Diagnosis not present

## 2021-06-10 MED ORDER — SPIRONOLACTONE 50 MG TAB
50 mg | ORAL_TABLET | Freq: Every day | ORAL | 0 refills | Status: DC
Start: 2021-06-10 — End: 2021-09-13

## 2021-07-29 ENCOUNTER — Other Ambulatory Visit: Payer: Medicare Other

## 2021-07-29 ENCOUNTER — Ambulatory Visit: Payer: Medicare Other | Admitting: Hematology and Oncology

## 2021-08-19 ENCOUNTER — Other Ambulatory Visit: Payer: Self-pay | Admitting: *Deleted

## 2021-08-19 ENCOUNTER — Inpatient Hospital Stay: Payer: Medicare Other | Attending: Hematology and Oncology

## 2021-08-19 ENCOUNTER — Other Ambulatory Visit: Payer: Self-pay

## 2021-08-19 ENCOUNTER — Inpatient Hospital Stay (HOSPITAL_BASED_OUTPATIENT_CLINIC_OR_DEPARTMENT_OTHER): Payer: Medicare Other | Admitting: Hematology and Oncology

## 2021-08-19 VITALS — BP 114/69 | HR 85 | Temp 98.1°F | Resp 17 | Ht 64.0 in | Wt 156.3 lb

## 2021-08-19 DIAGNOSIS — D559 Anemia due to enzyme disorder, unspecified: Secondary | ICD-10-CM | POA: Insufficient documentation

## 2021-08-19 DIAGNOSIS — J45909 Unspecified asthma, uncomplicated: Secondary | ICD-10-CM | POA: Insufficient documentation

## 2021-08-19 DIAGNOSIS — Z7982 Long term (current) use of aspirin: Secondary | ICD-10-CM | POA: Insufficient documentation

## 2021-08-19 DIAGNOSIS — C8510 Unspecified B-cell lymphoma, unspecified site: Secondary | ICD-10-CM | POA: Diagnosis not present

## 2021-08-19 DIAGNOSIS — Z79899 Other long term (current) drug therapy: Secondary | ICD-10-CM | POA: Diagnosis not present

## 2021-08-19 DIAGNOSIS — Z803 Family history of malignant neoplasm of breast: Secondary | ICD-10-CM | POA: Diagnosis not present

## 2021-08-19 DIAGNOSIS — C8256 Diffuse follicle center lymphoma, intrapelvic lymph nodes: Secondary | ICD-10-CM

## 2021-08-19 DIAGNOSIS — R5383 Other fatigue: Secondary | ICD-10-CM | POA: Diagnosis not present

## 2021-08-19 DIAGNOSIS — C8253 Diffuse follicle center lymphoma, intra-abdominal lymph nodes: Secondary | ICD-10-CM

## 2021-08-19 DIAGNOSIS — K219 Gastro-esophageal reflux disease without esophagitis: Secondary | ICD-10-CM | POA: Diagnosis not present

## 2021-08-19 DIAGNOSIS — E785 Hyperlipidemia, unspecified: Secondary | ICD-10-CM | POA: Diagnosis not present

## 2021-08-19 DIAGNOSIS — E559 Vitamin D deficiency, unspecified: Secondary | ICD-10-CM | POA: Diagnosis not present

## 2021-08-19 LAB — CMP (CANCER CENTER ONLY)
ALT: 16 U/L (ref 0–44)
AST: 20 U/L (ref 15–41)
Albumin: 3.6 g/dL (ref 3.5–5.0)
Alkaline Phosphatase: 106 U/L (ref 38–126)
Anion gap: 8 (ref 5–15)
BUN: 11 mg/dL (ref 8–23)
CO2: 26 mmol/L (ref 22–32)
Calcium: 8.7 mg/dL — ABNORMAL LOW (ref 8.9–10.3)
Chloride: 111 mmol/L (ref 98–111)
Creatinine: 0.82 mg/dL (ref 0.44–1.00)
GFR, Estimated: >60 mL/min (ref >60)
Glucose, Bld: 76 mg/dL (ref 70–99)
Potassium: 4 mmol/L (ref 3.5–5.1)
Sodium: 145 mmol/L (ref 135–145)
Total Bilirubin: 0.4 mg/dL (ref 0.3–1.2)
Total Protein: 6.3 g/dL — ABNORMAL LOW (ref 6.5–8.1)

## 2021-08-19 LAB — CBC WITH DIFFERENTIAL (CANCER CENTER ONLY)
Abs Immature Granulocytes: 0.06 10*3/uL (ref 0.00–0.07)
Basophils Absolute: 0 10*3/uL (ref 0.0–0.1)
Basophils Relative: 1 %
Eosinophils Absolute: 0.1 10*3/uL (ref 0.0–0.5)
Eosinophils Relative: 4 %
HCT: 40.2 % (ref 36.0–46.0)
Hemoglobin: 13.8 g/dL (ref 12.0–15.0)
Immature Granulocytes: 2 %
Lymphocytes Relative: 32 %
Lymphs Abs: 0.9 10*3/uL (ref 0.7–4.0)
MCH: 30.7 pg (ref 26.0–34.0)
MCHC: 34.3 g/dL (ref 30.0–36.0)
MCV: 89.3 fL (ref 80.0–100.0)
Monocytes Absolute: 0.7 10*3/uL (ref 0.1–1.0)
Monocytes Relative: 23 %
Neutro Abs: 1.1 10*3/uL — ABNORMAL LOW (ref 1.7–7.7)
Neutrophils Relative %: 38 %
Platelet Count: 205 10*3/uL (ref 150–400)
RBC: 4.5 MIL/uL (ref 3.87–5.11)
RDW: 12.7 % (ref 11.5–15.5)
WBC Count: 2.8 10*3/uL — ABNORMAL LOW (ref 4.0–10.5)
nRBC: 0 % (ref 0.0–0.2)

## 2021-08-19 LAB — LACTATE DEHYDROGENASE: LDH: 334 U/L — ABNORMAL HIGH (ref 98–192)

## 2021-08-23 ENCOUNTER — Encounter: Payer: Self-pay | Admitting: Hematology and Oncology

## 2021-08-23 NOTE — Progress Notes (Signed)
[name] [phone]   [phone]  PROGRESS NOTE  Patient Care Team: Alyssa Solian, MD as PCP - General (Internal Medicine) Alyssa Rossetti, MD as Consulting Physician (Orthopedic Surgery)  Hematological/Oncological History # Non-Hodgkin B Cell Lymphoma, Follicular Lymphoma. Stage III 1) 02/25/2020: patient underwent excision of a 1.3 cm mobile nodule on the left vertex of the scalp. 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  2) 03/18/2020: Establish care with Dr. Lorenso Elliott  3) 03/30/2020: PET CT scan showed  involvement of the left scalp, left axillary lymph node, left hilar and infrahilar nodes, subcarinal node, and mesenteric lymph nodes, as well as a masslike appearance/ high metabolic activity in the lower uterus/cervix.  4) 04/21/2020: biopsy of the mass in the lower uterus/cervix consistent with follicular lymphoma.  5) 06/25/2020: CT C/A/P showed the soft tissue mass in the abdomen increase in size from 5.8 x 3.6 cm to 6.5 x 5.7 cm. The other lymphadenopathy was stable. Patient elected to move forward with treatment.  6) 07/06/2020: Cycle 1 Day 1 of Rituximab/Bendamustine 7) 07/17/2020-07/22/2020: Admitted to [facility] with fevers. Infectious workup negative, but scans show mesenteric panniculitis (fibrosing mesenteritis). Started on prednisone 40mg  PO daily.  8) 08/06/2020-08/27/2020: Weekly Rituximab monotherapy.  9) 09/28/2020: PET CT scan shows no residual disease. Complete response to therapy noted.  10) 10/23/2020: start of rituximab q 8 weeks for consolidation.  11) 12/25/2020: dose 2 of q 8 week rituximab  12) 02/26/2021: dose 3 of q 8 week rituximab  13) 04/28/2021: dose 4 of q 8 week rituximab   Interval History:  Alyssa Elliott 70 y.o. female with medical history significant for diffuse non Hodgkin B cell lymphoma who presents for a follow up visit. The patient's last  visit was on 04/28/2021. In the interim since her last visit she completed rituximab. She presents today for a routine surveillance visit.  On exam today Alyssa Elliott is accompanied by her husband.  She reports that she has been struggling with worsening depression in the interim since her last visit.  She notes that her nutrition is "horrible" and that she is not on a regular diet.  She also had a COVID infection approximate 3 to 4 weeks ago from which she still feels quite fatigued and wiped out.  Fortunately she is not having any signs or symptoms concerning for recurrence of her lymphoma.  She denies having any issues with fevers, chills, sweats, nausea, vomiting or diarrhea.She otherwise has no, questions or concerns.  A full 10 point ROS is listed below.  MEDICAL HISTORY:  Past Medical History:  Diagnosis Date   Allergy    Anxiety    Asthma    Cataract 2018   removed   Complication of anesthesia    drop in BP   DDD (degenerative disc disease)    Depression    GERD (gastroesophageal reflux disease)    Hemorrhoids    Hyperlipidemia    Lymphoma ([place]) dx 2021   no tx for now   Vitamin D deficiency     SURGICAL HISTORY: Past Surgical History:  Procedure Laterality Date   ATRIAL ABLATION SURGERY  1997   SVT   cataract surgery Bilateral 2017   COLONOSCOPY  2016   FOOT NEUROMA SURGERY     multiple times bilaterally   IR IMAGING GUIDED PORT INSERTION  07/13/2020   IR REMOVAL TUN ACCESS W/ PORT W/O FL MOD SED  06/04/2021   PLANTAR FASCIA RELEASE  Left 1999 or 2000   SHOULDER ARTHROSCOPY Left 04/19/2019   Procedure: LEFT SHOULDER MANIPULATION UNDER ANESTHESIA AND ARTHROSCOPY WITH EXTENSIVE DEBRIDEMENT;  Surgeon: Alyssa Rossetti, MD;  Location: [facility];  Service: Orthopedics;  Laterality: Left;   UPPER GI ENDOSCOPY  yrs ago    SOCIAL HISTORY: Social History   Socioeconomic History   Marital status: Married    Spouse name: Not on file   Number of children: 0   Years of  education: Not on file   Highest education level: Not on file  Occupational History   Occupation: retired Catering manager  Tobacco Use   Smoking status: Never   Smokeless tobacco: Never  Vaping Use   Vaping Use: Never used  Substance and Sexual Activity   Alcohol use: No   Drug use: No   Sexual activity: Not Currently    Partners: Male    Birth control/protection: Post-menopausal  Other Topics Concern   Not on file  Social History Narrative   Not on file   Social Determinants of Health   Financial Resource Strain: Not on file  Food Insecurity: Not on file  Transportation Needs: Not on file  Physical Activity: Not on file  Stress: Not on file  Social Connections: Not on file  Intimate Partner Violence: Not on file    FAMILY HISTORY: Family History  Problem Relation Age of Onset   Heart disease Mother    Diabetes Mother    Tuberculosis Paternal Grandfather    Bone cancer Paternal Grandfather    Breast cancer Sister 67   Melanoma Sister    Cirrhosis Maternal Grandfather    Heart failure Paternal Grandmother    Colon cancer Neg Hx    Ovarian cancer Neg Hx    Uterine cancer Neg Hx     ALLERGIES:  is allergic to bupropion, codeine, levofloxacin, penicillins, keflex [cephalexin], latex, and sulfa antibiotics.  MEDICATIONS:  Current Outpatient Medications  Medication Sig Dispense Refill   acetaminophen (TYLENOL) 500 MG tablet Take 1,000 mg by mouth every 6 (six) hours as needed for moderate pain or headache.     ARIPiprazole (ABILIFY) 2 MG tablet Take 2 mg by mouth daily.      aspirin 81 MG tablet Take 81 mg by mouth at bedtime.      [employer] FIBER GUMMIES PO Take 2 each by mouth daily.      EPINEPHrine 0.3 mg/0.3 mL IJ SOAJ injection Inject 0.3 mg into the muscle as needed for anaphylaxis.      levalbuterol (XOPENEX HFA) 45 MCG/ACT inhaler Inhale 1 puff into the lungs every 4 (four) hours as needed for wheezing or shortness of breath. (Patient not taking: Reported on  07/17/2020) 1 Inhaler 3   Melatonin 5 MG TABS Take 20 mg by mouth at bedtime.      NONFORMULARY OR COMPOUNDED ITEM Estradiol 0.02% Insert 1 gram vaginally 2 times per week. 3 month supply with 3 refills. (Patient not taking: Reported on 07/17/2020) 3 each 3   pantoprazole (PROTONIX) 40 MG tablet Take 40 mg by mouth 2 (two) times daily.     PARoxetine (PAXIL) 40 MG tablet Take 40 mg by mouth daily.      pravastatin (PRAVACHOL) 40 MG tablet Take 40 mg by mouth at bedtime.      SINGULAIR 10 MG tablet Take 10 mg by mouth at bedtime.      Vitamin D, Ergocalciferol, (DRISDOL) 50000 UNITS CAPS Take 50,000 Units by mouth every Monday.  No current facility-administered medications for this visit.   Facility-Administered Medications Ordered in Other Visits  Medication Dose Route Frequency Provider Last Rate Last Admin   0.9 %  sodium chloride infusion   Intravenous Continuous Boisseau, Hayley, PA        REVIEW OF SYSTEMS:   Constitutional: ( - ) fevers, ( - )  chills , ( - ) night sweats Eyes: ( - ) blurriness of vision, ( - ) double vision, ( - ) watery eyes Ears, nose, mouth, throat, and face: ( - ) mucositis, ( - ) sore throat Respiratory: ( - ) cough, ( - ) dyspnea, ( - ) wheezes Cardiovascular: ( - ) palpitation, ( - ) chest discomfort, ( - ) lower extremity swelling Gastrointestinal:  ( - ) nausea, ( - ) heartburn, ( - ) change in bowel habits Skin: ( - ) abnormal skin rashes Lymphatics: ( - ) new lymphadenopathy, ( - ) easy bruising Neurological: ( - ) numbness, ( - ) tingling, ( - ) new weaknesses Behavioral/Psych: ( - ) mood change, ( - ) new changes  All other systems were reviewed with the patient and are negative.  PHYSICAL EXAMINATION: GENERAL: well appearing elderly Caucasian female. alert, no distress and comfortable SKIN: scalp lesion reduced to a concave dent in scalp. skin color, texture, turgor are normal, no rashes or significant lesions EYES: conjunctiva are pink and  non-injected, sclera clear LYMPH:  no palpable lymphadenopathy in the cervical, axillary or supraclavicular LUNGS: clear to auscultation and percussion with normal breathing effort HEART: regular rate & rhythm and no murmurs and no lower extremity edema Musculoskeletal: no cyanosis of digits and no clubbing  PSYCH: alert & oriented x 3, fluent speech NEURO: no focal motor/sensory deficits  LABORATORY DATA:  I have reviewed the data as listed CBC Latest Ref Rng & Units 08/19/2021 04/28/2021 02/26/2021  WBC 4.0 - 10.5 K/uL 2.8(L) 4.2 3.2(L)  Hemoglobin 12.0 - 15.0 g/dL 13.8 15.0 14.3  Hematocrit 36.0 - 46.0 % 40.2 44.5 41.5  Platelets 150 - 400 K/uL 205 210 208    CMP Latest Ref Rng & Units 08/19/2021 04/28/2021 02/26/2021  Glucose 70 - 99 mg/dL 76 97 130(H)  BUN 8 - 23 mg/dL 11 11 12   Creatinine 0.44 - 1.00 mg/dL 0.82 0.86 0.78  Sodium 135 - 145 mmol/L 145 144 141  Potassium 3.5 - 5.1 mmol/L 4.0 4.6 4.1  Chloride 98 - 111 mmol/L 111 108 107  CO2 22 - 32 mmol/L 26 27 24   Calcium 8.9 - 10.3 mg/dL 8.7(L) 9.8 9.3  Total Protein 6.5 - 8.1 g/dL 6.3(L) 6.6 6.5  Total Bilirubin 0.3 - 1.2 mg/dL 0.4 0.6 0.3  Alkaline Phos 38 - 126 U/L 106 103 106  AST 15 - 41 U/L 20 20 24   ALT 0 - 44 U/L 16 18 22     No results found for: MPROTEIN No results found for: KPAFRELGTCHN, LAMBDASER, KAPLAMBRATIO   RADIOGRAPHIC STUDIES:  No results found.  ASSESSMENT & PLAN Alyssa Elliott 70 y.o. female with medical history significant for diffuse non Hodgkin B cell lymphoma who presents for a follow up visit.  After review of the imaging, review the labs, discussion with the patient her findings are most consistent with a diffuse non-Hodgkin B-cell lymphoma, follicular lymphoma.  The initial diagnosis based off the biopsy from the lesion on the head was that of a follicle cell lymphoma which tends to be a cutaneous lymphoma, however this diagnosis was considerably less  likely given the diffuse spread  throughout the lymph nodes of the chest and abdomen. Biopsy of the FDG avid lesion in the pelvis resulted with follicular lymphoma.   Previously we discussed the diagnosis of follicular lymphoma and the options moving forward.  She currently does not meet any GELF Criteria and therefore is not required to start therapy.  We discussed that treatment could be started if she so desired and we discussed the risks and benefits of bendamustine rituximab therapy.  She voiced her understanding of the different options between starting treatment and continued observation.  After discussion with her husband she noted she would like to start treatment given the continued growth of the lesion which was abutting the urinary bladder.   The regimen of choice for this patient is Rituximab monotherapy with Rituximab 375mg /m2 IV weekly x 4 weeks, followed by IV rituximab q 8 weeks thereafter. This is being administered with curative intent. We transitioned to this regimen from bendamustine rituximab due to intolerance and hospitalization following her first cycle of treatment.  # Non-Hodgkin B Cell Lymphoma, Follicular Lymphoma. Stage III --pre treatment CT findings showed lymphadenopathy on both sides of the diaphragm. Consistent with at least a Stage III lymphoma -- confirmed the diagnosis with biopsy of the FDG avid mass near the uterus/cervix. Cancel the left axillary lymph node biopsy --started treatment with R-Benda, however the patient ended up hospitalized due to fever, weakness, and marked drop in her labs.  She did not tolerate rituximab and bendamustine combination altogether and therefore we proceeded with rituximab monotherapy alone. She completed Rituximab weekly x 4 weeks.  Plan:  --CT imaging recommend q 6 months x 2 years.  Next CT scan due in 3 months time. -- Patient has completed maintenance rituximab --RTC in 3 months time   #Fibrosing Mesenteritis (Mesenteric Panniculitis), resolved --improved  with prednisone taper, complete resolution of symptoms.  --completed steroid taper.  --continue to monitor   #Supportive Care --port removed --zofran 8mg  q8H PRN and compazine 10mg  PO q6H for nausea -- EMLA cream for port -- no pain medication required at this time.   No orders of the defined types were placed in this encounter.   All questions were answered. The patient knows to call the clinic with any problems, questions or concerns.  A total of more than 30 minutes were spent on this encounter and over half of that time was spent on counseling and coordination of care as outlined above.   Ledell Peoples, MD Department of Hematology/Oncology [facility] [place] [facility] Phone: [phone] Pager: [phone] Email: [email or web address]  08/23/2021 3:41 PM

## 2021-09-11 ENCOUNTER — Encounter

## 2021-09-13 MED ORDER — SPIRONOLACTONE 50 MG TAB
50 mg | ORAL_TABLET | Freq: Every day | ORAL | 0 refills | Status: AC
Start: 2021-09-13 — End: 2021-12-20

## 2021-09-27 ENCOUNTER — Other Ambulatory Visit: Payer: Self-pay | Admitting: Obstetrics and Gynecology

## 2021-09-27 DIAGNOSIS — Z1231 Encounter for screening mammogram for malignant neoplasm of breast: Secondary | ICD-10-CM

## 2021-10-05 ENCOUNTER — Inpatient Hospital Stay: Payer: Medicare Other | Attending: Hematology and Oncology

## 2021-10-05 ENCOUNTER — Other Ambulatory Visit: Payer: Self-pay

## 2021-10-05 ENCOUNTER — Other Ambulatory Visit: Payer: Self-pay | Admitting: Hematology and Oncology

## 2021-10-05 DIAGNOSIS — C8253 Diffuse follicle center lymphoma, intra-abdominal lymph nodes: Secondary | ICD-10-CM

## 2021-10-05 DIAGNOSIS — C8510 Unspecified B-cell lymphoma, unspecified site: Secondary | ICD-10-CM | POA: Diagnosis not present

## 2021-10-05 LAB — CBC WITH DIFFERENTIAL (CANCER CENTER ONLY)
Abs Immature Granulocytes: 0.11 10*3/uL — ABNORMAL HIGH (ref 0.00–0.07)
Basophils Absolute: 0.1 10*3/uL (ref 0.0–0.1)
Basophils Relative: 3 %
Eosinophils Absolute: 0.1 10*3/uL (ref 0.0–0.5)
Eosinophils Relative: 5 %
HCT: 43.3 % (ref 36.0–46.0)
Hemoglobin: 14.6 g/dL (ref 12.0–15.0)
Immature Granulocytes: 6 %
Lymphocytes Relative: 33 %
Lymphs Abs: 0.6 10*3/uL — ABNORMAL LOW (ref 0.7–4.0)
MCH: 30.7 pg (ref 26.0–34.0)
MCHC: 33.7 g/dL (ref 30.0–36.0)
MCV: 91 fL (ref 80.0–100.0)
Monocytes Absolute: 0.4 10*3/uL (ref 0.1–1.0)
Monocytes Relative: 19 %
Neutro Abs: 0.6 10*3/uL — ABNORMAL LOW (ref 1.7–7.7)
Neutrophils Relative %: 34 %
Platelet Count: 225 10*3/uL (ref 150–400)
RBC: 4.76 MIL/uL (ref 3.87–5.11)
RDW: 13.1 % (ref 11.5–15.5)
Smear Review: NORMAL
WBC Count: 1.8 10*3/uL — ABNORMAL LOW (ref 4.0–10.5)
nRBC: 0 % (ref 0.0–0.2)

## 2021-10-05 LAB — CMP (CANCER CENTER ONLY)
ALT: 15 U/L (ref 0–44)
AST: 20 U/L (ref 15–41)
Albumin: 4.1 g/dL (ref 3.5–5.0)
Alkaline Phosphatase: 93 U/L (ref 38–126)
Anion gap: 8 (ref 5–15)
BUN: 14 mg/dL (ref 8–23)
CO2: 28 mmol/L (ref 22–32)
Calcium: 9.3 mg/dL (ref 8.9–10.3)
Chloride: 106 mmol/L (ref 98–111)
Creatinine: 0.94 mg/dL (ref 0.44–1.00)
GFR, Estimated: >60 mL/min (ref >60)
Glucose, Bld: 105 mg/dL — ABNORMAL HIGH (ref 70–99)
Potassium: 3.7 mmol/L (ref 3.5–5.1)
Sodium: 142 mmol/L (ref 135–145)
Total Bilirubin: 0.5 mg/dL (ref 0.3–1.2)
Total Protein: 6.6 g/dL (ref 6.5–8.1)

## 2021-10-05 LAB — LACTATE DEHYDROGENASE: LDH: 281 U/L — ABNORMAL HIGH (ref 98–192)

## 2021-10-13 ENCOUNTER — Telehealth: Payer: Self-pay

## 2021-10-13 NOTE — Telephone Encounter (Signed)
Called pt to advise of CT scan on 11/12/21 @ 11 AM. CT specific instructions and two bottles of contrast left at from desk. Pt to pick up contrast on 10/15/21. Also, reviewed labs drawn on 10/05/21.  No further questions from pt.

## 2021-10-14 ENCOUNTER — Ambulatory Visit
Admission: RE | Admit: 2021-10-14 | Discharge: 2021-10-14 | Disposition: A | Discharge status: Home / Self Care | Payer: Medicare Other | Source: Ambulatory Visit | Attending: Obstetrics and Gynecology | Admitting: Obstetrics and Gynecology

## 2021-10-14 ENCOUNTER — Other Ambulatory Visit: Payer: Self-pay

## 2021-10-14 ENCOUNTER — Ambulatory Visit: Payer: Medicare Other

## 2021-10-14 DIAGNOSIS — Z1231 Encounter for screening mammogram for malignant neoplasm of breast: Secondary | ICD-10-CM

## 2021-10-18 NOTE — Telephone Encounter (Signed)
Gerhard Munch calling from [facility] [phone] reference No. [id number]. Need verification of pt chronic condition.

## 2021-10-21 DIAGNOSIS — E785 Hyperlipidemia, unspecified: Secondary | ICD-10-CM | POA: Diagnosis not present

## 2021-10-21 DIAGNOSIS — E559 Vitamin D deficiency, unspecified: Secondary | ICD-10-CM | POA: Diagnosis not present

## 2021-10-27 DIAGNOSIS — F329 Major depressive disorder, single episode, unspecified: Secondary | ICD-10-CM | POA: Diagnosis not present

## 2021-10-27 DIAGNOSIS — E785 Hyperlipidemia, unspecified: Secondary | ICD-10-CM | POA: Diagnosis not present

## 2021-10-27 DIAGNOSIS — E559 Vitamin D deficiency, unspecified: Secondary | ICD-10-CM | POA: Diagnosis not present

## 2021-10-27 DIAGNOSIS — M7502 Adhesive capsulitis of left shoulder: Secondary | ICD-10-CM | POA: Diagnosis not present

## 2021-10-27 DIAGNOSIS — M5030 Other cervical disc degeneration, unspecified cervical region: Secondary | ICD-10-CM | POA: Diagnosis not present

## 2021-10-27 DIAGNOSIS — D126 Benign neoplasm of colon, unspecified: Secondary | ICD-10-CM | POA: Diagnosis not present

## 2021-10-27 DIAGNOSIS — K219 Gastro-esophageal reflux disease without esophagitis: Secondary | ICD-10-CM | POA: Diagnosis not present

## 2021-10-27 DIAGNOSIS — Z1212 Encounter for screening for malignant neoplasm of rectum: Secondary | ICD-10-CM | POA: Diagnosis not present

## 2021-10-27 DIAGNOSIS — C8253 Diffuse follicle center lymphoma, intra-abdominal lymph nodes: Secondary | ICD-10-CM | POA: Diagnosis not present

## 2021-10-27 DIAGNOSIS — R82998 Other abnormal findings in urine: Secondary | ICD-10-CM | POA: Diagnosis not present

## 2021-10-27 DIAGNOSIS — Z0000 Encounter for general adult medical examination without abnormal findings: Secondary | ICD-10-CM | POA: Diagnosis not present

## 2021-10-27 DIAGNOSIS — J45909 Unspecified asthma, uncomplicated: Secondary | ICD-10-CM | POA: Diagnosis not present

## 2021-11-10 ENCOUNTER — Encounter

## 2021-11-10 MED ORDER — METFORMIN SR 500 MG 24 HR TABLET
500 mg | ORAL_TABLET | ORAL | 3 refills | Status: DC
Start: 2021-11-10 — End: 2021-11-12

## 2021-11-12 ENCOUNTER — Encounter

## 2021-11-12 ENCOUNTER — Encounter (HOSPITAL_COMMUNITY): Payer: Self-pay

## 2021-11-12 ENCOUNTER — Other Ambulatory Visit: Payer: Self-pay

## 2021-11-12 ENCOUNTER — Ambulatory Visit (HOSPITAL_COMMUNITY)
Admission: RE | Admit: 2021-11-12 | Discharge: 2021-11-12 | Disposition: A | Discharge status: Home / Self Care | Payer: Medicare Other | Source: Ambulatory Visit | Attending: Hematology and Oncology | Admitting: Hematology and Oncology

## 2021-11-12 DIAGNOSIS — M19012 Primary osteoarthritis, left shoulder: Secondary | ICD-10-CM | POA: Diagnosis not present

## 2021-11-12 DIAGNOSIS — M19011 Primary osteoarthritis, right shoulder: Secondary | ICD-10-CM | POA: Diagnosis not present

## 2021-11-12 DIAGNOSIS — C8256 Diffuse follicle center lymphoma, intrapelvic lymph nodes: Secondary | ICD-10-CM | POA: Diagnosis not present

## 2021-11-12 DIAGNOSIS — K3189 Other diseases of stomach and duodenum: Secondary | ICD-10-CM | POA: Diagnosis not present

## 2021-11-12 DIAGNOSIS — C8290 Follicular lymphoma, unspecified, unspecified site: Secondary | ICD-10-CM | POA: Diagnosis not present

## 2021-11-12 MED ORDER — METFORMIN SR 500 MG 24 HR TABLET
500 mg | ORAL_TABLET | Freq: Every day | ORAL | 3 refills | Status: AC
Start: 2021-11-12 — End: ?

## 2021-11-12 MED ORDER — IOHEXOL 300 MG/ML  SOLN
100.0000 mL | Freq: Once | INTRAMUSCULAR | Status: AC | PRN
  Administered 2021-11-12: 100 mL via INTRAVENOUS

## 2021-11-12 MED ORDER — SODIUM CHLORIDE (PF) 0.9 % IJ SOLN
INTRAMUSCULAR | Status: AC
  Filled 2021-11-12: qty 50

## 2021-11-12 NOTE — Telephone Encounter (Signed)
 Pt need refill on the medication and for gabapentin   pt need it soon as possible  please.

## 2021-11-15 ENCOUNTER — Telehealth: Payer: Self-pay | Admitting: *Deleted

## 2021-11-15 NOTE — Telephone Encounter (Signed)
-----   Message from Orson Slick, MD sent at 11/14/2021  3:50 PM EST ----- Please let Alyssa Elliott know that her CT scan showed no signs concerning for recurrence of her lymphoma.  We will plan to see her back as scheduled 11/22/2021. ----- Message ----- From: Interface, Rad Results In Sent: 11/14/2021   2:32 PM EST To: Orson Slick, MD

## 2021-11-15 NOTE — Telephone Encounter (Signed)
[name] patient regarding recent scan results. No answer but was able to leave vm message on an identified phone vm. Advised that her CT scan showed no signs of recurrent lymphoma and that we will see her next on 11/22/21 at her scheduled appt. Advised that she could call back with any questions or concerns @ [phone]

## 2021-11-22 ENCOUNTER — Inpatient Hospital Stay: Payer: Medicare Other | Attending: Hematology and Oncology | Admitting: Hematology and Oncology

## 2021-11-22 ENCOUNTER — Other Ambulatory Visit: Payer: Self-pay

## 2021-11-22 ENCOUNTER — Other Ambulatory Visit: Payer: Self-pay | Admitting: Hematology and Oncology

## 2021-11-22 ENCOUNTER — Inpatient Hospital Stay: Payer: Medicare Other

## 2021-11-22 VITALS — BP 127/80 | HR 71 | Temp 98.1°F | Resp 16 | Wt 159.0 lb

## 2021-11-22 DIAGNOSIS — D72819 Decreased white blood cell count, unspecified: Secondary | ICD-10-CM | POA: Diagnosis not present

## 2021-11-22 DIAGNOSIS — Z95828 Presence of other vascular implants and grafts: Secondary | ICD-10-CM

## 2021-11-22 DIAGNOSIS — E785 Hyperlipidemia, unspecified: Secondary | ICD-10-CM | POA: Insufficient documentation

## 2021-11-22 DIAGNOSIS — Z803 Family history of malignant neoplasm of breast: Secondary | ICD-10-CM | POA: Insufficient documentation

## 2021-11-22 DIAGNOSIS — C8253 Diffuse follicle center lymphoma, intra-abdominal lymph nodes: Secondary | ICD-10-CM

## 2021-11-22 DIAGNOSIS — Z7982 Long term (current) use of aspirin: Secondary | ICD-10-CM | POA: Insufficient documentation

## 2021-11-22 DIAGNOSIS — Z79899 Other long term (current) drug therapy: Secondary | ICD-10-CM | POA: Insufficient documentation

## 2021-11-22 DIAGNOSIS — J45909 Unspecified asthma, uncomplicated: Secondary | ICD-10-CM | POA: Insufficient documentation

## 2021-11-22 DIAGNOSIS — C8256 Diffuse follicle center lymphoma, intrapelvic lymph nodes: Secondary | ICD-10-CM

## 2021-11-22 DIAGNOSIS — K219 Gastro-esophageal reflux disease without esophagitis: Secondary | ICD-10-CM | POA: Diagnosis not present

## 2021-11-22 DIAGNOSIS — E559 Vitamin D deficiency, unspecified: Secondary | ICD-10-CM | POA: Insufficient documentation

## 2021-11-22 DIAGNOSIS — C8510 Unspecified B-cell lymphoma, unspecified site: Secondary | ICD-10-CM | POA: Diagnosis not present

## 2021-11-22 LAB — CBC WITH DIFFERENTIAL (CANCER CENTER ONLY)
Abs Immature Granulocytes: 0.01 10*3/uL (ref 0.00–0.07)
Basophils Absolute: 0.1 10*3/uL (ref 0.0–0.1)
Basophils Relative: 2 %
Eosinophils Absolute: 0.1 10*3/uL (ref 0.0–0.5)
Eosinophils Relative: 4 %
HCT: 44.2 % (ref 36.0–46.0)
Hemoglobin: 14.7 g/dL (ref 12.0–15.0)
Immature Granulocytes: 0 %
Lymphocytes Relative: 27 %
Lymphs Abs: 0.9 10*3/uL (ref 0.7–4.0)
MCH: 30.5 pg (ref 26.0–34.0)
MCHC: 33.3 g/dL (ref 30.0–36.0)
MCV: 91.7 fL (ref 80.0–100.0)
Monocytes Absolute: 0.6 10*3/uL (ref 0.1–1.0)
Monocytes Relative: 18 %
Neutro Abs: 1.6 10*3/uL — ABNORMAL LOW (ref 1.7–7.7)
Neutrophils Relative %: 49 %
Platelet Count: 215 10*3/uL (ref 150–400)
RBC: 4.82 MIL/uL (ref 3.87–5.11)
RDW: 12.6 % (ref 11.5–15.5)
WBC Count: 3.3 10*3/uL — ABNORMAL LOW (ref 4.0–10.5)
nRBC: 0 % (ref 0.0–0.2)

## 2021-11-22 LAB — LACTATE DEHYDROGENASE: LDH: 300 U/L — ABNORMAL HIGH (ref 98–192)

## 2021-11-22 LAB — CMP (CANCER CENTER ONLY)
ALT: 19 U/L (ref 0–44)
AST: 25 U/L (ref 15–41)
Albumin: 4.1 g/dL (ref 3.5–5.0)
Alkaline Phosphatase: 87 U/L (ref 38–126)
Anion gap: 5 (ref 5–15)
BUN: 15 mg/dL (ref 8–23)
CO2: 30 mmol/L (ref 22–32)
Calcium: 9.3 mg/dL (ref 8.9–10.3)
Chloride: 108 mmol/L (ref 98–111)
Creatinine: 0.95 mg/dL (ref 0.44–1.00)
GFR, Estimated: >60 mL/min (ref >60)
Glucose, Bld: 88 mg/dL (ref 70–99)
Potassium: 4.3 mmol/L (ref 3.5–5.1)
Sodium: 143 mmol/L (ref 135–145)
Total Bilirubin: 0.4 mg/dL (ref 0.3–1.2)
Total Protein: 6.5 g/dL (ref 6.5–8.1)

## 2021-11-22 NOTE — Progress Notes (Signed)
[place] [phone]   [phone]  PROGRESS NOTE  Patient Care Team: Prince Solian, MD as PCP - General (Internal Medicine) Mcarthur Rossetti, MD as Consulting Physician (Orthopedic Surgery)  Hematological/Oncological History # Non-Hodgkin B Cell Lymphoma, Follicular Lymphoma. Stage III 1) 02/25/2020: patient underwent excision of a 1.3 cm mobile nodule on the left vertex of the scalp. 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  2) 03/18/2020: Establish care with Dr. Lorenso Courier  3) 03/30/2020: PET CT scan showed  involvement of the left scalp, left axillary lymph node, left hilar and infrahilar nodes, subcarinal node, and mesenteric lymph nodes, as well as a masslike appearance/ high metabolic activity in the lower uterus/cervix.  4) 04/21/2020: biopsy of the mass in the lower uterus/cervix consistent with follicular lymphoma.  5) 06/25/2020: CT C/A/P showed the soft tissue mass in the abdomen increase in size from 5.8 x 3.6 cm to 6.5 x 5.7 cm. The other lymphadenopathy was stable. Patient elected to move forward with treatment.  6) 07/06/2020: Cycle 1 Day 1 of Rituximab/Bendamustine 7) 07/17/2020-07/22/2020: Admitted to [facility] with fevers. Infectious workup negative, but scans show mesenteric panniculitis (fibrosing mesenteritis). Started on prednisone '40mg'$  PO daily.  8) 08/06/2020-08/27/2020: Weekly Rituximab monotherapy.  9) 09/28/2020: PET CT scan shows no residual disease. Complete response to therapy noted.  10) 10/23/2020: start of rituximab q 8 weeks for consolidation.  11) 12/25/2020: dose 2 of q 8 week rituximab  12) 02/26/2021: dose 3 of q 8 week rituximab  13) 04/28/2021: dose 4 of q 8 week rituximab   Interval History:  Alyssa Elliott 71 y.o. female with medical history significant for diffuse non Hodgkin B cell lymphoma who presents for a follow up visit. The patient's last  visit was on 08/19/2021. In the interim since her last visit she completed rituximab. She presents today for a routine surveillance visit.   On exam today Alyssa Elliott is unaccompanied.  She reports that she is feeling well in the interim since her last visit.  Her energy levels have improved and her appetite is better.  She notes that her depression symptoms are abating.  She reports that she has not had any issues with viral illnesses, nausea, ming, or diarrhea.  She notes that her allergies are significantly diminished compared to last year and she thinks it may be due to the chemotherapy treatment.  She denies having any issues with fevers, chills, sweats, nausea, vomiting or diarrhea.She otherwise has no, questions or concerns.  A full 10 point ROS is listed below.  MEDICAL HISTORY:  Past Medical History:  Diagnosis Date   Allergy    Anxiety    Asthma    Cataract 2018   removed   Complication of anesthesia    drop in BP   DDD (degenerative disc disease)    Depression    GERD (gastroesophageal reflux disease)    Hemorrhoids    Hyperlipidemia    Lymphoma ([place]) dx 2021   no tx for now   Vitamin D deficiency     SURGICAL HISTORY: Past Surgical History:  Procedure Laterality Date   ATRIAL ABLATION SURGERY  1997   SVT   cataract surgery Bilateral 2017   COLONOSCOPY  2016   FOOT NEUROMA SURGERY     multiple times bilaterally   IR IMAGING GUIDED PORT INSERTION  07/13/2020   IR REMOVAL TUN ACCESS W/ PORT W/O FL MOD SED  06/04/2021   PLANTAR FASCIA RELEASE  Left 1999 or 2000   SHOULDER ARTHROSCOPY Left 04/19/2019   Procedure: LEFT SHOULDER MANIPULATION UNDER ANESTHESIA AND ARTHROSCOPY WITH EXTENSIVE DEBRIDEMENT;  Surgeon: Mcarthur Rossetti, MD;  Location: [facility];  Service: Orthopedics;  Laterality: Left;   UPPER GI ENDOSCOPY  yrs ago    SOCIAL HISTORY: Social History   Socioeconomic History   Marital status: Married    Spouse name: Not on file   Number of children: 0    Years of education: Not on file   Highest education level: Not on file  Occupational History   Occupation: retired Catering manager  Tobacco Use   Smoking status: Never   Smokeless tobacco: Never  Vaping Use   Vaping Use: Never used  Substance and Sexual Activity   Alcohol use: No   Drug use: No   Sexual activity: Not Currently    Partners: Male    Birth control/protection: Post-menopausal  Other Topics Concern   Not on file  Social History Narrative   Not on file   Social Determinants of Health   Financial Resource Strain: Not on file  Food Insecurity: Not on file  Transportation Needs: Not on file  Physical Activity: Not on file  Stress: Not on file  Social Connections: Not on file  Intimate Partner Violence: Not on file    FAMILY HISTORY: Family History  Problem Relation Age of Onset   Heart disease Mother    Diabetes Mother    Tuberculosis Paternal Grandfather    Bone cancer Paternal Grandfather    Breast cancer Sister 70   Melanoma Sister    Cirrhosis Maternal Grandfather    Heart failure Paternal Grandmother    Colon cancer Neg Hx    Ovarian cancer Neg Hx    Uterine cancer Neg Hx     ALLERGIES:  is allergic to bupropion, codeine, levofloxacin, penicillins, keflex [cephalexin], latex, and sulfa antibiotics.  MEDICATIONS:  Current Outpatient Medications  Medication Sig Dispense Refill   Rosuvastatin Calcium 20 MG CPSP 1 tablet     ARIPiprazole (ABILIFY) 2 MG tablet Take 2 mg by mouth daily.      aspirin 81 MG tablet Take 81 mg by mouth at bedtime.      [employer] FIBER GUMMIES PO Take 2 each by mouth daily.      Melatonin 5 MG TABS Take 20 mg by mouth at bedtime.      meloxicam (MOBIC) 15 MG tablet Take 1 tablet by mouth daily as needed.     pantoprazole (PROTONIX) 40 MG tablet Take 40 mg by mouth 2 (two) times daily.     PARoxetine (PAXIL) 40 MG tablet Take 40 mg by mouth daily.      Vitamin D, Ergocalciferol, (DRISDOL) 50000 UNITS CAPS Take 50,000 Units by  mouth every Monday.      No current facility-administered medications for this visit.   Facility-Administered Medications Ordered in Other Visits  Medication Dose Route Frequency Provider Last Rate Last Admin   0.9 %  sodium chloride infusion   Intravenous Continuous Boisseau, Hayley, PA        REVIEW OF SYSTEMS:   Constitutional: ( - ) fevers, ( - )  chills , ( - ) night sweats Eyes: ( - ) blurriness of vision, ( - ) double vision, ( - ) watery eyes Ears, nose, mouth, throat, and face: ( - ) mucositis, ( - ) sore throat Respiratory: ( - ) cough, ( - ) dyspnea, ( - ) wheezes Cardiovascular: ( - )  palpitation, ( - ) chest discomfort, ( - ) lower extremity swelling Gastrointestinal:  ( - ) nausea, ( - ) heartburn, ( - ) change in bowel habits Skin: ( - ) abnormal skin rashes Lymphatics: ( - ) new lymphadenopathy, ( - ) easy bruising Neurological: ( - ) numbness, ( - ) tingling, ( - ) new weaknesses Behavioral/Psych: ( - ) mood change, ( - ) new changes  All other systems were reviewed with the patient and are negative.  PHYSICAL EXAMINATION: GENERAL: well appearing elderly Caucasian female. alert, no distress and comfortable SKIN: scalp lesion reduced to a concave dent in scalp. skin color, texture, turgor are normal, no rashes or significant lesions EYES: conjunctiva are pink and non-injected, sclera clear LYMPH:  no palpable lymphadenopathy in the cervical, axillary or supraclavicular LUNGS: clear to auscultation and percussion with normal breathing effort HEART: regular rate & rhythm and no murmurs and no lower extremity edema Musculoskeletal: no cyanosis of digits and no clubbing  PSYCH: alert & oriented x 3, fluent speech NEURO: no focal motor/sensory deficits  LABORATORY DATA:  I have reviewed the data as listed CBC Latest Ref Rng & Units 11/22/2021 10/05/2021 08/19/2021  WBC 4.0 - 10.5 K/uL 3.3(L) 1.8(L) 2.8(L)  Hemoglobin 12.0 - 15.0 g/dL 14.7 14.6 13.8  Hematocrit 36.0 - 46.0  % 44.2 43.3 40.2  Platelets 150 - 400 K/uL 215 225 205    CMP Latest Ref Rng & Units 11/22/2021 10/05/2021 08/19/2021  Glucose 70 - 99 mg/dL 88 105(H) 76  BUN 8 - 23 mg/dL '15 14 11  '$ Creatinine 0.44 - 1.00 mg/dL 0.95 0.94 0.82  Sodium 135 - 145 mmol/L 143 142 145  Potassium 3.5 - 5.1 mmol/L 4.3 3.7 4.0  Chloride 98 - 111 mmol/L 108 106 111  CO2 22 - 32 mmol/L '30 28 26  '$ Calcium 8.9 - 10.3 mg/dL 9.3 9.3 8.7(L)  Total Protein 6.5 - 8.1 g/dL 6.5 6.6 6.3(L)  Total Bilirubin 0.3 - 1.2 mg/dL 0.4 0.5 0.4  Alkaline Phos 38 - 126 U/L 87 93 106  AST 15 - 41 U/L '25 20 20  '$ ALT 0 - 44 U/L '19 15 16    '$ No results found for: MPROTEIN No results found for: KPAFRELGTCHN, LAMBDASER, KAPLAMBRATIO   RADIOGRAPHIC STUDIES:  CT CHEST ABDOMEN PELVIS W CONTRAST  Result Date: 11/14/2021 CLINICAL DATA:  History of follicular lymphoma, monitor/surveillance EXAM: CT CHEST, ABDOMEN, AND PELVIS WITH CONTRAST TECHNIQUE: Multidetector CT imaging of the chest, abdomen and pelvis was performed following the standard protocol during bolus administration of intravenous contrast. RADIATION DOSE REDUCTION: This exam was performed according to the departmental dose-optimization program which includes automated exposure control, adjustment of the mA and/or kV according to patient size and/or use of iterative reconstruction technique. CONTRAST:  121m OMNIPAQUE IOHEXOL 300 MG/ML  SOLN COMPARISON:  Multiple priors including most recent CT May 10, 2021 FINDINGS: CT CHEST FINDINGS Cardiovascular: Interval removal of the right chest Port-A-Cath. Normal caliber thoracic aorta and central pulmonary arteries. No central pulmonary embolus on this nondedicated study. Normal size heart. No significant pericardial effusion/thickening. Mediastinum/Nodes: No supraclavicular adenopathy. No discrete thyroid nodule. No pathologically enlarged mediastinal, hilar or axillary lymph nodes. The trachea and esophagus are grossly unremarkable.  Lungs/Pleura: No suspicious pulmonary nodules or masses. No focal airspace consolidation. No pleural effusion. No pneumothorax. Musculoskeletal: Degenerative changes bilateral shoulders. Multifocal degenerative changes spine. Sclerotic focus in the T4 vertebral body is unchanged dating back to at least 2014 consistent with a benign entity. CT  ABDOMEN PELVIS FINDINGS Hepatobiliary: No suspicious hepatic lesion. Gallbladder is unremarkable. No biliary ductal dilation. Pancreas: No pancreatic ductal dilation or evidence of acute inflammation. Spleen: Normal size spleen without focal lesion. Adrenals/Urinary Tract: Bilateral adrenal glands appear normal. No hydronephrosis. Symmetric bilateral renal enhancement and excretion of contrast material. No solid enhancing renal mass. Urinary bladder is unremarkable for degree of distension. Stomach/Bowel: Radiopaque enteric contrast material traverses the splenic flexure. Stomach is minimally distended limiting evaluation. No pathologic dilation of small or large bowel. The appendix and terminal ileum appear normal. Moderate volume of formed stool throughout the colon suggestive of constipation. No evidence of acute bowel inflammation. Vascular/Lymphatic: Normal caliber abdominal aorta. No pathologically enlarged abdominal or pelvic lymph nodes. Reproductive: Uterus and bilateral adnexa are unremarkable. Other: No significant abdominopelvic free fluid. Musculoskeletal: Multilevel degenerative changes spine. No aggressive lytic or blastic lesion of bone. * onc * IMPRESSION: 1. No lymphadenopathy within the chest, abdomen, or pelvis. 2. No splenomegaly or evidence of osseous lymphomatous involvement. 3. Moderate volume of formed stool throughout the colon suggestive of constipation. Electronically Signed   By: Dahlia Bailiff M.D.   On: 11/14/2021 14:30    ASSESSMENT & PLAN Alyssa Elliott 71 y.o. female with medical history significant for diffuse non Hodgkin B cell  lymphoma who presents for a follow up visit.  After review of the imaging, review the labs, discussion with the patient her findings are most consistent with a diffuse non-Hodgkin B-cell lymphoma, follicular lymphoma.  The initial diagnosis based off the biopsy from the lesion on the head was that of a follicle cell lymphoma which tends to be a cutaneous lymphoma, however this diagnosis was considerably less likely given the diffuse spread throughout the lymph nodes of the chest and abdomen. Biopsy of the FDG avid lesion in the pelvis resulted with follicular lymphoma.   Previously we discussed the diagnosis of follicular lymphoma and the options moving forward.  She currently does not meet any GELF Criteria and therefore is not required to start therapy.  We discussed that treatment could be started if she so desired and we discussed the risks and benefits of bendamustine rituximab therapy.  She voiced her understanding of the different options between starting treatment and continued observation.  After discussion with her husband she noted she would like to start treatment given the continued growth of the lesion which was abutting the urinary bladder.   The regimen of choice for this patient is Rituximab monotherapy with Rituximab '375mg'$ /m2 IV weekly x 4 weeks, followed by IV rituximab q 8 weeks thereafter. This is being administered with curative intent. We transitioned to this regimen from bendamustine rituximab due to intolerance and hospitalization following her first cycle of treatment.  # Non-Hodgkin B Cell Lymphoma, Follicular Lymphoma. Stage III --pre treatment CT findings showed lymphadenopathy on both sides of the diaphragm. Consistent with at least a Stage III lymphoma -- confirmed the diagnosis with biopsy of the FDG avid mass near the uterus/cervix. Cancel the left axillary lymph node biopsy --started treatment with R-Benda, however the patient ended up hospitalized due to fever, weakness,  and marked drop in her labs.  She did not tolerate rituximab and bendamustine combination altogether and therefore we proceeded with rituximab monotherapy alone. She completed Rituximab weekly x 4 weeks.  Plan:  --CT imaging recommend q 6 months x 2 years.  Next CT scan due in 6 months time. -- Patient has completed maintenance rituximab --RTC in 6 months time   # Leukopenia --  Unclear etiology of leukopenia though does appear to be trending upward --Today's labs show white blood cell count 3.3, hemoglobin 14.7, MCV of 91.7, and platelets 215  #Fibrosing Mesenteritis (Mesenteric Panniculitis), resolved --improved with prednisone taper, complete resolution of symptoms.  --completed steroid taper.  --continue to monitor   #Supportive Care --port removed --zofran '8mg'$  q8H PRN and compazine '10mg'$  PO q6H for nausea -- EMLA cream for port -- no pain medication required at this time.   Orders Placed This Encounter  Procedures   CT CHEST ABDOMEN PELVIS W CONTRAST    Standing Status:   Future    Standing Expiration Date:   11/23/2022    Order Specific Question:   Preferred imaging location?    Answer:   [facility]    Order Specific Question:   Is Oral Contrast requested for this exam?    Answer:   Yes, Per Radiology protocol   All questions were answered. The patient knows to call the clinic with any problems, questions or concerns.  A total of more than 30 minutes were spent on this encounter and over half of that time was spent on counseling and coordination of care as outlined above.   Ledell Peoples, MD Department of Hematology/Oncology [place] at [facility] Phone: [phone] Pager: [phone] Email: [email or web address]  11/22/2021 1:05 PM

## 2021-11-23 ENCOUNTER — Telehealth: Payer: Self-pay | Admitting: Hematology and Oncology

## 2021-11-23 NOTE — Telephone Encounter (Signed)
Scheduled per 3/6 los, pt has been called and confirmed ?

## 2021-11-27 IMAGING — XA IR IMAGING GUIDED PORT INSERTION
1 series · 1 of 1 positions shown · non-contrast
Comparison: None.

INDICATION: 69-year-old female with history of lymphoma requiring central venous
access for chemotherapy.

EXAM:
IMPLANTED PORT A CATH PLACEMENT WITH ULTRASOUND AND FLUOROSCOPIC
GUIDANCE

[Series 300: ir imaging guided port insertion · 1 of 1 slices shown]
[im 1/1]
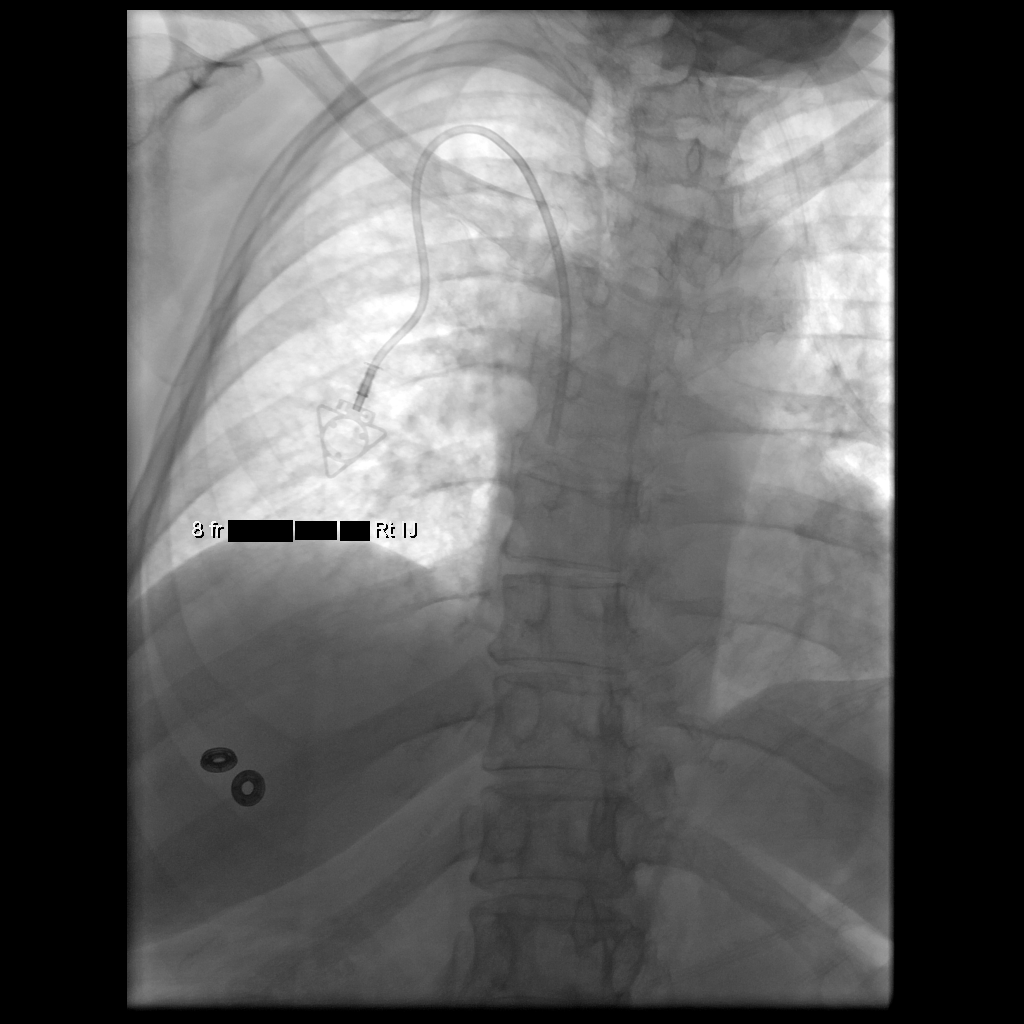

[1 of 1 positions shown; findings below may reference images not displayed]

MEDICATIONS:
Clindamycin 900 mg IV; The antibiotic was administered within an
appropriate time interval prior to skin puncture.

ANESTHESIA/SEDATION:
Moderate (conscious) sedation was employed during this procedure. A
total of Versed 4 mg and Fentanyl 100 mcg was administered
intravenously.

Moderate Sedation Time: 20 minutes. The patient's level of
consciousness and vital signs were monitored continuously by
radiology nursing throughout the procedure under my direct
supervision.

CONTRAST:  None

FLUOROSCOPY TIME:  0.1 minutes, (2 mGy)

COMPLICATIONS:
None immediate.

PROCEDURE:
The procedure, risks, benefits, and alternatives were explained to
the patient. Questions regarding the procedure were encouraged and
answered. The patient understands and consents to the procedure.

The right neck and chest were prepped with chlorhexidine in a
sterile fashion, and a sterile drape was applied covering the
operative field. Maximum barrier sterile technique with sterile
gowns and gloves were used for the procedure. A timeout was
performed prior to the initiation of the procedure.

Ultrasound was used to examine the jugular vein which was
compressible and free of internal echoes. A skin marker was used to
demarcate the planned venotomy and port pocket incision sites. Local
anesthesia was provided to these sites and the subcutaneous tunnel
track with 1% lidocaine with [DATE] epinephrine.

A small incision was created at the jugular access site and blunt
dissection was performed of the subcutaneous tissues. Under real
time ultrasound guidance, the jugular vein was accessed with a 21 ga
micropuncture needle and an 0.018" wire was inserted to the superior
vena cava. A 5 Fr micopuncture set was then used, through which a
0.035" Rosen wire was passed under fluoroscopic guidance into the
inferior vena cava. An 8 Fr dilator was then placed over the wire.

A subcutaneous port pocket was then created along the upper chest
wall utilizing a combination of sharp and blunt dissection. The
pocket was irrigated with sterile saline, packed with gauze, and
observed for hemorrhage. A single lumen "ISP" sized power injectable
port was chosen for placement. The 8 Fr catheter was tunneled from
the port pocket site to the venotomy incision. The port was placed
in the pocket. The external catheter was trimmed to appropriate
length. The dilator was exchanged for an 8 Fr peel-away sheath under
fluoroscopic guidance. The catheter was then placed through the
sheath and the sheath was removed. Final catheter positioning was
confirmed and documented with a fluoroscopic spot radiograph. The
port was accessed with Wolfgang Balgobin needle, aspirated, and flushed with
heparinized saline.

The deep dermal layer of the port pocket incision was closed with
interrupted 3-0 Vicryl suture. The skin was opposed with a running
subcuticular 4-0 Monocryl suture. Dermabond was then placed over the
port pocket and neck incisions. The patient tolerated the procedure
well without immediate post procedural complication.
FINDINGS: After catheter placement, the tip lies within the atrial junction
the catheter aspirates and flushes normally and is ready for
immediate use.
IMPRESSION: Successful placement of a power injectable Port-A-Cath via the right
internal jugular vein. The catheter is ready for immediate use.

## 2021-12-01 IMAGING — CR DG CHEST 2V
2 series · 2 of 2 positions shown · non-contrast
Comparison: Chest radiograph dated 09/02/2019.

CLINICAL DATA: 69-year-old female with suspected sepsis.

EXAM:
CHEST - 2 VIEW

[w chest pa]
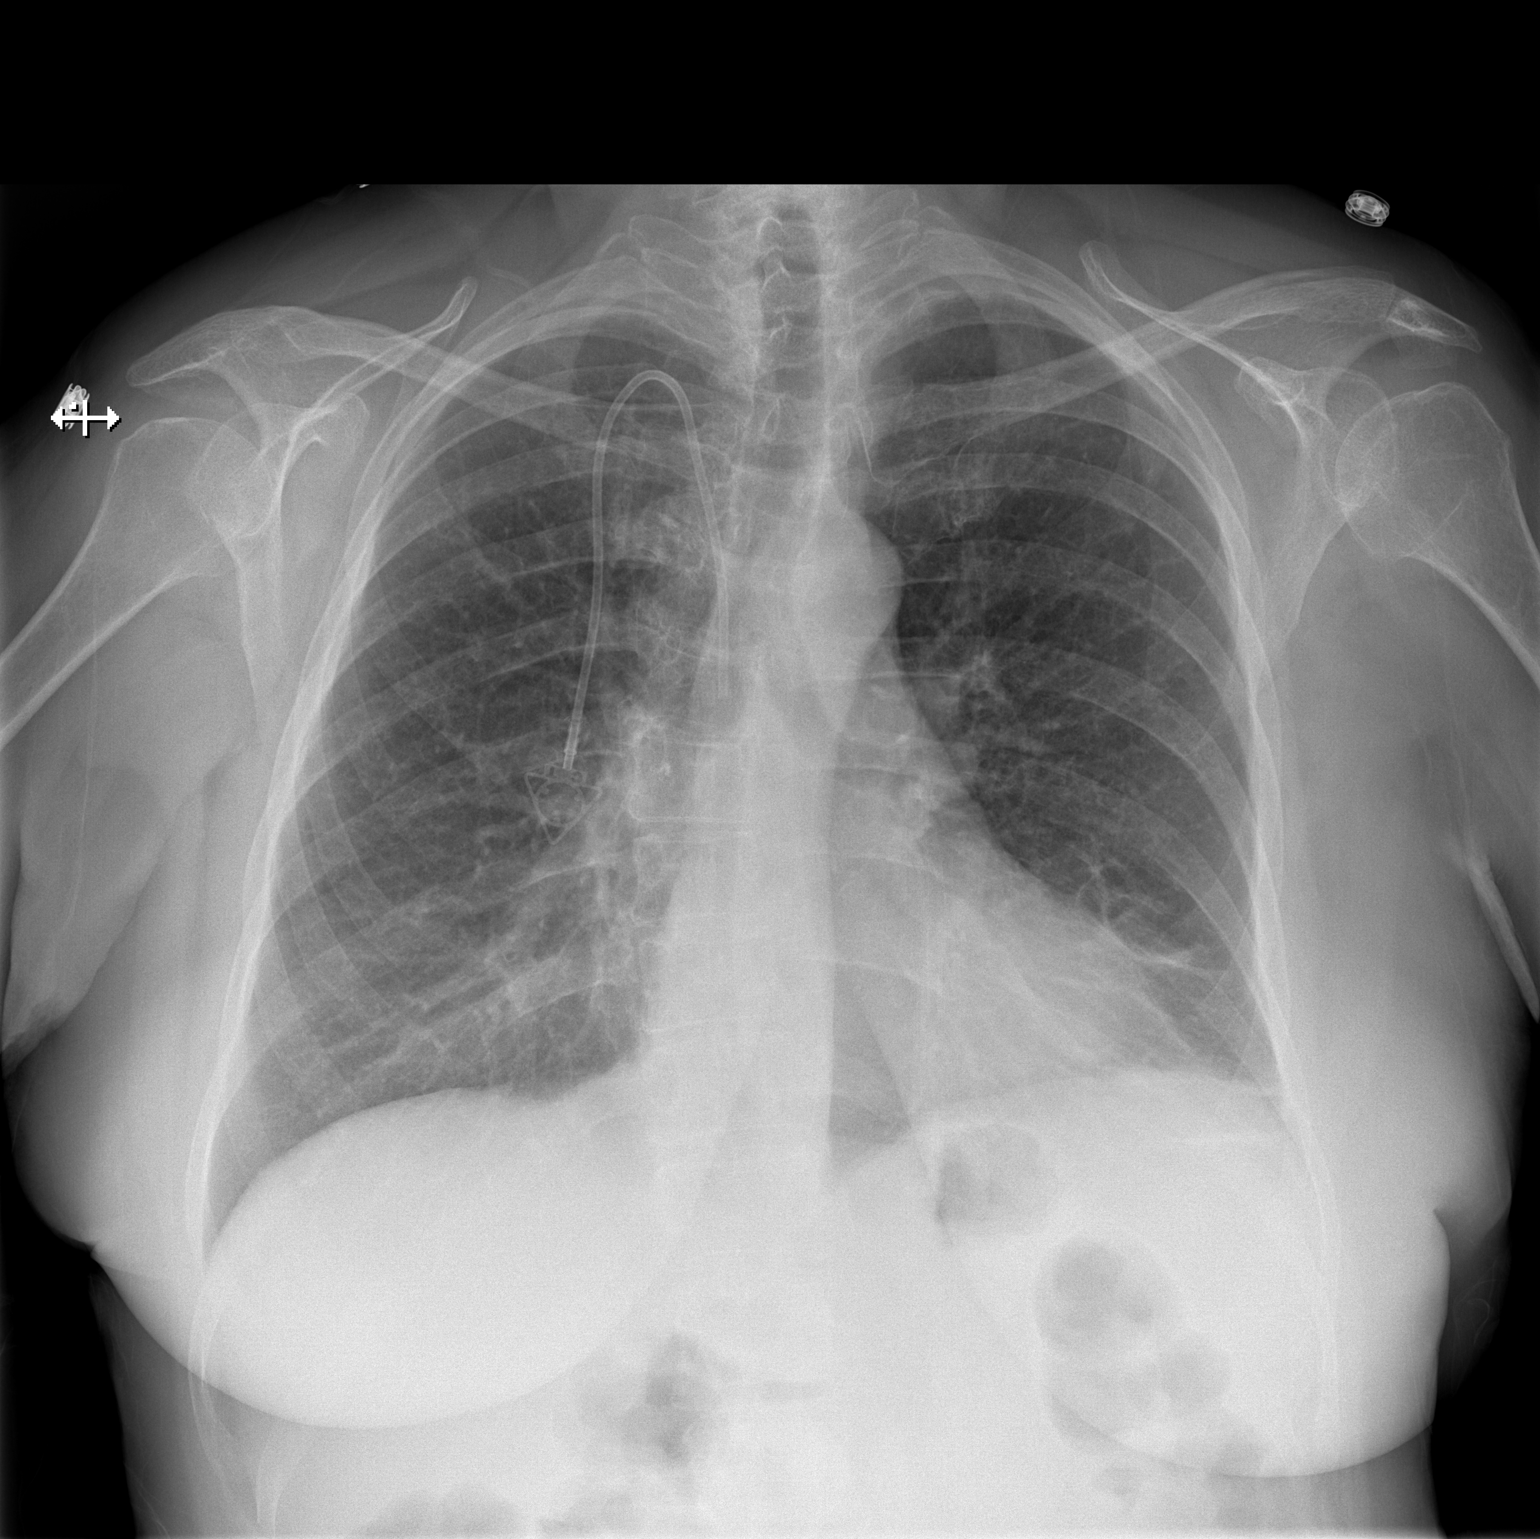

[w chest lat]
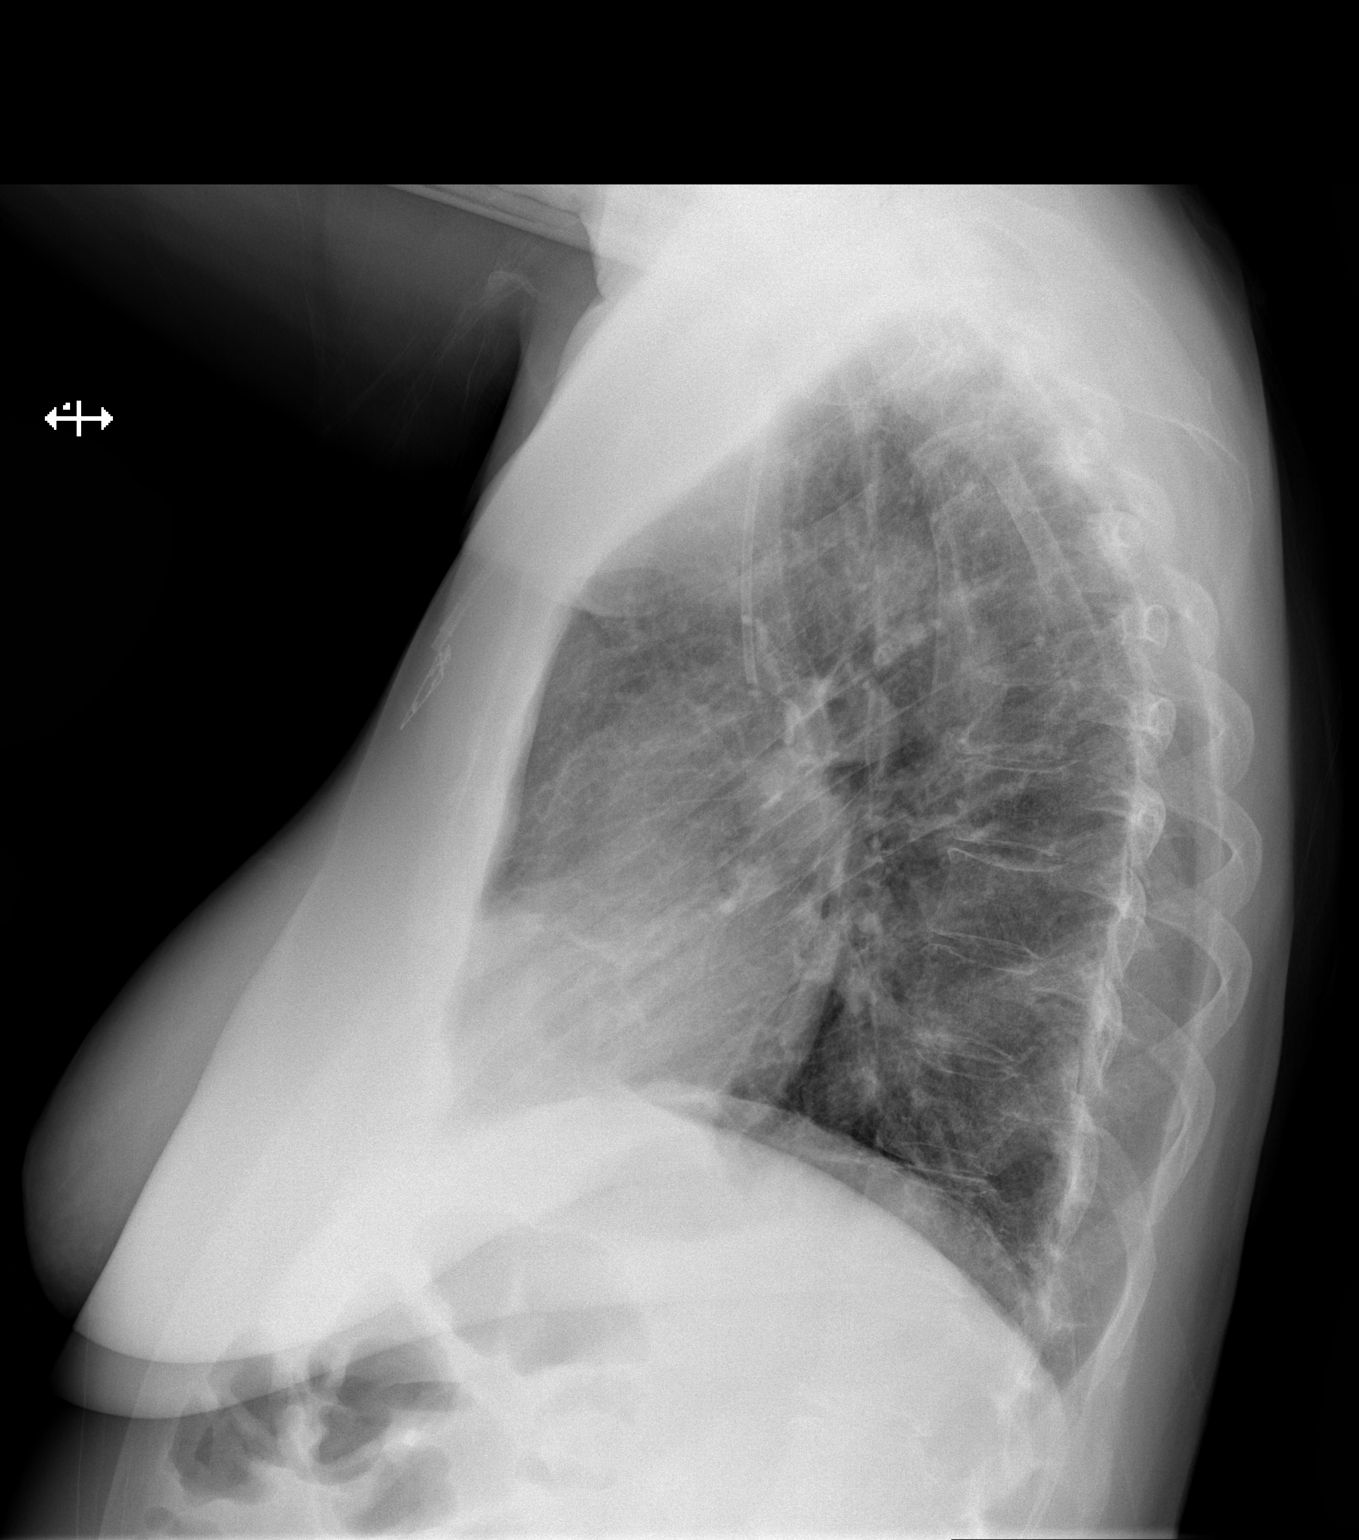

[2 of 2 positions shown; findings below may reference images not displayed]

FINDINGS: Right-sided Port-A-Cath with tip at the cavoatrial junction. There
are bibasilar linear atelectasis. No focal consolidation, pleural
effusion, or pneumothorax. There is background of mild chronic
bronchitic changes. The cardiac silhouette is within limits. No
acute osseous pathology.
IMPRESSION: No active cardiopulmonary disease.

## 2021-12-10 ENCOUNTER — Encounter (HOSPITAL_BASED_OUTPATIENT_CLINIC_OR_DEPARTMENT_OTHER): Payer: Self-pay

## 2021-12-10 ENCOUNTER — Other Ambulatory Visit: Payer: Self-pay

## 2021-12-10 ENCOUNTER — Emergency Department (HOSPITAL_BASED_OUTPATIENT_CLINIC_OR_DEPARTMENT_OTHER)
Admission: EM | Admit: 2021-12-10 | Discharge: 2021-12-11 | Disposition: A | Discharge status: Home / Self Care | Payer: Medicare Other | Attending: Emergency Medicine | Admitting: Emergency Medicine

## 2021-12-10 ENCOUNTER — Emergency Department (HOSPITAL_BASED_OUTPATIENT_CLINIC_OR_DEPARTMENT_OTHER): Payer: Medicare Other

## 2021-12-10 DIAGNOSIS — R748 Abnormal levels of other serum enzymes: Secondary | ICD-10-CM | POA: Insufficient documentation

## 2021-12-10 DIAGNOSIS — K5900 Constipation, unspecified: Secondary | ICD-10-CM

## 2021-12-10 DIAGNOSIS — R109 Unspecified abdominal pain: Secondary | ICD-10-CM | POA: Diagnosis not present

## 2021-12-10 DIAGNOSIS — K5641 Fecal impaction: Secondary | ICD-10-CM

## 2021-12-10 DIAGNOSIS — Z7982 Long term (current) use of aspirin: Secondary | ICD-10-CM | POA: Diagnosis not present

## 2021-12-10 DIAGNOSIS — Z91040 Latex allergy status: Secondary | ICD-10-CM | POA: Diagnosis not present

## 2021-12-10 LAB — URINALYSIS, ROUTINE W REFLEX MICROSCOPIC
Bilirubin Urine: NEGATIVE
Glucose, UA: NEGATIVE mg/dL
Hgb urine dipstick: NEGATIVE
Ketones, ur: NEGATIVE mg/dL
Leukocytes,Ua: NEGATIVE
Nitrite: NEGATIVE
Protein, ur: NEGATIVE mg/dL
Specific Gravity, Urine: 1.02 (ref 1.005–1.030)
pH: 7.5 (ref 5.0–8.0)

## 2021-12-10 LAB — CBC WITH DIFFERENTIAL/PLATELET
Abs Immature Granulocytes: 0.03 10*3/uL (ref 0.00–0.07)
Basophils Absolute: 0.1 10*3/uL (ref 0.0–0.1)
Basophils Relative: 1 %
Eosinophils Absolute: 0 10*3/uL (ref 0.0–0.5)
Eosinophils Relative: 0 %
HCT: 44.8 % (ref 36.0–46.0)
Hemoglobin: 15.2 g/dL — ABNORMAL HIGH (ref 12.0–15.0)
Immature Granulocytes: 0 %
Lymphocytes Relative: 6 %
Lymphs Abs: 0.4 10*3/uL — ABNORMAL LOW (ref 0.7–4.0)
MCH: 30.5 pg (ref 26.0–34.0)
MCHC: 33.9 g/dL (ref 30.0–36.0)
MCV: 90 fL (ref 80.0–100.0)
Monocytes Absolute: 0.9 10*3/uL (ref 0.1–1.0)
Monocytes Relative: 12 %
Neutro Abs: 6.1 10*3/uL (ref 1.7–7.7)
Neutrophils Relative %: 81 %
Platelets: 204 10*3/uL (ref 150–400)
RBC: 4.98 MIL/uL (ref 3.87–5.11)
RDW: 12.6 % (ref 11.5–15.5)
WBC: 7.5 10*3/uL (ref 4.0–10.5)
nRBC: 0 % (ref 0.0–0.2)

## 2021-12-10 LAB — COMPREHENSIVE METABOLIC PANEL
ALT: 21 U/L (ref 0–44)
AST: 34 U/L (ref 15–41)
Albumin: 4 g/dL (ref 3.5–5.0)
Alkaline Phosphatase: 83 U/L (ref 38–126)
Anion gap: 11 (ref 5–15)
BUN: 16 mg/dL (ref 8–23)
CO2: 24 mmol/L (ref 22–32)
Calcium: 8.6 mg/dL — ABNORMAL LOW (ref 8.9–10.3)
Chloride: 103 mmol/L (ref 98–111)
Creatinine, Ser: 0.79 mg/dL (ref 0.44–1.00)
GFR, Estimated: >60 mL/min (ref >60)
Glucose, Bld: 124 mg/dL — ABNORMAL HIGH (ref 70–99)
Potassium: 4 mmol/L (ref 3.5–5.1)
Sodium: 138 mmol/L (ref 135–145)
Total Bilirubin: 0.8 mg/dL (ref 0.3–1.2)
Total Protein: 6.7 g/dL (ref 6.5–8.1)

## 2021-12-10 LAB — LIPASE, BLOOD: Lipase: 131 U/L — ABNORMAL HIGH (ref 11–51)

## 2021-12-10 MED ORDER — SENNOSIDES-DOCUSATE SODIUM 8.6-50 MG PO TABS: 1.0000 | ORAL_TABLET | Freq: Every day | ORAL | 0 refills | Status: DC

## 2021-12-10 MED ORDER — FENTANYL CITRATE PF 50 MCG/ML IJ SOSY
50.0000 ug | PREFILLED_SYRINGE | Freq: Once | INTRAMUSCULAR | Status: AC
  Administered 2021-12-10: 50 ug via INTRAVENOUS
  Filled 2021-12-10: qty 1

## 2021-12-10 MED ORDER — IOHEXOL 300 MG/ML  SOLN
100.0000 mL | Freq: Once | INTRAMUSCULAR | Status: AC | PRN
  Administered 2021-12-10: 100 mL via INTRAVENOUS

## 2021-12-10 MED ORDER — POLYETHYLENE GLYCOL 3350 17 G PO PACK: 17.0000 g | PACK | Freq: Every day | ORAL | 0 refills | Status: DC

## 2021-12-10 MED ORDER — MINERAL OIL RE ENEM
1.0000 | ENEMA | Freq: Once | RECTAL | Status: AC
  Administered 2021-12-10: 1 via RECTAL
  Filled 2021-12-10: qty 1

## 2021-12-10 MED ORDER — ONDANSETRON HCL 4 MG/2ML IJ SOLN
4.0000 mg | Freq: Once | INTRAMUSCULAR | Status: AC
  Administered 2021-12-10: 4 mg via INTRAVENOUS
  Filled 2021-12-10: qty 2

## 2021-12-10 NOTE — ED Notes (Signed)
Bladder scanned for 440m, pt wishes to attempt to void to decompress bladder and avoid foley placement ?

## 2021-12-10 NOTE — Discharge Instructions (Addendum)
Tomorrow morning, recommend taking 1 dose of MiraLAX every hour until you start having good bowel movements up to a total of 4 or 5 doses.  Thereafter recommend taking MiraLAX daily.  I also recommend taking Senokot daily. ? ?Take the antibiotic as prescribed for possible early infection. ? ?If you develop worsening abdominal pain, any fever, vomiting, please return to ER for reassessment. ? ?Regarding the Foley catheter and urinary retention, please follow-up with urology for recheck, Foley catheter removal and voiding trial.  Call their clinic Monday morning to request close follow-up appointment. ? ?Also please follow-up with your primary care doctor this coming week. ?

## 2021-12-10 NOTE — ED Triage Notes (Signed)
Pt c/o "impaction" x today-rectal pain 10/10-reports hx of constipation with last BM last week-NAD-slow gait ?

## 2021-12-10 NOTE — ED Notes (Signed)
Patient transported to CT 

## 2021-12-10 NOTE — ED Notes (Signed)
ED Provider at bedside. 

## 2021-12-10 NOTE — ED Notes (Signed)
Only brown liquid return following mineral oil enema, no formed stool.  Pt slightly more comfortable. ?

## 2021-12-10 NOTE — ED Notes (Signed)
Pt unable to urinate at this time due to rectal discomfort ?

## 2021-12-10 NOTE — ED Provider Notes (Signed)
?[facility] ?Provider Note ? ? ?CSN: [id number] ?Arrival date & time: 12/10/21  1514 ? ?  ? ?History ? ?Chief Complaint  ?Patient presents with  ? Fecal Impaction  ? ? ?Alyssa Elliott is a 71 y.o. female.  Presented to ER with concern for constipation, lower abdominal pain.  Feels like she has an impaction, has had constipation for the past almost week.  States that the pain has only been going on for the last day or so.  Has tried enema at home without success, has tried other over-the-counter stool softeners without success.  Patient states that she has had prior issues with constipation but this feels worse than normal.  Also states that she has not urinated regularly in the past 24 hours.  Has been able to have a very small urine output. ? ?HPI ? ?  ? ?Home Medications ?Prior to Admission medications   ?Medication Sig Start Date End Date Taking? Authorizing Provider  ?polyethylene glycol (MIRALAX) 17 g packet Take 17 g by mouth daily. 12/10/21  Yes Lucrezia Starch, MD  ?senna-docusate (SENOKOT-S) 8.6-50 MG tablet Take 1 tablet by mouth daily. 12/10/21  Yes Lucrezia Starch, MD  ?ARIPiprazole (ABILIFY) 2 MG tablet Take 2 mg by mouth daily.  01/01/19   [provider]  ?aspirin 81 MG tablet Take 81 mg by mouth at bedtime.     [provider]  ?[employer] FIBER GUMMIES PO Take 2 each by mouth daily.     [provider]  ?Melatonin 5 MG TABS Take 20 mg by mouth at bedtime.     [provider]  ?meloxicam (MOBIC) 15 MG tablet Take 1 tablet by mouth daily as needed.    [provider]  ?pantoprazole (PROTONIX) 40 MG tablet Take 40 mg by mouth 2 (two) times daily. 02/05/19   [provider]  ?PARoxetine (PAXIL) 40 MG tablet Take 40 mg by mouth daily.  05/24/15   [provider]  ?Rosuvastatin Calcium 20 MG CPSP 1 tablet 10/27/21   [provider]  ?Vitamin D, Ergocalciferol, (DRISDOL) 50000 UNITS CAPS Take 50,000 Units by  mouth every Monday.  04/25/11   [provider]  ?   ? ?Allergies    ?Bupropion, Codeine, Levofloxacin, Penicillins, Keflex [cephalexin], Latex, and Sulfa antibiotics   ? ?Review of Systems   ?Review of Systems  ?Constitutional:  Negative for chills and fever.  ?HENT:  Negative for ear pain and sore throat.   ?Eyes:  Negative for pain and visual disturbance.  ?Respiratory:  Negative for cough and shortness of breath.   ?Cardiovascular:  Negative for chest pain and palpitations.  ?Gastrointestinal:  Positive for abdominal pain and constipation. Negative for vomiting.  ?Genitourinary:  Positive for difficulty urinating. Negative for dysuria and hematuria.  ?Musculoskeletal:  Negative for arthralgias and back pain.  ?Skin:  Negative for color change and rash.  ?Neurological:  Negative for seizures and syncope.  ?All other systems reviewed and are negative. ? ?Physical Exam ?Updated Vital Signs ?BP 122/72   Pulse 90   Temp 98.2 ?F (36.8 ?C) (Oral)   Resp 16   LMP 09/04/2002 (Exact Date)   SpO2 98%  ?Physical Exam ?Vitals and nursing note reviewed.  ?Constitutional:   ?   General: She is not in acute distress. ?   Appearance: She is well-developed.  ?HENT:  ?   Head: Normocephalic and atraumatic.  ?Eyes:  ?   Conjunctiva/sclera: Conjunctivae normal.  ?Cardiovascular:  ?  Rate and Rhythm: Normal rate and regular rhythm.  ?   Heart sounds: No murmur heard. ?Pulmonary:  ?   Effort: Pulmonary effort is normal. No respiratory distress.  ?   Breath sounds: Normal breath sounds.  ?Abdominal:  ?   Palpations: Abdomen is soft.  ?   Tenderness: There is abdominal tenderness. There is no guarding or rebound.  ?   Comments: Tenderness to palpation noted in left lower quadrant  ?Musculoskeletal:     ?   General: No swelling.  ?   Cervical back: Neck supple.  ?Skin: ?   General: Skin is warm and dry.  ?   Capillary Refill: Capillary refill takes less than 2 seconds.  ?Neurological:  ?   Mental Status: She is alert.   ?Psychiatric:     ?   Mood and Affect: Mood normal.  ? ? ?ED Results / Procedures / Treatments   ?Labs ?(all labs ordered are listed, but only abnormal results are displayed) ?Labs Reviewed  ?CBC WITH DIFFERENTIAL/PLATELET - Abnormal; Notable for the following components:  ?    Result Value  ? Hemoglobin 15.2 (*)   ? Lymphs Abs 0.4 (*)   ? All other components within normal limits  ?COMPREHENSIVE METABOLIC PANEL - Abnormal; Notable for the following components:  ? Glucose, Bld 124 (*)   ? Calcium 8.6 (*)   ? All other components within normal limits  ?LIPASE, BLOOD - Abnormal; Notable for the following components:  ? Lipase 131 (*)   ? All other components within normal limits  ?URINALYSIS, ROUTINE W REFLEX MICROSCOPIC  ? ? ?EKG ?None ? ?Radiology ?CT ABDOMEN PELVIS W CONTRAST ? ?Result Date: 12/10/2021 ?CLINICAL DATA:  Abdominal pain, constipation EXAM: CT ABDOMEN AND PELVIS WITH CONTRAST TECHNIQUE: Multidetector CT imaging of the abdomen and pelvis was performed using the standard protocol following bolus administration of intravenous contrast. RADIATION DOSE REDUCTION: This exam was performed according to the departmental dose-optimization program which includes automated exposure control, adjustment of the mA and/or kV according to patient size and/or use of iterative reconstruction technique. CONTRAST:  156m OMNIPAQUE IOHEXOL 300 MG/ML  SOLN COMPARISON:  11/12/2021 FINDINGS: Lower chest: Unremarkable. Hepatobiliary: There is fatty infiltration in the liver. Gallbladder is unremarkable. Pancreas: No focal abnormality is seen. Spleen: Unremarkable. Adrenals/Urinary Tract: Adrenals are unremarkable. There is no hydronephrosis. There are no renal or ureteral stones. Urinary bladder is unremarkable. Stomach/Bowel: Small hiatal hernia is seen. Stomach is not distended. There is no significant dilation of small-bowel loops. Appendix is unremarkable. There is fluid in the lumen of ascending, transverse and proximal  descending colon. Moderate amount of stool is seen in the rectosigmoid. There is mild wall thickening in the rectosigmoid. There is mild stranding in the fat planes adjacent to the rectum. Vascular/Lymphatic: Unremarkable. Reproductive: Unremarkable. Other: There is no or pneumoperitoneum. There is small umbilical hernia containing fat. Musculoskeletal: Degenerative changes are noted in the lumbar spine, particularly severe at L4-L5 and L5-S1 levels with spinal stenosis and encroachment of neural foramina. IMPRESSION: There is no evidence of intestinal obstruction or pneumoperitoneum. There is no hydronephrosis. Appendix is not dilated. There is fluid in the lumen of colon. There is mild diffuse wall thickening in colon. Moderate amount of stool is seen in the lumen of rectosigmoid. There is mild stranding in the fat planes adjacent to the rectum. Findings suggest possible stercoral colitis. Please correlate with clinical and laboratory findings. Small hiatal hernia.  Fatty liver.  Lumbar spondylosis. Other findings as described in  the body of the report. Electronically Signed   By: Elmer Picker M.D.   On: 12/10/2021 18:57   ? ?Procedures ?Fecal disimpaction ? ?Date/Time: 12/11/2021 3:15 PM ?Performed by: Lucrezia Starch, MD ?Authorized by: Lucrezia Starch, MD  ?Consent: Verbal consent obtained. ?Consent given by: patient ?Imaging studies: imaging studies available ?Patient identity confirmed: verbally with patient ?Preparation: Patient was prepped and draped in the usual sterile fashion. ?Local anesthesia used: no ? ?Anesthesia: ?Local anesthesia used: no ? ?Sedation: ?Patient sedated: no ? ?Patient tolerance: patient tolerated the procedure well with no immediate complications ? ?  ? ? ?Medications Ordered in ED ?Medications  ?ondansetron (ZOFRAN) injection 4 mg (4 mg Intravenous Given 12/10/21 1824)  ?fentaNYL (SUBLIMAZE) injection 50 mcg (50 mcg Intravenous Given 12/10/21 1827)  ?iohexol (OMNIPAQUE)  300 MG/ML solution 100 mL (100 mLs Intravenous Contrast Given 12/10/21 1847)  ?mineral oil enema 1 enema (1 enema Rectal Given 12/10/21 2005)  ? ? ?ED Course/ Medical Decision Making/ A&P ?  ?                        ?M

## 2021-12-10 NOTE — ED Notes (Signed)
Pt desat to 80% following administration of  fentanyl, pt took a few deep breaths, recovered to 95% rm air.  RT at bedside, applied oxygen 2L, improved to 100% ?

## 2021-12-18 ENCOUNTER — Encounter

## 2021-12-20 MED ORDER — SPIRONOLACTONE 50 MG TAB
50 mg | ORAL_TABLET | Freq: Every day | ORAL | 0 refills | Status: AC
Start: 2021-12-20 — End: ?

## 2021-12-30 ENCOUNTER — Ambulatory Visit: Payer: Medicare Other | Admitting: Nurse Practitioner

## 2021-12-31 ENCOUNTER — Ambulatory Visit
Admission: EM | Admit: 2021-12-31 | Discharge: 2021-12-31 | Disposition: A | Discharge status: Home / Self Care | Payer: Medicare Other

## 2021-12-31 DIAGNOSIS — M79672 Pain in left foot: Secondary | ICD-10-CM | POA: Diagnosis not present

## 2021-12-31 NOTE — ED Triage Notes (Signed)
Pt states she has a fall about 9 days ago. She states she fell on the left food, there is bruising to the area. ?

## 2021-12-31 NOTE — ED Provider Notes (Signed)
Patient reports falling 9 days ago and falling awkwardly on her left foot.  Patient states she is able to walk but not without pain, the pain is on the lateral aspect of her ankle and radiates up her leg.  Patient states continues to be a significant amount of bruising that she feels is getting worse on the outside of her left foot and ankle. ? ?Patient advised that we do not have radiology technician here at this location today.  Patient also advised that because of the significant bruising which she is concerned has gotten worse instead of better, I believe that she should have an x-ray is soon as possible and that the most efficient way to get the care that she needs on a Friday afternoon at 3 PM is for her to go to the [facility] urgent [facility].  Patient was provided with her contact information and address.  Patient was agreeable to this plan.  No services were provided during this visit today. ?  ?Lynden Oxford Scales, PA-C ?12/31/21 1513 ? ?

## 2022-01-03 NOTE — Progress Notes (Signed)
Formatting of this note is different from the original.  Images from the original note were not included.    Heart Failure Clinic - Follow Up Visit    Name: Lindsey Huynh Patient ID: [id number]  Age: 71 y.o. 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 NYHA functional Class II  - Checking BMP/mag/proBNP with recent switch to Entresto  - Continue Tier 2 Entresto, coreg 6.25 mg BID, Jardiance, and spironolactone  - Ordered repeat TTE to evaluate cardiac function    S/p PEA arrest    HTN,  controlled  - See above    DMII  - Followed by PCP    PVD, s/p toe amputation     RTC in 3 months with Dr Veverly Fells as previously arranged for interval evaluation; shared management with HF NP clinic    No orders of the defined types were placed in this encounter.    Recent Results (from the past 32 hour(s))   Basic Metabolic Panel    Collection Time: 01/03/22  9:26 AM   Result Value Ref Range    Sodium 136 135 - 145 mmol/L    Potassium 5.1 3.6 - 5.1 mmol/L    Potassium Comment Not Hemolyzed Not Hemolyzed    Chloride 105 100 - 110 mmol/L    Carbon Dioxide 25 21 - 33 mmol/L    Anion Gap 6 0 - 12 mmol/L    Glucose 236 (H) 65 - 100 mg/dL    BUN 44 (H) 8 - 23 mg/dL    Creatinine 1.26 (H) 0.50 - 1.00 mg/dL    GFRcr (Estimated) 45 (L) >=60 mL/min/1.49m    Calcium 10.2 8.9 - 10.7 mg/dL   Magnesium    Collection Time: 01/03/22  9:26 AM   Result Value Ref Range    Magnesium 2.1 1.8 - 2.8 mg/dL   N-Terminal ProBNP    Collection Time: 01/03/22  9:26 AM   Result Value Ref Range    NT ProBNP 304 (H) 0 - 124 pg/mL     proBNP improved    K upper limit normal    Cr stable    ADDENDUM 04/14/22:    TTE  results reviewed:    EF remains at 40%.   Mild MR    Interpretation Summary  Quality adequate  1. Concentric LV remodeling with mildly depressed LV systolic function.  Estimated LVEF 40 to 45%. 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 Diastolic dysfunction with normal left atrial pressures  3. Normal RV size and systolic function  4. Biatrial sizes normal  5. Mild mitral regurgitation. Sclerotic cardiac valves  6. Normal size aortic root  7. Normal IVC size and estimated right atrial pressure    LEFT VENTRICLE  Normal left ventricular cavity size. There is concentric left ventricular  remodeling. LV ejection fraction = 40-45%. There is mild left ventricular  diastolic dysfunction with normal left atrial pressure. Akinesis of the  inferolateral wall and hypokinesis inferior wall with a small aneurysmal area  in the basal inferior wall.    RIGHT VENTRICLE  The right ventricle is normal size. The right ventricular systolic function is  normal.    LEFT ATRIUM  The left atrial size is normal.    RIGHT ATRIUM  Right atrial size is normal.    AORTIC VALVE  The aortic valve is trileaflet. There is mild aortic valve thickening. There  is no aortic stenosis. There is no aortic regurgitation.    MITRAL VALVE  There are mildly thickened mitral valve leaflets. There is mild mitral  regurgitation.    TRICUSPID VALVE  Structurally normal tricuspid valve. There is trivial tricuspid regurgitation.  There is insufficient tricuspid regurgitation to estimate right ventricular  systolic pressure.    PULMONIC VALVE  Structurally normal pulmonic valve. Trace pulmonic valvular regurgitation.    ARTERIES  The aortic root is normal size. Maximal visible dimension is 3.2 cm. The  proximal ascending aorta appears normal. The transverse aorta is not well  visualized. The proximal descending aorta is not well visualized. The  pulmonary artery is not  well visualized.    VENOUS  The inferior vena cava is normal in size.    EFFUSION  Insignificant pericardial effusion or subepicardial fat.  +-----------------------------------------------------------------------------+  :Normal Values                                                                :  :IVSd: 0.7cm - 1.2cm  LVIDd: 3.5cm - 5.5cm        LVIDs: 2.5cm - 4.0cm        :  :LVPWd: 0.7cm - 1.1cm LA: 1.9cm - 3.8cm           Ao: 2.0cm - 3.7cm           :  :EF: (55 - 75%)       LA Area: <>                 RA Area: <>                 :  :RVd: 4.3cm           LV Mass(Men): <>            LV Mass(Women): <>          :  :                     LV Mass Index(Men): <116g>  LV Mass Index(Women): <96g> :  +-----------------------------------------------------------------------------+  MMode/2D Measurements & Calculations  IVSd: 0.98 cm        LVIDd: 4.2 cm          LV mass(C)d: 145.7 grams                       LVIDs: 3.8 cm          LV mass(C)dI: 78.8 grams/m2                       LVPWd: 1.1 cm    ______________________________________________________________________________  Ao root diam: 3.3 cm asc Aorta Diam: 3.6 cm LA/Ao: 0.96  LA dimension: 3.1 cm  LVOT diam: 2.1 cm    ______________________________________________________________________________  EDV(MOD-sp4):        LVLd ap2: 7.9 cm       TAPSE: 1.2 cm  106.0 ml             EDV(MOD-sp2): 112.0 ml  LVLs ap4: 7.0 cm     LVLs ap2: 7.4 cm  ESV(MOD-sp4): 68.0 mlESV(MOD-sp2): 59.1 ml  EF(MOD-sp4): 35.8 %  EF(MOD-sp2): 47.2 %    ______________________________________________________________________________  MR Jet Area_: 4.0 cm2RA ESA: 11.6 cm2       RV Base: 3.1 cm    ______________________________________________________________________________  LA A4Cs: 17.4 cm2    LA ESV (MOD-BP):       LA volume MOD BP Indexed:                       45.6 ml                24.6 ml/m2    Time Measurements  Aortic R-R: 1.1 sec  Aortic HR: 57.0 BPM    Doppler  Measurements & Calculations  MV E max vel: 55.8 cm/sec MV dec slope: 135.4 cm/sec2  Ao V2 max: 92.3 cm/sec  MV A max vel: 77.1 cm/sec MV dec time: 0.41 sec        Ao max PG: 3.0 mmHg  MV E/A: 0.72                                           AVA(V,D): 2.9 cm2      ______________________________________________________________________________  LV V1 max PG: 2.0 mmHg    CO(LVOT): 3.5 l/min          AV VR: 0.85  LV V1 mean PG: 1.3 mmHg   SV(LVOT): 61.4 ml  LV V1 max: 78.8 cm/sec  LV V1 mean: 52.9 cm/sec  LV V1 VTI: 18.2 cm    ______________________________________________________________________________  MV P1/2t-pr_: 120.7 msec  RV S Vel: 7.3 cm/sec         MV LAT E': 7.1 cm/sec    ______________________________________________________________________________  MV LAT E/E': 7.9          MV MED E': 4.1 cm/sec        MV MED E/E': 13.5    _______________________________________________________  HISTORY   Lindsey Huynh is a 71 y.o. female with a history of PEA arrest, CAD (s/p CABG 12/2020), HF with mid-range EF (EF 40%)- ICM, HTN, DMII. Also with HLD, PVD, and CVA.    INTERVAL HISTORY   HPI     She has been following with HF NP clinic since hospitalization in February.    Referred for LHC that revealed severe multivessel disease, so ultimately underwent 2V CABG in April 2022 (LIMA to LAD, R SVG to OM). TTE with EF 45%.     She phoned in a couple of weeks ago with change in insurance, so ARB changed to [place].    Presents to clinic today for evaluation of cardiomyopathy. Reports she's feeling improved, fatigue and exertional dyspnea improved. No LE edema.    Review of Systems   Constitutional: Negative for activity change and appetite change.   Respiratory: Negative for apnea and shortness of breath.    Cardiovascular: Positive for chest pain (post op sternal pain). Negative for palpitations and leg swelling.   Gastrointestinal: Negative for abdominal distention.   Neurological: Negative for dizziness, syncope, weakness  and light-headedness.       Allergies:   Neuromuscular blocking agents    MEDICATIONS   Current Outpatient Medications on File Prior to Visit   Medication Sig Dispense Refill   ? acetaminophen (Tylenol) 325 MG tablet Take 650 mg by mouth every 8 hours as needed for mild pain.      ? [EXPIRED] aspirin 81 MG EC tablet Take 2 tablets by mouth daily. DO NOT stop taking unless instructed by provider 60 tablet 11   ? atorvastatin (Lipitor) 80 MG tablet Take 1 tablet by mouth daily. 90 tablet 3   ? carvedilol (Coreg) 6.25 MG tablet Take 1 tablet by mouth 2 times daily with meals. 180 tablet 3   ? clopidogrel (Plavix) 75 MG tablet Take 1 tablet by mouth daily. 30 tablet 11   ? empagliflozin (Jardiance) 10 MG Take 1 tablet by mouth daily. 30 tablet 11   ? furosemide (Lasix) 20 mg tablet Take 1 tablet by mouth as needed (weight gain 3-5 lbs). 30 tablet 11   ? gabapentin (Neurontin) 100 mg capsule Take 1 capsule by mouth 3 times a day. (Patient taking differently: Take 100 mg by mouth 3 times a day as needed.) 90 capsule 0   ? Levemir FlexTouch 100 UNIT/ML injection pen Inject 12 Units under the skin daily.     ? magnesium oxide (Mag-Ox) 400 MG tablet Take 2 tablets by mouth daily. 90 tablet 3   ? metFORMIN XR (Glucophage-XR) 500 mg 24 hr tablet Take 1,000 mg by mouth daily with evening meal. Do not crush, chew, or split.     ? methocarbamol (Robaxin) 500 MG tablet Take 1 tablet by mouth every 8 hours as needed for muscle spasms for up to 10 days. 30 tablet 0   ? oxyCODONE (Roxicodone) 5 MG immediate release tablet Take 5 mg by mouth daily as needed for moderate pain.     ? pantoprazole (Protonix) 20 MG EC tablet Take 1 tablet by mouth daily. 90 tablet 3   ? sacubitril-valsartan (Entresto) 49-51 mg tablet Take 1 tablet by mouth 2 times daily. 60 tablet 11   ? [EXPIRED] senna (Senokot) 8.6 MG tablet Take 2 tablets by mouth at bedtime. (Patient not taking: Reported on 04/27/2021) 60 tablet 11   ? spironolactone (Aldactone) 25 MG  tablet Take 25 mg by mouth daily.       No current facility-administered medications on file prior to visit.     Past Medical History / Problem List     Diagnosis Date   ? Essential hypertension 10/29/2020   ? Hypertension    ? Type 2 diabetes mellitus, with long-term current use of insulin (CMS/HCC)      Surgical History   Past Surgical History:   Procedure Laterality Date   ? TOE AMPUTATION Left      Social History   She reports that she has never smoked. She has never used smokeless tobacco. She reports current alcohol use. She reports that she does not currently use drugs.    Family History   Family history is unknown by patient.    Visit Vitals  BP 111/72 (BP Location: Right arm, Patient Position: Sitting, BP Cuff Size: Small adult)   Pulse 62   Temp 36.6 C (97.9 F) (Oral)   Resp 16   Ht 1.626 m   Wt 79.4 kg   SpO2 99%   BMI 30.02 kg/m   OB Status Hysterectomy   Smoking Status Never   BSA  1.89 m     Physical Exam  Eyes:      Extraocular Movements: Extraocular movements intact.   Neck:      Vascular: No JVD.   Cardiovascular:      Rate and Rhythm: Normal rate and regular rhythm.      Pulses: Normal pulses.      Heart sounds: Normal heart sounds. No murmur heard.  Pulmonary:      Effort: Pulmonary effort is normal.      Breath sounds: Normal breath sounds.   Abdominal:      General: Bowel sounds are normal. There is no distension.      Palpations: Abdomen is soft.   Musculoskeletal:      Right lower leg: No edema.      Left lower leg: No edema.   Skin:     General: Skin is warm and dry.   Neurological:      General: No focal deficit present.      Mental Status: She is alert and oriented to person, place, and time.       IMAGING AND OTHER STUDIES     Echo/Echo Stress:  Enc. date: 12/25/20    TRANSTHORACIC ECHO (TTE) COMPLETE    Interpretation Summary  Technically poor quality study.  1. Concentric LVH with mildly reduced systolic function. LVEF 40 to 45%.  Despite use of thickened enhancing agent it is  difficult to exclude regional  wall motion abnormality. Wall motion seems to be best preserved in the apex.  2. Severely reduced RV systolic function.  3. Mildly thickened and calcified aortic valve without significant stenosis.  4. Dilated IVC.  5. Compared to prior study on 10/27/2020 and Intra-Op TEE, there has been  interval decrease in RV systolic function.    LEFT VENTRICLE  Normal left ventricular cavity size. There is concentric left ventricular  hypertrophy. Left ventricular systolic function is mildly reduced. LV ejection  fraction = 40-45%. There is mild left ventricular diastolic dysfunction with  normal left atrial pressure. Despite use of echo enhancing agent, regional  wall motion abnormality cannot be excluded. Wall motion seems to be best  preserved in the apex.    RIGHT VENTRICLE  The right ventricle is normal size. The right ventricular systolic function is  severely reduced.    LEFT ATRIUM  The left atrial size is normal.    RIGHT ATRIUM  Right atrial size is normal. There is a catheter seen in the right atrium.    AORTIC VALVE  The aortic valve is not well visualized. Mildly thickened and calcified aortic  valve. There is no aortic stenosis. There is no aortic regurgitation.    MITRAL VALVE  There is mild mitral valve thickening. There is trivial mitral regurgitation.    TRICUSPID VALVE  The tricuspid valve is not well visualized. There is trivial tricuspid  regurgitation. There is insufficient tricuspid regurgitation to estimate right  ventricular systolic pressure.    PULMONIC VALVE  The pulmonic valve is not well visualized.    ARTERIES  The aortic root is not well visualized. The proximal ascending aorta is not  well visualized. The transverse aorta is not well visualized. The proximal  descending aorta is not well visualized. The pulmonary artery is not well  visualized.    VENOUS  The inferior vena cava is dilated.    EFFUSION  Insignificant pericardial effusion or subepicardial  fat.    MMode/2D Measurements & Calculations  RVDd: 3.4 cm  LVIDd: 4.5 cm                  LV mass(C)d: 187.3 grams  IVSd: 1.3 cm          LVIDs: 3.6 cm                  LV mass(C)dI:  LVPWd: 0.99 cm                 99.4 grams/m2    Cath:  Enc. date: 11/26/20    CARDIAC CATHETERIZATION    * Cardiac arrest (CMS/HCC) [I46.9]  * Acute HFrEF (heart failure with reduced ejection fraction) (CMS/HCC) [I50.21]  Abnormal stress test   1. Right dominant circulation  2. Severe  coronary disease involving the proximal LAD, LCX (CTO), RCA, and RPLB (CTO)  3. Normal left heart filling pressure  4. Elevated systemic arterial pressure    Left Heart Catheterization  Left Ventricular Pressure (Systolic/EDP) 824/2 mmHg  Aortic Pressure 190/80 mmHg    Coronary Angiography  Left Main Coronary Artery Large caliber vessel with luminal irregularities.  Ramus Intermedius (RI) Moderate caliber vessel with luminal irregularities.  Left Anterior Descending (LAD) Large caliber vessel that wraps the apex and has 70% proximal stenosis.  First Diagonal (D1) Small caliber vessel with luminal irregularities.  Second Diagonal (D2) Very small caliber vessel.  Left Circumflex (LCX) Large caliber vessel with a CTO after the takeoff of OM1. Distal territory fills by left-to-left collaterals.  First Obtuse Marginal (OM1) Moderate caliber vessel with luminal irregularities.  Second Obtuse Marginal (OM2) Fills retrograde by left-to-left collaterals. Small-moderate caliber vessel diffuse luminal irregularities.  Right Coronary (RCA) Large caliber dominant vessel with 70% proximal and 80% mid segment stenoses.  Posterior Descending (PDA) Moderate caliber vessel with an early takeoff from the RCA and luminal irregularities.  Posterolateral Branch (PLB) Moderate caliber vessel with a CTO in the proximal segment. Fills by right-to-right collaterals.  Dominance Right.    Impression and Recommendations  1. Right dominant circulation  2. Severe  coronary  disease involving the proximal LAD, LCX (CTO), RCA, and RPLB (CTO)  3. Normal left heart filling pressure  4. Elevated systemic arterial pressure    Recommend CT surgery evaluation for CABG and optimization of anti-hypertensive management.    MRI:  No results found for this or any previous visit.    Nuclear Medicine:  No results found for this or any previous visit.    Stress Testing:  No results found for this or any previous visit.      Electronically signed by Suzi Roots, NP at 04/14/2022 10:57 AM EDT

## 2022-01-06 ENCOUNTER — Ambulatory Visit: Payer: Medicare Other | Admitting: Physician Assistant

## 2022-01-10 ENCOUNTER — Encounter: Payer: Self-pay | Admitting: Physician Assistant

## 2022-01-10 ENCOUNTER — Ambulatory Visit (INDEPENDENT_AMBULATORY_CARE_PROVIDER_SITE_OTHER): Payer: Medicare Other | Admitting: Physician Assistant

## 2022-01-10 ENCOUNTER — Ambulatory Visit (INDEPENDENT_AMBULATORY_CARE_PROVIDER_SITE_OTHER): Payer: Medicare Other

## 2022-01-10 DIAGNOSIS — M25572 Pain in left ankle and joints of left foot: Secondary | ICD-10-CM | POA: Diagnosis not present

## 2022-01-10 NOTE — Progress Notes (Signed)
? ?Office Visit Note ?  ?Patient: Alyssa Elliott           ?Date of Birth: [birth year]           ?MRN: [id number] ?Visit Date: 01/10/2022 ?             ?Requested by: Prince Solian, MD ?[address] ?[place],  [place] [address] ?PCP: Prince Solian, MD ? ? ?Assessment & Plan: ?Visit Diagnoses:  ?1. Pain in left ankle and joints of left foot   ? ? ?Plan: She will work on range of motion of the left ankle.  She is placed the ASO brace today she will wear this at all times whenever she is up ambulating for the next 3 weeks and then go into the brace only when she is out of the home for an additional 2 weeks.  We will see her back in 1 month to see how she is doing overall.  Questions were encouraged and answered at length today.  No radiographs at next office visit unless clinically indicated. ? ?Follow-Up Instructions: Return in about 4 weeks (around 02/07/2022).  ? ?Orders:  ?Orders Placed This Encounter  ?Procedures  ? XR Foot Complete Left  ? XR Ankle Complete Left  ? ?No orders of the defined types were placed in this encounter. ? ? ? ? Procedures: ?No procedures performed ? ? ?Clinical Data: ?No additional findings. ? ? ?Subjective: ?Chief Complaint  ?Patient presents with  ? Left Ankle - Fracture, Follow-up  ? ? ?HPI ?Alyssa Elliott comes in today for left ankle pain status post fall 3 weeks ago.  She states she was awakened by her phone going off and she had to go across the room and somehow tripped she is unsure of what exactly happened.  She was seen at [facility] urgent care they states she had a fracture placed her in a cam walker boot.  She states her pain overall is improving.  She has been able to bear full weight in the boot.  She is ambulating without any assistive device otherwise.  She is taking Advil for pain. ? ?Review of Systems ?See HPI otherwise negative ? ?Objective: ?Vital Signs: LMP 09/04/2002 (Exact Date)  ? ?Physical Exam ?Constitutional:   ?   Appearance: She is not  ill-appearing or diaphoretic.  ?Pulmonary:  ?   Effort: Pulmonary effort is normal.  ?Neurological:  ?   Mental Status: She is alert and oriented to person, place, and time.  ?Psychiatric:     ?   Mood and Affect: Mood normal.  ? ? ?Ortho Exam ?Left ankle there is tenderness over the peroneal tendons just posterior to the lateral malleolus.  Tenderness over the distal fibular shaft.  No gross deformity.  Remainder the foot is nontender.  Achilles nontender.  Calf supple nontender.  Stable dorsiflex plantarflex the ankle without significant pain.  She is able to invert and evert the foot without significant pain.  Ecchymosis over the dorsal aspect of the left foot. ?Specialty Comments:  ?No specialty comments available. ? ?Imaging: ?XR Ankle Complete Left ? ?Result Date: 01/10/2022 ?Left ankle 3 views: Talus well located within the ankle mortise no diastases.  There is no sclerotic activity involving the distal fibula.  No evidence of acute fracture. ? ?XR Foot Complete Left ? ?Result Date: 01/10/2022 ?Left foot 3 views: No acute fractures no bony abnormalities.  No subluxations dislocations involving the mid to hindfoot.  ? ? ?PMFS History: ?Patient Active Problem List  ?  Diagnosis Date Noted  ? Port-A-Cath in place 07/31/2020  ? Acute lower UTI 07/18/2020  ? Sepsis ([place]) 07/17/2020  ? 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 Abnormal positron emission tomography (PET) scan 04/03/2020  ? Bronchiectasis without complication ([place]) 09/32/6712  ? Bronchiectasis with acute exacerbation ([place]) 05/21/2019  ? Healthcare maintenance 05/21/2019  ? Chronic left shoulder pain 04/01/2019  ? 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 Lung nodule seen on imaging study 11/12/2012  ? Maxillary sinusitis, chronic 10/27/2012  ? Hemorrhoids 05/04/2012  ? Thrombosed external hemorrhoid 05/04/2012  ? Seasonal and perennial allergic rhinitis 10/16/2011  ? Asthma with bronchitis 10/16/2011  ?  GERD (gastroesophageal reflux disease) 10/16/2011  ? ?Past Medical History:  ?Diagnosis Date  ? Allergy   ? Anxiety   ? Asthma   ? Cataract 2018  ? removed  ? Complication of anesthesia   ? drop in BP  ? DDD (degenerative disc disease)   ? Depression   ? GERD (gastroesophageal reflux disease)   ? Hemorrhoids   ? Hyperlipidemia   ? Lymphoma ([place]) dx 2021  ? no tx for now  ? Vitamin D deficiency   ?  ?Family History  ?Problem Relation Age of Onset  ? Heart disease Mother   ? Diabetes Mother   ? Tuberculosis Paternal Grandfather   ? Bone cancer Paternal Grandfather   ? Breast cancer Sister 75  ? Melanoma Sister   ? Cirrhosis Maternal Grandfather   ? Heart failure Paternal Grandmother   ? Colon cancer Neg Hx   ? Ovarian cancer Neg Hx   ? Uterine cancer Neg Hx   ?  ?Past Surgical History:  ?Procedure Laterality Date  ? [place]  ? SVT  ? cataract surgery Bilateral 2017  ? COLONOSCOPY  2016  ? FOOT NEUROMA SURGERY    ? multiple times bilaterally  ? IR IMAGING GUIDED PORT INSERTION  07/13/2020  ? IR REMOVAL TUN ACCESS W/ PORT W/O FL MOD SED  06/04/2021  ? PLANTAR FASCIA RELEASE Left 1999 or 2000  ? SHOULDER ARTHROSCOPY Left 04/19/2019  ? Procedure: LEFT SHOULDER MANIPULATION UNDER ANESTHESIA AND ARTHROSCOPY WITH EXTENSIVE DEBRIDEMENT;  Surgeon: Mcarthur Rossetti, MD;  Location: [facility];  Service: Orthopedics;  Laterality: Left;  ? UPPER GI ENDOSCOPY  yrs ago  ? ?Social History  ? ?Occupational History  ? Occupation: retired Catering manager  ?Tobacco Use  ? Smoking status: Never  ? Smokeless tobacco: Never  ?Vaping Use  ? Vaping Use: Never used  ?Substance and Sexual Activity  ? Alcohol use: No  ? Drug use: No  ? Sexual activity: Not Currently  ?  Partners: Male  ?  Birth control/protection: Post-menopausal  ? ? ? ? ? ? ?

## 2022-01-12 ENCOUNTER — Encounter: Attending: Family | Primary: Family

## 2022-02-07 ENCOUNTER — Inpatient Hospital Stay: Primary: Family

## 2022-02-07 ENCOUNTER — Ambulatory Visit: Payer: Medicare Other | Admitting: Physician Assistant

## 2022-02-09 ENCOUNTER — Inpatient Hospital Stay: Admit: 2022-02-09 | Payer: MEDICARE | Primary: Family

## 2022-02-09 DIAGNOSIS — Z01818 Encounter for other preprocedural examination: Secondary | ICD-10-CM

## 2022-02-09 DIAGNOSIS — Z01811 Encounter for preprocedural respiratory examination: Secondary | ICD-10-CM

## 2022-02-09 LAB — CBC WITH AUTO DIFFERENTIAL
Basophils %: 0 % (ref 0–1)
Basophils Absolute: 0 10*3/uL (ref 0.0–0.1)
Eosinophils %: 8 % — ABNORMAL HIGH (ref 0–7)
Eosinophils Absolute: 0.5 10*3/uL — ABNORMAL HIGH (ref 0.0–0.4)
Hematocrit: 33 % — ABNORMAL LOW (ref 35.0–47.0)
Hemoglobin: 10.5 g/dL — ABNORMAL LOW (ref 11.5–16.0)
Immature Granulocytes %: 0 % (ref 0.0–0.5)
Immature Granulocytes Absolute: 0 10*3/uL (ref 0.00–0.04)
Lymphocytes %: 23 % (ref 12–49)
Lymphocytes Absolute: 1.3 10*3/uL (ref 0.8–3.5)
MCH: 31.8 PG (ref 26.0–34.0)
MCHC: 31.8 g/dL (ref 30.0–36.5)
MCV: 100 FL — ABNORMAL HIGH (ref 80.0–99.0)
MPV: 11 FL (ref 8.9–12.9)
Monocytes %: 8 % (ref 5–13)
Monocytes Absolute: 0.4 10*3/uL (ref 0.0–1.0)
Neutrophils %: 61 % (ref 32–75)
Neutrophils Absolute: 3.4 10*3/uL (ref 1.8–8.0)
Nucleated RBCs: 0 PER 100 WBC
Platelets: 242 10*3/uL (ref 150–400)
RBC: 3.3 M/uL — ABNORMAL LOW (ref 3.80–5.20)
RDW: 13.2 % (ref 11.5–14.5)
WBC: 5.6 10*3/uL (ref 3.6–11.0)
nRBC: 0 10*3/uL (ref 0.00–0.01)

## 2022-02-09 LAB — COMPREHENSIVE METABOLIC PANEL
ALT: 14 U/L (ref 12–78)
AST: 11 U/L — ABNORMAL LOW (ref 15–37)
Albumin/Globulin Ratio: 1.4 (ref 1.1–2.2)
Albumin: 3.7 g/dL (ref 3.5–5.0)
Alk Phosphatase: 125 U/L — ABNORMAL HIGH (ref 45–117)
Anion Gap: 5 mmol/L (ref 5–15)
BUN/Creatinine Ratio: 27 — ABNORMAL HIGH (ref 12–20)
BUN: 39 MG/DL — ABNORMAL HIGH (ref 6–20)
CO2: 25 mmol/L (ref 21–32)
Calcium: 9.9 MG/DL (ref 8.5–10.1)
Chloride: 110 mmol/L — ABNORMAL HIGH (ref 97–108)
Creatinine: 1.45 MG/DL — ABNORMAL HIGH (ref 0.55–1.02)
Est, Glom Filt Rate: 38 mL/min/{1.73_m2} — ABNORMAL LOW (ref >60)
Globulin: 2.7 g/dL (ref 2.0–4.0)
Glucose: 168 mg/dL — ABNORMAL HIGH (ref 65–100)
Potassium: 4.2 mmol/L (ref 3.5–5.1)
Sodium: 140 mmol/L (ref 136–145)
Total Bilirubin: 0.2 MG/DL (ref 0.2–1.0)
Total Protein: 6.4 g/dL (ref 6.4–8.2)

## 2022-02-09 LAB — EKG 12-LEAD
Atrial Rate: 55 {beats}/min
P Axis: 66 degrees
P-R Interval: 146 ms
Q-T Interval: 444 ms
QRS Duration: 112 ms
QTc Calculation (Bazett): 424 ms
R Axis: 1 degrees
T Axis: 114 degrees
Ventricular Rate: 55 {beats}/min

## 2022-02-09 LAB — PROTIME-INR
INR: 1 (ref 0.9–1.1)
Protime: 10.3 s (ref 9.0–11.1)

## 2022-02-09 LAB — HEMOGLOBIN A1C
Estimated Avg Glucose: 174 mg/dL
Hemoglobin A1C: 7.7 % — ABNORMAL HIGH (ref 4.0–5.6)

## 2022-02-09 LAB — APTT: APTT: 26.8 s (ref 22.1–31.0)

## 2022-02-09 NOTE — Other (Signed)
INSTRUCTIONS GIVEN AND REVIEWED, 2 BOTTLES OF SOAP GIVEN, PT GIVEN TIME TO ASK QUESTIONS     EKG PERFORMED    INSTRUCTIONS GIVEN TO OBTAIN CHEST XRAY

## 2022-02-09 NOTE — Other (Signed)
[facility] INSTRUCTIONS    Surgery Date:   02/16/2022    Your surgeon's office or [facility] staff will call you between 4 PM- 8 PM the day before surgery with your arrival time. If your surgery is on a Monday, you will receive a call the preceding Friday.     Please report to [facility] Patient Access/Admitting on the 1st floor.  Bring your insurance card, photo identification, and any copayment ( if applicable).   If you are going home the same day of your surgery, you must have a responsible adult to drive you home. You need to have a responsible adult to stay with you the first 24 hours after surgery and you should not drive a car for 24 hours following your surgery.  Nothing to eat or drink after midnight the night before surgery. This includes no water, gum, mints, coffee, juice, etc.  Please note special instructions, if applicable, below for medications.  Do NOT drink alcohol or smoke 24 hours before surgery. STOP smoking for 14 days prior as it helps with breathing and healing after surgery.  If you are being admitted to the hospital, please leave personal belongings/luggage in your car until you have an assigned hospital room number.  Please wear comfortable clothes. Wear your glasses instead of contacts. We ask that all money, jewelry and valuables be left at home. Wear no make up, particularly mascara, the day of surgery.    All body piercings, rings, and jewelry need to be removed and left at home. Please remove any nail polish or artificial nails from your fingernails. Please wear your hair loose or down. Please no pony-tails, buns, or any metal hair accessories. If you shower the morning of surgery, please do not apply any lotions or powders afterwards.  You may wear deodorant, unless having breast surgery. Do not shave any body area within 24 hours of your surgery.  Please follow all instructions to avoid any potential surgical cancellation.  Should your physical condition  change, (i.e. fever, cold, flu, etc.) please notify your surgeon as soon as possible.  It is important to be on time. If a situation occurs where you may be delayed, please call:  [phone] / [phone] on the day of surgery.  The Preadmission Testing staff can be reached at [phone].  Special instructions: CHECK ABOUT ATORVASTATIN- ARE YOU SUPPOSED TO TAKING?      Current Outpatient Medications   Medication Sig    sacubitril-valsartan (ENTRESTO) 49-51 MG per tablet Take 1 tablet by mouth 2 times daily    furosemide (LASIX) 20 MG tablet Take 1 tablet by mouth as needed WEIGHT GAIN    gabapentin (NEURONTIN) 300 MG capsule Take 1 capsule by mouth nightly as needed. Max Daily Amount: 300 mg    HYDROcodone-acetaminophen (NORCO) 7.5-325 MG per tablet Take 1 tablet by mouth every 6 hours as needed for Pain. Max Daily Amount: 4 tablets    aspirin 81 MG EC tablet Take by mouth daily    atorvastatin (LIPITOR) 20 MG tablet Take by mouth daily    carvedilol (COREG) 3.125 MG tablet Take 2 tablets by mouth 2 times daily (with meals)    empagliflozin (JARDIANCE) 10 MG tablet Take by mouth daily    metFORMIN (GLUCOPHAGE-XR) 500 MG extended release tablet Take 2 tablets by mouth Daily with supper    pantoprazole (PROTONIX) 20 MG tablet ceived the following from [facility]: Outside name: pantoprazole (PROTONIX)  20 mg tablet    spironolactone (ALDACTONE) 50 MG tablet Take by mouth daily     No current facility-administered medications for this encounter.       YOU MUST ONLY TAKE THESE MEDICATIONS THE MORNING OF SURGERY WITH A SIP OF WATER: ATORVASTATIN, CARVEDILOL, PANTOPRAZOLE,    MEDICATIONS TO TAKE THE MORNING OF SURGERY ONLY IF NEEDED: TYLENOL,  HYDROCODONE-ACETAMINOPHEN-LAST DOSE IS 4 HOURS PRIOR TO ARRIVAL    HOLD these prescription medications BEFORE Surgery: DO NOT TAKE MORNING OF SURGERY: JARDIANCE, ENTRESTO, LASIX, SPIRONOLACTONE,   STOP TAKING ON 02/15/2022: METFORMIN      Ask your  surgeon/prescribing physician about when/if to STOP taking these medications: ASPIRIN    Stop all vitamins, herbal medicines and Aspirin containing products 7 days prior to surgery. Stop any non-steroidal anti-inflammatory drugs (i.e. Ibuprofen, Naproxen, Advil, Aleve) 3 days before surgery. You may take Tylenol.      If you are currently taking Plavix, Coumadin, or any other blood-thinning/anticoagulant medication contact your prescribing physician for instructions.    Preventing Infections Before and After - Your Surgery    IMPORTANT INSTRUCTIONS      You play an important role in your health and preparation for surgery. To reduce the germs on your skin you will need to shower with CHG soap (Chorhexidine gluconate 4%) two times before surgery.    CHG soap (Hibiclens, Hex-A-Clens or store brand)  CHG soap will be provided at your Preadmission Testing (PAT) appointment.  If you do not have a PAT appointment before surgery, you may arrange to pick up CHG soap from our office or purchase CHG soap at a pharmacy, grocery or department store.  You need to purchase TWO 4 ounce bottles to use for your 2 showers.    Steps to follow:  Wash your hair with your normal shampoo and your body with regular soap and rinse well to remove shampoo and soap from your skin.  Wet a clean washcloth and turn off the shower.  Put CHG soap on washcloth and apply to your entire body from the neck down. Do not use on your head, face or private parts(genitals). Do not use CHG soap on open sores, wounds or areas of skin irritation.  Wash you body gently for 5 minutes. Do not wash your skin too hard. This soap does not create lather. Pay special attention to your underarms and from your belly button to your feet.  Turn the shower back on and rinse well to get CHG soap off your body.  Pat your skin dry with a clean, dry towel. Do not apply lotions or moisturizer.  Put on clean clothes and sleep on fresh bed sheets and do not allow pets to sleep  with you.    Shower with CHG soap 2 times before your surgery  The evening before your surgery  The morning of your surgery      Tips to help prevent infections after your surgery:  Protect your surgical wound from germs:  Hand washing is the most important thing you and your caregivers can do to prevent infections.  Keep your bandage clean and dry!  Do not touch your surgical wound.  Use clean, freshly washed towels and washcloths every time you shower; do not share bath linens with others.  Until your surgical wound is healed, wear clothing and sleep on bed linens each day that are clean and freshly washed.  Do not allow pets to sleep in your bed with you or touch your  surgical wound.  Do not smoke - smoking delays wound healing. This may be a good time to stop smoking.  If you have diabetes, it is important for you to manage your blood sugar levels properly before your surgery as well as after your surgery. Poorly managed blood sugar levels slow down wound healing and prevent you from healing completely.          Patient Information Regarding COVID Restrictions    Day of Procedure    Please park in the parking deck or any designated visitor parking lot.  Enter the facility through the Main Entrance of the hospital.  On the day of surgery, please provide the cell phone number of the person who will be waiting for you to the Patient Access representative at the time of registration.  Masks are highly recommended in the hospital, but not required.  Once your procedure and the immediate recovery period is completed, a nurse in the recovery area will contact your designated visitor to inform them of your room number or to otherwise review other pertinent information regarding your care.    Social distancing practices are strongly encouraged in waiting areas and the cafeteria.       The patient was contacted in person.   She verbalized understanding of all instructions does not need reinforcement.

## 2022-02-11 NOTE — Other (Signed)
EKG REVIEWED BY Park Liter, NP.  OK FOR SURGERY.

## 2022-02-16 ENCOUNTER — Ambulatory Visit: Admit: 2022-02-16 | Payer: MEDICARE | Primary: Family

## 2022-02-16 ENCOUNTER — Inpatient Hospital Stay
Admit: 2022-02-16 | Discharge: 2022-02-18 | Disposition: A | Discharge status: Home / Self Care | Payer: MEDICARE | Attending: Surgery | Admitting: Surgery

## 2022-02-16 DIAGNOSIS — I739 Peripheral vascular disease, unspecified: Secondary | ICD-10-CM

## 2022-02-16 DIAGNOSIS — E1151 Type 2 diabetes mellitus with diabetic peripheral angiopathy without gangrene: Principal | ICD-10-CM

## 2022-02-16 LAB — POCT GLUCOSE
POC Glucose: 173 mg/dL — ABNORMAL HIGH (ref 65–117)
POC Glucose: 124 mg/dL — ABNORMAL HIGH (ref 65–117)

## 2022-02-16 MED ORDER — PROPOFOL 200 MG/20ML IV EMUL
200 MG/20ML | INTRAVENOUS | Status: AC
Start: 2022-02-16 — End: ?

## 2022-02-16 MED ORDER — PHENYLEPHRINE HCL 10 MG/ML IV SOLN
10 MG/ML | INTRAVENOUS | Status: AC
Start: 2022-02-16 — End: ?

## 2022-02-16 MED ORDER — NEOSTIGMINE METHYLSULFATE 3 MG/3ML IV SOSY
3 MG/ML | INTRAVENOUS | Status: AC
Start: 2022-02-16 — End: ?

## 2022-02-16 MED ORDER — ACETAMINOPHEN 500 MG PO TABS
500 MG | Freq: Once | ORAL | Status: AC
Start: 2022-02-16 — End: 2022-02-16
  Administered 2022-02-16: 16:00:00 1000 mg via ORAL

## 2022-02-16 MED ORDER — MIDAZOLAM HCL (PF) 2 MG/2ML IJ SOLN
2 MG/ML | Freq: Once | INTRAMUSCULAR | Status: DC | PRN
Start: 2022-02-16 — End: 2022-02-16

## 2022-02-16 MED ORDER — ROCURONIUM BROMIDE 50 MG/5ML IV SOLN
50 MG/5ML | INTRAVENOUS | Status: DC | PRN
Start: 2022-02-16 — End: 2022-02-16
  Administered 2022-02-16: 17:00:00 5 via INTRAVENOUS
  Administered 2022-02-16: 16:00:00 45 via INTRAVENOUS

## 2022-02-16 MED ORDER — PAPAVERINE HCL 30 MG/ML IJ SOLN
30 MG/ML | INTRAMUSCULAR | Status: AC
Start: 2022-02-16 — End: ?

## 2022-02-16 MED ORDER — SODIUM CHLORIDE 0.9 % IV SOLN
0.9 % | INTRAVENOUS | Status: DC | PRN
Start: 2022-02-16 — End: 2022-02-16

## 2022-02-16 MED ORDER — LIDOCAINE HCL (PF) 2 % IJ SOLN
2 % | INTRAMUSCULAR | Status: DC | PRN
Start: 2022-02-16 — End: 2022-02-16
  Administered 2022-02-16: 16:00:00 60 via INTRAVENOUS

## 2022-02-16 MED ORDER — PHENYLEPHRINE HCL 0.4 MG/10ML IV SOSY
0.4 MG/10ML | INTRAVENOUS | Status: DC | PRN
Start: 2022-02-16 — End: 2022-02-16
  Administered 2022-02-16: 19:00:00 120 via INTRAVENOUS

## 2022-02-16 MED ORDER — HEPARIN SODIUM (PORCINE) 1000 UNIT/ML IJ SOLN
1000 UNIT/ML | INTRAMUSCULAR | Status: DC | PRN
Start: 2022-02-16 — End: 2022-02-16
  Administered 2022-02-16: 18:00:00 500

## 2022-02-16 MED ORDER — SODIUM CHLORIDE 0.9 % IV SOLN
0.9 % | INTRAVENOUS | Status: AC
Start: 2022-02-16 — End: 2022-02-17
  Administered 2022-02-16 – 2022-02-17 (×2): via INTRAVENOUS

## 2022-02-16 MED ORDER — HEPARIN SODIUM (PORCINE) 1000 UNIT/ML IJ SOLN
1000 UNIT/ML | INTRAMUSCULAR | Status: AC
Start: 2022-02-16 — End: ?

## 2022-02-16 MED ORDER — PHENYLEPHRINE HCL 0.4 MG/10ML IV SOSY
0.4 MG/10ML | INTRAVENOUS | Status: AC
Start: 2022-02-16 — End: ?

## 2022-02-16 MED ORDER — HYDROCODONE-ACETAMINOPHEN 7.5-325 MG PO TABS
7.5-325 | ORAL | Status: DC | PRN
Start: 2022-02-16 — End: 2022-02-18
  Administered 2022-02-17 – 2022-02-18 (×3): 1 via ORAL

## 2022-02-16 MED ORDER — ONDANSETRON HCL 4 MG/2ML IJ SOLN
4 MG/2ML | INTRAMUSCULAR | Status: DC | PRN
Start: 2022-02-16 — End: 2022-02-16
  Administered 2022-02-16: 16:00:00 4 via INTRAVENOUS

## 2022-02-16 MED ORDER — MIDAZOLAM 2 MG/2ML INJ (NDG ONLY)
2 MG/ML | INTRAMUSCULAR | Status: AC
Start: 2022-02-16 — End: ?

## 2022-02-16 MED ORDER — EPHEDRINE SULFATE (PRESSORS) 50 MG/ML IV SOLN
50 MG/ML | INTRAVENOUS | Status: AC
Start: 2022-02-16 — End: ?

## 2022-02-16 MED ORDER — MIDAZOLAM HCL 2 MG/2ML IJ SOLN
2 MG/ML | INTRAMUSCULAR | Status: DC | PRN
Start: 2022-02-16 — End: 2022-02-16
  Administered 2022-02-16: 16:00:00 1 via INTRAVENOUS

## 2022-02-16 MED ORDER — INSULIN LISPRO 100 UNIT/ML IJ SOLN
100 UNIT/ML | Freq: Three times a day (TID) | INTRAMUSCULAR | Status: AC
Start: 2022-02-16 — End: 2022-02-18

## 2022-02-16 MED ORDER — EPHEDRINE SULFATE (PRESSORS) 50 MG/ML IV SOLN
50 MG/ML | INTRAVENOUS | Status: DC | PRN
Start: 2022-02-16 — End: 2022-02-16
  Administered 2022-02-16 (×2): 10 via INTRAVENOUS

## 2022-02-16 MED ORDER — MORPHINE SULFATE (PF) 4 MG/ML IV SOLN
4 | INTRAVENOUS | Status: DC | PRN
Start: 2022-02-16 — End: 2022-02-18
  Administered 2022-02-17 (×2): 4 mg via INTRAVENOUS

## 2022-02-16 MED ORDER — DEXAMETHASONE SODIUM PHOSPHATE 4 MG/ML IJ SOLN
4 MG/ML | INTRAMUSCULAR | Status: AC
Start: 2022-02-16 — End: ?

## 2022-02-16 MED ORDER — ONDANSETRON HCL 4 MG/2ML IJ SOLN
4 MG/2ML | INTRAMUSCULAR | Status: AC
Start: 2022-02-16 — End: ?

## 2022-02-16 MED ORDER — NORMAL SALINE FLUSH 0.9 % IV SOLN
0.9 % | INTRAVENOUS | Status: DC | PRN
Start: 2022-02-16 — End: 2022-02-16

## 2022-02-16 MED ORDER — FENTANYL CITRATE (PF) 100 MCG/2ML IJ SOLN
100 MCG/2ML | INTRAMUSCULAR | Status: DC | PRN
Start: 2022-02-16 — End: 2022-02-16
  Administered 2022-02-16: 17:00:00 25 via INTRAVENOUS
  Administered 2022-02-16: 16:00:00 50 via INTRAVENOUS

## 2022-02-16 MED ORDER — FENTANYL CITRATE (PF) 100 MCG/2ML IJ SOLN
100 MCG/2ML | Freq: Once | INTRAMUSCULAR | Status: DC | PRN
Start: 2022-02-16 — End: 2022-02-16

## 2022-02-16 MED ORDER — LACTATED RINGERS IV SOLN
INTRAVENOUS | Status: DC
Start: 2022-02-16 — End: 2022-02-16

## 2022-02-16 MED ORDER — IOHEXOL 240 MG/ML IJ SOLN
240 MG/ML | INTRAMUSCULAR | Status: AC
Start: 2022-02-16 — End: ?

## 2022-02-16 MED ORDER — HYDROMORPHONE HCL PF 1 MG/ML IJ SOLN
1 MG/ML | INTRAMUSCULAR | Status: AC | PRN
Start: 2022-02-16 — End: 2022-02-16
  Administered 2022-02-16 (×2): 0.5 mg via INTRAVENOUS

## 2022-02-16 MED ORDER — NORMAL SALINE FLUSH 0.9 % IV SOLN
0.9 % | Freq: Two times a day (BID) | INTRAVENOUS | Status: DC
Start: 2022-02-16 — End: 2022-02-16

## 2022-02-16 MED ORDER — IOHEXOL 240 MG/ML IJ SOLN
240 MG/ML | INTRAMUSCULAR | Status: DC | PRN
Start: 2022-02-16 — End: 2022-02-16
  Administered 2022-02-16: 18:00:00 28 via INTRAVENOUS

## 2022-02-16 MED ORDER — PROPOFOL 100 MG/10ML IV EMUL
100 MG/10ML | INTRAVENOUS | Status: DC | PRN
Start: 2022-02-16 — End: 2022-02-16
  Administered 2022-02-16: 16:00:00 50 via INTRAVENOUS
  Administered 2022-02-16: 16:00:00 100 via INTRAVENOUS

## 2022-02-16 MED ORDER — SODIUM CHLORIDE 0.9 % IV SOLN
0.9 % | INTRAVENOUS | Status: DC | PRN
Start: 2022-02-16 — End: 2022-02-16
  Administered 2022-02-16 (×2): via INTRAVENOUS

## 2022-02-16 MED ORDER — NEOSTIGMINE METHYLSULFATE 3 MG/3ML IV SOSY
3 MG/ML | INTRAVENOUS | Status: DC | PRN
Start: 2022-02-16 — End: 2022-02-16
  Administered 2022-02-16: 18:00:00 3 via INTRAVENOUS

## 2022-02-16 MED ORDER — ONDANSETRON HCL 4 MG/2ML IJ SOLN
4 MG/2ML | Freq: Once | INTRAMUSCULAR | Status: DC | PRN
Start: 2022-02-16 — End: 2022-02-16

## 2022-02-16 MED ORDER — HEPARIN SODIUM (PORCINE) 1000 UNIT/ML IJ SOLN
1000 UNIT/ML | INTRAMUSCULAR | Status: DC | PRN
Start: 2022-02-16 — End: 2022-02-16
  Administered 2022-02-16: 17:00:00 5000 via INTRAVENOUS

## 2022-02-16 MED ORDER — LIDOCAINE HCL (PF) 1 % IJ SOLN
1 % | Freq: Once | INTRAMUSCULAR | Status: DC | PRN
Start: 2022-02-16 — End: 2022-02-16

## 2022-02-16 MED ORDER — OXYCODONE HCL 5 MG PO TABS
5 MG | Freq: Once | ORAL | Status: DC | PRN
Start: 2022-02-16 — End: 2022-02-16

## 2022-02-16 MED ORDER — FENTANYL CITRATE (PF) 100 MCG/2ML IJ SOLN
100 MCG/2ML | INTRAMUSCULAR | Status: AC
Start: 2022-02-16 — End: ?

## 2022-02-16 MED ORDER — GLYCOPYRROLATE PF 0.4 MG/2ML IJ SOSY
0.4 MG/2ML | INTRAMUSCULAR | Status: AC
Start: 2022-02-16 — End: ?

## 2022-02-16 MED ORDER — GLYCOPYRROLATE PF 0.4 MG/2ML IJ SOSY
0.4 MG/2ML | INTRAMUSCULAR | Status: DC | PRN
Start: 2022-02-16 — End: 2022-02-16
  Administered 2022-02-16: 18:00:00 .4 via INTRAVENOUS
  Administered 2022-02-16: 17:00:00 .2 via INTRAVENOUS

## 2022-02-16 MED ORDER — FENTANYL CITRATE (PF) 100 MCG/2ML IJ SOLN
100 MCG/2ML | INTRAMUSCULAR | Status: AC | PRN
Start: 2022-02-16 — End: 2022-02-16
  Administered 2022-02-16 (×4): 25 ug via INTRAVENOUS

## 2022-02-16 MED ORDER — CEFAZOLIN SODIUM 1 G IJ SOLR
1 g | INTRAMUSCULAR | Status: DC
Start: 2022-02-16 — End: 2022-02-16

## 2022-02-16 MED ORDER — KETAMINE HCL 50 MG/5ML IJ SOSY
50 MG/5ML | INTRAMUSCULAR | Status: AC
Start: 2022-02-16 — End: ?

## 2022-02-16 MED ORDER — PHENYLEPHRINE HCL 10 MG/ML SOLN (MIXTURES ONLY)
10 MG/ML | INTRAVENOUS | Status: DC | PRN
Start: 2022-02-16 — End: 2022-02-16
  Administered 2022-02-16: 16:00:00 60 via INTRAVENOUS

## 2022-02-16 MED ORDER — ROCURONIUM BROMIDE 50 MG/5ML IV SOLN
50 MG/5ML | INTRAVENOUS | Status: AC
Start: 2022-02-16 — End: ?

## 2022-02-16 MED ORDER — LIDOCAINE HCL (PF) 2 % IJ SOLN
2 % | INTRAMUSCULAR | Status: AC
Start: 2022-02-16 — End: ?

## 2022-02-16 MED ORDER — CEFAZOLIN SODIUM 1 G IJ SOLR
1 g | INTRAMUSCULAR | Status: DC | PRN
Start: 2022-02-16 — End: 2022-02-16
  Administered 2022-02-16: 16:00:00 2 via INTRAVENOUS

## 2022-02-16 MED FILL — NEOSTIGMINE METHYLSULFATE 3 MG/3ML IV SOSY: 3 MG/ML | INTRAVENOUS | Qty: 3

## 2022-02-16 MED FILL — ONDANSETRON HCL 4 MG/2ML IJ SOLN: 4 MG/2ML | INTRAMUSCULAR | Qty: 2

## 2022-02-16 MED FILL — PAPAVERINE HCL 30 MG/ML IJ SOLN: 30 MG/ML | INTRAMUSCULAR | Qty: 2

## 2022-02-16 MED FILL — GLYCOPYRROLATE PF 0.4 MG/2ML IJ SOSY: 0.4 MG/2ML | INTRAMUSCULAR | Qty: 4

## 2022-02-16 MED FILL — FENTANYL CITRATE (PF) 100 MCG/2ML IJ SOLN: 100 MCG/2ML | INTRAMUSCULAR | Qty: 2

## 2022-02-16 MED FILL — MIDAZOLAM HCL 2 MG/2ML IJ SOLN: 2 MG/ML | INTRAMUSCULAR | Qty: 2

## 2022-02-16 MED FILL — NORMAL SALINE FLUSH 0.9 % IV SOLN: 0.9 % | INTRAVENOUS | Qty: 40

## 2022-02-16 MED FILL — PHENYLEPHRINE HCL (PRESSORS) 0.4 MG/10ML IV SOSY: 0.4 MG/10ML | INTRAVENOUS | Qty: 10

## 2022-02-16 MED FILL — CEFAZOLIN SODIUM 1 G IJ SOLR: 1 g | INTRAMUSCULAR | Qty: 2000

## 2022-02-16 MED FILL — LACTATED RINGERS IV SOLN: INTRAVENOUS | Qty: 1000

## 2022-02-16 MED FILL — XYLOCAINE-MPF 1 % IJ SOLN: 1 % | INTRAMUSCULAR | Qty: 2

## 2022-02-16 MED FILL — OMNIPAQUE 240 MG/ML IJ SOLN: 240 MG/ML | INTRAMUSCULAR | Qty: 50

## 2022-02-16 MED FILL — LIDOCAINE HCL (PF) 2 % IJ SOLN: 2 % | INTRAMUSCULAR | Qty: 5

## 2022-02-16 MED FILL — DIPRIVAN 200 MG/20ML IV EMUL: 200 MG/20ML | INTRAVENOUS | Qty: 40

## 2022-02-16 MED FILL — HEPARIN SODIUM (PORCINE) 1000 UNIT/ML IJ SOLN: 1000 UNIT/ML | INTRAMUSCULAR | Qty: 2

## 2022-02-16 MED FILL — DEXAMETHASONE SODIUM PHOSPHATE 4 MG/ML IJ SOLN: 4 MG/ML | INTRAMUSCULAR | Qty: 1

## 2022-02-16 MED FILL — PHENYLEPHRINE HCL (PRESSORS) 10 MG/ML IV SOLN: 10 MG/ML | INTRAVENOUS | Qty: 1

## 2022-02-16 MED FILL — HYDROMORPHONE HCL 1 MG/ML IJ SOLN: 1 MG/ML | INTRAMUSCULAR | Qty: 1

## 2022-02-16 MED FILL — KETAMINE HCL 50 MG/5ML IJ SOSY: 50 MG/5ML | INTRAMUSCULAR | Qty: 5

## 2022-02-16 MED FILL — ACETAMINOPHEN EXTRA STRENGTH 500 MG PO TABS: 500 MG | ORAL | Qty: 2

## 2022-02-16 MED FILL — SODIUM CHLORIDE 0.9 % IV SOLN: 0.9 % | INTRAVENOUS | Qty: 1000

## 2022-02-16 MED FILL — EPHEDRINE SULFATE (PRESSORS) 50 MG/ML IV SOLN: 50 MG/ML | INTRAVENOUS | Qty: 1

## 2022-02-16 MED FILL — ROCURONIUM BROMIDE 50 MG/5ML IV SOLN: 50 MG/5ML | INTRAVENOUS | Qty: 15

## 2022-02-16 NOTE — Other (Signed)
02/16/22 1242   Family Communication   Contact Person Relationship to Patient Daughter   Contact Person Phone Number [name]   Family/Significant Other Update Called   Delivery Origin Nurse   Message Disposition Family present - message delivered   Update Given Yes   Family Communication   Family Update Message Procedure started

## 2022-02-16 NOTE — Brief Op Note (Signed)
Brief Postoperative Note      Patient: Lindsey Huynh  Date of Birth: 1950/11/28  MRN: [id number]    Date of Procedure: 2022-03-06    Pre-Op Diagnosis Codes:     * Peripheral vascular disease ([place]) [I73.9]    Post-Op Diagnosis: Same       Procedure(s):  RIGHT FEMORAL POPLITEAL BYPASS WITH PTFE, ANGIOPLASTY OF POPLITEAL AND TIBIAL ARTERIES    Surgeon(s):  Belenda Cruise, MD    Assistant:  * No surgical staff found *    Anesthesia: General    Estimated Blood Loss (mL): less than 893     Complications: None    Specimens:   * No specimens in log *    Implants:  Implant Name Type Inv. Item Serial No. Manufacturer Lot No. LRB No. Used Action   GRAFT VASC L40CM DIA7MM EPTFE HEP THN WALLED PROPATEN - [id number] Vascular grafts GRAFT VASC L40CM DIA7MM EPTFE HEP THN WALLED PROPATEN [id number] PP005 [employer] N/A Right 1 Implanted         Drains:   Urinary Catheter 03/06/2022 Foley;2 Way (Active)   $ Urethral catheter insertion Inserted for procedure 03/06/2022 1249   Catheter Indications Perioperative use for selected surgical procedures March 06, 2022 1249   Urine Color Yellow 03-06-22 1249   Urine Appearance Clear 06-Mar-2022 1249   Collection Container Standard 06-Mar-2022 1249   Securement Method Securing device (Describe) 2022/03/06 1249   Catheter Care  Perineal wipes 2022/03/06 1249   Catheter Best Practices  Drainage tube clipped to bed;Catheter secured to thigh;Tamper seal intact;Bag below bladder;Bag not on floor;Lack of dependent loop in tubing;Drainage bag less than half full 03/06/22 1249   Status Draining;Patent 03/06/2022 1249       Findings: popliteal and tibial stenoses distal to bypass treated with PTA with good results      Electronically signed by Luiz Ochoa, MD on 03-06-22 at 2:30 PM

## 2022-02-16 NOTE — Anesthesia Post-Procedure Evaluation (Signed)
Post-Anesthesia Evaluation and Assessment    Patient: Lindsey Huynh MRN: [id number]  SSN: [id number]    Date of Birth: [birth year]  Age: 71 y.o.  Sex: female      I have evaluated the patient and they are stable and ready for discharge from the PACU.     Cardiovascular Function/Vital Signs  Visit Vitals  BP 101/69   Pulse 72   Temp 97.1 F (36.2 C) (Axillary)   Resp 17   Ht 1.626 m ('5\' 4"'$ )   Wt 79 kg (174 lb 2.6 oz)   SpO2 99%   BMI 29.90 kg/m       Patient is status post General anesthesia for Procedure(s):  RIGHT FEMORAL POPLITEAL BYPASS WITH PTFE, ANGIOPLASTY OF POPLITEAL AND TIBIAL ARTERIES.    Nausea/Vomiting: None    Postoperative hydration reviewed and adequate.    Pain:  Managed    Neurological Status:   At baseline    Mental Status, Level of Consciousness: Alert and  oriented to person, place, and time    Pulmonary Status:   Adequate oxygenation and airway patent    Complications related to anesthesia: None    Post-anesthesia assessment completed. No concerns    Signed By: Welford Roche, MD     Feb 16, 2022

## 2022-02-16 NOTE — Progress Notes (Signed)
Received to room 445 @ 1950, A&O X 4, denies pian, drg to rt thigh and rt groin D&I, rt foot warm to touch with + pulses, family at bedside

## 2022-02-16 NOTE — Other (Signed)
1915: TRANSFER - OUT REPORT:    Verbal report given to Tammy RN(name) on Lindsey Huynh  being transferred to 4W(unit) for routine post-op       Report consisted of patient's Situation, Background, Assessment and   Recommendations(SBAR).     Time Pre op antibiotic given:1226  Anesthesia Stop time: 3570    Information from the following report(s) SBAR, Kardex, Procedure Summary, Intake/Output, MAR, and Recent Results was reviewed with the receiving nurse.    Opportunity for questions and clarification was provided.     Is the patient on 02? No       L/Min 0       Other none    Is the patient on a monitor? Yes    Is the nurse transporting with the patient? Yes    Surgical Waiting Area notified of patient's transfer from PACU? Yes

## 2022-02-16 NOTE — Anesthesia Pre-Procedure Evaluation (Signed)
Department of Anesthesiology  Preprocedure Note       Name:  Lindsey Huynh   Age:  71 y.o.  DOB:  May 06, 1951                                          MRN:  [id number]         Date:  02/16/2022      Surgeon: Juliann Mule):  Belenda Cruise, MD    Procedure: Procedure(s):  RIGHT FEMORAL POPLITEAL BYPASS WITH PTFE, POSSIBLE BALLOON ANGIOPLASTY    Medications prior to admission:   Prior to Admission medications    Medication Sig Start Date End Date Taking? Authorizing Provider   sacubitril-valsartan (ENTRESTO) 49-51 MG per tablet Take 1 tablet by mouth 2 times daily 10/30/20   Historical Provider, MD   furosemide (LASIX) 20 MG tablet Take 1 tablet by mouth as needed WEIGHT GAIN    Historical Provider, MD   gabapentin (NEURONTIN) 300 MG capsule Take 1 capsule by mouth nightly as needed. Max Daily Amount: 300 mg    Historical Provider, MD   HYDROcodone-acetaminophen (NORCO) 7.5-325 MG per tablet Take 1 tablet by mouth every 6 hours as needed for Pain. Max Daily Amount: 4 tablets    Historical Provider, MD   aspirin 81 MG EC tablet Take by mouth daily    Ar Automatic Reconciliation   atorvastatin (LIPITOR) 20 MG tablet Take by mouth daily 08/25/20   Ar Automatic Reconciliation   carvedilol (COREG) 3.125 MG tablet Take 2 tablets by mouth 2 times daily (with meals) 04/21/20   Ar Automatic Reconciliation   empagliflozin (JARDIANCE) 10 MG tablet Take by mouth daily 10/30/20   Ar Automatic Reconciliation   metFORMIN (GLUCOPHAGE-XR) 500 MG extended release tablet Take 2 tablets by mouth Daily with supper 11/12/21   Ar Automatic Reconciliation   pantoprazole (PROTONIX) 20 MG tablet ceived the following from [facility]: Outside name: pantoprazole (PROTONIX) 20 mg tablet 10/30/20   Ar Automatic Reconciliation   spironolactone (ALDACTONE) 50 MG tablet Take by mouth daily 09/13/21   Ar Automatic Reconciliation       Current medications:    No current facility-administered medications for this encounter.       Allergies:  No  Known Allergies    Problem List:    Patient Active Problem List   Diagnosis Code   . 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 GERD (gastroesophageal reflux disease) K21.9   . Diabetes ([place]) E11.9   . Constipation K59.00   . Gangrene ([place]) I96   . Cataract of left eye H26.9   . HTN (hypertension) I10   . Postmenopausal Z78.0   . Type 2 diabetes mellitus with diabetic neuropathy (HCC) E11.40   . Hypercholesterolemia E78.00   . 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 Chronic renal disease, stage III (HCC) N18.30       Past Medical History:        Diagnosis Date   . Arthritis    . CAD (coronary artery disease)    . CHF (congestive heart failure) ([place])    . Diabetes ([place])     insulin + metformin TYPE 2   . GERD (gastroesophageal reflux disease)    . Hypercholesterolemia    . Hypertension    . PAD (peripheral  artery disease) [facility])        Past Surgical History:        Procedure Laterality Date   . COLONOSCOPY  2019   . CORONARY ARTERY BYPASS GRAFT  12/25/2020    x2, [facility]    . CORONARY ARTERY BYPASS GRAFT  12/25/2020   . EYE SURGERY Right     CATARACT REMOVAL   . FEMORAL BYPASS Left 11/28/2019     Dr Timmothy Sours   . 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 Duwaine Maxin   . HYSTERECTOMY (CERVIX STATUS UNKNOWN)  1983   . ORTHOPEDIC SURGERY  06-30-10    back surgery (tumor removed)   . TUMOR REMOVAL  06/30/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       Social History:    Social History     Tobacco Use   . Smoking status: Former     Packs/day: 0.50     Years: 10.00     Pack years: 5.00     Types: Cigarettes     Quit date: 2003     Years since quitting: 20.4     Passive exposure: Current   . Smokeless tobacco: Never   Substance Use Topics   . Alcohol use: Yes     Alcohol/week: 3.0 standard drinks     Types: 1 Cans of beer, 2 Shots of liquor per week     Comment: SOCIALLY                                Counseling given: Not Answered      Vital Signs  (Current): There were no vitals filed for this visit.                                           BP Readings from Last 3 Encounters:   02/09/22 133/62   01/20/21 (!) 165/88   11/19/20 (!) 154/87       NPO Status:                                                                                 BMI:   Wt Readings from Last 3 Encounters:   02/09/22 79 kg (174 lb 2.6 oz)   01/20/21 74.6 kg (164 lb 6.4 oz)   11/19/20 71.8 kg (158 lb 6.4 oz)     There is no height or weight on file to calculate BMI.    CBC:   Lab Results   Component Value Date/Time    WBC 5.6 02/09/2022 08:49 AM    RBC 3.30 02/09/2022 08:49 AM    HGB 10.5 02/09/2022 08:49 AM    HCT 33.0 02/09/2022 08:49 AM    MCV 100.0 02/09/2022 08:49 AM    RDW 13.2 02/09/2022 08:49 AM    PLT 242 02/09/2022 08:49 AM       CMP:   Lab Results   Component Value Date/Time    NA 140 02/09/2022 08:49 AM  K 4.2 02/09/2022 08:49 AM    CL 110 02/09/2022 08:49 AM    CO2 25 02/09/2022 08:49 AM    BUN 39 02/09/2022 08:49 AM    CREATININE 1.45 02/09/2022 08:49 AM    GFRAA >60 08/21/2020 11:49 AM    AGRATIO 1.1 08/21/2020 11:49 AM    LABGLOM 38 02/09/2022 08:49 AM    GLUCOSE 168 02/09/2022 08:49 AM    PROT 6.4 02/09/2022 08:49 AM    CALCIUM 9.9 02/09/2022 08:49 AM    BILITOT 0.2 02/09/2022 08:49 AM    ALKPHOS 125 02/09/2022 08:49 AM    ALKPHOS 134 08/21/2020 11:49 AM    AST 11 02/09/2022 08:49 AM    ALT 14 02/09/2022 08:49 AM       POC Tests: No results for input(s): POCGLU, POCNA, POCK, POCCL, POCBUN, POCHEMO, POCHCT in the last 72 hours.    Coags:   Lab Results   Component Value Date/Time    PROTIME 10.3 02/09/2022 08:49 AM    INR 1.0 02/09/2022 08:49 AM    APTT 26.8 02/09/2022 08:49 AM       HCG (If Applicable): No results found for: PREGTESTUR, PREGSERUM, HCG, HCGQUANT     ABGs: No results found for: PHART, PO2ART, PCO2ART, HCO3ART, BEART, O2SATART     Type & Screen (If Applicable):  No results found for: LABABO, LABRH    Drug/Infectious Status (If Applicable):  Lab Results    Component Value Date/Time    HEPCAB <0.1 02/23/2017 10:25 AM       COVID-19 Screening (If Applicable):   Lab Results   Component Value Date/Time    COVID19 Not Detected 12/13/2019 12:09 PM    COVID19 Please find results under separate order 12/13/2019 12:09 PM           Anesthesia Evaluation  Patient summary reviewed and Nursing notes reviewed  Airway: Mallampati: II          Dental: normal exam         Pulmonary:Negative Pulmonary ROS and normal exam  breath sounds clear to auscultation                             Cardiovascular:  Exercise tolerance: good (>4 METS),   (+) hypertension:, CAD: no interval change, CABG/stent: no interval change, hyperlipidemia      ECG reviewed  Rhythm: regular  Rate: normal  Echocardiogram reviewed         Beta Blocker:  Dose within 24 Hrs         Neuro/Psych:   Negative Neuro/Psych ROS              GI/Hepatic/Renal:   (+) GERD:, renal disease: CRI,           Endo/Other:    (+) DiabetesType II DM, using insulin, .          Pt had no PAT visit       Abdominal:             Vascular:   + PVD, aortic or cerebral, .       Other Findings:           Anesthesia Plan      general     ASA 3       Induction: intravenous.    MIPS: Prophylactic antiemetics administered.  Anesthetic plan and risks discussed with patient.    Use of blood products discussed with patient whom consented to  blood products.   Plan discussed with CRNA.                    Bernadette Hoit, DO   02/16/2022

## 2022-02-16 NOTE — Op Note (Signed)
[place]  OPERATIVE REPORT    Name:  Lindsey Huynh, Lindsey Huynh  MR#:  [id number]  DOB:  11/25/1950  ACCOUNT #:  [id number]  DATE OF SERVICE:  02/16/2022    PREOPERATIVE DIAGNOSIS:  Peripheral vascular disease with ischemic rest pain, right foot.    POSTOPERATIVE DIAGNOSIS:  Peripheral vascular disease with ischemic rest pain, right foot.    PROCEDURES PERFORMED:  1.  Right femoral to above-knee popliteal bypass with PTFE.  2.  Balloon angioplasty of right tibial and popliteal arteries.    SURGEON:  Belenda Cruise, MD    ASSISTANT:  None.    ANESTHESIA:  General.    COMPLICATIONS:  None.    SPECIMENS REMOVED:  None.    IMPLANTS:  A 7-mm Gore-Tex graft.    ESTIMATED BLOOD LOSS:  150 mL.    INDICATIONS:  The patient is a 71 year old female with peripheral vascular disease who has resting right foot pain.  She has superficial femoral artery occlusion and popliteal and tibial artery occlusive disease.  She will undergo bypass and angioplasty for revascularization.  She has previously had a right saphenous vein harvested for coronary bypass in the left saphenous vein for left lower extremity bypass.    PROCEDURE:  The patient's right leg was prepped and draped.  A transverse incision was made just above the inguinal crease.  The common femoral artery and femoral bifurcation were dissected free.  A longitudinal incision was made in the distal medial thigh.  The popliteal artery was dissected free.  The patient was heparinized.  A 7-mm thin-walled Gore-Tex graft was obtained.  The graft was sewn end-to-side to the common femoral artery at the bifurcation.  The graft was then clamped and tunneled subfascially from the groin to the distal medial thigh.  The graft was then sewn end-to-side to the popliteal artery using running CV5 Gore-Tex suture.  At the completion, there was a good Doppler signal in the popliteal artery distal to the bypass.  A 6-French sheath was then inserted into the graft.  An angiogram  was performed that showed a patent bypass with 60% stenosis in the popliteal artery  just distal to the bypass.  The popliteal artery was then of good size to the level of the knee joint.  Below the knee joint, it remained patent, but became significantly smaller.  The tibioperoneal trunk was severely stenotic.  The anterior tibial was occluded.  The peroneal and posterior tibial arteries were patent.  The peroneal artery was the best vessel filling the foot.  Using a wire and catheter, the popliteal artery was traversed, and the wire was passed through the tibioperoneal stenosis into the posterior tibial artery.  The proximal posterior tibial and tibioperoneal trunk were dilated with a 3-mm balloon.  The tibioperoneal trunk and the popliteal artery were then dilated with a 4-mm balloon.  Following this, there was a much improved angiographic appearance with continuous flow from the popliteal into both the posterior tibial and peroneal arteries.  The popliteal stenosis above the knee was then dilated with a 5-mm balloon.  Following this, there was minimal residual stenosis angiographically.  The sheath was then removed.  The puncture site in the graft was controlled with Gore-Tex suture.  There was continued good flow in the popliteal artery distal to the bypass by Doppler.  The incisions were then closed with Vicryl subcutaneous suture and skin staples.  Dressings were applied, and the patient was returned to the recovery room in stable condition.  Belenda Cruise, MD      [id number]  D:  02/16/2022 14:43  T:  02/17/2022 1:29  JOB #:  [id number]

## 2022-02-16 NOTE — Other (Signed)
Surgicel Fibrillar given to sterile field  Ref: 1963  Lot: [id number]  Exp: 07/19/2024

## 2022-02-17 LAB — BASIC METABOLIC PANEL
Anion Gap: 8 mmol/L (ref 5–15)
BUN/Creatinine Ratio: 25 — ABNORMAL HIGH (ref 12–20)
BUN: 28 MG/DL — ABNORMAL HIGH (ref 6–20)
CO2: 19 mmol/L — ABNORMAL LOW (ref 21–32)
Calcium: 9.4 MG/DL (ref 8.5–10.1)
Chloride: 114 mmol/L — ABNORMAL HIGH (ref 97–108)
Creatinine: 1.14 MG/DL — ABNORMAL HIGH (ref 0.55–1.02)
Est, Glom Filt Rate: 51 mL/min/{1.73_m2} — ABNORMAL LOW (ref >60)
Glucose: 141 mg/dL — ABNORMAL HIGH (ref 65–100)
Potassium: 4.3 mmol/L (ref 3.5–5.1)
Sodium: 141 mmol/L (ref 136–145)

## 2022-02-17 LAB — CBC
Hematocrit: 31.6 % — ABNORMAL LOW (ref 35.0–47.0)
Hemoglobin: 9.9 g/dL — ABNORMAL LOW (ref 11.5–16.0)
MCH: 31.6 PG (ref 26.0–34.0)
MCHC: 31.3 g/dL (ref 30.0–36.5)
MCV: 101 FL — ABNORMAL HIGH (ref 80.0–99.0)
MPV: 10.4 FL (ref 8.9–12.9)
Nucleated RBCs: 0 PER 100 WBC
Platelets: 230 10*3/uL (ref 150–400)
RBC: 3.13 M/uL — ABNORMAL LOW (ref 3.80–5.20)
RDW: 13.2 % (ref 11.5–14.5)
WBC: 8.2 10*3/uL (ref 3.6–11.0)
nRBC: 0 10*3/uL (ref 0.00–0.01)

## 2022-02-17 LAB — POCT GLUCOSE
POC Glucose: 164 mg/dL — ABNORMAL HIGH (ref 65–117)
POC Glucose: 112 mg/dL (ref 65–117)
POC Glucose: 125 mg/dL — ABNORMAL HIGH (ref 65–117)
POC Glucose: 186 mg/dL — ABNORMAL HIGH (ref 65–117)

## 2022-02-17 MED ORDER — ATORVASTATIN CALCIUM 20 MG PO TABS
20 MG | Freq: Every day | ORAL | Status: DC
Start: 2022-02-17 — End: 2022-02-18
  Administered 2022-02-17 – 2022-02-18 (×3): 20 mg via ORAL

## 2022-02-17 MED ORDER — METFORMIN HCL ER 500 MG PO TB24
500 MG | Freq: Every day | ORAL | Status: AC
Start: 2022-02-17 — End: 2022-02-18
  Administered 2022-02-17: 23:00:00 1000 mg via ORAL

## 2022-02-17 MED ORDER — GLUCAGON HCL RDNA (DIAGNOSTIC) 1 MG IJ SOLR
1 MG | INTRAMUSCULAR | Status: AC | PRN
Start: 2022-02-17 — End: 2022-02-18

## 2022-02-17 MED ORDER — ASPIRIN 81 MG PO TBEC
81 MG | Freq: Every day | ORAL | Status: AC
Start: 2022-02-17 — End: 2022-02-18
  Administered 2022-02-17 – 2022-02-18 (×3): 81 mg via ORAL

## 2022-02-17 MED ORDER — DEXTROSE 10 % IV BOLUS
INTRAVENOUS | Status: AC | PRN
Start: 2022-02-17 — End: 2022-02-18

## 2022-02-17 MED ORDER — CARVEDILOL 6.25 MG PO TABS
6.25 MG | Freq: Two times a day (BID) | ORAL | Status: AC
Start: 2022-02-17 — End: 2022-02-18
  Administered 2022-02-17 – 2022-02-18 (×4): 6.25 mg via ORAL

## 2022-02-17 MED ORDER — ENOXAPARIN SODIUM 40 MG/0.4ML IJ SOSY
40 MG/0.4ML | Freq: Every day | INTRAMUSCULAR | Status: AC
Start: 2022-02-17 — End: 2022-02-18
  Administered 2022-02-17 – 2022-02-18 (×3): 40 mg via SUBCUTANEOUS

## 2022-02-17 MED ORDER — EMPAGLIFLOZIN 10 MG PO TABS
10 MG | Freq: Every day | ORAL | Status: AC
Start: 2022-02-17 — End: 2022-02-18
  Administered 2022-02-17 – 2022-02-18 (×2): 10 mg via ORAL

## 2022-02-17 MED ORDER — DEXTROSE 10 % IV SOLN
10 % | INTRAVENOUS | Status: AC | PRN
Start: 2022-02-17 — End: 2022-02-18

## 2022-02-17 MED ORDER — GLUCOSE 4 G PO CHEW
4 g | ORAL | Status: AC | PRN
Start: 2022-02-17 — End: 2022-02-18

## 2022-02-17 MED ORDER — NORMAL SALINE FLUSH 0.9 % IV SOLN
0.9 % | Freq: Two times a day (BID) | INTRAVENOUS | Status: AC
Start: 2022-02-17 — End: 2022-02-18
  Administered 2022-02-17 – 2022-02-18 (×3): 10 mL via INTRAVENOUS

## 2022-02-17 MED ORDER — SODIUM CHLORIDE 0.9 % IV SOLN
0.9 % | INTRAVENOUS | Status: AC | PRN
Start: 2022-02-17 — End: 2022-02-18

## 2022-02-17 MED ORDER — BISACODYL 5 MG PO TBEC
5 MG | Freq: Every day | ORAL | Status: AC | PRN
Start: 2022-02-17 — End: 2022-02-18

## 2022-02-17 MED ORDER — PANTOPRAZOLE SODIUM 20 MG PO TBEC
20 MG | Freq: Every day | ORAL | Status: AC
Start: 2022-02-17 — End: 2022-02-18
  Administered 2022-02-17 – 2022-02-18 (×2): 20 mg via ORAL

## 2022-02-17 MED ORDER — SACUBITRIL-VALSARTAN 49-51 MG PO TABS
49-51 MG | Freq: Two times a day (BID) | ORAL | Status: AC
Start: 2022-02-17 — End: 2022-02-18
  Administered 2022-02-17 – 2022-02-18 (×4): 1 via ORAL

## 2022-02-17 MED ORDER — NORMAL SALINE FLUSH 0.9 % IV SOLN
0.9 % | INTRAVENOUS | Status: AC | PRN
Start: 2022-02-17 — End: 2022-02-18

## 2022-02-17 MED ORDER — ONDANSETRON 4 MG PO TBDP
4 MG | Freq: Three times a day (TID) | ORAL | Status: AC | PRN
Start: 2022-02-17 — End: 2022-02-18

## 2022-02-17 MED ORDER — ONDANSETRON HCL 4 MG/2ML IJ SOLN
4 MG/2ML | Freq: Four times a day (QID) | INTRAMUSCULAR | Status: AC | PRN
Start: 2022-02-17 — End: 2022-02-18

## 2022-02-17 MED ORDER — SPIRONOLACTONE 25 MG PO TABS
25 MG | Freq: Every day | ORAL | Status: AC
Start: 2022-02-17 — End: 2022-02-18
  Administered 2022-02-17 – 2022-02-18 (×3): 50 mg via ORAL

## 2022-02-17 MED ORDER — INSULIN LISPRO 100 UNIT/ML IJ SOLN
100 UNIT/ML | Freq: Every evening | INTRAMUSCULAR | Status: AC
Start: 2022-02-17 — End: 2022-02-18

## 2022-02-17 MED FILL — DEXTROSE 10 % IV SOLN: 10 % | INTRAVENOUS | Qty: 1000

## 2022-02-17 MED FILL — CARVEDILOL 6.25 MG PO TABS: 6.25 MG | ORAL | Qty: 1

## 2022-02-17 MED FILL — METFORMIN HCL ER 500 MG PO TB24: 500 MG | ORAL | Qty: 2

## 2022-02-17 MED FILL — SPIRONOLACTONE 25 MG PO TABS: 25 MG | ORAL | Qty: 2

## 2022-02-17 MED FILL — ENOXAPARIN SODIUM 40 MG/0.4ML IJ SOSY: 40 MG/0.4ML | INTRAMUSCULAR | Qty: 0.4

## 2022-02-17 MED FILL — NORMAL SALINE FLUSH 0.9 % IV SOLN: 0.9 % | INTRAVENOUS | Qty: 40

## 2022-02-17 MED FILL — MORPHINE SULFATE (PF) 4 MG/ML IV SOLN: 4 MG/ML | INTRAVENOUS | Qty: 1

## 2022-02-17 MED FILL — MORPHINE SULFATE (PF) 4 MG/ML IV SOLN: 4 mg/mL | INTRAVENOUS | Qty: 1 | Fill #0

## 2022-02-17 MED FILL — ENTRESTO 49-51 MG PO TABS: 49-51 MG | ORAL | Qty: 1

## 2022-02-17 MED FILL — MORPHINE SULFATE (PF) 4 MG/ML IV SOLN: 4 mg/mL | INTRAVENOUS | Qty: 1

## 2022-02-17 MED FILL — LIPITOR 20 MG PO TABS: 20 MG | ORAL | Qty: 1

## 2022-02-17 MED FILL — ASPIRIN LOW DOSE 81 MG PO TBEC: 81 MG | ORAL | Qty: 1

## 2022-02-17 NOTE — Progress Notes (Signed)
Vascular:    Awake, alert, foot pain is better but not resolved    Leg dressings intact, palpable popliteal pulse, good signal in PT    Stable POD #1 post fem pop bypass - DC Foley, ambulate as tolerated

## 2022-02-17 NOTE — Care Coordination-Inpatient (Addendum)
Care Management Initial Assessment       RUR:13%  Readmission? No  1st IM letter given? yes  1st [employer] letter given: no   02/17/22 1505   Service Assessment   Patient Orientation Alert and Oriented   Cognition Alert   History Provided By Patient   Lindsey Huynh   PCP Verified by CM Yes   Last Visit to PCP Within last 3 months   Prior Functional Level Independent in ADLs/IADLs   Current Functional Level Independent in ADLs/IADLs   Can patient return to prior living arrangement Yes   Ability to make needs known: Good   Social/Functional History   Lives With Lindsey Huynh One level   Home Access Stairs to enter with rails     Chart reviewed.  Patient admitted here on 02/16/22 from home.   The patient lives alone in a 1-story residence.   The patient as admitted for procedure (right femoral popliteal bypass with PTFE.The patient has a past medical hx of  DM, CAD, HTN, PAD, CAD, GERD, CHF Hypercholesterolemia and Arthritis.    Therapy team is recommending HH PT/OT. Per IDR, EDD Sat 02/19/22.    CM met with the patient to introduce self and role. Demographics verified.  Prior SNF: [facility]  SNF rehab following hospitalization 11/23/19-12/12/19  Previous IPR: none  Previous HH: yes following her heart surgery at [facility]  Name of agency is unknown to the patient.   Pharm: [facility] [address] and  [employer]  Outpatient Rehab:  [facility] (2019)    Patient approves of any hh agency that is par with her insurance.     CM continuing to follow.     Tillman Abide, [place], [place]

## 2022-02-17 NOTE — Plan of Care (Addendum)
2000 Received Pt from [place], RN      0730 Bedside and Verbal Shift change report given to Junie Panning, Therapist, sports (oncoming nurse) by Glean Salvo, RN. Report included the following information SBAR, Kardex, MAR, Recent Results, and Cardiac Rhythm NSR-Sinus Loletha Grayer.    Problem: Chronic Conditions and Co-morbidities  Goal: Patient's chronic conditions and co-morbidity symptoms are monitored and maintained or improved  Outcome: Progressing  Flowsheets (Taken 02/17/2022 2045)  Care Plan - Patient's Chronic Conditions and Co-Morbidity Symptoms are Monitored and Maintained or Improved: Monitor and assess patient's chronic conditions and comorbid symptoms for stability, deterioration, or improvement     Problem: Pain  Goal: Verbalizes/displays adequate comfort level or baseline comfort level  Outcome: Progressing  Flowsheets (Taken 02/17/2022 2045)  Verbalizes/displays adequate comfort level or baseline comfort level: Encourage patient to monitor pain and request assistance     Problem: Safety - Adult  Goal: Free from fall injury  Outcome: Progressing  Flowsheets (Taken 02/17/2022 2250)  Free From Fall Injury: Instruct family/caregiver on patient safety     Problem: Discharge Planning  Goal: Discharge to home or other facility with appropriate resources  Outcome: Progressing  Flowsheets (Taken 02/17/2022 2045)  Discharge to home or other facility with appropriate resources: Identify barriers to discharge with patient and caregiver

## 2022-02-17 NOTE — Progress Notes (Signed)
Bedside and Verbal shift change report given to Tammy RN (oncoming nurse) by Beckie Busing RN (offgoing nurse). Report included the following information Nurse Handoff Report, Index, Surgery Report, Intake/Output, MAR, Recent Results, and Cardiac Rhythm NSR    On pain management as she still complains of pain.   Post operative site dry and dressing intact.  Bed not working properly, morning nurse made aware.

## 2022-02-17 NOTE — Plan of Care (Signed)
Problem: Physical Therapy - Adult  Goal: By Discharge: Performs mobility at highest level of function for planned discharge setting.  See evaluation for individualized goals.  Description: FUNCTIONAL STATUS PRIOR TO ADMISSION: Patient was modified independent using a single point cane for functional mobility. Denies hx falls. Drives. Lives alone but has supportive daughter in area.     HOME SUPPORT PRIOR TO ADMISSION: The patient lived alone with local daughter to provide assistance.    Physical Therapy Goals  Initiated 02/17/2022  1.  Patient will move from supine to sit and sit to supine in bed with supervision/set-up within 7 day(s).    2.  Patient will perform sit to stand with contact guard assist within 7 day(s).  3.  Patient will transfer from bed to chair and chair to bed with supervision/set-up using the least restrictive device within 7 day(s).  4.  Patient will ambulate with contact guard assist for 100 feet with the least restrictive device within 7 day(s).   5.  Patient will ascend/descend 2 stairs with bilateral handrail(s) with contact guard assist within 7 day(s).      Outcome: Progressing   PHYSICAL THERAPY EVALUATION    Patient: Lindsey Huynh (71 y.o. female)  Date: 02/17/2022  Primary Diagnosis: Peripheral vascular disease ([place]) [I73.9]  PVD (peripheral vascular disease) (HCC) [I73.9]  Procedure(s) (LRB):  RIGHT FEMORAL POPLITEAL BYPASS WITH PTFE, ANGIOPLASTY OF POPLITEAL AND TIBIAL ARTERIES (Right) 1 Day Post-Op   Precautions: Fall Risk                    ASSESSMENT :   DEFICITS/IMPAIRMENTS:   The patient is limited by decreased functional mobility, independence in ADLs, strength, sensation, activity tolerance, balance, increased pain levels, and increased risk for falls now POD# 1 s/p R fem-pop bypass 2/2 PVD. Patient found supine in bed and agreeable to PT. Overall min A x 1 for bed mobility for management of R LE, min A x 1 for sit <> stand, and CGA level for ambulation of 15 feet with RW.  Patient provided with cues on LE sequencing to improve weight shifting and pain control, and demonstrated good weight acceptance through R LE despite report of 8/10 pain. Further ambulation limited by pain and fatigue. All VSS with position change and activity. Patient left seated in chair with LEs reclined and pillow supporting R LE for comfort. Anticipate patient will progress well and be able to return home with [facility] PT and family support.      02/17/22 0945 02/17/22 0956 02/17/22 1010   Vital Signs   Pulse 66 84 65   BP 120/72 (!) 138/59 (!) 155/78   MAP (Calculated) 88 85 104   Patient Position Semi fowlers Sitting  (EOB) Standing  (post-gait)   Oxygen Therapy   SpO2 94 % 95 % 96 %   O2 Device None (Room air) None (Room air) None (Room air)     Patient will benefit from skilled intervention to address the above impairments.    Functional Outcome Measure:  The patient scored 18/24 on the AMPAC outcome measure with Cutoff score ?171,2,3 had higher odds of discharging home with home health or need of SNF/IPR.       PLAN :  Recommendations and Planned Interventions:   bed mobility training, transfer training, gait training, therapeutic exercises, neuromuscular re-education, patient and family training/education, and therapeutic activities    Frequency/Duration: Patient will be followed by physical therapy to address goals, PT Plan of Care: 5 times/week  to address goals.    Recommendation for discharge: (in order for the patient to meet his/her long term goals): Therapy 2 days/week in the home and assist for safety    Other factors to consider for discharge: lives alone and concern for safely navigating or managing the home environment    IF patient discharges home will need the following DME: patient owns DME required for discharge       SUBJECTIVE:   Patient stated "Ooo this is sore."    OBJECTIVE DATA SUMMARY:       Past Medical History:   Diagnosis Date    Arthritis     CAD (coronary artery disease)     CHF  (congestive heart failure) (HCC)     Diabetes ([place])     insulin + metformin TYPE 2    GERD (gastroesophageal reflux disease)     Hypercholesterolemia     Hypertension     PAD (peripheral artery disease) ([place])      Past Surgical History:   Procedure Laterality Date    COLONOSCOPY  2019    CORONARY ARTERY BYPASS GRAFT  12/25/2020    x2, [facility]     CORONARY ARTERY BYPASS GRAFT  12/25/2020    EYE SURGERY Right     CATARACT REMOVAL    FEMORAL BYPASS Left 11/28/2019     Dr Timmothy Sours    FEMORAL BYPASS Right 02/16/2022    RIGHT FEMORAL POPLITEAL BYPASS WITH PTFE, ANGIOPLASTY OF POPLITEAL AND TIBIAL ARTERIES performed by Belenda Cruise, MD at [place] Left 12/05/2019    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 Duwaine Maxin    HYSTERECTOMY ([place])  [place]  06-30-10    back surgery (tumor removed)    TUMOR REMOVAL  06/30/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       Home Situation:  Social/Functional History  Lives With: Alone  Type of Home: Apartment  Home Layout: One level  Home Access: Stairs to enter with rails  Entrance Stairs - Number of Steps: 2  Entrance Stairs - Rails: Both  Bathroom Shower/Tub: Engineer, materials: Engineer, site: Radio producer, Radiation protection practitioner, Environmental consultant, rolling  Has the patient had two or more falls in the past year or any fall with injury in the past year?: No  ADL Assistance: Independent  Homemaking Assistance: Independent  Ambulation Assistance: Independent  Transfer Assistance: Independent  Active Driver: Yes  Mode of Transportation: Recruitment consultant Status:  Orientation  Overall Orientation Status: Within Normal Limits  Orientation Level: Oriented X4  Cognition  Overall Cognitive Status: WNL    Skin: intact    Edema: none noted    Hearing:        Vision/Perceptual:                  Strength:    Strength: Generally decreased, functional    Tone & Sensation:   Tone: Normal  Sensation: Impaired (slightly decreased sensation  toes of R foot, which patient states is improving post-op)    Coordination:  Coordination: Within functional limits    Range Of Motion:  AROM: Generally decreased, functional       Functional Mobility:  Bed Mobility:     Bed Mobility Training  Bed Mobility Training: Yes  Interventions: Safety awareness training;Verbal cues  Supine to Sit: Minimum assistance;Assist X1;Additional time (for R LE management only)  Scooting: Stand-by  assistance  Transfers:     Pharmacologist: Yes  Overall Level of Assistance: Minimum assistance;Assist X1  Interventions: Verbal cues;Safety awareness training  Sit to Stand: Minimum assistance;Assist X1  Stand to Sit: Minimum assistance;Assist X1  Bed to Chair: Contact-guard assistance;Additional time  Balance:               Balance  Sitting: Intact  Standing: Impaired  Standing - Static: Good;Constant support  Standing - Dynamic: Fair;Good;Constant support  Ambulation/Gait Training:                       Gait  Overall Level of Assistance: Contact-guard assistance  Interventions: Verbal cues;Safety awareness training  Base of Support: Shift to left  Speed/Cadence: Slow;Shuffled  Step Length: Right shortened;Left shortened  Swing Pattern: Right asymmetrical  Stance: Right decreased;Left increased  Gait Abnormalities: Antalgic;Decreased step clearance  Distance (ft): 15 Feet  Assistive Device: Walker, rolling;Gait belt                                                                                                                                                                                                                                                          [employer] [facility]	[place] AM-PAC      Basic Mobility Inpatient Short Form (6-Clicks) Version 2    AM-PAC Basic Mobility - Inpatient   How much help is needed turning from your back to your side while in a flat bed without using bedrails?: A Little  How much help is needed moving from lying on your back to sitting on  the side of a flat bed without using bedrails?: A Little  How much help is needed moving to and from a bed to a chair?: A Little  How much help is needed standing up from a chair using your arms?: A Little  How much help is needed walking in hospital room?: None  How much help is needed climbing 3-5 steps with a railing?: A Lot  AM-PAC Inpatient Mobility Raw Score : 18  AM-PAC Inpatient T-Scale Score : 43.63  Mobility Inpatient CMS 0-100% Score: 46.58  Mobility Inpatient CMS G-Code Modifier : CK         Cutoff score ?171,2,3 had higher odds of discharging home with home health or need of  SNF/IPR.    [place], Jon Gills, Vinoth Debbe Bales, Mena Goes Passek, Roslynn Amble. Annabell Howells.  Validity of the AM-PAC "6-Clicks" Inpatient Daily Activity and Basic Mobility Short Forms. Physical Therapy Mar 2014, 94 (3) 379-391; DOI: 10.2522/ptj.20130199  2. Raynelle Chary. Association of AM-PAC "6-Clicks" Basic Mobility and Daily Activity Scores With Discharge Destination. Phys Ther. 2021 Apr 4;101(4):pzab043. doi: 10.1093/ptj/pzab043. PMID: 32202542.  [place], Rajaraman D, Tanna Furry, Agayby K, Sherman S. Activity Measure for Post-Acute Care "6-Clicks" Basic Mobility Scores Predict Discharge Destination After Acute Care Hospitalization in Select Patient Groups: A Retrospective, Observational Study. Arch Rehabil Res Clin Transl. 2022 Jul 16;4(3):100204. doi: 10.1016/j.arrct.7062.376283. PMID: 15176160; PMCID: VPX1062694.  4. Vonzell Schlatter, Coster W, Ni P. AM-PAC Short Forms Manual 4.0. Revised 10/2018.                                                                                                                                                                                                                               Pain Rating:  8/10 R LE pain   Pain Intervention(s):   nursing notified    Activity Tolerance:   Good, SpO2 stable on room air, and limited by pain      After treatment:   Patient left in no apparent distress sitting up in chair, Call bell within reach, and Bed/ chair alarm activated    COMMUNICATION/EDUCATION:   The patient's plan of care was discussed with: registered nurse    Patient Education  Education Given To: Patient  Education Provided: Role of Therapy;Plan of Care;Energy Conservation;Fall Prevention Strategies;Precautions;Transfer Training;Equipment  Education Method: Verbal;Demonstration  Barriers to Learning: None  Education Outcome: Verbalized understanding    Thank you for this referral.  Braulio Bosch, PT, DPT  Minutes: 30      Physical Therapy Evaluation Charge Determination   History Examination Presentation Decision-Making   MEDIUM  Complexity : 1-2 comorbidities / personal factors will impact the outcome/ POC  LOW Complexity : 1-2 Standardized tests and measures addressing body structure, function, activity limitation and / or participation in recreation  LOW Complexity : Stable, uncomplicated  AM-PAC  MEDIUM   Based on the above components, the patient evaluation is determined to be of the following complexity level: Low

## 2022-02-17 NOTE — Progress Notes (Signed)
Orders received, chart reviewed and patient evaluated by physical therapy. Pending progression with skilled acute physical therapy, recommend:  Therapy 2 days/week in the home and assist for safety    Recommend with nursing OOB to chair 3x/day and walking daily with minimal-contact guard assist and rolling walker. Thank you for completing as able in order to maintain patient strength, endurance and independence.     Full evaluation to follow.   Braulio Bosch, PT, DPT

## 2022-02-18 LAB — POCT GLUCOSE
POC Glucose: 184 mg/dL — ABNORMAL HIGH (ref 65–117)
POC Glucose: 124 mg/dL — ABNORMAL HIGH (ref 65–117)
POC Glucose: 170 mg/dL — ABNORMAL HIGH (ref 65–117)

## 2022-02-18 MED ORDER — HYDROCODONE-ACETAMINOPHEN 7.5-325 MG PO TABS
ORAL_TABLET | Freq: Four times a day (QID) | ORAL | 0 refills | Status: AC | PRN
Start: 2022-02-18 — End: 2022-02-23

## 2022-02-18 NOTE — Plan of Care (Signed)
Problem: Physical Therapy - Adult  Goal: By Discharge: Performs mobility at highest level of function for planned discharge setting.  See evaluation for individualized goals.  Description: FUNCTIONAL STATUS PRIOR TO ADMISSION: Patient was modified independent using a single point cane for functional mobility. Denies hx falls. Drives. Lives alone but has supportive daughter in area.     HOME SUPPORT PRIOR TO ADMISSION: The patient lived alone with local daughter to provide assistance.    Physical Therapy Goals  Initiated 02/17/2022  1.  Patient will move from supine to sit and sit to supine in bed with supervision/set-up within 7 day(s).    2.  Patient will perform sit to stand with contact guard assist within 7 day(s).  3.  Patient will transfer from bed to chair and chair to bed with supervision/set-up using the least restrictive device within 7 day(s).  4.  Patient will ambulate with contact guard assist for 100 feet with the least restrictive device within 7 day(s).   5.  Patient will ascend/descend 2 stairs with bilateral handrail(s) with contact guard assist within 7 day(s).      02/18/2022 1047 by Braulio Bosch, PT  Outcome: Progressing

## 2022-02-18 NOTE — Discharge Instructions (Signed)
Remove leg dressings on Sat or Sun and leave open unless draining, OK to shower on Sun    Elevate leg for swelling    Follow up 2 weeks with Dr Timmothy Sours - call [phone] for appt

## 2022-02-18 NOTE — Progress Notes (Signed)
I agree with all of Glean Salvo, RN charting as she has been orienting with me for the last 12 hrs.

## 2022-02-18 NOTE — Discharge Summary (Signed)
[place] SUMMARY    Name:  Lindsey Huynh, Lindsey Huynh  MR#:  [id number]  DOB:  [birth year]  ACCOUNT #:  [id number]  ADMIT DATE:  02/16/2022  DISCHARGE DATE:  02/18/2022      FINAL DIAGNOSIS:  Peripheral vascular disease with ischemic rest pain, right foot.    PROCEDURE:  Right femoral above-knee popliteal bypass with PTFE and balloon angioplasty of right tibial and popliteal arteries.    HISTORY:  The patient is a 72 year old female with known peripheral vascular disease who has had a previous left femoral peroneal bypass and now has ischemic rest pain involving the right foot.  She has no pulses below the right femoral level.  Angiography shows superficial femoral artery occlusion with popliteal and tibial stenosis distal to this.  She has had previous saphenous vein harvest from the right leg for coronary bypass.  She was admitted for revascularization.    HOSPITAL COURSE:  The patient was admitted and taken to the operating room where she underwent the above listed procedure.  Postoperative course was uncomplicated.  Her foot pain improved significantly following the procedure.  She was discharged home on the second postop day in stable clinical condition.  At that time, her incisions are healing well and her bypass is patent with a warm foot and a palpable popliteal pulse.  She is discharged on a cardiac diet, activity as tolerated, and her usual medications with the addition of Norco for pain and follow up in my office in 2 weeks.    DISPOSITION:  Home.      Belenda Cruise, MD      [id number]  D:  02/24/2022 7:19  T:  02/24/2022 10:02  JOB #:  [id number]

## 2022-02-18 NOTE — Progress Notes (Signed)
[phone]- Care of patient assumed. Handoff report received from Hosie Poisson and Janett Billow, Therapist, sports. Opportunity to ask questions provided.    0900- Discharge order is in place. Patient is waiting for daughter to arrive to transport home. Discharge paperwork reviewed with patient. Patient given the opportunity to ask questions if needed. Patient given a copy of discharge paperwork.    1200- IV has been discontinued. A final set of vital signs have been recorded. Patient is in room with daughter. All belongings given to patient and daughter. Patient wheeled downstairs to car.

## 2022-02-18 NOTE — Progress Notes (Signed)
Vascular:    Doing well - foot pain better    Foot warm- dressings intact    Home today

## 2022-02-18 NOTE — Progress Notes (Signed)
Physician Progress Note      PATIENT:               Lindsey Huynh, Lindsey Huynh  CSN #:                  [id number]  DOB:                       [birth year]  ADMIT DATE:       02/16/2022 9:53 AM  DISCH DATE:  Earnstine Regal  PROVIDER #:        Belenda Cruise MD          QUERY TEXT:    Pt admitted with Peripheral vascular disease. Pt noted to have abnormal serum   CO2 level. If possible, please document in the progress notes and discharge   summary if you are evaluating and/or treating any of the following:    The medical record reflects the following:  Risk Factors: postop  Clinical Indicators:  02/17/22 03:35  CO2: 19 (L)    Treatment: NS @ 75 cc/hr    Thank you,  Caprice Kluver RN, CCDS, [facility]  Certified Clinical Documentation Integrity Specialist  [phone]  you can also reach me by perfect serve.  Options provided:  -- Metabolic Acidosis  -- Respiratory Acidosis  -- abnormal CO2 level not clinically significant  -- Other - I will add my own diagnosis  -- Disagree - Not applicable / Not valid  -- Disagree - Clinically unable to determine / Unknown  -- Refer to Clinical Documentation Reviewer    PROVIDER RESPONSE TEXT:    abnormal CO2 level not clinically significant.    Query created by: Felipa Eth on 02/17/2022 10:47 AM      QUERY TEXT:    Pt admitted with Peripheral vascular disease. Pt noted to have diabetes. If   possible, please document in progress notes and discharge summary the   relationship, if any, between Peripheral vascular disease and diabetes.    The medical record reflects the following:  Risk Factors: diabetes  Clinical Indicators:  Peripheral vascular disease    02/16/22 20:52  POC Glucose: 164 (H)    HX of type 2 diabetes    Treatment: lispro sliding scale, POC glucose monitoring,   Right femoral to   above-knee popliteal bypass    Thank you,  Caprice Kluver RN, CCDS, [facility]  Certified Clinical Documentation Integrity Specialist  [phone]  you can also reach me by perfect serve.  Options  provided:  -- Peripheral vascular disease related to diabetes  -- Peripheral vascular disease unrelated to diabetes  -- Other - I will add my own diagnosis  -- Disagree - Not applicable / Not valid  -- Disagree - Clinically unable to determine / Unknown  -- Refer to Clinical Documentation Reviewer    PROVIDER RESPONSE TEXT:    This patient has Peripheral vascular disease related to diabetes.    Query created by: Felipa Eth on 02/17/2022 10:52 AM      Electronically signed by:  Belenda Cruise MD 02/18/2022 7:35 AM

## 2022-02-18 NOTE — Progress Notes (Incomplete)
02/18/22 0823 02/18/22 0833   Vital Signs   Pulse 70 90   BP (!) 130/54 (!) 135/54   MAP (Calculated) 79 81   Patient Position Sitting  (pre-gait) Sitting;Up in chair  (post-gait)

## 2022-02-18 NOTE — Plan of Care (Signed)
Problem: Physical Therapy - Adult  Goal: By Discharge: Performs mobility at highest level of function for planned discharge setting.  See evaluation for individualized goals.  Description: FUNCTIONAL STATUS PRIOR TO ADMISSION: Patient was modified independent using a single point cane for functional mobility. Denies hx falls. Drives. Lives alone but has supportive daughter in area.     HOME SUPPORT PRIOR TO ADMISSION: The patient lived alone with local daughter to provide assistance.    Physical Therapy Goals  Initiated 02/17/2022  1.  Patient will move from supine to sit and sit to supine in bed with supervision/set-up within 7 day(s).    2.  Patient will perform sit to stand with contact guard assist within 7 day(s).  3.  Patient will transfer from bed to chair and chair to bed with supervision/set-up using the least restrictive device within 7 day(s).  4.  Patient will ambulate with contact guard assist for 100 feet with the least restrictive device within 7 day(s).   5.  Patient will ascend/descend 2 stairs with bilateral handrail(s) with contact guard assist within 7 day(s).      Outcome: Progressing  PHYSICAL THERAPY TREATMENT/DISCHARGE    Patient: Lindsey Huynh (71 y.o. female)  Date: 02/18/2022  Diagnosis: Peripheral vascular disease ([place]) [I73.9]  PVD (peripheral vascular disease) ([place]) [I73.9] PVD (peripheral vascular disease) ([place])  Procedure(s) (LRB):  RIGHT FEMORAL POPLITEAL BYPASS WITH PTFE, ANGIOPLASTY OF POPLITEAL AND TIBIAL ARTERIES (Right) 2 Days Post-Op  Precautions: Fall Risk                    ASSESSMENT:  Patient has been followed by skilled PT services and has progressed towards goals. Patient found supine in bed and agreeable to PT. Patient demonstrating much improved pain and need for substantially less assistance today, and was able to progress gait and stair training. Patient overall supervision level for bed mobility, sit <> stand transfers, and ambulation of 20 feet x 2 to/from  stairwell, where she completed 2 steps with L rail and min A x 1 via R HHA with good tolerance. Per patient, assist available from patient's daughter at home. At this time patient is cleared to D/C  home from PT perspective, recommend [facility] PT and family assist as needed.        PLAN:  Maximum therapeutic benefit has been met at current level of care and patient will be discharged from physical therapy at this time.    Rationale for discharge:  Goals achieved    Recommendation for discharge: (in order for the patient to meet his/her long term goals): Therapy 2 days/week in the home and assist for safety    Other factors to consider for discharge: lives alone    IF patient discharges home will need the following DME: patient owns DME required for discharge     SUBJECTIVE:   Patient stated, "It feels much better."    OBJECTIVE DATA SUMMARY:   Critical Behavior:          Functional Mobility Training:  Bed Mobility:  Bed Mobility Training  Bed Mobility Training: Yes  Overall Level of Assistance: Independent  Supine to Sit: Independent  Scooting: Independent  Transfers:  Pharmacologist: Yes  Overall Level of Assistance: Supervision  Sit to Stand: Supervision  Stand to Sit: Supervision  Bed to Chair: Supervision  Balance:  Balance  Sitting: Intact  Standing: Intact  Standing - Static: Good;Constant support  Standing - Dynamic: Good;Constant support   Ambulation/Gait  Training:     Gait  Overall Level of Assistance: Supervision  Base of Support: Shift to left  Speed/Cadence: Pace decreased (< 100 feet/min)  Step Length: Right shortened;Left shortened  Swing Pattern: Right asymmetrical  Stance: Right decreased;Left increased  Gait Abnormalities: Decreased step clearance  Distance (ft): 20 Feet (x2)  Assistive Device: Walker, rolling;Gait belt  Rail Use: Left (R HHA)  Stairs - Level of Assistance: Minimum assistance;Assist X1 (via HHA)  Number of Stairs Trained: 2                Pain Rating:  0-5/10 R LE  pain with activity    Pain Intervention(s):   nursing notified    Activity Tolerance:   Good and SpO2 stable on room air    After treatment:   Patient left in no apparent distress sitting up in chair, Call bell within reach, and Bed/ chair alarm activated      COMMUNICATION/EDUCATION:   The patient's plan of care was discussed with: registered nurse    Patient Education  Education Given To: Patient  Education Provided: Role of Therapy;Plan of Care;Energy Conservation;Fall Prevention Strategies;Precautions;Transfer Training;Equipment  Education Provided Comments: stair training  Education Method: Verbal;Demonstration  Barriers to Learning: None  Education Outcome: Verbalized understanding      Braulio Bosch, PT, DPT  Minutes: 12

## 2022-02-22 NOTE — Telephone Encounter (Signed)
Care Transitions Initial Follow Up Call    Outreach made within 2 business days of discharge: Yes    Patient: Lindsey Huynh Patient DOB: 01-21-1951   MRN: [id number]  Reason for Admission: There are no discharge diagnoses documented for the most recent discharge.  Discharge Date: 02/18/22       Spoke with: Lyn Hollingshead      Discharge department/facility: 02/18/22    TCM Interactive Patient Contact:  Was patient able to fill all prescriptions: Yes  Was patient instructed to bring all medications to the follow-up visit: Yes  Is patient taking all medications as directed in the discharge summary? Yes  Does patient understand their discharge instructions: Yes  Does patient have questions or concerns that need addressed prior to 7-14 day follow up office visit: no    Scheduled appointment with PCP within 7-14 days    Follow Up  Future Appointments   Date Time Provider [place]   03/01/2022 11:30 AM Bobbie Stack, APRN - NP [facility]       Elsie Ra, LPN

## 2022-03-01 ENCOUNTER — Ambulatory Visit: Admit: 2022-03-01 | Discharge: 2022-03-18 | Payer: MEDICARE | Attending: Family | Primary: Family

## 2022-03-01 DIAGNOSIS — E1121 Type 2 diabetes mellitus with diabetic nephropathy: Secondary | ICD-10-CM

## 2022-03-01 DIAGNOSIS — I739 Peripheral vascular disease, unspecified: Secondary | ICD-10-CM

## 2022-03-01 DIAGNOSIS — R8290 Unspecified abnormal findings in urine: Secondary | ICD-10-CM

## 2022-03-01 LAB — AMB POC URINE, MICROALBUMIN, SEMIQUANT (3 RESULTS)
Albumin, Urine POC: 30 mg/L
Creatinine, Urine, POC: 100 mg/dL
Microalb/Creat Ratio POC: <30 MG/G (ref <30)

## 2022-03-01 MED ORDER — ATORVASTATIN CALCIUM 20 MG PO TABS
20 MG | ORAL_TABLET | Freq: Every day | ORAL | 3 refills | Status: DC
Start: 2022-03-01 — End: 2022-10-05

## 2022-03-01 NOTE — Progress Notes (Unsigned)
Rm: 09    Chief Complaint   Patient presents with    Follow-Up from [facility] hospital        1. Have you been to the ER, urgent care clinic since your last visit?  Hospitalized since your last visit?yes, [facility]    2. Have you seen or consulted any other health care providers outside of the [facility] since your last visit?  Include any pap smears or colon screening. yes, Heart Doctor, Banker , Foot Doctor         Social Determinants of Health     Tobacco Use: Medium Risk    Smoking Tobacco Use: Former    Smokeless Tobacco Use: Never    Passive Exposure: Current   Alcohol Use: Not on Programmer, applications Strain: Not on file   Food Insecurity: Not on file   Transportation Needs: Not on file   Physical Activity: Not on file   Stress: Not on file   Social Connections: Not on file   Intimate Partner Violence: Not on file   Depression: Not at risk    PHQ-2 Score: 0   Housing Stability: Not on file

## 2022-03-01 NOTE — Progress Notes (Incomplete)
Subjective:      Patient ID: Lindsey Huynh is a 71 y.o. female.    HPI    Believes vascualr prblems d/t vein harvested for cardiac surgery in April     Podiatrist recommended seeing Vascualr  surgery   Toes not longer hurt     Tomorrow will have staples removed     Eye exam past due     Mammogram past due     Had colonosocpy in 2017 or 2016    Follow up with Podiatry newxt       Review of Systems    Objective:   Physical Exam    Assessment:      ***      Plan:      ***        Carolan Shiver, APRN - NP

## 2022-03-02 LAB — COMPREHENSIVE METABOLIC PANEL
ALT: 14 U/L (ref 12–78)
AST: 7 U/L — ABNORMAL LOW (ref 15–37)
Albumin/Globulin Ratio: 1.3 (ref 1.1–2.2)
Albumin: 3.8 g/dL (ref 3.5–5.0)
Alk Phosphatase: 130 U/L — ABNORMAL HIGH (ref 45–117)
Anion Gap: 8 mmol/L (ref 5–15)
BUN: 48 MG/DL — ABNORMAL HIGH (ref 6–20)
Bun/Cre Ratio: 32 — ABNORMAL HIGH (ref 12–20)
CO2: 23 mmol/L (ref 21–32)
Calcium: 10.4 MG/DL — ABNORMAL HIGH (ref 8.5–10.1)
Chloride: 105 mmol/L (ref 97–108)
Creatinine: 1.5 MG/DL — ABNORMAL HIGH (ref 0.55–1.02)
Est, Glom Filt Rate: 37 mL/min/{1.73_m2} — ABNORMAL LOW (ref >60)
Globulin: 2.9 g/dL (ref 2.0–4.0)
Glucose: 173 mg/dL — ABNORMAL HIGH (ref 65–100)
Potassium: 5.2 mmol/L — ABNORMAL HIGH (ref 3.5–5.1)
Sodium: 136 mmol/L (ref 136–145)
Total Bilirubin: 0.2 MG/DL (ref 0.2–1.0)
Total Protein: 6.7 g/dL (ref 6.4–8.2)

## 2022-03-02 LAB — CBC WITH AUTO DIFFERENTIAL
Absolute Immature Granulocyte: 0.1 10*3/uL — ABNORMAL HIGH (ref 0.00–0.04)
Basophils %: 1 % (ref 0–1)
Basophils Absolute: 0.1 10*3/uL (ref 0.0–0.1)
Eosinophils %: 4 % (ref 0–7)
Eosinophils Absolute: 0.2 10*3/uL (ref 0.0–0.4)
Hematocrit: 31.5 % — ABNORMAL LOW (ref 35.0–47.0)
Hemoglobin: 9.7 g/dL — ABNORMAL LOW (ref 11.5–16.0)
Immature Granulocytes: 2 % — ABNORMAL HIGH (ref 0.0–0.5)
Lymphocytes %: 20 % (ref 12–49)
Lymphocytes Absolute: 1.3 10*3/uL (ref 0.8–3.5)
MCH: 31.7 PG (ref 26.0–34.0)
MCHC: 30.8 g/dL (ref 30.0–36.5)
MCV: 102.9 FL — ABNORMAL HIGH (ref 80.0–99.0)
MPV: 11.4 FL (ref 8.9–12.9)
Monocytes %: 8 % (ref 5–13)
Monocytes Absolute: 0.5 10*3/uL (ref 0.0–1.0)
Neutrophils %: 65 % (ref 32–75)
Neutrophils Absolute: 4.2 10*3/uL (ref 1.8–8.0)
Nucleated RBCs: 0 PER 100 WBC
Platelets: 350 10*3/uL (ref 150–400)
RBC: 3.06 M/uL — ABNORMAL LOW (ref 3.80–5.20)
RDW: 13.1 % (ref 11.5–14.5)
WBC: 6.4 10*3/uL (ref 3.6–11.0)
nRBC: 0 10*3/uL (ref 0.00–0.01)

## 2022-03-02 LAB — FERRITIN: Ferritin: 125 NG/ML (ref 8–252)

## 2022-03-02 LAB — IRON AND TIBC
Iron % Saturation: 17 % — ABNORMAL LOW (ref 20–50)
Iron: 51 ug/dL (ref 35–150)
TIBC: 297 ug/dL (ref 250–450)

## 2022-03-02 LAB — VITAMIN B12: Vitamin B-12: 388 pg/mL (ref 193–986)

## 2022-03-03 LAB — CULTURE, URINE: Colony count: 20000

## 2022-03-22 NOTE — Telephone Encounter (Signed)
Future Appointments:  No future appointments.     Last Appointment With Me:  03/01/2022     Requested Prescriptions     Pending Prescriptions Disp Refills    spironolactone (ALDACTONE) 50 MG tablet [Pharmacy Med Name: SPIRONOLACTONE '50MG'$  TABLETS] 90 tablet      Sig: TAKE 1 TABLET BY MOUTH DAILY

## 2022-03-23 MED ORDER — SPIRONOLACTONE 50 MG PO TABS
50 MG | ORAL_TABLET | ORAL | 3 refills | Status: DC
Start: 2022-03-23 — End: 2024-01-03

## 2022-03-24 NOTE — Telephone Encounter (Addendum)
Faxed over last office notes and last labs to Dr Iona Beard with [facility] and waiting for a fax confirmation.

## 2022-04-06 NOTE — Telephone Encounter (Signed)
Summary: 4 Wk F/U Appt    Formatting of this note might be different from the original.  Dr. Iona Beard is requesting for this pt to come back in 4 wks. There are no available appt slots. Can you please assist.  Electronically signed by Rema Fendt at 04/06/2022  8:25 AM MDT

## 2022-04-07 NOTE — Telephone Encounter (Signed)
Formatting of this note might be different from the original.  05/30/2022 AT 48PM WITH DR. Iona Beard  Electronically signed by Lorraine Lax, MA at 04/07/2022  1:41 PM MDT

## 2022-04-08 ENCOUNTER — Encounter

## 2022-04-11 ENCOUNTER — Other Ambulatory Visit: Payer: Self-pay

## 2022-04-14 NOTE — Telephone Encounter (Signed)
Formatting of this note might be different from the original.  Script signed off  Called and advised patient script was sent  She verbalized Web designer signed by Orlena Sheldon, RN at 04/14/2022 12:17 PM EDT

## 2022-04-14 NOTE — Telephone Encounter (Signed)
Formatting of this note might be different from the original.  Script proposed  Electronically signed by Orlena Sheldon, RN at 04/14/2022 11:38 AM EDT

## 2022-05-20 ENCOUNTER — Other Ambulatory Visit: Payer: Self-pay

## 2022-05-21 ENCOUNTER — Other Ambulatory Visit: Payer: Self-pay | Admitting: Hematology and Oncology

## 2022-05-24 ENCOUNTER — Ambulatory Visit (HOSPITAL_COMMUNITY)
Admission: RE | Admit: 2022-05-24 | Discharge: 2022-05-24 | Disposition: A | Discharge status: Home / Self Care | Payer: Medicare Other | Source: Ambulatory Visit | Attending: Hematology and Oncology | Admitting: Hematology and Oncology

## 2022-05-24 ENCOUNTER — Other Ambulatory Visit: Payer: Self-pay | Admitting: Hematology and Oncology

## 2022-05-24 DIAGNOSIS — C8290 Follicular lymphoma, unspecified, unspecified site: Secondary | ICD-10-CM | POA: Diagnosis not present

## 2022-05-24 DIAGNOSIS — C8253 Diffuse follicle center lymphoma, intra-abdominal lymph nodes: Secondary | ICD-10-CM | POA: Insufficient documentation

## 2022-05-24 MED ORDER — SODIUM CHLORIDE (PF) 0.9 % IJ SOLN
INTRAMUSCULAR | Status: AC
  Filled 2022-05-24: qty 50

## 2022-05-24 MED ORDER — IOHEXOL 300 MG/ML  SOLN
100.0000 mL | Freq: Once | INTRAMUSCULAR | Status: AC | PRN
  Administered 2022-05-24: 100 mL via INTRAVENOUS

## 2022-05-25 ENCOUNTER — Other Ambulatory Visit: Payer: Self-pay

## 2022-05-25 DIAGNOSIS — C8253 Diffuse follicle center lymphoma, intra-abdominal lymph nodes: Secondary | ICD-10-CM

## 2022-05-26 ENCOUNTER — Inpatient Hospital Stay: Payer: Medicare Other | Attending: Hematology and Oncology

## 2022-05-26 ENCOUNTER — Inpatient Hospital Stay (HOSPITAL_BASED_OUTPATIENT_CLINIC_OR_DEPARTMENT_OTHER): Payer: Medicare Other | Admitting: Hematology and Oncology

## 2022-05-26 ENCOUNTER — Other Ambulatory Visit: Payer: Self-pay

## 2022-05-26 VITALS — BP 119/69 | HR 72 | Temp 98.3°F | Resp 16 | Wt 161.3 lb

## 2022-05-26 DIAGNOSIS — D72819 Decreased white blood cell count, unspecified: Secondary | ICD-10-CM | POA: Insufficient documentation

## 2022-05-26 DIAGNOSIS — Z800 Family history of malignant neoplasm of digestive organs: Secondary | ICD-10-CM | POA: Diagnosis not present

## 2022-05-26 DIAGNOSIS — Z95828 Presence of other vascular implants and grafts: Secondary | ICD-10-CM | POA: Diagnosis not present

## 2022-05-26 DIAGNOSIS — C8253 Diffuse follicle center lymphoma, intra-abdominal lymph nodes: Secondary | ICD-10-CM

## 2022-05-26 DIAGNOSIS — K219 Gastro-esophageal reflux disease without esophagitis: Secondary | ICD-10-CM | POA: Insufficient documentation

## 2022-05-26 DIAGNOSIS — Z803 Family history of malignant neoplasm of breast: Secondary | ICD-10-CM | POA: Diagnosis not present

## 2022-05-26 DIAGNOSIS — Z7982 Long term (current) use of aspirin: Secondary | ICD-10-CM | POA: Insufficient documentation

## 2022-05-26 DIAGNOSIS — Z8572 Personal history of non-Hodgkin lymphomas: Secondary | ICD-10-CM | POA: Diagnosis not present

## 2022-05-26 DIAGNOSIS — E785 Hyperlipidemia, unspecified: Secondary | ICD-10-CM | POA: Insufficient documentation

## 2022-05-26 DIAGNOSIS — Z79899 Other long term (current) drug therapy: Secondary | ICD-10-CM | POA: Diagnosis not present

## 2022-05-26 LAB — CBC WITH DIFFERENTIAL (CANCER CENTER ONLY)
Abs Immature Granulocytes: 0.02 10*3/uL (ref 0.00–0.07)
Basophils Absolute: 0.1 10*3/uL (ref 0.0–0.1)
Basophils Relative: 1 %
Eosinophils Absolute: 0.1 10*3/uL (ref 0.0–0.5)
Eosinophils Relative: 2 %
HCT: 43.6 % (ref 36.0–46.0)
Hemoglobin: 14.8 g/dL (ref 12.0–15.0)
Immature Granulocytes: 0 %
Lymphocytes Relative: 29 %
Lymphs Abs: 1.5 10*3/uL (ref 0.7–4.0)
MCH: 30.8 pg (ref 26.0–34.0)
MCHC: 33.9 g/dL (ref 30.0–36.0)
MCV: 90.6 fL (ref 80.0–100.0)
Monocytes Absolute: 0.4 10*3/uL (ref 0.1–1.0)
Monocytes Relative: 8 %
Neutro Abs: 3.1 10*3/uL (ref 1.7–7.7)
Neutrophils Relative %: 60 %
Platelet Count: 179 10*3/uL (ref 150–400)
RBC: 4.81 MIL/uL (ref 3.87–5.11)
RDW: 12.9 % (ref 11.5–15.5)
WBC Count: 5.2 10*3/uL (ref 4.0–10.5)
nRBC: 0 % (ref 0.0–0.2)

## 2022-05-26 LAB — CMP (CANCER CENTER ONLY)
ALT: 17 U/L (ref 0–44)
AST: 19 U/L (ref 15–41)
Albumin: 4.2 g/dL (ref 3.5–5.0)
Alkaline Phosphatase: 95 U/L (ref 38–126)
Anion gap: 3 — ABNORMAL LOW (ref 5–15)
BUN: 15 mg/dL (ref 8–23)
CO2: 30 mmol/L (ref 22–32)
Calcium: 9.1 mg/dL (ref 8.9–10.3)
Chloride: 106 mmol/L (ref 98–111)
Creatinine: 0.91 mg/dL (ref 0.44–1.00)
GFR, Estimated: >60 mL/min (ref >60)
Glucose, Bld: 76 mg/dL (ref 70–99)
Potassium: 4 mmol/L (ref 3.5–5.1)
Sodium: 139 mmol/L (ref 135–145)
Total Bilirubin: 0.5 mg/dL (ref 0.3–1.2)
Total Protein: 6.5 g/dL (ref 6.5–8.1)

## 2022-05-26 LAB — LACTATE DEHYDROGENASE: LDH: 157 U/L (ref 98–192)

## 2022-05-26 NOTE — Progress Notes (Signed)
[place] [phone]   [phone]  PROGRESS NOTE  Patient Care Team: Prince Solian, MD as PCP - General (Internal Medicine) Mcarthur Rossetti, MD as Consulting Physician (Orthopedic Surgery)  Hematological/Oncological History # Non-Hodgkin B Cell Lymphoma, Follicular Lymphoma. Stage III 1) 02/25/2020: patient underwent excision of a 1.3 cm mobile nodule on the left vertex of the scalp. 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  2) 03/18/2020: Establish care with Dr. Lorenso Courier  3) 03/30/2020: PET CT scan showed  involvement of the left scalp, left axillary lymph node, left hilar and infrahilar nodes, subcarinal node, and mesenteric lymph nodes, as well as a masslike appearance/ high metabolic activity in the lower uterus/cervix.  4) 04/21/2020: biopsy of the mass in the lower uterus/cervix consistent with follicular lymphoma.  5) 06/25/2020: CT C/A/P showed the soft tissue mass in the abdomen increase in size from 5.8 x 3.6 cm to 6.5 x 5.7 cm. The other lymphadenopathy was stable. Patient elected to move forward with treatment.  6) 07/06/2020: Cycle 1 Day 1 of Rituximab/Bendamustine 7) 07/17/2020-07/22/2020: Admitted to [facility] with fevers. Infectious workup negative, but scans show mesenteric panniculitis (fibrosing mesenteritis). Started on prednisone '40mg'$  PO daily.  8) 08/06/2020-08/27/2020: Weekly Rituximab monotherapy.  9) 09/28/2020: PET CT scan shows no residual disease. Complete response to therapy noted.  10) 10/23/2020: start of rituximab q 8 weeks for consolidation.  11) 12/25/2020: dose 2 of q 8 week rituximab  12) 02/26/2021: dose 3 of q 8 week rituximab  13) 04/28/2021: dose 4 of q 8 week rituximab  14) 05/26/2022: CT chest abdomen pelvis showed no evidence of residual or recurrent disease.  Interval History:  Alyssa Elliott 71 y.o. female with medical history significant for  diffuse non Hodgkin B cell lymphoma who presents for a follow up visit. The patient's last visit was on 11/22/2021. In the interim since her last visit she has had no major changes in her health. She presents today for a routine surveillance visit.   On exam today Alyssa Elliott is accompanied by her husband.  She reports she has been well overall interim since her last visit.  She reports that she feels much better and continues to improve.  Her energy is a 7 or 8 out of 10 today.  She notes that her appetite has been good though she is not as active as she should be.  She does "yoga occasionally".  She notes she has had no issues with infections in the interim since her last visit.  Overall she is at her baseline level of health and feels quite well.  She denies having any issues with fevers, chills, sweats, nausea, vomiting or diarrhea.She otherwise has no, questions or concerns.  A full 10 point ROS is listed below.  MEDICAL HISTORY:  Past Medical History:  Diagnosis Date   Allergy    Anxiety    Asthma    Cataract 2018   removed   Complication of anesthesia    drop in BP   DDD (degenerative disc disease)    Depression    GERD (gastroesophageal reflux disease)    Hemorrhoids    Hyperlipidemia    Lymphoma ([place]) dx 2021   no tx for now   Vitamin D deficiency     SURGICAL HISTORY: Past Surgical History:  Procedure Laterality Date   ATRIAL ABLATION SURGERY  1997   SVT   cataract surgery Bilateral 2017   COLONOSCOPY  2016  FOOT NEUROMA SURGERY     multiple times bilaterally   IR IMAGING GUIDED PORT INSERTION  07/13/2020   IR REMOVAL TUN ACCESS W/ PORT W/O FL MOD SED  06/04/2021   PLANTAR FASCIA RELEASE Left 1999 or 2000   SHOULDER ARTHROSCOPY Left 04/19/2019   Procedure: LEFT SHOULDER MANIPULATION UNDER ANESTHESIA AND ARTHROSCOPY WITH EXTENSIVE DEBRIDEMENT;  Surgeon: Mcarthur Rossetti, MD;  Location: [facility];  Service: Orthopedics;  Laterality: Left;   UPPER GI ENDOSCOPY  yrs  ago    SOCIAL HISTORY: Social History   Socioeconomic History   Marital status: Married    Spouse name: Not on file   Number of children: 0   Years of education: Not on file   Highest education level: Not on file  Occupational History   Occupation: retired Catering manager  Tobacco Use   Smoking status: Never   Smokeless tobacco: Never  Vaping Use   Vaping Use: Never used  Substance and Sexual Activity   Alcohol use: No   Drug use: No   Sexual activity: Not Currently    Partners: Male    Birth control/protection: Post-menopausal  Other Topics Concern   Not on file  Social History Narrative   Not on file   Social Determinants of Health   Financial Resource Strain: Not on file  Food Insecurity: Not on file  Transportation Needs: Not on file  Physical Activity: Not on file  Stress: Not on file  Social Connections: Not on file  Intimate Partner Violence: Not on file    FAMILY HISTORY: Family History  Problem Relation Age of Onset   Heart disease Mother    Diabetes Mother    Tuberculosis Paternal Grandfather    Bone cancer Paternal Grandfather    Breast cancer Sister 75   Melanoma Sister    Cirrhosis Maternal Grandfather    Heart failure Paternal Grandmother    Colon cancer Neg Hx    Ovarian cancer Neg Hx    Uterine cancer Neg Hx     ALLERGIES:  is allergic to bupropion, codeine, levofloxacin, penicillins, keflex [cephalexin], latex, and sulfa antibiotics.  MEDICATIONS:  Current Outpatient Medications  Medication Sig Dispense Refill   ARIPiprazole (ABILIFY) 2 MG tablet Take 2 mg by mouth daily.      aspirin 81 MG tablet Take 81 mg by mouth at bedtime.      [employer] FIBER GUMMIES PO Take 2 each by mouth daily.      Melatonin 5 MG TABS Take 20 mg by mouth at bedtime.      meloxicam (MOBIC) 15 MG tablet Take 1 tablet by mouth daily as needed.     pantoprazole (PROTONIX) 40 MG tablet Take 40 mg by mouth 2 (two) times daily.     PARoxetine (PAXIL) 40 MG tablet  Take 40 mg by mouth daily.      polyethylene glycol (MIRALAX) 17 g packet Take 17 g by mouth daily. 14 each 0   Rosuvastatin Calcium 20 MG CPSP 1 tablet     senna-docusate (SENOKOT-S) 8.6-50 MG tablet Take 1 tablet by mouth daily. 30 tablet 0   Vitamin D, Ergocalciferol, (DRISDOL) 50000 UNITS CAPS Take 50,000 Units by mouth every Monday.      No current facility-administered medications for this visit.   Facility-Administered Medications Ordered in Other Visits  Medication Dose Route Frequency Provider Last Rate Last Admin   0.9 %  sodium chloride infusion   Intravenous Continuous Boisseau, Hayley, PA  REVIEW OF SYSTEMS:   Constitutional: ( - ) fevers, ( - )  chills , ( - ) night sweats Eyes: ( - ) blurriness of vision, ( - ) double vision, ( - ) watery eyes Ears, nose, mouth, throat, and face: ( - ) mucositis, ( - ) sore throat Respiratory: ( - ) cough, ( - ) dyspnea, ( - ) wheezes Cardiovascular: ( - ) palpitation, ( - ) chest discomfort, ( - ) lower extremity swelling Gastrointestinal:  ( - ) nausea, ( - ) heartburn, ( - ) change in bowel habits Skin: ( - ) abnormal skin rashes Lymphatics: ( - ) new lymphadenopathy, ( - ) easy bruising Neurological: ( - ) numbness, ( - ) tingling, ( - ) new weaknesses Behavioral/Psych: ( - ) mood change, ( - ) new changes  All other systems were reviewed with the patient and are negative.  PHYSICAL EXAMINATION: GENERAL: well appearing elderly Caucasian female. alert, no distress and comfortable SKIN: scalp lesion reduced to a concave dent in scalp. skin color, texture, turgor are normal, no rashes or significant lesions EYES: conjunctiva are pink and non-injected, sclera clear LYMPH:  no palpable lymphadenopathy in the cervical, axillary or supraclavicular LUNGS: clear to auscultation and percussion with normal breathing effort HEART: regular rate & rhythm and no murmurs and no lower extremity edema Musculoskeletal: no cyanosis of digits and  no clubbing  PSYCH: alert & oriented x 3, fluent speech NEURO: no focal motor/sensory deficits  LABORATORY DATA:  I have reviewed the data as listed    Latest Ref Rng & Units 05/26/2022    2:38 PM 12/10/2021    5:47 PM 11/22/2021   11:10 AM  CBC  WBC 4.0 - 10.5 K/uL 5.2  7.5  3.3   Hemoglobin 12.0 - 15.0 g/dL 14.8  15.2  14.7   Hematocrit 36.0 - 46.0 % 43.6  44.8  44.2   Platelets 150 - 400 K/uL 179  204  215        Latest Ref Rng & Units 05/26/2022    2:38 PM 12/10/2021    5:47 PM 11/22/2021   11:10 AM  CMP  Glucose 70 - 99 mg/dL 76  124  88   BUN 8 - 23 mg/dL '15  16  15   '$ Creatinine 0.44 - 1.00 mg/dL 0.91  0.79  0.95   Sodium 135 - 145 mmol/L 139  138  143   Potassium 3.5 - 5.1 mmol/L 4.0  4.0  4.3   Chloride 98 - 111 mmol/L 106  103  108   CO2 22 - 32 mmol/L '30  24  30   '$ Calcium 8.9 - 10.3 mg/dL 9.1  8.6  9.3   Total Protein 6.5 - 8.1 g/dL 6.5  6.7  6.5   Total Bilirubin 0.3 - 1.2 mg/dL 0.5  0.8  0.4   Alkaline Phos 38 - 126 U/L 95  83  87   AST 15 - 41 U/L 19  34  25   ALT 0 - 44 U/L '17  21  19     '$ No results found for: "MPROTEIN" No results found for: "KPAFRELGTCHN", "LAMBDASER", "KAPLAMBRATIO"   RADIOGRAPHIC STUDIES:  CT CHEST ABDOMEN PELVIS W CONTRAST  Result Date: 05/24/2022 CLINICAL DATA:  History of follicular lymphoma, monitor/surveillance. * Tracking Code: BO * EXAM: CT CHEST, ABDOMEN, AND PELVIS WITH CONTRAST TECHNIQUE: Multidetector CT imaging of the chest, abdomen and pelvis was performed following the standard protocol during bolus administration of  intravenous contrast. RADIATION DOSE REDUCTION: This exam was performed according to the departmental dose-optimization program which includes automated exposure control, adjustment of the mA and/or kV according to patient size and/or use of iterative reconstruction technique. CONTRAST:  186m OMNIPAQUE IOHEXOL 300 MG/ML  SOLN COMPARISON:  Multiple priors including most recent CTs December 10, 2021 and November 12, 2021.  FINDINGS: CT CHEST FINDINGS Cardiovascular: Normal caliber thoracic aorta. No central pulmonary embolus on this nondedicated study. Normal size heart. No significant pericardial effusion/thickening. Mediastinum/Nodes: No supraclavicular adenopathy. No discrete thyroid nodule. No pathologically enlarged mediastinal, hilar or axillary lymph nodes. The esophagus is grossly unremarkable. Lungs/Pleura: No suspicious pulmonary nodules or masses. No focal airspace consolidation. No pleural effusion. No pneumothorax. Musculoskeletal: Sclerotic focus in the T4 vertebral body is unchanged dating back to at least 2014 consistent with a benign etiology. No aggressive lytic or blastic lesion of bone. Multilevel degenerative changes spine. CT ABDOMEN PELVIS FINDINGS Hepatobiliary: No suspicious hepatic lesion. Gallbladder is unremarkable. No biliary ductal dilation. Pancreas: No pancreatic ductal dilation or evidence of acute inflammation. Spleen: No splenomegaly or focal splenic lesion. Adrenals/Urinary Tract: Bilateral adrenal glands appear normal. No hydronephrosis. Kidneys demonstrate symmetric enhancement and excretion of contrast material. Urinary bladder is unremarkable for degree of distension. Stomach/Bowel: Radiopaque enteric contrast material traverses the hepatic flexure. Stomach is nondistended limiting evaluation. No pathologic dilation of small or large bowel. The appendix fills with orally ingested contrast material without evidence of acute inflammation. Moderate volume of formed stool throughout the colon as can be seen with constipation. Vascular/Lymphatic: Normal caliber abdominal aorta. No pathologically enlarged abdominal or pelvic lymph nodes. Reproductive: Uterus and bilateral adnexa are unremarkable. Other: No significant abdominopelvic free fluid. Musculoskeletal: No aggressive lytic or blastic lesion of bone. Multilevel degenerative changes spine. IMPRESSION: 1. No lymphadenopathy above or below the  diaphragm and no splenomegaly to suggest lymphomatous disease recurrence. 2. Moderate volume of formed stool throughout the colon suggestive of constipation. Electronically Signed   By: JDahlia BailiffM.D.   On: 05/24/2022 18:25    ASSESSMENT & PLAN Alyssa TACKER89y.o. female with medical history significant for diffuse non Hodgkin B cell lymphoma who presents for a follow up visit.  After review of the imaging, review the labs, discussion with the patient her findings are most consistent with a diffuse non-Hodgkin B-cell lymphoma, follicular lymphoma.  The initial diagnosis based off the biopsy from the lesion on the head was that of a follicle cell lymphoma which tends to be a cutaneous lymphoma, however this diagnosis was considerably less likely given the diffuse spread throughout the lymph nodes of the chest and abdomen. Biopsy of the FDG avid lesion in the pelvis resulted with follicular lymphoma.   Previously we discussed the diagnosis of follicular lymphoma and the options moving forward.  She currently does not meet any GELF Criteria and therefore is not required to start therapy.  We discussed that treatment could be started if she so desired and we discussed the risks and benefits of bendamustine rituximab therapy.  She voiced her understanding of the different options between starting treatment and continued observation.  After discussion with her husband she noted she would like to start treatment given the continued growth of the lesion which was abutting the urinary bladder.   The regimen of choice for this patient is Rituximab monotherapy with Rituximab '375mg'$ /m2 IV weekly x 4 weeks, followed by IV rituximab q 8 weeks thereafter. This is being administered with curative intent. We transitioned to this  regimen from bendamustine rituximab due to intolerance and hospitalization following her first cycle of treatment.  # Non-Hodgkin B Cell Lymphoma, Follicular Lymphoma. Stage III --pre  treatment CT findings showed lymphadenopathy on both sides of the diaphragm. Consistent with at least a Stage III lymphoma -- confirmed the diagnosis with biopsy of the FDG avid mass near the uterus/cervix. Cancel the left axillary lymph node biopsy --started treatment with R-Benda, however the patient ended up hospitalized due to fever, weakness, and marked drop in her labs.  She did not tolerate rituximab and bendamustine combination altogether and therefore we proceeded with rituximab monotherapy alone. She completed Rituximab weekly x 4 weeks.  Plan:  --CT imaging recommend q 6 months x 2 years.  Next CT scan due in 6 months time. Last scan on 05/24/2022 showed no evidence of residual/recurrent disease.  -- Patient has completed maintenance rituximab --RTC in 6 months time   # Leukopenia -- Unclear etiology of leukopenia though does appear to be trending upward --Today's labs show white blood cell count 5.2, hemoglobin 14.8, MCV 89.6, and platelets of 179  #Fibrosing Mesenteritis (Mesenteric Panniculitis), resolved --improved with prednisone taper, complete resolution of symptoms.  --completed steroid taper.  --continue to monitor   #Supportive Care --port removed --zofran '8mg'$  q8H PRN and compazine '10mg'$  PO q6H for nausea -- EMLA cream for port -- no pain medication required at this time.   Orders Placed This Encounter  Procedures   CT CHEST ABDOMEN PELVIS W CONTRAST    Standing Status:   Future    Standing Expiration Date:   05/27/2023    Order Specific Question:   Preferred imaging location?    Answer:   [facility]    Order Specific Question:   Is Oral Contrast requested for this exam?    Answer:   Yes, Per Radiology protocol   All questions were answered. The patient knows to call the clinic with any problems, questions or concerns.  A total of more than 30 minutes were spent on this encounter and over half of that time was spent on counseling and coordination of care  as outlined above.   Ledell Peoples, MD Department of Hematology/Oncology [place] at [facility] Phone: [phone] Pager: [phone] Email: [email or web address]  05/26/2022 5:09 PM

## 2022-06-08 ENCOUNTER — Encounter

## 2022-06-22 NOTE — Telephone Encounter (Signed)
Summary: Order Clarification    Formatting of this note might be different from the original.  [place] from [facility] scheduling is requesting for you to return her call @ [phone]. She need a clarification of a order or a new order for this Pt  Electronically signed by Rema Fendt at 06/22/2022  8:34 AM MDT

## 2022-06-29 NOTE — Telephone Encounter (Signed)
Formatting of this note might be different from the original.  LVM for [place] requesting a call back to answer clarification question.  Electronically signed by Caroll Rancher, MA at 06/29/2022  6:42 AM MDT

## 2022-07-04 ENCOUNTER — Inpatient Hospital Stay: Admit: 2022-07-04 | Payer: MEDICARE | Attending: Nephrology | Primary: Family

## 2022-07-04 DIAGNOSIS — I130 Hypertensive heart and chronic kidney disease with heart failure and stage 1 through stage 4 chronic kidney disease, or unspecified chronic kidney disease: Secondary | ICD-10-CM

## 2022-07-04 DIAGNOSIS — G459 Transient cerebral ischemic attack, unspecified: Secondary | ICD-10-CM

## 2022-07-06 ENCOUNTER — Ambulatory Visit: Payer: Self-pay

## 2022-07-06 ENCOUNTER — Ambulatory Visit (INDEPENDENT_AMBULATORY_CARE_PROVIDER_SITE_OTHER): Payer: Medicare Other

## 2022-07-06 ENCOUNTER — Encounter: Payer: Self-pay | Admitting: Orthopaedic Surgery

## 2022-07-06 ENCOUNTER — Ambulatory Visit (INDEPENDENT_AMBULATORY_CARE_PROVIDER_SITE_OTHER): Payer: Medicare Other | Admitting: Orthopaedic Surgery

## 2022-07-06 DIAGNOSIS — G8929 Other chronic pain: Secondary | ICD-10-CM | POA: Diagnosis not present

## 2022-07-06 DIAGNOSIS — M25562 Pain in left knee: Secondary | ICD-10-CM

## 2022-07-06 DIAGNOSIS — M25561 Pain in right knee: Secondary | ICD-10-CM

## 2022-07-06 LAB — VAS RENAL ARTERIAL DUP COMPLETE
Ao dist AP: 1.46 cm
Ao mid PSV: 49.4 cm/s
Ao prox AP: 1.93 cm
Ao prox PSV: 46.9 cm/s
Celiac EDV: 19.5 cm/s
Celiac PSV: 85.9 cm/s
Left Renal Dist RI: 0.85
Left Renal Mid RI: 0.78
Left Renal Prox RI: 0.75
Left Renal RAR: 0.94
Left kidney length: 9.3 cm
Left kidney width: 4.42 cm
Left renal dist EDV: 7.2 cm/s
Left renal dist PSV: 46.6 cm/s
Left renal dist RAR: 0.94
Left renal lower parenchyma EDV: 4.8 cm/s
Left renal lower parenchyma PSV: 16.9 cm/s
Left renal lower parenchyma RI: 0.72
Left renal mid EDV: 6.9 cm/s
Left renal mid PSV: 31.7 cm/s
Left renal mid RAR: 0.64
Left renal middle parenchyma EDV: 5.6 cm/s
Left renal middle parenchyma PSV: 21.9 cm/s
Left renal middle parenchyma RI: 0.74
Left renal prox EDV: 8.1 cm/s
Left renal prox PSV: 32.9 cm/s
Left renal prox RAR: 0.67
Left renal upper parenchyma EDV: 4.1 cm/s
Left renal upper parenchyma PSV: 20.5 cm/s
Left renal upper parenchyma RI: 0.8
Prox SMA EDV: 17 cm/s
Prox SMA PSV: 91.9 cm/s
Right Renal Dist RI: 0.79
Right Renal Mid RI: 0.82
Right Renal Prox RI: 0.78
Right Renal RAR: 0.79
Right kidney length: 9.28 cm
Right kidney width: 4.09 cm
Right renal dist EDV: 8.3 cm/s
Right renal dist PSV: 39 cm/s
Right renal dist RAR: 0.79
Right renal lower parenchyma EDV: 6.4 cm/s
Right renal lower parenchyma PSV: 30.5 cm/s
Right renal lower parenchyma RI: 0.79
Right renal mid EDV: 6.6 cm/s
Right renal mid PSV: 35.8 cm/s
Right renal mid RAR: 0.72
Right renal middle parenchyma EDV: 5.1 cm/s
Right renal middle parenchyma PSV: 24.7 cm/s
Right renal middle parenchyma RI: 0.79
Right renal prox EDV: 7.1 cm/s
Right renal prox PSV: 31.9 cm/s
Right renal prox RAR: 0.65
Right renal upper parenchyma EDV: 8.2 cm/s
Right renal upper parenchyma PSV: 57.3 cm/s
Right renal upper parenchyma RI: 0.86

## 2022-07-06 NOTE — Progress Notes (Signed)
The patient is a 71 year old female well-known to me.  We have not seen her for some time she has had pain off and on with her knees for about a year with really some popping in the knees and pain going up and down stairs.  She does take meloxicam and this does help.  She is never had injection in her knees or surgery in either knee.  Examination of both knees show just slight patellofemoral crepitation but no effusion.  Both knees have excellent range of motion and are ligamentously stable and really do not have pain today at all.  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  We had a long and thorough discussion about her knees.  I think the best thing for her is continuing meloxicam on occasion and quad strengthening exercises.  If her pain persists, I would recommend steroid injections first and potentially eventually hyaluronic acid injections.  However I do feel were far from this because her meloxicam is helping and if she tries quad strengthening that will help as well.  She agrees with this treatment plan.  All questions and concerns were answered and addressed.

## 2022-07-06 NOTE — Progress Notes (Signed)
Formatting of this note is different from the original.  Images from the original note were not included.      Name: Lindsey Huynh Patient ID: [id number]  Age: 71 y.o. Sex: female Date of Birth: 01/13/1951   Date of Service: 07/06/2022   Author: Beola Cord, NP    Lindsey Huynh was seen today for follow-up.  Diagnoses and all orders for this visit:  Heart failure with reduced ejection fraction (CMS/HCC)  -     ECG 12 lead - Clinic Performed  -     Magnesium; Future  -     N-Terminal ProBNP; Future  -     Basic Metabolic Panel; Future  Essential hypertension  Status post coronary artery bypass graft  Cardiomyopathy, ischemic    -euvolemic on exam   -NYHA class II  -BP now low, EF midrange  -Guideline directed medical therapy: continue spironolactone 25 mg daily, Jardiance 10 mg daily, Entresto tier 2, coreg 6.25 mg BID ( avoid higher doses due to bradycardia)  -Labs for surveillance of renal function and electrolytes, nt-probnp for prognostication  -encouraged to check daily BP/weights, call with 3 lbs weight gain overnight, or 5 lbs in 1 week   -exercise as tolerated  -for CAD, continue high dose lipitor 80 mg daily and aspirin 81 mg daily.  Follow up in about 6 months (around 01/05/2023) for keep follow up with Dr Veverly Fells in January 2024.    Physician available for consultation (did not participate in visit): Lora Havens MD    Orders Placed This Encounter   Procedures   ? Magnesium     Standing Status:   Future     Standing Expiration Date:   07/07/2023   ? N-Terminal ProBNP     Standing Status:   Future     Standing Expiration Date:   07/07/2023   ? Basic Metabolic Panel     Standing Status:   Future     Standing Expiration Date:   07/07/2023   ? ECG 12 lead - Clinic Performed     Subjective     Chief complaint: heart failure management     Visit type: Follow up    Hx: I had the pleasure of seeing  Lindsey Huynh.  She is a 71 y.o. African American female with past medical history of PEA arrest, CAD (s/p CABG  12/2020), HF with mid-range EF (EF 40%)- ICM, HTN, DMII. Also with HLD, PVD, and CVA    TTE 03/2022  1. Concentric LV remodeling with mildly depressed LV systolic function.  Estimated LVEF 40 to 45%. Akinesis of the inferolateral wall and hypokinesis inferior wall with a small aneurysmal area in the basal inferior wall  2. Diastolic dysfunction with normal left atrial pressures  3. Normal RV size and systolic function  4. Biatrial sizes normal  5. Mild mitral regurgitation. Sclerotic cardiac valves  6. Normal size aortic root  7. Normal IVC size and estimated right atrial pressure    Today she reports feeling fair, denies SOB, chest pain or palpitations. No dizziness.   No LE edema, orthopnea or PND.   No nausea or changes in appetite.    Past Surgical History:   Procedure Laterality Date   ? TOE AMPUTATION Left    ? VASCULAR SURGERY Right 02/16/2022    leg     Patient Active Problem List   Diagnosis   ? Acute systolic (congestive) heart failure (CMS/HCC)   ? Pneumonia due to COVID-19 virus   ? Stage  2 acute kidney injury (CMS/HCC)   ? Ischemic hepatitis   ? Type 2 diabetes mellitus without complication, without long-term current use of insulin (CMS/HCC)   ? Essential hypertension   ? Hypercholesterolemia   ? Glaucoma of left eye   ? GERD (gastroesophageal reflux disease)   ? Cardiac arrest (CMS/HCC)   ? Transient ischemic attack   ? CAD in native artery   ? Acute pain   ? Acute blood loss anemia   ? Status post coronary artery bypass graft   ? Post-operative pain   ? Status post amputation of left foot (CMS/HCC)   ? Chronic renal disease, stage III (CMS/HCC)     Current Outpatient Medications:   ?  acetaminophen (Tylenol) 325 MG tablet, Take 650 mg by mouth every 8 hours as needed for mild pain. , Disp: , Rfl:   ?  aspirin 81 mg chewable tablet, Chew 1 tablet daily., Disp: 90 tablet, Rfl: 3  ?  atorvastatin (Lipitor) 80 MG tablet, Take 1 tablet by mouth daily., Disp: 90 tablet, Rfl: 3  ?  carvedilol (Coreg) 6.25  MG tablet, Take 1 tablet by mouth 2 times daily with meals., Disp: 180 tablet, Rfl: 3  ?  empagliflozin (Jardiance) 10 MG, Take 1 tablet by mouth daily., Disp: 30 tablet, Rfl: 11  ?  furosemide (Lasix) 20 mg tablet, Take 1 tablet by mouth as needed (weight gain 3-5 lbs)., Disp: 30 tablet, Rfl: 11  ?  gabapentin (Neurontin) 100 mg capsule, Take 1 capsule by mouth 3 times a day. (Patient taking differently: Take 100 mg by mouth 3 times a day as needed.), Disp: 90 capsule, Rfl: 0  ?  metFORMIN XR (Glucophage-XR) 500 mg 24 hr tablet, Take 1,000 mg by mouth daily with evening meal. Do not crush, chew, or split., Disp: , Rfl:   ?  sacubitril-valsartan (Entresto) 49-51 mg tablet, Take 1 tablet by mouth 2 times daily., Disp: 60 tablet, Rfl: 11  ?  spironolactone (Aldactone) 25 MG tablet, Take 25 mg by mouth daily., Disp: , Rfl:     Allergies: Neuromuscular blocking agents    Social history:   Social History     Socioeconomic History   ? Marital status: Unknown     Spouse name: Not on file   ? Number of children: Not on file   ? Years of education: Not on file   ? Highest education level: Not on file   Occupational History   ? Not on file   Tobacco Use   ? Smoking status: Never   ? Smokeless tobacco: Never   Vaping Use   ? Vaping Use: Never used   Substance and Sexual Activity   ? Alcohol use: Yes     Comment: daughter reports social drinking   ? Drug use: Not Currently   ? Sexual activity: Defer   Other Topics Concern   ? Not on file   Social History Narrative   ? Not on file     Social Determinants of Health     Financial Resource Strain: Not on file   Food Insecurity: No Food Insecurity (12/28/2020)    Hunger Vital Sign    ? Worried About Charity fundraiser in the Last Year: Never true    ? Ran Out of Food in the Last Year: Never true   Transportation Needs: No Transportation Needs (12/28/2020)    PRAPARE - Transportation    ? Lack of Transportation (Medical): No    ?  Lack of Transportation (Non-Medical): No   Physical  Activity: Not on file   Stress: Not on file   Social Connections: Not on file   Intimate Partner Violence: Not on file   Housing Stability: Unknown (12/28/2020)    Housing Stability Vital Sign    ? Unable to Pay for Housing in the Last Year: No    ? Number of Places Lived in the Last Year: Not on file    ? Unstable Housing in the Last Year: No       Family history:   Family History   Family history unknown: Yes       Review of Systems   Constitutional: Negative for activity change, appetite change and fatigue.   Respiratory: Negative for apnea, chest tightness and shortness of breath.    Cardiovascular: Negative for chest pain, palpitations and leg swelling.   Gastrointestinal: Negative for abdominal distention and nausea.   Neurological: Negative for dizziness, syncope, weakness and light-headedness.     Objective   Vitals:  Visit Vitals  BP 101/70 (BP Location: Left arm, Patient Position: Sitting, BP Cuff Size: Adult)   Pulse 52   Temp 36.4 C (97.5 F) (Oral)   Resp 16   Wt 77.8 kg   SpO2 100%   BMI 29.44 kg/m   OB Status Hysterectomy   Smoking Status Never   BSA 1.87 m     Physical Exam  Neck:      Vascular: No JVD.   Cardiovascular:      Rate and Rhythm: Normal rate and regular rhythm.      Pulses: Normal pulses.      Heart sounds: Normal heart sounds. No murmur heard.  Pulmonary:      Breath sounds: Normal breath sounds.   Abdominal:      General: Bowel sounds are normal. There is no distension.      Palpations: Abdomen is soft.   Musculoskeletal:      Right lower leg: No edema.      Left lower leg: No edema.   Skin:     General: Skin is warm.   Neurological:      General: No focal deficit present.      Mental Status: She is alert and oriented to person, place, and time.       cho/Echo Stress:  Enc. date: 04/13/22    TRANSTHORACIC ECHO (TTE) COMPLETE    Narrative  [facility]                      +------------+  +--------------------------+      Center                        :            :  :                           : [address]                   :            :  :                          [facility]                        :            :  :                          :  [place], [place]                     :            :  :                          :      [id number]                         :            :  +--------------------------+ Phone: [phone]                    [phone]  [phone]    Transthoracic Echocardiography Report  +-----------------------------------------------------------------------------+  :Name: Lyn Hollingshead         Study Date: 04/13/2022 01:46 PM             :  :Attending Physician: Suzi Roots                           Accession#: [id number]                      :  :MRN: [id number]                    Patient Location: Heart Station - OP        :  :DOB: 02-Nov-1950                  Gender: Female                              :  :Age: 22 yrs                      BP: 152/89 mmHg                             :  :Height: 64 in                    Weight: 174 lb                              :  :                                                                             :  :BSA: 1.8 m2                                                                  :  :  Heart Rate: 63                                                               :  :Reason For Study: Acute systolic                                             :  :CHF                                                                          :  :History: Acute CHF                                                           :  +-----------------------------------------------------------------------------+    PROCEDURE  Procedure(CPT Code): TTE Complete (02725-36) 2D with Doppler and Color Flow:  No add on codes required). A injection of Lumason contrast agent was performed  to improve image quality. A total of 4 cc of contrast was given.    Interpretation Summary  Quality adequate  1. Concentric LV  remodeling with mildly depressed LV systolic function.  Estimated LVEF 40 to 45%. 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 Diastolic dysfunction with normal left atrial pressures  3. Normal RV size and systolic function  4. Biatrial sizes normal  5. Mild mitral regurgitation. Sclerotic cardiac valves  6. Normal size aortic root  7. Normal IVC size and estimated right atrial pressure    LEFT VENTRICLE  Normal left ventricular cavity size. There is concentric left ventricular  remodeling. LV ejection fraction = 40-45%. There is mild left ventricular  diastolic dysfunction with normal left atrial pressure. Akinesis of the  inferolateral wall and hypokinesis inferior wall with a small aneurysmal area  in the basal inferior wall.    RIGHT VENTRICLE  The right ventricle is normal size. The right ventricular systolic function is  normal.    LEFT ATRIUM  The left atrial size is normal.    RIGHT ATRIUM  Right atrial size is normal.    AORTIC VALVE  The aortic valve is trileaflet. There is mild aortic valve thickening. There  is no aortic stenosis. There is no aortic regurgitation.    MITRAL VALVE  There are mildly thickened mitral valve leaflets. There is mild mitral  regurgitation.    TRICUSPID VALVE  Structurally normal tricuspid valve. There is trivial tricuspid regurgitation.  There is insufficient tricuspid regurgitation to estimate right ventricular  systolic pressure.    PULMONIC VALVE  Structurally normal pulmonic valve. Trace pulmonic valvular regurgitation.    ARTERIES  The aortic root is normal size. Maximal visible dimension is 3.2 cm.  The  proximal ascending aorta appears normal. The transverse aorta is not well  visualized. The proximal descending aorta is not well visualized. The  pulmonary artery is not well visualized.    VENOUS  The inferior vena cava is normal in size.    EFFUSION  Insignificant pericardial effusion or  subepicardial fat.  +-----------------------------------------------------------------------------+  :Normal Values                                                                :  :IVSd: 0.7cm - 1.2cm  LVIDd: 3.5cm - 5.5cm        LVIDs: 2.5cm - 4.0cm        :  :LVPWd: 0.7cm - 1.1cm LA: 1.9cm - 3.8cm           Ao: 2.0cm - 3.7cm           :  :EF: (55 - 75%)       LA Area: <>                 RA Area: <>                 :  :RVd: 4.3cm           LV Mass(Men): <>            LV Mass(Women): <>          :  :                     LV Mass Index(Men): <116g>  LV Mass Index(Women): <96g> :  +-----------------------------------------------------------------------------+  MMode/2D Measurements & Calculations  IVSd: 0.98 cm        LVIDd: 4.2 cm          LV mass(C)d: 145.7 grams  LVIDs: 3.8 cm          LV mass(C)dI: 78.8 grams/m2  LVPWd: 1.1 cm    ______________________________________________________________________________  Ao root diam: 3.3 cm asc Aorta Diam: 3.6 cm LA/Ao: 0.96  LA dimension: 3.1 cm                        LVOT diam: 2.1 cm    ______________________________________________________________________________  EDV(MOD-sp4):        LVLd ap2: 7.9 cm       TAPSE: 1.2 cm  106.0 ml             EDV(MOD-sp2): 112.0 ml  LVLs ap4: 7.0 cm     LVLs ap2: 7.4 cm  ESV(MOD-sp4): 68.0 mlESV(MOD-sp2): 59.1 ml  EF(MOD-sp4): 35.8 %  EF(MOD-sp2): 47.2 %    ______________________________________________________________________________  MR Jet Area_: 4.0 cm2RA ESA: 11.6 cm2       RV Base: 3.1 cm    ______________________________________________________________________________  LA A4Cs: 17.4 cm2    LA ESV (MOD-BP):       LA volume MOD BP Indexed:  45.6 ml                24.6 ml/m2    Time Measurements  Aortic R-R: 1.1 sec  Aortic HR: 57.0 BPM    Doppler Measurements & Calculations  MV E max vel: 55.8 cm/sec MV dec slope: 135.4 cm/sec2  Ao V2 max: 92.3 cm/sec  MV A max vel: 77.1  cm/sec MV dec time: 0.41 sec        Ao max PG: 3.0 mmHg  MV  E/A: 0.72                                           AVA(V,D): 2.9 cm2    ______________________________________________________________________________  LV V1 max PG: 2.0 mmHg    CO(LVOT): 3.5 l/min          AV VR: 0.85  LV V1 mean PG: 1.3 mmHg   SV(LVOT): 61.4 ml  LV V1 max: 78.8 cm/sec  LV V1 mean: 52.9 cm/sec  LV V1 VTI: 18.2 cm    ______________________________________________________________________________  MV P1/2t-pr_: 120.7 msec  RV S Vel: 7.3 cm/sec         MV LAT E': 7.1 cm/sec    ______________________________________________________________________________  MV LAT E/E': 7.9          MV MED E': 4.1 cm/sec        MV MED E/E': 13.5    ______________________________________________________________________________  Electronically Signed By:  Jeni Salles  on 04/13/2022 08:16 PM  Performed By: Lucianne Lei  MRN: [id number]    Enc. date: 12/25/20    TRANSTHORACIC ECHO (TTE) COMPLETE    Narrative  [facility]                      +------------+  +--------------------------+      Center                        :            :  :                          : [address]                   :            :  :                          [facility]                        :            :  [place], [place]                     :            :  :                          :      [id number]                         :            :  +--------------------------+ Phone: [phone]                    [phone]  [phone]    Transthoracic Echocardiography Report  +-----------------------------------------------------------------------------+  :  Name: JERRIANN, SCHROM Date: 12/26/2020 01:50 PM                      :  :Attending Physician:                                                         :  :Daron Offer       Accession#: [id number]                               :  :MRN: [id number]           Patient Location: [facility]  :  :DOB: 11/16/1950         Gender: Female                                        :  :Age: 71 yrs             BP: 105/51 mmHg                                      :  :Height: 67 in           Weight: 170 lb                                       :  :                                                                             :  :BSA: 1.9 m2                                                                  :  :Heart Rate: 90                                                               :  :Reason For Study: Acute                                                      :  :CP, STEMI                                                                    :  :  History: CABG                                                                :  :12/25/2020, CAD, DM,                                                         :  :HFrEF.                                                                       :  +-----------------------------------------------------------------------------+    PROCEDURE  Procedure(CPT Code): TTE Complete (16109-60) 2D with Doppler and Color Flow:  No add on codes required). An echocardiogram was performed at the patient's  bedside. A injection of Lumason contrast agent was performed to improve image  quality. A total of 4 cc of contrast was given. The attending sonographer who  injected the contrast agent was Meryl Dare, RDCS.    Interpretation Summary  Technically poor quality study.  1. Concentric LVH with mildly reduced systolic function. LVEF 40 to 45%.  Despite use of thickened enhancing agent it is difficult to exclude regional  wall motion abnormality. Wall motion seems to be best preserved in the apex.  2. Severely reduced RV systolic function.  3. Mildly thickened and calcified aortic valve without significant stenosis.  4. Dilated IVC.  5. Compared to prior study on 10/27/2020 and Intra-Op TEE, there has been  interval decrease in RV systolic function.    LEFT VENTRICLE  Normal left ventricular cavity size. There is concentric left ventricular  hypertrophy. Left  ventricular systolic function is mildly reduced. LV ejection  fraction = 40-45%. There is mild left ventricular diastolic dysfunction with  normal left atrial pressure. Despite use of echo enhancing agent, regional  wall motion abnormality cannot be excluded. Wall motion seems to be best  preserved in the apex.    RIGHT VENTRICLE  The right ventricle is normal size. The right ventricular systolic function is  severely reduced.    LEFT ATRIUM  The left atrial size is normal.    RIGHT ATRIUM  Right atrial size is normal. There is a catheter seen in the right atrium.    AORTIC VALVE  The aortic valve is not well visualized. Mildly thickened and calcified aortic  valve. There is no aortic stenosis. There is no aortic regurgitation.    MITRAL VALVE  There is mild mitral valve thickening. There is trivial mitral regurgitation.    TRICUSPID VALVE  The tricuspid valve is not well visualized. There is trivial tricuspid  regurgitation. There is insufficient tricuspid regurgitation to estimate right  ventricular systolic pressure.    PULMONIC VALVE  The pulmonic valve is not well visualized.    ARTERIES  The aortic root is not well visualized. The proximal ascending aorta is not  well visualized. The transverse aorta is  not well visualized. The proximal  descending aorta is not well visualized. The pulmonary artery is not well  visualized.    VENOUS  The inferior vena cava is dilated.    EFFUSION  Insignificant pericardial effusion or subepicardial fat.  +-----------------------------------------------------------------------------+  :Normal Values                                                                :  :IVSd: 0.7cm - 1.2cm  LVIDd: 3.5cm - 5.5cm        LVIDs: 2.5cm - 4.0cm        :  :LVPWd: 0.7cm - 1.1cm LA: 1.9cm - 3.8cm           Ao: 2.0cm - 3.7cm           :  :EF: (55 - 75%)       LA Area: <>                 RA Area: <>                 :  :RVd: 4.3cm           LV Mass(Men): <>            LV Mass(Women): <>           :  :                     LV Mass Index(Men): <116g>  LV Mass Index(Women): <96g> :  +-----------------------------------------------------------------------------+  MMode/2D Measurements & Calculations  RVDd: 3.4 cm          LVIDd: 4.5 cm                  LV mass(C)d: 187.3 grams  IVSd: 1.3 cm          LVIDs: 3.6 cm                  LV mass(C)dI:  LVPWd: 0.99 cm                 99.4 grams/m2    ______________________________________________________________________________  Ao root diam: 3.2 cm  LA/Ao: 1.2                     LVLs ap4: 6.4 cm  LA dimension: 3.8 cm  LVOT diam: 2.1 cm    ______________________________________________________________________________  TAPSE: 0.78 cm  RA ESA: 14.8 cm2               LA A4Cs: 20.9 cm2    ______________________________________________________________________________  LA ESV (MOD-BP):      LA volume MOD BP Indexed:  55.3 ml  29.4 ml/m2    Doppler Measurements & Calculations  MV E max vel: 81.4 cm/sec MV dec slope: 507.3 cm/sec2 Ao V2 max: 131.4 cm/sec  MV A max vel: 62.7 cm/sec                             Ao max PG: 6.9 mmHg  MV E/A: 1.3  Ao V2 mean: 80.4 cm/sec  Ao mean PG: 3.1 mmHg  Ao V2 VTI: 18.3 cm  AVA(I,D): 2.7 cm2    AVA(V,D): 2.7 cm2    ______________________________________________________________________________  LV V1 max PG: 4.4 mmHg    SV(LVOT): 49.9 ml           RV S Vel: 5.3 cm/sec  LV V1 mean PG: 1.8 mmHg  LV V1 max: 104.4 cm/sec  LV V1 mean: 62.1 cm/sec  LV V1 VTI: 14.6 cm    ______________________________________________________________________________  MV LAT E': 9.4 cm/sec     MV LAT E/E': 8.7            MV MED E': 6.7 cm/sec    ______________________________________________________________________________  MV MED E/E': 12.1    ______________________________________________________________________________  Electronically Signed By:  Desma Paganini, MD  on 12/26/2020 03:30 PM  Primary Care Physician: Suzi Roots  Performed By: Cleda Mccreedy, RDCS  MRN: [id number]    Cath:  Enc. date: 11/26/20    CARDIAC CATHETERIZATION    Narrative  Table formatting from the original result was not included.  Patient Name: Zena Vitelli  Patient DOB: [birth year] Patient MRN: [id number]  Procedure Date: 11/26/20 Procedure Time: 0160  Service: OUTPATIENT CATH Location: [facility] MAIN CARDIAC CATH  Physicians/Surgeons:  * Edwyna Shell - Primary  * Oneal Grout - Fellow Staff:  Circulator: Marylou Flesher, RN  Scrub Person: Talitha Givens, RT  Documenter: Awilda Bill, RN; Tanja Port, RN  Team C: TEAM C    Pre-Procedure Diagnosis Post-Procedure Diagnosis  Pre-op Diagnosis  * Cardiac arrest (CMS/HCC) [I46.9]  * Acute HFrEF (heart failure with reduced ejection fraction) (CMS/HCC) [I50.21]  Abnormal stress test 1. Right dominant circulation  2. Severe  coronary disease involving the proximal LAD, LCX (CTO), RCA, and RPLB (CTO)  3. Normal left heart filling pressure  4. Elevated systemic arterial pressure    Procedure Details  The risks, benefits, complications, treatment options, and expected outcomes were discussed with the patient. The patient and/or family concurred with the proposed plan, giving informed consent.  Indication(s)  Emylia Latella is an 71 y.o. female who is having a procedure for Abnormal stress test.  Procedures Performed  1. Left heart catheterization  2. Coronary angiography  3. Moderate sedation    Attending MD Pre-Sedation Reassessment  The patient was reassessed for airway, cardiovascular and respiratory status immediately prior to the procedural sedation. Patient remained/remains a candidate for the planned procedural sedation.  Anesthesia/Sedation Fentanyl, Versed, and local Lidocaine.  Sedation Times Event Time In  Sedation Start 1058  Sedation Stop 1120    Access Site(s) SR Access Site: Right radial artery  Estimated Blood Loss Minimal  Drains None.  Specimens None.  Complications  None.  Stents/Implants  None.  Patient Condition At End of Procedure  stable.    PROCEDURAL FINDINGS    Left Heart Catheterization  Left Ventricular Pressure (Systolic/EDP) 109/3 mmHg  Aortic Pressure 190/80 mmHg    Coronary Angiography  Left Main Coronary Artery Large caliber vessel with luminal irregularities.  Ramus Intermedius (RI) Moderate caliber vessel with luminal irregularities.  Left Anterior Descending (LAD) Large caliber vessel that wraps the apex and has 70% proximal stenosis.  First Diagonal (D1) Small caliber vessel with luminal irregularities.  Second Diagonal (D2) Very small caliber vessel.  Left Circumflex (LCX) Large caliber vessel with a CTO after the takeoff of OM1. Distal territory fills by left-to-left collaterals.  First Obtuse Marginal (OM1) Moderate caliber vessel with luminal irregularities.  Second Obtuse Marginal (  OM2) Fills retrograde by left-to-left collaterals. Small-moderate caliber vessel diffuse luminal irregularities.  Right Coronary (RCA) Large caliber dominant vessel with 70% proximal and 80% mid segment stenoses.  Posterior Descending (PDA) Moderate caliber vessel with an early takeoff from the RCA and luminal irregularities.  Posterolateral Branch (PLB) Moderate caliber vessel with a CTO in the proximal segment. Fills by right-to-right collaterals.  Dominance Right.    Impression and Recommendations  1. Right dominant circulation  2. Severe  coronary disease involving the proximal LAD, LCX (CTO), RCA, and RPLB (CTO)  3. Normal left heart filling pressure  4. Elevated systemic arterial pressure    Recommend CT surgery evaluation for CABG and optimization of anti-hypertensive management.    Attending Attestation    I was present for the entire procedure. I have edited the note above to reflect my interpretation of the procedural findings.    MRI:  No results found for this or any previous visit.    Beola Cord, NP    07/06/2022  Electronically signed by Beola Cord, NP  at 07/06/2022  2:20 PM EDT

## 2022-07-11 NOTE — Telephone Encounter (Signed)
Formatting of this note might be different from the original.  ----- Message from Beola Cord, NP sent at 07/09/2022 11:14 AM EDT -----  Please call pt and let her know that renal function worsened slightly, continue to monitor, repeat labs in 1 month  Carolin Marzouk NP-c    Electronically signed by Evlyn Clines, RN at 07/11/2022  8:17 AM EDT

## 2022-07-11 NOTE — Telephone Encounter (Signed)
Formatting of this note might be different from the original.  Message from lab results  Creat 1.44, up from 1/26 (6 months ago)  Electronically signed by Evlyn Clines, RN at 07/11/2022  8:18 AM EDT

## 2022-07-12 DIAGNOSIS — R1011 Right upper quadrant pain: Secondary | ICD-10-CM | POA: Diagnosis not present

## 2022-07-12 DIAGNOSIS — E785 Hyperlipidemia, unspecified: Secondary | ICD-10-CM | POA: Diagnosis not present

## 2022-07-19 NOTE — Assessment & Plan Note (Signed)
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  - on Metfromin and [place]   - protien/.cr normal  Electronically signed by Casimiro Needle, MD at 07/19/2022 10:25 AM MDT

## 2022-07-19 NOTE — Assessment & Plan Note (Signed)
Associated Problem(s): Transient cerebral ischemia  Formatting of this note might be different from the original.  - not an active issue now   Electronically signed by Casimiro Needle, MD at 07/19/2022 10:26 AM MDT

## 2022-07-19 NOTE — Assessment & Plan Note (Signed)
Associated Problem(s): Hypercholesterolemia  Formatting of this note might be different from the original.  - On Atorvastatin   Electronically signed by Casimiro Needle, MD at 07/19/2022 10:26 AM MDT

## 2022-07-19 NOTE — Assessment & Plan Note (Signed)
Associated Problem(s): History of coronary artery bypass grafting  Formatting of this note might be different from the original.  -History of CABG in April 2022  -EF is around 45%  -On all cardiac medications.  -Appears euvolemic on exam  Electronically signed by Casimiro Needle, MD at 07/19/2022 10:27 AM MDT

## 2022-07-19 NOTE — Assessment & Plan Note (Signed)
Associated Problem(s): Gastroesophageal reflux disease  Formatting of this note might be different from the original.  -Continue pantoprazole  Electronically signed by Casimiro Needle, MD at 07/19/2022 10:27 AM MDT

## 2022-07-19 NOTE — Assessment & Plan Note (Signed)
Associated Problem(s): Acute systolic heart failure (CMS-HCC)  Formatting of this note might be different from the original.  -EF is around 45%  -Status post CABG in April 2022 [facility]  -On Entresto , spironolactone, Jardiance  Electronically signed by Casimiro Needle, MD at 07/19/2022 10:29 AM MDT

## 2022-07-19 NOTE — Assessment & Plan Note (Signed)
Associated Problem(s): Chronic kidney disease stage 3 (CMS-HCC)  Formatting of this note might be different from the original.  -Has a baseline CKD stage III  -Suspect on the basis of diabetic kidney disease and hypertension as well as hyperlipidemia and cardiomyopathy  -Patient's baseline creatinine seems to be around 1.2-1.5.  -Patient's creatinine in 2021 was as high as 2.3.  -GFR seems to be fractured anywhere between 51-37   -All secondary work up negative except elevated Kappa/lambda ratio  - will refer to hematology  - Renal US b/l small kidney and has MRD changes   - renal artery doppler was negative   -Patient will do all this testing and return back to the clinic in 4 weeks  Electronically signed by Casimiro Needle, MD at 07/19/2022 10:29 AM MDT

## 2022-07-19 NOTE — Assessment & Plan Note (Signed)
Associated Problem(s): Cardiac arrest (CMS-HCC)  Formatting of this note might be different from the original.  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

## 2022-07-19 NOTE — Progress Notes (Signed)
Formatting of this note is different from the original.  Images from the original note were not included.  Primary Care Physician: Bobbie Stack, MD  Referring Physician: No care team member to display  Date of consult:  07/19/22    Chief Complaint:   Chief Complaint   Patient presents with    CKD       Assessment/Plan:  Assessment/Plan   Diagnoses and all orders for this visit:  Type 2 diabetes mellitus with diabetic neuropathy, not otherwise specified (CMS-HCC)  Transient cerebral ischemia, not otherwise specified  Hypercholesterolemia, not otherwise specified  History of coronary artery bypass grafting  Gastroesophageal reflux disease  Essential hypertension  Chronic kidney disease stage 3 (CMS-HCC)  Acute systolic heart failure (CMS-HCC)  Femoral popliteal occlusion (CMS-HCC)  Cardiac arrest, not otherwise specified (CMS-HCC)    Problem Specific Notes:  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  -Appears euvolemic on exam    Gastroesophageal reflux disease  -Continue pantoprazole    Essential hypertension  - continue carvedilol/ entresto and Aldactone     Chronic kidney disease stage 3 (CMS-HCC)  -Has a baseline CKD stage III  -Suspect on the basis of diabetic kidney disease and hypertension as well as hyperlipidemia and cardiomyopathy  -Patient's baseline creatinine seems to be around 1.2-1.5.  -Patient's creatinine in 2021 was as high as 2.3.  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 Orrin Brigham    MIPS Measures requiring follow-up  Blood Pressure for this visit is 118/74. The follow up plan to address blood pressure is Home blood pressure checks.  The patient should return for BP check in 4 weeks.            Today's Medication Changes          Accurate as of July 19, 2022 12:15 PM. If you have any questions, ask your nurse or doctor.             Modified Meds      carvedilol 6.25 MG tablet  Commonly known as: COREG  Take 6.25 mg by mouth in the morning and 6.25 mg in the evening. Take with meals.  What changed: Another medication with the same name was removed. Continue taking this medication, and follow the directions you see here.  Changed by: Casimiro Needle, MD          Discontinued      pantoprazole 20 MG  EC tablet  Commonly known as: PROTONIX  Stopped by: Casimiro Needle, MD            Things you can do to help slow the progression of kidney disease:     Follow up in office. Take medications as prescribed.     Strict BP control: The goal BP < 130/80.  If no proteinuria or over the age of 20, goal BP < 140/90.     Strict DM control: Goal A1C < 7, which is a blood sugar average of 150 or less.     Avoid NSAIDs (Ibuprofen, Aleve, Advil, Motrin, Naproxen, Celebrex). Take Tylenol or Tylenol Arthritis if needed for aches/ pains.     Avoid OTC or herbal medications without talking to [place] first.     Regular exercise, 30 min/ day, most days of the week.     Low Sodium diet (< 2000 mg/day) helps with edema and BP control.     No smoking/ alcohol/ drugs.     Life style modifications if edema is  present:  Elevate lower extremities throughout the day, Low sodium diet, Compression stockings. Any worsening shortness of breath or edema, contact us. Any SOB at rest seek ER care.     Future Appointments  4 weeks   History of Present Illness:  Lindsey Huynh is a 71 y.o. female presenting with CKD 3 evaluation.  Patient has been referred to our office by nurse practitioner Ermalinda Barrios and we sincerely appreciate this referral.    Patient has a history of coronary disease status post CABG, hypertension, peripheral vascular disease on both legs,.  Patient recently had blood work done which showed a creatinine of around 1.5 with a GFR of 37.  Due to low GFR she is been referred to our office.  Hence this consultation is done.    Labs June 2023 creatinine 1.5/GFR 37 February 17, 2022 creatinine 1.2/GFR 51  Feb 09, 2022 creatinine 1.45/GFR 38  December 2021 creatinine 1.3/GFR 52    Pt comes for follow up. All secondary work up negative except for elevated Kapp/lam ratio. Cr at 1.4. No protien in urine. No Sob. No other complains. Had renal US and Doppler and was negative.    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 UCHMANOWICZ    CATARACT EXTRACTION Right     COLONOSCOPY  2019    CORONARY ARTERY BYPASS GRAFT  12/25/2020    X2    FEMORAL BYPASS Right 02/16/2022    DR.GREGG LONDREY    FEMORAL BYPASS Left 11/28/2019    DR.Canyon    TUMOR REMOVAL  06/30/2010    TUMOR REMOVAL UNDER SPINAL CORD     does not have any pertinent problems on file.    Social and Family  History:  Family History   Problem Relation Age of Onset    Heart failure Mother     Hypertension Mother     Diabetes Father     Hypertension Father     Thyroid cancer Sister     Heart attack Sister     Heart disease Sister     Seizure Brother      VENNA BERBERICH  reports that she quit smoking about 20 years ago. Her smoking use included cigarettes. She has a 5.00 pack-year smoking history. She has been exposed to tobacco smoke. She has never used smokeless tobacco. She reports current alcohol use of about 3.0 standard drinks of alcohol per week. She reports that she does not use drugs.     Medications:    Current Outpatient Medications:     aspirin (ST JOSEPH) 81 MG EC tablet, Take 81 mg by mouth 1 (one) time each day, Disp: , Rfl:     atorvastatin (LIPITOR) 20 MG tablet, Take 20 mg by mouth 1 (one) time each day, Disp: , Rfl:     carvedilol (COREG) 6.25 MG tablet, Take 6.25 mg by mouth in the morning and 6.25 mg in the evening. Take with meals., Disp: , Rfl:     Empagliflozin (Jardiance) 10 MG tablet, Take 10 mg by mouth in the morning., Disp: , Rfl:     furosemide (LASIX) 20 MG tablet, Take 20 mg by mouth 1 (one) time each day, Disp: , Rfl:     gabapentin (NEURONTIN) 300 MG capsule, Take 300 mg by mouth at night if needed, Disp: , Rfl:     metFORMIN (GLUCOPHAGE) 1000 MG tablet, Take 1,000 mg by mouth 1 (one) time each day, Disp: , Rfl:     sacubitril-valsartan (Entresto) 49-51 MG per tablet, Take 1 tablet by mouth in the morning and 1 tablet in the evening., Disp: , Rfl:     spironolactone (ALDACTONE) 50 MG tablet, Take 50 mg by mouth 1 (one) time each day, Disp: , Rfl:     Today's Medication Changes          Accurate as of July 19, 2022 12:15 PM. If you have any questions, ask your nurse or doctor.             Modified Meds      carvedilol 6.25 MG  tablet  Commonly known as: COREG  Take 6.25 mg by mouth in the morning and 6.25 mg in the evening. Take with meals.  What changed: Another medication with the same name was removed. Continue taking this medication, and follow the directions you see here.  Changed by: Casimiro Needle, MD          Discontinued      pantoprazole 20 MG EC tablet  Commonly known as: PROTONIX  Stopped by: Casimiro Needle, MD          Allergies:  BELISA EICHHOLZ is allergic to neuromuscular blocking agents.    Review of Systems:  Review of Systems   Constitutional:  Negative for appetite change, chills, diaphoresis, fatigue, fever and unexpected weight change.   HENT:  Negative for congestion, ear pain and hearing loss.    Eyes:  Negative for redness.   Respiratory:  Negative for apnea, chest tightness, shortness of breath, wheezing and stridor.    Cardiovascular:  Negative for chest pain, palpitations and leg swelling.   Gastrointestinal:  Negative for abdominal pain, blood in stool, diarrhea, nausea and vomiting.   Genitourinary:  Negative for decreased urine volume, difficulty urinating, dysuria, frequency, hematuria and urgency.   Musculoskeletal:  Negative for arthralgias, joint swelling and myalgias.   Skin:  Negative for pallor, rash and wound.   Neurological:  Negative for tremors, seizures, syncope, weakness, light-headedness and headaches.   Hematological: Negative.    Psychiatric/Behavioral:  Negative for confusion, hallucinations and sleep disturbance. The patient is not nervous/anxious.      Physical Exam:  BP 118/74 (BP Location: Right arm)   Pulse (!) 47   Temp 97.1 F (36.2 C)   SpO2 99%   BMI 29.97 kg/m    Physical Exam  Vitals reviewed.   Constitutional:       Appearance: Normal appearance. She is well-developed.   HENT:      Head: Normocephalic and atraumatic.      Nose: Nose normal.      Mouth/Throat:      Mouth: Mucous membranes are moist.      Pharynx: Oropharynx is clear.   Eyes:      Extraocular Movements:  Extraocular movements intact.      Conjunctiva/sclera: Conjunctivae normal.      Pupils: Pupils are equal, round, and reactive to light.   Cardiovascular:      Rate and Rhythm: Normal rate and regular rhythm.      Pulses: Normal pulses.      Heart sounds: Normal heart sounds. No murmur heard.  Pulmonary:      Effort: Pulmonary effort is normal. No accessory muscle usage or respiratory distress.      Breath sounds: Normal breath sounds. No stridor. No wheezing or rales.   Abdominal:      General: Bowel sounds are normal.      Palpations: Abdomen is soft.   Musculoskeletal:         General: No swelling.      Cervical back: Normal range of motion and neck supple.      Right lower leg: No edema.      Left lower leg: No edema.   Skin:     General: Skin is warm and dry.   Neurological:      General: No focal deficit present.      Mental Status: She is alert and oriented to person, place, and time. Mental status is at baseline.      Motor: Motor function is intact.   Psychiatric:         Mood and Affect: Mood normal.         Behavior: Behavior normal.         Judgment: Judgment normal.     Labs and Imaging:  Labs reviewed with patient in clinic:    Casimiro Needle, MD,FASN  [facility]  [place], [place]   Electronically signed by Casimiro Needle, MD at 07/19/2022 10:37 AM MDT

## 2022-07-19 NOTE — Assessment & Plan Note (Signed)
Associated Problem(s): Essential hypertension  Formatting of this note might be different from the original.  - continue carvedilol/ entresto and Aldactone   Electronically signed by Casimiro Needle, MD at 07/19/2022 10:27 AM MDT

## 2022-07-19 NOTE — Assessment & Plan Note (Signed)
Associated Problem(s): Femoral popliteal occlusion (CMS-HCC)  Formatting of this note might be different from the original.  -History of right femoral-popliteal bypass in June 2023  -History of left femoropopliteal bypass in 2021 by Dr. Orrin Brigham  Electronically signed by Casimiro Needle, MD at 07/19/2022 10:30 AM MDT

## 2022-09-05 NOTE — Telephone Encounter (Signed)
Formatting of this note might be different from the original.  Chartspan Message:    Lindsey Huynh DOB [birth year] has been waiting for an appointment to [facility] and has not received her appointment. We just wanted to update the provider, please let us know if we can be of further assistance. Thank you- Janna Arch, Provider Relations   Electronically signed by Caroll Rancher, MA at 09/05/2022  1:09 PM MST

## 2022-09-28 ENCOUNTER — Telehealth: Payer: Self-pay | Admitting: Hematology and Oncology

## 2022-09-28 NOTE — Telephone Encounter (Signed)
Called patient to r/s 3/7 appointment due to provider PAL. Patient r/s and notified.

## 2022-09-29 NOTE — Progress Notes (Signed)
Formatting of this note is different from the original.    Outpatient Follow Up Visit    Name: Lindsey Huynh Patient ID: [id number]  Age: 72 y.o. Sex: female Date of Birth: Sep 28, 1950   Author: Jaymes Graff, MD    Reason for Presentation: FU CAD, HTN    ASSESSMENT AND PLAN  Coronary disease--currently stable on present medications.      Chronic systolic heart failure--minimal heart failure symptoms, currently class II.  Continue enstresto and other medications    DM--controlled on present medications. Cont Jardiance.    CKD stage 3--is being followed up by renal.     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 female with a PMH of coronary disease with bypass surgery April 7124, chronic systolic heart failure, prior cardiac arrest, hypertension and diabetes with increased cholesterol who returns to clinic for follow-up.       Since her last visit she has been stable.   She reports having intermittent chest pain which she relates to her scar from her bypass surgery; no angina.   Plavix stopped at 1 year currently on low-dose aspirin.    She has no significant shortness of breath. Can walk 1-2 blocks before getting SOB. Can go up a flight of steps without problem.     Has some lower extremity edema especially on the right which she relates to prior PAD surgery. She sleeps on 3 pillows at night but does not wake up shortness of breath.  She intermittently gets around with a cane to help improve her balance. Today came in without it and did fine walking, although slowly    Remains on [place] for heart failure.  Echo previously was 40-45%.  Repeat echo July 2023 showed LVH with LVEF 40-45% with inferior AK. Mild MR    Blood pressures been improved at home with systolics typically in the 120s to 130s.  She denies dizziness or lightheadedness, palpitations, rapid or slow heartbeats.    She reports her glucoses are well controlled on present  medications.  HgbA1C was increased at last check by her primary care MD, was up to 7.7% in May 2023      Takes gabapentin intermittently for her neuropathy on her feet.    Renal function worsened on serial checks.  BUN/creatinine was 28/1.0 in September 2022 worsening to 44/1.20 December 2021; most recently was 39/1.5 with GRF of 38 last check late last year. Is now FU with renal. Referred to oncology due to potential for MUGS. Has an appt tomorrow.    Last chol check in 202 was TC of 160, HDL of 59 and LDL of 90. Currently on 20 of atorvastatin, that was previously 80 mg. unclear why the dose reduction. No myalgias by report. LFTs in the past have been normal.     Complete Review of Systems: As per HPI; all other systems negative    Medications:  Current Outpatient Medications   Medication Instructions   ? acetaminophen (TYLENOL) 650 mg, Oral, Every 8 hours PRN   ? aspirin 81 mg, Oral, Daily   ? atorvastatin (LIPITOR) 80 mg, Oral, Daily   ? atorvastatin (LIPITOR) 20 mg, Oral, Daily   ? carvedilol (COREG) 6.25 mg, Oral, 2 times daily with meals   ? empagliflozin (JARDIANCE) 10 mg, Oral, Daily   ? furosemide (LASIX) 20 mg, Oral, As needed   ? gabapentin (NEURONTIN) 100 mg, Oral, 3 times daily   ?  metFORMIN XR (GLUCOPHAGE-XR) 1,000 mg, Oral, Daily with evening meal, Do not crush, chew, or split.     ? sacubitril-valsartan (Entresto) 49-51 mg tablet 1 tablet, Oral, 2 times daily   ? spironolactone (ALDACTONE) 25 mg, Oral, Daily     Past Medical History:    Diagnosis Date   ? Essential hypertension 10/29/2020   ? Hypertension    ? Type 2 diabetes mellitus, with long-term current use of insulin (CMS/HCC)      Social History:  She reports that she has never smoked. She has never used smokeless tobacco. She reports current alcohol use. She reports that she does not currently use drugs.    Physical Exam  Last Recorded Vitals:  Visit Vitals  BP 116/80 (BP Location: Left arm)   Pulse 70   Wt 74.8 kg   SpO2 100%   BMI 28.31 kg/m    OB Status Hysterectomy   Smoking Status Never   BSA 1.84 m     Constitutional:  Not in acute distress.  Eyes:  No scleral icterus.  Cardiovascular:  regular rate and regular rhythm.    Normal heart sounds.  No murmur  Pulmonary:     No respiratory distress.  No wheezes, rales or accessory muscle use  Abdominal:  No distension.  Soft; no organomegaly. No abdominal tenderness.    Extremeties:  No edema  Skin: warm; no breakdown.   Neurological: intact strength    DATA REVIEWED BY ME:   LABS  Lab Results   Component Value Date    NA 139 07/06/2022    K 4.3 07/06/2022    CL 106 07/06/2022    CO2 23 07/06/2022    BUN 33 (H) 07/06/2022    CREATININE 1.44 (H) 07/06/2022    CALCIUM 9.5 07/06/2022    ALBUMIN 3.9 12/28/2020    PROT 5.3 (L) 12/28/2020    BILITOT 0.5 12/28/2020    ALKPHOS 90 12/28/2020    ALT <6 12/28/2020    AST 16 12/28/2020    MG 1.9 07/06/2022    GLUCOSE 172 (H) 07/06/2022     Lab Results   Component Value Date    WBC 9.8 01/01/2021    HGB 10.7 (L) 01/01/2021    HCT 34.0 (L) 01/01/2021    PLT 273 01/01/2021    NEUTOPHILPCT 52.8 12/09/2020    LYMPHOPCT 26.1 12/09/2020    MONOPCT 8.3 12/09/2020    EOSPCT 11.9 12/09/2020     Lab Results   Component Value Date    CHOL 160 06/07/2021    TRIG 70 06/07/2021    HDL 56 06/07/2021     Lab Results   Component Value Date    APTT 48 (H) 12/31/2020    INR 1.2 12/31/2020     Lab Results   Component Value Date    HGBA1C 6.9 (H) 12/09/2020     Lab Results   Component Value Date    TSH 3.35 10/26/2020    FREET4 1.46 10/30/2018     Lab Results   Component Value Date    PROBNP 293 (H) 07/06/2022    PROBNP 304 (H) 01/03/2022    PROBNP 752 (H) 02/10/2021    PROBNP 614 (H) 11/04/2020    BNP 239 (H) 10/26/2020     IMAGING AND OTHER STUDIES    X-ray:  Enc. date: 01/07/21    XR CHEST 2 VIEWS    Narrative  STUDY:PA / Lateral Chest    HISTORY: I25.10: Atherosclerotic heart disease of native  coronary artery without angina pectoris.    COMPARISONS: January 01, 2021.    FINDINGS/    Impression  Heart and mediastinum:  There is cardiomegaly.    Lungs: There are low lung volumes. There is collapse/consolidation of most of the left lower lobe. There is asymmetric elevation of the left hemidiaphragm.    Pleura: Small to moderate left pleural effusion is suspected. Trace right pleural effusion is present.  There is no pneumothorax.    Dictated By: Maudie Flakes  Electronically Verified by: Regino Bellow A Fadl 01/11/2021 10:31 AM    Echo/Echo Stress:  Enc. date: 04/13/22    TRANSTHORACIC ECHO (TTE) COMPLETE    Narrative  [facility]                      +------------+  +--------------------------+      Center                        :            :  :                          : [address]                   :            :  :                          [address]                        :            :  [place], [place]                     :            :  :                          :      [id number]                         :            :  +--------------------------+ Phone: [phone]                    [phone]  [phone]    Transthoracic Echocardiography Report  +-----------------------------------------------------------------------------+  :Name: Lindsey Huynh         Study Date: 04/13/2022 01:46 PM             :  :Attending Physician: Suzi Roots                           Accession#: [id number]                      :  :  MRN: [id number]                    Patient Location: Heart Station - OP        :  :DOB: 12-11-1950                  Gender: Female                              :  :Age: 51 yrs                      BP: 152/89 mmHg                             :  :Height: 64 in                    Weight: 174 lb                              :  :                                                                             :  :BSA: 1.8 m2                                                                   :  :Heart Rate: 63                                                               :  :Reason For Study: Acute systolic                                             :  :CHF                                                                          :  :History: Acute CHF                                                           :  +-----------------------------------------------------------------------------+  PROCEDURE  Procedure(CPT Code): TTE Complete (66440-34) 2D with Doppler and Color Flow:  No add on codes required). A injection of Lumason contrast agent was performed  to improve image quality. A total of 4 cc of contrast was given.    Interpretation Summary  Quality adequate  1. Concentric LV remodeling with mildly depressed LV systolic function.  Estimated LVEF 40 to 45%. 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 Diastolic dysfunction with normal left atrial pressures  3. Normal RV size and systolic function  4. Biatrial sizes normal  5. Mild mitral regurgitation. Sclerotic cardiac valves  6. Normal size aortic root  7. Normal IVC size and estimated right atrial pressure    LEFT VENTRICLE  Normal left ventricular cavity size. There is concentric left ventricular  remodeling. LV ejection fraction = 40-45%. There is mild left ventricular  diastolic dysfunction with normal left atrial pressure. Akinesis of the  inferolateral wall and hypokinesis inferior wall with a small aneurysmal area  in the basal inferior wall.    RIGHT VENTRICLE  The right ventricle is normal size. The right ventricular systolic function is  normal.    LEFT ATRIUM  The left atrial size is normal.    RIGHT ATRIUM  Right atrial size is normal.    AORTIC VALVE  The aortic valve is trileaflet. There is mild aortic valve thickening. There  is no aortic stenosis. There is no aortic regurgitation.    MITRAL VALVE  There are mildly thickened mitral valve leaflets.  There is mild mitral  regurgitation.    TRICUSPID VALVE  Structurally normal tricuspid valve. There is trivial tricuspid regurgitation.  There is insufficient tricuspid regurgitation to estimate right ventricular  systolic pressure.    PULMONIC VALVE  Structurally normal pulmonic valve. Trace pulmonic valvular regurgitation.    ARTERIES  The aortic root is normal size. Maximal visible dimension is 3.2 cm. The  proximal ascending aorta appears normal. The transverse aorta is not well  visualized. The proximal descending aorta is not well visualized. The  pulmonary artery is not well visualized.    VENOUS  The inferior vena cava is normal in size.    EFFUSION  Insignificant pericardial effusion or subepicardial fat.  +-----------------------------------------------------------------------------+  :Normal Values                                                                :  :IVSd: 0.7cm - 1.2cm  LVIDd: 3.5cm - 5.5cm        LVIDs: 2.5cm - 4.0cm        :  :LVPWd: 0.7cm - 1.1cm LA: 1.9cm - 3.8cm           Ao: 2.0cm - 3.7cm           :  :EF: (55 - 75%)       LA Area: <>                 RA Area: <>                 :  :RVd: 4.3cm           LV Mass(Men): <>  LV Mass(Women): <>          :  :                     LV Mass Index(Men): <116g>  LV Mass Index(Women): <96g> :  +-----------------------------------------------------------------------------+  MMode/2D Measurements & Calculations  IVSd: 0.98 cm        LVIDd: 4.2 cm          LV mass(C)d: 145.7 grams  LVIDs: 3.8 cm          LV mass(C)dI: 78.8 grams/m2  LVPWd: 1.1 cm    ______________________________________________________________________________  Ao root diam: 3.3 cm asc Aorta Diam: 3.6 cm LA/Ao: 0.96  LA dimension: 3.1 cm                        LVOT diam: 2.1 cm    ______________________________________________________________________________  EDV(MOD-sp4):        LVLd ap2: 7.9 cm       TAPSE: 1.2 cm  106.0 ml             EDV(MOD-sp2): 112.0 ml  LVLs ap4: 7.0  cm     LVLs ap2: 7.4 cm  ESV(MOD-sp4): 68.0 mlESV(MOD-sp2): 59.1 ml  EF(MOD-sp4): 35.8 %  EF(MOD-sp2): 47.2 %    ______________________________________________________________________________  MR Jet Area_: 4.0 cm2RA ESA: 11.6 cm2       RV Base: 3.1 cm    ______________________________________________________________________________  LA A4Cs: 17.4 cm2    LA ESV (MOD-BP):       LA volume MOD BP Indexed:  45.6 ml                24.6 ml/m2    Time Measurements  Aortic R-R: 1.1 sec  Aortic HR: 57.0 BPM    Doppler Measurements & Calculations  MV E max vel: 55.8 cm/sec MV dec slope: 135.4 cm/sec2  Ao V2 max: 92.3 cm/sec  MV A max vel: 77.1 cm/sec MV dec time: 0.41 sec        Ao max PG: 3.0 mmHg  MV E/A: 0.72                                           AVA(V,D): 2.9 cm2    ______________________________________________________________________________  LV V1 max PG: 2.0 mmHg    CO(LVOT): 3.5 l/min          AV VR: 0.85  LV V1 mean PG: 1.3 mmHg   SV(LVOT): 61.4 ml  LV V1 max: 78.8 cm/sec  LV V1 mean: 52.9 cm/sec  LV V1 VTI: 18.2 cm    ______________________________________________________________________________  MV P1/2t-pr_: 120.7 msec  RV S Vel: 7.3 cm/sec         MV LAT E': 7.1 cm/sec    ______________________________________________________________________________  MV LAT E/E': 7.9          MV MED E': 4.1 cm/sec        MV MED E/E': 13.5    ______________________________________________________________________________  Electronically Signed By:  Jeni Salles  on 04/13/2022 08:16 PM  Performed By: Lucianne Lei  MRN: [id number]    Cath:  Enc. date: 11/26/20    CARDIAC CATHETERIZATION    Narrative  Table formatting from the original result was not included.  Patient Name: Lindsey Huynh  Patient DOB: 1951/09/13 Patient MRN: [id number]  Procedure Date: 11/26/20 Procedure Time: 1478  Service: OUTPATIENT CATH Location: [facility]  MAIN CARDIAC CATH  Physicians/Surgeons:  * Edwyna Shell - Primary  * Oneal Grout - Fellow Staff:  Circulator:  Marylou Flesher, RN  Scrub Person: Talitha Givens, RT  Documenter: Awilda Bill, RN; Tanja Port, RN  Team C: TEAM C    Pre-Procedure Diagnosis Post-Procedure Diagnosis  Pre-op Diagnosis  * Cardiac arrest (CMS/HCC) [I46.9]  * Acute HFrEF (heart failure with reduced ejection fraction) (CMS/HCC) [I50.21]  Abnormal stress test 1. Right dominant circulation  2. Severe  coronary disease involving the proximal LAD, LCX (CTO), RCA, and RPLB (CTO)  3. Normal left heart filling pressure  4. Elevated systemic arterial pressure    Procedure Details  The risks, benefits, complications, treatment options, and expected outcomes were discussed with the patient. The patient and/or family concurred with the proposed plan, giving informed consent.  Indication(s)  Lindsey Huynh is an 72 y.o. female who is having a procedure for Abnormal stress test.  Procedures Performed  1. Left heart catheterization  2. Coronary angiography  3. Moderate sedation    Attending MD Pre-Sedation Reassessment  The patient was reassessed for airway, cardiovascular and respiratory status immediately prior to the procedural sedation. Patient remained/remains a candidate for the planned procedural sedation.  Anesthesia/Sedation Fentanyl, Versed, and local Lidocaine.  Sedation Times Event Time In  Sedation Start 1058  Sedation Stop 1120    Access Site(s) SR Access Site: Right radial artery  Estimated Blood Loss Minimal  Drains None.  Specimens None.  Complications None.  Stents/Implants  None.  Patient Condition At End of Procedure  stable.    PROCEDURAL FINDINGS    Left Heart Catheterization  Left Ventricular Pressure (Systolic/EDP) 008/6 mmHg  Aortic Pressure 190/80 mmHg    Coronary Angiography  Left Main Coronary Artery Large caliber vessel with luminal irregularities.  Ramus Intermedius (RI) Moderate caliber vessel with luminal irregularities.  Left Anterior Descending (LAD) Large caliber vessel that wraps the apex and has 70% proximal  stenosis.  First Diagonal (D1) Small caliber vessel with luminal irregularities.  Second Diagonal (D2) Very small caliber vessel.  Left Circumflex (LCX) Large caliber vessel with a CTO after the takeoff of OM1. Distal territory fills by left-to-left collaterals.  First Obtuse Marginal (OM1) Moderate caliber vessel with luminal irregularities.  Second Obtuse Marginal (OM2) Fills retrograde by left-to-left collaterals. Small-moderate caliber vessel diffuse luminal irregularities.  Right Coronary (RCA) Large caliber dominant vessel with 70% proximal and 80% mid segment stenoses.  Posterior Descending (PDA) Moderate caliber vessel with an early takeoff from the RCA and luminal irregularities.  Posterolateral Branch (PLB) Moderate caliber vessel with a CTO in the proximal segment. Fills by right-to-right collaterals.  Dominance Right.    Impression and Recommendations  1. Right dominant circulation  2. Severe  coronary disease involving the proximal LAD, LCX (CTO), RCA, and RPLB (CTO)  3. Normal left heart filling pressure  4. Elevated systemic arterial pressure    Recommend CT surgery evaluation for CABG and optimization of anti-hypertensive management.    Attending Attestation    I was present for the entire procedure. I have edited the note above to reflect my interpretation of the procedural findings.    MRI:  No results found for this or any previous visit.    CT:  Enc. date: 10/26/20    CT HEAD WO CONTRAST    Narrative  STUDY: CT OF THE HEAD WITHOUT IV CONTRAST    HISTORY: post cardiac arrest    TECHNIQUE: CT imaging was performed through the head without IV contrast.  Axial, sagittal, and coronal images are provided.    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 There is no abnormal density in the brain parenchyma and gray-white differentiation is maintained. There is no mass-effect or midline  shift. There is no acute intracranial hemorrhage. The size, shape and configuration of the ventricles and sulci are unremarkable. Basal cisterns are clear.    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 The paranasal sinuses and mastoid air cells are clear. No fracture is demonstrated. Endotracheal and orogastric tubes are seen.    Impression  Final report  No acute intracranial abnormality.  Chronic changes as above.    Dictated By: Laroy Apple 10/26/2020 10:10 PM    I have reviewed the images and dictated, reviewed or edited the final report    Dictated By: Laroy Apple  Electronically Verified by: Ahmet Baykal 10/27/2020 6:02 AM    Nuclear Medicine:  No results found for this or any previous visit.    Stress Testing:  No results found for this or any previous visit.      Electronically signed by Jaymes Graff, MD at 09/29/2022  5:59 PM EST

## 2022-09-30 DIAGNOSIS — H26493 Other secondary cataract, bilateral: Secondary | ICD-10-CM | POA: Diagnosis not present

## 2022-09-30 DIAGNOSIS — H52223 Regular astigmatism, bilateral: Secondary | ICD-10-CM | POA: Diagnosis not present

## 2022-09-30 DIAGNOSIS — H5201 Hypermetropia, right eye: Secondary | ICD-10-CM | POA: Diagnosis not present

## 2022-09-30 DIAGNOSIS — Z961 Presence of intraocular lens: Secondary | ICD-10-CM | POA: Diagnosis not present

## 2022-09-30 DIAGNOSIS — H04551 Acquired stenosis of right nasolacrimal duct: Secondary | ICD-10-CM | POA: Diagnosis not present

## 2022-09-30 DIAGNOSIS — H5212 Myopia, left eye: Secondary | ICD-10-CM | POA: Diagnosis not present

## 2022-09-30 DIAGNOSIS — H53143 Visual discomfort, bilateral: Secondary | ICD-10-CM | POA: Diagnosis not present

## 2022-09-30 DIAGNOSIS — H04201 Unspecified epiphora, right lacrimal gland: Secondary | ICD-10-CM | POA: Diagnosis not present

## 2022-10-05 ENCOUNTER — Encounter: Payer: MEDICARE | Attending: Student in an Organized Health Care Education/Training Program | Primary: Family

## 2022-10-05 NOTE — Progress Notes (Unsigned)
Lindsey Huynh (DOB:  [birth year]) is a 72 y.o. female, New patient, here for evaluation of the following chief complaint(s):  No chief complaint on file.        Subjective   SUBJECTIVE/OBJECTIVE:  Patient presents today to establish care with me    {PCP:64750}    Acute concerns:   ***    Chronic problems:  ***    Preventative care:  Health Maintenance Due   Topic Date Due    Diabetic retinal exam  Never done    DTaP/Tdap/Td vaccine (1 - Tdap) Never done    Colorectal Cancer Screen  Never done    Breast cancer screen  Never done    Shingles vaccine (1 of 2) Never done    Respiratory Syncytial Virus (RSV) Pregnant or age 81 yrs+ (1 - 1-dose 60+ series) Never done    Lipids  08/21/2021    Flu vaccine (1) Never done    COVID-19 Vaccine (3 - 2023-24 season) 05/20/2022    Annual Wellness Visit (Medicare Advantage)  Never done          Past Medical History:   Diagnosis Date    Arthritis     CAD (coronary artery disease)     Cardiac arrest (HCC) 11/12/2020    Formatting of this note might be different from the original.   Added automatically from request for surgery [phone]  Formatting of this note might be different from the original.   Formatting of this note might be different from the original.   Added automatically from request for surgery [id number]      Last Assessment & Plan:    Formatting of this note might be different from the original.   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   -History of right femoral-popliteal bypass in June 2023   -History of left femoropopliteal bypass in 2021 by Dr. 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   Had been  downtrending in the hospital. Likely 2/2 her PEA arrest      Last Assessment & Plan:    Formatting of this note might be different from the original.   Hepatic panel on 2/11: Alt 156, alk phos 177   CMP on 2/16: ALT 56, alk phos 143   Both down trending   -Recheck CMP on 12/16/20  Formatting of this note might be different fro    PAD (peripheral artery disease) (HCC)     Pneumonia due to COVID-19 virus 10/29/2020    Formatting of this note might be different from the original.   She is s/p dexamethasone and remdesevir.      Last Assessment & Plan:    Formatting of this note might be different from the original.   Encouraged patient to get her booster. She stated she would call [facility] now and get it scheduled    Pneumonia due to COVID-19 virus 10/29/2020    Formatting of this note might be different from the original.   Formatting of this note might be  different from the original.   She is s/p dexamethasone and remdesevir.      Last Assessment & Plan:    Formatting of this note might be different from the original.   Encouraged patient to get her booster. She stated she would call [facility] now and get it scheduled    Stage 2 acute kidney injury (HCC) 10/29/2020    Last Assessment & Plan:    Formatting of this note might be different from the original.   Lasix today to keep negative   AKI resolving  Formatting of this note might be different from the original.   Last Assessment & Plan:    Formatting of this note might be different from the original.   Lasix today to keep negative   AKI resolving      Last Assessment & Plan:    Formatting of this note might b     Past Surgical History:   Procedure Laterality Date    COLONOSCOPY  2019    CORONARY ARTERY BYPASS GRAFT  12/25/2020    x2, [facility]     CORONARY ARTERY BYPASS GRAFT  12/25/2020    EYE SURGERY Right     CATARACT REMOVAL    FEMORAL BYPASS Left 11/28/2019     Dr Cathren Harsh    FEMORAL BYPASS Right 02/16/2022    RIGHT FEMORAL POPLITEAL BYPASS WITH PTFE,  ANGIOPLASTY OF POPLITEAL AND TIBIAL ARTERIES performed by Ulyses Amor, MD at [facility] MAIN OR    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 Shirlee More    HYSTERECTOMY (CERVIX STATUS UNKNOWN)  1983    ORTHOPEDIC SURGERY  06-30-10    back surgery (tumor removed)    TUMOR REMOVAL  06/30/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      Family History   Problem Relation Age of Onset    Heart Disease Mother         CHF    High Blood Pressure Mother     High Blood Pressure Father     Diabetes Father     Heart Disease Sister     Heart Attack Sister     Cancer Sister         THYROID    Seizures Brother     Anesth Problems Neg Hx      Social History     Socioeconomic History    Marital status: Single     Spouse name: Not on file    Number of children: Not on file    Years of education: Not on file    Highest education level: Not on file   Occupational History    Not on file   Tobacco Use    Smoking status: Former     Current packs/day: 0.00     Average packs/day: 0.5 packs/day for 10.0 years (5.0 ttl pk-yrs)     Types: Cigarettes     Start date: 85     Quit date: 2003     Years since quitting: 21.0     Passive exposure: Current    Smokeless tobacco: Never   Vaping Use    Vaping Use: Never used   Substance and Sexual Activity    Alcohol use: Yes     Alcohol/week: 3.0 standard drinks of alcohol     Types: 1 Cans of beer, 2 Shots of liquor per week     Comment: SOCIALLY    Drug use: No  Sexual activity: Not on file   Other Topics Concern    Not on file   Social History Narrative    Not on file     Social Determinants of Health     Financial Resource Strain: Low Risk  (03/01/2022)    Overall Financial Resource Strain (CARDIA)     Difficulty of Paying Living Expenses: Not hard at all   Food Insecurity: Not on file (03/01/2022)   Transportation Needs: Unknown (03/01/2022)    PRAPARE - Therapist, art (Medical): Not on file     Lack of Transportation (Non-Medical): No   Physical Activity: Not  on file   Stress: Not on file   Social Connections: Not on file   Intimate Partner Violence: Not on file   Housing Stability: Unknown (03/01/2022)    Housing Stability Vital Sign     Unable to Pay for Housing in the Last Year: Not on file     Number of Places Lived in the Last Year: Not on file     Unstable Housing in the Last Year: No       Current Outpatient Medications:     atorvastatin (LIPITOR) 80 MG tablet, Take 1 tablet by mouth Daily with supper, Disp: , Rfl:     carvedilol (COREG) 6.25 MG tablet, Take 1 tablet by mouth 2 times daily (with meals), Disp: , Rfl:     spironolactone (ALDACTONE) 50 MG tablet, TAKE 1 TABLET BY MOUTH DAILY, Disp: 90 tablet, Rfl: 3    sacubitril-valsartan (ENTRESTO) 49-51 MG per tablet, Take 1 tablet by mouth 2 times daily, Disp: , Rfl:     furosemide (LASIX) 20 MG tablet, Take 1 tablet by mouth as needed WEIGHT GAIN, Disp: , Rfl:     gabapentin (NEURONTIN) 300 MG capsule, Take 1 capsule by mouth nightly as needed., Disp: , Rfl:     aspirin 81 MG EC tablet, Take by mouth daily, Disp: , Rfl:     empagliflozin (JARDIANCE) 10 MG tablet, Take by mouth daily, Disp: , Rfl:     metFORMIN (GLUCOPHAGE-XR) 500 MG extended release tablet, Take 2 tablets by mouth Daily with supper, Disp: , Rfl:     pantoprazole (PROTONIX) 20 MG tablet, ceived the following from [facility]: Outside name: pantoprazole (PROTONIX) 20 mg tablet, Disp: , Rfl:   No Known Allergies    Review of Systems       Objective   There were no vitals filed for this visit.   Physical Exam              ASSESSMENT/PLAN:  1. Acquired absence of left foot (HCC)  2. Type II diabetes mellitus with complication (HCC)  3. Type 2 diabetes, controlled, with neuropathy (HCC)  4. PVD (peripheral vascular disease) (HCC)  5. Stage 3 chronic kidney disease, unspecified whether stage 3a or 3b CKD (HCC)  6. Chronic systolic heart failure (HCC)  7. Primary hypertension  8. Hypercholesterolemia  9. Screening for colorectal  cancer  10. Encounter for screening mammogram for breast cancer  11. Encounter for immunization         Patient presents for establish care visit with me.  Acute concerns addressed.  Chronic problems reviewed.  Medications and history reviewed.  See below for additional information.      No follow-ups on file.       {Time Documentation Optional:[id number]}  An electronic signature was used to authenticate this note.    --  Unk Lightning, MD

## 2022-10-07 DIAGNOSIS — Z23 Encounter for immunization: Secondary | ICD-10-CM | POA: Diagnosis not present

## 2022-10-14 DIAGNOSIS — Z961 Presence of intraocular lens: Secondary | ICD-10-CM | POA: Diagnosis not present

## 2022-10-14 DIAGNOSIS — H26491 Other secondary cataract, right eye: Secondary | ICD-10-CM | POA: Diagnosis not present

## 2022-10-17 NOTE — Assessment & Plan Note (Signed)
Associated Problem(s): Transient cerebral ischemia  Formatting of this note might be different from the original.  - not an active issue now   Electronically signed by Casimiro Needle, MD at 10/17/2022  4:50 PM MST

## 2022-10-17 NOTE — Assessment & Plan Note (Signed)
Associated Problem(s): History of coronary artery bypass grafting  Formatting of this note might be different from the original.  -History of CABG in April 2022  -EF is around 45%  -On all cardiac medications.  -Appears euvolemic on exam  Electronically signed by Casimiro Needle, MD at 10/17/2022  4:50 PM MST

## 2022-10-17 NOTE — Assessment & Plan Note (Signed)
Associated Problem(s): Essential hypertension  Formatting of this note might be different from the original.  - continue carvedilol/ entresto and Aldactone   Electronically signed by Casimiro Needle, MD at 10/17/2022  4:51 PM MST

## 2022-10-17 NOTE — Assessment & Plan Note (Signed)
Associated Problem(s): Gastroesophageal reflux disease  Formatting of this note might be different from the original.  -Continue pantoprazole  Electronically signed by Casimiro Needle, MD at 10/17/2022  4:50 PM MST

## 2022-10-17 NOTE — Assessment & Plan Note (Signed)
Associated Problem(s): Chronic kidney disease stage 3 (CMS-HCC)  Formatting of this note might be different from the original.  -Has a baseline CKD stage III  -Suspect on the basis of diabetic kidney disease and hypertension as well as hyperlipidemia and cardiomyopathy  -Patient's baseline creatinine seems to be around 1.2-1.5.  -Patient's creatinine in 2021 was as high as 2.3.  -GFR seems to be fractured anywhere between 51-37   -All secondary work up negative except elevated Kappa/lambda ratio  -Referred to hematology and discussed with my nurse to get her an appointment  - Renal US b/l small kidney and has MRD changes   - renal artery doppler was negative   -Her most recent creatinine was elevated at 1.44 with a GFR of 34.  Will obtain additional labs today along with urine studies.  -If she has significant proteinuria she may end up needing a kidney biopsy  -She has been also referred to hematology for possibly MDS evaluation  Electronically signed by Casimiro Needle, MD at 10/17/2022  4:51 PM MST

## 2022-10-17 NOTE — Progress Notes (Signed)
Formatting of this note is different from the original.  Primary Care Physician: Bobbie Stack, MD  Referring Physician: No care team member to display  Date of consult:  10/17/22    Chief Complaint:   No chief complaint on file.      Assessment/Plan:  Assessment/Plan   Diagnoses and all orders for this visit:  Type 2 diabetes mellitus with diabetic neuropathy, not otherwise specified (CMS-HCC)  Transient cerebral ischemia, not otherwise specified  Hypercholesterolemia, not otherwise specified  History of coronary artery bypass grafting  Cataract of left eye  Gastroesophageal reflux disease  Chronic kidney disease stage 3 (CMS-HCC)  Essential hypertension  Acute systolic heart failure (CMS-HCC)    Problem Specific Notes:  Type 2 diabetes mellitus with diabetic neuropathy (CMS-HCC)  - on Metfromin and Jardiane   - protien/.cr normal    Transient cerebral ischemia  - not an active issue now     History of coronary artery bypass grafting  -History of CABG in April 2022  -EF is around 45%  -On all cardiac medications.  -Appears euvolemic on exam    Gastroesophageal reflux disease  -Continue pantoprazole    Essential hypertension  - continue carvedilol/ entresto and Aldactone     Chronic kidney disease stage 3 (CMS-HCC)  -Has a baseline CKD stage III  -Suspect on the basis of diabetic kidney disease and hypertension as well as hyperlipidemia and cardiomyopathy  -Patient's baseline creatinine seems to be around 1.2-1.5.  -Patient's creatinine in 2021 was as high as 2.3.  -GFR seems to be fractured anywhere between 51-37   -All secondary work up negative except elevated Kappa/lambda ratio  -Referred to hematology and discussed with my nurse to get her an appointment  - Renal US b/l small kidney and has MRD changes   - renal artery doppler was negative   -Her most recent creatinine was elevated at 1.44 with a GFR of 34.  Will obtain additional labs today along with urine studies.  -If she has significant proteinuria she may  end up needing a kidney biopsy  -She has been also referred to hematology for possibly MDS evaluation    Cardiac arrest (CMS-HCC)  And has history of cardiac arrest in January 2020, PEA arrest    Acute systolic heart failure (CMS-HCC)  -EF is around 45%  -Status post CABG in April 2022 [facility]  -On Entresto , spironolactone, Jardiance as well as Lasix  -If the creatinine continues to elevated we may need to discontinue her Lasix    Acute kidney failure stage 2 (CMS-HCC)  -Has history of AKA with creatinine as high as 2.3 and 30 2022  -Creatinine seems to be established around 1.41.5    MIPS Measures requiring follow-up  Blood Pressure for this visit is  . The follow up plan to address blood pressure is Home blood pressure checks.  The patient should return for BP check in 4 weeks.            Things you can do to help slow the progression of kidney disease:     Follow up in office. Take medications as prescribed.     Strict BP control: The goal BP < 130/80.  If no proteinuria or over the age of 87, goal BP < 140/90.     Strict DM control: Goal A1C < 7, which is a blood sugar average of 150 or less.     Avoid NSAIDs (Ibuprofen, Aleve, Advil, Motrin, Naproxen, Celebrex). Take Tylenol or Tylenol Arthritis if  needed for aches/ pains.     Avoid OTC or herbal medications without talking to [place] first.     Regular exercise, 30 min/ day, most days of the week.     Low Sodium diet (< 2000 mg/day) helps with edema and BP control.     No smoking/ alcohol/ drugs.     Life style modifications if edema is present:  Elevate lower extremities throughout the day, Low sodium diet, Compression stockings. Any worsening shortness of breath or edema, contact us. Any SOB at rest seek ER care.     Future Appointments  4 weeks   History of Present Illness:  Lindsey Huynh is a 72 y.o. female presenting with CKD 3 evaluation.  Patient has been referred to our office by nurse practitioner Ermalinda Barrios and we sincerely appreciate this  referral.    Patient has a history of coronary disease status post CABG, hypertension, peripheral vascular disease on both legs,.  Patient recently had blood work done which showed a creatinine of around 1.5 with a GFR of 37.  Due to low GFR she is been referred to our office.  Hence this consultation is done.    Labs June 2023 creatinine 1.5/GFR 37 February 17, 2022 creatinine 1.2/GFR 51  Feb 09, 2022 creatinine 1.45/GFR 38  December 2021 creatinine 1.3/GFR 52    Pt comes for follow up. All secondary work up negative except for elevated Kapp/lam ratio. Cr at 1.4. No protien in urine. No Sob. No other complains. Had renal US and Doppler and was negative.  She was referred to hematology.  However she has not gone to the hematology appointment.  Requested my nurse to get an appointment.  Her last creatinine was 1.44.  I requested to get labs done today.  She has really no other additional complaints to offer  Past Medical History:  Past Medical History:   Diagnosis Date    Arthropathy, unspecified, site unspecified     Chronic kidney disease     Congestive heart failure (CMS-HCC)     Coronary atherosclerosis of unspecified type of vessel, native or graft     Diabetes mellitus without mention of complication, type II or unspecified type, not stated as uncontrolled (CMS-HCC)     Disorder of digestive system     Essential hypertension     Other and unspecified hyperlipidemia     Peripheral vascular disease (CMS-HCC)      Past Surgical History:   Procedure Laterality Date    AMPUTATION FOOT / TOE Left 12/05/2019    TMA 2/2 LEFT GREAT TOE DR. UCHMANOWICZ    CATARACT EXTRACTION Right     COLONOSCOPY  2019    CORONARY ARTERY BYPASS GRAFT  12/25/2020    X2    FEMORAL BYPASS Right 02/16/2022    DR.GREGG LONDREY    FEMORAL BYPASS Left 11/28/2019    DR.[place]    TUMOR REMOVAL  06/30/2010    TUMOR REMOVAL UNDER SPINAL CORD     does not have any pertinent problems on file.    Social and Family History:  Family  History   Problem Relation Age of Onset    Heart failure Mother     Hypertension Mother     Diabetes Father     Hypertension Father     Thyroid cancer Sister     Heart attack Sister     Heart disease Sister     Seizure Brother  SAMYIAH HALVORSEN  reports that she quit smoking about 21 years ago. Her smoking use included cigarettes. She has a 5.00 pack-year smoking history. She has been exposed to tobacco smoke. She has never used smokeless tobacco. She reports current alcohol use of about 3.0 standard drinks of alcohol per week. She reports that she does not use drugs.     Medications:    Current Outpatient Medications:     aspirin ([facility] JOSEPH) 81 MG EC tablet, Take 81 mg by mouth 1 (one) time each day, Disp: , Rfl:     atorvastatin (LIPITOR) 20 MG tablet, Take 20 mg by mouth 1 (one) time each day, Disp: , Rfl:     carvedilol (COREG) 6.25 MG tablet, Take 6.25 mg by mouth in the morning and 6.25 mg in the evening. Take with meals., Disp: , Rfl:     Empagliflozin (Jardiance) 10 MG tablet, Take 10 mg by mouth in the morning., Disp: , Rfl:     furosemide (LASIX) 20 MG tablet, Take 20 mg by mouth 1 (one) time each day, Disp: , Rfl:     gabapentin (NEURONTIN) 300 MG capsule, Take 300 mg by mouth at night if needed, Disp: , Rfl:     metFORMIN (GLUCOPHAGE) 1000 MG tablet, Take 1,000 mg by mouth 1 (one) time each day, Disp: , Rfl:     sacubitril-valsartan (Entresto) 49-51 MG per tablet, Take 1 tablet by mouth in the morning and 1 tablet in the evening., Disp: , Rfl:     spironolactone (ALDACTONE) 50 MG tablet, Take 50 mg by mouth 1 (one) time each day, Disp: , Rfl:     Allergies:  LATACHA TEXEIRA is allergic to neuromuscular blocking agents.    Review of Systems:  Review of Systems   Constitutional:  Negative for appetite change, chills, diaphoresis, fatigue, fever and unexpected weight change.   HENT:  Negative for congestion, ear pain and hearing loss.    Eyes:  Negative for redness.   Respiratory:  Negative for  apnea, chest tightness, shortness of breath, wheezing and stridor.    Cardiovascular:  Negative for chest pain, palpitations and leg swelling.   Gastrointestinal:  Negative for abdominal pain, blood in stool, diarrhea, nausea and vomiting.   Genitourinary:  Negative for decreased urine volume, difficulty urinating, dysuria, frequency, hematuria and urgency.   Musculoskeletal:  Negative for arthralgias, joint swelling and myalgias.   Skin:  Negative for pallor, rash and wound.   Neurological:  Negative for tremors, seizures, syncope, weakness, light-headedness and headaches.   Hematological: Negative.    Psychiatric/Behavioral:  Negative for confusion, hallucinations and sleep disturbance. The patient is not nervous/anxious.      Physical Exam:  There were no vitals taken for this visit.   Physical Exam  Vitals reviewed.   Constitutional:       Appearance: Normal appearance. She is well-developed.   HENT:      Head: Normocephalic and atraumatic.      Nose: Nose normal.      Mouth/Throat:      Mouth: Mucous membranes are moist.      Pharynx: Oropharynx is clear.   Eyes:      Extraocular Movements: Extraocular movements intact.      Conjunctiva/sclera: Conjunctivae normal.      Pupils: Pupils are equal, round, and reactive to light.   Cardiovascular:      Rate and Rhythm: Normal rate and regular rhythm.      Pulses: Normal pulses.  Heart sounds: Normal heart sounds. No murmur heard.  Pulmonary:      Effort: Pulmonary effort is normal. No accessory muscle usage or respiratory distress.      Breath sounds: Normal breath sounds. No stridor. No wheezing or rales.   Abdominal:      General: Bowel sounds are normal.      Palpations: Abdomen is soft.   Musculoskeletal:         General: No swelling.      Cervical back: Normal range of motion and neck supple.      Right lower leg: No edema.      Left lower leg: No edema.   Skin:     General: Skin is warm and dry.   Neurological:      General: No focal deficit present.       Mental Status: She is alert and oriented to person, place, and time. Mental status is at baseline.      Motor: Motor function is intact.   Psychiatric:         Mood and Affect: Mood normal.         Behavior: Behavior normal.         Judgment: Judgment normal.     Labs and Imaging:  Labs reviewed with patient in clinic:    Casimiro Needle, MD,FASN  [facility] [place], [place]   Electronically signed by Casimiro Needle, MD at 10/17/2022  4:53 PM MST

## 2022-10-17 NOTE — Assessment & Plan Note (Signed)
Associated Problem(s): Acute systolic heart failure (CMS-HCC)  Formatting of this note might be different from the original.  -EF is around 45%  -Status post CABG in April 2022 [facility]  -On Entresto , spironolactone, Jardiance as well as Lasix  -If the creatinine continues to elevated we may need to discontinue her Lasix  Electronically signed by Casimiro Needle, MD at 10/17/2022  4:52 PM MST

## 2022-10-17 NOTE — Assessment & Plan Note (Signed)
Associated Problem(s): Acute kidney failure stage 2 (CMS-HCC)  Formatting of this note might be different from the original.  -Has history of AKA with creatinine as high as 2.3 and 30 2022  -Creatinine seems to be established around 1.41.5  Electronically signed by Casimiro Needle, MD at 10/17/2022  4:52 PM MST

## 2022-10-17 NOTE — Assessment & Plan Note (Signed)
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  - on Metfromin and [place]   - protien/.cr normal  Electronically signed by Casimiro Needle, MD at 10/17/2022  4:50 PM MST

## 2022-10-17 NOTE — Assessment & Plan Note (Signed)
Associated Problem(s): Cardiac arrest (CMS-HCC)  Formatting of this note might be different from the original.  And has history of cardiac arrest in January 2020, PEA arrest  Electronically signed by Casimiro Needle, MD at 10/17/2022  4:52 PM MST

## 2022-10-21 DIAGNOSIS — H5212 Myopia, left eye: Secondary | ICD-10-CM | POA: Diagnosis not present

## 2022-10-21 DIAGNOSIS — Z961 Presence of intraocular lens: Secondary | ICD-10-CM | POA: Diagnosis not present

## 2022-10-21 DIAGNOSIS — Z9841 Cataract extraction status, right eye: Secondary | ICD-10-CM | POA: Diagnosis not present

## 2022-10-21 DIAGNOSIS — H52223 Regular astigmatism, bilateral: Secondary | ICD-10-CM | POA: Diagnosis not present

## 2022-10-21 DIAGNOSIS — H5201 Hypermetropia, right eye: Secondary | ICD-10-CM | POA: Diagnosis not present

## 2022-10-26 ENCOUNTER — Ambulatory Visit: Payer: MEDICARE | Attending: Student in an Organized Health Care Education/Training Program | Primary: Family

## 2022-10-26 NOTE — Progress Notes (Unsigned)
Lindsey Huynh (DOB:  [birth year]) is a 72 y.o. female, New patient, here for evaluation of the following chief complaint(s):  No chief complaint on file.    Established to practice. New patient to this provider.    Subjective   SUBJECTIVE/OBJECTIVE:  Patient presents today to establish care with me    Patient does have prior PCP  Patient's prior PCP is Carolan Shiver, APRN - NP    Acute concerns:   ***    Chronic problems:  ***    Preventative care:  Health Maintenance Due   Topic Date Due    Diabetic retinal exam  Never done    DTaP/Tdap/Td vaccine (1 - Tdap) Never done    Colorectal Cancer Screen  Never done    Breast cancer screen  Never done    Shingles vaccine (1 of 2) Never done    Respiratory Syncytial Virus (RSV) Pregnant or age 38 yrs+ (1 - 1-dose 60+ series) Never done    Lipids  08/21/2021    Flu vaccine (1) Never done    COVID-19 Vaccine (3 - 2023-24 season) 05/20/2022    Annual Wellness Visit (Medicare Advantage)  Never done          Past Medical History:   Diagnosis Date    Arthritis     CAD (coronary artery disease)     Cardiac arrest (HCC) 11/12/2020    Formatting of this note might be different from the original.   Added automatically from request for surgery [phone]  Formatting of this note might be different from the original.   Formatting of this note might be different from the original.   Added automatically from request for surgery [id number]      Last Assessment & Plan:    Formatting of this note might be different from the original.   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   -History of right femoral-popliteal bypass in June 2023   -History of left femoropopliteal bypass in 2021 by Dr. Loletha Grayer    GERD (gastroesophageal reflux disease)     Hypercholesterolemia      Hypertension     Ischemic hepatitis 10/29/2020    Formatting of this note might be different from the original.   Had been downtrending in the hospital. Likely 2/2 her PEA arrest      Last Assessment & Plan:    Formatting of this note might be different from the original.   Hepatic panel on 2/11: Alt 156, alk phos 177   CMP on 2/16: ALT 56, alk phos 143   Both down trending   -Recheck CMP on 12/16/20  Formatting of this note might be different fro    PAD (peripheral artery disease) (HCC)     Pneumonia due to COVID-19 virus 10/29/2020    Formatting of this note might be different from the original.   She is s/p dexamethasone and remdesevir.      Last Assessment & Plan:    Formatting of this note might be different from the original.   Encouraged patient to get her booster. She stated she would call [facility] now and get it scheduled    Pneumonia due to COVID-19 virus 10/29/2020  Formatting of this note might be different from the original.   Formatting of this note might be different from the original.   She is s/p dexamethasone and remdesevir.      Last Assessment & Plan:    Formatting of this note might be different from the original.   Encouraged patient to get her booster. She stated she would call [facility] now and get it scheduled    Stage 2 acute kidney injury (HCC) 10/29/2020    Last Assessment & Plan:    Formatting of this note might be different from the original.   Lasix today to keep negative   AKI resolving  Formatting of this note might be different from the original.   Last Assessment & Plan:    Formatting of this note might be different from the original.   Lasix today to keep negative   AKI resolving      Last Assessment & Plan:    Formatting of this note might b     Past Surgical History:   Procedure Laterality Date    COLONOSCOPY  2019    CORONARY ARTERY BYPASS GRAFT  12/25/2020    x2, [facility]     CORONARY ARTERY BYPASS GRAFT  12/25/2020    EYE SURGERY Right     CATARACT REMOVAL     FEMORAL BYPASS Left 11/28/2019     Dr Cathren Harsh    FEMORAL BYPASS Right 02/16/2022    RIGHT FEMORAL POPLITEAL BYPASS WITH PTFE, ANGIOPLASTY OF POPLITEAL AND TIBIAL ARTERIES performed by Ulyses Amor, MD at [facility] MAIN OR    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 Shirlee More    HYSTERECTOMY (CERVIX STATUS UNKNOWN)  1983    ORTHOPEDIC SURGERY  06-30-10    back surgery (tumor removed)    TUMOR REMOVAL  06/30/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      Family History   Problem Relation Age of Onset    Heart Disease Mother         CHF    High Blood Pressure Mother     High Blood Pressure Father     Diabetes Father     Heart Disease Sister     Heart Attack Sister     Cancer Sister         THYROID    Seizures Brother     Anesth Problems Neg Hx      Social History     Socioeconomic History    Marital status: Single     Spouse name: Not on file    Number of children: Not on file    Years of education: Not on file    Highest education level: Not on file   Occupational History    Not on file   Tobacco Use    Smoking status: Former     Current packs/day: 0.00     Average packs/day: 0.5 packs/day for 10.0 years (5.0 ttl pk-yrs)     Types: Cigarettes     Start date: 89     Quit date: 2003     Years since quitting: 21.1     Passive exposure: Current    Smokeless tobacco: Never   Vaping Use    Vaping Use: Never used   Substance and Sexual Activity    Alcohol use: Yes     Alcohol/week: 3.0 standard drinks of alcohol     Types: 1 Cans of beer, 2 Shots  of liquor per week     Comment: SOCIALLY    Drug use: No    Sexual activity: Not on file   Other Topics Concern    Not on file   Social History Narrative    Not on file     Social Determinants of Health     Financial Resource Strain: Low Risk  (03/01/2022)    Overall Financial Resource Strain (CARDIA)     Difficulty of Paying Living Expenses: Not hard at all   Food Insecurity: Not on file (03/01/2022)   Transportation Needs: Unknown (03/01/2022)    PRAPARE -  Therapist, art (Medical): Not on file     Lack of Transportation (Non-Medical): No   Physical Activity: Not on file   Stress: Not on file   Social Connections: Not on file   Intimate Partner Violence: Not on file   Housing Stability: Unknown (03/01/2022)    Housing Stability Vital Sign     Unable to Pay for Housing in the Last Year: Not on file     Number of Places Lived in the Last Year: Not on file     Unstable Housing in the Last Year: No       Current Outpatient Medications:     atorvastatin (LIPITOR) 80 MG tablet, Take 1 tablet by mouth Daily with supper, Disp: , Rfl:     carvedilol (COREG) 6.25 MG tablet, Take 1 tablet by mouth 2 times daily (with meals), Disp: , Rfl:     spironolactone (ALDACTONE) 50 MG tablet, TAKE 1 TABLET BY MOUTH DAILY, Disp: 90 tablet, Rfl: 3    sacubitril-valsartan (ENTRESTO) 49-51 MG per tablet, Take 1 tablet by mouth 2 times daily, Disp: , Rfl:     furosemide (LASIX) 20 MG tablet, Take 1 tablet by mouth as needed WEIGHT GAIN, Disp: , Rfl:     gabapentin (NEURONTIN) 300 MG capsule, Take 1 capsule by mouth nightly as needed., Disp: , Rfl:     aspirin 81 MG EC tablet, Take by mouth daily, Disp: , Rfl:     empagliflozin (JARDIANCE) 10 MG tablet, Take by mouth daily, Disp: , Rfl:     metFORMIN (GLUCOPHAGE-XR) 500 MG extended release tablet, Take 2 tablets by mouth Daily with supper, Disp: , Rfl:     pantoprazole (PROTONIX) 20 MG tablet, ceived the following from [facility]: Outside name: pantoprazole (PROTONIX) 20 mg tablet, Disp: , Rfl:   No Known Allergies    Review of Systems       Objective   There were no vitals filed for this visit.   Physical Exam              ASSESSMENT/PLAN:  {There are no diagnoses linked to this encounter. (Refresh or delete this SmartLink)}       Patient presents for establish care visit with me.  Acute concerns addressed.  Chronic problems reviewed.  Medications and history reviewed.  See below for additional  information.      No follow-ups on file.       {Time Documentation Optional:[id number]}  An electronic signature was used to authenticate this note.    --Unk Lightning, MD

## 2022-11-04 DIAGNOSIS — E785 Hyperlipidemia, unspecified: Secondary | ICD-10-CM | POA: Diagnosis not present

## 2022-11-04 DIAGNOSIS — E559 Vitamin D deficiency, unspecified: Secondary | ICD-10-CM | POA: Diagnosis not present

## 2022-11-04 DIAGNOSIS — D500 Iron deficiency anemia secondary to blood loss (chronic): Secondary | ICD-10-CM | POA: Diagnosis not present

## 2022-11-04 DIAGNOSIS — Z1212 Encounter for screening for malignant neoplasm of rectum: Secondary | ICD-10-CM | POA: Diagnosis not present

## 2022-11-04 DIAGNOSIS — H26492 Other secondary cataract, left eye: Secondary | ICD-10-CM | POA: Diagnosis not present

## 2022-11-04 DIAGNOSIS — D509 Iron deficiency anemia, unspecified: Secondary | ICD-10-CM | POA: Diagnosis not present

## 2022-11-04 DIAGNOSIS — K219 Gastro-esophageal reflux disease without esophagitis: Secondary | ICD-10-CM | POA: Diagnosis not present

## 2022-11-04 DIAGNOSIS — R7989 Other specified abnormal findings of blood chemistry: Secondary | ICD-10-CM | POA: Diagnosis not present

## 2022-11-07 ENCOUNTER — Telehealth: Payer: Self-pay | Admitting: Hematology and Oncology

## 2022-11-07 NOTE — Telephone Encounter (Signed)
Per 2/16 IB reached out to patient to reschedule; patient is going to leave appointment as is for now.

## 2022-11-08 DIAGNOSIS — Z1212 Encounter for screening for malignant neoplasm of rectum: Secondary | ICD-10-CM | POA: Diagnosis not present

## 2022-11-08 DIAGNOSIS — D500 Iron deficiency anemia secondary to blood loss (chronic): Secondary | ICD-10-CM | POA: Diagnosis not present

## 2022-11-10 ENCOUNTER — Inpatient Hospital Stay (HOSPITAL_BASED_OUTPATIENT_CLINIC_OR_DEPARTMENT_OTHER): Payer: Medicare Other | Admitting: Hematology and Oncology

## 2022-11-10 ENCOUNTER — Inpatient Hospital Stay: Payer: Medicare Other | Attending: Hematology and Oncology

## 2022-11-10 ENCOUNTER — Other Ambulatory Visit: Payer: Self-pay | Admitting: Hematology and Oncology

## 2022-11-10 VITALS — BP 112/60 | HR 69 | Temp 98.0°F | Resp 13 | Wt 159.2 lb

## 2022-11-10 DIAGNOSIS — Z803 Family history of malignant neoplasm of breast: Secondary | ICD-10-CM | POA: Diagnosis not present

## 2022-11-10 DIAGNOSIS — C8253 Diffuse follicle center lymphoma, intra-abdominal lymph nodes: Secondary | ICD-10-CM

## 2022-11-10 DIAGNOSIS — Z95828 Presence of other vascular implants and grafts: Secondary | ICD-10-CM

## 2022-11-10 DIAGNOSIS — Z808 Family history of malignant neoplasm of other organs or systems: Secondary | ICD-10-CM | POA: Diagnosis not present

## 2022-11-10 DIAGNOSIS — Z8572 Personal history of non-Hodgkin lymphomas: Secondary | ICD-10-CM | POA: Diagnosis not present

## 2022-11-10 LAB — CBC WITH DIFFERENTIAL (CANCER CENTER ONLY)
Abs Immature Granulocytes: 0.01 10*3/uL (ref 0.00–0.07)
Basophils Absolute: 0.1 10*3/uL (ref 0.0–0.1)
Basophils Relative: 1 %
Eosinophils Absolute: 0.2 10*3/uL (ref 0.0–0.5)
Eosinophils Relative: 3 %
HCT: 42.2 % (ref 36.0–46.0)
Hemoglobin: 14.2 g/dL (ref 12.0–15.0)
Immature Granulocytes: 0 %
Lymphocytes Relative: 32 %
Lymphs Abs: 1.9 10*3/uL (ref 0.7–4.0)
MCH: 30.8 pg (ref 26.0–34.0)
MCHC: 33.6 g/dL (ref 30.0–36.0)
MCV: 91.5 fL (ref 80.0–100.0)
Monocytes Absolute: 0.4 10*3/uL (ref 0.1–1.0)
Monocytes Relative: 7 %
Neutro Abs: 3.3 10*3/uL (ref 1.7–7.7)
Neutrophils Relative %: 57 %
Platelet Count: 196 10*3/uL (ref 150–400)
RBC: 4.61 MIL/uL (ref 3.87–5.11)
RDW: 12.8 % (ref 11.5–15.5)
WBC Count: 5.8 10*3/uL (ref 4.0–10.5)
nRBC: 0 % (ref 0.0–0.2)

## 2022-11-10 LAB — CMP (CANCER CENTER ONLY)
ALT: 18 U/L (ref 0–44)
AST: 21 U/L (ref 15–41)
Albumin: 4 g/dL (ref 3.5–5.0)
Alkaline Phosphatase: 81 U/L (ref 38–126)
Anion gap: 5 (ref 5–15)
BUN: 13 mg/dL (ref 8–23)
CO2: 30 mmol/L (ref 22–32)
Calcium: 8.9 mg/dL (ref 8.9–10.3)
Chloride: 107 mmol/L (ref 98–111)
Creatinine: 0.84 mg/dL (ref 0.44–1.00)
GFR, Estimated: >60 mL/min (ref >60)
Glucose, Bld: 103 mg/dL — ABNORMAL HIGH (ref 70–99)
Potassium: 4.2 mmol/L (ref 3.5–5.1)
Sodium: 142 mmol/L (ref 135–145)
Total Bilirubin: 0.4 mg/dL (ref 0.3–1.2)
Total Protein: 6.2 g/dL — ABNORMAL LOW (ref 6.5–8.1)

## 2022-11-10 LAB — LACTATE DEHYDROGENASE: LDH: 147 U/L (ref 98–192)

## 2022-11-10 NOTE — Progress Notes (Signed)
[facility] [phone]   [phone]  PROGRESS NOTE  Patient Care Team: Prince Solian, MD as PCP - General (Internal Medicine) Mcarthur Rossetti, MD as Consulting Physician (Orthopedic Surgery)  Hematological/Oncological History # Non-Hodgkin B Cell Lymphoma, Follicular Lymphoma. Stage III 1) 02/25/2020: patient underwent excision of a 1.3 cm mobile nodule on the left vertex of the scalp. 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  2) 03/18/2020: Establish care with Dr. Lorenso Courier  3) 03/30/2020: PET CT scan showed  involvement of the left scalp, left axillary lymph node, left hilar and infrahilar nodes, subcarinal node, and mesenteric lymph nodes, as well as a masslike appearance/ high metabolic activity in the lower uterus/cervix.  4) 04/21/2020: biopsy of the mass in the lower uterus/cervix consistent with follicular lymphoma.  5) 06/25/2020: CT C/A/P showed the soft tissue mass in the abdomen increase in size from 5.8 x 3.6 cm to 6.5 x 5.7 cm. The other lymphadenopathy was stable. Patient elected to move forward with treatment.  6) 07/06/2020: Cycle 1 Day 1 of Rituximab/Bendamustine 7) 07/17/2020-07/22/2020: Admitted to [facility] with fevers. Infectious workup negative, but scans show mesenteric panniculitis (fibrosing mesenteritis). Started on prednisone 35m PO daily.  8) 08/06/2020-08/27/2020: Weekly Rituximab monotherapy.  9) 09/28/2020: PET CT scan shows no residual disease. Complete response to therapy noted.  10) 10/23/2020: start of rituximab q 8 weeks for consolidation.  11) 12/25/2020: dose 2 of q 8 week rituximab  12) 02/26/2021: dose 3 of q 8 week rituximab  13) 04/28/2021: dose 4 of q 8 week rituximab  14) 05/26/2022: CT chest abdomen pelvis showed no evidence of residual or recurrent disease.  Interval History:  Alyssa WILLCUTT72y.o. female with medical history significant for  diffuse non Hodgkin B cell lymphoma who presents for a follow up visit. The patient's last visit was on 05/26/2022. In the interim since her last visit she has had no major changes in her health. She presents today for a routine surveillance visit.   On exam today Ms. RLuchettiis accompanied by her husband.  She reports she has been well overall interim since her last visit.  Her energy levels are good and her appetite is strong.  She reports that she has managed to avoid having any viral infections in the interim since her last visit.  She denies any RSV, flu, or COVID.  She reports that she recently started weight watchers and is intentionally losing weight.  She is down about 3.8 pounds with a goal of losing a total of 20.  She notes that she has not noticed any lymphadenopathy with bumps or lumps on her neck.  Her energy is quite strong and she currently ranks it as about a 10 out of 10.  She notes that overall she is at her baseline level of health and feels quite well.  She denies having any issues with fevers, chills, sweats, nausea, vomiting or diarrhea.She otherwise has no, questions or concerns.  A full 10 point ROS is listed below.  MEDICAL HISTORY:  Past Medical History:  Diagnosis Date   Allergy    Anxiety    Asthma    Cataract 2018   removed   Complication of anesthesia    drop in BP   DDD (degenerative disc disease)    Depression    GERD (gastroesophageal reflux disease)    Hemorrhoids    Hyperlipidemia    [place] 2021   no tx  for now   Vitamin D deficiency     SURGICAL HISTORY: Past Surgical History:  Procedure Laterality Date   ATRIAL ABLATION SURGERY  1997   SVT   cataract surgery Bilateral 2017   COLONOSCOPY  2016   FOOT NEUROMA SURGERY     multiple times bilaterally   IR IMAGING GUIDED PORT INSERTION  07/13/2020   IR REMOVAL TUN ACCESS W/ PORT W/O FL MOD SED  06/04/2021   PLANTAR FASCIA RELEASE Left 1999 or 2000   SHOULDER ARTHROSCOPY Left 04/19/2019    Procedure: LEFT SHOULDER MANIPULATION UNDER ANESTHESIA AND ARTHROSCOPY WITH EXTENSIVE DEBRIDEMENT;  Surgeon: Mcarthur Rossetti, MD;  Location: [facility];  Service: Orthopedics;  Laterality: Left;   UPPER GI ENDOSCOPY  yrs ago    SOCIAL HISTORY: Social History   Socioeconomic History   Marital status: Married    Spouse name: Not on file   Number of children: 0   Years of education: Not on file   Highest education level: Not on file  Occupational History   Occupation: retired Catering manager  Tobacco Use   Smoking status: Never   Smokeless tobacco: Never  Vaping Use   Vaping Use: Never used  Substance and Sexual Activity   Alcohol use: No   Drug use: No   Sexual activity: Not Currently    Partners: Male    Birth control/protection: Post-menopausal  Other Topics Concern   Not on file  Social History Narrative   Not on file   Social Determinants of Health   Financial Resource Strain: Not on file  Food Insecurity: Not on file  Transportation Needs: Not on file  Physical Activity: Not on file  Stress: Not on file  Social Connections: Not on file  Intimate Partner Violence: Not on file    FAMILY HISTORY: Family History  Problem Relation Age of Onset   Heart disease Mother    Diabetes Mother    Tuberculosis Paternal Grandfather    Bone cancer Paternal Grandfather    Breast cancer Sister 48   Melanoma Sister    Cirrhosis Maternal Grandfather    Heart failure Paternal Grandmother    Colon cancer Neg Hx    Ovarian cancer Neg Hx    Uterine cancer Neg Hx     ALLERGIES:  is allergic to bupropion, codeine, levofloxacin, penicillins, keflex [cephalexin], latex, and sulfa antibiotics.  MEDICATIONS:  Current Outpatient Medications  Medication Sig Dispense Refill   ARIPiprazole (ABILIFY) 2 MG tablet Take 2 mg by mouth daily.      aspirin 81 MG tablet Take 81 mg by mouth at bedtime.      Melatonin 5 MG TABS Take 20 mg by mouth at bedtime.      meloxicam (MOBIC) 15 MG  tablet Take 1 tablet by mouth daily as needed.     pantoprazole (PROTONIX) 40 MG tablet Take 40 mg by mouth 2 (two) times daily.     PARoxetine (PAXIL) 40 MG tablet Take 40 mg by mouth daily.      Rosuvastatin Calcium 20 MG CPSP 1 tablet     No current facility-administered medications for this visit.   Facility-Administered Medications Ordered in Other Visits  Medication Dose Route Frequency Provider Last Rate Last Admin   0.9 %  sodium chloride infusion   Intravenous Continuous Boisseau, Hayley, PA        REVIEW OF SYSTEMS:   Constitutional: ( - ) fevers, ( - )  chills , ( - ) night sweats Eyes: ( - )  blurriness of vision, ( - ) double vision, ( - ) watery eyes Ears, nose, mouth, throat, and face: ( - ) mucositis, ( - ) sore throat Respiratory: ( - ) cough, ( - ) dyspnea, ( - ) wheezes Cardiovascular: ( - ) palpitation, ( - ) chest discomfort, ( - ) lower extremity swelling Gastrointestinal:  ( - ) nausea, ( - ) heartburn, ( - ) change in bowel habits Skin: ( - ) abnormal skin rashes Lymphatics: ( - ) new lymphadenopathy, ( - ) easy bruising Neurological: ( - ) numbness, ( - ) tingling, ( - ) new weaknesses Behavioral/Psych: ( - ) mood change, ( - ) new changes  All other systems were reviewed with the patient and are negative.  PHYSICAL EXAMINATION: GENERAL: well appearing elderly Caucasian female. alert, no distress and comfortable SKIN: scalp lesion reduced to a concave dent in scalp. skin color, texture, turgor are normal, no rashes or significant lesions EYES: conjunctiva are pink and non-injected, sclera clear LYMPH:  no palpable lymphadenopathy in the cervical, axillary or supraclavicular LUNGS: clear to auscultation and percussion with normal breathing effort HEART: regular rate & rhythm and no murmurs and no lower extremity edema Musculoskeletal: no cyanosis of digits and no clubbing  PSYCH: alert & oriented x 3, fluent speech NEURO: no focal motor/sensory  deficits  LABORATORY DATA:  I have reviewed the data as listed    Latest Ref Rng & Units 11/10/2022    2:14 PM 05/26/2022    2:38 PM 12/10/2021    5:47 PM  CBC  WBC 4.0 - 10.5 K/uL 5.8  5.2  7.5   Hemoglobin 12.0 - 15.0 g/dL 14.2  14.8  15.2   Hematocrit 36.0 - 46.0 % 42.2  43.6  44.8   Platelets 150 - 400 K/uL 196  179  204        Latest Ref Rng & Units 11/10/2022    2:14 PM 05/26/2022    2:38 PM 12/10/2021    5:47 PM  CMP  Glucose 70 - 99 mg/dL 103  76  124   BUN 8 - 23 mg/dL 13  15  16   $ Creatinine 0.44 - 1.00 mg/dL 0.84  0.91  0.79   Sodium 135 - 145 mmol/L 142  139  138   Potassium 3.5 - 5.1 mmol/L 4.2  4.0  4.0   Chloride 98 - 111 mmol/L 107  106  103   CO2 22 - 32 mmol/L 30  30  24   $ Calcium 8.9 - 10.3 mg/dL 8.9  9.1  8.6   Total Protein 6.5 - 8.1 g/dL 6.2  6.5  6.7   Total Bilirubin 0.3 - 1.2 mg/dL 0.4  0.5  0.8   Alkaline Phos 38 - 126 U/L 81  95  83   AST 15 - 41 U/L 21  19  34   ALT 0 - 44 U/L 18  17  21     $ No results found for: "MPROTEIN" No results found for: "KPAFRELGTCHN", "LAMBDASER", "KAPLAMBRATIO"   RADIOGRAPHIC STUDIES:  No results found.  ASSESSMENT & PLAN Alyssa Elliott 72 y.o. female with medical history significant for diffuse non Hodgkin B cell lymphoma who presents for a follow up visit.  After review of the imaging, review the labs, discussion with the patient her findings are most consistent with a diffuse non-Hodgkin B-cell lymphoma, follicular lymphoma.  The initial diagnosis based off the biopsy from the lesion on the head was that of  a follicle cell lymphoma which tends to be a cutaneous lymphoma, however this diagnosis was considerably less likely given the diffuse spread throughout the lymph nodes of the chest and abdomen. Biopsy of the FDG avid lesion in the pelvis resulted with follicular lymphoma.   Previously we discussed the diagnosis of follicular lymphoma and the options moving forward.  She currently does not meet any GELF  Criteria and therefore is not required to start therapy.  We discussed that treatment could be started if she so desired and we discussed the risks and benefits of bendamustine rituximab therapy.  She voiced her understanding of the different options between starting treatment and continued observation.  After discussion with her husband she noted she would like to start treatment given the continued growth of the lesion which was abutting the urinary bladder.   The regimen of choice for this patient is Rituximab monotherapy with Rituximab 320m/m2 IV weekly x 4 weeks, followed by IV rituximab q 8 weeks thereafter. This is being administered with curative intent. We transitioned to this regimen from bendamustine rituximab due to intolerance and hospitalization following her first cycle of treatment.  # Non-Hodgkin B Cell Lymphoma, Follicular Lymphoma. Stage III --pre treatment CT findings showed lymphadenopathy on both sides of the diaphragm. Consistent with at least a Stage III lymphoma -- confirmed the diagnosis with biopsy of the FDG avid mass near the uterus/cervix. Cancel the left axillary lymph node biopsy --started treatment with R-Benda, however the patient ended up hospitalized due to fever, weakness, and marked drop in her labs.  She did not tolerate rituximab and bendamustine combination altogether and therefore we proceeded with rituximab monotherapy alone. She completed Rituximab weekly x 4 weeks.  Plan:  --CT imaging recommend q 6 months x 2 years.  Next CT scan due in 6 months time. Last scan on 05/24/2022 showed no evidence of residual/recurrent disease.  Scan currently scheduled for 11/17/2022.  We will call her by phone to discuss the results of the scan. -- Patient has completed maintenance rituximab --labs today show WBC 5.8, Hgb 14.2, MCV 91.5, Plt 196 --RTC in 6 months time   # Leukopenia-resolved. -- Unclear etiology of leukopenia, improved on last 2 visits to normal levels.    #Fibrosing Mesenteritis (Mesenteric Panniculitis), resolved --improved with prednisone taper, complete resolution of symptoms.  --completed steroid taper.  --continue to monitor   #Supportive Care --port removed --zofran [id number] PRN and compazine 1077mO q6H for nausea -- EMLA cream for port -- no pain medication required at this time.   No orders of the defined types were placed in this encounter.  All questions were answered. The patient knows to call the clinic with any problems, questions or concerns.  A total of more than 30 minutes were spent on this encounter and over half of that time was spent on counseling and coordination of care as outlined above.   JohLedell PeoplesD Department of Hematology/Oncology [place] [facility]The [facility] Campusone: [phone]: [phone]: [email or web address]  11/10/2022 4:45 PM

## 2022-11-11 DIAGNOSIS — D126 Benign neoplasm of colon, unspecified: Secondary | ICD-10-CM | POA: Diagnosis not present

## 2022-11-11 DIAGNOSIS — H52223 Regular astigmatism, bilateral: Secondary | ICD-10-CM | POA: Diagnosis not present

## 2022-11-11 DIAGNOSIS — R82998 Other abnormal findings in urine: Secondary | ICD-10-CM | POA: Diagnosis not present

## 2022-11-11 DIAGNOSIS — M25559 Pain in unspecified hip: Secondary | ICD-10-CM | POA: Diagnosis not present

## 2022-11-11 DIAGNOSIS — Z9842 Cataract extraction status, left eye: Secondary | ICD-10-CM | POA: Diagnosis not present

## 2022-11-11 DIAGNOSIS — Z0000 Encounter for general adult medical examination without abnormal findings: Secondary | ICD-10-CM | POA: Diagnosis not present

## 2022-11-11 DIAGNOSIS — J309 Allergic rhinitis, unspecified: Secondary | ICD-10-CM | POA: Diagnosis not present

## 2022-11-11 DIAGNOSIS — Z961 Presence of intraocular lens: Secondary | ICD-10-CM | POA: Diagnosis not present

## 2022-11-11 DIAGNOSIS — K219 Gastro-esophageal reflux disease without esophagitis: Secondary | ICD-10-CM | POA: Diagnosis not present

## 2022-11-11 DIAGNOSIS — J45909 Unspecified asthma, uncomplicated: Secondary | ICD-10-CM | POA: Diagnosis not present

## 2022-11-11 DIAGNOSIS — H5212 Myopia, left eye: Secondary | ICD-10-CM | POA: Diagnosis not present

## 2022-11-11 DIAGNOSIS — C8253 Diffuse follicle center lymphoma, intra-abdominal lymph nodes: Secondary | ICD-10-CM | POA: Diagnosis not present

## 2022-11-11 DIAGNOSIS — H524 Presbyopia: Secondary | ICD-10-CM | POA: Diagnosis not present

## 2022-11-11 DIAGNOSIS — E785 Hyperlipidemia, unspecified: Secondary | ICD-10-CM | POA: Diagnosis not present

## 2022-11-11 DIAGNOSIS — M5030 Other cervical disc degeneration, unspecified cervical region: Secondary | ICD-10-CM | POA: Diagnosis not present

## 2022-11-11 DIAGNOSIS — F329 Major depressive disorder, single episode, unspecified: Secondary | ICD-10-CM | POA: Diagnosis not present

## 2022-11-11 DIAGNOSIS — E559 Vitamin D deficiency, unspecified: Secondary | ICD-10-CM | POA: Diagnosis not present

## 2022-11-11 DIAGNOSIS — H5201 Hypermetropia, right eye: Secondary | ICD-10-CM | POA: Diagnosis not present

## 2022-11-11 DIAGNOSIS — M7502 Adhesive capsulitis of left shoulder: Secondary | ICD-10-CM | POA: Diagnosis not present

## 2022-11-17 ENCOUNTER — Ambulatory Visit (HOSPITAL_COMMUNITY)
Admission: RE | Admit: 2022-11-17 | Discharge: 2022-11-17 | Disposition: A | Discharge status: Home / Self Care | Payer: Medicare Other | Source: Ambulatory Visit | Attending: Hematology and Oncology | Admitting: Hematology and Oncology

## 2022-11-17 DIAGNOSIS — K828 Other specified diseases of gallbladder: Secondary | ICD-10-CM | POA: Diagnosis not present

## 2022-11-17 DIAGNOSIS — C8253 Diffuse follicle center lymphoma, intra-abdominal lymph nodes: Secondary | ICD-10-CM | POA: Diagnosis not present

## 2022-11-17 MED ORDER — SODIUM CHLORIDE (PF) 0.9 % IJ SOLN
INTRAMUSCULAR | Status: AC
  Filled 2022-11-17: qty 50

## 2022-11-17 MED ORDER — IOHEXOL 9 MG/ML PO SOLN
500.0000 mL | ORAL | Status: AC
  Administered 2022-11-17 (×2): 500 mL via ORAL

## 2022-11-17 MED ORDER — IOHEXOL 300 MG/ML  SOLN
100.0000 mL | Freq: Once | INTRAMUSCULAR | Status: AC | PRN
  Administered 2022-11-17: 100 mL via INTRAVENOUS

## 2022-11-17 MED ORDER — IOHEXOL 9 MG/ML PO SOLN
ORAL | Status: AC
Start: 2022-11-17 — End: 2022-11-17
  Filled 2022-11-17: qty 1000

## 2022-11-18 ENCOUNTER — Telehealth: Payer: Self-pay

## 2022-11-18 NOTE — Telephone Encounter (Addendum)
Called patient to advise of message below.  Patient had question about gallbladder thickening on CT scan. Per Dr. Patient states that she is not having any abdominal pain that correlates with the CT scan gallbladder wall thickening.  Instructed patient to call GI MD if she has any abdominal issues. Dr. Lorenso Courier aware of patient's question.  ----- Message from Orson Slick, MD sent at 11/18/2022  7:54 AM EST ----- Please let Alyssa Elliott know that her CT scan showed no evidence of residual or recurrent lymphoma. Overall everything appears stable. We will plan to see her back as scheduled in August 2024.   ----- Message ----- From: Buel Ream, Rad Results In Sent: 11/17/2022   5:18 PM EST To: Orson Slick, MD

## 2022-11-24 ENCOUNTER — Other Ambulatory Visit: Payer: Medicare Other

## 2022-11-24 ENCOUNTER — Ambulatory Visit: Payer: Medicare Other | Admitting: Hematology and Oncology

## 2022-11-24 ENCOUNTER — Encounter: Payer: Self-pay | Admitting: Radiology

## 2022-12-09 NOTE — Telephone Encounter (Signed)
POPULATION HEALTH CLINICAL PHARMACY: ADHERENCE REVIEW  Identified care gap per [place] fills with [facility]: Diabetes adherence    Medicare Advantage - [place]  Per insurer report, LIS-1 - may be able to receive up to a 100-day supply for the same cost as a 30-day supply  Patient also appears to be prescribed: Statin    ASSESSMENT    DIABETES ADHERENCE    Insurance Records claims through  03.11.24  (Prior Year [place] = 99% - PASSED; YTD [place] = 72%; Potential Fail Date: 05.16.24):   JARDIANCE TAB 10 MG last filled on 02.24.24 for 30 day supply. Next refill due: 03.25.24    Prescribed sig: 1 tablet/capsule daily    Per Insurer Portal: last filled on 03.18.24 for 30 day supply.     Per PreCheck a 30 day supply and a 90 day supply have a $11.20 co pay.    Per [facility]: last picked up on 02.24.24 for 30 day supply. Billed through [place]. Pt last filled on 03.18.24 for 30 day supply and READY to pick up. 2 refills remaining.    Lab Results   Component Value Date    LABA1C 7.7 (H) 02/09/2022    LABA1C 8.1 (H) 11/23/2019     STATIN ADHERENCE    Insurance Records claims through  03.11.24  (Prior Year [place]C = 100% - PASSED; YTD PDC = FIRST FILL; Potential Fail Date: 06.20.24):   ATORVASTATIN TAB 80 MG last filled on 01.11.24 for 90 day supply. Next refill due: 04.10.24    Prescribed sig: 1 tablet/capsule daily    Per Insurer Portal: last filled on 01.11.24 for 90 day supply.     Per [facility]: last picked up on 01.11.24 for 90 day supply. Billed through [place]. 3 refills remaining.    Lab Results   Component Value Date    CHOL 253 (H) 08/21/2020    TRIG 117 08/21/2020    HDL 72 08/21/2020    LDLCALC 157.6 (H) 08/21/2020     ALT   Date Value Ref Range Status   03/01/2022 14 12 - 78 U/L Final     AST   Date Value Ref Range Status   03/01/2022 7 (L) 15 - 37 U/L Final     The 10-year ASCVD risk score (Arnett DK, et al., 2019) is: 30.8%    Values used to calculate the score:      Age: 72 years      Sex: Female       Is Non-Hispanic African American: Yes      Diabetic: Yes      Tobacco smoker: No      Systolic Blood Pressure: 0000000 mmHg      Is BP treated: Yes      HDL Cholesterol: 72 MG/DL      Total Cholesterol: 253 MG/DL     PLAN  The following are interventions that have been identified:   Refill/s of JARDIANCE TAB 10MG  READY to pick up at patient's pharmacy [facility].  Patient eligible for 100 day supply of JARDIANCE TAB 10MG ,   Per PreCheck a 30 day supply and a 100 day supply have a $11.20 co pay.    Outreach:    Patient:  Letter sent to patient. This medication can be filled for a 100-day supply to save you time and trips to the pharmacy. Your co pay is $11.20, for a 30 or 100 day supply. I believe on your voicemail, I may have said 90 day, but they  will cover up to a 100 day supply for the same amount of money.     Attempting to reach patient to review.  Left message asking for return call.    Last Visit: 06.13.23  Next Visit: 05.08.24    Donney Rankins, [place]   [phone] option 2      For Pharmacy Admin Tracking Only    Program: [place] in place:  No  Gap Closed?: No   Time Spent (min): 20

## 2022-12-21 ENCOUNTER — Encounter: Payer: MEDICARE | Attending: Student in an Organized Health Care Education/Training Program | Primary: Family

## 2023-01-03 ENCOUNTER — Encounter: Payer: Self-pay | Admitting: Gastroenterology

## 2023-01-25 ENCOUNTER — Ambulatory Visit
Admit: 2023-01-25 | Discharge: 2023-01-25 | Payer: MEDICARE | Attending: Student in an Organized Health Care Education/Training Program | Primary: Student in an Organized Health Care Education/Training Program

## 2023-01-25 DIAGNOSIS — Z1231 Encounter for screening mammogram for malignant neoplasm of breast: Secondary | ICD-10-CM

## 2023-01-25 DIAGNOSIS — Z7689 Persons encountering health services in other specified circumstances: Secondary | ICD-10-CM

## 2023-01-25 MED ORDER — GABAPENTIN 300 MG PO CAPS
300 MG | ORAL_CAPSULE | Freq: Every evening | ORAL | 1 refills | Status: DC | PRN
Start: 2023-01-25 — End: 2024-01-03

## 2023-01-25 NOTE — Progress Notes (Signed)
Lindsey Huynh (DOB:  1951-02-13) is a 72 y.o. female, New patient, here for evaluation of the following chief complaint(s):  New Patient and Establish Care (Pt stated she would like a kidney function test done today as a  kidney doctor was requested.)        Subjective   SUBJECTIVE/OBJECTIVE:  Patient presents today to establish care with me    Patient does have prior PCP  Patient's prior PCP is Carolan Shiver, APRN - NP  Cardiologist is with [facility] - Dr. Leonia Corona and Florentina Jenny NP  Nephrologist is Dr. Ardyth Man, MD    Acute concerns: Wants blood work done    Chronic problems:    Type 2 Diabetes Mellitus  - Home BG checks: Does not check frequently, they run between 90-120  - Home medications include: Metformin 1000 mg with dinner and Jardiance 10 mg daily  - Taking medications as prescribed without side effects  - Diet: Reviewed over diet, discussed controlling carb intake, decreasing intake of bread, pasta, rice, potatoes, corn and sugary drinks/items  - Exercise: Encouraged regular exercise - 150 minutes/week  - ROS: No vision changes, changes in urination, numbness/tingling  - Preventative care:  - Last HgbA1c: 7.7 on 02/09/22  - Last urine micro: 02/28/22  - Last eye exam was: Today 01/25/23 first name Trinna Post  - Last foot exam was : 03/18/22  - Statin: Atorvastatin 80 mg daily  - ACE-I/ARB: Entresto  - Pneumonia vaccine: 9/16.2017    Hypetension  - BP today 117/77    Heart Failure/Coronary Artery Disease - Sees cardiologist  - Patient is on ASA 81 mg, Coreg 6.25 mg BID  - She is also on Entresto 49-51 BID and Aldactone 25 mg daily    CKD3 - sees neurology Dr. Dorthula Matas     Vitamin D deficiency - he is on high dose vitamin D being managed by nephrologist    Preventative care:  Health Maintenance Due   Topic Date Due    Diabetic retinal exam  Never done    Colorectal Cancer Screen  Never done    Breast cancer screen  Never done    Lipids  08/21/2021    Annual Wellness Visit (Medicare Advantage)  Never done    A1C test  (Diabetic or Prediabetic)  02/10/2023      Had normal colonoscopy about 5 years ago in [place] - states she is good for 10 years    Past Medical History:   Diagnosis Date    Arthritis     CAD (coronary artery disease)     Cardiac arrest (HCC) 11/12/2020    Formatting of this note might be different from the original.   Added automatically from request for surgery [id number]  Formatting of this note might be different from the original.   Formatting of this note might be different from the original.   Added automatically from request for surgery [id number]      Last Assessment & Plan:    Formatting of this note might be different from the original.   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   -History of right femoral-popliteal bypass in June 2023   -History of left femoropopliteal  bypass in 2021 by Dr. 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   Had been downtrending in the hospital. Likely 2/2 her PEA arrest      Last Assessment & Plan:    Formatting of this note might be different from the original.   Hepatic panel on 2/11: Alt 156, alk phos 177   CMP on 2/16: ALT 56, alk phos 143   Both down trending   -Recheck CMP on 12/16/20  Formatting of this note might be different fro    PAD (peripheral artery disease) (HCC)     Pneumonia due to COVID-19 virus 10/29/2020    Formatting of this note might be different from the original.   She is s/p dexamethasone and remdesevir.      Last Assessment & Plan:    Formatting of this note might be different from the original.   Encouraged patient to get her booster. She stated she would call [facility] now and get it  scheduled    Pneumonia due to COVID-19 virus 10/29/2020    Formatting of this note might be different from the original.   Formatting of this note might be different from the original.   She is s/p dexamethasone and remdesevir.      Last Assessment & Plan:    Formatting of this note might be different from the original.   Encouraged patient to get her booster. She stated she would call [facility] now and get it scheduled    Stage 2 acute kidney injury (HCC) 10/29/2020    Last Assessment & Plan:    Formatting of this note might be different from the original.   Lasix today to keep negative   AKI resolving  Formatting of this note might be different from the original.   Last Assessment & Plan:    Formatting of this note might be different from the original.   Lasix today to keep negative   AKI resolving      Last Assessment & Plan:    Formatting of this note might b     Past Surgical History:   Procedure Laterality Date    COLONOSCOPY  2019    CORONARY ARTERY BYPASS GRAFT  12/25/2020    x2, [facility]     CORONARY ARTERY BYPASS GRAFT  12/25/2020    EYE SURGERY Right     CATARACT REMOVAL    FEMORAL BYPASS Left 11/28/2019     Dr Cathren Harsh    FEMORAL BYPASS Right 02/16/2022    RIGHT FEMORAL POPLITEAL BYPASS WITH PTFE, ANGIOPLASTY OF POPLITEAL AND TIBIAL ARTERIES performed by Ulyses Amor, MD at [facility] MAIN OR    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 Shirlee More    HYSTERECTOMY (CERVIX STATUS UNKNOWN)  1983    ORTHOPEDIC SURGERY  06-30-10    back surgery (tumor removed)    TUMOR REMOVAL  06/30/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      Family History   Problem Relation Age of Onset    Heart Disease Mother         CHF    High Blood Pressure Mother     High Blood Pressure Father     Diabetes Father     Heart Disease Sister     Heart  Attack Sister     Cancer Sister         THYROID    Seizures Brother     Anesth Problems Neg Hx      Social History     Socioeconomic History    Marital status: Single      Spouse name: Not on file    Number of children: Not on file    Years of education: Not on file    Highest education level: Not on file   Occupational History    Not on file   Tobacco Use    Smoking status: Former     Current packs/day: 0.00     Average packs/day: 0.5 packs/day for 10.0 years (5.0 ttl pk-yrs)     Types: Cigarettes     Start date: 67     Quit date: 2003     Years since quitting: 21.3     Passive exposure: Current    Smokeless tobacco: Never   Vaping Use    Vaping Use: Never used   Substance and Sexual Activity    Alcohol use: Yes     Alcohol/week: 3.0 standard drinks of alcohol     Types: 1 Cans of beer, 2 Shots of liquor per week     Comment: SOCIALLY    Drug use: No    Sexual activity: Not on file   Other Topics Concern    Not on file   Social History Narrative    Not on file     Social Determinants of Health     Financial Resource Strain: Low Risk  (03/01/2022)    Overall Financial Resource Strain (CARDIA)     Difficulty of Paying Living Expenses: Not hard at all   Food Insecurity: Not on file (03/01/2022)   Transportation Needs: Unknown (03/01/2022)    PRAPARE - Therapist, art (Medical): Not on file     Lack of Transportation (Non-Medical): No   Physical Activity: Not on file   Stress: Not on file   Social Connections: Not on file   Intimate Partner Violence: Not on file   Housing Stability: Unknown (03/01/2022)    Housing Stability Vital Sign     Unable to Pay for Housing in the Last Year: Not on file     Number of Places Lived in the Last Year: Not on file     Unstable Housing in the Last Year: No       Current Outpatient Medications:     ergocalciferol (ERGOCALCIFEROL) 1.25 MG (50000 UT) capsule, Take 1 capsule by mouth once a week, Disp: , Rfl:     gabapentin (NEURONTIN) 300 MG capsule, Take 1 capsule by mouth nightly as needed (Neuropathy) for up to 180 days. Max Daily Amount: 300 mg, Disp: 90 capsule, Rfl: 1    atorvastatin (LIPITOR) 80 MG tablet, Take 1 tablet  by mouth Daily with supper, Disp: , Rfl:     carvedilol (COREG) 6.25 MG tablet, Take 1 tablet by mouth 2 times daily (with meals), Disp: , Rfl:     spironolactone (ALDACTONE) 50 MG tablet, TAKE 1 TABLET BY MOUTH DAILY, Disp: 90 tablet, Rfl: 3    sacubitril-valsartan (ENTRESTO) 49-51 MG per tablet, Take 1 tablet by mouth 2 times daily, Disp: , Rfl:     furosemide (LASIX) 20 MG tablet, Take 1 tablet by mouth as needed WEIGHT GAIN, Disp: , Rfl:     aspirin 81 MG EC tablet, Take by mouth  daily, Disp: , Rfl:     empagliflozin (JARDIANCE) 10 MG tablet, Take by mouth daily, Disp: , Rfl:     metFORMIN (GLUCOPHAGE-XR) 500 MG extended release tablet, Take 2 tablets by mouth Daily with supper, Disp: , Rfl:   No Known Allergies    Review of Systems   Constitutional:  Negative for chills and fever.   Respiratory:  Negative for cough and shortness of breath.    Cardiovascular:  Negative for chest pain and palpitations.   Gastrointestinal:  Negative for abdominal pain, blood in stool, constipation, diarrhea, nausea and vomiting.   Musculoskeletal:  Positive for arthralgias. Negative for myalgias.   Neurological:  Positive for numbness. Negative for dizziness and headaches.   Psychiatric/Behavioral:  Negative for dysphoric mood and sleep disturbance. The patient is not nervous/anxious.           Objective   Vitals:    01/25/23 0926   BP: 117/77   Site: Left Upper Arm   Position: Sitting   Pulse: 57   Temp: 98.8 F (37.1 C)   TempSrc: Oral   SpO2: 98%   Weight: 70.9 kg (156 lb 3.2 oz)   Height: 1.626 m (5\' 4" )      Physical Exam  Vitals and nursing note reviewed.   Constitutional:       General: She is not in acute distress.     Appearance: Normal appearance. She is not ill-appearing.   HENT:      Mouth/Throat:      Mouth: Mucous membranes are moist.      Pharynx: Oropharynx is clear.   Cardiovascular:      Rate and Rhythm: Regular rhythm.      Pulses:           Dorsalis pedis pulses are 2+ on the right side and 2+ on the left  side.      Heart sounds: No murmur heard.  Pulmonary:      Effort: Pulmonary effort is normal.      Breath sounds: Normal breath sounds. No wheezing.   Abdominal:      General: Bowel sounds are normal. There is no distension.      Palpations: Abdomen is soft.      Tenderness: There is no abdominal tenderness.   Musculoskeletal:      Right lower leg: No edema.      Left lower leg: No edema.      Right foot: No deformity.      Left foot: No deformity.      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   Feet:      Right foot:      Protective Sensation: 8 sites tested.  8 sites sensed.      Skin integrity: Dry skin present. No skin breakdown or callus.      Toenail Condition: Right toenails are abnormally thick. Fungal disease present.     Left foot:      Protective Sensation: 4 sites tested.  4 sites sensed.      Skin integrity: Dry skin present. No skin breakdown or callus.      Comments:    Left foot: normal sensation, s/p toe amputation (all 5 toes)   Right foot: normal sensation  Neurological:      Mental Status: She is alert.   Psychiatric:         Mood and Affect: Mood normal.         Behavior: Behavior normal.  Assessment & Plan   ASSESSMENT/PLAN:  1. Establishing care with new doctor, encounter for  2. Chronic systolic heart failure (HCC)  -     Comprehensive Metabolic Panel; Future  -     Magnesium; Future  3. Primary hypertension  -     CBC with Auto Differential; Future  -     Comprehensive Metabolic Panel; Future  -     Magnesium; Future  4. Arteriosclerosis of coronary artery  Overview:  Last Assessment & Plan:    Formatting of this note might be different from the original.   S/p CABGx2 LIMA to LAD and SVG to OM on 4/8 fast track extubated and weaned off vasoactive gtts immediately post op   - POD1 TTE showing severe reduction in RV function so epi was started and diuresis increased   - AV wires and CT all out on 4/12   - FG even to -500 with lasix 20mg    - cont midodrine 5q8   -  asa 162, plavix 75   - will add home coreg when off midodrine  Formatting of this note might be different from the original.   Last Assessment & Plan:    Formatting of this note might be different from the original.   S/p CABGx2 LIMA to LAD and SVG to OM on 4/8 fast track extubated and weaned off vasoactive gtts immediately post op   - POD1 TTE showing severe reduction in RV function so epi was started and diuresis increased   - AV wires and CT all out on 4/12   - FG even to -500 with lasix 20mg    - cont midodrine 5q8   - asa 162, plavix 75   - will add home coreg when off midodrine    Orders:  -     Lipid Panel; Future  5. 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 Hypercholesterolemia  -     Lipid Panel; Future  8. Stage 3b chronic kidney disease (HCC)  Overview:  Nephrologist: Dr. Cleotilde Neer  Orders:  -     Comprehensive Metabolic Panel; Future  9. Type 2 diabetes with nephropathy (HCC)  -     Comprehensive Metabolic Panel; Future  -     Hemoglobin A1C; Future  -     Microalbumin / Creatinine Urine Ratio; Future  -     HM DIABETES FOOT EXAM  10. Type 2 diabetes mellitus with diabetic neuropathy, without long-term current use of insulin (HCC)  -     Hemoglobin A1C; Future  -     Microalbumin / Creatinine Urine Ratio; Future  -     11-Drug Screen, Urine w Rflx Confirm; Future  -     HM DIABETES FOOT EXAM  -     gabapentin (NEURONTIN) 300 MG capsule; Take 1 capsule by mouth nightly as needed (Neuropathy) for up to 180 days. Max Daily Amount: 300 mg, Disp-90 capsule, R-1Normal  11. Vitamin D deficiency  -     Vitamin D 25 Hydroxy; Future  12. Encounter for screening mammogram for breast cancer  -     MAM DIGITAL SCREEN W OR WO CAD BILATERAL; Future  13. Screening for colorectal cancer  -     Cologuard (Fecal DNA Colorectal Cancer Screening)  14. Medication monitoring encounter  -     11-Drug Screen, Urine w Rflx Confirm; Future  Patient  presents for establish care visit with me.  Acute concerns addressed.  Chronic problems reviewed.  Medications and history reviewed.  See below for additional information.  CHF: Chronic and stable. Patient follows with cardiology. Continue medications per cardiology.  Hypertension: Chronic and stable. Continue current medications  CAD: Chronic and stable. Patient follows with cardiology. Continue medications per cardiology.  PVD: Chronic and stable. Patient follows with cardiology. Continue medications per cardiology.  Absence of L forefoot - noted  Hyperlipidemia: Chronic and stable. Lifestyle/dietary changes reviewed. Continue current medications: atorvastatin 80 mg daily. Patient is due for repeat lipid panel which was ordered.  CKD3B: Chronic and stable. Patient follows with nephrology. Repeat kidney function today.  Type 2 diabetes mellitus: Chronic and controlled  Last A1c: 7.7 repeat today  Reviewed over diet/lifestyle  Medications:   Current: Metformin 1000 mg daily with Jardiance 10 mg daily  Changes: None  Preventative care:  Urine microalbumin: 03/01/22 - repeat ordered  Foot exam: Done today 01/25/23  Pneumonia vaccine: Done 2017  Eye exam: Has appointment today  Lipid panel: Ordered today 01/25/23  Statin therapy: Atorvastatin 80 mg daily  ACE-I/ARB: Entresto  Follow-up: 3 months  See above. For neuropathy, will continue gabapentin. Discussed risk and benefits of being on controlled medication. Patient understands risks and benefits and would like to proceed. Did check PMP which is appropriate its been over a month since patient has had this medication refilled.  We will also completed a controlled med contract in the room after which is signed copy was given to the patient. A random urine drug screen was also performed.  Vitamin D deficiency: Chronic and stable.  We will recheck levels today.  Will recommend supplementation pending results.  Breast cancer screening was discussed today as patient is due for  this. Patient was agreeable to obtaining a mammogram, which was ordered today. Patient encouraged to call to schedule this.  Discussed colon cancer screening with the patient as patient is due for this. She states she had colonoscopy done, but no way of obtaining report. Patient opted to have cologuard done.  Order was placed today.  UDS per above    Return in about 4 weeks (around 02/22/2023) for AMW - medicare wellness.         An electronic signature was used to authenticate this note.    --Unk Lightning, MD

## 2023-01-25 NOTE — Progress Notes (Signed)
RM13    Chief Complaint   Patient presents with    New Patient    Establish Care     Pt stated she would like a kidney function test done today as a  kidney doctor was requested.       Vitals:    01/25/23 0926   BP: 117/77   Site: Left Upper Arm   Position: Sitting   Pulse: 57   Temp: 98.8 F (37.1 C)   TempSrc: Oral   SpO2: 98%   Weight: 70.9 kg (156 lb 3.2 oz)   Height: 1.626 m (5\' 4" )        "Have you been to the ER, urgent care clinic since your last visit?  Hospitalized since your last visit?"    NO    "Have you seen or consulted any other health care providers outside of [facility] since your last visit?"    NO        "Have you had a colorectal cancer screening such as a colonoscopy/FIT/Cologuard?    NO    No colonoscopy on file  No cologuard on file  No FIT/FOBT on file   No flexible sigmoidoscopy on file         Click Here for Release of Records Request   AVS  education, follow up, and recommendations provided and noaddressed with patient. Interpreter services used to advise patient

## 2023-01-25 NOTE — Patient Instructions (Signed)
Thanks for choosing me to be your primary care provider.  If you have not signed up for MyChart yet, I recommend doing this. This allows you to stay in touch with me and send me messages. I am usually quite responsive during the week, so this is the best way to get in touch with me. It also gives you the ability to see your visit summaries and lab results.  If you have vaccinations at your pharmacy or prior doctor's office, please have them fax me the report at [phone].    If you have specialists that you see, you can also have them fax over records to the number above.  I am usually not in office Friday afternoons, so keep in mind any message or calls in at that time, may not be responded until Monday.  If you need refills of any medications, please let me know a few days in advance as sometimes it can take a couple of days for refills to turn around.  I generally do not manage chronic pain or prescribe opioids for long term use. If you are in need of those services, I generally refer my patients to see a pain management specialist.    For labs, we do have a lab here, but they are not always present. If I sent you out for labs, you can walk-in to any of the locations provided. I would bring your lab slip just in case.  For any referrals, they are supposed to reach out to you, but if you do not hear back within a few business days, you can call the number provided to schedule.  For any imaging tests, you can call central scheduling at [phone] to schedule.   I usually review results within 24-48 hours, but sometimes it can be delayed, especially if there are results that are still pending (some labs return within a day, but others may take a few days).    If you have any questions about anything, please do let me know.   My goal and desire as your primary care doctor is to provide the highest standard of care but also to listen to your concerns.   I look forward to working together with you to take care  of your health.    - Dr. Teagan Heidrick

## 2023-01-26 LAB — CBC WITH AUTO DIFFERENTIAL
Basophils %: 1 % (ref 0–1)
Basophils Absolute: 0 10*3/uL (ref 0.0–0.1)
Eosinophils %: 4 % (ref 0–7)
Eosinophils Absolute: 0.3 10*3/uL (ref 0.0–0.4)
Hematocrit: 37.9 % (ref 35.0–47.0)
Hemoglobin: 11.9 g/dL (ref 11.5–16.0)
Immature Granulocytes %: 0 % (ref 0.0–0.5)
Immature Granulocytes Absolute: 0 10*3/uL (ref 0.00–0.04)
Lymphocytes %: 23 % (ref 12–49)
Lymphocytes Absolute: 1.6 10*3/uL (ref 0.8–3.5)
MCH: 32 PG (ref 26.0–34.0)
MCHC: 31.4 g/dL (ref 30.0–36.5)
MCV: 101.9 FL — ABNORMAL HIGH (ref 80.0–99.0)
MPV: 11.2 FL (ref 8.9–12.9)
Monocytes %: 8 % (ref 5–13)
Monocytes Absolute: 0.5 10*3/uL (ref 0.0–1.0)
Neutrophils %: 64 % (ref 32–75)
Neutrophils Absolute: 4.3 10*3/uL (ref 1.8–8.0)
Nucleated RBCs: 0 PER 100 WBC
Platelets: 321 10*3/uL (ref 150–400)
RBC: 3.72 M/uL — ABNORMAL LOW (ref 3.80–5.20)
RDW: 14.5 % (ref 11.5–14.5)
WBC: 6.7 10*3/uL (ref 3.6–11.0)
nRBC: 0 10*3/uL (ref 0.00–0.01)

## 2023-01-26 LAB — COMPREHENSIVE METABOLIC PANEL
ALT: 20 U/L (ref 12–78)
AST: 14 U/L — ABNORMAL LOW (ref 15–37)
Albumin/Globulin Ratio: 1.6 (ref 1.1–2.2)
Albumin: 4.4 g/dL (ref 3.5–5.0)
Alk Phosphatase: 140 U/L — ABNORMAL HIGH (ref 45–117)
Anion Gap: 6 mmol/L (ref 5–15)
BUN: 36 MG/DL — ABNORMAL HIGH (ref 6–20)
Bun/Cre Ratio: 26 — ABNORMAL HIGH (ref 12–20)
CO2: 23 mmol/L (ref 21–32)
Calcium: 11.3 MG/DL — ABNORMAL HIGH (ref 8.5–10.1)
Chloride: 110 mmol/L — ABNORMAL HIGH (ref 97–108)
Creatinine: 1.39 MG/DL — ABNORMAL HIGH (ref 0.55–1.02)
Est, Glom Filt Rate: 40 mL/min/{1.73_m2} — ABNORMAL LOW (ref >60)
Globulin: 2.7 g/dL (ref 2.0–4.0)
Glucose: 178 mg/dL — ABNORMAL HIGH (ref 65–100)
Potassium: 5 mmol/L (ref 3.5–5.1)
Sodium: 139 mmol/L (ref 136–145)
Total Bilirubin: 0.4 MG/DL (ref 0.2–1.0)
Total Protein: 7.1 g/dL (ref 6.4–8.2)

## 2023-01-26 LAB — LIPID PANEL
Chol/HDL Ratio: 2.7 (ref 0.0–5.0)
Cholesterol, Total: 143 MG/DL (ref <200)
HDL: 53 MG/DL
LDL Cholesterol: 64.6 MG/DL (ref 0–100)
Triglycerides: 127 MG/DL (ref <150)
VLDL Cholesterol Calculated: 25.4 MG/DL

## 2023-01-26 LAB — VITAMIN D 25 HYDROXY: Vit D, 25-Hydroxy: 53.7 ng/mL (ref 30–100)

## 2023-01-26 LAB — HEMOGLOBIN A1C
Estimated Avg Glucose: 166 mg/dL
Hemoglobin A1C: 7.4 % — ABNORMAL HIGH (ref 4.0–5.6)

## 2023-01-26 LAB — MAGNESIUM: Magnesium: 2.2 mg/dL (ref 1.6–2.4)

## 2023-02-01 LAB — 11-DRUG SCREEN, URINE W RFLX CONFIRM
Amphetamine, Urine: NEGATIVE ng/mL
Barbiturates, Urine: NEGATIVE ng/mL
Benzodiazepines, Urine: NEGATIVE ng/mL
Cocaine, Urine: NEGATIVE ng/mL
MDMA, Urine: NEGATIVE ng/mL
Methadone, Urine: NEGATIVE ng/mL
Methaqualone Screen, Urine: NEGATIVE ng/mL
Opiates, Urine: NEGATIVE ng/mL
Phencyclidine, Urine: NEGATIVE ng/mL
Propoxyphene, Urine: NEGATIVE ng/mL

## 2023-02-01 LAB — CANNABINOID CONFIRM, UR
Cannabinoids, Urine: POSITIVE — AB
Carboxy THC GC/MS Conf: 121 ng/mL

## 2023-02-01 NOTE — Telephone Encounter (Signed)
Patient voiced understanding of her lab results and she said she was going to see her kidney doctor today. She  stated that she did leave a urine sample as well.

## 2023-02-01 NOTE — Other (Signed)
Please call and notify patient of the following:    1. Your blood counts are good and improved from prior. Your hemoglobin which is what we check for anemia is in normal range.  2. Your kidney function is stable. You does have kidney disease and it is stage 3B still, but it is stable and slightly better than where it was when it was last checked. Your BUN/Creatinine ratio is a bit high so you may be able to drink a bit more water.  3. Your electrolytes are okay with the exception of your calcium being a bit high. Your magnesium is normal.   4. Your vitamin D is normal. With your calcium being high and normal vitamin D, I would STOP your vitamin D supplementation  5. Your liver tests are good  6. Your A1c is still high, but it is slightly improved from last time from 7.7 to now 7.4.  7. Your cholesterol panel is great.    I would keep taking your medications as is. You may consider talking with your kidney doctor if you could increase the Jardiance to 25 mg.    Your urine tests are pending. I am not sure we obtained a urine sample but if not, we need to have one done. You may need to call and schedule a lab visit or go to one of the [facility] to have it done.    Best,    Dr. Dierdre Searles

## 2023-02-14 ENCOUNTER — Other Ambulatory Visit: Payer: Self-pay | Admitting: Internal Medicine

## 2023-02-14 DIAGNOSIS — Z1231 Encounter for screening mammogram for malignant neoplasm of breast: Secondary | ICD-10-CM

## 2023-02-22 ENCOUNTER — Ambulatory Visit
Payer: MEDICARE | Attending: Student in an Organized Health Care Education/Training Program | Primary: Student in an Organized Health Care Education/Training Program

## 2023-02-22 NOTE — Progress Notes (Unsigned)
Note entered/encounter closed for administrative reasons.    No-show for appointment    No future appointments.    Jadarrius Maselli, MD

## 2023-03-01 DIAGNOSIS — L218 Other seborrheic dermatitis: Secondary | ICD-10-CM | POA: Diagnosis not present

## 2023-03-01 DIAGNOSIS — D225 Melanocytic nevi of trunk: Secondary | ICD-10-CM | POA: Diagnosis not present

## 2023-03-01 DIAGNOSIS — D2261 Melanocytic nevi of right upper limb, including shoulder: Secondary | ICD-10-CM | POA: Diagnosis not present

## 2023-03-01 DIAGNOSIS — D2262 Melanocytic nevi of left upper limb, including shoulder: Secondary | ICD-10-CM | POA: Diagnosis not present

## 2023-03-07 ENCOUNTER — Ambulatory Visit
Admission: RE | Admit: 2023-03-07 | Discharge: 2023-03-07 | Disposition: A | Discharge status: Home / Self Care | Payer: Medicare Other | Source: Ambulatory Visit | Attending: Internal Medicine | Admitting: Internal Medicine

## 2023-03-07 DIAGNOSIS — Z1231 Encounter for screening mammogram for malignant neoplasm of breast: Secondary | ICD-10-CM | POA: Diagnosis not present

## 2023-03-20 ENCOUNTER — Encounter: Payer: Self-pay | Admitting: Physician Assistant

## 2023-03-20 ENCOUNTER — Ambulatory Visit (INDEPENDENT_AMBULATORY_CARE_PROVIDER_SITE_OTHER): Payer: Medicare Other | Admitting: Physician Assistant

## 2023-03-20 ENCOUNTER — Other Ambulatory Visit (INDEPENDENT_AMBULATORY_CARE_PROVIDER_SITE_OTHER): Payer: Medicare Other

## 2023-03-20 DIAGNOSIS — M25552 Pain in left hip: Secondary | ICD-10-CM

## 2023-03-20 DIAGNOSIS — M7062 Trochanteric bursitis, left hip: Secondary | ICD-10-CM

## 2023-03-20 MED ORDER — LIDOCAINE HCL 1 % IJ SOLN
3.0000 mL | INTRAMUSCULAR | Status: AC | PRN
  Administered 2023-03-20: 3 mL

## 2023-03-20 MED ORDER — METHYLPREDNISOLONE ACETATE 40 MG/ML IJ SUSP
40.0000 mg | INTRAMUSCULAR | Status: AC | PRN
  Administered 2023-03-20: 40 mg via INTRA_ARTICULAR

## 2023-03-20 NOTE — Progress Notes (Signed)
Office Visit Note   Patient: Alyssa Elliott           Date of Birth: Jun 25, 1951           MRN: [id number] Visit Date: 03/20/2023              Requested by: Chilton Greathouse, MD [address] [place],  [place] [address] PCP: Chilton Greathouse, MD   Assessment & Plan: Visit Diagnoses:  1. Pain of left hip   2. Trochanteric bursitis, left hip     Plan: She will work on IT band stretching exercises shown.  Pain persist or becomes worse she will follow-up with [place].  Questions were encouraged and answered.  Patient tolerated injection well today.  Follow-Up Instructions: Return if symptoms worsen or fail to improve.   Orders:  Orders Placed This Encounter  Procedures   Large Joint Inj   XR HIP UNILAT W OR W/O PELVIS 1V LEFT   No orders of the defined types were placed in this encounter.     Procedures: Large Joint Inj: L greater trochanter on 03/20/2023 1:33 PM Indications: pain Details: 22 G 1.5 in needle, lateral approach  Arthrogram: No  Medications: 3 mL lidocaine 1 %; 40 mg methylPREDNISolone acetate 40 MG/ML Outcome: tolerated well, no immediate complications Procedure, treatment alternatives, risks and benefits explained, specific risks discussed. Consent was given by the patient. Immediately prior to procedure a time out was called to verify the correct patient, procedure, equipment, support staff and site/side marked as required. Patient was prepped and draped in the usual sterile fashion.       Clinical Data: No additional findings.   Subjective: Chief Complaint  Patient presents with   Left Hip - Pain    HPI Alyssa Elliott comes in today with left hip pain it has been ongoing for the past 6 months she describes the pain in his groin she is anterior to anterior lateral aspect of the hip with where she points to.  She states she had to start doing yoga due to the pain.  She notes that if she rolls over on the hip at night this awakens her.  She has had  1 or 2 instances where she had tingling anterior aspect of the left hip.  No known injury.  She is nondiabetic.  Denies any fevers chills or ongoing infections.  Review of Systems See HPI otherwise negative or noncontributory.  Objective: Vital Signs: LMP 09/04/2002 (Exact Date)   Physical Exam Constitutional:      Appearance: She is normal weight. She is not ill-appearing or diaphoretic.  Cardiovascular:     Pulses: Normal pulses.  Neurological:     Mental Status: She is alert and oriented to person, place, and time.  Psychiatric:        Mood and Affect: Mood normal.     Ortho Exam Bilateral hips: Right hip good range of motion without pain.  Left hip she has discomfort with extremes of internal/external rotation.  Tenderness over the left hip trochanteric region only.  Ambulates without any assistive device. Specialty Comments:  No specialty comments available.  Imaging: XR HIP UNILAT W OR W/O PELVIS 1V LEFT  Result Date: 03/20/2023 AP pelvis lateral view left hip: Bilateral hips well located.  Femoral acetabular joints well-maintained bilaterally.  No acute fractures.  No bony lesions.    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) 06/30/2020   Abnormal positron emission tomography (PET) scan 04/03/2020   Bronchiectasis without complication (HCC) 05/31/2019   Bronchiectasis with acute exacerbation (HCC) 05/21/2019   Healthcare maintenance 05/21/2019   Chronic left shoulder pain 04/01/2019   Adhesive capsulitis of left shoulder 04/01/2019   Lung nodule seen on imaging study 11/12/2012   Maxillary sinusitis, chronic 10/27/2012   Hemorrhoids 05/04/2012   Thrombosed external hemorrhoid 05/04/2012   Seasonal and perennial allergic rhinitis 10/16/2011   Asthma with bronchitis 10/16/2011    GERD (gastroesophageal reflux disease) 10/16/2011   Past Medical History:  Diagnosis Date   Allergy    Anxiety    Asthma    Cataract 2018   removed   Complication of anesthesia    drop in BP   DDD (degenerative disc disease)    Depression    GERD (gastroesophageal reflux disease)    Hemorrhoids    Hyperlipidemia    Lymphoma (HCC) dx 2021   no tx for now   Vitamin D deficiency     Family History  Problem Relation Age of Onset   Heart disease Mother    Diabetes Mother    Tuberculosis Paternal Grandfather    Bone cancer Paternal Grandfather    Breast cancer Sister 21   Melanoma Sister    Cirrhosis Maternal Grandfather    Heart failure Paternal Grandmother    Colon cancer Neg Hx    Ovarian cancer Neg Hx    Uterine cancer Neg Hx     Past Surgical History:  Procedure Laterality Date   ATRIAL ABLATION SURGERY  1997   SVT   cataract surgery Bilateral 2017   COLONOSCOPY  2016   FOOT NEUROMA SURGERY     multiple times bilaterally   IR IMAGING GUIDED PORT INSERTION  07/13/2020   IR REMOVAL TUN ACCESS W/ PORT W/O FL MOD SED  06/04/2021   PLANTAR FASCIA RELEASE Left 1999 or 2000   SHOULDER ARTHROSCOPY Left 04/19/2019   Procedure: LEFT SHOULDER MANIPULATION UNDER ANESTHESIA AND ARTHROSCOPY WITH EXTENSIVE DEBRIDEMENT;  Surgeon: Kathryne Hitch, MD;  Location: [facility];  Service: Orthopedics;  Laterality: Left;   UPPER GI ENDOSCOPY  yrs ago   Social History   Occupational History   Occupation: retired Financial controller  Tobacco Use   Smoking status: Never   Smokeless tobacco: Never  Vaping Use   Vaping Use: Never used  Substance and Sexual Activity   Alcohol use: No   Drug use: No   Sexual activity: Not Currently    Partners: Male    Birth control/protection: Post-menopausal

## 2023-04-04 DIAGNOSIS — C8260 Cutaneous follicle center lymphoma, unspecified site: Secondary | ICD-10-CM | POA: Diagnosis not present

## 2023-04-06 ENCOUNTER — Ambulatory Visit
Payer: MEDICARE | Attending: Student in an Organized Health Care Education/Training Program | Primary: Student in an Organized Health Care Education/Training Program

## 2023-04-25 ENCOUNTER — Ambulatory Visit
Payer: MEDICARE | Attending: Student in an Organized Health Care Education/Training Program | Primary: Student in an Organized Health Care Education/Training Program

## 2023-05-11 ENCOUNTER — Other Ambulatory Visit: Payer: Self-pay | Admitting: Hematology and Oncology

## 2023-05-11 ENCOUNTER — Inpatient Hospital Stay (HOSPITAL_BASED_OUTPATIENT_CLINIC_OR_DEPARTMENT_OTHER): Payer: Medicare Other | Admitting: Hematology and Oncology

## 2023-05-11 ENCOUNTER — Inpatient Hospital Stay: Payer: Medicare Other | Attending: Hematology and Oncology

## 2023-05-11 VITALS — BP 144/66 | HR 67 | Temp 98.0°F | Resp 13 | Wt 138.9 lb

## 2023-05-11 DIAGNOSIS — C8253 Diffuse follicle center lymphoma, intra-abdominal lymph nodes: Secondary | ICD-10-CM

## 2023-05-11 DIAGNOSIS — Z8572 Personal history of non-Hodgkin lymphomas: Secondary | ICD-10-CM | POA: Diagnosis not present

## 2023-05-11 DIAGNOSIS — Z803 Family history of malignant neoplasm of breast: Secondary | ICD-10-CM | POA: Insufficient documentation

## 2023-05-11 DIAGNOSIS — Z808 Family history of malignant neoplasm of other organs or systems: Secondary | ICD-10-CM | POA: Diagnosis not present

## 2023-05-11 LAB — CMP (CANCER CENTER ONLY)
ALT: 32 U/L (ref 0–44)
AST: 25 U/L (ref 15–41)
Albumin: 3.9 g/dL (ref 3.5–5.0)
Alkaline Phosphatase: 86 U/L (ref 38–126)
Anion gap: 6 (ref 5–15)
BUN: 15 mg/dL (ref 8–23)
CO2: 28 mmol/L (ref 22–32)
Calcium: 8.9 mg/dL (ref 8.9–10.3)
Chloride: 108 mmol/L (ref 98–111)
Creatinine: 0.91 mg/dL (ref 0.44–1.00)
GFR, Estimated: >60 mL/min (ref >60)
Glucose, Bld: 136 mg/dL — ABNORMAL HIGH (ref 70–99)
Potassium: 3.9 mmol/L (ref 3.5–5.1)
Sodium: 142 mmol/L (ref 135–145)
Total Bilirubin: 0.4 mg/dL (ref 0.3–1.2)
Total Protein: 6.1 g/dL — ABNORMAL LOW (ref 6.5–8.1)

## 2023-05-11 LAB — CBC WITH DIFFERENTIAL (CANCER CENTER ONLY)
Abs Immature Granulocytes: 0.02 10*3/uL (ref 0.00–0.07)
Basophils Absolute: 0 10*3/uL (ref 0.0–0.1)
Basophils Relative: 1 %
Eosinophils Absolute: 0.2 10*3/uL (ref 0.0–0.5)
Eosinophils Relative: 3 %
HCT: 42.9 % (ref 36.0–46.0)
Hemoglobin: 14 g/dL (ref 12.0–15.0)
Immature Granulocytes: 0 %
Lymphocytes Relative: 31 %
Lymphs Abs: 1.8 10*3/uL (ref 0.7–4.0)
MCH: 30.9 pg (ref 26.0–34.0)
MCHC: 32.6 g/dL (ref 30.0–36.0)
MCV: 94.7 fL (ref 80.0–100.0)
Monocytes Absolute: 0.3 10*3/uL (ref 0.1–1.0)
Monocytes Relative: 6 %
Neutro Abs: 3.4 10*3/uL (ref 1.7–7.7)
Neutrophils Relative %: 59 %
Platelet Count: 180 10*3/uL (ref 150–400)
RBC: 4.53 MIL/uL (ref 3.87–5.11)
RDW: 13.8 % (ref 11.5–15.5)
WBC Count: 5.7 10*3/uL (ref 4.0–10.5)
nRBC: 0 % (ref 0.0–0.2)

## 2023-05-11 LAB — LACTATE DEHYDROGENASE: LDH: 162 U/L (ref 98–192)

## 2023-05-11 NOTE — Progress Notes (Signed)
[facility] [phone]   [phone]  PROGRESS NOTE  Patient Care Team: Chilton Greathouse, MD as PCP - General (Internal Medicine) Kathryne Hitch, MD as Consulting Physician (Orthopedic Surgery)  Hematological/Oncological History # Non-Hodgkin B Cell Lymphoma, Follicular Lymphoma. Stage III 1) 02/25/2020: patient underwent excision of a 1.3 cm mobile nodule on the left vertex of the scalp. 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  2) 03/18/2020: Establish care with Dr. Leonides Schanz  3) 03/30/2020: PET CT scan showed  involvement of the left scalp, left axillary lymph node, left hilar and infrahilar nodes, subcarinal node, and mesenteric lymph nodes, as well as a masslike appearance/ high metabolic activity in the lower uterus/cervix.  4) 04/21/2020: biopsy of the mass in the lower uterus/cervix consistent with follicular lymphoma.  5) 06/25/2020: CT C/A/P showed the soft tissue mass in the abdomen increase in size from 5.8 x 3.6 cm to 6.5 x 5.7 cm. The other lymphadenopathy was stable. Patient elected to move forward with treatment.  6) 07/06/2020: Cycle 1 Day 1 of Rituximab/Bendamustine 7) 07/17/2020-07/22/2020: Admitted to [facility] with fevers. Infectious workup negative, but scans show mesenteric panniculitis (fibrosing mesenteritis). Started on prednisone 40mg  PO daily.  8) 08/06/2020-08/27/2020: Weekly Rituximab monotherapy.  9) 09/28/2020: PET CT scan shows no residual disease. Complete response to therapy noted.  10) 10/23/2020: start of rituximab q 8 weeks for consolidation.  11) 12/25/2020: dose 2 of q 8 week rituximab  12) 02/26/2021: dose 3 of q 8 week rituximab  13) 04/28/2021: dose 4 of q 8 week rituximab  14) 05/26/2022: CT chest abdomen pelvis showed no evidence of residual or recurrent disease.  Interval History:  Alyssa Elliott 72 y.o. female with medical history significant for  diffuse non Hodgkin B cell lymphoma who presents for a follow up visit. The patient's last visit was on 11/10/2022. In the interim since her last visit she has had no major changes in her health. She presents today for a routine surveillance visit.   On exam today Alyssa Elliott is accompanied by her husband.  She reports she recently went on an [place] cruise.  Overall she feels great.  She notes that her energy is good and her appetite is strong.  She is intentionally losing weight and is down to 138.9 pounds, down from over 160 pounds last year.  She reports her goal is to be at 130 pounds.  She is been using the weight watchers program to assist with this.  She denies any lymphadenopathy.  She is not having any infectious symptoms such as runny nose, sore throat, or cough.  Overall she feels well and has no questions concerns or complaints today.  She denies having any issues with fevers, chills, sweats, nausea, vomiting or diarrhea. A full 10 point ROS is listed below.  MEDICAL HISTORY:  Past Medical History:  Diagnosis Date   Allergy    Anxiety    Asthma    Cataract 2018   removed   Complication of anesthesia    drop in BP   DDD (degenerative disc disease)    Depression    GERD (gastroesophageal reflux disease)    Hemorrhoids    Hyperlipidemia    Lymphoma (HCC) dx 2021   no tx for now   Vitamin D deficiency     SURGICAL HISTORY: Past Surgical History:  Procedure Laterality Date   ATRIAL ABLATION SURGERY  1997   SVT   cataract surgery Bilateral 2017  COLONOSCOPY  2016   FOOT NEUROMA SURGERY     multiple times bilaterally   IR IMAGING GUIDED PORT INSERTION  07/13/2020   IR REMOVAL TUN ACCESS W/ PORT W/O FL MOD SED  06/04/2021   PLANTAR FASCIA RELEASE Left 1999 or 2000   SHOULDER ARTHROSCOPY Left 04/19/2019   Procedure: LEFT SHOULDER MANIPULATION UNDER ANESTHESIA AND ARTHROSCOPY WITH EXTENSIVE DEBRIDEMENT;  Surgeon: Kathryne Hitch, MD;  Location: [facility];  Service:  Orthopedics;  Laterality: Left;   UPPER GI ENDOSCOPY  yrs ago    SOCIAL HISTORY: Social History   Socioeconomic History   Marital status: Married    Spouse name: Not on file   Number of children: 0   Years of education: Not on file   Highest education level: Not on file  Occupational History   Occupation: retired Financial controller  Tobacco Use   Smoking status: Never   Smokeless tobacco: Never  Vaping Use   Vaping status: Never Used  Substance and Sexual Activity   Alcohol use: No   Drug use: No   Sexual activity: Not Currently    Partners: Male    Birth control/protection: Post-menopausal  Other Topics Concern   Not on file  Social History Narrative   Not on file   Social Determinants of Health   Financial Resource Strain: Not on file  Food Insecurity: Not on file  Transportation Needs: Not on file  Physical Activity: Not on file  Stress: Not on file  Social Connections: Unknown (02/01/2022)   Received from [facility]   Social Network    Social Network: Not on file  Intimate Partner Violence: Unknown (12/24/2021)   Received from [facility]   HITS    Physically Hurt: Not on file    Insult or Talk Down To: Not on file    Threaten Physical Harm: Not on file    Scream or Curse: Not on file    FAMILY HISTORY: Family History  Problem Relation Age of Onset   Heart disease Mother    Diabetes Mother    Tuberculosis Paternal Grandfather    Bone cancer Paternal Grandfather    Breast cancer Sister 67   Melanoma Sister    Cirrhosis Maternal Grandfather    Heart failure Paternal Grandmother    Colon cancer Neg Hx    Ovarian cancer Neg Hx    Uterine cancer Neg Hx     ALLERGIES:  is allergic to bupropion, codeine, levofloxacin, penicillins, keflex [cephalexin], latex, and sulfa antibiotics.  MEDICATIONS:  Current Outpatient Medications  Medication Sig Dispense Refill   ARIPiprazole (ABILIFY) 2 MG tablet Take 2 mg by mouth daily.      aspirin 81 MG tablet  Take 81 mg by mouth at bedtime.      Melatonin 5 MG TABS Take 20 mg by mouth at bedtime.      meloxicam (MOBIC) 15 MG tablet Take 1 tablet by mouth daily as needed.     pantoprazole (PROTONIX) 40 MG tablet Take 40 mg by mouth 2 (two) times daily.     PARoxetine (PAXIL) 40 MG tablet Take 40 mg by mouth daily.      Rosuvastatin Calcium 20 MG CPSP 1 tablet     No current facility-administered medications for this visit.   Facility-Administered Medications Ordered in Other Visits  Medication Dose Route Frequency Provider Last Rate Last Admin   0.9 %  sodium chloride infusion   Intravenous Continuous Boisseau, Hayley, PA  REVIEW OF SYSTEMS:   Constitutional: ( - ) fevers, ( - )  chills , ( - ) night sweats Eyes: ( - ) blurriness of vision, ( - ) double vision, ( - ) watery eyes Ears, nose, mouth, throat, and face: ( - ) mucositis, ( - ) sore throat Respiratory: ( - ) cough, ( - ) dyspnea, ( - ) wheezes Cardiovascular: ( - ) palpitation, ( - ) chest discomfort, ( - ) lower extremity swelling Gastrointestinal:  ( - ) nausea, ( - ) heartburn, ( - ) change in bowel habits Skin: ( - ) abnormal skin rashes Lymphatics: ( - ) new lymphadenopathy, ( - ) easy bruising Neurological: ( - ) numbness, ( - ) tingling, ( - ) new weaknesses Behavioral/Psych: ( - ) mood change, ( - ) new changes  All other systems were reviewed with the patient and are negative.  PHYSICAL EXAMINATION: GENERAL: well appearing elderly Caucasian female. alert, no distress and comfortable SKIN: scalp lesion reduced to a concave dent in scalp. skin color, texture, turgor are normal, no rashes or significant lesions EYES: conjunctiva are pink and non-injected, sclera clear LYMPH:  no palpable lymphadenopathy in the cervical, axillary or supraclavicular LUNGS: clear to auscultation and percussion with normal breathing effort HEART: regular rate & rhythm and no murmurs and no lower extremity edema Musculoskeletal: no  cyanosis of digits and no clubbing  PSYCH: alert & oriented x 3, fluent speech NEURO: no focal motor/sensory deficits  LABORATORY DATA:  I have reviewed the data as listed    Latest Ref Rng & Units 05/11/2023    3:25 PM 11/10/2022    2:14 PM 05/26/2022    2:38 PM  CBC  WBC 4.0 - 10.5 K/uL 5.7  5.8  5.2   Hemoglobin 12.0 - 15.0 g/dL 09.8  11.9  14.7   Hematocrit 36.0 - 46.0 % 42.9  42.2  43.6   Platelets 150 - 400 K/uL 180  196  179        Latest Ref Rng & Units 05/11/2023    3:25 PM 11/10/2022    2:14 PM 05/26/2022    2:38 PM  CMP  Glucose 70 - 99 mg/dL 829  562  76   BUN 8 - 23 mg/dL 15  13  15    Creatinine 0.44 - 1.00 mg/dL 1.30  8.65  7.84   Sodium 135 - 145 mmol/L 142  142  139   Potassium 3.5 - 5.1 mmol/L 3.9  4.2  4.0   Chloride 98 - 111 mmol/L 108  107  106   CO2 22 - 32 mmol/L 28  30  30    Calcium 8.9 - 10.3 mg/dL 8.9  8.9  9.1   Total Protein 6.5 - 8.1 g/dL 6.1  6.2  6.5   Total Bilirubin 0.3 - 1.2 mg/dL 0.4  0.4  0.5   Alkaline Phos 38 - 126 U/L 86  81  95   AST 15 - 41 U/L 25  21  19    ALT 0 - 44 U/L 32  18  17     No results found for: "MPROTEIN" No results found for: "KPAFRELGTCHN", "LAMBDASER", "KAPLAMBRATIO"   RADIOGRAPHIC STUDIES:  No results found.  ASSESSMENT & PLAN MYANGEL PASSINI 72  y.o. female with medical history significant for diffuse non Hodgkin B cell lymphoma who presents for a follow up visit.  After review of the imaging, review the labs, discussion with the patient her findings are most  consistent with a diffuse non-Hodgkin B-cell lymphoma, follicular lymphoma.  The initial diagnosis based off the biopsy from the lesion on the head was that of a follicle cell lymphoma which tends to be a cutaneous lymphoma, however this diagnosis was considerably less likely given the diffuse spread throughout the lymph nodes of the chest and abdomen. Biopsy of the FDG avid lesion in the pelvis resulted with follicular lymphoma.   Previously we discussed  the diagnosis of follicular lymphoma and the options moving forward.  She currently does not meet any GELF Criteria and therefore is not required to start therapy.  We discussed that treatment could be started if she so desired and we discussed the risks and benefits of bendamustine rituximab therapy.  She voiced her understanding of the different options between starting treatment and continued observation.  After discussion with her husband she noted she would like to start treatment given the continued growth of the lesion which was abutting the urinary bladder.   The regimen of choice for this patient is Rituximab monotherapy with Rituximab 375mg /m2 IV weekly x 4 weeks, followed by IV rituximab q 8 weeks thereafter. This is being administered with curative intent. We transitioned to this regimen from bendamustine rituximab due to intolerance and hospitalization following her first cycle of treatment.  # Non-Hodgkin B Cell Lymphoma, Follicular Lymphoma. Stage III --pre treatment CT findings showed lymphadenopathy on both sides of the diaphragm. Consistent with at least a Stage III lymphoma -- confirmed the diagnosis with biopsy of the FDG avid mass near the uterus/cervix. Cancel the left axillary lymph node biopsy --started treatment with R-Benda, however the patient ended up hospitalized due to fever, weakness, and marked drop in her labs.  She did not tolerate rituximab and bendamustine combination altogether and therefore we proceeded with rituximab monotherapy alone. She completed Rituximab weekly x 4 weeks.  Plan:  --CT imaging recommend q 6 months x 2 years.  Next CT scan currently due.  -- Patient has completed maintenance rituximab --labs today show WBC 5.7, hemoglobin 14.0, MCV 94.7, and platelets of 180 --RTC in 6 months time   # Leukopenia-resolved. -- Unclear etiology of leukopenia, improved on last 2 visits to normal levels.   #Fibrosing Mesenteritis (Mesenteric Panniculitis),  resolved --improved with prednisone taper, complete resolution of symptoms.  --completed steroid taper.  --continue to monitor   #Supportive Care --port removed --zofran 8mg  q8H PRN and compazine 10mg  PO q6H for nausea -- EMLA cream for port -- no pain medication required at this time.   Orders Placed This Encounter  Procedures   CT CHEST ABDOMEN PELVIS W CONTRAST    Standing Status:   Future    Standing Expiration Date:   05/10/2024    Order Specific Question:   If indicated for the ordered procedure, I authorize the administration of contrast media per Radiology protocol    Answer:   Yes    Order Specific Question:   Does the patient have a contrast media/X-ray dye allergy?    Answer:   No    Order Specific Question:   Preferred imaging location?    Answer:   [facility]    Order Specific Question:   If indicated for the ordered procedure, I authorize the administration of oral contrast media per Radiology protocol    Answer:   Yes   All questions were answered. The patient knows to call the clinic with any problems, questions or concerns.  A total of more than 30 minutes were  spent on this encounter and over half of that time was spent on counseling and coordination of care as outlined above.   Ulysees Barns, MD Department of Hematology/Oncology [facility] Phone: [phone] Pager: [phone] Email: [email or web address]  05/11/2023 4:29 PM

## 2023-05-12 ENCOUNTER — Telehealth: Payer: Self-pay | Admitting: Hematology and Oncology

## 2023-05-18 ENCOUNTER — Ambulatory Visit (HOSPITAL_COMMUNITY)
Admission: RE | Admit: 2023-05-18 | Discharge: 2023-05-18 | Disposition: A | Discharge status: Home / Self Care | Payer: Medicare Other | Source: Ambulatory Visit | Attending: Hematology and Oncology | Admitting: Hematology and Oncology

## 2023-05-18 DIAGNOSIS — C8253 Diffuse follicle center lymphoma, intra-abdominal lymph nodes: Secondary | ICD-10-CM | POA: Diagnosis not present

## 2023-05-18 DIAGNOSIS — Z8572 Personal history of non-Hodgkin lymphomas: Secondary | ICD-10-CM | POA: Diagnosis not present

## 2023-05-18 MED ORDER — IOHEXOL 9 MG/ML PO SOLN
500.0000 mL | ORAL | Status: AC
  Administered 2023-05-18 (×2): 500 mL via ORAL

## 2023-05-18 MED ORDER — IOHEXOL 300 MG/ML  SOLN
100.0000 mL | Freq: Once | INTRAMUSCULAR | Status: AC | PRN
  Administered 2023-05-18: 100 mL via INTRAVENOUS

## 2023-05-25 ENCOUNTER — Encounter: Payer: Self-pay | Admitting: Physician Assistant

## 2023-05-25 ENCOUNTER — Ambulatory Visit (INDEPENDENT_AMBULATORY_CARE_PROVIDER_SITE_OTHER): Payer: Medicare Other | Admitting: Physician Assistant

## 2023-05-25 VITALS — BP 100/62 | HR 74 | Ht 64.0 in | Wt 138.4 lb

## 2023-05-25 DIAGNOSIS — Z86010 Personal history of colonic polyps: Secondary | ICD-10-CM | POA: Diagnosis not present

## 2023-05-25 DIAGNOSIS — K5909 Other constipation: Secondary | ICD-10-CM

## 2023-05-25 DIAGNOSIS — K5900 Constipation, unspecified: Secondary | ICD-10-CM

## 2023-05-25 NOTE — Patient Instructions (Addendum)
_______________________________________________________  If your blood pressure at your visit was 140/90 or greater, please contact your primary care physician to follow up on this.  _______________________________________________________  If you are age 74 or older, your body mass index should be between 23-30. Your Body mass index is 23.75 kg/m. If this is out of the aforementioned range listed, please consider follow up with your Primary Care Provider.  If you are age 56 or younger, your body mass index should be between 19-25. Your Body mass index is 23.75 kg/m. If this is out of the aformentioned range listed, please consider follow up with your Primary Care Provider.   ________________________________________________________  The [facility] providers would like to encourage you to use [facility] to communicate with providers for non-urgent requests or questions.  Due to long hold times on the telephone, sending your provider a message by [facility] may be a faster and more efficient way to get a response.  Please allow 48 business hours for a response.  Please remember that this is for non-urgent requests.  _______________________________________________________  Please purchase the following medications over the counter and take as directed: Miralax 17g daily and increase to 2 times a day if in 1 week it doesn't help  You have been scheduled for an appointment with Hyacinth Meeker PA-C on 08-10-2023 at 2pm . Please arrive 10 minutes early for your appointment.  It was a pleasure to see you today!  Thank you for trusting me with your gastrointestinal care!

## 2023-05-25 NOTE — Progress Notes (Signed)
Chief Complaint: Constipation  HPI:    Alyssa Elliott is a 72 year old female, known to Dr. Lavon Paganini, with a past medical history as listed below including anxiety and reflux, who was referred to me by Chilton Greathouse, MD for a complaint of constipation.      11/07/2017 colonoscopy for unexplained GI bleeding with a 5 mm polyp in the transverse colon, nonbleeding internal hemorrhoids and diverticulosis in the sigmoid and descending colon.  Pathology revealed tubular adenoma.  (Old guidelines for repeat recommended in 5 years, new guidelines repeat recommended in 7 to 10 years).    05/11/2023 CBC normal.  CMP with a glucose of 136 and otherwise normal.    05/20/2023 CT of the chest abdomen pelvis with contrast for history of diffuse follicular lymphoma showing no evidence of recurrent lymphoma in the chest, abdomen or pelvis and no splenomegaly.    Today, patient presents to clinic and describes that she has had a change in her bowel habits recently over the past few months.  Apparently has always had issues with chronic constipation but would only need occasional laxatives like Colace or MiraLAX and now over the past few months feels like she cannot go at all unless she takes something.  Tells me she will go a week and then use a couple of Colace or a dose of MiraLAX (which does not seem to work well for her) or glycerin suppository and then will have a bowel movement where the first but is formed and the rest is diarrhea.  Does admit to a change in her diet since February trying to eat healthier and lose some weight with a lot of fiber and water but not so much meat.  She wonders if this has anything to do with it.  Denies any new medications over that timeframe.    Denies fever, chills, intentional weight loss, blood in her stool, nausea or vomiting.  Past Medical History:  Diagnosis Date   Allergy    Anxiety    Asthma    Cataract 2018   removed   Complication of anesthesia    drop in BP    DDD (degenerative disc disease)    Depression    GERD (gastroesophageal reflux disease)    Hemorrhoids    Hyperlipidemia    Lymphoma (HCC) dx 2021   no tx for now   Vitamin D deficiency     Past Surgical History:  Procedure Laterality Date   ATRIAL ABLATION SURGERY  1997   SVT   cataract surgery Bilateral 2017   COLONOSCOPY  2016   FOOT NEUROMA SURGERY     multiple times bilaterally   IR IMAGING GUIDED PORT INSERTION  07/13/2020   IR REMOVAL TUN ACCESS W/ PORT W/O FL MOD SED  06/04/2021   PLANTAR FASCIA RELEASE Left 1999 or 2000   SHOULDER ARTHROSCOPY Left 04/19/2019   Procedure: LEFT SHOULDER MANIPULATION UNDER ANESTHESIA AND ARTHROSCOPY WITH EXTENSIVE DEBRIDEMENT;  Surgeon: Kathryne Hitch, MD;  Location: [facility];  Service: Orthopedics;  Laterality: Left;   UPPER GI ENDOSCOPY  yrs ago    Current Outpatient Medications  Medication Sig Dispense Refill   ARIPiprazole (ABILIFY) 2 MG tablet Take 2 mg by mouth daily.      aspirin 81 MG tablet Take 81 mg by mouth at bedtime.      Melatonin 5 MG TABS Take 20 mg by mouth at bedtime.      meloxicam (MOBIC) 15 MG tablet Take 1 tablet by mouth daily as  needed.     pantoprazole (PROTONIX) 40 MG tablet Take 40 mg by mouth 2 (two) times daily.     PARoxetine (PAXIL) 40 MG tablet Take 40 mg by mouth daily.      Rosuvastatin Calcium 20 MG CPSP 1 tablet     No current facility-administered medications for this visit.   Facility-Administered Medications Ordered in Other Visits  Medication Dose Route Frequency Provider Last Rate Last Admin   0.9 %  sodium chloride infusion   Intravenous Continuous Boisseau, Hayley, PA        Allergies as of 05/25/2023 - Review Complete 05/11/2023  Allergen Reaction Noted   Bupropion Other (See Comments) 10/14/2020   Codeine Nausea And Vomiting 04/02/2013   Levofloxacin Other (See Comments) 10/14/2020   Penicillins Swelling and Other (See Comments) 06/14/2011   Keflex [cephalexin] Rash 07/17/2020    Latex Rash and Other (See Comments) 04/02/2013   Sulfa antibiotics Rash 04/02/2013    Family History  Problem Relation Age of Onset   Heart disease Mother    Diabetes Mother    Tuberculosis Paternal Grandfather    Bone cancer Paternal Grandfather    Breast cancer Sister 41   Melanoma Sister    Cirrhosis Maternal Grandfather    Heart failure Paternal Grandmother    Colon cancer Neg Hx    Ovarian cancer Neg Hx    Uterine cancer Neg Hx     Social History   Socioeconomic History   Marital status: Married    Spouse name: Not on file   Number of children: 0   Years of education: Not on file   Highest education level: Not on file  Occupational History   Occupation: retired Financial controller  Tobacco Use   Smoking status: Never   Smokeless tobacco: Never  Vaping Use   Vaping status: Never Used  Substance and Sexual Activity   Alcohol use: No   Drug use: No   Sexual activity: Not Currently    Partners: Male    Birth control/protection: Post-menopausal  Other Topics Concern   Not on file  Social History Narrative   Not on file   Social Determinants of Health   Financial Resource Strain: Not on file  Food Insecurity: Not on file  Transportation Needs: Not on file  Physical Activity: Not on file  Stress: Not on file  Social Connections: Unknown (02/01/2022)   Received [facility]   Social Network    Social Network: Not on file  Intimate Partner Violence: Unknown (12/24/2021)   Received from [facility]   HITS    Physically Hurt: Not on file    Insult or Talk Down To: Not on file    Threaten Physical Harm: Not on file    Scream or Curse: Not on file    Review of Systems:    Constitutional: No weight loss, fever or chills Skin: No rash  Cardiovascular: No chest pain Respiratory: No SOB Gastrointestinal: See HPI and otherwise negative Genitourinary: No dysuria Neurological: No headache, dizziness or syncope Musculoskeletal: No new muscle or joint  pain Hematologic: No bleeding Psychiatric: No history of depression or anxiety   Physical Exam:  Vital signs: BP 100/62   Pulse 74   Ht 5\' 4"  (1.626 m)   Wt 138 lb 6 oz (62.8 kg)   LMP 09/04/2002 (Exact Date)   BMI 23.75 kg/m    Constitutional:   Pleasant Elderly Caucasian female appears to be in NAD, Well developed, Well nourished, alert and cooperative  Head:  Normocephalic and atraumatic. Eyes:   PEERL, EOMI. No icterus. Conjunctiva pink. Ears:  Normal auditory acuity. Neck:  Supple Throat: Oral cavity and pharynx without inflammation, swelling or lesion.  Respiratory: Respirations even and unlabored. Lungs clear to auscultation bilaterally.   No wheezes, crackles, or rhonchi.  Cardiovascular: Normal S1, S2. No MRG. Regular rate and rhythm. No peripheral edema, cyanosis or pallor.  Gastrointestinal:  Soft, nondistended, mild RUQ ttp, No rebound or guarding. Normal bowel sounds. No appreciable masses or hepatomegaly. Rectal:  Not performed.  Msk:  Symmetrical without gross deformities. Without edema, no deformity or joint abnormality.  Neurologic:  Alert and  oriented x4;  grossly normal neurologically.  Skin:   Dry and intact without significant lesions or rashes. Psychiatric:  Demonstrates good judgement and reason without abnormal affect or behaviors.  RELEVANT LABS AND IMAGING: CBC    Component Value Date/Time   WBC 5.7 05/11/2023 1525   WBC 7.5 12/10/2021 1747   RBC 4.53 05/11/2023 1525   HGB 14.0 05/11/2023 1525   HCT 42.9 05/11/2023 1525   PLT 180 05/11/2023 1525   MCV 94.7 05/11/2023 1525   MCH 30.9 05/11/2023 1525   MCHC 32.6 05/11/2023 1525   RDW 13.8 05/11/2023 1525   LYMPHSABS 1.8 05/11/2023 1525   MONOABS 0.3 05/11/2023 1525   EOSABS 0.2 05/11/2023 1525   BASOSABS 0.0 05/11/2023 1525    CMP     Component Value Date/Time   NA 142 05/11/2023 1525   K 3.9 05/11/2023 1525   CL 108 05/11/2023 1525   CO2 28 05/11/2023 1525   GLUCOSE 136 (H) 05/11/2023  1525   BUN 15 05/11/2023 1525   CREATININE 0.91 05/11/2023 1525   CALCIUM 8.9 05/11/2023 1525   PROT 6.1 (L) 05/11/2023 1525   ALBUMIN 3.9 05/11/2023 1525   AST 25 05/11/2023 1525   ALT 32 05/11/2023 1525   ALKPHOS 86 05/11/2023 1525   BILITOT 0.4 05/11/2023 1525   GFRNONAA >60 05/11/2023 1525   GFRAA >60 04/29/2020 0729    Assessment: 1.  Chronic constipation: Chronic for the patient but worsened over the past 3 months, now requiring a laxative in order to have any sort of bowel movement; consider slow transit +/- IBS-C 2.  History of adenomatous polyps: Last colonoscopy in 2019 with repeat recommended in 7 years  Plan: 1.  Would recommend that we trial regular MiraLAX dosing first before proceeding with colonoscopy to see if this helps.  Discussed taking 1 dose of MiraLAX daily for a week and increasing to 2 doses daily over the next couple of weeks if it is not helping.  If bowel movements do not turn to more regular on this regimen then would recommend a colonoscopy for further evaluation. 2.  Continue increased fiber and water.  Try to increase exercise. 3.  Patient to follow in clinic with me in 2 to 3 months.  Hyacinth Meeker, PA-C [facility] 05/25/2023, 1:23 PM  Cc: Chilton Greathouse, MD

## 2023-06-14 ENCOUNTER — Telehealth: Payer: Self-pay | Admitting: *Deleted

## 2023-06-14 NOTE — Telephone Encounter (Signed)
-----   Message from Ulysees Barns IV sent at 06/13/2023  2:30 PM EDT ----- Please let Alyssa Elliott know that her CT scan of the chest abdomen pelvis did not show any signs or symptoms concerning for residual/recurrent disease.  Will plan to see her back as scheduled in Feb 2025. ----- Message ----- From: Leory Plowman, Rad Results In Sent: 05/20/2023   8:23 AM EDT To: Jaci Standard, MD

## 2023-06-14 NOTE — Telephone Encounter (Signed)
[name] patient regarding recent scan results. Spoke with her. Advised that her CT scan of the chest abdomen pelvis did not show any signs or symptoms concerning for residual/recurrent disease. Will plan to see her back as scheduled in Feb 2025. Pt very pleased with results and feels well. She is aware of her appt in Feb. 2025

## 2023-06-19 NOTE — Telephone Encounter (Signed)
 Left message for patient to schedule mammogram ordered by PCP  Requested patient to return call to panel manager at [phone] to schedule mammogram or notify that mammogram   has been completed at an outside facility.

## 2023-06-19 NOTE — Telephone Encounter (Signed)
 Patient called back to make appointment for her Mammogram she is confirmed 07/04/23  9:15am at [facility] recap apt with patient and understand

## 2023-07-04 ENCOUNTER — Ambulatory Visit: Payer: MEDICARE | Primary: Student in an Organized Health Care Education/Training Program

## 2023-07-20 DIAGNOSIS — Z23 Encounter for immunization: Secondary | ICD-10-CM | POA: Diagnosis not present

## 2023-08-10 ENCOUNTER — Encounter: Payer: Self-pay | Admitting: Physician Assistant

## 2023-08-10 ENCOUNTER — Ambulatory Visit: Payer: Medicare Other | Admitting: Physician Assistant

## 2023-08-10 VITALS — BP 106/70 | HR 73 | Ht 64.0 in | Wt 136.0 lb

## 2023-08-10 DIAGNOSIS — K219 Gastro-esophageal reflux disease without esophagitis: Secondary | ICD-10-CM | POA: Diagnosis not present

## 2023-08-10 DIAGNOSIS — Z860101 Personal history of adenomatous and serrated colon polyps: Secondary | ICD-10-CM

## 2023-08-10 DIAGNOSIS — R194 Change in bowel habit: Secondary | ICD-10-CM

## 2023-08-10 DIAGNOSIS — K5900 Constipation, unspecified: Secondary | ICD-10-CM

## 2023-08-10 MED ORDER — LINACLOTIDE 72 MCG PO CAPS: 72.0000 ug | ORAL_CAPSULE | Freq: Every day | ORAL | 3 refills | Status: AC

## 2023-08-10 MED ORDER — ESOMEPRAZOLE MAGNESIUM 40 MG PO CPDR: 40.0000 mg | DELAYED_RELEASE_CAPSULE | Freq: Two times a day (BID) | ORAL | 5 refills | Status: DC

## 2023-08-10 MED ORDER — LINACLOTIDE 72 MCG PO CAPS: 72.0000 ug | ORAL_CAPSULE | Freq: Every day | ORAL | 0 refills | Status: DC

## 2023-08-10 NOTE — Patient Instructions (Addendum)
We have given you samples of the following medication to take: Linzess 72 mcg: Take daily 30 minutes before a meal  Linzess works best when taken once a day every day, on an empty stomach, at least 30 minutes before your first meal of the day.  When Linzess is taken daily as directed:  *Constipation relief is typically felt in about a week *IBS-C patients may begin to experience relief from belly pain and overall abdominal symptoms (pain, discomfort, and bloating) in about 1 week,   with symptoms typically improving over 12 weeks.  Diarrhea may occur in the first 2 weeks -keep taking it.  The diarrhea should go away and you should start having normal, complete, full bowel movements. It may be helpful to start treatment when you can be near the comfort of your own bathroom, such as a weekend.    Stop pantoprazole  We have sent the following medications to your pharmacy for you to pick up at your convenience: Nexium 40 mg: Take twice daily  Please call in January and let us know how you are doing.  Thank you for choosing [facility] and for entrusting me with your care, Hyacinth Meeker, PA-C   If your blood pressure at your visit was 140/90 or greater, please contact your primary care physician to follow up on this. ______________________________________________________  If you are age 64 or older, your body mass index should be between 23-30. Your Body mass index is 23.34 kg/m. If this is out of the aforementioned range listed, please consider follow up with your Primary Care Provider.  If you are age 75 or younger, your body mass index should be between 19-25. Your Body mass index is 23.34 kg/m. If this is out of the aformentioned range listed, please consider follow up with your Primary Care Provider.  ________________________________________________________  The [place] [facility] providers would like to encourage you to use [facility] to communicate with providers for  non-urgent requests or questions.  Due to long hold times on the telephone, sending your provider a message by [facility] may be a faster and more efficient way to get a response.  Please allow 48 business hours for a response.  Please remember that this is for non-urgent requests.  _______________________________________________________  Due to recent changes in healthcare laws, you may see the results of your imaging and laboratory studies on MyChart before your provider has had a chance to review them.  We understand that in some cases there may be results that are confusing or concerning to you. Not all laboratory results come back in the same time frame and the provider may be waiting for multiple results in order to interpret others.  Please give [place] 48 hours in order for your provider to thoroughly review all the results before contacting the office for clarification of your results.   Marland Kitchen

## 2023-08-10 NOTE — Progress Notes (Signed)
Chief Complaint: GERD and follow-up change in bowel habits  HPI:    Alyssa Elliott is a 72 year old female, known to Dr. Lavon Paganini, with a past medical history as listed below including anxiety and reflux, who returns to clinic today for follow-up of change in bowel habits and GERD.    11/07/2017 colonoscopy for unexplained GI bleeding with a 5 mm polyp in the transverse colon, nonbleeding internal hemorrhoids and diverticulosis in the sigmoid and descending colon.  Pathology revealed tubular adenoma.  (Old guidelines for repeat recommended in 5 years, new guidelines repeat recommended in 7 to 10 years).    05/11/2023 CBC normal.  CMP with a glucose of 136 and otherwise normal.    05/20/2023 CT of the chest abdomen pelvis with contrast for history of diffuse follicular lymphoma showing no evidence of recurrent lymphoma in the chest, abdomen or pelvis and no splenomegaly.    05/25/2023 patient seen in clinic and described a change in her bowel habits over the past few months to constipation.  At that time discussed that she should trial regular MiraLAX dosing first.  She was eager to possibly have an earlier colonoscopy.    Today, patient tells me she tried daily MiraLAX for a week but was still having to strain for bowel movement so she just stopped it completely and is using prunes and other fiber products.  Tells me she is still concerned as it feels like things are just not moving in there.  She would like to have an earlier colonoscopy.    Also describes an increase in reflux over the past month regardless of her Pantoprazole 40 mg twice a day which she has been on for years.  Denies any NSAIDs or Prednisone or other new medications over the past month.    Denies fever, chills or weight loss.  Past Medical History:  Diagnosis Date   Allergy    Anxiety    Asthma    Cataract 2018   removed   Complication of anesthesia    drop in BP   DDD (degenerative disc disease)    Depression    GERD  (gastroesophageal reflux disease)    Hemorrhoids    Hyperlipidemia    Lymphoma (HCC) dx 2021   no tx for now   Vitamin D deficiency     Past Surgical History:  Procedure Laterality Date   ATRIAL ABLATION SURGERY  1997   SVT   cataract surgery Bilateral 2017   COLONOSCOPY  2016   FOOT NEUROMA SURGERY     multiple times bilaterally   IR IMAGING GUIDED PORT INSERTION  07/13/2020   IR REMOVAL TUN ACCESS W/ PORT W/O FL MOD SED  06/04/2021   PLANTAR FASCIA RELEASE Left 1999 or 2000   SHOULDER ARTHROSCOPY Left 04/19/2019   Procedure: LEFT SHOULDER MANIPULATION UNDER ANESTHESIA AND ARTHROSCOPY WITH EXTENSIVE DEBRIDEMENT;  Surgeon: Kathryne Hitch, MD;  Location: [facility];  Service: Orthopedics;  Laterality: Left;   UPPER GI ENDOSCOPY  yrs ago    Current Outpatient Medications  Medication Sig Dispense Refill   aspirin 81 MG tablet Take 81 mg by mouth at bedtime.      Melatonin 5 MG TABS Take 20 mg by mouth at bedtime.      meloxicam (MOBIC) 15 MG tablet Take 1 tablet by mouth daily as needed.     pantoprazole (PROTONIX) 40 MG tablet Take 40 mg by mouth 2 (two) times daily.     PARoxetine (PAXIL) 40 MG tablet Take  40 mg by mouth daily.      Rosuvastatin Calcium 20 MG CPSP 1 tablet     ARIPiprazole (ABILIFY) 2 MG tablet Take 2 mg by mouth daily.  (Patient not taking: Reported on 08/10/2023)     No current facility-administered medications for this visit.   Facility-Administered Medications Ordered in Other Visits  Medication Dose Route Frequency Provider Last Rate Last Admin   0.9 %  sodium chloride infusion   Intravenous Continuous Boisseau, Hayley, PA        Allergies as of 08/10/2023 - Review Complete 08/10/2023  Allergen Reaction Noted   Bupropion Other (See Comments) 10/14/2020   Codeine Nausea And Vomiting 04/02/2013   Levofloxacin Other (See Comments) 10/14/2020   Penicillins Swelling and Other (See Comments) 06/14/2011   Keflex [cephalexin] Rash 07/17/2020   Latex  Rash and Other (See Comments) 04/02/2013   Sulfa antibiotics Rash 04/02/2013    Family History  Problem Relation Age of Onset   Heart disease Mother    Diabetes Mother    Tuberculosis Paternal Grandfather    Bone cancer Paternal Grandfather    Breast cancer Sister 54   Melanoma Sister    Cirrhosis Maternal Grandfather    Heart failure Paternal Grandmother    Colon cancer Neg Hx    Ovarian cancer Neg Hx    Uterine cancer Neg Hx     Social History   Socioeconomic History   Marital status: Married    Spouse name: Not on file   Number of children: 0   Years of education: Not on file   Highest education level: Not on file  Occupational History   Occupation: retired Financial controller  Tobacco Use   Smoking status: Never   Smokeless tobacco: Never  Vaping Use   Vaping status: Never Used  Substance and Sexual Activity   Alcohol use: No   Drug use: No   Sexual activity: Not Currently    Partners: Male    Birth control/protection: Post-menopausal  Other Topics Concern   Not on file  Social History Narrative   Not on file   Social Determinants of Health   Financial Resource Strain: Not on file  Food Insecurity: Not on file  Transportation Needs: Not on file  Physical Activity: Not on file  Stress: Not on file  Social Connections: Unknown (02/01/2022)   [facility]   Social Network    Social Network: Not on file  Intimate Partner Violence: Unknown (12/24/2021)   Received from [employer], [facility]   HITS    Physically Hurt: Not on file    Insult or Talk Down To: Not on file    Threaten Physical Harm: Not on file    Scream or Curse: Not on file    Review of Systems:    Constitutional: No weight loss, fever or chills Cardiovascular: No chest pain   Respiratory: No SOB  Gastrointestinal: See HPI and otherwise negative   Physical Exam:  Vital signs: BP 106/70   Pulse 73   Ht 5\' 4"  (1.626 m)   Wt 136 lb (61.7 kg)   LMP  09/04/2002 (Exact Date)   BMI 23.34 kg/m   Constitutional:   Pleasant elderly Caucasian female appears to be in NAD, Well developed, Well nourished, alert and cooperative  Respiratory: Respirations even and unlabored. Lungs clear to auscultation bilaterally.   No wheezes, crackles, or rhonchi.  Cardiovascular: Normal S1, S2. No MRG. Regular rate and rhythm. No peripheral edema, cyanosis  or pallor.  Gastrointestinal:  Soft, nondistended, nontender. No rebound or guarding. Decreased BS all four quadrants. No appreciable masses or hepatomegaly. Rectal:  Not performed.  Psychiatric:  Demonstrates good judgement and reason without abnormal affect or behaviors.  RELEVANT LABS AND IMAGING: CBC    Component Value Date/Time   WBC 5.7 05/11/2023 1525   WBC 7.5 12/10/2021 1747   RBC 4.53 05/11/2023 1525   HGB 14.0 05/11/2023 1525   HCT 42.9 05/11/2023 1525   PLT 180 05/11/2023 1525   MCV 94.7 05/11/2023 1525   MCH 30.9 05/11/2023 1525   MCHC 32.6 05/11/2023 1525   RDW 13.8 05/11/2023 1525   LYMPHSABS 1.8 05/11/2023 1525   MONOABS 0.3 05/11/2023 1525   EOSABS 0.2 05/11/2023 1525   BASOSABS 0.0 05/11/2023 1525    CMP     Component Value Date/Time   NA 142 05/11/2023 1525   K 3.9 05/11/2023 1525   CL 108 05/11/2023 1525   CO2 28 05/11/2023 1525   GLUCOSE 136 (H) 05/11/2023 1525   BUN 15 05/11/2023 1525   CREATININE 0.91 05/11/2023 1525   CALCIUM 8.9 05/11/2023 1525   PROT 6.1 (L) 05/11/2023 1525   ALBUMIN 3.9 05/11/2023 1525   AST 25 05/11/2023 1525   ALT 32 05/11/2023 1525   ALKPHOS 86 05/11/2023 1525   BILITOT 0.4 05/11/2023 1525   GFRNONAA >60 05/11/2023 1525   GFRAA >60 04/29/2020 0729    Assessment: 1.  Change in bowel habits: Towards worsened constipation, tried MiraLAX daily for a week which did not change much, she is growing worried; likely slow transit +/- IBS-C 2.  GERD: Increase in symptoms over the past month regardless of Pantoprazole 40 twice daily; consider  gastritis 3.  History of adenomatous polyps: Last colonoscopy in 2019 with repeat recommended in 7 years  Plan: 1.  Discussed constipation.  Patient was worried and would like a colonoscopy sooner. 2.  Right now we will start the patient on Linzess 72 mcg daily 30 minutes before breakfast #30 with 5 refills.  Also provided her with samples. 3.  We will change Pantoprazole to Nexium 40 mg twice daily, 30-60 minutes before breakfast and dinner.  #60 with 5 refills. 4.  Discussed with patient that I would like her to call back in in January.  If nothing has changed we will go ahead and schedule her for a diagnostic colonoscopy given change in bowel habits and if she is still having reflux issues we will schedule an EGD with this.  Hyacinth Meeker, PA-C [facility] 08/10/2023, 2:06 PM  Cc: Chilton Greathouse, MD

## 2023-09-27 ENCOUNTER — Encounter

## 2023-09-27 NOTE — Telephone Encounter (Signed)
Last appointment: 01/25/2023 MD Dierdre Searles   Next appointment: Nothing scheduled   Previous refill encounter(s):   01/25/2023 Neurontin #90 with 1 refill.     No access to PDMP     For Pharmacy Admin Tracking Only    Program: Medication Refill  Intervention Detail: New Rx: 1, reason: Patient Preference  Time Spent (min): 5    Requested Prescriptions     Pending Prescriptions Disp Refills    gabapentin (NEURONTIN) 300 MG capsule 90 capsule 0     Sig: Take 1 capsule by mouth nightly as needed (Neuropathy) for up to 180 days. Max Daily Amount: 300 mg

## 2023-09-27 NOTE — Telephone Encounter (Signed)
This is a controlled medication and patient no showed last visit on 04/06/2023. She needs to be seen before this can be refilled.    Unk Lightning, MD

## 2023-11-16 ENCOUNTER — Inpatient Hospital Stay: Payer: Medicare Other | Admitting: Hematology and Oncology

## 2023-11-16 ENCOUNTER — Inpatient Hospital Stay: Payer: Medicare Other | Attending: Hematology and Oncology

## 2023-11-16 ENCOUNTER — Other Ambulatory Visit: Payer: Self-pay | Admitting: Hematology and Oncology

## 2023-11-16 VITALS — BP 127/64 | HR 68 | Temp 98.1°F | Resp 16 | Ht 64.0 in | Wt 137.8 lb

## 2023-11-16 DIAGNOSIS — C8253 Diffuse follicle center lymphoma, intra-abdominal lymph nodes: Secondary | ICD-10-CM

## 2023-11-16 DIAGNOSIS — Z808 Family history of malignant neoplasm of other organs or systems: Secondary | ICD-10-CM | POA: Diagnosis not present

## 2023-11-16 DIAGNOSIS — Z803 Family history of malignant neoplasm of breast: Secondary | ICD-10-CM | POA: Diagnosis not present

## 2023-11-16 DIAGNOSIS — Z95828 Presence of other vascular implants and grafts: Secondary | ICD-10-CM

## 2023-11-16 DIAGNOSIS — Z9221 Personal history of antineoplastic chemotherapy: Secondary | ICD-10-CM | POA: Diagnosis not present

## 2023-11-16 DIAGNOSIS — C8228 Follicular lymphoma grade III, unspecified, lymph nodes of multiple sites: Secondary | ICD-10-CM | POA: Diagnosis present

## 2023-11-16 LAB — CBC WITH DIFFERENTIAL (CANCER CENTER ONLY)
Abs Immature Granulocytes: 0.01 10*3/uL (ref 0.00–0.07)
Basophils Absolute: 0.1 10*3/uL (ref 0.0–0.1)
Basophils Relative: 2 %
Eosinophils Absolute: 0.3 10*3/uL (ref 0.0–0.5)
Eosinophils Relative: 6 %
HCT: 44.5 % (ref 36.0–46.0)
Hemoglobin: 14.6 g/dL (ref 12.0–15.0)
Immature Granulocytes: 0 %
Lymphocytes Relative: 32 %
Lymphs Abs: 1.6 10*3/uL (ref 0.7–4.0)
MCH: 31 pg (ref 26.0–34.0)
MCHC: 32.8 g/dL (ref 30.0–36.0)
MCV: 94.5 fL (ref 80.0–100.0)
Monocytes Absolute: 0.3 10*3/uL (ref 0.1–1.0)
Monocytes Relative: 6 %
Neutro Abs: 2.8 10*3/uL (ref 1.7–7.7)
Neutrophils Relative %: 54 %
Platelet Count: 191 10*3/uL (ref 150–400)
RBC: 4.71 MIL/uL (ref 3.87–5.11)
RDW: 13 % (ref 11.5–15.5)
WBC Count: 5.1 10*3/uL (ref 4.0–10.5)
nRBC: 0 % (ref 0.0–0.2)

## 2023-11-16 LAB — CMP (CANCER CENTER ONLY)
ALT: 15 U/L (ref 0–44)
AST: 19 U/L (ref 15–41)
Albumin: 4 g/dL (ref 3.5–5.0)
Alkaline Phosphatase: 89 U/L (ref 38–126)
Anion gap: 3 — ABNORMAL LOW (ref 5–15)
BUN: 12 mg/dL (ref 8–23)
CO2: 32 mmol/L (ref 22–32)
Calcium: 8.9 mg/dL (ref 8.9–10.3)
Chloride: 107 mmol/L (ref 98–111)
Creatinine: 0.85 mg/dL (ref 0.44–1.00)
GFR, Estimated: >60 mL/min (ref >60)
Glucose, Bld: 103 mg/dL — ABNORMAL HIGH (ref 70–99)
Potassium: 3.8 mmol/L (ref 3.5–5.1)
Sodium: 142 mmol/L (ref 135–145)
Total Bilirubin: 0.4 mg/dL (ref 0.0–1.2)
Total Protein: 6.1 g/dL — ABNORMAL LOW (ref 6.5–8.1)

## 2023-11-16 LAB — LACTATE DEHYDROGENASE: LDH: 140 U/L (ref 98–192)

## 2023-11-16 NOTE — Progress Notes (Signed)
 [facility] [phone]   [phone]  PROGRESS NOTE  Patient Care Team: Chilton Greathouse, MD as PCP - General (Internal Medicine) Kathryne Hitch, MD as Consulting Physician (Orthopedic Surgery)  Hematological/Oncological History # Non-Hodgkin B Cell Lymphoma, Follicular Lymphoma. Stage III 1) 02/25/2020: patient underwent excision of a 1.3 cm mobile nodule on the left vertex of the scalp. 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  2) 03/18/2020: Establish care with Dr. Leonides Schanz  3) 03/30/2020: PET CT scan showed  involvement of the left scalp, left axillary lymph node, left hilar and infrahilar nodes, subcarinal node, and mesenteric lymph nodes, as well as a masslike appearance/ high metabolic activity in the lower uterus/cervix.  4) 04/21/2020: biopsy of the mass in the lower uterus/cervix consistent with follicular lymphoma.  5) 06/25/2020: CT C/A/P showed the soft tissue mass in the abdomen increase in size from 5.8 x 3.6 cm to 6.5 x 5.7 cm. The other lymphadenopathy was stable. Patient elected to move forward with treatment.  6) 07/06/2020: Cycle 1 Day 1 of Rituximab/Bendamustine 7) 07/17/2020-07/22/2020: Admitted to [facility] with fevers. Infectious workup negative, but scans show mesenteric panniculitis (fibrosing mesenteritis). Started on prednisone 40mg  PO daily.  8) 08/06/2020-08/27/2020: Weekly Rituximab monotherapy.  9) 09/28/2020: PET CT scan shows no residual disease. Complete response to therapy noted.  10) 10/23/2020: start of rituximab q 8 weeks for consolidation.  11) 12/25/2020: dose 2 of q 8 week rituximab  12) 02/26/2021: dose 3 of q 8 week rituximab  13) 04/28/2021: dose 4 of q 8 week rituximab  14) 05/26/2022: CT chest abdomen pelvis showed no evidence of residual or recurrent disease.  Interval History:  Alyssa Elliott 73 y.o. female with medical history significant for  diffuse non Hodgkin B cell lymphoma who presents for a follow up visit. The patient's last visit was on 05/11/2023. In the interim since her last visit she has had no major changes in her health. She presents today for a routine surveillance visit.   On exam today Alyssa Elliott reports she has been well overall in the interim since our last visit 6 months ago.  She reports she is intentionally losing weight with weight watchers and is down to 137 pounds.  She reports that her husband is currently out following a plane at the moment.  She reports she has had no issues with flu, COVID, or RSV in the interim since our last visit.  She reports that she denies any bumps or lumps concerning for recurrent lymphadenopathy.  She has had no B symptoms or early satiety/abdominal swelling.  Overall she feels well and has no questions concerns or complaints today.  She denies having any issues with fevers, chills, sweats, nausea, vomiting or diarrhea. A full 10 point ROS is listed below.  MEDICAL HISTORY:  Past Medical History:  Diagnosis Date   Allergy    Anxiety    Asthma    Cataract 2018   removed   Complication of anesthesia    drop in BP   DDD (degenerative disc disease)    Depression    GERD (gastroesophageal reflux disease)    Hemorrhoids    Hyperlipidemia    Lymphoma (HCC) dx 2021   no tx for now   Vitamin D deficiency     SURGICAL HISTORY: Past Surgical History:  Procedure Laterality Date   ATRIAL ABLATION SURGERY  1997   SVT   cataract surgery Bilateral 2017   COLONOSCOPY  2016   FOOT NEUROMA SURGERY     multiple times bilaterally   IR IMAGING GUIDED PORT INSERTION  07/13/2020   IR REMOVAL TUN ACCESS W/ PORT W/O FL MOD SED  06/04/2021   PLANTAR FASCIA RELEASE Left 1999 or 2000   SHOULDER ARTHROSCOPY Left 04/19/2019   Procedure: LEFT SHOULDER MANIPULATION UNDER ANESTHESIA AND ARTHROSCOPY WITH EXTENSIVE DEBRIDEMENT;  Surgeon: Kathryne Hitch, MD;  Location: [facility];  Service:  Orthopedics;  Laterality: Left;   UPPER GI ENDOSCOPY  yrs ago    SOCIAL HISTORY: Social History   Socioeconomic History   Marital status: Married    Spouse name: Not on file   Number of children: 0   Years of education: Not on file   Highest education level: Not on file  Occupational History   Occupation: retired Financial controller  Tobacco Use   Smoking status: Never   Smokeless tobacco: Never  Vaping Use   Vaping status: Never Used  Substance and Sexual Activity   Alcohol use: No   Drug use: No   Sexual activity: Not Currently    Partners: Male    Birth control/protection: Post-menopausal  Other Topics Concern   Not on file  Social History Narrative   Not on file   Social Drivers of Health   Financial Resource Strain: Not on file  Food Insecurity: Not on file  Transportation Needs: Not on file  Physical Activity: Not on file  Stress: Not on file  Social Connections: Unknown (02/01/2022)   Received from [facility]   Social Network    Social Network: Not on file  Intimate Partner Violence: Unknown (12/24/2021)   Received from [facility], [facility]   HITS    Physically Hurt: Not on file    Insult or Talk Down To: Not on file    Threaten Physical Harm: Not on file    Scream or Curse: Not on file    FAMILY HISTORY: Family History  Problem Relation Age of Onset   Heart disease Mother    Diabetes Mother    Tuberculosis Paternal Grandfather    Bone cancer Paternal Grandfather    Breast cancer Sister 47   Melanoma Sister    Cirrhosis Maternal Grandfather    Heart failure Paternal Grandmother    Colon cancer Neg Hx    Ovarian cancer Neg Hx    Uterine cancer Neg Hx     ALLERGIES:  is allergic to bupropion, codeine, levofloxacin, penicillins, keflex [cephalexin], latex, and sulfa antibiotics.  MEDICATIONS:  Current Outpatient Medications  Medication Sig Dispense Refill   ARIPiprazole (ABILIFY) 2 MG tablet Take 2 mg by mouth daily.   (Patient not taking: Reported on 08/10/2023)     aspirin 81 MG tablet Take 81 mg by mouth at bedtime.      esomeprazole (NEXIUM) 40 MG capsule Take 1 capsule (40 mg total) by mouth 2 (two) times daily before a meal. 60 capsule 5   linaclotide (LINZESS) 72 MCG capsule Take 1 capsule (72 mcg total) by mouth daily before breakfast. (Patient not taking: Reported on 11/16/2023) 30 capsule 3   Melatonin 5 MG TABS Take 20 mg by mouth at bedtime.      meloxicam (MOBIC) 15 MG tablet Take 1 tablet by mouth daily as needed.     PARoxetine (PAXIL) 40 MG tablet Take 40 mg by mouth daily.      Rosuvastatin Calcium 20 MG CPSP 1 tablet     No current facility-administered medications  for this visit.   Facility-Administered Medications Ordered in Other Visits  Medication Dose Route Frequency Provider Last Rate Last Admin   0.9 %  sodium chloride infusion   Intravenous Continuous Boisseau, Hayley, PA        REVIEW OF SYSTEMS:   Constitutional: ( - ) fevers, ( - )  chills , ( - ) night sweats Eyes: ( - ) blurriness of vision, ( - ) double vision, ( - ) watery eyes Ears, nose, mouth, throat, and face: ( - ) mucositis, ( - ) sore throat Respiratory: ( - ) cough, ( - ) dyspnea, ( - ) wheezes Cardiovascular: ( - ) palpitation, ( - ) chest discomfort, ( - ) lower extremity swelling Gastrointestinal:  ( - ) nausea, ( - ) heartburn, ( - ) change in bowel habits Skin: ( - ) abnormal skin rashes Lymphatics: ( - ) new lymphadenopathy, ( - ) easy bruising Neurological: ( - ) numbness, ( - ) tingling, ( - ) new weaknesses Behavioral/Psych: ( - ) mood change, ( - ) new changes  All other systems were reviewed with the patient and are negative.  PHYSICAL EXAMINATION: GENERAL: well appearing elderly Caucasian female. alert, no distress and comfortable SKIN: scalp lesion reduced to a concave dent in scalp. skin color, texture, turgor are normal, no rashes or significant lesions EYES: conjunctiva are pink and  non-injected, sclera clear LYMPH:  no palpable lymphadenopathy in the cervical, axillary or supraclavicular LUNGS: clear to auscultation and percussion with normal breathing effort HEART: regular rate & rhythm and no murmurs and no lower extremity edema Musculoskeletal: no cyanosis of digits and no clubbing  PSYCH: alert & oriented x 3, fluent speech NEURO: no focal motor/sensory deficits  LABORATORY DATA:  I have reviewed the data as listed    Latest Ref Rng & Units 11/16/2023    2:29 PM 05/11/2023    3:25 PM 11/10/2022    2:14 PM  CBC  WBC 4.0 - 10.5 K/uL 5.1  5.7  5.8   Hemoglobin 12.0 - 15.0 g/dL 19.1  47.8  29.5   Hematocrit 36.0 - 46.0 % 44.5  42.9  42.2   Platelets 150 - 400 K/uL 191  180  196        Latest Ref Rng & Units 11/16/2023    2:29 PM 05/11/2023    3:25 PM 11/10/2022    2:14 PM  CMP  Glucose 70 - 99 mg/dL 621  308  657   BUN 8 - 23 mg/dL 12  15  13    Creatinine 0.44 - 1.00 mg/dL 8.46  9.62  9.52   Sodium 135 - 145 mmol/L 142  142  142   Potassium 3.5 - 5.1 mmol/L 3.8  3.9  4.2   Chloride 98 - 111 mmol/L 107  108  107   CO2 22 - 32 mmol/L 32  28  30   Calcium 8.9 - 10.3 mg/dL 8.9  8.9  8.9   Total Protein 6.5 - 8.1 g/dL 6.1  6.1  6.2   Total Bilirubin 0.0 - 1.2 mg/dL 0.4  0.4  0.4   Alkaline Phos 38 - 126 U/L 89  86  81   AST 15 - 41 U/L 19  25  21    ALT 0 - 44 U/L 15  32  18     No results found for: "MPROTEIN" No results found for: "KPAFRELGTCHN", "LAMBDASER", "KAPLAMBRATIO"   RADIOGRAPHIC STUDIES:  No results found.  ASSESSMENT &  PLAN CHRISIE JANKOVICH 73  y.o. female with medical history significant for diffuse non Hodgkin B cell lymphoma who presents for a follow up visit.  After review of the imaging, review the labs, discussion with the patient her findings are most consistent with a diffuse non-Hodgkin B-cell lymphoma, follicular lymphoma.  The initial diagnosis based off the biopsy from the lesion on the head was that of a follicle cell  lymphoma which tends to be a cutaneous lymphoma, however this diagnosis was considerably less likely given the diffuse spread throughout the lymph nodes of the chest and abdomen. Biopsy of the FDG avid lesion in the pelvis resulted with follicular lymphoma.   Previously we discussed the diagnosis of follicular lymphoma and the options moving forward.  She currently does not meet any GELF Criteria and therefore is not required to start therapy.  We discussed that treatment could be started if she so desired and we discussed the risks and benefits of bendamustine rituximab therapy.  She voiced her understanding of the different options between starting treatment and continued observation.  After discussion with her husband she noted she would like to start treatment given the continued growth of the lesion which was abutting the urinary bladder.   The regimen of choice for this patient is Rituximab monotherapy with Rituximab 375mg /m2 IV weekly x 4 weeks, followed by IV rituximab q 8 weeks thereafter. This is being administered with curative intent. We transitioned to this regimen from bendamustine rituximab due to intolerance and hospitalization following her first cycle of treatment.  # Non-Hodgkin B Cell Lymphoma, Follicular Lymphoma. Stage III --pre treatment CT findings showed lymphadenopathy on both sides of the diaphragm. Consistent with at least a Stage III lymphoma -- confirmed the diagnosis with biopsy of the FDG avid mass near the uterus/cervix. Cancel the left axillary lymph node biopsy --started treatment with R-Benda, however the patient ended up hospitalized due to fever, weakness, and marked drop in her labs.  She did not tolerate rituximab and bendamustine combination altogether and therefore we proceeded with rituximab monotherapy alone. She completed Rituximab weekly x 4 weeks.  Plan:  --CT imaging as needed moving forward.  -- Patient has completed maintenance rituximab --labs today  show WBC 5.1, Hgb 14.6, MCV 94.5, Plt 191  --RTC in 6 months time.  At next visit could consider extending to every 12 months if everything is stable.  # Leukopenia-resolved. -- Unclear etiology of leukopenia, improved on last 2 visits to normal levels.   #Fibrosing Mesenteritis (Mesenteric Panniculitis), resolved --improved with prednisone taper, complete resolution of symptoms.  --completed steroid taper.  --continue to monitor   #Supportive Care --port removed --zofran 8mg  q8H PRN and compazine 10mg  PO q6H for nausea -- EMLA cream for port -- no pain medication required at this time.   No orders of the defined types were placed in this encounter.  All questions were answered. The patient knows to call the clinic with any problems, questions or concerns.  A total of more than 30 minutes were spent on this encounter and over half of that time was spent on counseling and coordination of care as outlined above.   Ulysees Barns, MD Department of Hematology/Oncology [facility] Phone: [phone] Pager: [phone] Email: [email or web address]  11/18/2023 3:12 PM

## 2023-11-16 NOTE — Progress Notes (Signed)
 Outpatient Follow Up Visit      Name: Lindsey Huynh Patient ID: [id number]  Age: 73 y.o. Sex: female Date of Birth: [birth year]   Author: Unk Pinto, MD    Reason for Presentation: FU CAD    ASSESSMENT AND PLAN  Coronary disease--No angina. currently stable on present medications.       Chronic systolic heart failure--minimal heart failure symptoms, currently class II.  Continue Entresto and other medications      DM--controlled on present medications. Hgb A1c down to 6.9%. Cont Jardiance.    Increased cholesterol--continue atorvastatin.  Last LDL was high so added zetia. Recheck Chol next visit     CKD stage 3--is being followed up by renal. 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 female with a PMH coronary disease with bypass surgery April 2022, chronic systolic heart failure, prior cardiac arrest, hypertension and diabetes with increased cholesterol who returns to clinic for follow-up.        Since her last visit she has been stable.   She reports having intermittent chest pain which she relates to her scar from her bypass surgery; no angina.   Plavix stopped at 1 year currently on low-dose aspirin.     She has no significant shortness of breath. Can walk 1-2 blocks before getting SOB. Can go up a flight of steps without problem.      Has some lower extremity edema especially on the right which she relates to prior PAD surgery. She sleeps on 3 pillows at night but does not wake up shortness of breath.  She intermittently gets around with a cane to help improve her balance. Today came in without it and did fine walking, although slowly     Remains on [place] for heart failure.  Echo previously was 40-45%.  Repeat echo July 2023 showed LVH with LVEF 40-45% with inferior AK. Mild MR. takes Lasix every other day for fluid accumulation     Blood pressures been improved at home with systolics  typically in the 130s/80s range. Rare in 120s.  She denies dizziness or lightheadedness, palpitations, rapid or slow heartbeats.     She reports her glucoses are well controlled on present medications.  HgbA1C down to 6.9%.   Glucoses running 120-160 when she checks them at home.   Takes gabapentin intermittently for her neuropathy on her feet.     Renal function worsened on serial checks; most recent in feb 2025 showed Significant improvement with BUN/creatinine 22/0.9 with GFR of 66.  Is now FU with renal.     Referred to oncology due to potential for MUGS.      Last chol check showed total cholesterol 143, HDL 50 and LDL of 65    Had previous L LE bypass; subseqeuntly underwent  R lower extremity revascularization June 2023.  Currently with no claudication.  Review of Systems: As per HPI; all other systems negative    Medications:  Current Outpatient Medications   Medication Instructions   . acetaminophen (TYLENOL) 650 mg, Every 8 hours PRN   . atorvastatin (LIPITOR) 80 mg, Oral, Daily   . carvedilol (COREG) 6.25 mg, Oral, 2 times daily with meals   . empagliflozin (JARDIANCE) 10 mg, Oral, Daily   . ergocalciferol (VITAMIN D-2) 50,000 Units   . ezetimibe (ZETIA) 10 mg, Oral, Daily   . furosemide (LASIX) 20 mg, Oral, As  needed   . sacubitril-valsartan (Entresto) 49-51 mg tablet 1 tablet, Oral, 2 times daily   . spironolactone (ALDACTONE) 25 mg, Daily       Past Medical History:      Diagnosis Date   . Essential hypertension 10/29/2020   . Hypertension    . Type 2 diabetes mellitus, with long-term current use of insulin (CMS/HCC)        Social History:  She reports that she has been smoking cigarettes. She has never used smokeless tobacco. She reports that she does not currently use alcohol after a past usage of about 1.0 standard drink of alcohol per week. She reports that she does not currently use drugs.      Physical Exam  Last Recorded Vitals:  Visit Vitals  BP 135/72 (BP Location: Left arm, Patient Position:  Sitting, BP Cuff Size: Adult long)   Pulse (!) 51   Resp 12   Ht 1.626 m   Wt 74.1 kg   SpO2 99%   BMI 28.05 kg/m   OB Status Hysterectomy   Smoking Status Some Days   BSA 1.83 m       Constitutional:  Not in acute distress.  Eyes:  No scleral icterus.  Cardiovascular:  regular rate and regular rhythm.    Normal heart sounds.  No murmur  Pulmonary:     No respiratory distress.  No wheezes, rales or accessory muscle use  Abdominal:  No distension.  Soft; no organomegaly. No abdominal tenderness.    Extremeties:  No edema  Skin: warm; no breakdown.   Neurological: intact strength      DATA REVIEWED BY ME:   LABS  Lab Results   Component Value Date    NA 139 07/06/2022    K 4.3 07/06/2022    CL 106 07/06/2022    CO2 23 07/06/2022    BUN 33 (H) 07/06/2022    CREATININE 1.44 (H) 07/06/2022    CALCIUM 9.5 07/06/2022    ALBUMIN 3.9 12/28/2020    PROT 5.3 (L) 12/28/2020    BILITOT 0.5 12/28/2020    ALKPHOS 90 12/28/2020    ALT <6 12/28/2020    AST 16 12/28/2020    MG 1.9 07/06/2022    GLUCOSE 172 (H) 07/06/2022     Lab Results   Component Value Date    WBC 9.8 01/01/2021    HGB 10.7 (L) 01/01/2021    HCT 34.0 (L) 01/01/2021    PLT 273 01/01/2021    NEUTOPHILPCT 52.8 12/09/2020    LYMPHOPCT 26.1 12/09/2020    MONOPCT 8.3 12/09/2020    EOSPCT 11.9 12/09/2020      Lab Results   Component Value Date    CHOL 160 06/07/2021    TRIG 70 06/07/2021    HDL 56 06/07/2021      Lab Results   Component Value Date    APTT 48 (H) 12/31/2020    INR 1.2 12/31/2020      Lab Results   Component Value Date    HGBA1C 6.9 (H) 12/09/2020      Lab Results   Component Value Date    TSH 3.35 10/26/2020    FREET4 1.46 10/30/2018      Lab Results   Component Value Date    PROBNP 293 (H) 07/06/2022    PROBNP 304 (H) 01/03/2022    PROBNP 752 (H) 02/10/2021    PROBNP 614 (H) 11/04/2020    BNP 239 (H) 10/26/2020  IMAGING AND OTHER STUDIES    Echo/Echo Stress:  Enc. date: 04/13/22    TRANSTHORACIC ECHO (TTE) COMPLETE    Narrative  [facility]                       +------------+  +--------------------------+      Center                        :            :  :                          : [address]                   :            :  :                          [place]                        :            :  [place], [place]                     :            :  :                          :      [id number]                         :            :  +--------------------------+ Phone: [phone]                    [phone]  [phone]    Transthoracic Echocardiography Report  +-----------------------------------------------------------------------------+  :Name: Farris Hong         Study Date: 04/13/2022 01:46 PM             :  :Attending Physician: Jaime Mayans                           Accession#: [id number]                      :  :MRN: [id number]                    Patient Location: Heart Station - OP        :  :DOB: [birth year]                  Gender: Female                              :  :  Age: 20 yrs                      BP: 152/89 mmHg                             :  :Height: 64 in                    Weight: 174 lb                              :  :                                                                             :  :BSA: 1.8 m2                                                                  :  :Heart Rate: 63                                                               :  :Reason For Study: Acute systolic                                             :  :CHF                                                                          :  :History: Acute CHF                                                           :  +-----------------------------------------------------------------------------+    PROCEDURE  Procedure(CPT Code): TTE Complete ([address]-60) 2D with Doppler and Color Flow:  No add on codes required). A injection of Lumason contrast agent was performed  to  improve image quality. A total of 4 cc of contrast was given.    Interpretation Summary  Quality adequate  1. Concentric LV remodeling with mildly depressed LV systolic function.  Estimated LVEF 40 to 45%. 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 Diastolic dysfunction with normal left atrial pressures  3. Normal RV size and systolic function  4. Biatrial sizes normal  5. Mild mitral regurgitation. Sclerotic cardiac valves  6. Normal size aortic root  7. Normal IVC size and estimated right atrial pressure    LEFT VENTRICLE  Normal left ventricular cavity size. There is concentric left ventricular  remodeling. LV ejection fraction = 40-45%. There is mild left ventricular  diastolic dysfunction with normal left atrial pressure. Akinesis of the  inferolateral wall and hypokinesis inferior wall with a small aneurysmal area  in the basal inferior wall.    RIGHT VENTRICLE  The right ventricle is normal size. The right ventricular systolic function is  normal.    LEFT ATRIUM  The left atrial size is normal.    RIGHT ATRIUM  Right atrial size is normal.    AORTIC VALVE  The aortic valve is trileaflet. There is mild aortic valve thickening. There  is no aortic stenosis. There is no aortic regurgitation.    MITRAL VALVE  There are mildly thickened mitral valve leaflets. There is mild mitral  regurgitation.    TRICUSPID VALVE  Structurally normal tricuspid valve. There is trivial tricuspid regurgitation.  There is insufficient tricuspid regurgitation to estimate right ventricular  systolic pressure.    PULMONIC VALVE  Structurally normal pulmonic valve. Trace pulmonic valvular regurgitation.    ARTERIES  The aortic root is normal size. Maximal visible dimension is 3.2 cm. The  proximal ascending aorta appears normal. The transverse aorta is not well  visualized. The proximal descending aorta is not well visualized. The  pulmonary artery is not  well visualized.    VENOUS  The inferior vena cava is normal in size.    EFFUSION  Insignificant pericardial effusion or subepicardial fat.  +-----------------------------------------------------------------------------+  :Normal Values                                                                :  :IVSd: 0.7cm - 1.2cm  LVIDd: 3.5cm - 5.5cm        LVIDs: 2.5cm - 4.0cm        :  :LVPWd: 0.7cm - 1.1cm LA: 1.9cm - 3.8cm           Ao: 2.0cm - 3.7cm           :  :EF: (55 - 75%)       LA Area: <>                 RA Area: <>                 :  :RVd: 4.3cm           LV Mass(Men): <>            LV Mass(Women): <>          :  :                     LV Mass Index(Men): <116g>  LV Mass Index(Women): <96g> :  +-----------------------------------------------------------------------------+  MMode/2D Measurements & Calculations  IVSd: 0.98 cm        LVIDd: 4.2 cm  LV mass(C)d: 145.7 grams  LVIDs: 3.8 cm          LV mass(C)dI: 78.8 grams/m2  LVPWd: 1.1 cm    ______________________________________________________________________________  Ao root diam: 3.3 cm asc Aorta Diam: 3.6 cm LA/Ao: 0.96  LA dimension: 3.1 cm                        LVOT diam: 2.1 cm    ______________________________________________________________________________  EDV(MOD-sp4):        LVLd ap2: 7.9 cm       TAPSE: 1.2 cm  106.0 ml             EDV(MOD-sp2): 112.0 ml  LVLs ap4: 7.0 cm     LVLs ap2: 7.4 cm  ESV(MOD-sp4): 68.0 mlESV(MOD-sp2): 59.1 ml  EF(MOD-sp4): 35.8 %  EF(MOD-sp2): 47.2 %    ______________________________________________________________________________  MR Jet Area_: 4.0 cm2RA ESA: 11.6 cm2       RV Base: 3.1 cm    ______________________________________________________________________________  LA A4Cs: 17.4 cm2    LA ESV (MOD-BP):       LA volume MOD BP Indexed:  45.6 ml                24.6 ml/m2    Time Measurements  Aortic R-R: 1.1 sec  Aortic HR: 57.0 BPM    Doppler Measurements & Calculations  MV E max vel: 55.8 cm/sec MV dec slope:  135.4 cm/sec2  Ao V2 max: 92.3 cm/sec  MV A max vel: 77.1 cm/sec MV dec time: 0.41 sec        Ao max PG: 3.0 mmHg  MV E/A: 0.72                                           AVA(V,D): 2.9 cm2      ______________________________________________________________________________  LV V1 max PG: 2.0 mmHg    CO(LVOT): 3.5 l/min          AV VR: 0.85  LV V1 mean PG: 1.3 mmHg   SV(LVOT): 61.4 ml  LV V1 max: 78.8 cm/sec  LV V1 mean: 52.9 cm/sec  LV V1 VTI: 18.2 cm    ______________________________________________________________________________  MV P1/2t-pr_: 120.7 msec  RV S Vel: 7.3 cm/sec         MV LAT E': 7.1 cm/sec    ______________________________________________________________________________  MV LAT E/E': 7.9          MV MED E': 4.1 cm/sec        MV MED E/E': 13.5    ______________________________________________________________________________  Electronically Signed By:  Jeane Miguel  on 04/13/2022 08:16 PM  Performed By: Vola Grow  MRN: [id number]      Cath:  Enc. date: 11/26/20    CARDIAC CATHETERIZATION    Narrative  Table formatting from the original result was not included.  Patient Name: Alaisha Eversley  Patient DOB: [birth year] Patient MRN: [id number]  Procedure Date: 11/26/20 Procedure Time: 1055  Service: OUTPATIENT CATH Location: [facility] MAIN CARDIAC CATH  Physicians/Surgeons:  * Cleta Daisy - Primary  * Willow Harvard - Fellow Staff:  Circulator: Lilla Reichert, RN  Scrub Person: Otilio Block, RT  Documenter: Deretha Fleck, RN; Trinidad Funk, RN  Team C: TEAM C      Pre-Procedure Diagnosis Post-Procedure Diagnosis  Pre-op Diagnosis  * Cardiac arrest (CMS/HCC) [I46.9]  * Acute HFrEF (heart failure with reduced ejection fraction) (CMS/HCC) [  I50.21]  Abnormal stress test 1. Right dominant circulation  2. Severe  coronary disease involving the proximal LAD, LCX (CTO), RCA, and RPLB (CTO)  3. Normal left heart filling pressure  4. Elevated systemic arterial pressure      Procedure Details  The risks, benefits,  complications, treatment options, and expected outcomes were discussed with the patient. The patient and/or family concurred with the proposed plan, giving informed consent.  Indication(s)  Breionna Punt is an 73 y.o. female who is having a procedure for Abnormal stress test.  Procedures Performed  1. Left heart catheterization  2. Coronary angiography  3. Moderate sedation      Attending MD Pre-Sedation Reassessment  The patient was reassessed for airway, cardiovascular and respiratory status immediately prior to the procedural sedation. Patient remained/remains a candidate for the planned procedural sedation.  Anesthesia/Sedation Fentanyl, Versed, and local Lidocaine.  Sedation Times Event Time In  Sedation Start 1058  Sedation Stop 1120    Access Site(s) SR Access Site: Right radial artery  Estimated Blood Loss Minimal  Drains None.  Specimens None.  Complications None.  Stents/Implants  None.  Patient Condition At End of Procedure  stable.      PROCEDURAL FINDINGS    Left Heart Catheterization  Left Ventricular Pressure (Systolic/EDP) 190/8 mmHg  Aortic Pressure 190/80 mmHg    Coronary Angiography  Left Main Coronary Artery Large caliber vessel with luminal irregularities.  Ramus Intermedius (RI) Moderate caliber vessel with luminal irregularities.  Left Anterior Descending (LAD) Large caliber vessel that wraps the apex and has 70% proximal stenosis.  First Diagonal (D1) Small caliber vessel with luminal irregularities.  Second Diagonal (D2) Very small caliber vessel.  Left Circumflex (LCX) Large caliber vessel with a CTO after the takeoff of OM1. Distal territory fills by left-to-left collaterals.  First Obtuse Marginal (OM1) Moderate caliber vessel with luminal irregularities.  Second Obtuse Marginal (OM2) Fills retrograde by left-to-left collaterals. Small-moderate caliber vessel diffuse luminal irregularities.  Right Coronary (RCA) Large caliber dominant vessel with 70% proximal and 80% mid segment  stenoses.  Posterior Descending (PDA) Moderate caliber vessel with an early takeoff from the RCA and luminal irregularities.  Posterolateral Branch (PLB) Moderate caliber vessel with a CTO in the proximal segment. Fills by right-to-right collaterals.  Dominance Right.      Impression and Recommendations  1. Right dominant circulation  2. Severe  coronary disease involving the proximal LAD, LCX (CTO), RCA, and RPLB (CTO)  3. Normal left heart filling pressure  4. Elevated systemic arterial pressure    Recommend CT surgery evaluation for CABG and optimization of anti-hypertensive management.      Attending Attestation    I was present for the entire procedure. I have edited the note above to reflect my interpretation of the procedural findings.      MRI:  No results found for this or any previous visit.      CT:  Enc. date: 10/26/20    CT HEAD WO CONTRAST    Narrative  STUDY: CT OF THE HEAD WITHOUT IV CONTRAST    HISTORY: post cardiac arrest    TECHNIQUE: CT imaging was performed through the head without IV contrast. Axial, sagittal, and coronal images are provided.    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 There is no abnormal density in the brain parenchyma and gray-white differentiation is maintained. There is no mass-effect or midline shift. There is  no acute intracranial hemorrhage. The size, shape and configuration of the ventricles and sulci are unremarkable. Basal cisterns are clear.    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 The paranasal sinuses and mastoid air cells are clear. No fracture is demonstrated. Endotracheal and orogastric tubes are seen.    Impression  Final report  No acute intracranial abnormality.  Chronic changes as above.    Dictated By: Rosea Conch 10/26/2020 10:10 PM    I have reviewed the images and dictated, reviewed or edited the final  report    Dictated By: Rosea Conch  Electronically Verified by: Ahmet Baykal 10/27/2020 6:02 AM      Nuclear Medicine:  No results found for this or any previous visit.      Stress Testing:  No results found for this or any previous visit.

## 2024-01-01 NOTE — Telephone Encounter (Signed)
 Attempted to contact patient regarding upcoming Medicare wellness appointment and completion of HRA questionnaire. LVM for patient to please return call at  [phone].

## 2024-01-02 NOTE — Telephone Encounter (Signed)
 Contacted patient regarding upcoming Medicare wellness appointment and completion of HRA questionnaire. Questionnaire completed via phone.

## 2024-01-03 ENCOUNTER — Ambulatory Visit
Admit: 2024-01-03 | Discharge: 2024-01-03 | Payer: Medicare (Managed Care) | Attending: Student in an Organized Health Care Education/Training Program | Primary: Student in an Organized Health Care Education/Training Program

## 2024-01-03 ENCOUNTER — Encounter

## 2024-01-03 VITALS — BP 148/82 | HR 54 | Temp 97.50000°F | Wt 157.4 lb

## 2024-01-03 DIAGNOSIS — Z0000 Encounter for general adult medical examination without abnormal findings: Secondary | ICD-10-CM

## 2024-01-03 MED ORDER — GABAPENTIN 300 MG PO CAPS
300 | ORAL_CAPSULE | Freq: Every evening | ORAL | 1 refills | Status: DC | PRN
Start: 2024-01-03 — End: 2024-08-21

## 2024-01-03 MED ORDER — SPIRONOLACTONE 50 MG PO TABS
50 | ORAL_TABLET | Freq: Every day | ORAL | 3 refills | Status: AC
Start: 2024-01-03 — End: ?

## 2024-01-03 NOTE — Progress Notes (Signed)
 RM 13    Chief Complaint   Patient presents with    Medicare AWV       Vitals:    01/03/24 1008 01/03/24 1009   BP: (!) 151/85 (!) 148/82   BP Site: Left Upper Arm Right Upper Arm   Patient Position: Sitting Sitting   Pulse: 54    Temp: 97.5 F (36.4 C)    TempSrc: Oral    Weight: 71.4 kg (157 lb 6.4 oz)         "Have you been to the ER, urgent care clinic since your last visit?  Hospitalized since your last visit?"    NO    "Have you seen or consulted any other health care providers outside of [facility] since your last visit?"    NO    Have you had a mammogram?"   NO    No breast cancer screening on file         "Have you had a colorectal cancer screening such as a colonoscopy/FIT/Cologuard?    NO    No colonoscopy on file  No cologuard on file  No FIT/FOBT on file   No flexible sigmoidoscopy on file         Click Here for Release of Records Request   AVS  education, follow up, and recommendations provided and addressed with patient. Interpreter services used to advise patient No

## 2024-01-03 NOTE — Progress Notes (Signed)
 Lindsey Huynh (DOB:  July 03, 1951) is a 73 y.o. female, Established patient, here for evaluation of the following chief complaint(s):  Medicare AWV        Subjective   SUBJECTIVE/OBJECTIVE:  Patient presents today for follow-up and medicare wellness    Chronic problems:    Type 2 Diabetes Mellitus  - Home BG checks: Does not check frequently, they run between 90-120  - Home medications include: Jardiance 10 mg daily - she is not taking metformin  - Taking medications as prescribed without side effects  - Diet: Reviewed over diet, discussed controlling carb intake, decreasing intake of bread, pasta, rice, potatoes, corn and sugary drinks/items  - Exercise: Encouraged regular exercise - 150 minutes/week  - ROS: No vision changes, changes in urination, numbness/tingling  - Preventative care:  - Last HgbA1c: 7.4 on 01/25/23  - Last urine micro: 10/17/23 - elevated   - Last eye exam was: 01/25/23   - Last foot exam was : 01/25/23  - Statin: Atorvastatin 80 mg daily  - ACE-I/ARB: Entresto  - Pneumonia vaccine: 06/04/2016    She want Gabapentin for neuropathy but does not use it regularly    Hypertension  - BP today 117/77  - Sees cardiology - she is on carvedilol, Entresto, spironolactone    Hyperlipidemia  - Last panel 01/25/23 - LDL 64.6 - patient on Lipitor 80 mg daily and Zetia 10 mg daily    Heart Failure/Coronary Artery Disease - Sees Cardiologist with [facility] - Dr. Leonia Corona and Florentina Jenny NP  - Patient is on ASA 81 mg, Coreg 6.25 mg BID  - She is also on Entresto 49-51 BID and Aldactone 25 mg daily    CKD2 - sees Nephrologist is Dr. Ardyth Man, MD    Vitamin D deficiency - ON vitamin D being managed by nephrologist    Preventative care:  Health Maintenance Due   Topic Date Due    Diabetic Alb to Cr ratio (uACR) test  Never done    Diabetic retinal exam  Never done    Breast cancer screen  Never done    Colorectal Cancer Screen  Never done    COVID-19 Vaccine (3 - 2024-25 season) 05/21/2023    A1C test (Diabetic or  Prediabetic)  01/25/2024    Lipids  01/25/2024    GFR test (Diabetes, CKD 3-4, OR last GFR 15-59)  01/25/2024      Had normal colonoscopy about 5 years ago in [place] - states she is good for 10 years    Past Medical History:   Diagnosis Date    Arthritis     CAD (coronary artery disease)     Cardiac arrest 11/12/2020    Formatting of this note might be different from the original.   Added automatically from request for surgery [phone]  Formatting of this note might be different from the original.   Formatting of this note might be different from the original.   Added automatically from request for surgery [phone]      Last Assessment & Plan:    Formatting of this note might be different from the original.   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   -History  of right femoral-popliteal bypass in June 2023   -History of left femoropopliteal bypass in 2021 by Dr. Elizebeth Gums    GERD (gastroesophageal reflux disease)     Hypercholesterolemia     Hypertension     Ischemic hepatitis 10/29/2020    Formatting of this note might be different from the original.   Had been downtrending in the hospital. Likely 2/2 her PEA arrest      Last Assessment & Plan:    Formatting of this note might be different from the original.   Hepatic panel on 2/11: Alt 156, alk phos 177   CMP on 2/16: ALT 56, alk phos 143   Both down trending   -Recheck CMP on 12/16/20  Formatting of this note might be different fro    PAD (peripheral artery disease)     Pneumonia due to COVID-19 virus 10/29/2020    Formatting of this note might be different from the original.   She is s/p dexamethasone and remdesevir.      Last Assessment & Plan:    Formatting of this note might be different from the original.   Encouraged patient to get her booster.  She stated she would call [facility] now and get it scheduled    Pneumonia due to COVID-19 virus 10/29/2020    Formatting of this note might be different from the original.   Formatting of this note might be different from the original.   She is s/p dexamethasone and remdesevir.      Last Assessment & Plan:    Formatting of this note might be different from the original.   Encouraged patient to get her booster. She stated she would call [facility] now and get it scheduled    Stage 2 acute kidney injury 10/29/2020    Last Assessment & Plan:    Formatting of this note might be different from the original.   Lasix today to keep negative   AKI resolving  Formatting of this note might be different from the original.   Last Assessment & Plan:    Formatting of this note might be different from the original.   Lasix today to keep negative   AKI resolving      Last Assessment & Plan:    Formatting of this note might b     Past Surgical History:   Procedure Laterality Date    COLONOSCOPY  2019    CORONARY ARTERY BYPASS GRAFT  12/25/2020    x2, [facility]     CORONARY ARTERY BYPASS GRAFT  12/25/2020    EYE SURGERY Right     CATARACT REMOVAL    FEMORAL BYPASS Left 11/28/2019     Dr Celestino Cole    FEMORAL BYPASS Right 02/16/2022    RIGHT FEMORAL POPLITEAL BYPASS WITH PTFE, ANGIOPLASTY OF POPLITEAL AND TIBIAL ARTERIES performed by Sherra Dk, MD at [facility] MAIN OR    FOOT AMPUTATION Left 12/05/2019    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 Argyle Began    HYSTERECTOMY (CERVIX STATUS UNKNOWN)  1983    ORTHOPEDIC SURGERY  06-30-10    back surgery (tumor removed)    TUMOR REMOVAL  06/30/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      Family History   Problem Relation Age of Onset    Heart Disease Mother         CHF    High Blood Pressure Mother     High Blood Pressure Father     Diabetes Father  Heart Disease Sister     Heart Attack Sister     Cancer Sister         THYROID    Seizures Brother     Anesth Problems Neg Hx      Social History      Socioeconomic History    Marital status: Single     Spouse name: Not on file    Number of children: Not on file    Years of education: Not on file    Highest education level: Not on file   Occupational History    Not on file   Tobacco Use    Smoking status: Former     Current packs/day: 0.00     Average packs/day: 0.5 packs/day for 10.0 years (5.0 ttl pk-yrs)     Types: Cigarettes     Start date: 35     Quit date: 2003     Years since quitting: 22.3     Passive exposure: Current    Smokeless tobacco: Never   Vaping Use    Vaping status: Never Used   Substance and Sexual Activity    Alcohol use: Yes     Alcohol/week: 3.0 standard drinks of alcohol     Types: 1 Cans of beer, 2 Shots of liquor per week     Comment: SOCIALLY    Drug use: No    Sexual activity: Yes     Partners: Male   Other Topics Concern    Not on file   Social History Narrative    Not on file     Social Drivers of Health     Financial Resource Strain: Low Risk  (03/01/2022)    Overall Financial Resource Strain (CARDIA)     Difficulty of Paying Living Expenses: Not hard at all   Food Insecurity: No Food Insecurity (01/03/2024)    Hunger Vital Sign     Worried About Running Out of Food in the Last Year: Never true     Ran Out of Food in the Last Year: Never true   Transportation Needs: No Transportation Needs (01/03/2024)    PRAPARE - Therapist, art (Medical): No     Lack of Transportation (Non-Medical): No   Physical Activity: Insufficiently Active (01/02/2024)    Exercise Vital Sign     Days of Exercise per Week: 7 days     Minutes of Exercise per Session: 20 min   Stress: Not on file   Social Connections: Not on file   Intimate Partner Violence: Not on file   Housing Stability: Low Risk  (01/03/2024)    Housing Stability Vital Sign     Unable to Pay for Housing in the Last Year: No     Number of Times Moved in the Last Year: 0     Homeless in the Last Year: No       Current Outpatient Medications:     pantoprazole  (PROTONIX) 20 MG tablet, Take 1 tablet by mouth daily, Disp: , Rfl:     ezetimibe (ZETIA) 10 MG tablet, Take 1 tablet by mouth daily, Disp: , Rfl:     spironolactone (ALDACTONE) 50 MG tablet, Take 0.5 tablets by mouth daily, Disp: 90 tablet, Rfl: 3    gabapentin (NEURONTIN) 300 MG capsule, Take 1 capsule by mouth nightly as needed (Neuropathy) for up to 180 days. Max Daily Amount: 300 mg, Disp: 90 capsule, Rfl: 1    ergocalciferol (ERGOCALCIFEROL) 1.25 MG (50000  UT) capsule, Take 1 capsule by mouth once a week, Disp: , Rfl:     atorvastatin (LIPITOR) 80 MG tablet, Take 1 tablet by mouth Daily with supper, Disp: , Rfl:     carvedilol (COREG) 6.25 MG tablet, Take 1 tablet by mouth 2 times daily (with meals), Disp: , Rfl:     sacubitril-valsartan (ENTRESTO) 49-51 MG per tablet, Take 1 tablet by mouth 2 times daily, Disp: , Rfl:     furosemide (LASIX) 20 MG tablet, Take 1 tablet by mouth as needed WEIGHT GAIN, Disp: , Rfl:     aspirin 81 MG EC tablet, Take by mouth daily, Disp: , Rfl:     empagliflozin (JARDIANCE) 10 MG tablet, Take by mouth daily, Disp: , Rfl:     metFORMIN (GLUCOPHAGE-XR) 500 MG extended release tablet, Take 2 tablets by mouth Daily with supper, Disp: , Rfl:   No Known Allergies    Review of Systems   Constitutional:  Negative for chills and fever.   Respiratory:  Negative for cough and shortness of breath.    Cardiovascular:  Negative for chest pain and palpitations.   Gastrointestinal:  Negative for abdominal pain, blood in stool, constipation, diarrhea, nausea and vomiting.   Musculoskeletal:  Positive for arthralgias. Negative for myalgias.   Neurological:  Positive for numbness. Negative for dizziness and headaches.   Psychiatric/Behavioral:  Negative for dysphoric mood and sleep disturbance. The patient is not nervous/anxious.           Objective   Vitals:    01/03/24 1008 01/03/24 1009   BP: (!) 151/85 (!) 148/82   BP Site: Left Upper Arm Right Upper Arm   Patient Position: Sitting Sitting    Pulse: 54    Temp: 97.5 F (36.4 C)    TempSrc: Oral    Weight: 71.4 kg (157 lb 6.4 oz)         Physical Exam  Vitals and nursing note reviewed.   Constitutional:       General: She is not in acute distress.     Appearance: Normal appearance. She is not ill-appearing.   HENT:      Mouth/Throat:      Mouth: Mucous membranes are moist.      Pharynx: Oropharynx is clear.   Cardiovascular:      Rate and Rhythm: Regular rhythm.      Pulses:           Dorsalis pedis pulses are 2+ on the right side and 2+ on the left side.      Heart sounds: No murmur heard.  Pulmonary:      Effort: Pulmonary effort is normal.      Breath sounds: Normal breath sounds. No wheezing.   Abdominal:      General: Bowel sounds are normal. There is no distension.      Palpations: Abdomen is soft.      Tenderness: There is no abdominal tenderness.   Musculoskeletal:      Right lower leg: No edema.      Left lower leg: No edema.      Right foot: No deformity.      Left foot: No deformity.      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   Feet:      Right foot:      Protective Sensation: 8 sites tested.  8 sites sensed.      Skin integrity: Dry skin present. No skin breakdown or callus.  Toenail Condition: Right toenails are abnormally thick. Fungal disease present.     Left foot:      Protective Sensation: 4 sites tested.  4 sites sensed.      Skin integrity: Dry skin present. No skin breakdown or callus.      Comments:    Left foot: normal sensation, s/p toe amputation (all 5 toes)   Right foot: normal sensation  Neurological:      Mental Status: She is alert.   Psychiatric:         Mood and Affect: Mood normal.         Behavior: Behavior normal.                Assessment & Plan   ASSESSMENT/PLAN:  1. Medicare annual wellness visit, subsequent  2. Chronic systolic heart failure (HCC)  -     spironolactone (ALDACTONE) 50 MG tablet; Take 0.5 tablets by mouth daily, Disp-90 tablet, R-3Adjust Sig  3. Primary hypertension  -      spironolactone (ALDACTONE) 50 MG tablet; Take 0.5 tablets by mouth daily, Disp-90 tablet, R-3Adjust Sig  4. Arteriosclerosis of coronary artery  Overview:  Last Assessment & Plan:    Formatting of this note might be different from the original.   S/p CABGx2 LIMA to LAD and SVG to OM on 4/8 fast track extubated and weaned off vasoactive gtts immediately post op   - POD1 TTE showing severe reduction in RV function so epi was started and diuresis increased   - AV wires and CT all out on 4/12   - FG even to -500 with lasix 20mg    - cont midodrine 5q8   - asa 162, plavix 75   - will add home coreg when off midodrine  Formatting of this note might be different from the original.   Last Assessment & Plan:    Formatting of this note might be different from the original.   S/p CABGx2 LIMA to LAD and SVG to OM on 4/8 fast track extubated and weaned off vasoactive gtts immediately post op   - POD1 TTE showing severe reduction in RV function so epi was started and diuresis increased   - AV wires and CT all out on 4/12   - FG even to -500 with lasix 20mg    - cont midodrine 5q8   - asa 162, plavix 75   - will add home coreg when off midodrine    Orders:  -     Lipid Panel; Future  5. PVD (peripheral vascular disease)  -     Lipid Panel; Future  6. Status post amputation of left foot (HCC)  7. Hypercholesterolemia  -     Lipid Panel; Future  8. Stage 2 chronic kidney disease  9. Type 2 diabetes mellitus with diabetic nephropathy, without long-term current use of insulin (HCC)  -     HM DIABETES FOOT EXAM  -     Hemoglobin A1C; Future  10. Type 2 diabetes mellitus with diabetic neuropathy, without long-term current use of insulin (HCC)  -     HM DIABETES FOOT EXAM  -     Hemoglobin A1C; Future  -     gabapentin (NEURONTIN) 300 MG capsule; Take 1 capsule by mouth nightly as needed (Neuropathy) for up to 180 days. Max Daily Amount: 300 mg, Disp-90 capsule, R-1Normal  11. Vitamin D deficiency  12. Encounter for screening mammogram for  breast cancer  -     MAM  DIGITAL SCREEN W OR WO CAD BILATERAL; Future  13. Screening for colorectal cancer  -     Cologuard (Fecal DNA Colorectal Cancer Screening)         Patient presents today for follow-up for:  Patient completed all required medicare wellness visit forms and documentation today. A personalized prevention plan was made, reviewed and provided to the patient today. See additional document forms and below for full details. Patient is in good health and no deficits were identified aside from above issues which are chronic and stable with exception of any acute problems or changes made above. All necessary medications were refilled. All other concerns were addressed.  CHF: Chronic and stable. Patient follows with cardiology. Continue medications per cardiology.  Hypertension: Chronic and relatively stable. Continue current medications given that she sees cardiology. BP have been good at home. Follow-u pin 3 months.  CAD: Chronic and stable. Patient follows with cardiology. Continue medications per cardiology.  PVD: Chronic and stable. Patient follows with cardiology. Continue medications per cardiology.  Absence of L forefoot - noted  Hyperlipidemia: Chronic and stable. Lifestyle/dietary changes reviewed. Continue current medications: atorvastatin 80 mg daily. Patient is due for repeat lipid panel which was ordered.  CKD2: Chronic and stable. Patient follows with nephrology. Labs done recently. Avoid nephrotoxic medications. Follow-up pRN  Type 2 diabetes mellitus: Chronic and controlled  Last A1c: 7.4 repeat today  Reviewed over diet/lifestyle  Medications:   Current: Metformin 1000 mg daily with Jardiance 10 mg daily  Changes: None  Preventative care:  Urine microalbumin: 10/17/23   Foot exam: Done today 01/03/24  Pneumonia vaccine: Done 2017  Eye exam: encouraged patient to schedule with eye doctor  Lipid panel: Ordered today   Statin therapy: Atorvastatin 80 mg daily  ACE-I/ARB: Entresto  Follow-up:  3 months  Diabetic neuropathy: Chronic and stable. Patient is on Gabapentin nightly as needed. PDMP reviewed and consistent. Refill sent in.   Vitamin D deficiency: Chronic and stable.  Sees nephrologist who manages. Follow-up PRN  Breast cancer screening was discussed today as patient is due for this. Patient was agreeable to obtaining a mammogram, which was ordered today. Patient encouraged to call to schedule this.  Discussed colon cancer screening with the patient as patient is due for this. Patient opted to have Cologuard done.  Order was placed today.      Return in about 3 months (around 04/03/2024) for Diabetes.         An electronic signature was used to authenticate this note.    --Sims Duck, MD       Medicare Annual Wellness Visit    Annalynn Centanni is here for Medicare AWV    Assessment & Plan   Medicare annual wellness visit, subsequent  Chronic systolic heart failure (HCC)  -     spironolactone (ALDACTONE) 50 MG tablet; Take 0.5 tablets by mouth daily, Disp-90 tablet, R-3Adjust Sig  Primary hypertension  -     spironolactone (ALDACTONE) 50 MG tablet; Take 0.5 tablets by mouth daily, Disp-90 tablet, R-3Adjust Sig  Arteriosclerosis of coronary artery  -     Lipid Panel; Future  PVD (peripheral vascular disease)  -     Lipid Panel; Future  Status post amputation of left foot (HCC)  Hypercholesterolemia  -     Lipid Panel; Future  Stage 2 chronic kidney disease  Type 2 diabetes mellitus with diabetic nephropathy, without long-term current use of insulin (HCC)  -     HM DIABETES FOOT EXAM  -  Hemoglobin A1C; Future  Type 2 diabetes mellitus with diabetic neuropathy, without long-term current use of insulin (HCC)  -     HM DIABETES FOOT EXAM  -     Hemoglobin A1C; Future  -     gabapentin (NEURONTIN) 300 MG capsule; Take 1 capsule by mouth nightly as needed (Neuropathy) for up to 180 days. Max Daily Amount: 300 mg, Disp-90 capsule, R-1Normal  Vitamin D deficiency  Encounter for screening mammogram for  breast cancer  -     MAM DIGITAL SCREEN W OR WO CAD BILATERAL; Future  Screening for colorectal cancer  -     Cologuard (Fecal DNA Colorectal Cancer Screening)       Return in about 3 months (around 04/03/2024) for Diabetes.     Subjective       Patient's complete Health Risk Assessment and screening values have been reviewed and are found in Flowsheets. The following problems were reviewed today and where indicated follow up appointments were made and/or referrals ordered.    Positive Risk Factor Screenings with Interventions:        Drug Use:   DAST-10 Score: (Proxy-Rptd) 1  Interpretation: 1-2 indicates low level use. Recommendation: monitor and re-assess at a later date  Interventions:  Encouraged patient to cut down on THC use given history of heart disease, See AVS for additional education material              Vision Screen:  Do you have difficulty driving, watching TV, or doing any of your daily activities because of your eyesight?: (Proxy-Rptd) No  Have you had an eye exam within the past year?: (!) (Proxy-Rptd) No  Interventions:   Patient encouraged to make appointment with their eye specialist      Advanced Directives:  Do you have a Living Will?: (!) (Proxy-Rptd) No    Intervention:  has NO advanced directive - information provided                     Objective   Vitals:    01/03/24 1008 01/03/24 1009   BP: (!) 151/85 (!) 148/82   BP Site: Left Upper Arm Right Upper Arm   Patient Position: Sitting Sitting   Pulse: 54    Temp: 97.5 F (36.4 C)    TempSrc: Oral    Weight: 71.4 kg (157 lb 6.4 oz)       Body mass index is 27.02 kg/m.                No Known Allergies  Prior to Visit Medications    Medication Sig Taking? Authorizing Provider   pantoprazole (PROTONIX) 20 MG tablet Take 1 tablet by mouth daily Yes [provider]   ezetimibe (ZETIA) 10 MG tablet Take 1 tablet by mouth daily Yes [provider]   spironolactone (ALDACTONE) 50 MG tablet Take 0.5 tablets by mouth daily Yes Unk Lightning, MD   gabapentin (NEURONTIN) 300 MG capsule Take 1 capsule by mouth nightly as needed (Neuropathy) for up to 180 days. Max Daily Amount: 300 mg Yes Unk Lightning, MD   ergocalciferol (ERGOCALCIFEROL) 1.25 MG (50000 UT) capsule Take 1 capsule by mouth once a week Yes [provider]   atorvastatin (LIPITOR) 80 MG tablet Take 1 tablet by mouth Daily with supper Yes [provider]   carvedilol (COREG) 6.25 MG tablet Take 1 tablet by mouth 2 times daily (with meals) Yes [provider]   sacubitril-valsartan (ENTRESTO) 49-51 MG  per tablet Take 1 tablet by mouth 2 times daily Yes [provider]   furosemide (LASIX) 20 MG tablet Take 1 tablet by mouth as needed WEIGHT GAIN Yes [provider]   aspirin 81 MG EC tablet Take by mouth daily Yes Automatic Reconciliation, Ar   empagliflozin (JARDIANCE) 10 MG tablet Take by mouth daily Yes Automatic Reconciliation, Ar   metFORMIN (GLUCOPHAGE-XR) 500 MG extended release tablet Take 2 tablets by mouth Daily with supper Yes Automatic Reconciliation, Ar       CareTeam (Including outside providers/suppliers regularly involved in providing care):   Patient Care Team:  Unk Lightning, MD as PCP - General (Family Medicine)  Unk Lightning, MD as PCP - Empaneled Provider  Florentina Jenny, APRN - NP (Nurse Practitioner)  Peri Jefferson, DPM (Podiatry)     Recommendations for Preventive Services Due: see orders and patient instructions/AVS.  Recommended screening schedule for the next 5-10 years is provided to the patient in written form: see Patient Instructions/AVS.     Reviewed and updated this visit:  Tobacco  Allergies  Meds  Problems  Med Hx  Surg Hx  Fam Hx  Sexual   Hx

## 2024-01-03 NOTE — Patient Instructions (Signed)
 Substance Use Disorder: Care Instructions  Overview     You can improve your life and health by stopping your use of alcohol or drugs. When you don't drink or use drugs, you may feel and sleep better. You may get along better with your family, friends, and coworkers. There are medicines and programs that can help with substance use disorder.  How can you care for yourself at home?  Here are some ways to help you stay sober and prevent relapse.  If you have been given medicine to help keep you sober or reduce your cravings, be sure to take it exactly as prescribed.  Talk to your doctor about programs that can help you stop using drugs or drinking alcohol.  Do not keep alcohol or drugs in your home.  Plan ahead. Think about what you'll say if other people ask you to drink or use drugs. Try not to spend time with people who drink or use drugs.  Use the time and money spent on drinking or drugs to do something that's important to you.  Preventing a relapse  Have a plan to deal with relapse. Learn to recognize changes in your thinking that lead you to drink or use drugs. Get help before you start to drink or use drugs again.  Try to stay away from situations, friends, or places that may lead you to drink or use drugs.  If you feel the need to drink alcohol or use drugs again, seek help right away. Call a trusted friend or family member. Some people get support from organizations such as Narcotics Anonymous or [facility] [employer] or from treatment facilities.  If you relapse, get help as soon as you can. Some people make a plan with another person that outlines what they want that person to do for them if they relapse. The plan usually includes how to handle the relapse and who to notify in case of relapse.  Don't give up. Remember that a relapse doesn't mean that you have failed. Use the experience to learn the triggers that lead you to drink or use drugs. Then quit again. Recovery is a lifelong process. Many people have  several relapses before they are able to quit for good.  Follow-up care is a key part of your treatment and safety. Be sure to make and go to all appointments, and call your doctor if you are having problems. It's also a good idea to know your test results and keep a list of the medicines you take.  When should you call for help?   Call 911  anytime you think you may need emergency care. For example, call if you or someone else:    Has overdosed or has withdrawal signs. Be sure to tell the emergency workers that you are or someone else is using or trying to quit using drugs. Overdose or withdrawal signs may include:  Losing consciousness.  Seizure.  Seeing or hearing things that aren't there (hallucinations).     Is thinking or talking about suicide or harming others.   Where to get help 24 hours a day, 7 days a week   If you or someone you know talks about suicide, self-harm, a mental health crisis, a substance use crisis, or any other kind of emotional distress, get help right away. You can:    Call the Suicide and Crisis Lifeline at 61.     Call 1-800-273-TALK (4700905840).     Text HOME to (340)745-5120 to access the Crisis Text  Line.   Consider saving these numbers in your phone.  Go to 988lifeline.org for more information or to chat online.  Call your doctor now or seek immediate medical care if:    You are having withdrawal symptoms. These may include nausea or vomiting, sweating, shakiness, and anxiety.   Watch closely for changes in your health, and be sure to contact your doctor if:    You have a relapse.     You need more help or support to stop.   Where can you learn more?  Go to [employer].ca and enter H573 to learn more about "Substance Use Disorder: Care Instructions."  Current as of: May 09, 2023  Content Version: 14.4   2024-2025 [place], [place].   Care instructions adapted under license by [facility]. If you have questions about a medical condition or this  instruction, always ask your healthcare professional. Laren Player, [facility], disclaims any warranty or liability for your use of this information.         Learning About Vision Tests  What are vision tests?     The four most common vision tests are visual acuity tests, refraction, visual field tests, and color vision tests.  Visual acuity (sharpness) tests  These tests are used:  To see if you need glasses or contact lenses.  To monitor an eye problem.  To check an eye injury.  Visual acuity tests are done as part of routine exams. You may also have this test when you get your driver's license or apply for some types of jobs.  Visual field tests  These tests are used:  To check for vision loss in any area of your range of vision.  To screen for certain eye diseases.  To look for nerve damage after a stroke, head injury, or other problem that could reduce blood flow to the brain.  Refraction and color tests  A refraction test is done to find the right prescription for glasses and contact lenses.  A color vision test is done to check for color blindness.  Color vision is often tested as part of a routine exam. You may also have this test when you apply for a job where recognizing different colors is important, such as truck driving, Optician, dispensing, or the [employer].  How are vision tests done?  Visual acuity test   You cover one eye at a time.  You read aloud from a wall chart across the room.  You read aloud from a small card that you hold in your hand.  Refraction   You look into a special device.  The device puts lenses of different strengths in front of each eye to see how strong your glasses or contact lenses need to be.  Visual field tests   Your doctor may have you look through special machines.  Or your doctor may simply have you stare straight ahead while they move a finger into and out of your field of vision.  Color vision test   You look at pieces of printed test patterns in various colors. You say what  number or symbol you see.  Your doctor may have you trace the number or symbol using a pointer.  How do these tests feel?  There is very little chance of having a problem from this test. If dilating drops are used for a vision test, they may make the eyes sting and cause a medicine taste in the mouth.  Follow-up care is a key part of your  treatment and safety. Be sure to make and go to all appointments, and call your doctor if you are having problems. It's also a good idea to know your test results and keep a list of the medicines you take.  Where can you learn more?  Go to [employer] and enter G551 to learn more about "Learning About Vision Tests."  Current as of: April 19, 2023  Content Version: 14.4   2024-2025 [place], [place].   Care instructions adapted under license by [facility]. If you have questions about a medical condition or this instruction, always ask your healthcare professional. Larene Beach, [facility], disclaims any warranty or liability for your use of this information.         Advance Directives: Care Instructions  Overview  An advance directive is a legal way to state your wishes at the end of your life. It tells your family and your doctor what to do if you can't say what you want.  There are two main types of advance directives. You can change them any time your wishes change.  Living will.  This form tells your family and your doctor your wishes about life support and other treatment. The form is also called a declaration.  Medical power of attorney.  This form lets you name a person to make treatment decisions for you when you can't speak for yourself. This person is called a health care agent (health care proxy, health care surrogate). The form is also called a durable power of attorney for health care.  If you do not have an advance directive, decisions about your medical care may be made by a family member, or by a doctor or a judge who doesn't know  you.  It may help to think of an advance directive as a gift to the people who care for you. If you have one, they won't have to make tough decisions by themselves.  For more information, including forms for your state, see the [employer] website ([employer].cy).  Follow-up care is a key part of your treatment and safety. Be sure to make and go to all appointments, and call your doctor if you are having problems. It's also a good idea to know your test results and keep a list of the medicines you take.  What should you include in an advance directive?  Many states have a unique advance directive form. (It may ask you to address specific issues.) Or you might use a universal form that's approved by many states.  If your form doesn't tell you what to address, it may be hard to know what to include in your advance directive. Use the questions below to help you get started.  Who do you want to make decisions about your medical care if you are not able to?  What life-support measures do you want if you have a serious illness that gets worse over time or can't be cured?  What are you most afraid of that might happen? (Maybe you're afraid of having pain, losing your independence, or being kept alive by machines.)  Where would you prefer to die? (Your home? A hospital? A nursing home?)  Do you want to donate your organs when you die?  Do you want certain religious practices performed before you die?  When should you call for help?  Be sure to contact your doctor if you have any questions.  Where can you learn more?  Go to [employer] and enter R264 to learn more about "  Advance Directives: Care Instructions."  Current as of: November 18, 2022  Content Version: 14.4   2024-2025 [place], [place].   Care instructions adapted under license by [facility]. If you have questions about a medical condition or this instruction, always ask your healthcare professional.  Larene Beach, [facility], disclaims any warranty or liability for your use of this information.         A Healthy Heart: Care Instructions  Overview     Coronary artery disease, also called heart disease, occurs when a substance called plaque builds up in the vessels that supply oxygen-rich blood to your heart muscle. This can narrow the blood vessels and reduce blood flow. A heart attack happens when blood flow is completely blocked. A high-fat diet, smoking, and other factors increase the risk of heart disease.  Your doctor has found that you have a chance of having heart disease. A heart-healthy lifestyle can help keep your heart healthy and prevent heart disease. This lifestyle includes eating healthy, being active, staying at a weight that's healthy for you, and not smoking or using tobacco. It also includes taking medicines as directed, managing other health conditions, and trying to get a healthy amount of sleep.  Follow-up care is a key part of your treatment and safety. Be sure to make and go to all appointments, and call your doctor if you are having problems. It's also a good idea to know your test results and keep a list of the medicines you take.  How can you care for yourself at home?  Diet    Use less salt when you cook and eat. This helps lower your blood pressure. Taste food before salting. Add only a little salt when you think you need it. With time, your taste buds will adjust to less salt.     Eat fewer snack items, fast foods, canned soups, and other high-salt, high-fat, processed foods.     Read food labels and try to avoid saturated and trans fats. They increase your risk of heart disease by raising cholesterol levels.     Limit the amount of solid fat--butter, margarine, and shortening--you eat. Use olive, peanut, or canola oil when you cook. Bake, broil, and steam foods instead of frying them.     Eat a variety of fruit and vegetables every day. Dark green, deep orange, red, or yellow fruits  and vegetables are especially good for you. Examples include spinach, carrots, peaches, and berries.     Foods high in fiber can reduce your cholesterol and provide important vitamins and minerals. High-fiber foods include whole-grain cereals and breads, oatmeal, beans, brown rice, citrus fruits, and apples.     Eat lean proteins. Heart-healthy proteins include seafood, lean meats and poultry, eggs, beans, peas, nuts, seeds, and soy products.     Limit drinks and foods with added sugar. These include candy, desserts, and soda pop.   Heart-healthy lifestyle    If your doctor recommends it, get more exercise. For many people, walking is a good choice. Or you may want to swim, bike, or do other activities. Bit by bit, increase the time you're active every day. Try for at least 30 minutes on most days of the week.     Try to quit or cut back on using tobacco and other nicotine products. This includes smoking and vaping. If you need help quitting, talk to your doctor about stop-smoking programs and medicines. These can increase your chances of quitting for good. Quitting is one  of the most important things you can do to protect your heart. It is never too late to quit. Try to avoid secondhand smoke too.     Stay at a weight that's healthy for you. Talk to your doctor if you need help losing weight.     Try to get 7 to 9 hours of sleep each night.     Limit alcohol to 2 drinks a day for men and 1 drink a day for women. Too much alcohol can cause health problems.     Manage other health problems such as diabetes, high blood pressure, and high cholesterol. If you think you may have a problem with alcohol or drug use, talk to your doctor.   Medicines    Take your medicines exactly as prescribed. Call your doctor if you think you are having a problem with your medicine.     If your doctor recommends aspirin, take the amount directed each day. Make sure you take aspirin and not another kind of pain reliever, such as  acetaminophen (Tylenol).   When should you call for help?   Call 911 if you have symptoms of a heart attack. These may include:    Chest pain or pressure, or a strange feeling in the chest.     Sweating.     Shortness of breath.     Pain, pressure, or a strange feeling in the back, neck, jaw, or upper belly or in one or both shoulders or arms.     Lightheadedness or sudden weakness.     A fast or irregular heartbeat.   After you call 911, the operator may tell you to chew 1 adult-strength or 2 to 4 low-dose aspirin. Wait for an ambulance. Do not try to drive yourself.  Watch closely for changes in your health, and be sure to contact your doctor if you have any problems.  Where can you learn more?  Go to [employer].ca and enter F075 to learn more about "A Healthy Heart: Care Instructions."  Current as of: April 19, 2023  Content Version: 14.4   2024-2025 [place], [place].   Care instructions adapted under license by [facility]. If you have questions about a medical condition or this instruction, always ask your healthcare professional. Laren Player, [facility], disclaims any warranty or liability for your use of this information.    Personalized Preventive Plan for Lindsey Huynh - 01/03/2024  Medicare offers a range of preventive health benefits. Some of the tests and screenings are paid in full while other may be subject to a deductible, co-insurance, and/or copay.  Some of these benefits include a comprehensive review of your medical history including lifestyle, illnesses that may run in your family, and various assessments and screenings as appropriate.  After reviewing your medical record and screening and assessments performed today your provider may have ordered immunizations, labs, imaging, and/or referrals for you.  A list of these orders (if applicable) as well as your Preventive Care list are included within your After Visit Summary for your review.

## 2024-01-04 LAB — HEMOGLOBIN A1C
Estimated Avg Glucose: 166 mg/dL
Hemoglobin A1C: 7.4 % — ABNORMAL HIGH (ref 4.0–5.6)

## 2024-01-04 LAB — LIPID PANEL
Chol/HDL Ratio: 2.4 (ref 0.0–5.0)
Cholesterol, Total: 135 mg/dL (ref <200)
HDL: 57 mg/dL
LDL Cholesterol: 63.8 mg/dL (ref 0–100)
Triglycerides: 71 mg/dL (ref <150)
VLDL Cholesterol Calculated: 14.2 mg/dL

## 2024-01-04 NOTE — Other (Signed)
 Will send letter with results.    Unk Lightning, MD

## 2024-04-03 ENCOUNTER — Ambulatory Visit
Payer: Medicare (Managed Care) | Attending: Student in an Organized Health Care Education/Training Program | Primary: Student in an Organized Health Care Education/Training Program

## 2024-04-16 ENCOUNTER — Other Ambulatory Visit: Payer: Self-pay | Admitting: Physician Assistant

## 2024-05-16 ENCOUNTER — Other Ambulatory Visit: Payer: Self-pay | Admitting: Hematology and Oncology

## 2024-05-16 ENCOUNTER — Inpatient Hospital Stay: Payer: Medicare Other

## 2024-05-16 ENCOUNTER — Inpatient Hospital Stay: Payer: Medicare Other | Attending: Hematology and Oncology | Admitting: Hematology and Oncology

## 2024-05-16 VITALS — BP 138/78 | HR 67 | Temp 97.9°F | Resp 13 | Wt 142.7 lb

## 2024-05-16 DIAGNOSIS — Z8572 Personal history of non-Hodgkin lymphomas: Secondary | ICD-10-CM | POA: Diagnosis not present

## 2024-05-16 DIAGNOSIS — Z95828 Presence of other vascular implants and grafts: Secondary | ICD-10-CM | POA: Diagnosis not present

## 2024-05-16 DIAGNOSIS — C8253 Diffuse follicle center lymphoma, intra-abdominal lymph nodes: Secondary | ICD-10-CM

## 2024-05-16 DIAGNOSIS — Z08 Encounter for follow-up examination after completed treatment for malignant neoplasm: Secondary | ICD-10-CM | POA: Diagnosis present

## 2024-05-16 DIAGNOSIS — Z803 Family history of malignant neoplasm of breast: Secondary | ICD-10-CM | POA: Diagnosis not present

## 2024-05-16 DIAGNOSIS — Z9221 Personal history of antineoplastic chemotherapy: Secondary | ICD-10-CM | POA: Diagnosis not present

## 2024-05-16 DIAGNOSIS — Z808 Family history of malignant neoplasm of other organs or systems: Secondary | ICD-10-CM | POA: Diagnosis not present

## 2024-05-16 LAB — CMP (CANCER CENTER ONLY)
ALT: 16 U/L (ref 0–44)
AST: 19 U/L (ref 15–41)
Albumin: 3.9 g/dL (ref 3.5–5.0)
Alkaline Phosphatase: 75 U/L (ref 38–126)
Anion gap: 3 — ABNORMAL LOW (ref 5–15)
BUN: 9 mg/dL (ref 8–23)
CO2: 32 mmol/L (ref 22–32)
Calcium: 9 mg/dL (ref 8.9–10.3)
Chloride: 108 mmol/L (ref 98–111)
Creatinine: 0.87 mg/dL (ref 0.44–1.00)
GFR, Estimated: >60 mL/min (ref >60)
Glucose, Bld: 91 mg/dL (ref 70–99)
Potassium: 4 mmol/L (ref 3.5–5.1)
Sodium: 143 mmol/L (ref 135–145)
Total Bilirubin: 0.4 mg/dL (ref 0.0–1.2)
Total Protein: 6 g/dL — ABNORMAL LOW (ref 6.5–8.1)

## 2024-05-16 LAB — CBC WITH DIFFERENTIAL (CANCER CENTER ONLY)
Abs Immature Granulocytes: 0.01 K/uL (ref 0.00–0.07)
Basophils Absolute: 0.1 K/uL (ref 0.0–0.1)
Basophils Relative: 2 %
Eosinophils Absolute: 0.2 K/uL (ref 0.0–0.5)
Eosinophils Relative: 5 %
HCT: 44.7 % (ref 36.0–46.0)
Hemoglobin: 14.9 g/dL (ref 12.0–15.0)
Immature Granulocytes: 0 %
Lymphocytes Relative: 27 %
Lymphs Abs: 1.3 K/uL (ref 0.7–4.0)
MCH: 31.2 pg (ref 26.0–34.0)
MCHC: 33.3 g/dL (ref 30.0–36.0)
MCV: 93.7 fL (ref 80.0–100.0)
Monocytes Absolute: 0.4 K/uL (ref 0.1–1.0)
Monocytes Relative: 8 %
Neutro Abs: 2.7 K/uL (ref 1.7–7.7)
Neutrophils Relative %: 58 %
Platelet Count: 189 K/uL (ref 150–400)
RBC: 4.77 MIL/uL (ref 3.87–5.11)
RDW: 12.6 % (ref 11.5–15.5)
WBC Count: 4.6 K/uL (ref 4.0–10.5)
nRBC: 0 % (ref 0.0–0.2)

## 2024-05-16 LAB — LACTATE DEHYDROGENASE: LDH: 139 U/L (ref 98–192)

## 2024-05-16 NOTE — Progress Notes (Signed)
 [facility] [phone]   [phone]  PROGRESS NOTE  Patient Care Team: Janey Santos, MD as PCP - General (Internal Medicine) Vernetta Lonni GRADE, MD as Consulting Physician (Orthopedic Surgery)  Hematological/Oncological History # Non-Hodgkin B Cell Lymphoma, Follicular Lymphoma. Stage III 1) 02/25/2020: patient underwent excision of a 1.3 cm mobile nodule on the left vertex of the scalp. 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  2) 03/18/2020: Establish care with Dr. Federico  3) 03/30/2020: PET CT scan showed  involvement of the left scalp, left axillary lymph node, left hilar and infrahilar nodes, subcarinal node, and mesenteric lymph nodes, as well as a masslike appearance/ high metabolic activity in the lower uterus/cervix.  4) 04/21/2020: biopsy of the mass in the lower uterus/cervix consistent with follicular lymphoma.  5) 06/25/2020: CT C/A/P showed the soft tissue mass in the abdomen increase in size from 5.8 x 3.6 cm to 6.5 x 5.7 cm. The other lymphadenopathy was stable. Patient elected to move forward with treatment.  6) 07/06/2020: Cycle 1 Day 1 of Rituximab /Bendamustine  7) 07/17/2020-07/22/2020: Admitted to [facility] with fevers. Infectious workup negative, but scans show mesenteric panniculitis (fibrosing mesenteritis). Started on prednisone  40mg  PO daily.  8) 08/06/2020-08/27/2020: Weekly Rituximab  monotherapy.  9) 09/28/2020: PET CT scan shows no residual disease. Complete response to therapy noted.  10) 10/23/2020: start of rituximab  q 8 weeks for consolidation.  11) 12/25/2020: dose 2 of q 8 week rituximab   12) 02/26/2021: dose 3 of q 8 week rituximab   13) 04/28/2021: dose 4 of q 8 week rituximab   14) 05/26/2022: CT chest abdomen pelvis showed no evidence of residual or recurrent disease.  Interval History:  Alyssa Elliott 73 y.o. female with medical history significant for  diffuse non Hodgkin B cell lymphoma who presents for a follow up visit. The patient's last visit was on 11/16/2023. In the interim since her last visit she has had no major changes in her health. She presents today for a routine surveillance visit.   On exam today Alyssa Elliott reports she has had no major problems in the room since her last visit.  She reports that she and her husband will be going to the beach next month.  She has had no issues with runny nose, sore throat, cough.  She denies any fevers, chills, sweats.  In the interim since her last visit her husband did have COVID but she did not.  She has not had any bumps or lumps concerning for lymphadenopathy.  Her weight has been steady with no major ups or downs.  She is currently up to 142 pounds, which is 5 pounds up from our last visit.  She reports that her ideal weight is 133 pounds.  Her appetite is strong and her energy levels are good.  She has no additional questions concerns or complaints today.  She has no signs concerning for recurrence of her follicular lymphoma.  A full 10 point ROS is otherwise negative.  MEDICAL HISTORY:  Past Medical History:  Diagnosis Date   Allergy     Anxiety    Asthma    Cataract 2018   removed   Complication of anesthesia    drop in BP   DDD (degenerative disc disease)    Depression    GERD (gastroesophageal reflux disease)    Hemorrhoids    Hyperlipidemia    Lymphoma (HCC) dx 2021   no tx for now   Vitamin D deficiency  SURGICAL HISTORY: Past Surgical History:  Procedure Laterality Date   ATRIAL ABLATION SURGERY  1997   SVT   cataract surgery Bilateral 2017   COLONOSCOPY  2016   FOOT NEUROMA SURGERY     multiple times bilaterally   IR IMAGING GUIDED PORT INSERTION  07/13/2020   IR REMOVAL TUN ACCESS W/ PORT W/O FL MOD SED  06/04/2021   PLANTAR FASCIA RELEASE Left 1999 or 2000   SHOULDER ARTHROSCOPY Left 04/19/2019   Procedure: LEFT SHOULDER MANIPULATION UNDER ANESTHESIA AND  ARTHROSCOPY WITH EXTENSIVE DEBRIDEMENT;  Surgeon: Vernetta Lonni GRADE, MD;  Location: [facility];  Service: Orthopedics;  Laterality: Left;   UPPER GI ENDOSCOPY  yrs ago    SOCIAL HISTORY: Social History   Socioeconomic History   Marital status: Married    Spouse name: Not on file   Number of children: 0   Years of education: Not on file   Highest education level: Not on file  Occupational History   Occupation: retired Financial controller  Tobacco Use   Smoking status: Never   Smokeless tobacco: Never  Vaping Use   Vaping status: Never Used  Substance and Sexual Activity   Alcohol use: No   Drug use: No   Sexual activity: Not Currently    Partners: Male    Birth control/protection: Post-menopausal  Other Topics Concern   Not on file  Social History Narrative   Not on file   Social Drivers of Health   Financial Resource Strain: Not on file  Food Insecurity: Not on file  Transportation Needs: Not on file  Physical Activity: Not on file  Stress: Not on file  Social Connections: Unknown (02/01/2022)   [facility]   Social Network    Social Network: Not on file  Intimate Partner Violence: Unknown (12/24/2021)   Received from [facility]   HITS    Physically Hurt: Not on file    Insult or Talk Down To: Not on file    Threaten Physical Harm: Not on file    Scream or Curse: Not on file    FAMILY HISTORY: Family History  Problem Relation Age of Onset   Heart disease Mother    Diabetes Mother    Tuberculosis Paternal Grandfather    Bone cancer Paternal Grandfather    Breast cancer Sister 3   Melanoma Sister    Cirrhosis Maternal Grandfather    Heart failure Paternal Grandmother    Colon cancer Neg Hx    Ovarian cancer Neg Hx    Uterine cancer Neg Hx     ALLERGIES:  is allergic to bupropion, codeine, levofloxacin, penicillins, keflex  [cephalexin ], latex, and sulfa antibiotics.  MEDICATIONS:  Current Outpatient Medications  Medication Sig  Dispense Refill   ARIPiprazole  (ABILIFY ) 2 MG tablet Take 2 mg by mouth daily.  (Patient not taking: Reported on 08/10/2023)     aspirin  81 MG tablet Take 81 mg by mouth at bedtime.      esomeprazole  (NEXIUM ) 40 MG capsule TAKE 1 CAPSULE (40 MG TOTAL) BY MOUTH 2 (TWO) TIMES DAILY BEFORE A MEAL. 180 capsule 1   linaclotide  (LINZESS ) 72 MCG capsule Take 1 capsule (72 mcg total) by mouth daily before breakfast. (Patient not taking: Reported on 11/16/2023) 30 capsule 3   Melatonin 5 MG TABS Take 20 mg by mouth at bedtime.      meloxicam (MOBIC) 15 MG tablet Take 1 tablet by mouth daily as needed.     PARoxetine  (PAXIL ) 40 MG tablet Take  40 mg by mouth daily.      Rosuvastatin Calcium 20 MG CPSP 1 tablet     No current facility-administered medications for this visit.   Facility-Administered Medications Ordered in Other Visits  Medication Dose Route Frequency Provider Last Rate Last Admin   0.9 %  sodium chloride  infusion   Intravenous Continuous Boisseau, Hayley, PA        REVIEW OF SYSTEMS:   Constitutional: ( - ) fevers, ( - )  chills , ( - ) night sweats Eyes: ( - ) blurriness of vision, ( - ) double vision, ( - ) watery eyes Ears, nose, mouth, throat, and face: ( - ) mucositis, ( - ) sore throat Respiratory: ( - ) cough, ( - ) dyspnea, ( - ) wheezes Cardiovascular: ( - ) palpitation, ( - ) chest discomfort, ( - ) lower extremity swelling Gastrointestinal:  ( - ) nausea, ( - ) heartburn, ( - ) change in bowel habits Skin: ( - ) abnormal skin rashes Lymphatics: ( - ) new lymphadenopathy, ( - ) easy bruising Neurological: ( - ) numbness, ( - ) tingling, ( - ) new weaknesses Behavioral/Psych: ( - ) mood change, ( - ) new changes  All other systems were reviewed with the patient and are negative.  PHYSICAL EXAMINATION: GENERAL: well appearing elderly Caucasian female. alert, no distress and comfortable SKIN: scalp lesion reduced to a concave dent in scalp. skin color, texture, turgor are  normal, no rashes or significant lesions EYES: conjunctiva are pink and non-injected, sclera clear LYMPH:  no palpable lymphadenopathy in the cervical, axillary or supraclavicular LUNGS: clear to auscultation and percussion with normal breathing effort HEART: regular rate & rhythm and no murmurs and no lower extremity edema Musculoskeletal: no cyanosis of digits and no clubbing  PSYCH: alert & oriented x 3, fluent speech NEURO: no focal motor/sensory deficits  LABORATORY DATA:  I have reviewed the data as listed    Latest Ref Rng & Units 05/16/2024   10:23 AM 11/16/2023    2:29 PM 05/11/2023    3:25 PM  CBC  WBC 4.0 - 10.5 K/uL 4.6  5.1  5.7   Hemoglobin 12.0 - 15.0 g/dL 85.0  85.3  85.9   Hematocrit 36.0 - 46.0 % 44.7  44.5  42.9   Platelets 150 - 400 K/uL 189  191  180        Latest Ref Rng & Units 05/16/2024   10:23 AM 11/16/2023    2:29 PM 05/11/2023    3:25 PM  CMP  Glucose 70 - 99 mg/dL 91  896  863   BUN 8 - 23 mg/dL 9  12  15    Creatinine 0.44 - 1.00 mg/dL 9.12  9.14  9.08   Sodium 135 - 145 mmol/L 143  142  142   Potassium 3.5 - 5.1 mmol/L 4.0  3.8  3.9   Chloride 98 - 111 mmol/L 108  107  108   CO2 22 - 32 mmol/L 32  32  28   Calcium 8.9 - 10.3 mg/dL 9.0  8.9  8.9   Total Protein 6.5 - 8.1 g/dL 6.0  6.1  6.1   Total Bilirubin 0.0 - 1.2 mg/dL 0.4  0.4  0.4   Alkaline Phos 38 - 126 U/L 75  89  86   AST 15 - 41 U/L 19  19  25    ALT 0 - 44 U/L 16  15  32  No results found for: MPROTEIN No results found for: KPAFRELGTCHN, LAMBDASER, KAPLAMBRATIO   RADIOGRAPHIC STUDIES:  No results found.  ASSESSMENT & PLAN Alyssa Elliott 73  y.o. female with medical history significant for diffuse non Hodgkin B cell lymphoma who presents for a follow up visit.  After review of the imaging, review the labs, discussion with the patient her findings are most consistent with a diffuse non-Hodgkin B-cell lymphoma, follicular lymphoma.  The initial diagnosis based off the  biopsy from the lesion on the head was that of a follicle cell lymphoma which tends to be a cutaneous lymphoma, however this diagnosis was considerably less likely given the diffuse spread throughout the lymph nodes of the chest and abdomen. Biopsy of the FDG avid lesion in the pelvis resulted with follicular lymphoma.   Previously we discussed the diagnosis of follicular lymphoma and the options moving forward.  She currently does not meet any GELF Criteria and therefore is not required to start therapy.  We discussed that treatment could be started if she so desired and we discussed the risks and benefits of bendamustine  rituximab  therapy.  She voiced her understanding of the different options between starting treatment and continued observation.  After discussion with her husband she noted she would like to start treatment given the continued growth of the lesion which was abutting the urinary bladder.   The regimen of choice for this patient is Rituximab  monotherapy with Rituximab  375mg /m2 IV weekly x 4 weeks, followed by IV rituximab  q 8 weeks thereafter. This is being administered with curative intent. We transitioned to this regimen from bendamustine  rituximab  due to intolerance and hospitalization following her first cycle of treatment.  # Non-Hodgkin B Cell Lymphoma, Follicular Lymphoma. Stage III --pre treatment CT findings showed lymphadenopathy on both sides of the diaphragm. Consistent with at least a Stage III lymphoma -- confirmed the diagnosis with biopsy of the FDG avid mass near the uterus/cervix. Cancel the left axillary lymph node biopsy --started treatment with R-Benda, however the patient ended up hospitalized due to fever, weakness, and marked drop in her labs.  She did not tolerate rituximab  and bendamustine  combination altogether and therefore we proceeded with rituximab  monotherapy alone. She completed Rituximab  weekly x 4 weeks.  Plan:  --CT imaging as needed moving forward.   -- Patient has completed maintenance rituximab  --labs today show WBC 4.6, Hgb 14.9, MCV 93.7, Plt 189.  Creatinine LFTs and LDH all within normal limits --RTC in 12 months time  # Leukopenia-resolved. -- Continue to monitor  #Fibrosing Mesenteritis (Mesenteric Panniculitis), resolved --improved with prednisone  taper, complete resolution of symptoms.  --completed steroid taper.  --continue to monitor   #Supportive Care --port removed --zofran  8mg  q8H PRN and compazine  10mg  PO q6H for nausea -- EMLA  cream for port -- no pain medication required at this time.   No orders of the defined types were placed in this encounter.  All questions were answered. The patient knows to call the clinic with any problems, questions or concerns.  A total of more than 30 minutes were spent on this encounter and over half of that time was spent on counseling and coordination of care as outlined above.   Norleen IVAR Kidney, MD Department of Hematology/Oncology [facility] at [facility] [place] Phone: [phone] Pager: [phone] Email: [email or web address]  05/16/2024 7:46 PM

## 2024-06-26 ENCOUNTER — Ambulatory Visit
Payer: Medicare (Managed Care) | Attending: Student in an Organized Health Care Education/Training Program | Primary: Student in an Organized Health Care Education/Training Program

## 2024-06-27 NOTE — Telephone Encounter (Signed)
"  Spoke with patient and made aware. Will keep already schedule appointment.    Bernarda Lunger, LPN    "

## 2024-06-27 NOTE — Telephone Encounter (Signed)
"-----   Message from Dr. Heide Cowing, MD sent at 06/24/2024 12:54 PM EDT -----  Regarding: DMV form  Patient requested DMV form to be completed. She has not been seen since 01/03/24 and I do not know for what reason the form needs to be completed. It requires a more recent visit.  She is already scheduled for 07/25/24 - if that date is fine, we can address it then. If it is not sufficient, please schedule patient for an office extended visit at next available.    Thanks,    Heide Cowing, MD  "

## 2024-07-25 ENCOUNTER — Ambulatory Visit
Payer: Medicare (Managed Care) | Attending: Student in an Organized Health Care Education/Training Program | Primary: Student in an Organized Health Care Education/Training Program

## 2024-08-21 ENCOUNTER — Ambulatory Visit
Admit: 2024-08-21 | Payer: Medicare (Managed Care) | Attending: Student in an Organized Health Care Education/Training Program | Primary: Student in an Organized Health Care Education/Training Program

## 2024-08-21 VITALS — BP 134/82 | HR 55 | Temp 99.30000°F | Resp 14 | Ht 64.0 in | Wt 172.0 lb

## 2024-08-21 DIAGNOSIS — I130 Hypertensive heart and chronic kidney disease with heart failure and stage 1 through stage 4 chronic kidney disease, or unspecified chronic kidney disease: Secondary | ICD-10-CM

## 2024-08-21 DIAGNOSIS — I5022 Chronic systolic (congestive) heart failure: Principal | ICD-10-CM

## 2024-08-21 MED ORDER — EMPAGLIFLOZIN 25 MG PO TABS
25 | ORAL_TABLET | Freq: Every day | ORAL | 3 refills | 90.00000 days | Status: AC
Start: 2024-08-21 — End: ?

## 2024-08-21 MED ORDER — GABAPENTIN 300 MG PO CAPS
300 | ORAL_CAPSULE | Freq: Every evening | ORAL | 1 refills | 30.00000 days | Status: AC | PRN
Start: 2024-08-21 — End: 2025-02-17

## 2024-08-21 NOTE — Progress Notes (Signed)
 "Lindsey Huynh (DOB:  08-29-1951) is a 73 y.o. female, Established patient, here for evaluation of the following chief complaint(s):  3 Month Follow-Up        Subjective   SUBJECTIVE/OBJECTIVE:  Patient presents today for follow-up and medicare wellness    Wrist fracture with bowling - doing better - seeing ortho    Chronic problems:    Type 2 Diabetes Mellitus  - Home BG checks: Does not check frequently, they run between 90-120  - Home medications include: Jardiance  10 mg daily   - Taking medications as prescribed without side effects  - Diet: Reviewed over diet, discussed controlling carb intake, decreasing intake of bread, pasta, rice, potatoes, corn and sugary drinks/items  - Exercise: Encouraged regular exercise - 150 minutes/week  - ROS: No vision changes, changes in urination, numbness/tingling  - Preventative care:  - Last HgbA1c: 7.4 on 01/03/24  - Last urine micro: 10/17/23 - elevated   - Last eye exam was: 01/25/23   - Last foot exam was : 01/03/24  - Statin: Atorvastatin  80 mg daily  - ACE-I/ARB: Entresto   - Pneumonia vaccine: 06/04/2016    She want Gabapentin  for neuropathy but does not use it regularly  Last fill 04/09/24 for 90 tablets    Hypertension  - BP today 134/82  - Sees cardiology - she is on carvedilol , Entresto , spironolactone     Hyperlipidemia  - Last panel 01/03/24 - LDL 63.8 - patient on Lipitor  80 mg daily and Zetia 10 mg daily    Heart Failure/Coronary Artery Disease - Sees Cardiologist with [facility] - Dr. Murrell and Vernell Cleverly NP  - Patient is on ASA 81 mg, Coreg  6.25 mg BID  - She is also on Entresto  49-51 BID and Aldactone  25 mg daily    CKD2 - sees Nephrologist is Dr. Zachary Saxon, MD - eGFR of 66 on 10/17/23    Vitamin D deficiency - ON vitamin D being managed by nephrologist    Preventative care:  Health Maintenance Due   Topic Date Due    Diabetic Alb to Cr ratio (uACR) test  Never done    Diabetic retinal exam  Never done    DTaP/Tdap/Td vaccine (1 - Tdap) Never done    Colorectal  Cancer Screen  Never done    Shingles vaccine (1 of 2) Never done    GFR test (Diabetes, CKD 3-4, OR last GFR 15-59)  01/25/2024    Respiratory Syncytial Virus (RSV) Pregnant or age 41 yrs+ (1 - 1-dose 75+ series) Never done    COVID-19 Vaccine (3 - 2025-26 season) 05/20/2024      Had normal colonoscopy about 5 years ago in [place] - states she is good for 10 years    Past Medical History:   Diagnosis Date    Arthritis     CAD (coronary artery disease)     Cardiac arrest (HCC) 11/12/2020    Formatting of this note might be different from the original.   Added automatically from request for surgery [phone]  Formatting of this note might be different from the original.   Formatting of this note might be different from the original.   Added automatically from request for surgery [id number]      Last Assessment & Plan:    Formatting of this note might be different from the original.   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   -History of right femoral-popliteal bypass in June 2023   -History of left femoropopliteal bypass in 2021 by Dr. Vara    GERD (gastroesophageal reflux disease)     Hypercholesterolemia     Hypertension     Ischemic hepatitis 10/29/2020    Formatting of this note might be different from the original.   Had been downtrending in the hospital. Likely 2/2 her PEA arrest      Last Assessment & Plan:    Formatting of this note might be different from the original.   Hepatic panel on 2/11: Alt 156, alk phos 177   CMP on 2/16: ALT 56, alk phos 143   Both down trending   -Recheck CMP on 12/16/20  Formatting of this note might be different fro    PAD (peripheral artery disease)     Pneumonia due to COVID-19 virus 10/29/2020    Formatting of this note might be different from the  original.   She is s/p dexamethasone  and remdesevir.      Last Assessment & Plan:    Formatting of this note might be different from the original.   Encouraged patient to get her booster. She stated she would call [facility] now and get it scheduled    Pneumonia due to COVID-19 virus 10/29/2020    Formatting of this note might be different from the original.   Formatting of this note might be different from the original.   She is s/p dexamethasone  and remdesevir.      Last Assessment & Plan:    Formatting of this note might be different from the original.   Encouraged patient to get her booster. She stated she would call [facility] now and get it scheduled    Stage 2 acute kidney injury 10/29/2020    Last Assessment & Plan:    Formatting of this note might be different from the original.   Lasix today to keep negative   AKI resolving  Formatting of this note might be different from the original.   Last Assessment & Plan:    Formatting of this note might be different from the original.   Lasix today to keep negative   AKI resolving      Last Assessment & Plan:    Formatting of this note might b     Past Surgical History:   Procedure Laterality Date    COLONOSCOPY  2019    CORONARY ARTERY BYPASS GRAFT  12/25/2020    x2, [facility]     CORONARY ARTERY BYPASS GRAFT  12/25/2020    EYE SURGERY Right     CATARACT REMOVAL    FEMORAL BYPASS Left 11/28/2019     Dr Earvin    FEMORAL BYPASS Right 02/16/2022    RIGHT FEMORAL POPLITEAL BYPASS WITH PTFE, ANGIOPLASTY OF POPLITEAL AND TIBIAL ARTERIES performed by Danelle LITTIE Earvin, MD at [facility] MAIN OR    FOOT AMPUTATION Left 12/05/2019    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 Diamantina    HYSTERECTOMY (CERVIX STATUS UNKNOWN)  1983    ORTHOPEDIC SURGERY  06-30-10    back surgery (tumor removed)    TUMOR REMOVAL  06/30/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      Family History   Problem Relation Age of  Onset    Heart Disease Mother         CHF    High Blood Pressure Mother     High Blood  Pressure Father     Diabetes Father     Heart Disease Sister     Heart Attack Sister     Cancer Sister         THYROID    Seizures Brother     Anesth Problems Neg Hx      Social History     Socioeconomic History    Marital status: Single     Spouse name: Not on file    Number of children: Not on file    Years of education: Not on file    Highest education level: Not on file   Occupational History    Not on file   Tobacco Use    Smoking status: Former     Current packs/day: 0.00     Average packs/day: 0.5 packs/day for 10.0 years (5.0 ttl pk-yrs)     Types: Cigarettes     Start date: 49     Quit date: 2003     Years since quitting: 22.9     Passive exposure: Current    Smokeless tobacco: Never   Vaping Use    Vaping status: Never Used   Substance and Sexual Activity    Alcohol use: Yes     Alcohol/week: 3.0 standard drinks of alcohol     Types: 1 Cans of beer, 2 Shots of liquor per week     Comment: SOCIALLY    Drug use: No    Sexual activity: Yes     Partners: Male   Other Topics Concern    Not on file   Social History Narrative    Not on file     Social Drivers of Health     Financial Resource Strain: Low Risk  (03/01/2022)    Overall Financial Resource Strain (CARDIA)     Difficulty of Paying Living Expenses: Not hard at all   Food Insecurity: No Food Insecurity (01/03/2024)    Hunger Vital Sign     Worried About Running Out of Food in the Last Year: Never true     Ran Out of Food in the Last Year: Never true   Transportation Needs: No Transportation Needs (01/03/2024)    PRAPARE - Therapist, Art (Medical): No     Lack of Transportation (Non-Medical): No   Physical Activity: Insufficiently Active (01/02/2024)    Exercise Vital Sign     Days of Exercise per Week: 7 days     Minutes of Exercise per Session: 20 min   Stress: Not on file   Social Connections: Not on file   Intimate Partner Violence: Not on file   Housing Stability: Low Risk  (01/03/2024)    Housing Stability Vital Sign      Unable to Pay for Housing in the Last Year: No     Number of Times Moved in the Last Year: 0     Homeless in the Last Year: No       Current Outpatient Medications:     empagliflozin  (JARDIANCE ) 25 MG tablet, Take 1 tablet by mouth daily, Disp: 90 tablet, Rfl: 3    gabapentin  (NEURONTIN ) 300 MG capsule, Take 1 capsule by mouth nightly as needed (Neuropathy) for up to 180 days. Max Daily Amount: 300 mg, Disp: 90 capsule, Rfl: 1  pantoprazole  (PROTONIX ) 20 MG tablet, Take 1 tablet by mouth daily, Disp: , Rfl:     ezetimibe (ZETIA) 10 MG tablet, Take 1 tablet by mouth daily, Disp: , Rfl:     spironolactone  (ALDACTONE ) 50 MG tablet, Take 0.5 tablets by mouth daily, Disp: 90 tablet, Rfl: 3    ergocalciferol (ERGOCALCIFEROL) 1.25 MG (50000 UT) capsule, Take 1 capsule by mouth once a week, Disp: , Rfl:     atorvastatin  (LIPITOR ) 80 MG tablet, Take 1 tablet by mouth Daily with supper, Disp: , Rfl:     carvedilol  (COREG ) 6.25 MG tablet, Take 1 tablet by mouth 2 times daily (with meals), Disp: , Rfl:     sacubitril -valsartan  (ENTRESTO ) 49-51 MG per tablet, Take 1 tablet by mouth 2 times daily, Disp: , Rfl:     furosemide (LASIX) 20 MG tablet, Take 1 tablet by mouth as needed WEIGHT GAIN, Disp: , Rfl:     aspirin  81 MG EC tablet, Take by mouth daily, Disp: , Rfl:   No Known Allergies    Review of Systems   Constitutional:  Negative for chills and fever.   Respiratory:  Negative for cough and shortness of breath.    Cardiovascular:  Negative for chest pain and palpitations.   Gastrointestinal:  Negative for abdominal pain, blood in stool, constipation, diarrhea, nausea and vomiting.   Musculoskeletal:  Positive for arthralgias. Negative for myalgias.   Neurological:  Positive for numbness. Negative for dizziness and headaches.   Psychiatric/Behavioral:  Negative for dysphoric mood and sleep disturbance. The patient is not nervous/anxious.           Objective   Vitals:    08/21/24 1102 08/21/24 1112   BP: (!) 148/76 134/82    BP Site: Right Upper Arm Left Upper Arm   Patient Position: Sitting Sitting   BP Cuff Size: Medium Adult Large Adult   Pulse: 55    Resp: 14    Temp: 99.3 F (37.4 C)    TempSrc: Oral    SpO2: 99%    Weight: 78 kg (172 lb)    Height: 1.626 m (5' 4)         Physical Exam  Vitals and nursing note reviewed.   Constitutional:       General: She is not in acute distress.     Appearance: Normal appearance. She is not ill-appearing.   HENT:      Mouth/Throat:      Mouth: Mucous membranes are moist.      Pharynx: Oropharynx is clear.   Cardiovascular:      Rate and Rhythm: Regular rhythm.      Pulses:           Dorsalis pedis pulses are 2+ on the right side and 2+ on the left side.      Heart sounds: No murmur heard.  Pulmonary:      Effort: Pulmonary effort is normal.      Breath sounds: Normal breath sounds. No wheezing.   Abdominal:      General: Bowel sounds are normal. There is no distension.      Palpations: Abdomen is soft.      Tenderness: There is no abdominal tenderness.   Musculoskeletal:      Right lower leg: No edema.      Left lower leg: No edema.      Right foot: No deformity.      Left foot: No deformity.      Left Lower Extremity:  Left leg is amputated below ankle.   Feet:      Right foot:      Protective Sensation: 8 sites tested.  8 sites sensed.      Skin integrity: Dry skin present. No skin breakdown or callus.      Toenail Condition: Right toenails are abnormally thick. Fungal disease present.     Left foot:      Protective Sensation: 4 sites tested.  4 sites sensed.      Skin integrity: Dry skin present. No skin breakdown or callus.      Comments:    Left foot: normal sensation, s/p toe amputation (all 5 toes)   Right foot: normal sensation  Neurological:      Mental Status: She is alert.   Psychiatric:         Mood and Affect: Mood normal.         Behavior: Behavior normal.                Assessment & Plan   ASSESSMENT/PLAN:  1. Chronic systolic heart failure (HCC)  -     Magnesium; Future  -      Comprehensive Metabolic Panel; Future  2. Primary hypertension  -     Magnesium; Future  -     Comprehensive Metabolic Panel; Future  3. Arteriosclerosis of coronary artery  Overview:  Last Assessment & Plan:    Formatting of this note might be different from the original.   S/p CABGx2 LIMA to LAD and SVG to OM on 4/8 fast track extubated and weaned off vasoactive gtts immediately post op   - POD1 TTE showing severe reduction in RV function so epi was started and diuresis increased   - AV wires and CT all out on 4/12   - FG even to -500 with lasix 20mg    - cont midodrine 5q8   - asa 162, plavix 75   - will add home coreg  when off midodrine  Formatting of this note might be different from the original.   Last Assessment & Plan:    Formatting of this note might be different from the original.   S/p CABGx2 LIMA to LAD and SVG to OM on 4/8 fast track extubated and weaned off vasoactive gtts immediately post op   - POD1 TTE showing severe reduction in RV function so epi was started and diuresis increased   - AV wires and CT all out on 4/12   - FG even to -500 with lasix 20mg    - cont midodrine 5q8   - asa 162, plavix 75   - will add home coreg  when off midodrine    4. PVD (peripheral vascular disease)  5. Status post amputation of left foot (HCC)  6. Hypercholesterolemia  7. Stage 2 chronic kidney disease  -     Comprehensive Metabolic Panel; Future  8. Type 2 diabetes mellitus with diabetic nephropathy, without long-term current use of insulin  (HCC)  -     empagliflozin  (JARDIANCE ) 25 MG tablet; Take 1 tablet by mouth daily, Disp-90 tablet, R-3Normal  -     Albumin/Creatinine Ratio, Urine; Future  -     Hemoglobin A1C; Future  -     Comprehensive Metabolic Panel; Future  9. Type 2 diabetes mellitus with diabetic neuropathy, without long-term current use of insulin  (HCC)  -     empagliflozin  (JARDIANCE ) 25 MG tablet; Take 1 tablet by mouth daily, Disp-90 tablet, R-3Normal  -  gabapentin  (NEURONTIN ) 300 MG capsule;  Take 1 capsule by mouth nightly as needed (Neuropathy) for up to 180 days. Max Daily Amount: 300 mg, Disp-90 capsule, R-1Normal  -     Albumin/Creatinine Ratio, Urine; Future  -     Hemoglobin A1C; Future  -     Comprehensive Metabolic Panel; Future  10. Vitamin D deficiency  -     Vitamin D 25 Hydroxy; Future           Patient presents today for follow-up for:    CHF: Chronic and stable. Patient follows with cardiology. Continue medications per cardiology.  Hypertension: Chronic and relatively stable. Continue current medications given that she sees cardiology. BP have been good at home. Follow-u pin 3 months.  CAD: Chronic and stable. Patient follows with cardiology. Continue medications per cardiology.  PVD: Chronic and stable. Patient follows with cardiology. Continue medications per cardiology.  Absence of L forefoot - noted  Hyperlipidemia: Chronic and stable. Lifestyle/dietary changes reviewed. Continue current medications: atorvastatin  80 mg daily. Last lipid panel reviewed. Patient is not due at this time.  CKD2: Chronic and stable. Patient follows with nephrology. Labs done recently. Avoid nephrotoxic medications. Follow-up pRN  Type 2 diabetes mellitus: Chronic and controlled  Last A1c: 7.4 repeat today  Reviewed over diet/lifestyle  Medications:   Current: Metformin  1000 mg daily with Jardiance  10 mg daily  Changes: None  Preventative care:  Urine microalbumin: 10/17/23 - repeat today  Foot exam: Done today 01/03/24  Pneumonia vaccine: Done 2017  Eye exam: encouraged patient to schedule with eye doctor  Lipid panel: Ordered today   Statin therapy: Atorvastatin  80 mg daily  ACE-I/ARB: Entresto   Follow-up: 3 months  Diabetic neuropathy: Chronic and stable. Patient is on Gabapentin  nightly as needed. PDMP reviewed and consistent. Refill sent in.   Vitamin D deficiency: Chronic and stable.  Sees nephrologist who manages. Follow-up PRN        Return in about 6 months (around 02/19/2025) for Medicare Wellness.          An electronic signature was used to authenticate this note.    --Heide Cowing, MD   "

## 2024-08-21 NOTE — Progress Notes (Signed)
"  Rm 14    Chief Complaint   Patient presents with    3 Month Follow-Up         Have you been to the ER, urgent care clinic since your last visit?  Hospitalized since your last visit?    NO    Have you seen or consulted any other health care providers outside of [facility] since your last visit?    NO        Have you had a colorectal cancer screening such as a colonoscopy/FIT/Cologuard?    NO    No colonoscopy on file  No cologuard on file  No FIT/FOBT on file   No flexible sigmoidoscopy on file         Click Here for Release of Records Request           01/02/2024     9:46 AM   PHQ-9    Little interest or pleasure in doing things 0    Feeling down, depressed, or hopeless 0    PHQ-2 Score 0     PHQ-9 Total Score 0         Proxy-reported    Data saved with a previous flowsheet row definition           Financial Resource Strain: Low Risk  (03/01/2022)    Overall Financial Resource Strain (CARDIA)     Difficulty of Paying Living Expenses: Not hard at all      Food Insecurity: No Food Insecurity (01/03/2024)    Hunger Vital Sign     Worried About Running Out of Food in the Last Year: Never true     Ran Out of Food in the Last Year: Never true          Health Maintenance Due   Topic Date Due    Diabetic Alb to Cr ratio (uACR) test  Never done    Diabetic retinal exam  Never done    DTaP/Tdap/Td vaccine (1 - Tdap) Never done    Colorectal Cancer Screen  Never done    Shingles vaccine (1 of 2) Never done    GFR test (Diabetes, CKD 3-4, OR last GFR 15-59)  01/25/2024    Respiratory Syncytial Virus (RSV) Pregnant or age 76 yrs+ (1 - 1-dose 75+ series) Never done    Flu vaccine (1) Never done    COVID-19 Vaccine (3 - 2025-26 season) 05/20/2024       "

## 2024-08-22 LAB — COMPREHENSIVE METABOLIC PANEL
ALT: 23 U/L (ref 10–35)
AST: 18 U/L (ref 10–35)
Albumin/Globulin Ratio: 1.3 (ref 1.1–2.2)
Albumin: 3.6 g/dL (ref 3.5–5.2)
Alk Phosphatase: 179 U/L — ABNORMAL HIGH (ref 35–104)
Anion Gap: 11 mmol/L (ref 2–14)
BUN/Creatinine Ratio: 26 — ABNORMAL HIGH (ref 12–20)
BUN: 26 mg/dL — ABNORMAL HIGH (ref 8–23)
CO2: 24 mmol/L (ref 20–29)
Calcium: 10.2 mg/dL (ref 8.8–10.2)
Chloride: 104 mmol/L (ref 98–107)
Creatinine: 0.98 mg/dL (ref 0.60–1.00)
Est, Glom Filt Rate: 60 ml/min/1.73m2 (ref >59)
Globulin: 2.8 g/dL (ref 2.0–4.0)
Glucose: 167 mg/dL — ABNORMAL HIGH (ref 65–100)
Potassium: 4.5 mmol/L (ref 3.5–5.1)
Sodium: 139 mmol/L (ref 136–145)
Total Bilirubin: 0.2 mg/dL (ref 0.0–1.2)
Total Protein: 6.4 g/dL (ref 6.4–8.3)

## 2024-08-22 LAB — HEMOGLOBIN A1C
Estimated Avg Glucose: 187 mg/dL
Hemoglobin A1C: 8.2 % — ABNORMAL HIGH (ref 4.0–5.6)

## 2024-08-22 LAB — ALBUMIN/CREATININE RATIO, URINE
Albumin Urine: 4.38 mg/dL
Albumin/Creatinine Ratio: 34 mg/g — ABNORMAL HIGH (ref 0–30)
Creatinine, Ur: 128 mg/dL (ref 28.00–217.00)

## 2024-08-22 LAB — VITAMIN D 25 HYDROXY: Vit D, 25-Hydroxy: 21.5 ng/mL — ABNORMAL LOW (ref 30.0–100.0)

## 2024-08-22 LAB — MAGNESIUM: Magnesium: 2 mg/dL (ref 1.6–2.4)

## 2024-08-22 NOTE — Result Encounter Note (Signed)
 Will send letter with results.    Unk Lightning, MD

## 2024-09-18 NOTE — Care Coordination (Signed)
"  Ambulatory Care Coordination Note     09/18/2024 2:07 PM     Patient outreach attempt by this ACM today to offer care management services. ACM was unable to reach the patient by telephone today;   left voice message requesting a return phone call to this ACM.     ACM: Ricki Vanhandel S Krystn Dermody, RN     Care Summary Note:     PCP/Specialist follow up:   Future Appointments         Provider Specialty Dept Phone    02/19/2025 10:30 AM Lucillie Pickerel, MD Internal Medicine [phone]            Follow Up:   Plan for next ACM outreach in approximately 1 week to complete:  - outreach attempt to offer care management services.             "

## 2024-10-03 NOTE — Care Coordination (Signed)
"  Ambulatory Care Coordination Note     10/03/2024 2:41 PM     Patient outreach attempt by this ACM today to perform care management follow up . ACM was unable to reach the patient by telephone today;   left voice message requesting a return phone call to this ACM.     ACM: Lindsey Huynh S Heman Que, RN     Care Summary Note: DM/CHF    PCP/Specialist follow up:   Future Appointments         Provider Specialty Dept Phone    02/19/2025 10:30 AM Lucillie Pickerel, MD Internal Medicine [phone]            Follow Up:   Plan for next ACM outreach in approximately 2 weeks to complete:  - outreach attempt to offer care management services.             "

## 2025-05-19 ENCOUNTER — Other Ambulatory Visit

## 2025-05-19 ENCOUNTER — Ambulatory Visit: Admitting: Hematology and Oncology
